# Patient Record
Sex: Male | Born: 1955
Health system: Southern US, Community
[De-identification: ages and names within clinical notes are randomized; demographics above are authoritative.]

## PROBLEM LIST (undated history)

## (undated) DIAGNOSIS — F32A Depression, unspecified: Secondary | ICD-10-CM

## (undated) DIAGNOSIS — F329 Major depressive disorder, single episode, unspecified: Secondary | ICD-10-CM

## (undated) DIAGNOSIS — N401 Enlarged prostate with lower urinary tract symptoms: Secondary | ICD-10-CM

## (undated) DIAGNOSIS — N179 Acute kidney failure, unspecified: Secondary | ICD-10-CM

## (undated) DIAGNOSIS — B1920 Unspecified viral hepatitis C without hepatic coma: Secondary | ICD-10-CM

## (undated) DIAGNOSIS — I639 Cerebral infarction, unspecified: Secondary | ICD-10-CM

## (undated) DIAGNOSIS — K8020 Calculus of gallbladder without cholecystitis without obstruction: Secondary | ICD-10-CM

## (undated) DIAGNOSIS — E119 Type 2 diabetes mellitus without complications: Secondary | ICD-10-CM

## (undated) DIAGNOSIS — Z227 Latent tuberculosis: Secondary | ICD-10-CM

## (undated) DIAGNOSIS — I10 Essential (primary) hypertension: Secondary | ICD-10-CM

## (undated) DIAGNOSIS — J449 Chronic obstructive pulmonary disease, unspecified: Secondary | ICD-10-CM

## (undated) DIAGNOSIS — I739 Peripheral vascular disease, unspecified: Secondary | ICD-10-CM

## (undated) DIAGNOSIS — C61 Malignant neoplasm of prostate: Secondary | ICD-10-CM

## (undated) DIAGNOSIS — B20 Human immunodeficiency virus [HIV] disease: Secondary | ICD-10-CM

## (undated) DIAGNOSIS — Z21 Asymptomatic human immunodeficiency virus [HIV] infection status: Secondary | ICD-10-CM

## (undated) DIAGNOSIS — R937 Abnormal findings on diagnostic imaging of other parts of musculoskeletal system: Secondary | ICD-10-CM

## (undated) DIAGNOSIS — I2699 Other pulmonary embolism without acute cor pulmonale: Secondary | ICD-10-CM

## (undated) DIAGNOSIS — R82998 Other abnormal findings in urine: Secondary | ICD-10-CM

## (undated) HISTORY — DX: Calculus of gallbladder without cholecystitis without obstruction: K80.20

## (undated) HISTORY — DX: Acute kidney failure, unspecified: N17.9

## (undated) HISTORY — DX: Peripheral vascular disease, unspecified: I73.9

## (undated) HISTORY — PX: HERNIA REPAIR: SHX51

## (undated) HISTORY — DX: Type 2 diabetes mellitus without complications: E11.9

## (undated) HISTORY — DX: Other abnormal findings in urine: R82.998

## (undated) HISTORY — DX: Other pulmonary embolism without acute cor pulmonale: I26.99

## (undated) HISTORY — DX: Latent tuberculosis: Z22.7

---
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
## 1986-07-20 DIAGNOSIS — Z227 Latent tuberculosis: Secondary | ICD-10-CM

## 1986-07-20 HISTORY — DX: Latent tuberculosis: Z22.7

## 2003-01-23 ENCOUNTER — Encounter: Payer: Self-pay | Admitting: Internal Medicine

## 2003-01-23 ENCOUNTER — Encounter: Admission: RE | Admit: 2003-01-23 | Discharge: 2003-01-23 | Payer: Self-pay | Admitting: Internal Medicine

## 2003-01-23 ENCOUNTER — Encounter (INDEPENDENT_AMBULATORY_CARE_PROVIDER_SITE_OTHER): Payer: Self-pay | Admitting: *Deleted

## 2003-01-23 ENCOUNTER — Ambulatory Visit (HOSPITAL_COMMUNITY): Admission: RE | Admit: 2003-01-23 | Discharge: 2003-01-23 | Payer: Self-pay | Admitting: Internal Medicine

## 2003-01-23 LAB — CONVERTED CEMR LAB: CD4 T Cell Abs: 590

## 2003-02-13 ENCOUNTER — Encounter: Admission: RE | Admit: 2003-02-13 | Discharge: 2003-02-13 | Payer: Self-pay | Admitting: Internal Medicine

## 2003-05-01 ENCOUNTER — Ambulatory Visit (HOSPITAL_COMMUNITY): Admission: RE | Admit: 2003-05-01 | Discharge: 2003-05-01 | Payer: Self-pay | Admitting: Internal Medicine

## 2003-05-01 ENCOUNTER — Encounter: Admission: RE | Admit: 2003-05-01 | Discharge: 2003-05-01 | Payer: Self-pay | Admitting: Internal Medicine

## 2003-05-01 ENCOUNTER — Encounter: Payer: Self-pay | Admitting: Internal Medicine

## 2003-05-15 ENCOUNTER — Encounter: Admission: RE | Admit: 2003-05-15 | Discharge: 2003-05-15 | Payer: Self-pay | Admitting: Internal Medicine

## 2003-07-31 ENCOUNTER — Encounter: Admission: RE | Admit: 2003-07-31 | Discharge: 2003-07-31 | Payer: Self-pay | Admitting: Internal Medicine

## 2003-08-14 ENCOUNTER — Encounter: Admission: RE | Admit: 2003-08-14 | Discharge: 2003-08-14 | Payer: Self-pay | Admitting: Internal Medicine

## 2003-09-04 ENCOUNTER — Inpatient Hospital Stay (HOSPITAL_COMMUNITY): Admission: EM | Admit: 2003-09-04 | Discharge: 2003-09-08 | Payer: Self-pay | Admitting: *Deleted

## 2003-09-11 ENCOUNTER — Encounter: Admission: RE | Admit: 2003-09-11 | Discharge: 2003-09-11 | Payer: Self-pay | Admitting: Internal Medicine

## 2003-09-25 ENCOUNTER — Encounter: Admission: RE | Admit: 2003-09-25 | Discharge: 2003-09-25 | Payer: Self-pay | Admitting: Internal Medicine

## 2003-10-04 ENCOUNTER — Encounter: Admission: RE | Admit: 2003-10-04 | Discharge: 2003-10-04 | Payer: Self-pay | Admitting: Internal Medicine

## 2003-11-15 ENCOUNTER — Encounter: Admission: RE | Admit: 2003-11-15 | Discharge: 2003-11-15 | Payer: Self-pay | Admitting: Internal Medicine

## 2003-11-15 ENCOUNTER — Ambulatory Visit (HOSPITAL_COMMUNITY): Admission: RE | Admit: 2003-11-15 | Discharge: 2003-11-15 | Payer: Self-pay | Admitting: Internal Medicine

## 2003-12-04 ENCOUNTER — Encounter: Admission: RE | Admit: 2003-12-04 | Discharge: 2003-12-04 | Payer: Self-pay | Admitting: Internal Medicine

## 2004-02-19 ENCOUNTER — Ambulatory Visit (HOSPITAL_COMMUNITY): Admission: RE | Admit: 2004-02-19 | Discharge: 2004-02-19 | Payer: Self-pay | Admitting: Internal Medicine

## 2004-02-19 ENCOUNTER — Encounter: Admission: RE | Admit: 2004-02-19 | Discharge: 2004-02-19 | Payer: Self-pay | Admitting: Internal Medicine

## 2004-03-04 ENCOUNTER — Encounter: Admission: RE | Admit: 2004-03-04 | Discharge: 2004-03-04 | Payer: Self-pay | Admitting: Internal Medicine

## 2004-05-20 ENCOUNTER — Ambulatory Visit (HOSPITAL_COMMUNITY): Admission: RE | Admit: 2004-05-20 | Discharge: 2004-05-20 | Payer: Self-pay | Admitting: Internal Medicine

## 2004-05-20 ENCOUNTER — Ambulatory Visit: Payer: Self-pay | Admitting: Internal Medicine

## 2004-07-23 ENCOUNTER — Ambulatory Visit: Payer: Self-pay | Admitting: Internal Medicine

## 2004-08-05 ENCOUNTER — Ambulatory Visit (HOSPITAL_COMMUNITY): Admission: RE | Admit: 2004-08-05 | Discharge: 2004-08-05 | Payer: Self-pay | Admitting: Internal Medicine

## 2004-08-05 ENCOUNTER — Ambulatory Visit: Payer: Self-pay | Admitting: Internal Medicine

## 2004-10-21 ENCOUNTER — Ambulatory Visit: Payer: Self-pay | Admitting: Internal Medicine

## 2004-10-21 ENCOUNTER — Ambulatory Visit (HOSPITAL_COMMUNITY): Admission: RE | Admit: 2004-10-21 | Discharge: 2004-10-21 | Payer: Self-pay | Admitting: Internal Medicine

## 2004-11-04 ENCOUNTER — Ambulatory Visit: Payer: Self-pay | Admitting: Internal Medicine

## 2005-03-31 ENCOUNTER — Ambulatory Visit (HOSPITAL_COMMUNITY): Admission: RE | Admit: 2005-03-31 | Discharge: 2005-03-31 | Payer: Self-pay | Admitting: Internal Medicine

## 2005-03-31 ENCOUNTER — Encounter (INDEPENDENT_AMBULATORY_CARE_PROVIDER_SITE_OTHER): Payer: Self-pay | Admitting: *Deleted

## 2005-03-31 ENCOUNTER — Ambulatory Visit: Payer: Self-pay | Admitting: Internal Medicine

## 2005-03-31 LAB — CONVERTED CEMR LAB: CD4 Count: 540 microliters

## 2005-04-14 ENCOUNTER — Ambulatory Visit: Payer: Self-pay | Admitting: Internal Medicine

## 2005-05-20 ENCOUNTER — Ambulatory Visit: Payer: Self-pay | Admitting: Internal Medicine

## 2005-10-06 ENCOUNTER — Encounter: Admission: RE | Admit: 2005-10-06 | Discharge: 2005-10-06 | Payer: Self-pay | Admitting: Internal Medicine

## 2005-10-06 ENCOUNTER — Ambulatory Visit: Payer: Self-pay | Admitting: Internal Medicine

## 2005-10-06 ENCOUNTER — Encounter (INDEPENDENT_AMBULATORY_CARE_PROVIDER_SITE_OTHER): Payer: Self-pay | Admitting: *Deleted

## 2005-10-06 LAB — CONVERTED CEMR LAB: HIV 1 RNA Quant: 399 copies/mL

## 2006-04-12 ENCOUNTER — Encounter (INDEPENDENT_AMBULATORY_CARE_PROVIDER_SITE_OTHER): Payer: Self-pay | Admitting: *Deleted

## 2006-04-12 ENCOUNTER — Ambulatory Visit: Payer: Self-pay | Admitting: Internal Medicine

## 2006-04-12 ENCOUNTER — Encounter: Admission: RE | Admit: 2006-04-12 | Discharge: 2006-04-12 | Payer: Self-pay | Admitting: Internal Medicine

## 2006-04-27 ENCOUNTER — Ambulatory Visit: Payer: Self-pay | Admitting: Internal Medicine

## 2006-04-27 DIAGNOSIS — F17210 Nicotine dependence, cigarettes, uncomplicated: Secondary | ICD-10-CM | POA: Insufficient documentation

## 2006-04-27 DIAGNOSIS — M1990 Unspecified osteoarthritis, unspecified site: Secondary | ICD-10-CM | POA: Insufficient documentation

## 2006-04-27 DIAGNOSIS — A150 Tuberculosis of lung: Secondary | ICD-10-CM | POA: Insufficient documentation

## 2006-04-27 DIAGNOSIS — M5137 Other intervertebral disc degeneration, lumbosacral region: Secondary | ICD-10-CM | POA: Insufficient documentation

## 2006-04-27 DIAGNOSIS — F339 Major depressive disorder, recurrent, unspecified: Secondary | ICD-10-CM

## 2006-04-27 DIAGNOSIS — I10 Essential (primary) hypertension: Secondary | ICD-10-CM | POA: Insufficient documentation

## 2006-04-27 DIAGNOSIS — B182 Chronic viral hepatitis C: Secondary | ICD-10-CM

## 2006-04-27 DIAGNOSIS — B20 Human immunodeficiency virus [HIV] disease: Secondary | ICD-10-CM | POA: Insufficient documentation

## 2006-04-27 DIAGNOSIS — F1910 Other psychoactive substance abuse, uncomplicated: Secondary | ICD-10-CM

## 2006-07-22 ENCOUNTER — Encounter: Payer: Self-pay | Admitting: Internal Medicine

## 2006-09-13 ENCOUNTER — Encounter (INDEPENDENT_AMBULATORY_CARE_PROVIDER_SITE_OTHER): Payer: Self-pay | Admitting: *Deleted

## 2006-09-13 LAB — CONVERTED CEMR LAB

## 2006-09-26 ENCOUNTER — Encounter (INDEPENDENT_AMBULATORY_CARE_PROVIDER_SITE_OTHER): Payer: Self-pay | Admitting: *Deleted

## 2006-10-21 ENCOUNTER — Telehealth: Payer: Self-pay | Admitting: Internal Medicine

## 2006-10-26 ENCOUNTER — Ambulatory Visit: Payer: Self-pay | Admitting: Internal Medicine

## 2006-10-26 ENCOUNTER — Encounter: Admission: RE | Admit: 2006-10-26 | Discharge: 2006-10-26 | Payer: Self-pay | Admitting: Internal Medicine

## 2006-11-02 DIAGNOSIS — A158 Other respiratory tuberculosis: Secondary | ICD-10-CM | POA: Insufficient documentation

## 2006-11-09 ENCOUNTER — Ambulatory Visit: Payer: Self-pay | Admitting: Internal Medicine

## 2006-11-09 LAB — CONVERTED CEMR LAB: Blood Glucose, Fingerstick: 100

## 2006-11-23 ENCOUNTER — Telehealth: Payer: Self-pay | Admitting: Internal Medicine

## 2006-12-08 ENCOUNTER — Telehealth: Payer: Self-pay | Admitting: Internal Medicine

## 2006-12-22 ENCOUNTER — Telehealth: Payer: Self-pay | Admitting: Internal Medicine

## 2007-01-20 ENCOUNTER — Telehealth: Payer: Self-pay | Admitting: Internal Medicine

## 2007-02-09 ENCOUNTER — Telehealth: Payer: Self-pay | Admitting: Internal Medicine

## 2007-03-14 ENCOUNTER — Telehealth: Payer: Self-pay | Admitting: Internal Medicine

## 2007-04-15 ENCOUNTER — Telehealth: Payer: Self-pay | Admitting: Internal Medicine

## 2007-05-03 ENCOUNTER — Telehealth: Payer: Self-pay | Admitting: Internal Medicine

## 2007-05-16 ENCOUNTER — Telehealth: Payer: Self-pay | Admitting: Internal Medicine

## 2007-05-31 ENCOUNTER — Encounter: Admission: RE | Admit: 2007-05-31 | Discharge: 2007-05-31 | Payer: Self-pay | Admitting: Internal Medicine

## 2007-05-31 ENCOUNTER — Ambulatory Visit: Payer: Self-pay | Admitting: Internal Medicine

## 2007-05-31 ENCOUNTER — Encounter: Payer: Self-pay | Admitting: Infectious Diseases

## 2007-05-31 LAB — CONVERTED CEMR LAB
ALT: 39 units/L (ref 0–53)
AST: 54 units/L — ABNORMAL HIGH (ref 0–37)
Creatinine, Ser: 0.83 mg/dL (ref 0.40–1.50)
HIV 1 RNA Quant: 815 copies/mL — ABNORMAL HIGH (ref <50)
Hgb A1c MFr Bld: 6.3 %
Total Bilirubin: 0.4 mg/dL (ref 0.3–1.2)

## 2007-06-14 ENCOUNTER — Telehealth (INDEPENDENT_AMBULATORY_CARE_PROVIDER_SITE_OTHER): Payer: Self-pay | Admitting: Pharmacy Technician

## 2007-06-14 ENCOUNTER — Telehealth: Payer: Self-pay | Admitting: Internal Medicine

## 2007-06-14 ENCOUNTER — Ambulatory Visit: Payer: Self-pay | Admitting: Internal Medicine

## 2007-06-14 DIAGNOSIS — G4700 Insomnia, unspecified: Secondary | ICD-10-CM | POA: Insufficient documentation

## 2007-06-22 ENCOUNTER — Telehealth: Payer: Self-pay | Admitting: Internal Medicine

## 2007-07-12 ENCOUNTER — Telehealth: Payer: Self-pay | Admitting: Internal Medicine

## 2007-07-20 ENCOUNTER — Encounter (INDEPENDENT_AMBULATORY_CARE_PROVIDER_SITE_OTHER): Payer: Self-pay | Admitting: *Deleted

## 2007-07-27 ENCOUNTER — Ambulatory Visit: Payer: Self-pay | Admitting: Internal Medicine

## 2007-07-27 ENCOUNTER — Encounter: Admission: RE | Admit: 2007-07-27 | Discharge: 2007-07-27 | Payer: Self-pay | Admitting: Internal Medicine

## 2007-07-27 LAB — CONVERTED CEMR LAB
Albumin: 4.1 g/dL (ref 3.5–5.2)
Alkaline Phosphatase: 119 units/L — ABNORMAL HIGH (ref 39–117)
BUN: 11 mg/dL (ref 6–23)
Cholesterol: 147 mg/dL (ref 0–200)
Glucose, Bld: 105 mg/dL — ABNORMAL HIGH (ref 70–99)
HDL: 46 mg/dL (ref >39)
Hemoglobin: 13.6 g/dL (ref 13.0–17.0)
LDL Cholesterol: 55 mg/dL (ref 0–99)
MCHC: 33.2 g/dL (ref 30.0–36.0)
MCV: 86.3 fL (ref 78.0–100.0)
Potassium: 3.4 meq/L — ABNORMAL LOW (ref 3.5–5.3)
RBC: 4.75 M/uL (ref 4.22–5.81)
Triglycerides: 230 mg/dL — ABNORMAL HIGH (ref <150)

## 2007-08-11 ENCOUNTER — Telehealth: Payer: Self-pay | Admitting: Internal Medicine

## 2007-08-15 ENCOUNTER — Telehealth: Payer: Self-pay | Admitting: Internal Medicine

## 2007-08-16 ENCOUNTER — Ambulatory Visit: Payer: Self-pay | Admitting: Internal Medicine

## 2007-09-12 ENCOUNTER — Telehealth: Payer: Self-pay | Admitting: Internal Medicine

## 2007-10-13 ENCOUNTER — Encounter (INDEPENDENT_AMBULATORY_CARE_PROVIDER_SITE_OTHER): Payer: Self-pay | Admitting: *Deleted

## 2007-10-17 ENCOUNTER — Telehealth (INDEPENDENT_AMBULATORY_CARE_PROVIDER_SITE_OTHER): Payer: Self-pay | Admitting: *Deleted

## 2007-10-24 ENCOUNTER — Ambulatory Visit: Payer: Self-pay | Admitting: Internal Medicine

## 2007-10-24 ENCOUNTER — Encounter: Admission: RE | Admit: 2007-10-24 | Discharge: 2007-10-24 | Payer: Self-pay | Admitting: Internal Medicine

## 2007-10-24 LAB — CONVERTED CEMR LAB
Alkaline Phosphatase: 98 units/L (ref 39–117)
BUN: 12 mg/dL (ref 6–23)
CO2: 21 meq/L (ref 19–32)
Creatinine, Ser: 0.82 mg/dL (ref 0.40–1.50)
Eosinophils Absolute: 0.4 10*3/uL (ref 0.0–0.7)
Eosinophils Relative: 6 % — ABNORMAL HIGH (ref 0–5)
Glucose, Bld: 106 mg/dL — ABNORMAL HIGH (ref 70–99)
HCT: 39.7 % (ref 39.0–52.0)
HIV 1 RNA Quant: 634 copies/mL — ABNORMAL HIGH (ref <50)
HIV-1 RNA Quant, Log: 2.8 — ABNORMAL HIGH (ref <1.70)
Hemoglobin: 13.3 g/dL (ref 13.0–17.0)
Lymphs Abs: 3 10*3/uL (ref 0.7–4.0)
MCV: 85.2 fL (ref 78.0–100.0)
Monocytes Absolute: 0.6 10*3/uL (ref 0.1–1.0)
Monocytes Relative: 10 % (ref 3–12)
Platelets: 210 10*3/uL (ref 150–400)
RBC: 4.66 M/uL (ref 4.22–5.81)
Total Bilirubin: 0.4 mg/dL (ref 0.3–1.2)
WBC: 6 10*3/uL (ref 4.0–10.5)

## 2007-11-02 ENCOUNTER — Encounter (INDEPENDENT_AMBULATORY_CARE_PROVIDER_SITE_OTHER): Payer: Self-pay | Admitting: *Deleted

## 2007-11-14 ENCOUNTER — Telehealth (INDEPENDENT_AMBULATORY_CARE_PROVIDER_SITE_OTHER): Payer: Self-pay | Admitting: *Deleted

## 2007-11-15 ENCOUNTER — Ambulatory Visit: Payer: Self-pay | Admitting: Internal Medicine

## 2007-11-17 ENCOUNTER — Telehealth (INDEPENDENT_AMBULATORY_CARE_PROVIDER_SITE_OTHER): Payer: Self-pay | Admitting: *Deleted

## 2007-12-14 ENCOUNTER — Telehealth (INDEPENDENT_AMBULATORY_CARE_PROVIDER_SITE_OTHER): Payer: Self-pay | Admitting: *Deleted

## 2008-01-09 ENCOUNTER — Telehealth (INDEPENDENT_AMBULATORY_CARE_PROVIDER_SITE_OTHER): Payer: Self-pay | Admitting: *Deleted

## 2008-01-11 ENCOUNTER — Encounter: Admission: RE | Admit: 2008-01-11 | Discharge: 2008-01-11 | Payer: Self-pay | Admitting: Internal Medicine

## 2008-01-11 ENCOUNTER — Ambulatory Visit: Payer: Self-pay | Admitting: Internal Medicine

## 2008-01-11 LAB — CONVERTED CEMR LAB
AST: 35 units/L (ref 0–37)
Albumin: 4 g/dL (ref 3.5–5.2)
Alkaline Phosphatase: 70 units/L (ref 39–117)
BUN: 14 mg/dL (ref 6–23)
Calcium: 9.4 mg/dL (ref 8.4–10.5)
Chloride: 106 meq/L (ref 96–112)
Glucose, Bld: 95 mg/dL (ref 70–99)
Hemoglobin: 11.6 g/dL — ABNORMAL LOW (ref 13.0–17.0)
Potassium: 3.7 meq/L (ref 3.5–5.3)
RBC: 3.93 M/uL — ABNORMAL LOW (ref 4.22–5.81)
RDW: 18.9 % — ABNORMAL HIGH (ref 11.5–15.5)
Sodium: 140 meq/L (ref 135–145)
Total Protein: 7.3 g/dL (ref 6.0–8.3)

## 2008-01-24 ENCOUNTER — Ambulatory Visit: Payer: Self-pay | Admitting: Internal Medicine

## 2008-01-24 DIAGNOSIS — M79609 Pain in unspecified limb: Secondary | ICD-10-CM

## 2008-03-13 ENCOUNTER — Telehealth (INDEPENDENT_AMBULATORY_CARE_PROVIDER_SITE_OTHER): Payer: Self-pay | Admitting: *Deleted

## 2008-04-05 ENCOUNTER — Telehealth (INDEPENDENT_AMBULATORY_CARE_PROVIDER_SITE_OTHER): Payer: Self-pay | Admitting: *Deleted

## 2008-04-09 ENCOUNTER — Telehealth (INDEPENDENT_AMBULATORY_CARE_PROVIDER_SITE_OTHER): Payer: Self-pay | Admitting: *Deleted

## 2008-04-16 ENCOUNTER — Emergency Department (HOSPITAL_COMMUNITY): Admission: EM | Admit: 2008-04-16 | Discharge: 2008-04-16 | Payer: Self-pay | Admitting: Emergency Medicine

## 2008-04-24 ENCOUNTER — Ambulatory Visit: Payer: Self-pay | Admitting: Internal Medicine

## 2008-04-24 LAB — CONVERTED CEMR LAB: HIV-1 RNA Quant, Log: <1.7 (ref <1.70)

## 2008-04-25 ENCOUNTER — Ambulatory Visit: Payer: Self-pay | Admitting: Internal Medicine

## 2008-04-25 ENCOUNTER — Ambulatory Visit (HOSPITAL_COMMUNITY): Admission: RE | Admit: 2008-04-25 | Discharge: 2008-04-25 | Payer: Self-pay | Admitting: Internal Medicine

## 2008-04-25 DIAGNOSIS — F528 Other sexual dysfunction not due to a substance or known physiological condition: Secondary | ICD-10-CM

## 2008-04-26 LAB — CONVERTED CEMR LAB: Testosterone: 827.56 ng/dL (ref 350–890)

## 2008-05-03 ENCOUNTER — Telehealth: Payer: Self-pay | Admitting: Internal Medicine

## 2008-05-10 ENCOUNTER — Telehealth (INDEPENDENT_AMBULATORY_CARE_PROVIDER_SITE_OTHER): Payer: Self-pay | Admitting: *Deleted

## 2008-05-17 ENCOUNTER — Ambulatory Visit: Payer: Self-pay | Admitting: Internal Medicine

## 2008-05-22 ENCOUNTER — Encounter (INDEPENDENT_AMBULATORY_CARE_PROVIDER_SITE_OTHER): Payer: Self-pay | Admitting: Licensed Clinical Social Worker

## 2008-05-24 ENCOUNTER — Ambulatory Visit: Payer: Self-pay | Admitting: Internal Medicine

## 2008-05-24 LAB — CONVERTED CEMR LAB
ALT: 23 units/L (ref 0–53)
AST: 28 units/L (ref 0–37)
Albumin: 3.8 g/dL (ref 3.5–5.2)
Alkaline Phosphatase: 74 units/L (ref 39–117)
Glucose, Bld: 87 mg/dL (ref 70–99)
Potassium: 3.5 meq/L (ref 3.5–5.3)
Sodium: 140 meq/L (ref 135–145)
Total Bilirubin: 0.4 mg/dL (ref 0.3–1.2)
Total Protein: 7.7 g/dL (ref 6.0–8.3)

## 2008-05-25 ENCOUNTER — Ambulatory Visit (HOSPITAL_COMMUNITY): Admission: RE | Admit: 2008-05-25 | Discharge: 2008-05-25 | Payer: Self-pay | Admitting: Internal Medicine

## 2008-05-30 ENCOUNTER — Telehealth (INDEPENDENT_AMBULATORY_CARE_PROVIDER_SITE_OTHER): Payer: Self-pay | Admitting: *Deleted

## 2008-06-11 ENCOUNTER — Telehealth (INDEPENDENT_AMBULATORY_CARE_PROVIDER_SITE_OTHER): Payer: Self-pay | Admitting: *Deleted

## 2008-06-12 ENCOUNTER — Telehealth: Payer: Self-pay | Admitting: Internal Medicine

## 2008-06-28 ENCOUNTER — Telehealth (INDEPENDENT_AMBULATORY_CARE_PROVIDER_SITE_OTHER): Payer: Self-pay | Admitting: *Deleted

## 2008-06-29 ENCOUNTER — Telehealth (INDEPENDENT_AMBULATORY_CARE_PROVIDER_SITE_OTHER): Payer: Self-pay | Admitting: *Deleted

## 2008-07-04 ENCOUNTER — Ambulatory Visit (HOSPITAL_COMMUNITY): Admission: RE | Admit: 2008-07-04 | Discharge: 2008-07-04 | Payer: Self-pay | Admitting: Internal Medicine

## 2008-07-04 ENCOUNTER — Ambulatory Visit: Payer: Self-pay | Admitting: Internal Medicine

## 2008-07-09 ENCOUNTER — Telehealth (INDEPENDENT_AMBULATORY_CARE_PROVIDER_SITE_OTHER): Payer: Self-pay | Admitting: *Deleted

## 2008-07-31 ENCOUNTER — Telehealth (INDEPENDENT_AMBULATORY_CARE_PROVIDER_SITE_OTHER): Payer: Self-pay | Admitting: *Deleted

## 2008-08-09 ENCOUNTER — Ambulatory Visit: Payer: Self-pay | Admitting: Internal Medicine

## 2008-08-13 ENCOUNTER — Telehealth: Payer: Self-pay | Admitting: Internal Medicine

## 2008-08-15 ENCOUNTER — Telehealth (INDEPENDENT_AMBULATORY_CARE_PROVIDER_SITE_OTHER): Payer: Self-pay | Admitting: *Deleted

## 2008-09-06 ENCOUNTER — Telehealth (INDEPENDENT_AMBULATORY_CARE_PROVIDER_SITE_OTHER): Payer: Self-pay | Admitting: *Deleted

## 2008-10-09 ENCOUNTER — Telehealth (INDEPENDENT_AMBULATORY_CARE_PROVIDER_SITE_OTHER): Payer: Self-pay | Admitting: *Deleted

## 2008-10-23 ENCOUNTER — Telehealth (INDEPENDENT_AMBULATORY_CARE_PROVIDER_SITE_OTHER): Payer: Self-pay | Admitting: *Deleted

## 2008-10-29 ENCOUNTER — Ambulatory Visit: Payer: Self-pay | Admitting: Internal Medicine

## 2008-10-29 LAB — CONVERTED CEMR LAB
Alkaline Phosphatase: 78 units/L (ref 39–117)
BUN: 10 mg/dL (ref 6–23)
Basophils Absolute: 0 10*3/uL (ref 0.0–0.1)
CO2: 22 meq/L (ref 19–32)
Cholesterol: 132 mg/dL (ref 0–200)
Creatinine, Ser: 0.86 mg/dL (ref 0.40–1.50)
GFR calc Af Amer: >60 mL/min (ref >60)
GFR calc non Af Amer: >60 mL/min (ref >60)
Glucose, Bld: 124 mg/dL — ABNORMAL HIGH (ref 70–99)
HCT: 34 % — ABNORMAL LOW (ref 39.0–52.0)
HDL: 34 mg/dL — ABNORMAL LOW (ref >39)
HIV-1 RNA Quant, Log: 1.86 — ABNORMAL HIGH (ref <1.68)
Hemoglobin: 11.5 g/dL — ABNORMAL LOW (ref 13.0–17.0)
Lymphocytes Relative: 47 % — ABNORMAL HIGH (ref 12–46)
Lymphs Abs: 3.1 10*3/uL (ref 0.7–4.0)
Monocytes Absolute: 0.5 10*3/uL (ref 0.1–1.0)
Neutro Abs: 2.7 10*3/uL (ref 1.7–7.7)
RDW: 14.6 % (ref 11.5–15.5)
Sodium: 139 meq/L (ref 135–145)
Total Bilirubin: 0.3 mg/dL (ref 0.3–1.2)
Total Protein: 7.3 g/dL (ref 6.0–8.3)
Triglycerides: 110 mg/dL (ref <150)
VLDL: 22 mg/dL (ref 0–40)
WBC: 6.7 10*3/uL (ref 4.0–10.5)

## 2008-10-30 ENCOUNTER — Encounter (INDEPENDENT_AMBULATORY_CARE_PROVIDER_SITE_OTHER): Payer: Self-pay | Admitting: *Deleted

## 2008-11-05 ENCOUNTER — Telehealth (INDEPENDENT_AMBULATORY_CARE_PROVIDER_SITE_OTHER): Payer: Self-pay | Admitting: *Deleted

## 2008-11-20 ENCOUNTER — Encounter (INDEPENDENT_AMBULATORY_CARE_PROVIDER_SITE_OTHER): Payer: Self-pay | Admitting: *Deleted

## 2008-11-26 ENCOUNTER — Telehealth (INDEPENDENT_AMBULATORY_CARE_PROVIDER_SITE_OTHER): Payer: Self-pay | Admitting: *Deleted

## 2008-12-24 ENCOUNTER — Telehealth (INDEPENDENT_AMBULATORY_CARE_PROVIDER_SITE_OTHER): Payer: Self-pay | Admitting: *Deleted

## 2008-12-25 ENCOUNTER — Ambulatory Visit: Payer: Self-pay | Admitting: Internal Medicine

## 2008-12-25 DIAGNOSIS — L989 Disorder of the skin and subcutaneous tissue, unspecified: Secondary | ICD-10-CM | POA: Insufficient documentation

## 2008-12-25 LAB — CONVERTED CEMR LAB
Bilirubin Urine: NEGATIVE
GFR calc Af Amer: >60 mL/min (ref >60)
GFR calc non Af Amer: >60 mL/min (ref >60)
Hgb A1c MFr Bld: 5.8 %
Ketones, urine, test strip: NEGATIVE
Potassium: 3.8 meq/L (ref 3.5–5.3)
Sodium: 140 meq/L (ref 135–145)
Specific Gravity, Urine: 1.02

## 2009-01-24 ENCOUNTER — Telehealth (INDEPENDENT_AMBULATORY_CARE_PROVIDER_SITE_OTHER): Payer: Self-pay | Admitting: *Deleted

## 2009-01-29 ENCOUNTER — Emergency Department (HOSPITAL_COMMUNITY): Admission: EM | Admit: 2009-01-29 | Discharge: 2009-01-29 | Payer: Self-pay | Admitting: Emergency Medicine

## 2009-02-11 ENCOUNTER — Ambulatory Visit: Payer: Self-pay | Admitting: Internal Medicine

## 2009-02-11 LAB — CONVERTED CEMR LAB
Chloride: 108 meq/L (ref 96–112)
Creatinine, Ser: 0.99 mg/dL (ref 0.40–1.50)
HIV-1 RNA Quant, Log: <1.68 (ref <1.68)
Potassium: 3.6 meq/L (ref 3.5–5.3)
Sodium: 141 meq/L (ref 135–145)

## 2009-02-21 ENCOUNTER — Telehealth (INDEPENDENT_AMBULATORY_CARE_PROVIDER_SITE_OTHER): Payer: Self-pay | Admitting: *Deleted

## 2009-02-28 ENCOUNTER — Telehealth (INDEPENDENT_AMBULATORY_CARE_PROVIDER_SITE_OTHER): Payer: Self-pay | Admitting: *Deleted

## 2009-03-28 ENCOUNTER — Telehealth (INDEPENDENT_AMBULATORY_CARE_PROVIDER_SITE_OTHER): Payer: Self-pay | Admitting: *Deleted

## 2009-04-24 ENCOUNTER — Telehealth (INDEPENDENT_AMBULATORY_CARE_PROVIDER_SITE_OTHER): Payer: Self-pay | Admitting: *Deleted

## 2009-04-29 ENCOUNTER — Telehealth (INDEPENDENT_AMBULATORY_CARE_PROVIDER_SITE_OTHER): Payer: Self-pay | Admitting: *Deleted

## 2009-05-07 ENCOUNTER — Telehealth (INDEPENDENT_AMBULATORY_CARE_PROVIDER_SITE_OTHER): Payer: Self-pay | Admitting: *Deleted

## 2009-05-21 ENCOUNTER — Telehealth (INDEPENDENT_AMBULATORY_CARE_PROVIDER_SITE_OTHER): Payer: Self-pay | Admitting: *Deleted

## 2009-06-11 ENCOUNTER — Telehealth (INDEPENDENT_AMBULATORY_CARE_PROVIDER_SITE_OTHER): Payer: Self-pay | Admitting: *Deleted

## 2009-06-21 ENCOUNTER — Telehealth (INDEPENDENT_AMBULATORY_CARE_PROVIDER_SITE_OTHER): Payer: Self-pay | Admitting: *Deleted

## 2009-07-18 ENCOUNTER — Telehealth (INDEPENDENT_AMBULATORY_CARE_PROVIDER_SITE_OTHER): Payer: Self-pay | Admitting: *Deleted

## 2009-07-24 ENCOUNTER — Ambulatory Visit: Payer: Self-pay | Admitting: Internal Medicine

## 2009-07-24 LAB — CONVERTED CEMR LAB
AST: 44 units/L — ABNORMAL HIGH (ref 0–37)
Albumin: 3.9 g/dL (ref 3.5–5.2)
Alkaline Phosphatase: 75 units/L (ref 39–117)
Basophils Absolute: 0 10*3/uL (ref 0.0–0.1)
Glucose, Bld: 79 mg/dL (ref 70–99)
HIV-1 RNA Quant, Log: <1.68 (ref <1.68)
Hemoglobin: 11.7 g/dL — ABNORMAL LOW (ref 13.0–17.0)
Hgb A1c MFr Bld: 5.8 %
LDL Cholesterol: 62 mg/dL (ref 0–99)
Lymphocytes Relative: 44 % (ref 12–46)
Lymphs Abs: 3.5 10*3/uL (ref 0.7–4.0)
Monocytes Absolute: 0.6 10*3/uL (ref 0.1–1.0)
Neutro Abs: 3.6 10*3/uL (ref 1.7–7.7)
Potassium: 3.4 meq/L — ABNORMAL LOW (ref 3.5–5.3)
RBC: 3.62 M/uL — ABNORMAL LOW (ref 4.22–5.81)
RDW: 14.9 % (ref 11.5–15.5)
Sodium: 141 meq/L (ref 135–145)
Total Protein: 7.1 g/dL (ref 6.0–8.3)
Triglycerides: 158 mg/dL — ABNORMAL HIGH (ref <150)
WBC: 8.1 10*3/uL (ref 4.0–10.5)

## 2009-08-13 ENCOUNTER — Ambulatory Visit: Payer: Self-pay | Admitting: Internal Medicine

## 2009-08-13 DIAGNOSIS — E119 Type 2 diabetes mellitus without complications: Secondary | ICD-10-CM | POA: Insufficient documentation

## 2009-08-14 ENCOUNTER — Telehealth (INDEPENDENT_AMBULATORY_CARE_PROVIDER_SITE_OTHER): Payer: Self-pay | Admitting: *Deleted

## 2009-09-03 ENCOUNTER — Ambulatory Visit: Payer: Self-pay | Admitting: Internal Medicine

## 2009-09-17 ENCOUNTER — Telehealth (INDEPENDENT_AMBULATORY_CARE_PROVIDER_SITE_OTHER): Payer: Self-pay | Admitting: *Deleted

## 2009-10-11 ENCOUNTER — Telehealth (INDEPENDENT_AMBULATORY_CARE_PROVIDER_SITE_OTHER): Payer: Self-pay | Admitting: *Deleted

## 2009-10-25 ENCOUNTER — Emergency Department (HOSPITAL_COMMUNITY): Admission: EM | Admit: 2009-10-25 | Discharge: 2009-10-25 | Payer: Self-pay | Admitting: Emergency Medicine

## 2009-10-25 ENCOUNTER — Emergency Department (HOSPITAL_COMMUNITY): Admission: EM | Admit: 2009-10-25 | Discharge: 2009-10-25 | Payer: Self-pay | Admitting: Family Medicine

## 2009-12-31 ENCOUNTER — Ambulatory Visit: Payer: Self-pay | Admitting: Internal Medicine

## 2009-12-31 LAB — CONVERTED CEMR LAB: HIV 1 RNA Quant: <48 copies/mL (ref <48)

## 2010-03-03 ENCOUNTER — Telehealth: Payer: Self-pay | Admitting: Internal Medicine

## 2010-05-08 ENCOUNTER — Ambulatory Visit: Payer: Self-pay | Admitting: Internal Medicine

## 2010-05-29 ENCOUNTER — Ambulatory Visit: Payer: Self-pay | Admitting: Internal Medicine

## 2010-06-11 ENCOUNTER — Encounter (INDEPENDENT_AMBULATORY_CARE_PROVIDER_SITE_OTHER): Payer: Self-pay | Admitting: *Deleted

## 2010-06-16 ENCOUNTER — Encounter (INDEPENDENT_AMBULATORY_CARE_PROVIDER_SITE_OTHER): Payer: Self-pay | Admitting: *Deleted

## 2010-06-21 ENCOUNTER — Emergency Department (HOSPITAL_COMMUNITY)
Admission: EM | Admit: 2010-06-21 | Discharge: 2010-06-21 | Payer: Self-pay | Source: Home / Self Care | Admitting: Emergency Medicine

## 2010-06-23 ENCOUNTER — Telehealth: Payer: Self-pay

## 2010-07-03 ENCOUNTER — Ambulatory Visit: Payer: Self-pay | Admitting: Internal Medicine

## 2010-08-14 ENCOUNTER — Encounter: Payer: Self-pay | Admitting: Internal Medicine

## 2010-08-14 ENCOUNTER — Ambulatory Visit
Admission: RE | Admit: 2010-08-14 | Discharge: 2010-08-14 | Payer: Self-pay | Source: Home / Self Care | Attending: Internal Medicine | Admitting: Internal Medicine

## 2010-08-14 LAB — CONVERTED CEMR LAB
BUN: 13 mg/dL (ref 6–23)
CO2: 19 meq/L (ref 19–32)
Creatinine, Ser: 1.41 mg/dL (ref 0.40–1.50)
Eosinophils Absolute: 0.3 10*3/uL (ref 0.0–0.7)
Eosinophils Relative: 4 % (ref 0–5)
Glucose, Bld: 136 mg/dL — ABNORMAL HIGH (ref 70–99)
HCT: 32.5 % — ABNORMAL LOW (ref 39.0–52.0)
HIV-1 RNA Quant, Log: 2.51 — ABNORMAL HIGH (ref <1.30)
LDL Cholesterol: 44 mg/dL (ref 0–99)
Lymphs Abs: 2.5 10*3/uL (ref 0.7–4.0)
MCV: 83.3 fL (ref 78.0–100.0)
Monocytes Relative: 8 % (ref 3–12)
Neutrophils Relative %: 48 % (ref 43–77)
RBC: 3.9 M/uL — ABNORMAL LOW (ref 4.22–5.81)
Total Bilirubin: 0.3 mg/dL (ref 0.3–1.2)
Total CHOL/HDL Ratio: 3
Triglycerides: 158 mg/dL — ABNORMAL HIGH (ref <150)
VLDL: 32 mg/dL (ref 0–40)
WBC: 6.3 10*3/uL (ref 4.0–10.5)

## 2010-08-17 LAB — CONVERTED CEMR LAB
ALT: 45 units/L (ref 0–53)
Basophils Absolute: 0 10*3/uL (ref 0.0–0.1)
CD4 Count: 720 microliters
CO2: 20 meq/L (ref 19–32)
Calcium: 9.4 mg/dL (ref 8.4–10.5)
Chloride: 104 meq/L (ref 96–112)
Cholesterol: 175 mg/dL (ref 0–200)
Creatinine, Ser: 0.88 mg/dL (ref 0.40–1.50)
HIV 1 RNA Quant: 168 copies/mL — ABNORMAL HIGH (ref <50)
HIV-1 RNA Quant, Log: 2.23 — ABNORMAL HIGH (ref <1.70)
Hemoglobin: 14.9 g/dL (ref 13.0–17.0)
Hgb A1c MFr Bld: 6 %
Lymphocytes Relative: 38 % (ref 12–46)
Monocytes Absolute: 0.6 10*3/uL (ref 0.2–0.7)
Neutro Abs: 4 10*3/uL (ref 1.7–7.7)
RDW: 13.8 % (ref 11.5–14.0)
Sodium: 140 meq/L (ref 135–145)
Total Protein: 7.7 g/dL (ref 6.0–8.3)
WBC: 7.9 10*3/uL (ref 4.0–10.5)

## 2010-08-19 NOTE — Miscellaneous (Signed)
Summary: Orders Update - LABS  Clinical Lists Changes  Orders: Added new Test order of T-Hgb A1C (in-house) [phone]) - Signed Added new Test order of T-CBC w/Diff ([id number]) - Signed Added new Test order of T-CD4SP [facility]) (CD4SP) - Signed Added new Test order of T-Comprehensive Metabolic Panel [phone]) - Signed Added new Test order of T-HIV Viral Load [phone]) - Signed Added new Test order of T-RPR (Syphilis) [phone]) - Signed Added new Test order of T-Lipid Profile ([id number]) - Signed     Process Orders Check Orders Results:     [facility]: ABN not required for this insurance Tests Sent for requisitioning (July 24, 2009 4:30 PM):     07/24/2009: [facility] -- T-CBC w/Diff [[id number]] (signed)     07/24/2009: [facility] -- T-Comprehensive Metabolic Panel [[id number]] (signed)     07/24/2009: [facility] -- T-HIV Viral Load [phone] (signed)     07/24/2009: [facility] -- T-RPR (Syphilis) [phone] (signed)     07/24/2009: [facility] -- T-Lipid Profile [phone] (signed)

## 2010-08-19 NOTE — Assessment & Plan Note (Signed)
Summary: 3WK F/U/VS   History of Present Illness: Jacob Joseph is in for his routine visit.  He says his low back pain is worse. it is worse on the left side that radiates to the right and down both anterior thighs.  The pain does not seem to be helped much by tramadol.  It is better when he is laying flat in bed and gets worse when he is up standing or walking.  He had a similar episode of pain about 8 years ago when living in [place].  He saw a chiropractor at that time and thinks he had some relief.  He still says that he is unable to afford another MRI scan.   Prior Medication List:  COMBIVIR 150-300 MG  TABS (LAMIVUDINE-ZIDOVUDINE) Take 1 tablet by mouth two times a day VIREAD 300 MG TABS (TENOFOVIR DISOPROXIL FUMARATE) Take 1 tablet by mouth once a day KALETRA 200-50 MG TABS (LOPINAVIR-RITONAVIR) Take 2 tablets by mouth twice a day LISINOPRIL-HYDROCHLOROTHIAZIDE 20-25 MG  TABS (LISINOPRIL-HYDROCHLOROTHIAZIDE) Take 1 tablet by mouth once a day TRAMADOL HCL 100 MG XR24H-TAB (TRAMADOL HCL) Take 1 tablet by mouth once a day AMBIEN 5 MG  TABS (ZOLPIDEM TARTRATE) Take 1-2 tablets by mouth at bedtime as needed VIAGRA 25 MG TABS (SILDENAFIL CITRATE) as directed prn LOMOTIL 2.5-0.025 MG TABS (DIPHENOXYLATE-ATROPINE) Take 1 tablet by mouth four times a day for diarrheal stools ALEVE 220 MG TABS (NAPROXEN SODIUM) Take 1 tablet by mouth once a day LAMISIL AT 1 % CREA (TERBINAFINE HCL) Apply to skin lesion two times a day   Current Allergies: No known allergies  Physical Exam  General:  alert and well-nourished.   Mouth:  pharynx pink and moist and fair dentition.  no erythema and no exudates.   Lungs:  normal breath sounds.  no crackles and no wheezes.   Heart:  normal rate, regular rhythm, and no murmur.   Neurologic:  alert & oriented X3.  he gets up out of the chair slowly but without assistance. his deep tendon reflexes are diminished in his left knee but otherwise normal.  Strength testing is  somewhat limited by pain but appears to be normal in both legs.  He has a negative straight leg raising test in both legs.   Impression & Recommendations:  Problem # 1:  LEG PAIN, LEFT (ICD-729.5) I suspect that he has muscular low back and leg pain.  I suggested that he try chiropractic treatment again and he is in agreement. Orders: Est. Patient Level III (30865)  Problem # 2:  HIV INFECTION (ICD-042) I will continue his current regimen. Diagnostics Reviewed:  CD4: 700 (07/25/2009)   WBC: 8.1 (07/24/2009)   Hgb: 11.7 (07/24/2009)   HCT: 35.1 (07/24/2009)   Platelets: 238 (07/24/2009) HIV genotype: REPORT (10/24/2007)   HIV-1 RNA: <48 copies/mL (07/24/2009)   HBSAg: NO (09/13/2006)  Orders: Est. Patient Level III (99213)Future Orders: T-CD4SP ([facility]) (CD4SP) ... 12/02/2009 T-HIV Viral Load [phone]) ... 12/02/2009       Medication Adherence: 09/03/2009   Adherence to medications reviewed with patient. Counseling to provide adequate adherence provided   Prevention For Positives: 09/03/2009   Safe sex practices discussed with patient. Condoms offered.                              Patient Instructions: 1)  Please schedule a follow-up appointment in 3 months.  Appended Document: Office Visit (Infectious Disease)  CC:  follow-up visit Depression.  Depression History:      The patient denies a depressed mood most of the day and a diminished interest in his usual daily activities.         Preventive Screening-Counseling & Management  Alcohol-Tobacco     Alcohol drinks/day: 0     Smoking Status: current     Smoking Cessation Counseling: yes     Smoke Cessation Stage: precontemplative     Packs/Day: 0.5     Cans of tobacco/week: no     Passive Smoke Exposure: yes  Caffeine-Diet-Exercise     Caffeine use/day: 1 cup of coffee per day     Does Patient Exercise: yes     Type of exercise: walking at work     Exercise (avg: min/session): >60      Times/week: 5  Hep-HIV-STD-Contraception     HIV Risk: no risk noted     HIV Risk Counseling: not indicated-no HIV risk noted  Safety-Violence-Falls     Seat Belt Use: yes  Comments: declined condoms      Sexual History:  currently monogamous.        Drug Use:  former.     Current Allergies (reviewed today): No known allergies  Social History: Drug Use:  former  Vital Signs:  Patient profile:   55 year old male Height:      72 inches (182.88 cm) Weight:      200.6 pounds (91.18 kg) BMI:     27.30 Temp:     97.8 degrees F (36.56 degrees C) oral Pulse rate:   100 / minute BP sitting:   127 / 77  (left arm) Cuff size:   large  Vitals Entered By: Jennet Maduro RN (September 03, 2009 11:49 AM) CC: follow-up visit Depression Is Patient Diabetic? No Pain Assessment Patient in pain? yes     Location: shoulder Intensity: 6 Type: sharp Onset of pain  Constant Nutritional Status BMI of 25 - 29 = overweight  Have you ever been in a relationship where you felt threatened, hurt or afraid?No   Does patient need assistance? Functional Status Self care Ambulation Impaired:Risk for fall Comments Pain causing pt. to almost drag his feet.

## 2010-08-19 NOTE — Progress Notes (Signed)
Summary: [facility]/pt assist meds arrived for Mar  Phone Note Refill Request      Prescriptions: LOMOTIL 2.5-0.025 MG TABS (DIPHENOXYLATE-ATROPINE) Take 1 tablet by mouth four times a day for diarrheal stools  #60 x 0   Entered by:   Paulo Fruit  BS,CPht II,MPH   Authorized by:   Cliffton Asters MD   Signed by:   Paulo Fruit  BS,CPht II,MPH on 09/17/2009   Method used:   Samples Given   RxID:   [id number] KALETRA 200-50 MG TABS (LOPINAVIR-RITONAVIR) Take 2 tablets by mouth twice a day  #120 x 0   Entered by:   Paulo Fruit  BS,CPht II,MPH   Authorized by:   Cliffton Asters MD   Signed by:   Paulo Fruit  BS,CPht II,MPH on 09/17/2009   Method used:   Samples Given   RxID:   [id number] VIREAD 300 MG TABS (TENOFOVIR DISOPROXIL FUMARATE) Take 1 tablet by mouth once a day  #30 x 0   Entered by:   Paulo Fruit  BS,CPht II,MPH   Authorized by:   Cliffton Asters MD   Signed by:   Paulo Fruit  BS,CPht II,MPH on 09/17/2009   Method used:   Samples Given   RxID:   [id number] COMBIVIR 150-300 MG  TABS (LAMIVUDINE-ZIDOVUDINE) Take 1 tablet by mouth two times a day  #60 x 0   Entered by:   Paulo Fruit  BS,CPht II,MPH   Authorized by:   Cliffton Asters MD   Signed by:   Paulo Fruit  BS,CPht II,MPH on 09/17/2009   Method used:   Samples Given   RxID:   [id number]  Patient Assist Medication Verification: Medication name: Diphenoxylate/Atropine 2.5mg .0.025mg  RX # [id number] Tech approval:MLD   Patient Assist Medication Verification: Medication:Kaletra 200/50mg  [id number]#[id number] Exp Date:30 Apr 2012 Tech approval:MLD                Patient Assist Medication Verification: Medication:Viread 300mg  DUK#[id number] Exp Date:07 2014 Tech approval:MLD                Patient Assist Medication Verification: Medication: Combivir 150/300mg  CBJ#[id number] Exp Date:Jul 2014 Tech approval:MLD               Call placed to patient with message that assistance medications are ready for  pick-up. Paulo Fruit  BS,CPht II,MPH  September 17, 2009 12:03 PM

## 2010-08-19 NOTE — Progress Notes (Signed)
Summary: request samples  Phone Note Outgoing Call   Caller: Patient Details for Reason: samples Summary of Call: Patient still has not been approved for Ncadap.  He is calling for samples for the month.  Please call pt back at [phone]. Initial call taken by: Paulo Fruit  BS,CPht II,MPH,  March 03, 2010 4:36 PM Call placed by: Paulo Fruit  BS,CPht II,MPH,  March 03, 2010 4:36 PM Call placed to: Patient Summary of Call: Patient may have samples. I will have prepared for tomorrow.  Patient said he will pick up for tomorrow.  He has a doctor's appt at 10 am 03/04/10 Initial call taken by: Paulo Fruit  BS,CPht II,MPH,  March 03, 2010 4:37 PM  Follow-up for Phone Call        Patient never came to his appt to receive his sample of medications. Follow-up by: Paulo Fruit  BS,CPht II,MPH,  March 05, 2010 9:14 AM

## 2010-08-19 NOTE — Progress Notes (Signed)
Summary: [facility]/pt assist meds arrived for Mar  Phone Note Refill Request      Prescriptions: LOMOTIL 2.5-0.025 MG TABS (DIPHENOXYLATE-ATROPINE) Take 1 tablet by mouth four times a day for diarrheal stools  #60 x 0   Entered by:   Paulo Fruit  BS,CPht II,MPH   Authorized by:   Cliffton Asters MD   Signed by:   Paulo Fruit  BS,CPht II,MPH on 10/11/2009   Method used:   Samples Given   RxID:   [id number] KALETRA 200-50 MG TABS (LOPINAVIR-RITONAVIR) Take 2 tablets by mouth twice a day  #120 x 0   Entered by:   Paulo Fruit  BS,CPht II,MPH   Authorized by:   Cliffton Asters MD   Signed by:   Paulo Fruit  BS,CPht II,MPH on 10/11/2009   Method used:   Samples Given   RxID:   [id number] VIREAD 300 MG TABS (TENOFOVIR DISOPROXIL FUMARATE) Take 1 tablet by mouth once a day  #30 x 0   Entered by:   Paulo Fruit  BS,CPht II,MPH   Authorized by:   Cliffton Asters MD   Signed by:   Paulo Fruit  BS,CPht II,MPH on 10/11/2009   Method used:   Samples Given   RxID:   [id number] COMBIVIR 150-300 MG  TABS (LAMIVUDINE-ZIDOVUDINE) Take 1 tablet by mouth two times a day  #60 x 0   Entered by:   Paulo Fruit  BS,CPht II,MPH   Authorized by:   Cliffton Asters MD   Signed by:   Paulo Fruit  BS,CPht II,MPH on 10/11/2009   Method used:   Samples Given   RxID:   [id number]  Patient Assist Medication Verification: Medication name: Diphenoxylate/Atropine 2.5mg /0.25mg  RX # [id number] Tech approval:MLD   Patient Assist Medication Verification: Medication: Kaletra 200/50mg  OVF#[id number] Exp Date:30 Apr 2012 Tech approval:MLD                Patient Assist Medication Verification: Medication:viread 300mg  JOA#[id number] Exp Date:07 2014 Tech approval:MLD                Patient Assist Medication Verification: Medication: Combivir 150mg /300mg  ZSW#[id number] Exp Date:Oct 2014 Tech approval:MLD **Patient already aware medictions are ready for pick up** Paulo Fruit  BS,CPht II,MPH  October 11, 2009 11:36 AM

## 2010-08-19 NOTE — Miscellaneous (Signed)
Summary: [facility] Financial Update   Clinical Lists Changes  Observations: Added new observation of PAYOR: No Insurance (06/16/2010 14:43) Added new observation of AIDSDAP: Yes 2011 (06/16/2010 14:43) Added new observation of PCTFPL: 122.93  (06/16/2010 14:43) Added new observation of INCOMESOURCE: Wages  (06/16/2010 14:43) Added new observation of HOUSEINCOME: 16109  (06/16/2010 14:43) Added new observation of HOUSING: Stable/permanent  (06/16/2010 14:43) Added new observation of #CHILD<18 IN: 2  (06/16/2010 14:43) Added new observation of FAMILYSIZE: 4  (06/16/2010 14:43) Added new observation of FINASSESSDT: 05/30/2010  (06/16/2010 14:43)

## 2010-08-19 NOTE — Assessment & Plan Note (Signed)
Summary: f/u [mkj]   CC:  follow-up visit, Depression, and has been out-of-work since the end of July.  History of Present Illness: Jacob Joseph is in for his routine visit today.  Unfortunately he has been off all medications for several months and has not been completed all of his application for medication assistance.  He met with our financial counselor this morning.  He lost his job recently.  He has been slightly more depressed but denies any thoughts of hurting himself.  He has relapsed and has been smoking marijuana.  Depression History:      The patient is having a depressed mood most of the day and has a diminished interest in his usual daily activities.        The patient denies that he feels like life is not worth living, denies that he wishes that he were dead, and denies that he has thought about ending his life.         Preventive Screening-Counseling & Management  Alcohol-Tobacco     Alcohol drinks/day: <1     Smoking Status: current     Smoking Cessation Counseling: yes     Smoke Cessation Stage: precontemplative     Packs/Day: 0.5     Cans of tobacco/week: no     Passive Smoke Exposure: yes  Caffeine-Diet-Exercise     Caffeine use/day: 1 cup of coffee per day     Does Patient Exercise: no  Hep-HIV-STD-Contraception     HIV Risk: no risk noted     HIV Risk Counseling: not indicated-no HIV risk noted  Safety-Violence-Falls     Seat Belt Use: yes  Comments: given condoms      Sexual History:  currently monogamous.        Drug Use:  current, marijuana, and occassionaly.     Updated Prior Medication List: he has been off all meds Current Allergies: No known allergies  Social History: Drug Use:  current, marijuana, occassionaly  Vital Signs:  Patient profile:   55 year old male Height:      72 inches (182.88 cm) Weight:      178.5 pounds (81.14 kg) BMI:     24.30  Vitals Entered By: Jennet Maduro RN (May 29, 2010 9:45 AM) CC: follow-up visit,  Depression, has been out-of-work since the end of July Is Patient Diabetic? No Pain Assessment Patient in pain? no      Nutritional Status BMI of 19 -24 = normal Nutritional Status Detail appetite "fair"  Have you ever been in a relationship where you felt threatened, hurt or afraid?No   Does patient need assistance? Functional Status Self care Ambulation Normal Comments has not had medicines for 2 months.    Physical Exam  General:  alert and well-nourished.   Mouth:  pharynx pink and moist and fair dentition.  no erythema and no exudates.   Lungs:  normal breath sounds.  no crackles and no wheezes.   Heart:  normal rate, regular rhythm, and no murmur.   Skin:  no rashes.   Axillary Nodes:  no R axillary adenopathy and no L axillary adenopathy.   Psych:  normally interactive, good eye contact, not anxious appearing, and not depressed appearing.     Impression & Recommendations:  Problem # 1:  HIV INFECTION (ICD-042) As expected, his infection is now out of control since he has been off all medications.  I will try to get him restarted on his regimen as soon as possible. Diagnostics Reviewed:  CD4:  370 (05/09/2010)   WBC: 8.1 (07/24/2009)   Hgb: 11.7 (07/24/2009)   HCT: 35.1 (07/24/2009)   Platelets: 238 (07/24/2009) HIV genotype: REPORT (10/24/2007)   HIV-1 RNA: 34600 (05/08/2010)   HBSAg: NO (09/13/2006)  Orders: Est. Patient Level IV (16109) Misc. Referral (Misc. Ref) Misc. Referral (Misc. Ref)  Problem # 2:  DEPRESSION (ICD-311) His depression is slightly worse recently due to the loss of his job and medications.  I will refer [facility] to [facility] for case managementand arrange counseling for mental health and substance abuse. Orders: Est. Patient Level IV (60454) Misc. Referral (Misc. Ref) Misc. Referral (Misc. Ref)  Problem # 3:  SUBSTANCE ABUSE (ICD-305.90) see above. Orders: Est. Patient Level IV (09811) Misc. Referral (Misc. Ref) Misc. Referral  (Misc. Ref)  Other Orders: Influenza Vaccine NON MCR (91478)  Patient Instructions: 1)  Please schedule a follow-up appointment in 1 month.    Immunizations Administered:  Influenza Vaccine # 1:    Vaccine Type: Fluvax Non-MCR    Site: left deltoid    Mfr: novartis    Dose: 0.5 ml    Route: IM    Given by: Jennet Maduro RN    Exp. Date: 10/19/2010    Lot #: [id number]  Flu Vaccine Consent Questions:    Do you have a history of severe allergic reactions to this vaccine? no    Any prior history of allergic reactions to egg and/or gelatin? no    Do you have a sensitivity to the preservative Thimersol? no    Do you have a past history of Guillan-Barre Syndrome? no    Do you currently have an acute febrile illness? no    Have you ever had a severe reaction to latex? no    Vaccine information given and explained to patient? yes

## 2010-08-19 NOTE — Progress Notes (Signed)
Summary: [facility]/pt assist meds arrived for Jan  Phone Note Refill Request      Prescriptions: LOMOTIL 2.5-0.025 MG TABS (DIPHENOXYLATE-ATROPINE) Take 1 tablet by mouth four times a day for diarrheal stools  #60 x 0   Entered by:   Paulo Fruit  BS,CPht II,MPH   Authorized by:   Cliffton Asters MD   Signed by:   Paulo Fruit  BS,CPht II,MPH on 08/14/2009   Method used:   Samples Given   RxID:   [id number] KALETRA 200-50 MG TABS (LOPINAVIR-RITONAVIR) Take 2 tablets by mouth twice a day  #120 x 0   Entered by:   Paulo Fruit  BS,CPht II,MPH   Authorized by:   Cliffton Asters MD   Signed by:   Paulo Fruit  BS,CPht II,MPH on 08/14/2009   Method used:   Samples Given   RxID:   [id number] VIREAD 300 MG TABS (TENOFOVIR DISOPROXIL FUMARATE) Take 1 tablet by mouth once a day  #30 x 0   Entered by:   Paulo Fruit  BS,CPht II,MPH   Authorized by:   Cliffton Asters MD   Signed by:   Paulo Fruit  BS,CPht II,MPH on 08/14/2009   Method used:   Samples Given   RxID:   [id number] COMBIVIR 150-300 MG  TABS (LAMIVUDINE-ZIDOVUDINE) Take 1 tablet by mouth two times a day  #60 x 0   Entered by:   Paulo Fruit  BS,CPht II,MPH   Authorized by:   Cliffton Asters MD   Signed by:   Paulo Fruit  BS,CPht II,MPH on 08/14/2009   Method used:   Samples Given   RxID:   [id number]  Patient Assist Medication Verification: Medication name: Diphenoxylate/Atropine 2.5mg /0.025mg  RX # [id number] Tech approval:MLD   Patient Assist Medication Verification: Medication:Combivir 150mg /300mg  Lot# [id number] Exp Date:Jul 2014 Tech approval:MLD                Patient Assist Medication Verification: Medication:Viread 300mg  [id number]#[id number] Exp Date:06 2014 Tech approval:MLD                Patient Assist Medication Verification: Medication:Kaletra 200/50mg  [id number] Exp Date:25 Mar 2012 Tech approval:MLD Call placed to patient with message that assistance medications are ready for pick-up. Paulo Fruit  BS,CPht II,MPH  August 14, 2009 4:51 PM

## 2010-08-19 NOTE — Miscellaneous (Signed)
  Clinical Lists Changes  Observations: Added new observation of YEARAIDSPOS: 1990  (06/11/2010 8:55) Added new observation of HIV STATUS: CDC-defined AIDS  (06/11/2010 8:55)

## 2010-08-19 NOTE — Assessment & Plan Note (Signed)
Summary: f/u appt /dde   CC:  follow-up visit, left leg pain getting worse, and Depression.  History of Present Illness: Jacob Joseph is in for his routine visit.  He says that he continues to have problems with his left leg pain and that the pain is becoming more debilitating.  He continues to describe pain and burning and most commonly noted in his anterior left thigh occasionally radiating down to his left calf.  The pain is better when he gets up in the morning and usually is barely noticeable but gets worse through the day he has he is standing at work.  On weekends when he is not working the pain gradually subsides.  He says that he has tried over-the-counter Tylenol, ibuprofen, and Aleve each on a regular basis without substantial relief.  He says that he has been doing his low back pain exercises each morning for about 15 minutes.  He says that initially it seemed to help but less so recently.  He is still paying off the pill from his initial MRI of his left leg and feels that he is unable to afford an MRI of his lumbosacral spine at this time.  He has not had any fever, chills, or sweats. By at the end of the visit today he was willing to tell me that he has experimented with some street drug to try to manage his pain.  He says he is not sure of all the names of the medicines.  He did try Xanax but it did not help much with the pain and made him too drowsy to work.  He says that he has taken some of his brother-in-law's Vicodin on rare occasions and found that that helped.  He says he also wants a medication called " football" that helped as well.  He says he is only done this very infrequently and only out over the last 3 to 6 months.  He has had no trouble getting his medications and has not had any changes in his medicines.  He appears to be taking all correctly and denies missing any doses.  Depression History:      The patient denies a depressed mood most of the day and a diminished interest in his  usual daily activities.        Flu Vaccine Consent Questions     Do you have a history of severe allergic reactions to this vaccine? no    Any prior history of allergic reactions to egg and/or gelatin? no    Do you have a sensitivity to the preservative Thimersol? no    Do you have a past history of Guillan-Barre Syndrome? no    Do you currently have an acute febrile illness? no    Have you ever had a severe reaction to latex? no    Vaccine information given and explained to patient? yes    Are you currently pregnant? no    Lot [id number]   Exp Date:10/17/2009   Manufacturer: [employer]    Site Given  Left Deltoid IM Jennet Maduro RN  August 13, 2009 4:28 PM   Preventive Screening-Counseling & Management  Alcohol-Tobacco     Alcohol drinks/day: 0     Smoking Status: current     Smoking Cessation Counseling: yes     Smoke Cessation Stage: precontemplative     Packs/Day: 0.5     Cans of tobacco/week: no     Passive Smoke Exposure: yes  Caffeine-Diet-Exercise     Caffeine use/day:  1 cup of coffee per day     Does Patient Exercise: yes     Type of exercise: walking at work     Exercise (avg: min/session): >60     Times/week: 5  Hep-HIV-STD-Contraception     HIV Risk: no risk noted  Safety-Violence-Falls     Seat Belt Use: yes  Comments: given condoms      Sexual History:  currently monogamous.        Drug Use:  current, intranasal cocaine, marijuana, and for leg pain.     Prior Medication List:  COMBIVIR 150-300 MG  TABS (LAMIVUDINE-ZIDOVUDINE) Take 1 tablet by mouth two times a day VIREAD 300 MG TABS (TENOFOVIR DISOPROXIL FUMARATE) Take 1 tablet by mouth once a day KALETRA 200-50 MG TABS (LOPINAVIR-RITONAVIR) Take 2 tablets by mouth twice a day LISINOPRIL-HYDROCHLOROTHIAZIDE 20-25 MG  TABS (LISINOPRIL-HYDROCHLOROTHIAZIDE) Take 1 tablet by mouth once a day AMBIEN 5 MG  TABS (ZOLPIDEM TARTRATE) Take 1-2 tablets by mouth at bedtime as needed VIAGRA 25 MG TABS  (SILDENAFIL CITRATE) as directed prn LOMOTIL 2.5-0.025 MG TABS (DIPHENOXYLATE-ATROPINE) Take 1 tablet by mouth four times a day for diarrheal stools ALEVE 220 MG TABS (NAPROXEN SODIUM) Take 1 tablet by mouth once a day LAMISIL AT 1 % CREA (TERBINAFINE HCL) Apply to skin lesion two times a day   Current Allergies (reviewed today): No known allergies  Social History: Sexual History:  currently monogamous Drug Use:  current, intranasal cocaine, marijuana, for leg pain  Vital Signs:  Patient profile:   55 year old male Height:      72 inches (182.88 cm) Weight:      203.9 pounds (92.68 kg) BMI:     27.75 Temp:     97.8 degrees F (36.56 degrees C) oral Pulse rate:   85 / minute BP sitting:   113 / 72  (left arm) Cuff size:   large  Vitals Entered By: Jennet Maduro RN (August 13, 2009 3:48 PM) CC: follow-up visit, left leg pain getting worse, Depression Is Patient Diabetic? Yes Pain Assessment Patient in pain? yes     Location: left leg Intensity: 10 Type: burning, aches Onset of pain  Constant Nutritional Status BMI of 25 - 29 = overweight Nutritional Status Detail appetite "good"  Have you ever been in a relationship where you felt threatened, hurt or afraid?No   Does patient need assistance? Functional Status Self care Ambulation Normal Comments no missed doses of rxes   Physical Exam  General:  alert and well-nourished.   Mouth:  pharynx pink and moist and fair dentition.  no erythema and no exudates.   Lungs:  normal breath sounds.  no crackles and no wheezes.   Heart:  normal rate, regular rhythm, and no murmur.   Abdomen:  soft, non-tender, and normal bowel sounds.   Neurologic:  alert & oriented X3, strength normal in all extremities, sensation intact to light touch, and gait normal.   Psych:  normally interactive, good eye contact, not anxious appearing, and not depressed appearing.          Medication Adherence: 08/13/2009   Adherence to medications  reviewed with patient. Counseling to provide adequate adherence provided                                Impression & Recommendations:  Problem # 1:  HIV INFECTION (ICD-042) His HIV infection remains under excellent control.  I will not  make any changes today. Diagnostics Reviewed:  CD4: 700 (07/25/2009)   WBC: 8.1 (07/24/2009)   Hgb: 11.7 (07/24/2009)   HCT: 35.1 (07/24/2009)   Platelets: 238 (07/24/2009) HIV genotype: REPORT (10/24/2007)   HIV-1 RNA: <48 copies/mL (07/24/2009)   HBSAg: NO (09/13/2006)  Orders: Est. Patient Level IV (44010)  Problem # 2:  LEG PAIN, LEFT (ICD-729.5) Hershy  has chronic progressive left leg pain which I suspect may be radicular pain from degenerative disk disease. Unfortunately, he is unable to undergo the MRI due to the financial strain it will place on him.  I had a long discussion with him today about his history of addiction, use of street drugs, and the risk of trying to manage his pain long term with prescription narcotics.  I have agreed to prescribe tramadol under contract with the long range plan to do further diagnostic evaluation as soon as possible. Orders: Est. Patient Level IV (27253)  Problem # 3:  ESSENTIAL HYPERTENSION (ICD-401.9) His blood pressure is under good control. His updated medication list for this problem includes:    Lisinopril-hydrochlorothiazide 20-25 Mg Tabs (Lisinopril-hydrochlorothiazide) .[name] Kitchen... Take 1 tablet by mouth once a day  Orders: Est. Patient Level IV (66440)  Medications Added to Medication List This Visit: 1)  Tramadol Hcl 100 Mg Xr24h-tab (Tramadol hcl) .... Take 1 tablet by mouth once a day  Other Orders: Admin 1st Vaccine (34742) Flu Vaccine 43yrs + (59563)  Patient Instructions: 1)  Please schedule a follow-up appointment in 3 weeks.  Prescriptions: TRAMADOL HCL 100 MG XR24H-TAB (TRAMADOL HCL) Take 1 tablet by mouth once a day  #30 x 1   Entered and Authorized by:   Cliffton Asters MD    Signed by:   Cliffton Asters MD on 08/13/2009   Method used:   Print then Give to Patient   RxID:   [id number]

## 2010-08-19 NOTE — Miscellaneous (Signed)
Summary: Medication Contract  Medication Contract   Imported By: Florinda Marker 08/14/2009 15:27:39  _____________________________________________________________________  External Attachment:    Type:   Image     Comment:   External Document

## 2010-08-19 NOTE — Progress Notes (Signed)
Summary: Needs assistance with meds.   Phone Note Call from Patient   Caller: Spouse Summary of Call:  Voice mail: Jacob Joseph is calling c/o Kerin was seen at Urgent Care this week end and was given scripts. They can not pay for the medications and need assistance.    Tomasita Morrow RN  June 23, 2010 12:35 PM   Follow-up for Phone Call        left message on voicemail:  we are not able to provide money for medications , they should call their case manager at  [facility] to see if they can help.  Tomasita Morrow RN  June 23, 2010 12:36 PM  Follow-up by: Tomasita Morrow RN,  June 23, 2010 12:36 PM

## 2010-08-19 NOTE — Assessment & Plan Note (Signed)
Summary: resfrom 5/31kam   CC:  f/u.  History of Present Illness: Jacob Joseph is in for his routine visit.  He was late turning and his financial information and was cut off of ADAP in April.  He has been off of his HIV medicines since that time but was told that he could pick up samples from [facility] today.  He has been getting his blood pressure medication from [facility] and does not miss.  He still says that he has no desire to quit smoking cigarettes.  His back pain and left leg pain resolved shortly after his last visit.  He did not need to see the chiropractor.  Preventive Screening-Counseling & Management  Alcohol-Tobacco     Alcohol drinks/day: 0     Smoking Status: current     Smoking Cessation Counseling: yes     Smoke Cessation Stage: precontemplative     Packs/Day: 0.5     Cans of tobacco/week: no     Passive Smoke Exposure: yes  Caffeine-Diet-Exercise     Caffeine use/day: 1 cup of coffee per day     Does Patient Exercise: yes     Type of exercise: walking at work     Exercise (avg: min/session): >60     Times/week: 5  Hep-HIV-STD-Contraception     HIV Risk: no risk noted     HIV Risk Counseling: not indicated-no HIV risk noted  Safety-Violence-Falls     Seat Belt Use: yes  Comments: given condoms      Sexual History:  currently monogamous.        Drug Use:  former.     Updated Prior Medication List: has been out of HIV meds since April Current Allergies (reviewed today): No known allergies  Vital Signs:  Patient profile:   55 year old male Height:      72 inches (182.88 cm) Weight:      172.75 pounds (78.52 kg) BMI:     23.51 Temp:     98.3 degrees F (36.83 degrees C) oral Pulse rate:   89 / minute BP sitting:   127 / 84  (left arm) Cuff size:   regular  Vitals Entered By: Jennet Maduro RN (December 31, 2009 10:16 AM) CC: f/u Is Patient Diabetic? No Pain Assessment Patient in pain? no      Nutritional Status BMI of 19 -24 = normal Nutritional Status  Detail appetite "good"  Have you ever been in a relationship where you felt threatened, hurt or afraid?No   Does patient need assistance? Functional Status Self care Ambulation Normal Comments missed quite a few doses of medications.  Its been a "couple" of months since he has taken medications.   Dropped from [facility]AP due to insufficient paperwork from his wife.  Some question about jplacing the pt. on samples but poor follow through.  Pt. needs help with medications.    Physical Exam  General:  alert and well-nourished.   Mouth:  pharynx pink and moist and fair dentition.  no erythema and no exudates.   Lungs:  normal breath sounds.  no crackles and no wheezes.   Heart:  normal rate, regular rhythm, and no murmur.   Skin:  no rashes.   Psych:  normally interactive, good eye contact, not anxious appearing, and not depressed appearing.          Medication Adherence: 12/31/2009   Adherence to medications reviewed with patient. Counseling to provide adequate adherence provided   Prevention For Positives: 12/31/2009   Safe  sex practices discussed with patient. Condoms offered.                             Impression & Recommendations:  Problem # 1:  HIV INFECTION (ICD-042) I will check a CD4 count and viral load today as a new baseline and then have him repeat it in 6 weeks after, hopefully, being restarted on medications. Diagnostics Reviewed:  CD4: 700 (07/25/2009)   WBC: 8.1 (07/24/2009)   Hgb: 11.7 (07/24/2009)   HCT: 35.1 (07/24/2009)   Platelets: 238 (07/24/2009) HIV genotype: REPORT (10/24/2007)   HIV-1 RNA: <48 copies/mL (07/24/2009)   HBSAg: NO (09/13/2006)  Orders: T-CD4SP ([facility]) (CD4SP) T-HIV Viral Load ([id number]) Est. Patient Level IV (99214)Future Orders: T-CD4SP ([facility]) (CD4SP) ... 02/11/2010 T-HIV Viral Load [id number]) ... 02/11/2010  Problem # 2:  LEG PAIN, LEFT (ICD-729.5) Fortunately, he had spontaneous resolution of his musculoskeletal back and  leg pain. Orders: Est. Patient Level IV (29562)  Problem # 3:  ESSENTIAL HYPERTENSION (ICD-401.9) I will continue his current regimen. His updated medication list for this problem includes:    Lisinopril-hydrochlorothiazide 20-25 Mg Tabs (Lisinopril-hydrochlorothiazide) .[name] Kitchen... Take 1 tablet by mouth once a day  Orders: Est. Patient Level IV (13086)  Problem # 4:  TOBACCO USER (ICD-305.1) I have encouraged him to consider quitting cigarettes again. Orders: Est. Patient Level IV (57846)  Patient Instructions: 1)  Please schedule a follow-up appointment in 2 months.     Appended Document: resfrom 5/31kam Sample Given, Lot #: Kaletra 200/50mg  [id number] Expiration Date:10/2011 Patient has been instructed regarding the correct time, dose and frequency of taking this med, including desired effects and most common side effects.  Sample Given, Lot #: Combivir 150/300mg  [id number] Expiration Date:01/2013 Patient has been instructed regarding the correct time, dose and frequency of taking this med, including desired effects and most common side effects.  Sample Given, Lot #:Viread 300mg  [id number]  Expiration Date:08 2013 Patient has been instructed regarding the correct time, dose and frequency of taking this med, including desired effects and most common side effects.

## 2010-08-19 NOTE — Miscellaneous (Signed)
  Clinical Lists Changes  Observations: Added new observation of YEARAIDSPOS: 1983  (06/11/2010 8:55)

## 2010-08-21 NOTE — Assessment & Plan Note (Signed)
Summary: F/U[MKJ]   CC:  follow-up visit and UC visit for "pinched nerve" in lower back.  History of Present Illness: Jacob Joseph is in for his routine visit.  He was able to restart his HIV medications 3 weeks ago.  His diarrhea has recurred since restarting Kaletra and he has been unable to afford Lomotil.  His greatest problem currently is his persistent lower back and left leg pain.  [facility] and OxyContin but cannot afford them.  He is currently out of work and unable to work because of the pain.  He continues to smoke cigarettes but wants to quit.  Preventive Screening-Counseling & Management  Alcohol-Tobacco     Alcohol drinks/day: <1     Smoking Status: current     Smoking Cessation Counseling: yes     Smoke Cessation Stage: precontemplative     Packs/Day: 0.5     Cans of tobacco/week: no     Passive Smoke Exposure: yes  Caffeine-Diet-Exercise     Caffeine use/day: 1 cup of coffee per day     Does Patient Exercise: no     Type of exercise: walking at work     Exercise (avg: min/session): >60     Times/week: 5  Hep-HIV-STD-Contraception     HIV Risk: no risk noted     HIV Risk Counseling: not indicated-no HIV risk noted  Safety-Violence-Falls     Seat Belt Use: yes  Comments: given condoms      Sexual History:  currently monogamous.        Drug Use:  current, crack cocaine, and 3-4 times a week.     Current Allergies (reviewed today): No known allergies  Social History: Drug Use:  current, crack cocaine, 3-4 times a week  Vital Signs:  Patient profile:   55 year old male Height:      72 inches (182.88 cm) Weight:      182 pounds (82.73 kg) BMI:     24.77 Temp:     98.1 degrees F (36.72 degrees C) oral Pulse rate:   89 / minute BP sitting:   133 / 88  (left arm) Cuff size:   large  Vitals Entered By: Jennet Maduro RN (July 03, 2010 10:48 AM) CC: follow-up visit, UC visit  for "pinched nerve" in lower back Is Patient Diabetic? No Pain Assessment Patient in pain? yes     Location: lower back Intensity: 10 Type: sharp Onset of pain  moves down his left leg, gets worse with walking Nutritional Status BMI of 19 -24 = normal Nutritional Status Detail appetite "good'  Have you ever been in a relationship where you felt threatened, hurt or afraid?not asked, wife present   Does patient need assistance? Functional Status Self care Ambulation Normal Comments is taking meds x 3 weeks, has missed one dose   Physical Exam  General:  alert and well-nourished.   Mouth:  pharynx pink and moist and fair dentition.  no erythema and no exudates.   Lungs:  normal breath sounds.  no crackles and no wheezes.   Heart:  normal rate, regular rhythm, and no murmur.   Neurologic:  strength normal in all extremities, sensation intact to pinprick, gait normal, and DTRs symmetrical and normal.          Medication Adherence: 07/03/2010   Adherence to medications reviewed with patient. Counseling to provide adequate adherence provided  Impression & Recommendations:  Problem # 1:  HIV INFECTION (ICD-042) He does not want to change Kaletra to an alternative protease inhibitor.  I will continue his current regimen and recheck his blood work in 6 weeks. Diagnostics Reviewed:  HIV: CDC-defined AIDS (06/11/2010)   CD4: 370 (05/09/2010)   WBC: 8.1 (07/24/2009)   Hgb: 11.7 (07/24/2009)   HCT: 35.1 (07/24/2009)   Platelets: 238 (07/24/2009) HIV genotype: REPORT (10/24/2007)   HIV-1 RNA: 34600 (05/08/2010)   HBSAg: NO (09/13/2006)  Orders: Est. Patient Level IV (99214)Future Orders: T-CD4SP ([facility]) (CD4SP) ... 08/14/2010 T-HIV Viral Load [id number]) ... 08/14/2010 T-Comprehensive Metabolic Panel [id number]) ... 08/14/2010 T-CBC w/Diff ([id number]) ... 08/14/2010 T-RPR (Syphilis) [id number]) ... 08/14/2010 T-Lipid Profile  [id number]) ... 08/14/2010  Problem # 2:  LEG PAIN, LEFT (ICD-729.5) I suspect that he has radicular pain due to a herniated disk.  His lack of medical insurance makes referral difficult but will see if he can be seen in the radiology department. Orders: Est. Patient Level IV (43329) Radiology Referral (Radiology)  Problem # 3:  ESSENTIAL HYPERTENSION (ICD-401.9) He does not limit salt in his diet and is reluctant to start any other medications due to cost.  I will continue his current regimen for now. His updated medication list for this problem includes:    Lisinopril-hydrochlorothiazide 20-25 Mg Tabs (Lisinopril-hydrochlorothiazide) .[name] Kitchen... Take 1 tablet by mouth once a day  Orders: Est. Patient Level IV (99214)  BP today: 133/88 Prior BP: 127/84 (12/31/2009)  Labs Reviewed: K+: 3.4 (07/24/2009) Creat: : 0.99 (07/24/2009)   Chol: 132 (07/24/2009)   HDL: 38 (07/24/2009)   LDL: 62 (07/24/2009)   TG: 158 (07/24/2009)  Problem # 4:  TOBACCO USER (ICD-305.1) I have talked to him about a cigarette cessation plan have given him written information about local resources to help him quit. Orders: Est. Patient Level IV (51884)  Patient Instructions: 1)  Please schedule a follow-up appointment in 2 months.  Prescriptions: LISINOPRIL-HYDROCHLOROTHIAZIDE 20-25 MG  TABS (LISINOPRIL-HYDROCHLOROTHIAZIDE) Take 1 tablet by mouth once a day  #30 Each x 11   Entered and Authorized by:   Cliffton Asters MD   Signed by:   Cliffton Asters MD on 07/03/2010   Method used:   Electronically to        [facility] [phone]* (retail)       [address]       [place], [place]  [address]       Ph: [phone]       Fax: [phone]   RxID:   [id number]

## 2010-09-04 ENCOUNTER — Encounter: Payer: Self-pay | Admitting: Internal Medicine

## 2010-09-04 ENCOUNTER — Ambulatory Visit (INDEPENDENT_AMBULATORY_CARE_PROVIDER_SITE_OTHER): Payer: Self-pay | Admitting: Internal Medicine

## 2010-09-04 DIAGNOSIS — F1910 Other psychoactive substance abuse, uncomplicated: Secondary | ICD-10-CM

## 2010-09-04 DIAGNOSIS — F329 Major depressive disorder, single episode, unspecified: Secondary | ICD-10-CM

## 2010-09-04 DIAGNOSIS — F3289 Other specified depressive episodes: Secondary | ICD-10-CM

## 2010-09-04 DIAGNOSIS — I10 Essential (primary) hypertension: Secondary | ICD-10-CM

## 2010-09-04 DIAGNOSIS — B20 Human immunodeficiency virus [HIV] disease: Secondary | ICD-10-CM

## 2010-09-10 NOTE — Assessment & Plan Note (Signed)
Summary: 42month f/u (mkj)   Vital Signs:  Patient profile:   55 year old male Height:      72 inches (182.88 cm) Weight:      186.8 pounds (84.91 kg) BMI:     25.43 Temp:     98.8 degrees F (37.11 degrees C) oral Pulse rate:   87 / minute BP sitting:   156 / 95  (left arm)  Vitals Entered By: Wendall Mola CMA Duncan Dull) (September 04, 2010 3:43 PM) CC: follow-up visit, lab results, c/o diarrhea after meds Is Patient Diabetic? No Pain Assessment Patient in pain? yes     Location: lower back Intensity: 7 Type: aching Onset of pain  Constant Nutritional Status BMI of 25 - 29 = overweight Nutritional Status Detail appetite "not good"  Have you ever been in a relationship where you felt threatened, hurt or afraid?No   Does patient need assistance? Functional Status Self care Ambulation Normal Comments pt. missed a few doses of meds since last visit   CC:  follow-up visit, lab results, and c/o diarrhea after meds.  History of Present Illness: Jacob Joseph is in for his routine visit.  He states that he has been missing more doses of his medications since his last visit.  He estimates he may have been missing up to 5 doses each week.  When asked what causes him to mid-us he says that there a variety of problems including sleeping late on occasions, forgetting to take them, and not wanting to take them if he does not have much of an appetite.  He admits that his mood is more up and down recently.  He states that he has been using crack cocaine several times a week for the past year after a prolonged period of sobriety.  Preventive Screening-Counseling & Management  Alcohol-Tobacco     Alcohol drinks/day: <1     Smoking Status: current     Smoking Cessation Counseling: yes     Smoke Cessation Stage: precontemplative     Packs/Day: 0.5     Cans of tobacco/week: no     Passive Smoke Exposure: yes  Caffeine-Diet-Exercise     Caffeine use/day: 1 cup of coffee per day     Does  Patient Exercise: no     Type of exercise: walking at work     Exercise (avg: min/session): >60     Times/week: 5  Hep-HIV-STD-Contraception     HIV Risk: no risk noted     HIV Risk Counseling: not indicated-no HIV risk noted  Safety-Violence-Falls     Seat Belt Use: yes      Sexual History:  currently monogamous.        Drug Use:  current, crack cocaine, and 3-4 times a week.    Comments: pt. given condoms  Current Medications (verified): 1)  Combivir 150-300 Mg  Tabs (Lamivudine-Zidovudine) .... Take 1 Tablet By Mouth Two Times A Day 2)  Viread 300 Mg Tabs (Tenofovir Disoproxil Fumarate) .... Take 1 Tablet By Mouth Once A Day 3)  Kaletra 200-50 Mg Tabs (Lopinavir-Ritonavir) .... Take 2 Tablets By Mouth Twice A Day 4)  Lisinopril-Hydrochlorothiazide 20-25 Mg  Tabs (Lisinopril-Hydrochlorothiazide) .... Take 1 Tablet By Mouth Once A Day 5)  Viagra 25 Mg Tabs (Sildenafil Citrate) .... As Directed Prn  Allergies (verified): No Known Drug Allergies  Physical Exam  General:  alert and well-nourished.   Mouth:  pharynx pink and moist and fair dentition.  no erythema and no exudates.  Lungs:  normal breath sounds.  no crackles and no wheezes.   Heart:  normal rate, regular rhythm, and no murmur.   Skin:  no rashes.   Psych:  good eye contact, not anxious appearing, flat affect, and subdued.          Medication Adherence: 09/04/2010   Adherence to medications reviewed with patient. Counseling to provide adequate adherence provided   Prevention For Positives: 09/04/2010   Safe sex practices discussed with patient. Condoms offered.                             Impression & Recommendations:  Problem # 1:  HIV INFECTION (ICD-042) His infection is under better control since he restarted his medicines in late November but I am very concerned about his difficulty with endurance.  I think this is largely driven by his reviewed substance abuse and depression.  He has started  talking to his case manager at [facility] but hopes to reenter counseling.  I have asked him to set his wife cell phone alarm to remind him to take his medications. Diagnostics Reviewed:  HIV: CDC-defined AIDS (06/11/2010)   CD4: 440 (08/15/2010)   WBC: 6.3 (08/14/2010)   Hgb: 11.1 (08/14/2010)   HCT: 32.5 (08/14/2010)   Platelets: 244 (08/14/2010) HIV genotype: REPORT (10/24/2007)   HIV-1 RNA: 322 (08/14/2010)   HBSAg: NO (09/13/2006)  Orders: Est. Patient Level IV (99214)Future Orders: T-CD4SP ([facility]) (CD4SP) ... 10/16/2010 T-HIV Viral Load [phone]) ... 10/16/2010  Problem # 2:  ESSENTIAL HYPERTENSION (ICD-401.9) His blood pressure is poorly controlled as a result of poor adherence with his antihypertensive medication and steady use of cocaine. His updated medication list for this problem includes:    Lisinopril-hydrochlorothiazide 20-25 Mg Tabs (Lisinopril-hydrochlorothiazide) .[name] Kitchen... Take 1 tablet by mouth once a day  Orders: Est. Patient Level IV (99214)  BP today: 156/95 Prior BP: 133/88 (07/03/2010)  Labs Reviewed: K+: 3.8 (08/14/2010) Creat: : 1.41 (08/14/2010)   Chol: 114 (08/14/2010)   HDL: 38 (08/14/2010)   LDL: 44 (08/14/2010)   TG: 158 (08/14/2010)  Problem # 3:  SUBSTANCE ABUSE (ICD-305.90) I've asked him to consider reentering counseling and NA meetings. Orders: Est. Patient Level IV (25366)  Problem # 4:  DEPRESSION (ICD-311) See  above. Orders: Est. Patient Level IV (44034)  Medications Added to Medication List This Visit: 1)  Ensure Liqd (Nutritional supplements) .[name] Kitchen.. 1 can daily  Patient Instructions: 1)  Please schedule a follow-up appointment in 2 months.  Prescriptions: ENSURE  LIQD (NUTRITIONAL SUPPLEMENTS) 1 can daily  #30 x 5   Entered and Authorized by:   Cliffton Asters MD   Signed by:   Cliffton Asters MD on 09/04/2010   Method used:   Print then Give to Patient   RxID:   [id number]    Orders Added: 1)  T-CD4SP ([facility]) [CD4SP] 2)   T-HIV Viral Load [[id number]] 3)  Est. Patient Level IV [63016]

## 2010-10-01 ENCOUNTER — Encounter: Payer: Self-pay | Admitting: Licensed Clinical Social Worker

## 2010-10-01 LAB — T-HELPER CELL (CD4) - (RCID CLINIC ONLY): CD4 % Helper T Cell: 19 % — ABNORMAL LOW (ref 33–55)

## 2010-10-06 LAB — T-HELPER CELL (CD4) - (RCID CLINIC ONLY): CD4 % Helper T Cell: 21 % — ABNORMAL LOW (ref 33–55)

## 2010-10-08 LAB — GLUCOSE, CAPILLARY: Glucose-Capillary: 121 mg/dL — ABNORMAL HIGH (ref 70–99)

## 2010-10-08 LAB — POCT I-STAT, CHEM 8
Chloride: 105 mEq/L (ref 96–112)
HCT: 42 % (ref 39.0–52.0)
Hemoglobin: 14.3 g/dL (ref 13.0–17.0)
Potassium: 3.6 mEq/L (ref 3.5–5.1)
Sodium: 138 mEq/L (ref 135–145)

## 2010-10-15 ENCOUNTER — Other Ambulatory Visit: Payer: Self-pay

## 2010-10-27 ENCOUNTER — Other Ambulatory Visit (INDEPENDENT_AMBULATORY_CARE_PROVIDER_SITE_OTHER): Payer: Self-pay

## 2010-10-27 DIAGNOSIS — B20 Human immunodeficiency virus [HIV] disease: Secondary | ICD-10-CM

## 2010-10-28 LAB — HIV-1 RNA QUANT-NO REFLEX-BLD
HIV 1 RNA Quant: 26 copies/mL — ABNORMAL HIGH (ref <20)
HIV-1 RNA Quant, Log: 1.41 {Log} — ABNORMAL HIGH (ref <1.30)

## 2010-10-28 LAB — T-HELPER CELL (CD4) - (RCID CLINIC ONLY)
CD4 % Helper T Cell: 18 % — ABNORMAL LOW (ref 33–55)
CD4 T Cell Abs: 580 uL (ref 400–2700)

## 2010-10-29 LAB — T-HELPER CELL (CD4) - (RCID CLINIC ONLY): CD4 T Cell Abs: 500 uL (ref 400–2700)

## 2010-11-03 ENCOUNTER — Other Ambulatory Visit: Payer: Self-pay | Admitting: *Deleted

## 2010-11-03 ENCOUNTER — Ambulatory Visit (INDEPENDENT_AMBULATORY_CARE_PROVIDER_SITE_OTHER): Payer: Self-pay | Admitting: Internal Medicine

## 2010-11-03 ENCOUNTER — Encounter: Payer: Self-pay | Admitting: Internal Medicine

## 2010-11-03 VITALS — BP 89/56 | HR 89 | Temp 98.6°F | Ht 72.0 in | Wt 185.5 lb

## 2010-11-03 DIAGNOSIS — F1910 Other psychoactive substance abuse, uncomplicated: Secondary | ICD-10-CM

## 2010-11-03 DIAGNOSIS — I10 Essential (primary) hypertension: Secondary | ICD-10-CM

## 2010-11-03 DIAGNOSIS — B20 Human immunodeficiency virus [HIV] disease: Secondary | ICD-10-CM

## 2010-11-03 MED ORDER — LISINOPRIL-HYDROCHLOROTHIAZIDE 20-25 MG PO TABS: 1.0000 | ORAL_TABLET | Freq: Every day | ORAL | Status: DC

## 2010-11-03 NOTE — Progress Notes (Signed)
  Subjective:    Patient ID: Jacob Joseph, male    DOB: Dec 22, 1955, 55 y.o.   MRN: [id number]  HPI Ehsan is in for his routine visit. He continues to use crack cocaine 1-2 times each week. He has not sought any help for his relapsed addiction. He states he's had some difficulty paying for his blood pressure medication and that even with a co-pay car he has to pay $20 each month. He has not had any problem getting his HIV medications and denies missing a single dose.    Review of Systems     Objective:   Physical Exam  Constitutional: He appears well-developed and well-nourished. No distress.  HENT:  Mouth/Throat: Oropharynx is clear and moist. No oropharyngeal exudate.  Cardiovascular: Normal rate and regular rhythm.   No murmur heard. Pulmonary/Chest: Breath sounds normal. He has no wheezes.  Psychiatric: He has a normal mood and affect.          Assessment & Plan:

## 2010-11-03 NOTE — Assessment & Plan Note (Signed)
I encouraged him and his wife to return to NA meetings and try to regain his sobriety.

## 2010-11-03 NOTE — Assessment & Plan Note (Signed)
His infection has improved dramatically since he restarted his medications 6 months ago. I will continue his current regimen.

## 2010-11-03 NOTE — Assessment & Plan Note (Signed)
He was instructed to get a three-month supply of his blood pressure medication at [facility] for $10.

## 2010-12-05 NOTE — Discharge Summary (Signed)
Jacob Joseph, Jacob Joseph                          ACCOUNT NO.:  [id number]   MEDICAL RECORD NO.:  [id number]                   PATIENT TYPE:  INP   LOCATION:  5507                                 FACILITY:  [facility]   PHYSICIAN:  Alvester Morin, M.D.               DATE OF BIRTH:  1955/11/17   DATE OF ADMISSION:  09/04/2003  DATE OF DISCHARGE:  09/08/2003                                 DISCHARGE SUMMARY   CONTINUITY DOCTOR:  Cliffton Asters, M.D.   DISCHARGE DIAGNOSES:  1. Left lower lobe pneumococcal pneumonia.  2. Pneumococcal bacteremia.  3. Human immunodeficiency virus infection diagnosed in 1984.  4. Hepatitis C.  5. Thrush.  6. Tuberculosis diagnosed in 1988.     A. Six months of treatment for latent tuberculosis in 1988.     B. Twelve months of treatment for active tuberculosis in 1989.  7. History of polysubstance abuse.  8. Tobacco abuse.  9. Hypertension.  10.      Depression.   DISCHARGE MEDICATIONS:  1. Hydrochlorothiazide 25 mg p.o. q.d.  2. Amoxicillin 500 mg p.o. b.i.d.  3. Nystatin 5 ml p.o. q.i.d.  4. Tylenol 650 mg q.6h. p.r.n. pain.  5. Ibuprofen 400 mg q.4-6h. p.r.n. pain.   PROCEDURES PERFORMED:  1. On September 04, 2003:  CT of the chest with contrast medium.  Impression:     Extensive consolidative infiltrative changes within the left lower lobe.     Also, the left lower lobe bronchus appears either filled with debris or     narrowed.  I recommend follow-up studies over time to be certain that     this area clears completely, as an endobronchial lesion cannot be     excluded at this time.  No mediastinal or hilar adenopathy.  No CT scan     evidence for PE.  2. CT scan of the lower extremities:  On September 04, 2003.  Impression:     Negative study for lower extremity DVT.  3. Portable chest x-ray.  Impression:  Mild cardiac enlargement.     Eventration of left hemidiaphragm with some streaky left basilar density.     There is also minimal streaky right  basilar atelectasis.  Recommend     followup of two-view chest when able to better evaluate left lower lobe.  4. EKG on September 04, 2003:  Sinus tachycardia 127 beats per minute.   HISTORY OF PRESENT ILLNESS:  Jacob Joseph is a 55 year old African-American  male with a past medical history remarkable for HIV infection with an  October, 2004 CD4 of 390 and viral load of 37,700 and January, 2005 CD4  count of 203 and viral load of 65,200.  Hypertension.  Hepatitis C.  Latent  and active TB in 1988 and 1989, respectively.  He was treated for TB on both  occasions.  He presents to the ED with chest pain and shortness of breath  for 24 hours prior to admission with hemoptysis, which is getting  progressively worse due to increasing cough frequency.  He states that he  began feeling ill one week prior to admission with sore throat and body  aches.  He then developed a cough, chest pain, and shortness of breath that  was accompanied by fevers and chills one day prior to admission.  His chest  pain is left-sided, sharp, and worse with breathing and movement.  He denies  nausea or vomiting.  He denies sick contacts or recent travel history.   PAST MEDICAL HISTORY:  1. HIV diagnosed in 1984.  2. Hepatitis C.  3. Tuberculosis diagnosed in 1988.  4. Drug addiction, primarily heroin, but also alcohol, cocaine, and     marijuana.  Patient's last use of alcohol was October, 55.  Patient's     last use of heroin was one year ago.  5. Depression.  6. Hypertension.  7. Tobacco abuse.  8. Degenerative disk disease.   SUBSTANCE HISTORY:  Mr. [name] currently smokes one pack per day and has a  30-pack-year history.  He denies alcohol, cocaine, or IV drug use.  He does  report marijuana use in the last couple of days prior to admission.   FAMILY HISTORY:  Mother:  Living at 83.  No medical problems.  Father:  Deceased in his 21s, cancer.  Siblings:  Four, health.  Children:  One 16-  year-old son,  healthy.   REVIEW OF SYSTEMS:  GENERAL:  Fever and chills one day prior to admission.  Weight loss of a few pounds in the last couple of weeks.  HEENT:  Sore  throat x1 week.  CV:  Chest pain, palpitations, dyspnea, paroxysmal  nocturnal edema x1 night.  RESPIRATORY:  Dyspnea.  Dyspnea on exertion.  Cough.  Sputum.  Hemoptysis.  Wheezing.  GI:  Anorexia x2 days.  Dysphagia.  ENDOCRINE:  Cold intolerance.  MSK:  Joint pain.  NEURO:  Weakness.  PSYCH:  Depression.   PHYSICAL EXAMINATION:  VITALS:  Temperature 100.8 Fahrenheit, pulse 141,  respiratory rate 28, blood pressure 139/78, oxygen saturation 93% on room  air.  GENERAL:  Patient is lying in bed.  Appears uncomfortable but in no acute  distress.  HEENT:  Eyes:  Pupils are equal, round and reactive to light.  No scleral  icterus.  ENT:  Throat has small amount of thrush.  Base of tongue positive  for thrush.  NECK:  Supple.  No lymphadenopathy or thyromegaly.  RESPIRATORY:  Tachypneic at 36 breaths per minute.  Coarse breath sounds  throughout.  No wheezing, rales, or rhonchi appreciated.  (Per Dr. Olena Leatherwood,  decreased breath sounds at the left base, positive egophony at the left  base, dullness to percussion at the left base).  CARDIOVASCULAR:  Tachycardic at 140 beats per minute.  Regular rhythm.  Normal S1 and S2.  No murmurs, rubs or gallops.  GI:  Normoactive bowel sounds.  Soft.  No masses appreciated.  Marked  tenderness to palpation of the left upper quadrant.  EXTREMITIES:  Peripheral pulses 2+.  No peripheral edema.  Digital clubbing  of the fingers.  NEUROLOGIC:  Alert.  Cranial nerves II-XII are intact grossly.  PSYCH:  Depressed affect.   LABORATORY/CLINICAL DATA:  White blood cell count 21.1, hemoglobin 14.7,  hematocrit 45.1, platelets 141.  ANC 19.9 (94%).  Lymphocytes 0.9 (1.4%).  Monos 2%.  MCV 81.  Sodium 137, potassium 4.0, chloride 107, bicarb 22, BUN 14, creatinine  1.2, glucose 89, calcium 8.5, anion gap 9.   Total bili 1.0,  alk phos 67, SGOT 47, SGPT 37.  Total protein 6.9, albumin 3.0.  Urine drug  screen positive THC.  Negative all other studies.  Urinalysis:  Color amber.  Appearance clear.  Specific gravity greater than 1.040.  The pH 6.5.  Glucose negative.  Bili small.  Ketones 15.  Blood trace.  Protein 100.  Urobili 1.0.  Nitrites negative.  Leukocyte negative.  Squamous  epithelium/LPS rare.  White blood cell count/HPS 0-2, bacteria/HPS rare.  Urine bili negative.   HOSPITAL COURSE:  The patient was admitted to isolation for left lower lobe  pneumonia.  On admission, the patient was very ill-appearing, tachycardic,  and tachypneic.  Supplemental O2 by nasal cannula on 2 liters was provided.  The patient was empirically treated with Rocephin and Zithromax, and blood  cultures, sputum cultures, and AFB cultures were obtained.  His sputum  culture was positive for a few gram-positive cocci in pairs, rare gram-  positive rods.  Moderate white blood cells were present in both PMNs and  mononuclear cells.  His blood cultures were obtained.  Two of two blood  cultures were demonstrated strep pneumoniae.  His AFB culture was negative.  The sensitivities of his blood culture were sensitive to ceftriaxone,  levofloxacin, and penicillin.  The patient was removed from isolation.  Rocephin and Zithromax were discontinued, and amoxicillin was started.  The  patient gradually improved throughout his hospital stay.  On February 16th,  the patient's labs demonstrated decreased sodium, decreased potassium, and  decreased calcium.  K-Dur was started, and the patient's decreased potassium  resolved.  His sodium and calcium also increased.  The patient's white blood  cell count also trended down.   Problem 1:  Pneumonia:  We admitted the patient to a regular in isolation  for left lower lobe pneumonia.  When pneumococcal pneumonia was diagnosed,  we discontinued isolation and began appropriate  treatment more specific to  his pathogen.  Mr. Barfield appears to have developed community-acquired  pneumonia.  His CD4 count two weeks ago was greater than 200, which  indicates that he was likely susceptible to pathogens that affect  immunocompromised individuals.  We will continue outpatient treatment with  amoxicillin for five days.  The patient has increased air movement in the  left lower lobe.  Pneumonia appears to be resolving.   Problem 2:  Bacteremia:  Mr. Marian' bacteremia appears secondary to his  pneumococcal pneumonia infection.  Amoxicillin will also treat his current  bacteremia.   Problem 3:  Thrush:  The patient had thrush at the base of his tongue and  the back of his oropharynx.  The patient was treated with nystatin.  The  patient was then instructed to continue nystatin until two days after  symptoms have resolved.   Problem 4:  HIV:  Jacob Joseph has been off of his HAART for one year for financial reasons.  He has a declining CD4 count and increased viral load.  Per Dr. Orvan Falconer, we will start his previous regimen of Videx, Ziagen, and  Sustiva when the patient has been approved through [facility].   Problem 5:  Hemoptysis:  Jacob Joseph has had hemoptysis since two days  prior to admission.  It is most likely secondary to pneumonia; however,  there are many other causes of hemoptysis, such as lung cancer, abscess,  tuberculosis, pulmonary embolus, and bronchiectasis.  The hemoptysis  resolved with the resolution  of his pneumonia and his left lower lobe  bronchus cleared of debris and narrowing on a repeat CT, then pneumonia is  the likely source.  We will recommend a repeat CT at a later time to  evaluate his initial left lower lobe findings.  The patient does report  decreased hemoptysis.   Problem 6:  Hypertension:  The patient's blood pressure was elevated during  his hospital course.  We started him on Norvasc 10 mg q.d. initially.  We  discontinued  Norvasc and started him on hydrochlorothiazide 25 mg q.d.  We  will follow up with his blood pressure in outpatient clinic.   Problem 7:  Hepatitis C:  The patient's SGOT is mildly elevated.  SGPT was  within normal limits.  His albumin was decreased.  These may be signs of  mild liver damage but currently does not indicate severe liver dysfunction.  We will continue to monitor his liver functions and encourage followup as an  outpatient.   Problem 8:  Polysubstance abuse:  The patient continues to smoke and use  marijuana.  He reports cessation of the use of other illicit drugs.  We have  offered the numbers to substance abuse programs. We have also encouraged  smoking cessation.   Problem 9:  History of tuberculosis infection:  Due to the patient's history  of tuberculosis infection and declining CD4 count, the patient was suspected  of having tuberculosis.  The patient's AFB culture was negative, but  tuberculosis was a likely etiology of his current illness.   Problem 10:  Depression:  Patient reports up-and-down feelings of sadness  and depression; however, he does not desire medication at this time.  We  will follow up with his diagnosis of depression as an outpatient.   Problem 11:  Pain:  Jacob Joseph reported left shoulder, left chest, and  left upper quadrant abdominal pain.  For pain control, we started Tylenol  650 mg p.o. q.6h. p.r.n. and ibuprofen 400 mg p.o. q.4-6h. as needed.  Patient's pain is likely due to left lower lobe pneumonia.  Abdominal pain  could have been due to diaphragmatic irritation.  There are several possible  causes of left shoulder pain.  We will follow up with his pain in hospital  followup.   DISCHARGE LABS:  Sodium 138, potassium 3.7, chloride 104, bicarb 26, BUN 10,  glucose 109, creatinine 0.9, calcium 8.7.  White blood cell count 3.9,  hemoglobin 14.4, hematocrit 43.2, platelets 186.      Kerby Nora, MD                           Alvester Morin, M.D.    AB/MEDQ  D:  09/07/2003  T:  09/09/2003  Job:  [id number]   cc:   Outpatient clinic

## 2011-01-27 ENCOUNTER — Other Ambulatory Visit: Payer: Self-pay

## 2011-01-29 ENCOUNTER — Other Ambulatory Visit (INDEPENDENT_AMBULATORY_CARE_PROVIDER_SITE_OTHER): Payer: Self-pay

## 2011-01-29 ENCOUNTER — Inpatient Hospital Stay (INDEPENDENT_AMBULATORY_CARE_PROVIDER_SITE_OTHER)
Admission: RE | Admit: 2011-01-29 | Discharge: 2011-01-29 | Disposition: A | Discharge status: Home / Self Care | Payer: Self-pay | Source: Ambulatory Visit | Attending: Family Medicine | Admitting: Family Medicine

## 2011-01-29 DIAGNOSIS — R059 Cough, unspecified: Secondary | ICD-10-CM

## 2011-01-29 DIAGNOSIS — R05 Cough: Secondary | ICD-10-CM

## 2011-01-29 DIAGNOSIS — Z113 Encounter for screening for infections with a predominantly sexual mode of transmission: Secondary | ICD-10-CM

## 2011-01-29 DIAGNOSIS — M545 Low back pain, unspecified: Secondary | ICD-10-CM

## 2011-01-29 DIAGNOSIS — B20 Human immunodeficiency virus [HIV] disease: Secondary | ICD-10-CM

## 2011-01-30 LAB — HIV-1 RNA QUANT-NO REFLEX-BLD
HIV 1 RNA Quant: <20 copies/mL (ref <20)
HIV-1 RNA Quant, Log: <1.3 {Log} (ref <1.30)

## 2011-01-30 LAB — T-HELPER CELL (CD4) - (RCID CLINIC ONLY): CD4 % Helper T Cell: 22 % — ABNORMAL LOW (ref 33–55)

## 2011-02-10 ENCOUNTER — Ambulatory Visit (INDEPENDENT_AMBULATORY_CARE_PROVIDER_SITE_OTHER): Payer: Self-pay | Admitting: Internal Medicine

## 2011-02-10 ENCOUNTER — Encounter: Payer: Self-pay | Admitting: Internal Medicine

## 2011-02-10 ENCOUNTER — Ambulatory Visit: Payer: Self-pay

## 2011-02-10 DIAGNOSIS — F17200 Nicotine dependence, unspecified, uncomplicated: Secondary | ICD-10-CM

## 2011-02-10 DIAGNOSIS — B356 Tinea cruris: Secondary | ICD-10-CM

## 2011-02-10 DIAGNOSIS — B20 Human immunodeficiency virus [HIV] disease: Secondary | ICD-10-CM

## 2011-02-10 DIAGNOSIS — F1910 Other psychoactive substance abuse, uncomplicated: Secondary | ICD-10-CM

## 2011-02-10 MED ORDER — FLUCONAZOLE 100 MG PO TABS: 100.0000 mg | ORAL_TABLET | Freq: Every day | ORAL | Status: DC

## 2011-02-10 NOTE — Assessment & Plan Note (Signed)
He probably has jock itch. I will treat him with a two-week course of oral fluconazole and asked him to obtain some over-the-counter antifungal powder.

## 2011-02-10 NOTE — Assessment & Plan Note (Signed)
His infection remains under excellent control. I will continue his current regimen. 

## 2011-02-10 NOTE — Progress Notes (Signed)
  Subjective:    Patient ID: Jacob Joseph, male    DOB: May 19, 1956, 55 y.o.   MRN: [id number]  HPI Jacob Joseph is in for his routine visit. He has not missed any of his medications since last visit. He did attend one NA meeting recently and says that he has only used cocaine on 2 occasions since his last visit which is much less frequently than before. He did go to the urgent care or sooner about 2 weeks ago because he had developed a productive cough. He was unable to fill the prescription for his Z-Pak. He still has a productive cough but no fever, chills, or shortness of breath. He continues to smoke cigarettes but wants to quit.  He has been bothered by a pruritic rash in his groin for the last 2 months.    Review of Systems     Objective:   Physical Exam  Constitutional: He appears well-developed and well-nourished. No distress.  HENT:  Mouth/Throat: Oropharynx is clear and moist. No oropharyngeal exudate.  Cardiovascular: Normal heart sounds.   No murmur heard. Pulmonary/Chest: Breath sounds normal. He has no wheezes. He has no rales.  Genitourinary:       He has a scaly, slightly moist rash with hyperpigmentation his groin.  Psychiatric: He has a normal mood and affect.          Assessment & Plan:

## 2011-02-10 NOTE — Assessment & Plan Note (Signed)
I encouraged him to continue to attend NA meetings.

## 2011-02-10 NOTE — Assessment & Plan Note (Signed)
I gave him the telephone number for the [facility] quit line.

## 2011-02-18 ENCOUNTER — Ambulatory Visit: Payer: Self-pay

## 2011-03-19 ENCOUNTER — Ambulatory Visit: Payer: Self-pay

## 2011-04-08 LAB — T-HELPER CELL (CD4) - (RCID CLINIC ONLY): CD4 T Cell Abs: 420

## 2011-04-16 LAB — T-HELPER CELL (CD4) - (RCID CLINIC ONLY): CD4 % Helper T Cell: 16 — ABNORMAL LOW

## 2011-04-20 LAB — T-HELPER CELL (CD4) - (RCID CLINIC ONLY): CD4 % Helper T Cell: 19 — ABNORMAL LOW

## 2011-04-20 LAB — POCT CARDIAC MARKERS
CKMB, poc: 1.6
Troponin i, poc: <0.05

## 2011-04-20 LAB — DIFFERENTIAL
Eosinophils Relative: 4
Lymphocytes Relative: 39
Lymphs Abs: 2.5
Monocytes Relative: 8

## 2011-04-20 LAB — POCT I-STAT, CHEM 8
BUN: 12
Creatinine, Ser: 1.4
Potassium: 3.6
Sodium: 139
TCO2: 20

## 2011-04-20 LAB — CBC
HCT: 38.2 — ABNORMAL LOW
Platelets: 213
RBC: 3.72 — ABNORMAL LOW
WBC: 6.3

## 2011-04-28 LAB — T-HELPER CELL (CD4) - (RCID CLINIC ONLY): CD4 T Cell Abs: 380 — ABNORMAL LOW

## 2011-05-05 ENCOUNTER — Other Ambulatory Visit: Payer: Self-pay

## 2011-05-19 ENCOUNTER — Ambulatory Visit: Payer: Self-pay | Admitting: Internal Medicine

## 2011-05-20 ENCOUNTER — Other Ambulatory Visit: Payer: Self-pay

## 2011-06-02 ENCOUNTER — Ambulatory Visit: Payer: Self-pay | Admitting: Internal Medicine

## 2011-06-15 ENCOUNTER — Other Ambulatory Visit: Payer: Self-pay | Admitting: *Deleted

## 2011-06-15 DIAGNOSIS — B20 Human immunodeficiency virus [HIV] disease: Secondary | ICD-10-CM

## 2011-06-15 MED ORDER — TENOFOVIR DISOPROXIL FUMARATE 300 MG PO TABS: 300.0000 mg | ORAL_TABLET | Freq: Every day | ORAL | Status: DC

## 2011-06-15 MED ORDER — LAMIVUDINE-ZIDOVUDINE 150-300 MG PO TABS: 1.0000 | ORAL_TABLET | Freq: Two times a day (BID) | ORAL | Status: DC

## 2011-06-15 MED ORDER — LOPINAVIR-RITONAVIR 200-50 MG PO TABS: 2.0000 | ORAL_TABLET | Freq: Two times a day (BID) | ORAL | Status: DC

## 2011-06-18 ENCOUNTER — Other Ambulatory Visit: Payer: Self-pay | Admitting: Licensed Clinical Social Worker

## 2011-06-18 ENCOUNTER — Other Ambulatory Visit: Payer: Self-pay | Admitting: *Deleted

## 2011-06-18 DIAGNOSIS — B20 Human immunodeficiency virus [HIV] disease: Secondary | ICD-10-CM

## 2011-06-18 MED ORDER — LOPINAVIR-RITONAVIR 200-50 MG PO TABS: 2.0000 | ORAL_TABLET | Freq: Two times a day (BID) | ORAL | Status: DC

## 2011-06-18 MED ORDER — LAMIVUDINE-ZIDOVUDINE 150-300 MG PO TABS: 1.0000 | ORAL_TABLET | Freq: Two times a day (BID) | ORAL | Status: DC

## 2011-06-18 MED ORDER — TENOFOVIR DISOPROXIL FUMARATE 300 MG PO TABS: 300.0000 mg | ORAL_TABLET | Freq: Every day | ORAL | Status: DC

## 2011-06-25 ENCOUNTER — Other Ambulatory Visit (INDEPENDENT_AMBULATORY_CARE_PROVIDER_SITE_OTHER): Payer: Self-pay

## 2011-06-25 ENCOUNTER — Other Ambulatory Visit: Payer: Self-pay | Admitting: Internal Medicine

## 2011-06-25 DIAGNOSIS — B20 Human immunodeficiency virus [HIV] disease: Secondary | ICD-10-CM

## 2011-06-30 ENCOUNTER — Telehealth: Payer: Self-pay | Admitting: *Deleted

## 2011-06-30 NOTE — Telephone Encounter (Signed)
Jacob Joseph, a nurse practitioner called  [phone]) asking if his md here was ok with him switching to seroquel from Abilify.  Dr. Orvan Falconer was ok with this. The NP states his abilify was a very low dose & she was stopping this and tapering up the seroquel with a target dose of 300mg  per day. She was also d/c ing the trazadone.

## 2011-06-30 NOTE — Telephone Encounter (Signed)
The NP also mentioned that md may want an EKG due to seroquel & his HIV meds. Sent to Dr. Orvan Falconer

## 2011-07-01 NOTE — Telephone Encounter (Signed)
Yes, please have Jacob Joseph come in for an EKG.

## 2011-08-03 ENCOUNTER — Emergency Department (HOSPITAL_COMMUNITY)
Admission: EM | Admit: 2011-08-03 | Discharge: 2011-08-03 | Discharge status: Against Medical Advice | Payer: Self-pay | Attending: Emergency Medicine | Admitting: Emergency Medicine

## 2011-08-03 DIAGNOSIS — K4090 Unilateral inguinal hernia, without obstruction or gangrene, not specified as recurrent: Secondary | ICD-10-CM | POA: Insufficient documentation

## 2011-08-03 NOTE — ED Provider Notes (Signed)
56 year old male has a painless mass in the right inguinal area. On exam, there is a moderately large direct inguinal hernia which is easily reducible. I have discussed with the patient is options of wearing a truss versus surgical repair and he will be referred to [facility] surgery for surgical opinion.  Dione Booze, MD 08/03/11 1115

## 2011-08-03 NOTE — ED Notes (Signed)
Pt left after assessment with PA and prior to triage

## 2011-08-03 NOTE — ED Provider Notes (Signed)
History     CSN: [id number]  Arrival date & time 08/03/11  1031   First MD Initiated Contact with Patient 08/03/11 1044      No chief complaint on file.   (Consider location/radiation/quality/duration/timing/severity/associated sxs/prior treatment) HPI  Pt presents to the ED with complaints of a mass to his right upper suprapubic area. The patient denies it causing any pain, the patient denies recent weight loss, fevers, or urinary/bowel symptoms. The patient states it started 1 months ago and it has continued grow Pt is not in any acute distress.    No past medical history on file.  No past surgical history on file.  No family history on file.  History  Substance Use Topics  . Smoking status: Current Everyday Smoker -- 0.5 packs/day for 41 years    Types: Cigarettes  . Smokeless tobacco: Never Used  . Alcohol Use: 1.0 oz/week    2 drink(s) per week      Review of Systems  All other systems reviewed and are negative.    Allergies  Review of patient's allergies indicates no known allergies.  Home Medications   Current Outpatient Rx  Name Route Sig Dispense Refill  . ARIPIPRAZOLE 15 MG PO TABS Oral Take 7.5 mg by mouth daily.    [name] Kitchen LAMIVUDINE-ZIDOVUDINE 150-300 MG PO TABS Oral Take 1 tablet by mouth 2 (two) times daily. 60 tablet prn  . LOPINAVIR-RITONAVIR 200-50 MG PO TABS Oral Take 2 tablets by mouth 2 (two) times daily. 120 tablet prn  . QUETIAPINE FUMARATE 100 MG PO TABS Oral Take 100 mg by mouth at bedtime.    . TENOFOVIR DISOPROXIL FUMARATE 300 MG PO TABS Oral Take 1 tablet (300 mg total) by mouth daily. 30 tablet prn  . LISINOPRIL-HYDROCHLOROTHIAZIDE 20-25 MG PO TABS Oral Take 1 tablet by mouth daily. 90 tablet 3    There were no vitals taken for this visit.  Physical Exam  Constitutional: He is oriented to person, place, and time. He appears well-developed and well-nourished.  HENT:  Head: Normocephalic and atraumatic.  Eyes: EOM are normal. Pupils  are equal, round, and reactive to light.  Neck: Normal range of motion.  Cardiovascular: Normal rate and regular rhythm.   Pulmonary/Chest: Effort normal and breath sounds normal.  Abdominal: Soft. Bowel sounds are normal. He exhibits no distension and no mass. There is no tenderness. There is no rebound and no guarding. A hernia is present.    Musculoskeletal: Normal range of motion.  Neurological: He is alert and oriented to person, place, and time.  Skin: Skin is warm and dry.    ED Course  Procedures (including critical care time)  Labs Reviewed - No data to display No results found.   1. Direct inguinal hernia       MDM  Pt was evaluated by Dr. Preston Fleeting and myself. Dr. Preston Fleeting discussed surgery versus symptomatic treatment. PT voiced understanding.        Dorthula Matas, PA 08/03/11 1123

## 2011-08-04 ENCOUNTER — Ambulatory Visit: Payer: Self-pay | Admitting: Internal Medicine

## 2011-08-04 NOTE — ED Provider Notes (Signed)
Medical screening examination/treatment/procedure(s) were conducted as a shared visit with non-physician practitioner(s) and myself.  I personally evaluated the patient during the encounter   Dione Booze, MD 08/04/11 [phone]

## 2011-08-18 ENCOUNTER — Telehealth: Payer: Self-pay | Admitting: *Deleted

## 2011-08-18 NOTE — Telephone Encounter (Signed)
Patient walked into clinic requesting refill on B/P med, stated he has not taken the medication in a year. Checked B/P 148/88, he has an appointment with Dr. Orvan Falconer on 08/24/11 and will be addressed at that time. Wendall Mola CMA

## 2011-08-24 ENCOUNTER — Ambulatory Visit (INDEPENDENT_AMBULATORY_CARE_PROVIDER_SITE_OTHER): Payer: Self-pay | Admitting: Internal Medicine

## 2011-08-24 ENCOUNTER — Encounter: Payer: Self-pay | Admitting: Internal Medicine

## 2011-08-24 VITALS — BP 158/100 | HR 73 | Temp 98.4°F | Ht 72.0 in | Wt 196.5 lb

## 2011-08-24 DIAGNOSIS — B20 Human immunodeficiency virus [HIV] disease: Secondary | ICD-10-CM

## 2011-08-24 DIAGNOSIS — K4091 Unilateral inguinal hernia, without obstruction or gangrene, recurrent: Secondary | ICD-10-CM | POA: Insufficient documentation

## 2011-08-24 DIAGNOSIS — I10 Essential (primary) hypertension: Secondary | ICD-10-CM

## 2011-08-24 MED ORDER — HYDROCHLOROTHIAZIDE 25 MG PO TABS: 25.0000 mg | ORAL_TABLET | Freq: Every day | ORAL | Status: DC

## 2011-08-24 MED ORDER — LISINOPRIL 20 MG PO TABS: 20.0000 mg | ORAL_TABLET | Freq: Every day | ORAL | Status: DC

## 2011-08-24 NOTE — Progress Notes (Signed)
Patient ID: Jacob Joseph, male   DOB: Dec 16, 1955, 56 y.o.   MRN: [id number]  INFECTIOUS DISEASE PROGRESS NOTE    Subjective: Jacob Joseph is in for his routine visit. He denies missing any of his HIV medications since his last visit. He states it is not used any cocaine in 32 days. He is having some more low back pain recently. He said he was doing his stretching exercises before the pain recurred. He has not had any recent injury to his back. He is been unable to afford his blood pressure medications. Several months ago he began to notice some swelling in his right groin. He was in the emergency department with a friend who was being checked out about another problem and mentioned it to the triage nurse. It appears that he was checked in but never fully examined.  Objective:   General: He is in good spirits and does not appear to be in any distress Skin: No rash Lungs:  clear Cor: Regular S1-S2 no murmurs Abdomen: He has a soft compressible right inguinal hernia. It is nontender. Has some mild tenderness in his lower back in the midline. His gait is normal.  Lab Results Lab Results  Component Value Date   WBC 6.3 08/14/2010   HGB 11.1* 08/14/2010   HCT 32.5* 08/14/2010   MCV 83.3 08/14/2010   PLT 244 08/14/2010    Lab Results  Component Value Date   CREATININE 1.41 08/14/2010   BUN 13 08/14/2010   NA 138 08/14/2010   K 3.8 08/14/2010   CL 110 08/14/2010   CO2 19 08/14/2010    Lab Results  Component Value Date   ALT 30 08/14/2010   AST 34 08/14/2010   ALKPHOS 75 08/14/2010   BILITOT 0.3 08/14/2010      HIV 1 RNA Quant (copies/mL)  Date Value  06/25/2011 <20   01/29/2011 <20   10/27/2010 26*     CD4 T Cell Abs (cmm)  Date Value  06/25/2011 290*  01/29/2011 440   10/27/2010 580     Assessment: His HIV infection remains under excellent control. I have encouraged him to maintain his sobriety off of cocaine. We will be able to get his blood pressure medication from [facility]. We'll start the process  of making a Gen. surgery referral for his inguinal hernia.  Plan: 1. Continue current antiretroviral regimen 2. Maintain sobriety 3. Gen. surgery referral 4. Restart blood pressure medications   Cliffton Asters, MD [facility] [phone] pager   [phone] cell 08/24/2011, 5:16 PM

## 2011-08-26 ENCOUNTER — Telehealth: Payer: Self-pay | Admitting: *Deleted

## 2011-08-26 NOTE — Telephone Encounter (Signed)
RN spoke [facility] Case Manager.  Asked that pt obtain ADAP renewal ASAP so that "orange card" application could be completed by B. Jeraldine Loots.  [facility] CM stated that she would reach out to the pt to obtain ADAP renewal.

## 2011-09-03 ENCOUNTER — Telehealth: Payer: Self-pay | Admitting: *Deleted

## 2011-09-03 ENCOUNTER — Ambulatory Visit: Payer: Self-pay

## 2011-09-03 DIAGNOSIS — K4091 Unilateral inguinal hernia, without obstruction or gangrene, recurrent: Secondary | ICD-10-CM

## 2011-09-03 NOTE — Telephone Encounter (Signed)
Paperwork for [facility] referral being faxed to [facility] at there office.  Pt received [employer] today.  B.Jeraldine Loots will be faxing information to [facility] the first of next week so that the pt's referral can be processed.

## 2011-11-04 ENCOUNTER — Encounter: Payer: Self-pay | Admitting: *Deleted

## 2011-11-04 NOTE — Progress Notes (Signed)
Patient ID: Jacob Joseph, male   DOB: Apr 08, 1956, 56 y.o.   MRN: [id number]  Faxed referral to [facility] at [facility] on 09/03/11 for a general surgeon for inguinal hernia. Response back from stephanie waiting for Britta Mccreedy to fax hi enrollment info to her once he becomes eligible. Pt is now eligible and Judeth Cornfield is going to call [facility] 11/05/2011 to get him scheduled and she will be faxing the appt info to [place]. Tacey Heap RN

## 2011-11-05 ENCOUNTER — Encounter: Payer: Self-pay | Admitting: *Deleted

## 2011-11-05 NOTE — Progress Notes (Signed)
Patient ID: Jacob Joseph, male   DOB: 11-23-1955, 56 y.o.   MRN: [id number]  Judeth Cornfield from [facility] had called back to inform me of Pt appt with Dr. Michaell Cowing at [facility] on Nov 30, 2011 @ 8:15. She did say that the patient will need to call [facility] financial dept to work out payment of $226.[name] KitchenJudeth Cornfield stated [facility] also has some kind of financial program thru them that can help with this. She is sending information to patient about appt and I will also call patient to inform him as well. Tacey Heap RN

## 2011-11-10 ENCOUNTER — Other Ambulatory Visit: Payer: Self-pay

## 2011-11-18 DIAGNOSIS — I639 Cerebral infarction, unspecified: Secondary | ICD-10-CM

## 2011-11-18 HISTORY — DX: Cerebral infarction, unspecified: I63.9

## 2011-11-24 ENCOUNTER — Telehealth: Payer: Self-pay | Admitting: *Deleted

## 2011-11-24 ENCOUNTER — Encounter: Payer: Self-pay | Admitting: *Deleted

## 2011-11-24 ENCOUNTER — Ambulatory Visit: Payer: Self-pay | Admitting: Internal Medicine

## 2011-11-24 NOTE — Telephone Encounter (Signed)
Unable to leave message on phone.  Does not allow messages.  Will send a letter.

## 2011-11-24 NOTE — Telephone Encounter (Signed)
Previous phone number incorrect.  RN was able to reach pt through correct phone number on wife's record.  Made new lab work and MD appts for the patient

## 2011-11-26 DIAGNOSIS — Z79899 Other long term (current) drug therapy: Secondary | ICD-10-CM

## 2011-11-26 DIAGNOSIS — B20 Human immunodeficiency virus [HIV] disease: Secondary | ICD-10-CM

## 2011-11-26 DIAGNOSIS — Z113 Encounter for screening for infections with a predominantly sexual mode of transmission: Secondary | ICD-10-CM

## 2011-11-27 ENCOUNTER — Other Ambulatory Visit: Payer: Self-pay

## 2011-11-30 ENCOUNTER — Encounter: Payer: Self-pay | Admitting: *Deleted

## 2011-11-30 ENCOUNTER — Other Ambulatory Visit (INDEPENDENT_AMBULATORY_CARE_PROVIDER_SITE_OTHER): Payer: Self-pay

## 2011-11-30 ENCOUNTER — Ambulatory Visit (INDEPENDENT_AMBULATORY_CARE_PROVIDER_SITE_OTHER): Payer: Self-pay | Admitting: Surgery

## 2011-11-30 DIAGNOSIS — B20 Human immunodeficiency virus [HIV] disease: Secondary | ICD-10-CM

## 2011-11-30 LAB — CBC
HCT: 39.5 % (ref 39.0–52.0)
Hemoglobin: 12.8 g/dL — ABNORMAL LOW (ref 13.0–17.0)
WBC: 7.3 10*3/uL (ref 4.0–10.5)

## 2011-11-30 LAB — COMPLETE METABOLIC PANEL WITH GFR
AST: 56 U/L — ABNORMAL HIGH (ref 0–37)
Albumin: 4 g/dL (ref 3.5–5.2)
BUN: 12 mg/dL (ref 6–23)
CO2: 23 mEq/L (ref 19–32)
Calcium: 9.5 mg/dL (ref 8.4–10.5)
Chloride: 105 mEq/L (ref 96–112)
GFR, Est Non African American: 63 mL/min
Glucose, Bld: 102 mg/dL — ABNORMAL HIGH (ref 70–99)
Potassium: 4.4 mEq/L (ref 3.5–5.3)

## 2011-11-30 LAB — LIPID PANEL
Cholesterol: 117 mg/dL (ref 0–200)
VLDL: 23 mg/dL (ref 0–40)

## 2011-11-30 NOTE — Progress Notes (Signed)
Patient ID: Jacob Joseph, male   DOB: 12/09/1955, 56 y.o.   MRN: [id number]  After speaking with patient, he told me that he could not afford to pay the $226 owed to [facility] because his wife is also having surgery and has left them with little monies. Pt was under the impression that the [facility] card paid 100%. I informed him that it helped pay and certain percentage and he would still have a balance owed. Pt verbalized understanding and since he cannot pay the money owed to [facility] I have referred him to [facility]: General Surgery [phone]) for his inguinal hernia and he has an appointment for Monday, 12/07/11 @ 10am. It is located in the [facility] on 5th floor of the [facility]. I have spoke to patient and sent letter as a reminder. Tacey Heap RN

## 2011-12-01 LAB — HIV-1 RNA QUANT-NO REFLEX-BLD: HIV-1 RNA Quant, Log: <1.3 {Log} (ref <1.30)

## 2011-12-01 LAB — T-HELPER CELL (CD4) - (RCID CLINIC ONLY)
CD4 % Helper T Cell: 21 % — ABNORMAL LOW (ref 33–55)
CD4 T Cell Abs: 410 uL (ref 400–2700)

## 2011-12-03 ENCOUNTER — Encounter (HOSPITAL_COMMUNITY): Payer: Self-pay | Admitting: Physical Medicine and Rehabilitation

## 2011-12-03 ENCOUNTER — Emergency Department (HOSPITAL_COMMUNITY): Payer: Medicaid Other

## 2011-12-03 ENCOUNTER — Inpatient Hospital Stay (HOSPITAL_COMMUNITY)
Admission: EM | Admit: 2011-12-03 | Discharge: 2011-12-08 | DRG: 065 | Disposition: A | Discharge status: Home / Self Care | Payer: Medicaid Other | Attending: Internal Medicine | Admitting: Internal Medicine

## 2011-12-03 DIAGNOSIS — R82998 Other abnormal findings in urine: Secondary | ICD-10-CM | POA: Diagnosis not present

## 2011-12-03 DIAGNOSIS — I6350 Cerebral infarction due to unspecified occlusion or stenosis of unspecified cerebral artery: Principal | ICD-10-CM | POA: Diagnosis present

## 2011-12-03 DIAGNOSIS — Z8249 Family history of ischemic heart disease and other diseases of the circulatory system: Secondary | ICD-10-CM

## 2011-12-03 DIAGNOSIS — R5383 Other fatigue: Secondary | ICD-10-CM | POA: Diagnosis present

## 2011-12-03 DIAGNOSIS — E785 Hyperlipidemia, unspecified: Secondary | ICD-10-CM | POA: Diagnosis present

## 2011-12-03 DIAGNOSIS — F339 Major depressive disorder, recurrent, unspecified: Secondary | ICD-10-CM | POA: Diagnosis present

## 2011-12-03 DIAGNOSIS — F1420 Cocaine dependence, uncomplicated: Secondary | ICD-10-CM | POA: Diagnosis present

## 2011-12-03 DIAGNOSIS — F17200 Nicotine dependence, unspecified, uncomplicated: Secondary | ICD-10-CM | POA: Diagnosis present

## 2011-12-03 DIAGNOSIS — F3342 Major depressive disorder, recurrent, in full remission: Secondary | ICD-10-CM | POA: Diagnosis present

## 2011-12-03 DIAGNOSIS — F3289 Other specified depressive episodes: Secondary | ICD-10-CM | POA: Diagnosis present

## 2011-12-03 DIAGNOSIS — I639 Cerebral infarction, unspecified: Secondary | ICD-10-CM

## 2011-12-03 DIAGNOSIS — F329 Major depressive disorder, single episode, unspecified: Secondary | ICD-10-CM | POA: Diagnosis present

## 2011-12-03 DIAGNOSIS — R5381 Other malaise: Secondary | ICD-10-CM | POA: Diagnosis present

## 2011-12-03 DIAGNOSIS — I059 Rheumatic mitral valve disease, unspecified: Secondary | ICD-10-CM | POA: Diagnosis present

## 2011-12-03 DIAGNOSIS — B20 Human immunodeficiency virus [HIV] disease: Secondary | ICD-10-CM | POA: Diagnosis present

## 2011-12-03 DIAGNOSIS — F1910 Other psychoactive substance abuse, uncomplicated: Secondary | ICD-10-CM | POA: Diagnosis present

## 2011-12-03 DIAGNOSIS — Z21 Asymptomatic human immunodeficiency virus [HIV] infection status: Secondary | ICD-10-CM

## 2011-12-03 DIAGNOSIS — E119 Type 2 diabetes mellitus without complications: Secondary | ICD-10-CM | POA: Diagnosis present

## 2011-12-03 DIAGNOSIS — Z8673 Personal history of transient ischemic attack (TIA), and cerebral infarction without residual deficits: Secondary | ICD-10-CM | POA: Diagnosis present

## 2011-12-03 DIAGNOSIS — I10 Essential (primary) hypertension: Secondary | ICD-10-CM | POA: Diagnosis present

## 2011-12-03 HISTORY — DX: Essential (primary) hypertension: I10

## 2011-12-03 HISTORY — DX: Asymptomatic human immunodeficiency virus (hiv) infection status: Z21

## 2011-12-03 HISTORY — DX: Major depressive disorder, single episode, unspecified: F32.9

## 2011-12-03 HISTORY — DX: Depression, unspecified: F32.A

## 2011-12-03 HISTORY — DX: Human immunodeficiency virus (HIV) disease: B20

## 2011-12-03 LAB — COMPREHENSIVE METABOLIC PANEL WITH GFR
ALT: 38 U/L (ref 0–53)
Alkaline Phosphatase: 77 U/L (ref 39–117)
BUN: 14 mg/dL (ref 6–23)
CO2: 19 meq/L (ref 19–32)
Calcium: 9.8 mg/dL (ref 8.4–10.5)
GFR calc Af Amer: 74 mL/min — ABNORMAL LOW (ref >90)
GFR calc non Af Amer: 64 mL/min — ABNORMAL LOW (ref >90)
Glucose, Bld: 113 mg/dL — ABNORMAL HIGH (ref 70–99)
Total Protein: 8.3 g/dL (ref 6.0–8.3)

## 2011-12-03 LAB — URINALYSIS, ROUTINE W REFLEX MICROSCOPIC
Glucose, UA: NEGATIVE mg/dL
Ketones, ur: NEGATIVE mg/dL
Leukocytes, UA: NEGATIVE
Nitrite: NEGATIVE
Protein, ur: 100 mg/dL — AB
Specific Gravity, Urine: 1.023 (ref 1.005–1.030)
Urobilinogen, UA: 4 mg/dL — ABNORMAL HIGH (ref 0.0–1.0)
pH: 6 (ref 5.0–8.0)

## 2011-12-03 LAB — APTT: aPTT: 27 seconds (ref 24–37)

## 2011-12-03 LAB — PROTIME-INR
INR: 1.07 (ref 0.00–1.49)
Prothrombin Time: 14.1 seconds (ref 11.6–15.2)

## 2011-12-03 LAB — CBC
HCT: 39.4 % (ref 39.0–52.0)
Hemoglobin: 13.4 g/dL (ref 13.0–17.0)
MCH: 33.3 pg (ref 26.0–34.0)
MCHC: 34 g/dL (ref 30.0–36.0)
MCV: 97.8 fL (ref 78.0–100.0)
Platelets: 203 10*3/uL (ref 150–400)
RBC: 4.03 MIL/uL — ABNORMAL LOW (ref 4.22–5.81)
RDW: 14.8 % (ref 11.5–15.5)
WBC: 6.3 K/uL (ref 4.0–10.5)

## 2011-12-03 LAB — RAPID URINE DRUG SCREEN, HOSP PERFORMED
Amphetamines: NOT DETECTED
Barbiturates: NOT DETECTED
Benzodiazepines: NOT DETECTED
Cocaine: POSITIVE — AB
Opiates: NOT DETECTED
Tetrahydrocannabinol: NOT DETECTED

## 2011-12-03 LAB — URINE MICROSCOPIC-ADD ON

## 2011-12-03 LAB — COMPREHENSIVE METABOLIC PANEL
AST: 64 U/L — ABNORMAL HIGH (ref 0–37)
Albumin: 3.7 g/dL (ref 3.5–5.2)
Chloride: 104 mEq/L (ref 96–112)
Creatinine, Ser: 1.25 mg/dL (ref 0.50–1.35)
Potassium: 3.7 mEq/L (ref 3.5–5.1)
Sodium: 138 mEq/L (ref 135–145)
Total Bilirubin: 0.5 mg/dL (ref 0.3–1.2)

## 2011-12-03 MED ORDER — HYDROCHLOROTHIAZIDE 25 MG PO TABS: 25.0000 mg | ORAL_TABLET | Freq: Every day | ORAL | Status: DC

## 2011-12-03 MED ORDER — LISINOPRIL 20 MG PO TABS: 20.0000 mg | ORAL_TABLET | Freq: Every day | ORAL | Status: DC

## 2011-12-03 NOTE — ED Notes (Signed)
Regular Diet tray ordered  

## 2011-12-03 NOTE — ED Provider Notes (Signed)
History     CSN: [id number]  Arrival date & time 12/03/11  1446   First MD Initiated Contact with Patient 12/03/11 1506      Chief Complaint  Patient presents with  . Cerebrovascular Accident    (Consider location/radiation/quality/duration/timing/severity/associated sxs/prior treatment) HPI Comments: Pt reports no HA now.  Difficulty speaking.  Weakness to right side, weak at arm and leg, difficulty walking.  Thought symptoms would improve on its own so didn't come to the ED.   No neck pain, back pain, fevers, chills, cough, URI symptoms, N/V/D.  No blurred vision.    Patient is a 56 y.o. male presenting with Acute Neurological Problem. The history is provided by the patient.  Cerebrovascular Accident The current episode started 2 days ago. The problem has been gradually worsening. Associated symptoms include headaches. Pertinent negatives include no chest pain and no abdominal pain. The symptoms are aggravated by nothing. The symptoms are relieved by nothing. He has tried nothing for the symptoms.    Past Medical History  Diagnosis Date  . Hypertension   . HIV (human immunodeficiency virus infection)   . Depression     History reviewed. No pertinent past surgical history.  History reviewed. No pertinent family history.  History  Substance Use Topics  . Smoking status: Current Everyday Smoker -- 0.5 packs/day for 41 years    Types: Cigarettes  . Smokeless tobacco: Never Used   Comment: interested in quiting  . Alcohol Use: 0.0 oz/week    0 drink(s) per week     last time 30 days ago per the pt      Review of Systems  Constitutional: Negative for fever and chills.  HENT: Negative for neck pain and neck stiffness.   Eyes: Negative for visual disturbance.  Respiratory: Negative for cough and chest tightness.   Cardiovascular: Negative for chest pain.  Gastrointestinal: Negative for nausea, vomiting, abdominal pain and diarrhea.  Musculoskeletal: Negative for back  pain.  Skin: Negative for rash.  Neurological: Positive for speech difficulty, weakness and headaches. Negative for dizziness and light-headedness.  All other systems reviewed and are negative.    Allergies  Review of patient's allergies indicates no known allergies.  Home Medications   Current Outpatient Rx  Name Route Sig Dispense Refill  . HYDROCHLOROTHIAZIDE 25 MG PO TABS Oral Take 25 mg by mouth daily.    [name] Kitchen LAMIVUDINE-ZIDOVUDINE 150-300 MG PO TABS Oral Take 1 tablet by mouth 2 (two) times daily.    [name] Kitchen LISINOPRIL 20 MG PO TABS Oral Take 20 mg by mouth daily.    [name] Kitchen LOPINAVIR-RITONAVIR 200-50 MG PO TABS Oral Take 2 tablets by mouth 2 (two) times daily.    [name] Kitchen MIRTAZAPINE 30 MG PO TABS Oral Take 45 mg by mouth at bedtime.    [name] Kitchen NICOTINE 21 MG/24HR TD PT24 Transdermal Place 1 patch onto the skin daily.    . QUETIAPINE FUMARATE 300 MG PO TABS Oral Take 300 mg by mouth at bedtime.    . TENOFOVIR DISOPROXIL FUMARATE 300 MG PO TABS Oral Take 300 mg by mouth daily.    [name] Kitchen ZOLPIDEM TARTRATE 5 MG PO TABS Oral Take 5 mg by mouth at bedtime as needed. For insomnia.      BP 117/76  Pulse 85  Temp(Src) 98.1 F (36.7 C) (Oral)  Resp 18  SpO2 99%  Physical Exam  Nursing note and vitals reviewed. Constitutional: He appears well-developed and well-nourished.  HENT:  Head: Normocephalic and atraumatic.  Eyes: Pupils are equal,  round, and reactive to light.  Neck: Normal range of motion. Neck supple. No tracheal deviation present. No thyromegaly present.  Cardiovascular: Normal rate and regular rhythm.  Exam reveals no gallop and no friction rub.   No murmur heard. Pulmonary/Chest: Effort normal. No respiratory distress. He has no wheezes.  Abdominal: Soft.  Neurological: He is alert. He displays no atrophy. He exhibits normal muscle tone. Coordination and gait abnormal.       Slight facial droop on right, arm drift on right, leg weakness on right   Skin: Skin is warm. No rash noted.    Psychiatric: His mood appears not anxious. His affect is not angry. His speech is delayed. He is slowed. He is not agitated. He exhibits normal recent memory. He is attentive.    ED Course  Procedures (including critical care time)  Labs Reviewed  COMPREHENSIVE METABOLIC PANEL - Abnormal; Notable for the following:    Glucose, Bld 113 (*)    AST 64 (*)    GFR calc non Af Amer 64 (*)    GFR calc Af Amer 74 (*)    All other components within normal limits  CBC - Abnormal; Notable for the following:    RBC 4.03 (*)    All other components within normal limits  URINALYSIS, ROUTINE W REFLEX MICROSCOPIC - Abnormal; Notable for the following:    Color, Urine AMBER (*) BIOCHEMICALS MAY BE AFFECTED BY COLOR   Hgb urine dipstick LARGE (*)    Bilirubin Urine SMALL (*)    Protein, ur 100 (*)    Urobilinogen, UA 4.0 (*)    All other components within normal limits  URINE MICROSCOPIC-ADD ON - Abnormal; Notable for the following:    Squamous Epithelial / LPF FEW (*)    Casts HYALINE CASTS (*)    All other components within normal limits  APTT  PROTIME-INR  URINE RAPID DRUG SCREEN (HOSP PERFORMED)   Ct Head Wo Contrast  12/03/2011  *RADIOLOGY REPORT*  Clinical Data: 2 days ago the patient woke up with right upper and lower extremity weakness along with difficulty speaking.  History of HIV.  History of diabetes.  History of hepatitis C.  CT HEAD WITHOUT CONTRAST  Technique:  Contiguous axial images were obtained from the base of the skull through the vertex without contrast.  Comparison: 04/16/2008.  Findings:  A linear hypodensity involving the left frontal cortex and subcortical white matter (image [id number], series 2)  is suspicious for acute infarction.No acute hemorrhage, mass lesion, hydrocephalus, or extra-axial fluid.  Mild atrophy.  Mild chronic microvascular ischemic change.  Calvarium intact.  No acute sinus or mastoid disease.  Compared with 2009, there is slight progression of atrophy.  The  left frontal hypodensity is new.  In this patient with history of HIV, an asymmetric intra-axial hypodensity as is seen in this scan could represent an opportunistic infection or even tumor although felt to be less likely.  If the history is not suggestive of a sudden onset event typical of an infarct, consider MRI brain without and with contrast for further evaluation.  IMPRESSION: Linear hypodensity left frontal cortex and subcortical white matter without significant hemorrhage or mass effect is suspicious for acute infarction. See comments above. Correlate clinically for other potential processes which might appear similar.  Slight progression of atrophy from priors.  Original Report Authenticated By: Elsie Stain, M.D.   I reviewed CT myself as well as results  1. Stroke   2. HIV (human immunodeficiency virus  infection)     RA sat is 98% and normal.    EKG at time 15:26 shows SR at rate 76, LAD, normal intervals, no ST or T wave abn's. Criteria for inf infarct as noted on EKG from 10/25/09 not definitively noted today.      5:13 PM Discussed with OPC who will see pt in the ED and admit.    MDM  Pt with stroke like symptoms, occurred 2 days ago per patient, not code stroke or TPA candidate.  No fever, but h/o HIV makes infection possible, although less likely with no fever, meningismus, and possibly tumor or cancer causing weakness.  Will get head CT, labs, admission.  Dr. Orvan Falconer with ID clinic is PCP.          Gavin Pound. Erian Rosengren, MD 12/03/11 [id number]

## 2011-12-03 NOTE — ED Notes (Signed)
Onset two days ago patient states woke up and had right upper and lower extremity weakness along with trouble getting words out.  Currently symptoms patient denies any pain calm cooperative. Airway intact bilateral equal chest rise and fall. Hand grasps weaker on the right side compared to the left.  Pupils equal and reactive +2 brisk.

## 2011-12-03 NOTE — H&P (Signed)
Hospital Admission Note Date: 12/04/2011  Patient name: Jacob Joseph Medical record number: [id number] Date of birth: January 04, 1956 Age: 56 y.o. Gender: male PCP: Cliffton Asters, MD, MD  Medical Service: [facility] teaching service  Attending physician: Dr. Blanch Media    1st Contact: Dr. Dierdre Searles                Pager:[phone] 2nd Contact: Dr. Allena Katz    Pager:[phone]  After 5 pm or weekends: 1st Contact:      Pager: [phone] 2nd Contact:      Pager: [phone]  Chief Complaint: right side weakness and slurred speech  History of Present Illness: This is a 56 year old man with PMH of HIV ( last CD4 410 and VL < 20 on 11/30/11), DM type II, HTN, and current Tobacco abuse who Presents with right-sided weakness and slurred speech.  Patient states that he noticed gradual onset right-sided weakness and numbness while resting on chair 2 days ago  His wife also noticed that he had some degree of slurred speech. Patient states that it was hard for him to find words.  He has had a transient blurry vision which resolved by the time he arrived in ED. He also reports intermittent dizziness, lightheadedness and vertigo for past two days.  Patient did not seek any medical attention because he thought that he would get better on his own.  Unfortunately, his symptoms persisted, and he came to ED early evaluation today.  His head CT showed ?  Acute left sided frontal infarct.   Medication List  As of 12/04/2011  6:39 AM   ASK your doctor about these medications         hydrochlorothiazide 25 MG tablet   Commonly known as: HYDRODIURIL   Take 25 mg by mouth daily.      lamiVUDine-zidovudine 150-300 MG per tablet   Commonly known as: COMBIVIR   Take 1 tablet by mouth 2 (two) times daily.      lisinopril 20 MG tablet   Commonly known as: PRINIVIL,ZESTRIL   Take 20 mg by mouth daily.      lopinavir-ritonavir 200-50 MG per tablet   Commonly known as: KALETRA   Take 2 tablets by mouth 2  (two) times daily.      mirtazapine 30 MG tablet   Commonly known as: REMERON   Take 45 mg by mouth at bedtime.      nicotine 21 mg/24hr patch   Commonly known as: NICODERM CQ - dosed in mg/24 hours   Place 1 patch onto the skin daily.      QUEtiapine 300 MG tablet   Commonly known as: SEROQUEL   Take 300 mg by mouth at bedtime.      tenofovir 300 MG tablet   Commonly known as: VIREAD   Take 300 mg by mouth daily.      zolpidem 5 MG tablet   Commonly known as: AMBIEN   Take 5 mg by mouth at bedtime as needed. For insomnia.            Allergies: Allergies as of 12/03/2011  . (No Known Allergies)   Past Medical History  Diagnosis Date  . Hypertension   . HIV (human immunodeficiency virus infection)   . Depression    History reviewed. No pertinent past surgical history. Family History  Problem Relation Age of Onset  . Hypertension Mother   . Hypertension Father    History   Social History  . Marital Status: Married  Spouse Name: N/A    Number of Children: N/A  . Years of Education: N/A   Occupational History  . Not on file.   Social History Main Topics  . Smoking status: Current Everyday Smoker -- 0.5 packs/day for 41 years    Types: Cigarettes  . Smokeless tobacco: Never Used   Comment: interested in quiting  . Alcohol Use: 0.0 oz/week    0 drink(s) per week     last time 30 days ago per the pt  . Drug Use: Yes    Special: "Crack" cocaine, Cocaine     last time 30 days ago per the pt  . Sexually Active: Yes -- Male partner(s)     given condoms   Other Topics Concern  . Not on file   Social History Narrative   Pt lives with wife and grandkid in [place].Has applied for disability and is pending.Worked in [place] before about 3 years ago.    Review of Systems: Review of Systems:  Constitutional:  Denies fever, chills, diaphoresis, positive for decreased appetite.   HEENT:  Denies congestion, sore throat, rhinorrhea, sneezing, mouth  sores, trouble swallowing, neck pain   Respiratory:  Denies SOB, DOE, cough, and wheezing.   Cardiovascular:  Denies palpitations and leg swelling.   Gastrointestinal:  Denies nausea, vomiting, abdominal pain, diarrhea, constipation, blood in stool and abdominal distention.   Genitourinary:  Denies dysuria, urgency, frequency, hematuria, flank pain and difficulty urinating.   Musculoskeletal:  Denies myalgias, back pain, joint swelling, arthralgias and gait problem.   Skin:  Denies pallor, rash and wound.   Neurological:   positive for dizziness, ight-headedness, weakness and numbness. Denies seizures, syncope, and headaches.    .    Physical Exam: Blood pressure 127/90, pulse 79, temperature 98 F (36.7 C), temperature source Oral, resp. rate 20, height 6' (1.829 m), weight 178 lb 9.2 oz (81 kg), SpO2 100.00%. General: alert, well-developed, and cooperative to examination.  Head: normocephalic and atraumatic.  Eyes: vision grossly intact, pupils equal, pupils round, pupils reactive to light, no injection and anicteric.  Mouth: pharynx pink and moist, no erythema, and no exudates.  Neck: supple, full ROM, no thyromegaly, no JVD, and no carotid bruits.  Lungs: normal respiratory effort, no accessory muscle use, normal breath sounds, no crackles, and no wheezes. Heart: normal rate, regular rhythm, no murmur, no gallop, and no rub.  Abdomen: soft, non-tender, normal bowel sounds, no distention, no guarding, no rebound tenderness, no hepatomegaly, and no splenomegaly.  Msk: no joint swelling, no joint warmth, and no redness over joints.  Pulses: 2+ DP/PT pulses bilaterally Extremities: No cyanosis, clubbing, edema Skin: turgor normal and no rashes.  Psych: Oriented X3, memory intact for recent and remote, normally interactive, good eye contact, not anxious appearing, and not depressed appearing. Neurology:alert & oriented X3, cranial nerves II-VI, VIII intact. Expressive aphagia. Right side  facial muscle weakness noted including weakness with raising eyebrow and closing right eye. Right facial droop noted. Right side sternocleidomastoid muscle and trapezius muscle weakness noted. His tongue deviate to right side. Right upper extremity muscle strength 2-3/5 with diminished reflexes. Right lower extremity strength 3/5 with hyper active reflexes. Babinskii positive. Right limbs decreased sensation noted. Right side cerebellar exams exam were inconclusive due to right side weakness.  Lab results: Basic Metabolic Panel:  Basename 12/03/11 1504  Treniyah Lynn 138  K 3.7  CL 104  CO2 19  GLUCOSE 113*  BUN 14  CREATININE 1.25  CALCIUM 9.8  MG --  PHOS --   Liver Function Tests:  Basename 12/03/11 1504  AST 64*  ALT 38  ALKPHOS 77  BILITOT 0.5  PROT 8.3  ALBUMIN 3.7   CBC:  Basename 12/03/11 1504  WBC 6.3  NEUTROABS --  HGB 13.4  HCT 39.4  MCV 97.8  PLT 203   Coagulation:  Basename 12/03/11 1517  LABPROT 14.1  INR 1.07   Urine Drug Screen: Drugs of Abuse     Component Value Date/Time   LABOPIA NONE DETECTED 12/03/2011 1638   COCAINSCRNUR POSITIVE* 12/03/2011 1638   LABBENZ NONE DETECTED 12/03/2011 1638   AMPHETMU NONE DETECTED 12/03/2011 1638   THCU NONE DETECTED 12/03/2011 1638   LABBARB NONE DETECTED 12/03/2011 1638     Urinalysis:  Basename 12/03/11 1638  COLORURINE AMBER*  LABSPEC 1.023  PHURINE 6.0  GLUCOSEU NEGATIVE  HGBUR LARGE*  BILIRUBINUR SMALL*  KETONESUR NEGATIVE  PROTEINUR 100*  UROBILINOGEN 4.0*  NITRITE NEGATIVE  LEUKOCYTESUR NEGATIVE    Imaging results:  Ct Head Wo Contrast  12/03/2011  *RADIOLOGY REPORT*  Clinical Data: 2 days ago the patient woke up with right upper and lower extremity weakness along with difficulty speaking.  History of HIV.  History of diabetes.  History of hepatitis C.  CT HEAD WITHOUT CONTRAST  Technique:  Contiguous axial images were obtained from the base of the skull through the vertex without contrast.   Comparison: 04/16/2008.  Findings:  A linear hypodensity involving the left frontal cortex and subcortical white matter (image [id number], series 2)  is suspicious for acute infarction.No acute hemorrhage, mass lesion, hydrocephalus, or extra-axial fluid.  Mild atrophy.  Mild chronic microvascular ischemic change.  Calvarium intact.  No acute sinus or mastoid disease.  Compared with 2009, there is slight progression of atrophy.  The left frontal hypodensity is new.  In this patient with history of HIV, an asymmetric intra-axial hypodensity as is seen in this scan could represent an opportunistic infection or even tumor although felt to be less likely.  If the history is not suggestive of a sudden onset event typical of an infarct, consider MRI brain without and with contrast for further evaluation.  IMPRESSION: Linear hypodensity left frontal cortex and subcortical white matter without significant hemorrhage or mass effect is suspicious for acute infarction. See comments above. Correlate clinically for other potential processes which might appear similar.  Slight progression of atrophy from priors.  Original Report Authenticated By: Elsie Stain, M.D.    Other results: EKG: Sinus rhythm, left anterior fascicular block.  No old EKG to compare to.   Assessment & Plan by Problem:  # Acute left-sided frontal infarct    This is a 56 year old man with multiple risk factors for vascular diseases including diabetes, hypertension, hyperlipidemia and tobacco abuse who presented with 2 day history of gradual onset right-sided weakness and numbness along with slow speech.  His head CT in ED showed left sided frontal infarct.  He is not code stroke or TPA candidate.  -Admit to telemetry - NPO until cleared by speech eval - orthostatic vital signs - Neurochecks every 2 hours - Will initiate CVA order set, including carotid Doppler, 2-D echo, MRI and MRA. - check his hemoglobin A1c and lipid panel - PT and OT eval -  Will start Aspirin for secondary prevention      # Diabetes type 2 , last hemoglobin A1c 5.8 in 2011.  He is not on any medications - Recheck his hemoglobin A1c  # Hypertension He is noted  to have a blood pressure of 90s/60s in ED.  His home dose of lisinopril and hydrochlorothiazide are on hold to allow permissive hypertension.  # HIV medications, last CD4 410 and VL less than 20  He reports medical compliance with his HIV medications  -Will continue his ART treatments  # Substance abuse   He is a chronic cocaine abuser.  His UDS is positive for cocaine on this admission  -Social worker counseling  # Tobacco abuse Ongoing tobacco abuse for 40 years.  Patient is waiting to quit now.  -Nicotine patch -Smoking cessation counseling  #Depression, stable, continue home dose of her Seroquel and Remeron.  # VTE: Lovenox and   Signed: Trevaughn Schear 12/04/2011, 6:39 AM

## 2011-12-03 NOTE — ED Notes (Signed)
Admit Doctors at bedside. 

## 2011-12-03 NOTE — ED Notes (Signed)
Pt presents to department for evaluation of stroke like symptoms. States onset x2 days ago. States difficulty speaking and weakness to R side. Upon arrival pt having trouble articulating words, R sided grip weaker than L. No facial droop noted. Denies pain at the time. Spoke with Dr. Manus Gunning, did not call code stroke due to time frame. Pt is conscious alert and oriented x4.

## 2011-12-03 NOTE — ED Notes (Signed)
Doctor Ian Bushman Li at bedside ordered food tray regular patient passed swallow screen.

## 2011-12-04 ENCOUNTER — Inpatient Hospital Stay (HOSPITAL_COMMUNITY): Payer: Medicaid Other

## 2011-12-04 DIAGNOSIS — I6789 Other cerebrovascular disease: Secondary | ICD-10-CM

## 2011-12-04 DIAGNOSIS — I6350 Cerebral infarction due to unspecified occlusion or stenosis of unspecified cerebral artery: Principal | ICD-10-CM

## 2011-12-04 DIAGNOSIS — G459 Transient cerebral ischemic attack, unspecified: Secondary | ICD-10-CM

## 2011-12-04 LAB — BASIC METABOLIC PANEL
CO2: 18 mEq/L — ABNORMAL LOW (ref 19–32)
Calcium: 9.3 mg/dL (ref 8.4–10.5)
Creatinine, Ser: 1.04 mg/dL (ref 0.50–1.35)
GFR calc Af Amer: >90 mL/min (ref >90)
GFR calc non Af Amer: 80 mL/min — ABNORMAL LOW (ref >90)

## 2011-12-04 LAB — LIPID PANEL
Cholesterol: 122 mg/dL (ref 0–200)
HDL: 41 mg/dL (ref >39)
LDL Cholesterol: 55 mg/dL (ref 0–99)
Total CHOL/HDL Ratio: 3 RATIO
Triglycerides: 129 mg/dL (ref <150)
VLDL: 26 mg/dL (ref 0–40)

## 2011-12-04 LAB — TSH: TSH: 0.866 u[IU]/mL (ref 0.350–4.500)

## 2011-12-04 MED ORDER — LOPINAVIR-RITONAVIR 200-50 MG PO TABS
2.0000 | ORAL_TABLET | Freq: Two times a day (BID) | ORAL | Status: DC
  Administered 2011-12-04 – 2011-12-08 (×9): 2 via ORAL
  Filled 2011-12-04 (×11): qty 2

## 2011-12-04 MED ORDER — NICOTINE 21 MG/24HR TD PT24
21.0000 mg | MEDICATED_PATCH | TRANSDERMAL | Status: DC
  Administered 2011-12-04 – 2011-12-07 (×4): 21 mg via TRANSDERMAL
  Filled 2011-12-04 (×5): qty 1

## 2011-12-04 MED ORDER — LAMIVUDINE-ZIDOVUDINE 150-300 MG PO TABS
1.0000 | ORAL_TABLET | Freq: Two times a day (BID) | ORAL | Status: DC
  Administered 2011-12-04 – 2011-12-08 (×9): 1 via ORAL
  Filled 2011-12-04 (×10): qty 1

## 2011-12-04 MED ORDER — ASPIRIN 300 MG RE SUPP: 300.0000 mg | Freq: Every day | RECTAL | Status: DC

## 2011-12-04 MED ORDER — ONDANSETRON HCL 4 MG/2ML IJ SOLN: 4.0000 mg | Freq: Four times a day (QID) | INTRAMUSCULAR | Status: DC | PRN

## 2011-12-04 MED ORDER — ENOXAPARIN SODIUM 40 MG/0.4ML ~~LOC~~ SOLN
40.0000 mg | SUBCUTANEOUS | Status: DC
  Administered 2011-12-04 – 2011-12-07 (×4): 40 mg via SUBCUTANEOUS
  Filled 2011-12-04 (×5): qty 0.4

## 2011-12-04 MED ORDER — SENNOSIDES-DOCUSATE SODIUM 8.6-50 MG PO TABS: 1.0000 | ORAL_TABLET | Freq: Every evening | ORAL | Status: DC | PRN

## 2011-12-04 MED ORDER — MIRTAZAPINE 45 MG PO TABS
45.0000 mg | ORAL_TABLET | Freq: Every day | ORAL | Status: DC
  Administered 2011-12-04 – 2011-12-07 (×4): 45 mg via ORAL
  Filled 2011-12-04 (×5): qty 1

## 2011-12-04 MED ORDER — ASPIRIN EC 81 MG PO TBEC
81.0000 mg | DELAYED_RELEASE_TABLET | Freq: Every day | ORAL | Status: DC
  Administered 2011-12-04 – 2011-12-08 (×5): 81 mg via ORAL
  Filled 2011-12-04 (×5): qty 1

## 2011-12-04 MED ORDER — ZOLPIDEM TARTRATE 5 MG PO TABS
5.0000 mg | ORAL_TABLET | Freq: Every evening | ORAL | Status: DC | PRN
  Administered 2011-12-04 – 2011-12-07 (×2): 5 mg via ORAL
  Filled 2011-12-04 (×2): qty 1

## 2011-12-04 MED ORDER — ASPIRIN 325 MG PO TABS: 325.0000 mg | ORAL_TABLET | Freq: Every day | ORAL | Status: DC

## 2011-12-04 MED ORDER — SODIUM CHLORIDE 0.9 % IV SOLN
INTRAVENOUS | Status: DC
  Administered 2011-12-04: 05:00:00 via INTRAVENOUS

## 2011-12-04 MED ORDER — QUETIAPINE FUMARATE 300 MG PO TABS
300.0000 mg | ORAL_TABLET | Freq: Every day | ORAL | Status: DC
  Administered 2011-12-04 – 2011-12-07 (×4): 300 mg via ORAL
  Filled 2011-12-04 (×5): qty 1

## 2011-12-04 MED ORDER — TENOFOVIR DISOPROXIL FUMARATE 300 MG PO TABS
300.0000 mg | ORAL_TABLET | Freq: Every day | ORAL | Status: DC
  Administered 2011-12-04 – 2011-12-08 (×5): 300 mg via ORAL
  Filled 2011-12-04 (×5): qty 1

## 2011-12-04 NOTE — Evaluation (Signed)
Speech Language Pathology Evaluation Patient Details Name: Jacob Joseph MRN: [id number] DOB: May 23, 1956 Today's Date: 12/04/2011 Time: 4098-1191 SLP Time Calculation (min): 20 min  Problem List:  Patient Active Problem List  Diagnoses  . TB, PULMONARY NEC, UNSPECIFIED  . TUBERCULOSIS  . HIV INFECTION  . HEPATITIS C  . DIABETES MELLITUS, TYPE II  . ERECTILE DYSFUNCTION  . TOBACCO USER  . SUBSTANCE ABUSE  . INSOMNIA, CHRONIC  . DEPRESSION  . ESSENTIAL HYPERTENSION  . SKIN LESION  . DEGENERATIVE JOINT DISEASE  . DEGENERATIVE DISC DISEASE, LUMBAR SPINE  . LEG PAIN, LEFT  . BACTEREMIA  . LACERATION, SCALP  . Tinea cruris  . Inguinal hernia recurrent unilateral  . CVA (cerebral infarction)   Past Medical History:  Past Medical History  Diagnosis Date  . Hypertension   . HIV (human immunodeficiency virus infection)   . Depression    Past Surgical History: History reviewed. No pertinent past surgical history.  Assessment / Plan / Recommendation Clinical Impression  Pt presents with mild dysarthria of speech; no aphasia; cognition at baseline.  Pt would like to pursue SLP services to address clarity of speech - f/u venue to be determined after OT/PT evals.  Passed RN stroke swallow screen - no f/u for swallowing warranted.    SLP Assessment  Patient needs continued Speech Pathology Services    Follow Up Recommendations   (tba)    Frequency and Duration min 2x/week  1 week   Pertinent Vitals/Pain No pain   SLP Goals  SLP Goals Potential to Achieve Goals: Good Progress/Goals/Alternative treatment plan discussed with pt/caregiver and they: Agree SLP Goal #1: Pt will identify methods for improving speech intelligibility independently. SLP Goal #2: Pt will utilize strategies for speech clarity with min assist.  SLP Evaluation Prior Functioning  Cognitive/Linguistic Baseline: Within functional limits Type of Home: House (one story) Lives With: Spouse;Other  (Comment) (two grandchildren) Available Help at Discharge: Family (spouse does not work) Marine scientist: On disability   Cognition  Overall Cognitive Status: Appears within functional limits for tasks assessed Arousal/Alertness: Awake/alert Orientation Level: Oriented X4 Memory: Appears intact Comments: calm, asking appropriate questions    Comprehension  Auditory Comprehension Overall Auditory Comprehension: Appears within functional limits for tasks assessed Visual Recognition/Discrimination Discrimination: Within Function Limits Reading Comprehension Reading Status: Within funtional limits    Expression Expression Primary Mode of Expression: Verbal Verbal Expression Overall Verbal Expression: Appears within functional limits for tasks assessed   Oral / Motor Oral Motor/Sensory Function Overall Oral Motor/Sensory Function: Impaired (mild decrease cranial nerves VII and XII) Motor Speech Overall Motor Speech: Impaired Resonance: Within functional limits Articulation: Impaired Level of Impairment: Sentence Intelligibility: Intelligibility reduced Word: 75-100% accurate Phrase: 75-100% accurate Sentence: 75-100% accurate Conversation: 75-100% accurate Motor Planning: Witnin functional limits Effective Techniques: Over-articulate   Henry Utsey L. Samson Frederic, [place] CCC/SLP Pager [phone]   Blenda Mounts Laurice 12/04/2011, 11:39 AM

## 2011-12-04 NOTE — Progress Notes (Addendum)
Subjective: Patient still has right sided weakness which is unchanged.  He reports that he slurred speech is improving. No other c/o.  No acute event overnight.  Objective: Vital signs in last 24 hours: Filed Vitals:   12/04/11 0800 12/04/11 1200 12/04/11 1400 12/04/11 1600  BP: 112/77 123/84 118/86 119/83  Pulse: 82 76 90 77  Temp: 98.3 F (36.8 C) 98.1 F (36.7 C) 98.2 F (36.8 C) 98.1 F (36.7 C)  TempSrc: Oral Oral Oral Oral  Resp: 20 18 18 18   Height:      Weight:      SpO2: 98% 100% 98% 98%   Weight change:   Intake/Output Summary (Last 24 hours) at 12/04/11 1741 Last data filed at 12/04/11 1401  Gross per 24 hour  Intake 903.75 ml  Output      0 ml  Net 903.75 ml   General: NAD Neck: supple, full ROM, no thyromegaly, no JVD, and no carotid bruits.  Lungs: CTA B/L. no wheezes or rales. Heart: RRR, No M/G/R Abdomen: soft, BS x 4 Pulses: 2+ DP/PT pulses bilaterally Extremities: No edema Skin: turgor normal and no rashes.  Psych: Oriented X3, memory intact for recent and remote, normally interactive, good eye contact, not anxious appearing, and not depressed appearing.  Neurology:alert & oriented X3, cranial nerves II-VI, VIII intact. Expressive aphagia. Right side facial muscle weakness noted including weakness with raising eyebrow and closing right eye. Right facial droop noted. Right side sternocleidomastoid muscle and trapezius muscle weakness noted. His tongue deviate to right side. Right upper extremity muscle strength 2-3/5 with diminished reflexes. Right lower extremity strength 3/5 with hyper active reflexes. Babinskii positive. Right limbs decreased sensation noted. Right side cerebellar exams exam were inconclusive due to right side weakness.  Lab Results: Basic Metabolic Panel:  Lab 12/04/11 1610 12/03/11 1504  Kaiyah Eber 135 138  K 3.8 3.7  CL 102 104  CO2 18* 19  GLUCOSE 105* 113*  BUN 14 14  CREATININE 1.04 1.25  CALCIUM 9.3 9.8  MG -- --  PHOS -- --    Liver Function Tests:  Lab 12/03/11 1504 11/30/11 0928  AST 64* 56*  ALT 38 38  ALKPHOS 77 73  BILITOT 0.5 0.3  PROT 8.3 7.4  ALBUMIN 3.7 4.0   CBC:  Lab 12/03/11 1504 11/30/11 0928  WBC 6.3 7.3  NEUTROABS -- --  HGB 13.4 12.8*  HCT 39.4 39.5  MCV 97.8 100.5*  PLT 203 230   Hemoglobin A1C:  Lab 12/03/11 1504  HGBA1C 5.6   Fasting Lipid Panel:  Lab 12/04/11 0725  CHOL 122  HDL 41  LDLCALC 55  TRIG 129  CHOLHDL 3.0  LDLDIRECT --    Coagulation:  Lab 12/03/11 1517  LABPROT 14.1  INR 1.07   Urine Drug Screen: Drugs of Abuse     Component Value Date/Time   LABOPIA NONE DETECTED 12/03/2011 1638   COCAINSCRNUR POSITIVE* 12/03/2011 1638   LABBENZ NONE DETECTED 12/03/2011 1638   AMPHETMU NONE DETECTED 12/03/2011 1638   THCU NONE DETECTED 12/03/2011 1638   LABBARB NONE DETECTED 12/03/2011 1638    Urinalysis:  Lab 12/03/11 1638  COLORURINE AMBER*  LABSPEC 1.023  PHURINE 6.0  GLUCOSEU NEGATIVE  HGBUR LARGE*  BILIRUBINUR SMALL*  KETONESUR NEGATIVE  PROTEINUR 100*  UROBILINOGEN 4.0*  NITRITE NEGATIVE  LEUKOCYTESUR NEGATIVE   Micro Results: No results found for this or any previous visit (from the past 240 hour(s)). Studies/Results: Dg Chest 2 View  12/04/2011  *RADIOLOGY REPORT*  Clinical Data: Headache, stroke.  CHEST - 2 VIEW  Comparison: 04/16/2008  Findings: Heart and mediastinal contours are within normal limits. No focal opacities or effusions.  No acute bony abnormality.  IMPRESSION: No active cardiopulmonary disease.  Original Report Authenticated By: Cyndie Chime, M.D.   Ct Head Wo Contrast  12/03/2011  *RADIOLOGY REPORT*  Clinical Data: 2 days ago the patient woke up with right upper and lower extremity weakness along with difficulty speaking.  History of HIV.  History of diabetes.  History of hepatitis C.  CT HEAD WITHOUT CONTRAST  Technique:  Contiguous axial images were obtained from the base of the skull through the vertex without contrast.   Comparison: 04/16/2008.  Findings:  A linear hypodensity involving the left frontal cortex and subcortical white matter (image 20-21, series 2)  is suspicious for acute infarction.No acute hemorrhage, mass lesion, hydrocephalus, or extra-axial fluid.  Mild atrophy.  Mild chronic microvascular ischemic change.  Calvarium intact.  No acute sinus or mastoid disease.  Compared with 2009, there is slight progression of atrophy.  The left frontal hypodensity is new.  In this patient with history of HIV, an asymmetric intra-axial hypodensity as is seen in this scan could represent an opportunistic infection or even tumor although felt to be less likely.  If the history is not suggestive of a sudden onset event typical of an infarct, consider MRI brain without and with contrast for further evaluation.  IMPRESSION: Linear hypodensity left frontal cortex and subcortical white matter without significant hemorrhage or mass effect is suspicious for acute infarction. See comments above. Correlate clinically for other potential processes which might appear similar.  Slight progression of atrophy from priors.  Original Report Authenticated By: Elsie Stain, M.D.   Mr Brain Wo Contrast  12/04/2011  *RADIOLOGY REPORT*  Clinical Data:  Stroke.  Hypertension and HIV  MRI HEAD WITHOUT CONTRAST MRA HEAD WITHOUT CONTRAST  Technique:  Multiplanar, multiecho pulse sequences of the brain and surrounding structures were obtained without intravenous contrast. Angiographic images of the head were obtained using MRA technique without contrast.  Comparison:  CT 12/03/2011  MRI HEAD  Findings:  Acute infarct in the deep white matter in the left parietal lobe.  Small areas of acute infarct in the superior temporal lobe on the left.  Diffusion weighted imaging reveals  patchy areas of slight restricted diffusion in the deep white matter in the right frontal and parietal lobes which may be due to subacute infarcts in a watershed distribution.   Abnormal signal on the left middle cerebral artery suggesting occlusion.  Brainstem and cerebellum are intact.  Ventricle size is normal. Mild atrophy is present.  No mass lesion is evident.  IMPRESSION: Acute infarcts in the left parietal white matter and left superior temporal cortex.  Probable subacute infarcts in the deep white matter on the right in a watershed distribution.  MRA HEAD  Findings: Both vertebral arteries are patent to the basilar without stenosis.  Basilar,  superior cerebellar and posterior cerebral arteries are patent bilaterally.  Hypoplastic left P1 segment with fetal origin of the left posterior cerebral artery.  Left internal carotid artery ends in the posterior communicating artery which is patent.  The left middle cerebral artery and left A1 segment are not visualized.  Mild stenosis in the right cavernous carotid.  Right anterior and middle cerebral arteries are patent.  Left anterior cerebral artery is supplied from the right and is patent.  Negative for cerebral aneurysm.  IMPRESSION:  Occlusion  of the distal left internal carotid artery which ends after supplying the left posterior communicating artery.  Left M1 segment is occluded.  Poor flow or collateral flow in the left middle cerebral artery branches which are not well seen on the study.  Original Report Authenticated By: Camelia Phenes, M.D.   Mr Mra Head/brain Wo Cm  12/04/2011  *RADIOLOGY REPORT*  Clinical Data:  Stroke.  Hypertension and HIV  MRI HEAD WITHOUT CONTRAST MRA HEAD WITHOUT CONTRAST  Technique:  Multiplanar, multiecho pulse sequences of the brain and surrounding structures were obtained without intravenous contrast. Angiographic images of the head were obtained using MRA technique without contrast.  Comparison:  CT 12/03/2011  MRI HEAD  Findings:  Acute infarct in the deep white matter in the left parietal lobe.  Small areas of acute infarct in the superior temporal lobe on the left.  Diffusion weighted imaging  reveals  patchy areas of slight restricted diffusion in the deep white matter in the right frontal and parietal lobes which may be due to subacute infarcts in a watershed distribution.  Abnormal signal on the left middle cerebral artery suggesting occlusion.  Brainstem and cerebellum are intact.  Ventricle size is normal. Mild atrophy is present.  No mass lesion is evident.  IMPRESSION: Acute infarcts in the left parietal white matter and left superior temporal cortex.  Probable subacute infarcts in the deep white matter on the right in a watershed distribution.  MRA HEAD  Findings: Both vertebral arteries are patent to the basilar without stenosis.  Basilar,  superior cerebellar and posterior cerebral arteries are patent bilaterally.  Hypoplastic left P1 segment with fetal origin of the left posterior cerebral artery.  Left internal carotid artery ends in the posterior communicating artery which is patent.  The left middle cerebral artery and left A1 segment are not visualized.  Mild stenosis in the right cavernous carotid.  Right anterior and middle cerebral arteries are patent.  Left anterior cerebral artery is supplied from the right and is patent.  Negative for cerebral aneurysm.  IMPRESSION:  Occlusion of the distal left internal carotid artery which ends after supplying the left posterior communicating artery.  Left M1 segment is occluded.  Poor flow or collateral flow in the left middle cerebral artery branches which are not well seen on the study.  Original Report Authenticated By: Camelia Phenes, M.D.   Medications: I have reviewed the patient's current medications. Scheduled Meds:   . aspirin EC  81 mg Oral Daily  . enoxaparin  40 mg Subcutaneous Q24H  . lamiVUDine-zidovudine  1 tablet Oral BID  . lopinavir-ritonavir  2 tablet Oral BID  . mirtazapine  45 mg Oral QHS  . nicotine  21 mg Transdermal Q24H  . QUEtiapine  300 mg Oral QHS  . tenofovir  300 mg Oral Daily  . DISCONTD: aspirin  300 mg  Rectal Daily  . DISCONTD: aspirin  325 mg Oral Daily  . DISCONTD: hydrochlorothiazide  25 mg Oral Daily  . DISCONTD: lisinopril  20 mg Oral Daily   Continuous Infusions:   . DISCONTD: sodium chloride 75 mL/hr at 12/04/11 0510   PRN Meds:.ondansetron (ZOFRAN) IV, senna-docusate, zolpidem Assessment/Plan:  # Addendum    I have talked to Dr. Edilia Bo about patient's MRI/MRA results. We will also consult neurology radiologist in am for further advices on whether we should obtain Cranial arteriogram eval.   # Acute left-sided cerebral infarct.     - Admit to telemetry  - CL as  tol - Neurochecks every 4 hours  - Negative carotid Doppler and 2-D echo - MRI and MRA>>> multiple infarctions, Left LMA and ICA ?  occlusion.  - [facility] Dr. Edilia Bo called for consult. - PT and OT eval  -  Aspirin for secondary prevention   # Diabetes type 2  hemoglobin A1c 5.6 on admission.    Not on any medications  # Hypertension , SBP 120-130's He is noted to have a blood pressure of 90s/60s in ED. His home dose of lisinopril and hydrochlorothiazide are on hold to allow permissive hypertension.   # HIV medications, last CD4 410 and VL less than 20  He reports medical compliance with his HIV medications  -Will continue his ART treatments   # Substance abuse  He is a chronic cocaine abuser. His UDS is positive for cocaine on this admission  -Social worker counseling   # Tobacco abuse  Ongoing tobacco abuse for 40 years. Patient is waiting to quit now.  -Nicotine patch  -Smoking cessation counseling   #Depression, stable, continue home dose of her Seroquel and Remeron.   # VTE: Lovenox and      LOS: 1 day   Erubiel Manasco 12/04/2011, 5:41 PM

## 2011-12-04 NOTE — Progress Notes (Signed)
Occupational Therapy Evaluation Patient Details Name: Jacob Joseph MRN: [id number] DOB: 1956/06/23 Today's Date: 12/04/2011 Time: 0272-5366 OT Time Calculation (min): 29 min  OT Assessment / Plan / Recommendation Clinical Impression  Pt s/p L parietal lobe. Will benefit from acute OT to address below problem list in prep for d/c home with [facility] and 24/7 assist.    OT Assessment  Patient needs continued OT Services    Follow Up Recommendations  Home health OT;Supervision/Assistance - 24 hour    Barriers to Discharge      Equipment Recommendations  3 in 1 bedside comode    Recommendations for Other Services    Frequency  Min 3X/week    Precautions / Restrictions Precautions Precautions: Fall   Pertinent Vitals/Pain N/A    ADL  Eating/Feeding: Performed;Modified independent (with non dominant left hand) Where Assessed - Eating/Feeding: Edge of bed;Chair Lower Body Bathing: Simulated;Minimal assistance Where Assessed - Lower Body Bathing: Supported sit to stand Lower Body Dressing: Simulated;Minimal assistance Where Assessed - Lower Body Dressing: Unsupported sit to stand Toilet Transfer: Simulated;Minimal assistance Toilet Transfer Method:  (ambulating) Toilet Transfer Equipment: Other (comment) (chair) Transfers/Ambulation Related to ADLs: Pt ambulated to door with min assist for steadying.  Pt with slow shuffled steps. ADL Comments: Pt requiring increased time during ADLs due to decreased functional use of RUE.    OT Diagnosis: Generalized weakness;Paresis  OT Problem List: Decreased strength;Decreased activity tolerance;Impaired balance (sitting and/or standing);Decreased coordination;Decreased knowledge of use of DME or AE;Impaired sensation;Impaired UE functional use OT Treatment Interventions: Self-care/ADL training;Neuromuscular education;DME and/or AE instruction;Therapeutic activities;Patient/family education;Balance training;Cognitive remediation/compensation    OT Goals Acute Rehab OT Goals OT Goal Formulation: With patient Time For Goal Achievement: 12/11/11 Potential to Achieve Goals: Good ADL Goals Pt Will Perform Upper Body Dressing: Sitting, bed;with supervision ADL Goal: Upper Body Dressing - Progress: Goal set today Pt Will Perform Lower Body Dressing: with supervision;Sit to stand from chair;Sit to stand from bed ADL Goal: Lower Body Dressing - Progress: Goal set today Additional ADL Goal #1: Pt will perform 3/3 toileting tasks with supervison. ADL Goal: Additional Goal #1 - Progress: Goal set today Miscellaneous OT Goals Miscellaneous OT Goal #1: Pt will independently perform RUE HEP. OT Goal: Miscellaneous Goal #1 - Progress: Goal set today  Visit Information  Last OT Received On: 12/04/11 Assistance Needed: +1 PT/OT Co-Evaluation/Treatment: Yes    Subjective Data      Prior Functioning  Home Living Lives With: Spouse (82, 80 yo grandchildren) Available Help at Discharge: Family (spouse does not work) Type of Home: House Home Access: Stairs to enter Secretary/administrator of Steps: 3 Entrance Stairs-Rails: Right Home Layout: One level Bathroom Shower/Tub: Forensic scientist: Standard Bathroom Accessibility: Yes How Accessible: Accessible via walker Home Adaptive Equipment: None Prior Function Level of Independence: Independent Able to Take Stairs?: Reciprically Driving: Yes Vocation: On disability Communication Communication: Other (comment) (dysarthric) Dominant Hand: Right    Cognition  Overall Cognitive Status: Impaired Area of Impairment:  (slow processing) Arousal/Alertness: Awake/alert Orientation Level: Appears intact for tasks assessed Behavior During Session: [facility] for tasks performed    Extremity/Trunk Assessment Right Upper Extremity Assessment RUE ROM/Strength/Tone: Deficits RUE ROM/Strength/Tone Deficits: Elbow flexion/extension 3+/5 yet pt unable to bring utensil to mouth  for feeding.  Shoulder flexion and abduction  1/5.   Gross grasp 3/5. RUE Sensation: Deficits RUE Sensation Deficits: Pt reports that his hand feels numb. RUE Coordination: WFL - gross motor;Deficits RUE Coordination Deficits: Decreased fine motor coordination. Unable to manipulate fork  in hand. Left Upper Extremity Assessment LUE ROM/Strength/Tone: Within functional levels LUE Sensation: WFL - Light Touch;WFL - Proprioception LUE Coordination: WFL - gross/fine motor   Mobility Transfers Sit to Stand: 4: Min assist;With upper extremity assist;From bed Stand to Sit: 4: Min guard;To chair/3-in-1 Details for Transfer Assistance: pt slow to come to standing and needed assist to fully extend trunk; knee control good without buckling;    Exercise    Balance Balance Balance Assessed: Yes Dynamic Sitting Balance Dynamic Sitting - Balance Support: No upper extremity supported;Feet supported Dynamic Sitting - Level of Assistance: 5: Stand by assistance (for safety) Static Standing Balance Static Standing - Balance Support: Right upper extremity supported Static Standing - Level of Assistance: 4: Min assist Rhomberg - Eyes Opened: 30  Rhomberg - Eyes Closed: 20  (no unsteadiness)  End of Session OT - End of Session Equipment Utilized During Treatment: Gait belt Activity Tolerance: Patient tolerated treatment well Patient left: in chair;with call bell/phone within reach Nurse Communication: Mobility status  12/04/2011 Cipriano Mile OTR/L Pager [phone] Office [phone]  Cipriano Mile 12/04/2011, 5:54 PM

## 2011-12-04 NOTE — Progress Notes (Signed)
PT/OT Cancellation Note  Treatment cancelled today due to patient receiving procedure (MRI).  Also noted that pt with bedrest orders. Will re-attempt later today when pt returns and pending increased activity orders.  Thanks!  12/04/2011 Cipriano Mile OTR/L Pager [phone] Office [phone]

## 2011-12-04 NOTE — Progress Notes (Signed)
*  PRELIMINARY RESULTS* Vascular Ultrasound Carotid Duplex (Doppler) has been completed.  Preliminary findings: Bilateral no significant ICA stenosis.  Farrel Demark, RDMS 12/04/2011, 9:45 AM

## 2011-12-04 NOTE — Progress Notes (Signed)
  Echocardiogram 2D Echocardiogram has been performed.  Jacob Joseph L 12/04/2011, 10:15 AM

## 2011-12-04 NOTE — Progress Notes (Signed)
  12/04/2011 Ayeden Gladman, PT Pager: [phone]   

## 2011-12-04 NOTE — H&P (Signed)
Internal Medicine Teaching Service Attending Note Date: 12/04/2011  Patient name: Jacob Joseph  Medical record number: [id number]  Date of birth: [birth year]   I have seen and evaluated Jacob Joseph and discussed their care with the Residency Team. Please see Dr Stevenson Clinch H&P for full details. I agree with the formulated Assessment and Plan with the following changes:   Jacob Joseph developed R UE and LE weakness along with difficulty speaking that his wife noticed two days ago. Pt did not present to the ER at that time. When sxs persisted, he presented to the ER. Ct showed hypodensity in the left frontal cortex and subcortical white matter that was c/w a CVA. MRI has since confirmed this, showing acute infarcts in the L parietal white matter and L superior temp cortex, and subacute infarcts in the deep white matter on the right. Carotids were negative, TTE showed grade 1 diastolic dysfxn, and LDL was 55. Pt was not on any anti-plt agent and is now on ASA 81. I was able to observe him walk and his did quite well, even with IV pole, with minimal to no assist. He was able to clear his L toe off the ground and did not appear to have a sig R foot drop. He has noticed an improvement in his R LE but not so much his R UE. Per pt, PT rec home PT but formal note from PT pending. Will need to work with social work as pt has no insurance to cover PT.  Cocaine use - Pt states uses occasionally and will not need help to quit completely.   HTN - on HCTZ lisinopril at home but were held 2/2 hypotension to allow permissive HTN. Follow BP closely.  HIV - well controlled per Dr Orvan Falconer. Pt states no trouble taking anti-virals.   Depression - continue home meds.  Tobacco use - pt ready to quite. Nicotine patch.

## 2011-12-04 NOTE — Progress Notes (Signed)
Utilization review completed. Temiloluwa Recchia, RN, BSN.  12/04/11   

## 2011-12-05 DIAGNOSIS — I6340 Cerebral infarction due to embolism of unspecified cerebral artery: Secondary | ICD-10-CM

## 2011-12-05 MED ORDER — SIMVASTATIN 20 MG PO TABS
20.0000 mg | ORAL_TABLET | Freq: Every day | ORAL | Status: DC
  Filled 2011-12-05: qty 1

## 2011-12-05 NOTE — Progress Notes (Signed)
Subjective: Patient still has right sided weakness which is unchanged.  Speech is improved Is able to eat properly. Has no nausea, vomiting. Was inquiring when he can go home. Wife at bedside.  Objective: Vital signs in last 24 hours: Filed Vitals:   12/04/11 2014 12/05/11 0142 12/05/11 0543 12/05/11 1000  BP: 124/77 121/81 113/77 121/81  Pulse: 75 92 82 86  Temp: 99.1 F (37.3 C) 98.5 F (36.9 C) 98.5 F (36.9 C) 97.8 F (36.6 C)  TempSrc: Oral Oral Oral Oral  Resp: 16 20 18 16   Height:      Weight:      SpO2: 98% 98% 97% 97%   Weight change:   Intake/Output Summary (Last 24 hours) at 12/05/11 1045 Last data filed at 12/04/11 2238  Gross per 24 hour  Intake 1727.5 ml  Output    400 ml  Net 1327.5 ml   General: NAD Neck: supple, full ROM, no thyromegaly, no JVD, and no carotid bruits.  Lungs: CTA B/L. no wheezes or rales. Heart: RRR, No M/G/R Abdomen: soft, BS x 4 Pulses: 2+ DP/PT pulses bilaterally Extremities: No edema Skin: turgor normal and no rashes.  Psych: Oriented X3, memory intact for recent and remote, normally interactive, good eye contact, not anxious appearing, and not depressed appearing.  Neurology:alert & oriented X3, cranial nerves II-VI, VIII intact. Expressive aphagia. Right side facial muscle weakness noted including weakness with raising eyebrow and closing right eye. Right facial droop noted. Right side sternocleidomastoid muscle and trapezius muscle weakness noted. His tongue deviate to right side. Right upper extremity muscle strength 2-3/5 with diminished reflexes. Right lower extremity strength 3/5 with hyper active reflexes. Babinskii positive. Right limbs decreased sensation noted. Right side cerebellar exams exam were inconclusive due to right side weakness.  Lab Results: Basic Metabolic Panel:  Lab 12/04/11 1610 12/03/11 1504  NA 135 138  K 3.8 3.7  CL 102 104  CO2 18* 19  GLUCOSE 105* 113*  BUN 14 14  CREATININE 1.04 1.25  CALCIUM  9.3 9.8  MG -- --  PHOS -- --   Liver Function Tests:  Lab 12/03/11 1504 11/30/11 0928  AST 64* 56*  ALT 38 38  ALKPHOS 77 73  BILITOT 0.5 0.3  PROT 8.3 7.4  ALBUMIN 3.7 4.0   CBC:  Lab 12/03/11 1504 11/30/11 0928  WBC 6.3 7.3  NEUTROABS -- --  HGB 13.4 12.8*  HCT 39.4 39.5  MCV 97.8 100.5*  PLT 203 230   Hemoglobin A1C:  Lab 12/03/11 1504  HGBA1C 5.6   Fasting Lipid Panel:  Lab 12/04/11 0725  CHOL 122  HDL 41  LDLCALC 55  TRIG 129  CHOLHDL 3.0  LDLDIRECT --    Coagulation:  Lab 12/03/11 1517  LABPROT 14.1  INR 1.07   Urine Drug Screen: Drugs of Abuse     Component Value Date/Time   LABOPIA NONE DETECTED 12/03/2011 1638   COCAINSCRNUR POSITIVE* 12/03/2011 1638   LABBENZ NONE DETECTED 12/03/2011 1638   AMPHETMU NONE DETECTED 12/03/2011 1638   THCU NONE DETECTED 12/03/2011 1638   LABBARB NONE DETECTED 12/03/2011 1638    Urinalysis:  Lab 12/03/11 1638  COLORURINE AMBER*  LABSPEC 1.023  PHURINE 6.0  GLUCOSEU NEGATIVE  HGBUR LARGE*  BILIRUBINUR SMALL*  KETONESUR NEGATIVE  PROTEINUR 100*  UROBILINOGEN 4.0*  NITRITE NEGATIVE  LEUKOCYTESUR NEGATIVE   Studies/Results: Dg Chest 2 View  12/04/2011  *RADIOLOGY REPORT*  Clinical Data: Headache, stroke.  CHEST - 2 VIEW  Comparison: 04/16/2008  Findings: Heart and mediastinal contours are within normal limits. No focal opacities or effusions.  No acute bony abnormality.  IMPRESSION: No active cardiopulmonary disease.  Original Report Authenticated By: Cyndie Chime, M.D.   Ct Head Wo Contrast  12/03/2011  *RADIOLOGY REPORT*  Clinical Data: 2 days ago the patient woke up with right upper and lower extremity weakness along with difficulty speaking.  History of HIV.  History of diabetes.  History of hepatitis C.  CT HEAD WITHOUT CONTRAST  Technique:  Contiguous axial images were obtained from the base of the skull through the vertex without contrast.  Comparison: 04/16/2008.  Findings:  A linear hypodensity  involving the left frontal cortex and subcortical white matter (image 20-21, series 2)  is suspicious for acute infarction.No acute hemorrhage, mass lesion, hydrocephalus, or extra-axial fluid.  Mild atrophy.  Mild chronic microvascular ischemic change.  Calvarium intact.  No acute sinus or mastoid disease.  Compared with 2009, there is slight progression of atrophy.  The left frontal hypodensity is new.  In this patient with history of HIV, an asymmetric intra-axial hypodensity as is seen in this scan could represent an opportunistic infection or even tumor although felt to be less likely.  If the history is not suggestive of a sudden onset event typical of an infarct, consider MRI brain without and with contrast for further evaluation.  IMPRESSION: Linear hypodensity left frontal cortex and subcortical white matter without significant hemorrhage or mass effect is suspicious for acute infarction. See comments above. Correlate clinically for other potential processes which might appear similar.  Slight progression of atrophy from priors.  Original Report Authenticated By: Elsie Stain, M.D.   Mr Brain Wo Contrast  12/04/2011  *RADIOLOGY REPORT*  Clinical Data:  Stroke.  Hypertension and HIV  MRI HEAD WITHOUT CONTRAST MRA HEAD WITHOUT CONTRAST  Technique:  Multiplanar, multiecho pulse sequences of the brain and surrounding structures were obtained without intravenous contrast. Angiographic images of the head were obtained using MRA technique without contrast.  Comparison:  CT 12/03/2011  MRI HEAD  Findings:  Acute infarct in the deep white matter in the left parietal lobe.  Small areas of acute infarct in the superior temporal lobe on the left.  Diffusion weighted imaging reveals  patchy areas of slight restricted diffusion in the deep white matter in the right frontal and parietal lobes which may be due to subacute infarcts in a watershed distribution.  Abnormal signal on the left middle cerebral artery  suggesting occlusion.  Brainstem and cerebellum are intact.  Ventricle size is normal. Mild atrophy is present.  No mass lesion is evident.  IMPRESSION: Acute infarcts in the left parietal white matter and left superior temporal cortex.  Probable subacute infarcts in the deep white matter on the right in a watershed distribution.  MRA HEAD  Findings: Both vertebral arteries are patent to the basilar without stenosis.  Basilar,  superior cerebellar and posterior cerebral arteries are patent bilaterally.  Hypoplastic left P1 segment with fetal origin of the left posterior cerebral artery.  Left internal carotid artery ends in the posterior communicating artery which is patent.  The left middle cerebral artery and left A1 segment are not visualized.  Mild stenosis in the right cavernous carotid.  Right anterior and middle cerebral arteries are patent.  Left anterior cerebral artery is supplied from the right and is patent.  Negative for cerebral aneurysm.  IMPRESSION:  Occlusion of the distal left internal carotid artery which ends after supplying the left  posterior communicating artery.  Left M1 segment is occluded.  Poor flow or collateral flow in the left middle cerebral artery branches which are not well seen on the study.  Original Report Authenticated By: Camelia Phenes, M.D.   Mr Mra Head/brain Wo Cm  12/04/2011  *RADIOLOGY REPORT*  Clinical Data:  Stroke.  Hypertension and HIV  MRI HEAD WITHOUT CONTRAST MRA HEAD WITHOUT CONTRAST  Technique:  Multiplanar, multiecho pulse sequences of the brain and surrounding structures were obtained without intravenous contrast. Angiographic images of the head were obtained using MRA technique without contrast.  Comparison:  CT 12/03/2011  MRI HEAD  Findings:  Acute infarct in the deep white matter in the left parietal lobe.  Small areas of acute infarct in the superior temporal lobe on the left.  Diffusion weighted imaging reveals  patchy areas of slight restricted  diffusion in the deep white matter in the right frontal and parietal lobes which may be due to subacute infarcts in a watershed distribution.  Abnormal signal on the left middle cerebral artery suggesting occlusion.  Brainstem and cerebellum are intact.  Ventricle size is normal. Mild atrophy is present.  No mass lesion is evident.  IMPRESSION: Acute infarcts in the left parietal white matter and left superior temporal cortex.  Probable subacute infarcts in the deep white matter on the right in a watershed distribution.  MRA HEAD  Findings: Both vertebral arteries are patent to the basilar without stenosis.  Basilar,  superior cerebellar and posterior cerebral arteries are patent bilaterally.  Hypoplastic left P1 segment with fetal origin of the left posterior cerebral artery.  Left internal carotid artery ends in the posterior communicating artery which is patent.  The left middle cerebral artery and left A1 segment are not visualized.  Mild stenosis in the right cavernous carotid.  Right anterior and middle cerebral arteries are patent.  Left anterior cerebral artery is supplied from the right and is patent.  Negative for cerebral aneurysm.  IMPRESSION:  Occlusion of the distal left internal carotid artery which ends after supplying the left posterior communicating artery.  Left M1 segment is occluded.  Poor flow or collateral flow in the left middle cerebral artery branches which are not well seen on the study.  Original Report Authenticated By: Camelia Phenes, M.D.   Medications: I have reviewed the patient's current medications. Scheduled Meds:    . aspirin EC  81 mg Oral Daily  . enoxaparin  40 mg Subcutaneous Q24H  . lamiVUDine-zidovudine  1 tablet Oral BID  . lopinavir-ritonavir  2 tablet Oral BID  . mirtazapine  45 mg Oral QHS  . nicotine  21 mg Transdermal Q24H  . QUEtiapine  300 mg Oral QHS  . tenofovir  300 mg Oral Daily   Continuous Infusions:    . DISCONTD: sodium chloride 75 mL/hr at  12/04/11 0510   PRN Meds:.ondansetron (ZOFRAN) IV, senna-docusate, zolpidem Assessment/Plan:    Talked to Dr. Edilia Bo about patient's MRI/MRA results, who recommended consult neurology and possibly interventional radiology radiologist further advices on whether we should obtain Cranial arteriogram eval.   # Acute left-sided cerebral infarct.  - CL as tol - Neurochecks every 4 hours  - Negative carotid Doppler and 2-D echo - MRI and MRA>>> multiple infarctions, Left LMA and ICA ?  occlusion.  - [facility] Dr. Edilia Bo called for consult. -Neuro consult placed- to see patient today. We'll await further recommendations before calling IR. - PT and OT eval  -  Aspirin for secondary  prevention  - Will add statin per Neuro recs.  # Diabetes type 2  hemoglobin A1c 5.6 on admission.    Not on any medications  # Hypertension , SBP 110-130's He is noted to have a blood pressure of 90s/60s in ED. His home dose of lisinopril and hydrochlorothiazide are on hold to allow permissive hypertension.   # HIV medications, last CD4 410 and VL less than 20  He reports medical compliance with his HIV medications  -Will continue his ART treatments   # Substance abuse  He is a chronic cocaine abuser. His UDS is positive for cocaine on this admission  -Social worker counseling   # Tobacco abuse  Ongoing tobacco abuse for 40 years. Patient is waiting to quit now.  -Nicotine patch  -Smoking cessation counseling   #Depression, stable, continue home dose of her Seroquel and Remeron.   # VTE: Lovenox and      LOS: 2 days   Renny Gunnarson 12/05/2011, 10:45 AM

## 2011-12-05 NOTE — Consult Note (Signed)
Referring Physician: Dr. Rogelia Boga    Chief Complaint: CVA  HPI: Theodore Virgin is an 56 y.o. male admitted to [facility] on 12/03/11 after two days of right arm and leg weakness, as well as difficulty speaking. Patient recalls feeling long periods of heart racing and dizziness x 1 week leading up to presenting symptoms. He felt his equilibrium was off and had a couple falls.  Since admission, he has had some improvement in strength and ability to ambulate with right leg, but not much improvement right upper extremity.  He has not had any awareness of palpitations since admit.    Head CT in ER 12/04/11  revealed hypodensity in the left frontal cortex and subcortical white matter  c/w CVA.  MRI brain revealed acute infarcts in the left parietal white matter and left superior temporal cortex.  Probable subacute infarcts in the deep white matter on the right in a watershed distribution. MRA Brain showed occlusion of the distal left internal carotid artery which ends after supplying the left posterior communicating artery. Left M1 segment is occluded.  Poor flow or collateral flow in the left middle cerebral artery branches which are not well seen on the study.   Pt was not on any anti-plt agent prior to admission and was started on ASA 81.  Admits to cocaine use and is willing to quit.   Neurology consult has been requested.  LSN:~ 11/30/11 tPA Given: no; presented outside tPA window. MRankin: 4 on admission    Past Medical History  Diagnosis Date  . Hypertension   . HIV (human immunodeficiency virus infection)   . Depression     History reviewed. No pertinent past surgical history.  Family History  Problem Relation Age of Onset  . Hypertension Mother   . Hypertension Father    Social History: Smokes cigarettes; 20.5 pack-year smoking history. No smokeless tobacco. Drinks alcohol. Uses illicit drugs ("Crack" cocaine and Cocaine).  Allergies: No Known Allergies  Medications: Scheduled:   . aspirin  EC  81 mg Oral Daily  . enoxaparin  40 mg Subcutaneous Q24H  . lamiVUDine-zidovudine  1 tablet Oral BID  . lopinavir-ritonavir  2 tablet Oral BID  . mirtazapine  45 mg Oral QHS  . nicotine  21 mg Transdermal Q24H  . QUEtiapine  300 mg Oral QHS  . simvastatin  20 mg Oral q1800  . tenofovir  300 mg Oral Daily    ROS: no recent fever, chills or URI sx. Some difficulty speaking and swallowing prior to admission, no sensation of choking or early satiety.  Denies N/V/C.  Had a few days of diarrhea 1 week ago.  No headache, visual disturbance or syncope.  No chest pain or shortness of breath.  Physical Examination: Blood pressure 147/88, pulse 81, temperature 98.3 F (36.8 C), temperature source Oral, resp. rate 17, height 6' (1.829 m), weight 81 kg (178 lb 9.2 oz), SpO2 99.00%.  BP at bedside 125/84 right UE, 80 pulse during exam.  Neurologic Examination: Mental Status: Somewhat drowsy.  Awakens to verbal stimulation. Attention is fair. Speech is fluent, but slowed with mild slurring.  Oriented to name, date and place. Repitition and naming intact. Follows all commands.  Cranial Nerves: II- Visual fields grossly intact. III/IV/VI-limited gaze to right without nystagmus.  Pupils reactive bilaterally. V/VII-mild right facial droop, sensation intact. VIII-grossly intact X-phonation intact.  XII-slight deviation to right. Motor: 4/5 R shoulder and R triceps, 4-/5 R biceps/RLE. 5/5 LUE/LLE Sensory: Light touch intact without extinction in legs.  Decreased temp sensation right arm and leg without extinction.     Deep Tendon Reflexes: 2+ and symmetric throughout except for crossed adductor reflex when eliciting right patellar, nl left patellar, 1+ ankles bilaterally.   Cerebellar: Normal finger-to-nose on left; slow without ataxia on the right.    Basic Metabolic Panel:  Lab 12/04/11 1610 12/03/11 1504  NA 135 138  K 3.8 3.7  CL 102 104  CO2 18* 19  GLUCOSE 105* 113*  BUN 14 14    CREATININE 1.04 1.25  CALCIUM 9.3 9.8  MG -- --  PHOS -- --   Liver Function Tests:  Lab 12/03/11 1504 11/30/11 0928  AST 64* 56*  ALT 38 38  ALKPHOS 77 73  BILITOT 0.5 0.3  PROT 8.3 7.4  ALBUMIN 3.7 4.0   CBC:  Lab 12/03/11 1504 11/30/11 0928  WBC 6.3 7.3  NEUTROABS -- --  HGB 13.4 12.8*  HCT 39.4 39.5  MCV 97.8 100.5*  PLT 203 230   Hemoglobin A1C:  Lab 12/03/11 1504  HGBA1C 5.6   Fasting Lipid Panel:  Lab 12/04/11 0725  CHOL 122  HDL 41  LDLCALC 55  TRIG 129  CHOLHDL 3.0  LDLDIRECT --   Thyroid Function Tests:  Lab 12/04/11 1627  TSH 0.866  T4TOTAL --  FREET4 --  T3FREE --  THYROIDAB --   Coagulation:  Lab 12/03/11 1517  LABPROT 14.1  INR 1.07   Urine Drug Screen: Drugs of Abuse     Component Value Date/Time   LABOPIA NONE DETECTED 12/03/2011 1638   COCAINSCRNUR POSITIVE* 12/03/2011 1638   LABBENZ NONE DETECTED 12/03/2011 1638   AMPHETMU NONE DETECTED 12/03/2011 1638   THCU NONE DETECTED 12/03/2011 1638   LABBARB NONE DETECTED 12/03/2011 1638    Urinalysis:  Lab 12/03/11 1638  COLORURINE AMBER*  LABSPEC 1.023  PHURINE 6.0  GLUCOSEU NEGATIVE  HGBUR LARGE*  BILIRUBINUR SMALL*  KETONESUR NEGATIVE  PROTEINUR 100*  UROBILINOGEN 4.0*  NITRITE NEGATIVE  LEUKOCYTESUR NEGATIVE    12/04/2011 CHEST - 2 VIEW Findings: Heart and mediastinal contours are within normal limits. No focal opacities or effusions.  No acute bony abnormality.  IMPRESSION: No active cardiopulmonary disease. Cyndie Chime, M.D.  12/03/2011  CT HEAD WITHOUT CONTRAST   Findings:  A linear hypodensity involving the left frontal cortex and subcortical white matter (image 20-21, series 2)  is suspicious for acute infarction.No acute hemorrhage, mass lesion, hydrocephalus, or extra-axial fluid.  Mild atrophy.  Mild chronic microvascular ischemic change.  Calvarium intact.  No acute sinus or mastoid disease.  Compared with 2009, there is slight progression of atrophy.  The left  frontal hypodensity is new.  In this patient with history of HIV, an asymmetric intra-axial hypodensity as is seen in this scan could represent an opportunistic infection or even tumor although felt to be less likely.  If the history is not suggestive of a sudden onset event typical of an infarct, consider MRI brain without and with contrast for further evaluation.  IMPRESSION: Linear hypodensity left frontal cortex and subcortical white matter without significant hemorrhage or mass effect is suspicious for acute infarction. See comments above. Correlate clinically for other potential processes which might appear similar.  Slight progression of atrophy from priors.  Elsie Stain, M.D.   12/04/2011  MRI HEAD WITHOUT CONTRAST  Findings:  Acute infarct in the deep white matter in the left parietal lobe.  Small areas of acute infarct in the superior temporal lobe on the left.  Diffusion weighted imaging reveals  patchy areas of slight restricted diffusion in the deep white matter in the right frontal and parietal lobes which may be due to subacute infarcts in a watershed distribution.  Abnormal signal on the left middle cerebral artery suggesting occlusion.  Brainstem and cerebellum are intact.  Ventricle size is normal. Mild atrophy is present.  No mass lesion is evident.  IMPRESSION: Acute infarcts in the left parietal white matter and left superior temporal cortex.  Probable subacute infarcts in the deep white matter on the right in a watershed distribution.  Camelia Phenes, M.D.   12/04/2011 MRA HEAD WITHOUT CONTRAST Findings: Both vertebral arteries are patent to the basilar without stenosis.  Basilar,  superior cerebellar and posterior cerebral arteries are patent bilaterally.  Hypoplastic left P1 segment with fetal origin of the left posterior cerebral artery.  Left internal carotid artery ends in the posterior communicating artery which is patent.  The left middle cerebral artery and left A1 segment are not  visualized.  Mild stenosis in the right cavernous carotid.  Right anterior and middle cerebral arteries are patent.  Left anterior cerebral artery is supplied from the right and is patent.  Negative for cerebral aneurysm.  IMPRESSION:  Occlusion of the distal left internal carotid artery which ends after supplying the left posterior communicating artery.  Left M1 segment is occluded.  Poor flow or collateral flow in the left middle cerebral artery branches which are not well seen on the study.   Camelia Phenes, M.D.   12/04/11 2D ECHO Left ventricle: The cavity size was normal. Wall thickness was normal. Systolic function was normal. The estimated ejection fraction was in the range of 60% to 65%. Doppler parameters are consistent with abnormal left ventricular relaxation (grade 1 diastolic dysfunction). - Mitral valve: Mild regurgitation. - Pulmonary arteries: PA peak pressure: 37mm Hg (S).  12/04/11 Carotid Dopplers No significant extracranial carotid artery stenosis demonstrated. Vertebrals are patent with antegrade flow.  Passed stroke swallow screen.  Assessment: 55 y.o. male with onset weakness and dysarthria 2 days prior to admission 12/04/11 for persistent sx.  Head CT and MR studies have confirmed acute infarcts in the left parietal white matter and left superior temporal cortex and probable subacute infarcts in the deep white matter on the right in a watershed distribution. MRA brain showed occlusion of the distal left internal carotid artery which ends after supplying the left posterior communicating artery. Left M1 segment is occluded.  Poor flow or collateral flow in the left middle cerebral artery branches which are not well seen on the study.  Suspect embolic and ischemic phenomenon involved. Possible causes for the left ICA occlusion include carotid dissection (less likely due to no complaint of neck pain and no Horner's syndrome), cardioembolism and vasospasm secondary to cocaine use  (positive drug screen this admission).  Patient reports tachy-palpitations 1 week prior to admission, but not since admit.  Telemetry reveals SR.  Admit ECG revealed SR.  LDL 55 A1C 5.6 HIV ( last CD4 410 and VL < 20 on 11/30/11)   Stroke Risk Factors - DM, HTN, tobacco use, HIV  Plan: 1. Agree with aspirin 81 mg for stroke prophylaxis. 2. ST/OT/PT  3. Polysubstance abuse counseling 4. Smoking cessation consult; would not recommend nicotine patch for comfort, as risk of vasospasm/ stroke extension greater. 5. Risk factor modification - Start Lipitor. This should be started regardless of the LDL <100, based upon the literature (Rothwell's study performed in [place] on a  large cohort).  6. Telemetry monitoring 7. Recommend neurosurgery consult to evaluate carotid and M1 occlusion/ revascularization options. 8. Agree with holding antihypertensives and allowing for permissive HTN; Goal SBP 130-150. 9. When hemodynamically stable, will require TEE to further investigate possible cardioembolic sources. 10. Hypercoagulability lab panel.   Patient seen and examined by Dr. Otelia Limes who recommends above. Marya Fossa PA-C [facility] [phone] 12/05/2011, 3:05 PM  Electronically signed: Dr. Caryl Pina

## 2011-12-05 NOTE — Progress Notes (Signed)
12/04/11 1339  PT Visit Information  Last PT Received On 12/04/11  Assistance Needed +1  PT/OT Co-Evaluation/Treatment Yes  PT Time Calculation  PT Start Time 1337  PT Stop Time 1403  PT Time Calculation (min) 26 min  Subjective Data  Subjective Pt reports he keeps trying to use his Rt hand  Patient Stated Goal Pt wants to get Rt hand working  Precautions  Precautions Fall  Home Living  Lives With Spouse (14, 55 yo grandchildren)  Available Help at Discharge Family (spouse does not work)  Type of Camera operator Stairs to enter  Entrance Stairs-Number of Steps 3  Entrance Stairs-Rails Right  Home Layout One level  Bathroom Shower/Tub Tub/shower unit;Curtain  Horticulturist, commercial Yes  How Accessible Accessible via walker  Home Adaptive Equipment None  Prior Function  Level of Independence Independent  Able to Take Stairs? Reciprically  Driving Yes  Vocation On disability  Communication  Communication Other (comment) (dysarthric)  Cognition  Overall Cognitive Status Impaired  Area of Impairment (slow processing)  Arousal/Alertness Awake/alert  Behavior During Session [facility] for tasks performed  Right Lower Extremity Assessment  RLE ROM/Strength/Tone Deficits  RLE ROM/Strength/Tone Deficits full AROM; knee extension 3+/5; dorsiflexion 4/5  RLE Sensation Deficits  RLE Sensation Deficits reports foot is numb; could not feel his foot on the floor  RLE Coordination WFL - gross motor  Left Lower Extremity Assessment  LLE ROM/Strength/Tone WFL for tasks assessed  LLE Coordination WFL - gross motor  Transfers  Transfers Sit to Stand;Stand to Sit  Sit to Stand 4: Min assist;With upper extremity assist;From bed  Stand to Sit 4: Min guard;To chair/3-in-1  Details for Transfer Assistance pt slow to come to standing and needed assist to fully extend trunk; knee control good without buckling;   Ambulation/Gait  Ambulation/Gait Assistance 4:  Min assist  Ambulation Distance (Feet) 30 Feet  Assistive device 1 person hand held assist  Ambulation/Gait Assistance Details Pt with very short stride length--partly due to small, narrow spaces in his room, and due to inability to feel his foot on the floor  Gait Pattern Step-to pattern;Decreased stride length;Right foot flat;Left foot flat  Modified Rankin (Stroke Patients Only)  Pre-Morbid Rankin Score 0  Modified Rankin 4  Balance  Balance Assessed Yes  Dynamic Sitting Balance  Dynamic Sitting - Balance Support No upper extremity supported;Feet supported  Dynamic Sitting - Level of Assistance 5: Stand by assistance (for safety)  Static Standing Balance  Static Standing - Balance Support Right upper extremity supported  Static Standing - Level of Assistance 4: Min assist  Rhomberg - Eyes Opened 30   Rhomberg - Eyes Closed 20  (no unsteadiness)  PT - End of Session  Equipment Utilized During Treatment Gait belt  Activity Tolerance Patient tolerated treatment well  Patient left in chair  Nurse Communication Mobility status  PT Assessment  Clinical Impression Statement Pt adm with Rt sided weakness and speech changes; head CT + Lt frontal infarct with resultant problems listed below.  Pt will benefit from continued PT to address mobility deficits.  PT Recommendation/Assessment Patient needs continued PT services  PT Problem List Decreased strength;Decreased balance;Decreased mobility;Decreased knowledge of use of DME;Impaired sensation  Barriers to Discharge None  PT Therapy Diagnosis  Difficulty walking;Hemiplegia dominant side  PT Plan  PT Frequency Min 4X/week  PT Treatment/Interventions DME instruction  PT Recommendation  Follow Up Recommendations Home health PT;Supervision - Intermittent  Equipment Recommended None recommended by  PT  Individuals Consulted  Consulted and Agree with Results and Recommendations Patient  Acute Rehab PT Goals  PT Goal Formulation With patient   Time For Goal Achievement 12/11/11  Potential to Achieve Goals Good  Pt will go Supine/Side to Sit Independently  PT Goal: Supine/Side to Sit - Progress Goal set today  Pt will go Sit to Stand with supervision;without upper extremity assist  PT Goal: Sit to Stand - Progress Goal set today  Pt will Ambulate >150 feet;Independently  PT Goal: Ambulate - Progress Goal set today  Pt will Go Up / Down Stairs 3-5 stairs;with rail(s)  PT Goal: Up/Down Stairs - Progress Goal set today  Additional Goals  Additional Goal #1 Pt will complete Berg Balance Assessment vs DGI to assess fall risk.  PT Goal: Additional Goal #1 - Progress Goal set today  Written Expression  Dominant Hand Right    Note created by Wandra Arthurs, PT. Evaluation completed by Veda Canning, PT

## 2011-12-06 DIAGNOSIS — I6340 Cerebral infarction due to embolism of unspecified cerebral artery: Secondary | ICD-10-CM

## 2011-12-06 LAB — URINALYSIS, ROUTINE W REFLEX MICROSCOPIC
Glucose, UA: NEGATIVE mg/dL
Ketones, ur: 15 mg/dL — AB
Protein, ur: 30 mg/dL — AB
pH: 6.5 (ref 5.0–8.0)

## 2011-12-06 LAB — URINE MICROSCOPIC-ADD ON

## 2011-12-06 LAB — BASIC METABOLIC PANEL
BUN: 14 mg/dL (ref 6–23)
Calcium: 9.1 mg/dL (ref 8.4–10.5)
Creatinine, Ser: 1.07 mg/dL (ref 0.50–1.35)
GFR calc Af Amer: 89 mL/min — ABNORMAL LOW (ref >90)
GFR calc non Af Amer: 77 mL/min — ABNORMAL LOW (ref >90)

## 2011-12-06 MED ORDER — ATORVASTATIN CALCIUM 10 MG PO TABS
10.0000 mg | ORAL_TABLET | Freq: Every day | ORAL | Status: DC
  Administered 2011-12-06 – 2011-12-07 (×2): 10 mg via ORAL
  Filled 2011-12-06 (×3): qty 1

## 2011-12-06 NOTE — Consult Note (Signed)
Reason for Consult: For transesophageal echocardiogram to rule out cardiac source of emboli Referring Physician: Dr. Jinny Sanders is an 56 y.o. male.  HPI: Patient is 56 year old male with past medical history significant for multiple medical problems i.e. hypertension, HIV, non-insulin-dependent diabetes mellitus controlled by diet, tobacco abuse, depression, cocaine abuse, was admitted on 12/04/2011 because of right upper and lower extremity weakness associated with slurred speech and was noted to have left parietal white matter infarct and left superior temporal infarct and subacute right infarct. Cardiologic consultation is called for TEE to rule out cardiac source of emboli. Patient denies any chest pain nausea vomiting diaphoresis but patient does complaints of palpitation off and on for last one week denies any lightheadedness or syncopal episode. Patient denies any history of PND orthopnea leg swelling. Denies any cardiac workup in the past. Patient states his right arm and leg weakness has improved  Past Medical History  Diagnosis Date  . Hypertension   . HIV (human immunodeficiency virus infection)   . Depression     History reviewed. No pertinent past surgical history.  Family History  Problem Relation Age of Onset  . Hypertension Mother   . Hypertension Father     Social History:  reports that he has been smoking Cigarettes.  He has a 20.5 pack-year smoking history. He has never used smokeless tobacco. He reports that he drinks alcohol. He reports that he uses illicit drugs ("Crack" cocaine and Cocaine).  Allergies: No Known Allergies  Medications: I have reviewed the patient's current medications.  Results for orders placed during the hospital encounter of 12/03/11 (from the past 48 hour(s))  TSH     Status: Normal   Collection Time   12/04/11  4:27 PM      Component Value Range Comment   TSH 0.866  0.350 - 4.500 (uIU/mL)   BASIC METABOLIC PANEL     Status:  Abnormal   Collection Time   12/06/11  8:50 AM      Component Value Range Comment   Sodium 138  135 - 145 (mEq/L)    Potassium 3.5  3.5 - 5.1 (mEq/L)    Chloride 103  96 - 112 (mEq/L)    CO2 22  19 - 32 (mEq/L)    Glucose, Bld 136 (*) 70 - 99 (mg/dL)    BUN 14  6 - 23 (mg/dL)    Creatinine, Ser 4.78  0.50 - 1.35 (mg/dL)    Calcium 9.1  8.4 - 10.5 (mg/dL)    GFR calc non Af Amer 77 (*) >90 (mL/min)    GFR calc Af Amer 89 (*) >90 (mL/min)     No results found.  Review of Systems  Constitutional: Negative for fever and chills.  Eyes: Negative for blurred vision and double vision.  Cardiovascular: Positive for palpitations. Negative for chest pain, orthopnea, claudication and leg swelling.  Gastrointestinal: Negative for heartburn, nausea and vomiting.  Neurological: Positive for speech change. Negative for dizziness, tingling and headaches.   Blood pressure 120/80, pulse 88, temperature 97.9 F (36.6 C), temperature source Oral, resp. rate 19, height 6' (1.829 m), weight 81 kg (178 lb 9.2 oz), SpO2 100.00%. Physical Exam  Constitutional: He is oriented to person, place, and time. He appears well-developed and well-nourished.  HENT:  Head: Normocephalic and atraumatic.  Nose: Nose normal.  Mouth/Throat: No oropharyngeal exudate.  Eyes: Conjunctivae are normal. Pupils are equal, round, and reactive to light. Left eye exhibits no discharge. No scleral icterus.  Neck: Normal range of motion. Neck supple. No JVD present. No tracheal deviation present. No thyromegaly present.  Cardiovascular: Normal rate and regular rhythm.   Murmur (Soft systolic murmur noted no S3 gallop) heard. Respiratory: Effort normal and breath sounds normal. No respiratory distress. He has no wheezes. He has no rales.  GI: Soft. Bowel sounds are normal. He exhibits no distension. There is no tenderness. There is no rebound and no guarding.  Musculoskeletal: He exhibits no edema.  Lymphadenopathy:    He has no  cervical adenopathy.  Neurological: He is alert and oriented to person, place, and time.    Assessment/Plan: Status post left acute infarct/subacute right infarct rule out cardiac source of emboli Hypertension Non-insulin-dependent diabetes mellitus controlled by diet Tobacco abuse Cocaine abuse HIV Depression Plan Continue present management Will schedule for TEE for tomorrow to rule out cardiac source of emboli  Devinne Epstein N 12/06/2011, 1:23 PM

## 2011-12-06 NOTE — Progress Notes (Signed)
HPI  56 y.o. male admitted to [facility] on 12/03/11 after two days of right arm and leg weakness, as well as difficulty speaking. Patient recalls feeling long periods of heart racing and dizziness x 1 week leading up to presenting symptoms. He felt his equilibrium was off and had a couple falls. Since admission, he has had some improvement in strength and ability to ambulate with right leg, but not much improvement right upper extremity.   Subjective: No new events. Feels about the same. Tells me that right hand numbness and right side weakness, slurred speech have been present for over a week. Denies left body symptoms.  Objective: Vital signs in last 24 hours: Blood pressure 120/80, pulse 88, temperature 97.9 F (36.6 C), temperature source Oral, resp. rate 19, height 6' (1.829 m), weight 81 kg (178 lb 9.2 oz), SpO2 100.00%. Temp:  [97.6 F (36.4 C)-98.6 F (37 C)] 97.9 F (36.6 C) (05/19 0949) Pulse Rate:  [81-97] 88  (05/19 0949) Resp:  [15-19] 19  (05/19 0949) BP: (116-147)/(76-88) 120/80 mmHg (05/19 0949) SpO2:  [97 %-100 %] 100 % (05/19 0949)  Intake/Output from previous day:  I/O last 3 completed shifts: In: 360 [P.O.:360] Out: 200 [Urine:200]  GENERAL EXAM: Patient is in no distress  CARDIOVASCULAR: Regular rate and rhythm, no murmurs, no carotid bruits  NEUROLOGIC: MENTAL STATUS: awake, alert, language fluent, comprehension intact, naming intact CRANIAL NERVE: pupils equal and reactive to light, visual fields full to confrontation, extraocular muscles intact, no nystagmus, facial sensation and strength symmetric, uvula midline, shoulder shrug symmetric, tongue midline. MILD DYSARTHRIA. MOTOR: normal bulk and tone, full strength in the BUE, BLE; EXCEPT DECREASED IN RIGHT UPPER (4/5) AND LOWER EXTREMITY (5-/5). SENSORY: DECREASED TO ALL MODALITIES IN RUE AND RLE. COORDINATION: DECR FFM IN RUE. GAIT/STATION: NOT ASSESSED; BEDREST.  Lab Results:  Basename 12/03/11 1504  WBC  6.3  HGB 13.4  HCT 39.4  PLT 203   BMET  Basename 12/06/11 0850 12/04/11 0725  NA 138 135  K 3.5 3.8  CL 103 102  CO2 22 18*  GLUCOSE 136* 105*  BUN 14 14  CREATININE 1.07 1.04  CALCIUM 9.1 9.3    Studies/Results:  12/04/11  MRI HEAD WITHOUT CONTRAST  IMPRESSION:  Acute infarcts in the left parietal white matter and left superior temporal cortex. Probable subacute infarcts in the deep white matter on the right in a watershed distribution. Original Report Authenticated By: Camelia Phenes, M.D.   12/04/11 MRA HEAD WITHOUT CONTRAST  IMPRESSION:  Occlusion of the distal left internal carotid artery which ends after supplying the left posterior communicating artery.  Left M1 segment is occluded.  Poor flow or collateral flow in the left middle cerebral artery branches which are not well seen on the study.  Original Report Authenticated By: Camelia Phenes, M.D.   I reviewed above images and agree. Acute left parietal/temporal, and subacute right watershed infarcts. Left terminal ICA and M1 occlusion. Suspect cardioembolic source. -VRP  12/04/11 TTE - ejection fraction was in the range of 60% to 65%.  12/04/11 carotid u/s - No significant extracranial carotid artery stenosis demonstrated. Vertebrals are patent with antegrade flow.   Medications:     . aspirin EC  81 mg Oral Daily  . atorvastatin  10 mg Oral q1800  . enoxaparin  40 mg Subcutaneous Q24H  . lamiVUDine-zidovudine  1 tablet Oral BID  . lopinavir-ritonavir  2 tablet Oral BID  . mirtazapine  45 mg Oral QHS  . nicotine  21 mg Transdermal Q24H  . QUEtiapine  300 mg Oral QHS  . tenofovir  300 mg Oral Daily  . DISCONTD: simvastatin  20 mg Oral q1800    Assessment/Plan: Patient Active Hospital Problem List: CVA (cerebral infarction) (12/03/2011) 56 year old male with DM, HTN, tobacco use,cocaine use and HIV, with suspected cardioembolic strokes, left terminal ICA and M1 occlusion. 1. Aspirin 81 mg for stroke prophylaxis.    2. ST/OT/PT  3. Polysubstance abuse counseling  4. Smoking cessation consult; would not recommend nicotine patch for comfort, as risk of vasospasm/ stroke extension greater.  5. Telemetry monitoring  6. Agree with holding antihypertensives and allowing for permissive HTN; Goal SBP 130-150.  7. When hemodynamically stable, will require TEE to further investigate possible cardioembolic sources.  8. Terminal left ICA and M1 occlusions (intracranial) not amenable for revascularization at this point; I would favor medical management and workup for cardioembolic source.  HIV INFECTION (04/27/2006) SUBSTANCE ABUSE (04/27/2006) ESSENTIAL HYPERTENSION (04/27/2006)   LOS: 3 days   Kierston Plasencia 12/06/2011, 10:30 AM

## 2011-12-06 NOTE — Progress Notes (Signed)
Physical Therapy Treatment Patient Details Name: Jacob Joseph MRN: [id number] DOB: 06-08-1956 Today's Date: 12/06/2011 Time: 5284-1324 PT Time Calculation (min): 19 min  PT Assessment / Plan / Recommendation Comments on Treatment Session  Pt continues to require min assist for mobility.  Pt may be candidate for CIR.    Follow Up Recommendations  Home health PT;Supervision - Intermittent    Barriers to Discharge        Equipment Recommendations  3 in 1 bedside comode    Recommendations for Other Services    Frequency Min 4X/week   Plan Discharge plan remains appropriate;Frequency remains appropriate    Precautions / Restrictions Precautions Precautions: Fall   Pertinent Vitals/Pain 0/10    Mobility  Bed Mobility Bed Mobility: Supine to Sit Supine to Sit: 4: Min assist Details for Bed Mobility Assistance: verbal cues for technique Transfers Sit to Stand: 4: Min assist;From bed;With upper extremity assist Stand to Sit: 4: Min assist;To bed;To chair/3-in-1;With upper extremity assist Details for Transfer Assistance: verbal cues needed for safety Ambulation/Gait Ambulation/Gait Assistance: 4: Min assist Ambulation Distance (Feet): 30 Feet Assistive device: 1 person hand held assist Ambulation/Gait Assistance Details: verbal cues needed for increased stride length Gait Pattern: Decreased stride length;Step-to pattern Modified Rankin (Stroke Patients Only) Pre-Morbid Rankin Score: No symptoms Modified Rankin: Moderately severe disability    Exercises     PT Diagnosis:    PT Problem List:   PT Treatment Interventions:     PT Goals Acute Rehab PT Goals PT Goal: Supine/Side to Sit - Progress: Progressing toward goal PT Goal: Sit to Stand - Progress: Progressing toward goal PT Goal: Ambulate - Progress: Progressing toward goal PT Goal: Up/Down Stairs - Progress: Progressing toward goal Additional Goals PT Goal: Additional Goal #1 - Progress: Met  Visit  Information  Last PT Received On: 12/06/11 Assistance Needed: +1    Subjective Data  Subjective: feeling better Patient Stated Goal: home   Cognition  Overall Cognitive Status: Impaired Area of Impairment: Safety/judgement Arousal/Alertness: Awake/alert Orientation Level: Appears intact for tasks assessed Behavior During Session: [facility] for tasks performed Safety/Judgement: Decreased safety judgement for tasks assessed    Balance  Dynamic Sitting Balance Dynamic Sitting - Balance Support: No upper extremity supported;Feet supported Dynamic Sitting - Level of Assistance: 5: Stand by assistance Static Standing Balance Static Standing - Balance Support: No upper extremity supported Static Standing - Level of Assistance: 4: Min assist Standardized Balance Assessment Standardized Balance Assessment: Berg Balance Test Berg Balance Test Sit to Stand: Able to stand using hands after several tries Standing Unsupported: Able to stand 2 minutes with supervision Sitting with Back Unsupported but Feet Supported on Floor or Stool: Able to sit 2 minutes under supervision Stand to Sit: Controls descent by using hands Transfers: Needs one person to assist Standing Unsupported with Eyes Closed: Able to stand 3 seconds Standing Ubsupported with Feet Together: Needs help to attain position and unable to hold for 15 seconds From Standing, Reach Forward with Outstretched Arm: Reaches forward but needs supervision From Standing Position, Pick up Object from Floor: Unable to pick up and needs supervision From Standing Position, Turn to Look Behind Over each Shoulder: Needs supervision when turning Turn 360 Degrees: Needs close supervision or verbal cueing Standing Unsupported, Alternately Place Feet on Step/Stool: Able to complete >2 steps/needs minimal assist Standing Unsupported, One Foot in Front: Needs help to step but can hold 15 seconds Standing on One Leg: Tries to lift leg/unable to hold 3  seconds but remains standing  independently Total Score: 21   End of Session PT - End of Session Equipment Utilized During Treatment: Gait belt Activity Tolerance: Patient tolerated treatment well Patient left: in chair;with call bell/phone within reach    Ilda Foil 12/06/2011, 12:36 PM  Aida Raider, PT  Office # [phone] Pager [phone]

## 2011-12-06 NOTE — Progress Notes (Signed)
Subjective: Patient feels a little better. Speech is improved Is able to eat properly. Has no nausea, vomiting, abdominal pain or chest pain.  Objective: Vital signs in last 24 hours: Filed Vitals:   12/05/11 1759 12/05/11 2210 12/06/11 0207 12/06/11 0949  BP: 134/86 128/87 116/76 120/80  Pulse: 82 83 97 88  Temp: 98.1 F (36.7 C) 98.6 F (37 C) 97.6 F (36.4 C) 97.9 F (36.6 C)  TempSrc: Oral Oral Oral Oral  Resp: 15 16 19 19   Height:      Weight:      SpO2: 100% 97% 98% 100%   Weight change:   Intake/Output Summary (Last 24 hours) at 12/06/11 1059 Last data filed at 12/06/11 1017  Gross per 24 hour  Intake      0 ml  Output     75 ml  Net    -75 ml   General: NAD Neck: supple, full ROM, no thyromegaly, no JVD, and no carotid bruits.  Lungs: CTA B/L. no wheezes or rales. Heart: RRR, No M/G/R Abdomen: soft, BS x 4 Pulses: 2+ DP/PT pulses bilaterally Extremities: No edema Skin: turgor normal and no rashes.  Psych: Oriented X3, memory intact for recent and remote, normally interactive, good eye contact, not anxious appearing, and not depressed appearing.  Neurology:alert & oriented X3.  Lab Results: Basic Metabolic Panel:  Lab 12/06/11 1914 12/04/11 0725  NA 138 135  K 3.5 3.8  CL 103 102  CO2 22 18*  GLUCOSE 136* 105*  BUN 14 14  CREATININE 1.07 1.04  CALCIUM 9.1 9.3  MG -- --  PHOS -- --   Liver Function Tests:  Lab 12/03/11 1504 11/30/11 0928  AST 64* 56*  ALT 38 38  ALKPHOS 77 73  BILITOT 0.5 0.3  PROT 8.3 7.4  ALBUMIN 3.7 4.0   CBC:  Lab 12/03/11 1504 11/30/11 0928  WBC 6.3 7.3  NEUTROABS -- --  HGB 13.4 12.8*  HCT 39.4 39.5  MCV 97.8 100.5*  PLT 203 230   Hemoglobin A1C:  Lab 12/03/11 1504  HGBA1C 5.6   Fasting Lipid Panel:  Lab 12/04/11 0725  CHOL 122  HDL 41  LDLCALC 55  TRIG 129  CHOLHDL 3.0  LDLDIRECT --    Coagulation:  Lab 12/03/11 1517  LABPROT 14.1  INR 1.07   Urine Drug Screen: Drugs of Abuse       Component Value Date/Time   LABOPIA NONE DETECTED 12/03/2011 1638   COCAINSCRNUR POSITIVE* 12/03/2011 1638   LABBENZ NONE DETECTED 12/03/2011 1638   AMPHETMU NONE DETECTED 12/03/2011 1638   THCU NONE DETECTED 12/03/2011 1638   LABBARB NONE DETECTED 12/03/2011 1638    Urinalysis:  Lab 12/03/11 1638  COLORURINE AMBER*  LABSPEC 1.023  PHURINE 6.0  GLUCOSEU NEGATIVE  HGBUR LARGE*  BILIRUBINUR SMALL*  KETONESUR NEGATIVE  PROTEINUR 100*  UROBILINOGEN 4.0*  NITRITE NEGATIVE  LEUKOCYTESUR NEGATIVE   Studies/Results: No results found. Medications: I have reviewed the patient's current medications. Scheduled Meds:    . aspirin EC  81 mg Oral Daily  . atorvastatin  10 mg Oral q1800  . enoxaparin  40 mg Subcutaneous Q24H  . lamiVUDine-zidovudine  1 tablet Oral BID  . lopinavir-ritonavir  2 tablet Oral BID  . mirtazapine  45 mg Oral QHS  . nicotine  21 mg Transdermal Q24H  . QUEtiapine  300 mg Oral QHS  . tenofovir  300 mg Oral Daily  . DISCONTD: simvastatin  20 mg Oral q1800   Continuous  Infusions:   PRN Meds:.ondansetron (ZOFRAN) IV, senna-docusate, zolpidem Assessment/Plan:   # Acute left-sided cerebral infarct.  - CL as tol - Neurochecks every 4 hours  - Negative carotid Doppler and 2-D echo - MRI and MRA>>> multiple infarctions, Left LMA and ICA ?  occlusion.  - PT and OT eval  -  Aspirin for secondary prevention  - Will add statin per Neuro recs- lipitor - Dr. Otelia Limes with neurology recommended neurosurgical consult yesterday. Talked with Dr. Jeral Fruit- who recommended IR and Dr. Marlis Edelson consult before getting neurosurgical evaluation. - Meanwhile Dr. Marjory Lies saw the patient today and recommended TEE- for Cardio embolic source because the patient has bilateral strokes- which would be explained by left carotid occlusion alone and the surgery at this point of time won't change the clinical course significantly. - Talked with Dr. [name] - cardiology, who will get TEE  tomorrow. Patient will be n.p.o. from midnight. - Dr. Pearlean Brownie will see him tomorrow.   # Diabetes type 2  hemoglobin A1c 5.6 on admission.    Not on any medications  # Hypertension , SBP 110-130's He is noted to have a blood pressure of 90s/60s in ED. His home dose of lisinopril and hydrochlorothiazide are on hold to allow permissive hypertension.   # HIV medications, last CD4 410 and VL less than 20  He reports medical compliance with his HIV medications  -Will continue his ART treatments   # Substance abuse  He is a chronic cocaine abuser. His UDS is positive for cocaine on this admission  -Social worker counseling   # Tobacco abuse  Ongoing tobacco abuse for 40 years. Patient is waiting to quit now.  -Nicotine patch  -Smoking cessation counseling   #Depression, stable, continue home dose of her Seroquel and Remeron.   # VTE: Lovenox and      LOS: 3 days   Jacob Joseph 12/06/2011, 10:59 AM

## 2011-12-07 ENCOUNTER — Encounter (HOSPITAL_COMMUNITY): Payer: Self-pay

## 2011-12-07 ENCOUNTER — Encounter (HOSPITAL_COMMUNITY)
Admission: EM | Disposition: A | Discharge status: Home / Self Care | Payer: Self-pay | Source: Home / Self Care | Attending: Internal Medicine

## 2011-12-07 DIAGNOSIS — I6340 Cerebral infarction due to embolism of unspecified cerebral artery: Secondary | ICD-10-CM

## 2011-12-07 HISTORY — PX: TEE WITHOUT CARDIOVERSION: SHX5443

## 2011-12-07 SURGERY — ECHOCARDIOGRAM, TRANSESOPHAGEAL
Anesthesia: Moderate Sedation

## 2011-12-07 MED ORDER — MIDAZOLAM HCL 10 MG/2ML IJ SOLN
INTRAMUSCULAR | Status: AC
  Filled 2011-12-07: qty 4

## 2011-12-07 MED ORDER — BUTAMBEN-TETRACAINE-BENZOCAINE 2-2-14 % EX AERO
INHALATION_SPRAY | CUTANEOUS | Status: DC | PRN
  Administered 2011-12-07: 2 via TOPICAL

## 2011-12-07 MED ORDER — SODIUM CHLORIDE 0.9 % IV SOLN: INTRAVENOUS | Status: DC

## 2011-12-07 MED ORDER — DIPHENHYDRAMINE HCL 50 MG/ML IJ SOLN
INTRAMUSCULAR | Status: AC
  Filled 2011-12-07: qty 1

## 2011-12-07 MED ORDER — MIDAZOLAM HCL 10 MG/2ML IJ SOLN
INTRAMUSCULAR | Status: DC | PRN
  Administered 2011-12-07: 2 mg via INTRAVENOUS
  Administered 2011-12-07 (×2): 1 mg via INTRAVENOUS

## 2011-12-07 MED ORDER — FENTANYL CITRATE 0.05 MG/ML IJ SOLN
INTRAMUSCULAR | Status: AC
  Filled 2011-12-07: qty 4

## 2011-12-07 MED ORDER — FENTANYL CITRATE 0.05 MG/ML IJ SOLN
INTRAMUSCULAR | Status: DC | PRN
  Administered 2011-12-07 (×3): 25 ug via INTRAVENOUS

## 2011-12-07 NOTE — Progress Notes (Signed)
Subjective:  No new complaints patient underwent transesophageal echocardiogram today by Dr. Algie Coffer tolerated procedure well. No obvious cardiac source of emboli noted  Objective:  Vital Signs in the last 24 hours: Temp:  [97.4 F (36.3 C)-99.8 F (37.7 C)] 97.4 F (36.3 C) (05/20 1755) Pulse Rate:  [72-89] 72  (05/20 1755) Resp:  [17-26] 20  (05/20 1755) BP: (100-129)/(60-86) 126/85 mmHg (05/20 1755) SpO2:  [97 %-100 %] 100 % (05/20 1755)  Intake/Output from previous day: 05/19 0701 - 05/20 0700 In: -  Out: 225 [Urine:225] Intake/Output from this shift:    Physical Exam: Exam unchanged  Lab Results: No results found for this basename: WBC:2,HGB:2,PLT:2 in the last 72 hours  Basename 12/06/11 0850  NA 138  K 3.5  CL 103  CO2 22  GLUCOSE 136*  BUN 14  CREATININE 1.07   No results found for this basename: TROPONINI:2,CK,MB:2 in the last 72 hours Hepatic Function Panel No results found for this basename: PROT,ALBUMIN,AST,ALT,ALKPHOS,BILITOT,BILIDIR,IBILI in the last 72 hours No results found for this basename: CHOL in the last 72 hours No results found for this basename: PROTIME in the last 72 hours  Imaging: Imaging results have been reviewed and No results found.  Cardiac Studies:  Assessment/Plan:  Status post left acute infarct/subacute right infarct rule out cardiac source of emboli  Hypertension  Non-insulin-dependent diabetes mellitus controlled by diet  Tobacco abuse  Cocaine abuse  HIV  Depression Plan Continue present management I will sign off call if needed  LOS: 4 days    Easter Schinke N 12/07/2011, 7:33 PM

## 2011-12-07 NOTE — Progress Notes (Signed)
Occupational Therapy Treatment Patient Details Name: Chike Farrington MRN: [id number] DOB: Jul 12, 1956 Today's Date: 12/07/2011 Time: 4098-1191 OT Time Calculation (min): 34 min  OT Assessment / Plan / Recommendation Comments on Treatment Session Pt. is progressing.  Was fatigued after TEE and PT, but willing to participate.  Will need tub transfer bench and HHOT at discharge    Follow Up Recommendations  Home health OT;Supervision/Assistance - 24 hour    Barriers to Discharge       Equipment Recommendations  3 in 1 bedside comode;Rolling walker with 5" wheels;Tub/shower bench    Recommendations for Other Services    Frequency Min 3X/week   Plan Discharge plan remains appropriate    Precautions / Restrictions Precautions Precautions: Fall Restrictions Weight Bearing Restrictions: No       ADL  Tub/Shower Transfer: Performed;Minimal assistance (min cues for hand placement with sit to stand) Tub/Shower Transfer Method: Stand pivot (ambulating) Tub/Shower Transfer Equipment: Counsellor Used:  (Tub transfer bench) Transfers/Ambulation Related to ADLs: Pt. ambulates with min A and mod cues to stay inside of walker ADL Comments: Pt. is slow to process information.  Is very fatigued after PT.  Pt. demonstrates hand to mouth, and shoulder elevation to ~85* utilzing scapular elevation and shoulder internal rotation to achieve movment.  Attempted facilitation/exercise to improve Rt. UE movement; however, pt. to fatigued to fully particiapte.  Pt. demonstrates mass grasp/release.  Pt. utilized Rt. UE as a gross assist.  Pt. provided with foam built up handles for feeding.  He and spouse instructed in use of tub transfer bench for home use    OT Diagnosis:    OT Problem List:   OT Treatment Interventions:     OT Goals Acute Rehab OT Goals OT Goal Formulation: With patient Time For Goal Achievement: 12/11/11 Potential to Achieve Goals: Good ADL Goals Pt Will Perform  Eating: with modified independence (using Rt. UE) ADL Goal: Eating - Progress: Goal set today Pt Will Perform Tub/Shower Transfer: Tub transfer;with supervision;Transfer tub bench ADL Goal: Tub/Shower Transfer - Progress: Progressing toward goals  Visit Information  Last OT Received On: 12/07/11 Assistance Needed: +1    Subjective Data      Prior Functioning       Cognition  Overall Cognitive Status: Appears within functional limits for tasks assessed/performed Area of Impairment: Attention Arousal/Alertness: Awake/alert Orientation Level: Appears intact for tasks assessed Behavior During Session: Lethargic    Mobility Bed Mobility Bed Mobility: Sit to Supine Supine to Sit: 5: Supervision Sit to Supine: 5: Supervision;HOB flat Details for Bed Mobility Assistance: Cues for positioning Transfers Transfers: Sit to Stand;Stand to Sit Sit to Stand: 4: Min assist;With upper extremity assist;From bed;From chair/3-in-1 Stand to Sit: 4: Min assist;With upper extremity assist;To bed;To chair/3-in-1 Details for Transfer Assistance: Pt. requires cues to reach back for surface and not to pull on walker.  Pt. with difficulty translating weight forward with sit to stand when he is fatigued   Exercises    Balance    End of Session OT - End of Session Equipment Utilized During Treatment:  (RW) Activity Tolerance: Patient limited by fatigue Patient left: in bed;with call bell/phone within reach;with family/visitor present   Brean Carberry, Ursula Alert M 12/07/2011, 3:37 PM

## 2011-12-07 NOTE — Progress Notes (Signed)
Physical Therapy Treatment Patient Details Name: Jacob Joseph MRN: [id number] DOB: 1955/11/27 Today's Date: 12/07/2011 Time: 7829-5621 PT Time Calculation (min): 24 min  PT Assessment / Plan / Recommendation Comments on Treatment Session  Patient progressing well with ambulation and safety awareness. Patient had just gotten back from TEE however still very motivated to participate in therapy. Patients wife present throughout    Follow Up Recommendations  Home health PT;Supervision/Assistance - 24 hour    Barriers to Discharge        Equipment Recommendations  3 in 1 bedside comode;Rolling walker with 5" wheels    Recommendations for Other Services    Frequency Min 4X/week   Plan Discharge plan remains appropriate    Precautions / Restrictions Precautions Precautions: Fall   Pertinent Vitals/Pain Denied pain    Mobility  Bed Mobility Supine to Sit: 5: Supervision Details for Bed Mobility Assistance: Cues for positioning Transfers Sit to Stand: 4: Min guard;From bed;With upper extremity assist Stand to Sit: 4: Min guard;To chair/3-in-1;With upper extremity assist Details for Transfer Assistance: Cues for safe hand placement and not to pull up using RW Ambulation/Gait Ambulation/Gait Assistance: 4: Min guard Ambulation Distance (Feet): 150 Feet Assistive device: Rolling walker Ambulation/Gait Assistance Details: Cues for management of RW and for upright posture within RW Gait Pattern: Step-through pattern;Decreased stride length;Trunk flexed    Exercises     PT Diagnosis:    PT Problem List:   PT Treatment Interventions:     PT Goals Acute Rehab PT Goals PT Goal: Supine/Side to Sit - Progress: Progressing toward goal PT Goal: Sit to Stand - Progress: Progressing toward goal PT Goal: Ambulate - Progress: Progressing toward goal  Visit Information  Last PT Received On: 12/07/11 Assistance Needed: +1    Subjective Data      Cognition  Overall Cognitive  Status: Appears within functional limits for tasks assessed/performed Area of Impairment: Attention Orientation Level: Appears intact for tasks assessed Behavior During Session: [facility] for tasks performed    Balance     End of Session PT - End of Session Equipment Utilized During Treatment: Gait belt Activity Tolerance: Patient tolerated treatment well Patient left: in chair;Other (comment) (with OT in [employer])    Zeffie Bickert, Adline Potter 12/07/2011, 2:59 PM 12/07/2011 Fredrich Birks PTA [phone] pager [phone] office

## 2011-12-07 NOTE — Progress Notes (Signed)
Agree with PTA treatment note.  Alecxis Baltzell, PT DPT [phone]  

## 2011-12-07 NOTE — Progress Notes (Addendum)
Subjective: Patient feels better. States that his speech and right sided weakness have improved a little. No acute events overnight.  Objective: Vital signs in last 24 hours: Filed Vitals:   12/06/11 1406 12/06/11 2200 12/07/11 0200 12/07/11 0600  BP: 147/84 121/80 100/64 114/75  Pulse: 79 89 82 76  Temp: 98.3 F (36.8 C) 99.8 F (37.7 C) 98.1 F (36.7 C) 97.4 F (36.3 C)  TempSrc: Oral     Resp: 19 18 18 18   Height:      Weight:      SpO2: 95% 97% 97% 97%   Weight change:   Intake/Output Summary (Last 24 hours) at 12/07/11 0824 Last data filed at 12/06/11 2259  Gross per 24 hour  Intake      0 ml  Output    225 ml  Net   -225 ml   General: NAD Neck: supple, full ROM, no thyromegaly, no JVD, and no carotid bruits.  Lungs: CTA B/L. no wheezes or rales. Heart: RRR, No M/G/R Abdomen: soft, BS x 4 Pulses: 2+ DP/PT pulses bilaterally Extremities: No edema Skin: turgor normal and no rashes.  Psych: Oriented X3, memory intact for recent and remote, normally interactive, good eye contact, not anxious appearing, and not depressed appearing.  Neurology:alert & oriented X3, cranial nerves II-VI, VIII intact. Mild Expressive aphagia. Right side facial muscle weakness noted including weakness with raising eyebrow and closing right eye. Right facial droop noted. Right side sternocleidomastoid muscle and trapezius muscle weakness noted. His tongue deviate to right side. Right upper extremity muscle strength 2-3/5 with diminished reflexes. Right lower extremity strength 3/5 with hyper active reflexes. Babinskii positive. Right limbs decreased sensation noted. Right side cerebellar exams exam were inconclusive due to right side weak  Lab Results: Basic Metabolic Panel:  Lab 12/06/11 1610 12/04/11 0725  Zakhai Meisinger 138 135  K 3.5 3.8  CL 103 102  CO2 22 18*  GLUCOSE 136* 105*  BUN 14 14  CREATININE 1.07 1.04  CALCIUM 9.1 9.3  MG -- --  PHOS -- --   Liver Function Tests:  Lab 12/03/11  1504 11/30/11 0928  AST 64* 56*  ALT 38 38  ALKPHOS 77 73  BILITOT 0.5 0.3  PROT 8.3 7.4  ALBUMIN 3.7 4.0   CBC:  Lab 12/03/11 1504 11/30/11 0928  WBC 6.3 7.3  NEUTROABS -- --  HGB 13.4 12.8*  HCT 39.4 39.5  MCV 97.8 100.5*  PLT 203 230   Hemoglobin A1C:  Lab 12/03/11 1504  HGBA1C 5.6   Fasting Lipid Panel:  Lab 12/04/11 0725  CHOL 122  HDL 41  LDLCALC 55  TRIG 129  CHOLHDL 3.0  LDLDIRECT --   Thyroid Function Tests:  Lab 12/04/11 1627  TSH 0.866  T4TOTAL --  FREET4 --  T3FREE --  THYROIDAB --   Coagulation:  Lab 12/03/11 1517  LABPROT 14.1  INR 1.07   Urine Drug Screen: Drugs of Abuse     Component Value Date/Time   LABOPIA NONE DETECTED 12/03/2011 1638   COCAINSCRNUR POSITIVE* 12/03/2011 1638   LABBENZ NONE DETECTED 12/03/2011 1638   AMPHETMU NONE DETECTED 12/03/2011 1638   THCU NONE DETECTED 12/03/2011 1638   LABBARB NONE DETECTED 12/03/2011 1638    Urinalysis:  Lab 12/06/11 1547 12/03/11 1638  COLORURINE AMBER* AMBER*  LABSPEC 1.027 1.023  PHURINE 6.5 6.0  GLUCOSEU NEGATIVE NEGATIVE  HGBUR MODERATE* LARGE*  BILIRUBINUR SMALL* SMALL*  KETONESUR 15* NEGATIVE  PROTEINUR 30* 100*  UROBILINOGEN 2.0* 4.0*  NITRITE  NEGATIVE NEGATIVE  LEUKOCYTESUR SMALL* NEGATIVE  Medications: I have reviewed the patient's current medications. Scheduled Meds:   . aspirin EC  81 mg Oral Daily  . atorvastatin  10 mg Oral q1800  . enoxaparin  40 mg Subcutaneous Q24H  . lamiVUDine-zidovudine  1 tablet Oral BID  . lopinavir-ritonavir  2 tablet Oral BID  . mirtazapine  45 mg Oral QHS  . QUEtiapine  300 mg Oral QHS  . tenofovir  300 mg Oral Daily  . DISCONTD: nicotine  21 mg Transdermal Q24H   Continuous Infusions:  PRN Meds:.ondansetron (ZOFRAN) IV, senna-docusate, zolpidem Assessment/Plan:  # Acute left-sided cerebral infarct, subacute right deep white matter infarct.    - Neurochecks every 4 hours  - Negative carotid Doppler -  2-D echo >>>Grade 1  diastolic dysfunction - MRI and MRA>>> B/L infarctions, Left LMA and ICA ? occlusion.  - PT and OT eval >>>case manager called and will set up [facility] PT/OT and bedside commode.  - Aspirin for secondary prevention  - Will add statin per Neuro recs- lipitor  -TEE today  - Dr. Pearlean Brownie will see him today  # Diabetes type 2 hemoglobin A1c 5.6 on admission.  Not on any medications   # Hypertension , SBP 110-130's   His home dose of lisinopril and hydrochlorothiazide are on hold to allow permissive hypertension.   # HIV medications, last CD4 410 and VL less than 20  He reports medical compliance with his HIV medications  -Will continue his ART treatments   # Substance abuse  He is a chronic cocaine abuser. His UDS is positive for cocaine on this admission  -Social worker counseling   # Tobacco abuse  Ongoing tobacco abuse for 40 years. Patient is waiting to quit now.  -Nicotine patch D/C'd in the setting of multiple strokes -Smoking cessation counseling   #Depression, stable, continue home dose of her Seroquel and Remeron.   # VTE: Lovenox    LOS: 4 days   Roger Fasnacht 12/07/2011, 8:24 AM

## 2011-12-07 NOTE — Progress Notes (Signed)
Stroke Team Progress Note  HISTORY 56 y.o. male admitted to [facility] on 12/03/11 after two days of right arm and leg weakness, as well as difficulty speaking. Patient recalls feeling long periods of heart racing and dizziness x 1 week leading up to presenting symptoms. He felt his equilibrium was off and had a couple falls. Since admission, he has had some improvement in strength and ability to ambulate with right leg, but not much improvement right upper extremity. Patient was not a TPA candidate secondary to delay in arrival. He was admitted for further evaluation and treatment.  SUBJECTIVE No one is at the bedside.  Overall he feels his condition is stable. No new complaints.He denies any p/h/o stroke or TIA.  OBJECTIVE Most recent Vital Signs: Filed Vitals:   12/06/11 1406 12/06/11 2200 12/07/11 0200 12/07/11 0600  BP: 147/84 121/80 100/64 114/75  Pulse: 79 89 82 76  Temp: 98.3 F (36.8 C) 99.8 F (37.7 C) 98.1 F (36.7 C) 97.4 F (36.3 C)  TempSrc: Oral     Resp: 19 18 18 18   Height:      Weight:      SpO2: 95% 97% 97% 97%   Intake/Output from previous day: 05/19 0701 - 05/20 0700 In: -  Out: 225 [Urine:225]  IV Fluid Intake:     MEDICATIONS    . aspirin EC  81 mg Oral Daily  . atorvastatin  10 mg Oral q1800  . enoxaparin  40 mg Subcutaneous Q24H  . lamiVUDine-zidovudine  1 tablet Oral BID  . lopinavir-ritonavir  2 tablet Oral BID  . mirtazapine  45 mg Oral QHS  . QUEtiapine  300 mg Oral QHS  . tenofovir  300 mg Oral Daily  . DISCONTD: nicotine  21 mg Transdermal Q24H   PRN:  ondansetron (ZOFRAN) IV, senna-docusate, zolpidem  Diet:  NPO for TEE Activity:  As tolerated DVT Prophylaxis:  Lovenox 40 mg sq daily   CLINICALLY SIGNIFICANT STUDIES Basic Metabolic Panel:  Lab 12/06/11 9604 12/04/11 0725  NA 138 135  K 3.5 3.8  CL 103 102  CO2 22 18*  GLUCOSE 136* 105*  BUN 14 14  CREATININE 1.07 1.04  CALCIUM 9.1 9.3  MG -- --  PHOS -- --   Liver Function Tests:    Lab 12/03/11 1504 11/30/11 0928  AST 64* 56*  ALT 38 38  ALKPHOS 77 73  BILITOT 0.5 0.3  PROT 8.3 7.4  ALBUMIN 3.7 4.0   CBC:  Lab 12/03/11 1504 11/30/11 0928  WBC 6.3 7.3  NEUTROABS -- --  HGB 13.4 12.8*  HCT 39.4 39.5  MCV 97.8 100.5*  PLT 203 230   Coagulation:  Lab 12/03/11 1517  LABPROT 14.1  INR 1.07   Urinalysis:  Lab 12/06/11 1547 12/03/11 1638  COLORURINE AMBER* AMBER*  LABSPEC 1.027 1.023  PHURINE 6.5 6.0  GLUCOSEU NEGATIVE NEGATIVE  HGBUR MODERATE* LARGE*  BILIRUBINUR SMALL* SMALL*  KETONESUR 15* NEGATIVE  PROTEINUR 30* 100*  UROBILINOGEN 2.0* 4.0*  NITRITE NEGATIVE NEGATIVE  LEUKOCYTESUR SMALL* NEGATIVE   Lipid Panel    Component Value Date/Time   CHOL 122 12/04/2011 0725   TRIG 129 12/04/2011 0725   HDL 41 12/04/2011 0725   CHOLHDL 3.0 12/04/2011 0725   VLDL 26 12/04/2011 0725   LDLCALC 55 12/04/2011 0725   HgbA1C  Lab Results  Component Value Date   HGBA1C 5.6 12/03/2011    Urine Drug Screen:     Component Value Date/Time   LABOPIA NONE DETECTED  12/03/2011 1638   COCAINSCRNUR POSITIVE* 12/03/2011 1638   LABBENZ NONE DETECTED 12/03/2011 1638   AMPHETMU NONE DETECTED 12/03/2011 1638   THCU NONE DETECTED 12/03/2011 1638   LABBARB NONE DETECTED 12/03/2011 1638    Alcohol Level: No results found for this basename: ETH:2 in the last 168 hours  Thyroid Function Tests:  Lab 12/04/11 1627  TSH 0.866  T4TOTAL --  FREET4 --  T3FREE --  THYROIDAB --   CT of the brain  12/02/2029  Linear hypodensity left frontal cortex and subcortical white matter without significant hemorrhage or mass effect is suspicious for acute infarction. See comments above. Correlate clinically for other potential processes which might appear similar.  Slight progression of atrophy from priors.  MRI of the brain   Acute infarcts in the left parietal white matter and left superior temporal cortex.  Probable subacute infarcts in the deep white matter on the right in a watershed  distribution.  MRA of the brain  Occlusion of the distal left internal carotid artery which ends after supplying the left posterior communicating artery.  Left M1 segment is occluded.  Poor flow or collateral flow in the left middle cerebral artery branches which are not well seen on the study.  2D Echocardiogram  EF 55-60% with no source of embolus.   Carotid Doppler  No internal carotid artery stenosis bilaterally. Vertebrals with antegrade flow bilaterally.   CXR  12/04/2011 No active cardiopulmonary disease.  TEE pending  EKG  normal sinus rhythm.   Therapy Recommendations PT -home health, OT -home health  GENERAL EXAM:  Patient is in no distress  CARDIOVASCULAR:  Regular rate and rhythm, no murmurs, no carotid bruits  NEUROLOGIC:  MENTAL STATUS: awake, alert, language fluent, comprehension intact, naming intact  CRANIAL NERVE: pupils equal and reactive to light, visual fields full to confrontation, extraocular muscles intact, no nystagmus, facial sensation and strength symmetric, uvula midline, shoulder shrug symmetric, tongue midline. MILD DYSARTHRIA.  MOTOR: normal bulk and tone, full strength in the BUE, BLE; EXCEPT DECREASED IN RIGHT UPPER (4/5) AND LOWER EXTREMITY (5-/5). Orbits left over right upper extremity. SENSORY: DECREASED TO ALL MODALITIES IN RUE AND RLE.  COORDINATION: DECR FFM IN RUE.  GAIT/STATION: NOT ASSESSED; BEDREST.  ASSESSMENT [name] is a 56 y.o. male with acute infarcts in the left parietal white matter and left superior temporal cortex as well as subacute infarcts in the right deep white matter in a watershed distribution. Infarcts secondary to terminal L ICA and M1 occlusion due to unknown embolic source. Likely due to arrythmia associated with cocaine abuse. On no antiplatelets prior to admission. Now on aspirin 81 mg orally every day for secondary stroke prevention. Patient with resultant right hemiparesis,  dysarthria.  -hypertension -Diabetes -tobacco abuse -cocaine use -HIV  Agree no need for surgical or IR involvement. Terminal left ICA and M1 occlusions (intracranial) not amenable for revascularization;  medical management recommended.  Hospital day # 4  TREATMENT/PLAN -Continue aspirin 81 mg orally every day for secondary stroke prevention.Add Plavix 75 mg daily for 3 months. -TEE to look for embolic source. If positive for PFO (patent foramen ovale), needs bilateral lower extremity dopplers to rule out DVT as source of stroke. -ok for discharge after TEE from neuro standpoint -home health PT & OT -patient counseled to stop cocaine and take medications  SHARON BIBY, AVNP, ANP-BC, GNP-BC [facility] Pager: [phone] 12/07/2011 7:54 AM  Scribe for Dr. Delia Heady, Stroke Center Medical Director. He has personally  reviewed chart, pertinent data, examined the patient and developed the plan of care. Pager:  [phone]

## 2011-12-07 NOTE — CV Procedure (Signed)
INDICATIONS:   The patient is 56 years old black male with CVA, here for rule out cardiac source of emboli.  PROCEDURE:  Informed consent was discussed including risks, benefits and alternatives for the procedure.  Risks include, but are not limited to, cough, sore throat, vomiting, nausea, somnolence, esophageal and stomach trauma or perforation, bleeding, low blood pressure, aspiration, pneumonia, infection, trauma to the teeth and death.    Patient was given sedation.  The oropharynx was anesthetized with topical lidocaine.  The transesophageal probe was inserted in the esophagus and stomach and multiple views were obtained.  Agitated saline was used after the transesophageal probe was removed from the body.  The patient was kept under observation until the patient left the procedure room.  The patient left the procedure room in stable condition.   COMPLICATIONS:  There were no immediate complications.  FINDINGS:  1. LEFT VENTRICLE: The left ventricle is normal in structure and function.  Wall motion is normal.  No thrombus or masses seen in the left ventricle. Mild LVH.  2. RIGHT VENTRICLE:  The right ventricle is normal in structure and function without any thrombus or masses.    3. LEFT ATRIUM:  The left atrium is normal without any thrombus or masses.  4. LEFT ATRIAL APPENDAGE:  The left atrial appendage is free of any thrombus or masses.  5. RIGHT ATRIUM:  The right atrium is free of any thrombus or masses.  Large Eustachian valve was seen.  6. ATRIAL SEPTUM:  The atrial septum is normal without any ASD or PFO. Negative bubble study.  7. MITRAL VALVE:  The mitral valve is normal in structure and function without masses, stenosis or vegetations. Multiple jets of mild MR was seen.  8. TRICUSPID VALVE:  The tricuspid valve is normal in structure and function without regurgitation, masses, stenosis or vegetations.  9. AORTIC VALVE:  The aortic valve is normal in structure and function  without regurgitation, masses, stenosis or vegetations.   10. PULMONIC VALVE:  The pulmonic valve is normal in structure and function without regurgitation, masses, stenosis or vegetations.  11. AORTIC ARCH, ASCENDING AND DESCENDING AORTA:  The aorta had no atherosclerosis in the ascending or descending aorta.  The aortic arch was normal.  IMPRESSION:    Mild LVH and mild MR.  No cardiac source of emboli. Negative bubble study x 2.  RECOMMENDATIONS:      Medical management.  [name] Kitchen

## 2011-12-07 NOTE — Consult Note (Signed)
Subjective:  Feeling better. Here for TEE.  Objective:  Vital Signs in the last 24 hours: Temp:  [97.4 F (36.3 C)-99.8 F (37.7 C)] 97.6 F (36.4 C) (05/20 1127) Pulse Rate:  [76-89] 76  (05/20 0600) Cardiac Rhythm:  [-] Normal sinus rhythm (05/20 0800) Resp:  [18-21] 21  (05/20 1127) BP: (100-147)/(64-86) 124/86 mmHg (05/20 1127) SpO2:  [95 %-99 %] 98 % (05/20 1153)  Physical Exam: BP Readings from Last 1 Encounters:  12/07/11 124/86    Wt Readings from Last 1 Encounters:  12/04/11 81 kg (178 lb 9.2 oz)    Weight change:   HEENT: [place]/AT, Eyes-Brown, PERL, EOMI, Conjunctiva-Pink, Sclera-Non-icteric Neck: No JVD, No bruit, Trachea midline. Lungs:  Clear, Bilateral. Cardiac:  Regular rhythm, normal S1 and S2, no S3.  Abdomen:  Soft, non-tender. Extremities:  No edema present. No cyanosis. No clubbing. CNS: AxOx3, Cranial nerves grossly intact, moves all 4 extremities. Right handed. Skin: Warm and dry.   Intake/Output from previous day: 05/19 0701 - 05/20 0700 In: -  Out: 225 [Urine:225]    Lab Results: BMET    Component Value Date/Time   NA 138 12/06/2011 0850   K 3.5 12/06/2011 0850   CL 103 12/06/2011 0850   CO2 22 12/06/2011 0850   GLUCOSE 136* 12/06/2011 0850   BUN 14 12/06/2011 0850   CREATININE 1.07 12/06/2011 0850   CREATININE 1.28 11/30/2011 0928   CALCIUM 9.1 12/06/2011 0850   GFRNONAA 77* 12/06/2011 0850   GFRAA 89* 12/06/2011 0850   CBC    Component Value Date/Time   WBC 6.3 12/03/2011 1504   RBC 4.03* 12/03/2011 1504   HGB 13.4 12/03/2011 1504   HCT 39.4 12/03/2011 1504   PLT 203 12/03/2011 1504   MCV 97.8 12/03/2011 1504   MCH 33.3 12/03/2011 1504   MCHC 34.0 12/03/2011 1504   RDW 14.8 12/03/2011 1504   LYMPHSABS 2.5 08/14/2010 2043   MONOABS 0.5 08/14/2010 2043   EOSABS 0.3 08/14/2010 2043   BASOSABS 0.0 08/14/2010 2043   CARDIAC ENZYMES Lab Results  Component Value Date   CKTOTAL 286* 07/04/2008    Assessment/Plan:  Patient Active Hospital Problem  List: CVA (cerebral infarction) (12/03/2011) HIV INFECTION (04/27/2006) SUBSTANCE ABUSE (04/27/2006) ESSENTIAL HYPERTENSION (04/27/2006)  TEE to r/o cardiac source of emboli   LOS: 4 days    Orpah Cobb  MD  12/07/2011, 12:17 PM

## 2011-12-07 NOTE — Progress Notes (Signed)
  Echocardiogram Echocardiogram Transesophageal has been performed.  Jacob Joseph, Real Cons 12/07/2011, 12:52 PM

## 2011-12-08 ENCOUNTER — Encounter (HOSPITAL_COMMUNITY): Payer: Self-pay | Admitting: Cardiovascular Disease

## 2011-12-08 ENCOUNTER — Ambulatory Visit: Payer: Self-pay | Admitting: Internal Medicine

## 2011-12-08 DIAGNOSIS — I6340 Cerebral infarction due to embolism of unspecified cerebral artery: Secondary | ICD-10-CM

## 2011-12-08 MED ORDER — ATORVASTATIN CALCIUM 10 MG PO TABS: 10.0000 mg | ORAL_TABLET | Freq: Every day | ORAL | Status: DC

## 2011-12-08 MED ORDER — ATORVASTATIN CALCIUM 10 MG PO TABS
10.0000 mg | ORAL_TABLET | Freq: Once | ORAL | Status: AC
  Administered 2011-12-08: 10 mg via ORAL
  Filled 2011-12-08: qty 1

## 2011-12-08 MED ORDER — ASPIRIN 81 MG PO TBEC: 81.0000 mg | DELAYED_RELEASE_TABLET | Freq: Every day | ORAL | Status: DC

## 2011-12-08 MED ORDER — CLOPIDOGREL BISULFATE 75 MG PO TABS: 75.0000 mg | ORAL_TABLET | Freq: Every day | ORAL | Status: DC

## 2011-12-08 MED ORDER — CLOPIDOGREL BISULFATE 75 MG PO TABS
75.0000 mg | ORAL_TABLET | Freq: Every day | ORAL | Status: DC
  Administered 2011-12-08: 75 mg via ORAL
  Filled 2011-12-08: qty 1

## 2011-12-08 NOTE — Progress Notes (Signed)
Occupational Therapy Treatment Patient Details Name: Jacob Joseph MRN: [id number] DOB: [birth year] Today's Date: 12/08/2011 Time: 4098-1191 OT Time Calculation (min): 17 min  OT Assessment / Plan / Recommendation Comments on Treatment Session Pt and wife eager to discharge.  Wife able to safely assist pt with ADLs, and pt. demonstrates independence with HEP    Follow Up Recommendations  Home health OT;Supervision/Assistance - 24 hour    Barriers to Discharge       Equipment Recommendations  3 in 1 bedside comode;Rolling walker with 5" wheels;Tub/shower bench    Recommendations for Other Services    Frequency Min 3X/week   Plan Discharge plan remains appropriate    Precautions / Restrictions Precautions Precautions: Fall Restrictions Weight Bearing Restrictions: No       ADL  Upper Body Dressing: Performed;Moderate assistance Where Assessed - Upper Body Dressing: Unsupported sitting Equipment Used: Rolling walker Transfers/Ambulation Related to ADLs: Min A.  Pt. demonstrates difficulty with forward excursion with sit to stand ADL Comments: [name] wife is able to assist pt. safely with ADLs.  Pt. was instructed in HEP for shoulder flexion focusing on good shoulder alignment.  Pt. able to return understanding.  Demonstrates ~115* shoulder flexion with good alignment    OT Diagnosis:    OT Problem List:   OT Treatment Interventions:     OT Goals Acute Rehab OT Goals Time For Goal Achievement: 12/11/11 ADL Goals ADL Goal: Upper Body Dressing - Progress: Progressing toward goals Miscellaneous OT Goals OT Goal: Miscellaneous Goal #1 - Progress: Progressing toward goals  Visit Information  Last OT Received On: 12/08/11 Assistance Needed: +1    Subjective Data      Prior Functioning       Cognition  Overall Cognitive Status: Appears within functional limits for tasks assessed/performed Area of Impairment: Attention Arousal/Alertness: Awake/alert Orientation Level:  Appears intact for tasks assessed Behavior During Session: Lethargic    Mobility Bed Mobility Bed Mobility: Sit to Supine Supine to Sit: 4: Min assist;HOB flat Transfers Transfers: Sit to Stand;Stand to Sit Sit to Stand: 4: Min assist;With upper extremity assist;From bed;From chair/3-in-1 Stand to Sit: 4: Min assist;With upper extremity assist;To bed;To chair/3-in-1 Details for Transfer Assistance: Pt. requires cues to reach back for surface and not to pull on walker.  Pt. with difficulty translating weight forward with sit to stand when he is fatigued   Exercises    Balance Balance Balance Assessed: Yes Dynamic Standing Balance Dynamic Standing - Level of Assistance: 4: Min assist  End of Session OT - End of Session Activity Tolerance: Patient limited by fatigue Patient left: in chair;with call bell/phone within reach;with family/visitor present   Jacob Joseph, Ursula Alert M 12/08/2011, 12:37 PM

## 2011-12-08 NOTE — Progress Notes (Signed)
Physical Therapy Treatment Patient Details Name: Jacob Joseph MRN: [id number] DOB: May 29, 1956 Today's Date: 12/08/2011 Time: 5284-1324 PT Time Calculation (min): 18 min  PT Assessment / Plan / Recommendation Comments on Treatment Session  Patient progressing well with ambulation with RW and with stairs.     Follow Up Recommendations  Home health PT;Supervision/Assistance - 24 hour    Barriers to Discharge        Equipment Recommendations  Rolling walker with 5" wheels;3 in 1 bedside comode;Tub/shower bench    Recommendations for Other Services    Frequency Min 4X/week   Plan Discharge plan remains appropriate    Precautions / Restrictions Precautions Precautions: Fall Restrictions Weight Bearing Restrictions: No   Pertinent Vitals/Pain Denied pain    Mobility  Bed Mobility Bed Mobility: Not assessed Supine to Sit: 4: Min assist;HOB flat Transfers Sit to Stand: 4: Min guard;From chair/3-in-1;With armrests;With upper extremity assist Stand to Sit: 4: Min guard;With upper extremity assist;With armrests;To chair/3-in-1 Details for Transfer Assistance: Cues for safe hand placement and to control positioning and not to "fall" back into recliner.  Ambulation/Gait Ambulation/Gait Assistance: 4: Min guard Ambulation Distance (Feet): 180 Feet Assistive device: Rolling walker Ambulation/Gait Assistance Details: Cues for management of RW and to stand closer and maintain upright body posture Gait Pattern: Step-through pattern;Decreased stride length;Trunk flexed Stairs: Yes Stairs Assistance: 4: Min guard Stair Management Technique: Two rails;Forwards;Alternating pattern Number of Stairs: 5     Exercises     PT Diagnosis:    PT Problem List:   PT Treatment Interventions:     PT Goals Acute Rehab PT Goals PT Goal: Sit to Stand - Progress: Progressing toward goal PT Goal: Ambulate - Progress: Progressing toward goal PT Goal: Up/Down Stairs - Progress: Progressing toward  goal  Visit Information  Last PT Received On: 12/08/11 Assistance Needed: +1    Subjective Data      Cognition  Overall Cognitive Status: Appears within functional limits for tasks assessed/performed Area of Impairment: Attention Arousal/Alertness: Awake/alert Orientation Level: Appears intact for tasks assessed Behavior During Session: [facility] for tasks performed    Balance  Balance Balance Assessed: Yes Dynamic Standing Balance Dynamic Standing - Level of Assistance: 4: Min assist  End of Session PT - End of Session Equipment Utilized During Treatment: Gait belt Activity Tolerance: Patient tolerated treatment well Patient left: in chair;with call bell/phone within reach;with family/visitor present Nurse Communication: Mobility status    Fredrich Birks 12/08/2011, 2:42 PM 12/08/2011 Fredrich Birks PTA [phone] pager [phone] office

## 2011-12-08 NOTE — Discharge Summary (Signed)
Internal Medicine Teaching [facility] Discharge Note  Name: Farren Nelles MRN: [id number] DOB: 15-Jul-1956 56 y.o.  Date of Admission: 12/03/2011  2:48 PM Date of Discharge: 12/08/2011 Attending Physician: Burns Spain, MD  Discharge Diagnosis: 1. Acute CVA (cerebral infarction) 2. DM type II 3. Essential Hypertension 4. HIV INFECTION 5. Polysubstance abuse 6. Depression 7. Calcium oxalate crystals in urine  Discharge Medications: Medication List  As of 12/08/2011  4:03 PM   STOP taking these medications         hydrochlorothiazide 25 MG tablet      lisinopril 20 MG tablet      nicotine 21 mg/24hr patch         TAKE these medications         aspirin 81 MG EC tablet   Take 1 tablet (81 mg total) by mouth daily.      atorvastatin 10 MG tablet   Commonly known as: LIPITOR   Take 1 tablet (10 mg total) by mouth daily at 6 PM.      clopidogrel 75 MG tablet   Commonly known as: PLAVIX   Take 1 tablet (75 mg total) by mouth daily.      lamiVUDine-zidovudine 150-300 MG per tablet   Commonly known as: COMBIVIR   Take 1 tablet by mouth 2 (two) times daily.      lopinavir-ritonavir 200-50 MG per tablet   Commonly known as: KALETRA   Take 2 tablets by mouth 2 (two) times daily.      mirtazapine 30 MG tablet   Commonly known as: REMERON   Take 45 mg by mouth at bedtime.      QUEtiapine 300 MG tablet   Commonly known as: SEROQUEL   Take 300 mg by mouth at bedtime.      tenofovir 300 MG tablet   Commonly known as: VIREAD   Take 300 mg by mouth daily.      zolpidem 5 MG tablet   Commonly known as: AMBIEN   Take 5 mg by mouth at bedtime as needed. For insomnia.            Disposition and follow-up:   Mr.Jayr Wiechman was discharged from [facility] in Stable condition.    Follow-up Appointments: Follow-up Information    Follow up with Lars Mage, MD on 12/23/2011. (at 915 am)    Contact information:   [address] [place] [address] [phone]    1. Acute CVA: please ensure continuous recovery of right sided weakness and dysarthria. He is discharged with ASA 81 mg po daily, Lipitor 10 mg po daily and Plavix 75 mg po daily( 3 months). Please ensure medical compliance. 2. DM, good control without medical treatment 3. HTN, his BP were well controlled during this hospitalization without medications. All of his antihypertensive medications were D/C'd upon discharge. Please follow up.  4. Substance abuse, he was instructed to stop Cocaine and Tobacco abuse. HH SW placed to assist him 5.Calcium Oxalate crystals in urine associate with transient hematuria, Asymptomatic, please follow up as an outpatient.  6. Please ensure that he has contacted Dr. Marlis Edelson office for an outpatient follow up visit.     Follow up with Cliffton Asters, MD on 12/21/2011. (at 2pm )    Contact information:   [address] [place] [address] [phone]       Follow up with [name], MD. Schedule an appointment as soon as possible for a visit in 2  months.   Contact information:   [address] [facility] [place] [address] [phone]         Discharge Orders    Future Appointments: Provider: Department: Dept Phone: Center:   12/21/2011 2:00 PM Cliffton Asters, MD [facility] [phone] [facility]   12/23/2011 9:15 AM Lars Mage, MD [facility] [phone] [facility]      Consultations:  Neurology  Procedures Performed:  Dg Chest 2 View  12/04/2011  *RADIOLOGY REPORT*  Clinical Data: Headache, stroke.  CHEST - 2 VIEW  Comparison: 04/16/2008  Findings: Heart and mediastinal contours are within normal limits. No focal opacities or effusions.  No acute bony abnormality.  IMPRESSION: No active cardiopulmonary disease.  Original Report Authenticated By: Cyndie Chime, M.D.   Ct Head Wo Contrast  12/03/2011  *RADIOLOGY REPORT*   Clinical Data: 2 days ago the patient woke up with right upper and lower extremity weakness along with difficulty speaking.  History of HIV.  History of diabetes.  History of hepatitis C.  CT HEAD WITHOUT CONTRAST  Technique:  Contiguous axial images were obtained from the base of the skull through the vertex without contrast.  Comparison: 04/16/2008.  Findings:  A linear hypodensity involving the left frontal cortex and subcortical white matter (image 20-21, series 2)  is suspicious for acute infarction.No acute hemorrhage, mass lesion, hydrocephalus, or extra-axial fluid.  Mild atrophy.  Mild chronic microvascular ischemic change.  Calvarium intact.  No acute sinus or mastoid disease.  Compared with 2009, there is slight progression of atrophy.  The left frontal hypodensity is new.  In this patient with history of HIV, an asymmetric intra-axial hypodensity as is seen in this scan could represent an opportunistic infection or even tumor although felt to be less likely.  If the history is not suggestive of a sudden onset event typical of an infarct, consider MRI brain without and with contrast for further evaluation.  IMPRESSION: Linear hypodensity left frontal cortex and subcortical white matter without significant hemorrhage or mass effect is suspicious for acute infarction. See comments above. Correlate clinically for other potential processes which might appear similar.  Slight progression of atrophy from priors.  Original Report Authenticated By: Elsie Stain, M.D.   Mr Brain Wo Contrast  12/04/2011  *RADIOLOGY REPORT*  Clinical Data:  Stroke.  Hypertension and HIV  MRI HEAD WITHOUT CONTRAST MRA HEAD WITHOUT CONTRAST  Technique:  Multiplanar, multiecho pulse sequences of the brain and surrounding structures were obtained without intravenous contrast. Angiographic images of the head were obtained using MRA technique without contrast.  Comparison:  CT 12/03/2011  MRI HEAD  Findings:  Acute infarct in the  deep white matter in the left parietal lobe.  Small areas of acute infarct in the superior temporal lobe on the left.  Diffusion weighted imaging reveals  patchy areas of slight restricted diffusion in the deep white matter in the right frontal and parietal lobes which may be due to subacute infarcts in a watershed distribution.  Abnormal signal on the left middle cerebral artery suggesting occlusion.  Brainstem and cerebellum are intact.  Ventricle size is normal. Mild atrophy is present.  No mass lesion is evident.  IMPRESSION: Acute infarcts in the left parietal white matter and left superior temporal cortex.  Probable subacute infarcts in the deep white matter on the right in a watershed distribution.  MRA HEAD  Findings: Both vertebral arteries are patent to the basilar without stenosis.  Basilar,  superior cerebellar and posterior cerebral arteries are patent bilaterally.  Hypoplastic left P1 segment with fetal origin of the left posterior cerebral artery.  Left internal carotid artery ends in the posterior communicating artery which is patent.  The left middle cerebral artery and left A1 segment are not visualized.  Mild stenosis in the right cavernous carotid.  Right anterior and middle cerebral arteries are patent.  Left anterior cerebral artery is supplied from the right and is patent.  Negative for cerebral aneurysm.  IMPRESSION:  Occlusion of the distal left internal carotid artery which ends after supplying the left posterior communicating artery.  Left M1 segment is occluded.  Poor flow or collateral flow in the left middle cerebral artery branches which are not well seen on the study.  Original Report Authenticated By: Camelia Phenes, M.D.   Mr Mra Head/brain Wo Cm  12/04/2011  *RADIOLOGY REPORT*  Clinical Data:  Stroke.  Hypertension and HIV  MRI HEAD WITHOUT CONTRAST MRA HEAD WITHOUT CONTRAST  Technique:  Multiplanar, multiecho pulse sequences of the brain and surrounding structures were obtained  without intravenous contrast. Angiographic images of the head were obtained using MRA technique without contrast.  Comparison:  CT 12/03/2011  MRI HEAD  Findings:  Acute infarct in the deep white matter in the left parietal lobe.  Small areas of acute infarct in the superior temporal lobe on the left.  Diffusion weighted imaging reveals  patchy areas of slight restricted diffusion in the deep white matter in the right frontal and parietal lobes which may be due to subacute infarcts in a watershed distribution.  Abnormal signal on the left middle cerebral artery suggesting occlusion.  Brainstem and cerebellum are intact.  Ventricle size is normal. Mild atrophy is present.  No mass lesion is evident.  IMPRESSION: Acute infarcts in the left parietal white matter and left superior temporal cortex.  Probable subacute infarcts in the deep white matter on the right in a watershed distribution.  MRA HEAD  Findings: Both vertebral arteries are patent to the basilar without stenosis.  Basilar,  superior cerebellar and posterior cerebral arteries are patent bilaterally.  Hypoplastic left P1 segment with fetal origin of the left posterior cerebral artery.  Left internal carotid artery ends in the posterior communicating artery which is patent.  The left middle cerebral artery and left A1 segment are not visualized.  Mild stenosis in the right cavernous carotid.  Right anterior and middle cerebral arteries are patent.  Left anterior cerebral artery is supplied from the right and is patent.  Negative for cerebral aneurysm.  IMPRESSION:  Occlusion of the distal left internal carotid artery which ends after supplying the left posterior communicating artery.  Left M1 segment is occluded.  Poor flow or collateral flow in the left middle cerebral artery branches which are not well seen on the study.  Original Report Authenticated By: Camelia Phenes, M.D.    2D Echo:  - Left ventricle: The cavity size was normal. Wall  thickness was normal. Systolic function was normal. The estimated ejection fraction was in the range of 60% to 65%. Doppler parameters are consistent with abnormal left ventricular relaxation (grade 1 diastolic dysfunction). - Mitral valve: Mild regurgitation. - Pulmonary arteries: PA peak pressure: 37mm Hg (S).  TEE: - Left ventricle: There was mild concentric hypertrophy. Systolic function was normal. Wall motion was normal; there were no regional wall motion abnormalities. - Mitral valve: Mild regurgitation, with multiple jets. - Left atrium: No evidence of thrombus in the  atrial cavity or appendage. - Right atrium: No evidence of thrombus in the atrial cavity or appendage. - Atrial septum: No defect or patent foramen ovale was identified by sonicated saline injection  Carotid Doppler No significant extracranial carotid artery stenosis demonstrated. Vertebrals are patent with antegrade flow.  Admission HPI:  This is a 56 year old man with PMH of HIV ( last CD4 410 and VL < 20 on 11/30/11), DM type II, HTN, and current Tobacco abuse who  Presents with right-sided weakness and slurred speech.  Patient states that he noticed gradual onset right-sided weakness and numbness while resting on chair 2 days ago His wife also noticed that he had some degree of slurred speech. Patient states that it was hard for him to find words. He has had a transient blurry vision which resolved by the time he arrived in ED. He also reports intermittent dizziness, lightheadedness and vertigo for past two days. Patient did not seek any medical attention because he thought that he would get better on his own. Unfortunately, his symptoms persisted, and he came to ED early evaluation today. His head CT showed ? Acute left sided frontal infarct.   Hospital Course by problem list: 1. Acute CVA (cerebral infarction)    Patient presented with a right-sided weakness and slurred speech on admission.  He was found to  have acute infarcts in the left parietal white matter and left superior temporal cortex as well as subacute infarcts in the right deep white matter in a watershed distribution. The occlusion of distal left ICA and left M1 segment were also noted on MRI/MRA. Given his chronic cocaine abuse and self-reported palpitations one week prior to the stroke, the etiology of his bilateral brain infarcts is likely due to embolic source associated with possible arrythmia secondary to his cocaine abuse.      We ordered a full stroke work up.  His 2-D echo showed LVEF 60-65% with grade 1 diastolic dysfunction, and the TEE indicated no evidence of thrombosis or PFO.  He also had a negative carotid Doppler.  His risk stratifications including HGB A1C 5.6 and LDL 55 were done. We consulted the hospital neurology team who indicated that his distal left ICA and M1 occlusions (intracranial) were not amenable for revascularization and medical management recommended. Since he was not on antiplatelet treatment prior to his stroke, Aspirin 81 mg po daily for life time and Plavix 75 mg po daily for 3 months were added to his regimen. Lipitor 10 mg po daily was added to his regimen based on ASCOT-LLA trial that looked at high risk patients with HTN but relatively normal cholesterol levels who did not have CAD.  Patient underwent extensive PT/OT therapy during this hospitalization, and he had clinically improved upon discharge. His slurred speech is much better. His muscle strength especially right lower extremity has improved. He is stable and ready to be discharged home with [facility] PT/OT/ST/SW.  Patient will follow up with [facility] clinic and ID clinic. Appointments set up.   2. DM type II    He reported a history of type 2 diabetes with treatment of her lifestyle change.  His hemoglobin A1c was 5.6 on admission.  Per chart review, the highest hemoglobin A1c was 6.3 in 2008.  Patient should followup with his PCP as outpatient.  3. Essential  Hypertension     Patient reports that he takes lisinopril and hydrochlorothiazide for his blood pressure control.  Above antihypertensive medications were on hold to allow permissive hypertension during this  admission.  His blood pressures were well controlled at 120s/80s without any medical treatment.  All of his antihypertensive medications are discontinued upon discharge.  Patient is instructed to continue followup with his PCP as an outpatient.  4. HIV INFECTION, last CD4 410 and VL less than 20 on 11/30/11.    He reports medical compliance with his ART treatment.  Will continue home regimen.        5. Polysubstance abuse, cocaine and tobacco     Patient has a history of cocaine abuse and tobacco abuse.  His urine showed positive for cocaine on admission. He reports he only uses cocaine occasionally and will not need help to quit completely.  Education done on importance of stopping cocaine abuse.    He will also stop tobacco abuse.  Will not give him nicotine patch due to the risk of vasospasm/stroke extension greater suggested by neurology team.  Patient agrees with the above plan. 6. Depression      Stable, will continue home regimen.  7. Calcium oxalate crystals in urine      Patient has been asymptomatic except for an episode of mild transient hematuria during this admission. And his UA showed Ca Oxalate crystals. Will have him to follow up with Outpatient clinic. He may need urology referral as outpatient should he starts to have symptoms.   Discharge Vitals:  BP 117/81  Pulse 94  Temp(Src) 98.6 F (37 C) (Oral)  Resp 18  Ht 6' (1.829 m)  Wt 178 lb 9.2 oz (81 kg)  BMI 24.22 kg/m2  SpO2 100%  Discharge Labs: No results found for this or any previous visit (from the past 24 hour(s)).  Signed: Shaquera Ansley 12/08/2011, 4:03 PM   Time Spent on Discharge: 45 minutes

## 2011-12-08 NOTE — Progress Notes (Signed)
Stroke Team Progress Note  HISTORY 56 y.o. male admitted to [facility] on 12/03/11 after two days of right arm and leg weakness, as well as difficulty speaking. Patient recalls feeling long periods of heart racing and dizziness x 1 week leading up to presenting symptoms. He felt his equilibrium was off and had a couple falls. Since admission, he has had some improvement in strength and ability to ambulate with right leg, but not much improvement right upper extremity. Patient was not a TPA candidate secondary to delay in arrival. He was admitted for further evaluation and treatment.  SUBJECTIVE Wife at the bedside. Pt up in chair at bedside. No new symptoms reported. Patient and wife agree he needs to stop using cocaine.  OBJECTIVE Most recent Vital Signs: Filed Vitals:   12/07/11 1755 12/07/11 2200 12/08/11 0200 12/08/11 0600  BP: 126/85 118/69 116/68 110/63  Pulse: 72 79 75 99  Temp: 97.4 F (36.3 C) 97.7 F (36.5 C) 98.6 F (37 C) 99.5 F (37.5 C)  TempSrc: Oral     Resp: 20 20 20 20   Height:      Weight:      SpO2: 100% 99% 96% 97%   Intake/Output from previous day: 05/20 0701 - 05/21 0700 In: -  Out: 650 [Urine:650]  IV Fluid Intake:     . sodium chloride     MEDICATIONS    . aspirin EC  81 mg Oral Daily  . atorvastatin  10 mg Oral q1800  . enoxaparin  40 mg Subcutaneous Q24H  . lamiVUDine-zidovudine  1 tablet Oral BID  . lopinavir-ritonavir  2 tablet Oral BID  . mirtazapine  45 mg Oral QHS  . QUEtiapine  300 mg Oral QHS  . tenofovir  300 mg Oral Daily   PRN:  ondansetron (ZOFRAN) IV, senna-docusate, zolpidem, DISCONTD: butamben-tetracaine-benzocaine, DISCONTD: fentaNYL, DISCONTD: midazolam  Diet:  General thin liquids Activity:  As tolerated DVT Prophylaxis:  Lovenox 40 mg sq daily   CLINICALLY SIGNIFICANT STUDIES Basic Metabolic Panel:  Lab 12/06/11 4098 12/04/11 0725  NA 138 135  K 3.5 3.8  CL 103 102  CO2 22 18*  GLUCOSE 136* 105*  BUN 14 14  CREATININE 1.07  1.04  CALCIUM 9.1 9.3  MG -- --  PHOS -- --   Liver Function Tests:  Lab 12/03/11 1504  AST 64*  ALT 38  ALKPHOS 77  BILITOT 0.5  PROT 8.3  ALBUMIN 3.7   CBC:  Lab 12/03/11 1504  WBC 6.3  NEUTROABS --  HGB 13.4  HCT 39.4  MCV 97.8  PLT 203   Coagulation:  Lab 12/03/11 1517  LABPROT 14.1  INR 1.07   Urinalysis:  Lab 12/06/11 1547 12/03/11 1638  COLORURINE AMBER* AMBER*  LABSPEC 1.027 1.023  PHURINE 6.5 6.0  GLUCOSEU NEGATIVE NEGATIVE  HGBUR MODERATE* LARGE*  BILIRUBINUR SMALL* SMALL*  KETONESUR 15* NEGATIVE  PROTEINUR 30* 100*  UROBILINOGEN 2.0* 4.0*  NITRITE NEGATIVE NEGATIVE  LEUKOCYTESUR SMALL* NEGATIVE   Lipid Panel    Component Value Date/Time   CHOL 122 12/04/2011 0725   TRIG 129 12/04/2011 0725   HDL 41 12/04/2011 0725   CHOLHDL 3.0 12/04/2011 0725   VLDL 26 12/04/2011 0725   LDLCALC 55 12/04/2011 0725   HgbA1C  Lab Results  Component Value Date   HGBA1C 5.6 12/03/2011   Urine Drug Screen:     Component Value Date/Time   LABOPIA NONE DETECTED 12/03/2011 1638   COCAINSCRNUR POSITIVE* 12/03/2011 1638   LABBENZ NONE DETECTED  12/03/2011 1638   AMPHETMU NONE DETECTED 12/03/2011 1638   THCU NONE DETECTED 12/03/2011 1638   LABBARB NONE DETECTED 12/03/2011 1638    Alcohol Level: No results found for this basename: ETH:2 in the last 168 hours  Thyroid Function Tests:  Lab 12/04/11 1627  TSH 0.866  T4TOTAL --  FREET4 --  T3FREE --  THYROIDAB --   CT of the brain  12/02/2029  Linear hypodensity left frontal cortex and subcortical white matter without significant hemorrhage or mass effect is suspicious for acute infarction. See comments above. Correlate clinically for other potential processes which might appear similar.  Slight progression of atrophy from priors.  MRI of the brain   Acute infarcts in the left parietal white matter and left superior temporal cortex.  Probable subacute infarcts in the deep white matter on the right in a watershed  distribution.  MRA of the brain  Occlusion of the distal left internal carotid artery which ends after supplying the left posterior communicating artery.  Left M1 segment is occluded.  Poor flow or collateral flow in the left middle cerebral artery branches which are not well seen on the study.  2D Echocardiogram  EF 55-60% with no source of embolus.   Carotid Doppler  No internal carotid artery stenosis bilaterally. Vertebrals with antegrade flow bilaterally.   CXR  12/04/2011 No active cardiopulmonary disease.  TEE no SOE, negative bubble study  EKG  normal sinus rhythm.   Therapy Recommendations PT -home health, OT -home health  GENERAL EXAM:  Patient is in no distress  CARDIOVASCULAR:  Regular rate and rhythm, no murmurs, no carotid bruits  NEUROLOGIC:  MENTAL STATUS: awake, alert, language fluent, comprehension intact, naming intact  CRANIAL NERVE: pupils equal and reactive to light, visual fields full to confrontation, extraocular muscles intact, no nystagmus, facial sensation and strength symmetric, uvula midline, shoulder shrug symmetric, tongue midline. MILD DYSARTHRIA.  MOTOR: normal bulk and tone, full strength in the BUE, BLE; EXCEPT DECREASED IN RIGHT UPPER (4/5) AND LOWER EXTREMITY (5-/5). Orbits left over right upper extremity. SENSORY: DECREASED TO ALL MODALITIES IN RUE AND RLE.  COORDINATION: DECR FFM IN RUE.  GAIT/STATION: NOT ASSESSED; BEDREST.  ASSESSMENT [name] is a 56 y.o. male with acute infarcts in the left parietal white matter and left superior temporal cortex as well as subacute infarcts in the right deep white matter in a watershed distribution. Infarcts secondary to terminal L ICA and M1 occlusion, emboli leading to occlusion likely due to arrythmia associated with cocaine abuse. Stroke workup otherwise negative. On no antiplatelets prior to admission. Now on aspirin 81 mg orally every day for secondary stroke prevention. Patient with resultant  right hemiparesis, dysarthria. home health therapy recommended.  -hypertension, stable -Diabetes, A1c 5.6, stable -tobacco abuse -cocaine use, positive on admission -HIV  Agree no need for surgical or IR involvement. Terminal left ICA and M1 occlusions (intracranial) not amenable for revascularization;  medical management recommended.  Hospital day # 5  TREATMENT/PLAN -Continue aspirin 81 mg orally every day for secondary stroke prevention. Add Plavix 75 mg daily for 3 months only, then continue aspirin alone. -patient again counseled to stop cocaine and take medications as prescribed -home health PT & OT -ok for discharge from neuro standpoint -Stroke Service will sign off. Follow up with Dr. Pearlean Brownie in 2 months.  Joaquin Music, ANP-BC, GNP-BC [facility] Pager: [phone] 12/08/2011 8:58 AM  Scribe for Dr. Delia Heady, Stroke Center Medical Director. He has personally reviewed  chart, pertinent data, examined the patient and developed the plan of care. Pager:  [phone]

## 2011-12-08 NOTE — Discharge Instructions (Signed)
STROKE/TIA DISCHARGE INSTRUCTIONS SMOKING Cigarette smoking nearly doubles your risk of having a stroke & is the single most alterable risk factor  If you smoke or have smoked in the last 12 months, you are advised to quit smoking for your health.  Most of the excess cardiovascular risk related to smoking disappears within a year of stopping.  Ask you doctor about anti-smoking medications  [place] Quit Line: 1-800-QUIT NOW  Free Smoking Cessation Classes [phone]  CHOLESTEROL Know your levels; limit fat & cholesterol in your diet  Lipid Panel     Component Value Date/Time   CHOL 122 12/04/2011 0725   TRIG 129 12/04/2011 0725   HDL 41 12/04/2011 0725   CHOLHDL 3.0 12/04/2011 0725   VLDL 26 12/04/2011 0725   LDLCALC 55 12/04/2011 0725      Many patients benefit from treatment even if their cholesterol is at goal.  Goal: Total Cholesterol (CHOL) less than 160  Goal:  Triglycerides (TRIG) less than 150  Goal:  HDL greater than 40  Goal:  LDL (LDLCALC) less than 100   BLOOD PRESSURE American Stroke Association blood pressure target is less that 120/80 mm/Hg  Your discharge blood pressure is:  BP: 117/81 mmHg  Monitor your blood pressure  Limit your salt and alcohol intake  Many individuals will require more than one medication for high blood pressure  DIABETES (A1c is a blood sugar average for last 3 months) Goal HGBA1c is under 7% (HBGA1c is blood sugar average for last 3 months)  Diabetes:     Lab Results  Component Value Date   HGBA1C 5.6 12/03/2011     Your HGBA1c can be lowered with medications, healthy diet, and exercise.  Check your blood sugar as directed by your physician  Call your physician if you experience unexplained or low blood sugars.  PHYSICAL ACTIVITY/REHABILITATION Goal is 30 minutes at least 4 days per week      Activity decreases your risk of heart attack and stroke and makes your heart stronger.  It helps control your weight and blood pressure; helps  you relax and can improve your mood.  Participate in a regular exercise program.  Talk with your doctor about the best form of exercise for you (dancing, walking, swimming, cycling).  DIET/WEIGHT Goal is to maintain a healthy weight  Your discharge diet is: General  liquids Your height is:  Height: 6' (182.9 cm) Your current weight is: Weight: 81 kg (178 lb 9.2 oz) Your Body Mass Index (BMI) is:  BMI (Calculated): 24.3   Following the type of diet specifically designed for you will help prevent another stroke.  Your goal weight range is:    Your goal Body Mass Index (BMI) is 19-24.  Healthy food habits can help reduce 3 risk factors for stroke:  High cholesterol, hypertension, and excess weight.  RESOURCES Stroke/Support Group:  Call [phone]  they meet the 3rd Sunday of the month on the Rehab Unit at [facility], [place] ( no meetings June, July & Aug).  STROKE EDUCATION PROVIDED/REVIEWED AND GIVEN TO PATIENT Stroke warning signs and symptoms How to activate emergency medical system (call 911). Medications prescribed at discharge. Need for follow-up after discharge. Personal risk factors for stroke. Pneumonia vaccine given:    Flu vaccine given:    My questions have been answered, the writing is legible, and I understand these instructions.  I will adhere to these goals & educational materials that have been provided to me after my discharge from the  hospital.   1. Will start you on Aspirin 81 mg po daily for life time. 2. Will start Plavix 75 mg po daily for 3 months and then stop the medication. 3. Follow up with the [facility] outpatient clinic in 2 weeks 4. Will set up home health PT/OT/ST/SW.

## 2011-12-08 NOTE — Progress Notes (Addendum)
S: patient feels better. States that his right side weakness is improving especially his right leg strength. His speech is improving as well. O: General: NAD      Lungs: CTA B/L      Heart: RRR,       Abd: soft. BS x 4      Ext: no edema A/P: - he is stable and ready to go home with home health PT/OT/ST/SW today. - Antiplatelet therapy:Aspirin 81 mg po daily for life time, Plavix 75 mg  Po daily for 3 months  - Lipitor 10 mg po daily added to his regimen based on ASCOT-LLA trial that looked at high risk patients with HTN but relatively normal cholesterol levels who did not have CAD.  - I will discontinue his antihypertensive medications since his BP were normal without any treatment during this admission. - Patient will follow up with [facility] and ID clinic. Appointments set up - Of not, he was noted to have very mild transient hematuria during this admission. And his UA showed Ca Oxalate crystals.     Patient is asymptomatic. Will have him to follow up with Outpatient clinic. He may need urology referral as outpatient should he starts to have symptoms.

## 2011-12-08 NOTE — Progress Notes (Signed)
CSW met with pt and pt's wife re: financial concerns. [name] wife reports, "no concerns I can think of right now." RNCM in process of setting up HH/DME, including HHSW to address any possible financial or other social concerns from pt's home. CSW signing off as no other CSW needs identified.  Dellie Burns, MSW, [place] [phone] (coverage)

## 2011-12-08 NOTE — Progress Notes (Signed)
Speech Language Pathology Treatment Patient Details Name: Barnett Elzey MRN: [id number] DOB: 1955-12-14 Today's Date: 12/08/2011 Time: 4098-1191 SLP Time Calculation (min): 12 min  Assessment / Plan / Recommendation Clinical Impression  Treatment focused on functional communication strategies for dysarthria.  Patient required minimal verbal cues and increased response time for expression of complex thoughts due to decreased speech intelligibility.  Patient sleepy and required repetition cues for comprehension of complex information.  Recommend Outpatient f/u to ensure communicative-cognitive independence.    SLP Plan  Discharge SLP treatment due to (comment) (D/C home today per MD)    Pertinent Vitals/Pain n/a  SLP Goals  SLP Goals SLP Goal #1 - Progress: Not met SLP Goal #2 - Progress: Met  Treatment Treatment focused on: Dysarthria   Myra Rude, M.S.,CCC-SLP Pager [phone] 12/08/2011, 10:16 AM

## 2011-12-09 NOTE — Care Management Note (Signed)
    Page 1 of 2   12/09/2011     9:57:15 AM   CARE MANAGEMENT NOTE 12/09/2011  Patient:  Jacob Joseph   Account Number:  [id number]  Date Initiated:  12/09/2011  Documentation initiated by:  Onnie Boer  Subjective/Objective Assessment:   PT WAS ADMITTED WITH CVA     Action/Plan:   PROGRESSION OF CARE AND DISCHARGE PLANNING   Anticipated DC Date:  12/08/2011   Anticipated DC Plan:  HOME W HOME HEALTH SERVICES  In-house referral  Clinical Social Worker      DC Planning Services  CM consult      Choice offered to / List presented to:  C-3 Spouse   DME arranged  3-N-1  TUB BENCH  WALKER - ROLLING      DME agency  [facility]     HH arranged  HH-2 PT  HH-3 OT  HH-5 SPEECH THERAPY  HH-6 SOCIAL WORKER      HH agency  [facility]   Status of service:  Completed, signed off Medicare Important Message given?   (If response is "NO", the following Medicare IM given date fields will be blank) Date Medicare IM given:   Date Additional Medicare IM given:    Discharge Disposition:  HOME W HOME HEALTH SERVICES  Per UR Regulation:  Reviewed for med. necessity/level of care/duration of stay  If discussed at Long Length of Stay Meetings, dates discussed:    Comments:  12/09/11 Onnie Boer, RN, BSN [phone] PT WAS DC'D TO [place] WITH HH/ DME'S AS ABOVE.

## 2011-12-10 ENCOUNTER — Encounter: Payer: Self-pay | Admitting: *Deleted

## 2011-12-10 NOTE — Progress Notes (Signed)
Patient ID: Jacob Joseph, male   DOB: [birth year], 56 y.o.   MRN: [id number]  [facility] Outpatient General Surgery Department [phone]) to let them know that patient could not make his appointment on 12/07/11 d/t him being hospitalized. They added this to his chart and told me he could call, himself, and reschedule his appointment with them when he was able. Tacey Heap RN

## 2011-12-13 DIAGNOSIS — R82998 Other abnormal findings in urine: Secondary | ICD-10-CM | POA: Diagnosis not present

## 2011-12-17 ENCOUNTER — Telehealth: Payer: Self-pay | Admitting: *Deleted

## 2011-12-17 NOTE — Telephone Encounter (Signed)
Patient called and advised that he wants to reschedule his procedure now that he is out of the hospital. Advised him that per prior note he can schedule it himself and gave him the number [phone] to do so.

## 2011-12-21 ENCOUNTER — Encounter: Payer: Self-pay | Admitting: Internal Medicine

## 2011-12-21 ENCOUNTER — Ambulatory Visit (INDEPENDENT_AMBULATORY_CARE_PROVIDER_SITE_OTHER): Payer: Self-pay | Admitting: Internal Medicine

## 2011-12-21 VITALS — BP 146/89 | HR 86 | Temp 98.4°F | Ht 72.0 in | Wt 199.5 lb

## 2011-12-21 DIAGNOSIS — B20 Human immunodeficiency virus [HIV] disease: Secondary | ICD-10-CM

## 2011-12-21 NOTE — Progress Notes (Signed)
Patient ID: Jacob Joseph, male   DOB: 1955-11-24, 56 y.o.   MRN: [id number]     [facility] for Infectious Disease  Patient Active Problem List  Diagnoses  . TUBERCULOSIS  . HIV INFECTION  . HEPATITIS C  . DIABETES MELLITUS, TYPE II  . ERECTILE DYSFUNCTION  . TOBACCO USER  . Polysubstance abuse  . INSOMNIA, CHRONIC  . DEPRESSION  . ESSENTIAL HYPERTENSION  . DEGENERATIVE JOINT DISEASE  . DEGENERATIVE DISC DISEASE, LUMBAR SPINE  . LEG PAIN, LEFT  . Inguinal hernia recurrent unilateral  . CVA (cerebral infarction)  . Calcium oxalate crystals in urine    Patient's Medications  New Prescriptions   No medications on file  Previous Medications   ASPIRIN EC 81 MG EC TABLET    Take 1 tablet (81 mg total) by mouth daily.   ATORVASTATIN (LIPITOR) 10 MG TABLET    Take 1 tablet (10 mg total) by mouth daily at 6 PM.   CLOPIDOGREL (PLAVIX) 75 MG TABLET    Take 1 tablet (75 mg total) by mouth daily.   LAMIVUDINE-ZIDOVUDINE (COMBIVIR) 150-300 MG PER TABLET    Take 1 tablet by mouth 2 (two) times daily.   LOPINAVIR-RITONAVIR (KALETRA) 200-50 MG PER TABLET    Take 2 tablets by mouth 2 (two) times daily.   MIRTAZAPINE (REMERON) 30 MG TABLET    Take 45 mg by mouth at bedtime.   QUETIAPINE (SEROQUEL) 300 MG TABLET    Take 300 mg by mouth at bedtime.   TENOFOVIR (VIREAD) 300 MG TABLET    Take 300 mg by mouth daily.   ZOLPIDEM (AMBIEN) 5 MG TABLET    Take 5 mg by mouth at bedtime as needed. For insomnia.  Modified Medications   No medications on file  Discontinued Medications   No medications on file    Subjective: Jacob Joseph is in for his hospital followup visit. He was hospitalized with bihemispheric strokes felt possibly due to a cardiac source of emboli. He improved in the hospital and was discharged on aspirin and Plavix which he has been taking. He states that he has had improvement in his speech and right-sided weakness since discharge and continues to work with home PT and OT. He was not  discharged on any antihypertensive medications because his blood pressure had been normal in the hospital without treatment. He denies missing any doses of his antiretroviral medication. He has not smoked any cigarettes or used any cocaine since he was first hospitalized.  Objective: Temp: 98.4 F (36.9 C) (06/03 1359) Temp src: Oral (06/03 1359) BP: 146/89 mmHg (06/03 1359) Pulse Rate: 86  (06/03 1359)  General: He ambulates with the aid of a walker. He has slightly mumbled speech but is certainly understandable Skin: He has dry skin and thickened toenails on both feet Lungs: Clear Cor: Regular S1 and S2 without any murmurs Abdomen: Nontender Neuro: He has some right-sided facial droop and moderate right sided weakness, arm greater than leg. He is able to stand without assistance but is slightly unsteady.  Lab Results HIV 1 RNA Quant (copies/mL)  Date Value  11/30/2011 <20   06/25/2011 <20   01/29/2011 <20      CD4 T Cell Abs (cmm)  Date Value  11/30/2011 410   06/25/2011 290*  01/29/2011 440      Assessment: His HIV infection remains under excellent control. I will continue his current regimen. I've encouraged him to stay off of cigarettes and cocaine and suggested that he establish primary  medical care. He has a followup visit with Dr. Lars Mage tomorrow.  Plan: 1. Continue current antiretroviral medications 2. Followup after lab work in 6 months   Cliffton Asters, MD [facility] for Infectious Disease [facility] [phone] pager   [phone] cell 12/21/2011, 2:32 PM

## 2011-12-23 ENCOUNTER — Encounter: Payer: Self-pay | Admitting: Internal Medicine

## 2011-12-23 ENCOUNTER — Ambulatory Visit (INDEPENDENT_AMBULATORY_CARE_PROVIDER_SITE_OTHER): Payer: Self-pay | Admitting: Internal Medicine

## 2011-12-23 VITALS — BP 129/86 | HR 88 | Temp 98.7°F | Wt 195.7 lb

## 2011-12-23 DIAGNOSIS — I10 Essential (primary) hypertension: Secondary | ICD-10-CM

## 2011-12-23 DIAGNOSIS — M51379 Other intervertebral disc degeneration, lumbosacral region without mention of lumbar back pain or lower extremity pain: Secondary | ICD-10-CM

## 2011-12-23 DIAGNOSIS — E119 Type 2 diabetes mellitus without complications: Secondary | ICD-10-CM

## 2011-12-23 DIAGNOSIS — M5137 Other intervertebral disc degeneration, lumbosacral region: Secondary | ICD-10-CM

## 2011-12-23 DIAGNOSIS — I6350 Cerebral infarction due to unspecified occlusion or stenosis of unspecified cerebral artery: Secondary | ICD-10-CM

## 2011-12-23 DIAGNOSIS — R82998 Other abnormal findings in urine: Secondary | ICD-10-CM

## 2011-12-23 DIAGNOSIS — F1910 Other psychoactive substance abuse, uncomplicated: Secondary | ICD-10-CM

## 2011-12-23 DIAGNOSIS — I639 Cerebral infarction, unspecified: Secondary | ICD-10-CM

## 2011-12-23 DIAGNOSIS — L600 Ingrowing nail: Secondary | ICD-10-CM

## 2011-12-23 HISTORY — DX: Other abnormal findings in urine: R82.998

## 2011-12-23 NOTE — Patient Instructions (Signed)
Ingrown Toenail An ingrown toenail occurs when the sharp edge of your toenail grows into the skin. Causes of ingrown toenails include toenails clipped too far back or poorly fitting shoes. Activities involving sudden stops (basketball, tennis) causing "toe jamming" may lead to an ingrown nail. HOME CARE INSTRUCTIONS   Soak the whole foot in warm soapy water for 20 minutes, 3 times per day.   You may lift the edge of the nail away from the sore skin by wedging a small piece of cotton under the corner of the nail. Be careful not to dig (traumatize) and cause more injury to the area.   Wear shoes that fit well. While the ingrown nail is causing problems, sandals may be beneficial.   Trim your toenails regularly and carefully. Cut your toenails straight across, not in a curve. This will prevent injury to the skin at the corners of the toenail.   Keep your feet clean and dry.   Crutches may be helpful early in treatment if walking is painful.   Antibiotics, if prescribed, should be taken as directed.   Return for a wound check in 2 days or as directed.   Only take over-the-counter or prescription medicines for pain, discomfort, or fever as directed by your caregiver.  SEEK IMMEDIATE MEDICAL CARE IF:   You have a fever.   You have increasing pain, redness, swelling, or heat at the wound site.   Your toe is not better in 7 days.  If conservative treatment is not successful, surgical removal of a portion or all of the nail may be necessary. MAKE SURE YOU:   Understand these instructions.   Will watch your condition.   Will get help right away if you are not doing well or get worse.  Document Released: 07/03/2000 Document Revised: 06/25/2011 Document Reviewed: 06/27/2008 Exit[employer] Patient Information 2012 [employer]. 

## 2011-12-23 NOTE — Assessment & Plan Note (Signed)
Patient needs proper foot care a podiatrist but given that he does not have insurance we are limited In our options. Patient was advised to wash his feet with warm water and soap and use clippers to take care of his toenails. In the meanwhile we'll try to contact the ID department if that would have other resources for him.

## 2011-12-23 NOTE — Assessment & Plan Note (Signed)
Repeat urinalysis today. Patient denies any hematuria at this time. Patient was advised to drink plenty of fluids. He was also told to immediately report if he experiences recurrent hematuria.

## 2011-12-23 NOTE — Assessment & Plan Note (Signed)
Well controlled without medication.  

## 2011-12-23 NOTE — Assessment & Plan Note (Signed)
I will obtain another urine drug screen today.

## 2011-12-23 NOTE — Progress Notes (Signed)
  Subjective:    Patient ID: Jacob Joseph, male    DOB: 05/09/1956, 56 y.o.   MRN: [id number]  HPI Jacob Joseph is a 56 year old man with past medical history most significant for HIV infection, diabetes, polysubstance abuse, hypertension and recent stroke. Patient was discharged from the hospital after an episode of acute stroke and was started on aspirin and Plavix. Patient was also started on Lipitor. This is a followup visit.  #1 patient is compliant with his medications at this time.  #2 his blood pressure is well-controlled without any antihypertensives.  #3 substance abuse: Patient denies using any cocaine  #4 hematuria: Patient was noted to have hematuria during hospitalization but denies any hematuria since discharge.  #5 stroke: Patient is continuing with physical therapy as home health nurses are coming to his house. Patient plans to continue physical therapy at outpatient from Friday. He mentions that his wife will help him getting to the physical therapy. He is compliant with his medications.   #6 diabetes: Controlled with diet  #7 back pain: Patient endorses back pain which is present in the lower back, 5/10 in intensity, no radiation, no fever or chills, no tingling numbness or radiation noted, no changes in bowel or bladder habits.  #8 ingrown toenail: Patient has poorly kept nails and requests assistance.   Denies any chest pain,shortness of breath, abdominal pain, change in bowel or bladder habits. His weakness and gait has improved although he is still using a walker   Review of Systems  All other systems reviewed and are negative.       Objective:   Physical Exam  Constitutional: He is oriented to person, place, and time. He appears well-developed and well-nourished.  HENT:  Head: Normocephalic and atraumatic.  Eyes: Conjunctivae and EOM are normal. Pupils are equal, round, and reactive to light. No scleral icterus.  Neck: Normal range of motion. Neck  supple. No JVD present. No thyromegaly present.  Cardiovascular: Normal rate, regular rhythm, normal heart sounds and intact distal pulses.  Exam reveals no gallop and no friction rub.   No murmur heard. Pulmonary/Chest: Effort normal and breath sounds normal. No respiratory distress. He has no wheezes. He has no rales.  Abdominal: Soft. Bowel sounds are normal. He exhibits no distension and no mass. There is no tenderness. There is no rebound and no guarding.  Musculoskeletal: Normal range of motion. He exhibits no edema and no tenderness.  Lymphadenopathy:    He has no cervical adenopathy.  Neurological: He is alert and oriented to person, place, and time.       Patient has evidence of right-sided weakness residual from his recent stroke  Psychiatric: He has a normal mood and affect. His behavior is normal.          Assessment & Plan:

## 2011-12-23 NOTE — Assessment & Plan Note (Signed)
Patient was advised to avoid NSAIDs and only take Tylenol for his back pain. Patient is also advised to use heating pad 3 times a day.

## 2011-12-23 NOTE — Assessment & Plan Note (Signed)
Patient is recovering well at this time. Plan to continue aspirin and Plavix as per stroke team. He had an appointment with Dr. Pearlean Brownie. Continue treatment for hyperlipidemia. Advice to avoid smoking and cocaine. Continue to monitor blood pressure and diabetes.

## 2011-12-24 LAB — URINALYSIS, ROUTINE W REFLEX MICROSCOPIC
Bilirubin Urine: NEGATIVE
Leukocytes, UA: NEGATIVE
Nitrite: NEGATIVE
Protein, ur: NEGATIVE mg/dL
Specific Gravity, Urine: 1.022 (ref 1.005–1.030)
Urobilinogen, UA: 0.2 mg/dL (ref 0.0–1.0)

## 2011-12-24 LAB — PRESCRIPTION ABUSE MONITORING 15P, URINE
Barbiturate Screen, Urine: NEGATIVE ng/mL
Benzodiazepine Screen, Urine: NEGATIVE ng/mL
Buprenorphine, Urine: NEGATIVE ng/mL
Carisoprodol, Urine: NEGATIVE ng/mL
Opiate Screen, Urine: POSITIVE ng/mL — ABNORMAL HIGH
Propoxyphene: NEGATIVE ng/mL
Tramadol Scrn, Ur: NEGATIVE ng/mL

## 2011-12-24 LAB — MICROALBUMIN / CREATININE URINE RATIO: Creatinine, Urine: 186 mg/dL

## 2011-12-24 LAB — URINALYSIS, MICROSCOPIC ONLY: Crystals: NONE SEEN

## 2011-12-25 ENCOUNTER — Telehealth: Payer: Self-pay | Admitting: *Deleted

## 2011-12-25 DIAGNOSIS — I639 Cerebral infarction, unspecified: Secondary | ICD-10-CM

## 2011-12-25 NOTE — Telephone Encounter (Signed)
Call from [facility], Physical Therapist with [facility]   # [phone] She would like an order for pt to participate in Out patient physical therapy @ [facility].  Fax # [phone] They are d/c home health PT and want him to transition.

## 2011-12-30 LAB — OPIATES/OPIOIDS (LC/MS-MS)
Codeine Urine: NEGATIVE NG/ML
Hydrocodone: 70 NG/ML — ABNORMAL HIGH
Hydromorphone: NEGATIVE NG/ML
Morphine Urine: NEGATIVE NG/ML
Oxycodone, ur: NEGATIVE NG/ML
Oxymorphone: NEGATIVE NG/ML

## 2012-01-07 ENCOUNTER — Ambulatory Visit: Payer: Self-pay | Admitting: Physical Therapy

## 2012-01-11 ENCOUNTER — Ambulatory Visit: Payer: Self-pay | Admitting: Physical Therapy

## 2012-01-12 ENCOUNTER — Ambulatory Visit: Payer: Self-pay | Admitting: Physical Therapy

## 2012-01-16 ENCOUNTER — Emergency Department (HOSPITAL_COMMUNITY): Payer: Medicaid Other | Admitting: Anesthesiology

## 2012-01-16 ENCOUNTER — Encounter (HOSPITAL_COMMUNITY): Payer: Self-pay | Admitting: Anesthesiology

## 2012-01-16 ENCOUNTER — Encounter (HOSPITAL_COMMUNITY)
Admission: EM | Disposition: A | Discharge status: Home / Self Care | Payer: Self-pay | Source: Ambulatory Visit | Attending: General Surgery

## 2012-01-16 ENCOUNTER — Inpatient Hospital Stay (HOSPITAL_COMMUNITY)
Admission: EM | Admit: 2012-01-16 | Discharge: 2012-01-18 | DRG: 352 | Disposition: A | Discharge status: Home / Self Care | Payer: Medicaid Other | Source: Ambulatory Visit | Attending: General Surgery | Admitting: General Surgery

## 2012-01-16 ENCOUNTER — Encounter (HOSPITAL_COMMUNITY): Payer: Self-pay | Admitting: *Deleted

## 2012-01-16 DIAGNOSIS — Z8619 Personal history of other infectious and parasitic diseases: Secondary | ICD-10-CM

## 2012-01-16 DIAGNOSIS — I69998 Other sequelae following unspecified cerebrovascular disease: Secondary | ICD-10-CM

## 2012-01-16 DIAGNOSIS — I10 Essential (primary) hypertension: Secondary | ICD-10-CM | POA: Diagnosis present

## 2012-01-16 DIAGNOSIS — F1410 Cocaine abuse, uncomplicated: Secondary | ICD-10-CM | POA: Diagnosis present

## 2012-01-16 DIAGNOSIS — Z7982 Long term (current) use of aspirin: Secondary | ICD-10-CM

## 2012-01-16 DIAGNOSIS — E119 Type 2 diabetes mellitus without complications: Secondary | ICD-10-CM | POA: Diagnosis present

## 2012-01-16 DIAGNOSIS — G4700 Insomnia, unspecified: Secondary | ICD-10-CM | POA: Diagnosis present

## 2012-01-16 DIAGNOSIS — K4030 Unilateral inguinal hernia, with obstruction, without gangrene, not specified as recurrent: Secondary | ICD-10-CM

## 2012-01-16 DIAGNOSIS — Z8611 Personal history of tuberculosis: Secondary | ICD-10-CM

## 2012-01-16 DIAGNOSIS — Z87891 Personal history of nicotine dependence: Secondary | ICD-10-CM

## 2012-01-16 DIAGNOSIS — R5381 Other malaise: Secondary | ICD-10-CM | POA: Diagnosis present

## 2012-01-16 DIAGNOSIS — F329 Major depressive disorder, single episode, unspecified: Secondary | ICD-10-CM | POA: Diagnosis present

## 2012-01-16 DIAGNOSIS — I69928 Other speech and language deficits following unspecified cerebrovascular disease: Secondary | ICD-10-CM

## 2012-01-16 DIAGNOSIS — F3289 Other specified depressive episodes: Secondary | ICD-10-CM | POA: Diagnosis present

## 2012-01-16 DIAGNOSIS — Z8673 Personal history of transient ischemic attack (TIA), and cerebral infarction without residual deficits: Secondary | ICD-10-CM | POA: Diagnosis present

## 2012-01-16 DIAGNOSIS — Z7902 Long term (current) use of antithrombotics/antiplatelets: Secondary | ICD-10-CM

## 2012-01-16 DIAGNOSIS — B20 Human immunodeficiency virus [HIV] disease: Secondary | ICD-10-CM | POA: Diagnosis present

## 2012-01-16 DIAGNOSIS — Z21 Asymptomatic human immunodeficiency virus [HIV] infection status: Secondary | ICD-10-CM | POA: Diagnosis present

## 2012-01-16 HISTORY — PX: INGUINAL HERNIA REPAIR: SHX194

## 2012-01-16 LAB — CBC WITH DIFFERENTIAL/PLATELET
Basophils Absolute: 0 10*3/uL (ref 0.0–0.1)
Eosinophils Absolute: 0.3 10*3/uL (ref 0.0–0.7)
Eosinophils Relative: 4 % (ref 0–5)
HCT: 29.6 % — ABNORMAL LOW (ref 39.0–52.0)
Lymphocytes Relative: 30 % (ref 12–46)
MCH: 33.2 pg (ref 26.0–34.0)
MCHC: 34.1 g/dL (ref 30.0–36.0)
MCV: 97.4 fL (ref 78.0–100.0)
Monocytes Absolute: 0.8 10*3/uL (ref 0.1–1.0)
Platelets: 222 10*3/uL (ref 150–400)
RDW: 14.2 % (ref 11.5–15.5)

## 2012-01-16 LAB — GLUCOSE, CAPILLARY
Glucose-Capillary: 113 mg/dL — ABNORMAL HIGH (ref 70–99)
Glucose-Capillary: 137 mg/dL — ABNORMAL HIGH (ref 70–99)

## 2012-01-16 LAB — URINALYSIS, ROUTINE W REFLEX MICROSCOPIC
Glucose, UA: NEGATIVE mg/dL
Ketones, ur: NEGATIVE mg/dL
Protein, ur: 30 mg/dL — AB
Urobilinogen, UA: 1 mg/dL (ref 0.0–1.0)

## 2012-01-16 LAB — POCT I-STAT, CHEM 8
BUN: 10 mg/dL (ref 6–23)
Calcium, Ion: 1.22 mmol/L (ref 1.12–1.32)
Hemoglobin: 15.6 g/dL (ref 13.0–17.0)
Sodium: 143 mEq/L (ref 135–145)
TCO2: 17 mmol/L (ref 0–100)

## 2012-01-16 LAB — URINE MICROSCOPIC-ADD ON

## 2012-01-16 SURGERY — REPAIR, HERNIA, INGUINAL, INCARCERATED
Anesthesia: General | Site: Groin | Laterality: Right | Wound class: Clean

## 2012-01-16 MED ORDER — ONDANSETRON HCL 4 MG/2ML IJ SOLN
INTRAMUSCULAR | Status: DC | PRN
  Administered 2012-01-16: 4 mg via INTRAVENOUS

## 2012-01-16 MED ORDER — FENTANYL CITRATE 0.05 MG/ML IJ SOLN
INTRAMUSCULAR | Status: DC | PRN
  Administered 2012-01-16 (×2): 100 ug via INTRAVENOUS
  Administered 2012-01-16 (×3): 50 ug via INTRAVENOUS

## 2012-01-16 MED ORDER — KCL IN DEXTROSE-NACL 20-5-0.45 MEQ/L-%-% IV SOLN
INTRAVENOUS | Status: DC
  Administered 2012-01-16: 1000 mL via INTRAVENOUS
  Filled 2012-01-16 (×4): qty 1000

## 2012-01-16 MED ORDER — ACETAMINOPHEN 10 MG/ML IV SOLN
INTRAVENOUS | Status: DC | PRN
  Administered 2012-01-16: 1000 mg via INTRAVENOUS

## 2012-01-16 MED ORDER — KCL IN DEXTROSE-NACL 20-5-0.45 MEQ/L-%-% IV SOLN
INTRAVENOUS | Status: AC
  Administered 2012-01-16: 1000 mL via INTRAVENOUS
  Filled 2012-01-16: qty 1000

## 2012-01-16 MED ORDER — INSULIN ASPART 100 UNIT/ML ~~LOC~~ SOLN
0.0000 [IU] | Freq: Three times a day (TID) | SUBCUTANEOUS | Status: DC
  Administered 2012-01-17 (×2): 1 [IU] via SUBCUTANEOUS

## 2012-01-16 MED ORDER — PANTOPRAZOLE SODIUM 40 MG IV SOLR
40.0000 mg | Freq: Every day | INTRAVENOUS | Status: DC
  Administered 2012-01-16: 40 mg via INTRAVENOUS
  Filled 2012-01-16 (×2): qty 40

## 2012-01-16 MED ORDER — ONDANSETRON HCL 4 MG/2ML IJ SOLN: 4.0000 mg | Freq: Four times a day (QID) | INTRAMUSCULAR | Status: DC | PRN

## 2012-01-16 MED ORDER — GLYCOPYRROLATE 0.2 MG/ML IJ SOLN
INTRAMUSCULAR | Status: DC | PRN
  Administered 2012-01-16: .6 mg via INTRAVENOUS

## 2012-01-16 MED ORDER — ATORVASTATIN CALCIUM 10 MG PO TABS
10.0000 mg | ORAL_TABLET | Freq: Every day | ORAL | Status: DC
  Administered 2012-01-17: 10 mg via ORAL
  Filled 2012-01-16 (×2): qty 1

## 2012-01-16 MED ORDER — NEOSTIGMINE METHYLSULFATE 1 MG/ML IJ SOLN: INTRAMUSCULAR | Status: DC | PRN

## 2012-01-16 MED ORDER — HYDROMORPHONE HCL PF 1 MG/ML IJ SOLN
1.0000 mg | Freq: Once | INTRAMUSCULAR | Status: AC
  Administered 2012-01-16: 1 mg via INTRAVENOUS
  Filled 2012-01-16: qty 1

## 2012-01-16 MED ORDER — MIRTAZAPINE 45 MG PO TABS
45.0000 mg | ORAL_TABLET | Freq: Every day | ORAL | Status: DC
  Administered 2012-01-16 – 2012-01-17 (×2): 45 mg via ORAL
  Filled 2012-01-16 (×3): qty 1

## 2012-01-16 MED ORDER — DEXTROSE 5 % IV SOLN
INTRAVENOUS | Status: DC | PRN
  Administered 2012-01-16: 18:00:00 via INTRAVENOUS

## 2012-01-16 MED ORDER — MIDAZOLAM HCL 5 MG/5ML IJ SOLN
INTRAMUSCULAR | Status: DC | PRN
  Administered 2012-01-16: 2 mg via INTRAVENOUS

## 2012-01-16 MED ORDER — ACETAMINOPHEN 650 MG RE SUPP: 650.0000 mg | Freq: Four times a day (QID) | RECTAL | Status: DC | PRN

## 2012-01-16 MED ORDER — DROPERIDOL 2.5 MG/ML IJ SOLN
INTRAMUSCULAR | Status: DC | PRN
  Administered 2012-01-16: 0.625 mg via INTRAVENOUS

## 2012-01-16 MED ORDER — LIDOCAINE HCL (CARDIAC) 20 MG/ML IV SOLN
INTRAVENOUS | Status: DC | PRN
  Administered 2012-01-16: 80 mg via INTRAVENOUS
  Administered 2012-01-16: 70 mg via INTRAVENOUS

## 2012-01-16 MED ORDER — SUCCINYLCHOLINE CHLORIDE 20 MG/ML IJ SOLN
INTRAMUSCULAR | Status: DC | PRN
  Administered 2012-01-16: 120 mg via INTRAVENOUS

## 2012-01-16 MED ORDER — HYDROMORPHONE HCL PF 1 MG/ML IJ SOLN
1.0000 mg | INTRAMUSCULAR | Status: DC | PRN
  Administered 2012-01-17: 1 mg via INTRAVENOUS
  Filled 2012-01-16 (×2): qty 1

## 2012-01-16 MED ORDER — LACTATED RINGERS IV SOLN
INTRAVENOUS | Status: DC | PRN
  Administered 2012-01-16 (×2): via INTRAVENOUS

## 2012-01-16 MED ORDER — GLYCOPYRROLATE 0.2 MG/ML IJ SOLN: INTRAMUSCULAR | Status: DC | PRN

## 2012-01-16 MED ORDER — ACETAMINOPHEN 325 MG PO TABS
650.0000 mg | ORAL_TABLET | Freq: Four times a day (QID) | ORAL | Status: DC | PRN
  Administered 2012-01-17: 650 mg via ORAL
  Filled 2012-01-16: qty 2

## 2012-01-16 MED ORDER — OXYCODONE HCL 5 MG PO TABS: 10.0000 mg | ORAL_TABLET | ORAL | Status: DC | PRN

## 2012-01-16 MED ORDER — ZOLPIDEM TARTRATE 5 MG PO TABS
5.0000 mg | ORAL_TABLET | Freq: Every evening | ORAL | Status: DC | PRN
  Administered 2012-01-18: 5 mg via ORAL
  Filled 2012-01-16: qty 1

## 2012-01-16 MED ORDER — SODIUM CHLORIDE 0.9 % IV BOLUS (SEPSIS)
1000.0000 mL | Freq: Once | INTRAVENOUS | Status: AC
  Administered 2012-01-16: 1000 mL via INTRAVENOUS

## 2012-01-16 MED ORDER — 0.9 % SODIUM CHLORIDE (POUR BTL) OPTIME
TOPICAL | Status: DC | PRN
  Administered 2012-01-16: 1000 mL

## 2012-01-16 MED ORDER — METOCLOPRAMIDE HCL 5 MG/ML IJ SOLN
INTRAMUSCULAR | Status: DC | PRN
  Administered 2012-01-16: 10 mg via INTRAVENOUS

## 2012-01-16 MED ORDER — PROPOFOL 10 MG/ML IV EMUL
INTRAVENOUS | Status: DC | PRN
  Administered 2012-01-16: 180 mg via INTRAVENOUS

## 2012-01-16 MED ORDER — LAMIVUDINE-ZIDOVUDINE 150-300 MG PO TABS
1.0000 | ORAL_TABLET | Freq: Two times a day (BID) | ORAL | Status: DC
  Administered 2012-01-16 – 2012-01-18 (×4): 1 via ORAL
  Filled 2012-01-16 (×5): qty 1

## 2012-01-16 MED ORDER — HYDROMORPHONE HCL PF 1 MG/ML IJ SOLN: 0.2500 mg | INTRAMUSCULAR | Status: DC | PRN

## 2012-01-16 MED ORDER — LOPINAVIR-RITONAVIR 200-50 MG PO TABS
2.0000 | ORAL_TABLET | Freq: Two times a day (BID) | ORAL | Status: DC
  Administered 2012-01-17 – 2012-01-18 (×4): 2 via ORAL
  Filled 2012-01-16 (×6): qty 2

## 2012-01-16 MED ORDER — VECURONIUM BROMIDE 10 MG IV SOLR
INTRAVENOUS | Status: DC | PRN
  Administered 2012-01-16 (×2): 1 mg via INTRAVENOUS
  Administered 2012-01-16: 5 mg via INTRAVENOUS

## 2012-01-16 MED ORDER — ONDANSETRON HCL 4 MG/2ML IJ SOLN
4.0000 mg | Freq: Once | INTRAMUSCULAR | Status: AC
  Administered 2012-01-16: 4 mg via INTRAVENOUS
  Filled 2012-01-16: qty 2

## 2012-01-16 MED ORDER — ONDANSETRON HCL 4 MG/2ML IJ SOLN
INTRAMUSCULAR | Status: AC
  Administered 2012-01-16: 13:00:00
  Filled 2012-01-16: qty 2

## 2012-01-16 MED ORDER — NEOSTIGMINE METHYLSULFATE 1 MG/ML IJ SOLN
INTRAMUSCULAR | Status: DC | PRN
  Administered 2012-01-16: 5 mg via INTRAVENOUS

## 2012-01-16 MED ORDER — TENOFOVIR DISOPROXIL FUMARATE 300 MG PO TABS
300.0000 mg | ORAL_TABLET | Freq: Every day | ORAL | Status: DC
  Administered 2012-01-16 – 2012-01-18 (×3): 300 mg via ORAL
  Filled 2012-01-16 (×3): qty 1

## 2012-01-16 MED ORDER — QUETIAPINE FUMARATE 300 MG PO TABS
300.0000 mg | ORAL_TABLET | Freq: Every day | ORAL | Status: DC
  Administered 2012-01-16 – 2012-01-17 (×2): 300 mg via ORAL
  Filled 2012-01-16 (×3): qty 1

## 2012-01-16 MED ORDER — BUPIVACAINE-EPINEPHRINE PF 0.5-1:200000 % IJ SOLN
INTRAMUSCULAR | Status: DC | PRN
  Administered 2012-01-16: 20 mL

## 2012-01-16 MED ORDER — CEFAZOLIN SODIUM-DEXTROSE 2-3 GM-% IV SOLR
2.0000 g | Freq: Once | INTRAVENOUS | Status: AC
  Administered 2012-01-16: 2 g via INTRAVENOUS

## 2012-01-16 SURGICAL SUPPLY — 45 items
ADH SKN CLS APL DERMABOND .7 (GAUZE/BANDAGES/DRESSINGS) ×1
ADH SKN CLS LQ APL DERMABOND (GAUZE/BANDAGES/DRESSINGS) ×1
BLADE SURG ROTATE 9660 (MISCELLANEOUS) ×1 IMPLANT
CANISTER SUCTION 2500CC (MISCELLANEOUS) ×1 IMPLANT
CHLORAPREP W/TINT 26ML (MISCELLANEOUS) ×1 IMPLANT
CLOTH BEACON ORANGE TIMEOUT ST (SAFETY) ×1 IMPLANT
COVER SURGICAL LIGHT HANDLE (MISCELLANEOUS) ×1 IMPLANT
DERMABOND ADHESIVE PROPEN (GAUZE/BANDAGES/DRESSINGS) ×1
DERMABOND ADVANCED (GAUZE/BANDAGES/DRESSINGS) ×1
DERMABOND ADVANCED .7 DNX12 (GAUZE/BANDAGES/DRESSINGS) IMPLANT
DERMABOND ADVANCED .7 DNX6 (GAUZE/BANDAGES/DRESSINGS) IMPLANT
DRAIN PENROSE 1/2X12 LTX STRL (WOUND CARE) ×1 IMPLANT
DRAPE PED LAPAROTOMY (DRAPES) ×2 IMPLANT
DRAPE UTILITY 15X26 W/TAPE STR (DRAPE) ×2 IMPLANT
ELECT REM PT RETURN 9FT ADLT (ELECTROSURGICAL) ×2
ELECTRODE REM PT RTRN 9FT ADLT (ELECTROSURGICAL) IMPLANT
GLOVE BIO SURGEON STRL SZ7 (GLOVE) ×2 IMPLANT
GLOVE BIO SURGEON STRL SZ8 (GLOVE) ×1 IMPLANT
GLOVE BIOGEL PI IND STRL 7.0 (GLOVE) IMPLANT
GLOVE BIOGEL PI IND STRL 7.5 (GLOVE) ×1 IMPLANT
GLOVE BIOGEL PI IND STRL 8 (GLOVE) IMPLANT
GLOVE BIOGEL PI INDICATOR 7.0 (GLOVE) ×2
GLOVE BIOGEL PI INDICATOR 7.5 (GLOVE) ×1
GLOVE BIOGEL PI INDICATOR 8 (GLOVE) ×1
GOWN PREVENTION PLUS XLARGE (GOWN DISPOSABLE) ×2 IMPLANT
GOWN STRL NON-REIN LRG LVL3 (GOWN DISPOSABLE) ×1 IMPLANT
KIT BASIN OR (CUSTOM PROCEDURE TRAY) ×1 IMPLANT
KIT ROOM TURNOVER OR (KITS) ×1 IMPLANT
MESH PROLENE 3X6 (Mesh General) ×1 IMPLANT
NDL HYPO 25GX1X1/2 BEV (NEEDLE) IMPLANT
NEEDLE HYPO 25GX1X1/2 BEV (NEEDLE) ×2 IMPLANT
NS IRRIG 1000ML POUR BTL (IV SOLUTION) ×1 IMPLANT
PACK GENERAL/GYN (CUSTOM PROCEDURE TRAY) ×1 IMPLANT
PAD ARMBOARD 7.5X6 YLW CONV (MISCELLANEOUS) ×1 IMPLANT
SUT MNCRL AB 4-0 PS2 18 (SUTURE) ×4 IMPLANT
SUT PROLENE 0 CT 1 30 (SUTURE) ×8 IMPLANT
SUT VIC AB 2-0 BRD 54 (SUTURE) ×2 IMPLANT
SUT VIC AB 2-0 CT1 27 (SUTURE) ×6
SUT VIC AB 2-0 CT1 TAPERPNT 27 (SUTURE) IMPLANT
SUT VIC AB 3-0 SH 27 (SUTURE) ×2
SUT VIC AB 3-0 SH 27XBRD (SUTURE) IMPLANT
SYR CONTROL 10ML LL (SYRINGE) ×1 IMPLANT
TOWEL OR 17X24 6PK STRL BLUE (TOWEL DISPOSABLE) ×1 IMPLANT
TOWEL OR 17X26 10 PK STRL BLUE (TOWEL DISPOSABLE) ×2 IMPLANT
YANKAUER SUCT BULB TIP NO VENT (SUCTIONS) ×2 IMPLANT

## 2012-01-16 NOTE — Transfer of Care (Signed)
Immediate Anesthesia Transfer of Care Note  Patient: Jacob Joseph  Procedure(s) Performed: Procedure(s) (LRB): HERNIA REPAIR INGUINAL INCARCERATED (Right)  Patient Location: PACU  Anesthesia Type: General  Level of Consciousness: oriented, sedated, patient cooperative and responds to stimulation  Airway & Oxygen Therapy: Patient Spontanous Breathing and Patient connected to face mask oxygen  Post-op Assessment: Report given to PACU RN, Post -op Vital signs reviewed and stable, Patient moving all extremities and Patient moving all extremities X 4  Post vital signs: Reviewed and stable  Complications: No apparent anesthesia complications

## 2012-01-16 NOTE — Anesthesia Procedure Notes (Signed)
Procedure Name: Intubation Date/Time: 01/16/2012 6:01 PM Performed by: Wray Kearns A Pre-anesthesia Checklist: Patient identified, Timeout performed, Emergency Drugs available, Suction available and Patient being monitored Patient Re-evaluated:Patient Re-evaluated prior to inductionOxygen Delivery Method: Circle system utilized Preoxygenation: Pre-oxygenation with 100% oxygen Intubation Type: IV induction, Rapid sequence and Cricoid Pressure applied Laryngoscope Size: Mac and 4 Grade View: Grade I Tube type: Oral Tube size: 8.0 mm Number of attempts: 1 Airway Equipment and Method: Stylet Placement Confirmation: ETT inserted through vocal cords under direct vision Secured at: 24 cm Tube secured with: Tape Dental Injury: Teeth and Oropharynx as per pre-operative assessment

## 2012-01-16 NOTE — H&P (Addendum)
Jacob Joseph is an 56 y.o. male.   Chief Complaint: Right inguinal hernia, nausea and vomiting HPI: Patient has a known history of a right inguinal hernia. He has had it for 6 months.He was recently discharged from the hospital after bihemispheric cerebrovascular accidents leading to some right-sided weakness. He is been taking aspirin and Plavix. He reports he had an appointment to see someone about the hernia at [facility] in 2 days. It usually has been going in and out spontaneously. 2 days ago, it became stuck out. This morning he developed nausea and vomiting and came to the emergency department. He complains of localized pain in the area but denies generalized abdominal pain. He has no other complaints.  Past Medical History  Diagnosis Date  . Hypertension   . HIV (human immunodeficiency virus infection)   . Depression     Past Surgical History  Procedure Date  . Tee without cardioversion 12/07/2011    Procedure: TRANSESOPHAGEAL ECHOCARDIOGRAM (TEE);  Surgeon: [name], MD;  Location: [facility] ENDOSCOPY;  Service: Cardiovascular;  Laterality: N/A;    Family History  Problem Relation Age of Onset  . Hypertension Mother   . Hypertension Father   . Cancer Father    Social History:  reports that he quit smoking about 6 weeks ago. His smoking use included Cigarettes. He has a 20.5 pack-year smoking history. He has never used smokeless tobacco. He reports that he does not drink alcohol or use illicit drugs.  Allergies: No Known Allergies   (Not in a hospital admission)  Results for orders placed during the hospital encounter of 01/16/12 (from the past 48 hour(s))  CBC WITH DIFFERENTIAL     Status: Abnormal   Collection Time   01/16/12  1:19 PM      Component Value Range Comment   WBC 8.4  4.0 - 10.5 K/uL    RBC 3.04 (*) 4.22 - 5.81 MIL/uL    Hemoglobin 10.1 (*) 13.0 - 17.0 g/dL    HCT 45.4 (*) 09.8 - 52.0 %    MCV 97.4  78.0 - 100.0 fL    MCH 33.2  26.0 - 34.0 pg    MCHC 34.1   30.0 - 36.0 g/dL    RDW 11.9  14.7 - 82.9 %    Platelets 222  150 - 400 K/uL    Neutrophils Relative 57  43 - 77 %    Neutro Abs 4.8  1.7 - 7.7 K/uL    Lymphocytes Relative 30  12 - 46 %    Lymphs Abs 2.5  0.7 - 4.0 K/uL    Monocytes Relative 10  3 - 12 %    Monocytes Absolute 0.8  0.1 - 1.0 K/uL    Eosinophils Relative 4  0 - 5 %    Eosinophils Absolute 0.3  0.0 - 0.7 K/uL    Basophils Relative 0  0 - 1 %    Basophils Absolute 0.0  0.0 - 0.1 K/uL   POCT I-STAT, CHEM 8     Status: Abnormal   Collection Time   01/16/12  1:32 PM      Component Value Range Comment   Sodium 143  135 - 145 mEq/L    Potassium 3.1 (*) 3.5 - 5.1 mEq/L    Chloride 110  96 - 112 mEq/L    BUN 10  6 - 23 mg/dL    Creatinine, Ser 5.62  0.50 - 1.35 mg/dL    Glucose, Bld 130 (*) 70 - 99  mg/dL    Calcium, Ion 4.09  8.11 - 1.32 mmol/L    TCO2 17  0 - 100 mmol/L    Hemoglobin 15.6  13.0 - 17.0 g/dL    HCT 91.4  78.2 - 95.6 %   URINALYSIS, ROUTINE W REFLEX MICROSCOPIC     Status: Abnormal   Collection Time   01/16/12  2:04 PM      Component Value Range Comment   Color, Urine YELLOW  YELLOW    APPearance CLEAR  CLEAR    Specific Gravity, Urine 1.025  1.005 - 1.030    pH 5.5  5.0 - 8.0    Glucose, UA NEGATIVE  NEGATIVE mg/dL    Hgb urine dipstick MODERATE (*) NEGATIVE    Bilirubin Urine NEGATIVE  NEGATIVE    Ketones, ur NEGATIVE  NEGATIVE mg/dL    Protein, ur 30 (*) NEGATIVE mg/dL    Urobilinogen, UA 1.0  0.0 - 1.0 mg/dL    Nitrite NEGATIVE  NEGATIVE    Leukocytes, UA NEGATIVE  NEGATIVE   URINE MICROSCOPIC-ADD ON     Status: Normal   Collection Time   01/16/12  2:04 PM      Component Value Range Comment   RBC / HPF 0-2  <3 RBC/hpf    Urine-Other MUCOUS PRESENT      No results found.  Review of Systems  Constitutional: Negative.   HENT: Negative.   Eyes: Negative.   Respiratory: Negative for cough, hemoptysis, sputum production and shortness of breath.        Previous tuberculosis history    Cardiovascular: Negative for chest pain, palpitations and leg swelling.  Gastrointestinal: Positive for nausea and vomiting. Negative for abdominal pain, diarrhea and blood in stool.       Right inguinal hernia, see history of present illness  Genitourinary: Negative.   Musculoskeletal: Negative.   Skin: Negative.   Neurological: Negative.   Endo/Heme/Allergies: Negative.     Blood pressure 165/95, resp. rate 23, SpO2 97.00%. Physical Exam  Constitutional: He is oriented to person, place, and time. He appears well-developed and well-nourished. No distress.  HENT:  Head: Normocephalic.  Mouth/Throat: Oropharynx is clear and moist. No oropharyngeal exudate.  Eyes: EOM are normal. Pupils are equal, round, and reactive to light. No scleral icterus.  Neck: Normal range of motion. No tracheal deviation present.  Cardiovascular: Normal rate, regular rhythm, normal heart sounds and intact distal pulses.   Respiratory: Effort normal and breath sounds normal. No stridor. No respiratory distress. He has no wheezes. He has no rales.  GI: Soft. Bowel sounds are normal. He exhibits no distension. There is no tenderness. There is no rebound and no guarding.       Large right inguinal hernia enters scrotum. It is incarcerated. There is tenderness around the external inguinal canal. I was unable to reduce the hernia. No left inguinal hernia  Genitourinary: Penis normal.       Testes descended bilaterally  Musculoskeletal: Normal range of motion. He exhibits no edema.  Neurological: He is alert and oriented to person, place, and time.       Mild weakness right upper and lower extremity,  speech clear and fluent  Skin: Skin is warm and dry.     Assessment/Plan Patient is HIV positive and recently had bihemispheric cerebrovascular accident. He now has an incarcerated right inguinal hernia. Plan IV antibiotics and we'll taken to the operating room for emergent repair of incarcerated right inguinal hernia  with mesh. Procedure, risks, and  benefits were discussed in detail with the patient including But not limited to the possibility of bowel resection if needed and the possibility of vascular compromise to his testicle. I also discussed increased risk of bleeding due to the patient's blood thinners: aspirin and Plavix.I answered his questions. He is agreeable.  Tyliah Schlereth E 01/16/2012, 3:45 PM

## 2012-01-16 NOTE — ED Provider Notes (Signed)
Medical screening examination/treatment/procedure(s) were conducted as a shared visit with non-physician practitioner(s) and myself.  I personally evaluated the patient during the encounter  Doug Sou, MD 01/16/12 [phone]

## 2012-01-16 NOTE — ED Notes (Signed)
Increased lower abd. Pain, scheduled this Tuesday for consultation on hernia - groin; moderate n/v - green/clear. Negative for stroke scale.

## 2012-01-16 NOTE — ED Provider Notes (Signed)
Patient with swelling and pain and lower abdominal pain right side pain radiates to right scrotum . Had similar symptoms for several months however pain has become unbearable over the past 2 days. Has known inguinal hernia On exam patient is alert awake Glasgow Coma Score 15 . On abdominal exam there is a mass at the right suprapubic area which is tender and not reducible Surgical consult called for incarcerated right inguinal hernia  Doug Sou, MD 01/16/12 1601

## 2012-01-16 NOTE — Preoperative (Signed)
Beta Blockers   Reason not to administer Beta Blockers:Not Applicable 

## 2012-01-16 NOTE — ED Provider Notes (Signed)
History     CSN: [id number]  Arrival date & time 01/16/12  1252   First MD Initiated Contact with Patient 01/16/12 1326      Chief Complaint  Patient presents with  . Nausea  . Emesis    (Consider location/radiation/quality/duration/timing/severity/associated sxs/prior treatment) HPI  56 year old male with past medical history most significant for HIV infection, diabetes, and hx of R inguinal hernia presents complaining of R groin pain and swelling relating to his hernia. Pt is a poor historian.  Per pt, he was diagnosed with inguinal hernia 6 months ago during an ER visit.  His PCP is aware of his hernia and has referred him to "surgery" for consult in 3 days. However, for the past few days pt notice that his hernia is getting increasingly worse with increase swelling and pain, nonreducible.  Endorse nausea and vomiting today.  Has subjective fever.  Denies urinary sxs.  Sts last CD4 count is 400 a few weeks ago, does take his medication.    Past Medical History  Diagnosis Date  . Hypertension   . HIV (human immunodeficiency virus infection)   . Depression     Past Surgical History  Procedure Date  . Tee without cardioversion 12/07/2011    Procedure: TRANSESOPHAGEAL ECHOCARDIOGRAM (TEE);  Surgeon: [name], MD;  Location: [facility] ENDOSCOPY;  Service: Cardiovascular;  Laterality: N/A;    Family History  Problem Relation Age of Onset  . Hypertension Mother   . Hypertension Father   . Cancer Father     History  Substance Use Topics  . Smoking status: Former Smoker -- 0.5 packs/day for 41 years    Types: Cigarettes    Quit date: 12/04/2011  . Smokeless tobacco: Never Used   Comment: quit smioking when he went in the hosp for CVA  . Alcohol Use: No     last time 30 days ago per the pt      Review of Systems  All other systems reviewed and are negative.    Allergies  Review of patient's allergies indicates no known allergies.  Home Medications   Current  Outpatient Rx  Name Route Sig Dispense Refill  . ASPIRIN 81 MG PO TBEC Oral Take 1 tablet (81 mg total) by mouth daily. 30 tablet 11  . ATORVASTATIN CALCIUM 10 MG PO TABS Oral Take 1 tablet (10 mg total) by mouth daily at 6 PM. 30 tablet 11  . CLOPIDOGREL BISULFATE 75 MG PO TABS Oral Take 1 tablet (75 mg total) by mouth daily. 30 tablet 2    Please note: patient will only take Plavix 75 mg p ...  . LAMIVUDINE-ZIDOVUDINE 150-300 MG PO TABS Oral Take 1 tablet by mouth 2 (two) times daily.    [name] Kitchen LOPINAVIR-RITONAVIR 200-50 MG PO TABS Oral Take 2 tablets by mouth 2 (two) times daily.    [name] Kitchen MIRTAZAPINE 30 MG PO TABS Oral Take 45 mg by mouth at bedtime.    [name] Kitchen QUETIAPINE FUMARATE 300 MG PO TABS Oral Take 300 mg by mouth at bedtime.    . TENOFOVIR DISOPROXIL FUMARATE 300 MG PO TABS Oral Take 300 mg by mouth daily.    [name] Kitchen ZOLPIDEM TARTRATE 5 MG PO TABS Oral Take 5 mg by mouth at bedtime as needed. For insomnia.      There were no vitals taken for this visit.  Physical Exam  Nursing note and vitals reviewed. Constitutional: He appears well-developed. No distress.  HENT:       Oral mucosa dry  Eyes: Conjunctivae are normal.  Neck: Normal range of motion. Neck supple.  Cardiovascular: Normal rate and regular rhythm.   Pulmonary/Chest: Effort normal and breath sounds normal.       Decreased breath sound throughout  Abdominal: Soft. There is tenderness in the right lower quadrant. A hernia is present. Hernia confirmed positive in the right inguinal area.         Prominent R inguinal hernia, tender on palpation, nonreducible.  R scrotum moderately swelling and tender on exam.      ED Course  Procedures (including critical care time)   Labs Reviewed  CBC WITH DIFFERENTIAL   No results found.  Results for orders placed during the hospital encounter of 01/16/12  CBC WITH DIFFERENTIAL      Component Value Range   WBC 8.4  4.0 - 10.5 K/uL   RBC 3.04 (*) 4.22 - 5.81 MIL/uL   Hemoglobin 10.1 (*)  13.0 - 17.0 g/dL   HCT 16.1 (*) 09.6 - 04.5 %   MCV 97.4  78.0 - 100.0 fL   MCH 33.2  26.0 - 34.0 pg   MCHC 34.1  30.0 - 36.0 g/dL   RDW 40.9  81.1 - 91.4 %   Platelets 222  150 - 400 K/uL   Neutrophils Relative 57  43 - 77 %   Neutro Abs 4.8  1.7 - 7.7 K/uL   Lymphocytes Relative 30  12 - 46 %   Lymphs Abs 2.5  0.7 - 4.0 K/uL   Monocytes Relative 10  3 - 12 %   Monocytes Absolute 0.8  0.1 - 1.0 K/uL   Eosinophils Relative 4  0 - 5 %   Eosinophils Absolute 0.3  0.0 - 0.7 K/uL   Basophils Relative 0  0 - 1 %   Basophils Absolute 0.0  0.0 - 0.1 K/uL  POCT I-STAT, CHEM 8      Component Value Range   Sodium 143  135 - 145 mEq/L   Potassium 3.1 (*) 3.5 - 5.1 mEq/L   Chloride 110  96 - 112 mEq/L   BUN 10  6 - 23 mg/dL   Creatinine, Ser 7.82  0.50 - 1.35 mg/dL   Glucose, Bld 956 (*) 70 - 99 mg/dL   Calcium, Ion 2.13  0.86 - 1.32 mmol/L   TCO2 17  0 - 100 mmol/L   Hemoglobin 15.6  13.0 - 17.0 g/dL   HCT 57.8  46.9 - 62.9 %    11/30/2011    Range  62mo ago (11/30/11)  30mo ago (06/25/11)  162mo ago (01/29/11)  43yr ago (10/27/10)  62yr ago (08/14/10)  38yr ago (05/08/10)  2yr ago (12/31/09)     CD4 T Cell Abs  400 - 2700 cmm  410   290 (L)   440   580   440   370 (L)   460      CD4 % Helper T Cell  33 - 55 %  21 (L)   21 (L)   22 (L)   18 (L)   18 (L)   19 (L)   21 (L)     Resulting Agency               1. Right inguinal hernia, incarcerated  MDM  R inguinal hernia concerning for incarceration/strangulation.  Pt is HIV positive with CD4 count of 400 several weeks ago.  Pt sts swelling has been ongoing x 2 days, and not reducible. Has been having nausea  and vomiting today.    2:10 PM I have consulted with Dr. Janee Morn from Trauma surgery, who agrees to see patient in ED for further care.    2:43 PM Patient reports feeling much more comfortable after receiving pain medication and antinausea medication. We'll continue to wait for general surgery to evaluate the patient  3:39  PM General surgery surgeon has seen and evaluate the patient. He is unable to reduce the inguinal hernia. Patient will be transferred to the OR for further management.  Fayrene Helper, PA-C 01/16/12 1511  Fayrene Helper, PA-C 01/16/12 1540

## 2012-01-16 NOTE — Consult Note (Signed)
Internal Medicine Teaching Service Consult Note Date: 01/16/2012  Patient name: Jacob Joseph Medical record number: [id number] Date of birth: 05-03-1956 Age: 56 y.o. Gender: male PCP: No primary provider on file. Consulting Physician: Dr. Violeta Gelinas Reason for Consult: Management of medical co-morbidities.  Medical Service: Internal Medicine Teaching Service, [facility]  Attending physician:  Dr. Daiva Eves    1st Contact: Dr. Bosie Clos   Pager: [phone] 2nd Contact: Dr. Allena Katz   Pager: [phone] After 5 pm or weekends: 1st Contact:      Pager: [phone] 2nd Contact:      Pager: [phone]  History of Present Illness: Jacob Joseph is a 56 year old man with a PMH significant for HIV with a last CD 4 count in 5/13 of 410 with an undetectable VL, Diabetes mellitus, hypertension, recent CVA with residual slurred speech and right sided weakness, Hepatitis C, chronic insomnia, and right inguinal hernia who presented to the [facility] ED with two days of pain in his hernia and a 1 day history of nausea, vomiting, and increased pain.  He was found to have an incarcerated hernia and surgery was consulted.  He was taken to surgery for repair of the hernia.  IMTS was consulted to help manage his medical problems during this admission.    Jacob Joseph was seen after surgery on his arrival to the med/surg floor.  He states that he is feeling okay right now just tired from the anesthesia.  He denies current pain, shortness of breath, nausea, or vomiting.   Meds: Current Outpatient Prescriptions on File Prior to Encounter  Medication Sig Dispense Refill  . aspirin EC 81 MG EC tablet Take 1 tablet (81 mg total) by mouth daily.  30 tablet  11  . atorvastatin (LIPITOR) 10 MG tablet Take 1 tablet (10 mg total) by mouth daily at 6 PM.  30 tablet  11  . clopidogrel (PLAVIX) 75 MG tablet Take 1 tablet (75 mg total) by mouth daily.  30 tablet  2  . lamiVUDine-zidovudine (COMBIVIR) 150-300 MG per tablet Take 1 tablet by  mouth 2 (two) times daily.      [name] Kitchen lopinavir-ritonavir (KALETRA) 200-50 MG per tablet Take 2 tablets by mouth 2 (two) times daily.      . mirtazapine (REMERON) 30 MG tablet Take 45 mg by mouth at bedtime.      [name] Kitchen tenofovir (VIREAD) 300 MG tablet Take 300 mg by mouth daily.      [name] Kitchen zolpidem (AMBIEN) 5 MG tablet Take 5 mg by mouth at bedtime as needed. For insomnia.       Allergies: Allergies as of 01/16/2012  . (No Known Allergies)   Past Medical History  Diagnosis Date  . Hypertension   . HIV (human immunodeficiency virus infection)   . Depression    Past Surgical History  Procedure Date  . Tee without cardioversion 12/07/2011    Procedure: TRANSESOPHAGEAL ECHOCARDIOGRAM (TEE);  Surgeon: [name], MD;  Location: [facility] ENDOSCOPY;  Service: Cardiovascular;  Laterality: N/A;   Family History  Problem Relation Age of Onset  . Hypertension Mother   . Hypertension Father   . Cancer Father    History   Social History  . Marital Status: Married    Spouse Name: N/A    Number of Children: N/A  . Years of Education: N/A   Occupational History  . Not on file.   Social History Main Topics  . Smoking status: Former Smoker -- 0.5 packs/day for 41 years  Types: Cigarettes    Quit date: 12/04/2011  . Smokeless tobacco: Never Used   Comment: quit smioking when he went in the hosp for CVA  . Alcohol Use: No     last time 30 days ago per the pt  . Drug Use: No     last time one week prior to being hospitalized Dec 04, 2011  . Sexually Active: Yes -- Male partner(s)     decined condoms   Other Topics Concern  . Not on file   Social History Narrative   Pt lives with wife and grandkid in [place].Has applied for disability and is pending.Worked in [place] before about 3 years ago.   Review of Systems: Constitutional: Denies fever, chills, diaphoresis, appetite change and fatigue.  HEENT: Denies photophobia, eye pain, redness, hearing loss, ear pain, congestion, sore throat,  rhinorrhea, sneezing, mouth sores, trouble swallowing, neck pain, neck stiffness and tinnitus.   Respiratory: Denies SOB, DOE, cough, chest tightness,  and wheezing.   Cardiovascular: Denies chest pain, palpitations and leg swelling.  Gastrointestinal: Denies nausea, vomiting, abdominal pain, diarrhea, constipation, blood in stool and abdominal distention.  Genitourinary: Denies dysuria, urgency, frequency, hematuria, flank pain and difficulty urinating.  Musculoskeletal: Denies myalgias, back pain, joint swelling, arthralgias and gait problem.  Skin: Denies pallor, rash and wound.  Neurological: Denies dizziness, seizures, syncope, weakness, light-headedness, numbness and headaches.  Hematological: Denies adenopathy. Easy bruising, personal or family bleeding history  Psychiatric/Behavioral: Denies suicidal ideation, mood changes, confusion, nervousness, sleep disturbance and agitation  Physical Exam: Blood pressure 129/81, pulse 79, temperature 98.7 F (37.1 C), resp. rate 20, SpO2 96.00%. Constitutional: Vital signs reviewed.  Patient is a well-developed and well-nourished man in no acute distress and cooperative with exam. Alert and oriented x3. He is tired appearing. Head: Normocephalic and atraumatic Ear: TM normal bilaterally Mouth: no erythema or exudates, MMM Eyes: PERRL, EOMI, conjunctivae normal, No scleral icterus.  Neck: Supple, Trachea midline normal ROM, No JVD, mass, thyromegaly, or carotid bruit present.  Cardiovascular: RRR, S1 normal, S2 normal, no MRG, pulses symmetric and intact bilaterally Pulmonary/Chest: CTAB, no wheezes, rales, or rhonchi Abdominal: right inguinal incision noted oozing mildly from the midline portion of the incision.  No surrounding erythema.  Soft. Non-tender, non-distended, bowel sounds are normal, no masses, organomegaly, or guarding present.  GU: no CVA tenderness Musculoskeletal: No joint deformities, erythema, or stiffness, ROM full and no  nontender Hematology: no cervical, inginal, or axillary adenopathy.  Neurological: A&O x3, no facial droop noted.  somnolent following the procedure.  Skin: Warm, dry and intact. No rash, cyanosis, or clubbing.  Psychiatric: Normal mood and affect. speech and behavior is normal.   Lab results: Basic Metabolic Panel:  Basename 01/16/12 1332  NA 143  K 3.1*  CL 110  CO2 --  GLUCOSE 146*  BUN 10  CREATININE 1.20  CALCIUM --  MG --  PHOS --   CBC:  Basename 01/16/12 1332 01/16/12 1319  WBC -- 8.4  NEUTROABS -- 4.8  HGB 15.6 10.1*  HCT 46.0 29.6*  MCV -- 97.4  PLT -- 222   Urinalysis:  Basename 01/16/12 1404  COLORURINE YELLOW  LABSPEC 1.025  PHURINE 5.5  GLUCOSEU NEGATIVE  HGBUR MODERATE*  BILIRUBINUR NEGATIVE  KETONESUR NEGATIVE  PROTEINUR 30*  UROBILINOGEN 1.0  NITRITE NEGATIVE  LEUKOCYTESUR NEGATIVE   Assessment & Plan by Problem: 1. Incarcerated inguinal hernia:  Repaired by surgery today.  They are primary team and will be managing his post op course including  pain medications.   He is cleared for a clear liquid diet and up as he sees fit.    2.  HIV infection: Currently under excellent control on his medications which include Combivir, Kaletra, and tenofovir.  He is not due for a recheck for about 6 months and has no indications for prophylaxis for OI.  We will continue his current medications as directed.    3.  Diabetes mellitus, type II:  His last A1C was 5.6 and he is on no medication for this.  We will monitor him with CBG QACHS and treat with SSI during his stay.    4.  Recent CVA:  He was recently discharged following diagnosis of a likely embolic CVA with occluded intracranial blood vessels.  He was started on ASA 81 mg daily for lifetime and Plavix 75 mg daily for 3 months.  We will hold his medication for one day post op and restart his medication on 01/18/12 or on his discharge whichever is sooner.  5.  Essential hypertension: He has a history of  essential hypertension but during his last hospital stay he was taken off his medications for permissive hypertension and he maintained excellent BP control.  We will monitor and consider restarting medications at discharge if his blood pressure trends up.    6.  Chronic insomnia:  He has struggled with chronic insomnia for many years and is maintained on a regimen of Remeron 45 mg at bedtime, Seroquel 300 mg Qhs and ambien 5 mg PRN.  We will continue these medications during his stay.    7. History of polysubstance abuse: He has a history of tobacco and cocaine abuse and was positive at his last admission for cocaine.  During his last follow up he states that he has not smoked any cigarettes or used any cocaine since that admission.  He is encouraged to maintain cessation.    Signed: Rokia Bosket 01/16/2012, 9:18 PM

## 2012-01-16 NOTE — Op Note (Signed)
01/16/2012  7:56 PM  PATIENT:  Jacob Joseph  56 y.o. male  PRE-OPERATIVE DIAGNOSIS:  incarcerated right inguinal hernia  POST-OPERATIVE DIAGNOSIS:  incarcerated right inguinal hernia  PROCEDURE:  Procedure(s): HERNIA REPAIR INGUINAL INCARCERATED WITH MESH  SURGEON:  Surgeon(s): Liz Malady, MD  PHYSICIAN ASSISTANT:   ASSISTANTS: none   ANESTHESIA:   local and general  EBL:  Total I/O In: 700 [I.V.:700] Out: -   BLOOD ADMINISTERED:none  DRAINS: none   SPECIMEN:  No Specimen  DISPOSITION OF SPECIMEN:  PATHOLOGY  COUNTS:  YES  DICTATION: .Dragon DictationPatient presented to the emergency department with incarcerated right inguinal hernia. Patient has a complicated past medical history including HIV and recent bihemispheric cerebrovascular accidents. He was just discharged 5 weeks ago after hospitalization for the strokes. He has had a known right normal hernia for approximately 6 months. It became incarcerated a couple days ago by history. He developed nausea and vomiting this morning and presented to the emergency room. He is brought for emergent repair of incarcerated right inguinal hernia. Informed consent was obtained. He was identified in the preop holding area. He received intravenous antibiotics. He was brought to operating room and general endotracheal anesthesia was administered by the anesthesia staff. His abdomen and groins were prepped and draped in sterile fashion. Time out procedure was done. After induction of general anesthesia the hernia spontaneously reduced. 1/2% Marcaine with epinephrine was injected along the planned line of incision in the right groin and out towards the anterior superior iliac spine for postoperative pain relief. Right groin incision was made. Subcutaneous tissues were dissected down through Scarpa's fascia using Bovie cautery. External oblique fascia was divided out laterally in the division was continued medially through the external  ring. Superior leaflet of the external oblique fascia was dissected free off the transversalis. Inferior leaflet was dissected down revealing the shelving edge of inguinal ligament. Cord structures were encircled with a Penrose drain. Cord was dissected and this revealed a very large indirect hernia sac. This was gradually dissected free from the cord structures. Some pampiniform plexus veins were ligated Secondary to bleeding complicated by the patient's Plavix. Hernia content was only omentum which was reduced. Hernia sac was high ligated with 2-0 Vicryl and excised and discarded. Inguinal floor was also quite weak but no direct hernia sac was present. hernia repair was then completed With keyhole polypropylene mesh cut to custom size and shape. This was tacked down to Tissues over the pubic tubercle medially with 0 Prolene suture. It was sewn along the shelving edge of inguinal ligament inferiorly with 0 Prolene suture in a running fashion. The mesh was then tacked down to the tissues over the pubic tubercle again medially with 0 Prolene. Interrupted 0 Prolene was used to tack the mesh superiorly to transversalis. The 2 mesh leaflets were rejoined behind the cord and tacked together and to the underlying musculature with interrupted 0 point sutures. Aperture in the mesh admitted the tip of the fifth digit next the cord structures. Cord structures remained viable and nonedematous. Additional local anesthetic was injected. The area was copiously irrigated. Hemostasis was obtained. External oblique fascia was closed with running 2-0 Vicryl suture. Scarpa's fascia was closed with interrupted 3-0 Vicryl suture. Subcutaneous was irrigated and skin closed with running 4-0 Monocryl followed by Dermabond. Sponge needle and as we counts were all correct. Right testicle was relocated in the scrotum at the completion of procedure. There were no apparent complications. He was brought to recovery in stable  condition. I spoke  with the internal medicine teaching service to consult and manage his medical problems postoperatively.  PATIENT DISPOSITION:  PACU - hemodynamically stable.   Delay start of Pharmacological VTE agent (>24hrs) due to surgical blood loss or risk of bleeding:  yes  Violeta Gelinas, MD, MPH, FACS Pager: [phone]  6/29/20137:56 PM

## 2012-01-16 NOTE — Anesthesia Postprocedure Evaluation (Signed)
Anesthesia Post Note  Patient: Jacob Joseph  Procedure(s) Performed: Procedure(s) (LRB): HERNIA REPAIR INGUINAL INCARCERATED (Right)  Anesthesia type: General  Patient location: PACU  Post pain: Pain level controlled and Adequate analgesia  Post assessment: Post-op Vital signs reviewed, Patient's Cardiovascular Status Stable, Respiratory Function Stable, Patent Airway and Pain level controlled  Last Vitals:  Filed Vitals:   01/16/12 2030  BP: 136/79  Pulse: 91  Temp:   Resp: 21    Post vital signs: Reviewed and stable  Level of consciousness: awake, alert  and oriented  Complications: No apparent anesthesia complications

## 2012-01-16 NOTE — Anesthesia Preprocedure Evaluation (Signed)
Anesthesia Evaluation  Patient identified by MRN, date of birth, ID band Patient awake    Reviewed: Allergy & Precautions, H&P , NPO status , Patient's Chart, lab work & pertinent test results  Airway Mallampati: II  Neck ROM: full    Dental   Pulmonary former smoker H/o TB         Cardiovascular hypertension,     Neuro/Psych Depression CVA    GI/Hepatic (+) Hepatitis -, C  Endo/Other  Type 2  Renal/GU      Musculoskeletal   Abdominal   Peds  Hematology  (+) HIV,   Anesthesia Other Findings   Reproductive/Obstetrics                           Anesthesia Physical Anesthesia Plan  ASA: III and Emergent  Anesthesia Plan: General   Post-op Pain Management:    Induction: Intravenous  Airway Management Planned: Oral ETT  Additional Equipment:   Intra-op Plan:   Post-operative Plan: Extubation in OR  Informed Consent: I have reviewed the patients History and Physical, chart, labs and discussed the procedure including the risks, benefits and alternatives for the proposed anesthesia with the patient or authorized representative who has indicated his/her understanding and acceptance.     Plan Discussed with: CRNA and Surgeon  Anesthesia Plan Comments:         Anesthesia Quick Evaluation

## 2012-01-17 LAB — GLUCOSE, CAPILLARY
Glucose-Capillary: 126 mg/dL — ABNORMAL HIGH (ref 70–99)
Glucose-Capillary: 103 mg/dL — ABNORMAL HIGH (ref 70–99)

## 2012-01-17 LAB — CBC
HCT: 33.2 % — ABNORMAL LOW (ref 39.0–52.0)
Hemoglobin: 11.2 g/dL — ABNORMAL LOW (ref 13.0–17.0)
MCHC: 33.7 g/dL (ref 30.0–36.0)
RBC: 3.4 MIL/uL — ABNORMAL LOW (ref 4.22–5.81)

## 2012-01-17 LAB — BASIC METABOLIC PANEL
BUN: 9 mg/dL (ref 6–23)
CO2: 20 mEq/L (ref 19–32)
Chloride: 108 mEq/L (ref 96–112)
GFR calc non Af Amer: 68 mL/min — ABNORMAL LOW (ref >90)
Glucose, Bld: 120 mg/dL — ABNORMAL HIGH (ref 70–99)
Potassium: 3.3 mEq/L — ABNORMAL LOW (ref 3.5–5.1)
Sodium: 139 mEq/L (ref 135–145)

## 2012-01-17 MED ORDER — ASPIRIN EC 81 MG PO TBEC
81.0000 mg | DELAYED_RELEASE_TABLET | Freq: Every day | ORAL | Status: DC
  Administered 2012-01-17 – 2012-01-18 (×2): 81 mg via ORAL
  Filled 2012-01-17 (×2): qty 1

## 2012-01-17 MED ORDER — OXYCODONE HCL 5 MG PO TABS
10.0000 mg | ORAL_TABLET | ORAL | Status: DC | PRN
  Administered 2012-01-17 (×3): 15 mg via ORAL
  Administered 2012-01-18: 10 mg via ORAL
  Filled 2012-01-17: qty 2
  Filled 2012-01-17 (×3): qty 3

## 2012-01-17 MED ORDER — NAPROXEN 500 MG PO TABS
500.0000 mg | ORAL_TABLET | Freq: Two times a day (BID) | ORAL | Status: DC
  Administered 2012-01-17 – 2012-01-18 (×2): 500 mg via ORAL
  Filled 2012-01-17 (×4): qty 1

## 2012-01-17 MED ORDER — SODIUM CHLORIDE 0.9 % IJ SOLN: 3.0000 mL | INTRAMUSCULAR | Status: DC | PRN

## 2012-01-17 MED ORDER — BISACODYL 10 MG RE SUPP: 10.0000 mg | Freq: Two times a day (BID) | RECTAL | Status: DC | PRN

## 2012-01-17 MED ORDER — BIOTENE DRY MOUTH MT LIQD
15.0000 mL | Freq: Two times a day (BID) | OROMUCOSAL | Status: DC
  Administered 2012-01-17 (×2): 15 mL via OROMUCOSAL

## 2012-01-17 MED ORDER — MAGNESIUM OXIDE 400 (241.3 MG) MG PO TABS
800.0000 mg | ORAL_TABLET | Freq: Two times a day (BID) | ORAL | Status: DC
  Administered 2012-01-17 – 2012-01-18 (×3): 800 mg via ORAL
  Filled 2012-01-17 (×4): qty 2

## 2012-01-17 MED ORDER — MAGNESIUM HYDROXIDE 400 MG/5ML PO SUSP: 30.0000 mL | Freq: Two times a day (BID) | ORAL | Status: DC | PRN

## 2012-01-17 MED ORDER — CLOPIDOGREL BISULFATE 75 MG PO TABS
75.0000 mg | ORAL_TABLET | Freq: Every day | ORAL | Status: DC
  Administered 2012-01-18: 75 mg via ORAL
  Filled 2012-01-17: qty 1

## 2012-01-17 MED ORDER — CHLORHEXIDINE GLUCONATE 0.12 % MT SOLN
15.0000 mL | Freq: Two times a day (BID) | OROMUCOSAL | Status: DC
  Administered 2012-01-17 (×2): 15 mL via OROMUCOSAL
  Filled 2012-01-17 (×2): qty 15

## 2012-01-17 MED ORDER — DIPHENHYDRAMINE HCL 50 MG/ML IJ SOLN: 12.5000 mg | Freq: Four times a day (QID) | INTRAMUSCULAR | Status: DC | PRN

## 2012-01-17 MED ORDER — SODIUM CHLORIDE 0.9 % IJ SOLN: 3.0000 mL | Freq: Two times a day (BID) | INTRAMUSCULAR | Status: DC

## 2012-01-17 MED ORDER — HYDROMORPHONE HCL PF 1 MG/ML IJ SOLN
1.0000 mg | Freq: Once | INTRAMUSCULAR | Status: AC
  Administered 2012-01-17: 1 mg via INTRAVENOUS

## 2012-01-17 MED ORDER — POTASSIUM CHLORIDE CRYS ER 20 MEQ PO TBCR
40.0000 meq | EXTENDED_RELEASE_TABLET | Freq: Every day | ORAL | Status: DC
  Administered 2012-01-17 – 2012-01-18 (×2): 40 meq via ORAL
  Filled 2012-01-17 (×2): qty 2

## 2012-01-17 MED ORDER — KCL IN DEXTROSE-NACL 20-5-0.45 MEQ/L-%-% IV SOLN
INTRAVENOUS | Status: DC
  Administered 2012-01-17: 20:00:00 via INTRAVENOUS
  Filled 2012-01-17 (×2): qty 1000

## 2012-01-17 MED ORDER — PANTOPRAZOLE SODIUM 40 MG PO TBEC
40.0000 mg | DELAYED_RELEASE_TABLET | Freq: Every day | ORAL | Status: DC
  Administered 2012-01-17: 40 mg via ORAL
  Filled 2012-01-17: qty 1

## 2012-01-17 MED ORDER — PSYLLIUM 95 % PO PACK
1.0000 | PACK | Freq: Two times a day (BID) | ORAL | Status: DC
  Administered 2012-01-17 – 2012-01-18 (×3): 1 via ORAL
  Filled 2012-01-17 (×4): qty 1

## 2012-01-17 NOTE — Consult Note (Signed)
Internal Medicine Teaching Service Attending Note Date: 01/17/2012  Patient name: Jacob Joseph  Medical record number: [id number]  Date of birth: 01-27-1956   I have seen and evaluated Rico Junker and discussed their care with the Residency Team. Please see Dr Donnelly Stager consult note for full details. Mr Woody was admitted for an incarcerated hernia and is now s/p repair. He states he is in pain around the incision. His last hydromorphone dose was 1mg  and was about 4 hours prior to when I saw him. He states it takes the edge off but wears off. He has not taken any PO although had a tray of clears at bedside. IM was consulted to manage medical issues.  PMHx 1. HIV - CD4 410 5/13 on HAART 2. HTN - meds D/C'd last admission and BP OK since 3. CVA - May 2013, MRI :acute L parietal and superior temporal cortex and subacute infarcts in R deep white matter. MRA : Terminal left ICA and M1 occlusions (intracranial) not amenable for revascularization. Was cocaine +. Felt possibly embolic 2/2 transient arrythmia 2/2 cocaine. On Plavix 3 months and ASA life long. 4. DM - dx by increased A1C. Never on meds. 5. Hep B 6. Chronic insomnia  Meds, social hx, Fam hx, allergies reviewed  PE : Temp 100 (also max), HR 106, RR 18, 147/80, 100 % on [place] Gen : obese, increased RR, appears tired and in pain but answers questions appropriately H tachy, no murmur L increased RR, clear ABD : decreased BS but normoactive, tender B lower quadrants  Labs : WBC 8.5, hgB 11.2, Plts 183, K 3.3   Assessment and Plan: I agree with the formulated Assessment and Plan with the following changes:   1. Incarcerated inguinal hernia - per surgery. Now post-op. Pt had IS at bedside but didn't know how to use it. Appears to be in pain but also a bit sedated. Surgery is managing pain meds. Increased RR and HR likely 2/2 pain but reasonable to recheck pt later in day to ensure not another etiology.  2. HIV - well controlled. Cont  HAART 3. DM II - CBG's and SSI. Currently not taking PO but hopefull intake will increase soon 4. CVA - Resume ASA and Plavix 7/1 per surgery's request 5. HTN - increased 2/2 pain but do not need to treat currently. Follow.  6. Insomnia - cont home meds of ambien, remeron, and seroquel 7. Recent h/o substance use

## 2012-01-17 NOTE — Progress Notes (Signed)
Jacob Joseph [id number] Aug 16, 1955  CARE TEAM:  PCP: No primary provider on file.  Outpatient Care Team: Patient Care Team: Cliffton Asters, MD as PCP - Infectious Diseases (Infectious Diseases)  Inpatient Treatment Team: Treatment Team: Attending Provider: Liz Malady, MD; Rounding Team: Md Ccs, MD; Consulting Physician: Imts - Herrying (Rounding), MD; Registered Nurse: Corena Herter, RN; Technician: Chauncey Reading, NT; Registered Nurse: Laureen Ochs, RN  Subjective:  Sore Tol clears Getting up to chair Sleepy   Objective:  Vital signs:  Filed Vitals:   01/16/12 2057 01/16/12 2100 01/17/12 0208 01/17/12 0653  BP: 129/81 129/81 129/82 147/80  Pulse: 79 79 119 106  Temp: 98.7 F (37.1 C) 98.7 F (37.1 C) 99.5 F (37.5 C) 100 F (37.8 C)  TempSrc:  Oral    Resp: 20 20 19 18   Height: 6' (1.829 m) 6' (1.829 m)    Weight: 202 lb 13.2 oz (92 kg) 202 lb 13.2 oz (92 kg)    SpO2: 96% 96% 100% 100%    Last BM Date: 01/16/12  Intake/Output   Yesterday:  06/29 0701 - 06/30 0700 In: 1800 [I.V.:1800] Out: 700 [Urine:600; Blood:100] This shift:     Bowel function:  Flatus: ?Y  BM: n  Physical Exam:  General: Pt awake, oriented x4 in no acute distress Eyes: PERRL, normal EOM.  Sclera clear.  No icterus Neuro: CN II-XII intact w/o focal sensory/motor deficits. Lymph: No head/neck/groin lymphadenopathy Psych:  No delerium/psychosis/paranoia HENT: Normocephalic, Mucus membranes moist.  No thrush Neck: Supple, No tracheal deviation Chest: No chest wall pain w good excursion CV:  Pulses intact.  Regular rhythm Abdomen: Soft.  Nondistended.  Mildly tender at RLQ dressing only.  No incarcerated hernias. Ext:  SCDs BLE.  No mjr edema.  No cyanosis Skin: No petechiae / purpurae  Results:   Labs: Results for orders placed during the hospital encounter of 01/16/12 (from the past 48 hour(s))  CBC WITH DIFFERENTIAL     Status: Abnormal   Collection Time   01/16/12  1:19 PM      Component Value Range Comment   WBC 8.4  4.0 - 10.5 K/uL    RBC 3.04 (*) 4.22 - 5.81 MIL/uL    Hemoglobin 10.1 (*) 13.0 - 17.0 g/dL    HCT 13.2 (*) 44.0 - 52.0 %    MCV 97.4  78.0 - 100.0 fL    MCH 33.2  26.0 - 34.0 pg    MCHC 34.1  30.0 - 36.0 g/dL    RDW 10.2  72.5 - 36.6 %    Platelets 222  150 - 400 K/uL    Neutrophils Relative 57  43 - 77 %    Neutro Abs 4.8  1.7 - 7.7 K/uL    Lymphocytes Relative 30  12 - 46 %    Lymphs Abs 2.5  0.7 - 4.0 K/uL    Monocytes Relative 10  3 - 12 %    Monocytes Absolute 0.8  0.1 - 1.0 K/uL    Eosinophils Relative 4  0 - 5 %    Eosinophils Absolute 0.3  0.0 - 0.7 K/uL    Basophils Relative 0  0 - 1 %    Basophils Absolute 0.0  0.0 - 0.1 K/uL   POCT I-STAT, CHEM 8     Status: Abnormal   Collection Time   01/16/12  1:32 PM      Component Value Range Comment   Sodium 143  135 -  145 mEq/L    Potassium 3.1 (*) 3.5 - 5.1 mEq/L    Chloride 110  96 - 112 mEq/L    BUN 10  6 - 23 mg/dL    Creatinine, Ser 1.61  0.50 - 1.35 mg/dL    Glucose, Bld 096 (*) 70 - 99 mg/dL    Calcium, Ion 0.45  4.09 - 1.32 mmol/L    TCO2 17  0 - 100 mmol/L    Hemoglobin 15.6  13.0 - 17.0 g/dL    HCT 81.1  91.4 - 78.2 %   URINALYSIS, ROUTINE W REFLEX MICROSCOPIC     Status: Abnormal   Collection Time   01/16/12  2:04 PM      Component Value Range Comment   Color, Urine YELLOW  YELLOW    APPearance CLEAR  CLEAR    Specific Gravity, Urine 1.025  1.005 - 1.030    pH 5.5  5.0 - 8.0    Glucose, UA NEGATIVE  NEGATIVE mg/dL    Hgb urine dipstick MODERATE (*) NEGATIVE    Bilirubin Urine NEGATIVE  NEGATIVE    Ketones, ur NEGATIVE  NEGATIVE mg/dL    Protein, ur 30 (*) NEGATIVE mg/dL    Urobilinogen, UA 1.0  0.0 - 1.0 mg/dL    Nitrite NEGATIVE  NEGATIVE    Leukocytes, UA NEGATIVE  NEGATIVE   URINE MICROSCOPIC-ADD ON     Status: Normal   Collection Time   01/16/12  2:04 PM      Component Value Range Comment   RBC / HPF 0-2  <3 RBC/hpf      Urine-Other MUCOUS PRESENT     GLUCOSE, CAPILLARY     Status: Abnormal   Collection Time   01/16/12  8:16 PM      Component Value Range Comment   Glucose-Capillary 113 (*) 70 - 99 mg/dL   GLUCOSE, CAPILLARY     Status: Abnormal   Collection Time   01/16/12 10:12 PM      Component Value Range Comment   Glucose-Capillary 137 (*) 70 - 99 mg/dL    Comment 1 Documented in Chart      Comment 2 Notify RN     CBC     Status: Abnormal   Collection Time   01/17/12  6:20 AM      Component Value Range Comment   WBC 8.5  4.0 - 10.5 K/uL    RBC 3.40 (*) 4.22 - 5.81 MIL/uL    Hemoglobin 11.2 (*) 13.0 - 17.0 g/dL DELTA CHECK NOTED   HCT 33.2 (*) 39.0 - 52.0 %    MCV 97.6  78.0 - 100.0 fL    MCH 32.9  26.0 - 34.0 pg    MCHC 33.7  30.0 - 36.0 g/dL    RDW 95.6  21.3 - 08.6 %    Platelets 183  150 - 400 K/uL   BASIC METABOLIC PANEL     Status: Abnormal   Collection Time   01/17/12  6:20 AM      Component Value Range Comment   Sodium 139  135 - 145 mEq/L    Potassium 3.3 (*) 3.5 - 5.1 mEq/L    Chloride 108  96 - 112 mEq/L    CO2 20  19 - 32 mEq/L    Glucose, Bld 120 (*) 70 - 99 mg/dL    BUN 9  6 - 23 mg/dL    Creatinine, Ser 5.78  0.50 - 1.35 mg/dL    Calcium 8.5  8.4 - 10.5 mg/dL    GFR calc non Af Amer 68 (*) >90 mL/min    GFR calc Af Amer 78 (*) >90 mL/min   MAGNESIUM     Status: Abnormal   Collection Time   01/17/12  6:20 AM      Component Value Range Comment   Magnesium 1.4 (*) 1.5 - 2.5 mg/dL     Imaging / Studies: No results found.  Medications / Allergies: per chart  Antibiotics: Anti-infectives     Start     Dose/Rate Route Frequency Ordered Stop   01/16/12 2230   tenofovir (VIREAD) tablet 300 mg        300 mg Oral Daily 01/16/12 2102     01/16/12 2200  lamiVUDine-zidovudine (COMBIVIR) 150-300 MG per tablet 1 tablet       1 tablet Oral 2 times daily 01/16/12 2102     01/16/12 2200   lopinavir-ritonavir (KALETRA) 200-50 MG per tablet 2 tablet        2 tablet Oral 2  times daily 01/16/12 2102     01/16/12 1630   ceFAZolin (ANCEF) IVPB 2 g/50 mL premix        2 g 100 mL/hr over 30 Minutes Intravenous  Once 01/16/12 1552 01/16/12 1750          Problem List:  Principal Problem:  *Incarcerated inguinal hernia, unilateral Active Problems:  HIV INFECTION  DIABETES MELLITUS, TYPE II  INSOMNIA, CHRONIC  ESSENTIAL HYPERTENSION  CVA (cerebral infarction)   Assessment  Jacob Joseph  56 y.o. male  1 Day Post-Op  Procedure(s): HERNIA REPAIR INGUINAL INCARCERATED  S/p emergent repair incarcerated RIH  Plan:  -adv diet -bowel regimen -HIV mediciines -glc OK - follow -replace K/Mg -VTE prophylaxis- SCDs, etc -mobilize as tolerated to help recovery  Ardeth Sportsman, M.D., F.A.C.S. Gastrointestinal and Minimally Invasive Surgery [facility] [address] [place], [place] [address] [phone] Main / Paging [phone] Voice Mail   01/17/2012

## 2012-01-18 ENCOUNTER — Emergency Department (HOSPITAL_COMMUNITY)
Admission: EM | Admit: 2012-01-18 | Discharge: 2012-01-18 | Disposition: A | Discharge status: Home / Self Care | Payer: Medicaid Other | Attending: Emergency Medicine | Admitting: Emergency Medicine

## 2012-01-18 ENCOUNTER — Encounter (HOSPITAL_COMMUNITY): Payer: Self-pay | Admitting: General Surgery

## 2012-01-18 DIAGNOSIS — G8918 Other acute postprocedural pain: Secondary | ICD-10-CM | POA: Insufficient documentation

## 2012-01-18 DIAGNOSIS — F3289 Other specified depressive episodes: Secondary | ICD-10-CM | POA: Insufficient documentation

## 2012-01-18 DIAGNOSIS — Z21 Asymptomatic human immunodeficiency virus [HIV] infection status: Secondary | ICD-10-CM | POA: Insufficient documentation

## 2012-01-18 DIAGNOSIS — Z79899 Other long term (current) drug therapy: Secondary | ICD-10-CM | POA: Insufficient documentation

## 2012-01-18 DIAGNOSIS — I10 Essential (primary) hypertension: Secondary | ICD-10-CM | POA: Insufficient documentation

## 2012-01-18 DIAGNOSIS — F329 Major depressive disorder, single episode, unspecified: Secondary | ICD-10-CM | POA: Insufficient documentation

## 2012-01-18 DIAGNOSIS — Z7982 Long term (current) use of aspirin: Secondary | ICD-10-CM | POA: Insufficient documentation

## 2012-01-18 LAB — GLUCOSE, CAPILLARY

## 2012-01-18 LAB — BASIC METABOLIC PANEL
Calcium: 8.5 mg/dL (ref 8.4–10.5)
Creatinine, Ser: 1.03 mg/dL (ref 0.50–1.35)
GFR calc non Af Amer: 81 mL/min — ABNORMAL LOW (ref >90)
Glucose, Bld: 119 mg/dL — ABNORMAL HIGH (ref 70–99)
Sodium: 138 mEq/L (ref 135–145)

## 2012-01-18 MED ORDER — HYDROMORPHONE HCL PF 1 MG/ML IJ SOLN
1.0000 mg | INTRAMUSCULAR | Status: DC | PRN
  Administered 2012-01-18: 1 mg via INTRAVENOUS
  Filled 2012-01-18: qty 1

## 2012-01-18 MED ORDER — SODIUM CHLORIDE 0.9 % IV BOLUS (SEPSIS)
1000.0000 mL | Freq: Once | INTRAVENOUS | Status: DC
Start: 2012-01-18 — End: 2012-01-19

## 2012-01-18 MED ORDER — OXYCODONE-ACETAMINOPHEN 5-325 MG PO TABS: 1.0000 | ORAL_TABLET | ORAL | Status: AC | PRN

## 2012-01-18 MED ORDER — ACETAMINOPHEN 325 MG PO TABS: 650.0000 mg | ORAL_TABLET | Freq: Four times a day (QID) | ORAL | Status: DC | PRN

## 2012-01-18 NOTE — Progress Notes (Signed)
DC HOME WITH FAMILY. VERBALLY UNDERSTOOD DC INSTRUCTIONS. NO QUESTIONS ASKED.

## 2012-01-18 NOTE — ED Notes (Signed)
Patient is sitting up eating, states that he feels better at this time. The patient denies any pain at this time.

## 2012-01-18 NOTE — ED Notes (Signed)
3 attempts made at IV access. Unable to obtained. MD Lynelle Doctor aware and the meds given IM. Labs drawn

## 2012-01-18 NOTE — Progress Notes (Signed)
Should be able to go home today.  Follow up with Dr. Janee Morn in 2 weeks or so.  Marta Lamas. Gae Bon, MD, FACS [phone] [facility]

## 2012-01-18 NOTE — Discharge Instructions (Signed)
Inguinal Hernia, Adult  Care After Refer to this sheet in the next few weeks. These discharge instructions provide you with general information on caring for yourself after you leave the hospital. Your caregiver may also give you specific instructions. Your treatment has been planned according to the most current medical practices available, but unavoidable complications sometimes occur. If you have any problems or questions after discharge, please call your caregiver. HOME CARE INSTRUCTIONS  Put ice on the operative site.   Put ice in a plastic bag.   Place a towel between your skin and the bag.   Leave the ice on for 15 to 20 minutes at a time, 3 to 4 times a day while awake.   Change bandages (dressings) as directed.   Keep the wound dry and clean. The wound may be washed gently with soap and water. Gently blot or dab the wound dry. It is okay to take showers 24 to 48 hours after surgery. Do not take baths, use swimming pools, or use hot tubs for 10 days, or as directed by your caregiver.   Only take over-the-counter or prescription medicines for pain, discomfort, or fever as directed by your caregiver.   Continue your normal diet as directed.   Do not lift anything more than 10 pounds or play contact sports for 3 weeks, or as directed.  SEEK MEDICAL CARE IF:  There is redness, swelling, or increasing pain in the wound.   There is fluid (pus) coming from the wound.   There is drainage from a wound lasting longer than 1 day.   You have an oral temperature above 102 F (38.9 C).   You notice a bad smell coming from the wound or dressing.   The wound breaks open after the stitches (sutures) have been removed.   You notice increasing pain in the shoulders (shoulder strap areas).   You develop dizzy episodes or fainting while standing.   You feel sick to your stomach (nauseous) or throw up (vomit).  SEEK IMMEDIATE MEDICAL CARE IF:  You develop a rash.   You have difficulty  breathing.   You develop a reaction or have side effects to medicines you were given.  MAKE SURE YOU:   Understand these instructions.   Will watch your condition.   Will get help right away if you are not doing well or get worse.  Document Released: 08/06/2006 Document Revised: 06/25/2011 Document Reviewed: 06/05/2009 Exit[employer] Patient Information 2012 [employer].   

## 2012-01-18 NOTE — Progress Notes (Signed)
2 Days Post-Op  Subjective:  Still rates his pain as 7/10 this am, outwardly does not appear to be in any acute distress, HR in 70's when checked, appears sleepy.  Objective: Vital signs in last 24 hours: Temp:  [98 F (36.7 C)-100.4 F (38 C)] 99 F (37.2 C) (07/01 0510) Pulse Rate:  [91-111] 91  (07/01 0510) Resp:  [18-20] 18  (07/01 0510) BP: (124-159)/(77-92) 125/77 mmHg (07/01 0510) SpO2:  [97 %-100 %] 98 % (07/01 0510) Last BM Date: 01/16/12  Intake/Output from previous day: 06/30 0701 - 07/01 0700 In: 2926 [P.O.:1500; I.V.:1426] Out: 1425 [Urine:1425] Intake/Output this shift:    General appearance: cooperative and no distress, somulent, but is easily arousable for exam and questioning. Surgical incision: Dressing is C/D/I, incision appears to be well approximated and healing well. No drainage from wound. Site is tender to palpation, no swelling.   Lab Results:   Basename 01/17/12 0620 01/16/12 1332 01/16/12 1319  WBC 8.5 -- 8.4  HGB 11.2* 15.6 --  HCT 33.2* 46.0 --  PLT 183 -- 222   BMET  Basename 01/17/12 0620 01/16/12 1332  NA 139 143  K 3.3* 3.1*  CL 108 110  CO2 20 --  GLUCOSE 120* 146*  BUN 9 10  CREATININE 1.19 1.20  CALCIUM 8.5 --   PT/INR No results found for this basename: LABPROT:2,INR:2 in the last 72 hours ABG No results found for this basename: PHART:2,PCO2:2,PO2:2,HCO3:2 in the last 72 hours  Studies/Results: No results found.  Anti-infectives: Anti-infectives     Start     Dose/Rate Route Frequency Ordered Stop   01/16/12 2230   tenofovir (VIREAD) tablet 300 mg        300 mg Oral Daily 01/16/12 2102     01/16/12 2200   lamiVUDine-zidovudine (COMBIVIR) 150-300 MG per tablet 1 tablet        1 tablet Oral 2 times daily 01/16/12 2102     01/16/12 2200   lopinavir-ritonavir (KALETRA) 200-50 MG per tablet 2 tablet        2 tablet Oral 2 times daily 01/16/12 2102     01/16/12 1630   ceFAZolin (ANCEF) IVPB 2 g/50 mL premix        2  g 100 mL/hr over 30 Minutes Intravenous  Once 01/16/12 1552 01/16/12 1750          Assessment/Plan: s/p Procedure(s) (LRB): HERNIA REPAIR INGUINAL INCARCERATED (Right) Probable discharge today, with follow-up our office in 2-3 weeks.  LOS: 2 days    Tylesha Gibeault 01/18/2012

## 2012-01-18 NOTE — Care Management Note (Signed)
  Page 1 of 1   01/18/2012     10:06:07 AM   CARE MANAGEMENT NOTE 01/18/2012  Patient:  [facility]   Account Number:  [id number]  Date Initiated:  01/18/2012  Documentation initiated by:  Ronny Flurry  Subjective/Objective Assessment:   s/p HERNIA REPAIR INGUINAL INCARCERATED WITH MESH     Action/Plan:   042   Anticipated DC Date:  01/18/2012   Anticipated DC Plan:  HOME/SELF CARE         Choice offered to / List presented to:             Status of service:  In process, will continue to follow Medicare Important Message given?   (If response is "NO", the following Medicare IM given date fields will be blank) Date Medicare IM given:   Date Additional Medicare IM given:    Discharge Disposition:    Per UR Regulation:  Reviewed for med. necessity/level of care/duration of stay  If discussed at Long Length of Stay Meetings, dates discussed:    Comments:

## 2012-01-18 NOTE — ED Notes (Signed)
[id number]<AV> Expected date:<BR> Expected time: 5:27 PM<BR> Means of arrival:Ambulance<BR> Comments:<BR> M50- Groin pain s/p hernia surgery yesterday

## 2012-01-18 NOTE — Progress Notes (Cosign Needed)
Subjective: No acute events, doing very well. Objective: Vital signs in last 24 hours: Filed Vitals:   01/17/12 1855 01/17/12 2223 01/18/12 0229 01/18/12 0510  BP:  140/78 124/79 125/77  Pulse:  100 94 91  Temp: 99.8 F (37.7 C) 99.8 F (37.7 C) 99 F (37.2 C) 99 F (37.2 C)  TempSrc:  Oral Oral Oral  Resp:  18 18 18   Height:      Weight:      SpO2:  97% 100% 98%   Weight change:   Intake/Output Summary (Last 24 hours) at 01/18/12 1556 Last data filed at 01/18/12 0900  Gross per 24 hour  Intake   1470 ml  Output   1475 ml  Net     -5 ml   Vitals reviewed. General: Sitting in a chair, in NAD HEENT: PERRL, EOMI Cardiac: RRR Pulm: Clear to auscultation bilaterallly Abd: Soft, nontender, nondistended Ext: Warm and well perfused Neuro: Alert and oriented X3, slight slur to speech , appropriate Lab Results: Basic Metabolic Panel:  Lab 01/17/12 1610 01/16/12 1332  NA 139 143  K 3.3* 3.1*  CL 108 110  CO2 20 --  GLUCOSE 120* 146*  BUN 9 10  CREATININE 1.19 1.20  CALCIUM 8.5 --  MG 1.4* --  PHOS -- --   Liver Function Tests: No results found for this basename: AST:2,ALT:2,ALKPHOS:2,BILITOT:2,PROT:2,ALBUMIN:2 in the last 168 hours No results found for this basename: LIPASE:2,AMYLASE:2 in the last 168 hours No results found for this basename: AMMONIA:2 in the last 168 hours CBC:  Lab 01/17/12 0620 01/16/12 1332 01/16/12 1319  WBC 8.5 -- 8.4  NEUTROABS -- -- 4.8  HGB 11.2* 15.6 --  HCT 33.2* 46.0 --  MCV 97.6 -- 97.4  PLT 183 -- 222   Cardiac Enzymes: No results found for this basename: CKTOTAL:3,CKMB:3,CKMBINDEX:3,TROPONINI:3 in the last 168 hours BNP: No results found for this basename: PROBNP:3 in the last 168 hours D-Dimer: No results found for this basename: DDIMER:2 in the last 168 hours CBG:  Lab 01/18/12 0729 01/17/12 2229 01/17/12 1632 01/17/12 1135 01/17/12 0810 01/16/12 2212  GLUCAP 102* 103* 123* 108* 126* 137*   Hemoglobin A1C: No results  found for this basename: HGBA1C in the last 168 hours Fasting Lipid Panel: No results found for this basename: CHOL,HDL,LDLCALC,TRIG,CHOLHDL,LDLDIRECT in the last 960 hours Thyroid Function Tests: No results found for this basename: TSH,T4TOTAL,FREET4,T3FREE,THYROIDAB in the last 168 hours Coagulation: No results found for this basename: LABPROT:4,INR:4 in the last 168 hours Anemia Panel: No results found for this basename: VITAMINB12,FOLATE,FERRITIN,TIBC,IRON,RETICCTPCT in the last 168 hours Urine Drug Screen: Drugs of Abuse     Component Value Date/Time   LABOPIA POS* 12/23/2011 0955   LABOPIA NONE DETECTED 12/03/2011 1638   COCAINSCRNUR POS* 12/23/2011 0955   COCAINSCRNUR POSITIVE* 12/03/2011 1638   LABBENZ NEG 12/23/2011 0955   LABBENZ NONE DETECTED 12/03/2011 1638   AMPHETMU NONE DETECTED 12/03/2011 1638   THCU NONE DETECTED 12/03/2011 1638   LABBARB NEG 12/23/2011 0955   LABBARB NONE DETECTED 12/03/2011 1638    Alcohol Level: No results found for this basename: ETH:2 in the last 168 hours Urinalysis:  Lab 01/16/12 1404  COLORURINE YELLOW  LABSPEC 1.025  PHURINE 5.5  GLUCOSEU NEGATIVE  HGBUR MODERATE*  BILIRUBINUR NEGATIVE  KETONESUR NEGATIVE  PROTEINUR 30*  UROBILINOGEN 1.0  NITRITE NEGATIVE  LEUKOCYTESUR NEGATIVE    Micro Results: No results found for this or any previous visit (from the past 240 hour(s)). Studies/Results: No results found. Medications: I  have reviewed the patient's current medications. Scheduled Meds:   . DISCONTD: antiseptic oral rinse  15 mL Mouth Rinse q12n4p  . DISCONTD: aspirin EC  81 mg Oral Daily  . DISCONTD: atorvastatin  10 mg Oral q1800  . DISCONTD: chlorhexidine  15 mL Mouth Rinse BID  . DISCONTD: clopidogrel  75 mg Oral Daily  . DISCONTD: insulin aspart  0-9 Units Subcutaneous TID WC  . DISCONTD: lamiVUDine-zidovudine  1 tablet Oral BID  . DISCONTD: lopinavir-ritonavir  2 tablet Oral BID  . DISCONTD: magnesium oxide  800 mg Oral BID  .  DISCONTD: mirtazapine  45 mg Oral QHS  . DISCONTD: naproxen  500 mg Oral BID WC  . DISCONTD: pantoprazole  40 mg Oral Q1200  . DISCONTD: potassium chloride  40 mEq Oral Daily  . DISCONTD: psyllium  1 packet Oral BID  . DISCONTD: QUEtiapine  300 mg Oral QHS  . DISCONTD: sodium chloride  3 mL Intravenous Q12H  . DISCONTD: tenofovir  300 mg Oral Daily   Continuous Infusions:   . DISCONTD: dextrose 5 % and 0.45 % NaCl with KCl 20 mEq/L 50 mL/hr at 01/17/12 2023   PRN Meds:.DISCONTD: acetaminophen, DISCONTD: acetaminophen, DISCONTD: bisacodyl, DISCONTD: diphenhydrAMINE, DISCONTD:  HYDROmorphone (DILAUDID) injection, DISCONTD: magnesium hydroxide, DISCONTD: ondansetron, DISCONTD: oxyCODONE, DISCONTD: sodium chloride, DISCONTD: zolpidem Assessment/Plan: 1. Incarcerated inguinal hernia: Repaired by surgery 6/30. They are the primary team and are managing his post op course including pain medications.  2. HIV : Continuing home medications which include Combivir, Kaletra, and tenofovir. He is not due for a recheck for about 6 months and has no indications for prophylaxis for OI.  -Continue current medications as directed.  3. Diabetes mellitus, type II: His last A1C was 5.6, and he is on no medications. We will treat with SSI during his stay.  4. 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 He was started on ASA 81 mg daily for lifetime and Plavix 75 mg daily for 3 months. These were restarted post-op on 01/18/12. - Continue meds at d/c 5. Essential hypertension: He has a history of essential hypertension but during his last hospital stay he was taken off his medications. BP has remained well controlled. 6. Chronic insomnia: He has struggled with chronic insomnia for many years and is maintained on a regimen of Remeron 45 mg at bedtime, Seroquel 300 mg Qhs and ambien 5 mg PRN. He is to continue his home regimen on discharge.    7. History of polysubstance abuse: He has a history of tobacco and cocaine abuse that was positive at his last admission for cocaine. During his last follow up he states that he has not smoked any cigarettes or used any cocaine since that admission. He is encouraged to maintain cessation.      LOS: 2 days   Genelle Gather 01/18/2012, 3:56 PM

## 2012-01-18 NOTE — ED Notes (Signed)
Patient was d/c'd to home today at 1500 and family concerned that the patient was not given an Rx for pain medications.

## 2012-01-18 NOTE — ED Provider Notes (Signed)
History     CSN: [id number]  Arrival date & time 01/18/12  1734   First MD Initiated Contact with Patient 01/18/12 1858      Chief Complaint  Patient presents with  . Testicle Pain    HPI Pt had hernia surgery yesterday.  He was released today at 1500.  Pt was walking to the bathroom today when he started to have more pain. Pt does not have anything to take for pain at home.  No fevers.  No vomiting.  No dysuria.  Pt has been able to eat today.  Pt came to the ED for pain control. Past Medical History  Diagnosis Date  . Hypertension   . HIV (human immunodeficiency virus infection)   . Depression     Past Surgical History  Procedure Date  . Tee without cardioversion 12/07/2011    Procedure: TRANSESOPHAGEAL ECHOCARDIOGRAM (TEE);  Surgeon: [name], MD;  Location: [facility] ENDOSCOPY;  Service: Cardiovascular;  Laterality: N/A;  . Inguinal hernia repair 01/16/2012    Procedure: HERNIA REPAIR INGUINAL INCARCERATED;  Surgeon: Liz Malady, MD;  Location: [facility] OR;  Service: General;  Laterality: Right;    Family History  Problem Relation Age of Onset  . Hypertension Mother   . Hypertension Father   . Cancer Father     History  Substance Use Topics  . Smoking status: Former Smoker -- 0.5 packs/day for 41 years    Types: Cigarettes    Quit date: 12/04/2011  . Smokeless tobacco: Never Used   Comment: quit smioking when he went in the hosp for CVA  . Alcohol Use: No     last time 30 days ago per the pt      Review of Systems  All other systems reviewed and are negative.    Allergies  Review of patient's allergies indicates no known allergies.  Home Medications   Current Outpatient Rx  Name Route Sig Dispense Refill  . ASPIRIN 81 MG PO TBEC Oral Take 1 tablet (81 mg total) by mouth daily. 30 tablet 11  . ATORVASTATIN CALCIUM 10 MG PO TABS Oral Take 1 tablet (10 mg total) by mouth daily at 6 PM. 30 tablet 11  . CLOPIDOGREL BISULFATE 75 MG PO TABS Oral Take 1 tablet  (75 mg total) by mouth daily. 30 tablet 2    Please note: patient will only take Plavix 75 mg p ...  . LAMIVUDINE-ZIDOVUDINE 150-300 MG PO TABS Oral Take 1 tablet by mouth 2 (two) times daily.    [name] Kitchen LISINOPRIL 10 MG PO TABS Oral Take 10 mg by mouth daily.    [name] Kitchen LOPINAVIR-RITONAVIR 200-50 MG PO TABS Oral Take 2 tablets by mouth 2 (two) times daily.    [name] Kitchen MIRTAZAPINE 30 MG PO TABS Oral Take 45 mg by mouth at bedtime.    . OXYCODONE-ACETAMINOPHEN 5-325 MG PO TABS Oral Take 1 tablet by mouth every 4 (four) hours as needed. For pain    . QUETIAPINE FUMARATE 100 MG PO TABS Oral Take 300 mg by mouth at bedtime.    . TENOFOVIR DISOPROXIL FUMARATE 300 MG PO TABS Oral Take 300 mg by mouth daily.      BP 149/78  Pulse 117  Temp 99 F (37.2 C) (Oral)  Resp 22  SpO2 94%  Physical Exam  Nursing note and vitals reviewed. Constitutional: He appears well-developed and well-nourished. No distress.  HENT:  Head: Normocephalic and atraumatic.  Right Ear: External ear normal.  Left Ear: External ear  normal.  Eyes: Conjunctivae are normal. Right eye exhibits no discharge. Left eye exhibits no discharge. No scleral icterus.  Neck: Neck supple. No tracheal deviation present.  Cardiovascular: Normal rate, regular rhythm and intact distal pulses.   Pulmonary/Chest: Effort normal and breath sounds normal. No stridor. No respiratory distress. He has no wheezes. He has no rales.  Abdominal: Soft. Bowel sounds are normal. He exhibits no distension. There is no tenderness. There is no rebound and no guarding.  Genitourinary:       Right inguinal hernia repair scar, no e/e, expected incisional ttp, no inguinal swelling, no testicular ttp  Musculoskeletal: He exhibits no edema and no tenderness.  Neurological: He is alert. He has normal strength. No sensory deficit. Cranial nerve deficit:  no gross defecits noted. He exhibits normal muscle tone. He displays no seizure activity. Coordination normal.  Skin: Skin is warm  and dry. No rash noted.  Psychiatric: He has a normal mood and affect.    ED Course  Procedures (including critical care time)  Labs Reviewed  BASIC METABOLIC PANEL - Abnormal; Notable for the following:    Glucose, Bld 119 (*)     GFR calc non Af Amer 81 (*)     All other components within normal limits  CBC   No results found.    MDM  The patient improved after receiving medications for pain. There's no evidence to suggest infection. He does not have any dehiscence of the surgical scar. Patient states he does not have any medications for pain at home. He was provided pain medications here in emergent apartment I will give him a prescription.        Celene Kras, MD 01/18/12 2149

## 2012-01-18 NOTE — Discharge Summary (Signed)
Physician Discharge Summary  Patient ID: Jacob Joseph MRN: [id number] DOB/AGE: 1956/01/26 56 y.o.  Admit date: 01/16/2012 Discharge date: 01/18/2012  Admission Diagnoses: incarcerated right inguinal hernia   Discharge Diagnoses: Principal Problem:  *Incarcerated inguinal hernia, unilateral Active Problems:  HIV INFECTION  DIABETES MELLITUS, TYPE II  INSOMNIA, CHRONIC  ESSENTIAL HYPERTENSION  CVA (cerebral infarction)   Discharged Condition: stable  Hospital Course: Patient presented to the emergency department with incarcerated right inguinal hernia, he was treated with antibiotics, IVF and taken to the operating room for a repair of his incarcerated hernia. Postoperaitvley he has progressed well, remained afebrile and hemodynamically stable, and is stable for discharge to home with follow-up in our office in two weeks time with Dr. Janee Morn.   Consults: None  Significant Diagnostic Studies: labs and radiology  Treatments: IV hydration, antibiotics, analgesia and surgery  Discharge Exam: Blood pressure 125/77, pulse 91, temperature 99 F (37.2 C), temperature source Oral, resp. rate 18, height 6' (1.829 m), weight 202 lb 13.2 oz (92 kg), SpO2 98.00%. General appearance: alert, cooperative and no distress Surgical incision appears to be healing well; edges are well approximated, no swelling, no evidence of hernia with cough. No drainage.  Disposition: Home/self care with follow up with Dr. Janee Morn in 2 weeks.  Discharge Orders    Future Appointments: Provider: Department: Dept Phone: Center:   01/22/2012 10:30 AM Nestor Lewandowsky, PT [facility] [phone] [facility]   06/07/2012 2:00 PM [facility] [phone] [facility]   06/21/2012 10:00 AM Cliffton Asters, MD [facility] [phone] [facility]     Medication List  As of 01/18/2012 11:14 AM   ASK your doctor about these medications         aspirin 81 MG EC tablet   Take 1 tablet (81 mg total) by mouth  daily.      atorvastatin 10 MG tablet   Commonly known as: LIPITOR   Take 1 tablet (10 mg total) by mouth daily at 6 PM.      clopidogrel 75 MG tablet   Commonly known as: PLAVIX   Take 1 tablet (75 mg total) by mouth daily.      lamiVUDine-zidovudine 150-300 MG per tablet   Commonly known as: COMBIVIR   Take 1 tablet by mouth 2 (two) times daily.      lopinavir-ritonavir 200-50 MG per tablet   Commonly known as: KALETRA   Take 2 tablets by mouth 2 (two) times daily.      mirtazapine 30 MG tablet   Commonly known as: REMERON   Take 45 mg by mouth at bedtime.      oxyCODONE-acetaminophen 5-325 MG per tablet   Commonly known as: PERCOCET   Take 1 tablet by mouth every 4 (four) hours as needed. For pain      QUEtiapine 100 MG tablet   Commonly known as: SEROQUEL   Take 300 mg by mouth at bedtime.      tenofovir 300 MG tablet   Commonly known as: VIREAD   Take 300 mg by mouth daily.      zolpidem 5 MG tablet   Commonly known as: AMBIEN   Take 5 mg by mouth at bedtime as needed. For insomnia.             SignedBlenda Mounts 01/18/2012, 11:14 AM

## 2012-01-22 ENCOUNTER — Encounter (INDEPENDENT_AMBULATORY_CARE_PROVIDER_SITE_OTHER): Payer: PRIVATE HEALTH INSURANCE | Admitting: Surgery

## 2012-01-22 ENCOUNTER — Telehealth (INDEPENDENT_AMBULATORY_CARE_PROVIDER_SITE_OTHER): Payer: Self-pay

## 2012-01-22 ENCOUNTER — Ambulatory Visit: Payer: Self-pay | Attending: Internal Medicine | Admitting: Physical Therapy

## 2012-01-22 DIAGNOSIS — I69998 Other sequelae following unspecified cerebrovascular disease: Secondary | ICD-10-CM | POA: Insufficient documentation

## 2012-01-22 DIAGNOSIS — IMO0001 Reserved for inherently not codable concepts without codable children: Secondary | ICD-10-CM | POA: Insufficient documentation

## 2012-01-22 DIAGNOSIS — M6281 Muscle weakness (generalized): Secondary | ICD-10-CM | POA: Insufficient documentation

## 2012-01-22 NOTE — Telephone Encounter (Signed)
The patient called to schedule his follow up from hernia repair.  I gave him an appt for July 17th.  He states he wasn't given any dc instructions for his wound or any supplies.  I told him they don't usually give out supplies he needs to get them at the drug store.  When I asked if he has an open wound he states he does.  I asked what he has been doing and he states he cleaned it a couple times and kept some gauze over it.  I read his chart and saw no indication that it was open.  I paged Clance Boll PA and she also did not see any documentation of having an open wound.  She states if he does, his incision came open and he needs to be checked.  I called him back and reassured the pt it should not be open and if it was he would see beefy flesh.  He states no it is good and closed.  So he did not need an appointment for today.

## 2012-01-25 NOTE — Addendum Note (Signed)
Addended by: Neomia Dear on: 01/25/2012 04:11 PM   Modules accepted: Orders

## 2012-01-27 NOTE — Telephone Encounter (Signed)
Pt able to see neuro-rehab on 01/22/2012 while order still actionable. Future neuro-rehab appts made at that time for further follow up. Dorie Rank, RN 01/27/2012, 7:44P

## 2012-02-03 ENCOUNTER — Encounter (INDEPENDENT_AMBULATORY_CARE_PROVIDER_SITE_OTHER): Payer: PRIVATE HEALTH INSURANCE | Admitting: General Surgery

## 2012-02-05 ENCOUNTER — Ambulatory Visit: Payer: Medicaid Other | Admitting: Physical Therapy

## 2012-02-08 ENCOUNTER — Ambulatory Visit: Payer: Medicaid Other | Admitting: Physical Therapy

## 2012-02-10 ENCOUNTER — Ambulatory Visit: Payer: Medicaid Other | Admitting: Physical Therapy

## 2012-02-15 ENCOUNTER — Ambulatory Visit: Payer: Self-pay | Admitting: Physical Therapy

## 2012-02-17 ENCOUNTER — Ambulatory Visit: Payer: Medicaid Other | Admitting: Physical Therapy

## 2012-02-19 ENCOUNTER — Other Ambulatory Visit: Payer: Self-pay | Admitting: *Deleted

## 2012-02-19 DIAGNOSIS — I10 Essential (primary) hypertension: Secondary | ICD-10-CM

## 2012-02-19 MED ORDER — LISINOPRIL 10 MG PO TABS: 10.0000 mg | ORAL_TABLET | Freq: Every day | ORAL | Status: DC

## 2012-02-22 ENCOUNTER — Ambulatory Visit: Payer: Self-pay | Attending: Internal Medicine | Admitting: Physical Therapy

## 2012-02-22 DIAGNOSIS — I69998 Other sequelae following unspecified cerebrovascular disease: Secondary | ICD-10-CM | POA: Insufficient documentation

## 2012-02-22 DIAGNOSIS — M6281 Muscle weakness (generalized): Secondary | ICD-10-CM | POA: Insufficient documentation

## 2012-02-22 DIAGNOSIS — IMO0001 Reserved for inherently not codable concepts without codable children: Secondary | ICD-10-CM | POA: Insufficient documentation

## 2012-02-24 ENCOUNTER — Ambulatory Visit: Payer: Medicaid Other | Admitting: Physical Therapy

## 2012-02-24 ENCOUNTER — Ambulatory Visit: Payer: Self-pay | Admitting: Physical Therapy

## 2012-02-24 ENCOUNTER — Encounter: Payer: Medicaid Other | Admitting: Occupational Therapy

## 2012-02-25 ENCOUNTER — Ambulatory Visit: Payer: Medicaid Other | Admitting: Physical Therapy

## 2012-02-26 ENCOUNTER — Ambulatory Visit: Payer: Medicaid Other | Admitting: Physical Therapy

## 2012-02-29 ENCOUNTER — Ambulatory Visit: Payer: Self-pay | Admitting: Physical Therapy

## 2012-02-29 ENCOUNTER — Ambulatory Visit: Payer: Medicaid Other | Admitting: Physical Therapy

## 2012-03-02 ENCOUNTER — Ambulatory Visit: Payer: Self-pay | Admitting: Physical Therapy

## 2012-03-02 ENCOUNTER — Ambulatory Visit: Payer: Self-pay

## 2012-03-02 ENCOUNTER — Ambulatory Visit: Payer: Self-pay | Admitting: *Deleted

## 2012-03-04 ENCOUNTER — Ambulatory Visit: Payer: Medicaid Other | Admitting: Physical Therapy

## 2012-03-07 ENCOUNTER — Ambulatory Visit: Payer: Medicaid Other | Admitting: Physical Therapy

## 2012-03-08 ENCOUNTER — Emergency Department (HOSPITAL_COMMUNITY): Payer: Self-pay

## 2012-03-08 ENCOUNTER — Inpatient Hospital Stay (HOSPITAL_COMMUNITY)
Admission: EM | Admit: 2012-03-08 | Discharge: 2012-03-11 | DRG: 057 | Disposition: A | Discharge status: Home / Self Care | Payer: Self-pay | Attending: Internal Medicine | Admitting: Internal Medicine

## 2012-03-08 ENCOUNTER — Encounter (HOSPITAL_COMMUNITY): Payer: Self-pay | Admitting: Emergency Medicine

## 2012-03-08 DIAGNOSIS — B20 Human immunodeficiency virus [HIV] disease: Secondary | ICD-10-CM | POA: Diagnosis present

## 2012-03-08 DIAGNOSIS — R7401 Elevation of levels of liver transaminase levels: Secondary | ICD-10-CM | POA: Diagnosis present

## 2012-03-08 DIAGNOSIS — D72819 Decreased white blood cell count, unspecified: Secondary | ICD-10-CM | POA: Diagnosis present

## 2012-03-08 DIAGNOSIS — N183 Chronic kidney disease, stage 3 unspecified: Secondary | ICD-10-CM | POA: Diagnosis present

## 2012-03-08 DIAGNOSIS — Z8673 Personal history of transient ischemic attack (TIA), and cerebral infarction without residual deficits: Secondary | ICD-10-CM | POA: Diagnosis present

## 2012-03-08 DIAGNOSIS — D649 Anemia, unspecified: Secondary | ICD-10-CM | POA: Diagnosis present

## 2012-03-08 DIAGNOSIS — I69922 Dysarthria following unspecified cerebrovascular disease: Secondary | ICD-10-CM

## 2012-03-08 DIAGNOSIS — I129 Hypertensive chronic kidney disease with stage 1 through stage 4 chronic kidney disease, or unspecified chronic kidney disease: Secondary | ICD-10-CM | POA: Diagnosis present

## 2012-03-08 DIAGNOSIS — G4700 Insomnia, unspecified: Secondary | ICD-10-CM | POA: Diagnosis present

## 2012-03-08 DIAGNOSIS — I10 Essential (primary) hypertension: Secondary | ICD-10-CM | POA: Diagnosis present

## 2012-03-08 DIAGNOSIS — D709 Neutropenia, unspecified: Secondary | ICD-10-CM | POA: Diagnosis present

## 2012-03-08 DIAGNOSIS — F17200 Nicotine dependence, unspecified, uncomplicated: Secondary | ICD-10-CM | POA: Diagnosis present

## 2012-03-08 DIAGNOSIS — Z21 Asymptomatic human immunodeficiency virus [HIV] infection status: Secondary | ICD-10-CM | POA: Diagnosis present

## 2012-03-08 DIAGNOSIS — N182 Chronic kidney disease, stage 2 (mild): Secondary | ICD-10-CM | POA: Diagnosis present

## 2012-03-08 DIAGNOSIS — M549 Dorsalgia, unspecified: Secondary | ICD-10-CM | POA: Diagnosis present

## 2012-03-08 DIAGNOSIS — R000 Tachycardia, unspecified: Secondary | ICD-10-CM | POA: Diagnosis present

## 2012-03-08 DIAGNOSIS — R531 Weakness: Secondary | ICD-10-CM

## 2012-03-08 DIAGNOSIS — F3289 Other specified depressive episodes: Secondary | ICD-10-CM | POA: Diagnosis present

## 2012-03-08 DIAGNOSIS — R7402 Elevation of levels of lactic acid dehydrogenase (LDH): Secondary | ICD-10-CM | POA: Diagnosis present

## 2012-03-08 DIAGNOSIS — I951 Orthostatic hypotension: Secondary | ICD-10-CM | POA: Diagnosis present

## 2012-03-08 DIAGNOSIS — M545 Low back pain, unspecified: Secondary | ICD-10-CM | POA: Diagnosis present

## 2012-03-08 DIAGNOSIS — F329 Major depressive disorder, single episode, unspecified: Secondary | ICD-10-CM | POA: Diagnosis present

## 2012-03-08 DIAGNOSIS — E119 Type 2 diabetes mellitus without complications: Secondary | ICD-10-CM | POA: Diagnosis present

## 2012-03-08 DIAGNOSIS — F17210 Nicotine dependence, cigarettes, uncomplicated: Secondary | ICD-10-CM | POA: Diagnosis present

## 2012-03-08 DIAGNOSIS — G8929 Other chronic pain: Secondary | ICD-10-CM | POA: Diagnosis present

## 2012-03-08 DIAGNOSIS — I69992 Facial weakness following unspecified cerebrovascular disease: Secondary | ICD-10-CM

## 2012-03-08 DIAGNOSIS — R29898 Other symptoms and signs involving the musculoskeletal system: Secondary | ICD-10-CM | POA: Diagnosis present

## 2012-03-08 DIAGNOSIS — F1910 Other psychoactive substance abuse, uncomplicated: Secondary | ICD-10-CM | POA: Diagnosis present

## 2012-03-08 DIAGNOSIS — I69998 Other sequelae following unspecified cerebrovascular disease: Principal | ICD-10-CM

## 2012-03-08 HISTORY — DX: Cerebral infarction, unspecified: I63.9

## 2012-03-08 LAB — HEPATIC FUNCTION PANEL
ALT: 127 U/L — ABNORMAL HIGH (ref 0–53)
AST: 146 U/L — ABNORMAL HIGH (ref 0–37)
Albumin: 3.2 g/dL — ABNORMAL LOW (ref 3.5–5.2)
Alkaline Phosphatase: 100 U/L (ref 39–117)
Bilirubin, Direct: 0.2 mg/dL (ref 0.0–0.3)
Indirect Bilirubin: 0.3 mg/dL (ref 0.3–0.9)
Total Bilirubin: 0.5 mg/dL (ref 0.3–1.2)
Total Protein: 8 g/dL (ref 6.0–8.3)

## 2012-03-08 LAB — GLUCOSE, CAPILLARY: Glucose-Capillary: 87 mg/dL (ref 70–99)

## 2012-03-08 LAB — CBC WITH DIFFERENTIAL/PLATELET
Basophils Absolute: 0 10*3/uL (ref 0.0–0.1)
Basophils Relative: 0 % (ref 0–1)
Eosinophils Absolute: 0.3 10*3/uL (ref 0.0–0.7)
Eosinophils Relative: 7 % — ABNORMAL HIGH (ref 0–5)
Lymphs Abs: 2 10*3/uL (ref 0.7–4.0)
MCH: 32.6 pg (ref 26.0–34.0)
MCV: 98.2 fL (ref 78.0–100.0)
Neutrophils Relative %: 26 % — ABNORMAL LOW (ref 43–77)
Platelets: 197 10*3/uL (ref 150–400)
RBC: 3.87 MIL/uL — ABNORMAL LOW (ref 4.22–5.81)
RDW: 15.1 % (ref 11.5–15.5)

## 2012-03-08 LAB — URINALYSIS, ROUTINE W REFLEX MICROSCOPIC
Glucose, UA: NEGATIVE mg/dL
Ketones, ur: NEGATIVE mg/dL
Nitrite: NEGATIVE
Protein, ur: 30 mg/dL — AB
Urobilinogen, UA: 2 mg/dL — ABNORMAL HIGH (ref 0.0–1.0)

## 2012-03-08 LAB — BASIC METABOLIC PANEL
BUN: 11 mg/dL (ref 6–23)
CO2: 25 mEq/L (ref 19–32)
Chloride: 106 mEq/L (ref 96–112)
GFR calc non Af Amer: 67 mL/min — ABNORMAL LOW (ref >90)
Glucose, Bld: 98 mg/dL (ref 70–99)
Potassium: 3.9 mEq/L (ref 3.5–5.1)
Sodium: 139 mEq/L (ref 135–145)

## 2012-03-08 LAB — URINE MICROSCOPIC-ADD ON

## 2012-03-08 LAB — CARDIAC PANEL(CRET KIN+CKTOT+MB+TROPI)
CK, MB: 2.7 ng/mL (ref 0.3–4.0)
Relative Index: 0.9 (ref 0.0–2.5)
Total CK: 300 U/L — ABNORMAL HIGH (ref 7–232)
Troponin I: <0.3 ng/mL (ref <0.30)

## 2012-03-08 MED ORDER — OXYCODONE HCL 5 MG PO TABS
5.0000 mg | ORAL_TABLET | ORAL | Status: DC | PRN
  Administered 2012-03-08 – 2012-03-11 (×8): 5 mg via ORAL
  Filled 2012-03-08 (×8): qty 1

## 2012-03-08 MED ORDER — SODIUM CHLORIDE 0.9 % IJ SOLN
3.0000 mL | Freq: Two times a day (BID) | INTRAMUSCULAR | Status: DC
  Administered 2012-03-08 – 2012-03-11 (×6): 3 mL via INTRAVENOUS

## 2012-03-08 MED ORDER — ATORVASTATIN CALCIUM 10 MG PO TABS
10.0000 mg | ORAL_TABLET | Freq: Every day | ORAL | Status: DC
  Administered 2012-03-10: 10 mg via ORAL
  Filled 2012-03-08 (×5): qty 1

## 2012-03-08 MED ORDER — ASPIRIN EC 81 MG PO TBEC
81.0000 mg | DELAYED_RELEASE_TABLET | Freq: Every day | ORAL | Status: DC
  Administered 2012-03-09 – 2012-03-11 (×3): 81 mg via ORAL
  Filled 2012-03-08 (×5): qty 1

## 2012-03-08 MED ORDER — LAMIVUDINE-ZIDOVUDINE 150-300 MG PO TABS
1.0000 | ORAL_TABLET | Freq: Two times a day (BID) | ORAL | Status: DC
  Administered 2012-03-08 – 2012-03-11 (×6): 1 via ORAL
  Filled 2012-03-08 (×9): qty 1

## 2012-03-08 MED ORDER — CLOPIDOGREL BISULFATE 75 MG PO TABS
75.0000 mg | ORAL_TABLET | Freq: Every day | ORAL | Status: DC
  Administered 2012-03-08 – 2012-03-11 (×4): 75 mg via ORAL
  Filled 2012-03-08 (×5): qty 1

## 2012-03-08 MED ORDER — THIAMINE HCL 100 MG/ML IJ SOLN
Freq: Once | INTRAVENOUS | Status: DC
  Filled 2012-03-08: qty 1000

## 2012-03-08 MED ORDER — ONDANSETRON HCL 4 MG/2ML IJ SOLN: 4.0000 mg | Freq: Four times a day (QID) | INTRAMUSCULAR | Status: DC | PRN

## 2012-03-08 MED ORDER — ONDANSETRON HCL 4 MG PO TABS: 4.0000 mg | ORAL_TABLET | Freq: Four times a day (QID) | ORAL | Status: DC | PRN

## 2012-03-08 MED ORDER — INSULIN ASPART 100 UNIT/ML ~~LOC~~ SOLN
0.0000 [IU] | Freq: Three times a day (TID) | SUBCUTANEOUS | Status: DC
  Administered 2012-03-10 – 2012-03-11 (×3): 1 [IU] via SUBCUTANEOUS

## 2012-03-08 MED ORDER — MIRTAZAPINE 45 MG PO TABS
45.0000 mg | ORAL_TABLET | Freq: Every day | ORAL | Status: DC
  Administered 2012-03-08 – 2012-03-09 (×2): 45 mg via ORAL
  Filled 2012-03-08 (×3): qty 1

## 2012-03-08 MED ORDER — LOPINAVIR-RITONAVIR 200-50 MG PO TABS
2.0000 | ORAL_TABLET | Freq: Two times a day (BID) | ORAL | Status: DC
  Administered 2012-03-09 – 2012-03-11 (×6): 2 via ORAL
  Filled 2012-03-08 (×10): qty 2

## 2012-03-08 MED ORDER — ACETAMINOPHEN 650 MG RE SUPP: 650.0000 mg | Freq: Four times a day (QID) | RECTAL | Status: DC | PRN

## 2012-03-08 MED ORDER — ACETAMINOPHEN 325 MG PO TABS: 650.0000 mg | ORAL_TABLET | Freq: Four times a day (QID) | ORAL | Status: DC | PRN

## 2012-03-08 MED ORDER — ENOXAPARIN SODIUM 40 MG/0.4ML ~~LOC~~ SOLN
40.0000 mg | SUBCUTANEOUS | Status: DC
  Administered 2012-03-08 – 2012-03-10 (×3): 40 mg via SUBCUTANEOUS
  Filled 2012-03-08 (×4): qty 0.4

## 2012-03-08 MED ORDER — QUETIAPINE FUMARATE 300 MG PO TABS
300.0000 mg | ORAL_TABLET | Freq: Every day | ORAL | Status: DC
  Administered 2012-03-08 – 2012-03-09 (×2): 300 mg via ORAL
  Filled 2012-03-08 (×3): qty 1

## 2012-03-08 MED ORDER — TENOFOVIR DISOPROXIL FUMARATE 300 MG PO TABS
300.0000 mg | ORAL_TABLET | Freq: Every day | ORAL | Status: DC
  Administered 2012-03-08 – 2012-03-11 (×4): 300 mg via ORAL
  Filled 2012-03-08 (×5): qty 1

## 2012-03-08 NOTE — ED Notes (Signed)
Pt in radiology 

## 2012-03-08 NOTE — ED Notes (Addendum)
Pt from home c/o slurred speech and falls x 2 days; pt with hx of CVA; per family pt also drooling on self; pt c/o lower back pain from fall

## 2012-03-08 NOTE — ED Provider Notes (Signed)
History     CSN: [id number]  Arrival date & time 03/08/12  1218   First MD Initiated Contact with Patient 03/08/12 1235      Chief Complaint  Patient presents with  . Fall  . Aphasia    (Consider location/radiation/quality/duration/timing/severity/associated sxs/prior treatment) Patient is a 56 y.o. male presenting with neurologic complaint.  Neurologic Problem The primary symptoms include headaches and focal weakness. Primary symptoms do not include seizures, speech change, fever, nausea or vomiting. The symptoms began 2 days ago. The symptoms are worsening. The neurological symptoms are focal. Context: at all times.  Episode onset: 1 month ago. Pain scale: moderate.  Region/motion of weakness: RUE and RLE.    Past Medical History  Diagnosis Date  . Hypertension   . HIV (human immunodeficiency virus infection)   . Depression   . Stroke     Past Surgical History  Procedure Date  . Tee without cardioversion 12/07/2011    Procedure: TRANSESOPHAGEAL ECHOCARDIOGRAM (TEE);  Surgeon: [name], MD;  Location: [facility] ENDOSCOPY;  Service: Cardiovascular;  Laterality: N/A;  . Inguinal hernia repair 01/16/2012    Procedure: HERNIA REPAIR INGUINAL INCARCERATED;  Surgeon: Liz Malady, MD;  Location: [facility] OR;  Service: General;  Laterality: Right;    Family History  Problem Relation Age of Onset  . Hypertension Mother   . Hypertension Father   . Cancer Father     History  Substance Use Topics  . Smoking status: Current Everyday Smoker -- 0.5 packs/day for 41 years    Types: Cigarettes    Last Attempt to Quit: 12/04/2011  . Smokeless tobacco: Never Used   Comment: quit smioking when he went in the hosp for CVA  . Alcohol Use: 0.0 oz/week    0 drink(s) per week     last time 30 days ago per the pt      Review of Systems  Constitutional: Negative for fever and chills.  HENT: Negative for neck pain.   Respiratory: Positive for cough and shortness of breath (at his  chronic dyspnea baseline).   Cardiovascular: Negative for chest pain and leg swelling.  Gastrointestinal: Negative for nausea, vomiting, abdominal pain and constipation.  Genitourinary: Negative for dysuria and decreased urine volume.  Musculoskeletal: Positive for back pain.  Neurological: Positive for focal weakness and headaches. Negative for speech change, seizures, syncope, speech difficulty (chronic from his CVA, not worse) and numbness.  All other systems reviewed and are negative.    Allergies  Review of patient's allergies indicates no known allergies.  Home Medications   Current Outpatient Rx  Name Route Sig Dispense Refill  . ASPIRIN 81 MG PO TBEC Oral Take 1 tablet (81 mg total) by mouth daily. 30 tablet 11  . ATORVASTATIN CALCIUM 10 MG PO TABS Oral Take 1 tablet (10 mg total) by mouth daily at 6 PM. 30 tablet 11  . CLOPIDOGREL BISULFATE 75 MG PO TABS Oral Take 1 tablet (75 mg total) by mouth daily. 30 tablet 2    Please note: patient will only take Plavix 75 mg p ...  . LAMIVUDINE-ZIDOVUDINE 150-300 MG PO TABS Oral Take 1 tablet by mouth 2 (two) times daily.    [name] Kitchen LISINOPRIL 10 MG PO TABS Oral Take 1 tablet (10 mg total) by mouth daily. 30 tablet 6  . LOPINAVIR-RITONAVIR 200-50 MG PO TABS Oral Take 2 tablets by mouth 2 (two) times daily.    [name] Kitchen MIRTAZAPINE 30 MG PO TABS Oral Take 45 mg by mouth at  bedtime.    . OXYCODONE-ACETAMINOPHEN 5-325 MG PO TABS Oral Take 1 tablet by mouth every 4 (four) hours as needed. For pain    . QUETIAPINE FUMARATE 100 MG PO TABS Oral Take 300 mg by mouth at bedtime.    . TENOFOVIR DISOPROXIL FUMARATE 300 MG PO TABS Oral Take 300 mg by mouth daily.      BP 153/94  Pulse 76  Temp 98.4 F (36.9 C) (Oral)  Resp 15  SpO2 95%  Physical Exam  Nursing note and vitals reviewed. Constitutional: He is oriented to person, place, and time. He appears well-developed and well-nourished.  HENT:  Head: Normocephalic and atraumatic.  Right Ear: External  ear normal.  Left Ear: External ear normal.  Nose: Nose normal.  Eyes: EOM are normal. Pupils are equal, round, and reactive to light. Right eye exhibits no nystagmus. Left eye exhibits no nystagmus.  Neck: Neck supple.  Cardiovascular: Normal rate, regular rhythm, normal heart sounds and intact distal pulses.   Pulmonary/Chest: Effort normal.  Abdominal: Soft. He exhibits no distension. There is no tenderness.  Musculoskeletal: He exhibits no edema and no tenderness.  Neurological: He is alert and oriented to person, place, and time. He has normal reflexes. No cranial nerve deficit. GCS eye subscore is 4. GCS verbal subscore is 5. GCS motor subscore is 6. He displays no Babinski's sign on the right side. He displays no Babinski's sign on the left side.  Reflex Scores:      Patellar reflexes are 2+ on the right side and 2+ on the left side.      Achilles reflexes are 2+ on the right side and 2+ on the left side.      Shuffling gait noted (normal per family). 4/5 strength in RLE, mildly decreased strength in RUE  Skin: Skin is warm and dry.    ED Course  Procedures (including critical care time)   Labs Reviewed  GLUCOSE, CAPILLARY  CBC WITH DIFFERENTIAL  URINALYSIS, ROUTINE W REFLEX MICROSCOPIC   Dg Chest 2 View  03/08/2012  *RADIOLOGY REPORT*  Clinical Data: Fall, dizziness, cough, smoker, HIV  CHEST - 2 VIEW  Comparison: 12/04/2011  Findings: Streaky bibasilar densities compatible with atelectasis. Negative for focal pneumonia, edema, collapse, consolidation, effusion or pneumothorax.  Trachea midline.  IMPRESSION: Bibasilar atelectasis.   Original Report Authenticated By: Judie Petit. Ruel Favors, M.D.    Ct Head Wo Contrast  03/08/2012  *RADIOLOGY REPORT*  Clinical Data: Fall, eight fascia  CT HEAD WITHOUT CONTRAST  Technique:  Contiguous axial images were obtained from the base of the skull through the vertex without contrast.  Comparison: Brain MRI 12/04/2011; brain CT 12/03/2011  Findings:  No acute intracranial hemorrhage, acute infarction, mass lesion, mass effect, hydrocephalus or midline shift.  Focal hypoattenuating lesions in the left posterior frontal and parietal white matter, and within the subcortical white matter of the right frontal watershed distribution correspond with areas of acute and subacute infarction seen on prior MRI dated 12/04/2011.  No definite new subacute infarct identified by noncontrast CT. Atherosclerotic vascular calcifications of the bilateral cavernous carotid arteries again noted.  Mild generalized cerebellar and cerebral volume loss again noted and appears advanced for age. Mastoid air cells and paranasal sinuses are well-aerated.  No acute bony calvarial abnormality.  The globes are intact.  IMPRESSION:  1.  No acute intracranial abnormality by noncontrast CT scan. 2.  Expected evolution of previously identified infarcts in the periventricular white matter of the left posterior frontal and parietal  lobes, and subcortical white matter of the right frontal watershed region. 3.  Age advanced cerebral and cerebellar volume loss 4.  Intracranial atherosclerosis   Original Report Authenticated ByVilma Prader      Date: 03/08/2012  Rate: 67  Rhythm: normal sinus rhythm  QRS Axis: left  Intervals: normal  ST/T Wave abnormalities: normal  Conduction Disutrbances:none  Narrative Interpretation:   Old EKG Reviewed: unchanged   1. Right sided weakness       MDM  56 yo male with recent mechanical fall yesterday after having 3 days of worsening RUE and RLE weakness. Similar to old stroke weakness but previously had fully recovered. Has had headache x 1 month, mild. Also having acute on chronic low back pain. Neuro exam shows mild weakness on RLE and RUE, doubt back as pathology. Labs obtained to r/o infection as cause of worsening old stroke symptoms. Head CT shows no acute changes. Admit to medicine due to stroke symptoms.        Pricilla Loveless, MD 03/08/12  1705

## 2012-03-08 NOTE — ED Notes (Signed)
PT. BLOODSUGAR IS87MG 

## 2012-03-08 NOTE — H&P (Signed)
Internal Medicine Teaching Service Resident Admission Note Date: 03/08/2012  Patient name: Jacob Joseph Medical record number: [id number] Date of birth: 1956-06-08 Age: 56 y.o. Gender: male PCP: Lars Mage, MD  Medical Service: Internal Medicine Teaching Service  I have reviewed the note by Kathrin Ruddy MS IV and was present during the interview and physical exam.  Please see below for findings, assessment, and plan.  Chief Complaint: right upper and lower extremity weakness  History of Present Illness: Jacob Joseph is a 56 year old man with a history of recent stroke in 11/2011 with residual right sided weakness, HIV, HTN, Diabetes mellitus type II, CKD stage II, and normocytic anemia who presents the [facility] ED for evaluation of worsening of his right sided upper and lower extremity weakness.  He states that for the last 2-3 days he has noticed that he has had more weakness on the right side, especially with the right leg and right hand.  He also notes that he has some mild numbness in the right lateral foot.  He states that his wife has noticed that he is dragging his right leg more and that he has been slurring his words worse then the week previous.  He states that he feels like sometimes he cant make the words but denies any problems with word finding.    He continues to smoke cigarettes with at least a 35 pack year history.  He currently smokes about 1/2 ppd.  He also uses cocaine about 1-2 times per week with his last use being about a week prior to admission.  He states that this is much less then before his stroke in May.  He does have a central forehead headache for the last 4-5 days as well as a flare of his chronic back pain.  He denies diarrhea, constipation, changes in his urination, changes in diet, chest pain, syncope, palpitations, SOB, or abdominal pain.    He was diagnosed with HIV in 1984 and states that he has been taking his medications.  His last CD4 in the system was 407 with an  undetectable viral load.  He states that all of his family members know of his diagnosis.    Meds: Medications Prior to Admission  Medication Sig Dispense Refill  . aspirin EC 81 MG EC tablet Take 1 tablet (81 mg total) by mouth daily.  30 tablet  11  . atorvastatin (LIPITOR) 10 MG tablet Take 1 tablet (10 mg total) by mouth daily at 6 PM.  30 tablet  11  . clopidogrel (PLAVIX) 75 MG tablet Take 1 tablet (75 mg total) by mouth daily.  30 tablet  2  . lamiVUDine-zidovudine (COMBIVIR) 150-300 MG per tablet Take 1 tablet by mouth 2 (two) times daily.      [name] Kitchen lopinavir-ritonavir (KALETRA) 200-50 MG per tablet Take 2 tablets by mouth 2 (two) times daily.      . mirtazapine (REMERON) 30 MG tablet Take 45 mg by mouth at bedtime.      [name] Kitchen QUEtiapine (SEROQUEL) 100 MG tablet Take 300 mg by mouth at bedtime.      [name] Kitchen tenofovir (VIREAD) 300 MG tablet Take 300 mg by mouth daily.      [name] Kitchen zolpidem (AMBIEN) 10 MG tablet Take 10 mg by mouth at bedtime as needed. For sleep       Allergies: Allergies as of 03/08/2012  . (No Known Allergies)   Past Medical History: Medical Student note reviewed  Family History: Medical Student note reviewed  Social History: Psychologist, occupational note reviewed  Surgical History: Medical Student note reviewed  Review of System: Medical Student note reviewed  Physical Exam: Blood pressure 169/90, pulse 62, temperature 98.8 F (37.1 C), temperature source Oral, resp. rate 18, height 6' (1.829 m), weight 187 lb 13.3 oz (85.2 kg), SpO2 100.00%. Constitutional: Vital signs reviewed.  Patient is a well-developed and well-nourished man in no acute distress and cooperative with exam. Alert and oriented x3.  Head: Normocephalic and atraumatic Ear: TM normal bilaterally Mouth: no erythema or exudates, MMM Eyes: PERRL, EOMI, conjunctivae normal, No scleral icterus.  Neck: Supple, Trachea midline normal ROM, No JVD, mass, thyromegaly, or carotid bruit present.  Cardiovascular: RRR, S1  normal, S2 normal, no MRG, pulses symmetric and intact bilaterally Pulmonary/Chest: CTAB, no wheezes, rales, or rhonchi Abdominal: Soft. Non-tender, non-distended, bowel sounds are normal, no masses, organomegaly, or guarding present.  GU: no CVA tenderness Musculoskeletal: No joint deformities, erythema, or stiffness, ROM full and no nontender Hematology: no cervical, inginal, or axillary adenopathy.  Neurological: A&O x3, There is mild loss of sensation around the 5th toe on the right foot to the mid foot.  Sensation on the left foot as well as on the right heel and ankle are intact and symmetric.  He has moderate muscle atrophy noted on the dorsum of the right hand as well as in the right leg around the knee.  He also has muscle atrophy on the hypothenar and thenar eminance on the right. right grip strength was 4/5 compared to the left.  Strength in the right elbow and shoulder are 5/5.  Planter flexion of the right foot is 4+/5 when compared to the left.  DTRs are 2+ in the bilateral biceps, knees, and ankle.  No clonus noted and babinski is downgoing.   Skin: Warm, dry and intact. No rash, cyanosis, or clubbing.  Psychiatric: Normal mood and affect. speech and behavior is normal. Judgment and thought content normal. Cognition and memory are normal.   Labs: Reviewed as noted in the Electronic Record  Imaging: Reviewed as noted in the Electronic Record  Assessment & Plan by Problem: 1. Recurrent Stroke Symptoms: Jacob Joseph is having recurrent symptoms of his previous stroke with no new neurologic signs. Head CT was negative for any new lesions and his physical exam was c/w previous symptomatology. He says that since his previous stroke he has been attending PT/OT and speech therapy and has been showing improvement up until this abrupt decrease over the last few days.   This is unlikely to be a new CVA, but may be an extension of his previous infarct. Risk factors include untreated hypertension,  continued smoking, and recent cocaine use. If imaging shows a new stroke, we will consider echo and carotid dopplers. It is unlikely that either of these has changed since his recent stroke workup in May.  - Admit to Neuro floor on 6N, monitor on telemetry  - AM MRI/MRA  - Q4 Neuro checks  - Risk stratify with A1C and lipid panel  - AM CBC and BMP  - trend cardiac enzymes  - Continue ASA 81, atorvastatin 10 mg, clopidogrel 75 mg tablet. If no new infarct is found  on MRI, can d/c the clopidogrel since he has finished a 3 month regimen.  - PT/OT consult  - Heart Healthy diet.   - Encourage smoking and cocaine use.   2. HIV: Jacob Joseph has a history of compliance with his HIV regimen. Today he is neutropenic (WBC  3.9), concerning for decompensation.  He has no indication for any OI prophylaxis.   - Obtain CD4 and viral load  - Continue current HIV regimen: combivir (lamivudine-zidovudine) 150-300, kaletra   (lopinavir-ritonavir) 200-50, tenofovir 300  - consider ID consult pending CD4, viral load levels  3. CKD Stage II with mild proteinuria: Patient has an increased protein at 30 today. This is a chronic process for him and he appears to be around his baseline.    - Consider obtaining urinary protein and creatinine to assess for nephrotic vs. Nephritic   syndrome  - Provide 1 bolus NS  - AM BMP  - consider renal c/s if Cr worsens  4. Depression: Patient's mood has been stable since his last discharge.  - Continue home meds: seroquel 100 mg tab PO QDay, remeron 30 mg tab PO QDay.  - Hold ambien 10 mg tab PO QHS  5. Chronic Lower back pain: Based on physical exam, patients lower back pain more c/w muscle strain rather than a skeletal process. His tenderness was lateral to the spinous process.  - Tylenol 650 mg PO/suppository Q6H PRN Pain  - Oxycodone 5 mg tab Q4H PRN moderate pain  - Consider muscle relaxant like Flexeril  6. Hx of Substance abuse: Jacob Joseph has a history of  smoking, alcohol, cocaine, and heroin use. There is concern that recent cocaine use along with smoking may have contributed to his current symptoms. Cannot rule out alcoholism. Since he hasn't used cocaine in 1.5 weeks, his cocaine screen should be negative.  - UDS pending  - 1 Bolus NS thiamine 100 mg, folic acid 1 mg, multivitamins  - Smoking cessation per protocol  7. DVT Ppx: Lovenox 40 mg subQ QDay.  8. Code Status: Full code  Signed: Demarrio Menges 03/08/2012, 11:51 PM     Medical Student Hospital Admission Note Date: 03/08/2012  Patient name: Harriet Bollen Medical record number: [id number] Date of birth: [birth year] Age: 56 y.o. Gender: male PCP: Lars Mage, MD  Medical Service: Medical Teaching: Toniann Ket  Attending physician: Joines     Chief Complaint: RUE and RLE weakness with right lateral foot loss of sensation.  History of Present Illness: Jacob Joseph is a 56 yo man with a history of stroke on 5/13 presenting with recurrent focal neurologic symptoms for the past 2-3 days. He says he was doing his usual routine and suddenly started feeling right upper and right lower extremity weakness. His right hand strength has been particularly bad during this time. He also has had decreased in sensation on his lateral right foot as well. No change in gait, family says he has a shuffling gait normally. His wife notes recent slurring of his speech; however, he says he is not having any issues finding appropriate words.   He has an approximate 35 PY smoking history, he still smokes 1/2 PPD. The last time he used cocaine was 1.5 weeks ago, says used cocaine frequently until he had his first stroke. He's had a constant headache in the "center" of his head for the past 4-5 days. He has a history of chronic back pain. He has had a cough productive of clear sputum for the last month. No fevers associated, he does endorse night chills during this time. No diarrhea, constipation, changes in  urination, changes in diet. No chest pain, syncope, palpitations, SOB, abdominal pain.   The patient does have a history of HIVx30 ears who recently had a CD4 count of 407 and an undetectable viral load.  He says he has had not problem taking all of his medications. He is comfortable discussing his status among family members. Meds: Current Outpatient Rx  Name Route Sig Dispense Refill  . ASPIRIN 81 MG PO TBEC Oral Take 1 tablet (81 mg total) by mouth daily. 30 tablet 11  . ATORVASTATIN CALCIUM 10 MG PO TABS Oral Take 1 tablet (10 mg total) by mouth daily at 6 PM. 30 tablet 11  . CLOPIDOGREL BISULFATE 75 MG PO TABS Oral Take 1 tablet (75 mg total) by mouth daily. 30 tablet 2    Please note: patient will only take Plavix 75 mg p ...  . LAMIVUDINE-ZIDOVUDINE 150-300 MG PO TABS Oral Take 1 tablet by mouth 2 (two) times daily.    [name] Kitchen LOPINAVIR-RITONAVIR 200-50 MG PO TABS Oral Take 2 tablets by mouth 2 (two) times daily.    [name] Kitchen MIRTAZAPINE 30 MG PO TABS Oral Take 45 mg by mouth at bedtime.    [name] Kitchen QUETIAPINE FUMARATE 100 MG PO TABS Oral Take 300 mg by mouth at bedtime.    . TENOFOVIR DISOPROXIL FUMARATE 300 MG PO TABS Oral Take 300 mg by mouth daily.    [name] Kitchen ZOLPIDEM TARTRATE 10 MG PO TABS Oral Take 10 mg by mouth at bedtime as needed. For sleep     Allergies: Allergies as of 03/08/2012  . (No Known Allergies)   Past Medical History  Diagnosis Date  . Hypertension   . HIV (human immunodeficiency virus infection)   . Depression   . Stroke    Past Surgical History  Procedure Date  . Tee without cardioversion 12/07/2011    Procedure: TRANSESOPHAGEAL ECHOCARDIOGRAM (TEE);  Surgeon: [name], MD;  Location: [facility] ENDOSCOPY;  Service: Cardiovascular;  Laterality: N/A;  . Inguinal hernia repair 01/16/2012    Procedure: HERNIA REPAIR INGUINAL INCARCERATED;  Surgeon: Liz Malady, MD;  Location: [facility] OR;  Service: General;  Laterality: Right;   Family History  Problem Relation Age of Onset  .  Hypertension Mother   . Hypertension Father   . Cancer Father    History   Social History  . Marital Status: Married    Spouse Name: N/A    Number of Children: N/A  . Years of Education: N/A   Occupational History  . Not on file.   Social History Main Topics  . Smoking status: Current Everyday Smoker -- 0.5 packs/day for 41 years    Types: Cigarettes    Last Attempt to Quit: 12/04/2011  . Smokeless tobacco: Never Used   Comment: quit smioking when he went in the hosp for CVA  . Alcohol Use: 0.0 oz/week    0 drink(s) per week     last time 30 days ago per the pt  . Drug Use: Yes    Special: "Crack" cocaine, Cocaine, Marijuana     last time one week prior to being hospitalized Dec 04, 2011  . Sexually Active: Yes -- Male partner(s)     decined condoms   Other Topics Concern  . Not on file   Social History Narrative   Pt lives with wife and grandkid in [place].Has applied for disability and is pending.Worked in [place] before about 3 years ago.   Review of Systems: Pertinent items are noted in HPI.  Physical Exam: Blood pressure 156/94, pulse 66, temperature 98.5 F (36.9 C), temperature source Oral, resp. rate 16, SpO2 98.00%.  General: resting in bed, NAD HEENT: PERRL, EOMI, no scleral icterus. Cardiac: RRR, no rubs,  murmurs or gallops Pulm: clear to auscultation bilaterally, no wheezes, rales, or rhonchi Abd: soft, nontender, nondistended, BS present Ext: warm and well perfused, no pedal edema MSK: tenderness to palpation on the left side of the lower back lateral to the ~L4 level. Neuro: alert and oriented X3, slig right facial droop with slurred speech. Canial nerves II-XII otherwise intact. Moderate/severe atrophy of the thenar, hypothenar, and interosseus muscles. 4/5 strength in right grip, strength 5/5 throughout otherwise. Decreased sensation on lateral right foot.  Lab results: Basic Metabolic Panel:  Basename 03/08/12 1542  NA 139  K 3.9  CL  106  CO2 25  GLUCOSE 98  BUN 11  CREATININE 1.20  CALCIUM 9.3  MG 2.0  PHOS --   CBC:  Basename 03/08/12 1455  WBC 3.9*  NEUTROABS 1.0*  HGB 12.6*  HCT 38.0*  MCV 98.2  PLT 197   CBG:  Basename 03/08/12 1415  GLUCAP 87   Drugs of Abuse     Component Value Date/Time   LABOPIA POS* 12/23/2011 0955   LABOPIA NONE DETECTED 12/03/2011 1638   COCAINSCRNUR POS* 12/23/2011 0955   COCAINSCRNUR POSITIVE* 12/03/2011 1638   LABBENZ NEG 12/23/2011 0955   LABBENZ NONE DETECTED 12/03/2011 1638   AMPHETMU NONE DETECTED 12/03/2011 1638   THCU NONE DETECTED 12/03/2011 1638   LABBARB NEG 12/23/2011 0955   LABBARB NONE DETECTED 12/03/2011 1638    Urinalysis:  Basename 03/08/12 1415  COLORURINE AMBER*  LABSPEC 1.022  PHURINE 7.0  GLUCOSEU NEGATIVE  HGBUR TRACE*  BILIRUBINUR NEGATIVE  KETONESUR NEGATIVE  PROTEINUR 30*  UROBILINOGEN 2.0*  NITRITE NEGATIVE  LEUKOCYTESUR TRACE*   Imaging results:  Dg Chest 2 View  03/08/2012  *RADIOLOGY REPORT*  Clinical Data: Fall, dizziness, cough, smoker, HIV  CHEST - 2 VIEW  Comparison: 12/04/2011  Findings: Streaky bibasilar densities compatible with atelectasis. Negative for focal pneumonia, edema, collapse, consolidation, effusion or pneumothorax.  Trachea midline.  IMPRESSION: Bibasilar atelectasis.   Original Report Authenticated By: Judie Petit. Ruel Favors, M.D.    Ct Head Wo Contrast  03/08/2012  *RADIOLOGY REPORT*  Clinical Data: Fall, eight fascia  CT HEAD WITHOUT CONTRAST  Technique:  Contiguous axial images were obtained from the base of the skull through the vertex without contrast.  Comparison: Brain MRI 12/04/2011; brain CT 12/03/2011  Findings: No acute intracranial hemorrhage, acute infarction, mass lesion, mass effect, hydrocephalus or midline shift.  Focal hypoattenuating lesions in the left posterior frontal and parietal white matter, and within the subcortical white matter of the right frontal watershed distribution correspond with areas of acute  and subacute infarction seen on prior MRI dated 12/04/2011.  No definite new subacute infarct identified by noncontrast CT. Atherosclerotic vascular calcifications of the bilateral cavernous carotid arteries again noted.  Mild generalized cerebellar and cerebral volume loss again noted and appears advanced for age. Mastoid air cells and paranasal sinuses are well-aerated.  No acute bony calvarial abnormality.  The globes are intact.  IMPRESSION:  1.  No acute intracranial abnormality by noncontrast CT scan. 2.  Expected evolution of previously identified infarcts in the periventricular white matter of the left posterior frontal and parietal lobes, and subcortical white matter of the right frontal watershed region. 3.  Age advanced cerebral and cerebellar volume loss 4.  Intracranial atherosclerosis   Original Report Authenticated By: Vilma Prader    Other results: EKG: normal EKG, normal sinus rhythm, unchanged from previous tracings.  Assessment & Plan by Problem: Principle Problem: Recurrent Stroke Symptoms: Jacob Joseph is having recurrent  symptoms of his previous stroke with no new neurologic signs. Head CT was negative for any new lesions and his physical exam was c/w previous symptomatology. He says that since his previous stroke he has been attending PT/OT and speech therapy and has been showing improvement. However, the past few days have brought an unexplained decompensation. This is unlikely to be a new CVA, but may be an extension of his previous infarct. Risk factors include untreated hypertension, continued smoking, and recent cocaine use. If imaging shows a new stroke, we will consider echo and carotid dopplers. It is unlikely that either of these has changed since his recent stroke workup in May.  - Admit to Neuro floor on 6N, monitor on telemetry - AM MRI/MRA - Q4 Neuro checks - Risk stratify with A1C and lipid panel - AM CBC and BMP - trend cardiac enzymes - Continue ASA 81, atorvastatin 10  mg, clopidogrel 75 mg tablet. If no new infarct is found on MRI, can d/c the clopidogrel since he has finished a 3 month regimen. - PT/OT consult   Active Problems: HIV: Jacob Joseph has a history of compliance with his HIV regimen. Today he is neutropenic (WBC 3.9), concerning for decompensation. - Obtain CD4 and viral load - Continue current HIV regimen: combivir (lamivudine-zidovudine) 150-300, kaletra (lopinavir-ritonavir) 200-50, tenofovir 300 - consider ID consult pending CD4, viral load levels  CKD Stage II with mild proteinuria: Patient has an increased protein at 30 today. This is a chronic process for him. - obtain urinary protein and creatinine to assess for nephrotic syndrome - Provide 1 bolus NS - AM BMP - consider renal c/s if Cr worsens  Depression: Patient's mood has been stable since his last discharge. - Continue home meds: seroquel 100 mg tab PO QDay, remeron 30 mg tab PO QDay. - Hold ambien 10 mg tab PO QHS  Chronic Lower back pain: Based on physical exam, patients lower back pain more c/w muscle strain rather than a skeletal process. His tenderness was lateral to the spinous process. - Tylenol 650 mg PO/suppository Q6H PRN Pain - Oxycodone 5 mg tab Q4H PRN moderate pain  Hx of Substance abuse: Jacob Joseph has a history of smoking, alcohol, cocaine, and heroin use. There is concern that recent cocaine use along with smoking may have contributed to his current symptoms. Cannot rule out alcoholism. Since he hasn't used cocaine in 1.5 weeks, his cocaine screen should be negative. - UDS pending - 1 Bolus NS thiamine 100 mg, folic acid 1 mg, multivitamins - Smoking cessation per protocol  DVT Ppx: Lovenox 40 mg subQ QDay.  Code Status: Full code  Dispo: Inpatient status, will reassess after further imaging. - SW consult  This is a Psychologist, occupational Note.  The care of the patient was discussed with Dr. Tonny Branch and the assessment and plan was formulated with their  assistance.  Please see their note for official documentation of the patient encounter.   Signed: Luretha Rued 03/08/2012, 6:16 PM

## 2012-03-08 NOTE — ED Notes (Signed)
Showed dr Ranae Palms critical istat result

## 2012-03-09 ENCOUNTER — Inpatient Hospital Stay (HOSPITAL_COMMUNITY): Payer: Self-pay

## 2012-03-09 DIAGNOSIS — R0989 Other specified symptoms and signs involving the circulatory and respiratory systems: Secondary | ICD-10-CM

## 2012-03-09 DIAGNOSIS — G8929 Other chronic pain: Secondary | ICD-10-CM | POA: Diagnosis present

## 2012-03-09 DIAGNOSIS — I6789 Other cerebrovascular disease: Secondary | ICD-10-CM

## 2012-03-09 LAB — T-HELPER CELLS (CD4) COUNT (NOT AT ARMC)
CD4 % Helper T Cell: 24 % — ABNORMAL LOW (ref 33–55)
CD4 T Cell Abs: 540 uL (ref 400–2700)

## 2012-03-09 LAB — BASIC METABOLIC PANEL
BUN: 10 mg/dL (ref 6–23)
CO2: 22 mEq/L (ref 19–32)
Calcium: 8.7 mg/dL (ref 8.4–10.5)
Chloride: 106 mEq/L (ref 96–112)
Creatinine, Ser: 1.06 mg/dL (ref 0.50–1.35)
GFR calc Af Amer: >90 mL/min (ref >90)
GFR calc non Af Amer: 78 mL/min — ABNORMAL LOW (ref >90)
Glucose, Bld: 123 mg/dL — ABNORMAL HIGH (ref 70–99)
Potassium: 3.4 mEq/L — ABNORMAL LOW (ref 3.5–5.1)
Sodium: 139 mEq/L (ref 135–145)

## 2012-03-09 LAB — CARDIAC PANEL(CRET KIN+CKTOT+MB+TROPI)
CK, MB: 2.3 ng/mL (ref 0.3–4.0)
CK, MB: 2.3 ng/mL (ref 0.3–4.0)
Relative Index: 0.9 (ref 0.0–2.5)
Relative Index: 1 (ref 0.0–2.5)
Total CK: 268 U/L — ABNORMAL HIGH (ref 7–232)
Total CK: 229 U/L (ref 7–232)
Troponin I: <0.3 ng/mL (ref <0.30)
Troponin I: <0.3 ng/mL (ref <0.30)

## 2012-03-09 LAB — HEMOGLOBIN A1C
Hgb A1c MFr Bld: 5.9 % — ABNORMAL HIGH (ref <5.7)
Mean Plasma Glucose: 123 mg/dL — ABNORMAL HIGH (ref <117)

## 2012-03-09 LAB — GLUCOSE, CAPILLARY
Glucose-Capillary: 128 mg/dL — ABNORMAL HIGH (ref 70–99)
Glucose-Capillary: 97 mg/dL (ref 70–99)
Glucose-Capillary: 79 mg/dL (ref 70–99)

## 2012-03-09 LAB — LIPID PANEL
Cholesterol: 83 mg/dL (ref 0–200)
HDL: 43 mg/dL (ref >39)
LDL Cholesterol: 19 mg/dL (ref 0–99)
Total CHOL/HDL Ratio: 1.9 RATIO
Triglycerides: 107 mg/dL (ref <150)
VLDL: 21 mg/dL (ref 0–40)

## 2012-03-09 MED ORDER — ENSURE COMPLETE PO LIQD
237.0000 mL | Freq: Two times a day (BID) | ORAL | Status: DC
  Administered 2012-03-10 – 2012-03-11 (×3): 237 mL via ORAL

## 2012-03-09 MED ORDER — LISINOPRIL 10 MG PO TABS
10.0000 mg | ORAL_TABLET | Freq: Every day | ORAL | Status: DC
  Administered 2012-03-09 – 2012-03-11 (×3): 10 mg via ORAL
  Filled 2012-03-09 (×4): qty 1

## 2012-03-09 MED ORDER — NICOTINE 14 MG/24HR TD PT24
14.0000 mg | MEDICATED_PATCH | Freq: Every day | TRANSDERMAL | Status: DC
  Administered 2012-03-10 – 2012-03-11 (×3): 14 mg via TRANSDERMAL
  Filled 2012-03-09 (×3): qty 1

## 2012-03-09 MED ORDER — POTASSIUM CHLORIDE CRYS ER 20 MEQ PO TBCR
40.0000 meq | EXTENDED_RELEASE_TABLET | Freq: Once | ORAL | Status: AC
  Administered 2012-03-09: 40 meq via ORAL
  Filled 2012-03-09: qty 2

## 2012-03-09 NOTE — Progress Notes (Addendum)
Subjective: Pt c/o back pain (chronic) 8/10 aching, sharp. Years ago reported injury job related. Wife reports PMH spinal infection 2001/2 and reports was in the hospital. Weakness noted right hand>right leg.  Pt notes weakness is the same since yesterday.  Pt walks at baseline where he drags his right leg uses walker.  Still going to PT/OT/Speech M,W. He reports had trouble formulating thoughts so that why he wanted to go to speech.  Pt denies ab pain, denies dysuria, pt feels like he is emptying bladder. Pt and wife report he is done with drugs.  They have 9 children/10 (1 died) ages 23-36.   Objective: Vital signs in last 24 hours: Filed Vitals:   03/09/12 0558 03/09/12 0938 03/09/12 1345 03/09/12 1802  BP: 125/86 173/91 147/99 157/99  Pulse: 71 74 73 72  Temp: 97.9 F (36.6 C) 98.3 F (36.8 C) 98.6 F (37 C) 98 F (36.7 C)  TempSrc: Oral Oral Oral Oral  Resp: 20 18 20 18   Height:      Weight:      SpO2: 99% 99% 100% 100%   Weight change:   Intake/Output Summary (Last 24 hours) at 03/09/12 1948 Last data filed at 03/09/12 1700  Gross per 24 hour  Intake   1720 ml  Output   2300 ml  Net   -580 ml   Vitals reviewed. General: resting in bed, NAD HEENT:  no scleral icterus Cardiac: RRR, no rubs, murmurs or gallops Pulm: clear to auscultation bilaterally, no wheezes, rales, or rhonchi Abd: soft, min ttp left lower quad, nondistended, BS present (normal) Ext: warm and well perfused, no pedal edema Neuro: alert and oriented X3, CN 2-12 grossly intact, V1-V3 sensation b/l intact, sensation upper ext b/l and lower ext b/l intact, 5/5 strength left upper/lower ext, 4+/5 strength right upper and lower ext, no facial droop, no aphasia noted  Lab Results: Basic Metabolic Panel:  Lab 03/09/12 1610 03/08/12 1542  NA 139 139  K 3.4* 3.9  CL 106 106  CO2 22 25  GLUCOSE 123* 98  BUN 10 11  CREATININE 1.06 1.20  CALCIUM 8.7 9.3  MG -- 2.0  PHOS -- --   Liver Function Tests:  Lab  03/08/12 2106  AST 146*  ALT 127*  ALKPHOS 100  BILITOT 0.5  PROT 8.0  ALBUMIN 3.2*   CBC:  Lab 03/08/12 1455  WBC 3.9*  NEUTROABS 1.0*  HGB 12.6*  HCT 38.0*  MCV 98.2  PLT 197   Cardiac Enzymes:  Lab 03/09/12 0915 03/09/12 0222 03/08/12 2046  CKTOTAL 229 268* 300*  CKMB 2.3 2.3 2.7  CKMBINDEX -- -- --  TROPONINI <0.30 <0.30 <0.30   CBG:  Lab 03/09/12 1631 03/09/12 1118 03/09/12 0658 03/08/12 2141 03/08/12 1415  GLUCAP 79 112* 97 128* 87   Hemoglobin A1C:  Lab 03/08/12 2106  HGBA1C 5.9*   Fasting Lipid Panel:  Lab 03/09/12 0222  CHOL 83  HDL 43  LDLCALC 19  TRIG 107  CHOLHDL 1.9  LDLDIRECT --   Pending drugs of abuse screen  Urinalysis:  Lab 03/08/12 1415  COLORURINE AMBER*  LABSPEC 1.022  PHURINE 7.0  GLUCOSEU NEGATIVE  HGBUR TRACE*  BILIRUBINUR NEGATIVE  KETONESUR NEGATIVE  PROTEINUR 30*  UROBILINOGEN 2.0*  NITRITE NEGATIVE  LEUKOCYTESUR TRACE*   Misc. Labs: Pending drugs of abuse screen, pending HCV and HBV labs   Micro Results: none  Studies/Results: Dg Chest 2 View  03/08/2012  *RADIOLOGY REPORT*  Clinical Data: Fall, dizziness, cough,  smoker, HIV  CHEST - 2 VIEW  Comparison: 12/04/2011  Findings: Streaky bibasilar densities compatible with atelectasis. Negative for focal pneumonia, edema, collapse, consolidation, effusion or pneumothorax.  Trachea midline.  IMPRESSION: Bibasilar atelectasis.   Original Report Authenticated By: Judie Petit. Ruel Favors, M.D.    Ct Head Wo Contrast  03/08/2012  *RADIOLOGY REPORT*  Clinical Data: Fall, eight fascia  CT HEAD WITHOUT CONTRAST  Technique:  Contiguous axial images were obtained from the base of the skull through the vertex without contrast.  Comparison: Brain MRI 12/04/2011; brain CT 12/03/2011  Findings: No acute intracranial hemorrhage, acute infarction, mass lesion, mass effect, hydrocephalus or midline shift.  Focal hypoattenuating lesions in the left posterior frontal and parietal white matter, and  within the subcortical white matter of the right frontal watershed distribution correspond with areas of acute and subacute infarction seen on prior MRI dated 12/04/2011.  No definite new subacute infarct identified by noncontrast CT. Atherosclerotic vascular calcifications of the bilateral cavernous carotid arteries again noted.  Mild generalized cerebellar and cerebral volume loss again noted and appears advanced for age. Mastoid air cells and paranasal sinuses are well-aerated.  No acute bony calvarial abnormality.  The globes are intact.  IMPRESSION:  1.  No acute intracranial abnormality by noncontrast CT scan. 2.  Expected evolution of previously identified infarcts in the periventricular white matter of the left posterior frontal and parietal lobes, and subcortical white matter of the right frontal watershed region. 3.  Age advanced cerebral and cerebellar volume loss 4.  Intracranial atherosclerosis   Original Report Authenticated By: Vilma Prader    [name] Head Wo Contrast  03/09/2012  *RADIOLOGY REPORT*  Clinical Data:  Worsening right-sided weakness.  MRI HEAD WITHOUT CONTRAST MRA HEAD WITHOUT CONTRAST  Technique:  Multiplanar, multiecho pulse sequences of the brain and surrounding structures were obtained without intravenous contrast. Angiographic images of the head were obtained using MRA technique without contrast.  Comparison:  Head CT 03/08/2012.  MRI 12/04/2011.  MRI HEAD  Findings:  Diffusion imaging does not show any acute or subacute infarction.  The brainstem and cerebellum are normal.  The cerebral hemispheres show old small vessel infarctions affecting the hemispheric white matter, most pronounced in the deep white matter on the left.  There is an old cortical and subcortical infarction in the right frontoparietal vertex.  No large vessel territory infarction.  No mass lesion, hemorrhage, hydrocephalus or extra- axial collection.  No pituitary mass.  No inflammatory sinus disease.  No skull or  skull base lesion.  IMPRESSION: No acute infarction.  Scattered old small vessel infarctions affecting the cerebral hemispheres that could relate to chronic small vessel disease or small embolic infarctions.  MRA HEAD  Findings: Both internal carotid arteries are widely patent at the skull base.  On the right, the anterior and middle cerebral vessels are patent.  No correctable proximal stenosis.  Both anterior cerebral arteries receive their supply from the right side.  On the left, there is chronic occlusion of the supraclinoid internal carotid artery.  Left posterior cerebral artery takes a fetal origin from the anterior circulation.  Both vertebral arteries are patent to the basilar.  No basilar stenosis.  Posterior circulation branch vessels are patent, with left PCA taking a fetal origin as described above.  IMPRESSION: No change since the previous study.  Complete occlusion of the left supraclinoid internal carotid artery after the fetal origin of the left PCA.   Original Report Authenticated By: Thomasenia Sales, M.D.  Mr Brain Wo Contrast  03/09/2012  *RADIOLOGY REPORT*  Clinical Data:  Worsening right-sided weakness.  MRI HEAD WITHOUT CONTRAST MRA HEAD WITHOUT CONTRAST  Technique:  Multiplanar, multiecho pulse sequences of the brain and surrounding structures were obtained without intravenous contrast. Angiographic images of the head were obtained using MRA technique without contrast.  Comparison:  Head CT 03/08/2012.  MRI 12/04/2011.  MRI HEAD  Findings:  Diffusion imaging does not show any acute or subacute infarction.  The brainstem and cerebellum are normal.  The cerebral hemispheres show old small vessel infarctions affecting the hemispheric white matter, most pronounced in the deep white matter on the left.  There is an old cortical and subcortical infarction in the right frontoparietal vertex.  No large vessel territory infarction.  No mass lesion, hemorrhage, hydrocephalus or extra- axial  collection.  No pituitary mass.  No inflammatory sinus disease.  No skull or skull base lesion.  IMPRESSION: No acute infarction.  Scattered old small vessel infarctions affecting the cerebral hemispheres that could relate to chronic small vessel disease or small embolic infarctions.  MRA HEAD  Findings: Both internal carotid arteries are widely patent at the skull base.  On the right, the anterior and middle cerebral vessels are patent.  No correctable proximal stenosis.  Both anterior cerebral arteries receive their supply from the right side.  On the left, there is chronic occlusion of the supraclinoid internal carotid artery.  Left posterior cerebral artery takes a fetal origin from the anterior circulation.  Both vertebral arteries are patent to the basilar.  No basilar stenosis.  Posterior circulation branch vessels are patent, with left PCA taking a fetal origin as described above.  IMPRESSION: No change since the previous study.  Complete occlusion of the left supraclinoid internal carotid artery after the fetal origin of the left PCA.   Original Report Authenticated By: Thomasenia Sales, M.D.    Medications:  Scheduled Meds:    . aspirin EC  81 mg Oral Daily  . atorvastatin  10 mg Oral q1800  . clopidogrel  75 mg Oral Daily  . enoxaparin (LOVENOX) injection  40 mg Subcutaneous Q24H  . feeding supplement  237 mL Oral BID BM  . insulin aspart  0-9 Units Subcutaneous TID WC  . lamiVUDine-zidovudine  1 tablet Oral BID  . lisinopril  10 mg Oral Daily  . lopinavir-ritonavir  2 tablet Oral BID  . mirtazapine  45 mg Oral QHS  . nicotine  14 mg Transdermal Daily  . potassium chloride  40 mEq Oral Once  . QUEtiapine  300 mg Oral QHS  . general admission iv infusion   Intravenous Once  . sodium chloride  3 mL Intravenous Q12H  . tenofovir  300 mg Oral Daily   Continuous Infusions:  PRN Meds:.acetaminophen, acetaminophen, ondansetron (ZOFRAN) IV, ondansetron, oxyCODONE Assessment/Plan: 56 y.o  man PMH HTN, HIV, depression, CVA (11/2010) with residual right sided hemiparesis, polysubstance/tobacco abuse, CKD, chronic back pain, HCV presents with subjectively increased right sided weakness, dysarthria on 03/08/12.    1. Right sided weakness  -pt has residual right sided weakness s/p remote CVA; no new objective findings on physical exam  -concern for acute CVA  S/p remote CVA -CT head neg except for previously identified infarcts and cerebral/cerebellar volume loss -MRI/A-neg for acute infarction; noted old infarctions and chronic small vessel dz; left ICA occlusion and abnormal left arterial circulation  -pending carotid doppler -Neuro consulted see noted 8/21 note with recs no change in antiplt tx continue  ASA and Plavix  -PT/OT and continue outpt PT/OT/Speech   2. ESSENTIAL HYPERTENSION  -will monitor -elevated this admission  -Neuro rec add ACEI-added Lisinopril 10 mg qd   3. HIV INFECTION  -cont Combivir, Kaletra, Viread -CD4 ct 540 on 03/08/12, viral load <20 in 11/2011  4. Elevated LFTs  -trending cmp -etiology could be 2/2 statin holding Lipitor 10 mg; consider Pravastatin     Component Value Date/Time   CHOL 83 03/09/2012 0222   TRIG 107 03/09/2012 0222   HDL 43 03/09/2012 0222   CHOLHDL 1.9 03/09/2012 0222   VLDL 21 03/09/2012 0222   LDLCALC 19 03/09/2012 0222    -etiology could be 2/2 HCV -pending HCV, HBV studies   5. History of hepatitis  -viral load 04/2008 was 19 million HCV -repeated hepatitis C studies and ordered hep B studies  6. TOBACCO USER/Polysubstance abuse  -smoking cessation ordered -social work ordered to help with polysubstance -pending tox screen  7. History DIABETES MELLITUS, TYPE II per hx -HA1C 5.9% -cbg monitoring -SSI  8. INSOMNIA, CHRONIC  -continued Remeron 45 mg qhs  9. Normocytic anemia/leukopenia -will trend cbc  10. History of CKD -last urine Alb/Cr ratio was 9.3 (<30) in 12/2011 -started ACEI for renal protection in h/o  DM  11. Chronic back pain -lumbar spine xray 06/2008 showed endplate degenerative changes worst along superior and inferior endplates of L4; mild spondylosis  -prn Oxycodone IR 5 mg q 4  -pt wants Rx for outpt tx -He reports he will follow with Dr. Eben Burow -Advised pt with pain contract outpt UDS are random and if + it is violation  12. F/E/N -BB with thiamine, folic acid, mvt 100 cc/hr -Hypokalemia today 3.4 replaced with 40 meQ K, will monitor electrolytes -diet ordered   13. DVT px  -Lovenox   14. Dispo -pending w/u     LOS: 1 day   Annett Gula [phone] 03/09/2012, 7:48 PM

## 2012-03-09 NOTE — Progress Notes (Signed)
See my note as well 03/09/12 regarding pt's care. Reviewed

## 2012-03-09 NOTE — Progress Notes (Signed)
Medical Student Daily Progress Note  Subjective: [name] is feeling better overall, except for lower back pain that has been aggravating him. His neuo symptoms are about the same as he did when he first came in. Weakness is still present, slurred speech, etc. No acute events overnight. He is eating well and says that he had a good session with PT/OT this morning. No BM this hospitalization yet.  Objective: Vital signs in last 24 hours: Filed Vitals:   03/08/12 2032 03/09/12 0157 03/09/12 0558 03/09/12 0938  BP: 169/90 143/92 125/86 173/91  Pulse: 62 73 71 74  Temp: 98.8 F (37.1 C) 97.9 F (36.6 C) 97.9 F (36.6 C) 98.3 F (36.8 C)  TempSrc: Oral Oral Oral Oral  Resp: 18 20 20 18   Height: 6' (1.829 m)     Weight: 85.2 kg (187 lb 13.3 oz)     SpO2: 100% 100% 99% 99%   Weight change:   Intake/Output Summary (Last 24 hours) at 03/09/12 1105 Last data filed at 03/09/12 0900  Gross per 24 hour  Intake   1060 ml  Output    750 ml  Net    310 ml   Physical Exam: Vitals reviewed. General: resting in bed, NAD HEENT: PERRL, EOMI, no scleral icterus Cardiac: RRR, no rubs, murmurs or gallops Pulm: clear to auscultation bilaterally, no wheezes, rales, or rhonchi Abd: soft, nontender, nondistended, BS present Ext: warm and well perfused, no pedal edema Neurological: A&O x3, loss of sensation around the 5th toe on the right foot to the mid foot. Sensation on the left foot as well as on the right heel and ankle are intact and symmetric. He has moderate muscle atrophy noted on the dorsum, hypothenar and thenar eminance on the right hand as well as in the right leg around the knee. right grip strength was 4/5 compared to the left. Strength in the right elbow and shoulder are 5/5. Planter flexion of the right foot is 4+/5 when compared to the left. DTRs are 2+ in the bilateral biceps, knees, and ankle. Babinski is downgoing.   Skin: Warm, dry and intact. No rash, cyanosis, or clubbing.     Psychiatric: Normal mood and affect. speech and behavior is normal. Judgment and thought content normal. Cognition and memory are normal.   Lab Results: Basic Metabolic Panel:  Lab 03/09/12 1610 03/08/12 1542  NA 139 139  K 3.4* 3.9  CL 106 106  CO2 22 25  GLUCOSE 123* 98  BUN 10 11  CREATININE 1.06 1.20  CALCIUM 8.7 9.3  MG -- 2.0  PHOS -- --   Liver Function Tests:  Lab 03/08/12 2106  AST 146*  ALT 127*  ALKPHOS 100  BILITOT 0.5  PROT 8.0  ALBUMIN 3.2*   No results found for this basename: LIPASE:2,AMYLASE:2 in the last 168 hours No results found for this basename: AMMONIA:2 in the last 168 hours CBC:  Lab 03/08/12 1455  WBC 3.9*  NEUTROABS 1.0*  HGB 12.6*  HCT 38.0*  MCV 98.2  PLT 197   Cardiac Enzymes:  Lab 03/09/12 0915 03/09/12 0222 03/08/12 2046  CKTOTAL 229 268* 300*  CKMB 2.3 2.3 2.7  CKMBINDEX -- -- --  TROPONINI <0.30 <0.30 <0.30   BNP: No results found for this basename: PROBNP:3 in the last 168 hours D-Dimer: No results found for this basename: DDIMER:2 in the last 168 hours CBG:  Lab 03/09/12 0658 03/08/12 2141 03/08/12 1415  GLUCAP 97 128* 87   Hemoglobin A1C:  Lab 03/08/12 2106  HGBA1C 5.9*   Fasting Lipid Panel:  Lab 03/09/12 0222  CHOL 83  HDL 43  LDLCALC 19  TRIG 107  CHOLHDL 1.9  LDLDIRECT --   Urine Drug Screen: Drugs of Abuse     Component Value Date/Time   LABOPIA POS* 12/23/2011 0955   LABOPIA NONE DETECTED 12/03/2011 1638   COCAINSCRNUR POS* 12/23/2011 0955   COCAINSCRNUR POSITIVE* 12/03/2011 1638   LABBENZ NEG 12/23/2011 0955   LABBENZ NONE DETECTED 12/03/2011 1638   AMPHETMU NONE DETECTED 12/03/2011 1638   THCU NONE DETECTED 12/03/2011 1638   LABBARB NEG 12/23/2011 0955   LABBARB NONE DETECTED 12/03/2011 1638   Urinalysis:  Lab 03/08/12 1415  COLORURINE AMBER*  LABSPEC 1.022  PHURINE 7.0  GLUCOSEU NEGATIVE  HGBUR TRACE*  BILIRUBINUR NEGATIVE  KETONESUR NEGATIVE  PROTEINUR 30*  UROBILINOGEN 2.0*  NITRITE  NEGATIVE  LEUKOCYTESUR TRACE*    Studies/Results: Dg Chest 2 View  03/08/2012  *RADIOLOGY REPORT*  Clinical Data: Fall, dizziness, cough, smoker, HIV  CHEST - 2 VIEW  Comparison: 12/04/2011  Findings: Streaky bibasilar densities compatible with atelectasis. Negative for focal pneumonia, edema, collapse, consolidation, effusion or pneumothorax.  Trachea midline.  IMPRESSION: Bibasilar atelectasis.   Original Report Authenticated By: Judie Petit. Ruel Favors, M.D.    Ct Head Wo Contrast  03/08/2012  *RADIOLOGY REPORT*  Clinical Data: Fall, eight fascia  CT HEAD WITHOUT CONTRAST  Technique:  Contiguous axial images were obtained from the base of the skull through the vertex without contrast.  Comparison: Brain MRI 12/04/2011; brain CT 12/03/2011  Findings: No acute intracranial hemorrhage, acute infarction, mass lesion, mass effect, hydrocephalus or midline shift.  Focal hypoattenuating lesions in the left posterior frontal and parietal white matter, and within the subcortical white matter of the right frontal watershed distribution correspond with areas of acute and subacute infarction seen on prior MRI dated 12/04/2011.  No definite new subacute infarct identified by noncontrast CT. Atherosclerotic vascular calcifications of the bilateral cavernous carotid arteries again noted.  Mild generalized cerebellar and cerebral volume loss again noted and appears advanced for age. Mastoid air cells and paranasal sinuses are well-aerated.  No acute bony calvarial abnormality.  The globes are intact.  IMPRESSION:  1.  No acute intracranial abnormality by noncontrast CT scan. 2.  Expected evolution of previously identified infarcts in the periventricular white matter of the left posterior frontal and parietal lobes, and subcortical white matter of the right frontal watershed region. 3.  Age advanced cerebral and cerebellar volume loss 4.  Intracranial atherosclerosis   Original Report Authenticated By: Vilma Prader    Mr Jacob Joseph Head Wo  Contrast  03/09/2012  *RADIOLOGY REPORT*  Clinical Data:  Worsening right-sided weakness.  MRI HEAD WITHOUT CONTRAST MRA HEAD WITHOUT CONTRAST  Technique:  Multiplanar, multiecho pulse sequences of the brain and surrounding structures were obtained without intravenous contrast. Angiographic images of the head were obtained using MRA technique without contrast.  Comparison:  Head CT 03/08/2012.  MRI 12/04/2011.  MRI HEAD  Findings:  Diffusion imaging does not show any acute or subacute infarction.  The brainstem and cerebellum are normal.  The cerebral hemispheres show old small vessel infarctions affecting the hemispheric white matter, most pronounced in the deep white matter on the left.  There is an old cortical and subcortical infarction in the right frontoparietal vertex.  No large vessel territory infarction.  No mass lesion, hemorrhage, hydrocephalus or extra- axial collection.  No pituitary mass.  No inflammatory sinus disease.  No skull  or skull base lesion.  IMPRESSION: No acute infarction.  Scattered old small vessel infarctions affecting the cerebral hemispheres that could relate to chronic small vessel disease or small embolic infarctions.  MRA HEAD  Findings: Both internal carotid arteries are widely patent at the skull base.  On the right, the anterior and middle cerebral vessels are patent.  No correctable proximal stenosis.  Both anterior cerebral arteries receive their supply from the right side.  On the left, there is chronic occlusion of the supraclinoid internal carotid artery.  Left posterior cerebral artery takes a fetal origin from the anterior circulation.  Both vertebral arteries are patent to the basilar.  No basilar stenosis.  Posterior circulation branch vessels are patent, with left PCA taking a fetal origin as described above.  IMPRESSION: No change since the previous study.  Complete occlusion of the left supraclinoid internal carotid artery after the fetal origin of the left PCA.    Original Report Authenticated By: Thomasenia Sales, M.D.    Mr Brain Wo Contrast  03/09/2012  *RADIOLOGY REPORT*  Clinical Data:  Worsening right-sided weakness.  MRI HEAD WITHOUT CONTRAST MRA HEAD WITHOUT CONTRAST  Technique:  Multiplanar, multiecho pulse sequences of the brain and surrounding structures were obtained without intravenous contrast. Angiographic images of the head were obtained using MRA technique without contrast.  Comparison:  Head CT 03/08/2012.  MRI 12/04/2011.  MRI HEAD  Findings:  Diffusion imaging does not show any acute or subacute infarction.  The brainstem and cerebellum are normal.  The cerebral hemispheres show old small vessel infarctions affecting the hemispheric white matter, most pronounced in the deep white matter on the left.  There is an old cortical and subcortical infarction in the right frontoparietal vertex.  No large vessel territory infarction.  No mass lesion, hemorrhage, hydrocephalus or extra- axial collection.  No pituitary mass.  No inflammatory sinus disease.  No skull or skull base lesion.  IMPRESSION: No acute infarction.  Scattered old small vessel infarctions affecting the cerebral hemispheres that could relate to chronic small vessel disease or small embolic infarctions.  MRA HEAD  Findings: Both internal carotid arteries are widely patent at the skull base.  On the right, the anterior and middle cerebral vessels are patent.  No correctable proximal stenosis.  Both anterior cerebral arteries receive their supply from the right side.  On the left, there is chronic occlusion of the supraclinoid internal carotid artery.  Left posterior cerebral artery takes a fetal origin from the anterior circulation.  Both vertebral arteries are patent to the basilar.  No basilar stenosis.  Posterior circulation branch vessels are patent, with left PCA taking a fetal origin as described above.  IMPRESSION: No change since the previous study.  Complete occlusion of the left  supraclinoid internal carotid artery after the fetal origin of the left PCA.   Original Report Authenticated By: Thomasenia Sales, M.D.    Medications: I have reviewed the patient's current medications. Scheduled Meds:   . aspirin EC  81 mg Oral Daily  . atorvastatin  10 mg Oral q1800  . clopidogrel  75 mg Oral Daily  . enoxaparin (LOVENOX) injection  40 mg Subcutaneous Q24H  . insulin aspart  0-9 Units Subcutaneous TID WC  . lamiVUDine-zidovudine  1 tablet Oral BID  . lopinavir-ritonavir  2 tablet Oral BID  . mirtazapine  45 mg Oral QHS  . potassium chloride  40 mEq Oral Once  . QUEtiapine  300 mg Oral QHS  . general admission  iv infusion   Intravenous Once  . sodium chloride  3 mL Intravenous Q12H  . tenofovir  300 mg Oral Daily   Continuous Infusions:  PRN Meds:.acetaminophen, acetaminophen, ondansetron (ZOFRAN) IV, ondansetron, oxyCODONE Assessment/Plan: Principal Problem:  *Right sided weakness: Unlikely to be an extension of his previous stroke since the MRI/MRA were benign and the onset of symptoms was 4-5 days ago. He most likely has recurrent symptoms 2/2 vasospasm from any of the following: HTN, smoking, cocaine use. He claimed his last use of cocaine was 1.5 weeks ago, but this would have shown a negative UDS (which was not the case here). Cardiac enzymes were negative. A1C was normal. His exam today is essentially unchanged from yesterday.   The original plan for Mr. Dugo was that he would be on ASA permanently along with plavix x 3 months. He is presenting three months later with a relapse of his symptoms.  - Concern for cocaine abuse and HTN make carotid dissection a concern: will obtain dopplers today - c/s Neurology regarding optimizing his medical regimen - Continue Q4 neuro checks - PT/OT: 24 hr assistance with outpatient OT 2x/wk minimum - d/c telemetry, no cardiac symptoms  Active Problems:  HIV INFECTION: Pts leukopenia was concerning for worsening of his  HIV. - CD4 540 (WNL), 24% of helper T-Cells (range 33-55) -  viral load pending, ID c/s based on results - continue home ART medications  Transaminitis: The patient does have a history of HCV and has recently been started on a statin after his stroke in 5/13. HIs LDL level at 19 is extremely low, indicating that he may need a less potent statin (prava or simva) on d/c. This will allow the anti-inflammatory properties of the statin to continue without affecting his LDL so dramatically.  - Obtain HBV panel and HCV viral load to assess severity of the disease. In 2008 he had a negative HBV surface antibody; if this is still the case then he will need vaccination.   Polysubstance abuse: Mr. Daoust has a history of chornic cocaine abuse, alcohol use, and tobacco use. He has a distant history of IV heroin use as well.  - UDS positive for cocaine and opioids - Nursing, SW to counsel patient regarding cessation methods   ESSENTIAL HYPERTENSION: The patient was on lisinopril 20 mg PO QDay prior to discharge for his previous stroke. There was concern of hypertension on admission with a bp of 169/90, but his bps have steadily normalized during this hospitalization (125/86 on 8/21). - Continue monitoring bp trend to determine if medication is warranted.   CKD (chronic kidney disease), stage II: Patient is currently at his baseline BUN/Cr and GFR. These values along with no clinical symptoms indicate that this is a chronic intra-renal process - AM BMP   DIABETES MELLITUS, TYPE II: A1C 5.9%, continue sliding scale per protocol.  Depression:  - Continue mirtazepine and seroquel, currently holding ambien  Back pain: continue current pain regimen - Consider adding flexeril as a substitute for opioids  FEN/GI: Pt can have regular diet - Continue current bowel regimen  Dispo: Inpatient - f/u appt will be in the [facility] residents clinic at [facility]   LOS: 1 day   This is a Psychologist, occupational Note.  The care of  the patient was discussed with Dr. Shirlee Latch and the assessment and plan formulated with their assistance.  Please see their attached note for official documentation of the daily encounter.  Luretha Rued 03/09/2012, 11:05 AM

## 2012-03-09 NOTE — Progress Notes (Addendum)
*  PRELIMINARY RESULTS* Vascular Ultrasound Carotid Duplex (Doppler) has been completed.  No significant extracranial carotid artery stenosis demonstrated. Vertebrals are patent with antegrade flow. There is no evidence of an intimal flap noted. There appears to be an area of moderate, smooth, homogenous echoes in the right bulb extending into the proximal internal carotid artery.This is suggestive of possible soft plaque vs mural thrombus.  03/09/2012 2:40 PM Gertie Fey, RDMS, RDCS

## 2012-03-09 NOTE — H&P (Signed)
Internal Medicine Attending Admission Note Date: 03/09/2012  Patient name: Jacob Joseph Medical record number: [id number] Date of birth: [birth year] Age: 56 y.o. Gender: male  I saw and evaluated the patient. I reviewed the resident's note and I agree with the resident's findings and plan as documented in the resident's note, with the following additional comments.  Chief Complaint(s): Slurred speech; increased right upper and right lower extremity weakness  History - key components related to admission: Patient is a 56 year old man with a history of HIV, type 2 diabetes, hypertension, prior stroke in may of 2012 with residual right-sided weakness, and other problems as outlined in the medical history, admitted with complaint of slurred speech and increased weakness of his right upper and right lower extremities which began about 3 days prior to admission.   Physical Exam - key components related to admission:  Filed Vitals:   03/09/12 0157 03/09/12 0558 03/09/12 0938 03/09/12 1345  BP: 143/92 125/86 173/91 147/99  Pulse: 73 71 74 73  Temp: 97.9 F (36.6 C) 97.9 F (36.6 C) 98.3 F (36.8 C) 98.6 F (37 C)  TempSrc: Oral Oral Oral Oral  Resp: 20 20 18 20   Height:      Weight:      SpO2: 100% 99% 99% 100%   General: Alert, no distress Lungs: Clear Heart: Regular; no extra sounds or murmurs Abdomen: Bowel sounds present, soft, nontender; no hepatosplenomegaly Extremity: No edema Neurologic: Mild right facial droop; mild dysarthria; 5-/5 right upper extremity strength; 4+/5 right lower extremity strength.  Lab results:   Basic Metabolic Panel:  Basename 03/09/12 0222 03/08/12 1542  NA 139 139  K 3.4* 3.9  CL 106 106  CO2 22 25  GLUCOSE 123* 98  BUN 10 11  CREATININE 1.06 1.20  CALCIUM 8.7 9.3  MG -- 2.0  PHOS -- --   Liver Function Tests:  [facility] 03/08/12 2106  AST 146*  ALT 127*  ALKPHOS 100  BILITOT 0.5  PROT 8.0  ALBUMIN 3.2*    CBC:  Basename  03/08/12 1455  WBC 3.9*  NEUTROABS 1.0*  HGB 12.6*  HCT 38.0*  MCV 98.2  PLT 197    Cardiac Enzymes:  Basename 03/09/12 0915 03/09/12 0222 03/08/12 2046  CKTOTAL 229 268* 300*  CKMB 2.3 2.3 2.7  CKMBINDEX -- -- --  TROPONINI <0.30 <0.30 <0.30    CBG:  Basename 03/09/12 1118 03/09/12 0658 03/08/12 2141 03/08/12 1415  GLUCAP 112* 97 128* 87    Hemoglobin A1C:  Basename 03/08/12 2106  HGBA1C 5.9*    Fasting Lipid Panel:  Basename 03/09/12 0222  CHOL 83  HDL 43  LDLCALC 19  TRIG 107  CHOLHDL 1.9  LDLDIRECT --    Urinalysis    Component Value Date/Time   COLORURINE AMBER* 03/08/2012 1415   APPEARANCEUR HAZY* 03/08/2012 1415   LABSPEC 1.022 03/08/2012 1415   PHURINE 7.0 03/08/2012 1415   GLUCOSEU NEGATIVE 03/08/2012 1415   HGBUR TRACE* 03/08/2012 1415   HGBUR negative 12/25/2008 1206   BILIRUBINUR NEGATIVE 03/08/2012 1415   KETONESUR NEGATIVE 03/08/2012 1415   PROTEINUR 30* 03/08/2012 1415   UROBILINOGEN 2.0* 03/08/2012 1415   NITRITE NEGATIVE 03/08/2012 1415   LEUKOCYTESUR TRACE* 03/08/2012 1415    Urine microscopic: WBCs 0-2, RBCs 0-2, few squamous epithelial, rare bacteria    Imaging results:  Dg Chest 2 View  03/08/2012  *RADIOLOGY REPORT*  Clinical Data: Fall, dizziness, cough, smoker, HIV  CHEST - 2 VIEW  Comparison: 12/04/2011  Findings: Streaky bibasilar  densities compatible with atelectasis. Negative for focal pneumonia, edema, collapse, consolidation, effusion or pneumothorax.  Trachea midline.  IMPRESSION: Bibasilar atelectasis.   Original Report Authenticated By: Judie Petit. Ruel Favors, M.D.    Ct Head Wo Contrast  03/08/2012  *RADIOLOGY REPORT*  Clinical Data: Fall, eight fascia  CT HEAD WITHOUT CONTRAST  Technique:  Contiguous axial images were obtained from the base of the skull through the vertex without contrast.  Comparison: Brain MRI 12/04/2011; brain CT 12/03/2011  Findings: No acute intracranial hemorrhage, acute infarction, mass lesion, mass effect,  hydrocephalus or midline shift.  Focal hypoattenuating lesions in the left posterior frontal and parietal white matter, and within the subcortical white matter of the right frontal watershed distribution correspond with areas of acute and subacute infarction seen on prior MRI dated 12/04/2011.  No definite new subacute infarct identified by noncontrast CT. Atherosclerotic vascular calcifications of the bilateral cavernous carotid arteries again noted.  Mild generalized cerebellar and cerebral volume loss again noted and appears advanced for age. Mastoid air cells and paranasal sinuses are well-aerated.  No acute bony calvarial abnormality.  The globes are intact.  IMPRESSION:  1.  No acute intracranial abnormality by noncontrast CT scan. 2.  Expected evolution of previously identified infarcts in the periventricular white matter of the left posterior frontal and parietal lobes, and subcortical white matter of the right frontal watershed region. 3.  Age advanced cerebral and cerebellar volume loss 4.  Intracranial atherosclerosis   Original Report Authenticated By: Vilma Prader    [name] Head Wo Contrast  03/09/2012  *RADIOLOGY REPORT*  Clinical Data:  Worsening right-sided weakness.  MRI HEAD WITHOUT CONTRAST MRA HEAD WITHOUT CONTRAST  Technique:  Multiplanar, multiecho pulse sequences of the brain and surrounding structures were obtained without intravenous contrast. Angiographic images of the head were obtained using MRA technique without contrast.  Comparison:  Head CT 03/08/2012.  MRI 12/04/2011.  MRI HEAD  Findings:  Diffusion imaging does not show any acute or subacute infarction.  The brainstem and cerebellum are normal.  The cerebral hemispheres show old small vessel infarctions affecting the hemispheric white matter, most pronounced in the deep white matter on the left.  There is an old cortical and subcortical infarction in the right frontoparietal vertex.  No large vessel territory infarction.  No mass  lesion, hemorrhage, hydrocephalus or extra- axial collection.  No pituitary mass.  No inflammatory sinus disease.  No skull or skull base lesion.  IMPRESSION: No acute infarction.  Scattered old small vessel infarctions affecting the cerebral hemispheres that could relate to chronic small vessel disease or small embolic infarctions.  MRA HEAD  Findings: Both internal carotid arteries are widely patent at the skull base.  On the right, the anterior and middle cerebral vessels are patent.  No correctable proximal stenosis.  Both anterior cerebral arteries receive their supply from the right side.  On the left, there is chronic occlusion of the supraclinoid internal carotid artery.  Left posterior cerebral artery takes a fetal origin from the anterior circulation.  Both vertebral arteries are patent to the basilar.  No basilar stenosis.  Posterior circulation branch vessels are patent, with left PCA taking a fetal origin as described above.  IMPRESSION: No change since the previous study.  Complete occlusion of the left supraclinoid internal carotid artery after the fetal origin of the left PCA.   Original Report Authenticated By: Thomasenia Sales, M.D.    Mr Brain Wo Contrast  03/09/2012  *RADIOLOGY REPORT*  Clinical Data:  Worsening  right-sided weakness.  MRI HEAD WITHOUT CONTRAST MRA HEAD WITHOUT CONTRAST  Technique:  Multiplanar, multiecho pulse sequences of the brain and surrounding structures were obtained without intravenous contrast. Angiographic images of the head were obtained using MRA technique without contrast.  Comparison:  Head CT 03/08/2012.  MRI 12/04/2011.  MRI HEAD  Findings:  Diffusion imaging does not show any acute or subacute infarction.  The brainstem and cerebellum are normal.  The cerebral hemispheres show old small vessel infarctions affecting the hemispheric white matter, most pronounced in the deep white matter on the left.  There is an old cortical and subcortical infarction in the right  frontoparietal vertex.  No large vessel territory infarction.  No mass lesion, hemorrhage, hydrocephalus or extra- axial collection.  No pituitary mass.  No inflammatory sinus disease.  No skull or skull base lesion.  IMPRESSION: No acute infarction.  Scattered old small vessel infarctions affecting the cerebral hemispheres that could relate to chronic small vessel disease or small embolic infarctions.  MRA HEAD  Findings: Both internal carotid arteries are widely patent at the skull base.  On the right, the anterior and middle cerebral vessels are patent.  No correctable proximal stenosis.  Both anterior cerebral arteries receive their supply from the right side.  On the left, there is chronic occlusion of the supraclinoid internal carotid artery.  Left posterior cerebral artery takes a fetal origin from the anterior circulation.  Both vertebral arteries are patent to the basilar.  No basilar stenosis.  Posterior circulation branch vessels are patent, with left PCA taking a fetal origin as described above.  IMPRESSION: No change since the previous study.  Complete occlusion of the left supraclinoid internal carotid artery after the fetal origin of the left PCA.   Original Report Authenticated By: Thomasenia Sales, M.D.     Assessment & Plan by Problem:  1.  Worsened dysarthria and subjectively increased right sided weakness.  Patient has prior CVA in May of 2012, with symptoms that he feels are definitely worse than his baseline.  MRI shows no acute stroke, although the symptoms suggest possible extension of his prior stroke.  Seizure seems unlikely, given the persistence of his recent symptoms.  He was discharged in May on both aspirin and Plavix, with a plan of continuing Plavix for 3 months; he reports that he has been compliant with his antiplatelet treatment.  Plan is continue current antiplatelet treatment for now, although a case could be made to stop the aspirin and continue Plavix; neurology consult;  monitor and follow neurologic status.  2.  Elevated transaminase levels.  This is a concern given patient's statin therapy; atorvastatin was started in May although his LDL was only 53 at that time.  Given his baseline normal LDL, and now with his elevated liver enzymes, I would hold the atorvastatin and see if the transaminase elevation resolves.  Would also discuss with his ID physician whether any of his HIV medications might be contributing to this.  His hepatitis C may also be a contributing factor.  3.  Other problems as per resident physician.

## 2012-03-09 NOTE — Consult Note (Signed)
Reason for Consult: right sided weakness Referring Physician: Dr. Meredith Pel  CC: right sided weakness  HPI: Jacob Joseph is an 56 y.o. male who has had intermittent RUE/RLE weakness (more than from baseline from stroke in 11/2011). In addition, wife states that he has looked "slumped over" for 2 days with intermittent drooling which would only last a few seconds. He has been able to eat without problem. Yesterday when he tried to go into the bathroom at home, he fell due to right sided weakness, hitting his head. Denies LOC. At that time, she decided that he needed to come to be evaluated.  In May 2013 MRI revealed: Acute infarcts in the left parietal white matter and left superior temporal cortex with probable subacute infarcts in the deep white matter on the right in a watershed distribution. 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 Left M1 segment occluded. At that time he was on no antiplatelet agents at home and for stroke prevention was placed on baby asa 81mg  daily + Plavix. The plavix was recommended for 3 mos only. He is still taking his plavix.  Stroke workup at that time revealed the following:  2D Echocardiogram EF 55-60% with no source of embolus.  Carotid Doppler No internal carotid artery stenosis bilaterally. Vertebrals with antegrade flow bilaterally.  CXR 12/04/2011 No active cardiopulmonary disease.  TEE no SOE, negative bubble study    Past Medical History  Diagnosis Date  . Hypertension   . HIV (human immunodeficiency virus infection)   . Depression   . Stroke     Past Surgical History  Procedure Date  . Tee without cardioversion 12/07/2011    Procedure: TRANSESOPHAGEAL ECHOCARDIOGRAM (TEE);  Surgeon: [name], MD;  Location: [facility] ENDOSCOPY;  Service: Cardiovascular;  Laterality: N/A;  . Inguinal hernia repair 01/16/2012    Procedure: HERNIA REPAIR INGUINAL INCARCERATED;   Surgeon: Liz Malady, MD;  Location: [facility] OR;  Service: General;  Laterality: Right;    Family History  Problem Relation Age of Onset  . Hypertension Mother   . Hypertension Father   . Cancer Father     Social History:  reports that he has been smoking Cigarettes.  He has a 20.5 pack-year smoking history. He has never used smokeless tobacco. He reports that he drinks alcohol. He reports that he uses illicit drugs ("Crack" cocaine, Cocaine, and Marijuana).  No Known Allergies  Medications:  Prior to Admission:  Prescriptions prior to admission  Medication Sig Dispense Refill  . aspirin EC 81 MG EC tablet Take 1 tablet (81 mg total) by mouth daily.  30 tablet  11  . atorvastatin (LIPITOR) 10 MG tablet Take 1 tablet (10 mg total) by mouth daily at 6 PM.  30 tablet  11  . clopidogrel (PLAVIX) 75 MG tablet Take 1 tablet (75 mg total) by mouth daily.  30 tablet  2  . lamiVUDine-zidovudine (COMBIVIR) 150-300 MG per tablet Take 1 tablet by mouth 2 (two) times daily.      [name] Kitchen lopinavir-ritonavir (KALETRA) 200-50 MG per tablet Take 2 tablets by mouth 2 (two) times daily.      . mirtazapine (REMERON) 30 MG tablet Take 45 mg by mouth at bedtime.      [name] Kitchen QUEtiapine (SEROQUEL) 100 MG tablet Take 300 mg by mouth at bedtime.      [name] Kitchen tenofovir (VIREAD) 300 MG tablet Take 300 mg by mouth daily.      [name] Kitchen  zolpidem (AMBIEN) 10 MG tablet Take 10 mg by mouth at bedtime as needed. For sleep       Scheduled:   . aspirin EC  81 mg Oral Daily  . atorvastatin  10 mg Oral q1800  . clopidogrel  75 mg Oral Daily  . enoxaparin (LOVENOX) injection  40 mg Subcutaneous Q24H  . feeding supplement  237 mL Oral BID BM  . insulin aspart  0-9 Units Subcutaneous TID WC  . lamiVUDine-zidovudine  1 tablet Oral BID  . lopinavir-ritonavir  2 tablet Oral BID  . mirtazapine  45 mg Oral QHS  . potassium chloride  40 mEq Oral Once  . QUEtiapine  300 mg Oral QHS  . general admission iv infusion   Intravenous Once  . sodium  chloride  3 mL Intravenous Q12H  . tenofovir  300 mg Oral Daily    ROS: History obtained from chart review, the patient and family.  General ROS: negative for - chills, fatigue, fever, night sweats, weight gain or weight loss Psychological ROS: negative for - behavioral disorder, hallucinations, memory difficulties, mood swings or suicidal ideation Ophthalmic ROS: negative for - blurry vision, double vision, eye pain or loss of vision ENT ROS: negative for - epistaxis, nasal discharge, oral lesions, sore throat, tinnitus or vertigo Allergy and Immunology ROS: negative for - hives or itchy/watery eyes Hematological and Lymphatic ROS: negative for - bleeding problems, bruising or swollen lymph nodes Endocrine ROS: negative for - galactorrhea, hair pattern changes, polydipsia/polyuria or temperature intolerance Respiratory ROS: negative for - cough, hemoptysis, shortness of breath or wheezing Cardiovascular ROS: negative for - chest pain, dyspnea on exertion, edema or irregular heartbeat Gastrointestinal ROS: negative for - abdominal pain, diarrhea, hematemesis, nausea/vomiting or stool incontinence Genito-Urinary ROS: negative for - dysuria, hematuria, incontinence or urinary frequency/urgency Musculoskeletal ROS: negative for - joint swelling or muscular weakness Neurological ROS: as noted in HPI Dermatological ROS: negative for rash and skin lesion changes   Physical Examination: Blood pressure 147/99, pulse 73, temperature 98.6 F (37 C), temperature source Oral, resp. rate 20, height 6' (1.829 m), weight 85.2 kg (187 lb 13.3 oz), SpO2 100.00%.  Neurologic Examination Mental Status: Alert, oriented, thought content appropriate.  Speech fluent without evidence of aphasia.  Able to follow 3 step commands without difficulty. Cranial Nerves: II: visual fields grossly normal, pupils equal, round, reactive to light and accommodation III,IV, VI: ptosis not present, extra-ocular motions  intact bilaterally V,VII: smile symmetric, facial light touch sensation normal bilaterally VIII: hearing normal bilaterally IX,X: gag reflex present XI: trapezius strength/neck flexion strength normal bilaterally XII: tongue strength normal  Motor: Right : Upper extremity   3/5    Left:     Upper extremity   5/5  Lower extremity   4/5     Lower extremity   5/5 Tone and bulk:normal tone throughout; no atrophy noted Sensory: Pinprick and light touch intact throughout, bilaterally Deep Tendon Reflexes: 2+ and symmetric throughout Plantars: Right: downgoing   Left: downgoing Cerebellar: normal finger-to-nose, normal rapid alternating movements and normal heel-to-shin test normal gait and station    Results for orders placed during the hospital encounter of 03/08/12 (from the past 48 hour(s))  URINALYSIS, ROUTINE W REFLEX MICROSCOPIC     Status: Abnormal   Collection Time   03/08/12  2:15 PM      Component Value Range Comment   Color, Urine AMBER (*) YELLOW BIOCHEMICALS MAY BE AFFECTED BY COLOR   APPearance HAZY (*) CLEAR  Specific Gravity, Urine 1.022  1.005 - 1.030    pH 7.0  5.0 - 8.0    Glucose, UA NEGATIVE  NEGATIVE mg/dL    Hgb urine dipstick TRACE (*) NEGATIVE    Bilirubin Urine NEGATIVE  NEGATIVE    Ketones, ur NEGATIVE  NEGATIVE mg/dL    Protein, ur 30 (*) NEGATIVE mg/dL    Urobilinogen, UA 2.0 (*) 0.0 - 1.0 mg/dL    Nitrite NEGATIVE  NEGATIVE    Leukocytes, UA TRACE (*) NEGATIVE   GLUCOSE, CAPILLARY     Status: Normal   Collection Time   03/08/12  2:15 PM      Component Value Range Comment   Glucose-Capillary 87  70 - 99 mg/dL   URINE MICROSCOPIC-ADD ON     Status: Abnormal   Collection Time   03/08/12  2:15 PM      Component Value Range Comment   Squamous Epithelial / LPF FEW (*) RARE    WBC, UA 0-2  <3 WBC/hpf    RBC / HPF 0-2  <3 RBC/hpf    Bacteria, UA RARE  RARE    Urine-Other MUCOUS PRESENT     CBC WITH DIFFERENTIAL     Status: Abnormal   Collection Time     03/08/12  2:55 PM      Component Value Range Comment   WBC 3.9 (*) 4.0 - 10.5 K/uL    RBC 3.87 (*) 4.22 - 5.81 MIL/uL    Hemoglobin 12.6 (*) 13.0 - 17.0 g/dL    HCT 16.1 (*) 09.6 - 52.0 %    MCV 98.2  78.0 - 100.0 fL    MCH 32.6  26.0 - 34.0 pg    MCHC 33.2  30.0 - 36.0 g/dL    RDW 04.5  40.9 - 81.1 %    Platelets 197  150 - 400 K/uL    Neutrophils Relative 26 (*) 43 - 77 %    Neutro Abs 1.0 (*) 1.7 - 7.7 K/uL    Lymphocytes Relative 52 (*) 12 - 46 %    Lymphs Abs 2.0  0.7 - 4.0 K/uL    Monocytes Relative 15 (*) 3 - 12 %    Monocytes Absolute 0.6  0.1 - 1.0 K/uL    Eosinophils Relative 7 (*) 0 - 5 %    Eosinophils Absolute 0.3  0.0 - 0.7 K/uL    Basophils Relative 0  0 - 1 %    Basophils Absolute 0.0  0.0 - 0.1 K/uL   BASIC METABOLIC PANEL     Status: Abnormal   Collection Time   03/08/12  3:42 PM      Component Value Range Comment   Sodium 139  135 - 145 mEq/L    Potassium 3.9  3.5 - 5.1 mEq/L    Chloride 106  96 - 112 mEq/L    CO2 25  19 - 32 mEq/L    Glucose, Bld 98  70 - 99 mg/dL    BUN 11  6 - 23 mg/dL    Creatinine, Ser 9.14  0.50 - 1.35 mg/dL    Calcium 9.3  8.4 - 78.2 mg/dL    GFR calc non Af Amer 67 (*) >90 mL/min    GFR calc Af Amer 78 (*) >90 mL/min   MAGNESIUM     Status: Normal   Collection Time   03/08/12  3:42 PM      Component Value Range Comment   Magnesium 2.0  1.5 - 2.5  mg/dL   CARDIAC PANEL(CRET KIN+CKTOT+MB+TROPI)     Status: Abnormal   Collection Time   03/08/12  8:46 PM      Component Value Range Comment   Total CK 300 (*) 7 - 232 U/L    CK, MB 2.7  0.3 - 4.0 ng/mL    Troponin I <0.30  <0.30 ng/mL    Relative Index 0.9  0.0 - 2.5   HEPATIC FUNCTION PANEL     Status: Abnormal   Collection Time   03/08/12  9:06 PM      Component Value Range Comment   Total Protein 8.0  6.0 - 8.3 g/dL    Albumin 3.2 (*) 3.5 - 5.2 g/dL    AST 696 (*) 0 - 37 U/L    ALT 127 (*) 0 - 53 U/L    Alkaline Phosphatase 100  39 - 117 U/L    Total Bilirubin 0.5  0.3 -  1.2 mg/dL    Bilirubin, Direct 0.2  0.0 - 0.3 mg/dL    Indirect Bilirubin 0.3  0.3 - 0.9 mg/dL   HEMOGLOBIN E9B     Status: Abnormal   Collection Time   03/08/12  9:06 PM      Component Value Range Comment   Hemoglobin A1C 5.9 (*) <5.7 %    Mean Plasma Glucose 123 (*) <117 mg/dL   T-HELPER CELLS (CD4) COUNT     Status: Abnormal   Collection Time   03/08/12  9:06 PM      Component Value Range Comment   CD4 T Cell Abs 540  400 - 2700 cmm    CD4 % Helper T Cell 24 (*) 33 - 55 %   GLUCOSE, CAPILLARY     Status: Abnormal   Collection Time   03/08/12  9:41 PM      Component Value Range Comment   Glucose-Capillary 128 (*) 70 - 99 mg/dL    Comment 1 Documented in Chart      Comment 2 Notify RN     CARDIAC PANEL(CRET KIN+CKTOT+MB+TROPI)     Status: Abnormal   Collection Time   03/09/12  2:22 AM      Component Value Range Comment   Total CK 268 (*) 7 - 232 U/L    CK, MB 2.3  0.3 - 4.0 ng/mL    Troponin I <0.30  <0.30 ng/mL    Relative Index 0.9  0.0 - 2.5   BASIC METABOLIC PANEL     Status: Abnormal   Collection Time   03/09/12  2:22 AM      Component Value Range Comment   Sodium 139  135 - 145 mEq/L    Potassium 3.4 (*) 3.5 - 5.1 mEq/L    Chloride 106  96 - 112 mEq/L    CO2 22  19 - 32 mEq/L    Glucose, Bld 123 (*) 70 - 99 mg/dL    BUN 10  6 - 23 mg/dL    Creatinine, Ser 2.84  0.50 - 1.35 mg/dL    Calcium 8.7  8.4 - 13.2 mg/dL    GFR calc non Af Amer 78 (*) >90 mL/min    GFR calc Af Amer >90  >90 mL/min   LIPID PANEL     Status: Normal   Collection Time   03/09/12  2:22 AM      Component Value Range Comment   Cholesterol 83  0 - 200 mg/dL    Triglycerides 440  <102 mg/dL  HDL 43  >39 mg/dL    Total CHOL/HDL Ratio 1.9      VLDL 21  0 - 40 mg/dL    LDL Cholesterol 19  0 - 99 mg/dL   GLUCOSE, CAPILLARY     Status: Normal   Collection Time   03/09/12  6:58 AM      Component Value Range Comment   Glucose-Capillary 97  70 - 99 mg/dL    Comment 1 Documented in Chart       Comment 2 Notify RN     CARDIAC PANEL(CRET KIN+CKTOT+MB+TROPI)     Status: Normal   Collection Time   03/09/12  9:15 AM      Component Value Range Comment   Total CK 229  7 - 232 U/L    CK, MB 2.3  0.3 - 4.0 ng/mL    Troponin I <0.30  <0.30 ng/mL    Relative Index 1.0  0.0 - 2.5   GLUCOSE, CAPILLARY     Status: Abnormal   Collection Time   03/09/12 11:18 AM      Component Value Range Comment   Glucose-Capillary 112 (*) 70 - 99 mg/dL     No results found for this or any previous visit (from the past 240 hour(s)).  Dg Chest 2 View 03/08/2012  Bibasilar atelectasis.   Original Report Authenticated By: Judie Petit. Ruel Favors, M.D.    Ct Head Wo Contrast 03/08/2012    1.  No acute intracranial abnormality by noncontrast CT scan. 2.  Expected evolution of previously identified infarcts in the periventricular white matter of the left posterior frontal and parietal lobes, and subcortical white matter of the right frontal watershed region. 3.  Age advanced cerebral and cerebellar volume loss 4.  Intracranial atherosclerosis   Original Report Authenticated By: Vilma Prader    Mr Mra Head/Brain Wo Contrast 03/09/2012   No acute infarction.  Scattered old small vessel infarctions affecting the cerebral hemispheres that could relate to chronic small vessel disease or small embolic infarctions.  No change since the previous study.  Complete occlusion of the left supraclinoid internal carotid artery after the fetal origin of the left PCA.   Original Report Authenticated By: Thomasenia Sales, M.D.     Assessment/Plan:  56 yo male with recent May admission for stroke. MRI Head shows scattered old small vessel infarctions affecting the cerebral hemispheres, and MRA with known occlusion of the left supraclinoid internal carotid artery. Patient states that he has improved strength in his right arm and leg; however it has not returned to his baseline which is weakness.   -Polysubstance abuse history, UDS pending.   -HIV -Hypertension, aggressive management. Patient was on medications in past for bp, would restart ace for hypertension and also reno-protection in diabetes -CKD -Diabetes Mellitus, Hgb A1c=5.9 -Hyperlipidemia, LDL 19 -Elevated transaminases  -Recommendations: Aggressive BP management, encourage cessation of recreational drugs (vasospastic in nature), and compliance. Primary team to address LFT elevation as statins may be a causative factor. No change in antiplatelet therapies at this time.   Job Founds, MBA, MHA [facility] Pager [phone]  03/09/2012, 4:25 PM

## 2012-03-09 NOTE — Progress Notes (Signed)
Physical Therapy Evaluation Patient Details Name: Jacob Joseph MRN: [id number] DOB: Sep 09, 1955 Today's Date: 03/09/2012 Time: 5956-3875 PT Time Calculation (min): 20 min  PT Assessment / Plan / Recommendation Clinical Impression  56 yo admitted with Right sided-weakness, recent CVA this year in May; imaging showing no acute stroke; Presents with some decr functional mobility; Will benefit from pt in acute setting to maximize independence and safety with mobility, amb to enable dc hoem    PT Assessment  Patient needs continued PT services    Follow Up Recommendations  Outpatient PT;Supervision/Assistance - 24 hour    Barriers to Discharge        Equipment Recommendations  None recommended by PT    Recommendations for Other Services     Frequency Min 3X/week    Precautions / Restrictions Precautions Precautions: Fall   Pertinent Vitals/Pain Back pain with amb; 6/10; reposititioned in recliner      Mobility  Bed Mobility Bed Mobility: Supine to Sit;Sitting - Scoot to Edge of Bed Supine to Sit: 5: Supervision;With rails;HOB elevated Sitting - Scoot to Edge of Bed: 5: Supervision;With rail Details for Bed Mobility Assistance: Required increased time. Transfers Transfers: Sit to Stand;Stand to Sit Sit to Stand: 4: Min guard;From bed;With upper extremity assist Stand to Sit: 4: Min guard;To chair/3-in-1;With armrests;With upper extremity assist Details for Transfer Assistance: Increased time to obtain full upright posture.  Min guard for safety, Ambulation/Gait Ambulation/Gait Assistance: 4: Min guard Ambulation Distance (Feet): 110 Feet Assistive device: Rolling walker Ambulation/Gait Assistance Details: Cues for upright posture; Pt reported back pain with amb, and tended to keep hips slightly flexed; minguard for safety Gait Pattern: Step-through pattern Modified Rankin (Stroke Patients Only) Pre-Morbid Rankin Score: Moderately severe disability Modified Rankin:  Moderately severe disability    Exercises     PT Diagnosis: Difficulty walking  PT Problem List: Decreased strength;Decreased balance;Decreased mobility;Decreased knowledge of use of DME;Pain PT Treatment Interventions: DME instruction;Gait training;Stair training;Functional mobility training;Therapeutic activities;Therapeutic exercise;Balance training;Patient/family education   PT Goals Acute Rehab PT Goals PT Goal Formulation: With patient Time For Goal Achievement: 03/09/12 Potential to Achieve Goals: Good Pt will go Supine/Side to Sit: with modified independence PT Goal: Supine/Side to Sit - Progress: Goal set today Pt will go Sit to Supine/Side: with modified independence PT Goal: Sit to Supine/Side - Progress: Goal set today Pt will go Sit to Stand: with modified independence PT Goal: Sit to Stand - Progress: Goal set today Pt will go Stand to Sit: with modified independence PT Goal: Stand to Sit - Progress: Goal set today Pt will Ambulate: >150 feet;with modified independence;with least restrictive assistive device PT Goal: Ambulate - Progress: Goal set today Pt will Go Up / Down Stairs: 1-2 stairs;with modified independence;with least restrictive assistive device PT Goal: Up/Down Stairs - Progress: Goal set today  Visit Information  Last PT Received On: 03/09/12 Assistance Needed: +1 PT/OT Co-Evaluation/Treatment: Yes    Subjective Data  Subjective: Agreeable to OOB Patient Stated Goal: Home, and would like to re-start Outpt therapies   Prior Functioning  Home Living Lives With: Spouse Available Help at Discharge: Family;Available 24 hours/day Type of Home: House Home Access: Stairs to enter [employer] of Steps: 1 Entrance Stairs-Rails: None Home Layout: One level Bathroom Shower/Tub: Forensic scientist: Standard Bathroom Accessibility: Yes How Accessible: Accessible via walker Home Adaptive Equipment: Bedside commode/3-in-1;Tub  transfer bench;Walker - rolling;Straight cane Prior Function Level of Independence: Needs assistance Needs Assistance: Bathing;Dressing Bath: Maximal Dressing: Maximal Comments: Lots of assist from  wife since stroke in May; has been going to [employer] PT Communication Communication: No difficulties Dominant Hand: Right    Cognition  Overall Cognitive Status: Appears within functional limits for tasks assessed/performed Arousal/Alertness: Awake/alert Orientation Level: Appears intact for tasks assessed Behavior During Session: [facility] for tasks performed    Extremity/Trunk Assessment Right Upper Extremity Assessment RUE ROM/Strength/Tone: Deficits RUE ROM/Strength/Tone Deficits: 3+/5 throughout RUE Sensation: Deficits RUE Sensation Deficits: Pt reports RUE feels "dull." RUE Coordination: Deficits RUE Coordination Deficits: Decreased accuracy and speed with FMC. Left Upper Extremity Assessment LUE ROM/Strength/Tone: Within functional levels Right Lower Extremity Assessment RLE ROM/Strength/Tone: Deficits RLE ROM/Strength/Tone Deficits: hip flexion 3/5, Quad 4/5, ankle dorsiflexion 3/5 Left Lower Extremity Assessment LLE ROM/Strength/Tone: Within functional levels Trunk Assessment Trunk Assessment: Normal   Balance    End of Session PT - End of Session Equipment Utilized During Treatment: Gait belt Activity Tolerance: Patient tolerated treatment well Patient left: in chair;with call bell/phone within reach Nurse Communication: Mobility status  GP     Van Clines [facility] [place], [place] [phone]  03/09/2012, 1:22 PM

## 2012-03-09 NOTE — Progress Notes (Signed)
Occupational Therapy Evaluation Patient Details Name: Jacob Joseph MRN: [id number] DOB: [birth year] Today's Date: 03/09/2012 Time: 4098-1191 OT Time Calculation (min): 19 min  OT Assessment / Plan / Recommendation Clinical Impression  Pt admitted with right side weakness.  Pt with recent admission in 5/13 for CVA.  MRI findings show no acute or subacute infarct.  Will benefit from acute OT services to address below problem list in prep for safe d/c home with spouse.    OT Assessment  Patient needs continued OT Services    Follow Up Recommendations  Supervision/Assistance - 24 hour;Outpatient OT    Barriers to Discharge      Equipment Recommendations  None recommended by OT    Recommendations for Other Services    Frequency  Min 2X/week    Precautions / Restrictions     Pertinent Vitals/Pain Pt reports he has been experiencing back pain for past several days.  Unable to tolerate much activity today due to back pain.    ADL  Upper Body Dressing: Performed;Set up Where Assessed - Upper Body Dressing: Unsupported sitting Toilet Transfer: Simulated;Min guard Toilet Transfer Method: Sit to Barista:  (chair) Equipment Used: Gait belt;Rolling walker Transfers/Ambulation Related to ADLs: Min guard with RW.  Increased time. VC for upright posture. ADL Comments: Pt reports that he has some difficulty during feeding due to right hand weakness but also reports he does not drop utensils or cups.     OT Diagnosis: Generalized weakness;Paresis  OT Problem List: Decreased strength;Decreased activity tolerance;Impaired sensation;Impaired UE functional use OT Treatment Interventions: Self-care/ADL training;Therapeutic activities;Patient/family education;Therapeutic exercise   OT Goals Acute Rehab OT Goals OT Goal Formulation: With patient Time For Goal Achievement: 03/16/12 Potential to Achieve Goals: Good ADL Goals Pt Will Perform Grooming: with modified  independence;Standing at sink (2 tasks) ADL Goal: Grooming - Progress: Goal set today Pt Will Transfer to Toilet: with modified independence;with DME;Ambulation;Comfort height toilet ADL Goal: Toilet Transfer - Progress: Goal set today Arm Goals Additional Arm Goal #1: Pt will incorporate RUE during 75% of ADL activity. Arm Goal: Additional Goal #1 - Progress: Goal set today Additional Arm Goal #2: Pt will independently perform RUE fine motor HEP. Arm Goal: Additional Goal #2 - Progress: Goal set today  Visit Information  Last OT Received On: 03/09/12 Assistance Needed: +1    Subjective Data      Prior Functioning  Vision/Perception  Home Living Lives With: Spouse Available Help at Discharge: Family;Available 24 hours/day Type of Home: House Home Access: Stairs to enter [employer] of Steps: 1 Entrance Stairs-Rails: None Home Layout: One level Bathroom Shower/Tub: Forensic scientist: Standard Bathroom Accessibility: Yes How Accessible: Accessible via walker Home Adaptive Equipment: Bedside commode/3-in-1;Tub transfer bench;Walker - rolling;Straight cane Prior Function Level of Independence: Needs assistance Needs Assistance: Bathing;Dressing Bath: Maximal Dressing: Maximal Comments: Pt reports that his wife helps him with most of his bathing and dressing since his stroke in May, '13.  Question if wife's assistance is due to pt need vs accepting available assistance. Pt has been attending OP therapy over past several weeks. Dominant Hand: Right      Cognition  Overall Cognitive Status: Appears within functional limits for tasks assessed/performed Arousal/Alertness: Awake/alert Orientation Level: Appears intact for tasks assessed Behavior During Session: [facility] for tasks performed    Extremity/Trunk Assessment Right Upper Extremity Assessment RUE ROM/Strength/Tone: Deficits RUE ROM/Strength/Tone Deficits: 3+/5 throughout RUE Sensation:  Deficits RUE Sensation Deficits: Pt reports RUE feels "dull." RUE Coordination: Deficits RUE Coordination  Deficits: Decreased accuracy and speed with FMC. Left Upper Extremity Assessment LUE ROM/Strength/Tone: Within functional levels (4/5 throughout) LUE Sensation: WFL - Light Touch;WFL - Proprioception LUE Coordination: WFL - gross/fine motor   Mobility Bed Mobility Bed Mobility: Supine to Sit;Sitting - Scoot to Edge of Bed Supine to Sit: 5: Supervision;With rails;HOB elevated Sitting - Scoot to Edge of Bed: 5: Supervision;With rail Details for Bed Mobility Assistance: Required increased time. Transfers Transfers: Sit to Stand;Stand to Sit Sit to Stand: 4: Min guard;From bed;With upper extremity assist Stand to Sit: 4: Min guard;To chair/3-in-1;With armrests;With upper extremity assist Details for Transfer Assistance: Increased time to obtain full upright posture.  Min guard for safety,   Exercise    Balance    End of Session OT - End of Session Equipment Utilized During Treatment: Gait belt Activity Tolerance: Patient limited by pain Patient left: in chair;with call bell/phone within reach Nurse Communication: Mobility status  GO    03/09/2012 Cipriano Mile OTR/L Pager [phone] Office [phone]  Cipriano Mile 03/09/2012, 9:55 AM

## 2012-03-09 NOTE — Progress Notes (Addendum)
INITIAL ADULT NUTRITION ASSESSMENT Date: 03/09/2012   Time: 12:22 PM Reason for Assessment: MST score of 2  INTERVENTION: Ensure Complete po BID, each supplement provides 350 kcal and 13 grams of protein. Notify CSW for help with additional resources.   ASSESSMENT: Male 56 y.o.  Dx: Right sided weakness  Hx:  Past Medical History  Diagnosis Date  . Hypertension   . HIV (human immunodeficiency virus infection)   . Depression   . Stroke    Past Surgical History  Procedure Date  . Tee without cardioversion 12/07/2011    Procedure: TRANSESOPHAGEAL ECHOCARDIOGRAM (TEE);  Surgeon: [name], MD;  Location: [facility] ENDOSCOPY;  Service: Cardiovascular;  Laterality: N/A;  . Inguinal hernia repair 01/16/2012    Procedure: HERNIA REPAIR INGUINAL INCARCERATED;  Surgeon: Liz Malady, MD;  Location: [facility] OR;  Service: General;  Laterality: Right;   Related Meds:     . aspirin EC  81 mg Oral Daily  . atorvastatin  10 mg Oral q1800  . clopidogrel  75 mg Oral Daily  . enoxaparin (LOVENOX) injection  40 mg Subcutaneous Q24H  . insulin aspart  0-9 Units Subcutaneous TID WC  . lamiVUDine-zidovudine  1 tablet Oral BID  . lopinavir-ritonavir  2 tablet Oral BID  . mirtazapine  45 mg Oral QHS  . potassium chloride  40 mEq Oral Once  . QUEtiapine  300 mg Oral QHS  . general admission iv infusion   Intravenous Once  . sodium chloride  3 mL Intravenous Q12H  . tenofovir  300 mg Oral Daily   Ht: 6' (182.9 cm)  Wt: 187 lb 13.3 oz (85.2 kg)  Ideal Wt: 79.5 kg % Ideal Wt: 107%  Usual Wt:  Wt Readings from Last 10 Encounters:  03/08/12 187 lb 13.3 oz (85.2 kg)  01/16/12 202 lb 13.2 oz (92 kg)  01/16/12 202 lb 13.2 oz (92 kg)  12/23/11 195 lb 11.2 oz (88.769 kg)  12/21/11 199 lb 8 oz (90.493 kg)  12/04/11 178 lb 9.2 oz (81 kg)  12/04/11 178 lb 9.2 oz (81 kg)  08/24/11 196 lb 8 oz (89.132 kg)  02/10/11 178 lb 8 oz (80.967 kg)  11/03/10 185 lb 8 oz (84.142 kg)   % Usual Wt: 93%  Body  mass index is 25.47 kg/(m^2). Overweight  Food/Nutrition Related Hx: Pt reports a 7% wt loss x 2 month due to limited access to food. He states that he does not qualify for food stamps but does use 2 food banks which he can visit 1 time each month. Per pt he and his wife care for 2 granddaughters.   Labs:  CMP     Component Value Date/Time   NA 139 03/09/2012 0222   K 3.4* 03/09/2012 0222   CL 106 03/09/2012 0222   CO2 22 03/09/2012 0222   GLUCOSE 123* 03/09/2012 0222   BUN 10 03/09/2012 0222   CREATININE 1.06 03/09/2012 0222   CREATININE 1.28 11/30/2011 0928   CALCIUM 8.7 03/09/2012 0222   PROT 8.0 03/08/2012 2106   ALBUMIN 3.2* 03/08/2012 2106   AST 146* 03/08/2012 2106   ALT 127* 03/08/2012 2106   ALKPHOS 100 03/08/2012 2106   BILITOT 0.5 03/08/2012 2106   GFRNONAA 78* 03/09/2012 0222   GFRAA >90 03/09/2012 0222   Sodium  Date/Time Value Range Status  03/09/2012  2:22 AM 139  135 - 145 mEq/L Final  03/08/2012  3:42 PM 139  135 - 145 mEq/L Final  01/18/2012  8:00 PM  138  135 - 145 mEq/L Final    Potassium  Date/Time Value Range Status  03/09/2012  2:22 AM 3.4* 3.5 - 5.1 mEq/L Final  03/08/2012  3:42 PM 3.9  3.5 - 5.1 mEq/L Final  01/18/2012  8:00 PM 3.8  3.5 - 5.1 mEq/L Final    No results found for this basename: phos    Magnesium  Date/Time Value Range Status  03/08/2012  3:42 PM 2.0  1.5 - 2.5 mg/dL Final  1/47/8295  6:21 AM 1.4* 1.5 - 2.5 mg/dL Final    Intake/Output Summary (Last 24 hours) at 03/09/12 1224 Last data filed at 03/09/12 1100  Gross per 24 hour  Intake   1300 ml  Output   1050 ml  Net    250 ml   Diet Order: Heart Healthy  Supplements/Tube Feeding: none  IVF:    Estimated Nutritional Needs:   Kcal:  2100-2300 Protein:  100-115 grams Fluid:  >2.1 L/day  NUTRITION DIAGNOSIS: -Limited Access to Food and/or Water Status:  Ongoing  RELATED TO: lack of financial resources  AS EVIDENCE BY: unintentional weight loss of 7% in 2  months  MONITORING/EVALUATION(Goals): Goal: Prevent further weight loss Monitor: weight, PO intake, labs  EDUCATION NEEDS: -No education needs identified at this time  DOCUMENTATION CODES Per approved criteria  -Not Applicable    Kendell Bane RD, LDN, CNSC [phone] Pager [phone] After Hours Pager  03/09/2012, 12:22 PM

## 2012-03-10 ENCOUNTER — Ambulatory Visit: Payer: Medicaid Other | Admitting: Physical Therapy

## 2012-03-10 DIAGNOSIS — N182 Chronic kidney disease, stage 2 (mild): Secondary | ICD-10-CM

## 2012-03-10 DIAGNOSIS — R5383 Other fatigue: Secondary | ICD-10-CM

## 2012-03-10 DIAGNOSIS — M6281 Muscle weakness (generalized): Secondary | ICD-10-CM

## 2012-03-10 DIAGNOSIS — B20 Human immunodeficiency virus [HIV] disease: Secondary | ICD-10-CM

## 2012-03-10 DIAGNOSIS — I129 Hypertensive chronic kidney disease with stage 1 through stage 4 chronic kidney disease, or unspecified chronic kidney disease: Secondary | ICD-10-CM

## 2012-03-10 LAB — GLUCOSE, CAPILLARY: Glucose-Capillary: 135 mg/dL — ABNORMAL HIGH (ref 70–99)

## 2012-03-10 LAB — URINE MICROSCOPIC-ADD ON

## 2012-03-10 LAB — URINALYSIS, ROUTINE W REFLEX MICROSCOPIC
Bilirubin Urine: NEGATIVE
Ketones, ur: NEGATIVE mg/dL
Nitrite: NEGATIVE
Protein, ur: 30 mg/dL — AB
Urobilinogen, UA: 4 mg/dL — ABNORMAL HIGH (ref 0.0–1.0)
pH: 7 (ref 5.0–8.0)

## 2012-03-10 LAB — COMPREHENSIVE METABOLIC PANEL
ALT: 137 U/L — ABNORMAL HIGH (ref 0–53)
AST: 188 U/L — ABNORMAL HIGH (ref 0–37)
CO2: 22 mEq/L (ref 19–32)
Calcium: 8.8 mg/dL (ref 8.4–10.5)
Chloride: 105 mEq/L (ref 96–112)
Creatinine, Ser: 1.12 mg/dL (ref 0.50–1.35)
GFR calc Af Amer: 84 mL/min — ABNORMAL LOW (ref >90)
GFR calc non Af Amer: 73 mL/min — ABNORMAL LOW (ref >90)
Glucose, Bld: 126 mg/dL — ABNORMAL HIGH (ref 70–99)
Sodium: 137 mEq/L (ref 135–145)
Total Bilirubin: 0.5 mg/dL (ref 0.3–1.2)

## 2012-03-10 LAB — CBC WITH DIFFERENTIAL/PLATELET
Basophils Absolute: 0 10*3/uL (ref 0.0–0.1)
Eosinophils Absolute: 0.3 10*3/uL (ref 0.0–0.7)
Lymphocytes Relative: 50 % — ABNORMAL HIGH (ref 12–46)
MCH: 32.7 pg (ref 26.0–34.0)
MCHC: 34.4 g/dL (ref 30.0–36.0)
Monocytes Absolute: 0.4 10*3/uL (ref 0.1–1.0)
Neutrophils Relative %: 24 % — ABNORMAL LOW (ref 43–77)
Platelets: 198 10*3/uL (ref 150–400)
RDW: 14.4 % (ref 11.5–15.5)

## 2012-03-10 LAB — DRUGS OF ABUSE SCREEN W/O ALC, ROUTINE URINE
Amphetamine Screen, Ur: NEGATIVE
Barbiturate Quant, Ur: NEGATIVE
Benzodiazepines.: NEGATIVE
Cocaine Metabolites: NEGATIVE
Creatinine,U: 132.3 mg/dL
Marijuana Metabolite: NEGATIVE
Methadone: NEGATIVE
Opiate Screen, Urine: NEGATIVE
Phencyclidine (PCP): NEGATIVE
Propoxyphene: NEGATIVE

## 2012-03-10 LAB — HIV-1 RNA QUANT-NO REFLEX-BLD
HIV 1 RNA Quant: <20 copies/mL (ref <20)
HIV-1 RNA Quant, Log: <1.3 {Log} (ref <1.30)

## 2012-03-10 LAB — CARDIAC PANEL(CRET KIN+CKTOT+MB+TROPI): Troponin I: <0.3 ng/mL (ref <0.30)

## 2012-03-10 LAB — HEPATITIS B E ANTIBODY: Hep B E Ab: NEGATIVE

## 2012-03-10 LAB — HCV RNA QUANT: HCV Quantitative Log: 7.02 {Log} — ABNORMAL HIGH (ref <1.63)

## 2012-03-10 MED ORDER — LISINOPRIL 10 MG PO TABS: 10.0000 mg | ORAL_TABLET | Freq: Every day | ORAL | Status: DC

## 2012-03-10 MED ORDER — CYCLOBENZAPRINE HCL 5 MG PO TABS: 5.0000 mg | ORAL_TABLET | Freq: Three times a day (TID) | ORAL | Status: DC | PRN

## 2012-03-10 MED ORDER — MIRTAZAPINE 30 MG PO TABS
30.0000 mg | ORAL_TABLET | Freq: Every day | ORAL | Status: DC
  Administered 2012-03-10: 30 mg via ORAL
  Filled 2012-03-10 (×3): qty 1

## 2012-03-10 MED ORDER — ASPIRIN 81 MG PO TBEC: 325.0000 mg | DELAYED_RELEASE_TABLET | Freq: Every day | ORAL | Status: AC

## 2012-03-10 MED ORDER — QUETIAPINE FUMARATE 200 MG PO TABS
200.0000 mg | ORAL_TABLET | Freq: Every day | ORAL | Status: DC
  Administered 2012-03-10: 200 mg via ORAL
  Filled 2012-03-10 (×2): qty 1

## 2012-03-10 MED ORDER — METOPROLOL TARTRATE 25 MG PO TABS
25.0000 mg | ORAL_TABLET | Freq: Two times a day (BID) | ORAL | Status: DC
  Administered 2012-03-10 – 2012-03-11 (×2): 25 mg via ORAL
  Filled 2012-03-10 (×4): qty 1

## 2012-03-10 MED ORDER — NICOTINE 14 MG/24HR TD PT24: 1.0000 | MEDICATED_PATCH | Freq: Every day | TRANSDERMAL | Status: AC

## 2012-03-10 NOTE — Care Management Note (Signed)
    Page 1 of 1   03/10/2012     12:46:12 PM   CARE MANAGEMENT NOTE 03/10/2012  Patient:  [facility]   Account Number:  [id number]  Date Initiated:  03/10/2012  Documentation initiated by:  Onnie Boer  Subjective/Objective Assessment:   PT WAS ADMITTED WITH WEAKNESS     Action/Plan:   PROGRESSION OF CARE AND DISCHARGE PLANNING   Anticipated DC Date:  03/11/2012   Anticipated DC Plan:  HOME/SELF CARE      DC Planning Services  CM consult      Choice offered to / List presented to:             Status of service:  In process, will continue to follow Medicare Important Message given?   (If response is "NO", the following Medicare IM given date fields will be blank) Date Medicare IM given:   Date Additional Medicare IM given:    Discharge Disposition:    Per UR Regulation:  Reviewed for med. necessity/level of care/duration of stay  If discussed at Long Length of Stay Meetings, dates discussed:    Comments:  03/10/12 Onnie Boer, RN, BSN 1245 PT CURRENTLY WITH NEURO REHAB FOR THERAPIES.  WILL F/U.  03/10/12 Onnie Boer, RN, BSN 1101 PT WAS ADMITTED WITH WEAKNESS.  PTA PT WAS AT HOME WITH HIS FAMILY. PT/OT RECOMMENDATIONS ARE FOR OP PT.  WILL F/U.

## 2012-03-10 NOTE — Progress Notes (Signed)
I saw patient and discussed his care on a.m. rounds with house staff.  He is doing better, reports improved speech; right sided strength is stable to improved.  Agree with plan to discharge home on aspirin as per Dr. Pearlean Brownie with outpatient followup.

## 2012-03-10 NOTE — Progress Notes (Signed)
Pt had 6 min run of vtach. Md made aware. Pt was up and moving around. Pt under no s/s distress. Will monitor. Strips placed in chart.

## 2012-03-10 NOTE — Progress Notes (Signed)
Resident Co-sign Daily Note: I have seen the patient and reviewed the daily progress note by Kathrin Ruddy MS IV and discussed the care of the patient with them.  See below for documentation of my findings, assessment, and plans.  Subjective: States he is doing well.  Weakness is improved and his slurring is much improved.  Eating well and had a BM yesterday.  No acute events overnight.    Objective: Vital signs in last 24 hours: Filed Vitals:   03/10/12 1418 03/10/12 2200 03/10/12 2203 03/10/12 2205  BP: 134/83 146/86 140/98 125/89  Pulse: 95 96 110 112  Temp: 98.3 F (36.8 C) 99.4 F (37.4 C)    TempSrc:  Oral    Resp: 20 18    Height:      Weight:      SpO2: 100% 98%     Physical Exam: Vitals reviewed. General: resting in bed, NAD HEENT: PERRL, EOMI, no scleral icterus Cardiac: RRR, no rubs, murmurs or gallops Pulm: clear to auscultation bilaterally, no wheezes, rales, or rhonchi Abd: soft, nontender, nondistended, BS present Ext: warm and well perfused, no pedal edema Neuro: alert and oriented X3, cranial nerves II-XII grossly intact, sensation to light touch equal in bilateral upper and lower extremities except with the loss of sensation to light touch on right lateral foot.  Strength in the right and left upper and lower extremities are 5/5 bilaterally.  There is moderate atrophy in the thenar, hypothenar, and interosseus of the right hand as well as the quadriceps near the right knee.    Lab Results: Reviewed and documented in Electronic Record Micro Results: Reviewed and documented in Electronic Record Studies/Results: Reviewed and documented in Electronic Record Medications: I have reviewed the patient's current medications. Scheduled Meds:   . aspirin EC  81 mg Oral Daily  . atorvastatin  10 mg Oral q1800  . clopidogrel  75 mg Oral Daily  . enoxaparin (LOVENOX) injection  40 mg Subcutaneous Q24H  . feeding supplement  237 mL Oral BID BM  . insulin aspart  0-9  Units Subcutaneous TID WC  . lamiVUDine-zidovudine  1 tablet Oral BID  . lisinopril  10 mg Oral Daily  . lopinavir-ritonavir  2 tablet Oral BID  . metoprolol tartrate  25 mg Oral BID  . mirtazapine  30 mg Oral QHS  . nicotine  14 mg Transdermal Daily  . QUEtiapine  200 mg Oral QHS  . general admission iv infusion   Intravenous Once  . sodium chloride  3 mL Intravenous Q12H  . tenofovir  300 mg Oral Daily  . DISCONTD: mirtazapine  45 mg Oral QHS  . DISCONTD: QUEtiapine  300 mg Oral QHS   Continuous Infusions:  PRN Meds:.acetaminophen, acetaminophen, ondansetron (ZOFRAN) IV, ondansetron, oxyCODONE  Assessment/Plan: 1.  Right sided weakness: On admission he presented with a story of increasing weakness on his right side initially concerning for a recurrence of a stroke or extension of the original stroke.  He has improved from his presentation with resolution of slurred speech and increasing strength in the right sided extremities.  PT and OT have seen him and recommended home PT and OT.  CT and MRI were negative for acute infarction.  Neurology has seen and suggested continuing ASA 325 and stopping plavix as well as cessation of cocaine and tobacco.    - Likely D/C today.  2. HIV infection:   CD4 count is 540 and WBC has been mildly trending downward.  We will continue his  home HIV medications during this admission.   3.  Transaminitis:  On admission his elevated ALT/AST of 127/146.  During his last admission they were 38/54.  At that stay he was started on Lipitor 10 mg even though his LDL was in the 50s.  He also is on Viread for his HIV and he also has a history of HCV.    - plan to d/c statin   - recheck at his follow up.  4.  HTN: he was restarted on lisinopril 10 mg daily and will need continued follow up as an outpatient with goal of BP <140/80.  5.  Polysubstance abuse:  UDS was negative but admits to cocaine use 1.5 weeks ago.  He also smokes cigarettes.  He was encouraged to  continue cessation from both to limit his risk for repeat stroke.   7. Insomnia/Depression:  He has a long history and has been on medications at their current doses.    8.  CKD stage II:  Currently at his baseline.   9.  Chronic Back pain: Will likely D/C on Flexeril.  10.  Dispo:  Plan to D/C home today with follow up with Neuro, ID, and OPC.    LOS: 2 days   Skyler Dusing 03/10/2012, 5:43 PM   Addendum:  Called by nursing because of a 6 minute run of a tachy arrythmia.  Reviewed the tele recording and it was a narrow complex tachycardia, either SVT vs. A flutter vs. Sinus tachycardia.  See my note from this evening.  D/C cancelled for this evening and we will follow up Dr. Roseanne Kaufman recommendations.   Teighan Aubert Internal Medicine Resident Pager: [phone] 03/10/2012 11:29 PM

## 2012-03-10 NOTE — Consult Note (Signed)
Subjective:  Asymptomatic. Here for stroke type symptoms. Monitor showed frequent episodes of sinus tachycardia ranging from low 100 to 140 +. Noisy baseline on upper lead makes rhythm look like atrial flutter but lower lead ha clean tracing supporting sinus tachycardia.  Objective:  Vital Signs in the last 24 hours: Temp:  [98.1 F (36.7 C)-98.9 F (37.2 C)] 98.3 F (36.8 C) (08/22 1418) Pulse Rate:  [73-95] 95  (08/22 1418) Cardiac Rhythm:  [-] Normal sinus rhythm (08/22 0840) Resp:  [20] 20  (08/22 1418) BP: (126-144)/(83-92) 134/83 mmHg (08/22 1418) SpO2:  [98 %-100 %] 100 % (08/22 1418)  Physical Exam: BP Readings from Last 1 Encounters:  03/10/12 134/83    Wt Readings from Last 1 Encounters:  03/08/12 187 lb 13.3 oz (85.2 kg)    Weight change:   HEENT: [place]/AT, Eyes-Brown, PERL, EOMI, Conjunctiva-Pink, Sclera-Non-icteric Neck: No JVD, No bruit, Trachea midline. Lungs:  Clear, Bilateral. Cardiac:  Regular rhythm, normal S1 and S2, no S3.  Abdomen:  Soft, non-tender. Extremities:  No edema present. No cyanosis. No clubbing. CNS: AxOx3, Cranial nerves grossly intact, moves all 4 extremities. Right handed. Right sided weakness. Skin: Warm and dry.   Intake/Output from previous day: 08/21 0701 - 08/22 0700 In: 1020 [P.O.:1020] Out: 2250 [Urine:2250]    Lab Results: BMET    Component Value Date/Time   NA 137 03/10/2012 0713   K 3.7 03/10/2012 0713   CL 105 03/10/2012 0713   CO2 22 03/10/2012 0713   GLUCOSE 126* 03/10/2012 0713   BUN 10 03/10/2012 0713   CREATININE 1.12 03/10/2012 0713   CREATININE 1.28 11/30/2011 0928   CALCIUM 8.8 03/10/2012 0713   GFRNONAA 73* 03/10/2012 0713   GFRAA 84* 03/10/2012 0713   CBC    Component Value Date/Time   WBC 2.6* 03/10/2012 0713   RBC 3.70* 03/10/2012 0713   HGB 12.1* 03/10/2012 0713   HCT 35.2* 03/10/2012 0713   PLT 198 03/10/2012 0713   MCV 95.1 03/10/2012 0713   MCH 32.7 03/10/2012 0713   MCHC 34.4 03/10/2012 0713   RDW 14.4  03/10/2012 0713   LYMPHSABS 1.3 03/10/2012 0713   MONOABS 0.4 03/10/2012 0713   EOSABS 0.3 03/10/2012 0713   BASOSABS 0.0 03/10/2012 0713   CARDIAC ENZYMES Lab Results  Component Value Date   CKTOTAL 229 03/09/2012   CKMB 2.3 03/09/2012   TROPONINI <0.30 03/09/2012    Assessment/Plan:  Patient Active Hospital Problem List: Sinus tachycardia Most likely due to Remeron and Quetiapine use. Decrease dose of above medications if possible. Add B-blocker as tolerated. Other diagnosis Right sided weakness (03/08/2012) HIV INFECTION (04/27/2006) DIABETES MELLITUS, TYPE II (08/13/2009) TOBACCO USER (04/27/2006) Polysubstance abuse (04/27/2006) INSOMNIA, CHRONIC (06/14/2007) ESSENTIAL HYPERTENSION (04/27/2006) CVA (cerebral infarction) (12/03/2011)  Normocytic anemia (03/08/2012) Leukopenia (03/08/2012) due to HIV medications CKD (chronic kidney disease), stage II (03/08/2012) Elevated LFTs (03/09/2012) Hypokalemia (03/09/2012)- Resolved Chronic back pain (03/09/2012)    LOS: 2 days    Orpah Cobb  MD  03/10/2012, 7:14 PM

## 2012-03-10 NOTE — Progress Notes (Signed)
Stroke Team Progress Note  HISTORY  Jacob Joseph is an 56 y.o. male who has had intermittent RUE/RLE weakness (more than from baseline from stroke in 11/2011). In addition, wife states that he has looked "slumped over" for 2 days with intermittent drooling which would only last a few seconds. He has been able to eat without problem. Yesterday when he tried to go into the bathroom at home, he fell due to right sided weakness, hitting his head. Denies LOC. At that time, she decided that he needed to come to be evaluated.   In May 2013 MRI revealed: Acute infarcts in the left parietal white matter and left superior temporal cortex with probable subacute infarcts in the deep white matter on the right in a watershed distribution. 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 Left M1 segment occluded. At that time he was on no antiplatelet agents at home and for stroke prevention was placed on baby asa 81mg  daily + Plavix. The plavix was recommended for 3 mos only. He is still taking his plavix.     SUBJECTIVE He is lying in bed . Overall he feels his condition is rapidly improving. No new complaints  OBJECTIVE Most recent Vital Signs: Filed Vitals:   03/09/12 1802 03/09/12 2142 03/10/12 0140 03/10/12 0517  BP: 157/99 139/92 144/86 126/83  Pulse: 72 76 73 87  Temp: 98 F (36.7 C) 98.9 F (37.2 C) 98.2 F (36.8 C) 98.1 F (36.7 C)  TempSrc: Oral Oral Oral Oral  Resp: 18 20 20 20   Height:      Weight:      SpO2: 100% 98% 99% 98%   CBG (last 3)   Basename 03/10/12 0700 03/09/12 2145 03/09/12 1631  GLUCAP 135* 110* 79   Intake/Output from previous day: 08/21 0701 - 08/22 0700 In: 1020 [P.O.:1020] Out: 2250 [Urine:2250]  IV Fluid Intake:     MEDICATIONS    . aspirin EC  81 mg Oral Daily  . atorvastatin  10 mg Oral q1800  . clopidogrel  75 mg Oral Daily  . enoxaparin (LOVENOX) injection  40  mg Subcutaneous Q24H  . feeding supplement  237 mL Oral BID BM  . insulin aspart  0-9 Units Subcutaneous TID WC  . lamiVUDine-zidovudine  1 tablet Oral BID  . lisinopril  10 mg Oral Daily  . lopinavir-ritonavir  2 tablet Oral BID  . mirtazapine  45 mg Oral QHS  . nicotine  14 mg Transdermal Daily  . potassium chloride  40 mEq Oral Once  . QUEtiapine  300 mg Oral QHS  . general admission iv infusion   Intravenous Once  . sodium chloride  3 mL Intravenous Q12H  . tenofovir  300 mg Oral Daily   PRN:  acetaminophen, acetaminophen, ondansetron (ZOFRAN) IV, ondansetron, oxyCODONE  Diet:  Cardiac Activity:  Up with assistance DVT Prophylaxis:  lovenox  CLINICALLY SIGNIFICANT STUDIES Basic Metabolic Panel:  Lab 03/10/12 1610 03/09/12 0222 03/08/12 1542  NA 137 139 --  K 3.7 3.4* --  CL 105 106 --  CO2 22 22 --  GLUCOSE 126* 123* --  BUN 10 10 --  CREATININE 1.12 1.06 --  CALCIUM 8.8 8.7 --  MG -- -- 2.0  PHOS -- -- --   Liver Function Tests:  Lab 03/10/12 0713 03/08/12 2106  AST 188* 146*  ALT 137* 127*  ALKPHOS 94 100  BILITOT 0.5 0.5  PROT 7.3  8.0  ALBUMIN 2.9* 3.2*   CBC:  Lab 03/10/12 0713 03/08/12 1455  WBC 2.6* 3.9*  NEUTROABS 0.6* 1.0*  HGB 12.1* 12.6*  HCT 35.2* 38.0*  MCV 95.1 98.2  PLT 198 197   Cardiac Enzymes:  Lab 03/09/12 0915 03/09/12 0222 03/08/12 2046  CKTOTAL 229 268* 300*  CKMB 2.3 2.3 2.7  CKMBINDEX -- -- --  TROPONINI <0.30 <0.30 <0.30   Urinalysis:  Lab 03/10/12 0300 03/08/12 1415  COLORURINE AMBER* AMBER*  LABSPEC 1.018 1.022  PHURINE 7.0 7.0  GLUCOSEU NEGATIVE NEGATIVE  HGBUR NEGATIVE TRACE*  BILIRUBINUR NEGATIVE NEGATIVE  KETONESUR NEGATIVE NEGATIVE  PROTEINUR 30* 30*  UROBILINOGEN 4.0* 2.0*  NITRITE NEGATIVE NEGATIVE  LEUKOCYTESUR NEGATIVE TRACE*   Lipid Panel    Component Value Date/Time   CHOL 83 03/09/2012 0222   TRIG 107 03/09/2012 0222   HDL 43 03/09/2012 0222   CHOLHDL 1.9 03/09/2012 0222   VLDL 21 03/09/2012 0222    LDLCALC 19 03/09/2012 0222   HgbA1C  Lab Results  Component Value Date   HGBA1C 5.9* 03/08/2012    Urine Drug Screen:     Component Value Date/Time   LABOPIA NEGATIVE 03/09/2012 0741   LABOPIA NONE DETECTED 12/03/2011 1638   COCAINSCRNUR NEGATIVE 03/09/2012 0741   COCAINSCRNUR POSITIVE* 12/03/2011 1638   LABBENZ NEGATIVE 03/09/2012 0741   LABBENZ NEG 12/23/2011 0955   LABBENZ NONE DETECTED 12/03/2011 1638   AMPHETMU NEGATIVE 03/09/2012 0741   AMPHETMU NONE DETECTED 12/03/2011 1638   THCU NONE DETECTED 12/03/2011 1638   LABBARB NEG 12/23/2011 0955   LABBARB NONE DETECTED 12/03/2011 1638    Dg Chest 2 View  03/08/2012 Bibasilar atelectasis.   Ct Head Wo Contrast  03/08/2012 1. No acute intracranial abnormality by noncontrast CT scan. 2. Expected evolution of previously identified infarcts in the periventricular white matter of the left posterior frontal and parietal lobes, and subcortical white matter of the right frontal watershed region. 3. Age advanced cerebral and cerebellar volume loss 4. Intracranial atherosclerosis   [name] Head/Brain Wo Contrast  03/09/2012 No acute infarction. Scattered old small vessel infarctions affecting the cerebral hemispheres that could relate to chronic small vessel disease or small embolic infarctions. No change since the previous study. Complete occlusion of the left supraclinoid internal carotid artery after the fetal origin of the left PCA.    Carotid Doppler No significant extracranial carotid artery stenosis demonstrated. Vertebrals are patent with antegrade flow  EKG  NSR  Therapy Recommendations PT - ; OT rec 24 hour supervision; ST -  Physical Exam  Obese middle aged Caucasian male not in distress.Awake alert. Afebrile. Head is nontraumatic. Neck is supple without bruit. Hearing is normal. Cardiac exam no murmur or gallop. Lungs are clear to auscultation. Distal pulses are well felt.  Neurological Exam : Awake alert oriented x 3 normal speech and  languageFundi not visualized. Visual fields and acuity are normal.. Mild right lower face asymmetry. Tongue midline. No drift. Mild diminished fine finger movements on right. Orbits left over right upper extremity. Mild right  grip weak.. Normal sensation . Impaired coordination on right.  ASSESSMENT Jacob Joseph is a 56 y.o. male presenting with right sided weakness. Imaging confirms a previous left posterior frontal and parietal lobes, infarct. Infarct.MRI shows no acute infarct On aspirin 81 mg orally every day and clopidogrel 75 mg orally every day prior to admission. Now on aspirin 81 mg orally every day and clopidogrel 75 mg orally every day for secondary stroke prevention. Patient  with resultant right sided weakness which has improved since admission, although he has had remaining right sided weakness since stroke in May 2013.  -Polysubstance abuse history -HIV -Hypertension, recommend aggressive treatment, SBP>160 noted. -Diabetes Mellitus, HgbA1c 5.9 -Hyperlipidemia, LDL 19 -Elevated transaminases, per primary team  Hospital day # 2  TREATMENT/PLAN -Change to aspirin 325 mg orally every day for secondary stroke prevention. May discontinue Plavix as he has completed the 3 month course due to previous stroke and found at that time complete occlusion of the left supraclinoid internal carotid artery after the fetal origin of the left PCA.  -Aggressive risk factor management -Activity as recommended by therapy services. -Stroke Service will sign off. Follow up with Dr. Pearlean Brownie in 2 months.  Guy Franco, [facility],  MBA, MHA [facility] Pager: [phone] 03/10/2012 10:19 AM  Scribe for Dr. Delia Heady, Stroke Center Medical Director. He has personally reviewed chart, pertinent data, examined the patient and developed the plan of care. Pager:  [phone]

## 2012-03-10 NOTE — Progress Notes (Signed)
Physical Therapy Treatment Patient Details Name: Jacob Joseph MRN: [id number] DOB: July 09, 1956 Today's Date: 03/10/2012 Time: 4098-1191 PT Time Calculation (min): 21 min  PT Assessment / Plan / Recommendation Comments on Treatment Session  Progressing well. Continue with POC    Follow Up Recommendations  Outpatient PT;Supervision/Assistance - 24 hour    Barriers to Discharge        Equipment Recommendations  None recommended by PT    Recommendations for Other Services    Frequency Min 3X/week   Plan Discharge plan remains appropriate;Frequency remains appropriate    Precautions / Restrictions Precautions Precautions: Fall   Pertinent Vitals/Pain     Mobility  Bed Mobility Supine to Sit: 6: Modified independent (Device/Increase time) Transfers Sit to Stand: 5: Supervision Stand to Sit: 5: Supervision Details for Transfer Assistance: Cues for safe hand placement Ambulation/Gait Ambulation/Gait Assistance: 5: Supervision Ambulation Distance (Feet): 400 Feet Assistive device: Rolling walker Ambulation/Gait Assistance Details: Cues for upright posture. Patient with good safety awareness with RW Gait Pattern: Step-through pattern;Trunk flexed Stairs: Yes Stairs Assistance: 5: Supervision Stair Management Technique: Step to pattern;Forwards;With walker Number of Stairs: 1  (prqacticed twice)    Exercises     PT Diagnosis:    PT Problem List:   PT Treatment Interventions:     PT Goals Acute Rehab PT Goals PT Goal: Supine/Side to Sit - Progress: Met PT Goal: Sit to Stand - Progress: Progressing toward goal PT Goal: Stand to Sit - Progress: Progressing toward goal PT Goal: Ambulate - Progress: Progressing toward goal  Visit Information  Last PT Received On: 03/10/12 Assistance Needed: +1    Subjective Data      Cognition  Overall Cognitive Status: Appears within functional limits for tasks assessed/performed Arousal/Alertness: Awake/alert Orientation Level:  Appears intact for tasks assessed Behavior During Session: [facility] for tasks performed    Balance     End of Session PT - End of Session Equipment Utilized During Treatment: Gait belt Activity Tolerance: Patient tolerated treatment well Patient left: in chair;with call bell/phone within reach Nurse Communication: Mobility status   GP     Fredrich Birks 03/10/2012, 3:03 PM 03/10/2012 Fredrich Birks PTA [phone] pager [phone] office

## 2012-03-10 NOTE — Progress Notes (Signed)
Subjective:  Called by nursing because of a 6 minute run of tachycardia while the patient was up in the bathroom brushing his teeth and washing his face.  She was notified by the Tele tech and states that she checked on the patient and he was asymptomatic.  He currently denies chest pain, shortness of breath, nausea, vomiting, or diaphoresis.  He states that when she came to check on him he was feeling mildly lightheaded and was also having a feeling of a rapid heart beat.    Objective:  Filed Vitals:   03/10/12 0140 03/10/12 0517 03/10/12 1025 03/10/12 1418  BP: 144/86 126/83 131/86 134/83  Pulse: 73 87 84 95  Temp: 98.2 F (36.8 C) 98.1 F (36.7 C) 98.3 F (36.8 C) 98.3 F (36.8 C)  TempSrc: Oral Oral    Resp: 20 20  20   Height:      Weight:      SpO2: 99% 98% 98% 100%   Vitals reviewed. General: resting in bed, NAD HEENT: PERRL, EOMI, no scleral icterus Cardiac: mildly tachycardic, no rubs, murmurs or gallops Pulm: clear to auscultation bilaterally, no wheezes, rales, or rhonchi Abd: soft, nontender, nondistended, BS present Ext: warm and well perfused, no pedal edema Neuro: alert and oriented X3, cranial nerves II-XII grossly intact, strength and sensation to light touch equal in bilateral upper and lower extremities  Assessment:   1.  Narrow complex tachycardia:  We reviewed the telemetry strips and noted they appear to be a narrow complex tachycardia and not V. Tach as listed in Nurse Barnes's note from this evening.  There is a lot of interference with the baseline so it is hard to tell if this is SVT vs A. Flutter vs Sinus tachycardia.  The patient is currently asymptomatic and was set for discharge today.   Plan:   1.  He was previously seen by Dr. Algie Coffer and Dr. [name] during his stay in May when he underwent a TEE.  We will recheck his EKG, cycle cardiac enzymes, check orthostatic blood pressure, and consider giving him fluids if they are positive.  We will have Dr.  Algie Coffer come by and have him review the strips and see if he has any other recommendations for now.  We will have to hold his discharge for today.   Eveny Anastas Internal Medicine Resident Pager: [phone] 03/10/2012 6:11 PM

## 2012-03-10 NOTE — ED Provider Notes (Signed)
I saw and evaluated the patient, reviewed the resident's note and I agree with the findings and plan.   Loren Racer, MD 03/10/12 0630

## 2012-03-10 NOTE — Progress Notes (Addendum)
Clinical Social Work Department BRIEF PSYCHOSOCIAL ASSESSMENT 03/10/2012  Patient:  [facility]     Account Number:  [id number]     Admit date:  03/08/2012  Clinical Social Worker:  Conley Simmonds  Date/Time:  03/10/2012 03:00 PM  Referred by:  Physician  Date Referred:  03/09/2012 Referred for  Other - See comment   Other Referral:   Financial concerns   Interview type:  Patient Other interview type:    PSYCHOSOCIAL DATA Living Status:  FAMILY Admitted from facility:   Level of care:   Primary support name:  [name] [phone] Primary support relationship to patient:  FAMILY Degree of support available:   Strained due to resources    CURRENT CONCERNS Current Concerns  Financial Resources   Other Concerns:    SOCIAL WORK ASSESSMENT / PLAN CSW met with pt at bedside to discuss SA and Financial resources.  Pt denies current SA and is open about hx of SA-Pt relays that his main concern is financial resources for food and medication.  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  CSW and patient discussed financial counseling with regards to medication management as well as food and housing-  CSW provided pt with resources with regards to hot meals/medications/Food Pantries-  CSW provided pt with contact information for wife in the [place] that wife has any further questions or concerns   Assessment/plan status:  Psychosocial Support/Ongoing Assessment of Needs Other assessment/ plan:   Information/referral to community resources:   Photographer Meals/Financial Resources    PATIENT'S/FAMILY'S RESPONSE TO PLAN OF CARE: Pt very appreciative of CSW services-Pt unaware of emergency financial assistance in the county  and was very interested in having wife speak with SW as wife tends to manage finances-  Pt appeared very willing to review budget and utilize resources in order to free up petty cash for medication and other assistance-  CSW will f/u with pt and family PRN-  Trula Ore Almir Botts-MSW,LCSW-[phone]

## 2012-03-10 NOTE — Progress Notes (Signed)
Medical Student Daily Progress Note  Subjective: Jacob Joseph reports that he is doing well today. He is not slurring his speech as much as on admission, and his strength has improved. His appetite is normal, and he had a BM yesterday. He says his mood has been good during this hospitalization. No complaints. No acute events overnight. Objective: Vital signs in last 24 hours: Filed Vitals:   03/10/12 0140 03/10/12 0517 03/10/12 1025 03/10/12 1418  BP: 144/86 126/83 131/86 134/83  Pulse: 73 87 84 95  Temp: 98.2 F (36.8 C) 98.1 F (36.7 C) 98.3 F (36.8 C) 98.3 F (36.8 C)  TempSrc: Oral Oral    Resp: 20 20  20   Height:      Weight:      SpO2: 99% 98% 98% 100%   Weight change:   Intake/Output Summary (Last 24 hours) at 03/10/12 1511 Last data filed at 03/10/12 1300  Gross per 24 hour  Intake   1180 ml  Output   1650 ml  Net   -470 ml   Physical Exam: Vitals reviewed. General: resting in bed, NAD HEENT: PERRL, EOMI, no scleral icterus. Mild right facial droop Cardiac: RRR, no rubs, murmurs or gallops Pulm: clear to auscultation bilaterally, no wheezes, rales, or rhonchi Abd: soft, nontender, nondistended, BS present Ext: warm and well perfused, no pedal edema Neuro: alert and oriented X3, cranial nerves II-XII grossly intact, strength equal in bilateral upper and lower extremities. See thenar, hypothenar, and interosseus atrophy on right hand and atrophy of muscles around the right knee. Loss of sensation to light touch on right lateral foot, sensation otherwise intact.  Lab Results: Basic Metabolic Panel:  Lab 03/10/12 1610 03/09/12 0222 03/08/12 1542  NA 137 139 --  K 3.7 3.4* --  CL 105 106 --  CO2 22 22 --  GLUCOSE 126* 123* --  BUN 10 10 --  CREATININE 1.12 1.06 --  CALCIUM 8.8 8.7 --  MG -- -- 2.0  PHOS -- -- --   Liver Function Tests:  Lab 03/10/12 0713 03/08/12 2106  AST 188* 146*  ALT 137* 127*  ALKPHOS 94 100  BILITOT 0.5 0.5  PROT 7.3 8.0    ALBUMIN 2.9* 3.2*   CBC:  Lab 03/10/12 0713 03/08/12 1455  WBC 2.6* 3.9*  NEUTROABS 0.6* 1.0*  HGB 12.1* 12.6*  HCT 35.2* 38.0*  MCV 95.1 98.2  PLT 198 197   CBG:  Lab 03/10/12 1119 03/10/12 0700 03/09/12 2145 03/09/12 1631 03/09/12 1118 03/09/12 0658  GLUCAP 127* 135* 110* 79 112* 97   Urine Drug Screen: Drugs of Abuse     Component Value Date/Time   LABOPIA NEGATIVE 03/09/2012 0741   LABOPIA NONE DETECTED 12/03/2011 1638   COCAINSCRNUR NEGATIVE 03/09/2012 0741   COCAINSCRNUR POSITIVE* 12/03/2011 1638   LABBENZ NEGATIVE 03/09/2012 0741   LABBENZ NEG 12/23/2011 0955   LABBENZ NONE DETECTED 12/03/2011 1638   AMPHETMU NEGATIVE 03/09/2012 0741   AMPHETMU NONE DETECTED 12/03/2011 1638   THCU NONE DETECTED 12/03/2011 1638   LABBARB NEG 12/23/2011 0955   LABBARB NONE DETECTED 12/03/2011 1638    Alcohol Level: No results found for this basename: ETH:2 in the last 168 hours Urinalysis:  Lab 03/10/12 0300 03/08/12 1415  COLORURINE AMBER* AMBER*  LABSPEC 1.018 1.022  PHURINE 7.0 7.0  GLUCOSEU NEGATIVE NEGATIVE  HGBUR NEGATIVE TRACE*  BILIRUBINUR NEGATIVE NEGATIVE  KETONESUR NEGATIVE NEGATIVE  PROTEINUR 30* 30*  UROBILINOGEN 4.0* 2.0*  NITRITE NEGATIVE NEGATIVE  LEUKOCYTESUR NEGATIVE TRACE*  Medications: I have reviewed the patient's current medications. Scheduled Meds:   . aspirin EC  81 mg Oral Daily  . atorvastatin  10 mg Oral q1800  . clopidogrel  75 mg Oral Daily  . enoxaparin (LOVENOX) injection  40 mg Subcutaneous Q24H  . feeding supplement  237 mL Oral BID BM  . insulin aspart  0-9 Units Subcutaneous TID WC  . lamiVUDine-zidovudine  1 tablet Oral BID  . lisinopril  10 mg Oral Daily  . lopinavir-ritonavir  2 tablet Oral BID  . mirtazapine  45 mg Oral QHS  . nicotine  14 mg Transdermal Daily  . QUEtiapine  300 mg Oral QHS  . general admission iv infusion   Intravenous Once  . sodium chloride  3 mL Intravenous Q12H  . tenofovir  300 mg Oral Daily   Continuous  Infusions:  PRN Meds:.acetaminophen, acetaminophen, ondansetron (ZOFRAN) IV, ondansetron, oxyCODONE Assessment/Plan: Principal Problem:  *Right sided weakness: His weakness and facial droop seemed to have improved since admission. His negative workup indicates there is little that can be done for him in the hospital. The etiology of this process may be 2/2 vasospasm with hypertension. He has not returned to his baseline strength prior to the episode; it is unclear if this will be his new baseline or if rehab can bring him back to his old functional status. - Home PT/OT rehab: 24 hr assistance with outpatient OT 2x/wk minimum - can d/c to home today  Active Problems:  HIV INFECTION: WBC trending down (3.9-2.6). CD4 count was a reassuring 540. - continue home HIV meds  Transaminitis: Admission labs for Jacob Joseph showed an elevated ALT/AST of 127/146 respectively. On 12/03/11, his ALT/AST levels were 38/64 respectively. The levels taken on May were before he was started on atorvastatin 10 mg QDay.  - His extremely low LDL levels, elevated transaminases, presence of active HCV, and taking Viread (a medication known for hepatotoxicity) led the team to determine the risk associated with taking a statin exceeded the benefit of the anti-inflammatory properties of statins. -  Jacob Joseph' LFTs should be repeated on an outpatient basis to reassess liver function.  -He had a negative HBV antigen and antibody during this hospitalization in addition to a hospitalization in 2008. He should receive an HBV vaccination on an outpatient basis.   ESSENTIAL HYPERTENSION: Jacob Joseph' blood pressures have averaged 140s systolic and 90s-100s diastolic. The team restarted his original lisinopril 20 mg PO QDay for bp control and renal protection (pt is diabetic).   Polysubstance abuse: UDS was negative, pt admits the last time using cocaine to be ~1.5 wks ago. - Continue encouraging substance cessation, the team has  been educating the patient on the risks associated with cocaine abuse.  INSOMNIA/Depression: Pt has had no issues with insomnia or depression during his stay. - Continue current meds, d/c on home meds.   CKD (chronic kidney disease), stage II: Patient is currently at his baseline BUN/Cr and GFR. These values along with no clinical symptoms indicate that this is a chronic intra-renal process.   TOBACCO USER: 1/2 PPD smoker, expressed interest in quitting. Sending patient home with nicoteine patch   Chronic back pain: Well-controlled on current regimen. - Consider d/cing home with oxycodon 5 mg or flexeril  Dispo: Patient can go home today - f/u appts set with Dr. Pearlean Brownie (neuro), Dr. Orvan Falconer (ID), and Dr. Eben Burow (medicine).   LOS: 2 days   This is a Psychologist, occupational Note.  The care of the  patient was discussed with Dr. Tonny Branch and the assessment and plan formulated with their assistance.  Please see their attached note for official documentation of the daily encounter.  Luretha Rued 03/10/2012, 3:11 PM

## 2012-03-11 DIAGNOSIS — I69992 Facial weakness following unspecified cerebrovascular disease: Secondary | ICD-10-CM

## 2012-03-11 DIAGNOSIS — R29898 Other symptoms and signs involving the musculoskeletal system: Secondary | ICD-10-CM

## 2012-03-11 DIAGNOSIS — I69998 Other sequelae following unspecified cerebrovascular disease: Principal | ICD-10-CM

## 2012-03-11 DIAGNOSIS — I951 Orthostatic hypotension: Secondary | ICD-10-CM

## 2012-03-11 DIAGNOSIS — F329 Major depressive disorder, single episode, unspecified: Secondary | ICD-10-CM

## 2012-03-11 LAB — CBC WITH DIFFERENTIAL/PLATELET
Basophils Absolute: 0 10*3/uL (ref 0.0–0.1)
HCT: 36.6 % — ABNORMAL LOW (ref 39.0–52.0)
Lymphocytes Relative: 48 % — ABNORMAL HIGH (ref 12–46)
Neutro Abs: 1.3 10*3/uL — ABNORMAL LOW (ref 1.7–7.7)
Platelets: 199 10*3/uL (ref 150–400)
RDW: 14.7 % (ref 11.5–15.5)
WBC: 4.3 10*3/uL (ref 4.0–10.5)

## 2012-03-11 LAB — COMPREHENSIVE METABOLIC PANEL
Alkaline Phosphatase: 100 U/L (ref 39–117)
BUN: 10 mg/dL (ref 6–23)
Creatinine, Ser: 1.15 mg/dL (ref 0.50–1.35)
GFR calc Af Amer: 82 mL/min — ABNORMAL LOW (ref >90)
Glucose, Bld: 133 mg/dL — ABNORMAL HIGH (ref 70–99)
Potassium: 3.4 mEq/L — ABNORMAL LOW (ref 3.5–5.1)
Total Bilirubin: 0.5 mg/dL (ref 0.3–1.2)
Total Protein: 7.4 g/dL (ref 6.0–8.3)

## 2012-03-11 LAB — CARDIAC PANEL(CRET KIN+CKTOT+MB+TROPI)
CK, MB: 2 ng/mL (ref 0.3–4.0)
CK, MB: 2 ng/mL (ref 0.3–4.0)
Total CK: 136 U/L (ref 7–232)
Total CK: 143 U/L (ref 7–232)
Troponin I: <0.3 ng/mL (ref <0.30)

## 2012-03-11 LAB — GLUCOSE, CAPILLARY
Glucose-Capillary: 144 mg/dL — ABNORMAL HIGH (ref 70–99)
Glucose-Capillary: 108 mg/dL — ABNORMAL HIGH (ref 70–99)

## 2012-03-11 MED ORDER — LISINOPRIL 10 MG PO TABS: 10.0000 mg | ORAL_TABLET | Freq: Every day | ORAL | Status: DC

## 2012-03-11 MED ORDER — METOPROLOL TARTRATE 25 MG PO TABS: 25.0000 mg | ORAL_TABLET | Freq: Two times a day (BID) | ORAL | Status: DC

## 2012-03-11 MED ORDER — QUETIAPINE FUMARATE 100 MG PO TABS: 100.0000 mg | ORAL_TABLET | Freq: Every day | ORAL | Status: DC

## 2012-03-11 MED ORDER — MIRTAZAPINE 15 MG PO TABS: 15.0000 mg | ORAL_TABLET | Freq: Every day | ORAL | Status: DC

## 2012-03-11 MED ORDER — SODIUM CHLORIDE 0.9 % IV BOLUS (SEPSIS)
1000.0000 mL | Freq: Once | INTRAVENOUS | Status: AC
  Administered 2012-03-11: 1000 mL via INTRAVENOUS

## 2012-03-11 MED ORDER — CYCLOBENZAPRINE HCL 5 MG PO TABS: 5.0000 mg | ORAL_TABLET | Freq: Three times a day (TID) | ORAL | Status: AC | PRN

## 2012-03-11 NOTE — Discharge Summary (Signed)
Patient Name:  Jacob Joseph MRN: [id number]  PCP: Lars Mage, MD DOB:  [birth year]     Date of Admission:  03/08/2012  Date of Discharge:  03/11/2012      Attending Physician: Farley Ly, MD      DISCHARGE DIAGNOSES: 1. Recurrent Stroke Symptoms 2. Sinus Tachycardia with orthostatic hypotension 3. Depression/Insomnia 4. Transaminitis 5. Essential HTN 6. Hx of substance abuse 7. HIV 8. Chronic Lower Back Pain 9. CKD Stage II 10. Diabetes Mellitus  DISPOSITION AND FOLLOW-UP: [name] is to follow-up with the listed providers as detailed below, at which time, the following should be addressed:   1. Follow-up visits: 1. Assess weakness 2. Speech 3. Sleeping status? His insomnia/depression meds have been decreased. 4. Chest pain/palpitations 5. Light-headedness, falls 6. Back pain  2. Labs / imaging needed: 1. Transaminases 2. CD4 level  Follow-up Information    Follow up with [name], MD on 04/11/2012. (at 1:00 PM)    Contact information:   [address] [facility] [place] [address] [phone]       Follow up with Lars Mage, MD on 03/14/2012. (at 3:30 PM)    Contact information:   [address] [place] [address] [phone]       Follow up with [name], MD on 03/28/2012. (at 2:00 PM)    Contact information:   [address] [place] [address] [phone]         Discharge Orders    Future Appointments: Provider: Department: Dept Phone: Center:   03/14/2012 3:45 PM Lars Mage, MD [facility] [phone] [facility]   03/15/2012 9:45 AM Marlis Edelson, OT [facility] [phone] [facility]   03/15/2012 10:30 AM Nestor Lewandowsky, PT [facility] [phone] [facility]   03/16/2012 1:00 PM Marlis Edelson, OT [facility] [phone] [facility]   03/17/2012 10:30 AM Nestor Lewandowsky, PT [facility] [phone] [facility]   03/23/2012  9:10 AM Liz Malady, MD [facility] [phone] None   03/23/2012 10:30 AM Marlis Edelson, OT [facility] [phone] [facility]   03/23/2012 11:15 AM Barron Alvine, CCC-SLP [facility] [phone] [facility]   03/23/2012 1:00 PM Hortencia Conradi, PTA [facility] [phone] [facility]   03/24/2012 9:00 AM Cliffton Asters, MD [facility] [phone] [facility]   03/25/2012 1:00 PM Colonel Bald, OT [facility] [phone] [facility]   03/25/2012 1:45 PM [facility] Therap[facility] 1 [facility] [phone] [facility]   03/29/2012 9:45 AM Trixie Deis [facility] [phone] [facility]   03/29/2012 10:30 AM Barron Alvine, CCC-SLP [facility] [phone] [facility]   03/29/2012 11:15 AM Nestor Lewandowsky, PT [facility] [phone] [facility]   03/31/2012 1:00 PM Trixie Deis [facility] [phone] [facility]   03/31/2012 1:45 PM Hortencia Conradi, PTA [facility] [phone] [facility]   03/31/2012 2:30 PM Barron Alvine, CCC-SLP [facility] [phone] [facility]   04/04/2012 9:45 AM Barron Alvine, CCC-SLP [facility] [phone] [facility]   04/04/2012 10:30 AM Marlis Edelson, OT [facility] [phone] [facility]   04/04/2012 11:15 AM Hortencia Conradi, PTA [facility] [phone] [facility]   04/06/2012 9:45 AM Ellen Henri, CCC-SLP [facility] [phone] [facility]   04/06/2012 10:30 AM Nestor Lewandowsky, PT [facility] [phone] [facility]   04/06/2012 11:15 AM Marlis Edelson, OT [facility] [phone] [facility]   04/11/2012 9:45 AM [facility] Therapist 3 [facility] [phone] [facility]   04/11/2012 10:30 AM Marylene Land  Lovett Sox, OT [facility] [phone] [facility]   04/11/2012 11:15 AM Nestor Lewandowsky, PT [facility] [phone] [facility]   04/13/2012 9:45 AM Barron Alvine, CCC-SLP [facility] [phone] [facility]   04/13/2012 10:30 AM Marlis Edelson, OT [facility] [phone] [facility]   04/13/2012 11:15 AM Hortencia Conradi, PTA [facility] [phone] [facility]   06/07/2012 2:00 PM [facility] [phone] [facility]   06/21/2012 10:00 AM Cliffton Asters, MD [facility] [phone] [facility]     Future Orders Please Complete By Expires   Diet - low sodium heart healthy      Increase activity slowly      Discharge instructions      Comments:   Please take medications as prescribed  Follow up for your opc appointment on Monday, 03/14/12 at 330pm   Call MD for:  persistant dizziness or light-headedness      Call MD for:  extreme fatigue      Call MD for:  difficulty breathing, headache or visual disturbances        DISCHARGE MEDICATIONS: Medication List  As of 03/12/2012  7:24 AM   STOP taking these medications         atorvastatin 10 MG tablet      clopidogrel 75 MG tablet         TAKE these medications         aspirin 81 MG EC tablet   Take 4 tablets (325 mg total) by mouth daily.      cyclobenzaprine 5 MG tablet   Commonly known as: FLEXERIL   Take 1 tablet (5 mg total) by mouth 3 (three) times daily as needed for muscle spasms.      lamiVUDine-zidovudine 150-300 MG per tablet   Commonly known as: COMBIVIR   Take 1 tablet by mouth 2 (two) times daily.      lisinopril 10 MG tablet   Commonly known as: PRINIVIL,ZESTRIL   Take 1 tablet (10 mg total) by mouth daily.      lopinavir-ritonavir 200-50 MG per tablet   Commonly known as: KALETRA   Take 2 tablets by mouth 2 (two) times daily.      metoprolol tartrate 25 MG tablet   Commonly known as: LOPRESSOR   Take 1 tablet (25 mg total) by mouth 2 (two) times daily.      mirtazapine 15 MG tablet   Commonly known as: REMERON   Take 1 tablet (15 mg total) by mouth at bedtime.      nicotine 14 mg/24hr patch   Commonly known as: NICODERM CQ - dosed in mg/24 hours   Place 1 patch onto the skin daily.      QUEtiapine 100 MG tablet   Commonly known as: SEROQUEL   Take 1 tablet (100 mg total) by mouth at bedtime.      tenofovir 300  MG tablet   Commonly known as: VIREAD   Take 300 mg by mouth daily.      zolpidem 10 MG tablet   Commonly known as: AMBIEN   Take 10 mg by mouth at bedtime as needed. For sleep           CONSULTS: Neurology  PROCEDURES PERFORMED:  Dg Chest 2 View  03/08/2012  *RADIOLOGY REPORT*  Clinical Data: Fall, dizziness, cough, smoker, HIV  CHEST - 2 VIEW  Comparison: 12/04/2011  Findings: Streaky bibasilar densities compatible with atelectasis. Negative for focal pneumonia, edema, collapse, consolidation, effusion or pneumothorax.  Trachea midline.  IMPRESSION: Bibasilar atelectasis.   Original Report Authenticated By: Judie Petit. Ruel Favors, M.D.    Ct Head Wo Contrast  03/08/2012  *RADIOLOGY REPORT*  Clinical Data: Fall, eight fascia  CT HEAD WITHOUT CONTRAST  Technique:  Contiguous axial images were obtained from the base of the skull through the vertex without contrast.  Comparison: Brain MRI 12/04/2011; brain CT 12/03/2011  Findings: No acute intracranial hemorrhage, acute infarction, mass lesion, mass effect, hydrocephalus or midline shift.  Focal hypoattenuating lesions in the left posterior frontal and parietal white matter, and within the subcortical white matter of the right frontal watershed distribution correspond with areas of acute and subacute infarction seen on prior MRI dated 12/04/2011.  No definite new subacute infarct identified by noncontrast CT. Atherosclerotic vascular calcifications of the bilateral cavernous carotid arteries again noted.  Mild generalized cerebellar and cerebral volume loss again noted and appears advanced for age. Mastoid air cells and paranasal sinuses are well-aerated.  No acute bony calvarial abnormality.  The globes are intact.  IMPRESSION:  1.  No acute intracranial abnormality by noncontrast CT scan. 2.  Expected evolution of previously identified infarcts in the periventricular white matter of the left posterior frontal and parietal lobes, and subcortical white  matter of the right frontal watershed region. 3.  Age advanced cerebral and cerebellar volume loss 4.  Intracranial atherosclerosis   Original Report Authenticated By: Vilma Prader    [name] Head Wo Contrast  03/09/2012  *RADIOLOGY REPORT*  Clinical Data:  Worsening right-sided weakness.  MRI HEAD WITHOUT CONTRAST MRA HEAD WITHOUT CONTRAST  Technique:  Multiplanar, multiecho pulse sequences of the brain and surrounding structures were obtained without intravenous contrast. Angiographic images of the head were obtained using MRA technique without contrast.  Comparison:  Head CT 03/08/2012.  MRI 12/04/2011.  MRI HEAD  Findings:  Diffusion imaging does not show any acute or subacute infarction.  The brainstem and cerebellum are normal.  The cerebral hemispheres show old small vessel infarctions affecting the hemispheric white matter, most pronounced in the deep white matter on the left.  There is an old cortical and subcortical infarction in the right frontoparietal vertex.  No large vessel territory infarction.  No mass lesion, hemorrhage, hydrocephalus or extra- axial collection.  No pituitary mass.  No inflammatory sinus disease.  No skull or skull base lesion.  IMPRESSION: No acute infarction.  Scattered old small vessel infarctions affecting the cerebral hemispheres that could relate to chronic small vessel disease or small embolic infarctions.  MRA HEAD  Findings: Both internal carotid arteries are widely patent at the skull base.  On the right, the anterior and middle cerebral vessels are patent.  No correctable proximal stenosis.  Both anterior cerebral arteries receive their supply from the right side.  On the left, there is chronic occlusion of the supraclinoid internal carotid artery.  Left posterior cerebral artery takes a fetal origin from the anterior circulation.  Both vertebral arteries are patent to the basilar.  No basilar stenosis.  Posterior circulation branch vessels are patent, with left PCA taking a  fetal origin as described above.  IMPRESSION: No change since the previous study.  Complete occlusion of the left supraclinoid internal carotid artery after the fetal origin of the left PCA.   Original Report Authenticated By: Thomasenia Sales, M.D.    Mr Brain Wo Contrast  03/09/2012  *RADIOLOGY REPORT*  Clinical Data:  Worsening right-sided weakness.  MRI HEAD WITHOUT CONTRAST MRA HEAD WITHOUT CONTRAST  Technique:  Multiplanar,  multiecho pulse sequences of the brain and surrounding structures were obtained without intravenous contrast. Angiographic images of the head were obtained using MRA technique without contrast.  Comparison:  Head CT 03/08/2012.  MRI 12/04/2011.  MRI HEAD  Findings:  Diffusion imaging does not show any acute or subacute infarction.  The brainstem and cerebellum are normal.  The cerebral hemispheres show old small vessel infarctions affecting the hemispheric white matter, most pronounced in the deep white matter on the left.  There is an old cortical and subcortical infarction in the right frontoparietal vertex.  No large vessel territory infarction.  No mass lesion, hemorrhage, hydrocephalus or extra- axial collection.  No pituitary mass.  No inflammatory sinus disease.  No skull or skull base lesion.  IMPRESSION: No acute infarction.  Scattered old small vessel infarctions affecting the cerebral hemispheres that could relate to chronic small vessel disease or small embolic infarctions.  MRA HEAD  Findings: Both internal carotid arteries are widely patent at the skull base.  On the right, the anterior and middle cerebral vessels are patent.  No correctable proximal stenosis.  Both anterior cerebral arteries receive their supply from the right side.  On the left, there is chronic occlusion of the supraclinoid internal carotid artery.  Left posterior cerebral artery takes a fetal origin from the anterior circulation.  Both vertebral arteries are patent to the basilar.  No basilar stenosis.   Posterior circulation branch vessels are patent, with left PCA taking a fetal origin as described above.  IMPRESSION: No change since the previous study.  Complete occlusion of the left supraclinoid internal carotid artery after the fetal origin of the left PCA.   Original Report Authenticated By: Thomasenia Sales, M.D.      ADMISSION DATA: H&P: Mr. Krinke is a 56 year old man with a history of recent stroke in 11/2011 with residual right sided weakness, HIV, HTN, Diabetes mellitus type II, CKD stage II, and normocytic anemia who presents the [facility] ED for evaluation of worsening of his right sided upper and lower extremity weakness. He states that for the last 2-3 days he has noticed that he has had more weakness on the right side, especially with the right leg and right hand. He also notes that he has some mild numbness in the right lateral foot. He states that his wife has noticed that he is dragging his right leg more and that he has been slurring his words worse then the week previous. He states that he feels like sometimes he cant make the words but denies any problems with word finding.   He continues to smoke cigarettes with at least a 35 pack year history. He currently smokes about 1/2 ppd. He also uses cocaine about 1-2 times per week with his last use being about a week prior to admission. He states that this is much less then before his stroke in May. He does have a central forehead headache for the last 4-5 days as well as a flare of his chronic back pain. He denies diarrhea, constipation, changes in his urination, changes in diet, chest pain, syncope, palpitations, SOB, or abdominal pain.  He was diagnosed with HIV in 1984 and states that he has been taking his medications. His last CD4 in the system was 407 with an undetectable viral load. He states that all of his family members know of his diagnosis.   Physical Exam: Blood pressure 169/90, pulse 62, temperature 98.8 F (37.1 C), temperature  source Oral, resp. rate  18, height 6' (1.829 m), weight 187 lb 13.3 oz (85.2 kg), SpO2 100.00%.  Constitutional: Vital signs reviewed. Patient is a well-developed and well-nourished man in no acute distress and cooperative with exam. Alert and oriented x3.  Head: Normocephalic and atraumatic  Ear: TM normal bilaterally  Mouth: no erythema or exudates, MMM  Eyes: PERRL, EOMI, conjunctivae normal, No scleral icterus.  Neck: Supple, Trachea midline normal ROM, No JVD, mass, thyromegaly, or carotid bruit present.  Cardiovascular: RRR, S1 normal, S2 normal, no MRG, pulses symmetric and intact bilaterally  Pulmonary/Chest: CTAB, no wheezes, rales, or rhonchi  Abdominal: Soft. Non-tender, non-distended, bowel sounds are normal, no masses, organomegaly, or guarding present.  GU: no CVA tenderness Musculoskeletal: No joint deformities, erythema, or stiffness, ROM full and no nontender Hematology: no cervical, inginal, or axillary adenopathy.  Neurological: A&O x3, There is mild loss of sensation around the 5th toe on the right foot to the mid foot. Sensation on the left foot as well as on the right heel and ankle are intact and symmetric. He has moderate muscle atrophy noted on the dorsum of the right hand as well as in the right leg around the knee. He also has muscle atrophy on the hypothenar and thenar eminance on the right. right grip strength was 4/5 compared to the left. Strength in the right elbow and shoulder are 5/5. Planter flexion of the right foot is 4+/5 when compared to the left. DTRs are 2+ in the bilateral biceps, knees, and ankle. No clonus noted and babinski is downgoing.  Skin: Warm, dry and intact. No rash, cyanosis, or clubbing.  Psychiatric: Normal mood and affect. speech and behavior is normal. Judgment and thought content normal. Cognition and memory are normal.   Labs: Basic Metabolic Panel:   Basename  03/08/12 1542   NA  139   K  3.9   CL  106   CO2  25   GLUCOSE  98   BUN   11   CREATININE  1.20   CALCIUM  9.3   MG  2.0   PHOS  --    CBC:   Basename  03/08/12 1455   WBC  3.9*   NEUTROABS  1.0*   HGB  12.6*   HCT  38.0*   MCV  98.2   PLT  197    CBG:   Basename  03/08/12 1415   GLUCAP  87    Drugs of Abuse    Component  Value  Date/Time    LABOPIA  POS*  12/23/2011 0955    LABOPIA  NONE DETECTED  12/03/2011 1638    COCAINSCRNUR  POS*  12/23/2011 0955    COCAINSCRNUR  POSITIVE*  12/03/2011 1638    LABBENZ  NEG  12/23/2011 0955    LABBENZ  NONE DETECTED  12/03/2011 1638    AMPHETMU  NONE DETECTED  12/03/2011 1638    THCU  NONE DETECTED  12/03/2011 1638    LABBARB  NEG  12/23/2011 0955    LABBARB  NONE DETECTED  12/03/2011 1638    Urinalysis:   Basename  03/08/12 1415   COLORURINE  AMBER*   LABSPEC  1.022   PHURINE  7.0   GLUCOSEU  NEGATIVE   HGBUR  TRACE*   BILIRUBINUR  NEGATIVE   KETONESUR  NEGATIVE   PROTEINUR  30*   UROBILINOGEN  2.0*   NITRITE  NEGATIVE   LEUKOCYTESUR  TRACE   HOSPITAL COURSE: 1. Recurrent Stroke Symptoms: Mr. Salsgiver presented  with right-sided weakness and right facial droop, symptoms of his previous stroke. No new neurologic signs were appreciated compared to his previous neurological exam on 12/04/11. Head CT was negative for any new lesions. He says that since his previous stroke he has been attending PT/OT/speech therapy and has been showing improvement. He had an unexplained decompensation approximately 3 days before presentation. He was admitted on the neuro floor with Q4 neuro checks on telemetry. His cardiac enzymes were trended and were negative. A1C and lipid panel were not concerning for stroke risk (see "Transaminitis" re: lipid panel). MRI/MRA was negative for new ischemia and carotid dopplers were negative as well. This is unlikely to be a new CVA. Risk factors for vasospasm and worsening of symptoms include untreated hypertension, continued smoking, and recent cocaine use. UDS was negative. Patient received an  echocardiogram in May 2013 which was negative. His presentation was unlikely for an embolic process, so echo was not performed. Neurology was consulted, who suggested changing his antiplatelet regimen from ASA 81 and Plavix to ASA 325. Their reasoning was that he has completed his 3 month Plavix regimen at this point and had no evidence of infarct.  2. Sinus tachycardia with orthostatic hypotension: Mr. Parkerson had a run of sinus tachycardia x 6 minutes before his attempted discharge on 03/10/12. He described a "jitteriness" with palpitations during the episode. He said this happened to him when his symptom started ~2 days before admission and also in May when he had his stroke. Telemetry had been sinus rhythm to that point. He was kept in the hospital for another night. Telemetry overnight showed normal sinus rhythm. EKG was unchanged. CE's were negative. Cardiology was consulted, who said that his sinus tach was likely caused by quetiapine and mirtazepine; they recommended a B-blocker. We decreased his seroquel from 300 to 200 and his remeron from 45 to 30. Zolipidem PRN for insomnia. Metoprolol 25 mg was started. He tolerated these changes well overnight. Dr. Algie Coffer cleared Mr. Keltner to go home from a cardiac standpoint on 8/23. He has a follow-up appointment scheduled in 2 weeks.On orthostatics, he did show a drop in his SBP from 146 lying to 125 standing. He also endorsed dizziness when getting up. He was given a 1L bolus of IV NS before discharge.  3. Depression: Mr. Whetsel has a history of insomnia likely 2/2 depression. When he was admitted the team continued his seroquel 300 mg PO QDay, remeron 45 mg tab PO QDay. Due to concern for excessive sedation due to these medications, his home Remus Loffler was held. The patient reported no symptoms of depression during his hospitalization, stating his mood has been good. Per cardiac recommendations (see "sinus tachycardia"), we decreased his seroquel form 300 to  200 and his remeron from 45 to 30 and added Zolipidem PRN for insomnia on 8/22. On his day of discharge, he was sent home on Seroquel 100 mg and Remeron 15 mg with zolpidem PRN.  4. Transaminitis: Admission labs for Mr. Hasegawa showed an elevated ALT/AST of 127/146 respectively. On 12/03/11, his ALT/AST levels were 38/64 respectively. The levels taken on May were before he was started on atorvastatin 10 mg QDay. His extremely low LDL levels (from 55 to 19), elevated transaminases, presence of active HCV, and taking Viread (a medication known for hepatotoxicity) led the team to determine the risk associated with taking a statin exceeded the benefit of the anti-inflammatory properties of statins.  LFT's were repeated on 8/23 and showed ALT/AST of 155/132. Mr. Vaile'  LFTs should be repeated on an outpatient basis to reassess liver function. In addition, he had a negative HBV antigen and antibody during this hospitalization in addition to a hospitalization in 2008. He should receive an HBV vaccination as soon as possible.  5. HTN: Mr. Tietze was taken off lisinopril 20 mg PO QDay after his stroke on 12/04/11 to allow optimal brain perfusion. The d/c summary of this hospitalization mentioned there should be close follow-up for this. During this hospitalization, Mr. Armstrong' blood pressures have averaged 140s systolic and 90s-100s diastolic. The team restarted his lisinopril at 10 mg PO QDay for bp control and renal protection (pt is diabetic). He was also started on metoprolol 25 mg.  6. Hx of Substance abuse: Mr. Saleeby has a history of smoking, alcohol, cocaine, and heroin use. There was concern on admission that recent cocaine use along with smoking may have contributed to his current symptoms. He reported on admission that he had not used cocaine for 1.5 weeks and that he has decreased his use significantly. There was concern for alcoholism, so he was given one bolus NS thiamine 100 mg, folic acid 1 mg,  multivitamins on admission. UDS was negative. He expressed interest in smoking cessation and was given a prescription for a nicotine patch on discharge.  7. HIV: Mr. Fregia has a history of compliance with his HIV regimen. He was continued on his current ART: combivir (lamivudine-zidovudine) 150-300, kaletra (lopinavir-ritonavir) 200-50, tenofovir 300. On admission he was leukopenic (WBC 3.9); CD4 count was a reassuring 540, previous CD4 level was 407. WBC on discharge was 4.3.  Continue monitoring his HIV and leukopenia outpatient.  8. Chronic Lower back pain: Mr. Schaller has a significant history of chronic back pain. Based on physical exam on admission, patients pain more c/w muscle strain rather than a skeletal process; his tenderness was lateral to the spinous process. He was given tylenol 650 mg PO/suppository Q6H PRN pain and oxycodone 5 mg tab Q4H PRN moderate pain, which adequately controlled his pain. He was discharged home with a prescription for flexeril.  9. CKD Stage II with mild proteinuria: Patient has an increased protein at 30 on U/A on admission and on 8/22. This is a chronic process for him. He received a liter of fluids on admission and his BUN/Cr remained at his baseline during this hospitalization.  10. Diabetes Mellitus: Patient was put on SSI during his stay at the hospital. Glucose levels were stable during his stay. His A1C was 5.9%.  DISCHARGE DATA: Vital Signs: BP 108/67  Pulse 84  Temp 99 F (37.2 C) (Oral)  Resp 18  Ht 6' (1.829 m)  Wt 85.2 kg (187 lb 13.3 oz)  BMI 25.47 kg/m2  SpO2 100%  Labs: Results for orders placed during the hospital encounter of 03/08/12 (from the past 24 hour(s))  GLUCOSE, CAPILLARY     Status: Abnormal   Collection Time   03/10/12  4:39 PM      Component Value Range   Glucose-Capillary 116 (*) 70 - 99 mg/dL  CARDIAC PANEL(CRET KIN+CKTOT+MB+TROPI)     Status: Normal   Collection Time   03/10/12  6:43 PM      Component Value  Range   Total CK 149  7 - 232 U/L   CK, MB 1.9  0.3 - 4.0 ng/mL   Troponin I <0.30  <0.30 ng/mL   Relative Index 1.3  0.0 - 2.5  GLUCOSE, CAPILLARY     Status: Abnormal   Collection Time  03/10/12  9:30 PM      Component Value Range   Glucose-Capillary 128 (*) 70 - 99 mg/dL  CARDIAC PANEL(CRET KIN+CKTOT+MB+TROPI)     Status: Normal   Collection Time   03/11/12 12:00 AM      Component Value Range   Total CK 143  7 - 232 U/L   CK, MB 2.0  0.3 - 4.0 ng/mL   Troponin I <0.30  <0.30 ng/mL   Relative Index 1.4  0.0 - 2.5  GLUCOSE, CAPILLARY     Status: Abnormal   Collection Time   03/11/12  6:51 AM      Component Value Range   Glucose-Capillary 144 (*) 70 - 99 mg/dL  COMPREHENSIVE METABOLIC PANEL     Status: Abnormal   Collection Time   03/11/12  7:05 AM      Component Value Range   Sodium 135  135 - 145 mEq/L   Potassium 3.4 (*) 3.5 - 5.1 mEq/L   Chloride 102  96 - 112 mEq/L   CO2 21  19 - 32 mEq/L   Glucose, Bld 133 (*) 70 - 99 mg/dL   BUN 10  6 - 23 mg/dL   Creatinine, Ser 1.61  0.50 - 1.35 mg/dL   Calcium 8.9  8.4 - 09.6 mg/dL   Total Protein 7.4  6.0 - 8.3 g/dL   Albumin 2.9 (*) 3.5 - 5.2 g/dL   AST 045 (*) 0 - 37 U/L   ALT 132 (*) 0 - 53 U/L   Alkaline Phosphatase 100  39 - 117 U/L   Total Bilirubin 0.5  0.3 - 1.2 mg/dL   GFR calc non Af Amer 70 (*) >90 mL/min   GFR calc Af Amer 82 (*) >90 mL/min  CBC WITH DIFFERENTIAL     Status: Abnormal   Collection Time   03/11/12  7:05 AM      Component Value Range   WBC 4.3  4.0 - 10.5 K/uL   RBC 3.80 (*) 4.22 - 5.81 MIL/uL   Hemoglobin 12.2 (*) 13.0 - 17.0 g/dL   HCT 40.9 (*) 81.1 - 91.4 %   MCV 96.3  78.0 - 100.0 fL   MCH 32.1  26.0 - 34.0 pg   MCHC 33.3  30.0 - 36.0 g/dL   RDW 78.2  95.6 - 21.3 %   Platelets 199  150 - 400 K/uL   Neutrophils Relative 31 (*) 43 - 77 %   Neutro Abs 1.3 (*) 1.7 - 7.7 K/uL   Lymphocytes Relative 48 (*) 12 - 46 %   Lymphs Abs 2.0  0.7 - 4.0 K/uL   Monocytes Relative 14 (*) 3 - 12 %    Monocytes Absolute 0.6  0.1 - 1.0 K/uL   Eosinophils Relative 7 (*) 0 - 5 %   Eosinophils Absolute 0.3  0.0 - 0.7 K/uL   Basophils Relative 0  0 - 1 %   Basophils Absolute 0.0  0.0 - 0.1 K/uL  CARDIAC PANEL(CRET KIN+CKTOT+MB+TROPI)     Status: Normal   Collection Time   03/11/12  7:05 AM      Component Value Range   Total CK 136  7 - 232 U/L   CK, MB 2.0  0.3 - 4.0 ng/mL   Troponin I <0.30  <0.30 ng/mL   Relative Index 1.5  0.0 - 2.5  GLUCOSE, CAPILLARY     Status: Abnormal   Collection Time   03/11/12 11:49 AM      Component  Value Range   Glucose-Capillary 140 (*) 70 - 99 mg/dL   Time spent on discharge: 1 hr  Signed by: Xzavian Semmel Internal Medicine Resident Pager: [phone] 03/12/2012 7:24 AM

## 2012-03-11 NOTE — Progress Notes (Signed)
Medical Student Daily Progress Note  Subjective: Jacob Joseph is feeling "good" today. Appetite is good, weakness and speech are "about the same", no palpitations or chest pain, he feels light-headed whenever he stands up, sleeping well. He had a run of sinus tachycardia before for six minutes before being discharged yesterday, which has kept him in the hospital.  Objective: Vital signs in last 24 hours: Filed Vitals:   03/10/12 2200 03/10/12 2203 03/10/12 2205 03/11/12 0600  BP: 146/86 140/98 125/89 108/67  Pulse: 96 110 112 84  Temp: 99.4 F (37.4 C)   99 F (37.2 C)  TempSrc: Oral     Resp: 18   18  Height:      Weight:      SpO2: 98%   100%   Weight change:   Intake/Output Summary (Last 24 hours) at 03/11/12 1111 Last data filed at 03/11/12 0900  Gross per 24 hour  Intake   1000 ml  Output    600 ml  Net    400 ml   Physical Exam: Vitals reviewed.  General: resting in bed, NAD  HEENT: PERRL, EOMI, no scleral icterus. Mild right facial droop  Cardiac: RRR, no rubs, murmurs or gallops  Pulm: clear to auscultation bilaterally, no wheezes, rales, or rhonchi  Abd: soft, nontender, nondistended, BS present  Ext: warm and well perfused, no pedal edema  Neuro: alert and oriented X3, cranial nerves II-XII grossly intact, strength equal in bilateral upper extremities. 4/5 in R. Hip, knee, ankle. 5/5 on left lower extremity. See thenar, hypothenar, and interosseus atrophy on right hand and atrophy of muscles around the right knee. Loss of sensation to light touch on right lateral foot, sensation otherwise intact. Psych: Flat affect, denies depression, anhedonia.  Lab Results: Basic Metabolic Panel:  Lab 03/11/12 9604 03/10/12 0713 03/08/12 1542  NA 135 137 --  K 3.4* 3.7 --  CL 102 105 --  CO2 21 22 --  GLUCOSE 133* 126* --  BUN 10 10 --  CREATININE 1.15 1.12 --  CALCIUM 8.9 8.8 --  MG -- -- 2.0  PHOS -- -- --   Liver Function Tests:  Lab 03/11/12 0705 03/10/12 0713    AST 155* 188*  ALT 132* 137*  ALKPHOS 100 94  BILITOT 0.5 0.5  PROT 7.4 7.3  ALBUMIN 2.9* 2.9*   CBC:  Lab 03/11/12 0705 03/10/12 0713  WBC 4.3 2.6*  NEUTROABS 1.3* 0.6*  HGB 12.2* 12.1*  HCT 36.6* 35.2*  MCV 96.3 95.1  PLT 199 198   Cardiac Enzymes:  Lab 03/11/12 0705 03/11/12 03/10/12 1843  CKTOTAL 136 143 149  CKMB 2.0 2.0 1.9  CKMBINDEX -- -- --  TROPONINI <0.30 <0.30 <0.30   CBG:  Lab 03/11/12 0651 03/10/12 2130 03/10/12 1639 03/10/12 1119 03/10/12 0700 03/09/12 2145  GLUCAP 144* 128* 116* 127* 135* 110*   Hemoglobin A1C:  Lab 03/08/12 2106  HGBA1C 5.9*   Fasting Lipid Panel:  Lab 03/09/12 0222  CHOL 83  HDL 43  LDLCALC 19  TRIG 107  CHOLHDL 1.9  LDLDIRECT --   Urine Drug Screen: Drugs of Abuse     Component Value Date/Time   LABOPIA NEGATIVE 03/09/2012 0741   LABOPIA NONE DETECTED 12/03/2011 1638   COCAINSCRNUR NEGATIVE 03/09/2012 0741   COCAINSCRNUR POSITIVE* 12/03/2011 1638   LABBENZ NEGATIVE 03/09/2012 0741   LABBENZ NEG 12/23/2011 0955   LABBENZ NONE DETECTED 12/03/2011 1638   AMPHETMU NEGATIVE 03/09/2012 0741   AMPHETMU NONE DETECTED 12/03/2011 1638  THCU NONE DETECTED 12/03/2011 1638   LABBARB NEG 12/23/2011 0955   LABBARB NONE DETECTED 12/03/2011 1638    Alcohol Level: No results found for this basename: ETH:2 in the last 168 hours Urinalysis:  Lab 03/10/12 0300 03/08/12 1415  COLORURINE AMBER* AMBER*  LABSPEC 1.018 1.022  PHURINE 7.0 7.0  GLUCOSEU NEGATIVE NEGATIVE  HGBUR NEGATIVE TRACE*  BILIRUBINUR NEGATIVE NEGATIVE  KETONESUR NEGATIVE NEGATIVE  PROTEINUR 30* 30*  UROBILINOGEN 4.0* 2.0*  NITRITE NEGATIVE NEGATIVE  LEUKOCYTESUR NEGATIVE TRACE*   Medications: I have reviewed the patient's current medications. Scheduled Meds:   . aspirin EC  81 mg Oral Daily  . atorvastatin  10 mg Oral q1800  . clopidogrel  75 mg Oral Daily  . enoxaparin (LOVENOX) injection  40 mg Subcutaneous Q24H  . feeding supplement  237 mL Oral BID BM  .  insulin aspart  0-9 Units Subcutaneous TID WC  . lamiVUDine-zidovudine  1 tablet Oral BID  . lisinopril  10 mg Oral Daily  . lopinavir-ritonavir  2 tablet Oral BID  . metoprolol tartrate  25 mg Oral BID  . mirtazapine  30 mg Oral QHS  . nicotine  14 mg Transdermal Daily  . QUEtiapine  200 mg Oral QHS  . general admission iv infusion   Intravenous Once  . sodium chloride  1,000 mL Intravenous Once  . sodium chloride  3 mL Intravenous Q12H  . tenofovir  300 mg Oral Daily  . DISCONTD: mirtazapine  45 mg Oral QHS  . DISCONTD: QUEtiapine  300 mg Oral QHS   Continuous Infusions:  PRN Meds:.acetaminophen, acetaminophen, ondansetron (ZOFRAN) IV, ondansetron, oxyCODONE Assessment/Plan: *Right sided weakness: His weakness and facial droop seemed to have improved since admission. His negative workup indicates there is little that can be done for him in the hospital. The etiology of this process may be 2/2 vasospasm with hypertension. He has not returned to his baseline strength prior to the episode; it is unclear if this will be his new baseline or if rehab can bring him back to his old functional status.  - Home PT/OT rehab: 24 hr assistance with outpatient OT 2x/wk minimum. Case management will f/u regarding orders to resume his outpatient PT/OT.  Active Problems:  Sinus Tachycardia: Jacob Joseph had a run of sinus tachycardia x 6 minutes before his attempted discharge on 03/10/12. He described a "jitteriness" with palpitations during the episode. He said this happened to him when his symptom started ~2 days before admission and also in May when he had his stroke. EKG was unchanged, CE's were negative. Telemetry overnight showed NSR. - Cards c/s: sinus tach likely caused by quetiapine and mirtazepine, recommend B-blocker. We decreased his seroquel from 300 to 200 and his remeron from 45 to 30. Zolipidem PRN for insomnia. Metoprolol 25 mg was started. - Per Dr. Algie Coffer - patient is clear to go home from  a cards standpoint. F/U in 2 weeks.  ESSENTIAL HYPERTENSION: Mr. Catanzaro' blood pressures have averaged 140s systolic and 90s-100s diastolic. The team restarted lisinopril 10 mg PO QDay for bp control and renal protection (pt is diabetic) on 8/22. His orthostatics were measured after his episode of sinus tach on 8/22 and showed a drop in SBP from 146 lying to 125 standing. He also endorses positional dizziness. - Give 1L Bolus IVF, repeat orthostatics after bolus. - monitor bp response to metoprolol. This could potentially worsen his orthostatic hypotension  INSOMNIA/Depression: Pt has had no issues with insomnia or depression during his stay. We  have decreased his seroquel form 300 to 200 and his remeron from 45 to 30. Zolipidem PRN for insomnia. - f/u with outpatient PCP for med management  Chronic back pain: Well-controlled on current regimen.  - Consider changing PRN from oxycodone to flexeril. Pt will be discharged with flexeril.  Transaminitis: Admission labs for Jacob Joseph showed an elevated ALT/AST of 127/146 respectively. On 12/03/11, his ALT/AST levels were 38/64 respectively. The levels taken on May were before he was started on atorvastatin 10 mg QDay. Levels on 8/23 remain elevated.  -statin dc'd - Mr. Cherne' LFTs should be repeated on an outpatient basis to reassess liver function.  -He had a negative HBV antigen and antibody during this hospitalization in addition to a hospitalization in 2008. He should receive an HBV vaccination on an outpatient basis.  HIV INFECTION: WBC trending down (3.9-2.6). CD4 count was a reassuring 540.  - continue home HIV meds   Polysubstance abuse: UDS was negative, pt admits the last time using cocaine to be ~1.5 wks ago.  - Continue encouraging substance cessation, the team has been educating the patient on the risks associated with cocaine abuse.   CKD (chronic kidney disease), stage II: Patient is currently at his baseline BUN/Cr and GFR.  These values along with no clinical symptoms indicate that this is a chronic intra-renal process.   TOBACCO USER: 1/2 PPD smoker, expressed interest in quitting. Sending patient home with nicotine patch   Dispo: Jacob Joseph will need to stay at least for the day to monitor his response to the new medications. He is stable from a cards standpoint. - f/u appts set with Dr. Pearlean Brownie (neuro), Dr. Orvan Falconer (ID), and Dr. Eben Burow (medicine), Dr. Lenard Lance (cards)    LOS: 3 days   This is a Medical Student Note.  The care of the patient was discussed with Dr. Tonny Branch and the assessment and plan formulated with their assistance.  Please see their attached note for official documentation of the daily encounter.  Luretha Rued 03/11/2012, 11:11 AM

## 2012-03-11 NOTE — Progress Notes (Signed)
Occupational Therapy Treatment Patient Details Name: Jacob Joseph MRN: [id number] DOB: Dec 16, 1955 Today's Date: 03/11/2012 Time: 0865-7846 OT Time Calculation (min): 15 min  OT Assessment / Plan / Recommendation Comments on Treatment Session Focus of treament session on educating pt on RUE HEP.  Pt provided with fine motor and theraputty exercises.  Continues to demonstrate decreased RUE strength and decreased FMC.  Pt reports feeling very fatigued this morning and declining OOB activity.    Follow Up Recommendations  Supervision/Assistance - 24 hour;Outpatient OT    Barriers to Discharge       Equipment Recommendations  None recommended by OT    Recommendations for Other Services    Frequency Min 2X/week   Plan Discharge plan remains appropriate    Precautions / Restrictions     Pertinent Vitals/Pain See vitals    ADL  ADL Comments: Focus of session on HEP.      OT Diagnosis:    OT Problem List:   OT Treatment Interventions:     OT Goals Arm Goals Additional Arm Goal #1: Pt will incorporate RUE during 75% of ADL activity. Arm Goal: Additional Goal #1 - Progress: Progressing toward goals Additional Arm Goal #2: Pt will independently perform RUE fine motor HEP. Arm Goal: Additional Goal #2 - Progress: Progressing toward goals  Visit Information  Last OT Received On: 03/11/12    Subjective Data      Prior Functioning       Cognition  Overall Cognitive Status: Appears within functional limits for tasks assessed/performed Arousal/Alertness: Awake/alert Orientation Level: Appears intact for tasks assessed Behavior During Session: [facility] for tasks performed    Mobility Bed Mobility Bed Mobility: Sit to Supine Supine to Sit: 6: Modified independent (Device/Increase time) Sitting - Scoot to Edge of Bed: 6: Modified independent (Device/Increase time) Sit to Supine: 6: Modified independent (Device/Increase time)   Exercises Hand Exercises Digit Composite Flexion:  AROM;Right;10 reps;Seated Composite Extension: AROM;Right;10 reps;Seated Digit Composite Abduction: AROM;Right;10 reps;Seated Digit Composite Adduction: AROM;Right;10 reps;Seated Digit Lifts: AROM;Right;10 reps;Seated Opposition: AROM;Right;10 reps;Seated Other Exercises Other Exercises: Povided pt with supersoft (tan) theraputty and handout of exercises.  Educated pt on technique and pt demonstrated exercises with min cueing for technique.  Balance    End of Session OT - End of Session Activity Tolerance: Patient limited by fatigue Patient left: in bed;with call bell/phone within reach  GO   03/11/2012 Cipriano Mile OTR/L Pager [phone] Office [phone]   Cipriano Mile 03/11/2012, 8:53 AM

## 2012-03-11 NOTE — Progress Notes (Signed)
Physical Therapy Treatment Patient Details Name: Jacob Joseph MRN: [id number] DOB: 1955-12-25 Today's Date: 03/11/2012 Time: 5621-3086 PT Time Calculation (min): 17 min  PT Assessment / Plan / Recommendation Comments on Treatment Session  Patient progressing with goals.     Follow Up Recommendations  Outpatient PT;Supervision/Assistance - 24 hour    Barriers to Discharge        Equipment Recommendations  None recommended by PT    Recommendations for Other Services    Frequency Min 3X/week   Plan Discharge plan remains appropriate;Frequency remains appropriate    Precautions / Restrictions Precautions Precautions: Fall   Pertinent Vitals/Pain     Mobility  Bed Mobility Supine to Sit: 6: Modified independent (Device/Increase time) Transfers Sit to Stand: 5: Supervision;From bed Stand to Sit: 5: Supervision;To toilet Details for Transfer Assistance: Cues for safe hand placement Ambulation/Gait Ambulation/Gait Assistance: 5: Supervision Ambulation Distance (Feet): 400 Feet Assistive device: Rolling walker Ambulation/Gait Assistance Details: Cues for upright posture. I switched out RW and his posture became better Gait Pattern: Step-through pattern;Decreased stride length Stairs: No Modified Rankin (Stroke Patients Only) Pre-Morbid Rankin Score: Moderately severe disability Modified Rankin: Moderately severe disability    Exercises     PT Diagnosis:    PT Problem List:   PT Treatment Interventions:     PT Goals Acute Rehab PT Goals PT Goal: Sit to Stand - Progress: Progressing toward goal PT Goal: Stand to Sit - Progress: Progressing toward goal PT Goal: Ambulate - Progress: Progressing toward goal  Visit Information  Last PT Received On: 03/11/12 Assistance Needed: +1    Subjective Data      Cognition  Overall Cognitive Status: Appears within functional limits for tasks assessed/performed Arousal/Alertness: Awake/alert Orientation Level: Appears intact  for tasks assessed Behavior During Session: [facility] for tasks performed    Balance     End of Session PT - End of Session Equipment Utilized During Treatment: Gait belt Activity Tolerance: Patient tolerated treatment well Patient left: in chair Nurse Communication: Mobility status   GP     Fredrich Birks 03/11/2012, 12:00 PM 03/11/2012 Fredrich Birks PTA [phone] pager [phone] office

## 2012-03-11 NOTE — Progress Notes (Signed)
Clinical Social Work-CSW left message for financial counseling per pt and wife request.  Wife very grateful for treatment team concern and will f/u with CSW as outpatient should any needs or concerns arise with regards to medication and financial resources.  Trula Ore Manuela Halbur-MSW,[phone]

## 2012-03-11 NOTE — Progress Notes (Signed)
Resident Co-sign Daily Note: I have seen the patient and reviewed the daily progress note by Kathrin Ruddy MS IV and discussed the care of the patient with them.  See below for documentation of my findings, assessment, and plans.  Subjective: Doing well today.  He has not had any more runs of narrow complex tachycardia.  No chest pain overnight and he states that he slept well it just took him a while to fall asleep.  He has some mild lightheadedness on standing yesterday that is improved today.   Objective: Vital signs in last 24 hours: Filed Vitals:   03/10/12 2200 03/10/12 2203 03/10/12 2205 03/11/12 0600  BP: 146/86 140/98 125/89 108/67  Pulse: 96 110 112 84  Temp: 99.4 F (37.4 C)   99 F (37.2 C)  TempSrc: Oral     Resp: 18   18  Height:      Weight:      SpO2: 98%   100%   Physical Exam: Vitals reviewed. General: resting in bed, NAD  HEENT: PERRL, EOMI, no scleral icterus  Cardiac: RRR, no rubs, murmurs or gallops  Pulm: clear to auscultation bilaterally, no wheezes, rales, or rhonchi  Abd: soft, nontender, nondistended, BS present  Ext: warm and well perfused, no pedal edema  Neuro: alert and oriented X3, cranial nerves II-XII grossly intact, sensation to light touch equal in bilateral upper and lower extremities except with the loss of sensation to light touch on right lateral foot. Strength in the right and left upper and lower extremities is 5/5 bilaterally. Moderate atrophy in the thenar, hypothenar, and interosseus of the right hand as well as the quadriceps near the right knee.   Lab Results: Reviewed and documented in Electronic Record Micro Results: Reviewed and documented in Electronic Record Studies/Results: Reviewed and documented in Electronic Record Medications: I have reviewed the patient's current medications. Scheduled Meds:   . aspirin EC  81 mg Oral Daily  . atorvastatin  10 mg Oral q1800  . clopidogrel  75 mg Oral Daily  . enoxaparin (LOVENOX)  injection  40 mg Subcutaneous Q24H  . feeding supplement  237 mL Oral BID BM  . insulin aspart  0-9 Units Subcutaneous TID WC  . lamiVUDine-zidovudine  1 tablet Oral BID  . lisinopril  10 mg Oral Daily  . lopinavir-ritonavir  2 tablet Oral BID  . metoprolol tartrate  25 mg Oral BID  . mirtazapine  30 mg Oral QHS  . nicotine  14 mg Transdermal Daily  . QUEtiapine  200 mg Oral QHS  . general admission iv infusion   Intravenous Once  . sodium chloride  1,000 mL Intravenous Once  . sodium chloride  3 mL Intravenous Q12H  . tenofovir  300 mg Oral Daily  . DISCONTD: mirtazapine  45 mg Oral QHS  . DISCONTD: QUEtiapine  300 mg Oral QHS   Continuous Infusions:  PRN Meds:.acetaminophen, acetaminophen, ondansetron (ZOFRAN) IV, ondansetron, oxyCODONE  Assessment/Plan: 1. Right sided weakness: On admission he presented with a story of increasing weakness on his right side initially concerning for a recurrence of a stroke or extension of the original stroke. He has improved from his presentation with resolution of slurred speech and increasing strength in the right sided extremities. PT and OT have seen him and recommended home PT and OT. CT and MRI were negative for acute infarction. Neurology has seen and suggested continuing ASA 325 and stopping plavix as well as cessation of cocaine and tobacco.  - Likely D/C  today.   2. Narrow complex Tachycardia: Just prior to discharge yesterday the patient had a recorded 6 minute run of a narrow complex tachycardia while he was up brushing his teeth.  He was asymptomatic at that time.  Dr. Algie Coffer came to see him and decided this was sinus tachycardia and that he thought it was likely secondary to his Seroquel and Remeron.  He suggested lowering the dose of those medications and starting low dose metoprolol.  Repeat EKG showed sinus rhythm and his cardiac enzymes were negative.  He we mildly orthostatic last night so he will get a 1L bolus of NS before he is  discharged today.   3. HIV infection: CD4 count is 540 and WBC has been mildly trending downward. We will continue his home HIV medications during this admission.    4. Transaminitis: On admission his elevated ALT/AST of 127/146. During his last admission they were 38/54. At that stay he was started on Lipitor 10 mg even though his LDL was in the 50s. He also is on Viread for his HIV and he also has a history of HCV.   - plan to d/c statin   - recheck at his follow up.   - Discuss with ID about changing him from Viread given the elevation in LFTs.   5. HTN: he was restarted on lisinopril 10 mg daily and after his episode of Sinus tachycardia last evening he was started on Metoprolol 25 mg BID.  He will need continued follow up as an outpatient with goal of BP <140/80.    6. Polysubstance abuse: UDS was negative but admits to cocaine use 1.5 weeks ago. He also smokes cigarettes. He was encouraged to continue cessation from both to limit his risk for repeat stroke.   7. Insomnia/Depression: He has a long history and has been on medications at their current doses.   8. CKD stage II: Currently at his baseline.   9. Chronic Back pain: Will likely D/C on Flexeril.   10. Dispo: Plan to D/C home today with follow up with Neuro, ID, and OPC.    LOS: 3 days   Maquita Sandoval 03/11/2012, 1:53 PM

## 2012-03-14 ENCOUNTER — Encounter: Payer: Medicaid Other | Admitting: Occupational Therapy

## 2012-03-14 ENCOUNTER — Encounter: Payer: Medicaid Other | Admitting: Internal Medicine

## 2012-03-14 ENCOUNTER — Ambulatory Visit (INDEPENDENT_AMBULATORY_CARE_PROVIDER_SITE_OTHER): Payer: Self-pay | Admitting: Internal Medicine

## 2012-03-14 ENCOUNTER — Encounter: Payer: Self-pay | Admitting: Internal Medicine

## 2012-03-14 VITALS — BP 117/77 | HR 69 | Temp 96.8°F | Ht 72.0 in | Wt 191.8 lb

## 2012-03-14 DIAGNOSIS — I6350 Cerebral infarction due to unspecified occlusion or stenosis of unspecified cerebral artery: Secondary | ICD-10-CM

## 2012-03-14 DIAGNOSIS — M549 Dorsalgia, unspecified: Secondary | ICD-10-CM

## 2012-03-14 DIAGNOSIS — B20 Human immunodeficiency virus [HIV] disease: Secondary | ICD-10-CM

## 2012-03-14 DIAGNOSIS — G8929 Other chronic pain: Secondary | ICD-10-CM

## 2012-03-14 DIAGNOSIS — I639 Cerebral infarction, unspecified: Secondary | ICD-10-CM

## 2012-03-14 DIAGNOSIS — B1710 Acute hepatitis C without hepatic coma: Secondary | ICD-10-CM

## 2012-03-14 DIAGNOSIS — R7401 Elevation of levels of liver transaminase levels: Secondary | ICD-10-CM

## 2012-03-14 DIAGNOSIS — B1920 Unspecified viral hepatitis C without hepatic coma: Secondary | ICD-10-CM

## 2012-03-14 DIAGNOSIS — F329 Major depressive disorder, single episode, unspecified: Secondary | ICD-10-CM

## 2012-03-14 DIAGNOSIS — F3289 Other specified depressive episodes: Secondary | ICD-10-CM

## 2012-03-14 DIAGNOSIS — I10 Essential (primary) hypertension: Secondary | ICD-10-CM

## 2012-03-14 MED ORDER — TRAMADOL HCL 50 MG PO TABS: 50.0000 mg | ORAL_TABLET | Freq: Four times a day (QID) | ORAL | Status: DC | PRN

## 2012-03-14 MED ORDER — HEPATITIS B VAC RECOMBINANT 5 MCG/0.5ML IJ SUSP: 0.5000 mL | Freq: Once | INTRAMUSCULAR | Status: DC

## 2012-03-14 NOTE — Progress Notes (Signed)
Subjective:   Patient ID: Jacob Joseph male   DOB: Sep 01, 1955 56 y.o.   MRN: [id number]  HPI: Jacob Joseph is a 56 y.o.man with past medical history most significant for HIV infection, Hepatitis C, diabetes, polysubstance abuse, hypertension , CKD II and recent stroke with residual right side weakness in May 2013. Patient was discharged from the hospital after recurrent stroke symptoms with negative workups. His Plavix was stopped since he is done with 3 months tx per neurology.  He is started on ASA 325 mg po daily. His Lipitor was also stopped due to elevated liver function.  This is a followup visit.  patient reports medical compliance with his medications at this time.   Recurrent stroke symptoms Feels ok. still goes his PT/OT. States that they are very helpful. Denies any new weakness, numbness or tingling.    Transaminitis: likely associated with Statin. See D/C summary on 03/11/12.  Diabetes: Controlled with diet   Back pain: Patient endorses back pain which is present in the lower back, 5/10 in intensity, no radiation, no fever or chills, no tingling numbness or radiation noted, no changes in bowel or bladder habits.   HTN his blood pressure is well-controlled with antihypertensives  Smoking  He continues to smoke cigarettes with at least a 35 pack year history. He currently smokes about 1/2 ppd. Stopped 10 days ago Uses nicotine patch now  Cocaine abuse He also uses cocaine about 1-2 times per week with his last use being about a week prior to admission. He states that this is much less then before his stroke in May.  HIV followed by Dr. Orvan Falconer from [facility].   He was diagnosed with HIV in 1984 and states that he has been taking his medications. His last CD4 in the system was 407 with an undetectable viral load. He states that all of his family members know of his diagnosis.      Past Medical History  Diagnosis Date  . Hypertension   . HIV (human immunodeficiency virus  infection)   . Depression   . Stroke    Current Outpatient Prescriptions  Medication Sig Dispense Refill  . aspirin 81 MG EC tablet Take 4 tablets (325 mg total) by mouth daily.  30 tablet  11  . cyclobenzaprine (FLEXERIL) 5 MG tablet Take 1 tablet (5 mg total) by mouth 3 (three) times daily as needed for muscle spasms.  30 tablet  1  . lamiVUDine-zidovudine (COMBIVIR) 150-300 MG per tablet Take 1 tablet by mouth 2 (two) times daily.      [name] Kitchen lisinopril (PRINIVIL,ZESTRIL) 10 MG tablet Take 1 tablet (10 mg total) by mouth daily.  30 tablet  1  . lopinavir-ritonavir (KALETRA) 200-50 MG per tablet Take 2 tablets by mouth 2 (two) times daily.      . metoprolol tartrate (LOPRESSOR) 25 MG tablet Take 1 tablet (25 mg total) by mouth 2 (two) times daily.  60 tablet  1  . mirtazapine (REMERON) 15 MG tablet Take 1 tablet (15 mg total) by mouth at bedtime.  30 tablet  1  . nicotine (NICODERM CQ - DOSED IN MG/24 HOURS) 14 mg/24hr patch Place 1 patch onto the skin daily.  28 patch  0  . QUEtiapine (SEROQUEL) 100 MG tablet Take 1 tablet (100 mg total) by mouth at bedtime.  30 tablet  1  . tenofovir (VIREAD) 300 MG tablet Take 300 mg by mouth daily.      [name] Kitchen zolpidem (AMBIEN) 10 MG tablet Take 10  mg by mouth at bedtime as needed. For sleep       Family History  Problem Relation Age of Onset  . Hypertension Mother   . Hypertension Father   . Cancer Father    History   Social History  . Marital Status: Married    Spouse Name: N/A    Number of Children: N/A  . Years of Education: N/A   Social History Main Topics  . Smoking status: Current Everyday Smoker -- 0.1 packs/day for 41 years    Types: Cigarettes    Last Attempt to Quit: 12/04/2011  . Smokeless tobacco: Never Used   Comment: quit smioking when he went in the hosp for CVA but restarted at 1/3rd pack per day  . Alcohol Use: 0.0 oz/week    0 drink(s) per week     last time 30 days ago per the pt  . Drug Use: Yes    Special: "Crack" cocaine,  Cocaine, Marijuana     every 2-3 days prior to stroke in May 2013, down to 1-2 times weekly after.   . Sexually Active: Yes -- Male partner(s)     decined condoms   Other Topics Concern  . Not on file   Social History Narrative   Pt lives with wife and grandkid in [place].Has applied for disability and is pending.Worked in [employer] before about 3 years ago.   Review of Systems: See HPI Objective:  Physical Exam: Filed Vitals:   03/14/12 1553  BP: 117/77  Pulse: 69  Temp: 96.8 F (36 C)  TempSrc: Oral  Height: 6' (1.829 m)  Weight: 191 lb 12.8 oz (87 kg)  SpO2: 100%   General: alert, well-developed, and cooperative to examination.  Head: normocephalic and atraumatic.  Eyes: vision grossly intact, pupils equal, pupils round, pupils reactive to light, no injection and anicteric.  Mouth: pharynx pink and moist, no erythema, and no exudates.  Neck: supple, full ROM, no thyromegaly, no JVD, and no carotid bruits.  Lungs: normal respiratory effort, no accessory muscle use, normal breath sounds, no crackles, and no wheezes. Heart: normal rate, regular rhythm, no murmur, no gallop, and no rub.  Abdomen: soft, non-tender, normal bowel sounds, no distention, no guarding, no rebound tenderness, no hepatomegaly, and no splenomegaly.  Msk: no joint swelling, no joint warmth, and no redness over joints. Mild paraspinal tenderness. SRLT negative Pulses: 2+ DP/PT pulses bilaterally Extremities: No cyanosis, clubbing, edema Neurologic: alert & oriented X3, cranial nerves II-XII intact, strength normal in all extremities, sensation intact to light touch, and gait normal.  Skin: turgor normal and no rashes.  Psych: Oriented X3, memory intact for recent and remote, normally interactive, good eye contact, not anxious appearing, and not depressed appearing.   Assessment & Plan:

## 2012-03-14 NOTE — Patient Instructions (Addendum)
1. Referral to hepatitis C clinic 2. Follow up in one month, will need hepatitis B vaccine in one month and 6 months

## 2012-03-15 ENCOUNTER — Encounter: Payer: Medicaid Other | Admitting: Occupational Therapy

## 2012-03-15 ENCOUNTER — Ambulatory Visit: Payer: Self-pay | Admitting: Occupational Therapy

## 2012-03-15 ENCOUNTER — Ambulatory Visit: Payer: Self-pay | Admitting: Physical Therapy

## 2012-03-15 LAB — COMPLETE METABOLIC PANEL WITH GFR
ALT: 102 U/L — ABNORMAL HIGH (ref 0–53)
AST: 121 U/L — ABNORMAL HIGH (ref 0–37)
Albumin: 3.7 g/dL (ref 3.5–5.2)
Alkaline Phosphatase: 95 U/L (ref 39–117)
GFR, Est Non African American: 77 mL/min
Glucose, Bld: 113 mg/dL — ABNORMAL HIGH (ref 70–99)
Potassium: 4.3 mEq/L (ref 3.5–5.3)
Sodium: 136 mEq/L (ref 135–145)
Total Protein: 7.6 g/dL (ref 6.0–8.3)

## 2012-03-15 NOTE — Assessment & Plan Note (Signed)
Well controlled.  - check CMP - continue current regimen

## 2012-03-15 NOTE — Assessment & Plan Note (Signed)
Feels ok. Residual right sided weakness. Still goes to PT/OT. Was on aspirin and Plavix.  His Plavix was stopped by neurologist during his admission of August, 2013 since he had 3 months of treatment.  His aspirin was changed to 325 mg by mouth daily upon discharge.  - Continue aspirin for antiplatelet therapy -Continue PT/OT.

## 2012-03-15 NOTE — Assessment & Plan Note (Signed)
Chronic lower back pain with no s/s cord compression. He was treated with Oxycodone and flexeril PRN with satisfactory relief during his last admission.   He is discharged home with Flexeril. He goes to PT/OT which is helpful to his back pain. He also would like to have stronger pain medication as well.   - given his history Polysubstance abuse and possible controlled substance abuse ( see FYI), will avoid controlled substance - will give him short course of Tramadol PRN Continue his flexeril, PT/OT

## 2012-03-15 NOTE — Assessment & Plan Note (Signed)
Well controlled. Good sleep. Denies depressed mood or SI/HI - continue current regimen

## 2012-03-15 NOTE — Assessment & Plan Note (Signed)
history of hepatitis C with normal ALT and mildly elevated AST at 40s in the past. His liver enzymes are elevated since May 2013 when he was put on statin Lipitor after he was diagnosed with stroke.  He has extremely low LDL from 19-55.  The risk associated with taking a statin exceeded the benefit of anti-inflammatory properties of statins.  Therefore his Lipitor was discontinued. -Will repeat LFTs -Patient will need close monitoring of LFTs

## 2012-03-15 NOTE — Assessment & Plan Note (Signed)
He was diagnosed with HIV in 1984 and states that he has been taking his medications. His last CD4 in the system was 407 with an undetectable viral load. He states that all of his family members know of his diagnosis.   - continue to follow up with Dr. Orvan Falconer .

## 2012-03-15 NOTE — Assessment & Plan Note (Signed)
History of hepatitis C and HIV.  His [facility] is [facility].  - Will ask Dr. Orvan Falconer to manage his hepatitis C as well - Patient had the hepatitis B vaccination in 2004.  Unfortunately he did not develop immunity to Hepatitis B as evidenced by negative hepatitis B surface antibody.  Will give him hepatitis B vaccination again - he will need 2nd vaccine in one month and third in 6 months

## 2012-03-16 ENCOUNTER — Ambulatory Visit: Payer: Self-pay | Admitting: *Deleted

## 2012-03-17 ENCOUNTER — Ambulatory Visit: Payer: Self-pay | Admitting: Physical Therapy

## 2012-03-23 ENCOUNTER — Encounter: Payer: Medicaid Other | Admitting: Occupational Therapy

## 2012-03-23 ENCOUNTER — Other Ambulatory Visit: Payer: Self-pay | Admitting: *Deleted

## 2012-03-23 ENCOUNTER — Encounter (INDEPENDENT_AMBULATORY_CARE_PROVIDER_SITE_OTHER): Payer: Self-pay | Admitting: General Surgery

## 2012-03-23 ENCOUNTER — Ambulatory Visit: Payer: Medicaid Other | Attending: Internal Medicine | Admitting: Physical Therapy

## 2012-03-23 ENCOUNTER — Ambulatory Visit: Payer: Medicaid Other

## 2012-03-23 ENCOUNTER — Ambulatory Visit (INDEPENDENT_AMBULATORY_CARE_PROVIDER_SITE_OTHER): Payer: Self-pay | Admitting: General Surgery

## 2012-03-23 VITALS — BP 128/92 | HR 67 | Temp 98.4°F | Ht 72.0 in | Wt 189.8 lb

## 2012-03-23 DIAGNOSIS — M6281 Muscle weakness (generalized): Secondary | ICD-10-CM | POA: Insufficient documentation

## 2012-03-23 DIAGNOSIS — I69998 Other sequelae following unspecified cerebrovascular disease: Secondary | ICD-10-CM | POA: Insufficient documentation

## 2012-03-23 DIAGNOSIS — Z9889 Other specified postprocedural states: Secondary | ICD-10-CM

## 2012-03-23 DIAGNOSIS — IMO0001 Reserved for inherently not codable concepts without codable children: Secondary | ICD-10-CM | POA: Insufficient documentation

## 2012-03-23 DIAGNOSIS — Z8719 Personal history of other diseases of the digestive system: Secondary | ICD-10-CM

## 2012-03-23 MED ORDER — METOPROLOL TARTRATE 25 MG PO TABS: 25.0000 mg | ORAL_TABLET | Freq: Two times a day (BID) | ORAL | Status: DC

## 2012-03-23 NOTE — Progress Notes (Signed)
Subjective:     Patient ID: Jacob Joseph, male   DOB: [birth year], 56 y.o.   MRN: [id number]  HPI Patient underwent right inguinal hernia repair with mesh. He is doing very well postoperatively. He is no longer taking pain medication.  Review of Systems     Objective:   Physical Exam Incision is clean dry and intact. No evidence of infection. Hernia repair is intact. No significant testicular edema or tenderness.    Assessment:     Doing very well status post right inguinal hernia repair with mesh    Plan:     Avoid heavy lifting for a total of 6 weeks after surgery. Return when necessary.

## 2012-03-23 NOTE — Telephone Encounter (Signed)
Rx faxed

## 2012-03-23 NOTE — Telephone Encounter (Signed)
Will fax to [facility] pharmacy [phone] Med is free with this pharmacy

## 2012-03-24 ENCOUNTER — Encounter: Payer: Self-pay | Admitting: Internal Medicine

## 2012-03-24 ENCOUNTER — Ambulatory Visit (INDEPENDENT_AMBULATORY_CARE_PROVIDER_SITE_OTHER): Payer: Self-pay | Admitting: Internal Medicine

## 2012-03-24 ENCOUNTER — Other Ambulatory Visit: Payer: Self-pay | Admitting: *Deleted

## 2012-03-24 ENCOUNTER — Ambulatory Visit: Payer: Self-pay

## 2012-03-24 VITALS — BP 151/93 | HR 61 | Temp 97.9°F | Ht 72.0 in | Wt 190.5 lb

## 2012-03-24 DIAGNOSIS — F329 Major depressive disorder, single episode, unspecified: Secondary | ICD-10-CM

## 2012-03-24 DIAGNOSIS — B20 Human immunodeficiency virus [HIV] disease: Secondary | ICD-10-CM

## 2012-03-24 DIAGNOSIS — I10 Essential (primary) hypertension: Secondary | ICD-10-CM

## 2012-03-24 MED ORDER — LISINOPRIL 10 MG PO TABS: 10.0000 mg | ORAL_TABLET | Freq: Every day | ORAL | Status: DC

## 2012-03-24 MED ORDER — MIRTAZAPINE 15 MG PO TABS: 15.0000 mg | ORAL_TABLET | Freq: Every day | ORAL | Status: DC

## 2012-03-24 NOTE — Progress Notes (Signed)
Patient ID: Jacob Joseph, male   DOB: 1955-09-21, 56 y.o.   MRN: [id number]     [facility] for Infectious Disease  Patient Active Problem List  Diagnosis  . TUBERCULOSIS  . HIV INFECTION  . HEPATITIS C  . DIABETES MELLITUS, TYPE II  . ERECTILE DYSFUNCTION  . TOBACCO USER  . Polysubstance abuse  . INSOMNIA, CHRONIC  . DEPRESSION  . ESSENTIAL HYPERTENSION  . DEGENERATIVE JOINT DISEASE  . DEGENERATIVE DISC DISEASE, LUMBAR SPINE  . LEG PAIN, LEFT  . Inguinal hernia recurrent unilateral  . CVA (cerebral infarction)  . Calcium oxalate crystals in urine  . Ingrown toenail without infection  . Incarcerated inguinal hernia, unilateral  . Right sided weakness  . Normocytic anemia  . Leukopenia  . CKD (chronic kidney disease), stage II  . Elevated LFTs  . Hypokalemia  . Chronic back pain  . Sinus tachycardia  . Transaminitis  . S/P right inguinal hernia repair    Patient's Medications  New Prescriptions   No medications on file  Previous Medications   ASPIRIN 81 MG EC TABLET    Take 4 tablets (325 mg total) by mouth daily.   LAMIVUDINE-ZIDOVUDINE (COMBIVIR) 150-300 MG PER TABLET    Take 1 tablet by mouth 2 (two) times daily.   LISINOPRIL (PRINIVIL,ZESTRIL) 10 MG TABLET    Take 1 tablet (10 mg total) by mouth daily.   LOPINAVIR-RITONAVIR (KALETRA) 200-50 MG PER TABLET    Take 2 tablets by mouth 2 (two) times daily.   MIRTAZAPINE (REMERON) 15 MG TABLET    Take 1 tablet (15 mg total) by mouth at bedtime.   NICOTINE (NICODERM CQ - DOSED IN MG/24 HOURS) 14 MG/24HR PATCH    Place 1 patch onto the skin daily.   QUETIAPINE (SEROQUEL) 100 MG TABLET    Take 1 tablet (100 mg total) by mouth at bedtime.   TENOFOVIR (VIREAD) 300 MG TABLET    Take 300 mg by mouth daily.   TRAMADOL (ULTRAM) 50 MG TABLET    Take 1 tablet (50 mg total) by mouth every 6 (six) hours as needed for pain.   ZOLPIDEM (AMBIEN) 10 MG TABLET    Take 10 mg by mouth at bedtime as needed. For sleep  Modified  Medications   No medications on file  Discontinued Medications   METOPROLOL TARTRATE (LOPRESSOR) 25 MG TABLET    Take 1 tablet (25 mg total) by mouth 2 (two) times daily.    Subjective: Jacob Joseph is in for his routine visit. Following his last visit with me he developed an incarcerated right inguinal hernia and was admitted to the hospital for mesh repair. He has recovered nicely from that. He was readmitted following surgery with recurrent stroke syndrome and increased right-sided weakness. His urine drug screen was again cocaine positive. He states that he has not used any cocaine since his latest stroke and he has not had a cigarette in 3 weeks. He states that he has not missed any doses of his Combivir, Viread, or Kaletra since his last visit. He also states that he has not missed any doses of his lisinopril or metoprolol. He has been out of some of his other medications because he cannot afford them.  Objective: Temp: 97.9 F (36.6 C) (09/05 0846) Temp src: Oral (09/05 0846) BP: 151/93 mmHg (09/05 0846) Pulse Rate: 61  (09/05 0846)  General: Somewhat of a flat affect but is in no distress Skin: No rash Lungs: Clear Cor: Regular S1 and S2  no murmurs Abdomen: Nontender Residual weakness in his right arm and leg with some muscle wasting in his right hand  Lab Results HIV 1 RNA Quant (copies/mL)  Date Value  03/08/2012 <20   11/30/2011 <20   06/25/2011 <20      CD4 T Cell Abs (cmm)  Date Value  03/14/2012 490   03/08/2012 540   11/30/2011 410      Assessment: His HIV infection remains under excellent control. I will continue his current regimen. I encouraged him to stay abstinent from cocaine and off of cigarettes. We will help him get whatever medicines he came in through the a drug assistance program and pharmaceutical assistance programs.  Plan: 1. Continue current antiretroviral regimen 2. Return after lab work in 6 months   Cliffton Asters, MD [facility] for Infectious  Disease [facility] [phone] pager   [phone] cell 03/24/2012, 9:03 AM

## 2012-03-25 ENCOUNTER — Ambulatory Visit: Payer: Medicaid Other | Admitting: Occupational Therapy

## 2012-03-25 ENCOUNTER — Ambulatory Visit: Payer: Medicaid Other | Admitting: *Deleted

## 2012-03-29 ENCOUNTER — Ambulatory Visit: Payer: Medicaid Other | Admitting: Physical Therapy

## 2012-03-29 ENCOUNTER — Other Ambulatory Visit: Payer: Self-pay | Admitting: Internal Medicine

## 2012-03-29 ENCOUNTER — Ambulatory Visit: Payer: Medicaid Other | Admitting: Occupational Therapy

## 2012-03-29 ENCOUNTER — Ambulatory Visit: Payer: Medicaid Other

## 2012-03-29 DIAGNOSIS — B1920 Unspecified viral hepatitis C without hepatic coma: Secondary | ICD-10-CM

## 2012-03-31 ENCOUNTER — Ambulatory Visit: Payer: Medicaid Other

## 2012-03-31 ENCOUNTER — Ambulatory Visit: Payer: Medicaid Other | Admitting: Physical Therapy

## 2012-03-31 ENCOUNTER — Ambulatory Visit: Payer: Medicaid Other | Admitting: Occupational Therapy

## 2012-04-04 ENCOUNTER — Ambulatory Visit: Payer: Medicaid Other | Admitting: Occupational Therapy

## 2012-04-04 ENCOUNTER — Ambulatory Visit: Payer: Medicaid Other

## 2012-04-04 ENCOUNTER — Ambulatory Visit: Payer: Medicaid Other | Admitting: Physical Therapy

## 2012-04-06 ENCOUNTER — Ambulatory Visit: Payer: Medicaid Other | Admitting: Speech Pathology

## 2012-04-06 ENCOUNTER — Other Ambulatory Visit (INDEPENDENT_AMBULATORY_CARE_PROVIDER_SITE_OTHER): Payer: Self-pay

## 2012-04-06 ENCOUNTER — Ambulatory Visit: Payer: Medicaid Other | Admitting: Occupational Therapy

## 2012-04-06 ENCOUNTER — Ambulatory Visit: Payer: Medicaid Other | Admitting: Physical Therapy

## 2012-04-06 DIAGNOSIS — B1920 Unspecified viral hepatitis C without hepatic coma: Secondary | ICD-10-CM

## 2012-04-07 LAB — HEPATITIS C GENOTYPE

## 2012-04-11 ENCOUNTER — Ambulatory Visit: Payer: Medicaid Other | Admitting: Occupational Therapy

## 2012-04-11 ENCOUNTER — Ambulatory Visit: Payer: Medicaid Other | Admitting: Physical Therapy

## 2012-04-11 ENCOUNTER — Ambulatory Visit: Payer: Medicaid Other | Admitting: *Deleted

## 2012-04-13 ENCOUNTER — Ambulatory Visit: Payer: Medicaid Other | Admitting: Physical Therapy

## 2012-04-13 ENCOUNTER — Ambulatory Visit: Payer: Medicaid Other | Admitting: Occupational Therapy

## 2012-04-13 ENCOUNTER — Ambulatory Visit: Payer: Medicaid Other

## 2012-04-20 ENCOUNTER — Ambulatory Visit: Payer: Self-pay | Admitting: Physical Therapy

## 2012-04-20 ENCOUNTER — Encounter: Payer: Self-pay | Admitting: Occupational Therapy

## 2012-04-22 ENCOUNTER — Ambulatory Visit: Payer: Medicaid Other | Attending: Internal Medicine

## 2012-04-22 ENCOUNTER — Ambulatory Visit: Payer: Medicaid Other | Admitting: Occupational Therapy

## 2012-04-22 DIAGNOSIS — M6281 Muscle weakness (generalized): Secondary | ICD-10-CM | POA: Insufficient documentation

## 2012-04-22 DIAGNOSIS — I69998 Other sequelae following unspecified cerebrovascular disease: Secondary | ICD-10-CM | POA: Insufficient documentation

## 2012-04-22 DIAGNOSIS — IMO0001 Reserved for inherently not codable concepts without codable children: Secondary | ICD-10-CM | POA: Insufficient documentation

## 2012-04-25 ENCOUNTER — Ambulatory Visit: Payer: Medicaid Other | Admitting: Occupational Therapy

## 2012-04-25 ENCOUNTER — Ambulatory Visit: Payer: Medicaid Other | Admitting: Speech Pathology

## 2012-04-28 ENCOUNTER — Encounter: Payer: Self-pay | Admitting: Occupational Therapy

## 2012-05-02 ENCOUNTER — Encounter: Payer: Self-pay | Admitting: Occupational Therapy

## 2012-05-02 ENCOUNTER — Ambulatory Visit: Payer: Medicaid Other

## 2012-05-02 ENCOUNTER — Ambulatory Visit: Payer: Medicaid Other | Admitting: Physical Therapy

## 2012-05-03 ENCOUNTER — Ambulatory Visit (INDEPENDENT_AMBULATORY_CARE_PROVIDER_SITE_OTHER): Payer: Medicaid Other | Admitting: *Deleted

## 2012-05-03 DIAGNOSIS — Z23 Encounter for immunization: Secondary | ICD-10-CM

## 2012-05-04 ENCOUNTER — Encounter: Payer: Self-pay | Admitting: Occupational Therapy

## 2012-05-04 ENCOUNTER — Ambulatory Visit: Payer: Medicaid Other | Admitting: Speech Pathology

## 2012-05-10 ENCOUNTER — Ambulatory Visit: Payer: Medicaid Other

## 2012-05-12 ENCOUNTER — Ambulatory Visit: Payer: Medicaid Other | Admitting: Speech Pathology

## 2012-05-16 ENCOUNTER — Ambulatory Visit: Payer: Medicaid Other | Admitting: Speech Pathology

## 2012-05-18 ENCOUNTER — Ambulatory Visit: Payer: Medicaid Other

## 2012-05-24 ENCOUNTER — Telehealth: Payer: Self-pay | Admitting: *Deleted

## 2012-05-24 DIAGNOSIS — F329 Major depressive disorder, single episode, unspecified: Secondary | ICD-10-CM

## 2012-05-24 MED ORDER — MIRTAZAPINE 15 MG PO TABS: 15.0000 mg | ORAL_TABLET | Freq: Every day | ORAL | Status: DC

## 2012-05-26 ENCOUNTER — Other Ambulatory Visit: Payer: Self-pay | Admitting: *Deleted

## 2012-05-26 ENCOUNTER — Other Ambulatory Visit: Payer: Self-pay | Admitting: Internal Medicine

## 2012-05-26 MED ORDER — METOPROLOL TARTRATE 25 MG PO TABS: 25.0000 mg | ORAL_TABLET | Freq: Two times a day (BID) | ORAL | Status: DC

## 2012-05-26 NOTE — Telephone Encounter (Signed)
refill is for metoprolol 25 mg.  Last refilled 9/4 with 1 refill.  Now not on med list.

## 2012-05-26 NOTE — Telephone Encounter (Signed)
Prescription sent to [facility] listed on his chart.

## 2012-05-30 ENCOUNTER — Encounter: Payer: Self-pay | Admitting: Internal Medicine

## 2012-05-30 ENCOUNTER — Ambulatory Visit (INDEPENDENT_AMBULATORY_CARE_PROVIDER_SITE_OTHER): Payer: Medicaid Other | Admitting: Internal Medicine

## 2012-05-30 ENCOUNTER — Telehealth: Payer: Self-pay | Admitting: Internal Medicine

## 2012-05-30 VITALS — BP 111/73 | HR 69 | Temp 97.5°F | Ht 72.0 in | Wt 187.2 lb

## 2012-05-30 DIAGNOSIS — Z299 Encounter for prophylactic measures, unspecified: Secondary | ICD-10-CM | POA: Insufficient documentation

## 2012-05-30 DIAGNOSIS — E875 Hyperkalemia: Secondary | ICD-10-CM

## 2012-05-30 DIAGNOSIS — Z23 Encounter for immunization: Secondary | ICD-10-CM

## 2012-05-30 DIAGNOSIS — I10 Essential (primary) hypertension: Secondary | ICD-10-CM

## 2012-05-30 DIAGNOSIS — R531 Weakness: Secondary | ICD-10-CM

## 2012-05-30 DIAGNOSIS — G8929 Other chronic pain: Secondary | ICD-10-CM

## 2012-05-30 DIAGNOSIS — I639 Cerebral infarction, unspecified: Secondary | ICD-10-CM

## 2012-05-30 DIAGNOSIS — M549 Dorsalgia, unspecified: Secondary | ICD-10-CM

## 2012-05-30 DIAGNOSIS — M6281 Muscle weakness (generalized): Secondary | ICD-10-CM

## 2012-05-30 DIAGNOSIS — B1920 Unspecified viral hepatitis C without hepatic coma: Secondary | ICD-10-CM

## 2012-05-30 DIAGNOSIS — I6350 Cerebral infarction due to unspecified occlusion or stenosis of unspecified cerebral artery: Secondary | ICD-10-CM

## 2012-05-30 DIAGNOSIS — M5137 Other intervertebral disc degeneration, lumbosacral region: Secondary | ICD-10-CM

## 2012-05-30 DIAGNOSIS — B1710 Acute hepatitis C without hepatic coma: Secondary | ICD-10-CM

## 2012-05-30 LAB — COMPLETE METABOLIC PANEL WITH GFR
ALT: 102 U/L — ABNORMAL HIGH (ref 0–53)
AST: 135 U/L — ABNORMAL HIGH (ref 0–37)
Albumin: 3.3 g/dL — ABNORMAL LOW (ref 3.5–5.2)
Alkaline Phosphatase: 80 U/L (ref 39–117)
BUN: 10 mg/dL (ref 6–23)
CO2: 18 mEq/L — ABNORMAL LOW (ref 19–32)
Calcium: 9.5 mg/dL (ref 8.4–10.5)
Chloride: 100 mEq/L (ref 96–112)
Creat: 1.1 mg/dL (ref 0.50–1.35)
GFR, Est African American: 87 mL/min
GFR, Est Non African American: 75 mL/min
Glucose, Bld: 103 mg/dL — ABNORMAL HIGH (ref 70–99)
Potassium: 5.9 mEq/L — ABNORMAL HIGH (ref 3.5–5.3)
Sodium: 136 mEq/L (ref 135–145)
Total Bilirubin: 0.4 mg/dL (ref 0.3–1.2)
Total Protein: 8.4 g/dL — ABNORMAL HIGH (ref 6.0–8.3)

## 2012-05-30 MED ORDER — MELOXICAM 15 MG PO TABS: 7.5000 mg | ORAL_TABLET | Freq: Every day | ORAL | Status: DC

## 2012-05-30 NOTE — Progress Notes (Signed)
  Subjective:    Patient ID: Jacob Joseph, male    DOB: 11-11-1955, 56 y.o.   MRN: [id number]  HPI [name] is a 56 y.o.man with past medical history most significant for HIV infection, Hepatitis C, diabetes, polysubstance abuse, hypertension , CKD II and recent stroke with residual right side weakness in May 2013.  Patient is here today for regular followup.  He currently complains of back pain which is 10 out of 10 at its worse, present in the lower back, radiates to the left leg, no exacerbating or relieving factors, the tramadol which was prescribed did not help much. Patient does not have any numbness or weakness in the left leg. No changes in urination or no difficulty in defecation. No fever or chills noted. Pain has been present for many years.  Blood pressure is very well-controlled at this time.  Hepatitis C: He is scheduled to see a liver specialist in [facility] but at the same time request a referral to liver clinic in [place].  Preventive measures: Needs DTaP and colonoscopy referral.      Review of Systems  Constitutional: Negative for fever, activity change and appetite change.  HENT: Negative for sore throat.   Respiratory: Negative for cough and shortness of breath.   Cardiovascular: Negative for chest pain and leg swelling.  Gastrointestinal: Negative for nausea, abdominal pain, diarrhea, constipation and abdominal distention.  Genitourinary: Negative for frequency, hematuria and difficulty urinating.  Musculoskeletal: Positive for back pain. Negative for gait problem.  Neurological: Negative for dizziness and headaches.  Psychiatric/Behavioral: Negative for suicidal ideas and behavioral problems.       Objective:   Physical Exam  Constitutional: He is oriented to person, place, and time. He appears well-developed and well-nourished.  HENT:  Head: Normocephalic and atraumatic.  Eyes: Conjunctivae normal and EOM are normal. Pupils are equal,  round, and reactive to light. No scleral icterus.  Neck: Normal range of motion. Neck supple. No JVD present. No thyromegaly present.  Cardiovascular: Normal rate, regular rhythm, normal heart sounds and intact distal pulses.  Exam reveals no gallop and no friction rub.   No murmur heard. Pulmonary/Chest: Effort normal and breath sounds normal. No respiratory distress. He has no wheezes. He has no rales.  Abdominal: Soft. Bowel sounds are normal. He exhibits no distension and no mass. There is no tenderness. There is no rebound and no guarding.  Musculoskeletal: Normal range of motion. He exhibits tenderness. He exhibits no edema.       Straight leg raising test was negative bilaterally. Some localized tenderness noted over the paraspinal muscles in the lower back.  Lymphadenopathy:    He has no cervical adenopathy.  Neurological: He is alert and oriented to person, place, and time. He displays normal reflexes. No cranial nerve deficit. He exhibits abnormal muscle tone (Right hand). Coordination normal.  Psychiatric: He has a normal mood and affect. His behavior is normal.          Assessment & Plan:

## 2012-05-30 NOTE — Assessment & Plan Note (Addendum)
Patient has had pain for many years. He was on narcotics which were discontinued due to frequent cocaine use in the past. Patient has not used cocaine recently. His pain is persistent, 10/10, radiating to left leg without any weakness or numbness of foot. SLRT was negative. Xray of the back reviewed and show mild degenerative changes.  Will prescribe an NSAID today Mobic 15 mg bid. Local heat therapy 2-3 times a day. Consult PM and R for further evaluation for invasive vs medical management. No indication for further imaging studies at this time.

## 2012-05-30 NOTE — Assessment & Plan Note (Signed)
Obtain CMP. Patient would like to be referred to the new liver clinic in town by [facility]. Will call back in 1 month for referral.

## 2012-05-30 NOTE — Patient Instructions (Signed)
Back Pain, Adult Low back pain is very common. About 1 in 5 people have back pain.The cause of low back pain is rarely dangerous. The pain often gets better over time.About half of people with a sudden onset of back pain feel better in just 2 weeks. About 8 in 10 people feel better by 6 weeks.  CAUSES Some common causes of back pain include:  Strain of the muscles or ligaments supporting the spine.  Wear and tear (degeneration) of the spinal discs.  Arthritis.  Direct injury to the back. DIAGNOSIS Most of the time, the direct cause of low back pain is not known.However, back pain can be treated effectively even when the exact cause of the pain is unknown.Answering your caregiver's questions about your overall health and symptoms is one of the most accurate ways to make sure the cause of your pain is not dangerous. If your caregiver needs more information, he or she may order lab work or imaging tests (X-rays or MRIs).However, even if imaging tests show changes in your back, this usually does not require surgery. HOME CARE INSTRUCTIONS For many people, back pain returns.Since low back pain is rarely dangerous, it is often a condition that people can learn to manageon their own.   Remain active. It is stressful on the back to sit or stand in one place. Do not sit, drive, or stand in one place for more than 30 minutes at a time. Take short walks on level surfaces as soon as pain allows.Try to increase the length of time you walk each day.  Do not stay in bed.Resting more than 1 or 2 days can delay your recovery.  Do not avoid exercise or work.Your body is made to move.It is not dangerous to be active, even though your back may hurt.Your back will likely heal faster if you return to being active before your pain is gone.  Pay attention to your body when you bend and lift. Many people have less discomfortwhen lifting if they bend their knees, keep the load close to their bodies,and  avoid twisting. Often, the most comfortable positions are those that put less stress on your recovering back.  Find a comfortable position to sleep. Use a firm mattress and lie on your side with your knees slightly bent. If you lie on your back, put a pillow under your knees.  Only take over-the-counter or prescription medicines as directed by your caregiver. Over-the-counter medicines to reduce pain and inflammation are often the most helpful.Your caregiver may prescribe muscle relaxant drugs.These medicines help dull your pain so you can more quickly return to your normal activities and healthy exercise.  Put ice on the injured area.  Put ice in a plastic bag.  Place a towel between your skin and the bag.  Leave the ice on for 15 to 20 minutes, 3 to 4 times a day for the first 2 to 3 days. After that, ice and heat may be alternated to reduce pain and spasms.  Ask your caregiver about trying back exercises and gentle massage. This may be of some benefit.  Avoid feeling anxious or stressed.Stress increases muscle tension and can worsen back pain.It is important to recognize when you are anxious or stressed and learn ways to manage it.Exercise is a great option. SEEK MEDICAL CARE IF:  You have pain that is not relieved with rest or medicine.  You have pain that does not improve in 1 week.  You have new symptoms.  You are generally   not feeling well. SEEK IMMEDIATE MEDICAL CARE IF:   You have pain that radiates from your back into your legs.  You develop new bowel or bladder control problems.  You have unusual weakness or numbness in your arms or legs.  You develop nausea or vomiting.  You develop abdominal pain.  You feel faint. Document Released: 07/06/2005 Document Revised: 01/05/2012 Document Reviewed: 11/24/2010 [employer] Patient Information 2013 [employer].  

## 2012-05-30 NOTE — Assessment & Plan Note (Signed)
DTaP today. Referral to colonoscopy for screening for colon cancer.

## 2012-05-30 NOTE — Telephone Encounter (Signed)
Patient called about his k results and told to stop lisinopril. I will also have him come back tomorrow morning for a lab draw at 9 am. He agrees with the above plan.

## 2012-05-30 NOTE — Assessment & Plan Note (Signed)
Patient has had pain for many years. He was on narcotics which were discontinued due to frequent cocaine use in the past. Patient has not used cocaine recently. His pain is persistent, 10/10, radiating to left leg without any weakness or numbness of foot. SLRT was negative. Xray of the back reviewed and show mild degenerative changes.  Will prescribe an NSAID today Mobic 15 mg bid. Local heat therapy 2-3 times a day. Consult PM and R for further evaluation for invasive vs medical management. No indication for further imaging studies at this time. 

## 2012-05-30 NOTE — Assessment & Plan Note (Signed)
Well controlled on current medications. Obtain CMP

## 2012-05-30 NOTE — Assessment & Plan Note (Signed)
BP is well controlled. On aspirin for secondary prevention. Lipid panel due per Dr. Orvan Falconer and placed as future order. Encouraged to avoid cocaine.

## 2012-05-30 NOTE — Assessment & Plan Note (Signed)
Much improved

## 2012-05-31 LAB — PRESCRIPTION ABUSE MONITORING 15P, URINE
Amphetamine/Meth: NEGATIVE ng/mL
Barbiturate Screen, Urine: NEGATIVE ng/mL
Benzodiazepine Screen, Urine: NEGATIVE ng/mL
Buprenorphine, Urine: NEGATIVE ng/mL
Fentanyl, Ur: NEGATIVE ng/mL
Meperidine, Ur: NEGATIVE ng/mL
Methadone Screen, Urine: NEGATIVE ng/mL
Propoxyphene: NEGATIVE ng/mL
Tramadol Scrn, Ur: NEGATIVE ng/mL

## 2012-06-01 LAB — COCAINE METABOLITE (GC/LC/MS), URINE: Benzoylecgonine GC/MS Conf: 2182 ng/mL

## 2012-06-02 ENCOUNTER — Other Ambulatory Visit: Payer: Self-pay | Admitting: *Deleted

## 2012-06-02 DIAGNOSIS — I10 Essential (primary) hypertension: Secondary | ICD-10-CM

## 2012-06-02 MED ORDER — LISINOPRIL 10 MG PO TABS: 10.0000 mg | ORAL_TABLET | Freq: Every day | ORAL | Status: DC

## 2012-06-07 ENCOUNTER — Other Ambulatory Visit (INDEPENDENT_AMBULATORY_CARE_PROVIDER_SITE_OTHER): Payer: Medicaid Other

## 2012-06-07 DIAGNOSIS — B20 Human immunodeficiency virus [HIV] disease: Secondary | ICD-10-CM

## 2012-06-08 LAB — COMPREHENSIVE METABOLIC PANEL
Albumin: 3.5 g/dL (ref 3.5–5.2)
BUN: 19 mg/dL (ref 6–23)
Calcium: 9.1 mg/dL (ref 8.4–10.5)
Chloride: 105 mEq/L (ref 96–112)
Glucose, Bld: 98 mg/dL (ref 70–99)
Potassium: 3.9 mEq/L (ref 3.5–5.3)
Sodium: 136 mEq/L (ref 135–145)
Total Protein: 7.6 g/dL (ref 6.0–8.3)

## 2012-06-08 LAB — RPR

## 2012-06-08 LAB — CBC
Hemoglobin: 12.2 g/dL — ABNORMAL LOW (ref 13.0–17.0)
MCH: 32.5 pg (ref 26.0–34.0)
MCHC: 34.3 g/dL (ref 30.0–36.0)
Platelets: 195 10*3/uL (ref 150–400)
RDW: 15.1 % (ref 11.5–15.5)

## 2012-06-08 LAB — LIPID PANEL
HDL: 33 mg/dL — ABNORMAL LOW (ref >39)
Triglycerides: 231 mg/dL — ABNORMAL HIGH (ref <150)

## 2012-06-08 LAB — T-HELPER CELL (CD4) - (RCID CLINIC ONLY): CD4 T Cell Abs: 450 uL (ref 400–2700)

## 2012-06-11 LAB — HIV-1 RNA QUANT-NO REFLEX-BLD
HIV 1 RNA Quant: <20 copies/mL (ref <20)
HIV-1 RNA Quant, Log: <1.3 {Log} (ref <1.30)

## 2012-06-21 ENCOUNTER — Ambulatory Visit (INDEPENDENT_AMBULATORY_CARE_PROVIDER_SITE_OTHER): Payer: Medicaid Other | Admitting: Internal Medicine

## 2012-06-21 ENCOUNTER — Telehealth: Payer: Self-pay | Admitting: *Deleted

## 2012-06-21 ENCOUNTER — Encounter: Payer: Self-pay | Admitting: Internal Medicine

## 2012-06-21 VITALS — BP 120/81 | HR 67 | Temp 98.0°F | Ht 72.0 in | Wt 190.5 lb

## 2012-06-21 DIAGNOSIS — B20 Human immunodeficiency virus [HIV] disease: Secondary | ICD-10-CM

## 2012-06-21 LAB — BASIC METABOLIC PANEL
BUN: 12 mg/dL (ref 6–23)
Chloride: 108 mEq/L (ref 96–112)
Potassium: 4.4 mEq/L (ref 3.5–5.3)
Sodium: 139 mEq/L (ref 135–145)

## 2012-06-21 NOTE — Telephone Encounter (Signed)
PCS paperwork completed by Dr. Orvan Falconer   Message left requesting pt to call if he wants to have the paperwork mailed or to be picked up at the front desk.

## 2012-06-21 NOTE — Progress Notes (Signed)
Patient ID: Jacob Joseph, male   DOB: 1955-10-01, 56 y.o.   MRN: [id number]     [facility] for Infectious Disease  Patient Active Problem List  Diagnosis  . TUBERCULOSIS  . HIV INFECTION  . HEPATITIS C  . DIABETES MELLITUS, TYPE II  . ERECTILE DYSFUNCTION  . TOBACCO USER  . Polysubstance abuse  . INSOMNIA, CHRONIC  . DEPRESSION  . ESSENTIAL HYPERTENSION  . DEGENERATIVE JOINT DISEASE  . DEGENERATIVE DISC DISEASE, LUMBAR SPINE  . LEG PAIN, LEFT  . CVA (cerebral infarction)  . Calcium oxalate crystals in urine  . Right sided weakness  . Normocytic anemia  . Leukopenia  . CKD (chronic kidney disease), stage II  . Chronic back pain  . Preventive measure    Patient's Medications  New Prescriptions   No medications on file  Previous Medications   ASPIRIN 81 MG EC TABLET    Take 4 tablets (325 mg total) by mouth daily.   LAMIVUDINE-ZIDOVUDINE (COMBIVIR) 150-300 MG PER TABLET    Take 1 tablet by mouth 2 (two) times daily.   LISINOPRIL (PRINIVIL) 10 MG TABLET    Take 1 tablet (10 mg total) by mouth daily.   LOPINAVIR-RITONAVIR (KALETRA) 200-50 MG PER TABLET    Take 2 tablets by mouth 2 (two) times daily.   MELOXICAM (MOBIC) 15 MG TABLET    Take 0.5 tablets (7.5 mg total) by mouth daily.   METOPROLOL TARTRATE (LOPRESSOR) 25 MG TABLET    Take 1 tablet (25 mg total) by mouth 2 (two) times daily.   MIRTAZAPINE (REMERON) 15 MG TABLET    Take 1 tablet (15 mg total) by mouth at bedtime.   QUETIAPINE (SEROQUEL) 100 MG TABLET    Take 1 tablet (100 mg total) by mouth at bedtime.   TENOFOVIR (VIREAD) 300 MG TABLET    Take 300 mg by mouth daily.   TRAMADOL (ULTRAM) 50 MG TABLET    Take 1 tablet (50 mg total) by mouth every 6 (six) hours as needed for pain.   ZOLPIDEM (AMBIEN) 10 MG TABLET    Take 10 mg by mouth at bedtime as needed. For sleep  Modified Medications   No medications on file  Discontinued Medications   No medications on file    Subjective: Jacob Joseph is in for his routine  visit. He denies missing any doses of his HIV medications since his last visit. He states that his residual right arm weakness is unchanged following his recent strokes. He continues to smoke one cigarette daily and does not feel like he can quit completely. He is currently not working and feels like he needs a part-time aide at home to assist him now that his wife has returned to work following her recent left knee surgery.  His primary care physician, Dr. Eben Burow, instructed him to stop lisinopril because his creatinine was elevated. However, he restart his lisinopril about one week ago because he stated that his blood pressures were running high at home. He has already received his influenza vaccine.  Objective: Temp: 98 F (36.7 C) (12/03 0939) Temp src: Oral (12/03 0939) BP: 120/81 mmHg (12/03 0939) Pulse Rate: 67  (12/03 0939)  General: He appears thinner than usual and his affect is somewhat flatter than normal Skin: No rash Lungs: Clear Cor: Regular S1-S2 no murmurs Unchanged right arm weakness with muscle wasting  Lab Results HIV 1 RNA Quant (copies/mL)  Date Value  06/07/2012 <20   03/08/2012 <20   11/30/2011 <20  CD4 T Cell Abs (cmm)  Date Value  06/07/2012 450   03/14/2012 490   03/08/2012 540      Assessment: His HIV infection remains under excellent control. I will continue his current regimen.  I have encouraged him to quit smoking cigarettes completely.  His creatinine has been elevated recently to 1.45. I will repeat a creatinine today to determine if he needs to stop the lisinopril again and followup with his primary care physician.  Plan: 1. Continue current antiretroviral regimen 2. Recheck creatinine 3. Followup here in 6 months   Cliffton Asters, MD [facility] for Infectious Disease [facility] [phone] pager   [phone] cell 06/21/2012, 10:05 AM

## 2012-06-22 ENCOUNTER — Telehealth: Payer: Self-pay | Admitting: *Deleted

## 2012-06-22 NOTE — Telephone Encounter (Signed)
Blood results NL, continue medications.  Call [facility] to make appt w/ PCP, Dr. Eben Burow in 6-8 weeks to f/u.   Message left with this information.  Phone numbers given.

## 2012-06-23 ENCOUNTER — Other Ambulatory Visit: Payer: Self-pay | Admitting: Licensed Clinical Social Worker

## 2012-06-23 DIAGNOSIS — B20 Human immunodeficiency virus [HIV] disease: Secondary | ICD-10-CM

## 2012-06-23 MED ORDER — TENOFOVIR DISOPROXIL FUMARATE 300 MG PO TABS: 300.0000 mg | ORAL_TABLET | Freq: Every day | ORAL | Status: DC

## 2012-06-23 MED ORDER — LOPINAVIR-RITONAVIR 200-50 MG PO TABS: 2.0000 | ORAL_TABLET | Freq: Two times a day (BID) | ORAL | Status: DC

## 2012-06-23 MED ORDER — LAMIVUDINE-ZIDOVUDINE 150-300 MG PO TABS: 1.0000 | ORAL_TABLET | Freq: Two times a day (BID) | ORAL | Status: DC

## 2012-06-28 ENCOUNTER — Other Ambulatory Visit: Payer: Self-pay | Admitting: Internal Medicine

## 2012-06-28 ENCOUNTER — Other Ambulatory Visit (INDEPENDENT_AMBULATORY_CARE_PROVIDER_SITE_OTHER): Payer: Medicaid Other

## 2012-06-28 DIAGNOSIS — E875 Hyperkalemia: Secondary | ICD-10-CM

## 2012-06-28 LAB — BASIC METABOLIC PANEL WITH GFR
BUN: 11 mg/dL (ref 6–23)
CO2: 21 mEq/L (ref 19–32)
Calcium: 8.8 mg/dL (ref 8.4–10.5)
GFR, Est African American: 88 mL/min
Glucose, Bld: 116 mg/dL — ABNORMAL HIGH (ref 70–99)
Sodium: 139 mEq/L (ref 135–145)

## 2012-07-22 ENCOUNTER — Other Ambulatory Visit: Payer: Self-pay | Admitting: Licensed Clinical Social Worker

## 2012-07-22 DIAGNOSIS — F329 Major depressive disorder, single episode, unspecified: Secondary | ICD-10-CM

## 2012-07-22 MED ORDER — MIRTAZAPINE 15 MG PO TABS: 15.0000 mg | ORAL_TABLET | Freq: Every day | ORAL | Status: DC

## 2012-07-25 ENCOUNTER — Other Ambulatory Visit: Payer: Self-pay | Admitting: *Deleted

## 2012-07-25 DIAGNOSIS — F329 Major depressive disorder, single episode, unspecified: Secondary | ICD-10-CM

## 2012-07-25 MED ORDER — MIRTAZAPINE 15 MG PO TABS: 15.0000 mg | ORAL_TABLET | Freq: Every day | ORAL | Status: DC

## 2012-08-10 ENCOUNTER — Telehealth: Payer: Self-pay | Admitting: *Deleted

## 2012-08-10 NOTE — Telephone Encounter (Signed)
Message left requesting pt or wife call back to let [facility] know how [facility] is working out.

## 2012-08-18 ENCOUNTER — Encounter: Payer: Self-pay | Admitting: *Deleted

## 2012-08-18 NOTE — Telephone Encounter (Signed)
RN spoke with the pt.  He shared that the [facility] service was helping with bathing, dressing and eating.  RN offered that [employer] has a Monday-Friday program that will deliver lunch.  RN provided the pt with the telephone # [phone]) for [employer] and to speak with Truman Hayward, Director.  The pt wrote down this information and stated that he would call.

## 2012-08-18 NOTE — Telephone Encounter (Signed)
error 

## 2012-09-07 ENCOUNTER — Other Ambulatory Visit: Payer: Medicaid Other

## 2012-09-07 LAB — COMPREHENSIVE METABOLIC PANEL
Albumin: 3.4 g/dL — ABNORMAL LOW (ref 3.5–5.2)
BUN: 10 mg/dL (ref 6–23)
Calcium: 9.1 mg/dL (ref 8.4–10.5)
Chloride: 108 mEq/L (ref 96–112)
Creat: 1.01 mg/dL (ref 0.50–1.35)
Glucose, Bld: 99 mg/dL (ref 70–99)
Potassium: 4 mEq/L (ref 3.5–5.3)

## 2012-09-07 LAB — LIPID PANEL
HDL: 35 mg/dL — ABNORMAL LOW (ref >39)
Total CHOL/HDL Ratio: 2.9 Ratio
Triglycerides: 117 mg/dL (ref <150)

## 2012-09-07 LAB — CBC
HCT: 35 % — ABNORMAL LOW (ref 39.0–52.0)
Hemoglobin: 11.6 g/dL — ABNORMAL LOW (ref 13.0–17.0)
MCHC: 33.1 g/dL (ref 30.0–36.0)
MCV: 98.6 fL (ref 78.0–100.0)
WBC: 4.9 10*3/uL (ref 4.0–10.5)

## 2012-09-08 LAB — HIV-1 RNA QUANT-NO REFLEX-BLD
HIV 1 RNA Quant: <20 copies/mL (ref <20)
HIV-1 RNA Quant, Log: <1.3 {Log} (ref <1.30)

## 2012-09-21 ENCOUNTER — Ambulatory Visit: Payer: Self-pay | Admitting: Internal Medicine

## 2012-10-20 ENCOUNTER — Other Ambulatory Visit: Payer: Self-pay | Admitting: Internal Medicine

## 2012-10-20 DIAGNOSIS — F329 Major depressive disorder, single episode, unspecified: Secondary | ICD-10-CM

## 2012-12-13 ENCOUNTER — Ambulatory Visit: Payer: Medicaid Other | Admitting: *Deleted

## 2012-12-13 DIAGNOSIS — F1410 Cocaine abuse, uncomplicated: Secondary | ICD-10-CM

## 2012-12-13 NOTE — Progress Notes (Signed)
Patient ID: Jacob Joseph, male   DOB: 05-30-1956, 57 y.o.   MRN: [id number] Chaka presented to today's session lucid, oriented x 4, mostly flat affect. He shared that he has a cocaine problem and wants to work on staying drug free. Patient reported that he has been open with Dr. Orvan Falconer about this problem and thought that seeking SA counseling could be helpful to him in addressing his addiction problem. He sees Teaching laboratory technician for Ambien, Seroquel, and Remeron after unsuccessful attempts at Paxil and Effexor. Patient provided Clinical research associate an overview of his substance problem and we examined his past effort to get sober through [employer] (lasted about 9 months). Treyvon shared losing a child to homicide several years ago and then using cocaine around 2010 with intermittent binges since that time. Spouse uses as well although she is currently seeking tx support through [facility]. Patient had a stroke in April 2013 but states he has recovered from that moderately well.   Counselor outlined some options for Karel in regard to seeking structured treatment, resuming NA, and highlighting some areas of focus that would be useful to Milfay in learning to cope with thoughts of using. We agreed to another 1:1 appt next Tuesday June 3 at 2:00. He chose not to pursue the IOP recommendation while agreeing to keep that on the back burner as an option. He agreed to resume NA meetings participation as he had a favorable experience with NA. Will attend the [place] meeting at 8:00 pm. We will focus on the topic of recovery-building and coping with drug use triggers in next few sessions. Counselor validated Aurelius's follow-through in making today's appt and processed with pt several noted strengths which can aid his recovery effort despite obvious challenges. Patient was receptive to counselor interventions. Interventions deemed effective.

## 2012-12-20 ENCOUNTER — Ambulatory Visit: Payer: Medicaid Other | Admitting: *Deleted

## 2012-12-20 ENCOUNTER — Other Ambulatory Visit: Payer: Medicaid Other

## 2012-12-20 ENCOUNTER — Other Ambulatory Visit: Payer: Self-pay | Admitting: Internal Medicine

## 2012-12-20 DIAGNOSIS — B20 Human immunodeficiency virus [HIV] disease: Secondary | ICD-10-CM

## 2012-12-20 LAB — COMPREHENSIVE METABOLIC PANEL
Albumin: 3.5 g/dL (ref 3.5–5.2)
CO2: 21 mEq/L (ref 19–32)
Calcium: 9 mg/dL (ref 8.4–10.5)
Chloride: 106 mEq/L (ref 96–112)
Glucose, Bld: 110 mg/dL — ABNORMAL HIGH (ref 70–99)
Sodium: 138 mEq/L (ref 135–145)
Total Bilirubin: 0.7 mg/dL (ref 0.3–1.2)
Total Protein: 7.3 g/dL (ref 6.0–8.3)

## 2012-12-20 LAB — CBC WITH DIFFERENTIAL/PLATELET
Basophils Absolute: 0 10*3/uL (ref 0.0–0.1)
Eosinophils Relative: 7 % — ABNORMAL HIGH (ref 0–5)
HCT: 34 % — ABNORMAL LOW (ref 39.0–52.0)
Lymphocytes Relative: 51 % — ABNORMAL HIGH (ref 12–46)
MCHC: 33.2 g/dL (ref 30.0–36.0)
MCV: 97.7 fL (ref 78.0–100.0)
Monocytes Absolute: 0.2 10*3/uL (ref 0.1–1.0)
Monocytes Relative: 7 % (ref 3–12)
RDW: 16.1 % — ABNORMAL HIGH (ref 11.5–15.5)

## 2012-12-20 NOTE — Progress Notes (Signed)
Patient ID: Reuven Braver, male   DOB: 1956-02-14, 56 y.o.   MRN: [id number] Angelgabriel initially was going to have to cancel but then made it early for his appt today. He was visibly more energetic today than in last week's session and expressed that he feels more upbeat and optimistic. Affect was somewhat brighter and he shared that he and his wife are more on the same page in regard to both of them wanting to be drug free. Kyle stated she is now on probation and he feels that will prompt her to be more cautious and conscientious about her own sobriety and that this will better enable him to remain sober as well. Counselor and client evaluated the need to both distance self from active users while making simultaneous effort to gain more recovery support. Nazario agreed but stated he had not follow-through on attendance to NA like he had intended, but will try to make a meeting in the coming week. Counselor reinforced pt momentum and used motivational interviewing to help patient examine continued need for personal commitment to change process. Joquan was receptive and well-engaged. Intervention deemed successful. Made appt for next week.

## 2012-12-21 LAB — T-HELPER CELL (CD4) - (RCID CLINIC ONLY)
CD4 % Helper T Cell: 24 % — ABNORMAL LOW (ref 33–55)
CD4 T Cell Abs: 420 uL (ref 400–2700)

## 2012-12-22 NOTE — Progress Notes (Signed)
Patient ID: Jacob Joseph, male   DOB: 1955/09/30, 57 y.o.   MRN: [id number] [facility] CM: [name]

## 2012-12-28 ENCOUNTER — Ambulatory Visit: Payer: Medicaid Other | Admitting: *Deleted

## 2012-12-28 ENCOUNTER — Telehealth: Payer: Self-pay | Admitting: *Deleted

## 2012-12-28 DIAGNOSIS — F1410 Cocaine abuse, uncomplicated: Secondary | ICD-10-CM

## 2012-12-28 NOTE — Telephone Encounter (Signed)
Left message requesting pt discuss appetite concerns with MD at next OV 12/29/12.

## 2012-12-28 NOTE — Progress Notes (Signed)
Patient ID: Jacob Joseph, male   DOB: 1956/04/10, 57 y.o.   MRN: [id number] Jacob Joseph presented mildly upbeat, good eye contact, well focused. We addressed his return to NA meetings and reconnection with NA sponsor. Jacob Joseph has attended meetings daily at 7:30 am and reports feeling positive about his renewed sobriety effort & reconnection with supportive others in recovery. Patient is presently drug free and has experienced increased outings with his wife which have combated boredom and offered more structure to his day. Counselor and patient processed how his conscientious efforts to re-engage in NA and the pursuit of increased activity lessen the risk of drug relapse. Counselor inquired as to patient ability to cope with other users. Jacob Joseph stated he has purposefully avoided contact with family users and expressed to a relative that he and his wife are aiming to stay drug free. This was well received. Counselor credited patient assertiveness in this area and reviewed other methods for furthering his present sobriety through guided step work with sponsor and daily exercise. Jacob Joseph is examining the possibility of joining the [employer] downtown.   Intervention was successful. Jacob Joseph will return on July 3 for next appt with Clinical research associate. Pt health reported moderately good and with no present health concerns.   Glendell Schlottman, [facility]

## 2013-01-03 ENCOUNTER — Encounter: Payer: Self-pay | Admitting: Internal Medicine

## 2013-01-03 ENCOUNTER — Ambulatory Visit (INDEPENDENT_AMBULATORY_CARE_PROVIDER_SITE_OTHER): Payer: Medicaid Other | Admitting: Internal Medicine

## 2013-01-03 VITALS — BP 135/83 | HR 78 | Temp 98.4°F | Ht 72.0 in | Wt 169.5 lb

## 2013-01-03 DIAGNOSIS — B20 Human immunodeficiency virus [HIV] disease: Secondary | ICD-10-CM

## 2013-01-03 DIAGNOSIS — R634 Abnormal weight loss: Secondary | ICD-10-CM | POA: Insufficient documentation

## 2013-01-03 MED ORDER — MEGESTROL ACETATE 40 MG/ML PO SUSP: 800.0000 mg | Freq: Every day | ORAL | Status: DC

## 2013-01-03 MED ORDER — DARUNAVIR ETHANOLATE 800 MG PO TABS: 800.0000 mg | ORAL_TABLET | Freq: Every day | ORAL | Status: DC

## 2013-01-03 MED ORDER — ELVITEG-COBIC-EMTRICIT-TENOFDF 150-150-200-300 MG PO TABS: 1.0000 | ORAL_TABLET | Freq: Every day | ORAL | Status: DC

## 2013-01-03 NOTE — Progress Notes (Signed)
Patient ID: Jacob Joseph, male   DOB: 1956/07/13, 57 y.o.   MRN: [id number]          [facility] for Infectious Disease  Patient Active Problem List   Diagnosis Date Noted  . CVA (cerebral infarction) 12/03/2011    Priority: High  . HIV INFECTION 04/27/2006    Priority: High  . Polysubstance abuse 04/27/2006    Priority: High  . Preventive measure 05/30/2012  . Chronic back pain 03/09/2012  . Right sided weakness 03/08/2012  . Normocytic anemia 03/08/2012  . Leukopenia 03/08/2012  . CKD (chronic kidney disease), stage II 03/08/2012  . Calcium oxalate crystals in urine 12/13/2011  . DIABETES MELLITUS, TYPE II 08/13/2009  . ERECTILE DYSFUNCTION 04/25/2008  . LEG PAIN, LEFT 01/24/2008  . INSOMNIA, CHRONIC 06/14/2007  . TUBERCULOSIS 04/27/2006  . HEPATITIS C 04/27/2006  . TOBACCO USER 04/27/2006  . DEPRESSION 04/27/2006  . ESSENTIAL HYPERTENSION 04/27/2006  . DEGENERATIVE JOINT DISEASE 04/27/2006  . DEGENERATIVE DISC DISEASE, LUMBAR SPINE 04/27/2006    Patient's Medications  New Prescriptions   No medications on file  Previous Medications   ASPIRIN 81 MG EC TABLET    Take 4 tablets (325 mg total) by mouth daily.   LAMIVUDINE-ZIDOVUDINE (COMBIVIR) 150-300 MG PER TABLET    Take 1 tablet by mouth 2 (two) times daily.   LISINOPRIL (PRINIVIL) 10 MG TABLET    Take 1 tablet (10 mg total) by mouth daily.   LOPINAVIR-RITONAVIR (KALETRA) 200-50 MG PER TABLET    Take 2 tablets by mouth 2 (two) times daily.   MELOXICAM (MOBIC) 15 MG TABLET    Take 0.5 tablets (7.5 mg total) by mouth daily.   METOPROLOL TARTRATE (LOPRESSOR) 25 MG TABLET    Take 1 tablet (25 mg total) by mouth 2 (two) times daily.   MIRTAZAPINE (REMERON) 15 MG TABLET    TAKE ONE TABLET BY MOUTH DAILY AT BEDTIME   QUETIAPINE (SEROQUEL) 100 MG TABLET    Take 1 tablet (100 mg total) by mouth at bedtime.   TENOFOVIR (VIREAD) 300 MG TABLET    Take 1 tablet (300 mg total) by mouth daily.   TRAMADOL (ULTRAM) 50 MG TABLET     Take 1 tablet (50 mg total) by mouth every 6 (six) hours as needed for pain.   ZOLPIDEM (AMBIEN) 10 MG TABLET    Take 10 mg by mouth at bedtime as needed. For sleep  Modified Medications   No medications on file  Discontinued Medications   No medications on file    Subjective: Walid is in for his routine followup visit. He denies missing any of his HIV medications recently. He is proud of his regained sobriety. He has been meeting with our substance abuse counselor and finds that quite helpful. He is attending daily NA meetings and has reconnected with his sponsor.  He has gone back to smoking cigarettes and said he had no current plan to quit although has been thinking about this. His wife Harriet Butte still smokes cigarettes. They did find it helpful last year when they called the [place] quit line.  He has been losing weight. She says that he just picks at his meals. He says that his appetite is not as good as it had been.  Review of Systems: Constitutional: positive for anorexia and weight loss, negative for chills, fevers and sweats Eyes: negative Ears, nose, mouth, throat, and face: negative Respiratory: negative Cardiovascular: negative Gastrointestinal: negative for abdominal pain, change in bowel habits, constipation, diarrhea,  nausea, reflux symptoms and vomiting Genitourinary:negative Neurological: positive for weakness and as he has lost weight he has noted some difficulties with his balance and has taken to walking with a cane, negative for dizziness, gait problems, memory problems and speech problems Behavioral/Psych: negative for anxiety and depression  Past Medical History  Diagnosis Date  . Hypertension   . HIV (human immunodeficiency virus infection)   . Depression   . Stroke     History  Substance Use Topics  . Smoking status: Former Smoker -- 0.10 packs/day for 41 years    Types: Cigarettes    Quit date: 04/18/2012  . Smokeless tobacco: Never Used      Comment: quit smioking when he went in the hosp for CVA but restarted at 1/3rd pack per day  . Alcohol Use: 0.0 oz/week    0 drink(s) per week    Family History  Problem Relation Age of Onset  . Hypertension Mother   . Hypertension Father   . Cancer Father     Allergies  Allergen Reactions  . Statins Other (See Comments)    Elevated liver enzymes.    Objective:    General: He is lost 20 pounds in the past 6 months. He is in no distress Oral: No oropharyngeal lesions Skin: No rash Lungs: Clear Cor: Regular S1 and S2 no murmurs Abdomen: Soft and nontender without palpable masses Joints and extremities: No acute abnormalities Neuro: Some residual right-sided weakness but this seems to have improved over the last 6 months. He has a normal Romberg. His gait is slow but steady with the aid of his cane Mood and affect normal  Lab Results HIV 1 RNA Quant (copies/mL)  Date Value  12/20/2012 <20   09/07/2012 <20   06/07/2012 <20      CD4 T Cell Abs (cmm)  Date Value  12/20/2012 420   09/07/2012 470   06/07/2012 450      Lab Results  Component Value Date   WBC 3.4* 12/20/2012   HGB 11.3* 12/20/2012   HCT 34.0* 12/20/2012   MCV 97.7 12/20/2012   PLT 181 12/20/2012   BMET    Component Value Date/Time   NA 138 12/20/2012 1211   K 3.4* 12/20/2012 1211   CL 106 12/20/2012 1211   CO2 21 12/20/2012 1211   GLUCOSE 110* 12/20/2012 1211   BUN 11 12/20/2012 1211   CREATININE 1.02 12/20/2012 1211   CREATININE 1.15 03/11/2012 0705   CALCIUM 9.0 12/20/2012 1211   GFRNONAA 70* 03/11/2012 0705   GFRAA 82* 03/11/2012 0705   Lab Results  Component Value Date   ALT 53 12/20/2012   AST 100* 12/20/2012   ALKPHOS 86 12/20/2012   BILITOT 0.7 12/20/2012   Lab Results  Component Value Date   CHOL 103 09/07/2012   HDL 35* 09/07/2012   LDLCALC 45 09/07/2012   TRIG 117 09/07/2012   CHOLHDL 2.9 09/07/2012   Assessment: His HIV infection is under very good control but we discussed options for simplifying and improving  his complicated and rather old regimen. His lipid panel is under fairly good control now, probably because of his recent weight loss, but I would like to get him off the Kaletra because of its impact on lipids and his recent stroke. I will change him to a simplify regimen of Stribild and Prezista.  Have encouraged him to call the [place] quit line again and try to reinvigorate his plan to quit smoking cigarettes.  I am not  sure why he is losing weight unintentionally. I do not think that he is still using cocaine or other illicit drugs. He also does not appear to be depressed. I will give him Megace and see him back soon to monitor his weight.  Plan: 1. Change antiretroviral regimen to Stribild and Prezista 2. Cigarette cessation counseling provided 3. Start Megace 4. Followup within 3 months to recheck weight   Cliffton Asters, MD [facility] for Infectious Disease [facility] [phone] pager   [phone] cell 01/03/2013, 12:04 PM

## 2013-01-03 NOTE — Progress Notes (Signed)
HPI: Jacob Joseph is a 57 y.o. male with HIV here for follow up with his wife. He has been well controlled but has a history of dyslipidemia and is interested in trying an updated ART regimen.  Allergies: Allergies  Allergen Reactions  . Statins Other (See Comments)    Elevated liver enzymes.    Vitals: Temp: 98.4 F (36.9 C) (06/17 1434) Temp src: Oral (06/17 1434) BP: 135/83 mmHg (06/17 1434) Pulse Rate: 78 (06/17 1434)  Past Medical History: Past Medical History  Diagnosis Date  . Hypertension   . HIV (human immunodeficiency virus infection)   . Depression   . Stroke     Social History: History   Social History  . Marital Status: Married    Spouse Name: N/A    Number of Children: N/A  . Years of Education: N/A   Social History Main Topics  . Smoking status: Former Smoker -- 0.30 packs/day for 41 years    Types: Cigarettes    Quit date: 04/18/2012  . Smokeless tobacco: Never Used     Comment: quit smioking when he went in the hosp for CVA but restarted at 1/3rd pack per day  . Alcohol Use: No  . Drug Use: No     Comment: every 2-3 days prior to stroke in May 2013, stopped using July 2013  . Sexually Active: Yes -- Male partner(s)     Comment: declined condoms   Other Topics Concern  . None   Social History Narrative   Pt lives with wife and grandkid in [place].   Has applied for disability and is pending.   Worked in [place] before about 3 years ago.          Current Regimen: Kaletra, Combivir, tenofovir  Labs: HIV 1 RNA Quant (copies/mL)  Date Value  12/20/2012 <20   09/07/2012 <20   06/07/2012 <20      CD4 T Cell Abs (cmm)  Date Value  12/20/2012 420   09/07/2012 470   06/07/2012 450      Hep B S Ab (no units)  Date Value  03/09/2012 NEGATIVE      Hepatitis B Surface Ag (no units)  Date Value  03/09/2012 NEGATIVE      HCV Ab (no units)  Date Value  09/13/2006 YES     CrCl: Estimated Creatinine Clearance: 89 ml/min (by C-G  formula based on Cr of 1.02).  Lipids:    Component Value Date/Time   CHOL 103 09/07/2012 1416   TRIG 117 09/07/2012 1416   HDL 35* 09/07/2012 1416   CHOLHDL 2.9 09/07/2012 1416   VLDL 23 09/07/2012 1416   LDLCALC 45 09/07/2012 1416    Assessment: Spoke with patient about changing ART regimens and he was amenable to trying something new. Dr. Orvan Falconer and I reviewed his genotype and came up with the combination of Stribild with darunavir.He is integrase naive which coupled with his genotype should make the DRV, TDF, and EVG fully active. Patient has had good adherence in the past and he seemed pleased with the change to a once daily regimen. He understood the importance of adherence and should do well on his new regimen.  Recommendations: Change ART to Stribild + Darunavir once daily  Drue Stager, PharmD [facility] for Infectious Disease 01/03/2013, 3:22 PM

## 2013-01-11 ENCOUNTER — Ambulatory Visit (HOSPITAL_COMMUNITY)
Admission: RE | Admit: 2013-01-11 | Discharge: 2013-01-11 | Disposition: A | Discharge status: Home / Self Care | Payer: Medicaid Other | Source: Ambulatory Visit | Attending: Internal Medicine | Admitting: Internal Medicine

## 2013-01-11 ENCOUNTER — Encounter: Payer: Self-pay | Admitting: Internal Medicine

## 2013-01-11 ENCOUNTER — Ambulatory Visit (INDEPENDENT_AMBULATORY_CARE_PROVIDER_SITE_OTHER): Payer: Medicaid Other | Admitting: Internal Medicine

## 2013-01-11 VITALS — BP 141/86 | HR 70 | Temp 97.0°F | Ht 72.0 in | Wt 168.7 lb

## 2013-01-11 DIAGNOSIS — M51379 Other intervertebral disc degeneration, lumbosacral region without mention of lumbar back pain or lower extremity pain: Secondary | ICD-10-CM | POA: Insufficient documentation

## 2013-01-11 DIAGNOSIS — E119 Type 2 diabetes mellitus without complications: Secondary | ICD-10-CM

## 2013-01-11 DIAGNOSIS — M5137 Other intervertebral disc degeneration, lumbosacral region: Secondary | ICD-10-CM

## 2013-01-11 MED ORDER — MELOXICAM 15 MG PO TABS: 15.0000 mg | ORAL_TABLET | Freq: Every day | ORAL | Status: DC

## 2013-01-11 NOTE — Progress Notes (Signed)
Subjective:   Patient ID: Jacob Joseph male   DOB: 1955-12-21 57 y.o.   MRN: [id number] Chief compliant: back pain  HPI: [name] is a 57 y.o. man with PMH of chronic back pain, hepatitis C, HIV ( last CD4 420,VL < 20 on 12/20/12), DM,  HTN, CVA, depression and remote hx of polysubstance abuse, who presents to the clinic for back pain.   Patient reports that he started to have lower back pain 2 weeks ago. He describes that his back pain is located at lower back area, sharp/aching pain, 5-7/10, radiating to his left leg down to his knee. Walking and standing make the pain worse, and resting makes it better. Denies weakness, numbness or tingling. Denies bowel or urinary incontinence. Denies other associated symptoms. He did not take any medications for his pain.  He is here for evaluation.   Of note, he has had similar chronic back pain for years. It always radiates to his left leg. He was given NSAIDs and heat therapy in the past.   Past Medical History  Diagnosis Date  . Hypertension   . HIV (human immunodeficiency virus infection)   . Depression   . Stroke    Current Outpatient Prescriptions  Medication Sig Dispense Refill  . aspirin 81 MG EC tablet Take 4 tablets (325 mg total) by mouth daily.  30 tablet  11  . Darunavir Ethanolate (PREZISTA) 800 MG tablet Take 1 tablet (800 mg total) by mouth daily with breakfast.  30 tablet  11  . elvitegravir-cobicistat-emtricitabine-tenofovir (STRIBILD) 150-150-200-300 MG TABS Take 1 tablet by mouth daily with breakfast.  30 tablet  11  . lisinopril (PRINIVIL) 10 MG tablet Take 1 tablet (10 mg total) by mouth daily.  30 tablet  5  . megestrol (MEGACE ORAL) 40 MG/ML suspension Take 20 mLs (800 mg total) by mouth daily.  240 mL  5  . metoprolol tartrate (LOPRESSOR) 25 MG tablet Take 1 tablet (25 mg total) by mouth 2 (two) times daily.  60 tablet  11  . mirtazapine (REMERON) 15 MG tablet TAKE ONE TABLET BY MOUTH DAILY AT BEDTIME  30 tablet  5   . QUEtiapine (SEROQUEL) 100 MG tablet Take 1 tablet (100 mg total) by mouth at bedtime.  30 tablet  1  . traMADol (ULTRAM) 50 MG tablet Take 1 tablet (50 mg total) by mouth every 6 (six) hours as needed for pain.  30 tablet  0  . zolpidem (AMBIEN) 10 MG tablet Take 10 mg by mouth at bedtime as needed. For sleep       No current facility-administered medications for this visit.   Family History  Problem Relation Age of Onset  . Hypertension Mother   . Hypertension Father   . Cancer Father    History   Social History  . Marital Status: Married    Spouse Name: N/A    Number of Children: N/A  . Years of Education: N/A   Social History Main Topics  . Smoking status: Current Every Day Smoker -- 0.50 packs/day for 41 years    Types: Cigarettes    Last Attempt to Quit: 04/18/2012  . Smokeless tobacco: Never Used     Comment: quit smioking when he went in the hosp for CVA but restarted at 1/3rd pack per day  . Alcohol Use: No  . Drug Use: No     Comment: every 2-3 days prior to stroke in May 2013, stopped using July 2013  . Sexually Active: Yes --  Male partner(s)     Comment: declined condoms   Other Topics Concern  . None   Social History Narrative   Pt lives with wife and grandkid in [place].   Has applied for disability and is pending.   Worked in [place] before about 3 years ago.         Review of Systems: Review of Systems:  Constitutional:  Denies fever, chills, diaphoresis, appetite change and fatigue.   HEENT:  Denies congestion, sore throat, rhinorrhea, sneezing, mouth sores, trouble swallowing, neck pain   Respiratory:  Denies SOB, DOE, cough, and wheezing.   Cardiovascular:  Denies palpitations and leg swelling.   Gastrointestinal:  Denies nausea, vomiting, abdominal pain, diarrhea, constipation, blood in stool and abdominal distention.   Genitourinary:  Denies dysuria, urgency, frequency, hematuria, flank pain and difficulty urinating.   Musculoskeletal:   Denies myalgias,joint swelling, arthralgias and gait problem. Positive for lower back pain radiating to left  Leg.   Skin:  Denies pallor, rash and wound.   Neurological:  Denies dizziness, seizures, syncope, weakness, light-headedness, numbness and headaches.    .    Objective:  Physical Exam: Filed Vitals:   01/11/13 0822  BP: 141/86  Pulse: 70  Temp: 97 F (36.1 C)  TempSrc: Oral  Height: 6' (1.829 m)  Weight: 168 lb 11.2 oz (76.522 kg)  SpO2: 99%   General: alert, well-developed, and cooperative to examination.  Head: normocephalic and atraumatic.  Eyes: vision grossly intact, pupils equal, pupils round, pupils reactive to light, no injection and anicteric.  Mouth: pharynx pink and moist, no erythema, and no exudates.  Neck: supple, full ROM, no thyromegaly, no JVD, and no carotid bruits.  Lungs: normal respiratory effort, no accessory muscle use, normal breath sounds, no crackles, and no wheezes. Heart: normal rate, regular rhythm, no murmur, no gallop, and no rub.  Abdomen: soft, non-tender, normal bowel sounds, no distention, no guarding, no rebound tenderness, no hepatomegaly, and no splenomegaly.  Msk: no joint swelling, no joint warmth, and no redness over lumbar and knee joints. Paraspinal tenderness noted on lower lumbar and sacral area. SLRT positive.  Pulses: 2+ DP/PT pulses bilaterally Extremities: No cyanosis, clubbing, edema Neurologic: alert & oriented X3, cranial nerves II-XII intact, strength normal in all extremities, sensation intact to light touch, and gait normal.  Skin: turgor normal and no rashes.  Psych: Oriented X3, memory intact for recent and remote, normally interactive, good eye contact, not anxious appearing, and not depressed appearing.      Assessment & Plan:

## 2013-01-11 NOTE — Patient Instructions (Addendum)
1. Will give your pain medication Mobic 15 mg po daily x 10 days 2. Use heat pack to your back 2-3 times daily 3. Follow up with the clinic in 2 weeks if your pain is not better. 3. Please call the clinic if you develop weakness, numbness tingling bowel or urinary incontinence.

## 2013-01-11 NOTE — Assessment & Plan Note (Addendum)
Patient has had chronic intermettent lower back pain for years. He used to be on narcotics per Dr. Sumner Boast documentation in 2011, and his narcotics was D/C'd due to cocaine abuse.  He presents with 2-week lower back pain which is similar to his pain in the past. Denies precipitating factors. Denies trauma or injury. Physical exam reveals paraspinal and sacral area tenderness to palpation. SLRT is positive. No neurological deficit noted on examination.   Lumbar X ray in 2009 showed " Mild spondylosis without acute finding".   - The clinic manifestation is consistent with lumbar and sacral radiculopathy.  - will need to repeat lumbar/sacral Xray to rule out any bony abnormalities - will prescribe NSAIDs  Mobic 15 QD x 10 days - heat therapy 2-3 times daily.  - if X ray indicated or his symptoms persist after above therapy, will send sports medicine referral. - patient is instructed to call the clinic if his pain persists or he develops numbness, tingling, weakness, bowel or urinary incontinence.

## 2013-01-18 NOTE — Progress Notes (Signed)
Case discussed with Dr. Li at the time of the visit.  We reviewed the resident's history and exam and pertinent patient test results.  I agree with the assessment, diagnosis, and plan of care documented in the resident's note. 

## 2013-01-26 ENCOUNTER — Ambulatory Visit: Payer: Medicaid Other | Admitting: *Deleted

## 2013-01-26 DIAGNOSIS — F1410 Cocaine abuse, uncomplicated: Secondary | ICD-10-CM

## 2013-01-26 NOTE — Progress Notes (Signed)
Patient ID: Jacob Joseph, male   DOB: 1955/11/20, 57 y.o.   MRN: [id number] Jacob Joseph presented oriented x 4, alert, attentive. Counselor and patient processed his overall addiction recovery efforts of the last 3 weeks and Jacob Joseph self-assessed his own recovery progress. He shared that he has not felt this good in a long time. Patient was more upbeat, smiled during the session, was talkative and on topic, and appeared to have gained weight since last meeting. Jacob Joseph highlighted increased 12 step meetings attendance and visits to [employer] as instrumental in his growing motivation for long term sobriety.   Counselor and patient reviewed relapse prevention checklist and we then examined the continued benefits he is deriving from a strong commitment to avoiding high risk "people, places, and things". Patient and his recovering spouse have disconnected for now from a son who is still using drugs. Jacob Joseph identifies this decision as necessary to their mutual safety since associations with son in the past led to dramatic relapses. Counselor and patient looked at the positive correlation between his sobriety and favorable viral load numbers. Jacob Joseph expressed that he has no doubt that his weight gain and improving health are the result of his present sobriety work and strict adherence to [facility] medication regimen. Counselor supported patient's ownership of responsibility for active recovery and his valuing of consistency with daily medications. Patient is demonstrating notable progress with his recovery goals. We will reconvene in early August via 1:1 appt. Jacob Joseph can call or schedule before then he is so chooses. Today's interventions were effective.   Jacob Joseph, [facility]

## 2013-02-01 ENCOUNTER — Encounter: Payer: Self-pay | Admitting: Internal Medicine

## 2013-02-01 ENCOUNTER — Ambulatory Visit (INDEPENDENT_AMBULATORY_CARE_PROVIDER_SITE_OTHER): Payer: Medicaid Other | Admitting: Internal Medicine

## 2013-02-01 VITALS — BP 135/83 | HR 82 | Temp 97.8°F | Ht 72.0 in | Wt 171.3 lb

## 2013-02-01 DIAGNOSIS — Z299 Encounter for prophylactic measures, unspecified: Secondary | ICD-10-CM

## 2013-02-01 DIAGNOSIS — F1910 Other psychoactive substance abuse, uncomplicated: Secondary | ICD-10-CM

## 2013-02-01 DIAGNOSIS — N182 Chronic kidney disease, stage 2 (mild): Secondary | ICD-10-CM

## 2013-02-01 DIAGNOSIS — M51379 Other intervertebral disc degeneration, lumbosacral region without mention of lumbar back pain or lower extremity pain: Secondary | ICD-10-CM

## 2013-02-01 DIAGNOSIS — I10 Essential (primary) hypertension: Secondary | ICD-10-CM

## 2013-02-01 DIAGNOSIS — Z0000 Encounter for general adult medical examination without abnormal findings: Secondary | ICD-10-CM

## 2013-02-01 DIAGNOSIS — I129 Hypertensive chronic kidney disease with stage 1 through stage 4 chronic kidney disease, or unspecified chronic kidney disease: Secondary | ICD-10-CM

## 2013-02-01 DIAGNOSIS — M5137 Other intervertebral disc degeneration, lumbosacral region: Secondary | ICD-10-CM

## 2013-02-01 DIAGNOSIS — F17200 Nicotine dependence, unspecified, uncomplicated: Secondary | ICD-10-CM

## 2013-02-01 DIAGNOSIS — D649 Anemia, unspecified: Secondary | ICD-10-CM

## 2013-02-01 LAB — IRON AND TIBC
Iron: 127 ug/dL (ref 42–165)
TIBC: 321 ug/dL (ref 215–435)
UIBC: 194 ug/dL (ref 125–400)

## 2013-02-01 MED ORDER — BLOOD PRESSURE KIT: 1.0000 | PACK | Freq: Two times a day (BID) | Status: DC

## 2013-02-01 MED ORDER — TRAMADOL-ACETAMINOPHEN 37.5-325 MG PO TABS: 1.0000 | ORAL_TABLET | Freq: Four times a day (QID) | ORAL | Status: DC | PRN

## 2013-02-01 NOTE — Assessment & Plan Note (Signed)
Counseled him to avoid over-the-counter medications like, Goody powders, Aleve, Naproxen and Motrin since these can further damage his kidneys.

## 2013-02-01 NOTE — Progress Notes (Signed)
Patient ID: Jacob Joseph, male   DOB: [birth year], 57 y.o.   MRN: [id number]  HPI: [name] is a 57 y.o. with past medical history of type is Hep C., HIV, hypertension, polysubstance abuse, depression, and chronic low back pain, presents to the clinic to followup on his chronic back pain. He also wants to know the results from his LS Xray.  Patient was seen in the clinic on 01/11/2013 and was prescribed Mobic 15 mg once daily. Patient reports that the medication did not control his pain at all. He continues to complain of low back pain of 6/10 and radiating into his left leg. No symptoms of urine or fecal incontinence. He has mild weakness on the right side, secondary to previous stroke, and uses a cane to get around. He is able to do his ADLs and denies falls. In terms of his polysubstance abuse, he reports that he used cocaine and Marijuana 40 days ago, heroin 20 years ago, alcohol, 40 days ago and he continues to smoke even though he wishes to quit.   He reports to be attending a narcotic anomymous group and is trying to quit drugs.  No other complaints.     Past Medical History  Diagnosis Date  . Hypertension   . HIV (human immunodeficiency virus infection)   . Depression   . Stroke 11/2011    Carotids Doppler negative. Right sided weakness resolved, initially on Plavix for 3 months and then continued with ASA.    . TB lung, latent 1988    Treated  . Calcium oxalate crystals in urine 12/23/2011    Asymptomatic, no hematuria. Advised to take plenty of water.   Current Outpatient Prescriptions  Medication Sig Dispense Refill  . aspirin 81 MG EC tablet Take 4 tablets (325 mg total) by mouth daily.  30 tablet  11  . Darunavir Ethanolate (PREZISTA) 800 MG tablet Take 1 tablet (800 mg total) by mouth daily with breakfast.  30 tablet  11  . elvitegravir-cobicistat-emtricitabine-tenofovir (STRIBILD) 150-150-200-300 MG TABS Take 1 tablet by mouth daily with breakfast.  30 tablet  11  .  lisinopril (PRINIVIL) 10 MG tablet Take 1 tablet (10 mg total) by mouth daily.  30 tablet  5  . megestrol (MEGACE ORAL) 40 MG/ML suspension Take 20 mLs (800 mg total) by mouth daily.  240 mL  5  . metoprolol tartrate (LOPRESSOR) 25 MG tablet Take 1 tablet (25 mg total) by mouth 2 (two) times daily.  60 tablet  11  . mirtazapine (REMERON) 15 MG tablet TAKE ONE TABLET BY MOUTH DAILY AT BEDTIME  30 tablet  5  . QUEtiapine (SEROQUEL) 100 MG tablet Take 1 tablet (100 mg total) by mouth at bedtime.  30 tablet  1  . zolpidem (AMBIEN) 10 MG tablet Take 10 mg by mouth at bedtime as needed. For sleep      . traMADol-acetaminophen (ULTRACET) 37.5-325 MG per tablet Take 1 tablet by mouth every 6 (six) hours as needed for pain.  120 tablet  0   No current facility-administered medications for this visit.   Family History  Problem Relation Age of Onset  . Hypertension Mother   . Hypertension Father   . Cancer Father    History   Social History  . Marital Status: Married    Spouse Name: N/A    Number of Children: N/A  . Years of Education: N/A   Social History Main Topics  . Smoking status: Current Every Day Smoker -- 0.50  packs/day for 41 years    Types: Cigarettes    Last Attempt to Quit: 04/18/2012  . Smokeless tobacco: Never Used     Comment: quit smioking when he went in the hosp for CVA but restarted at 1/3rd pack per day  . Alcohol Use: No  . Drug Use: No     Comment: every 2-3 days prior to stroke in May 2013, stopped using July 2013  . Sexually Active: Yes -- Male partner(s)     Comment: declined condoms   Other Topics Concern  . None   Social History Narrative   Pt lives with wife and grandkid in [place].   Has applied for disability and is pending.   Worked in [place] before about 3 years ago.          Review of Systems: Constitutional: Denies fever, chills, diaphoresis, appetite change and fatigue.  Respiratory: Denies SOB, DOE, cough, chest tightness, and  wheezing.  Cardiovascular: No chest pain, palpitations and leg swelling.  Gastrointestinal: No abdominal pain, nausea, vomiting, bloody stools Genitourinary: No dysuria, frequency, hematuria, or flank pain.  Musculoskeletal: No myalgias, back pain, joint swelling, arthralgias    Objective:  Physical Exam: Filed Vitals:   02/01/13 1458  BP: 135/83  Pulse: 82  Temp: 97.8 F (36.6 C)  TempSrc: Oral  Height: 6' (1.829 m)  Weight: 171 lb 4.8 oz (77.701 kg)  SpO2: 98%   General: Well nourished. No acute distress. Use a cane Lungs: CTA bilaterally. Heart: RRR; no extra sounds or murmurs  Abdomen: Non-distended, normal BS, soft, nontender; no hepatosplenomegaly  Back: No tenderness or visible lesion seen.  Extremities: No pedal edema. No joint swelling or tenderness. Neurologic: Alert and oriented x3. No obvious neurologic deficits.  Assessment & Plan:  I have discussed my assessment and plan  with Dr. Criselda Peaches  as detailed under problem based charting.

## 2013-02-01 NOTE — Assessment & Plan Note (Addendum)
BP Readings from Last 3 Encounters:  02/01/13 135/83  01/11/13 141/86  01/03/13 135/83    Lab Results  Component Value Date   NA 138 12/20/2012   K 3.4* 12/20/2012   CREATININE 1.02 12/20/2012    Assessment: Blood pressure control: controlled Progress toward BP goal:  at goal Comments:   Plan: Medications:  continue current medications Educational resources provided:   Self management tools provided: home blood pressure logbook Other plans: I will send BP cuff to his pharmacy

## 2013-02-01 NOTE — Assessment & Plan Note (Signed)
  Assessment: Progress toward smoking cessation:  smoking the same amount Barriers to progress toward smoking cessation:    Comments: Wishes to quit  Plan: Instruction/counseling given:  I counseled patient on the dangers of tobacco use, advised patient to stop smoking, and reviewed strategies to maximize success. Educational resources provided:  [employer]) brochure Self management tools provided:    Medications to assist with smoking cessation:  None Patient agreed to the following self-care plans for smoking cessation: cut down the number of cigarettes smoked  Other plans: will continue to address this problem.

## 2013-02-01 NOTE — Assessment & Plan Note (Signed)
I will check iron panel. Folate and Vitamin B12

## 2013-02-01 NOTE — Patient Instructions (Addendum)
We will try tramadol and Tylenol for you pain  If the pain persists we will refer you to sports medicine  Please stop smoking  Continue with trying to quit drugs Bring you medications on next visit We will do some blood checks today.  Treatment Goals:  Goals (1 Years of Data) as of 02/01/13         As of Today 01/11/13 01/03/13 06/21/12 05/30/12     Blood Pressure    . Blood Pressure < 140/90  135/83 141/86 135/83 120/81 111/73      Progress Toward Treatment Goals:  Treatment Goal 02/01/2013  Blood pressure at goal  Stop smoking smoking the same amount    Self Care Goals & Plans:  Self Care Goal 02/01/2013  Manage my medications take my medicines as prescribed; bring my medications to every visit; refill my medications on time  Monitor my health -  Eat healthy foods -  Stop smoking cut down the number of cigarettes smoked  Prevent falls -       Care Management & Community Referrals:

## 2013-02-01 NOTE — Assessment & Plan Note (Signed)
Patient has multiple red flags with active/recent polysubstance abuse. I am uncomfortable prescribing any narcotics now or indeed in the future. I will prescribe Ultracet (can increase dose to 2 pills) and if this does not control his pain, I will refer him to [facility]. Pain clinic referral is also an option. I will not order UDS since this will not change management. I will see him in one month.

## 2013-02-01 NOTE — Assessment & Plan Note (Signed)
Referral to GI for colonoscopy. 

## 2013-02-03 NOTE — Progress Notes (Signed)
Case discussed with Dr.Kazibwe at the time of the visit.  We reviewed the resident's history and exam and pertinent patient test results.  I agree with the assessment, diagnosis, and plan of care documented in the resident's note.    

## 2013-02-10 ENCOUNTER — Encounter: Payer: Self-pay | Admitting: Gastroenterology

## 2013-02-23 ENCOUNTER — Ambulatory Visit: Payer: Medicaid Other | Admitting: *Deleted

## 2013-02-23 DIAGNOSIS — F1410 Cocaine abuse, uncomplicated: Secondary | ICD-10-CM

## 2013-02-23 NOTE — Progress Notes (Signed)
Patient ID: Jacob Joseph, male   DOB: [birth year], 57 y.o.   MRN: [id number] Jacob Joseph presented alert, jovial, well-focused in session. He reports 60 days continuous drug abstinence and daily attendance to the 7:30 a.m. NA meeting with his wife. Counselor and patient processed his growing knowledge of basic recovery principles and how effectively Jacob Joseph is able to use 12 step tenets to help him cope with his cocaine addiction. Jacob Joseph shared that his investment in acquiring support and in developing a plan for daily sobriety has been very useful,. Counselor educated patient on the cumulative benefits of continued learning through treatment participation and 12 step meetings attendance. Patient motivation for preserving his present gains appears substantial. We agree to meet again toward end of month for further processing of his recovery skill-building and ability to minimize relapse risk through application of relapse prevention principles & practices. Counselor provided information and dialogue regarding personal development of these tools. Session intervention was effective.   Jacob Joseph, [facility] ([facility])

## 2013-03-01 ENCOUNTER — Encounter: Payer: Self-pay | Admitting: Internal Medicine

## 2013-03-01 ENCOUNTER — Ambulatory Visit (INDEPENDENT_AMBULATORY_CARE_PROVIDER_SITE_OTHER): Payer: Medicaid Other | Admitting: Internal Medicine

## 2013-03-01 ENCOUNTER — Ambulatory Visit (INDEPENDENT_AMBULATORY_CARE_PROVIDER_SITE_OTHER): Payer: Medicaid Other | Admitting: Dietician

## 2013-03-01 VITALS — BP 113/77 | HR 93 | Temp 97.6°F | Ht 72.0 in | Wt 168.9 lb

## 2013-03-01 DIAGNOSIS — R634 Abnormal weight loss: Secondary | ICD-10-CM

## 2013-03-01 DIAGNOSIS — I10 Essential (primary) hypertension: Secondary | ICD-10-CM

## 2013-03-01 DIAGNOSIS — M1990 Unspecified osteoarthritis, unspecified site: Secondary | ICD-10-CM

## 2013-03-01 DIAGNOSIS — M5137 Other intervertebral disc degeneration, lumbosacral region: Secondary | ICD-10-CM

## 2013-03-01 DIAGNOSIS — R627 Adult failure to thrive: Secondary | ICD-10-CM

## 2013-03-01 DIAGNOSIS — M51379 Other intervertebral disc degeneration, lumbosacral region without mention of lumbar back pain or lower extremity pain: Secondary | ICD-10-CM

## 2013-03-01 MED ORDER — ENSURE HIGH PROTEIN PO LIQD: 1.0000 | Freq: Two times a day (BID) | ORAL | Status: DC

## 2013-03-01 NOTE — Patient Instructions (Signed)
General Instructions: I will refer you to physical therapy  Please take ensure as prescribed  I will also send you to [facility] our nutrition specialist Please come back in 2 months      Treatment Goals:  Goals (1 Years of Data) as of 03/01/13         As of Today 02/01/13 01/11/13 01/03/13 06/21/12     Blood Pressure    . Blood Pressure < 140/90  113/77 135/83 141/86 135/83 120/81      Progress Toward Treatment Goals:  Treatment Goal 03/01/2013  Blood pressure at goal  Stop smoking smoking the same amount    Self Care Goals & Plans:  Self Care Goal 03/01/2013  Manage my medications take my medicines as prescribed  Monitor my health keep track of my blood glucose; bring my glucose meter and log to each visit  Eat healthy foods -  Stop smoking -  Prevent falls -       Care Management & Community Referrals:  Referral 03/01/2013  Referrals made to [facility]

## 2013-03-02 ENCOUNTER — Encounter: Payer: Self-pay | Admitting: Dietician

## 2013-03-02 NOTE — Progress Notes (Signed)
Medical Nutrition Therapy:  Appt start time: 1530 end time:  1630.  Assessment:  Primary concerns today: Weight management. /loss Patient with documented 20+ pound weight loss in past 8 months. 10% Usual body weight - moderate- severe unintentional loss. He appears void of subcutaneous fat mass. BMI is low normal limits at present. He reports good appetite, main problem is lack of resources/food availability.  Usual eating pattern includes currently 2 meals and 0-2 snacks per day. Would prefer to eat breakfast, but limited in time due to NA meetings and limited food sources.  Usual physical activity includes ADLs. [name] Kitchen 5 in his household. They do not qualify for food stamps. He gets food from pantry once a month.. Bowels normal for him. Lactose intolerant. Frequent foods include koolaid, doughnuts, soda, water, hot dogs, .  Avoided foods include milk.      Progress Towards Goal(s):  In progress.   Nutritional Diagnosis:  NI-1.4 Inadequate energy intake As related to lack of food resources per patient and possibly education on how to improve caloric intake.  As evidenced by \\his  documented weight loss and loss of subcutaneous fat mass.    Intervention:  Nutrition education about impatc of smoking on weight/metabolism. Nutrition education on ways to increase calorie and protein intake & nutrient density Goal setting: avoid further weight loss. Ideally gain 5-10#   Monitoring/Evaluation:  Dietary intake, exercise, and body weight in 4 week(s).

## 2013-03-02 NOTE — Patient Instructions (Addendum)
Dear [name],  Please schedule a follow up with me in 4 weeks to monitor your intake and weight status. Your family members are encouraged to attend.   First Goal is to maintain your weight and stop weight loss. Second goal is to gain 5 # to weight of 173.8#  Please try to: 1-  Eat something every morning 2- Eat something about every 3 hours- keep snacks with you and easy to eat/grab and get to such as raisins, fruit, nuts, ensure, crackers, pudding, yogurt 3- Add oil to starches and vegetables like rice, potatoes, grits,  4- Try to use milk, tub margarine, oil yogurt, sour cream, ensure  or juice instead of water when cooking to add caloories and protien.  Jacob Joseph, please do not hesitate to contact me. [phone]

## 2013-03-02 NOTE — Assessment & Plan Note (Addendum)
Patient's weight loss is concerning. Clinical history and physical exam does not suggest malignancy. However, his not up-to-date with her colonoscopy. MRI of the pelvis 5 years ago was negative. Chest x-ray films from last year were negative for a chest mass. However, they revealed bilateral atelectasis. GI causes, like malabsorption were considered, but unlikely in a patient without diarrhea. His HIV is well controlled with good viral and immunological response. However, his hepatitis C is untreated, and this can indeed cause, unintentional weight loss. Family history is negative for malignancies. Albumin 12/20/2012 was 3.5. LFTs with AST to 100 otherwise unremarkable.  Plan - Urinalysis - ESR and CRP - Phos and Mg  - Zinc, Copper level - Vitamin B12  - Vitamin D  - Stool for O/P - Consider CMV antibodies  - awaiting colonoscopy on 8/26. I will send an epic message to Dr Russella Dar of GI to ask him whether it would be appropriate to get EGD as well - Referral to [facility] for nutritional education  - will start Ensure in the meantime  - will refer him for HCV clinic evaluation

## 2013-03-02 NOTE — Progress Notes (Signed)
Patient ID: Jacob Joseph, male   DOB: October 24, 1955, 57 y.o.   MRN: [id number]   Subjective:   HPI: [name] is a 57 y.o. gentleman with past medical history below presents for followup visit. Patient has no specific complaints today.   He reports that his attending the NA. Both he and his wife, a recovering addicts from cocaine and prescription drugs. He is concerned about his ongoing weight loss. Chart review reveals that he has lost close to 30% of his body weight over the last 1 year. Currently, weighing 168 pounds, down from 191 pounds last summer. He is treatment for depression with Remeron and Seroquel. He reports good response on this medication.  Further history reveals good appetite. No fevers or chills. No cough, raises the suspicion of pulmonary TB. He denies bloody stools, or melena. No intervening fevers, swollen lymph nodes. Patient is a continuous smoker.  Family history is negative for malignancies. But he reports that he has a significant history of cardiovascular disease. He has an appointment for colonoscopy on the 03/14/2013.   Patient has a history of hepatitis C, for which he has not pursued treatment. He reports that Dr. Orvan Joseph of infectious diseases is following his hepatitis.      Past Medical History  Diagnosis Date  . Hypertension   . HIV (human immunodeficiency virus infection)   . Depression   . Stroke 11/2011    Carotids Doppler negative. Right sided weakness resolved, initially on Plavix for 3 months and then continued with ASA.    . TB lung, latent 1988    Treated  . Calcium oxalate crystals in urine 12/23/2011    Asymptomatic, no hematuria. Advised to take plenty of water.   Current Outpatient Prescriptions  Medication Sig Dispense Refill  . aspirin 81 MG EC tablet Take 4 tablets (325 mg total) by mouth daily.  30 tablet  11  . Blood Pressure KIT 1 kit by Does not apply route 2 (two) times daily.  1 each  0  . Darunavir Ethanolate (PREZISTA)  800 MG tablet Take 1 tablet (800 mg total) by mouth daily with breakfast.  30 tablet  11  . elvitegravir-cobicistat-emtricitabine-tenofovir (STRIBILD) 150-150-200-300 MG TABS Take 1 tablet by mouth daily with breakfast.  30 tablet  11  . lisinopril (PRINIVIL) 10 MG tablet Take 1 tablet (10 mg total) by mouth daily.  30 tablet  5  . megestrol (MEGACE ORAL) 40 MG/ML suspension Take 20 mLs (800 mg total) by mouth daily.  240 mL  5  . metoprolol tartrate (LOPRESSOR) 25 MG tablet Take 1 tablet (25 mg total) by mouth 2 (two) times daily.  60 tablet  11  . mirtazapine (REMERON) 15 MG tablet TAKE ONE TABLET BY MOUTH DAILY AT BEDTIME  30 tablet  5  . Nutritional Supplements (ENSURE HIGH PROTEIN) LIQD Take 1 Can by mouth 2 (two) times daily.  60 Can  1  . QUEtiapine (SEROQUEL) 100 MG tablet Take 1 tablet (100 mg total) by mouth at bedtime.  30 tablet  1  . traMADol-acetaminophen (ULTRACET) 37.5-325 MG per tablet Take 1 tablet by mouth every 6 (six) hours as needed for pain.  120 tablet  0  . zolpidem (AMBIEN) 10 MG tablet Take 10 mg by mouth at bedtime as needed. For sleep       No current facility-administered medications for this visit.   Family History  Problem Relation Age of Onset  . Hypertension Mother   . Hypertension Father   .  Cancer Father    History   Social History  . Marital Status: Married    Spouse Name: N/A    Number of Children: N/A  . Years of Education: N/A   Social History Main Topics  . Smoking status: Current Every Day Smoker -- 0.50 packs/day for 41 years    Types: Cigarettes    Last Attempt to Quit: 04/18/2012  . Smokeless tobacco: Never Used     Comment: reports that he calle dteh quitline yesterday  . Alcohol Use: No  . Drug Use: No     Comment: every 2-3 days prior to stroke in May 2013, stopped using July 2013  . Sexual Activity: Yes    Partners: Female     Comment: declined condoms   Other Topics Concern  . None   Social History Narrative   Pt lives with  wife and grandkid in [place].   Has applied for disability and is pending.   Worked in [place] before about 3 years ago.         Review of Systems: Constitutional: Denies fever, chills, diaphoresis, appetite change and fatigue.  Respiratory: Denies SOB, DOE, cough, chest tightness, and wheezing.  Cardiovascular: No chest pain, palpitations and leg swelling.  Gastrointestinal: No abdominal pain, nausea, vomiting, bloody stools Genitourinary: No dysuria, frequency, hematuria, or flank pain.  Musculoskeletal: Reports ongoing, chronic back pain.  Objective:  Physical Exam: Filed Vitals:   03/01/13 1456  BP: 113/77  Pulse: 93  Temp: 97.6 F (36.4 C)  TempSrc: Oral  Height: 6' (1.829 m)  Weight: 168 lb 14.4 oz (76.613 kg)  SpO2: 100%   General: Well nourished. No acute distress.  Lungs: CTA bilaterally. Heart: RRR; no extra sounds or murmurs  Abdomen: Non-distended, normal BS, soft, nontender; no hepatosplenomegaly  Extremities: No pedal edema. No joint swelling or tenderness. Neurologic: Alert and oriented x3. No obvious neurologic deficits.  Assessment & Plan:  I have discussed my assessment and plan  with Dr. Meredith Joseph  as detailed under problem based charting.

## 2013-03-06 ENCOUNTER — Other Ambulatory Visit (INDEPENDENT_AMBULATORY_CARE_PROVIDER_SITE_OTHER): Payer: Medicaid Other

## 2013-03-06 DIAGNOSIS — R634 Abnormal weight loss: Secondary | ICD-10-CM

## 2013-03-06 LAB — COMPLETE METABOLIC PANEL WITH GFR
Albumin: 4.1 g/dL (ref 3.5–5.2)
BUN: 13 mg/dL (ref 6–23)
CO2: 18 mEq/L — ABNORMAL LOW (ref 19–32)
Calcium: 9.9 mg/dL (ref 8.4–10.5)
Chloride: 104 mEq/L (ref 96–112)
Creat: 0.87 mg/dL (ref 0.50–1.35)
GFR, Est African American: >89 mL/min
GFR, Est Non African American: >89 mL/min
Glucose, Bld: 91 mg/dL (ref 70–99)
Potassium: 4.3 mEq/L (ref 3.5–5.3)

## 2013-03-06 LAB — C-REACTIVE PROTEIN: CRP: <0.5 mg/dL (ref <0.60)

## 2013-03-06 LAB — PSA: PSA: 0.6 ng/mL (ref <=4.00)

## 2013-03-06 LAB — PHOSPHORUS: Phosphorus: 3.4 mg/dL (ref 2.3–4.6)

## 2013-03-06 LAB — MAGNESIUM: Magnesium: 1.9 mg/dL (ref 1.5–2.5)

## 2013-03-07 LAB — OVA AND PARASITE SCREEN: OP: NONE SEEN

## 2013-03-07 LAB — URINALYSIS, MICROSCOPIC ONLY
Crystals: NONE SEEN
Squamous Epithelial / LPF: NONE SEEN

## 2013-03-07 LAB — URINALYSIS, ROUTINE W REFLEX MICROSCOPIC
Bilirubin Urine: NEGATIVE
Ketones, ur: NEGATIVE mg/dL
Specific Gravity, Urine: 1.02 (ref 1.005–1.030)
Urobilinogen, UA: 1 mg/dL (ref 0.0–1.0)
pH: 7 (ref 5.0–8.0)

## 2013-03-07 LAB — VITAMIN D 25 HYDROXY (VIT D DEFICIENCY, FRACTURES): Vit D, 25-Hydroxy: 27 ng/mL — ABNORMAL LOW (ref 30–89)

## 2013-03-14 ENCOUNTER — Encounter: Payer: Self-pay | Admitting: Gastroenterology

## 2013-03-14 ENCOUNTER — Ambulatory Visit (INDEPENDENT_AMBULATORY_CARE_PROVIDER_SITE_OTHER): Payer: Medicaid Other | Admitting: Gastroenterology

## 2013-03-14 VITALS — BP 130/84 | HR 96 | Ht 72.0 in | Wt 168.0 lb

## 2013-03-14 DIAGNOSIS — R634 Abnormal weight loss: Secondary | ICD-10-CM

## 2013-03-14 DIAGNOSIS — R197 Diarrhea, unspecified: Secondary | ICD-10-CM

## 2013-03-14 DIAGNOSIS — B182 Chronic viral hepatitis C: Secondary | ICD-10-CM

## 2013-03-14 MED ORDER — PEG-KCL-NACL-NASULF-NA ASC-C 100 G PO SOLR: 1.0000 | Freq: Once | ORAL | Status: DC

## 2013-03-14 NOTE — Patient Instructions (Addendum)
You have been given a separate informational sheet regarding your tobacco use, the importance of quitting and local resources to help you quit.  Your physician has requested that you go to the basement for lab work before leaving today.  Please follow instructions on Hemoccult cards and mail them back to [place] when finished.    You have been scheduled for a CT scan of the abdomen and pelvis at [facility] ([address]---this is in the same building as Architectural technologist).   You are scheduled on 03/17/13 at 9:00am. You should arrive 15 minutes prior to your appointment time for registration. Please follow the written instructions below on the day of your exam:  WARNING: IF YOU ARE ALLERGIC TO IODINE/X-RAY DYE, PLEASE NOTIFY RADIOLOGY IMMEDIATELY AT [phone]! YOU WILL BE GIVEN A 13 HOUR PREMEDICATION PREP.  1) Do not eat or drink anything after 5:00am (4 hours prior to your test) 2) You have been given 2 bottles of oral contrast to drink. The solution may taste better if refrigerated, but do NOT add ice or any other liquid to this solution. Shake well before drinking.    Drink 1 bottle of contrast @ 7:00am (2 hours prior to your exam)  Drink 1 bottle of contrast @ 8:00am (1 hour prior to your exam)  You may take any medications as prescribed with a small amount of water except for the following: Metformin, Glucophage, Glucovance, Avandamet, Riomet, Fortamet, Actoplus Met, Janumet, Glumetza or Metaglip. The above medications must be held the day of the exam AND 48 hours after the exam.  The purpose of you drinking the oral contrast is to aid in the visualization of your intestinal tract. The contrast solution may cause some diarrhea. Before your exam is started, you will be given a small amount of fluid to drink. Depending on your individual set of symptoms, you may also receive an intravenous injection of x-ray contrast/dye. Plan on being at [facility] for 30 minutes or  long, depending on the type of exam you are having performed.  If you have any questions regarding your exam or if you need to reschedule, you may call the CT department at [phone] between the hours of 8:00 am and 5:00 pm, Monday-Friday.  ________________________________________________________________________  Bonita Quin have been scheduled for an endoscopy and colonoscopy with propofol. Please follow the written instructions given to you at your visit today. Please pick up your prep at the pharmacy within the next 1-3 days. If you use inhalers (even only as needed), please bring them with you on the day of your procedure. Your physician has requested that you go to www.startemmi.com and enter the access code given to you at your visit today. This web site gives a general overview about your procedure. However, you should still follow specific instructions given to you by our office regarding your preparation for the procedure.  Thank you for choosing me and [facility].  Venita Lick. Pleas Koch., MD., Clementeen Graham

## 2013-03-14 NOTE — Progress Notes (Signed)
History of Present Illness: This is a 57 year old male accompanied by his wife. He has HIV, Hep C, hypertension, polysubstance abuse, depression, and chronic low back pain both followed by ID and [facility] clinic. He noted weight loss while abusing cocaine and states he is now clean for 90 day. He has diarrhea intermittently for a few years and takes Imodium frequently. His weight is now stable but cannot gain weight. Nutrition consult feels his calorie intake is too low. He has a mild anemia with a ferritin of 1376 and the rest of the anemia panel was unremarkable. Stool for O&P was negative. He occasionally notes some mild lower abdominal pain just before bowel movement and then the pain resolves. Denies constipation, change in stool caliber, melena, hematochezia, nausea, vomiting, dysphagia, reflux symptoms, chest pain.  Review of Systems: Pertinent positive and negative review of systems were noted in the above HPI section. All other review of systems were otherwise negative.  Current Medications, Allergies, Past Medical History, Past Surgical History, Family History and Social History were reviewed in [employer] record.  Physical Exam: General: Well developed , well nourished, chronically ill appearing, no acute distress Head: Normocephalic and atraumatic Eyes:  sclerae anicteric, EOMI Ears: Normal auditory acuity Mouth: No deformity or lesions Neck: Supple, no masses or thyromegaly Lungs: Clear throughout to auscultation Heart: Regular rate and rhythm; no murmurs, rubs or bruits Abdomen: Soft, non tender and non distended. No masses, hepatosplenomegaly or hernias noted. Normal Bowel sounds Rectal: deferred to colonoscopy Musculoskeletal: Symmetrical with no gross deformities  Skin: No lesions on visible extremities Pulses:  Normal pulses noted Extremities: No clubbing, cyanosis, edema or deformities noted Neurological: Alert oriented x 4, grossly nonfocal Cervical  Nodes:  No significant cervical adenopathy Inguinal Nodes: No significant inguinal adenopathy Psychological:  Alert and cooperative. Depressed affect.  Assessment and Recommendations:  1. Weight loss occurred about 1 year ago. BMI=22.78. Now his weight is stable but unable to gain. Possible inadequate calorie intake. Possibly related to known underlying medical problems. R/O ulcer, UGI neoplasm, other occult neoplasm. Schedule EGD and abd/pelvic CT. The risks, benefits, and alternatives to endoscopy with possible biopsy and possible dilation were discussed with the patient and they consent to proceed.   2. Diarrhea. Stool pathogen panel, hemoccults and colonoscopy. The risks, benefits, and alternatives to colonoscopy with possible biopsy and possible polypectomy were discussed with the patient and they consent to proceed.   3. Anemia, chronic disease pattern. Follow up with PCP.  4. HIV and Hep C followed by ID.

## 2013-03-15 ENCOUNTER — Other Ambulatory Visit: Payer: Medicaid Other

## 2013-03-15 DIAGNOSIS — R634 Abnormal weight loss: Secondary | ICD-10-CM

## 2013-03-15 DIAGNOSIS — R197 Diarrhea, unspecified: Secondary | ICD-10-CM

## 2013-03-16 ENCOUNTER — Ambulatory Visit: Payer: Medicaid Other | Admitting: *Deleted

## 2013-03-16 LAB — GASTROINTESTINAL PATHOGEN PANEL PCR
Cryptosporidium, PCR: NEGATIVE
E coli 0157, PCR: NEGATIVE
Giardia lamblia, PCR: NEGATIVE
Rotavirus A, PCR: NEGATIVE
Shigella, PCR: NEGATIVE

## 2013-03-17 ENCOUNTER — Ambulatory Visit (INDEPENDENT_AMBULATORY_CARE_PROVIDER_SITE_OTHER)
Admission: RE | Admit: 2013-03-17 | Discharge: 2013-03-17 | Disposition: A | Discharge status: Home / Self Care | Payer: Medicaid Other | Source: Ambulatory Visit | Attending: Gastroenterology | Admitting: Gastroenterology

## 2013-03-17 DIAGNOSIS — R197 Diarrhea, unspecified: Secondary | ICD-10-CM

## 2013-03-17 DIAGNOSIS — R634 Abnormal weight loss: Secondary | ICD-10-CM

## 2013-03-17 MED ORDER — IOHEXOL 300 MG/ML  SOLN
100.0000 mL | Freq: Once | INTRAMUSCULAR | Status: AC | PRN
  Administered 2013-03-17: 100 mL via INTRAVENOUS

## 2013-03-21 NOTE — Addendum Note (Signed)
Addended by: Neomia Dear on: 03/21/2013 08:16 PM   Modules accepted: Orders

## 2013-03-22 ENCOUNTER — Encounter: Payer: Self-pay | Admitting: Internal Medicine

## 2013-03-22 ENCOUNTER — Ambulatory Visit (INDEPENDENT_AMBULATORY_CARE_PROVIDER_SITE_OTHER): Payer: Medicaid Other | Admitting: Internal Medicine

## 2013-03-22 ENCOUNTER — Ambulatory Visit (INDEPENDENT_AMBULATORY_CARE_PROVIDER_SITE_OTHER): Payer: Medicaid Other | Admitting: Dietician

## 2013-03-22 VITALS — Wt 167.0 lb

## 2013-03-22 VITALS — BP 110/81 | HR 100 | Temp 96.6°F | Ht 72.0 in | Wt 167.6 lb

## 2013-03-22 DIAGNOSIS — B20 Human immunodeficiency virus [HIV] disease: Secondary | ICD-10-CM

## 2013-03-22 DIAGNOSIS — Z299 Encounter for prophylactic measures, unspecified: Secondary | ICD-10-CM

## 2013-03-22 DIAGNOSIS — R197 Diarrhea, unspecified: Secondary | ICD-10-CM | POA: Insufficient documentation

## 2013-03-22 DIAGNOSIS — I10 Essential (primary) hypertension: Secondary | ICD-10-CM

## 2013-03-22 DIAGNOSIS — R634 Abnormal weight loss: Secondary | ICD-10-CM

## 2013-03-22 DIAGNOSIS — E559 Vitamin D deficiency, unspecified: Secondary | ICD-10-CM

## 2013-03-22 DIAGNOSIS — N281 Cyst of kidney, acquired: Secondary | ICD-10-CM

## 2013-03-22 DIAGNOSIS — Z23 Encounter for immunization: Secondary | ICD-10-CM

## 2013-03-22 DIAGNOSIS — R627 Adult failure to thrive: Secondary | ICD-10-CM

## 2013-03-22 DIAGNOSIS — Q618 Other cystic kidney diseases: Secondary | ICD-10-CM

## 2013-03-22 MED ORDER — VITAMIN E 180 MG (400 UNIT) PO CAPS: 400.0000 [IU] | ORAL_CAPSULE | Freq: Every day | ORAL | Status: DC

## 2013-03-22 MED ORDER — ENSURE HIGH PROTEIN PO LIQD: 1.0000 | Freq: Two times a day (BID) | ORAL | Status: DC

## 2013-03-22 NOTE — Assessment & Plan Note (Signed)
Patient is otherwise asymptomatic. Ordered MRI of abdomen and pelvis with contrast to further characterize this left renal cyst.

## 2013-03-22 NOTE — Progress Notes (Signed)
Medical Nutrition Therapy:  Appt start time: 1540 end time:  1640.  Assessment:  Primary concerns today: Weight management./loss Patient with 1.9# loss since last visit. He reports diarrhea ongoing for past 2-3 weeks and his usual imodium is not working. No new medicine, ran out of ensure- drank 2/day until case gone. [facility] case manager assisting with food/resources. Had been eating well, but is now cutting back on food and fluid.He continues to report a good appetite. Estimated 24 hours recall ~ 1700 calories,- approximately 500+/-200 calories needed for weight gain  Progress Towards Goal(s):  No progress.   Nutritional Diagnosis:  NI-1.4 Inadequate energy intake As related to lack of food resources and now diarrhea.  As evidenced by documented weight loss and loss of subcutaneous fat mass.    Intervention:  Nutrition education about fiber effect on GI transi time, recommendation for 2-4liters fluid/day with calories to avoid dehydration.  Nutrition education on food to limit- insoluble fiber, sugar alcohol, food additives Goal setting: avoid further weight loss, weight gain once diarrhea resolved.  Psyllium trial provided from food pantry today.   Monitoring/Evaluation:  Dietary intake, exercise, and body weight in 4 week(s).

## 2013-03-22 NOTE — Patient Instructions (Addendum)
Low-Fiber Diet Insoluble Fiber is found in fruits, vegetables, and grains. A low-insoluble fiber diet restricts fibrous foods that are not digested in the small intestine.   Sometimes soluble fiber such as psyllium can be used to bulk the stool. Metamucil and Benefiber are brand names of psyllium fiber. You may want to gradully increase this type of fiber in your diet. can begin using psyllium once a day , then increase to 2 then 3 times a day as tolerated.    PURPOSE  To prevent blockage of a partially obstructed or narrowed gastrointestinal tract.  To reduce fecal weight and volume.  To slow the movement of feces. WHEN IS THIS DIET USED?  It may be used during the acute phase of Crohn disease, ulcerative colitis, regional enteritis, or diverticulitis.  It may be used if your intestinal or esophageal tubes are narrowing (stenosis).  It may be used as a transitional diet following surgery, injury (trauma), or illness. CHOOSING FOODS Check labels, especially on foods from the starch list. Often times, dietary fiber content is listed on the nutrition facts panel.  Breads and Starches  Allowed: White, [place], and pita breads, plain rolls, buns, or sweet rolls, doughnuts, waffles, pancakes, bagels. Plain muffins, biscuits, matzoth. Soda, saltine, graham crackers. Pretzels, rusks, melba toast, zwieback. Cooked cereals: cornmeal, farina, or cream cereals. Dry cereals: refined corn, wheat, rice, and oat cereals (check label). Potatoes prepared any way without skins, refined macaroni, spaghetti, noodles, refined rice.  Avoid: Whole-wheat bread, rolls, and crackers. Multigrains, rye, bran seeds, nuts, or coconut. Cereals containing whole grains, multigrains, bran, coconut, nuts, raisins. Cooked or dry oatmeal. Coarse wheat cereals, granola. Cereals advertised as "high fiber." Potato skins. Whole-grain pasta, wild or brown rice. Popcorn. Vegetables  Allowed: Strained tomato and vegetable juices.  Fresh lettuce, cucumber, spinach. Well-cooked or canned: asparagus, bean sprouts, broccoli, cut green beans, cauliflower, pumpkin, beets, mushrooms, olives, yellow squash, tomato, tomato sauce, zucchini, turnips.Keep servings limited to  cup.  Avoid: Fresh, cooked, or canned: artichokes, baked beans, beet greens, Brussels sprouts, corn, kale, legumes, peas, sweet potatoes. Avoid large servings of any vegetables. Fruit  Allowed: All fruit juices except prune juice. Cooked or canned fruits without skin and seeds: apricots, applesauce, cantaloupe, cherries, grapefruit, grapes, kiwi, mandarin oranges, peaches, pears, fruit cocktail, pineapple, plums, watermelon. Fresh without skin: banana, grapes, cantaloupe, avocado, cherries, pineapple, kiwi, nectarines, peaches, blueberries. Keep servings limited to  cup or 1 piece.  Avoid: Fresh: apples with or without skin, apricots, mangoes, pears, raspberries, strawberries. Prune juice and juices with pulp, stewed or dried prunes. Dried fruits, raisins, dates. Avoid large servings of all fresh fruits. Meat and Protein Substitutes  Allowed: Ground or well-cooked tender beef, ham, veal, lamb, pork, poultry. Eggs, plain cheese. Fish, oysters, shrimp, lobster, other seafood. Liver, organ meats. Smooth nut butters.  Avoid: Tough, fibrous meats with gristle. Chunky nut butter.Cheese with seeds, nuts, or other foods not allowed. Nuts, seeds, legumes, dried peas, beans, lentils. Dairy  Allowed: All milk products except those not allowed.  Avoid: Yogurt or cheese that contains nuts, seeds, or added fruit. Soups and Combination Foods  Allowed: Bouillon, broth, or cream soups made from allowed foods. Any strained soup. Casseroles or mixed dishes made with allowed foods.  Avoid: Soups made from vegetables that are not allowed or that contain other foods not allowed. Desserts and Sweets  Allowed:Plain cakes and cookies, pie made with allowed fruit, pudding,  custard, cream pie. Gelatin, fruit, ice, sherbet, frozen ice pops. Ice cream, ice milk  without nuts. Plain hard candy, honey, jelly, molasses, syrup, sugar, chocolate syrup, gumdrops, marshmallows.  Avoid: Desserts, cookies, or candies that contain nuts, peanut butter, dried fruits. Jams, preserves with seeds, marmalade. Fats and Oils  Allowed:Margarine, butter, cream, mayonnaise, salad oils, plain salad dressings made from allowed foods.  Avoid: Seeds, nuts, olives. Beverages  Allowed: All, except those listed to avoid.  Avoid: Fruit juices with high pulp, prune juice. Condiments  Allowed:Ketchup, mustard, horseradish, vinegar, cream sauce, cheese sauce, cocoa powder. Spices in moderation: allspice, basil, bay leaves, celery powder or leaves, cinnamon, cumin powder, curry powder, ginger, mace, marjoram, onion or garlic powder, oregano, paprika, parsley flakes, ground pepper, rosemary, sage, savory, tarragon, thyme, turmeric.  Avoid: Coconut, pickles. SAMPLE MENU Breakfast   cup orange or any type of juice.  1-3 boiled eggs.  2 slice white toast.  Margarine.   cup cornflakes.withj juice on it.   1- 2  cups sweet tea   Lunch   cup chicken noodle soup.  2 to 3 oz sliced roast beef.  2 slices white bread.  Mayonnaise.   cup tomato juice.  1 small banana.  Beverage.- sweet tea Dinner  3 oz baked chicken.   cup scalloped potatoes.   cup cooked beets.  White dinner roll.  Margarine.   cup canned peaches.  Beverage.- sweet tea    Please make a follow up in 4 weeks

## 2013-03-22 NOTE — Assessment & Plan Note (Signed)
Gave flu shot today Awaiting colonoscopy +/- EGD

## 2013-03-22 NOTE — Patient Instructions (Signed)
General Instructions: Please take plenty of fluids  Please take hold off on the lisinopril since you have diarrhea. We can restart it once the diarrhea resolves We will order some urine exam and blood check today  We will order an MRI to evaluate for your cyst in the kidney  Please take vitamin D pills  Please follow back in 2 weeks to have your blood pressure checked  Treatment Goals:  Goals (1 Years of Data) as of 03/22/13         As of Today 03/14/13 03/01/13 02/01/13 01/11/13     Blood Pressure    . Blood Pressure < 140/90  116/77 130/84 113/77 135/83 141/86      Progress Toward Treatment Goals:  Treatment Goal 03/22/2013  Blood pressure at goal  Stop smoking -    Self Care Goals & Plans:  Self Care Goal 03/22/2013  Manage my medications refill my medications on time; bring my medications to every visit; take my medicines as prescribed  Monitor my health -  Eat healthy foods -  Stop smoking -  Prevent falls -       Care Management & Community Referrals:  Referral 03/01/2013  Referrals made to community resources nutrition     Diarrhea Diarrhea is frequent loose and watery bowel movements. It can cause you to feel weak and dehydrated. Dehydration can cause you to become tired and thirsty, have a dry mouth, and have decreased urination that often is dark yellow. Diarrhea is a sign of another problem, most often an infection that will not last long. In most cases, diarrhea typically lasts 2 3 days. However, it can last longer if it is a sign of something more serious. It is important to treat your diarrhea as directed by your caregive to lessen or prevent future episodes of diarrhea. CAUSES  Some common causes include:  Gastrointestinal infections caused by viruses, bacteria, or parasites.  Food poisoning or food allergies.  Certain medicines, such as antibiotics, chemotherapy, and laxatives.  Artificial sweeteners and fructose.  Digestive disorders. HOME CARE  INSTRUCTIONS  Ensure adequate fluid intake (hydration): have 1 cup (8 oz) of fluid for each diarrhea episode. Avoid fluids that contain simple sugars or sports drinks, fruit juices, whole milk products, and sodas. Your urine should be clear or pale yellow if you are drinking enough fluids. Hydrate with an oral rehydration solution that you can purchase at pharmacies, retail stores, and online. You can prepare an oral rehydration solution at home by mixing the following ingredients together:    tsp table salt.   tsp baking soda.   tsp salt substitute containing potassium chloride.  1  tablespoons sugar.  1 L (34 oz) of water.  Certain foods and beverages may increase the speed at which food moves through the gastrointestinal (GI) tract. These foods and beverages should be avoided and include:  Caffeinated and alcoholic beverages.  High-fiber foods, such as raw fruits and vegetables, nuts, seeds, and whole grain breads and cereals.  Foods and beverages sweetened with sugar alcohols, such as xylitol, sorbitol, and mannitol.  Some foods may be well tolerated and may help thicken stool including:  Starchy foods, such as rice, toast, pasta, low-sugar cereal, oatmeal, grits, baked potatoes, crackers, and bagels.  Bananas.  Applesauce.  Add probiotic-rich foods to help increase healthy bacteria in the GI tract, such as yogurt and fermented milk products.  Wash your hands well after each diarrhea episode.  Only take over-the-counter or prescription medicines as directed  by your caregiver.  Take a warm bath to relieve any burning or pain from frequent diarrhea episodes. SEEK IMMEDIATE MEDICAL CARE IF:   You are unable to keep fluids down.  You have persistent vomiting.  You have blood in your stool, or your stools are black and tarry.  You do not urinate in 6 8 hours, or there is only a small amount of very dark urine.  You have abdominal pain that increases or localizes.  You  have weakness, dizziness, confusion, or lightheadedness.  You have a severe headache.  Your diarrhea gets worse or does not get better.  You have a fever or persistent symptoms for more than 2 3 days.  You have a fever and your symptoms suddenly get worse. MAKE SURE YOU:   Understand these instructions.  Will watch your condition.  Will get help right away if you are not doing well or get worse. Document Released: 06/26/2002 Document Revised: 06/22/2012 Document Reviewed: 03/13/2012 [facility] Patient Information 2014 [place], [place].

## 2013-03-22 NOTE — Assessment & Plan Note (Signed)
Cause still not very obvious but likely multifactorial, including hepatitis C, occult malignancy and HIV. Tests for nutritional deficiency were only noted for a mild vitamin D deficiency. Abdominal CT scan revealed left kidney complex mass. Patient is otherwise asymptomatic without hematuria. Also noted a paraprotein gain of 4.3, with increased ferritin level.   Plan  - Will order abd/pelvic MRI w contrast to further characterize the renal cyst - UPEP and SPEP to evaluate for elevated paraprotein gap - Continue with nutritional supplements  - Has a scheduled colonoscopy, plus or minus EGD by Dr. Russella Dar in October - Scheduled appointment at the hepatitis C clinic

## 2013-03-22 NOTE — Assessment & Plan Note (Signed)
Good virological and immunologic response of his HIV. Noted recent change of his antiretroviral regimen to Stribild and Prezista. Both these medications have been reports to cause diarrhea in excess of 10% of patients. There is likely temporal relationship between this change and onset of his diarrhea.   Plan  - will defer HAART management to ID - I advised the patient to discuss with Dr Orvan Falconer on his next visit about the possibility of drug-induced diarrhea.

## 2013-03-22 NOTE — Assessment & Plan Note (Signed)
Noted some orthostasis with a BP 118/81, pulse of 81 on lying, and 110/81 with a pulse of 100 on standing . Advised the patient will hold his lisinopril in the setting of diarrhea and orthostasis. Encouraged the patient to increase his fluid intake. Also, advised him that if he experiences increased dizziness to call the clinic as we can go ahead and hold his metoprolol too.

## 2013-03-22 NOTE — Progress Notes (Signed)
Patient ID: Jacob Joseph, male   DOB: 10-10-1955, 57 y.o.   MRN: [id number]   Subjective:   HPI: [name] is a 57 y.o. gentleman with past medical history of hypertension, HIV, depression, among other problems, presents to the clinic for followup visit after I saw pt on 8/26.  [name] only complaint is ongoing diarrhea. He did not mention to me this problem during our last visit. He reports that his diarrhea is about 10-12 nonbloody, loose motions without vomiting, or nausea, fevers, increased fatigue or chills. His appetite has remained unchanged. He tries to eat a lot to gain wt as he was recently advised by our dietician. He complains of mild generalized abdominal pain. He reports that he has cut back on his fluid intake in fear of exacerbating his diarrhea. He reports an episode of increased dizziness, especially when he gets up from a sitting position. He denies recent history of falls or syncope episodes. He has continued to take his metoprolol and lisinopril as prescribed. Patient was evaluated by  Dr. Russella Dar of gastroenterology, who ordered a GI panel for diarrhea which came out negative.   In 12/2012, Dr Orvan Falconer changed his antiretroviral regimen to Stribild and Prezista and started him on Megace. No other recent changes in his medications. He reports that his diarrhea started before he initiated Ensure supplements.   Kindly see the A&P for the status of the pt's chronic medical problems.     Past Medical History  Diagnosis Date  . Hypertension   . HIV (human immunodeficiency virus infection)   . Depression   . Stroke 11/2011    Carotids Doppler negative. Right sided weakness resolved, initially on Plavix for 3 months and then continued with ASA.    . TB lung, latent 1988    Treated  . Calcium oxalate crystals in urine 12/23/2011    Asymptomatic, no hematuria. Advised to take plenty of water.   Current Outpatient Prescriptions  Medication Sig Dispense Refill  .  Blood Pressure KIT 1 kit by Does not apply route 2 (two) times daily.  1 each  0  . Darunavir Ethanolate (PREZISTA) 800 MG tablet Take 1 tablet (800 mg total) by mouth daily with breakfast.  30 tablet  11  . elvitegravir-cobicistat-emtricitabine-tenofovir (STRIBILD) 150-150-200-300 MG TABS Take 1 tablet by mouth daily with breakfast.  30 tablet  11  . lisinopril (PRINIVIL) 10 MG tablet Take 1 tablet (10 mg total) by mouth daily.  30 tablet  5  . loperamide (IMODIUM A-D) 2 MG tablet Take 2 mg by mouth 4 (four) times daily as needed for diarrhea or loose stools.      . megestrol (MEGACE ORAL) 40 MG/ML suspension Take 20 mLs (800 mg total) by mouth daily.  240 mL  5  . metoprolol tartrate (LOPRESSOR) 25 MG tablet Take 1 tablet (25 mg total) by mouth 2 (two) times daily.  60 tablet  11  . mirtazapine (REMERON) 15 MG tablet TAKE ONE TABLET BY MOUTH DAILY AT BEDTIME  30 tablet  5  . Nutritional Supplements (ENSURE HIGH PROTEIN) LIQD Take 1 Can by mouth 2 (two) times daily.  60 Can  3  . peg 3350 powder (MOVIPREP) 100 G SOLR Take 1 kit (200 g total) by mouth once.  1 kit  0  . psyllium (HYDROCIL/METAMUCIL) 95 % PACK Take 1 packet by mouth daily.      . QUEtiapine (SEROQUEL) 100 MG tablet Take 150 mg by mouth at bedtime.      [name] Kitchen  traMADol-acetaminophen (ULTRACET) 37.5-325 MG per tablet Take 1 tablet by mouth every 6 (six) hours as needed for pain.  120 tablet  0  . vitamin E (VITAMIN E) 400 UNIT capsule Take 1 capsule (400 Units total) by mouth daily.  60 capsule  2  . zolpidem (AMBIEN) 10 MG tablet Take 10 mg by mouth at bedtime as needed. For sleep       No current facility-administered medications for this visit.   Family History  Problem Relation Age of Onset  . Hypertension Mother   . Hypertension Father   . Cancer Father    History   Social History  . Marital Status: Married    Spouse Name: N/A    Number of Children: N/A  . Years of Education: N/A   Social History Main Topics  . Smoking  status: Current Every Day Smoker -- 0.50 packs/day for 41 years    Types: Cigarettes    Last Attempt to Quit: 04/18/2012  . Smokeless tobacco: Never Used     Comment: reports that he calle dteh quitline yesterday  . Alcohol Use: No  . Drug Use: No     Comment: every 2-3 days prior to stroke in May 2013, stopped using July 2013  . Sexual Activity: Yes    Partners: Female     Comment: declined condoms   Other Topics Concern  . None   Social History Narrative   Pt lives with wife and grandkid in [place].   Has applied for disability and is pending.   Worked in [place] before about 3 years ago.         Review of Systems: Respiratory: Denies SOB, DOE, cough, chest tightness, and wheezing.  Cardiovascular: No chest pain, palpitations and leg swelling.  Genitourinary: No dysuria, frequency, hematuria, or flank pain.   Objective:  Physical Exam: Filed Vitals:   03/22/13 1524 03/22/13 1608 03/22/13 1609 03/22/13 1610  BP: 116/77 118/81 121/86 110/81  Pulse: 98 81 90 100  Temp: 96.6 F (35.9 C)     TempSrc: Oral     Height: 6' (1.829 m)     Weight: 167 lb 9.6 oz (76.023 kg)     SpO2: 100%      General: appears comfortable. No acute distress.  Lungs: CTA bilaterally. Heart: RRR; no extra sounds or murmurs  Abdomen: Non-distended, normal BS, soft, nontender; no hepatosplenomegaly  Extremities: No pedal edema. No joint swelling or tenderness. Neurologic: Alert and oriented x3. No obvious neurologic deficits.  Assessment & Plan:  I have discussed my assessment and plan  with Dr. Criselda Peaches  as detailed under problem based charting.

## 2013-03-22 NOTE — Assessment & Plan Note (Signed)
Etiology of his diarrhea still unclear. GI panel negative. Possibilities include Stribild and Prezista, which both have been reported to cause diarrhea in more than 10%. Patient's current symptoms of dizziness could be related to dehydration and orthostasis. Encouraged him to continue with his medications and to discuss this with his ID physician regarding drug-induced diarrhea on his next visit. I also encouraged him to improve on his fluid intake. Awaiting colonoscopy by Dr Russella Dar in 04/2013.

## 2013-03-23 ENCOUNTER — Telehealth: Payer: Self-pay | Admitting: *Deleted

## 2013-03-23 NOTE — Telephone Encounter (Signed)
[facility] called for a refill authorization on Lisinopril 10mg  QD. I believe his PCP Dr. Zada Girt is managing his non-HIV medications, now. Is this correct?

## 2013-03-23 NOTE — Telephone Encounter (Signed)
I am holding his Lisinopril for now in the setting of on going diarrhea. No refills. I will request Mamie call his pharmacy to let them know that no refills at this time. We can reorder it at a later date. Thanks.

## 2013-03-23 NOTE — Telephone Encounter (Signed)
[facility] pharmacy called and informed.

## 2013-03-24 LAB — PROTEIN ELECTROPH W RFLX QUANT IMMUNOGLOBULINS
M-Spike, %: NOT DETECTED g/dL
Total Protein ELP: 7.9 g/dL (ref 6.0–8.3)

## 2013-03-27 LAB — PROTEIN ELECTROPHORESIS, URINE REFLEX
Albumin: 13.3 %
Alpha-1-Globulin, U: 33.8 %
Alpha-2-Globulin, U: 9.8 %
Beta Globulin, U: 17.1 %
Gamma Globulin, U: 26 %
Total Protein, Urine: 99 mg/dL

## 2013-03-29 NOTE — Progress Notes (Signed)
Case discussed with Dr.Kazibwe at the time of the visit.  We reviewed the resident's history and exam and pertinent patient test results.  I agree with the assessment, diagnosis, and plan of care documented in the resident's note.    

## 2013-04-04 ENCOUNTER — Other Ambulatory Visit: Payer: Medicaid Other

## 2013-04-04 DIAGNOSIS — B20 Human immunodeficiency virus [HIV] disease: Secondary | ICD-10-CM

## 2013-04-04 LAB — COMPLETE METABOLIC PANEL WITH GFR
Albumin: 3.7 g/dL (ref 3.5–5.2)
Alkaline Phosphatase: 66 U/L (ref 39–117)
CO2: 22 mEq/L (ref 19–32)
GFR, Est Non African American: >89 mL/min
Glucose, Bld: 112 mg/dL — ABNORMAL HIGH (ref 70–99)
Potassium: 4.2 mEq/L (ref 3.5–5.3)
Sodium: 132 mEq/L — ABNORMAL LOW (ref 135–145)
Total Protein: 7.7 g/dL (ref 6.0–8.3)

## 2013-04-04 LAB — CBC
Hemoglobin: 13.3 g/dL (ref 13.0–17.0)
MCHC: 34.7 g/dL (ref 30.0–36.0)
RBC: 4.28 MIL/uL (ref 4.22–5.81)
WBC: 10.9 10*3/uL — ABNORMAL HIGH (ref 4.0–10.5)

## 2013-04-04 LAB — LIPID PANEL: Total CHOL/HDL Ratio: 2.6 Ratio

## 2013-04-05 ENCOUNTER — Encounter: Payer: Self-pay | Admitting: Internal Medicine

## 2013-04-05 ENCOUNTER — Ambulatory Visit (INDEPENDENT_AMBULATORY_CARE_PROVIDER_SITE_OTHER): Payer: Medicaid Other | Admitting: Internal Medicine

## 2013-04-05 VITALS — BP 122/72 | HR 99 | Temp 97.4°F | Ht 72.0 in | Wt 176.1 lb

## 2013-04-05 DIAGNOSIS — N281 Cyst of kidney, acquired: Secondary | ICD-10-CM

## 2013-04-05 DIAGNOSIS — R197 Diarrhea, unspecified: Secondary | ICD-10-CM

## 2013-04-05 DIAGNOSIS — R634 Abnormal weight loss: Secondary | ICD-10-CM

## 2013-04-05 DIAGNOSIS — B1710 Acute hepatitis C without hepatic coma: Secondary | ICD-10-CM

## 2013-04-05 DIAGNOSIS — Q618 Other cystic kidney diseases: Secondary | ICD-10-CM

## 2013-04-05 LAB — T-HELPER CELL (CD4) - (RCID CLINIC ONLY)
CD4 % Helper T Cell: 17 % — ABNORMAL LOW (ref 33–55)
CD4 T Cell Abs: 630 /uL (ref 400–2700)

## 2013-04-05 MED ORDER — ENSURE HIGH PROTEIN PO LIQD: 1.0000 | Freq: Two times a day (BID) | ORAL | Status: DC

## 2013-04-05 NOTE — Assessment & Plan Note (Signed)
Pelvic MRI with contrast not yet performed. Appointment has been scheduled on 04/13/2013. I will followup closely.

## 2013-04-05 NOTE — Patient Instructions (Signed)
General Instructions: Be sure to get your MRI done on Thursday 04/13/2013 I will call you with the results  Please you may take your blood pressure medications since your diarrhea has improved  Please follow up in 2 months.     Treatment Goals:  Goals (1 Years of Data) as of 04/05/13         As of Today 03/22/13 03/22/13 03/22/13 03/22/13     Blood Pressure    . Blood Pressure < 140/90  122/72 110/81 121/86 118/81 116/77      Progress Toward Treatment Goals:  Treatment Goal 04/05/2013  Blood pressure at goal  Stop smoking -    Self Care Goals & Plans:  Self Care Goal 04/05/2013  Manage my medications take my medicines as prescribed; bring my medications to every visit; refill my medications on time  Monitor my health -  Eat healthy foods -  Stop smoking -  Prevent falls -       Care Management & Community Referrals:  Referral 03/01/2013  Referrals made to [facility]

## 2013-04-05 NOTE — Assessment & Plan Note (Addendum)
Discussed with the patient about the results from his SPEP and UPEP which wee basically normal. Patient is in the process of getting in to the hepatitis C clinic for further evaluation and possible treatment for his hepatitis C infection.  Plan. -Awaiting colonoscopy. -Awaiting pelvic MRI. -He will followup after those results.

## 2013-04-05 NOTE — Progress Notes (Signed)
Patient ID: Jacob Joseph, male   DOB: Jan 03, 1956, 57 y.o.   MRN: [id number]   Subjective:   HPI: [name] is a 57 y.o. gentleman with past medical history of hypertension, HIV, depression, among other problems presents to the clinic for followup visit for his diarrhea, and an intentional weight loss.  Patient reports that since our last visit, his diarrhea has significantly improved from frequent episodes per day to just one to 2 episodes per day over the last 1-1/2 weeks. He denies any other intercurrent symptoms, like, bloody stools, abdominal pain, nausea or vomiting. No fevers or chills. He denies dizziness. His appetite has been normal and he reports that he has gained some 7 pounds since last visit. He was unable to have his pelvic and abdominal MRI with contrast for evaluation of the complex left renal cyst performed.  Kindly see the A&P for the status of the pt's chronic medical problems.     Past Medical History  Diagnosis Date  . Hypertension   . HIV (human immunodeficiency virus infection)   . Depression   . Stroke 11/2011    Carotids Doppler negative. Right sided weakness resolved, initially on Plavix for 3 months and then continued with ASA.    . TB lung, latent 1988    Treated  . Calcium oxalate crystals in urine 12/23/2011    Asymptomatic, no hematuria. Advised to take plenty of water.   Current Outpatient Prescriptions  Medication Sig Dispense Refill  . Blood Pressure KIT 1 kit by Does not apply route 2 (two) times daily.  1 each  0  . Darunavir Ethanolate (PREZISTA) 800 MG tablet Take 1 tablet (800 mg total) by mouth daily with breakfast.  30 tablet  11  . elvitegravir-cobicistat-emtricitabine-tenofovir (STRIBILD) 150-150-200-300 MG TABS Take 1 tablet by mouth daily with breakfast.  30 tablet  11  . lisinopril (PRINIVIL) 10 MG tablet Take 1 tablet (10 mg total) by mouth daily.  30 tablet  5  . loperamide (IMODIUM A-D) 2 MG tablet Take 2 mg by mouth 4 (four)  times daily as needed for diarrhea or loose stools.      . megestrol (MEGACE ORAL) 40 MG/ML suspension Take 20 mLs (800 mg total) by mouth daily.  240 mL  5  . metoprolol tartrate (LOPRESSOR) 25 MG tablet Take 1 tablet (25 mg total) by mouth 2 (two) times daily.  60 tablet  11  . mirtazapine (REMERON) 15 MG tablet TAKE ONE TABLET BY MOUTH DAILY AT BEDTIME  30 tablet  5  . Nutritional Supplements (ENSURE HIGH PROTEIN) LIQD Take 1 Can by mouth 2 (two) times daily.  60 Can  0  . peg 3350 powder (MOVIPREP) 100 G SOLR Take 1 kit (200 g total) by mouth once.  1 kit  0  . psyllium (HYDROCIL/METAMUCIL) 95 % PACK Take 1 packet by mouth daily.      . QUEtiapine (SEROQUEL) 100 MG tablet Take 150 mg by mouth at bedtime.      . traMADol-acetaminophen (ULTRACET) 37.5-325 MG per tablet Take 1 tablet by mouth every 6 (six) hours as needed for pain.  120 tablet  0  . vitamin E (VITAMIN E) 400 UNIT capsule Take 1 capsule (400 Units total) by mouth daily.  60 capsule  2  . zolpidem (AMBIEN) 10 MG tablet Take 10 mg by mouth at bedtime as needed. For sleep       No current facility-administered medications for this visit.   Family History  Problem  Relation Age of Onset  . Hypertension Mother   . Hypertension Father   . Cancer Father    History   Social History  . Marital Status: Married    Spouse Name: N/A    Number of Children: N/A  . Years of Education: N/A   Social History Main Topics  . Smoking status: Current Every Day Smoker -- 0.50 packs/day for 41 years    Types: Cigarettes    Last Attempt to Quit: 04/18/2012  . Smokeless tobacco: Never Used     Comment: reports that he calle dteh quitline yesterday  . Alcohol Use: No  . Drug Use: No     Comment: every 2-3 days prior to stroke in May 2013, stopped using July 2013  . Sexual Activity: Yes    Partners: Female     Comment: declined condoms   Other Topics Concern  . None   Social History Narrative   Pt lives with wife and grandkid in  [place].   Has applied for disability and is pending.   Worked in [place] before about 3 years ago.         Review of Systems: Respiratory: Denies SOB, DOE, cough, chest tightness, and wheezing.  Cardiovascular: No chest pain, palpitations and leg swelling.  Genitourinary: No dysuria, frequency, hematuria, or flank pain.   Objective:  Physical Exam: Filed Vitals:   04/05/13 1535  BP: 122/72  Pulse: 99  Temp: 97.4 F (36.3 C)  TempSrc: Oral  Height: 6' (1.829 m)  Weight: 176 lb 1.6 oz (79.878 kg)  SpO2: 100%   General: appears comfortable. No acute distress.  Lungs: CTA bilaterally. Heart: RRR; no extra sounds or murmurs  Abdomen: Non-distended, normal BS, soft, nontender; no hepatosplenomegaly  Extremities: No pedal edema. No joint swelling or tenderness. Neurologic: Alert and oriented x3. No obvious neurologic deficits.  Assessment & Plan:  I have discussed my assessment and plan  with Dr. Kem Kays  as detailed under problem based charting.

## 2013-04-05 NOTE — Assessment & Plan Note (Signed)
Diarrhea significantly improved.

## 2013-04-06 LAB — HIV-1 RNA QUANT-NO REFLEX-BLD
HIV 1 RNA Quant: 24 copies/mL — ABNORMAL HIGH (ref <20)
HIV-1 RNA Quant, Log: 1.38 {Log} — ABNORMAL HIGH (ref <1.30)

## 2013-04-06 NOTE — Progress Notes (Signed)
INTERNAL MEDICINE TEACHING ATTENDING ADDENDUM - Jonah Blue, DO, FACP: I reviewed with the resident Dr. Evangeline Gula  medical history, physical examination, diagnosis and results of tests and treatment and I agree with the patient's care as documented.

## 2013-04-12 ENCOUNTER — Other Ambulatory Visit: Payer: Self-pay | Admitting: *Deleted

## 2013-04-12 DIAGNOSIS — I10 Essential (primary) hypertension: Secondary | ICD-10-CM

## 2013-04-12 MED ORDER — LISINOPRIL 10 MG PO TABS: 10.0000 mg | ORAL_TABLET | Freq: Every day | ORAL | Status: DC

## 2013-04-12 NOTE — Addendum Note (Signed)
Addended by: Filomena Jungling C on: 04/12/2013 12:24 PM   Modules accepted: Orders

## 2013-04-12 NOTE — Addendum Note (Signed)
Addended by: Dow Adolph on: 04/12/2013 12:04 PM   Modules accepted: Orders

## 2013-04-12 NOTE — Addendum Note (Signed)
Addended by: Filomena Jungling C on: 04/12/2013 12:54 PM   Modules accepted: Orders

## 2013-04-13 ENCOUNTER — Ambulatory Visit (HOSPITAL_COMMUNITY): Admission: RE | Admit: 2013-04-13 | Payer: Medicaid Other | Source: Ambulatory Visit

## 2013-04-13 ENCOUNTER — Ambulatory Visit (HOSPITAL_COMMUNITY): Payer: Medicaid Other

## 2013-04-14 ENCOUNTER — Emergency Department (HOSPITAL_COMMUNITY): Payer: Medicaid Other

## 2013-04-14 ENCOUNTER — Encounter (HOSPITAL_COMMUNITY): Payer: Self-pay | Admitting: Emergency Medicine

## 2013-04-14 ENCOUNTER — Emergency Department (HOSPITAL_COMMUNITY)
Admission: EM | Admit: 2013-04-14 | Discharge: 2013-04-14 | Disposition: A | Discharge status: Home / Self Care | Payer: Medicaid Other | Attending: Emergency Medicine | Admitting: Emergency Medicine

## 2013-04-14 DIAGNOSIS — R059 Cough, unspecified: Secondary | ICD-10-CM | POA: Insufficient documentation

## 2013-04-14 DIAGNOSIS — R062 Wheezing: Secondary | ICD-10-CM | POA: Insufficient documentation

## 2013-04-14 DIAGNOSIS — I10 Essential (primary) hypertension: Secondary | ICD-10-CM | POA: Insufficient documentation

## 2013-04-14 DIAGNOSIS — F17200 Nicotine dependence, unspecified, uncomplicated: Secondary | ICD-10-CM | POA: Insufficient documentation

## 2013-04-14 DIAGNOSIS — Z79899 Other long term (current) drug therapy: Secondary | ICD-10-CM | POA: Insufficient documentation

## 2013-04-14 DIAGNOSIS — R05 Cough: Secondary | ICD-10-CM | POA: Insufficient documentation

## 2013-04-14 DIAGNOSIS — F3289 Other specified depressive episodes: Secondary | ICD-10-CM | POA: Insufficient documentation

## 2013-04-14 DIAGNOSIS — Z21 Asymptomatic human immunodeficiency virus [HIV] infection status: Secondary | ICD-10-CM | POA: Insufficient documentation

## 2013-04-14 DIAGNOSIS — M7989 Other specified soft tissue disorders: Secondary | ICD-10-CM

## 2013-04-14 DIAGNOSIS — I82401 Acute embolism and thrombosis of unspecified deep veins of right lower extremity: Secondary | ICD-10-CM

## 2013-04-14 DIAGNOSIS — Z8673 Personal history of transient ischemic attack (TIA), and cerebral infarction without residual deficits: Secondary | ICD-10-CM | POA: Insufficient documentation

## 2013-04-14 DIAGNOSIS — F329 Major depressive disorder, single episode, unspecified: Secondary | ICD-10-CM | POA: Insufficient documentation

## 2013-04-14 DIAGNOSIS — Z8611 Personal history of tuberculosis: Secondary | ICD-10-CM | POA: Insufficient documentation

## 2013-04-14 DIAGNOSIS — I82409 Acute embolism and thrombosis of unspecified deep veins of unspecified lower extremity: Secondary | ICD-10-CM | POA: Insufficient documentation

## 2013-04-14 DIAGNOSIS — M79609 Pain in unspecified limb: Secondary | ICD-10-CM

## 2013-04-14 LAB — POCT I-STAT, CHEM 8
BUN: 16 mg/dL (ref 6–23)
Calcium, Ion: 1.27 mmol/L — ABNORMAL HIGH (ref 1.12–1.23)
Chloride: 108 mEq/L (ref 96–112)
Glucose, Bld: 89 mg/dL (ref 70–99)
Potassium: 4.7 mEq/L (ref 3.5–5.1)
TCO2: 20 mmol/L (ref 0–100)

## 2013-04-14 LAB — APTT: aPTT: 28 seconds (ref 24–37)

## 2013-04-14 LAB — CBC WITH DIFFERENTIAL/PLATELET
Basophils Absolute: 0 10*3/uL (ref 0.0–0.1)
Basophils Relative: 0 % (ref 0–1)
Eosinophils Absolute: 0.2 10*3/uL (ref 0.0–0.7)
MCH: 31.9 pg (ref 26.0–34.0)
MCHC: 36.1 g/dL — ABNORMAL HIGH (ref 30.0–36.0)
Monocytes Absolute: 1 10*3/uL (ref 0.1–1.0)
Neutro Abs: 4.9 10*3/uL (ref 1.7–7.7)
Neutrophils Relative %: 59 % (ref 43–77)
RDW: 14.2 % (ref 11.5–15.5)

## 2013-04-14 LAB — PROTIME-INR: INR: 1.02 (ref 0.00–1.49)

## 2013-04-14 MED ORDER — WARFARIN SODIUM 5 MG PO TABS: 10.0000 mg | ORAL_TABLET | Freq: Every day | ORAL | Status: DC

## 2013-04-14 MED ORDER — ENOXAPARIN (LOVENOX) PATIENT EDUCATION KIT
PACK | Freq: Once | Status: AC
  Administered 2013-04-14: 11:00:00
  Filled 2013-04-14: qty 1

## 2013-04-14 MED ORDER — WARFARIN - PHARMACIST DOSING INPATIENT: Freq: Every day | Status: DC

## 2013-04-14 MED ORDER — WARFARIN SODIUM 10 MG PO TABS
10.0000 mg | ORAL_TABLET | Freq: Once | ORAL | Status: AC
  Administered 2013-04-14: 10 mg via ORAL
  Filled 2013-04-14: qty 1

## 2013-04-14 MED ORDER — ENOXAPARIN SODIUM 100 MG/ML ~~LOC~~ SOLN
1.0000 mg/kg | Freq: Once | SUBCUTANEOUS | Status: AC
  Administered 2013-04-14: 80 mg via SUBCUTANEOUS
  Filled 2013-04-14: qty 1

## 2013-04-14 MED ORDER — OXYCODONE-ACETAMINOPHEN 5-325 MG PO TABS: 1.0000 | ORAL_TABLET | ORAL | Status: DC | PRN

## 2013-04-14 MED ORDER — OXYCODONE-ACETAMINOPHEN 5-325 MG PO TABS
1.0000 | ORAL_TABLET | Freq: Once | ORAL | Status: AC
  Administered 2013-04-14: 1 via ORAL
  Filled 2013-04-14: qty 1

## 2013-04-14 MED ORDER — ENOXAPARIN SODIUM 150 MG/ML ~~LOC~~ SOLN: 1.0000 mg/kg | Freq: Two times a day (BID) | SUBCUTANEOUS | Status: DC

## 2013-04-14 NOTE — ED Provider Notes (Signed)
CSN: [id number]     Arrival date & time 04/14/13  4098 History   First MD Initiated Contact with Patient 04/14/13 [phone]     Chief Complaint  Patient presents with  . Leg Pain   (Consider location/radiation/quality/duration/timing/severity/associated sxs/prior Treatment) HPI Jacob Joseph is a 57 y.o. male who presents to emergency department complaining of right lower leg pain. Patient states he was mowing the lawn 3 days ago and states he may have injured it then. States since then reports swelling, erythema, and tenderness in the calf. Patient denies any numbness or weakness in the leg. She denies prior injury to the leg although he does state that he recalls fracturing his legs but does not remember which one. Patient did not take medications for this pain. Patient denies any recent travel or surgeries. Patient denies any prior blood clots. He denies any fever, chills, malaise. He states he also has had a cough for 2 days. He has not tried any medications for this. States he is a smoker. History of the same.  Past Medical History  Diagnosis Date  . Hypertension   . HIV (human immunodeficiency virus infection)   . Depression   . Stroke 11/2011    Carotids Doppler negative. Right sided weakness resolved, initially on Plavix for 3 months and then continued with ASA.    . TB lung, latent 1988    Treated  . Calcium oxalate crystals in urine 12/23/2011    Asymptomatic, no hematuria. Advised to take plenty of water.   Past Surgical History  Procedure Laterality Date  . Tee without cardioversion  12/07/2011    Procedure: TRANSESOPHAGEAL ECHOCARDIOGRAM (TEE);  Surgeon: [name], MD;  Location: [facility];  Service: Cardiovascular;  Laterality: N/A;  . Inguinal hernia repair  01/16/2012    Procedure: HERNIA REPAIR INGUINAL INCARCERATED;  Surgeon: Liz Malady, MD;  Location: [facility] OR;  Service: General;  Laterality: Right;  . Hernia repair     Family History  Problem Relation Age of Onset   . Hypertension Mother   . Hypertension Father   . Cancer Father    History  Substance Use Topics  . Smoking status: Current Every Day Smoker -- 0.50 packs/day for 41 years    Types: Cigarettes    Last Attempt to Quit: 04/18/2012  . Smokeless tobacco: Never Used     Comment: reports that he calle dteh quitline yesterday  . Alcohol Use: No    Review of Systems  Constitutional: Negative for fever and chills.  HENT: Negative for neck pain and neck stiffness.   Respiratory: Positive for cough and wheezing. Negative for chest tightness and shortness of breath.   Cardiovascular: Positive for leg swelling. Negative for chest pain and palpitations.  Gastrointestinal: Negative for nausea, vomiting, abdominal pain, diarrhea and abdominal distention.  Genitourinary: Negative for dysuria, urgency, frequency and hematuria.  Musculoskeletal: Positive for arthralgias. Negative for back pain.  Skin: Positive for color change. Negative for rash.  Allergic/Immunologic: Negative for immunocompromised state.  Neurological: Negative for dizziness, weakness, light-headedness, numbness and headaches.    Allergies  Statins  Home Medications   Current Outpatient Rx  Name  Route  Sig  Dispense  Refill  . Darunavir Ethanolate (PREZISTA) 800 MG tablet   Oral   Take 1 tablet (800 mg total) by mouth daily with breakfast.   30 tablet   11   . elvitegravir-cobicistat-emtricitabine-tenofovir (STRIBILD) 150-150-200-300 MG TABS   Oral   Take 1 tablet by mouth daily with breakfast.  30 tablet   11   . lisinopril (PRINIVIL) 10 MG tablet   Oral   Take 1 tablet (10 mg total) by mouth daily.   31 tablet   12   . megestrol (MEGACE ORAL) 40 MG/ML suspension   Oral   Take 20 mLs (800 mg total) by mouth daily.   240 mL   5   . metoprolol tartrate (LOPRESSOR) 25 MG tablet   Oral   Take 1 tablet (25 mg total) by mouth 2 (two) times daily.   60 tablet   11   . mirtazapine (REMERON) 15 MG tablet    Oral   Take 15 mg by mouth at bedtime.         [name] Kitchen QUEtiapine (SEROQUEL) 100 MG tablet   Oral   Take 150 mg by mouth at bedtime.         [name] Kitchen zolpidem (AMBIEN) 10 MG tablet   Oral   Take 10 mg by mouth at bedtime as needed. For sleep         . Blood Pressure KIT   Does not apply   1 kit by Does not apply route 2 (two) times daily.   1 each   0     Check BP twice daily.   . Nutritional Supplements (ENSURE HIGH PROTEIN) LIQD   Oral   Take 1 Can by mouth 2 (two) times daily.   60 Can   0   . vitamin E (VITAMIN E) 400 UNIT capsule   Oral   Take 1 capsule (400 Units total) by mouth daily.   60 capsule   2    BP 102/82  Pulse 92  Temp(Src) 98 F (36.7 C) (Oral)  Resp 14  SpO2 99% Physical Exam  Nursing note and vitals reviewed. Constitutional: He appears well-developed and well-nourished. No distress.  HENT:  Head: Normocephalic and atraumatic.  Eyes: Conjunctivae are normal.  Neck: Neck supple.  Cardiovascular: Normal rate, regular rhythm and normal heart sounds.   Pulmonary/Chest: Effort normal. No respiratory distress. He has wheezes. He has no rales.  Abdominal: Soft. Bowel sounds are normal. He exhibits no distension. There is no tenderness. There is no rebound.  Musculoskeletal: He exhibits no edema.  Minimal swelling noted in the right calf and right ankle. Dorsal pedal pulses are normal and equal bilaterally. No tenderness over right foot or ankle. Full range of motion of the ankle joint. Tenderness over the right calf muscle. Positive Homans sign. Normal right knee and thigh. There is an area of erythema over the right calf. Right leg feels warm to the touch.    Neurological: He is alert.  Skin: Skin is warm and dry.    ED Course  Procedures (including critical care time)  ECG interpretation   Date: 04/14/2013  Rate: 90  Rhythm: normal sinus rhythm  QRS Axis: normal  Intervals: normal  ST/T Wave abnormalities: normal  Conduction Disutrbances:  none  Narrative Interpretation:   Old EKG Reviewed: No significant changes noted    Labs Review Labs Reviewed  CBC WITH DIFFERENTIAL - Abnormal; Notable for the following:    MCHC 36.1 (*)    Platelets 147 (*)    All other components within normal limits  POCT I-STAT, CHEM 8 - Abnormal; Notable for the following:    Calcium, Ion 1.27 (*)    All other components within normal limits  PROTIME-INR  APTT   Imaging Review Dg Chest 2 View  04/14/2013   CLINICAL DATA:  Pain  EXAM: CHEST  2 VIEW  COMPARISON:  March 08, 2012  FINDINGS: There is no edema or consolidation. The heart size and pulmonary vascularity are normal. No adenopathy. No bone lesions. No pneumothorax.  IMPRESSION: No edema or consolidation.   Electronically Signed   By: Bretta Bang   On: 04/14/2013 09:01   Dg Tibia/fibula Right  04/14/2013   CLINICAL DATA:  Pain and lower extremity edema  EXAM: RIGHT TIBIA AND FIBULA - 2 VIEW  COMPARISON:  None.  FINDINGS: Frontal and lateral views were obtained. There is no fracture or dislocation. There is no abnormal periosteal reaction. There are scattered foci of arterial vascular calcification.  IMPRESSION: No fracture or dislocation.  No abnormal periosteal reaction.   Electronically Signed   By: Bretta Bang   On: 04/14/2013 09:02   Author: Smiley Houseman, RVT Service: Vascular Lab Author Type: Cardiovascular Sonographer   Filed: 04/14/2013 9:48 AM Note Time: 04/14/2013 9:47 AM         Bilateral lower extremity venous duplex completed. Right: DVT noted in the femoral, popliteal, posterior tibial, and peroneal veins . No evidence of superficial thrombosis. No Baker's cyst. Left: No evidence of DVT, superficial thrombosis, or Baker's cyst.     MDM   1. DVT (deep venous thrombosis), right     PT with right lower leg pain, swelling, unsure of injury. X-rays negative. Pt is having cough, but denies SOB, fever. He is not tachypnec or tachycardic. Venous dopler obtained.  Will monitor.   Venous doppler positive. See above. Lovenox and coumadin started. Will consult internal medicine to make sure they are OK with these medications and for close follow up.    11:24 AM PT seen by internal medicine team. Will start on coumadin and lovenox. They set him up for follow up in 3 days for recheck and INR check.   Pain managed with percocet and crutches provided.   Filed Vitals:   04/14/13 1045 04/14/13 1115 04/14/13 1120 04/14/13 1215  BP: 116/72 111/71 111/71 129/86  Pulse: 88 92 86 106  Temp:      TempSrc:      Resp: 19 19 14 19   Height:      Weight:      SpO2: 98% 100% 100% 100%       Lottie Mussel, PA-C 04/14/13 1542  Deniz Hannan A Chantz Montefusco, PA-C 04/14/13 1553

## 2013-04-14 NOTE — ED Notes (Signed)
Pt c/o right leg pain that is warm and swollen that started Tuesday evening. Pt also c/o lower back pain from fall that occurred Wednesday. Stated to EMS that he was mowing during the day Tuesday and noticed the pain Tuesday evening. BP-118/70 HR-86 Resp-16

## 2013-04-14 NOTE — Progress Notes (Signed)
ANTICOAGULATION CONSULT NOTE - Initial Consult  Pharmacy Consult for Warfarin Indication: New RLE DVT  Allergies  Allergen Reactions  . Statins Other (See Comments)    Elevated liver enzymes.    Patient Measurements: Height: 6' (182.9 cm) Weight: 176 lb 2.4 oz (79.9 kg) IBW/kg (Calculated) : 77.6  Vital Signs: Temp: 98 F (36.7 C) (09/26 0803) Temp src: Oral (09/26 0803) BP: 111/71 mmHg (09/26 1120) Pulse Rate: 86 (09/26 1120)  Labs:  Recent Labs  04/14/13 1015 04/14/13 1030  HGB 15.0 15.3  HCT 41.6 45.0  PLT 147*  --   APTT 28  --   LABPROT 13.2  --   INR 1.02  --   CREATININE  --  1.00    Estimated Creatinine Clearance: 91.6 ml/min (by C-G formula based on Cr of 1).   Medical History: Past Medical History  Diagnosis Date  . Hypertension   . HIV (human immunodeficiency virus infection)   . Depression   . Stroke 11/2011    Carotids Doppler negative. Right sided weakness resolved, initially on Plavix for 3 months and then continued with ASA.    . TB lung, latent 1988    Treated  . Calcium oxalate crystals in urine 12/23/2011    Asymptomatic, no hematuria. Advised to take plenty of water.    Assessment: 57 y.o. M who presented to the [facility] on 04/14/13 with c/o RLE pain. The patient has prior hx CVA in May '13 -- treated with plavix initially then transitioned to ASA for secondary stroke prophylaxis. Dopplers in the ED showed a RLE DVT, no LLE DVT noted. Pharmacy was consulted to start warfarin for VTE treatment.  The patient has one dose of Lovenox 80 mg ordered thus far. Per CHEST guidelines, the patient will need a minimum of 5 days overlap for VTE treatment -- however best practice is to continue until INR >/= 2 for 24 hours. Warfarin points ~9, baseline Hgb/Hct wnl, plts 147. Baseline INR 1.02  The patient is noted to be on a couple of interacting medications PTA. Both darunavir and elvitegravir (a part of stribild) will decrease warfarin concentrations  whereas cobicistat (a part of stribild) will increase warfarin concentrations. He will need to be closely monitored upon discharge.   Goal of Therapy:  INR 2-3   Plan:  1. Warfarin 10 mg x 1 dose -- likely can continue this daily until the next INR check (will need to be checked by Mon, 9/29) 2. The patient will need to be bridged with lovenox for a minimum of 5 days 3. Will plan to educate the patient prior to discharge today.  Georgina Pillion, PharmD, BCPS Clinical Pharmacist Pager: [phone] 04/14/2013 12:11 PM

## 2013-04-14 NOTE — Consult Note (Signed)
Date: 04/14/2013               Patient Name:  Jacob Joseph MRN: [id number]  DOB: 04-Jun-1956 Age / Sex: 57 y.o., male   PCP: Dow Adolph, MD         Requesting Physician: Dr. Loren Racer, MD    Consulting Reason:  outpt follow-up coordination     Chief Complaint: leg pain  History of Present Illness: Jacob Joseph is a 57 yo male with history significant for HIV (04/04/13 CD4 630), degenerative joint disease of bilateral hips, CVA May 2013 managed with Plavix for several months then daily aspirin, chronic kidney disease secondary to hypertension (baseline creatinine .8-1) and complex renal cyst noted on CT Aug 2014.  He presented to [facility] ED with complaints of right calf pain x 3 days noticed after mowing the lawn. Subsequent  Work-up of Lower Extremity Venogram demonstrated femoral, popliteal, posterior tibia, and peroneal veins DVT.  He was initiated on Lovenox and Coumadin. IMTS has been consulted for assistance with outpatient follow-up and further management.   Meds: No current facility-administered medications for this encounter.   Current Outpatient Prescriptions  Medication Sig Dispense Refill  . Darunavir Ethanolate (PREZISTA) 800 MG tablet Take 1 tablet (800 mg total) by mouth daily with breakfast.  30 tablet  11  . elvitegravir-cobicistat-emtricitabine-tenofovir (STRIBILD) 150-150-200-300 MG TABS Take 1 tablet by mouth daily with breakfast.  30 tablet  11  . lisinopril (PRINIVIL) 10 MG tablet Take 1 tablet (10 mg total) by mouth daily.  31 tablet  12  . megestrol (MEGACE ORAL) 40 MG/ML suspension Take 20 mLs (800 mg total) by mouth daily.  240 mL  5  . metoprolol tartrate (LOPRESSOR) 25 MG tablet Take 1 tablet (25 mg total) by mouth 2 (two) times daily.  60 tablet  11  . mirtazapine (REMERON) 15 MG tablet Take 15 mg by mouth at bedtime.      [name] Kitchen QUEtiapine (SEROQUEL) 100 MG tablet Take 150 mg by mouth at bedtime.      [name] Kitchen zolpidem (AMBIEN) 10 MG tablet Take 10 mg by mouth at  bedtime as needed. For sleep      . Blood Pressure KIT 1 kit by Does not apply route 2 (two) times daily.  1 each  0  . Nutritional Supplements (ENSURE HIGH PROTEIN) LIQD Take 1 Can by mouth 2 (two) times daily.  60 Can  0  . vitamin E (VITAMIN E) 400 UNIT capsule Take 1 capsule (400 Units total) by mouth daily.  60 capsule  2    Allergies: Allergies as of 04/14/2013 - Review Complete 04/14/2013  Allergen Reaction Noted  . Statins Other (See Comments) 03/14/2012   Past Medical History  Diagnosis Date  . Hypertension   . HIV (human immunodeficiency virus infection)   . Depression   . Stroke 11/2011    Carotids Doppler negative. Right sided weakness resolved, initially on Plavix for 3 months and then continued with ASA.    . TB lung, latent 1988    Treated  . Calcium oxalate crystals in urine 12/23/2011    Asymptomatic, no hematuria. Advised to take plenty of water.   Past Surgical History  Procedure Laterality Date  . Tee without cardioversion  12/07/2011    Procedure: TRANSESOPHAGEAL ECHOCARDIOGRAM (TEE);  Surgeon: [name], MD;  Location: [facility] ENDOSCOPY;  Service: Cardiovascular;  Laterality: N/A;  . Inguinal hernia repair  01/16/2012    Procedure: HERNIA REPAIR INGUINAL INCARCERATED;  Surgeon: Gabrielle Dare  Janee Morn, MD;  Location: [facility] OR;  Service: General;  Laterality: Right;  . Hernia repair     Family History  Problem Relation Age of Onset  . Hypertension Mother   . Hypertension Father   . Cancer Father    History   Social History  . Marital Status: Married    Spouse Name: N/A    Number of Children: N/A  . Years of Education: N/A   Occupational History  . Not on file.   Social History Main Topics  . Smoking status: Current Every Day Smoker -- 0.50 packs/day for 41 years    Types: Cigarettes    Last Attempt to Quit: 04/18/2012  . Smokeless tobacco: Never Used     Comment: reports that he calle dteh quitline yesterday  . Alcohol Use: No  . Drug Use: No     Comment:  every 2-3 days prior to stroke in May 2013, stopped using July 2013  . Sexual Activity: Yes    Partners: Female     Comment: declined condoms   Other Topics Concern  . Not on file   Social History Narrative   Pt lives with wife and grandkid in [place].   Has applied for disability and is pending.   Worked in [employer] before about 3 years ago.           Review of Systems: Constitutional:  Denies fever, chills, diaphoresis, appetite change and fatigue.  HEENT: Denies acute visual changes, eye pain.  Respiratory: Denies SOB, DOE, cough, chest tightness, and wheezing.  Cardiovascular: Endorses right leg swelling, Denies chest pain, palpitations  Gastrointestinal: Denies nausea, vomiting, abdominal pain, diarrhea, constipation, blood in stool  Genitourinary: Denies dysuria, hematuria  Musculoskeletal: Endorses right calf tenderness  Skin: Denies rash  Neurological: Denies dizziness, syncope, weakness, light-headedness, numbness and headaches.   Hematological: Denies easy bruising, personal or family bleeding history.     Physical Exam: Blood pressure 116/72, pulse 88, temperature 98 F (36.7 C), temperature source Oral, resp. rate 19, height 6' (1.829 m), weight 176 lb 2.4 oz (79.9 kg), SpO2 98.00%. General: Well-developed, well-nourished, AA male  in no acute distress;wife at bedside HEENT: Normocephalic, atraumatic, PERRLA, EOMI, murkly sclera, Moist mucous membranes neck supple Lungs: Normal respiratory effort. Clear to auscultation bilaterally from apices to bases without crackles or wheezes appreciated. Heart: normal rate, regular rhythm, normal S1 and S2, no gallop, murmur, or rubs appreciated. Abdomen: BS normoactive. Soft, Nondistended, non-tender. No masses or organomegaly appreciated. Extremities: distal pulses intact, TTP right calf, no tender thigh or femoral region, no palpable cords appreciated, no erythema, minimal right calf swelling appreciated, full passive ROM  of BLE , active RLE ROM limited secondary to discomfort in calf area Neurologic: grossly non-focal, alert and oriented x3, appropriate and cooperative throughout examination.   Lab results: Basic Metabolic Panel:  Recent Labs  16/10/96 1030  NA 137  K 4.7  CL 108  GLUCOSE 89  BUN 16  CREATININE 1.00   CBC:  Recent Labs  04/14/13 1015 04/14/13 1030  WBC 8.3  --   NEUTROABS 4.9  --   HGB 15.0 15.3  HCT 41.6 45.0  MCV 88.5  --   PLT 147*  --    Urine Drug Screen: Drugs of Abuse     Component Value Date/Time   LABOPIA NEG 05/30/2012 1056   LABOPIA NONE DETECTED 12/03/2011 1638   COCAINSCRNUR PPS 05/30/2012 1056   COCAINSCRNUR POSITIVE* 12/03/2011 1638   LABBENZ NEG 05/30/2012 1056  LABBENZ NEGATIVE 03/09/2012 0741   LABBENZ NONE DETECTED 12/03/2011 1638   AMPHETMU NEGATIVE 03/09/2012 0741   AMPHETMU NONE DETECTED 12/03/2011 1638   THCU NONE DETECTED 12/03/2011 1638   LABBARB NEG 05/30/2012 1056   LABBARB NONE DETECTED 12/03/2011 1638    Alcohol Level: No results found for this basename: ETH,  in the last 72 hours Urinalysis: No results found for this basename: COLORURINE, APPERANCEUR, LABSPEC, PHURINE, GLUCOSEU, HGBUR, BILIRUBINUR, KETONESUR, PROTEINUR, UROBILINOGEN, NITRITE, LEUKOCYTESUR,  in the last 72 hours   Imaging results:  Dg Chest 2 View  04/14/2013   CLINICAL DATA:  Pain  EXAM: CHEST  2 VIEW  COMPARISON:  March 08, 2012  FINDINGS: There is no edema or consolidation. The heart size and pulmonary vascularity are normal. No adenopathy. No bone lesions. No pneumothorax.  IMPRESSION: No edema or consolidation.   Electronically Signed   By: Bretta Bang   On: 04/14/2013 09:01   Dg Tibia/fibula Right  04/14/2013   CLINICAL DATA:  Pain and lower extremity edema  EXAM: RIGHT TIBIA AND FIBULA - 2 VIEW  COMPARISON:  None.  FINDINGS: Frontal and lateral views were obtained. There is no fracture or dislocation. There is no abnormal periosteal reaction. There are  scattered foci of arterial vascular calcification.  IMPRESSION: No fracture or dislocation.  No abnormal periosteal reaction.   Electronically Signed   By: Bretta Bang   On: 04/14/2013 09:02    Assessment, Plan, & Recommendations by Problem:   1. Lower Extremity Deep Vein Thrombosis: No known provoking factors at this point.  Pt denies recent immobilization, travel, trauma or surgery.  He is not obese and has no acute infections, known cancer or prior VTE. He is HIV positive but without notable propensity for thrombocytosis (platelets 147 today).  Given the above he will may require indefinite anticoagulation. He has received 1st dose of Lovenox with overlapping Coumadin.  Follow-up has been arranged for Monday 9/29 in the Internal Medicine Outpt Clinic with pharmacist and MD for post-ED follow-up.  At that point pt will be Day #4 of overlapping therapy and should have INR checked with Coumadin dosage adjusted as needed.  He will keep his appoint with [facility] Tuesday 9/30 (Dr. Orvan Falconer)   Signed: Manuela Schwartz, MD 04/14/2013, 11:00 AM

## 2013-04-14 NOTE — Progress Notes (Signed)
Bilateral lower extremity venous duplex completed.  Right:  DVT noted in the femoral, popliteal, posterior tibial, and peroneal veins.  No evidence of superficial thrombosis.  No Baker's cyst.  Left:  No evidence of DVT, superficial thrombosis, or Baker's cyst.   

## 2013-04-16 NOTE — ED Provider Notes (Signed)
Medical screening examination/treatment/procedure(s) were performed by non-physician practitioner and as supervising physician I was immediately available for consultation/collaboration.   Zeyad Delaguila, MD 04/16/13 1504 

## 2013-04-17 ENCOUNTER — Ambulatory Visit (INDEPENDENT_AMBULATORY_CARE_PROVIDER_SITE_OTHER): Payer: Medicaid Other | Admitting: Pharmacist

## 2013-04-17 DIAGNOSIS — Z7901 Long term (current) use of anticoagulants: Secondary | ICD-10-CM

## 2013-04-17 DIAGNOSIS — Z8673 Personal history of transient ischemic attack (TIA), and cerebral infarction without residual deficits: Secondary | ICD-10-CM

## 2013-04-17 DIAGNOSIS — I82409 Acute embolism and thrombosis of unspecified deep veins of unspecified lower extremity: Secondary | ICD-10-CM

## 2013-04-17 LAB — CBC WITH DIFFERENTIAL/PLATELET
Basophils Absolute: 0 10*3/uL (ref 0.0–0.1)
Basophils Relative: 0 % (ref 0–1)
Eosinophils Absolute: 0.3 10*3/uL (ref 0.0–0.7)
Eosinophils Relative: 3 % (ref 0–5)
HCT: 37.9 % — ABNORMAL LOW (ref 39.0–52.0)
Lymphocytes Relative: 27 % (ref 12–46)
MCHC: 35.4 g/dL (ref 30.0–36.0)
MCV: 88.1 fL (ref 78.0–100.0)
Monocytes Absolute: 0.8 10*3/uL (ref 0.1–1.0)
Platelets: 212 10*3/uL (ref 150–400)
RDW: 14.4 % (ref 11.5–15.5)
WBC: 8.6 10*3/uL (ref 4.0–10.5)

## 2013-04-17 LAB — HEPARIN ANTI-XA: Heparin LMW: 0.66 IU/mL

## 2013-04-17 NOTE — Patient Instructions (Signed)
Patient instructed to take medications as defined in the Anti-coagulation Track section of this encounter.  Patient instructed to take today's dose.  Patient verbalized understanding of these instructions.    

## 2013-04-17 NOTE — Consult Note (Signed)
I did not see Mr. Trivedi personally, but reviewed the case and Consult Note above with Dr. Bosie Clos.

## 2013-04-17 NOTE — Progress Notes (Signed)
Anti-Coagulation Progress Note  Jacob Joseph is a 57 y.o. male who is currently on an anti-coagulation regimen.    RECENT RESULTS: Recent results are below, the most recent result is correlated with a dose of 42.5mg  over 5 days.  Lab Results  Component Value Date   INR 4.19 04/17/2013   INR 4.19* 04/17/2013   INR 1.02 04/14/2013    ANTI-COAG DOSE: Anticoagulation Dose Instructions as of 04/17/2013     [name] Tue Wed Thu Fri Sat   New Dose 7.5 mg 0 mg 0 mg 10 mg 7.5 mg 7.5 mg 10 mg       ANTICOAG SUMMARY: Anticoagulation Episode Summary   Current INR goal 2.0-3.0  Next INR check 04/24/2013  INR from last check 4.19! (04/17/2013)  Weekly max dose   Target end date Indefinite  [facility]  Preferred lab   Send INR reminders to    Indications  History of CVA [V12.54] Long term (current) use of anticoagulants [V58.61]        Comments         ANTICOAG TODAY: Anticoagulation Summary as of 04/17/2013   INR goal 2.0-3.0  Selected INR 4.19! (04/17/2013)  Next INR check 04/24/2013  Target end date Indefinite   Indications  History of CVA [V12.54] Long term (current) use of anticoagulants [V58.61]      Anticoagulation Episode Summary   [facility]   Preferred lab    Send INR reminders to    Comments       PATIENT INSTRUCTIONS: Patient Instructions  Patient instructed to take medications as defined in the Anti-coagulation Track section of this encounter.  Patient instructed to take today's dose.  Patient verbalized understanding of these instructions.       FOLLOW-UP Return in about 7 days (around 04/24/2013) for Follow up INR at 1100h.  Hulen Luster, III Pharm.D., CACP

## 2013-04-18 ENCOUNTER — Other Ambulatory Visit (INDEPENDENT_AMBULATORY_CARE_PROVIDER_SITE_OTHER): Payer: Medicaid Other | Admitting: *Deleted

## 2013-04-18 ENCOUNTER — Ambulatory Visit (INDEPENDENT_AMBULATORY_CARE_PROVIDER_SITE_OTHER): Payer: Medicaid Other | Admitting: Internal Medicine

## 2013-04-18 ENCOUNTER — Encounter: Payer: Self-pay | Admitting: Internal Medicine

## 2013-04-18 ENCOUNTER — Ambulatory Visit: Payer: Medicaid Other | Admitting: *Deleted

## 2013-04-18 VITALS — BP 110/75 | HR 88 | Temp 97.3°F | Ht 72.0 in | Wt 173.2 lb

## 2013-04-18 VITALS — BP 105/75 | HR 78 | Temp 97.9°F | Ht 72.0 in | Wt 172.0 lb

## 2013-04-18 DIAGNOSIS — R634 Abnormal weight loss: Secondary | ICD-10-CM

## 2013-04-18 DIAGNOSIS — I82409 Acute embolism and thrombosis of unspecified deep veins of unspecified lower extremity: Secondary | ICD-10-CM

## 2013-04-18 DIAGNOSIS — I82401 Acute embolism and thrombosis of unspecified deep veins of right lower extremity: Secondary | ICD-10-CM

## 2013-04-18 DIAGNOSIS — Z23 Encounter for immunization: Secondary | ICD-10-CM

## 2013-04-18 DIAGNOSIS — B20 Human immunodeficiency virus [HIV] disease: Secondary | ICD-10-CM

## 2013-04-18 DIAGNOSIS — F17200 Nicotine dependence, unspecified, uncomplicated: Secondary | ICD-10-CM

## 2013-04-18 DIAGNOSIS — F1410 Cocaine abuse, uncomplicated: Secondary | ICD-10-CM

## 2013-04-18 MED ORDER — ENSURE HIGH PROTEIN PO LIQD: 1.0000 | Freq: Two times a day (BID) | ORAL | Status: DC

## 2013-04-18 MED ORDER — ENSURE COMPLETE PO LIQD: 237.0000 mL | Freq: Two times a day (BID) | ORAL | Status: DC

## 2013-04-18 NOTE — Assessment & Plan Note (Signed)
Patient mentioned wanting to start Chantix for smoking cessation. Discuss with him at next appointment.

## 2013-04-18 NOTE — Progress Notes (Signed)
Patient ID: Jacob Joseph, male   DOB: Dec 12, 1955, 57 y.o.   MRN: [id number] Morrill reports that he is generally doing well, still substance free, but recovering from a blood clot in his leg that was treated last week. We continued discussion of his stepwork with his sponsor and Humphrey's continuing use of social/community supports to bolster his recovery. Hamzah denies any close calls with cocaine and assessed himself to have maintained a comfortable focus on his recovery program. We processed his mood and its role in ongoing sobriety. [name] shared that he and his wife occasionally have communications issues but they are able to resolve them acceptably well. Counselor and patient used this scenario to address the function of step 4 and working to identify and improve "character defects" to aid in long term sobriety. Patient generally responds well to counselor interventions and presently displays clear commitment to avoiding drug use. He has missed recent support group meetings due to his leg injury, but reports talking with his NA sponsor on the phone everyday and very much valuing that relationship. [name] continues to show good overall progress.  Xzavien Harada, CSAC [facility]

## 2013-04-18 NOTE — Progress Notes (Signed)
Patient ID: Jacob Joseph, male   DOB: [birth year], 57 y.o.   MRN: [id number]   Subjective:   Patient ID: Jacob Joseph male   DOB: [birth year] 57 y.o.   MRN: [id number]  HPI: [name] is a 58 y.o. M w/ PMHx of HTN, HIV (follows w/ Dr. Orvan Joseph; last CD4 630, VL 24, on 04/04/13), CVA (2013), and recent RLE DVT, presents to the clinic for ED follow up after DVT diagnosis.   Patient was seen in the ED on 04/14/13 for leg pain and swelling. LE Doppler revealed RLE DVT involving the right femoral, popliteal, posterior tibial, and peroneal veins. At that time, the patient was started on Lovenox and Coumadin and told to follow up with [facility] clinic and Dr. Alexandria Joseph. The patient saw Dr. Alexandria Joseph yesterday for Coumadin therapy and was shown to have an INR of 4.19 and his anti-coagulation was adjusted appropriately.   Today, the patient presents to the [facility] with continued pain and slightly worsening swelling since his ED visit. He says the pain has somewhat limited his ability to walk and he now uses a walker to aid with ambulation. On exam, the leg is swollen and tender to the touch, Pratt's and Homans' signs +ve. Patient and wife have expressed concern that the leg has not improved significantly since he was seen in the ED. Pulses are symmetric and present bilaterally. No obvious signs of compartment syndrome.  Otherwise, the patient has no further issues. He was also seen at [facility] prior to his [facility] visit and was told his HIV remains under very good control and to return there in 6 months.  Past Medical History  Diagnosis Date  . Hypertension   . HIV (human immunodeficiency virus infection)   . Depression   . Stroke 11/2011    Carotids Doppler negative. Right sided weakness resolved, initially on Plavix for 3 months and then continued with ASA.    . TB lung, latent 1988    Treated  . Calcium oxalate crystals in urine 12/23/2011    Asymptomatic, no hematuria. Advised to take plenty of water.   Current  Outpatient Prescriptions  Medication Sig Dispense Refill  . Blood Pressure KIT 1 kit by Does not apply route 2 (two) times daily.  1 each  0  . Darunavir Ethanolate (PREZISTA) 800 MG tablet Take 1 tablet (800 mg total) by mouth daily with breakfast.  30 tablet  11  . elvitegravir-cobicistat-emtricitabine-tenofovir (STRIBILD) 150-150-200-300 MG TABS Take 1 tablet by mouth daily with breakfast.  30 tablet  11  . enoxaparin (LOVENOX) 150 MG/ML injection Inject 0.53 mLs (80 mg total) into the skin every 12 (twelve) hours.  14 Syringe  0  . lisinopril (PRINIVIL) 10 MG tablet Take 1 tablet (10 mg total) by mouth daily.  31 tablet  12  . megestrol (MEGACE ORAL) 40 MG/ML suspension Take 20 mLs (800 mg total) by mouth daily.  240 mL  5  . metoprolol tartrate (LOPRESSOR) 25 MG tablet Take 1 tablet (25 mg total) by mouth 2 (two) times daily.  60 tablet  11  . mirtazapine (REMERON) 15 MG tablet Take 15 mg by mouth at bedtime.      . Nutritional Supplements (ENSURE HIGH PROTEIN) LIQD Take 1 Can by mouth 2 (two) times daily.  60 Can  11  . oxyCODONE-acetaminophen (PERCOCET) 5-325 MG per tablet Take 1 tablet by mouth every 4 (four) hours as needed for pain.  20 tablet  0  . QUEtiapine (SEROQUEL) 100 MG tablet  Take 150 mg by mouth at bedtime.      . vitamin E (VITAMIN E) 400 UNIT capsule Take 1 capsule (400 Units total) by mouth daily.  60 capsule  2  . warfarin (COUMADIN) 5 MG tablet Take 2 tablets (10 mg total) by mouth daily.  30 tablet  0  . zolpidem (AMBIEN) 10 MG tablet Take 10 mg by mouth at bedtime as needed. For sleep       No current facility-administered medications for this visit.   Family History  Problem Relation Age of Onset  . Hypertension Mother   . Hypertension Father   . Cancer Father    History   Social History  . Marital Status: Married    Spouse Name: N/A    Number of Children: N/A  . Years of Education: N/A   Social History Main Topics  . Smoking status: Current Every Day  Smoker -- 0.50 packs/day for 41 years    Types: Cigarettes    Last Attempt to Quit: 04/18/2012  . Smokeless tobacco: Never Used     Comment: wants something to stop all together  . Alcohol Use: No  . Drug Use: No     Comment: every 2-3 days prior to stroke in May 2013, stopped using July 2013  . Sexual Activity: Yes    Partners: Female     Comment: declined condoms   Other Topics Concern  . None   Social History Narrative   Pt lives with wife and grandkid in [place].   Has applied for disability and is pending.   Worked in [employer] before about 3 years ago.         Review of Systems: General: Denies fever, chills, diaphoresis, appetite change and fatigue.  Respiratory: Denies SOB, DOE, cough, chest tightness, and wheezing.   Cardiovascular: Denies chest pain, palpitations and leg swelling.  Gastrointestinal: Denies nausea, vomiting, abdominal pain, diarrhea, constipation, blood in stool and abdominal distention.  Genitourinary: Denies dysuria, urgency, frequency, hematuria, flank pain and difficulty urinating.  Endocrine: Denies hot or cold intolerance, sweats, polyuria, polydipsia. Musculoskeletal: Positive for RLE pain and swelling. Denies back pain, joint swelling, arthralgias and gait problem.  Skin: Denies pallor, rash and wounds.  Neurological: Denies dizziness, seizures, syncope, weakness, lightheadedness, numbness and headaches.  Psychiatric/Behavioral: Denies mood changes, confusion, nervousness, sleep disturbance and agitation.  Objective:  Physical Exam: Filed Vitals:   04/18/13 1413  BP: 110/75  Pulse: 88  Temp: 97.3 F (36.3 C)  TempSrc: Oral  Height: 6' (1.829 m)  Weight: 173 lb 3.2 oz (78.563 kg)  SpO2: 97%   General: Vital signs reviewed.  Patient is a well-developed and well-nourished, in no acute distress and cooperative with exam. Alert and oriented x3.  Head: Normocephalic and atraumatic. Eyes: PERRL, EOMI, conjunctivae normal, No scleral  icterus.  Neck: Supple, trachea midline, normal ROM, No JVD, masses, thyromegaly, or carotid bruit present.  Cardiovascular: RRR, S1 normal, S2 normal, no murmurs, gallops, or rubs. Pulmonary/Chest: Normal respiratory effort, CTAB, no wheezes, rales, or rhonchi. Abdominal: Soft, non-tender, non-distended, bowel sounds are normal, no masses, organomegaly, or guarding present.  Musculoskeletal: No joint deformities, erythema, or stiffness, ROM full and no nontender. Extremities: Positive for +2 pitting edema in the RLE. Pratt's/Homan's +ve. Increased temperature to touch, w/ tenderness to palpation of calf extending to popliteal region. Pulses symmetric and intact bilaterally. No cyanosis or clubbing. Neurological: A&O x3, Strength is normal and symmetric bilaterally, cranial nerve II-XII are grossly intact, no focal motor deficit,  sensory intact to light touch bilaterally.  Skin: Warm, dry and intact. No rashes or erythema. Psychiatric: Normal mood and affect. speech and behavior is normal. Cognition and memory are normal.   Assessment & Plan:   Please see problem-based assessment and plan.

## 2013-04-18 NOTE — Patient Instructions (Signed)
1. Please schedule a follow up appointment for next week.  2. Please take all medications as prescribed.   3. If you have worsening of your symptoms or new symptoms arise, please call the clinic ([phone]), or go to the ER immediately if symptoms are severe.  You have done a great job in taking all your medications. I appreciate it very much. Please continue doing that.

## 2013-04-18 NOTE — Assessment & Plan Note (Signed)
Patient presented to ED on 04/14/13 with RLE pain and swelling, found to be a DVT. LE Doppler revealed DVT involving the right femoral, popliteal, posterior tibial, and peroneal veins. Started on Lovenox and Coumadin at that time. -Follows with Dr Alexandria Lodge in Anti-coag clinic. Yesterday, INR 4.91. Adjustments made to Coumadin by Dr. Alexandria Lodge. To follow up on Tuesday 04/25/13. -Patient still complaining of pain and wife claims the swelling has worsened somewhat since his ED visit. Most likely this is d/t large DVT, rather than complications such as bleeding, infection, etc. Patient is afebrile, pulses present in LE and no signs of infection or compartment syndrome. Discussed case with Dr. Dalphine Handing, Dr. Aundria Rud, and Dr. Josem Kaufmann, all feel that this is simply d/t DVT. Will have patient follow up next week to determine if leg symptoms are improving. Instructed patient to keep leg elevated at night in order to allow for proper venous drainage.

## 2013-04-18 NOTE — Progress Notes (Signed)
Patient ID: Jacob Joseph, male   DOB: Mar 17, 1956, 57 y.o.   MRN: [id number]          [facility]  Patient Active Problem List   Diagnosis Date Noted  . HIV INFECTION 04/27/2006    Priority: High  . Polysubstance abuse 04/27/2006    Priority: High  . Long term (current) use of anticoagulants 04/17/2013  . Complex Lt Renal Cyst 03/22/2013  . Diarrhea 03/22/2013  . Unintentional weight loss 01/03/2013  . Preventive measure 05/30/2012  . History of CVA 03/08/2012  . Normocytic anemia 03/08/2012  . Leukopenia 03/08/2012  . CKD Stage 2 03/08/2012  . ERECTILE DYSFUNCTION 04/25/2008  . INSOMNIA, CHRONIC 06/14/2007  . HEPATITIS C 04/27/2006  . TOBACCO USER 04/27/2006  . DEPRESSION 04/27/2006  . HYPERTENSION 04/27/2006  . DEGENERATIVE JOINT DISEASE 04/27/2006  . Chronic Low Back Pain 04/27/2006    Patient's Medications  New Prescriptions   No medications on file  Previous Medications   BLOOD PRESSURE KIT    1 kit by Does not apply route 2 (two) times daily.   DARUNAVIR ETHANOLATE (PREZISTA) 800 MG TABLET    Take 1 tablet (800 mg total) by mouth daily with breakfast.   ELVITEGRAVIR-COBICISTAT-EMTRICITABINE-TENOFOVIR (STRIBILD) 150-150-200-300 MG TABS    Take 1 tablet by mouth daily with breakfast.   ENOXAPARIN (LOVENOX) 150 MG/ML INJECTION    Inject 0.53 mLs (80 mg total) into the skin every 12 (twelve) hours.   LISINOPRIL (PRINIVIL) 10 MG TABLET    Take 1 tablet (10 mg total) by mouth daily.   MEGESTROL (MEGACE ORAL) 40 MG/ML SUSPENSION    Take 20 mLs (800 mg total) by mouth daily.   METOPROLOL TARTRATE (LOPRESSOR) 25 MG TABLET    Take 1 tablet (25 mg total) by mouth 2 (two) times daily.   MIRTAZAPINE (REMERON) 15 MG TABLET    Take 15 mg by mouth at bedtime.   NUTRITIONAL SUPPLEMENTS (ENSURE HIGH PROTEIN) LIQD    Take 1 Can by mouth 2 (two) times daily.   OXYCODONE-ACETAMINOPHEN (PERCOCET) 5-325 MG PER TABLET    Take 1 tablet by mouth every 4 (four)  hours as needed for pain.   QUETIAPINE (SEROQUEL) 100 MG TABLET    Take 150 mg by mouth at bedtime.   VITAMIN E (VITAMIN E) 400 UNIT CAPSULE    Take 1 capsule (400 Units total) by mouth daily.   WARFARIN (COUMADIN) 5 MG TABLET    Take 2 tablets (10 mg total) by mouth daily.   ZOLPIDEM (AMBIEN) 10 MG TABLET    Take 10 mg by mouth at bedtime as needed. For sleep  Modified Medications   No medications on file  Discontinued Medications   No medications on file    Subjective: Everest is in for his routine visit. He has not missed any doses of his HIV medicines since his last visit. Review of Systems: Pertinent items are noted in HPI.  Past Medical History  Diagnosis Date  . Hypertension   . HIV (human immunodeficiency virus infection)   . Depression   . Stroke 11/2011    Carotids Doppler negative. Right sided weakness resolved, initially on Plavix for 3 months and then continued with ASA.    . TB lung, latent 1988    Treated  . Calcium oxalate crystals in urine 12/23/2011    Asymptomatic, no hematuria. Advised to take plenty of water.    History  Substance Use Topics  . Smoking status: Current Every Day  Smoker -- 0.50 packs/day for 41 years    Types: Cigarettes    Last Attempt to Quit: 04/18/2012  . Smokeless tobacco: Never Used     Comment: wants something to stop all together  . Alcohol Use: No    Family History  Problem Relation Age of Onset  . Hypertension Mother   . Hypertension Father   . Cancer Father     Allergies  Allergen Reactions  . Statins Other (See Comments)    Elevated liver enzymes.    Objective: Temp: 97.9 F (36.6 C) (09/30 1025) Temp src: Oral (09/30 1025) BP: 105/75 mmHg (09/30 1025) Pulse Rate: 78 (09/30 1025)  General: He is in no distress Oral: No oropharyngeal lesions Skin: No rash Lungs: Clear Cor: Regular S1 and S2 no murmurs Mood and affect: Flat  Lab Results HIV 1 RNA Quant (copies/mL)  Date Value  04/04/2013 24*  12/20/2012 <20     09/07/2012 <20      CD4 T Cell Abs (/uL)  Date Value  04/04/2013 630   12/20/2012 420   09/07/2012 470      Assessment: His HIV infection remains under very good control.  Plan: 1. Continue current antiretroviral regimen 2. Followup after lab work in 6 months   Cliffton Asters, MD [facility] [phone] pager   [phone] cell 04/18/2013, 11:06 AM

## 2013-04-21 ENCOUNTER — Ambulatory Visit (HOSPITAL_COMMUNITY)
Admission: RE | Admit: 2013-04-21 | Discharge: 2013-04-21 | Disposition: A | Discharge status: Home / Self Care | Payer: Medicaid Other | Source: Ambulatory Visit | Attending: Internal Medicine | Admitting: Internal Medicine

## 2013-04-21 DIAGNOSIS — N281 Cyst of kidney, acquired: Secondary | ICD-10-CM

## 2013-04-21 DIAGNOSIS — Q619 Cystic kidney disease, unspecified: Secondary | ICD-10-CM | POA: Insufficient documentation

## 2013-04-21 MED ORDER — GADOBENATE DIMEGLUMINE 529 MG/ML IV SOLN
15.0000 mL | Freq: Once | INTRAVENOUS | Status: AC | PRN
  Administered 2013-04-21: 15 mL via INTRAVENOUS

## 2013-04-21 NOTE — Progress Notes (Signed)
I saw and evaluated the patient.  I personally confirmed the key portions of the history and exam documented by Dr. Jones and I reviewed pertinent patient test results.  The assessment, diagnosis, and plan were formulated together and I agree with the documentation in the resident's note.   

## 2013-04-24 ENCOUNTER — Encounter: Payer: Self-pay | Admitting: Internal Medicine

## 2013-04-24 ENCOUNTER — Ambulatory Visit (INDEPENDENT_AMBULATORY_CARE_PROVIDER_SITE_OTHER): Payer: Medicaid Other | Admitting: Pharmacist

## 2013-04-24 ENCOUNTER — Ambulatory Visit (INDEPENDENT_AMBULATORY_CARE_PROVIDER_SITE_OTHER): Payer: Medicaid Other | Admitting: Internal Medicine

## 2013-04-24 VITALS — BP 109/76 | HR 92 | Temp 97.0°F | Ht 72.0 in | Wt 173.7 lb

## 2013-04-24 DIAGNOSIS — Q618 Other cystic kidney diseases: Secondary | ICD-10-CM

## 2013-04-24 DIAGNOSIS — Z7901 Long term (current) use of anticoagulants: Secondary | ICD-10-CM

## 2013-04-24 DIAGNOSIS — R634 Abnormal weight loss: Secondary | ICD-10-CM

## 2013-04-24 DIAGNOSIS — Z8673 Personal history of transient ischemic attack (TIA), and cerebral infarction without residual deficits: Secondary | ICD-10-CM

## 2013-04-24 DIAGNOSIS — I82409 Acute embolism and thrombosis of unspecified deep veins of unspecified lower extremity: Secondary | ICD-10-CM

## 2013-04-24 DIAGNOSIS — N281 Cyst of kidney, acquired: Secondary | ICD-10-CM

## 2013-04-24 DIAGNOSIS — F17200 Nicotine dependence, unspecified, uncomplicated: Secondary | ICD-10-CM

## 2013-04-24 DIAGNOSIS — I82401 Acute embolism and thrombosis of unspecified deep veins of right lower extremity: Secondary | ICD-10-CM

## 2013-04-24 MED ORDER — OXYCODONE-ACETAMINOPHEN 5-325 MG PO TABS: 1.0000 | ORAL_TABLET | ORAL | Status: DC | PRN

## 2013-04-24 NOTE — Assessment & Plan Note (Addendum)
Rt Leg swelling is improving but still moderately tender to palpation. No signs of infection or inflammation.   Plan  - percocet for another 2 weeks - cont with leg elevation   - will follow up with Dr Alexandria Lodge today and continue with coumadin - in case of fevers, shortness of breath, chest pain, or increased pain in the right lower extremity instructed him to call clinic or present to the ED.

## 2013-04-24 NOTE — Patient Instructions (Addendum)
Please take your medication as before  Please keep appointment with Dr Alexandria Lodge. I will call you with regarding smoking cessation Please follow up in one month.

## 2013-04-24 NOTE — Patient Instructions (Signed)
Patient instructed to take medications as defined in the Anti-coagulation Track section of this encounter.  Patient instructed to OMIT today's dose.  Patient verbalized understanding of these instructions.    

## 2013-04-24 NOTE — Progress Notes (Signed)
Anti-Coagulation Progress Note  Jacob Joseph is a 57 y.o. male who is currently on an anti-coagulation regimen.    RECENT RESULTS: Recent results are below, the most recent result is correlated with a dose of 42.5 mg. per week: Reducing to 37.5mg /wk with one tablet to be omitted/HELD today.  Lab Results  Component Value Date   INR 3.50 04/24/2013   INR 4.19 04/17/2013   INR 4.19* 04/17/2013    ANTI-COAG DOSE: Anticoagulation Dose Instructions as of 04/24/2013     [name] Tue Wed Thu Fri Sat   New Dose 7.5 mg 0 mg 5 mg 7.5 mg 5 mg 7.5 mg 5 mg       ANTICOAG SUMMARY: Anticoagulation Episode Summary   Current INR goal 2.0-3.0  Next INR check 05/01/2013  INR from last check 3.50! (04/24/2013)  Weekly max dose   Target end date Indefinite  [facility]  Preferred lab   Send INR reminders to    Indications  History of CVA [V12.54] Long term (current) use of anticoagulants [V58.61]        Comments         ANTICOAG TODAY: Anticoagulation Summary as of 04/24/2013   INR goal 2.0-3.0  Selected INR 3.50! (04/24/2013)  Next INR check 05/01/2013  Target end date Indefinite   Indications  History of CVA [V12.54] Long term (current) use of anticoagulants [V58.61]      Anticoagulation Episode Summary   [facility]   Preferred lab    Send INR reminders to    Comments       PATIENT INSTRUCTIONS: Patient Instructions  Patient instructed to take medications as defined in the Anti-coagulation Track section of this encounter.  Patient instructed to OMIT today's dose.  Patient verbalized understanding of these instructions.       FOLLOW-UP Return in 7 days (on 05/01/2013) for Follow up INR at 0845h.  Hulen Luster, III Pharm.D., CACP

## 2013-04-24 NOTE — Assessment & Plan Note (Signed)
Noted MRI scan results as above in overview.   Plan  Follow imaging with either MRI or CT abdomen scan with contrast.  Patient without urinary symptoms at this time.

## 2013-04-24 NOTE — Assessment & Plan Note (Addendum)
Encouraged him to continue with NRT with patches and cut down on the number of cigarettes gradually. I would not recommend Chantix in the setting of treatment for depression.

## 2013-04-24 NOTE — Progress Notes (Signed)
Patient ID: Jacob Joseph, male   DOB: 1955/08/07, 57 y.o.   MRN: [id number]   Subjective:   HPI: [name] is a 57 y.o. with past medical history of hypertension, HIV, infection, depression, history of CVA, and right lower extremity DVT on 04/14/2013 57 y.o. presents to the clinic for followup visit of his right lower extremity pain.  Last clinic visit 04/18/2013 for rt LE pain 2/2 dvt. He was treated with Percocet every 4 hours prn but he reports that he ran out of his pills 3 days ago and his pain has recurred. He has significant pain on ambulation. The leg swelling is actually improving. His continuing with limb elevation as instructed during the last visit. He is seeing Dr Alexandria Lodge today for check INR.  No fevers, chills. No bleeding disorder, while on Coumadin.  Kindly see the A&P for the status of the pt's chronic medical problems.    Past Medical History  Diagnosis Date  . Hypertension   . HIV (human immunodeficiency virus infection)   . Depression   . Stroke 11/2011    Carotids Doppler negative. Right sided weakness resolved, initially on Plavix for 3 months and then continued with ASA.    . TB lung, latent 1988    Treated  . Calcium oxalate crystals in urine 12/23/2011    Asymptomatic, no hematuria. Advised to take plenty of water.   Current Outpatient Prescriptions  Medication Sig Dispense Refill  . Blood Pressure KIT 1 kit by Does not apply route 2 (two) times daily.  1 each  0  . Darunavir Ethanolate (PREZISTA) 800 MG tablet Take 1 tablet (800 mg total) by mouth daily with breakfast.  30 tablet  11  . elvitegravir-cobicistat-emtricitabine-tenofovir (STRIBILD) 150-150-200-300 MG TABS Take 1 tablet by mouth daily with breakfast.  30 tablet  11  . enoxaparin (LOVENOX) 150 MG/ML injection Inject 0.53 mLs (80 mg total) into the skin every 12 (twelve) hours.  14 Syringe  0  . lisinopril (PRINIVIL) 10 MG tablet Take 1 tablet (10 mg total) by mouth daily.  31 tablet  12  . megestrol  (MEGACE ORAL) 40 MG/ML suspension Take 20 mLs (800 mg total) by mouth daily.  240 mL  5  . metoprolol tartrate (LOPRESSOR) 25 MG tablet Take 1 tablet (25 mg total) by mouth 2 (two) times daily.  60 tablet  11  . mirtazapine (REMERON) 15 MG tablet Take 15 mg by mouth at bedtime.      . Nutritional Supplements (ENSURE HIGH PROTEIN) LIQD Take 1 Can by mouth 2 (two) times daily.  60 Can  11  . oxyCODONE-acetaminophen (PERCOCET) 5-325 MG per tablet Take 1 tablet by mouth every 4 (four) hours as needed for pain.  90 tablet  0  . QUEtiapine (SEROQUEL) 100 MG tablet Take 150 mg by mouth at bedtime.      . vitamin E (VITAMIN E) 400 UNIT capsule Take 1 capsule (400 Units total) by mouth daily.  60 capsule  2  . warfarin (COUMADIN) 5 MG tablet Take 2 tablets (10 mg total) by mouth daily.  30 tablet  0  . zolpidem (AMBIEN) 10 MG tablet Take 10 mg by mouth at bedtime as needed. For sleep       No current facility-administered medications for this visit.   Family History  Problem Relation Age of Onset  . Hypertension Mother   . Hypertension Father   . Cancer Father    History   Social History  . Marital Status: Married  Spouse Name: N/A    Number of Children: N/A  . Years of Education: N/A   Social History Main Topics  . Smoking status: Current Every Day Smoker -- 0.50 packs/day for 41 years    Types: Cigarettes    Last Attempt to Quit: 04/18/2012  . Smokeless tobacco: Never Used     Comment: wants something to stop all together  . Alcohol Use: No  . Drug Use: No     Comment: every 2-3 days prior to stroke in May 2013, stopped using July 2013  . Sexual Activity: Yes    Partners: Female     Comment: declined condoms   Other Topics Concern  . None   Social History Narrative   Pt lives with wife and grandkid in [place].   Has applied for disability and is pending.   Worked in [place] before about 3 years ago.         Review of Systems: Constitutional: Denies fever, chills,  diaphoresis, appetite change and fatigue.  Respiratory: Denies SOB, DOE, cough, chest tightness, and wheezing. Denies chest pain. Cardiovascular: No chest pain, palpitations and leg swelling.  Gastrointestinal: No abdominal pain, nausea, vomiting, bloody stools Genitourinary: No dysuria, frequency, hematuria, or flank pain.  Psych: No depression symptoms. No SI or SA.   Objective:  Physical Exam: Filed Vitals:   04/24/13 0820  BP: 109/76  Pulse: 92  Temp: 97 F (36.1 C)  TempSrc: Oral  Height: 6' (1.829 m)  Weight: 173 lb 11.2 oz (78.79 kg)  SpO2: 99%   General: Well nourished. No acute distress.  HEENT: Normal oral mucosa. MMM.  Lungs: CTA bilaterally. Heart: RRR; no extra sounds or murmurs  Abdomen: Non-distended, normal BS, soft, nontender; no hepatosplenomegaly  Extremities: weakness and atrophy involving the right upper extremity. Right lower extremity moderately swollen with moderate tenderness to palpation. Pulses are present. No signs of infection. No increased inflammation. Neurologic: Normal EOM,  Alert and oriented x3. No obvious neurologic/cranial nerve deficits.  Assessment & Plan:  I have discussed my assessment and plan  with  my attending in the clinic, Dr.Bhardwaj as detailed under problem based charting.

## 2013-04-28 NOTE — Progress Notes (Signed)
Case discussed with Dr.Kazibwe at the time of the visit.  We reviewed the resident's history and exam and pertinent patient test results.  I agree with the assessment, diagnosis, and plan of care documented in the resident's note.    

## 2013-05-01 ENCOUNTER — Encounter: Payer: Self-pay | Admitting: Internal Medicine

## 2013-05-01 ENCOUNTER — Ambulatory Visit (INDEPENDENT_AMBULATORY_CARE_PROVIDER_SITE_OTHER): Payer: Medicaid Other | Admitting: Pharmacist

## 2013-05-01 DIAGNOSIS — Z8673 Personal history of transient ischemic attack (TIA), and cerebral infarction without residual deficits: Secondary | ICD-10-CM

## 2013-05-01 DIAGNOSIS — I82409 Acute embolism and thrombosis of unspecified deep veins of unspecified lower extremity: Secondary | ICD-10-CM

## 2013-05-01 DIAGNOSIS — Z7901 Long term (current) use of anticoagulants: Secondary | ICD-10-CM

## 2013-05-01 MED ORDER — WARFARIN SODIUM 5 MG PO TABS: 5.0000 mg | ORAL_TABLET | Freq: Every day | ORAL | Status: DC

## 2013-05-01 NOTE — Patient Instructions (Signed)
Patient instructed to take medications as defined in the Anti-coagulation Track section of this encounter.  Patient instructed to take today's dose.  Patient verbalized understanding of these instructions.    

## 2013-05-01 NOTE — Progress Notes (Signed)
Anti-Coagulation Progress Note  Jacob Joseph is a 57 y.o. male who is currently on an anti-coagulation regimen.    RECENT RESULTS: Recent results are below, the most recent result is correlated with a dose of 37.5 mg. per week: Lab Results  Component Value Date   INR 3.3 05/01/2013   INR 3.50 04/24/2013   INR 4.19 04/17/2013    ANTI-COAG DOSE: Anticoagulation Dose Instructions as of 05/01/2013     [name] Tue Wed Thu Fri Sat   New Dose 5 mg 5 mg 5 mg 5 mg 5 mg 5 mg 5 mg       ANTICOAG SUMMARY: Anticoagulation Episode Summary   Current INR goal 2.0-3.0  Next INR check 05/08/2013  INR from last check 3.3! (05/01/2013)  Weekly max dose   Target end date Indefinite  [facility]  Preferred lab   Send INR reminders to    Indications  History of CVA [V12.54] Long term (current) use of anticoagulants [V58.61]        Comments         ANTICOAG TODAY: Anticoagulation Summary as of 05/01/2013   INR goal 2.0-3.0  Selected INR 3.3! (05/01/2013)  Next INR check 05/08/2013  Target end date Indefinite   Indications  History of CVA [V12.54] Long term (current) use of anticoagulants [V58.61]      Anticoagulation Episode Summary   [facility]   Preferred lab    Send INR reminders to    Comments       PATIENT INSTRUCTIONS: Patient Instructions  Patient instructed to take medications as defined in the Anti-coagulation Track section of this encounter.  Patient instructed to take today's dose.  Patient verbalized understanding of these instructions.       FOLLOW-UP Return in 7 days (on 05/08/2013) for Follow up INR at 0900h.  Hulen Luster, III Pharm.D., CACP

## 2013-05-01 NOTE — Progress Notes (Signed)
Indication: Unprovoked DVT.  Duration: Until 10/12/2013 and then patient preference after discussion of risks and benefits of long term anticoagulation.  INR: Above target.  Agree with Dr. Saralyn Pilar assessment and plan.

## 2013-05-08 ENCOUNTER — Other Ambulatory Visit (INDEPENDENT_AMBULATORY_CARE_PROVIDER_SITE_OTHER): Payer: Medicaid Other

## 2013-05-08 ENCOUNTER — Other Ambulatory Visit: Payer: Self-pay | Admitting: *Deleted

## 2013-05-08 ENCOUNTER — Ambulatory Visit (INDEPENDENT_AMBULATORY_CARE_PROVIDER_SITE_OTHER): Payer: Medicaid Other | Admitting: Pharmacist

## 2013-05-08 ENCOUNTER — Other Ambulatory Visit: Payer: Self-pay | Admitting: Internal Medicine

## 2013-05-08 DIAGNOSIS — I82409 Acute embolism and thrombosis of unspecified deep veins of unspecified lower extremity: Secondary | ICD-10-CM

## 2013-05-08 DIAGNOSIS — M79604 Pain in right leg: Secondary | ICD-10-CM

## 2013-05-08 DIAGNOSIS — M79609 Pain in unspecified limb: Secondary | ICD-10-CM

## 2013-05-08 DIAGNOSIS — Z7901 Long term (current) use of anticoagulants: Secondary | ICD-10-CM

## 2013-05-08 DIAGNOSIS — Z8673 Personal history of transient ischemic attack (TIA), and cerebral infarction without residual deficits: Secondary | ICD-10-CM

## 2013-05-08 DIAGNOSIS — I82401 Acute embolism and thrombosis of unspecified deep veins of right lower extremity: Secondary | ICD-10-CM

## 2013-05-08 LAB — COMPLETE METABOLIC PANEL WITH GFR
Albumin: 3.6 g/dL (ref 3.5–5.2)
BUN: 20 mg/dL (ref 6–23)
CO2: 15 mEq/L — ABNORMAL LOW (ref 19–32)
Calcium: 9.3 mg/dL (ref 8.4–10.5)
Chloride: 108 mEq/L (ref 96–112)
GFR, Est Non African American: 83 mL/min
Glucose, Bld: 124 mg/dL — ABNORMAL HIGH (ref 70–99)
Potassium: 4.2 mEq/L (ref 3.5–5.3)
Sodium: 135 mEq/L (ref 135–145)
Total Protein: 8 g/dL (ref 6.0–8.3)

## 2013-05-08 MED ORDER — WARFARIN SODIUM 5 MG PO TABS: ORAL_TABLET | ORAL | Status: DC

## 2013-05-08 MED ORDER — GABAPENTIN 300 MG PO CAPS: 300.0000 mg | ORAL_CAPSULE | Freq: Three times a day (TID) | ORAL | Status: DC

## 2013-05-08 NOTE — Telephone Encounter (Signed)
Pt is here at the clinic - states Ibuprofen and Tylenol are helping w/the pain.

## 2013-05-08 NOTE — Telephone Encounter (Signed)
Thanks Glenda.  I thought he said Tylenol and Ibuprofen DO NOT help with the pain.   I will hear from him next week.

## 2013-05-08 NOTE — Telephone Encounter (Signed)
Neurontn rx 300mg  called to [facility] per Dr Huntley Dec instructions.

## 2013-05-08 NOTE — Patient Instructions (Signed)
Patient instructed to take medications as defined in the Anti-coagulation Track section of this encounter.  Patient instructed to take today's dose.  Patient verbalized understanding of these instructions.    

## 2013-05-08 NOTE — Progress Notes (Signed)
Anti-Coagulation Progress Note  Jacob Joseph is a 57 y.o. male who is currently on an anti-coagulation regimen.    RECENT RESULTS: Recent results are below, the most recent result is correlated with a dose of 35 mg. per week: Lab Results  Component Value Date   INR 3.0 05/08/2013   INR 3.3 05/01/2013   INR 3.50 04/24/2013    ANTI-COAG DOSE: Anticoagulation Dose Instructions as of 05/08/2013     [name] Tue Wed Thu Fri Sat   New Dose 5 mg 2.5 mg 2.5 mg 2.5 mg 5 mg 5 mg 5 mg       ANTICOAG SUMMARY: Anticoagulation Episode Summary   Current INR goal 2.0-3.0  Next INR check 05/15/2013  INR from last check 3.0 (05/08/2013)  Weekly max dose   Target end date Indefinite  [facility]  Preferred lab   Send INR reminders to    Indications  History of CVA [V12.54] Long term (current) use of anticoagulants [V58.61]        Comments         ANTICOAG TODAY: Anticoagulation Summary as of 05/08/2013   INR goal 2.0-3.0  Selected INR 3.0 (05/08/2013)  Next INR check 05/15/2013  Target end date Indefinite   Indications  History of CVA [V12.54] Long term (current) use of anticoagulants [V58.61]      Anticoagulation Episode Summary   [facility]   Preferred lab    Send INR reminders to    Comments       PATIENT INSTRUCTIONS: Patient Instructions  Patient instructed to take medications as defined in the Anti-coagulation Track section of this encounter.  Patient instructed to take today's dose.  Patient verbalized understanding of these instructions.       FOLLOW-UP Return in 7 days (on 05/15/2013) for Follow up INR at 1045h.  Hulen Luster, III Pharm.D., CACP

## 2013-05-09 ENCOUNTER — Other Ambulatory Visit: Payer: Self-pay | Admitting: Internal Medicine

## 2013-05-15 ENCOUNTER — Ambulatory Visit (INDEPENDENT_AMBULATORY_CARE_PROVIDER_SITE_OTHER): Payer: Medicaid Other | Admitting: Pharmacist

## 2013-05-15 DIAGNOSIS — I82409 Acute embolism and thrombosis of unspecified deep veins of unspecified lower extremity: Secondary | ICD-10-CM

## 2013-05-15 DIAGNOSIS — Z7901 Long term (current) use of anticoagulants: Secondary | ICD-10-CM

## 2013-05-15 DIAGNOSIS — Z8673 Personal history of transient ischemic attack (TIA), and cerebral infarction without residual deficits: Secondary | ICD-10-CM

## 2013-05-15 NOTE — Patient Instructions (Signed)
Patient instructed to take medications as defined in the Anti-coagulation Track section of this encounter.  Patient instructed to take today's dose.  Patient verbalized understanding of these instructions.    

## 2013-05-15 NOTE — Progress Notes (Signed)
Indication: Unprovoked lower extremity DVT. Duration: Until 10/12/2013, then reassess. INR: Below target. Agree with Dr. Groce's assessment and plan. 

## 2013-05-15 NOTE — Progress Notes (Signed)
Anti-Coagulation Progress Note  Jacob Joseph is a 57 y.o. male who is currently on an anti-coagulation regimen.    RECENT RESULTS: Recent results are below, the most recent result is correlated with a dose of 27.5 mg. per week: Lab Results  Component Value Date   INR 1.10 05/15/2013   INR 3.0 05/08/2013   INR 3.3 05/01/2013    ANTI-COAG DOSE: Anticoagulation Dose Instructions as of 05/15/2013     [name] Tue Wed Thu Fri Sat   New Dose 5 mg 5 mg 5 mg 2.5 mg 5 mg 5 mg 5 mg       ANTICOAG SUMMARY: Anticoagulation Episode Summary   Current INR goal 2.0-3.0  Next INR check 05/29/2013  INR from last check 1.10! (05/15/2013)  Weekly max dose   Target end date Indefinite  [facility]  Preferred lab   Send INR reminders to    Indications  History of CVA [V12.54] Long term (current) use of anticoagulants [V58.61]        Comments         ANTICOAG TODAY: Anticoagulation Summary as of 05/15/2013   INR goal 2.0-3.0  Selected INR 1.10! (05/15/2013)  Next INR check 05/29/2013  Target end date Indefinite   Indications  History of CVA [V12.54] Long term (current) use of anticoagulants [V58.61]      Anticoagulation Episode Summary   [facility]   Preferred lab    Send INR reminders to    Comments       PATIENT INSTRUCTIONS: Patient Instructions  Patient instructed to take medications as defined in the Anti-coagulation Track section of this encounter.  Patient instructed to take today's dose.  Patient verbalized understanding of these instructions.       FOLLOW-UP Return in 2 weeks (on 05/29/2013) for Follow up INR at 2PM.  Hulen Luster, III Pharm.D., CACP

## 2013-05-16 ENCOUNTER — Ambulatory Visit (AMBULATORY_SURGERY_CENTER): Payer: Medicaid Other | Admitting: Gastroenterology

## 2013-05-16 ENCOUNTER — Other Ambulatory Visit: Payer: Self-pay

## 2013-05-16 ENCOUNTER — Encounter: Payer: Self-pay | Admitting: Gastroenterology

## 2013-05-16 ENCOUNTER — Encounter: Payer: Self-pay | Admitting: Internal Medicine

## 2013-05-16 VITALS — BP 141/77 | HR 74 | Temp 98.3°F | Resp 16 | Ht 72.0 in | Wt 173.0 lb

## 2013-05-16 DIAGNOSIS — B370 Candidal stomatitis: Secondary | ICD-10-CM

## 2013-05-16 DIAGNOSIS — R197 Diarrhea, unspecified: Secondary | ICD-10-CM

## 2013-05-16 DIAGNOSIS — D126 Benign neoplasm of colon, unspecified: Secondary | ICD-10-CM

## 2013-05-16 DIAGNOSIS — R634 Abnormal weight loss: Secondary | ICD-10-CM

## 2013-05-16 DIAGNOSIS — D649 Anemia, unspecified: Secondary | ICD-10-CM

## 2013-05-16 MED ORDER — DIFLUCAN 100 MG PO TABS: 100.0000 mg | ORAL_TABLET | Freq: Every day | ORAL | Status: DC

## 2013-05-16 MED ORDER — FLUCONAZOLE 100 MG PO TABS: 100.0000 mg | ORAL_TABLET | Freq: Every day | ORAL | Status: DC

## 2013-05-16 MED ORDER — SODIUM CHLORIDE 0.9 % IV SOLN: 500.0000 mL | INTRAVENOUS | Status: DC

## 2013-05-16 NOTE — Progress Notes (Signed)
Called to room to assist during endoscopic procedure.  Patient ID and intended procedure confirmed with present staff. Received instructions for my participation in the procedure from the performing physician.  

## 2013-05-16 NOTE — Patient Instructions (Addendum)
YOU HAD AN ENDOSCOPIC PROCEDURE TODAY AT THE [facility]: Refer to the procedure report that was given to you for any specific questions about what was found during the examination.  If the procedure report does not answer your questions, please call your gastroenterologist to clarify.  If you requested that your care partner not be given the details of your procedure findings, then the procedure report has been included in a sealed envelope for you to review at your convenience later.  YOU SHOULD EXPECT: Some feelings of bloating in the abdomen. Passage of more gas than usual.  Walking can help get rid of the air that was put into your GI tract during the procedure and reduce the bloating. If you had a lower endoscopy (such as a colonoscopy or flexible sigmoidoscopy) you may notice spotting of blood in your stool or on the toilet paper. If you underwent a bowel prep for your procedure, then you may not have a normal bowel movement for a few days.  DIET: Your first meal following the procedure should be a light meal and then it is ok to progress to your normal diet.  A half-sandwich or bowl of soup is an example of a good first meal.  Heavy or fried foods are harder to digest and may make you feel nauseous or bloated.  Likewise meals heavy in dairy and vegetables can cause extra gas to form and this can also increase the bloating.  Drink plenty of fluids but you should avoid alcoholic beverages for 24 hours.  ACTIVITY: Your care partner should take you home directly after the procedure.  You should plan to take it easy, moving slowly for the rest of the day.  You can resume normal activity the day after the procedure however you should NOT DRIVE or use heavy machinery for 24 hours (because of the sedation medicines used during the test).    SYMPTOMS TO REPORT IMMEDIATELY: A gastroenterologist can be reached at any hour.  During normal business hours, 8:30 AM to 5:00 PM Monday through Friday,  call [phone].  After hours and on weekends, please call the GI answering service at [phone] who will take a message and have the physician on call contact you.   Following lower endoscopy (colonoscopy or flexible sigmoidoscopy):  Excessive amounts of blood in the stool  Significant tenderness or worsening of abdominal pains  Swelling of the abdomen that is new, acute  Fever of 100F or higher  Following upper endoscopy (EGD)  Vomiting of blood or coffee ground material  New chest pain or pain under the shoulder blades  Painful or persistently difficult swallowing  New shortness of breath  Fever of 100F or higher  Black, tarry-looking stools  FOLLOW UP: If any biopsies were taken you will be contacted by phone or by letter within the next 1-3 weeks.  Call your gastroenterologist if you have not heard about the biopsies in 3 weeks.  Our staff will call the home number listed on your records the next business day following your procedure to check on you and address any questions or concerns that you may have at that time regarding the information given to you following your procedure. This is a courtesy call and so if there is no answer at the home number and we have not heard from you through the emergency physician on call, we will assume that you have returned to your regular daily activities without incident.  SIGNATURES/CONFIDENTIALITY: You and/or your care   partner have signed paperwork which will be entered into your electronic medical record.  These signatures attest to the fact that that the information above on your After Visit Summary has been reviewed and is understood.  Full responsibility of the confidentiality of this discharge information lies with you and/or your care-partner.  Recommendations See procedure report 

## 2013-05-16 NOTE — Op Note (Signed)
 [facility] [address] [place], [address]   ENDOSCOPY PROCEDURE REPORT  PATIENT: Jacob, Joseph  MR#: [id number] BIRTHDATE: 04-May-1956 , 56  yrs. old GENDER: Male ENDOSCOPIST: Meryl Dare, MD, [facility] REFERRED BY:  Dow Adolph, MD PROCEDURE DATE:  05/16/2013 PROCEDURE:  EGD, diagnostic ASA CLASS:     Class III INDICATIONS:  Unexplained diarrhea.   Anemia.   Weight loss. MEDICATIONS: There was residual sedation effect present from prior procedure, MAC sedation, administered by CRNA, and propofol (Diprivan) 50mg  IV TOPICAL ANESTHETIC: none DESCRIPTION OF PROCEDURE: After the risks benefits and alternatives of the procedure were thoroughly explained, informed consent was obtained.  The LB [id number] endoscope was introduced through the mouth and advanced to the second portion of the duodenum  without limitations.  The instrument was slowly withdrawn as the mucosa was fully examined.  ESOPHAGUS: Several white exudates consistent with candidiasis were found in the middle third and upper third of the esophagus. Erythema in distal esophagus. The esophagus was otherwise normal. No biopsies obtained with Coumadin anticoagulation. STOMACH: The mucosa and folds of the stomach appeared normal. DUODENUM: The duodenal mucosa showed no abnormalities in the bulb and second portion of the duodenum.  Retroflexed views revealed no abnormalities.  The scope was then withdrawn from the patient and the procedure completed.  COMPLICATIONS: There were no complications.  ENDOSCOPIC IMPRESSION: 1.   White esophageal exudates consistent with candidiasis 2.   Nonspecific erythema in distal esophagus. 3.   The EGD was otherwise normal.  RECOMMENDATIONS: 1.  Diflucan 100 mg po daily for 7 days 2.  Follow up with PCP and ID   eSigned:  Meryl Dare, MD, [facility] 05/16/2013 3:26 PM

## 2013-05-16 NOTE — Op Note (Signed)
[facility] [address] [place], [address]   COLONOSCOPY PROCEDURE REPORT  PATIENT: Jacob, Joseph  MR#: [id number] BIRTHDATE: 03-23-1956 , 56  yrs. old GENDER: Male ENDOSCOPIST: Meryl Dare, MD, [facility] REFERRED BY: Dow Adolph, MD PROCEDURE DATE:  05/16/2013 PROCEDURE:   Colonoscopy with snare polypectomy First Screening Colonoscopy - Avg.  risk and is 50 yrs.  old or older - No.  Prior Negative Screening - Now for repeat screening. N/A  History of Adenoma - Now for follow-up colonoscopy & has been > or = to 3 yrs.  N/A  Polyps Removed Today? Yes. ASA CLASS:   Class III INDICATIONS: unexplained diarrhea, Anemia, non-specific, and Weight loss. MEDICATIONS: MAC sedation, administered by CRNA and propofol (Diprivan) 250mg  IV DESCRIPTION OF PROCEDURE:   After the risks benefits and alternatives of the procedure were thoroughly explained, informed consent was obtained.  A digital rectal exam revealed no abnormalities of the rectum.   The LB [id number]  endoscope was introduced through the anus and advanced to the cecum, which was identified by both the appendix and ileocecal valve. No adverse events experienced.   The quality of the prep was good, using MoviPrep  The instrument was then slowly withdrawn as the colon was fully examined.  COLON FINDINGS: A sessile polyp measuring 5 mm in size was found in the sigmoid colon.  A polypectomy was performed with a cold snare. The resection was complete and the polyp tissue was completely retrieved.   The colon was otherwise normal.  There was no diverticulosis, inflammation, polyps or cancers unless previously stated. Random biopsies for diarrhea not obtained as diarrhea was resolving and pt on Coumadin. Retroflexed views revealed small internal hemorrhoids. The time to cecum=2 minutes 23 seconds. Withdrawal time=8 minutes 28 seconds.  The scope was withdrawn and the procedure completed. COMPLICATIONS:  There were no complications.  ENDOSCOPIC IMPRESSION: 1.   Sessile polyp measuring 5 mm in the sigmoid colon; polypectomy performed with a cold snare 2.   Small internal hemorrhoids  RECOMMENDATIONS: 1.  Await pathology results 2.  Repeat colonoscopy in 5 years if polyp adenomatous; otherwise 10 years  eSigned:  Meryl Dare, MD, [facility] 05/16/2013 3:16 PM

## 2013-05-16 NOTE — Progress Notes (Signed)
A/ox3 pleased with MAC, report to Wendy RN 

## 2013-05-17 ENCOUNTER — Telehealth: Payer: Self-pay

## 2013-05-17 NOTE — Progress Notes (Signed)
Patient ID: Jacob Joseph, male   DOB: 02-27-1956, 57 y.o.   MRN: [id number] THP CM [name] discharged client on this date from [facility]. Cm will enroll client into Tap program. Client will establish case management services with [facility] and take advantage of their services.

## 2013-05-17 NOTE — Telephone Encounter (Signed)
  Follow up Call-  Call back number 05/16/2013  Post procedure Call Back phone  # [phone]  Permission to leave phone message Yes     Patient questions:  Do you have a fever, pain , or abdominal swelling? no Pain Score  0 *  Have you tolerated food without any problems? yes  Have you been able to return to your normal activities? yes  Do you have any questions about your discharge instructions: Diet   no Medications  no Follow up visit  no  Do you have questions or concerns about your Care? no  Actions: * If pain score is 4 or above: No action needed, pain <4.  No problems per the pt. Maw

## 2013-05-23 ENCOUNTER — Encounter: Payer: Self-pay | Admitting: Gastroenterology

## 2013-05-26 ENCOUNTER — Ambulatory Visit (INDEPENDENT_AMBULATORY_CARE_PROVIDER_SITE_OTHER): Payer: Medicaid Other | Admitting: Internal Medicine

## 2013-05-26 ENCOUNTER — Encounter: Payer: Self-pay | Admitting: Internal Medicine

## 2013-05-26 VITALS — BP 128/81 | HR 96 | Temp 98.6°F | Wt 185.1 lb

## 2013-05-26 DIAGNOSIS — I82409 Acute embolism and thrombosis of unspecified deep veins of unspecified lower extremity: Secondary | ICD-10-CM

## 2013-05-26 DIAGNOSIS — I82401 Acute embolism and thrombosis of unspecified deep veins of right lower extremity: Secondary | ICD-10-CM

## 2013-05-26 DIAGNOSIS — Z7901 Long term (current) use of anticoagulants: Secondary | ICD-10-CM

## 2013-05-26 LAB — POCT INR: INR: 5.6

## 2013-05-26 NOTE — Patient Instructions (Signed)
We will check you INR today. Keep monitoring your leg for swelling.  If the swelling doesn't go down when you put your legs up for a while, please call the clinic for advise. Take you Coumadin as instructed. Depending on your INR today we may change the dosage.

## 2013-05-26 NOTE — Progress Notes (Signed)
  Subjective:    Patient ID: Jacob Joseph, male    DOB: [birth year], 56 y.o.   MRN: [id number]  HPI  Hx significant for HIV and recent DVT right LE Sept 26, 2014 managed with Warfarin.  He presents to acute clinic today with complaints of right LE swelling.  On exam he states that the swelling has actually gone back down after elevating his legs. Denies pain.  Last INR 1.1 on 05/15/2013. Warfarin regimen is 5 mg qd.  Review of Systems  Constitutional: Negative for fever and fatigue.  HENT: Negative for nosebleeds.   Respiratory: Negative for shortness of breath.   Cardiovascular: Positive for leg swelling. Negative for chest pain and palpitations.  Gastrointestinal: Negative for blood in stool.  Genitourinary: Negative for hematuria.  Musculoskeletal: Negative for myalgias.  Skin: Negative for rash and wound.  Neurological: Negative for dizziness.  Hematological: Does not bruise/bleed easily.       Objective:   Physical Exam  Constitutional: He is oriented to person, place, and time. He appears well-developed and well-nourished. No distress.  HENT:  Head: Normocephalic and atraumatic.  Eyes: Conjunctivae and EOM are normal. Pupils are equal, round, and reactive to light.  Neck: Normal range of motion.  Cardiovascular: Normal rate, regular rhythm, normal heart sounds and intact distal pulses.   Pulmonary/Chest: Effort normal and breath sounds normal.  Abdominal: Soft. Bowel sounds are normal.  Musculoskeletal: He exhibits tenderness. He exhibits no edema.       Right lower leg: He exhibits tenderness.  Mildly tender R calf to deep palpation  Neurological: He is alert and oriented to person, place, and time.  Skin: Skin is warm and dry. No rash noted. No erythema.  Psychiatric: He has a normal mood and affect.          Assessment & Plan:  See separate detailed problem list charting:  #1 DVT, RLE: likely some degree of clot burden present given recent DVT 04/14/2013 -check  INR today---> 5.6 thus will hold Warfarin and f/u Monday with Dr. Alexandria Lodge in Warfarin Clinic, no bleeding evident including no blood in stools, hematuria, epistasis or bleeding gums -will route labs -discussed plans with pt who is in agreement

## 2013-05-26 NOTE — Assessment & Plan Note (Signed)
Supratherapeutic INR 5.6 today.  Pt to hold Coumdain over the weekend and f/u Monday with Dr. Alexandria Lodge in Warfarin Clinic

## 2013-05-26 NOTE — Assessment & Plan Note (Signed)
Repeat clot less concerning given clinical exam today.  Likely still has clot burden as well given recent DVT ~ 6 weeks ago. -will defer LE Doppler today and check INR today---> INR 5.6--> hold Warfarin, f/u Monday (2 days) in Warfarin clinic

## 2013-05-29 ENCOUNTER — Ambulatory Visit (INDEPENDENT_AMBULATORY_CARE_PROVIDER_SITE_OTHER): Payer: Medicaid Other | Admitting: Pharmacist

## 2013-05-29 ENCOUNTER — Other Ambulatory Visit: Payer: Self-pay | Admitting: Internal Medicine

## 2013-05-29 DIAGNOSIS — Z8673 Personal history of transient ischemic attack (TIA), and cerebral infarction without residual deficits: Secondary | ICD-10-CM

## 2013-05-29 DIAGNOSIS — I82409 Acute embolism and thrombosis of unspecified deep veins of unspecified lower extremity: Secondary | ICD-10-CM

## 2013-05-29 DIAGNOSIS — Z7901 Long term (current) use of anticoagulants: Secondary | ICD-10-CM

## 2013-05-29 DIAGNOSIS — C220 Liver cell carcinoma: Secondary | ICD-10-CM

## 2013-05-29 LAB — POCT INR: INR: 2.1

## 2013-05-29 NOTE — Patient Instructions (Signed)
Patient instructed to take medications as defined in the Anti-coagulation Track section of this encounter.  Patient instructed to take today's dose.  Patient verbalized understanding of these instructions.    

## 2013-05-29 NOTE — Progress Notes (Signed)
Anti-Coagulation Progress Note  Jacob Joseph is a 57 y.o. male who is currently on an anti-coagulation regimen.    RECENT RESULTS: Recent results are below, the most recent result is correlated with a dose of 27.5 mg. per week: Lab Results  Component Value Date   INR 2.10 05/29/2013   INR 5.6 05/26/2013   INR 1.10 05/15/2013    ANTI-COAG DOSE: Anticoagulation Dose Instructions as of 05/29/2013     [name] Tue Wed Thu Fri Sat   New Dose 5 mg 2.5 mg 2.5 mg 5 mg 5 mg 5 mg 5 mg       ANTICOAG SUMMARY: Anticoagulation Episode Summary   Current INR goal 2.0-3.0  Next INR check 06/05/2013  INR from last check 2.10 (05/29/2013)  Weekly max dose   Target end date Indefinite  INR check location [facility]  Preferred lab   Send INR reminders to    Indications  History of CVA [V12.54] Warfarin anticoagulation [V58.61]        Comments         ANTICOAG TODAY: Anticoagulation Summary as of 05/29/2013   INR goal 2.0-3.0  Selected INR 2.10 (05/29/2013)  Next INR check 06/05/2013  Target end date Indefinite   Indications  History of CVA [V12.54] Warfarin anticoagulation [V58.61]      Anticoagulation Episode Summary   INR check location [facility]   Preferred lab    Send INR reminders to    Comments       PATIENT INSTRUCTIONS: Patient Instructions  Patient instructed to take medications as defined in the Anti-coagulation Track section of this encounter.  Patient instructed to take today's dose.  Patient verbalized understanding of these instructions.       FOLLOW-UP Return in 7 days (on 06/05/2013) for Follow up INR on 06/05/13 at 2:45PM.  Hulen Luster, III Pharm.D., CACP

## 2013-05-30 NOTE — Progress Notes (Signed)
Case discussed with Dr. Schooler at the time of the visit.  We reviewed the resident's history and exam and pertinent patient test results.  I agree with the assessment, diagnosis, and plan of care documented in the resident's note.     

## 2013-05-30 NOTE — Progress Notes (Signed)
Indication: Unprovoked lower extremity DVT. Duration: Until 10/12/2013, then reassess. INR: Below target. Agree with Dr. Saralyn Pilar assessment and plan.

## 2013-05-31 ENCOUNTER — Ambulatory Visit (INDEPENDENT_AMBULATORY_CARE_PROVIDER_SITE_OTHER): Payer: Medicaid Other | Admitting: Internal Medicine

## 2013-05-31 ENCOUNTER — Encounter: Payer: Self-pay | Admitting: Internal Medicine

## 2013-05-31 VITALS — BP 115/72 | HR 97 | Temp 97.8°F | Ht 72.0 in | Wt 186.8 lb

## 2013-05-31 DIAGNOSIS — J449 Chronic obstructive pulmonary disease, unspecified: Secondary | ICD-10-CM

## 2013-05-31 DIAGNOSIS — F1910 Other psychoactive substance abuse, uncomplicated: Secondary | ICD-10-CM

## 2013-05-31 DIAGNOSIS — F17200 Nicotine dependence, unspecified, uncomplicated: Secondary | ICD-10-CM

## 2013-05-31 DIAGNOSIS — I82409 Acute embolism and thrombosis of unspecified deep veins of unspecified lower extremity: Secondary | ICD-10-CM

## 2013-05-31 DIAGNOSIS — M545 Low back pain: Secondary | ICD-10-CM

## 2013-05-31 DIAGNOSIS — M1990 Unspecified osteoarthritis, unspecified site: Secondary | ICD-10-CM

## 2013-05-31 DIAGNOSIS — I10 Essential (primary) hypertension: Secondary | ICD-10-CM

## 2013-05-31 DIAGNOSIS — M5137 Other intervertebral disc degeneration, lumbosacral region: Secondary | ICD-10-CM

## 2013-05-31 DIAGNOSIS — F17210 Nicotine dependence, cigarettes, uncomplicated: Secondary | ICD-10-CM

## 2013-05-31 DIAGNOSIS — I82402 Acute embolism and thrombosis of unspecified deep veins of left lower extremity: Secondary | ICD-10-CM

## 2013-05-31 MED ORDER — OMEPRAZOLE 40 MG PO CPDR: 40.0000 mg | DELAYED_RELEASE_CAPSULE | Freq: Every day | ORAL | Status: DC

## 2013-05-31 MED ORDER — MELOXICAM 7.5 MG PO TABS: 7.5000 mg | ORAL_TABLET | Freq: Every day | ORAL | Status: DC | PRN

## 2013-05-31 NOTE — Assessment & Plan Note (Signed)
BP Readings from Last 3 Encounters:  05/31/13 115/72  05/26/13 128/81  05/16/13 141/77    Lab Results  Component Value Date   NA 135 05/08/2013   K 4.2 05/08/2013   CREATININE 1.01 05/08/2013    Assessment: Blood pressure control:   well controlled Progress toward BP goal:    At goal  Comments: Compliant with meds  Plan: Medications:  continue current medications Educational resources provided:   Self management tools provided:   Other plans: Followup in one month

## 2013-05-31 NOTE — Assessment & Plan Note (Addendum)
Patient has failed trial of nicotine patches. His wife smokes as well.  Patient is not a perfect candidate for Chantix given his extensive psychiatric history. I have encouraged him to focus on treatment of his chronic back pain since this might be an obstacle in smoking cessation. Once his pain is well-controlled will consider trial of Chantix.
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
## 2013-05-31 NOTE — Assessment & Plan Note (Signed)
Previous right lower extremity pain has significantly improved. Continued to encourage the patient to elevate his lower extremity.

## 2013-05-31 NOTE — Assessment & Plan Note (Signed)
Discussed at length with patient and his family regarding pain management. Patient does not wish to be on opiates. Patient does not wish to have back injections. Patient has tried multiple over-the-counter NSAID including Aleve, Motrin, and BC powders without much relief. No previous history of surgery.  Plan. -Referral to pain clinic. -Prior for Mobic at 7.5 mg to 15 mg daily when necessary - Add omeprazole 40 mg daily for GI prophylaxis. No history of GI bleeding, but patient is on Coumadin - No opiates due to history of drug abuse - Cannot get outpatient PT due to cost - UDS today - Followup in one month.

## 2013-05-31 NOTE — Patient Instructions (Addendum)
Please take Mobic for your pain Please take Prilosec 40 mg 30 min before breakfast   I will refer you to pain clinic  Please take the rest of your medications as before  I will also write a referral to have your lungs checked - the nurse will help call for that appt Please follow up in 1 month

## 2013-05-31 NOTE — Assessment & Plan Note (Signed)
Patient describes increased wheezing, cough, without chest pain, fevers, chills, or any other symptoms. The symptoms of cough, and wheezing seemed to be more chronic than acute. Patient has at least 20 pack year history of smoking. Possibility of COPD, is high. No previous history of PFTs.  Plan -Referral for PFTs. -Consider inhaler if COPD is confirmed - Counseled the patient about smoking cessation.

## 2013-06-01 LAB — PRESCRIPTION ABUSE MONITORING 15P, URINE
Amphetamine/Meth: NEGATIVE ng/mL
Benzodiazepine Screen, Urine: NEGATIVE ng/mL
Buprenorphine, Urine: NEGATIVE ng/mL
Carisoprodol, Urine: NEGATIVE ng/mL
Fentanyl, Ur: NEGATIVE ng/mL
Methadone Screen, Urine: NEGATIVE ng/mL
Opiate Screen, Urine: NEGATIVE ng/mL
Oxycodone Screen, Ur: NEGATIVE ng/mL
Tramadol Scrn, Ur: NEGATIVE ng/mL

## 2013-06-01 NOTE — Progress Notes (Signed)
Case discussed with Dr. Kazibwe at the time of the visit.  We reviewed the resident's history and exam and pertinent patient test results.  I agree with the assessment, diagnosis and plan of care documented in the resident's note. 

## 2013-06-02 LAB — ZOLPIDEM (LC/MS-MS), URINE: Zolpidem metabolite (GC/LC/MS) Ur, confirm: 741 ng/mL

## 2013-06-05 ENCOUNTER — Ambulatory Visit: Payer: Medicaid Other

## 2013-06-07 ENCOUNTER — Ambulatory Visit
Admission: RE | Admit: 2013-06-07 | Discharge: 2013-06-07 | Disposition: A | Discharge status: Home / Self Care | Payer: Medicaid Other | Source: Ambulatory Visit | Attending: Internal Medicine | Admitting: Internal Medicine

## 2013-06-07 DIAGNOSIS — C220 Liver cell carcinoma: Secondary | ICD-10-CM

## 2013-06-08 ENCOUNTER — Encounter (HOSPITAL_COMMUNITY): Payer: Medicaid Other

## 2013-06-09 ENCOUNTER — Ambulatory Visit (HOSPITAL_COMMUNITY)
Admission: RE | Admit: 2013-06-09 | Discharge: 2013-06-09 | Disposition: A | Discharge status: Home / Self Care | Payer: Medicaid Other | Source: Ambulatory Visit | Attending: Internal Medicine | Admitting: Internal Medicine

## 2013-06-09 DIAGNOSIS — J449 Chronic obstructive pulmonary disease, unspecified: Secondary | ICD-10-CM

## 2013-06-09 DIAGNOSIS — J4489 Other specified chronic obstructive pulmonary disease: Secondary | ICD-10-CM | POA: Insufficient documentation

## 2013-06-09 LAB — PULMONARY FUNCTION TEST
DL/VA % pred: 69 %
DLCO cor: 18.4 ml/min/mmHg
DLCO unc % pred: 52 %
DLCO unc: 18.4 ml/min/mmHg
FEF 25-75 Pre: 1.52 L/sec
FEV1-%Change-Post: -31 %
FEV1-%Pred-Pre: 52 %
FEV1-Post: 1.44 L
FEV1FVC-%Change-Post: -20 %
FEV6-%Change-Post: -14 %
FEV6-%Pred-Post: 64 %
FEV6-Post: 3.26 L
FEV6-Pre: 3.82 L
FEV6FVC-%Change-Post: -2 %
FEV6FVC-%Pred-Post: 99 %
FEV6FVC-%Pred-Pre: 102 %
FVC-%Change-Post: -14 %
FVC-%Pred-Post: 65 %
FVC-%Pred-Pre: 75 %
FVC-Post: 3.41 L
Post FEV6/FVC ratio: 95 %
RV % pred: 78 %
TLC % pred: 83 %
TLC: 6.17 L

## 2013-06-09 MED ORDER — ALBUTEROL SULFATE (5 MG/ML) 0.5% IN NEBU
2.5000 mg | INHALATION_SOLUTION | Freq: Once | RESPIRATORY_TRACT | Status: AC
  Administered 2013-06-09: 2.5 mg via RESPIRATORY_TRACT

## 2013-06-10 ENCOUNTER — Other Ambulatory Visit: Payer: Self-pay | Admitting: Internal Medicine

## 2013-06-12 ENCOUNTER — Other Ambulatory Visit: Payer: Self-pay | Admitting: *Deleted

## 2013-06-12 ENCOUNTER — Ambulatory Visit (INDEPENDENT_AMBULATORY_CARE_PROVIDER_SITE_OTHER): Payer: Medicaid Other | Admitting: Pharmacist

## 2013-06-12 DIAGNOSIS — I82409 Acute embolism and thrombosis of unspecified deep veins of unspecified lower extremity: Secondary | ICD-10-CM

## 2013-06-12 DIAGNOSIS — Z8673 Personal history of transient ischemic attack (TIA), and cerebral infarction without residual deficits: Secondary | ICD-10-CM

## 2013-06-12 DIAGNOSIS — Z7901 Long term (current) use of anticoagulants: Secondary | ICD-10-CM

## 2013-06-12 LAB — POCT INR: INR: 3.9

## 2013-06-12 MED ORDER — METOPROLOL TARTRATE 25 MG PO TABS: 25.0000 mg | ORAL_TABLET | Freq: Two times a day (BID) | ORAL | Status: DC

## 2013-06-12 NOTE — Progress Notes (Signed)
Patient instructed to take medications as defined in the Anti-coagulation Track section of this encounter.  Patient instructed to take today's dose.  Patient verbalized understanding of these instructions.    

## 2013-06-12 NOTE — Telephone Encounter (Signed)
I received a refill request for Metoprolol 25 mg 1 twice daily from pharmacy.  This is not listed on med list.  Pharmacy last filled 05/09/13. Pt called and he is still taking Metoprolol.  He also needs refill on Coumadin.

## 2013-06-12 NOTE — Patient Instructions (Signed)
Patient instructed to take medications as defined in the Anti-coagulation Track section of this encounter.  Patient instructed to OMIT today's dose; OMIT tomorrow's dose of warfarin as well.  Patient verbalized understanding of these instructions.

## 2013-06-12 NOTE — Telephone Encounter (Signed)
I refilled the metoprolol; there should be additional warfarin refills available from the October prescription - please confirm with pharmacy.

## 2013-06-13 NOTE — Telephone Encounter (Signed)
Pharmacy called and they do have refills on coumadin.

## 2013-06-19 ENCOUNTER — Ambulatory Visit (INDEPENDENT_AMBULATORY_CARE_PROVIDER_SITE_OTHER): Payer: Medicaid Other | Admitting: Pharmacist

## 2013-06-19 DIAGNOSIS — Z8673 Personal history of transient ischemic attack (TIA), and cerebral infarction without residual deficits: Secondary | ICD-10-CM

## 2013-06-19 DIAGNOSIS — I82409 Acute embolism and thrombosis of unspecified deep veins of unspecified lower extremity: Secondary | ICD-10-CM

## 2013-06-19 DIAGNOSIS — Z7901 Long term (current) use of anticoagulants: Secondary | ICD-10-CM

## 2013-06-19 LAB — POCT INR: INR: 2.7

## 2013-06-19 NOTE — Patient Instructions (Signed)
Patient instructed to take medications as defined in the Anti-coagulation Track section of this encounter.  Patient instructed to take today's dose.  Patient verbalized understanding of these instructions.    

## 2013-06-19 NOTE — Progress Notes (Signed)
Anti-Coagulation Progress Note  Jacob Joseph is a 57 y.o. male who is currently on an anti-coagulation regimen.    RECENT RESULTS: Recent results are below, the most recent result is correlated with a dose of 22.5 mg. per week: Lab Results  Component Value Date   INR 2.70 06/19/2013   INR 3.90 06/12/2013   INR 2.10 05/29/2013    ANTI-COAG DOSE: Anticoagulation Dose Instructions as of 06/19/2013     [name] Tue Wed Thu Fri Sat   New Dose 5 mg 2.5 mg 2.5 mg 2.5 mg 5 mg 2.5 mg 2.5 mg       ANTICOAG SUMMARY: Anticoagulation Episode Summary   Current INR goal 2.0-3.0  Next INR check 07/10/2013  INR from last check 2.70 (06/19/2013)  Weekly max dose   Target end date Indefinite  INR check location [facility]  Preferred lab   Send INR reminders to    Indications  History of CVA [V12.54] Warfarin anticoagulation [V58.61]        Comments         ANTICOAG TODAY: Anticoagulation Summary as of 06/19/2013   INR goal 2.0-3.0  Selected INR 2.70 (06/19/2013)  Next INR check 07/10/2013  Target end date Indefinite   Indications  History of CVA [V12.54] Warfarin anticoagulation [V58.61]      Anticoagulation Episode Summary   INR check location [facility]   Preferred lab    Send INR reminders to    Comments       PATIENT INSTRUCTIONS: Patient Instructions  Patient instructed to take medications as defined in the Anti-coagulation Track section of this encounter.  Patient instructed to take today's dose.  Patient verbalized understanding of these instructions.       FOLLOW-UP Return in 3 weeks (on 07/10/2013) for Follow up INR at 2PM.  Hulen Luster, III Pharm.D., CACP

## 2013-06-28 ENCOUNTER — Other Ambulatory Visit: Payer: Self-pay | Admitting: Internal Medicine

## 2013-06-28 ENCOUNTER — Encounter: Payer: Self-pay | Admitting: Internal Medicine

## 2013-07-05 ENCOUNTER — Ambulatory Visit (INDEPENDENT_AMBULATORY_CARE_PROVIDER_SITE_OTHER): Payer: Medicaid Other | Admitting: Internal Medicine

## 2013-07-05 ENCOUNTER — Encounter: Payer: Self-pay | Admitting: Internal Medicine

## 2013-07-05 VITALS — BP 143/94 | HR 108 | Temp 97.8°F | Ht 72.0 in | Wt 204.4 lb

## 2013-07-05 DIAGNOSIS — F17200 Nicotine dependence, unspecified, uncomplicated: Secondary | ICD-10-CM

## 2013-07-05 DIAGNOSIS — J449 Chronic obstructive pulmonary disease, unspecified: Secondary | ICD-10-CM

## 2013-07-05 DIAGNOSIS — F17210 Nicotine dependence, cigarettes, uncomplicated: Secondary | ICD-10-CM

## 2013-07-05 DIAGNOSIS — F329 Major depressive disorder, single episode, unspecified: Secondary | ICD-10-CM

## 2013-07-05 MED ORDER — ALBUTEROL SULFATE HFA 108 (90 BASE) MCG/ACT IN AERS: 2.0000 | INHALATION_SPRAY | Freq: Four times a day (QID) | RESPIRATORY_TRACT | Status: DC | PRN

## 2013-07-05 NOTE — Assessment & Plan Note (Signed)
He has tried nicotine patches without success before. He does not wish to restart them.  Plan - Patient inquires about Chintax. I will consider this medication or even Wellbutrin which has the benefit of treating his Depression at the same time. Patient currently on Remeron and seroquel. - I will discuss NRT further on his next visit in two weeks.

## 2013-07-05 NOTE — Assessment & Plan Note (Addendum)
Patient's clinical findings and PFTs are consistent with moderate COPD. Clinical picture slightly worse than what the PFTs suggest. I discussed the results with Dr Josem Kaufmann.  Plan -Supplied him with a sample over to inhaler and demonstrated good inhaler technique - I will consider Spiriva on his next visit and quickly add inhaled steroids - Will followup in 2 weeks.   Medication Samples have been provided to the patient.  Drug name: Ventolin inhaler  Qty: 1 inhaler  LOT: [id number]  Exp.Date: 03/2014  The patient has been instructed regarding the correct time, dose, and frequency of taking this medication, including desired effects and most common side effects.   Jacob Joseph 6:15 PM 07/05/2013

## 2013-07-05 NOTE — Patient Instructions (Signed)
Please start taking albuterol as needed for wheezing and shortness of breath I will call you regarding my recommendations for smoking cessation Chronic Obstructive Pulmonary Disease Chronic obstructive pulmonary disease (COPD) is a lung disease. The lungs become damaged. This makes it hard to get air in and out of your lungs. The damage to your lungs cannot be changed. There are things you can do to improve the lungs and make you feel better. HOME CARE  Take all medicines as told by your doctor.  Use medicines that you breathe in (inhale) as told by your doctor.  Avoid medicines or cough syrups that dry up your airway (antihistamines) and do not allow you to get rid of thick spit.  If you smoke, stop.  Avoid being around smoke, chemicals, and fumes that bother your breathing.  Avoid people that have a catchy (contagious) sickness.  Avoid going outside when it is very hot, cold, or humid.  Use humidifiers in your home and near your bedside if it helps your breathing.  Drink enough fluids to keep your pee (urine) clear or pale yellow.  Eat healthy foods. Eat smaller meals more often and rest before meals.  Ask your doctor if it is okay to take vitamins or pills with minerals (supplements).  Exercise and stay active.  Rest with activity.  Get into a comfortable position when you have trouble breathing.  Learn and use tips on how to relax.  Learn and use tips on how to control your breathing as told by your doctor. Try:  Breathing in through your nose for 1 second. Then, breath out (exhale) through your puckered (like a whistle) lips for 2 seconds.  Putting one hand on your belly (abdomen). Breathe in slowly through your nose. Your hand on your belly should move out. Breathe out slowly through your puckered lips. Your hand on your belly should move in as you breathe out.  Learn and use controlled coughing to clear thick spit from your lungs. 1. Lean your head slightly  forward. 2. Breathe in deeply. 3. Try to hold your breath for 3 seconds. 4. Keep your mouth slightly open while coughing 2 times. 5. Spit any thick spit out into a tissue. 6. Rest and do the steps again 1 or 2 times as needed.  Get all shots (vaccines) a told by your doctor.  Learn how to manage stress.  Schedule and go to all follow-up doctor visits.  Go to therapy that can help you improve your lungs (pulmonary rehabilitation) as told by your doctor.  Use oxygen at home as told by your doctor. GET HELP RIGHT AWAY IF:   You can feel your heart beating really fast.  You have shortness of breath while resting.  You have shortness of breath that stops you from being able to talk.  You have shortness of breath that stops you from doing normal activities.  You have chest pain lasting longer than 5 minutes.  You start to shake uncontrollably (seizure).  Your family or friends notice that you are flustered or confused.  You cough up more thick spit than usual.  There is a change in the color or thickness of the spit.  Breathing is more difficult than usual.  Your breathing is faster than usual.  Your skin color is more blueish than usual.  You are running out of the medicine you take for breathing.  You are anxious, uneasy, fearful, or restless.  You have a fever. MAKE SURE YOU:   Understand  these instructions.  Will watch your condition.  Will get help right away if you are not doing well or get worse. Document Released: 12/23/2007 Document Revised: 06/22/2012 Document Reviewed: 03/02/2013 [facility] Patient Information 2014 [place], [place].

## 2013-07-05 NOTE — Progress Notes (Signed)
Patient ID: Jacob Joseph, male   DOB: 1955/09/27, 57 y.o.   MRN: [id number]  Subjective:   HPI: [name] is a 57 y.o. with past medical history of hypertension, HIV, infection, depression, history of CVA, and right lower extremity DVT on 04/14/2013 presents to the clinic for routine followup visit and to discuss his PFTs which were performed 06/09/13. FEV1 FVC ratio of 53%. FEV1 of 52.  Last clinic visit 05/31/2013 with me when I ordered a PFTs. No new complaints today but he reports that he continues to be shorter breath with wheezing. He is currently not using any inhalers and has never used an inhaler as before.  Kindly see the A&P for the status of the pt's chronic medical problems.  Past Medical History  Diagnosis Date  . Hypertension   . HIV (human immunodeficiency virus infection)   . Depression   . Stroke 11/2011    Carotids Doppler negative. Right sided weakness resolved, initially on Plavix for 3 months and then continued with ASA.    . TB lung, latent 1988    Treated  . Calcium oxalate crystals in urine 12/23/2011    Asymptomatic, no hematuria. Advised to take plenty of water.   Current Outpatient Prescriptions  Medication Sig Dispense Refill  . albuterol (PROVENTIL HFA;VENTOLIN HFA) 108 (90 BASE) MCG/ACT inhaler Inhale 2 puffs into the lungs every 6 (six) hours as needed for wheezing or shortness of breath.  1 Inhaler  2  . Blood Pressure KIT 1 kit by Does not apply route 2 (two) times daily.  1 each  0  . Darunavir Ethanolate (PREZISTA) 800 MG tablet Take 1 tablet (800 mg total) by mouth daily with breakfast.  30 tablet  11  . elvitegravir-cobicistat-emtricitabine-tenofovir (STRIBILD) 150-150-200-300 MG TABS Take 1 tablet by mouth daily with breakfast.  30 tablet  11  . lisinopril (PRINIVIL) 10 MG tablet Take 1 tablet (10 mg total) by mouth daily.  31 tablet  12  . megestrol (MEGACE ORAL) 40 MG/ML suspension Take 20 mLs (800 mg total) by mouth daily.  240 mL  5  .  meloxicam (MOBIC) 7.5 MG tablet Take 1-2 tablets (7.5-15 mg total) by mouth daily as needed for pain.  60 tablet  1  . metoprolol tartrate (LOPRESSOR) 25 MG tablet Take 1 tablet (25 mg total) by mouth 2 (two) times daily.  60 tablet  2  . mirtazapine (REMERON) 15 MG tablet TAKE 1 TABLET BY MOUTH EVERY NIGHT AT BEDTIME  30 tablet  0  . Nutritional Supplements (ENSURE HIGH PROTEIN) LIQD Take 1 Can by mouth 2 (two) times daily.  60 Can  11  . omeprazole (PRILOSEC) 40 MG capsule Take 1 capsule (40 mg total) by mouth daily.  30 capsule  1  . QUEtiapine (SEROQUEL) 100 MG tablet Take 150 mg by mouth at bedtime.      . vitamin E (VITAMIN E) 400 UNIT capsule Take 1 capsule (400 Units total) by mouth daily.  60 capsule  2  . warfarin (COUMADIN) 5 MG tablet Take as directed by anticoagulation clinic provider.  30 tablet  2  . zolpidem (AMBIEN) 10 MG tablet Take 10 mg by mouth at bedtime as needed. For sleep      . fluconazole (DIFLUCAN) 100 MG tablet Take 1 tablet (100 mg total) by mouth daily.  14 tablet  0  . gabapentin (NEURONTIN) 300 MG capsule Take 1 capsule (300 mg total) by mouth 3 (three) times daily. Day 1: 300 mg  once Day 2: 300 mg twice daily, Day 3 and onward: 300 mg 3 times daily  90 capsule  1   No current facility-administered medications for this visit.   Family History  Problem Relation Age of Onset  . Hypertension Mother   . Hypertension Father   . Cancer Father    History   Social History  . Marital Status: Married    Spouse Name: N/A    Number of Children: N/A  . Years of Education: N/A   Social History Main Topics  . Smoking status: Current Every Day Smoker -- 0.50 packs/day for 41 years    Types: Cigarettes    Last Attempt to Quit: 04/18/2012  . Smokeless tobacco: Never Used     Comment: wants something to stop all together  . Alcohol Use: No  . Drug Use: No     Comment: every 2-3 days prior to stroke in May 2013, stopped using July 2013  . Sexual Activity: Yes     Partners: Female     Comment: declined condoms   Other Topics Concern  . None   Social History Narrative   Pt lives with wife and grandkid in [place].   Has applied for disability and is pending.   Worked in [place] before about 3 years ago.         Review of Systems: Constitutional: Denies fever, chills, diaphoresis, appetite change and fatigue.  Respiratory: reports SOB, DOE, cough with sputum production and wheezing for a long time. No chest tightness or chest pain. Cardiovascular: No chest pain, palpitations and leg swelling.  Gastrointestinal: No abdominal pain, nausea, vomiting, bloody stools Genitourinary: No dysuria, frequency, hematuria, or flank pain.  Psych: No depression symptoms. No SI or SA.   Objective:  Physical Exam: Filed Vitals:   07/05/13 1513  BP: 143/94  Pulse: 108  Temp: 97.8 F (36.6 C)  TempSrc: Oral  Height: 6' (1.829 m)  Weight: 204 lb 6.4 oz (92.715 kg)  SpO2: 98%   General: Well nourished. No acute distress.  HEENT: Normal oral mucosa. MMM.  Lungs: Audible wheezing bilaterally with some mild distress. Air entry is good bilaterally. Heart: RRR; no extra sounds or murmurs  Abdomen: Non-distended, normal BS, soft, nontender; no hepatosplenomegaly  Extremities: Without increased edema. Pulses are present and strong. Back: No visible lesions or other abnormalities. Palpation of the level of L4-L5 reveals tenderness. Paraspinal muscles are not tense. Left straight leg raising sign is positive. No focal neurological deficits. He has good strength in both extremities. Neurologic: Normal EOM,  Alert and oriented x3. No obvious neurologic/cranial nerve deficits.  Assessment & Plan:  I have discussed my assessment and plan  with  my attending in the clinic, Dr. Dalphine Handing as detailed under problem based charting.

## 2013-07-06 NOTE — Progress Notes (Signed)
Case discussed with Dr.Kazibwe at the time of the visit.  We reviewed the resident's history and exam and pertinent patient test results.  I agree with the assessment, diagnosis, and plan of care documented in the resident's note.    

## 2013-07-10 ENCOUNTER — Other Ambulatory Visit: Payer: Self-pay | Admitting: Internal Medicine

## 2013-07-10 ENCOUNTER — Ambulatory Visit: Payer: Medicaid Other

## 2013-07-11 ENCOUNTER — Other Ambulatory Visit: Payer: Self-pay | Admitting: *Deleted

## 2013-07-11 DIAGNOSIS — R634 Abnormal weight loss: Secondary | ICD-10-CM

## 2013-07-11 DIAGNOSIS — G4700 Insomnia, unspecified: Secondary | ICD-10-CM

## 2013-07-11 MED ORDER — MIRTAZAPINE 15 MG PO TABS: 15.0000 mg | ORAL_TABLET | Freq: Every day | ORAL | Status: DC

## 2013-07-11 MED ORDER — MEGESTROL ACETATE 40 MG/ML PO SUSP: 800.0000 mg | Freq: Every day | ORAL | Status: DC

## 2013-07-17 ENCOUNTER — Ambulatory Visit (INDEPENDENT_AMBULATORY_CARE_PROVIDER_SITE_OTHER): Payer: Medicaid Other | Admitting: Pharmacist

## 2013-07-17 DIAGNOSIS — Z8673 Personal history of transient ischemic attack (TIA), and cerebral infarction without residual deficits: Secondary | ICD-10-CM

## 2013-07-17 DIAGNOSIS — I82409 Acute embolism and thrombosis of unspecified deep veins of unspecified lower extremity: Secondary | ICD-10-CM

## 2013-07-17 DIAGNOSIS — Z7901 Long term (current) use of anticoagulants: Secondary | ICD-10-CM

## 2013-07-17 LAB — POCT INR: INR: 2.7

## 2013-07-17 NOTE — Progress Notes (Signed)
Anti-Coagulation Progress Note  Zadok Holaway is a 57 y.o. male who is currently on an anti-coagulation regimen.    RECENT RESULTS: Recent results are below, the most recent result is correlated with a dose of 22.5 mg. per week: Lab Results  Component Value Date   INR 2.70 07/17/2013   INR 2.70 06/19/2013   INR 3.90 06/12/2013    ANTI-COAG DOSE: Anticoagulation Dose Instructions as of 07/17/2013     [name] Tue Wed Thu Fri Sat   New Dose 5 mg 2.5 mg 2.5 mg 2.5 mg 5 mg 2.5 mg 2.5 mg       ANTICOAG SUMMARY: Anticoagulation Episode Summary   Current INR goal 2.0-3.0  Next INR check 08/14/2013  INR from last check 2.70 (07/17/2013)  Weekly max dose   Target end date Indefinite  [facility]  Preferred lab   Send INR reminders to    Indications  History of CVA [V12.54] Warfarin anticoagulation [V58.61]        Comments         ANTICOAG TODAY: Anticoagulation Summary as of 07/17/2013   INR goal 2.0-3.0  Selected INR 2.70 (07/17/2013)  Next INR check 08/14/2013  Target end date Indefinite   Indications  History of CVA [V12.54] Warfarin anticoagulation [V58.61]      Anticoagulation Episode Summary   [facility]   Preferred lab    Send INR reminders to    Comments       PATIENT INSTRUCTIONS: Patient Instructions  Patient instructed to take medications as defined in the Anti-coagulation Track section of this encounter.  Patient instructed to take today's dose.  Patient verbalized understanding of these instructions.       FOLLOW-UP Return in 4 weeks (on 08/14/2013) for Follow up INR at 1400h.  Hulen Luster, III Pharm.D., CACP

## 2013-07-17 NOTE — Patient Instructions (Signed)
Patient instructed to take medications as defined in the Anti-coagulation Track section of this encounter.  Patient instructed to take today's dose.  Patient verbalized understanding of these instructions.    

## 2013-07-20 DIAGNOSIS — E119 Type 2 diabetes mellitus without complications: Secondary | ICD-10-CM

## 2013-07-20 HISTORY — DX: Type 2 diabetes mellitus without complications: E11.9

## 2013-08-08 ENCOUNTER — Encounter: Payer: Self-pay | Admitting: *Deleted

## 2013-08-09 ENCOUNTER — Encounter: Payer: Medicaid Other | Admitting: Internal Medicine

## 2013-08-14 ENCOUNTER — Ambulatory Visit (INDEPENDENT_AMBULATORY_CARE_PROVIDER_SITE_OTHER): Payer: Medicaid Other | Admitting: Pharmacist

## 2013-08-14 DIAGNOSIS — Z7901 Long term (current) use of anticoagulants: Secondary | ICD-10-CM

## 2013-08-14 DIAGNOSIS — Z8673 Personal history of transient ischemic attack (TIA), and cerebral infarction without residual deficits: Secondary | ICD-10-CM

## 2013-08-14 DIAGNOSIS — I82409 Acute embolism and thrombosis of unspecified deep veins of unspecified lower extremity: Secondary | ICD-10-CM

## 2013-08-14 LAB — POCT INR: INR: 3.9

## 2013-08-15 NOTE — Patient Instructions (Signed)
Patient instructed to take medications as defined in the Anti-coagulation Track section of this encounter.  Patient instructed to take today's dose.  Patient verbalized understanding of these instructions.    

## 2013-08-15 NOTE — Progress Notes (Signed)
Anti-Coagulation Progress Note  Jacob Joseph is a 58 y.o. male who is currently on an anti-coagulation regimen.    RECENT RESULTS: Recent results are below, the most recent result is correlated with a dose of 22.5 mg. per week: Lab Results  Component Value Date   INR 3.90 08/14/2013   INR 2.70 07/17/2013   INR 2.70 06/19/2013    ANTI-COAG DOSE: Anticoagulation Dose Instructions as of 08/14/2013     [name] Tue Wed Thu Fri Sat   New Dose 2.5 mg 2.5 mg 2.5 mg 5 mg 2.5 mg 2.5 mg 2.5 mg       ANTICOAG SUMMARY: Anticoagulation Episode Summary   Current INR goal 2.0-3.0  Next INR check 09/04/2013  INR from last check 3.90! (08/14/2013)  Weekly max dose   Target end date Indefinite  [facility]  Preferred lab   Send INR reminders to    Indications  History of CVA [V12.54] Warfarin anticoagulation [V58.61]        Comments         ANTICOAG TODAY: Anticoagulation Summary as of 08/14/2013   INR goal 2.0-3.0  Selected INR 3.90! (08/14/2013)  Next INR check 09/04/2013  Target end date Indefinite   Indications  History of CVA [V12.54] Warfarin anticoagulation [V58.61]      Anticoagulation Episode Summary   [facility]   Preferred lab    Send INR reminders to    Comments       PATIENT INSTRUCTIONS: Patient Instructions  Patient instructed to take medications as defined in the Anti-coagulation Track section of this encounter.  Patient instructed to take today's dose.  Patient verbalized understanding of these instructions.       FOLLOW-UP Return in 3 weeks (on 09/04/2013) for Follow up INR at 10:30AM.  [name], III Pharm.D., CACP

## 2013-08-16 NOTE — Progress Notes (Signed)
  Indication: h/o CVA.  Duration: indefinite.  INR: 2-3 target.  Agree with Dr. Gladstone Pih assessment and plan.

## 2013-08-30 ENCOUNTER — Encounter: Payer: Self-pay | Admitting: Internal Medicine

## 2013-08-30 ENCOUNTER — Encounter: Payer: Medicaid Other | Admitting: Internal Medicine

## 2013-08-30 ENCOUNTER — Ambulatory Visit (INDEPENDENT_AMBULATORY_CARE_PROVIDER_SITE_OTHER): Payer: Medicaid Other | Admitting: Internal Medicine

## 2013-08-30 VITALS — BP 140/84 | HR 103 | Temp 97.5°F | Ht 72.0 in | Wt 212.3 lb

## 2013-08-30 DIAGNOSIS — J449 Chronic obstructive pulmonary disease, unspecified: Secondary | ICD-10-CM

## 2013-08-30 DIAGNOSIS — M5137 Other intervertebral disc degeneration, lumbosacral region: Secondary | ICD-10-CM

## 2013-08-30 DIAGNOSIS — Z7901 Long term (current) use of anticoagulants: Secondary | ICD-10-CM

## 2013-08-30 DIAGNOSIS — G4700 Insomnia, unspecified: Secondary | ICD-10-CM

## 2013-08-30 DIAGNOSIS — F17200 Nicotine dependence, unspecified, uncomplicated: Secondary | ICD-10-CM

## 2013-08-30 DIAGNOSIS — M1990 Unspecified osteoarthritis, unspecified site: Secondary | ICD-10-CM

## 2013-08-30 DIAGNOSIS — B1710 Acute hepatitis C without hepatic coma: Secondary | ICD-10-CM

## 2013-08-30 DIAGNOSIS — Z21 Asymptomatic human immunodeficiency virus [HIV] infection status: Secondary | ICD-10-CM

## 2013-08-30 DIAGNOSIS — B20 Human immunodeficiency virus [HIV] disease: Secondary | ICD-10-CM

## 2013-08-30 MED ORDER — TIOTROPIUM BROMIDE MONOHYDRATE 18 MCG IN CAPS: 18.0000 ug | ORAL_CAPSULE | Freq: Every day | RESPIRATORY_TRACT | Status: DC

## 2013-08-30 MED ORDER — FLUTICASONE-SALMETEROL 100-50 MCG/DOSE IN AEPB: 1.0000 | INHALATION_SPRAY | Freq: Two times a day (BID) | RESPIRATORY_TRACT | Status: DC

## 2013-08-30 MED ORDER — WARFARIN SODIUM 5 MG PO TABS: ORAL_TABLET | ORAL | Status: DC

## 2013-08-30 MED ORDER — MIRTAZAPINE 15 MG PO TABS: 15.0000 mg | ORAL_TABLET | Freq: Every day | ORAL | Status: DC

## 2013-08-30 NOTE — Progress Notes (Signed)
Patient ID: Jacob Joseph, male   DOB: [birth year], 58 y.o.   MRN: [id number]  Subjective:   HPI: Jacob Joseph is a 58 y.o. with past medical history of hypertension, HIV, infection, depression, history of CVA, and right lower extremity DVT on 04/14/2013 presents to the clinic for a followup visit for his COPD and after starting albuterol. PFTs performed 06/09/13: FEV1 FVC ratio of 53%. FEV1 of 52.  Last clinic visit 07/05/2013, he was started on albuterol. He reports that he has not noted any improvement in his shortness of breath. He has been compliant with his albuterol and on his last visit, his inhaler technique was good. He still continues to smoke. He smokes three quarters of a pack per day.  No new complaints today.  Kindly see the A&P for the status of the pt's chronic medical problems.  Past Medical History  Diagnosis Date  . Hypertension   . HIV (human immunodeficiency virus infection)   . Depression   . Stroke 11/2011    Carotids Doppler negative. Right sided weakness resolved, initially on Plavix for 3 months and then continued with ASA.    . TB lung, latent 1988    Treated  . Calcium oxalate crystals in urine 12/23/2011    Asymptomatic, no hematuria. Advised to take plenty of water.   Current Outpatient Prescriptions  Medication Sig Dispense Refill  . albuterol (PROVENTIL HFA;VENTOLIN HFA) 108 (90 BASE) MCG/ACT inhaler Inhale 2 puffs into the lungs every 6 (six) hours as needed for wheezing or shortness of breath.  1 Inhaler  2  . Blood Pressure KIT 1 kit by Does not apply route 2 (two) times daily.  1 each  0  . Darunavir Ethanolate (PREZISTA) 800 MG tablet Take 1 tablet (800 mg total) by mouth daily with breakfast.  30 tablet  11  . elvitegravir-cobicistat-emtricitabine-tenofovir (STRIBILD) 150-150-200-300 MG TABS Take 1 tablet by mouth daily with breakfast.  30 tablet  11  . fluconazole (DIFLUCAN) 100 MG tablet Take 1 tablet (100 mg total) by mouth daily.  14 tablet  0   . Fluticasone-Salmeterol (ADVAIR DISKUS) 100-50 MCG/DOSE AEPB Inhale 1 puff into the lungs 2 (two) times daily.  2 each  2  . gabapentin (NEURONTIN) 300 MG capsule Take 1 capsule (300 mg total) by mouth 3 (three) times daily. Day 1: 300 mg once Day 2: 300 mg twice daily, Day 3 and onward: 300 mg 3 times daily  90 capsule  1  . lisinopril (PRINIVIL) 10 MG tablet Take 1 tablet (10 mg total) by mouth daily.  31 tablet  12  . megestrol (MEGACE ORAL) 40 MG/ML suspension Take 20 mLs (800 mg total) by mouth daily.  240 mL  5  . meloxicam (MOBIC) 7.5 MG tablet Take 1-2 tablets (7.5-15 mg total) by mouth daily as needed for pain.  60 tablet  1  . metoprolol tartrate (LOPRESSOR) 25 MG tablet Take 1 tablet (25 mg total) by mouth 2 (two) times daily.  60 tablet  2  . mirtazapine (REMERON) 15 MG tablet Take 1 tablet (15 mg total) by mouth at bedtime.  30 tablet  6  . Nutritional Supplements (ENSURE HIGH PROTEIN) LIQD Take 1 Can by mouth 2 (two) times daily.  60 Can  11  . omeprazole (PRILOSEC) 40 MG capsule Take 1 capsule (40 mg total) by mouth daily.  30 capsule  1  . QUEtiapine (SEROQUEL) 100 MG tablet Take 150 mg by mouth at bedtime.      [name] Kitchen  tiotropium (SPIRIVA HANDIHALER) 18 MCG inhalation capsule Place 1 capsule (18 mcg total) into inhaler and inhale daily.  30 capsule  2  . vitamin E (VITAMIN E) 400 UNIT capsule Take 1 capsule (400 Units total) by mouth daily.  60 capsule  2  . warfarin (COUMADIN) 5 MG tablet Take as directed by anticoagulation clinic provider.  50 tablet  0  . zolpidem (AMBIEN) 10 MG tablet Take 10 mg by mouth at bedtime as needed. For sleep       No current facility-administered medications for this visit.   Family History  Problem Relation Age of Onset  . Hypertension Mother   . Hypertension Father   . Cancer Father    History   Social History  . Marital Status: Married    Spouse Name: N/A    Number of Children: N/A  . Years of Education: N/A   Social History Main Topics   . Smoking status: Current Every Day Smoker -- 0.50 packs/day for 41 years    Types: Cigarettes    Last Attempt to Quit: 04/18/2012  . Smokeless tobacco: Never Used     Comment: wants something to stop all together  . Alcohol Use: No  . Drug Use: No     Comment: every 2-3 days prior to stroke in May 2013, stopped using July 2013  . Sexual Activity: Yes    Partners: Female     Comment: declined condoms   Other Topics Concern  . None   Social History Narrative   Pt lives with wife and grandkid in [place].   Has applied for disability and is pending.   Worked in [place] before about 3 years ago.         Review of Systems: Constitutional: Denies fever, chills, diaphoresis, appetite change and fatigue.  Respiratory: reports SOB, DOE, cough with sputum production and wheezing for a long time. No chest tightness or chest pain. Cardiovascular: No chest pain, palpitations and leg swelling.  Gastrointestinal: No abdominal pain, nausea, vomiting, bloody stools Genitourinary: No dysuria, frequency, hematuria, or flank pain.  Psych: No depression symptoms. No SI or SA.   Objective:  Physical Exam: Filed Vitals:   08/30/13 1517  BP: 140/84  Pulse: 103  Temp: 97.5 F (36.4 C)  TempSrc: Oral  Height: 6' (1.829 m)  Weight: 212 lb 4.8 oz (96.299 kg)  SpO2: 98%   General: Well nourished. No acute distress.  HEENT: Normal oral mucosa. MMM.  Lungs: Heavy breathing, but without wheezes, bilaterally. Very mild respiratory distress. Air entry is good bilaterally. Heart: RRR; no extra sounds or murmurs  Abdomen: Non-distended, normal BS, soft, nontender; no hepatosplenomegaly  Extremities: Without increased edema. Pulses are present and strong. Back: No visible lesions or other abnormalities. Palpation of the level of L4-L5 reveals tenderness. Paraspinal muscles are not tense. Left straight leg raising sign is positive. No focal neurological deficits. He has good strength in both  extremities. Neurologic: Normal EOM,  Alert and oriented x3. No obvious neurologic/cranial nerve deficits.  Assessment & Plan:  I have discussed my assessment and plan  with  my attending in the clinic, Dr. Ellwood Dense as detailed under problem based charting.

## 2013-08-30 NOTE — Assessment & Plan Note (Signed)
Minimal response to albuterol.  Plan - Will add Spiriva and Advair. - Provided samples and called in prescriptions to pharmacy - cost is likely to an issues even though he has medcaid.  - I will send a message to [facility] (CSW)to see if he can be assisted with some medications discount.  Medication Samples have been provided to the patient.  Drug name:  Advair 100/50 Qty: 1 LOT: [id number]  Exp.Date: 10/2013  The patient has been instructed regarding the correct time, dose, and frequency of taking this medication, including desired effects and most common side effects.   Medication Samples have been provided to the patient.  Drug name: Burna Sis: 1  LOT: [id number]  Exp.Date: 04/2014  The patient has been instructed regarding the correct time, dose, and frequency of taking this medication, including desired effects and most common side effects.   Jacob Joseph 5:37 PM 08/30/2013

## 2013-08-30 NOTE — Patient Instructions (Addendum)
Please start taking Spiriva one puff every morning  Please take Advair 1 puff in morning and another puff in evening  I have sent in another prescription of both spiriva and advair at your pharmacy  Please come back in 1 month and we see how how you are doing

## 2013-08-31 ENCOUNTER — Telehealth: Payer: Self-pay | Admitting: *Deleted

## 2013-08-31 NOTE — Telephone Encounter (Signed)
Fax from [facility] when Advair is taken w/the pt's other meds it can increase the risk of Severe Adrenal Suppession and Iatrogenic Cushing's Syndrome my occur. Please advise.  Thanks

## 2013-08-31 NOTE — Telephone Encounter (Signed)
Noted. We will address when patient comes back to see me again in 4 weeks.

## 2013-09-02 NOTE — Progress Notes (Signed)
Case discussed with Dr.Kazibwe at the time of the visit.  We reviewed the resident's history and exam and pertinent patient test results.  I agree with the assessment, diagnosis, and plan of care documented in the resident's note.    

## 2013-09-04 ENCOUNTER — Ambulatory Visit: Payer: Medicaid Other

## 2013-09-04 ENCOUNTER — Other Ambulatory Visit: Payer: Self-pay | Admitting: Internal Medicine

## 2013-09-06 NOTE — Addendum Note (Signed)
Addended by: Hulan Fray on: 09/06/2013 05:07 PM   Modules accepted: Orders

## 2013-09-11 ENCOUNTER — Ambulatory Visit (INDEPENDENT_AMBULATORY_CARE_PROVIDER_SITE_OTHER): Payer: Medicaid Other | Admitting: Pharmacist

## 2013-09-11 DIAGNOSIS — Z8673 Personal history of transient ischemic attack (TIA), and cerebral infarction without residual deficits: Secondary | ICD-10-CM

## 2013-09-11 DIAGNOSIS — Z7901 Long term (current) use of anticoagulants: Secondary | ICD-10-CM

## 2013-09-11 DIAGNOSIS — I82409 Acute embolism and thrombosis of unspecified deep veins of unspecified lower extremity: Secondary | ICD-10-CM

## 2013-09-11 LAB — POCT INR: INR: 2.8

## 2013-09-11 NOTE — Progress Notes (Signed)
Anti-Coagulation Progress Note  Jacob Joseph is a 58 y.o. male who is currently on an anti-coagulation regimen.    RECENT RESULTS: Recent results are below, the most recent result is correlated with a dose of 20 mg. per week: Lab Results  Component Value Date   INR 2.80 09/11/2013   INR 3.90 08/14/2013   INR 2.70 07/17/2013    ANTI-COAG DOSE: Anticoagulation Dose Instructions as of 09/11/2013     [name] Tue Wed Thu Fri Sat   New Dose 2.5 mg 2.5 mg 2.5 mg 5 mg 2.5 mg 2.5 mg 2.5 mg       ANTICOAG SUMMARY: Anticoagulation Episode Summary   Current INR goal 2.0-3.0  Next INR check 10/02/2013  INR from last check 2.80 (09/11/2013)  Weekly max dose   Target end date Indefinite  [facility]  Preferred lab   Send INR reminders to    Indications  History of CVA [V12.54] Warfarin anticoagulation [V58.61]        Comments         ANTICOAG TODAY: Anticoagulation Summary as of 09/11/2013   INR goal 2.0-3.0  Selected INR 2.80 (09/11/2013)  Next INR check 10/02/2013  Target end date Indefinite   Indications  History of CVA [V12.54] Warfarin anticoagulation [V58.61]      Anticoagulation Episode Summary   [facility]   Preferred lab    Send INR reminders to    Comments       PATIENT INSTRUCTIONS: Patient Instructions  Patient instructed to take medications as defined in the Anti-coagulation Track section of this encounter.  Patient instructed to take today's dose.  Patient verbalized understanding of these instructions.       FOLLOW-UP Return in 3 weeks (on 10/02/2013) for Follow up INR at [place], III Pharm.D., CACP

## 2013-09-11 NOTE — Patient Instructions (Signed)
Patient instructed to take medications as defined in the Anti-coagulation Track section of this encounter.  Patient instructed to take today's dose.  Patient verbalized understanding of these instructions.    

## 2013-09-13 ENCOUNTER — Encounter: Payer: Medicaid Other | Admitting: Internal Medicine

## 2013-09-25 ENCOUNTER — Ambulatory Visit (INDEPENDENT_AMBULATORY_CARE_PROVIDER_SITE_OTHER): Payer: Medicaid Other | Admitting: Pharmacist

## 2013-09-25 DIAGNOSIS — I82409 Acute embolism and thrombosis of unspecified deep veins of unspecified lower extremity: Secondary | ICD-10-CM

## 2013-09-25 DIAGNOSIS — Z7901 Long term (current) use of anticoagulants: Secondary | ICD-10-CM

## 2013-09-25 DIAGNOSIS — Z8673 Personal history of transient ischemic attack (TIA), and cerebral infarction without residual deficits: Secondary | ICD-10-CM

## 2013-09-26 LAB — POCT INR: INR: 3.4

## 2013-09-26 NOTE — Patient Instructions (Signed)
Patient instructed to take medications as defined in the Anti-coagulation Track section of this encounter.  Patient instructed to take today's dose.  Patient verbalized understanding of these instructions.    

## 2013-09-26 NOTE — Progress Notes (Signed)
Anti-Coagulation Progress Note  Jacob Joseph is a 58 y.o. male who is currently on an anti-coagulation regimen.    RECENT RESULTS: Recent results are below, the most recent result is correlated with a dose of 20 mg. per week: Lab Results  Component Value Date   INR 3.40 09/26/2013   INR 2.80 09/11/2013   INR 3.90 08/14/2013    ANTI-COAG DOSE: Anticoagulation Dose Instructions as of 09/25/2013     [name] Tue Wed Thu Fri Sat   New Dose 2.5 mg 2.5 mg 2.5 mg 2.5 mg 2.5 mg 2.5 mg 2.5 mg       ANTICOAG SUMMARY: Anticoagulation Episode Summary   Current INR goal 2.0-3.0  Next INR check 10/09/2013  INR from last check 2.80 (09/11/2013)  Most recent INR 3.40! (09/26/2013)  Weekly max dose   Target end date Indefinite  [facility]  Preferred lab   Send INR reminders to    Indications  History of CVA [V12.54] Warfarin anticoagulation [V58.61]        Comments         ANTICOAG TODAY: Anticoagulation Summary as of 09/25/2013   INR goal 2.0-3.0  Selected INR 2.80 (09/11/2013)  Next INR check 10/09/2013  Target end date Indefinite   Indications  History of CVA [V12.54] Warfarin anticoagulation [V58.61]      Anticoagulation Episode Summary   [facility] check [facility]   Preferred lab    Send INR reminders to    Comments       PATIENT INSTRUCTIONS: Patient Instructions  Patient instructed to take medications as defined in the Anti-coagulation Track section of this encounter.  Patient instructed to take today's dose.  Patient verbalized understanding of these instructions.       FOLLOW-UP Return in 2 weeks (on 10/09/2013) for Follow up INR at 2:30PM.  [name], III Pharm.D., CACP

## 2013-09-27 ENCOUNTER — Encounter: Payer: Medicaid Other | Admitting: Internal Medicine

## 2013-09-28 ENCOUNTER — Inpatient Hospital Stay (HOSPITAL_COMMUNITY)
Admission: EM | Admit: 2013-09-28 | Discharge: 2013-10-02 | DRG: 974 | Disposition: A | Discharge status: Home / Self Care | Payer: Medicaid Other | Attending: Internal Medicine | Admitting: Internal Medicine

## 2013-09-28 ENCOUNTER — Emergency Department (HOSPITAL_COMMUNITY): Payer: Medicaid Other

## 2013-09-28 ENCOUNTER — Other Ambulatory Visit: Payer: Self-pay

## 2013-09-28 ENCOUNTER — Encounter (HOSPITAL_COMMUNITY): Payer: Self-pay | Admitting: Emergency Medicine

## 2013-09-28 DIAGNOSIS — N182 Chronic kidney disease, stage 2 (mild): Secondary | ICD-10-CM | POA: Diagnosis present

## 2013-09-28 DIAGNOSIS — F329 Major depressive disorder, single episode, unspecified: Secondary | ICD-10-CM | POA: Diagnosis present

## 2013-09-28 DIAGNOSIS — N183 Chronic kidney disease, stage 3 unspecified: Secondary | ICD-10-CM | POA: Diagnosis present

## 2013-09-28 DIAGNOSIS — A419 Sepsis, unspecified organism: Secondary | ICD-10-CM

## 2013-09-28 DIAGNOSIS — I10 Essential (primary) hypertension: Secondary | ICD-10-CM

## 2013-09-28 DIAGNOSIS — F17200 Nicotine dependence, unspecified, uncomplicated: Secondary | ICD-10-CM | POA: Diagnosis present

## 2013-09-28 DIAGNOSIS — Z7901 Long term (current) use of anticoagulants: Secondary | ICD-10-CM

## 2013-09-28 DIAGNOSIS — B20 Human immunodeficiency virus [HIV] disease: Secondary | ICD-10-CM | POA: Diagnosis present

## 2013-09-28 DIAGNOSIS — B1920 Unspecified viral hepatitis C without hepatic coma: Secondary | ICD-10-CM | POA: Diagnosis present

## 2013-09-28 DIAGNOSIS — R000 Tachycardia, unspecified: Secondary | ICD-10-CM

## 2013-09-28 DIAGNOSIS — J449 Chronic obstructive pulmonary disease, unspecified: Secondary | ICD-10-CM | POA: Diagnosis present

## 2013-09-28 DIAGNOSIS — Z21 Asymptomatic human immunodeficiency virus [HIV] infection status: Secondary | ICD-10-CM

## 2013-09-28 DIAGNOSIS — Z8673 Personal history of transient ischemic attack (TIA), and cerebral infarction without residual deficits: Secondary | ICD-10-CM

## 2013-09-28 DIAGNOSIS — I498 Other specified cardiac arrhythmias: Secondary | ICD-10-CM | POA: Diagnosis present

## 2013-09-28 DIAGNOSIS — I129 Hypertensive chronic kidney disease with stage 1 through stage 4 chronic kidney disease, or unspecified chronic kidney disease: Secondary | ICD-10-CM | POA: Diagnosis present

## 2013-09-28 DIAGNOSIS — E874 Mixed disorder of acid-base balance: Secondary | ICD-10-CM | POA: Diagnosis present

## 2013-09-28 DIAGNOSIS — J4489 Other specified chronic obstructive pulmonary disease: Secondary | ICD-10-CM | POA: Diagnosis present

## 2013-09-28 DIAGNOSIS — J189 Pneumonia, unspecified organism: Secondary | ICD-10-CM

## 2013-09-28 DIAGNOSIS — Z8249 Family history of ischemic heart disease and other diseases of the circulatory system: Secondary | ICD-10-CM

## 2013-09-28 DIAGNOSIS — Z86718 Personal history of other venous thrombosis and embolism: Secondary | ICD-10-CM

## 2013-09-28 DIAGNOSIS — Z79899 Other long term (current) drug therapy: Secondary | ICD-10-CM

## 2013-09-28 DIAGNOSIS — Z8611 Personal history of tuberculosis: Secondary | ICD-10-CM

## 2013-09-28 DIAGNOSIS — E873 Alkalosis: Secondary | ICD-10-CM

## 2013-09-28 DIAGNOSIS — F3289 Other specified depressive episodes: Secondary | ICD-10-CM | POA: Diagnosis present

## 2013-09-28 HISTORY — DX: Unspecified viral hepatitis C without hepatic coma: B19.20

## 2013-09-28 LAB — URINALYSIS, ROUTINE W REFLEX MICROSCOPIC
BILIRUBIN URINE: NEGATIVE
GLUCOSE, UA: NEGATIVE mg/dL
HGB URINE DIPSTICK: NEGATIVE
Ketones, ur: NEGATIVE mg/dL
Leukocytes, UA: NEGATIVE
Nitrite: NEGATIVE
PH: 6 (ref 5.0–8.0)
Protein, ur: 30 mg/dL — AB
SPECIFIC GRAVITY, URINE: 1.014 (ref 1.005–1.030)
UROBILINOGEN UA: 1 mg/dL (ref 0.0–1.0)

## 2013-09-28 LAB — URINE MICROSCOPIC-ADD ON

## 2013-09-28 LAB — COMPREHENSIVE METABOLIC PANEL WITH GFR
ALT: 64 U/L — ABNORMAL HIGH (ref 0–53)
AST: 57 U/L — ABNORMAL HIGH (ref 0–37)
Albumin: 2.6 g/dL — ABNORMAL LOW (ref 3.5–5.2)
Alkaline Phosphatase: 106 U/L (ref 39–117)
BUN: 15 mg/dL (ref 6–23)
CO2: 14 meq/L — ABNORMAL LOW (ref 19–32)
Calcium: 8.6 mg/dL (ref 8.4–10.5)
Chloride: 110 meq/L (ref 96–112)
Creatinine, Ser: 1.01 mg/dL (ref 0.50–1.35)
GFR calc Af Amer: >90 mL/min
GFR calc non Af Amer: 81 mL/min — ABNORMAL LOW
Glucose, Bld: 142 mg/dL — ABNORMAL HIGH (ref 70–99)
Potassium: 4.3 meq/L (ref 3.7–5.3)
Sodium: 139 meq/L (ref 137–147)
Total Bilirubin: 0.6 mg/dL (ref 0.3–1.2)
Total Protein: 6.8 g/dL (ref 6.0–8.3)

## 2013-09-28 LAB — CBC WITH DIFFERENTIAL/PLATELET
BASOS ABS: 0 10*3/uL (ref 0.0–0.1)
BASOS PCT: 0 % (ref 0–1)
EOS PCT: 0 % (ref 0–5)
Eosinophils Absolute: 0 10*3/uL (ref 0.0–0.7)
HEMATOCRIT: 40.9 % (ref 39.0–52.0)
HEMOGLOBIN: 13.9 g/dL (ref 13.0–17.0)
LYMPHS PCT: 16 % (ref 12–46)
Lymphs Abs: 1.8 10*3/uL (ref 0.7–4.0)
MCH: 31.1 pg (ref 26.0–34.0)
MCHC: 34 g/dL (ref 30.0–36.0)
MCV: 91.5 fL (ref 78.0–100.0)
MONOS PCT: 4 % (ref 3–12)
Monocytes Absolute: 0.5 10*3/uL (ref 0.1–1.0)
NEUTROS ABS: 9 10*3/uL — AB (ref 1.7–7.7)
Neutrophils Relative %: 79 % — ABNORMAL HIGH (ref 43–77)
Platelets: 123 10*3/uL — ABNORMAL LOW (ref 150–400)
RBC: 4.47 MIL/uL (ref 4.22–5.81)
RDW: 14.5 % (ref 11.5–15.5)
WBC: 11.4 10*3/uL — ABNORMAL HIGH (ref 4.0–10.5)

## 2013-09-28 LAB — RAPID URINE DRUG SCREEN, HOSP PERFORMED
Amphetamines: NOT DETECTED
BARBITURATES: NOT DETECTED
Benzodiazepines: NOT DETECTED
Cocaine: NOT DETECTED
Opiates: NOT DETECTED
TETRAHYDROCANNABINOL: NOT DETECTED

## 2013-09-28 LAB — I-STAT ARTERIAL BLOOD GAS, ED
Acid-base deficit: 12 mmol/L — ABNORMAL HIGH (ref 0.0–2.0)
Bicarbonate: 11.2 mEq/L — ABNORMAL LOW (ref 20.0–24.0)
O2 SAT: 91 %
PCO2 ART: 20.9 mmHg — AB (ref 35.0–45.0)
Patient temperature: 98.6
TCO2: 12 mmol/L (ref 0–100)
pH, Arterial: 7.338 — ABNORMAL LOW (ref 7.350–7.450)
pO2, Arterial: 61 mmHg — ABNORMAL LOW (ref 80.0–100.0)

## 2013-09-28 LAB — TSH: TSH: 0.998 u[IU]/mL (ref 0.350–4.500)

## 2013-09-28 LAB — PROTIME-INR
INR: 2.87 — ABNORMAL HIGH (ref 0.00–1.49)
Prothrombin Time: 29.1 seconds — ABNORMAL HIGH (ref 11.6–15.2)

## 2013-09-28 LAB — TROPONIN I: Troponin I: <0.3 ng/mL (ref <0.30)

## 2013-09-28 LAB — LACTIC ACID, PLASMA: LACTIC ACID, VENOUS: 1.8 mmol/L (ref 0.5–2.2)

## 2013-09-28 LAB — SALICYLATE LEVEL: Salicylate Lvl: <2 mg/dL — ABNORMAL LOW (ref 2.8–20.0)

## 2013-09-28 LAB — STREP PNEUMONIAE URINARY ANTIGEN: STREP PNEUMO URINARY ANTIGEN: POSITIVE — AB

## 2013-09-28 LAB — I-STAT CG4 LACTIC ACID, ED: Lactic Acid, Venous: 3.04 mmol/L — ABNORMAL HIGH (ref 0.5–2.2)

## 2013-09-28 LAB — MRSA PCR SCREENING: MRSA BY PCR: NEGATIVE

## 2013-09-28 MED ORDER — IPRATROPIUM-ALBUTEROL 0.5-2.5 (3) MG/3ML IN SOLN
3.0000 mL | RESPIRATORY_TRACT | Status: DC
  Administered 2013-09-28 – 2013-09-29 (×9): 3 mL via RESPIRATORY_TRACT
  Filled 2013-09-28 (×9): qty 3

## 2013-09-28 MED ORDER — AZITHROMYCIN 500 MG IV SOLR
500.0000 mg | INTRAVENOUS | Status: DC
  Administered 2013-09-28 – 2013-10-01 (×4): 500 mg via INTRAVENOUS
  Filled 2013-09-28 (×5): qty 500

## 2013-09-28 MED ORDER — DEXTROSE 5 % IV SOLN
2.0000 g | Freq: Once | INTRAVENOUS | Status: AC
  Administered 2013-09-28: 2 g via INTRAVENOUS

## 2013-09-28 MED ORDER — HEPARIN SODIUM (PORCINE) 5000 UNIT/ML IJ SOLN: 5000.0000 [IU] | Freq: Three times a day (TID) | INTRAMUSCULAR | Status: DC

## 2013-09-28 MED ORDER — WARFARIN SODIUM 2.5 MG PO TABS
2.5000 mg | ORAL_TABLET | Freq: Every day | ORAL | Status: DC
  Administered 2013-09-28 – 2013-10-02 (×5): 2.5 mg via ORAL
  Filled 2013-09-28 (×6): qty 1

## 2013-09-28 MED ORDER — ELVITEG-COBIC-EMTRICIT-TENOFDF 150-150-200-300 MG PO TABS
1.0000 | ORAL_TABLET | Freq: Every day | ORAL | Status: DC
  Administered 2013-09-29 – 2013-10-02 (×4): 1 via ORAL
  Filled 2013-09-28 (×6): qty 1

## 2013-09-28 MED ORDER — ONDANSETRON HCL 4 MG/2ML IJ SOLN: 4.0000 mg | Freq: Four times a day (QID) | INTRAMUSCULAR | Status: DC | PRN

## 2013-09-28 MED ORDER — ONDANSETRON HCL 4 MG PO TABS: 4.0000 mg | ORAL_TABLET | Freq: Four times a day (QID) | ORAL | Status: DC | PRN

## 2013-09-28 MED ORDER — QUETIAPINE FUMARATE 50 MG PO TABS
150.0000 mg | ORAL_TABLET | Freq: Every day | ORAL | Status: DC
  Administered 2013-09-28 – 2013-10-01 (×4): 150 mg via ORAL
  Filled 2013-09-28 (×6): qty 1

## 2013-09-28 MED ORDER — MOMETASONE FURO-FORMOTEROL FUM 100-5 MCG/ACT IN AERO
2.0000 | INHALATION_SPRAY | Freq: Two times a day (BID) | RESPIRATORY_TRACT | Status: DC
  Administered 2013-09-28 – 2013-10-01 (×7): 2 via RESPIRATORY_TRACT
  Filled 2013-09-28 (×2): qty 8.8

## 2013-09-28 MED ORDER — SODIUM CHLORIDE 0.9 % IV BOLUS (SEPSIS)
1000.0000 mL | Freq: Once | INTRAVENOUS | Status: AC
  Administered 2013-09-28: 1000 mL via INTRAVENOUS

## 2013-09-28 MED ORDER — CEFEPIME HCL 1 G IJ SOLR
1.0000 g | Freq: Three times a day (TID) | INTRAMUSCULAR | Status: DC
  Administered 2013-09-28: 1 g via INTRAVENOUS
  Filled 2013-09-28 (×4): qty 1

## 2013-09-28 MED ORDER — SODIUM CHLORIDE 0.9 % IV SOLN
1000.0000 mL | INTRAVENOUS | Status: DC
  Administered 2013-09-28 (×2): 1000 mL via INTRAVENOUS

## 2013-09-28 MED ORDER — VANCOMYCIN HCL IN DEXTROSE 1-5 GM/200ML-% IV SOLN
1000.0000 mg | Freq: Three times a day (TID) | INTRAVENOUS | Status: DC
  Administered 2013-09-28 – 2013-09-30 (×7): 1000 mg via INTRAVENOUS
  Filled 2013-09-28 (×9): qty 200

## 2013-09-28 MED ORDER — WARFARIN - PHARMACIST DOSING INPATIENT: Freq: Every day | Status: DC

## 2013-09-28 MED ORDER — SODIUM CHLORIDE 0.9 % IV SOLN
1000.0000 mL | Freq: Once | INTRAVENOUS | Status: AC
  Administered 2013-09-28: 1000 mL via INTRAVENOUS

## 2013-09-28 MED ORDER — ACETAMINOPHEN 325 MG PO TABS
650.0000 mg | ORAL_TABLET | Freq: Four times a day (QID) | ORAL | Status: DC | PRN
  Administered 2013-09-29 – 2013-10-01 (×3): 650 mg via ORAL
  Filled 2013-09-28 (×3): qty 2

## 2013-09-28 MED ORDER — DARUNAVIR ETHANOLATE 800 MG PO TABS
800.0000 mg | ORAL_TABLET | Freq: Every day | ORAL | Status: DC
  Administered 2013-09-29 – 2013-10-02 (×4): 800 mg via ORAL
  Filled 2013-09-28 (×6): qty 1

## 2013-09-28 MED ORDER — WARFARIN SODIUM 2.5 MG PO TABS
2.5000 mg | ORAL_TABLET | Freq: Every day | ORAL | Status: DC
Start: 2013-09-28 — End: 2013-09-28

## 2013-09-28 MED ORDER — VANCOMYCIN HCL IN DEXTROSE 1-5 GM/200ML-% IV SOLN
1000.0000 mg | Freq: Once | INTRAVENOUS | Status: AC
  Administered 2013-09-28: 1000 mg via INTRAVENOUS
  Filled 2013-09-28: qty 200

## 2013-09-28 MED ORDER — VANCOMYCIN HCL IN DEXTROSE 1-5 GM/200ML-% IV SOLN: 1000.0000 mg | Freq: Three times a day (TID) | INTRAVENOUS | Status: DC

## 2013-09-28 MED ORDER — ACETAMINOPHEN 650 MG RE SUPP: 650.0000 mg | Freq: Four times a day (QID) | RECTAL | Status: DC | PRN

## 2013-09-28 MED ORDER — ACETAMINOPHEN 325 MG PO TABS
650.0000 mg | ORAL_TABLET | Freq: Once | ORAL | Status: AC
  Administered 2013-09-28: 650 mg via ORAL
  Filled 2013-09-28: qty 2

## 2013-09-28 MED ORDER — DEXTROSE 5 % IV SOLN
1.0000 g | INTRAVENOUS | Status: DC
  Administered 2013-09-28 – 2013-10-01 (×4): 1 g via INTRAVENOUS
  Filled 2013-09-28 (×6): qty 10

## 2013-09-28 MED ORDER — ASPIRIN 325 MG PO TABS
325.0000 mg | ORAL_TABLET | Freq: Once | ORAL | Status: AC
  Administered 2013-09-28: 325 mg via ORAL
  Filled 2013-09-28: qty 1

## 2013-09-28 NOTE — Progress Notes (Signed)
ANTICOAGULATION CONSULT NOTE - Initial Consult  Pharmacy Consult for Coumadin Indication: h/o VTE  Allergies  Allergen Reactions  . Statins Other (See Comments)    Elevated liver enzymes.    Patient Measurements: Weight: 210 lb (95.255 kg) Heparin Dosing Weight:    Vital Signs: Temp: 99.6 F (37.6 C) (03/12 0600) Temp src: Oral (03/12 0600) BP: 108/83 mmHg (03/12 0915) Pulse Rate: 124 (03/12 0915)  Labs:  Recent Labs  09/26/13 1647 09/28/13 0505 09/28/13 0906  HGB  --  13.9  --   HCT  --  40.9  --   PLT  --  123*  --   LABPROT  --  29.1*  --   INR 3.40 2.87*  --   CREATININE  --  1.01  --   TROPONINI  --   --  <0.30    The CrCl is unknown because both a height and weight (above a minimum accepted value) are required for this calculation.   Medical History: Past Medical History  Diagnosis Date  . Hypertension   . HIV (human immunodeficiency virus infection)   . Depression   . Stroke 11/2011    Carotids Doppler negative. Right sided weakness resolved, initially on Plavix for 3 months and then continued with ASA.    . TB lung, latent 1988    Treated  . Calcium oxalate crystals in urine 12/23/2011    Asymptomatic, no hematuria. Advised to take plenty of water.  . Hepatitis C     Medications:  Coumadin 2.5mg  daily  Assessment: dyspnea 58 y/o with 042, COPD, and CKD presents with dyspnea, cough, productive sputum, and chills. HR 160's with EMS. Temp 102.2.Started on Vanco/Cefepime for PNA, sepsis. Patient is also to resume home Coumadin for h/o DVT (scheduled to d/c 10/12/13). Admit INR 2.87  Labs: LA 3.04, drug screen negative, Scr 1.01 with CrCl >100, AST/ALT slightly elevated 57/64, WBC 11.4  Goal of Therapy:  INR 2-3 Monitor platelets by anticoagulation protocol: Yes   Plan:  Continue home Coumadin 2.5mg  daily Daily INR Vanco 1g IV q8hr Cefepime 1g IV TID  Ramla Hase S. Alford Highland, PharmD, BCPS Clinical Staff Pharmacist Pager [phone]  Eilene Ghazi Stillinger 09/28/2013,11:33 AM

## 2013-09-28 NOTE — ED Provider Notes (Signed)
CSN: [id number]     Arrival date & time 09/28/13  0407 History   First MD Initiated Contact with Patient 09/28/13 0410     Chief Complaint  Patient presents with  . Shortness of Breath     (Consider location/radiation/quality/duration/timing/severity/associated sxs/prior Treatment) HPI  This a 58 yo male with history of hypertension, HIV, stroke, and blood clot who presents with shortness of breath. Patient reports worsening shortness or breath over the last 3-4 days. He states that he is not getting better with albuterol. He believes that shortness of breath is associated with COPD. Patient reports dry cough. He denies any fevers but does report chills. Per EMS, patient was noted to be tachycardic in the 160s. He was given adenosine. Resultant rhythm noted to be sinus tachycardia. Patient does report history PCP pneumonia. Unsure of his most recent CD4.   Level V caveat for acuity of condition  Past Medical History  Diagnosis Date  . Hypertension   . HIV (human immunodeficiency virus infection)   . Depression   . Stroke 11/2011    Carotids Doppler negative. Right sided weakness resolved, initially on Plavix for 3 months and then continued with ASA.    . TB lung, latent 1988    Treated  . Calcium oxalate crystals in urine 12/23/2011    Asymptomatic, no hematuria. Advised to take plenty of water.  . Hepatitis C    Past Surgical History  Procedure Laterality Date  . Tee without cardioversion  12/07/2011    Procedure: TRANSESOPHAGEAL ECHOCARDIOGRAM (TEE);  Surgeon: Birdie Riddle, MD;  Location: [facility];  Service: Cardiovascular;  Laterality: N/A;  . Inguinal hernia repair  01/16/2012    Procedure: HERNIA REPAIR INGUINAL INCARCERATED;  Surgeon: Zenovia Jarred, MD;  Location: [facility];  Service: General;  Laterality: Right;  . Hernia repair     Family History  Problem Relation Age of Onset  . Hypertension Mother   . Hypertension Father   . Cancer Father    History  Substance  Use Topics  . Smoking status: Current Every Day Smoker -- 0.50 packs/day for 41 years    Types: Cigarettes    Last Attempt to Quit: 04/18/2012  . Smokeless tobacco: Never Used     Comment: wants something to stop all together  . Alcohol Use: No    Review of Systems  Constitutional: Positive for chills. Negative for fever.  Respiratory: Positive for cough and shortness of breath. Negative for chest tightness.   Cardiovascular: Negative.  Negative for chest pain and leg swelling.  Gastrointestinal: Negative.  Negative for nausea, vomiting, abdominal pain and diarrhea.  Genitourinary: Negative.  Negative for dysuria.  Musculoskeletal: Negative for back pain.  Skin: Negative for rash.  Neurological: Negative for headaches.  Psychiatric/Behavioral: Negative for confusion.  All other systems reviewed and are negative.      Allergies  Statins  Home Medications   Current Outpatient Rx  Name  Route  Sig  Dispense  Refill  . albuterol (PROVENTIL HFA;VENTOLIN HFA) 108 (90 BASE) MCG/ACT inhaler   Inhalation   Inhale 2 puffs into the lungs every 6 (six) hours as needed for wheezing or shortness of breath.   1 Inhaler   2   . Darunavir Ethanolate (PREZISTA) 800 MG tablet   Oral   Take 1 tablet (800 mg total) by mouth daily with breakfast.   30 tablet   11   . elvitegravir-cobicistat-emtricitabine-tenofovir (STRIBILD) 150-150-200-300 MG TABS   Oral   Take 1 tablet  by mouth daily with breakfast.   30 tablet   11   . Fluticasone-Salmeterol (ADVAIR DISKUS) 100-50 MCG/DOSE AEPB   Inhalation   Inhale 1 puff into the lungs 2 (two) times daily.   2 each   2   . lisinopril (PRINIVIL) 10 MG tablet   Oral   Take 1 tablet (10 mg total) by mouth daily.   31 tablet   12   . megestrol (MEGACE ORAL) 40 MG/ML suspension   Oral   Take 20 mLs (800 mg total) by mouth daily.   240 mL   5   . metoprolol tartrate (LOPRESSOR) 25 MG tablet   Oral   Take 25 mg by mouth 2 (two) times  daily.         . mirtazapine (REMERON) 15 MG tablet   Oral   Take 15 mg by mouth at bedtime.         . Nutritional Supplements (ENSURE HIGH PROTEIN) LIQD   Oral   Take 1 Can by mouth 2 (two) times daily.   60 Can   11   . QUEtiapine (SEROQUEL) 100 MG tablet   Oral   Take 150 mg by mouth at bedtime.         [name] Kitchen tiotropium (SPIRIVA HANDIHALER) 18 MCG inhalation capsule   Inhalation   Place 1 capsule (18 mcg total) into inhaler and inhale daily.   30 capsule   2   . warfarin (COUMADIN) 5 MG tablet   Oral   Take 2.5 mg by mouth daily.         [name] Kitchen zolpidem (AMBIEN) 10 MG tablet   Oral   Take 10 mg by mouth at bedtime as needed. For sleep          BP 129/87  Pulse 131  Temp(Src) 99.6 F (37.6 C) (Oral)  Resp 18  Wt 210 lb (95.255 kg)  SpO2 96% Physical Exam  Nursing note and vitals reviewed. Constitutional: He is oriented to person, place, and time. No distress.  Ill-appearing, tachypnic  HENT:  Head: Normocephalic and atraumatic.  Mouth/Throat: Oropharynx is clear and moist.  Mucous membranes dry  Eyes: Pupils are equal, round, and reactive to light.  Neck: Neck supple.  Cardiovascular: Regular rhythm and normal heart sounds.   No murmur heard. Tachycardia  Pulmonary/Chest: He is in respiratory distress. He has no wheezes. He has rales.  Increased work of breathing, fair air movement with scattered wheezes, Rales and coarse breath sounds left lower lobe  Abdominal: Soft. Bowel sounds are normal. There is no tenderness. There is no rebound.  Musculoskeletal: He exhibits no edema.  Lymphadenopathy:    He has no cervical adenopathy.  Neurological: He is alert and oriented to person, place, and time.  Skin: Skin is warm and dry. No rash noted.  Psychiatric: He has a normal mood and affect.    ED Course  Procedures (including critical care time)  CRITICAL CARE Performed by: Thayer Jew, F   Total critical care time: 35 min  Critical care time  was exclusive of separately billable procedures and treating other patients.  Critical care was necessary to treat or prevent imminent or life-threatening deterioration.  Critical care was time spent personally by me on the following activities: development of treatment plan with patient and/or surrogate as well as nursing, discussions with consultants, evaluation of patient's response to treatment, examination of patient, obtaining history from patient or surrogate, ordering and performing treatments and interventions, ordering and review of  laboratory studies, ordering and review of radiographic studies, pulse oximetry and re-evaluation of patient's condition.  Labs Review Labs Reviewed  CBC WITH DIFFERENTIAL - Abnormal; Notable for the following:    WBC 11.4 (*)    Platelets 123 (*)    Neutrophils Relative % 79 (*)    Neutro Abs 9.0 (*)    All other components within normal limits  PROTIME-INR - Abnormal; Notable for the following:    Prothrombin Time 29.1 (*)    INR 2.87 (*)    All other components within normal limits  URINALYSIS, ROUTINE W REFLEX MICROSCOPIC - Abnormal; Notable for the following:    Color, Urine AMBER (*)    Protein, ur 30 (*)    All other components within normal limits  COMPREHENSIVE METABOLIC PANEL - Abnormal; Notable for the following:    CO2 14 (*)    Glucose, Bld 142 (*)    Albumin 2.6 (*)    AST 57 (*)    ALT 64 (*)    GFR calc non Af Amer 81 (*)    All other components within normal limits  URINE MICROSCOPIC-ADD ON - Abnormal; Notable for the following:    Casts HYALINE CASTS (*)    All other components within normal limits  I-STAT ARTERIAL BLOOD GAS, ED - Abnormal; Notable for the following:    pH, Arterial 7.338 (*)    pCO2 arterial 20.9 (*)    pO2, Arterial 61.0 (*)    Bicarbonate 11.2 (*)    Acid-base deficit 12.0 (*)    All other components within normal limits  I-STAT CG4 LACTIC ACID, ED - Abnormal; Notable for the following:    Lactic  Acid, Venous 3.04 (*)    All other components within normal limits  CULTURE, BLOOD (ROUTINE X 2)  CULTURE, BLOOD (ROUTINE X 2)  URINE CULTURE   Imaging Review Dg Chest Port 1 View  09/28/2013   CLINICAL DATA Chest pain, shortness of breath  EXAM PORTABLE CHEST - 1 VIEW  COMPARISON 04/14/2013  FINDINGS Consolidation within the lingula and to a lesser extent left lower lobe. Mild right infrahilar opacity as well. Heart size upper normal. Mediastinal contours within normal range. No pleural effusion or pneumothorax. No acute osseous finding.  IMPRESSION Lingular consolidation may reflect pneumonia.  Mild lung base opacities; atelectasis versus infiltrate.  SIGNATURE  Electronically Signed   By: Carlos Levering M.D.   On: 09/28/2013 05:21     EKG Interpretation None     EKG independently reviewed by myself:  Sinus tachycardia with a rate of 149, no evidence of acute ST elevation or ischemia   Medications  0.9 %  sodium chloride infusion (0 mLs Intravenous Stopped 09/28/13 0528)    Followed by  0.9 %  sodium chloride infusion (1,000 mLs Intravenous New Bag/Given 09/28/13 0437)  sodium chloride 0.9 % bolus 1,000 mL (0 mLs Intravenous Stopped 09/28/13 0528)  ceFEPIme (MAXIPIME) 2 g in dextrose 5 % 50 mL IVPB (0 g Intravenous Stopped 09/28/13 0514)  vancomycin (VANCOCIN) IVPB 1000 mg/200 mL premix (0 mg Intravenous Stopped 09/28/13 0532)  acetaminophen (TYLENOL) tablet 650 mg (650 mg Oral Given 09/28/13 0511)  sodium chloride 0.9 % bolus 1,000 mL (1,000 mLs Intravenous New Bag/Given 09/28/13 0556)    MDM   Final diagnoses:  Sepsis  Community acquired pneumonia    Patient presents with shortness of breath. Noted to be tachycardic and febrile. Level II sepsis alert initiated. No risk factors for health care pneumonia; however, patient  has HIV and is immunosuppressed. Given clinical picture we'll give cefepime and vancomycin. Last CD4 count greater than 500.  Blood cultures obtained. Workup  notable for a lactate of 3.04 and a white count of 11.4. ABG shows evidence of metabolic acidosis with compensatory alkalosis.  Patient was given 2 L of normal saline with heart rate now in the 120s. Chest x-ray notable for left lower lobe pneumonia.  Third liter of fluid ordered. Patient will be admitted to the internal medicine service.  Merryl Hacker, MD 09/28/13 [phone]

## 2013-09-28 NOTE — ED Notes (Signed)
Pt. arrived with EMS from home reports worsening SOB with elevated heart rate 160's this morning , denies chest pain , pt. received Adenosine 6 mg  anf 12 mg IV by EMS ,alert and oriented at arrival .

## 2013-09-28 NOTE — ED Notes (Signed)
Pt prepped for transport on [facility] w/ o2 w/ nurse pt aaox4

## 2013-09-28 NOTE — ED Notes (Signed)
Pharm notified for clarification of  Antibiotic orders

## 2013-09-28 NOTE — ED Notes (Signed)
Admit dr into see pt pt sob with movement and talking cannot speak full sentences wife in room

## 2013-09-28 NOTE — H&P (Addendum)
Internal Medicine Attending Admission Note Date: 09/28/2013  Patient name: Jacob Joseph Medical record number: [id number] Date of birth: [birth year] Age: 58 y.o. Gender: male  I saw and evaluated the patient. I reviewed the resident's note and I agree with the resident's findings and plan as documented in the resident's note, with the following additional comments.  Chief Complaint(s): Shortness of breath  History - key components related to admission: Patient is a 58 year old man with history of HIV (last CD4 630 on 04/04/2013), COPD, chronic kidney disease, hypertension, DVT, and other problems as outlined in medical history, admitted with complaint of shortness of breath and cough productive of yellow sputum.  Physical Exam - key components related to admission:  Filed Vitals:   09/28/13 1130 09/28/13 1320 09/28/13 1415 09/28/13 1553  BP: 115/78     Pulse: 109     Temp:  97.9 F (36.6 C)    TempSrc:  Oral    Resp: 24     Height:  6' (1.829 m)    Weight:  212 lb 15.4 oz (96.6 kg)    SpO2: 97% 98% 99% 99%   General: Alert, oriented Lungs: Crackles in left lower lung field, few right basilar crackles Heart: Regular, no S3, no S4, no murmurs Abdomen: Bowel sounds present, soft, nontender Extremities: No  Lab results:   Basic Metabolic Panel:  Recent Labs  09/28/13 0505  NA 139  K 4.3  CL 110  CO2 14*  GLUCOSE 142*  BUN 15  CREATININE 1.01  CALCIUM 8.6    Liver Function Tests:  Recent Labs  09/28/13 0505  AST 57*  ALT 64*  ALKPHOS 106  BILITOT 0.6  PROT 6.8  ALBUMIN 2.6*     CBC:  Recent Labs  09/28/13 0505  WBC 11.4*  HGB 13.9  HCT 40.9  MCV 91.5  PLT 123*    Recent Labs  09/28/13 0505  NEUTROABS 9.0*  LYMPHSABS 1.8  MONOABS 0.5  EOSABS 0.0  BASOSABS 0.0    Cardiac Enzymes:  Recent Labs  09/28/13 0906  TROPONINI <0.30    Thyroid Function Tests:  Recent Labs  09/28/13 0906  TSH 0.998     Coagulation:  Recent Labs  09/26/13 1647 09/28/13 0505  INR 3.40 2.87*    Urine Drug Screen: Drugs of Abuse     Component Value Date/Time   LABOPIA NONE DETECTED 09/28/2013 0850   COCAINSCRNUR NONE DETECTED 09/28/2013 0850   LABBENZ NONE DETECTED 09/28/2013 0850   AMPHETMU NONE DETECTED 09/28/2013 0850   THCU NONE DETECTED 09/28/2013 0850   LABBARB NONE DETECTED 09/28/2013 0850      Urinalysis    Component Value Date/Time   COLORURINE AMBER* 09/28/2013 0539   APPEARANCEUR CLEAR 09/28/2013 0539   LABSPEC 1.014 09/28/2013 0539   PHURINE 6.0 09/28/2013 0539   GLUCOSEU NEGATIVE 09/28/2013 0539   HGBUR NEGATIVE 09/28/2013 0539   HGBUR negative 12/25/2008 1206   BILIRUBINUR NEGATIVE 09/28/2013 0539   KETONESUR NEGATIVE 09/28/2013 0539   PROTEINUR 30* 09/28/2013 0539   UROBILINOGEN 1.0 09/28/2013 0539   NITRITE NEGATIVE 09/28/2013 0539   LEUKOCYTESUR NEGATIVE 09/28/2013 0539    Urine microscopic:  Recent Labs  09/28/13 0539  EPIU RARE  WBCU 0-2  RBCU 0-2  BACTERIA RARE  LABCAST HYALINE CASTS*   ABG    Component Value Date/Time   PHART 7.338* 09/28/2013 0458   PCO2ART 20.9* 09/28/2013 0458   PO2ART 61.0* 09/28/2013 0458   HCO3 11.2* 09/28/2013 0458   TCO2 12 09/28/2013  0458   ACIDBASEDEF 12.0* 09/28/2013 0458   O2SAT 91.0 09/28/2013 0458     Strep Pneumo Urinary Antigen  Date Value Ref Range Status  09/28/2013 POSITIVE* NEGATIVE Final      Imaging results:  Dg Chest Port 1 View  09/28/2013   CLINICAL DATA Chest pain, shortness of breath  EXAM PORTABLE CHEST - 1 VIEW  COMPARISON 04/14/2013  FINDINGS Consolidation within the lingula and to a lesser extent left lower lobe. Mild right infrahilar opacity as well. Heart size upper normal. Mediastinal contours within normal range. No pleural effusion or pneumothorax. No acute osseous finding.  IMPRESSION Lingular consolidation may reflect pneumonia.  Mild lung base opacities; atelectasis versus infiltrate.  SIGNATURE  Electronically Signed   By: Carlos Levering M.D.    On: 09/28/2013 05:21    Other results: EKG: Sinus tachycardia   Assessment & Plan by Problem:  1. Community-acquired pneumonia with sepsis.  Patient presented with dyspnea, fever, tachycardia, tachypnea, mild lactic acidosis, and chest x-ray consistent with pneumonia.  His tachycardia has improved with volume resuscitation, and his blood pressure is stable.  His oxygen saturations are good on 2 L nasal cannula O2.  The plan is empiric IV antibiotics pending results of blood and sputum cultures (would add azithromycin for atypical coverage); supplement oxygen and follow saturations, close monitoring and step down unit.  2.  COPD.  Patient has no wheezing on lung exam.  The plan is treat pneumonia as above; inhaled bronchodilators; supplement oxygen and follow saturations.  3.  HIV.  Patient is followed in the ID clinic, and his HIV appears to be under good control.  Plan is to continue home regimen.  4.  DVT.  INR is therapeutic; plan is continue warfarin with dosing per pharmacy.  5.  Other problems and plans as per the resident physician's note.

## 2013-09-28 NOTE — ED Notes (Signed)
Admit dr into see pt again

## 2013-09-28 NOTE — ED Notes (Signed)
Pt repositioned in bed and assisted in using urinal

## 2013-09-28 NOTE — Progress Notes (Signed)
ANTIBIOTIC CONSULT NOTE - INITIAL  Pharmacy Consult for Vancomycin and Cefepme Indication: r/o sepsis Allergies  Allergen Reactions  . Statins Other (See Comments)    Elevated liver enzymes.    Patient Measurements: Weight: 210 lb (95.255 kg)  Vital Signs: Temp: 99.6 F (37.6 C) (03/12 0600) Temp src: Oral (03/12 0600) BP: 114/74 mmHg (03/12 0646) Pulse Rate: 132 (03/12 0646) Intake/Output from previous day: 03/11 0701 - 03/12 0700 In: 3000 [I.V.:3000] Out: -  Intake/Output from this shift:    Labs:  Recent Labs  09/28/13 0505  WBC 11.4*  HGB 13.9  PLT 123*  CREATININE 1.01   The CrCl is unknown because both a height and weight (above a minimum accepted value) are required for this calculation. No results found for this basename: VANCOTROUGH, VANCOPEAK, VANCORANDOM, GENTTROUGH, GENTPEAK, GENTRANDOM, TOBRATROUGH, TOBRAPEAK, TOBRARND, AMIKACINPEAK, AMIKACINTROU, AMIKACIN,  in the last 72 hours   Microbiology: No results found for this or any previous visit (from the past 720 hour(s)).  Medical History: Past Medical History  Diagnosis Date  . Hypertension   . HIV (human immunodeficiency virus infection)   . Depression   . Stroke 11/2011    Carotids Doppler negative. Right sided weakness resolved, initially on Plavix for 3 months and then continued with ASA.    . TB lung, latent 1988    Treated  . Calcium oxalate crystals in urine 12/23/2011    Asymptomatic, no hematuria. Advised to take plenty of water.  . Hepatitis C     Medications:  ALbuterol  Prezista  Stribild  Advair  Zestril  Megace  Lopressor  Remeron  Seroquel  Spiriva  Ambien Coumadin 2.5 mg daily  Assessment: 58 yo male with SOB/fever, possible sepsis, for empiric antibiotics.  Vancomycin 1 g IV given in ED at 0445  Goal of Therapy:  Vancomycin trough level 15-20 mcg/ml  Plan:  Vancomycin 1 g IV q8h Cefepime 1 g IV q8h  Caryl Pina 09/28/2013,7:06 AM

## 2013-09-28 NOTE — Progress Notes (Signed)
Pt arrived on stretcher from ED to room 2C04. Oriented to room and safety policy. CHG bath done and MRSA swab. Placed in bed attached all monitors, notified E-link and CMT of pt's arrival. ED says wife went home. Pt alert and able to answer all admission screening and questions.

## 2013-09-28 NOTE — ED Notes (Addendum)
Lactic Acid reported to Dr.Horton

## 2013-09-28 NOTE — H&P (Signed)
Date: 09/28/2013               Patient Name:  Jacob Joseph MRN: [id number]  DOB: 1956-04-26 Age / Sex: 58 y.o., male   PCP: Jessee Avers, MD         Medical Service: Internal Medicine Teaching Service         Attending Physician: Dr. Axel Filler, MD    First Contact: Dr. Stann Mainland Pager: [phone]  Second Contact: Dr. Algis Liming Pager: [phone]       After Hours (After 5p/  First Contact Pager: [phone]  weekends / holidays): Second Contact Pager: [phone]   Chief Complaint: dyspnea  History of Present Illness: The patient is a 58 yo man, history of HIV (CD4 = 630), COPD, CKD, presenting with dypsnea.  The patient notes a 3-4 day history of dyspnea, worse with exertion though present at rest, associated with cough productive of yellow sputum, and chills.  He has been using his albuterol inhaler more frequently, though with no relief of symptoms.  He notes that his wife had a "cold" about 1 week ago, which has since resolved.  He notes no myalgias, rhinorrhea, sore throat, or chest pain.  On EMS arrival, his HR was found to be in the 160's, and he was given adenosine x2 (6mg , 12mg ), without much improvement.  In the ED, he was given 3 L NS boluses, and started on vanc/cefepime for a likely pneumonia seen on CXR.  Meds: Current Facility-Administered Medications  Medication Dose Route Frequency Provider Last Rate Last Dose  . 0.9 %  sodium chloride infusion  1,000 mL Intravenous Continuous Merryl Hacker, MD 125 mL/hr at 09/28/13 0437 1,000 mL at 09/28/13 0437  . ceFEPIme (MAXIPIME) 1 g in dextrose 5 % 50 mL IVPB  1 g Intravenous 3 times per day Axel Filler, MD      . vancomycin (VANCOCIN) IVPB 1000 mg/200 mL premix  1,000 mg Intravenous Q8H Axel Filler, MD       Current Outpatient Prescriptions  Medication Sig Dispense Refill  . albuterol (PROVENTIL HFA;VENTOLIN HFA) 108 (90 BASE) MCG/ACT inhaler Inhale 2 puffs into the lungs every 6 (six) hours as needed for wheezing or  shortness of breath.  1 Inhaler  2  . Darunavir Ethanolate (PREZISTA) 800 MG tablet Take 1 tablet (800 mg total) by mouth daily with breakfast.  30 tablet  11  . elvitegravir-cobicistat-emtricitabine-tenofovir (STRIBILD) 150-150-200-300 MG TABS Take 1 tablet by mouth daily with breakfast.  30 tablet  11  . Fluticasone-Salmeterol (ADVAIR DISKUS) 100-50 MCG/DOSE AEPB Inhale 1 puff into the lungs 2 (two) times daily.  2 each  2  . lisinopril (PRINIVIL) 10 MG tablet Take 1 tablet (10 mg total) by mouth daily.  31 tablet  12  . megestrol (MEGACE ORAL) 40 MG/ML suspension Take 20 mLs (800 mg total) by mouth daily.  240 mL  5  . metoprolol tartrate (LOPRESSOR) 25 MG tablet Take 25 mg by mouth 2 (two) times daily.      . mirtazapine (REMERON) 15 MG tablet Take 15 mg by mouth at bedtime.      . Nutritional Supplements (ENSURE HIGH PROTEIN) LIQD Take 1 Can by mouth 2 (two) times daily.  60 Can  11  . QUEtiapine (SEROQUEL) 100 MG tablet Take 150 mg by mouth at bedtime.      [name] Kitchen tiotropium (SPIRIVA HANDIHALER) 18 MCG inhalation capsule Place 1 capsule (18 mcg total) into inhaler and inhale daily.  Rachel  capsule  2  . warfarin (COUMADIN) 5 MG tablet Take 2.5 mg by mouth daily.      [name] Kitchen zolpidem (AMBIEN) 10 MG tablet Take 10 mg by mouth at bedtime as needed. For sleep        Allergies: Allergies as of 09/28/2013 - Review Complete 09/28/2013  Allergen Reaction Noted  . Statins Other (See Comments) 03/14/2012   Past Medical History  Diagnosis Date  . Hypertension   . HIV (human immunodeficiency virus infection)   . Depression   . Stroke 11/2011    Carotids Doppler negative. Right sided weakness resolved, initially on Plavix for 3 months and then continued with ASA.    . TB lung, latent 1988    Treated  . Calcium oxalate crystals in urine 12/23/2011    Asymptomatic, no hematuria. Advised to take plenty of water.  . Hepatitis C    Past Surgical History  Procedure Laterality Date  . Tee without cardioversion   12/07/2011    Procedure: TRANSESOPHAGEAL ECHOCARDIOGRAM (TEE);  Surgeon: Birdie Riddle, MD;  Location: [facility];  Service: Cardiovascular;  Laterality: N/A;  . Inguinal hernia repair  01/16/2012    Procedure: HERNIA REPAIR INGUINAL INCARCERATED;  Surgeon: Zenovia Jarred, MD;  Location: [facility];  Service: General;  Laterality: Right;  . Hernia repair     Family History  Problem Relation Age of Onset  . Hypertension Mother   . Hypertension Father   . Cancer Father    History   Social History  . Marital Status: Married    Spouse Name: N/A    Number of Children: N/A  . Years of Education: N/A   Occupational History  . Not on file.   Social History Main Topics  . Smoking status: Current Every Day Smoker -- 0.50 packs/day for 41 years    Types: Cigarettes    Last Attempt to Quit: 04/18/2012  . Smokeless tobacco: Never Used     Comment: wants something to stop all together  . Alcohol Use: No  . Drug Use: No     Comment: every 2-3 days prior to stroke in May 2013, stopped using July 2013  . Sexual Activity: Yes    Partners: Female     Comment: declined condoms   Other Topics Concern  . Not on file   Social History Narrative   Pt lives with wife and grandkid in [place].   Has applied for disability and is pending.   Worked in [place] before about 3 years ago.          Review of Systems: General: no changes in weight, changes in appetite Skin: no rash HEENT: no blurry vision, hearing changes, sore throat Pulm: see HPI CV: no chest pain, palpitations Abd: no abdominal pain, nausea/vomiting, diarrhea/constipation GU: no dysuria, hematuria, polyuria Ext: no arthralgias, myalgias Neuro: no weakness, numbness, or tingling  Physical Exam: Tmax: 102.2 (rectal) Blood pressure 84/53, pulse 130, temperature 99.6 F (37.6 C), temperature source Oral, resp. rate 27, weight 210 lb (95.255 kg), SpO2 91.00%. General: sitting up in bed, appears uncomfortable, speaking few  words at a time HEENT: pupils equal round and reactive to light, vision grossly intact, oropharynx clear and non-erythematous  Neck: supple Lungs: ronchi heard bilaterally, L > R, with no wheeze or crackles Heart: tachycardic, regular rhythm, no m/g/r Abdomen: round, non-tender, non-distended, normal bowel sounds Extremities: no cyanosis, clubbing, or edema Neurologic: alert & oriented X3, cranial nerves II-XII intact, strength grossly intact, sensation intact to light  touch  Lab results: Basic Metabolic Panel:  Recent Labs  09/28/13 0505  NA 139  K 4.3  CL 110  CO2 14*  GLUCOSE 142*  BUN 15  CREATININE 1.01  CALCIUM 8.6   Liver Function Tests:  Recent Labs  09/28/13 0505  AST 57*  ALT 64*  ALKPHOS 106  BILITOT 0.6  PROT 6.8  ALBUMIN 2.6*   CBC:  Recent Labs  09/28/13 0505  WBC 11.4*  NEUTROABS 9.0*  HGB 13.9  HCT 40.9  MCV 91.5  PLT 123*   Coagulation:  Recent Labs  09/26/13 1647 09/28/13 0505  LABPROT  --  29.1*  INR 3.40 2.87*   Urinalysis:  Recent Labs  09/28/13 0539  COLORURINE AMBER*  LABSPEC 1.014  PHURINE 6.0  GLUCOSEU NEGATIVE  HGBUR NEGATIVE  BILIRUBINUR NEGATIVE  KETONESUR NEGATIVE  PROTEINUR 30*  UROBILINOGEN 1.0  NITRITE NEGATIVE  LEUKOCYTESUR NEGATIVE  WBC: 0-2 RBC: 0-2  ABG: 7.34/20.9/61/11  Imaging results:  Dg Chest Port 1 View  09/28/2013   CLINICAL DATA Chest pain, shortness of breath  EXAM PORTABLE CHEST - 1 VIEW  COMPARISON 04/14/2013  FINDINGS Consolidation within the lingula and to a lesser extent left lower lobe. Mild right infrahilar opacity as well. Heart size upper normal. Mediastinal contours within normal range. No pleural effusion or pneumothorax. No acute osseous finding.  IMPRESSION Lingular consolidation may reflect pneumonia.  Mild lung base opacities; atelectasis versus infiltrate.  SIGNATURE  Electronically Signed   By: Carlos Levering M.D.   On: 09/28/2013 05:21    Other results: EKG: sinus  tachycardia, poor R wave progression  Assessment & Plan by Problem: # Sepsis 2/2 CAP - The patient presents with fever (102.2), tachycardia, and tachypnea, with lingula/LLL pneumonia seen on CXR, consistent with CAP.  Lactic acid is elevated to 3.0, and BP is in the 100/50's, but patient is maintaining O2 sats on [place], and mental status is intact.  COPD exacerbation is less likely; although the patient does meet the cardinal criteria, these can also be explained by CAP, and the patient has no wheezing on exam, with pCO2 at 20.9.  Unlikely flu, given lack of myalgias, and evidence of infection on CXR.  Unlikely PE given that patient is therapeutic on warfarin. -admit to stepdown for now -pt currently is not in septic shock (BP stable in SBP 100's, lactic acid only mildly elevated, no other end-organ hypoperfusion), but we will monitor the patient closely to ensure he is improving over the next few hours, and consider ICU admission if improvement is not seen -continue vanc/cefepime for now -check blood cultures, urine strep/legionella -trend lactic acid -duonebs q4 -no indication for steroids at this time  # AG acidosis with respiratory alkalosis - The patient presents with an AG = 15, and bicarb of 14, consistent with an elevated anion gap acidosis, likely due to lactic acidosis (lactic acid = 3,).  Additionally, the patient's ABG shows pH 7.34, and pCO2 = 20.9.  Per Winter's formula, the expected pCO2 should be 27-31, indicating a co-existing respiratory alkalosis.  The differential for this alkalosis includes salicylate toxicity, hyperventilation (due to anxiety, hypoxia). -check salicylate level -continue O2 to keep sats > 92% -trend lactic acid  # Sinus Tachycardia - the patient presents with HR as high as 160, reported by EMS, now down to 130's after NS boluses.  During his last 4 office visits, HR has been in the 90-100's.  The differential includes sepsis vs hyperthyroidism vs substance abuse  (cocaine  positive in 2013) vs hypovolemia vs anxiety (given acute illness).  Unlikely ACS causing new-onset tachyarrythmia, given lack of chest pain. -check TSH, UDS -treat sepsis per above -IVF per above -check troponin x1 -recheck EKG, now that HR has improved  # HTN - history of HTN, on home lisinopril, metoprolol -hold home meds for now, in the setting of sepsis  # HIV - last CD4 = 630 (04/04/13).  Given such a high CD4, opportunistic infections as a cause of his pneumonia are unlikely. -continue prezista, stribild  # COPD - chronic -continue dulera, nebs  # history of DVT - unprovoked DVT 04/14/13, scheduled to stop warfarin 10/12/13.  INR = 2.8 -continue warfarin, per pharmacy consult given concurrent antibiotic usage  # Prophy - heparin  Dispo: Disposition is deferred at this time, awaiting improvement of current medical problems. Anticipated discharge in approximately 2-3 day(s).   The patient does have a current PCP Jessee Avers, MD) and does need an [facility] hospital follow-up appointment after discharge.  Signed: Hester Mates, MD 09/28/2013, 7:54 AM

## 2013-09-29 DIAGNOSIS — R002 Palpitations: Secondary | ICD-10-CM

## 2013-09-29 LAB — BASIC METABOLIC PANEL
BUN: 11 mg/dL (ref 6–23)
CALCIUM: 8 mg/dL — AB (ref 8.4–10.5)
CO2: 15 mEq/L — ABNORMAL LOW (ref 19–32)
CREATININE: 0.92 mg/dL (ref 0.50–1.35)
Chloride: 108 mEq/L (ref 96–112)
GFR calc Af Amer: >90 mL/min (ref >90)
GFR calc non Af Amer: >90 mL/min (ref >90)
Glucose, Bld: 151 mg/dL — ABNORMAL HIGH (ref 70–99)
Potassium: 4 mEq/L (ref 3.7–5.3)
Sodium: 139 mEq/L (ref 137–147)

## 2013-09-29 LAB — LEGIONELLA ANTIGEN, URINE: Legionella Antigen, Urine: NEGATIVE

## 2013-09-29 LAB — URINE CULTURE
COLONY COUNT: NO GROWTH
Culture: NO GROWTH

## 2013-09-29 LAB — EXPECTORATED SPUTUM ASSESSMENT W REFEX TO RESP CULTURE

## 2013-09-29 LAB — EXPECTORATED SPUTUM ASSESSMENT W GRAM STAIN, RFLX TO RESP C

## 2013-09-29 LAB — PROTIME-INR
INR: 2.69 — ABNORMAL HIGH (ref 0.00–1.49)
PROTHROMBIN TIME: 27.7 s — AB (ref 11.6–15.2)

## 2013-09-29 MED ORDER — IPRATROPIUM-ALBUTEROL 0.5-2.5 (3) MG/3ML IN SOLN
3.0000 mL | Freq: Four times a day (QID) | RESPIRATORY_TRACT | Status: DC
  Administered 2013-09-30 – 2013-10-01 (×8): 3 mL via RESPIRATORY_TRACT
  Filled 2013-09-29 (×8): qty 3

## 2013-09-29 MED ORDER — SODIUM CHLORIDE 0.9 % IV SOLN
1000.0000 mL | INTRAVENOUS | Status: DC
  Administered 2013-09-30 – 2013-10-01 (×3): 1000 mL via INTRAVENOUS

## 2013-09-29 MED ORDER — ALBUTEROL SULFATE (2.5 MG/3ML) 0.083% IN NEBU
2.5000 mg | INHALATION_SOLUTION | RESPIRATORY_TRACT | Status: DC | PRN
  Administered 2013-10-02: 2.5 mg via RESPIRATORY_TRACT
  Filled 2013-09-29: qty 3

## 2013-09-29 NOTE — Progress Notes (Signed)
Subjective: [name] is doing better this morning, no complaints.  Was up in chair for 2 hours this morning.   Still tachycardic to 100s, will obtain orthostatics.  Objective: Vital signs in last 24 hours: Filed Vitals:   09/29/13 0740 09/29/13 0805 09/29/13 1207 09/29/13 1208  BP: 111/68   137/78  Pulse: 98   104  Temp: 98.4 F (36.9 C)   99 F (37.2 C)  TempSrc: Oral   Oral  Resp: 18   19  Height:      Weight:      SpO2: 99% 100% 100% 98%   Weight change: 2 lb 15.4 oz (1.345 kg)  Intake/Output Summary (Last 24 hours) at 09/29/13 1524 Last data filed at 09/29/13 1500  Gross per 24 hour  Intake   4360 ml  Output   2925 ml  Net   1435 ml   PEX General: alert, cooperative, and in no apparent distress HEENT: NCAT, vision grossly intact, oropharynx clear and non-erythematous  Neck: supple, no lymphadenopathy Lungs: diffuse rhonchi L>R, normal work of respiration, no wheezes, rales Heart: regular rate and rhythm, no murmurs, gallops, or rubs Abdomen: soft, non-tender, non-distended, normal bowel sounds Extremities: 2+ DP/PT pulses bilaterally, no cyanosis, clubbing, or edema Neurologic: alert & oriented X3, cranial nerves II-XII intact, strength grossly intact, sensation intact to light touch  Lab Results: Basic Metabolic Panel:  Recent Labs Lab 09/28/13 0505 09/29/13 0300  NA 139 139  K 4.3 4.0  CL 110 108  CO2 14* 15*  GLUCOSE 142* 151*  BUN 15 11  CREATININE 1.01 0.92  CALCIUM 8.6 8.0*   Liver Function Tests:  Recent Labs Lab 09/28/13 0505  AST 57*  ALT 64*  ALKPHOS 106  BILITOT 0.6  PROT 6.8  ALBUMIN 2.6*   CBC:  Recent Labs Lab 09/28/13 0505  WBC 11.4*  NEUTROABS 9.0*  HGB 13.9  HCT 40.9  MCV 91.5  PLT 123*   Cardiac Enzymes:  Recent Labs Lab 09/28/13 0906  TROPONINI <0.30   Thyroid Function Tests:  Recent Labs Lab 09/28/13 0906  TSH 0.998   Coagulation:  Recent Labs Lab 09/26/13 1647 09/28/13 0505  09/29/13 0300  LABPROT  --  29.1* 27.7*  INR 3.40 2.87* 2.69*   Urine Drug Screen: Drugs of Abuse     Component Value Date/Time   LABOPIA NONE DETECTED 09/28/2013 0850   LABOPIA NEG 05/31/2013 1625   COCAINSCRNUR NONE DETECTED 09/28/2013 0850   COCAINSCRNUR NEG 05/31/2013 1625   LABBENZ NONE DETECTED 09/28/2013 0850   LABBENZ NEG 05/31/2013 1625   LABBENZ NEGATIVE 03/09/2012 0741   AMPHETMU NONE DETECTED 09/28/2013 0850   AMPHETMU NEGATIVE 03/09/2012 0741   THCU NONE DETECTED 09/28/2013 0850   LABBARB NONE DETECTED 09/28/2013 0850   LABBARB NEG 05/31/2013 1625    Urinalysis:  Recent Labs Lab 09/28/13 0539  COLORURINE AMBER*  LABSPEC 1.014  PHURINE 6.0  GLUCOSEU NEGATIVE  HGBUR NEGATIVE  BILIRUBINUR NEGATIVE  KETONESUR NEGATIVE  PROTEINUR 30*  UROBILINOGEN 1.0  NITRITE NEGATIVE  LEUKOCYTESUR NEGATIVE    Micro Results: Recent Results (from the past 240 hour(s))  CULTURE, BLOOD (ROUTINE X 2)     Status: None   Collection Time    09/28/13  4:48 AM      Result Value Ref Range Status   Specimen Description BLOOD RIGHT ANTECUBITAL   Final   Special Requests BOTTLES DRAWN AEROBIC ONLY 2CC   Final   Culture  Setup Time  Final   Value: 09/28/2013 08:10     Performed at [facility]   Culture     Final   Value:        BLOOD CULTURE RECEIVED NO GROWTH TO DATE CULTURE WILL BE HELD FOR 5 DAYS BEFORE ISSUING A FINAL NEGATIVE REPORT     Performed at [facility]   Report Status PENDING   Incomplete  CULTURE, BLOOD (ROUTINE X 2)     Status: None   Collection Time    09/28/13  5:05 AM      Result Value Ref Range Status   Specimen Description BLOOD FOREARM RIGHT   Final   Special Requests BOTTLES DRAWN AEROBIC ONLY 5CC   Final   Culture  Setup Time     Final   Value: 09/28/2013 08:10     Performed at [facility]   Culture     Final   Value:        BLOOD CULTURE RECEIVED NO GROWTH TO DATE CULTURE WILL BE HELD FOR 5 DAYS BEFORE ISSUING A FINAL NEGATIVE  REPORT     Performed at [facility]   Report Status PENDING   Incomplete  URINE CULTURE     Status: None   Collection Time    09/28/13  5:40 AM      Result Value Ref Range Status   Specimen Description URINE, CLEAN CATCH   Final   Special Requests NONE   Final   Culture  Setup Time     Final   Value: 09/28/2013 08:53     Performed at [facility]     Final   Value: NO GROWTH     Performed at [facility]   Culture     Final   Value: NO GROWTH     Performed at [facility]   Report Status 09/29/2013 FINAL   Final  MRSA PCR SCREENING     Status: None   Collection Time    09/28/13  1:39 PM      Result Value Ref Range Status   MRSA by PCR NEGATIVE  NEGATIVE Final   Comment:            The GeneXpert MRSA Assay (FDA     approved for NASAL specimens     only), is one component of a     comprehensive MRSA colonization     surveillance program. It is not     intended to diagnose MRSA     infection nor to guide or     monitor treatment for     MRSA infections.   Studies/Results: Dg Chest Port 1 View  09/28/2013   CLINICAL DATA Chest pain, shortness of breath  EXAM PORTABLE CHEST - 1 VIEW  COMPARISON 04/14/2013  FINDINGS Consolidation within the lingula and to a lesser extent left lower lobe. Mild right infrahilar opacity as well. Heart size upper normal. Mediastinal contours within normal range. No pleural effusion or pneumothorax. No acute osseous finding.  IMPRESSION Lingular consolidation may reflect pneumonia.  Mild lung base opacities; atelectasis versus infiltrate.  SIGNATURE  Electronically Signed   By: Carlos Levering M.D.   On: 09/28/2013 05:21   Medications: I have reviewed the patient's current medications. Scheduled Meds: . azithromycin  500 mg Intravenous Q24H  . cefTRIAXone (ROCEPHIN)  IV  1 g Intravenous Q24H  . Darunavir Ethanolate  800 mg Oral Q breakfast  . elvitegravir-cobicistat-emtricitabine-tenofovir  1  tablet  Oral Q breakfast  . ipratropium-albuterol  3 mL Nebulization Q4H  . mometasone-formoterol  2 puff Inhalation BID  . QUEtiapine  150 mg Oral QHS  . vancomycin  1,000 mg Intravenous Q8H  . warfarin  2.5 mg Oral q1800  . Warfarin - Pharmacist Dosing Inpatient   Does not apply q1800   Continuous Infusions: . sodium chloride     PRN Meds:.acetaminophen, acetaminophen, ondansetron (ZOFRAN) IV, ondansetron Assessment/Plan: #Sepsis 2/2 CAP- Patient presented with fever (102.2), tachycardia, and tachypnea (3/4 SIRS criteria), with lingula/LLL pneumonia seen on CXR, consistent with CAP.  Lactic acid elevated to 3.0 --> 1.8.  Patient maintaining O2 sats on [place], and mental status is intact.  COPD exacerbation is less likely; although the patient does meet the cardinal criteria, these can also be explained by CAP, and no wheezing on exam with pCO2 at 20.9 thus no indication for steroids at this time (and may cause harm given infection).  Unlikely flu, given lack of myalgias, and evidence of infection on CXR.  Unlikely PE given that patient is therapeutic on warfarin.  Patient has now been afebrile since 4am on 4/12.  Urine strep +.  Blood cultures NGTD.   -continue vancomycin, ceftriaxone, azithromycin  -sputum Gram stain and culture -NS at 75 cc/hr -duonebs q4h   #AG acidosis with respiratory alkalosis- Patient presented with AG = 15 and bicarb = 14, consistent with an elevated anion gap acidosis likely due to lactic acidosis (lactic acid = 3).  Additionally, patient's ABG showed pH 7.34, and pCO2 = 20.9.  Per Winter's formula, the expected pCO2 should be 27-31, indicating a co-existing respiratory alkalosis likely due to sepsis.  Salicylate negative.  AG 16 today.   -BMP in AM  -continue O2 to keep sats > 92%   #Sinus Tachycardia - Patient presented with HR as high as 160 reported by EMS, now down to 100s.  During his last 4 office visits, HR has been in the 90-100s so may have baseline tachycardia.  Unlikely ACS causing new-onset tachyarrythmia, given lack of chest pain and labs.  Troponin x 1 negative.  UDS negative.  TSH within normal limits.  -treat sepsis per above  -IVF per above   #HTN- history of HTN, on metoprolol and lisinopril at home -continue holding home meds for now in the setting of sepsis   #HIV- last CD4 = 630 (04/04/13). Given such a high CD4, opportunistic infections as a cause of his pneumonia are unlikely.  -continue prezista, stribild   #COPD- chronic, see above -continue dulera, nebs   #History of DVT- unprovoked DVT 04/14/13, scheduled to stop warfarin 10/12/13. INR = 2.8 --> 2.69 thus therapeutic.  -continue warfarin per pharmacy   Dispo: Disposition is deferred at this time, awaiting improvement of current medical problems.  Anticipated discharge in approximately 1-2 day(s).   The patient does have a current PCP Jessee Avers, MD) and does need an [facility] hospital follow-up appointment after discharge.   .Services Needed at time of discharge: Y = Yes, Blank = No PT:   OT:   RN:   Equipment:   Other:     LOS: 1 day   Ivin Poot, MD 09/29/2013, 3:24 PM

## 2013-09-29 NOTE — Progress Notes (Signed)
Internal Medicine Attending  Date: 09/29/2013  Patient name: Jacob Joseph Medical record number: [id number] Date of birth: Sep 13, 1955 Age: 58 y.o. Gender: male  I saw and evaluated the patient. I reviewed the resident's note by Dr. Stann Mainland and I agree with the resident's findings and plans as documented in her note.

## 2013-09-29 NOTE — Progress Notes (Signed)
ANTICOAGULATION CONSULT NOTE - Follow-up  Pharmacy Consult for Coumadin Indication: h/o VTE  Allergies  Allergen Reactions  . Statins Other (See Comments)    Elevated liver enzymes.    Patient Measurements: Height: 6' (182.9 cm) Weight: 218 lb 7.6 oz (99.1 kg) IBW/kg (Calculated) : 77.6    Vital Signs: Temp: 98.4 F (36.9 C) (03/13 0740) Temp src: Oral (03/13 0740) BP: 111/68 mmHg (03/13 0740) Pulse Rate: 98 (03/13 0740)  Labs:  Recent Labs  09/26/13 1647 09/28/13 0505 09/28/13 0906 09/29/13 0300  HGB  --  13.9  --   --   HCT  --  40.9  --   --   PLT  --  123*  --   --   LABPROT  --  29.1*  --  27.7*  INR 3.40 2.87*  --  2.69*  CREATININE  --  1.01  --  0.92  TROPONINI  --   --  <0.30  --     Estimated Creatinine Clearance: 109.3 ml/min (by C-G formula based on Cr of 0.92).  Medications:  Coumadin 2.5mg  daily  Assessment: dyspnea 3 yom presented to the ED with SOB. He is on chronic coumadin for hx DVT with initial plan to stop coumadin on 10/12/13. INR today remains therapeutic at 2.69 on the patients home dose of 2.5mg  daily. No new CBC today, no bleeding noted.   Goal of Therapy:  INR 2-3   Plan:  1. Continue coumadin 2.5mg  PO daily 2. F/u AM INR  Salome Arnt, PharmD, BCPS Pager # [phone] 09/29/2013 8:19 AM

## 2013-09-30 LAB — CBC
HEMATOCRIT: 33.2 % — AB (ref 39.0–52.0)
Hemoglobin: 11.2 g/dL — ABNORMAL LOW (ref 13.0–17.0)
MCH: 30.5 pg (ref 26.0–34.0)
MCHC: 33.7 g/dL (ref 30.0–36.0)
MCV: 90.5 fL (ref 78.0–100.0)
Platelets: 121 10*3/uL — ABNORMAL LOW (ref 150–400)
RBC: 3.67 MIL/uL — ABNORMAL LOW (ref 4.22–5.81)
RDW: 14.8 % (ref 11.5–15.5)
WBC: 17.2 10*3/uL — ABNORMAL HIGH (ref 4.0–10.5)

## 2013-09-30 LAB — BASIC METABOLIC PANEL
BUN: 8 mg/dL (ref 6–23)
CO2: 15 mEq/L — ABNORMAL LOW (ref 19–32)
CREATININE: 0.77 mg/dL (ref 0.50–1.35)
Calcium: 8.2 mg/dL — ABNORMAL LOW (ref 8.4–10.5)
Chloride: 108 mEq/L (ref 96–112)
GFR calc non Af Amer: >90 mL/min (ref >90)
Glucose, Bld: 208 mg/dL — ABNORMAL HIGH (ref 70–99)
POTASSIUM: 3.8 meq/L (ref 3.7–5.3)
Sodium: 137 mEq/L (ref 137–147)

## 2013-09-30 LAB — VANCOMYCIN, TROUGH: VANCOMYCIN TR: 14.5 ug/mL (ref 10.0–20.0)

## 2013-09-30 LAB — PROTIME-INR
INR: 2.8 — AB (ref 0.00–1.49)
Prothrombin Time: 28.5 seconds — ABNORMAL HIGH (ref 11.6–15.2)

## 2013-09-30 MED ORDER — VANCOMYCIN HCL 10 G IV SOLR
1250.0000 mg | Freq: Three times a day (TID) | INTRAVENOUS | Status: DC
  Administered 2013-09-30 – 2013-10-02 (×5): 1250 mg via INTRAVENOUS
  Filled 2013-09-30 (×6): qty 1250

## 2013-09-30 NOTE — Progress Notes (Signed)
Subjective: Pt had no acute events overnight. Pt feels his breathing has improved but is having some trouble urinating this AM. He states that the condom catheter kept falling off overnight and then he was attempting the urinal with little success. Otherwise he has no dysuria, pyuria, urgency, or pain. He was able to tolerate a full meal.   Objective: Vital signs in last 24 hours: Filed Vitals:   09/29/13 1952 09/29/13 2021 09/30/13 0025 09/30/13 0400  BP: 118/78 122/87 116/92 127/73  Pulse: 104 100 107 97  Temp: 98.5 F (36.9 C)  98 F (36.7 C) 98.1 F (36.7 C)  TempSrc: Oral  Oral Oral  Resp: 16 20 21 18   Height:      Weight:      SpO2: 96% 99% 97% 93%   Weight change:   Intake/Output Summary (Last 24 hours) at 09/30/13 0720 Last data filed at 09/30/13 0600  Gross per 24 hour  Intake   3230 ml  Output   1900 ml  Net   1330 ml    General: alert, cooperative, and in no apparent distress HEENT: NCAT, vision grossly intact, oropharynx clear and non-erythematous  Neck: supple, no lymphadenopathy Lungs: diffuse rhonchi L>R, normal work of respiration, no wheezes, rales Heart: regular rate and rhythm, no murmurs, gallops, or rubs Abdomen: soft, non-tender, non-distended, normal bowel sounds Extremities: 2+ DP/PT pulses bilaterally, no cyanosis, clubbing, or edema Neurologic: alert & oriented X3, cranial nerves II-XII intact, strength grossly intact, sensation intact to light touch  Lab Results: Basic Metabolic Panel:  Recent Labs Lab 09/29/13 0300 09/30/13 0330  NA 139 137  K 4.0 3.8  CL 108 108  CO2 15* 15*  GLUCOSE 151* 208*  BUN 11 8  CREATININE 0.92 0.77  CALCIUM 8.0* 8.2*   CBC:  Recent Labs Lab 09/28/13 0505 09/30/13 0330  WBC 11.4* 17.2*  NEUTROABS 9.0*  --   HGB 13.9 11.2*  HCT 40.9 33.2*  MCV 91.5 90.5  PLT 123* 121*   Coagulation:  Recent Labs Lab 09/26/13 1647 09/28/13 0505 09/29/13 0300 09/30/13 0330  LABPROT  --  29.1* 27.7* 28.5*   INR 3.40 2.87* 2.69* 2.80*    Micro Results: Recent Results (from the past 240 hour(s))  CULTURE, BLOOD (ROUTINE X 2)     Status: None   Collection Time    09/28/13  4:48 AM      Result Value Ref Range Status   Specimen Description BLOOD RIGHT ANTECUBITAL   Final   Special Requests BOTTLES DRAWN AEROBIC ONLY 2CC   Final   Culture  Setup Time     Final   Value: 09/28/2013 08:10     Performed at [facility]   Culture     Final   Value:        BLOOD CULTURE RECEIVED NO GROWTH TO DATE CULTURE WILL BE HELD FOR 5 DAYS BEFORE ISSUING A FINAL NEGATIVE REPORT     Performed at [facility]   Report Status PENDING   Incomplete  CULTURE, BLOOD (ROUTINE X 2)     Status: None   Collection Time    09/28/13  5:05 AM      Result Value Ref Range Status   Specimen Description BLOOD FOREARM RIGHT   Final   Special Requests BOTTLES DRAWN AEROBIC ONLY 5CC   Final   Culture  Setup Time     Final   Value: 09/28/2013 08:10     Performed at [facility]  Culture     Final   Value:        BLOOD CULTURE RECEIVED NO GROWTH TO DATE CULTURE WILL BE HELD FOR 5 DAYS BEFORE ISSUING A FINAL NEGATIVE REPORT     Performed at [facility]   Report Status PENDING   Incomplete  URINE CULTURE     Status: None   Collection Time    09/28/13  5:40 AM      Result Value Ref Range Status   Specimen Description URINE, CLEAN CATCH   Final   Special Requests NONE   Final   Culture  Setup Time     Final   Value: 09/28/2013 08:53     Performed at [facility]     Final   Value: NO GROWTH     Performed at [facility]   Culture     Final   Value: NO GROWTH     Performed at [facility]   Report Status 09/29/2013 FINAL   Final  MRSA PCR SCREENING     Status: None   Collection Time    09/28/13  1:39 PM      Result Value Ref Range Status   MRSA by PCR NEGATIVE  NEGATIVE Final   Comment:            The GeneXpert MRSA Assay (FDA     approved for  NASAL specimens     only), is one component of a     comprehensive MRSA colonization     surveillance program. It is not     intended to diagnose MRSA     infection nor to guide or     monitor treatment for     MRSA infections.  CULTURE, EXPECTORATED SPUTUM-ASSESSMENT     Status: None   Collection Time    09/29/13  3:00 PM      Result Value Ref Range Status   Specimen Description SPUTUM   Final   Special Requests Immunocompromised   Final   Sputum evaluation     Final   Value: MICROSCOPIC FINDINGS SUGGEST THAT THIS SPECIMEN IS NOT REPRESENTATIVE OF LOWER RESPIRATORY SECRETIONS. PLEASE RECOLLECT.     CALLED TO M.HANCOCK,RN AT 2136 BY L.PITT 09/29/13   Report Status 09/29/2013 FINAL   Final    Medications: I have reviewed the patient's current medications. Scheduled Meds: . azithromycin  500 mg Intravenous Q24H  . cefTRIAXone (ROCEPHIN)  IV  1 g Intravenous Q24H  . Darunavir Ethanolate  800 mg Oral Q breakfast  . elvitegravir-cobicistat-emtricitabine-tenofovir  1 tablet Oral Q breakfast  . ipratropium-albuterol  3 mL Nebulization QID  . mometasone-formoterol  2 puff Inhalation BID  . QUEtiapine  150 mg Oral QHS  . vancomycin  1,000 mg Intravenous Q8H  . warfarin  2.5 mg Oral q1800  . Warfarin - Pharmacist Dosing Inpatient   Does not apply q1800   Continuous Infusions: . sodium chloride 1,000 mL (09/30/13 0407)   PRN Meds:.acetaminophen, acetaminophen, albuterol, ondansetron (ZOFRAN) IV, ondansetron Assessment/Plan: #Sepsis 2/2 CAP- Patient presented with fever (102.2), tachycardia, and tachypnea (3/4 SIRS criteria), with lingula/LLL pneumonia seen on CXR, consistent with CAP.  Lactic acid elevated to 3.0 --> 1.8.  Patient maintaining O2 sats on [place], and mental status is intact.  COPD exacerbation is less likely; although the patient does meet the cardinal criteria, these can also be explained by CAP, and no wheezing on exam with pCO2 at 20.9 thus no indication  for steroids at this  time (and may cause harm given infection).  Unlikely flu, given lack of myalgias, and evidence of infection on CXR.  Unlikely PE given that patient is therapeutic on warfarin.  Patient has now been afebrile since 4am on 4/12.  Urine strep pnuemo +.  Blood cultures NGTD.  Pt with some increased leukocytosis this AM.  -continue vancomycin, ceftriaxone, azithromycin  -sputum Gram stain and culture still pending  -NS at 75 cc/hr -duonebs q4h   #AG acidosis with respiratory alkalosis improving- Patient presented with AG = 15 and bicarb = 14, consistent with an elevated anion gap acidosis likely due to lactic acidosis (lactic acid = 3).  Additionally, patient's ABG showed pH 7.34, and pCO2 = 20.9.  Per Winter's formula, the expected pCO2 should be 27-31, indicating a co-existing respiratory alkalosis likely due to sepsis.  Salicylate negative.  AG 14 today.   -BMP in AM  -continue O2 to keep sats > 92%   #Sinus Tachycardia - Patient presented with HR as high as 160 reported by EMS, now down to 100s.  During his last 4 office visits, HR has been in the 90-100s so may have baseline tachycardia. Unlikely ACS causing new-onset tachyarrythmia, given lack of chest pain and labs.  Troponin x 1 negative.  UDS negative.  TSH within normal limits.  -treat sepsis per above  -IVF per above   #HTN- history of HTN, on metoprolol and lisinopril at home -continue holding home meds for now in the setting of sepsis   #HIV- last CD4 = 630 (04/04/13). Given such a high CD4, opportunistic infections as a cause of his pneumonia are unlikely.  -continue prezista, stribild   #COPD- chronic, see above -continue dulera, nebs   #History of DVT- unprovoked DVT 04/14/13, scheduled to stop warfarin 10/12/13. INR = 2.8 --> 2.69>>2.8 thus therapeutic.  -continue warfarin per pharmacy  Dispo: Disposition is deferred at this time, awaiting improvement of current medical problems.  Anticipated discharge in approximately 1-2 day(s).    The patient does have a current PCP Jessee Avers, MD) and does need an [facility] hospital follow-up appointment after discharge.   .Services Needed at time of discharge: Y = Yes, Blank = No PT:   OT:   RN:   Equipment:   Other:     LOS: 2 days   Clinton Gallant, MD 09/30/2013, 7:20 AM

## 2013-09-30 NOTE — Progress Notes (Signed)
ANTICOAGULATION CONSULT NOTE - Follow-up  Pharmacy Consult for Coumadin Indication: h/o VTE  Allergies  Allergen Reactions  . Statins Other (See Comments)    Elevated liver enzymes.    Patient Measurements: Height: 6' (182.9 cm) Weight: 218 lb 7.6 oz (99.1 kg) IBW/kg (Calculated) : 77.6    Vital Signs: Temp: 98.4 F (36.9 C) (03/14 1100) Temp src: Oral (03/14 1100) BP: 131/72 mmHg (03/14 1100) Pulse Rate: 101 (03/14 1100)  Labs:  Recent Labs  09/28/13 0505 09/28/13 0906 09/29/13 0300 09/30/13 0330  HGB 13.9  --   --  11.2*  HCT 40.9  --   --  33.2*  PLT 123*  --   --  121*  LABPROT 29.1*  --  27.7* 28.5*  INR 2.87*  --  2.69* 2.80*  CREATININE 1.01  --  0.92 0.77  TROPONINI  --  <0.30  --   --     Estimated Creatinine Clearance: 125.7 ml/min (by C-G formula based on Cr of 0.77).  Medications:  Coumadin 2.5mg  daily  Assessment: dyspnea 70 yom presented to the ED with SOB. He is on chronic coumadin for hx DVT with initial plan to stop coumadin on 10/12/13. INR today remains therapeutic at 2.80 on the patients home dose of 2.5mg  daily. Hg 11.2, platelets 121. No bleeding noted.  Goal of Therapy:  INR 2-3   Plan:  - Continue warfarin PO 2.5mg  daily - Monitor daily CBC, INR, and s/s bleeding  Roderic Palau A. Pincus Badder, PharmD Clinical Pharmacist - Resident Pager: [phone] Pharmacy: [phone] 09/30/2013 2:18 PM

## 2013-09-30 NOTE — Progress Notes (Signed)
ANTIBIOTIC CONSULT NOTE - FOLLOW UP  Pharmacy Consult for vancomycin Indication: pneumonia and sepsis  Labs:  Recent Labs  09/28/13 0505 09/29/13 0300 09/30/13 0330  WBC 11.4*  --  17.2*  HGB 13.9  --  11.2*  PLT 123*  --  121*  CREATININE 1.01 0.92 0.77   Estimated Creatinine Clearance: 125.7 ml/min (by C-G formula based on Cr of 0.77).  Recent Labs  09/30/13 2120  [place] 14.5     Microbiology: Recent Results (from the past 720 hour(s))  CULTURE, BLOOD (ROUTINE X 2)     Status: None   Collection Time    09/28/13  4:48 AM      Result Value Ref Range Status   Specimen Description BLOOD RIGHT ANTECUBITAL   Final   Special Requests BOTTLES DRAWN AEROBIC ONLY 2CC   Final   Culture  Setup Time     Final   Value: 09/28/2013 08:10     Performed at [facility]   Culture     Final   Value:        BLOOD CULTURE RECEIVED NO GROWTH TO DATE CULTURE WILL BE HELD FOR 5 DAYS BEFORE ISSUING A FINAL NEGATIVE REPORT     Performed at [facility]   Report Status PENDING   Incomplete  CULTURE, BLOOD (ROUTINE X 2)     Status: None   Collection Time    09/28/13  5:05 AM      Result Value Ref Range Status   Specimen Description BLOOD FOREARM RIGHT   Final   Special Requests BOTTLES DRAWN AEROBIC ONLY 5CC   Final   Culture  Setup Time     Final   Value: 09/28/2013 08:10     Performed at [facility]   Culture     Final   Value:        BLOOD CULTURE RECEIVED NO GROWTH TO DATE CULTURE WILL BE HELD FOR 5 DAYS BEFORE ISSUING A FINAL NEGATIVE REPORT     Performed at [facility]   Report Status PENDING   Incomplete  URINE CULTURE     Status: None   Collection Time    09/28/13  5:40 AM      Result Value Ref Range Status   Specimen Description URINE, CLEAN CATCH   Final   Special Requests NONE   Final   Culture  Setup Time     Final   Value: 09/28/2013 08:53     Performed at [facility]     Final   Value: NO GROWTH   Performed at [facility]   Culture     Final   Value: NO GROWTH     Performed at [facility]   Report Status 09/29/2013 FINAL   Final  MRSA PCR SCREENING     Status: None   Collection Time    09/28/13  1:39 PM      Result Value Ref Range Status   MRSA by PCR NEGATIVE  NEGATIVE Final   Comment:            The GeneXpert MRSA Assay (FDA     approved for NASAL specimens     only), is one component of a     comprehensive MRSA colonization     surveillance program. It is not     intended to diagnose MRSA     infection nor to guide or     monitor treatment  for     MRSA infections.  CULTURE, EXPECTORATED SPUTUM-ASSESSMENT     Status: None   Collection Time    09/29/13  3:00 PM      Result Value Ref Range Status   Specimen Description SPUTUM   Final   Special Requests Immunocompromised   Final   Sputum evaluation     Final   Value: MICROSCOPIC FINDINGS SUGGEST THAT THIS SPECIMEN IS NOT REPRESENTATIVE OF LOWER RESPIRATORY SECRETIONS. PLEASE RECOLLECT.     CALLED TO M.HANCOCK,RN AT 2136 BY L.PITT 09/29/13   Report Status 09/29/2013 FINAL   Final     Assessment: 58yo male slightly subtherapeutic on vancomycin with initial dosing for PNA and sepsis.  Goal of Therapy:  Vancomycin trough level 15-20 mcg/ml  Plan:  Will change vancomycin to 1250mg  IV Q8H and continue to monitor.  Wynona Neat, PharmD, BCPS  09/30/2013,11:05 PM

## 2013-10-01 LAB — CBC
HEMATOCRIT: 34.4 % — AB (ref 39.0–52.0)
Hemoglobin: 11.6 g/dL — ABNORMAL LOW (ref 13.0–17.0)
MCH: 30.5 pg (ref 26.0–34.0)
MCHC: 33.7 g/dL (ref 30.0–36.0)
MCV: 90.5 fL (ref 78.0–100.0)
Platelets: 133 10*3/uL — ABNORMAL LOW (ref 150–400)
RBC: 3.8 MIL/uL — ABNORMAL LOW (ref 4.22–5.81)
RDW: 15.1 % (ref 11.5–15.5)
WBC: 13.4 10*3/uL — ABNORMAL HIGH (ref 4.0–10.5)

## 2013-10-01 LAB — BASIC METABOLIC PANEL
BUN: 10 mg/dL (ref 6–23)
CO2: 15 mEq/L — ABNORMAL LOW (ref 19–32)
CREATININE: 0.71 mg/dL (ref 0.50–1.35)
Calcium: 8.7 mg/dL (ref 8.4–10.5)
Chloride: 108 mEq/L (ref 96–112)
GFR calc Af Amer: >90 mL/min (ref >90)
Glucose, Bld: 93 mg/dL (ref 70–99)
Potassium: 4.5 mEq/L (ref 3.7–5.3)
Sodium: 139 mEq/L (ref 137–147)

## 2013-10-01 LAB — PROTIME-INR
INR: 2.71 — ABNORMAL HIGH (ref 0.00–1.49)
Prothrombin Time: 27.8 seconds — ABNORMAL HIGH (ref 11.6–15.2)

## 2013-10-01 NOTE — Discharge Instructions (Addendum)
Please take your antibiotics as prescribed, we sent in a prescription for LEVOFLOXACIN to your pharmacy.   Do not take your blood pressure medicines until your follow-up appointment with Dr. Alice Rieger on Wednesday.   Keep taking your Coumadin for now at dose of 2.5 mg daily, see more information below.   Pneumonia, Adult Pneumonia is an infection of the lungs. It may be caused by a germ (virus or bacteria). Some types of pneumonia can spread easily from person to person. This can happen when you cough or sneeze. HOME CARE  Only take medicine as told by your doctor.  Take your medicine (antibiotics) as told. Finish it even if you start to feel better.  Do not smoke.  You may use a vaporizer or humidifier in your room. This can help loosen thick spit (mucus).  Sleep so you are almost sitting up (semi-upright). This helps reduce coughing.  Rest. A shot (vaccine) can help prevent pneumonia. Shots are often advised for:  People over 55 years old.  Patients on chemotherapy.  People with long-term (chronic) lung problems.  People with immune system problems. GET HELP RIGHT AWAY IF:   You are getting worse.  You cannot control your cough, and you are losing sleep.  You cough up blood.  Your pain gets worse, even with medicine.  You have a fever.  Any of your problems are getting worse, not better.  You have shortness of breath or chest pain. MAKE SURE YOU:   Understand these instructions.  Will watch your condition.  Will get help right away if you are not doing well or get worse. Document Released: 12/23/2007 Document Revised: 09/28/2011 Document Reviewed: 09/26/2010 [facility] Patient Information 2014 [place].   Information on my medicine - Coumadin   (Warfarin)  This medication education was reviewed with me or my healthcare representative as part of my discharge preparation.  The pharmacist that spoke with me during my hospital stay was:  Burna Cash  [facility]  Why was Coumadin prescribed for you? Coumadin was prescribed for you because you have a blood clot or a medical condition that can cause an increased risk of forming blood clots. Blood clots can cause serious health problems by blocking the flow of blood to the heart, lung, or brain. Coumadin can prevent harmful blood clots from forming. As a reminder your indication for Coumadin is:   Blood Clotting Disorder  What test will check on my response to Coumadin? While on Coumadin (warfarin) you will need to have an INR test regularly to ensure that your dose is keeping you in the desired range. The INR (international normalized ratio) number is calculated from the result of the laboratory test called prothrombin time (PT).  If an INR APPOINTMENT HAS NOT ALREADY BEEN MADE FOR YOU please schedule an appointment to have this lab work done by your health care provider within 7 days. Your INR goal is usually a number between:  2 to 3 or your provider may give you a more narrow range like 2-2.5.  Ask your health care provider during an office visit what your goal INR is.  What  do you need to  know  About  COUMADIN? Take Coumadin (warfarin) exactly as prescribed by your healthcare provider about the same time each day.  DO NOT stop taking without talking to the doctor who prescribed the medication.  Stopping without other blood clot prevention medication to take the place of Coumadin may increase your risk of developing a new  clot or stroke.  Get refills before you run out.  What do you do if you miss a dose? If you miss a dose, take it as soon as you remember on the same day then continue your regularly scheduled regimen the next day.  Do not take two doses of Coumadin at the same time.  Important Safety Information A possible side effect of Coumadin (Warfarin) is an increased risk of bleeding. You should call your healthcare provider right away if you experience any of the following:   Bleeding from  an injury or your nose that does not stop.   Unusual colored urine (red or dark brown) or unusual colored stools (red or black).   Unusual bruising for unknown reasons.   A serious fall or if you hit your head (even if there is no bleeding).  Some foods or medicines interact with Coumadin (warfarin) and might alter your response to warfarin. To help avoid this:   Eat a balanced diet, maintaining a consistent amount of Vitamin K.   Notify your provider about major diet changes you plan to make.   Avoid alcohol or limit your intake to 1 drink for women and 2 drinks for men per day. (1 drink is 5 oz. wine, 12 oz. beer, or 1.5 oz. liquor.)  Make sure that ANY health care provider who prescribes medication for you knows that you are taking Coumadin (warfarin).  Also make sure the healthcare provider who is monitoring your Coumadin knows when you have started a new medication including herbals and non-prescription products.  Coumadin (Warfarin)  Major Drug Interactions  Increased Warfarin Effect Decreased Warfarin Effect  Alcohol (large quantities) Antibiotics (esp. Septra/Bactrim, Flagyl, Cipro) Amiodarone (Cordarone) Aspirin (ASA) Cimetidine (Tagamet) Megestrol (Megace) NSAIDs (ibuprofen, naproxen, etc.) Piroxicam (Feldene) Propafenone (Rythmol SR) Propranolol (Inderal) Isoniazid (INH) Posaconazole (Noxafil) Barbiturates (Phenobarbital) Carbamazepine (Tegretol) Chlordiazepoxide (Librium) Cholestyramine (Questran) Griseofulvin Oral Contraceptives Rifampin Sucralfate (Carafate) Vitamin K   Coumadin (Warfarin) Major Herbal Interactions  Increased Warfarin Effect Decreased Warfarin Effect  Garlic Ginseng Ginkgo biloba Coenzyme Q10 Green tea St. Johns wort    Coumadin (Warfarin) FOOD Interactions  Eat a consistent number of servings per week of foods HIGH in Vitamin K (1 serving =  cup)  Collards (cooked, or boiled & drained) Kale (cooked, or boiled & drained) Mustard  greens (cooked, or boiled & drained) Parsley *serving size only =  cup Spinach (cooked, or boiled & drained) Swiss chard (cooked, or boiled & drained) Turnip greens (cooked, or boiled & drained)  Eat a consistent number of servings per week of foods MEDIUM-HIGH in Vitamin K (1 serving = 1 cup)  Asparagus (cooked, or boiled & drained) Broccoli (cooked, boiled & drained, or raw & chopped) Brussel sprouts (cooked, or boiled & drained) *serving size only =  cup Lettuce, raw (green leaf, endive, romaine) Spinach, raw Turnip greens, raw & chopped   These websites have more information on Coumadin (warfarin):  FailFactory.se; VeganReport.com.au;

## 2013-10-01 NOTE — Progress Notes (Signed)
ANTICOAGULATION CONSULT NOTE - Follow-up  Pharmacy Consult for Coumadin Indication: h/o VTE  Allergies  Allergen Reactions  . Statins Other (See Comments)    Elevated liver enzymes.    Patient Measurements: Height: 6' (182.9 cm) Weight: 218 lb 7.6 oz (99.1 kg) IBW/kg (Calculated) : 77.6    Vital Signs: Temp: 98 F (36.7 C) (03/15 0753) Temp src: Axillary (03/15 0753) BP: 146/92 mmHg (03/15 0753) Pulse Rate: 88 (03/15 0753)  Labs:  Recent Labs  09/28/13 0906 09/29/13 0300 09/30/13 0330 10/01/13 0325  HGB  --   --  11.2*  --   HCT  --   --  33.2*  --   PLT  --   --  121*  --   LABPROT  --  27.7* 28.5* 27.8*  INR  --  2.69* 2.80* 2.71*  CREATININE  --  0.92 0.77  --   TROPONINI <0.30  --   --   --     Estimated Creatinine Clearance: 125.7 ml/min (by C-G formula based on Cr of 0.77).  Medications:  Coumadin 2.5mg  daily  Assessment: dyspnea 69 yom presented to the ED with SOB. He is on chronic coumadin for hx DVT with initial plan to stop coumadin on 10/12/13. INR today remains therapeutic at 2.71 on the patients home dose of 2.5mg  daily. No new CBC today. No bleeding noted.  Goal of Therapy:  INR 2-3   Plan:  - Continue warfarin PO 2.5mg  daily - Monitor daily CBC, INR, and s/s bleeding  Roderic Palau A. Pincus Badder, PharmD Clinical Pharmacist - Resident Pager: [phone] Pharmacy: [phone] 10/01/2013 8:40 AM

## 2013-10-01 NOTE — Progress Notes (Signed)
Subjective: Pt had no acute events overnight. Pt up to chair this AM. Ate full breakfast. condom cath placed and providing pt comfort as he is getting dyspneic with activity and had trouble manipulating urinal correctly. Pt feels his breathing has gotten better.   Objective: Vital signs in last 24 hours: Filed Vitals:   10/01/13 0400 10/01/13 0458 10/01/13 0744 10/01/13 0753  BP: 129/83 129/85  146/92  Pulse: 92 88  88  Temp:  98 F (36.7 C)  98 F (36.7 C)  TempSrc:  Axillary  Axillary  Resp: 21 19  20   Height:      Weight:      SpO2: 98% 98% 95% 91%   Weight change:   Intake/Output Summary (Last 24 hours) at 10/01/13 0859 Last data filed at 10/01/13 0800  Gross per 24 hour  Intake   1345 ml  Output   4375 ml  Net  -3030 ml    General: alert, cooperative, and in no apparent distress HEENT: NCAT, vision grossly intact, oropharynx clear and non-erythematous  Neck: supple, no lymphadenopathy Lungs: diffuse rhonchi L>R, normal work of respiration, no wheezes, rales Heart: regular rate and rhythm, no murmurs, gallops, or rubs Abdomen: soft, non-tender, non-distended, normal bowel sounds Extremities: 2+ DP/PT pulses bilaterally, no cyanosis, clubbing, or edema Neurologic: alert & oriented X3, cranial nerves II-XII intact, strength grossly intact, sensation intact to light touch  Lab Results: Basic Metabolic Panel:  Recent Labs Lab 09/29/13 0300 09/30/13 0330  NA 139 137  K 4.0 3.8  CL 108 108  CO2 15* 15*  GLUCOSE 151* 208*  BUN 11 8  CREATININE 0.92 0.77  CALCIUM 8.0* 8.2*   CBC:  Recent Labs Lab 09/28/13 0505 09/30/13 0330  WBC 11.4* 17.2*  NEUTROABS 9.0*  --   HGB 13.9 11.2*  HCT 40.9 33.2*  MCV 91.5 90.5  PLT 123* 121*   Coagulation:  Recent Labs Lab 09/28/13 0505 09/29/13 0300 09/30/13 0330 10/01/13 0325  LABPROT 29.1* 27.7* 28.5* 27.8*  INR 2.87* 2.69* 2.80* 2.71*    Micro Results: Recent Results (from the past 240 hour(s))  CULTURE,  BLOOD (ROUTINE X 2)     Status: None   Collection Time    09/28/13  4:48 AM      Result Value Ref Range Status   Specimen Description BLOOD RIGHT ANTECUBITAL   Final   Special Requests BOTTLES DRAWN AEROBIC ONLY 2CC   Final   Culture  Setup Time     Final   Value: 09/28/2013 08:10     Performed at [facility]   Culture     Final   Value:        BLOOD CULTURE RECEIVED NO GROWTH TO DATE CULTURE WILL BE HELD FOR 5 DAYS BEFORE ISSUING A FINAL NEGATIVE REPORT     Performed at [facility]   Report Status PENDING   Incomplete  CULTURE, BLOOD (ROUTINE X 2)     Status: None   Collection Time    09/28/13  5:05 AM      Result Value Ref Range Status   Specimen Description BLOOD FOREARM RIGHT   Final   Special Requests BOTTLES DRAWN AEROBIC ONLY 5CC   Final   Culture  Setup Time     Final   Value: 09/28/2013 08:10     Performed at [facility]   Culture     Final   Value:        BLOOD CULTURE RECEIVED NO  GROWTH TO DATE CULTURE WILL BE HELD FOR 5 DAYS BEFORE ISSUING A FINAL NEGATIVE REPORT     Performed at [facility]   Report Status PENDING   Incomplete  URINE CULTURE     Status: None   Collection Time    09/28/13  5:40 AM      Result Value Ref Range Status   Specimen Description URINE, CLEAN CATCH   Final   Special Requests NONE   Final   Culture  Setup Time     Final   Value: 09/28/2013 08:53     Performed at [place]     Final   Value: NO GROWTH     Performed at [facility]   Culture     Final   Value: NO GROWTH     Performed at [facility]   Report Status 09/29/2013 FINAL   Final  MRSA PCR SCREENING     Status: None   Collection Time    09/28/13  1:39 PM      Result Value Ref Range Status   MRSA by PCR NEGATIVE  NEGATIVE Final   Comment:            The GeneXpert MRSA Assay (FDA     approved for NASAL specimens     only), is one component of a     comprehensive MRSA colonization     surveillance  program. It is not     intended to diagnose MRSA     infection nor to guide or     monitor treatment for     MRSA infections.  CULTURE, EXPECTORATED SPUTUM-ASSESSMENT     Status: None   Collection Time    09/29/13  3:00 PM      Result Value Ref Range Status   Specimen Description SPUTUM   Final   Special Requests Immunocompromised   Final   Sputum evaluation     Final   Value: MICROSCOPIC FINDINGS SUGGEST THAT THIS SPECIMEN IS NOT REPRESENTATIVE OF LOWER RESPIRATORY SECRETIONS. PLEASE RECOLLECT.     CALLED TO M.HANCOCK,RN AT 2136 BY L.PITT 09/29/13   Report Status 09/29/2013 FINAL   Final    Medications: I have reviewed the patient's current medications. Scheduled Meds: . azithromycin  500 mg Intravenous Q24H  . cefTRIAXone (ROCEPHIN)  IV  1 g Intravenous Q24H  . Darunavir Ethanolate  800 mg Oral Q breakfast  . elvitegravir-cobicistat-emtricitabine-tenofovir  1 tablet Oral Q breakfast  . ipratropium-albuterol  3 mL Nebulization QID  . mometasone-formoterol  2 puff Inhalation BID  . QUEtiapine  150 mg Oral QHS  . vancomycin  1,250 mg Intravenous 3 times per day  . warfarin  2.5 mg Oral q1800  . Warfarin - Pharmacist Dosing Inpatient   Does not apply q1800   Continuous Infusions: . sodium chloride 1,000 mL (09/30/13 2214)   PRN Meds:.acetaminophen, acetaminophen, albuterol, ondansetron (ZOFRAN) IV, ondansetron Assessment/Plan: #Sepsis 2/2 CAP- Patient presented with fever (102.2), tachycardia, and tachypnea (3/4 SIRS criteria), with lingula/LLL pneumonia seen on CXR, consistent with CAP.  Lactic acid elevated to 3.0 --> 1.8.  Patient maintaining O2 sats on [place], and mental status is intact.  COPD exacerbation is less likely; although the patient does meet the cardinal criteria, these can also be explained by CAP, and no wheezing on exam with pCO2 at 20.9 thus no indication for steroids at this time (and may cause harm given infection).  Unlikely flu, given lack of  myalgias, and  evidence of infection on CXR.  Unlikely PE given that patient is therapeutic on warfarin.  Patient has now been afebrile since 4am on 4/12.  Urine strep pnuemo +.  Blood cultures NGTD.  Pt with improving leukocytosis 17>>13 this AM.  -continue vancomycin, ceftriaxone, azithromycin  -sputum Gram stain and culture still pending  -NS at 75 cc/hr -duonebs q4h  -move to tele -cont to encourage ambulation  #AG acidosis with respiratory alkalosis improving- Patient presented with AG = 15 and bicarb = 14, consistent with an elevated anion gap acidosis likely due to lactic acidosis (lactic acid = 3).  Additionally, patient's ABG showed pH 7.34, and pCO2 = 20.9.  Per Winter's formula, the expected pCO2 should be 27-31, indicating a co-existing respiratory alkalosis likely due to sepsis.  Salicylate negative.  AG 16 today with HCO3 15.   -BMP in AM  -continue O2 to keep sats > 92%   #Sinus Tachycardia-resolved - Patient presented with HR as high as 160 reported by EMS, now down to 100s.  During his last 4 office visits, HR has been in the 90-100s so may have baseline tachycardia. Unlikely ACS causing new-onset tachyarrythmia, given lack of chest pain and labs.  Troponin x 1 negative.  UDS negative.  TSH within normal limits.   #HTN- history of HTN, on metoprolol and lisinopril at home. Pt is improving clinically and maybe able to add back home meds if continues with some HTN.  -continue holding home meds for now in the setting of sepsis   #HIV- last CD4 = 630 (04/04/13). Given such a high CD4, opportunistic infections as a cause of his pneumonia are unlikely.  -continue prezista, stribild   #COPD- chronic, see above -continue dulera, nebs   #History of DVT- unprovoked DVT 04/14/13, scheduled to stop warfarin 10/12/13. INR = 2.8 --> 2.69>>2.8 thus therapeutic.  -continue warfarin per pharmacy  Dispo: Disposition is deferred at this time, awaiting improvement of current medical problems.  Anticipated  discharge in approximately 1-2 day(s).   The patient does have a current PCP Jessee Avers, MD) and does need an [facility] hospital follow-up appointment after discharge.   .Services Needed at time of discharge: Y = Yes, Blank = No PT:   OT:   RN:   Equipment:   Other:     LOS: 3 days   Clinton Gallant, MD 10/01/2013, 8:59 AM

## 2013-10-02 ENCOUNTER — Ambulatory Visit: Payer: Medicaid Other

## 2013-10-02 ENCOUNTER — Other Ambulatory Visit: Payer: Medicaid Other

## 2013-10-02 LAB — CBC
HCT: 33.7 % — ABNORMAL LOW (ref 39.0–52.0)
Hemoglobin: 11.5 g/dL — ABNORMAL LOW (ref 13.0–17.0)
MCH: 30.4 pg (ref 26.0–34.0)
MCHC: 34.1 g/dL (ref 30.0–36.0)
MCV: 89.2 fL (ref 78.0–100.0)
PLATELETS: 152 10*3/uL (ref 150–400)
RBC: 3.78 MIL/uL — ABNORMAL LOW (ref 4.22–5.81)
RDW: 14.7 % (ref 11.5–15.5)
WBC: 10.6 10*3/uL — ABNORMAL HIGH (ref 4.0–10.5)

## 2013-10-02 LAB — PROTIME-INR
INR: 2.45 — ABNORMAL HIGH (ref 0.00–1.49)
Prothrombin Time: 25.8 seconds — ABNORMAL HIGH (ref 11.6–15.2)

## 2013-10-02 LAB — BASIC METABOLIC PANEL
BUN: 12 mg/dL (ref 6–23)
CALCIUM: 8.8 mg/dL (ref 8.4–10.5)
CO2: 14 mEq/L — ABNORMAL LOW (ref 19–32)
Chloride: 108 mEq/L (ref 96–112)
Creatinine, Ser: 0.73 mg/dL (ref 0.50–1.35)
GFR calc Af Amer: >90 mL/min (ref >90)
GLUCOSE: 204 mg/dL — AB (ref 70–99)
Potassium: 4.6 mEq/L (ref 3.7–5.3)
Sodium: 138 mEq/L (ref 137–147)

## 2013-10-02 MED ORDER — METOPROLOL TARTRATE 25 MG PO TABS
25.0000 mg | ORAL_TABLET | Freq: Two times a day (BID) | ORAL | Status: DC
  Administered 2013-10-02: 25 mg via ORAL
  Filled 2013-10-02 (×2): qty 1

## 2013-10-02 MED ORDER — LEVOFLOXACIN 750 MG PO TABS
750.0000 mg | ORAL_TABLET | Freq: Every day | ORAL | Status: DC
  Administered 2013-10-02: 750 mg via ORAL
  Filled 2013-10-02: qty 1

## 2013-10-02 MED ORDER — LEVOFLOXACIN 750 MG PO TABS: 750.0000 mg | ORAL_TABLET | Freq: Every day | ORAL | Status: DC

## 2013-10-02 MED ORDER — LISINOPRIL 10 MG PO TABS
10.0000 mg | ORAL_TABLET | Freq: Every day | ORAL | Status: DC
  Administered 2013-10-02: 10 mg via ORAL
  Filled 2013-10-02: qty 1

## 2013-10-02 MED ORDER — IPRATROPIUM-ALBUTEROL 0.5-2.5 (3) MG/3ML IN SOLN: 3.0000 mL | Freq: Four times a day (QID) | RESPIRATORY_TRACT | Status: DC | PRN

## 2013-10-02 NOTE — Discharge Summary (Signed)
Name: Jacob Joseph MRN: [id number] DOB: June 05, 1956 58 y.o. PCP: Jessee Avers, MD  Date of Admission: 09/28/2013  4:07 AM Date of Discharge: 10/02/2013 Attending Physician: Dr. Marinda Elk  Discharge Diagnosis: Principal Problem:   Sepsis Active Problems:   HIV INFECTION   HYPERTENSION   CKD Stage 2   Warfarin anticoagulation   COPD, moderate   CAP (community acquired pneumonia)  Discharge Medications:   Medication List    STOP taking these medications       lisinopril 10 MG tablet  Commonly known as:  PRINIVIL     metoprolol tartrate 25 MG tablet  Commonly known as:  LOPRESSOR      TAKE these medications       albuterol 108 (90 BASE) MCG/ACT inhaler  Commonly known as:  PROVENTIL HFA;VENTOLIN HFA  Inhale 2 puffs into the lungs every 6 (six) hours as needed for wheezing or shortness of breath.     Darunavir Ethanolate 800 MG tablet  Commonly known as:  PREZISTA  Take 1 tablet (800 mg total) by mouth daily with breakfast.     elvitegravir-cobicistat-emtricitabine-tenofovir 150-150-200-300 MG Tabs tablet  Commonly known as:  STRIBILD  Take 1 tablet by mouth daily with breakfast.     ENSURE HIGH PROTEIN Liqd  Take 1 Can by mouth 2 (two) times daily.     Fluticasone-Salmeterol 100-50 MCG/DOSE Aepb  Commonly known as:  ADVAIR DISKUS  Inhale 1 puff into the lungs 2 (two) times daily.     levofloxacin 750 MG tablet  Commonly known as:  LEVAQUIN  Take 1 tablet (750 mg total) by mouth daily.     megestrol 40 MG/ML suspension  Commonly known as:  MEGACE ORAL  Take 20 mLs (800 mg total) by mouth daily.     mirtazapine 15 MG tablet  Commonly known as:  REMERON  Take 15 mg by mouth at bedtime.     QUEtiapine 100 MG tablet  Commonly known as:  SEROQUEL  Take 150 mg by mouth at bedtime.     tiotropium 18 MCG inhalation capsule  Commonly known as:  SPIRIVA HANDIHALER  Place 1 capsule (18 mcg total) into inhaler and inhale daily.     warfarin 5 MG tablet    Commonly known as:  COUMADIN  Take 2.5 mg by mouth daily.     zolpidem 10 MG tablet  Commonly known as:  AMBIEN  Take 10 mg by mouth at bedtime as needed. For sleep        Disposition and follow-up:   Jacob Joseph was discharged from [facility] in Stable condition.  At the hospital follow up visit please address:  1.  Compliance with antibiotics, continued resolution of shortness of breath, cough?  2.  Re-evaluate BP and determine if patient should restart one or both of home anti-hypertensives (metoprolol, lisinopril)  3.  Instructions on discontinuing Coumadin 10/12/13  4.  Labs / imaging needed at time of follow-up: BMP, INR  5.  Pending labs/ test needing follow-up: none  Follow-up Appointments: Follow-up Information   Follow up with Jessee Avers, MD On 10/04/2013. (1:15pm)    Specialty:  Internal Medicine   Contact information:   [place] [address] [phone]       Discharge Instructions:  Future Appointments Provider Department Dept Phone   10/04/2013 1:15 PM Jessee Avers, MD [place] [phone]   10/09/2013 2:30 PM [facility] [place] [phone]  10/16/2013 2:45 PM Michel Bickers, MD [facility] [phone]      Consultations:  none  Procedures Performed:  Dg Chest Port 1 View  09/28/2013   CLINICAL DATA Chest pain, shortness of breath  EXAM PORTABLE CHEST - 1 VIEW  COMPARISON 04/14/2013  FINDINGS Consolidation within the lingula and to a lesser extent left lower lobe. Mild right infrahilar opacity as well. Heart size upper normal. Mediastinal contours within normal range. No pleural effusion or pneumothorax. No acute osseous finding.  IMPRESSION Lingular consolidation may reflect pneumonia.  Mild lung base opacities; atelectasis versus infiltrate.  SIGNATURE  Electronically Signed   By: Carlos Levering  M.D.   On: 09/28/2013 05:21    Admission HPI:  The patient is a 58 yo man, history of HIV (CD4 = 630), COPD, CKD, presenting with dypsnea. The patient notes a 3-4 day history of dyspnea, worse with exertion though present at rest, associated with cough productive of yellow sputum, and chills. He has been using his albuterol inhaler more frequently, though with no relief of symptoms. He notes that his wife had a "cold" about 1 week ago, which has since resolved. He notes no myalgias, rhinorrhea, sore throat, or chest pain. On EMS arrival, his HR was found to be in the 160's, and he was given adenosine x2 (6mg , 12mg ), without much improvement. In the ED, he was given 3 L NS boluses, and started on vanc/cefepime for a likely pneumonia seen on CXR.   Hospital Course by problem list: 1. Sepsis 2/2 CAP, resolved- Patient presented with fever (102.2), tachycardia, and tachypnea (3/4 SIRS criteria), with lingula/LLL pneumonia seen on CXR, consistent with CAP.  Urine strep pnuemo positive.  Lactic acid on admission elevated to 3.0 but trended down to 1.8.  Antibiotic therapy with vancomycin, ceftriaxone, and azithromycin were initiated on 3/12, continued through morning of discharge when patient was converted to levofloxacin PO 750 mg x 5 additional days (10 day total antibiotic course); patient was also treated with scheduled duonebs through day of discharge.  COPD exacerbation was also considered but less likely; although patient did meet the cardinal criteria, these could also be explained by CAP, and no wheezing on exam with pCO2 at 20.9 thus no indication for steroids.  Patient maintained O2 sats on [place] initially, on RA day of discharge.  Patient afebrile since 4am on 4/12.  Blood cultures NGTD.  Sputum culture inadequate.  Patient will need follow-up CXR in 6 weeks.   2. AG acidosis with respiratory alkalosis- Patient presented with elevated AG of 15 and bicarb of 14, consistent with an elevated anion gap  acidosis likely due to lactic acidosis (lactic acid = 3).  Additionally, patient's ABG showed pH 7.34, and pCO2 = 20.9.  Per Winter's formula, the expected pCO2 should be 27-31, indicating a co-existing respiratory alkalosis likely due to sepsis.  Salicylate level negative.  AG 16 on day of discharge with HCO3 14, will likely resolve with completion of pneumonia treatment per above.  Repeat BMP at follow-up appointment on 3/18.   3. Sinus Tachycardia, improved- Patient presented with HR as high as 160 reported by EMS, now down to 90s.  During his last several office visits, HR has been in the 90-100s so may have baseline tachycardia.  Unlikely ACS causing new-onset tachyarrythmia, given lack of chest pain and labs. Troponin x 1 negative. UDS negative. TSH within normal limits.   4. HTN- Patient has history of HTN, on metoprolol and lisinopril  at home.  Held antihypertensives in setting of sepsis while inpatient and at discharge.  He received one dose of each on morning of discharge but given low-normotensive BPs after, continued holding at discharge.  PCP to re-evaluate BP at follow-up appointment on 3/18 and restart home meds if necessary.   5. HIV, controlled- Last CD4 = 630 (04/04/13), follows with Dr. Megan Salon. Given high CD4, opportunistic infections as cause of his pneumonia unlikely.  Continued Prezista, Stribild while inpatient and at discharge.    6. COPD- stable, no acute exacerbation at this time per above.  Provided Dulera, duonebs while inpatient, restarted Advair, Spiriva at discharge.   7. History of DVT- unprovoked DVT on 04/14/13, scheduled to stop warfarin on 10/12/13. INR = 2.8 --> 2.69 --> 2.8 --> 2.7 --> 2.45 this admission thus therapeutic.  Per pharmacy, patient should continue warfarin 2.5 mg daily through PCP follow-up.  PCP to address discontinuation of Coumadin in <2 weeks.   Discharge Vitals:   BP 99/64  Pulse 98  Temp(Src) 98.3 F (36.8 C) (Oral)  Resp 19  Ht 6' (1.829 m)   Wt 214 lb 1.1 oz (97.1 kg)  BMI 29.03 kg/m2  SpO2 98%  Discharge Labs:  Results for orders placed during the hospital encounter of 09/28/13 (from the past 24 hour(s))  PROTIME-INR     Status: Abnormal   Collection Time    10/02/13  5:51 AM      Result Value Ref Range   Prothrombin Time 25.8 (*) 11.6 - 15.2 seconds   INR 2.45 (*) 0.00 - 0.97  BASIC METABOLIC PANEL     Status: Abnormal   Collection Time    10/02/13  5:51 AM      Result Value Ref Range   Sodium 138  137 - 147 mEq/L   Potassium 4.6  3.7 - 5.3 mEq/L   Chloride 108  96 - 112 mEq/L   CO2 14 (*) 19 - 32 mEq/L   Glucose, Bld 204 (*) 70 - 99 mg/dL   BUN 12  6 - 23 mg/dL   Creatinine, Ser 0.73  0.50 - 1.35 mg/dL   Calcium 8.8  8.4 - 10.5 mg/dL   GFR calc non Af Amer >90  >90 mL/min   GFR calc Af Amer >90  >90 mL/min  CBC     Status: Abnormal   Collection Time    10/02/13  5:51 AM      Result Value Ref Range   WBC 10.6 (*) 4.0 - 10.5 K/uL   RBC 3.78 (*) 4.22 - 5.81 MIL/uL   Hemoglobin 11.5 (*) 13.0 - 17.0 g/dL   HCT 33.7 (*) 39.0 - 52.0 %   MCV 89.2  78.0 - 100.0 fL   MCH 30.4  26.0 - 34.0 pg   MCHC 34.1  30.0 - 36.0 g/dL   RDW 14.7  11.5 - 15.5 %   Platelets 152  150 - 400 K/uL    Signed: Ivin Poot, MD 10/02/2013, 1:43 PM   Time Spent on Discharge: 35 minutes Services Ordered on Discharge: none Equipment Ordered on Discharge: none

## 2013-10-02 NOTE — Progress Notes (Signed)
ANTICOAGULATION CONSULT NOTE - Follow-up  Pharmacy Consult for Coumadin Indication: h/o VTE  Allergies  Allergen Reactions  . Statins Other (See Comments)    Elevated liver enzymes.    Patient Measurements: Height: 6' (182.9 cm) Weight: 214 lb 1.1 oz (97.1 kg) IBW/kg (Calculated) : 77.6    Vital Signs: Temp: 98.3 F (36.8 C) (03/16 0900) Temp src: Oral (03/16 0900) BP: 99/64 mmHg (03/16 0900) Pulse Rate: 98 (03/16 0900)  Labs:  Recent Labs  09/30/13 0330 10/01/13 0325 10/01/13 0955 10/02/13 0551  HGB 11.2*  --  11.6* 11.5*  HCT 33.2*  --  34.4* 33.7*  PLT 121*  --  133* 152  LABPROT 28.5* 27.8*  --  25.8*  INR 2.80* 2.71*  --  2.45*  CREATININE 0.77  --  0.71 0.73    Estimated Creatinine Clearance: 124.5 ml/min (by C-G formula based on Cr of 0.73).  Medications:  Coumadin 2.5mg  daily  Assessment: dyspnea 69 yom presented to the ED with SOB. He is on chronic coumadin for hx DVT with initial plan to stop coumadin on 10/12/13. INR today remains therapeutic on 2.45 on the patients home dose of 2.5mg  daily. Patients Hgb stable, and plt wnl. No bleeding noted.  Goal of Therapy:  INR 2-3   Plan:  - Continue warfarin PO 2.5mg  daily - Monitor daily CBC, INR, and s/s bleeding  Aroush Chasse M. Posey Pronto, PharmD Clinical Pharmacist- Resident Pager: [phone] Pharmacy: [phone] 10/02/2013 11:04 AM

## 2013-10-02 NOTE — Discharge Summary (Signed)
INTERNAL MEDICINE ATTENDING DISCHARGE COSIGN   I am seeing this patient for the first time today. From chart review, he is a 58 year old male african [place] gentleman admitted with symptoms fitting SIRS criteria, with evidence of infection on CXR thus labeled sepsis secondary to CAP. He has made steady recovery on appropriate antibiotics, currently stable vitals and clear lung exam. He is being discharged today, and he looks forward to it.   I evaluated the patient on the day of discharge and discussed the discharge plan with my resident team (Dr Algis Liming and Dr Stann Mainland). I agree with the discharge documentation and disposition.   Madilyn Fireman 10/02/2013, 4:09 PM

## 2013-10-02 NOTE — Progress Notes (Signed)
Subjective: [name] is doing well this morning.  Denies cough, shortness of breath.  Sleeping and eating well.  Objective: Vital signs in last 24 hours: Filed Vitals:   10/01/13 1541 10/01/13 2032 10/01/13 2155 10/02/13 0523  BP:   138/89 149/92  Pulse:   100 97  Temp:   98.2 F (36.8 C) 98.1 F (36.7 C)  TempSrc:   Oral Oral  Resp:   18 18  Height:      Weight:   214 lb 1.1 oz (97.1 kg)   SpO2: 94% 94% 96% 98%   Weight change:   Intake/Output Summary (Last 24 hours) at 10/02/13 [id number] Last data filed at 10/02/13 0524  Gross per 24 hour  Intake 4328.75 ml  Output   3125 ml  Net 1203.75 ml    General: alert, cooperative, and in no apparent distress HEENT: NCAT, vision grossly intact, oropharynx clear and non-erythematous  Neck: supple, no lymphadenopathy Lungs: CTAB, normal work of respiration, no wheezes, rales, rhonchi Heart: regular rate and rhythm, no murmurs, gallops, or rubs Abdomen: soft, non-tender, non-distended, normal bowel sounds Extremities: 2+ DP/PT pulses bilaterally, no cyanosis, clubbing, or edema Neurologic: alert & oriented X3, cranial nerves II-XII intact, strength grossly intact, sensation intact to light touch   Lab Results: Basic Metabolic Panel:  Recent Labs Lab 10/01/13 0955 10/02/13 0551  NA 139 138  K 4.5 4.6  CL 108 108  CO2 15* 14*  GLUCOSE 93 204*  BUN 10 12  CREATININE 0.71 0.73  CALCIUM 8.7 8.8   CBC:  Recent Labs Lab 09/28/13 0505  10/01/13 0955 10/02/13 0551  WBC 11.4*  < > 13.4* 10.6*  NEUTROABS 9.0*  --   --   --   HGB 13.9  < > 11.6* 11.5*  HCT 40.9  < > 34.4* 33.7*  MCV 91.5  < > 90.5 89.2  PLT 123*  < > 133* 152  < > = values in this interval not displayed. Coagulation:  Recent Labs Lab 09/29/13 0300 09/30/13 0330 10/01/13 0325 10/02/13 0551  LABPROT 27.7* 28.5* 27.8* 25.8*  INR 2.69* 2.80* 2.71* 2.45*    Micro Results: Recent Results (from the past 240 hour(s))  CULTURE, BLOOD (ROUTINE X 2)      Status: None   Collection Time    09/28/13  4:48 AM      Result Value Ref Range Status   Specimen Description BLOOD RIGHT ANTECUBITAL   Final   Special Requests BOTTLES DRAWN AEROBIC ONLY 2CC   Final   Culture  Setup Time     Final   Value: 09/28/2013 08:10     Performed at [facility]   Culture     Final   Value:        BLOOD CULTURE RECEIVED NO GROWTH TO DATE CULTURE WILL BE HELD FOR 5 DAYS BEFORE ISSUING A FINAL NEGATIVE REPORT     Performed at [facility]   Report Status PENDING   Incomplete  CULTURE, BLOOD (ROUTINE X 2)     Status: None   Collection Time    09/28/13  5:05 AM      Result Value Ref Range Status   Specimen Description BLOOD FOREARM RIGHT   Final   Special Requests BOTTLES DRAWN AEROBIC ONLY 5CC   Final   Culture  Setup Time     Final   Value: 09/28/2013 08:10     Performed at [place]     Final  Value:        BLOOD CULTURE RECEIVED NO GROWTH TO DATE CULTURE WILL BE HELD FOR 5 DAYS BEFORE ISSUING A FINAL NEGATIVE REPORT     Performed at [facility]   Report Status PENDING   Incomplete  URINE CULTURE     Status: None   Collection Time    09/28/13  5:40 AM      Result Value Ref Range Status   Specimen Description URINE, CLEAN CATCH   Final   Special Requests NONE   Final   Culture  Setup Time     Final   Value: 09/28/2013 08:53     Performed at [facility]     Final   Value: NO GROWTH     Performed at [facility]   Culture     Final   Value: NO GROWTH     Performed at [facility]   Report Status 09/29/2013 FINAL   Final  MRSA PCR SCREENING     Status: None   Collection Time    09/28/13  1:39 PM      Result Value Ref Range Status   MRSA by PCR NEGATIVE  NEGATIVE Final   Comment:            The GeneXpert MRSA Assay (FDA     approved for NASAL specimens     only), is one component of a     comprehensive MRSA colonization     surveillance program. It is not      intended to diagnose MRSA     infection nor to guide or     monitor treatment for     MRSA infections.  CULTURE, EXPECTORATED SPUTUM-ASSESSMENT     Status: None   Collection Time    09/29/13  3:00 PM      Result Value Ref Range Status   Specimen Description SPUTUM   Final   Special Requests Immunocompromised   Final   Sputum evaluation     Final   Value: MICROSCOPIC FINDINGS SUGGEST THAT THIS SPECIMEN IS NOT REPRESENTATIVE OF LOWER RESPIRATORY SECRETIONS. PLEASE RECOLLECT.     CALLED TO M.HANCOCK,RN AT 2136 BY L.PITT 09/29/13   Report Status 09/29/2013 FINAL   Final    Medications: I have reviewed the patient's current medications. Scheduled Meds: . azithromycin  500 mg Intravenous Q24H  . cefTRIAXone (ROCEPHIN)  IV  1 g Intravenous Q24H  . Darunavir Ethanolate  800 mg Oral Q breakfast  . elvitegravir-cobicistat-emtricitabine-tenofovir  1 tablet Oral Q breakfast  . ipratropium-albuterol  3 mL Nebulization QID  . mometasone-formoterol  2 puff Inhalation BID  . QUEtiapine  150 mg Oral QHS  . vancomycin  1,250 mg Intravenous 3 times per day  . warfarin  2.5 mg Oral q1800  . Warfarin - Pharmacist Dosing Inpatient   Does not apply q1800   Continuous Infusions: . sodium chloride 1,000 mL (10/01/13 2312)   PRN Meds:.acetaminophen, acetaminophen, albuterol, ondansetron (ZOFRAN) IV, ondansetron Assessment/Plan: #Sepsis 2/2 CAP, resolved- Patient presented with fever (102.2), tachycardia, and tachypnea (3/4 SIRS criteria), with lingula/LLL pneumonia seen on CXR, consistent with CAP.  Lactic acid elevated to 3.0 --> 1.8.  Patient maintaining O2 sats on [place] now on RA, and mental status intact.  COPD exacerbation is less likely; although the patient does meet the cardinal criteria, these can also be explained by CAP, and no wheezing on exam with pCO2 at 20.9 thus no indication for steroids  at this time (and may cause harm given infection).  Unlikely PE given that patient therapeutic on warfarin.   Patient has now been afebrile since 4am on 4/12.  Urine strep pnuemo +.  Pt with improving leukocytosis 17 --> 13 --> 10.4 this AM.  Blood cultures NGTD.  Sputum culture inadequate. -discontinue vancomycin, ceftriaxone, azithromycin --> transition to levofloxacin PO 750 mg daily in anticipation of discharge late today or tomorrow -duonebs q6h prn   -RN to ambulate with pulse ox -repeat CXR in 6 weeks  #AG acidosis with respiratory alkalosis- Patient presented with AG = 15 and bicarb = 14, consistent with an elevated anion gap acidosis likely due to lactic acidosis (lactic acid = 3).  Additionally, patient's ABG showed pH 7.34, and pCO2 = 20.9.  Per Winter's formula, the expected pCO2 should be 27-31, indicating a co-existing respiratory alkalosis likely due to sepsis.  Salicylate negative.  AG 16 today with HCO3 14.  -BMP in AM or at follow-up -continue O2 to keep sats > 92%   #Sinus Tachycardia, improved- Patient presented with HR as high as 160 reported by EMS, now down to 90s.  During his last 4 office visits, HR has been in the 90-100s so may have baseline tachycardia.  Unlikely ACS causing new-onset tachyarrythmia, given lack of chest pain and labs.  Troponin x 1 negative.  UDS negative.  TSH within normal limits.   #HTN- history of HTN, on metoprolol and lisinopril at home.  Given clinical improvement and now hypertensive BPs, will add back antihypertensives today.    -restart metoprolol 25 mg BID and lisinopril 10 mg daily today   #HIV- last CD4 = 630 (04/04/13). Given such a high CD4, opportunistic infections as a cause of his pneumonia are unlikely.  -continue prezista, stribild   #COPD- chronic, see above -continue dulera, nebs   #History of DVT- unprovoked DVT 04/14/13, scheduled to stop warfarin 10/12/13. INR = 2.8 --> 2.69 --> 2.8 --> 2.7 --> 2.45 thus therapeutic.  -continue warfarin per pharmacy  Dispo: Disposition is deferred at this time, awaiting improvement of current medical  problems.  Anticipated discharge late today or tomorrow.   The patient does have a current PCP Jessee Avers, MD) and does need an [facility] hospital follow-up appointment after discharge.   .Services Needed at time of discharge: Y = Yes, Blank = No PT:   OT:   RN:   Equipment:   Other:     LOS: 4 days   Ivin Poot, MD 10/02/2013, 7:22 AM

## 2013-10-02 NOTE — Progress Notes (Signed)
Discharge instruction and d/c papers given to patient.Patient verbalized understanding.Prescription for levaquin already sent by MD to his pharmacy,pt made aware.Awaiting for ride home. Shelena Castelluccio Joselita,RN

## 2013-10-02 NOTE — Progress Notes (Addendum)
While at rest lying in bed O2 saturation 97% via room air with HR 88. While ambulating in halls approximately 72ft. O2 saturation 97% via room air with HR 108. No complaints. Tolerated well.

## 2013-10-04 ENCOUNTER — Encounter: Payer: Medicaid Other | Admitting: Internal Medicine

## 2013-10-04 LAB — CULTURE, BLOOD (ROUTINE X 2)
Culture: NO GROWTH
Culture: NO GROWTH

## 2013-10-09 ENCOUNTER — Ambulatory Visit (INDEPENDENT_AMBULATORY_CARE_PROVIDER_SITE_OTHER): Payer: Medicaid Other | Admitting: Pharmacist

## 2013-10-09 ENCOUNTER — Encounter: Payer: Self-pay | Admitting: Internal Medicine

## 2013-10-09 ENCOUNTER — Ambulatory Visit (INDEPENDENT_AMBULATORY_CARE_PROVIDER_SITE_OTHER): Payer: Medicaid Other | Admitting: Internal Medicine

## 2013-10-09 VITALS — BP 126/88 | HR 120 | Temp 97.0°F | Ht 72.0 in | Wt 211.0 lb

## 2013-10-09 DIAGNOSIS — Z7901 Long term (current) use of anticoagulants: Secondary | ICD-10-CM

## 2013-10-09 DIAGNOSIS — I82409 Acute embolism and thrombosis of unspecified deep veins of unspecified lower extremity: Secondary | ICD-10-CM

## 2013-10-09 DIAGNOSIS — I10 Essential (primary) hypertension: Secondary | ICD-10-CM

## 2013-10-09 DIAGNOSIS — Z86718 Personal history of other venous thrombosis and embolism: Secondary | ICD-10-CM

## 2013-10-09 DIAGNOSIS — Z8673 Personal history of transient ischemic attack (TIA), and cerebral infarction without residual deficits: Secondary | ICD-10-CM

## 2013-10-09 DIAGNOSIS — F17200 Nicotine dependence, unspecified, uncomplicated: Secondary | ICD-10-CM

## 2013-10-09 DIAGNOSIS — J189 Pneumonia, unspecified organism: Secondary | ICD-10-CM

## 2013-10-09 LAB — POCT INR: INR: 2.6

## 2013-10-09 MED ORDER — LISINOPRIL 10 MG PO TABS: 10.0000 mg | ORAL_TABLET | Freq: Every day | ORAL | Status: DC

## 2013-10-09 MED ORDER — PREDNISONE 50 MG PO TABS
50.0000 mg | ORAL_TABLET | Freq: Every day | ORAL | Status: DC
Start: 2013-10-09 — End: 2013-10-09

## 2013-10-09 MED ORDER — PREDNISONE 50 MG PO TABS: 50.0000 mg | ORAL_TABLET | Freq: Every day | ORAL | Status: DC

## 2013-10-09 NOTE — Assessment & Plan Note (Signed)
CXR for clearance of CAP at next visit in 4 weeks.

## 2013-10-09 NOTE — Assessment & Plan Note (Addendum)
Restarted lisinopril given no creatinine elevation in hospital. Recheck BMP in 4 weeks at follow up.

## 2013-10-09 NOTE — Progress Notes (Signed)
Case discussed with Dr. Doug Sou soon after the resident saw the patient.  We reviewed the resident's history and exam and pertinent patient test results.  I agree with the assessment, diagnosis and plan of care documented in the resident's note.

## 2013-10-09 NOTE — Progress Notes (Signed)
Anti-Coagulation Progress Note  Jacob Joseph is a 58 y.o. male who is currently on an anti-coagulation regimen.    RECENT RESULTS: Recent results are below, the most recent result is correlated with a dose of 17.5 mg. per week: There is documented in several places (problem list; hospital care notes) of a planned discontinuation of warfarin, the patient having soon reached his 6 months duration of therapy from his index VTE (showing a discontinuation date of 26-MAR-15). Dr. Doug Sou was seeing patient today in a post-hospital follow-up visit and asked if this should be continued or discontinued as documented. As far as I can determine--this was adjudicated as an unprovoked VTE (hence the decision to possibly discontinue at 4mo---versus 3 months which would be the recommendation from CHEST for a known caused/provoked DVT). We note that the patient continues to take megesterol acetate/Megace. One could look upon this as a provocation to his initial index event. Additionally, with a most recent hospitalization with an acute infectious disease event--these are indeed criteria observed from the MEDENOX Trial that places patients at risk/continued risk. Given a documented history of CVA (uncertain as to whether neurology wanted him to remain on warfarin versus an antiplatelet agent, e.g. ASA, etc) this may be a continued indication for warfarin as well. Based upon our discussion (Dr. Doug Sou and myself), we elect to continue the warfarin indefinitely but with the important caveat of at least annually re-visiting this issue and as always weighing the balance of continued benefit versus risks--and any events that may help in the determination, etc.---and with the "advice and consent of the patient" as stipulated in CHEST.  Lab Results  Component Value Date   INR 2.60 10/09/2013   INR 2.45* 10/02/2013   INR 2.71* 10/01/2013    ANTI-COAG DOSE: Anticoagulation Dose Instructions as of 10/09/2013     [name] Tue Wed  Thu Fri Sat   New Dose 2.5 mg 2.5 mg 2.5 mg 2.5 mg 2.5 mg 2.5 mg 2.5 mg       ANTICOAG SUMMARY: Anticoagulation Episode Summary   Current INR goal 2.0-3.0  Next INR check 10/23/2013  INR from last check 2.60 (10/09/2013)  Weekly max dose   Target end date Indefinite  [facility]  Preferred lab   Send INR reminders to    Indications  History of CVA [V12.54] Warfarin anticoagulation [V58.61]        Comments         ANTICOAG TODAY: Anticoagulation Summary as of 10/09/2013   INR goal 2.0-3.0  Selected INR 2.60 (10/09/2013)  Next INR check 10/23/2013  Target end date Indefinite   Indications  History of CVA [V12.54] Warfarin anticoagulation [V58.61]      Anticoagulation Episode Summary   [facility]   Preferred lab    Send INR reminders to    Comments       PATIENT INSTRUCTIONS: Patient Instructions  Patient instructed to take medications as defined in the Anti-coagulation Track section of this encounter.  Patient instructed to take today's dose.  Patient verbalized understanding of these instructions.       FOLLOW-UP Return in 2 weeks (on 10/23/2013) for Follow up INR at 1100h.  [name], III Pharm.D., CACP

## 2013-10-09 NOTE — Progress Notes (Signed)
Indication: Venous thromboembolism.  Duration: Awaiting patient input into decision to continue.  INR: At target.  Agree with Dr. Gladstone Pih assessment and plan.

## 2013-10-09 NOTE — Assessment & Plan Note (Signed)
He has currently stopped and I congratulated that. Encouraged him to stay smoke-free.

## 2013-10-09 NOTE — Patient Instructions (Signed)
We will have you take prednisone 1 pill per day for 5 days to help with your breathing.  We will see you back in 4-6 weeks to check on your lungs.  If your breathing gets worse before then please call us at [phone] to come in sooner.  Chronic Obstructive Pulmonary Disease Chronic obstructive pulmonary disease (COPD) is a common lung problem. In COPD, the flow of air from the lungs is limited. The way your lungs work will probably never return to normal, but there are things you can do to improve you lungs and make yourself feel better. HOME CARE  Take all medicines as told by your doctor.  Only take over-the-counter or prescription medicines as told by your doctor.  Avoid medicines or cough syrups that dry up your airway (such as antihistamines) and do not allow you to get rid of thick spit. You do not need to avoid them if told differently by your doctor.  If you smoke, stop. Smoking makes the problem worse.  Avoid being around things that make your breathing worse (like smoke, chemicals, and fumes).  Use oxygen therapy and therapy to help improve your lungs (pulmonary rehabilitation) if told by your doctor. If you need home oxygen therapy, ask your doctor if you should buy a tool to measure your oxygen level (oximeter).  Avoid people who have a sickness you can catch (contagious).  Avoid going outside when it is very hot, cold, or humid.  Eat healthy foods. Eat smaller meals more often. Rest before meals.  Stay active, but remember to also rest.  Make sure to get all the shots (vaccines) your doctor recommends. Ask your doctor if you need a pneumonia shot.  Learn and use tips on how to relax.  Learn and use tips on how to control your breathing as told by your doctor. Try:  Breathing in (inhaling) through your nose for 1 second. Then, pucker your lips and breath out (exhale) through your lips for 2 seconds.  Putting one hand on your belly (abdomen). Breathe in slowly  through your nose for 1 second. Your hand on your belly should move out. Pucker your lips and breathe out slowly through your lips. Your hand on your belly should move in as you breathe out.  Learn and use controlled coughing to clear thick spit from your lungs. 1. Lean your head a little forward. 2. Breathe in deeply. 3. Try to hold your breath for 3 seconds. 4. Keep your mouth slightly open while coughing 2 times. 5. Spit any thick spit out into a tissue. 6. Rest and do the steps again 1 or 2 times as needed. GET HELP IF:  You cough up more thick spit than usual.  There is a change in the color or thickness of the spit.  It is harder to breathe than usual.  Your breathing is faster than usual. GET HELP RIGHT AWAY IF:   You have shortness of breath while resting.  You have shortness of breath that stops you from:  Being able to talk.  Doing normal activities.  You chest hurts for longer than 5 minutes.  Your skin color is more blue than usual.  Your pulse oximeter shows that you have low oxygen for longer than 5 minutes. MAKE SURE YOU:   Understand these instructions.  Will watch your condition.  Will get help right away if you are not doing well or get worse. Document Released: 12/23/2007 Document Revised: 04/26/2013 Document Reviewed: 03/02/2013 ExitCare Patient Information  2014 [place], [place].

## 2013-10-09 NOTE — Assessment & Plan Note (Signed)
No documentation of discussion with patient stop date. Reported as unprovoked although patient is a smoker and takes megesterol. Given ongoing usage of this medication did NOT recommend stop date 10/12/13 and discussed same with Dr. Elie Confer (pharmacist in charge of coumadin clinic). Will forward to PCP for further review but Dr. Elie Confer feels 1 year therapy would be minimum and I would have concern with ongoing megesterol usage for repeat event.

## 2013-10-09 NOTE — Patient Instructions (Signed)
Patient instructed to take medications as defined in the Anti-coagulation Track section of this encounter.  Patient instructed to take today's dose.  Patient verbalized understanding of these instructions.    

## 2013-10-09 NOTE — Progress Notes (Signed)
Subjective:     Patient ID: Jacob Joseph, male   DOB: 02-28-1956, 58 y.o.   MRN: [id number]  HPI The patient is a 58 YO man coming in today for hospital follow up. He was recently hospitalized for CAP with PMH of COPD, HIV (well controlled), DM type 2, CKD (reported stage 2 in chart), recent DVT last September, history of CVA (cuase unknown), HTN, tobacco abuse. He was discharged on levaquin and was able to fill that. He has one dose left and is feeling better. He is not back to normal and still feels a little weak after leaving the hospital. He has quit smoking in the last week and feels as though he is still coughing which he thought would stop.    Review of Systems  Constitutional: Positive for activity change. Negative for fever, chills, diaphoresis, appetite change, fatigue and unexpected weight change.       Doing less than before hospitalization  Respiratory: Positive for cough and shortness of breath. Negative for chest tightness and wheezing.        Some SOB with activity  Cardiovascular: Negative for chest pain, palpitations and leg swelling.  Gastrointestinal: Negative for nausea, vomiting, abdominal pain, diarrhea and constipation.       Occasional loose stools unchanged from before hospitalization  Endocrine: Negative for polydipsia and polyuria.  Neurological: Negative for dizziness, tremors, seizures, syncope, facial asymmetry, speech difficulty, weakness, light-headedness, numbness and headaches.       Objective:   Physical Exam  Constitutional: He is oriented to person, place, and time. He appears well-developed and well-nourished. No distress.  Thin  HENT:  Head: Normocephalic and atraumatic.  Eyes: EOM are normal.  Neck: Normal range of motion. Neck supple. No JVD present. No tracheal deviation present. No thyromegaly present.  Cardiovascular: Regular rhythm.   Slightly fast, HR 90s during exam  Pulmonary/Chest: Effort normal. No respiratory distress. He has wheezes.  He has no rales. He exhibits no tenderness.  Some crackles in left lung with expiratory wheezing.  Abdominal: Soft. Bowel sounds are normal. He exhibits no distension. There is no tenderness. There is no rebound and no guarding.  Musculoskeletal: Normal range of motion. He exhibits no edema.  Lymphadenopathy:    He has no cervical adenopathy.  Neurological: He is oriented to person, place, and time.  Skin: Skin is warm and dry. He is not diaphoretic.       Assessment/Plan:   1. Hospital Follow up - Patient is doing better and is due for repeat CXR in about 4 weeks and will note in problem oriented charting. Will give prednisone for 5 days due to wheezing on exam with COPD. BP controlled. Will not advise coumadin cessation as there is some discrepancy in charting. PCP has never addressed stop date and patient is unclear. Dr. Gladstone Pih last note indicates indefinite therapy. Will forward to PCP to sort through although would recommend with patient's smoking and megesterol therapy ongoing would not stop coumadin until minimum of 1 year therapy.   2. Disposition - Coumadin clinic visit today. Will restart lisinopril 10 mg daily for his diabetes and CKD today. Follow up in 4-6 weeks with PCP to recheck BMP/CXR and address coumadin/DVT.

## 2013-10-16 ENCOUNTER — Ambulatory Visit (INDEPENDENT_AMBULATORY_CARE_PROVIDER_SITE_OTHER): Payer: Medicaid Other | Admitting: Internal Medicine

## 2013-10-16 ENCOUNTER — Encounter: Payer: Self-pay | Admitting: Internal Medicine

## 2013-10-16 VITALS — BP 128/89 | HR 125 | Temp 97.8°F | Wt 208.0 lb

## 2013-10-16 DIAGNOSIS — Z113 Encounter for screening for infections with a predominantly sexual mode of transmission: Secondary | ICD-10-CM

## 2013-10-16 DIAGNOSIS — B20 Human immunodeficiency virus [HIV] disease: Secondary | ICD-10-CM

## 2013-10-16 DIAGNOSIS — Z79899 Other long term (current) drug therapy: Secondary | ICD-10-CM

## 2013-10-16 NOTE — Progress Notes (Signed)
Patient ID: Jacob Joseph, male   DOB: [birth year], 58 y.o.   MRN: [id number] @LOGODEPT         Patient Active Problem List   Diagnosis Date Noted  . HIV INFECTION 04/27/2006    Priority: High  . Polysubstance abuse 04/27/2006    Priority: High  . CAP (community acquired pneumonia) 09/28/2013  . COPD, moderate 05/31/2013  . Smoking greater than 20 pack years 05/31/2013  . Bilateral lower extremity pain 05/08/2013  . Loss of weight 04/24/2013  . DVT, lower extremity 04/18/2013  . Warfarin anticoagulation 04/17/2013  . Complex Lt Renal Cyst 03/22/2013  . Preventive measure 05/30/2012  . History of CVA 03/08/2012  . Normocytic anemia 03/08/2012  . Leukopenia 03/08/2012  . CKD Stage 2 03/08/2012  . ERECTILE DYSFUNCTION 04/25/2008  . INSOMNIA, CHRONIC 06/14/2007  . HEPATITIS C 04/27/2006  . TOBACCO USER 04/27/2006  . DEPRESSION 04/27/2006  . HYPERTENSION 04/27/2006  . DEGENERATIVE JOINT DISEASE 04/27/2006  . Chronic Low Back Pain 04/27/2006    Patient's Medications  New Prescriptions   No medications on file  Previous Medications   ALBUTEROL (PROVENTIL HFA;VENTOLIN HFA) 108 (90 BASE) MCG/ACT INHALER    Inhale 2 puffs into the lungs every 6 (six) hours as needed for wheezing or shortness of breath.   DARUNAVIR ETHANOLATE (PREZISTA) 800 MG TABLET    Take 1 tablet (800 mg total) by mouth daily with breakfast.   ELVITEGRAVIR-COBICISTAT-EMTRICITABINE-TENOFOVIR (STRIBILD) 150-150-200-300 MG TABS    Take 1 tablet by mouth daily with breakfast.   FLUTICASONE-SALMETEROL (ADVAIR DISKUS) 100-50 MCG/DOSE AEPB    Inhale 1 puff into the lungs 2 (two) times daily.   LEVOFLOXACIN (LEVAQUIN) 750 MG TABLET    Take 1 tablet (750 mg total) by mouth daily.   LISINOPRIL (PRINIVIL,ZESTRIL) 10 MG TABLET    Take 1 tablet (10 mg total) by mouth daily.   MEGESTROL (MEGACE ORAL) 40 MG/ML SUSPENSION    Take 20 mLs (800 mg total) by mouth daily.   MIRTAZAPINE (REMERON) 15 MG TABLET    Take 15 mg by mouth at  bedtime.   NUTRITIONAL SUPPLEMENTS (ENSURE HIGH PROTEIN) LIQD    Take 1 Can by mouth 2 (two) times daily.   PREDNISONE (DELTASONE) 50 MG TABLET    Take 1 tablet (50 mg total) by mouth daily with breakfast.   QUETIAPINE (SEROQUEL) 100 MG TABLET    Take 150 mg by mouth at bedtime.   TIOTROPIUM (SPIRIVA HANDIHALER) 18 MCG INHALATION CAPSULE    Place 1 capsule (18 mcg total) into inhaler and inhale daily.   WARFARIN (COUMADIN) 5 MG TABLET    Take 2.5 mg by mouth daily.   ZOLPIDEM (AMBIEN) 10 MG TABLET    Take 10 mg by mouth at bedtime as needed. For sleep  Modified Medications   No medications on file  Discontinued Medications   No medications on file    Subjective: Burrell is in with his wife, Arelia Sneddon, for his routine visit. He was recently hospitalized with community acquired pneumonia from March 12-16. He is feeling better but still feels globally weaker than before he developed pneumonia. He states that he has persistent right-sided weakness and generalized weakness following his stroke over one year ago. He's been having some recent falls at home. [name] states that he "stumbles" all the time and will not tell his doctor. He cannot get his walker into his bathroom and has to lean on his dresser and the wall for support. He had a fall this morning when  trying to get to the bathroom. He denies dizziness or loss of consciousness. He did not have any injury. He does not think he is missed any doses of his Stribild or Prezista. He is using a pill box.  Review of Systems: Pertinent items are noted in HPI.  Past Medical History  Diagnosis Date  . Hypertension   . HIV (human immunodeficiency virus infection)   . Depression   . Stroke 11/2011    Carotids Doppler negative. Right sided weakness resolved, initially on Plavix for 3 months and then continued with ASA.    . TB lung, latent 1988    Treated  . Calcium oxalate crystals in urine 12/23/2011    Asymptomatic, no hematuria. Advised to take plenty  of water.  . Hepatitis C     History  Substance Use Topics  . Smoking status: Former Smoker -- 0.50 packs/day for 41 years    Types: Cigarettes    Quit date: 04/18/2013  . Smokeless tobacco: Never Used  . Alcohol Use: No    Family History  Problem Relation Age of Onset  . Hypertension Mother   . Hypertension Father   . Cancer Father     Allergies  Allergen Reactions  . Statins Other (See Comments)    Elevated liver enzymes.    Objective: Temp: 97.8 F (36.6 C) (03/30 1432) Temp src: Oral (03/30 1432) BP: 128/89 mmHg (03/30 1432) Pulse Rate: 125 (03/30 1432) Body mass index is 28.2 kg/(m^2).  General:  he was alert and in no distress Oral:  no oropharyngeal lesions Skin:  chronic dry skin on his right hand Lungs:  clear Cor:  regular S1 and S2. Slightly tachycardic after walking to the bathroom and back Joints and extremities:  no acute abnormalities Neuro:  he can stand without assistance. He has persistent right hand and arm atrophy. He has a slow shuffling gait using his walker mood and affect: Normal and appropriate  Lab Results Lab Results  Component Value Date   WBC 10.6* 10/02/2013   HGB 11.5* 10/02/2013   HCT 33.7* 10/02/2013   MCV 89.2 10/02/2013   PLT 152 10/02/2013    Lab Results  Component Value Date   CREATININE 0.73 10/02/2013   BUN 12 10/02/2013   NA 138 10/02/2013   K 4.6 10/02/2013   CL 108 10/02/2013   CO2 14* 10/02/2013    Lab Results  Component Value Date   ALT 64* 09/28/2013   AST 57* 09/28/2013   ALKPHOS 106 09/28/2013   BILITOT 0.6 09/28/2013    Lab Results  Component Value Date   CHOL 110 04/04/2013   HDL 43 04/04/2013   LDLCALC 44 04/04/2013   TRIG 113 04/04/2013   CHOLHDL 2.6 04/04/2013    Lab Results HIV 1 RNA Quant (copies/mL)  Date Value  04/04/2013 24*  12/20/2012 <20   09/07/2012 <20      CD4 T Cell Abs (/uL)  Date Value  04/04/2013 630   12/20/2012 420   09/07/2012 470      Assessment: His adherence with his  antiretroviral medication is good. I will repeat his lab work today and continue his current regimen.  He is multifactorial weakness and some recent falls. I will see if we can arrange a physical therapy evaluation.  Plan: 1. continue current antiretroviral regimen 2. check lab work today 3. physical therapy evaluation 4. Followup in 6 weeks   Michel Bickers, MD [facility] for [place] [phone] pager  [phone] cell 10/16/2013, 3:03 PM

## 2013-10-17 ENCOUNTER — Encounter: Payer: Self-pay | Admitting: Internal Medicine

## 2013-10-17 ENCOUNTER — Telehealth: Payer: Self-pay | Admitting: Internal Medicine

## 2013-10-17 ENCOUNTER — Ambulatory Visit (INDEPENDENT_AMBULATORY_CARE_PROVIDER_SITE_OTHER): Payer: Medicaid Other | Admitting: Internal Medicine

## 2013-10-17 VITALS — BP 130/85 | HR 124 | Temp 95.8°F | Ht 72.0 in | Wt 215.5 lb

## 2013-10-17 DIAGNOSIS — T380X5A Adverse effect of glucocorticoids and synthetic analogues, initial encounter: Secondary | ICD-10-CM

## 2013-10-17 DIAGNOSIS — R739 Hyperglycemia, unspecified: Secondary | ICD-10-CM | POA: Insufficient documentation

## 2013-10-17 DIAGNOSIS — E099 Drug or chemical induced diabetes mellitus without complications: Secondary | ICD-10-CM

## 2013-10-17 DIAGNOSIS — J4489 Other specified chronic obstructive pulmonary disease: Secondary | ICD-10-CM

## 2013-10-17 DIAGNOSIS — T380X1A Poisoning by glucocorticoids and synthetic analogues, accidental (unintentional), initial encounter: Secondary | ICD-10-CM

## 2013-10-17 DIAGNOSIS — E139 Other specified diabetes mellitus without complications: Secondary | ICD-10-CM

## 2013-10-17 DIAGNOSIS — J449 Chronic obstructive pulmonary disease, unspecified: Secondary | ICD-10-CM

## 2013-10-17 DIAGNOSIS — R7309 Other abnormal glucose: Secondary | ICD-10-CM

## 2013-10-17 LAB — COMPREHENSIVE METABOLIC PANEL
ALT: 99 U/L — ABNORMAL HIGH (ref 0–53)
AST: 112 U/L — ABNORMAL HIGH (ref 0–37)
Albumin: 3.4 g/dL — ABNORMAL LOW (ref 3.5–5.2)
Alkaline Phosphatase: 128 U/L — ABNORMAL HIGH (ref 39–117)
BILIRUBIN TOTAL: 0.4 mg/dL (ref 0.2–1.2)
BUN: 20 mg/dL (ref 6–23)
CO2: 15 mEq/L — ABNORMAL LOW (ref 19–32)
CREATININE: 1.27 mg/dL (ref 0.50–1.35)
Calcium: 9.7 mg/dL (ref 8.4–10.5)
Chloride: 106 mEq/L (ref 96–112)
GLUCOSE: 641 mg/dL — AB (ref 70–99)
Potassium: 4.5 mEq/L (ref 3.5–5.3)
Sodium: 132 mEq/L — ABNORMAL LOW (ref 135–145)
Total Protein: 7.6 g/dL (ref 6.0–8.3)

## 2013-10-17 LAB — BASIC METABOLIC PANEL
BUN: 22 mg/dL (ref 6–23)
CALCIUM: 8.9 mg/dL (ref 8.4–10.5)
CHLORIDE: 101 meq/L (ref 96–112)
CO2: 13 meq/L — AB (ref 19–32)
CREATININE: 0.94 mg/dL (ref 0.50–1.35)
GLUCOSE: 523 mg/dL — AB (ref 70–99)
Potassium: 4.5 mEq/L (ref 3.5–5.3)
Sodium: 132 mEq/L — ABNORMAL LOW (ref 135–145)

## 2013-10-17 LAB — CBC
HCT: 42.4 % (ref 39.0–52.0)
HEMOGLOBIN: 13.6 g/dL (ref 13.0–17.0)
MCH: 30.6 pg (ref 26.0–34.0)
MCHC: 32.1 g/dL (ref 30.0–36.0)
MCV: 95.3 fL (ref 78.0–100.0)
Platelets: 226 10*3/uL (ref 150–400)
RBC: 4.45 MIL/uL (ref 4.22–5.81)
RDW: 14.2 % (ref 11.5–15.5)
WBC: 12.8 10*3/uL — ABNORMAL HIGH (ref 4.0–10.5)

## 2013-10-17 LAB — HIV-1 RNA QUANT-NO REFLEX-BLD
HIV 1 RNA Quant: <20 copies/mL (ref <20)
HIV-1 RNA Quant, Log: <1.3 {Log} (ref <1.30)

## 2013-10-17 LAB — LIPID PANEL
CHOL/HDL RATIO: 3.2 ratio
CHOLESTEROL: 120 mg/dL (ref 0–200)
HDL: 38 mg/dL — AB (ref >39)
LDL Cholesterol: 58 mg/dL (ref 0–99)
Triglycerides: 120 mg/dL (ref <150)
VLDL: 24 mg/dL (ref 0–40)

## 2013-10-17 LAB — T-HELPER CELL (CD4) - (RCID CLINIC ONLY)
CD4 % Helper T Cell: 12 % — ABNORMAL LOW (ref 33–55)
CD4 T Cell Abs: 230 /uL — ABNORMAL LOW (ref 400–2700)

## 2013-10-17 LAB — RPR

## 2013-10-17 MED ORDER — SODIUM CHLORIDE 0.9 % IV BOLUS (SEPSIS)
500.0000 mL | Freq: Once | INTRAVENOUS | Status: AC
  Administered 2013-10-17: 500 mL via INTRAVENOUS

## 2013-10-17 NOTE — Patient Instructions (Addendum)
Please return to clinic in 2 days to have your blood sugar rechecked since you just finished the prednisone today. Please bring your medicines with you each time you come.   Medicines may be  Eye drops  Herbal   Vitamins  Pills  Seeing these help [place] take care of you.  Hyperglycemia Hyperglycemia occurs when the glucose (sugar) in your blood is too high. Hyperglycemia can happen for many reasons, but it most often happens to people who do not know they have diabetes or are not managing their diabetes properly.  CAUSES  Whether you have diabetes or not, there are other causes of hyperglycemia. Hyperglycemia can occur when you have diabetes, but it can also occur in other situations that you might not be as aware of, such as: Diabetes  If you have diabetes and are having problems controlling your blood glucose, hyperglycemia could occur because of some of the following reasons:  Not following your meal plan.  Not taking your diabetes medications or not taking it properly.  Exercising less or doing less activity than you normally do.  Being sick. Pre-diabetes  This cannot be ignored. Before people develop Type 2 diabetes, they almost always have "pre-diabetes." This is when your blood glucose levels are higher than normal, but not yet high enough to be diagnosed as diabetes. Research has shown that some long-term damage to the body, especially the heart and circulatory system, may already be occurring during pre-diabetes. If you take action to manage your blood glucose when you have pre-diabetes, you may delay or prevent Type 2 diabetes from developing. Stress  If you have diabetes, you may be "diet" controlled or on oral medications or insulin to control your diabetes. However, you may find that your blood glucose is higher than usual in the hospital whether you have diabetes or not. This is often referred to as "stress hyperglycemia." Stress can elevate your blood glucose. This  happens because of hormones put out by the body during times of stress. If stress has been the cause of your high blood glucose, it can be followed regularly by your caregiver. That way he/she can make sure your hyperglycemia does not continue to get worse or progress to diabetes. Steroids  Steroids are medications that act on the infection fighting system (immune system) to block inflammation or infection. One side effect can be a rise in blood glucose. Most people can produce enough extra insulin to allow for this rise, but for those who cannot, steroids make blood glucose levels go even higher. It is not unusual for steroid treatments to "uncover" diabetes that is developing. It is not always possible to determine if the hyperglycemia will go away after the steroids are stopped. A special blood test called an A1c is sometimes done to determine if your blood glucose was elevated before the steroids were started. SYMPTOMS  Thirsty.  Frequent urination.  Dry mouth.  Blurred vision.  Tired or fatigue.  Weakness.  Sleepy.  Tingling in feet or leg. DIAGNOSIS  Diagnosis is made by monitoring blood glucose in one or all of the following ways:  A1c test. This is a chemical found in your blood.  Fingerstick blood glucose monitoring.  Laboratory results. TREATMENT  First, knowing the cause of the hyperglycemia is important before the hyperglycemia can be treated. Treatment may include, but is not be limited to:  Education.  Change or adjustment in medications.  Change or adjustment in meal plan.  Treatment for an illness, infection, etc.  More frequent blood glucose monitoring.  Change in exercise plan.  Decreasing or stopping steroids.  Lifestyle changes. HOME CARE INSTRUCTIONS   Test your blood glucose as directed.  Exercise regularly. Your caregiver will give you instructions about exercise. Pre-diabetes or diabetes which comes on with stress is helped by  exercising.  Eat wholesome, balanced meals. Eat often and at regular, fixed times. Your caregiver or nutritionist will give you a meal plan to guide your sugar intake.  Being at an ideal weight is important. If needed, losing as little as 10 to 15 pounds may help improve blood glucose levels. SEEK MEDICAL CARE IF:   You have questions about medicine, activity, or diet.  You continue to have symptoms (problems such as increased thirst, urination, or weight gain). SEEK IMMEDIATE MEDICAL CARE IF:   You are vomiting or have diarrhea.  Your breath smells fruity.  You are breathing faster or slower.  You are very sleepy or incoherent.  You have numbness, tingling, or pain in your feet or hands.  You have chest pain.  Your symptoms get worse even though you have been following your caregiver's orders.  If you have any other questions or concerns. Document Released: 12/30/2000 Document Revised: 09/28/2011 Document Reviewed: 11/02/2011 [facility] Patient Information 2014 [place], [place].

## 2013-10-17 NOTE — Telephone Encounter (Signed)
  INTERNAL MEDICINE RESIDENCY PROGRAM After-Hours Telephone Call    Reason for call:   I received a call from lab [facility] about an elevated CBG of 641. Patient had a routine follow up at the ID office today. His CMP was checked and showed CBG of 641. AG is 11 and he has a chronic low CO2 of 14-15.      Assessment/ Plan:   Hyperglycemia vs hyperosmolar hyperglycemic state        Patient has a T2DM well controlled with lifestyle changes. He was recently given prednisone 50 mg po daily x 5 days for wheezing during his office visit on 10/09/13, which could be associated with his elevated CBG.        I have called his cellphone and home phone and left message for him to recheck his CBG and page me back. I will attempt to call him in the am as well.     A note is sent to the clinic for the front desk to call him and schedule an appt this am.     Charlann Lange, MD   10/17/2013, 4:07 AM

## 2013-10-17 NOTE — Progress Notes (Signed)
   Subjective:    Patient ID: Jacob Joseph, male    DOB: Aug 02, 1955, 58 y.o.   MRN: [id number]  HPI  58 yo with hx significant for well controlled HIV, hypertension, COPD, CKD, chronic Coumadin tx secondary to DVTs and recent Community Acquired Pneumonia.  He had hospital f/u ~ 1 week ago and was noted to have wheezes on lung exam and subsequently prescribed prednisone 50 mg x 5 days.  He was evaluated in [place] clinic 10/16/2013 with lab work notable for glucose of 641. He presents today for further assessment of hyperglycemia.  Review of Systems  Constitutional: Negative.   HENT: Negative.   Eyes: Negative.   Respiratory: Negative.   Cardiovascular: Negative.   Gastrointestinal: Negative.   Endocrine: Positive for polyuria. Negative for polydipsia.  Genitourinary: Positive for frequency. Negative for dysuria.  Musculoskeletal: Negative.   Skin: Negative.   Allergic/Immunologic: Positive for immunocompromised state.       HIV  Neurological: Negative.   Hematological: Does not bruise/bleed easily.  Psychiatric/Behavioral: Negative.        Objective:   Physical Exam  Constitutional: He is oriented to person, place, and time. He appears well-developed and well-nourished.  In wheelchair, wife present  HENT:  Head: Normocephalic and atraumatic.  Eyes: Conjunctivae and EOM are normal. Pupils are equal, round, and reactive to light.  Neck: Normal range of motion. Neck supple.  Cardiovascular: Normal rate, regular rhythm, normal heart sounds and intact distal pulses.   Pulmonary/Chest: Effort normal and breath sounds normal. He has no wheezes.  Abdominal: Soft. Bowel sounds are normal.  Musculoskeletal: Normal range of motion.  Neurological: He is alert and oriented to person, place, and time.  Skin: Skin is warm and dry.  Psychiatric: He has a normal mood and affect.          Assessment & Plan:  See separate problem-list charting for full A/P.  1. Steroid induced  Hyperglycemia: likely prediabetic state prior to course of steroids, pt not totally clear if and when he completed prednisone but thinks that he may have taken the last one this morning -check cbg today-->503 -NS bolus 500cc -f/u 2 days

## 2013-10-17 NOTE — Telephone Encounter (Signed)
Received message from Dr Nicoletta Dress. Pt called several times this morning on both numbers.  Message left for him to call clinic with regard to labs from yesterday. Will wait for return call. Dr Eppie Gibson aware.

## 2013-10-17 NOTE — Telephone Encounter (Signed)
I tried calling pt's contact # ( family member) He will go to pt's house and ask him to call me in the clinic.

## 2013-10-17 NOTE — Assessment & Plan Note (Addendum)
Glucose ~640 after 5 days of prednisone therapy (50 mg qd x 5 days) for COPD. Anion Gap 11. Pt asymptomatic other than polyuria. Cbg 503 today. Pt with likely prediabetic state given review of prior glucose levels ranging 151-208 prior to the course of prednisone.  -will defer insulin administration today as pt and wife are unclear when the prednisone was completed.  Pt states that "now that he thinks about it" he thinks that he took the last one this morning.  Wife thinks that he finished "days ago". -NS bolus today, pt to return to clinic in 2 days for reassessment and medication bottles.

## 2013-10-18 LAB — GLUCOSE, CAPILLARY: Glucose-Capillary: 503 mg/dL — ABNORMAL HIGH (ref 70–99)

## 2013-10-19 ENCOUNTER — Encounter: Payer: Self-pay | Admitting: Internal Medicine

## 2013-10-19 ENCOUNTER — Ambulatory Visit (INDEPENDENT_AMBULATORY_CARE_PROVIDER_SITE_OTHER): Payer: Medicaid Other | Admitting: Internal Medicine

## 2013-10-19 ENCOUNTER — Ambulatory Visit (HOSPITAL_COMMUNITY)
Admission: RE | Admit: 2013-10-19 | Discharge: 2013-10-19 | Disposition: A | Discharge status: Home / Self Care | Payer: Medicaid Other | Source: Ambulatory Visit | Attending: Internal Medicine | Admitting: Internal Medicine

## 2013-10-19 VITALS — BP 117/83 | HR 116 | Temp 97.5°F | Ht 72.0 in | Wt 212.1 lb

## 2013-10-19 DIAGNOSIS — I498 Other specified cardiac arrhythmias: Secondary | ICD-10-CM

## 2013-10-19 DIAGNOSIS — Z8673 Personal history of transient ischemic attack (TIA), and cerebral infarction without residual deficits: Secondary | ICD-10-CM

## 2013-10-19 DIAGNOSIS — Z7984 Long term (current) use of oral hypoglycemic drugs: Secondary | ICD-10-CM

## 2013-10-19 DIAGNOSIS — R000 Tachycardia, unspecified: Secondary | ICD-10-CM | POA: Insufficient documentation

## 2013-10-19 DIAGNOSIS — R7309 Other abnormal glucose: Secondary | ICD-10-CM

## 2013-10-19 DIAGNOSIS — E881 Lipodystrophy, not elsewhere classified: Secondary | ICD-10-CM

## 2013-10-19 DIAGNOSIS — E119 Type 2 diabetes mellitus without complications: Secondary | ICD-10-CM | POA: Insufficient documentation

## 2013-10-19 DIAGNOSIS — M545 Low back pain, unspecified: Secondary | ICD-10-CM

## 2013-10-19 DIAGNOSIS — M5137 Other intervertebral disc degeneration, lumbosacral region: Secondary | ICD-10-CM

## 2013-10-19 DIAGNOSIS — J189 Pneumonia, unspecified organism: Secondary | ICD-10-CM

## 2013-10-19 DIAGNOSIS — R739 Hyperglycemia, unspecified: Secondary | ICD-10-CM

## 2013-10-19 LAB — POCT GLYCOSYLATED HEMOGLOBIN (HGB A1C): Hemoglobin A1C: 7.5

## 2013-10-19 LAB — GLUCOSE, CAPILLARY: Glucose-Capillary: 219 mg/dL — ABNORMAL HIGH (ref 70–99)

## 2013-10-19 MED ORDER — METFORMIN HCL 500 MG PO TABS: 500.0000 mg | ORAL_TABLET | Freq: Two times a day (BID) | ORAL | Status: DC

## 2013-10-19 MED ORDER — METOPROLOL TARTRATE 25 MG PO TABS: 12.5000 mg | ORAL_TABLET | Freq: Two times a day (BID) | ORAL | Status: DC

## 2013-10-19 MED ORDER — LISINOPRIL 5 MG PO TABS: 10.0000 mg | ORAL_TABLET | Freq: Every day | ORAL | Status: DC

## 2013-10-19 MED ORDER — LISINOPRIL 5 MG PO TABS: 5.0000 mg | ORAL_TABLET | Freq: Every day | ORAL | Status: DC

## 2013-10-19 NOTE — Patient Instructions (Addendum)
General Instructions: Follow up with the heart doctor  Follow up with Internal Medicine in May (Dr. Raliegh Ip) Get a Chest Xray in mid to late April come to the first floor of radiology  Stop Megace  Make sure Physical therapy is arranged Take Lisinopril 5 mg daily and Lopressor 12.5 mg twice a day    Treatment Goals:  Goals (1 Years of Data) as of 10/19/13         As of Today 10/17/13 10/16/13 10/09/13 10/02/13     Blood Pressure    . Blood Pressure < 140/90  115/80 130/85 128/89 126/88 99/64      Progress Toward Treatment Goals:  Treatment Goal 10/19/2013  Blood pressure at goal  Stop smoking stopped smoking    Self Care Goals & Plans:  Self Care Goal 10/19/2013  Manage my medications take my medicines as prescribed; bring my medications to every visit; refill my medications on time; follow the sick day instructions if I am sick  Monitor my health keep track of my blood pressure  Eat healthy foods eat more vegetables; eat fruit for snacks and desserts; eat baked foods instead of fried foods; eat foods that are low in salt; drink diet soda or water instead of juice or soda  Be physically active find an activity I enjoy  Stop smoking -  Prevent falls -  Meeting treatment goals maintain the current self-care plan    No flowsheet data found.   Care Management & Community Referrals:  Referral 10/19/2013  Referrals made for care management support none needed  Referrals made to community resources none       Hypertension As your heart beats, it forces blood through your arteries. This force is your blood pressure. If the pressure is too high, it is called hypertension (HTN) or high blood pressure. HTN is dangerous because you may have it and not know it. High blood pressure may mean that your heart has to work harder to pump blood. Your arteries may be narrow or stiff. The extra work puts you at risk for heart disease, stroke, and other problems.  Blood pressure consists of two numbers, a  higher number over a lower, 110/72, for example. It is stated as "110 over 72." The ideal is below 120 for the top number (systolic) and under 80 for the bottom (diastolic). Write down your blood pressure today. You should pay close attention to your blood pressure if you have certain conditions such as:  Heart failure.  Prior heart attack.  Diabetes  Chronic kidney disease.  Prior stroke.  Multiple risk factors for heart disease. To see if you have HTN, your blood pressure should be measured while you are seated with your arm held at the level of the heart. It should be measured at least twice. A one-time elevated blood pressure reading (especially in the Emergency Department) does not mean that you need treatment. There may be conditions in which the blood pressure is different between your right and left arms. It is important to see your caregiver soon for a recheck. Most people have essential hypertension which means that there is not a specific cause. This type of high blood pressure may be lowered by changing lifestyle factors such as:  Stress.  Smoking.  Lack of exercise.  Excessive weight.  Drug/tobacco/alcohol use.  Eating less salt. Most people do not have symptoms from high blood pressure until it has caused damage to the body. Effective treatment can often prevent, delay or reduce  that damage. TREATMENT  When a cause has been identified, treatment for high blood pressure is directed at the cause. There are a large number of medications to treat HTN. These fall into several categories, and your caregiver will help you select the medicines that are best for you. Medications may have side effects. You should review side effects with your caregiver. If your blood pressure stays high after you have made lifestyle changes or started on medicines,   Your medication(s) may need to be changed.  Other problems may need to be addressed.  Be certain you understand your  prescriptions, and know how and when to take your medicine.  Be sure to follow up with your caregiver within the time frame advised (usually within two weeks) to have your blood pressure rechecked and to review your medications.  If you are taking more than one medicine to lower your blood pressure, make sure you know how and at what times they should be taken. Taking two medicines at the same time can result in blood pressure that is too low. SEEK IMMEDIATE MEDICAL CARE IF:  You develop a severe headache, blurred or changing vision, or confusion.  You have unusual weakness or numbness, or a faint feeling.  You have severe chest or abdominal pain, vomiting, or breathing problems. MAKE SURE YOU:   Understand these instructions.  Will watch your condition.  Will get help right away if you are not doing well or get worse. Document Released: 07/06/2005 Document Revised: 09/28/2011 Document Reviewed: 02/24/2008 [facility] Patient Information 2014 [place].  Nonspecific Tachycardia Tachycardia is a faster than normal heartbeat (more than 100 beats per minute). In adults, the heart normally beats between 60 and 100 times a minute. A fast heartbeat may be a normal response to exercise or stress. It does not necessarily mean that something is wrong. However, sometimes when your heart beats too fast it may not be able to pump enough blood to the rest of your body. This can result in chest pain, shortness of breath, dizziness, and even fainting. Nonspecific tachycardia means that the specific cause or pattern of your tachycardia is unknown. CAUSES  Tachycardia may be harmless or it may be due to a more serious underlying cause. Possible causes of tachycardia include:  Exercise or exertion.  Fever.  Pain or injury.  Infection.  Loss of body fluids (dehydration).  Overactive thyroid.  Lack of red blood cells (anemia).  Anxiety and stress.  Alcohol.  Caffeine.  Tobacco  products.  Diet pills.  Illegal drugs.  Heart disease. SYMPTOMS  Rapid or irregular heartbeat (palpitations).  Suddenly feeling your heart beating (cardiac awareness).  Dizziness.  Tiredness (fatigue).  Shortness of breath.  Chest pain.  Nausea.  Fainting. DIAGNOSIS  Your caregiver will perform a physical exam and take your medical history. In some cases, a heart specialist (cardiologist) may be consulted. Your caregiver may also order:  Blood tests.  Electrocardiography. This test records the electrical activity of your heart.  A heart monitoring test. TREATMENT  Treatment will depend on the likely cause of your tachycardia. The goal is to treat the underlying cause of your tachycardia. Treatment methods may include:  Replacement of fluids or blood through an intravenous (IV) tube for moderate to severe dehydration or anemia.  New medicines or changes in your current medicines.  Diet and lifestyle changes.  Treatment for certain infections.  Stress relief or relaxation methods. HOME CARE INSTRUCTIONS   Rest.  Drink enough fluids to keep your  urine clear or pale yellow.  Do not smoke.  Avoid:  Caffeine.  Tobacco.  Alcohol.  Chocolate.  Stimulants such as over-the-counter diet pills or pills that help you stay awake.  Situations that cause anxiety or stress.  Illegal drugs such as marijuana, phencyclidine (PCP), and cocaine.  Only take medicine as directed by your caregiver.  Keep all follow-up appointments as directed by your caregiver. SEEK IMMEDIATE MEDICAL CARE IF:   You have pain in your chest, upper arms, jaw, or neck.  You become weak, dizzy, or feel faint.  You have palpitations that will not go away.  You vomit, have diarrhea, or pass blood in your stool.  Your skin is cool, pale, and wet.  You have a fever that will not go away with rest, fluids, and medicine. MAKE SURE YOU:   Understand these instructions.  Will watch  your condition.  Will get help right away if you are not doing well or get worse. Document Released: 08/13/2004 Document Revised: 09/28/2011 Document Reviewed: 06/16/2011 [facility] Patient Information 2014 [place], [place].  Type 2 Diabetes Mellitus, Adult Type 2 diabetes mellitus is a long-term (chronic) disease. In type 2 diabetes:  The pancreas does not make enough of a hormone called insulin.  The cells in the body do not respond as well to the insulin that is made.  Both of the above can happen. Normally, insulin moves sugars from food into tissue cells. This gives you energy. If you have type 2 diabetes, sugars cannot be moved into tissue cells. This causes high blood sugar (hyperglycemia).  HOME CARE  Have your hemoglobin A1c level checked twice a year. The level shows if your diabetes is under control or out of control.  Perform daily blood sugar testing as told by your doctor.  Check your ketone levels by testing your pee (urine) when you are sick and as told.  Take your diabetes or insulin medicine as told by your doctor.  Never run out of insulin.  Adjust how much insulin you give yourself based on how many carbs (carbohydrates) you eat. Carbs are in many foods, such as fruits, vegetables, whole grains, and dairy products.  Have a healthy snack between every healthy meal. Have 3 meals and 3 snacks a day.  Lose weight if you are overweight.  Carry a medical alert card or wear your medical alert jewelry.  Carry a 15 gram carb snack with you at all times. Examples include:  Glucose pills, 3 or 4.  Glucose gel, 15 gram tube.  Raisins, 2 tablespoons (24 grams).  Jelly beans, 6.  Animal crackers, 8.  Sugar pop, 4 ounces (120 milliliters).  Gummy treats, 9.  Notice low blood sugar (hypoglycemia) symptoms, such as:  Shaking (tremors).  Decreased ability to think clearly.  Sweating.  Increased heart rate.  Headache.  Dry mouth.  Hunger.  Crabbiness  (irritability).  Being worried or tense (anxiety).  Restless sleep.  A change in speech or coordination.  Confusion.  Treat low blood sugar right away. If you are alert and can swallow, follow the 15:15 rule:  Take 15 20 grams of a rapid-acting glucose or carb. This includes glucose gel, glucose pills, or 4 ounces (120 milliliters) of fruit juice, regular pop, or low-fat milk.  Check your blood sugar level after taking the glucose.  Take 15 20 grams of more glucose if the repeat blood sugar level is still 70 mg/dL (milligrams/deciliter) or below.  Eat a meal or snack within 1 hour of  the blood sugar levels going back to normal.  Notice early symptoms of high blood sugar, such as:  Being really thirsty or drinking a lot (polydipsia).  Peeing (urinating) a lot (polyuria).  Do at least 150 minutes of physical activity a week or as told.  Split the 150 minutes of activity up during the week. Do not do 150 minutes of activity in one day.  Perform exercises, such as weight lifting, at least 2 times a week or as told.  Adjust your insulin or food intake as needed if you start a new exercise or sport.  Follow your sick day plan when you are not able to eat or drink as usual.  Avoid tobacco use.  Women who are not pregnant should drink no more than 1 drink a day. Men should drink no more than 2 drinks a day.  Only drink alcohol with food.  Ask your doctor if alcohol is safe for you.  Tell your doctor if you drink alcohol several times during the week.  See your doctor regularly.  Schedule an eye exam soon after you are diagnosed with diabetes. Schedule exams once every year.  Check your skin and feet every day. Check for cuts, bruises, redness, nail problems, bleeding, blisters, or sores. A doctor should do a foot exam once a year.  Brush your teeth and gums twice a day. Floss once a day. Visit your dentist regularly.  Share your diabetes plan with your workplace or  school.  Stay up-to-date with shots that fight against diseases (immunizations).  Learn how to manage stress.  Get diabetes education and support as needed.  Ask your doctor for special help if:  You need help to maintain or improve how you to do things on your own.  You need help to maintain or improve the quality of your life.  You have foot or hand problems.  You have trouble cleaning yourself, dressing, eating, or doing physical activity. GET HELP RIGHT AWAY IF:  You have trouble breathing.  You have moderate to large ketone levels.  You are unable to eat food or drink fluids for more than 6 hours.  You feel sick to your stomach (nauseous) or throw up (vomit) for more than 6 hours.  Your blood sugar level is over 240 mg/dL.  There is a change in mental status.  You get another serious illness.  You have watery poop (diarrhea) for more than 6 hours.  You have been sick or have had a fever for 2 or more days and are not getting better.  You have pain when you are physically active. MAKE SURE YOU:  Understand these instructions.  Will watch your condition.  Will get help right away if you are not doing well or get worse. Document Released: 04/14/2008 Document Revised: 04/26/2013 Document Reviewed: 11/04/2012 [name] Patient Information 2014 [place], [place].  Diabetes and Exercise Exercising regularly is important. It is not just about losing weight. It has many health benefits, such as:  Improving your overall fitness, flexibility, and endurance.  Increasing your bone density.  Helping with weight control.  Decreasing your body fat.  Increasing your muscle strength.  Reducing stress and tension.  Improving your overall health. People with diabetes who exercise gain additional benefits because exercise:  Reduces appetite.  Improves the body's use of blood sugar (glucose).  Helps lower or control blood glucose.  Decreases blood  pressure.  Helps control blood lipids (such as cholesterol and triglycerides).  Improves the body's use of  the hormone insulin by:  Increasing the body's insulin sensitivity.  Reducing the body's insulin needs.  Decreases the risk for heart disease because exercising:  Lowers cholesterol and triglycerides levels.  Increases the levels of good cholesterol (such as high-density lipoproteins [HDL]) in the body.  Lowers blood glucose levels. YOUR ACTIVITY PLAN  Choose an activity that you enjoy and set realistic goals. Your health care provider or diabetes educator can help you make an activity plan that works for you. You can break activities into 2 or 3 sessions throughout the day. Doing so is as good as one long session. Exercise ideas include:  Taking the dog for a walk.  Taking the stairs instead of the elevator.  Dancing to your favorite song.  Doing your favorite exercise with a friend. RECOMMENDATIONS FOR EXERCISING WITH TYPE 1 OR TYPE 2 DIABETES   Check your blood glucose before exercising. If blood glucose levels are greater than 240 mg/dL, check for urine ketones. Do not exercise if ketones are present.  Avoid injecting insulin into areas of the body that are going to be exercised. For example, avoid injecting insulin into:  The arms when playing tennis.  The legs when jogging.  Keep a record of:  Food intake before and after you exercise.  Expected peak times of insulin action.  Blood glucose levels before and after you exercise.  The type and amount of exercise you have done.  Review your records with your health care provider. Your health care provider will help you to develop guidelines for adjusting food intake and insulin amounts before and after exercising.  If you take insulin or oral hypoglycemic agents, watch for signs and symptoms of hypoglycemia. They include:  Dizziness.  Shaking.  Sweating.  Chills.  Confusion.  Drink plenty of water  while you exercise to prevent dehydration or heat stroke. Body water is lost during exercise and must be replaced.  Talk to your health care provider before starting an exercise program to make sure it is safe for you. Remember, almost any type of activity is better than none. Document Released: 09/26/2003 Document Revised: 03/08/2013 Document Reviewed: 12/13/2012 [facility] Patient Information 2014 [place].  Metformin tablets What is this medicine? METFORMIN (met FOR min) is used to treat type 2 diabetes. It helps to control blood sugar. Treatment is combined with diet and exercise. This medicine can be used alone or with other medicines for diabetes. This medicine may be used for other purposes; ask your health care provider or pharmacist if you have questions. COMMON BRAND NAME(S): Glucophage What should I tell my health care provider before I take this medicine? They need to know if you have any of these conditions: -anemia -frequently drink alcohol-containing beverages -become easily dehydrated -heart attack -heart failure that is treated with medications -kidney disease -liver disease -polycystic ovary syndrome -serious infection or injury -vomiting -an unusual or allergic reaction to metformin, other medicines, foods, dyes, or preservatives -pregnant or trying to get pregnant -breast-feeding How should I use this medicine? Take this medicine by mouth. Take it with meals. Swallow the tablets with a drink of water. Follow the directions on the prescription label. Take your medicine at regular intervals. Do not take your medicine more often than directed. Talk to your pediatrician regarding the use of this medicine in children. While this drug may be prescribed for children as young as 22 years of age for selected conditions, precautions do apply. Overdosage: If you think you have taken too much of  this medicine contact a poison control center or emergency room at once. NOTE:  This medicine is only for you. Do not share this medicine with others. What if I miss a dose? If you miss a dose, take it as soon as you can. If it is almost time for your next dose, take only that dose. Do not take double or extra doses. What may interact with this medicine? Do not take this medicine with any of the following medications: -dofetilide -gatifloxacin -certain contrast medicines given before X-rays, CT scans, MRI, or other procedures This medicine may also interact with the following medications: -digoxin -diuretics -male hormones, like estrogens or progestins and birth control pills -isoniazid -medicines for blood pressure, heart disease, irregular heart beat -morphine -nicotinic acid -phenothiazines like chlorpromazine, mesoridazine, prochlorperazine, thioridazine -phenytoin -procainamide -quinidine -quinine -ranitidine -steroid medicines like prednisone or cortisone -stimulant medicines for attention disorders, weight loss, or to stay awake -thyroid medicines -trimethoprim -vancomycin This list may not describe all possible interactions. Give your health care provider a list of all the medicines, herbs, non-prescription drugs, or dietary supplements you use. Also tell them if you smoke, drink alcohol, or use illegal drugs. Some items may interact with your medicine. What should I watch for while using this medicine? Visit your doctor or health care professional for regular checks on your progress. A test called the HbA1C (A1C) will be monitored. This is a simple blood test. It measures your blood sugar control over the last 2 to 3 months. You will receive this test every 3 to 6 months. Learn how to check your blood sugar. Learn the symptoms of low and high blood sugar and how to manage them. Always carry a quick-source of sugar with you in case you have symptoms of low blood sugar. Examples include hard sugar candy or glucose tablets. Make sure others know that you  can choke if you eat or drink when you develop serious symptoms of low blood sugar, such as seizures or unconsciousness. They must get medical help at once. Tell your doctor or health care professional if you have high blood sugar. You might need to change the dose of your medicine. If you are sick or exercising more than usual, you might need to change the dose of your medicine. Do not skip meals. Ask your doctor or health care professional if you should avoid alcohol. Many nonprescription cough and cold products contain sugar or alcohol. These can affect blood sugar. This medicine may cause ovulation in premenopausal women who do not have regular monthly periods. This may increase your chances of becoming pregnant. You should not take this medicine if you become pregnant or think you may be pregnant. Talk with your doctor or health care professional about your birth control options while taking this medicine. Contact your doctor or health care professional right away if think you are pregnant. If you are going to need surgery, a MRI, CT scan, or other procedure, tell your doctor that you are taking this medicine. You may need to stop taking this medicine before the procedure. Wear a medical ID bracelet or chain, and carry a card that describes your disease and details of your medicine and dosage times. What side effects may I notice from receiving this medicine? Side effects that you should report to your doctor or health care professional as soon as possible: -allergic reactions like skin rash, itching or hives, swelling of the face, lips, or tongue -breathing problems -feeling faint or lightheaded, falls -muscle aches  or pains -signs and symptoms of low blood sugar such as feeling anxious, confusion, dizziness, increased hunger, unusually weak or tired, sweating, shakiness, cold, irritable, headache, blurred vision, fast heartbeat, loss of consciousness -slow or irregular heartbeat -unusual stomach  pain or discomfort -unusually tired or weak Side effects that usually do not require medical attention (report to your doctor or health care professional if they continue or are bothersome): -diarrhea -headache -heartburn -metallic taste in mouth -nausea -stomach gas, upset This list may not describe all possible side effects. Call your doctor for medical advice about side effects. You may report side effects to FDA at 1-800-FDA-1088. Where should I keep my medicine? Keep out of the reach of children. Store at room temperature between 15 and 30 degrees C (59 and 86 degrees F). Protect from moisture and light. Throw away any unused medicine after the expiration date. NOTE: This sheet is a summary. It may not cover all possible information. If you have questions about this medicine, talk to your doctor, pharmacist, or health care provider.  2014, [employer]. (2012-10-18 16:03:44)

## 2013-10-19 NOTE — Assessment & Plan Note (Signed)
  Assessment: Progress toward smoking cessation:  stopped smoking Barriers to progress toward smoking cessation:  none Comments: quit 09/30/13   Plan: Instruction/counseling given:  I commended patient for quitting and reviewed strategies for preventing relapses. Educational resources provided: none Self management tools provided:   (none) Medications to assist with smoking cessation:  None Patient agreed to the following self-care plans for smoking cessation: n/a Other plans: will keep assessing

## 2013-10-19 NOTE — Progress Notes (Signed)
   Subjective:    Patient ID: Jacob Joseph, male    DOB: [birth year], 57 y.o.   MRN: [id number]  HPI Comments: 58 y.o Past Medical History Hypertension (BP 115/80), controlled HIV, Depression, Stroke, TB lung (treated), Hepatitis C, COPD (recently completed 5 days Pred. 50 mg), recent steroid induced hyperglycemia, history of CAP recently, history of renal failure, history of DVT on Coumadin (INR 10/09/13 2.60), history of hyperglycemia.       1) He recently was tx'ed for CAP and then COPD with Prednisone x 5 days and finished steroids on Monday. He denies sob.              2) He has been having hyperglycemia thought 2/2 steroids.  His cbg today was 219 and HA1C 7.5.  He states he had coffee with splenda today.      3) Chronic back pain due to herniated disk; pain is 9-10/10 constant, aching.  He tried Tramadol and did not work.  He is trying to get in touch with the pain clinic to schedule an appt but has  Not as of yet.   4) He reports dizziness last night when trying to stand 5) HR is 112 then 110 then 100 today.  He had a cup of coffee with Splenda this am.  He states he does have anxiety. tsh normal 09/28/13.   6) He states he has abnormal appearance of skin around his neck on both sides noticed w/in the 1-2 months.     7)He reports weakness since his stroke previously and states Dr. Megan Salon is trying to get him physical therapy.  He normally just lies around the house in bed     SH: lives with wife of 15 years.  Quit smoking 09/30/13                           Review of Systems  Constitutional: Negative for fever, chills and appetite change.  Respiratory: Negative for shortness of breath.   Gastrointestinal: Negative for constipation.  Genitourinary: Negative for dysuria.  Neurological: Positive for dizziness.       Objective:   Physical Exam  Nursing note and vitals reviewed. Constitutional: He is oriented to person, place, and time. He appears well-developed and well-nourished.  He is cooperative.  HENT:  Head: Normocephalic and atraumatic.  Mouth/Throat: Oropharynx is clear and moist and mucous membranes are normal. Abnormal dentition. No oropharyngeal exudate.  Eyes: Conjunctivae are normal. Pupils are equal, round, and reactive to light. Right eye exhibits no discharge. Left eye exhibits no discharge. No scleral icterus.  Neck:    No ttp of neck tissue   Cardiovascular: Regular rhythm, S1 normal, S2 normal and normal heart sounds.  Tachycardia present.   No murmur heard. No lower ext edema   Pulmonary/Chest: Effort normal and breath sounds normal.  Abdominal: Soft. Bowel sounds are normal. He exhibits no distension. There is no tenderness.  Obese ab, firm  Musculoskeletal: He exhibits no edema.  Neurological: He is alert and oriented to person, place, and time.  In wheelchair today  Generalized weakness   Skin: Skin is warm, dry and intact. No rash noted.  Psychiatric: He has a normal mood and affect. His speech is normal and behavior is normal. Judgment and thought content normal. Cognition and memory are normal.          Assessment & Plan:

## 2013-10-19 NOTE — Assessment & Plan Note (Addendum)
HR 112>110>100.  Noted on EKG today rate 100 ST, LAD, nl intervals, no ST/T changes, no LVH Will refer to cardiology for holter monitor  Etiology could by physical deconditioning, COPD, anxiety, tsh normal, he also drank coffee this am (1 cup), echo 2013 normal  Checked orthostatic vital signs today negative  Will start Lopressor 12.5 mg bid

## 2013-10-19 NOTE — Assessment & Plan Note (Signed)
Pending PT to be arranged for generalized weakness and deconditioning

## 2013-10-19 NOTE — Assessment & Plan Note (Signed)
Concerned for this around his neck which could be 2/2 HIV

## 2013-10-19 NOTE — Progress Notes (Signed)
Case discussed with Dr. Michail Sermon at the time of the visit.  We reviewed the resident's history and exam and pertinent patient test results.  I agree with the assessment, diagnosis and plan of care documented in the resident's note.  Jacob Joseph may have been an undiagnosed diabetic and this diagnosis may have been unmasked by the high dose prednisone.  Now that the prednisone course has been completed I suspect his blood sugars will decrease.  That being said, he still may require pharmacologic intervention and we would prefer oral therapy if his repeat blood sugars and polyuria/polydipisia have improved off of the prednisone.  Upon return, will consider the initiation of metformin if this is how this clinically plays out.

## 2013-10-19 NOTE — Assessment & Plan Note (Signed)
Pt trying to make appt with pain clinic

## 2013-10-19 NOTE — Assessment & Plan Note (Signed)
Respiratory status improved  Will repeat CXR in 2 weeks

## 2013-10-19 NOTE — Assessment & Plan Note (Signed)
New diagnosis today HA1C 7.5 Started Metformin 500 mg bid  Given info Will f/u in 1 month then 3 months for [facility] check

## 2013-10-19 NOTE — Assessment & Plan Note (Signed)
BP Readings from Last 3 Encounters:  10/19/13 117/83  10/17/13 130/85  10/16/13 128/89    Lab Results  Component Value Date   NA 132* 10/17/2013   K 4.5 10/17/2013   CREATININE 0.94 10/17/2013    Assessment: Blood pressure control: controlled Progress toward BP goal:  at goal Comments: none  Plan: Medications:  continue current medications though decreased Lisinopril to 5 mg daily and added Lopressor 12.5 mg bid for ST Educational resources provided: brochure;handout Self management tools provided: other (see comments) Other plans: f/u in 1-2 months

## 2013-10-23 ENCOUNTER — Other Ambulatory Visit: Payer: Self-pay | Admitting: Internal Medicine

## 2013-10-23 ENCOUNTER — Ambulatory Visit: Payer: Medicaid Other

## 2013-10-23 ENCOUNTER — Ambulatory Visit (INDEPENDENT_AMBULATORY_CARE_PROVIDER_SITE_OTHER): Payer: Medicaid Other | Admitting: Pharmacist

## 2013-10-23 ENCOUNTER — Telehealth: Payer: Self-pay | Admitting: *Deleted

## 2013-10-23 DIAGNOSIS — E119 Type 2 diabetes mellitus without complications: Secondary | ICD-10-CM

## 2013-10-23 DIAGNOSIS — Z7901 Long term (current) use of anticoagulants: Secondary | ICD-10-CM

## 2013-10-23 DIAGNOSIS — Z8673 Personal history of transient ischemic attack (TIA), and cerebral infarction without residual deficits: Secondary | ICD-10-CM

## 2013-10-23 DIAGNOSIS — I82409 Acute embolism and thrombosis of unspecified deep veins of unspecified lower extremity: Secondary | ICD-10-CM

## 2013-10-23 LAB — POCT INR: INR: 3

## 2013-10-23 MED ORDER — GLIPIZIDE 5 MG PO TABS: 5.0000 mg | ORAL_TABLET | Freq: Every day | ORAL | Status: DC

## 2013-10-23 NOTE — Telephone Encounter (Signed)
Pt presented to triage to c/o metformin causing severe diarrhea, he took it twice and both times he states it was horrible. He stopped it Saturday and today states he feels better, no more diarrhea. Wants to know if you prescribe something else, ph #[phone]. Please advise

## 2013-10-23 NOTE — Progress Notes (Signed)
Anti-Coagulation Progress Note  Jacob Joseph is a 58 y.o. male who is currently on an anti-coagulation regimen.    RECENT RESULTS: Recent results are below, the most recent result is correlated with a dose of 17.5 mg. per week: Lab Results  Component Value Date   INR 3.00 10/23/2013   INR 2.60 10/09/2013   INR 2.45* 10/02/2013    ANTI-COAG DOSE: Anticoagulation Dose Instructions as of 10/23/2013     [name] Tue Wed Thu Fri Sat   New Dose 2.5 mg 2.5 mg 2.5 mg 2.5 mg 2.5 mg 2.5 mg 2.5 mg       ANTICOAG SUMMARY: Anticoagulation Episode Summary   Current INR goal 2.0-3.0  Next INR check 11/06/2013  INR from last check 3.00 (10/23/2013)  Weekly max dose   Target end date Indefinite  [facility]  Preferred lab   Send INR reminders to    Indications  History of CVA [V12.54] Warfarin anticoagulation [V58.61]        Comments         ANTICOAG TODAY: Anticoagulation Summary as of 10/23/2013   INR goal 2.0-3.0  Selected INR 3.00 (10/23/2013)  Next INR check 11/06/2013  Target end date Indefinite   Indications  History of CVA [V12.54] Warfarin anticoagulation [V58.61]      Anticoagulation Episode Summary   [facility]   Preferred lab    Send INR reminders to    Comments       PATIENT INSTRUCTIONS: Patient Instructions  Patient instructed to take medications as defined in the Anti-coagulation Track section of this encounter.  Patient instructed to take today's dose.  Patient verbalized understanding of these instructions.       FOLLOW-UP Return in 2 weeks (on 11/06/2013) for Follow up INR at 2:45PM.  [name], III Pharm.D., CACP

## 2013-10-23 NOTE — Telephone Encounter (Signed)
I have called in Glipizide 5 mg daily. Please call patient to pick medication. Ask patient to keep appt with Dr Hayes Ludwig on 4/23.

## 2013-10-23 NOTE — Patient Instructions (Signed)
Patient instructed to take medications as defined in the Anti-coagulation Track section of this encounter.  Patient instructed to take today's dose.  Patient verbalized understanding of these instructions.    

## 2013-10-23 NOTE — Assessment & Plan Note (Signed)
Patient called clinic complaining of severe diarrhea after taking Metformin. Diarrhea resolved when he stopped Metformin on his own for 2 days. Requested something for his diabetes. A1C 4 on 10/19/2013 7.5%.   Plan  - d/c metformin due to intolerance - start Glipizide 5 mg once daily  - patient has a follow up appt with Dr Hayes Ludwig on 4/23. Diabetes treatment with further be discussed

## 2013-10-23 NOTE — Progress Notes (Signed)
INTERNAL MEDICINE TEACHING ATTENDING ADDENDUM - Aldine Contes M.D  Duration- indefinite, Indication- DVT, CVA, INR- therapeutic. Agree with Dr. Gladstone Pih recommendations as outlined in his note. Pt to f/u in 2 weeks for repeat INR

## 2013-10-24 NOTE — Addendum Note (Signed)
Addended by: Cresenciano Genre on: 10/24/2013 04:00 PM   Modules accepted: Orders

## 2013-10-24 NOTE — Telephone Encounter (Signed)
Called pt and he is agreeable

## 2013-10-25 NOTE — Progress Notes (Signed)
I saw and evaluated the patient.  I personally confirmed the key portions of the history and exam documented by Dr. McLean and I reviewed pertinent patient test results.  The assessment, diagnosis, and plan were formulated together and I agree with the documentation in the resident's note.    

## 2013-10-27 NOTE — Addendum Note (Signed)
Addended by: MCLEAN, Rickard Kennerly N on: 10/27/2013 01:04 PM   Modules accepted: Level of Service  

## 2013-10-27 NOTE — Addendum Note (Signed)
Addended by: Cresenciano Genre on: 10/27/2013 01:01 PM   Modules accepted: Level of Service

## 2013-10-31 ENCOUNTER — Other Ambulatory Visit (HOSPITAL_COMMUNITY): Payer: Self-pay | Admitting: Internal Medicine

## 2013-11-06 ENCOUNTER — Ambulatory Visit (INDEPENDENT_AMBULATORY_CARE_PROVIDER_SITE_OTHER): Payer: Medicaid Other | Admitting: Pharmacist

## 2013-11-06 DIAGNOSIS — Z7901 Long term (current) use of anticoagulants: Secondary | ICD-10-CM

## 2013-11-06 DIAGNOSIS — Z8673 Personal history of transient ischemic attack (TIA), and cerebral infarction without residual deficits: Secondary | ICD-10-CM

## 2013-11-06 DIAGNOSIS — I82409 Acute embolism and thrombosis of unspecified deep veins of unspecified lower extremity: Secondary | ICD-10-CM

## 2013-11-06 LAB — POCT INR: INR: 3.8

## 2013-11-06 NOTE — Patient Instructions (Signed)
Patient instructed to take medications as defined in the Anti-coagulation Track section of this encounter.  Patient instructed to OMIT today's dose and all subsequent Monday doses.  Patient verbalized understanding of these instructions.  Patient sees his PCP here in [facility] on Friday 24-APR-15. Advised to discuss his observation of dsypnea with the PCP. Advised if it were to worsen, have hemoptysis, chest pain, nausea/vomiting--to call EMS and go to the ED.

## 2013-11-06 NOTE — Progress Notes (Signed)
Anti-Coagulation Progress Note  Jacob Joseph is a 58 y.o. male who is currently on an anti-coagulation regimen.    RECENT RESULTS: Recent results are below, the most recent result is correlated with a dose of 17.5 mg. per week:  Patient was advised during his scheduled office visit on this Friday 24-APR-15 to discuss duration of warfarin therapy after this index event (now treated for 7 months).  Lab Results  Component Value Date   INR 3.80 11/06/2013   INR 3.00 10/23/2013   INR 2.60 10/09/2013    ANTI-COAG DOSE: Anticoagulation Dose Instructions as of 11/06/2013     [name] Tue Wed Thu Fri Sat   New Dose 2.5 mg 0 mg 2.5 mg 2.5 mg 2.5 mg 2.5 mg 2.5 mg       ANTICOAG SUMMARY: Anticoagulation Episode Summary   Current INR goal 2.0-3.0  Next INR check 11/10/2013  INR from last check 3.80! (11/06/2013)  Weekly max dose   Target end date Indefinite  [facility]  Preferred lab   Send INR reminders to    Indications  History of CVA [V12.54] Warfarin anticoagulation [V58.61]        Comments         ANTICOAG TODAY: Anticoagulation Summary as of 11/06/2013   INR goal 2.0-3.0  Selected INR 3.80! (11/06/2013)  Next INR check 11/10/2013  Target end date Indefinite   Indications  History of CVA [V12.54] Warfarin anticoagulation [V58.61]      Anticoagulation Episode Summary   [facility]   Preferred lab    Send INR reminders to    Comments       PATIENT INSTRUCTIONS: Patient Instructions  Patient instructed to take medications as defined in the Anti-coagulation Track section of this encounter.  Patient instructed to OMIT today's dose and all subsequent Monday doses.  Patient verbalized understanding of these instructions.  Patient sees his PCP here in [facility] on Friday 24-APR-15. Advised to discuss his observation of dsypnea with the PCP. Advised if it were to worsen, have hemoptysis, chest pain, nausea/vomiting--to call EMS and go to  the ED.     FOLLOW-UP Return in 4 days (on 11/10/2013) for Follow up INR while seeing his PCP .  [name], III Pharm.D., CACP

## 2013-11-07 ENCOUNTER — Other Ambulatory Visit: Payer: Self-pay | Admitting: Internal Medicine

## 2013-11-07 NOTE — Progress Notes (Signed)
Indication: Venous thromboembolism.  Duration: 6 months, completed March 2015.  INR: Above target.  Agree with Dr. Gladstone Pih assessment and plan.  Will ask that the risks and benefits of continued anticoagulation be discussed with the patient at his appointment on 11/09/2013 so that he may make an informed decision concerning anticoagulation therapy.

## 2013-11-09 ENCOUNTER — Encounter: Payer: Self-pay | Admitting: Internal Medicine

## 2013-11-09 ENCOUNTER — Ambulatory Visit: Payer: Medicaid Other | Admitting: Internal Medicine

## 2013-11-18 ENCOUNTER — Emergency Department (HOSPITAL_COMMUNITY): Payer: Medicaid Other

## 2013-11-18 ENCOUNTER — Emergency Department (INDEPENDENT_AMBULATORY_CARE_PROVIDER_SITE_OTHER)
Admission: EM | Admit: 2013-11-18 | Discharge: 2013-11-18 | Disposition: A | Discharge status: Nursing Home (Includes ICF) | Payer: Medicaid Other | Source: Home / Self Care

## 2013-11-18 ENCOUNTER — Encounter (HOSPITAL_COMMUNITY): Payer: Self-pay | Admitting: Emergency Medicine

## 2013-11-18 ENCOUNTER — Emergency Department (HOSPITAL_COMMUNITY)
Admission: EM | Admit: 2013-11-18 | Discharge: 2013-11-19 | Disposition: A | Discharge status: Home / Self Care | Payer: Medicaid Other | Attending: Emergency Medicine | Admitting: Emergency Medicine

## 2013-11-18 DIAGNOSIS — M79606 Pain in leg, unspecified: Secondary | ICD-10-CM

## 2013-11-18 DIAGNOSIS — R079 Chest pain, unspecified: Secondary | ICD-10-CM

## 2013-11-18 DIAGNOSIS — Z8611 Personal history of tuberculosis: Secondary | ICD-10-CM | POA: Insufficient documentation

## 2013-11-18 DIAGNOSIS — M7989 Other specified soft tissue disorders: Secondary | ICD-10-CM

## 2013-11-18 DIAGNOSIS — J4489 Other specified chronic obstructive pulmonary disease: Secondary | ICD-10-CM | POA: Insufficient documentation

## 2013-11-18 DIAGNOSIS — Z79899 Other long term (current) drug therapy: Secondary | ICD-10-CM | POA: Insufficient documentation

## 2013-11-18 DIAGNOSIS — J449 Chronic obstructive pulmonary disease, unspecified: Secondary | ICD-10-CM | POA: Insufficient documentation

## 2013-11-18 DIAGNOSIS — Z7901 Long term (current) use of anticoagulants: Secondary | ICD-10-CM | POA: Insufficient documentation

## 2013-11-18 DIAGNOSIS — M79609 Pain in unspecified limb: Secondary | ICD-10-CM

## 2013-11-18 DIAGNOSIS — IMO0002 Reserved for concepts with insufficient information to code with codable children: Secondary | ICD-10-CM | POA: Insufficient documentation

## 2013-11-18 DIAGNOSIS — F3289 Other specified depressive episodes: Secondary | ICD-10-CM | POA: Insufficient documentation

## 2013-11-18 DIAGNOSIS — Z87891 Personal history of nicotine dependence: Secondary | ICD-10-CM | POA: Insufficient documentation

## 2013-11-18 DIAGNOSIS — Z8673 Personal history of transient ischemic attack (TIA), and cerebral infarction without residual deficits: Secondary | ICD-10-CM | POA: Insufficient documentation

## 2013-11-18 DIAGNOSIS — I10 Essential (primary) hypertension: Secondary | ICD-10-CM | POA: Insufficient documentation

## 2013-11-18 DIAGNOSIS — F329 Major depressive disorder, single episode, unspecified: Secondary | ICD-10-CM | POA: Insufficient documentation

## 2013-11-18 DIAGNOSIS — B359 Dermatophytosis, unspecified: Secondary | ICD-10-CM | POA: Insufficient documentation

## 2013-11-18 DIAGNOSIS — Z86718 Personal history of other venous thrombosis and embolism: Secondary | ICD-10-CM | POA: Insufficient documentation

## 2013-11-18 DIAGNOSIS — Z21 Asymptomatic human immunodeficiency virus [HIV] infection status: Secondary | ICD-10-CM | POA: Insufficient documentation

## 2013-11-18 LAB — CBC WITH DIFFERENTIAL/PLATELET
Basophils Absolute: 0 10*3/uL (ref 0.0–0.1)
Basophils Relative: 0 % (ref 0–1)
Eosinophils Absolute: 0.4 10*3/uL (ref 0.0–0.7)
Eosinophils Relative: 5 % (ref 0–5)
HEMATOCRIT: 42.5 % (ref 39.0–52.0)
Hemoglobin: 14.1 g/dL (ref 13.0–17.0)
LYMPHS ABS: 3.1 10*3/uL (ref 0.7–4.0)
LYMPHS PCT: 44 % (ref 12–46)
MCH: 29.7 pg (ref 26.0–34.0)
MCHC: 33.2 g/dL (ref 30.0–36.0)
MCV: 89.7 fL (ref 78.0–100.0)
Monocytes Absolute: 0.7 10*3/uL (ref 0.1–1.0)
Monocytes Relative: 10 % (ref 3–12)
NEUTROS ABS: 2.9 10*3/uL (ref 1.7–7.7)
Neutrophils Relative %: 41 % — ABNORMAL LOW (ref 43–77)
Platelets: 202 10*3/uL (ref 150–400)
RBC: 4.74 MIL/uL (ref 4.22–5.81)
RDW: 13.3 % (ref 11.5–15.5)
WBC: 7 10*3/uL (ref 4.0–10.5)

## 2013-11-18 LAB — I-STAT CHEM 8, ED
BUN: 4 mg/dL — AB (ref 6–23)
CHLORIDE: 111 meq/L (ref 96–112)
CREATININE: 1 mg/dL (ref 0.50–1.35)
Calcium, Ion: 1.2 mmol/L (ref 1.12–1.23)
Glucose, Bld: 87 mg/dL (ref 70–99)
HCT: 46 % (ref 39.0–52.0)
Hemoglobin: 15.6 g/dL (ref 13.0–17.0)
Potassium: 4 mEq/L (ref 3.7–5.3)
SODIUM: 143 meq/L (ref 137–147)
TCO2: 18 mmol/L (ref 0–100)

## 2013-11-18 LAB — PROTIME-INR
INR: 2.45 — AB (ref 0.00–1.49)
PROTHROMBIN TIME: 25.8 s — AB (ref 11.6–15.2)

## 2013-11-18 LAB — I-STAT TROPONIN, ED: TROPONIN I, POC: 0 ng/mL (ref 0.00–0.08)

## 2013-11-18 MED ORDER — IOHEXOL 350 MG/ML SOLN
100.0000 mL | Freq: Once | INTRAVENOUS | Status: AC | PRN
  Administered 2013-11-18: 100 mL via INTRAVENOUS

## 2013-11-18 MED ORDER — SODIUM CHLORIDE 0.9 % IV SOLN: INTRAVENOUS | Status: DC

## 2013-11-18 MED ORDER — ALBUTEROL SULFATE (2.5 MG/3ML) 0.083% IN NEBU
5.0000 mg | INHALATION_SOLUTION | Freq: Once | RESPIRATORY_TRACT | Status: AC
  Administered 2013-11-18: 5 mg via RESPIRATORY_TRACT
  Filled 2013-11-18: qty 6

## 2013-11-18 MED ORDER — MICONAZOLE NITRATE 2 % EX CREA: 1.0000 "application " | TOPICAL_CREAM | Freq: Two times a day (BID) | CUTANEOUS | Status: DC

## 2013-11-18 MED ORDER — OXYCODONE-ACETAMINOPHEN 5-325 MG PO TABS
2.0000 | ORAL_TABLET | Freq: Once | ORAL | Status: AC
  Administered 2013-11-18: 2 via ORAL
  Filled 2013-11-18: qty 2

## 2013-11-18 MED ORDER — IPRATROPIUM BROMIDE 0.02 % IN SOLN
0.5000 mg | Freq: Once | RESPIRATORY_TRACT | Status: AC
  Administered 2013-11-18: 0.5 mg via RESPIRATORY_TRACT
  Filled 2013-11-18: qty 2.5

## 2013-11-18 NOTE — ED Notes (Addendum)
Pt to ED via [facility] with c/o right leg swelling and pain with SOB on exertion. Blacked nails noted right hand with pealing skin. Per [facility], BP-132/72, O2-97% on room air, Temp.-98.3.

## 2013-11-18 NOTE — ED Provider Notes (Signed)
Chief Complaint   No chief complaint on file.   History of Present Illness   Jacob Joseph is a 58 year old male with a history of DVT, HIV, hypertension, COPD, and diabetes. He presents today with a one-week history of pain and swelling in his right leg. He had a DVT in that leg this past August for which he was hospitalized, and has been on warfarin since that. His current dose is 3 mg, 1-1/2 tablets daily every day except for Wednesday. He had a prothrombin time done 12 days ago and it was a little bit elevated at 3.8. He was told to leave off his Wednesday dose. He has pain in the ankle and foot. He denies any pain above the knee. He had some chest pain last night on the left side. This was pleuritic and lasted for hours. It's now gone away he does not have any pain. He denies any shortness of breath, but does look for a dyspneic with minimal exertion. He denies any dizziness or syncope. No fever, chills, abdominal pain, nausea, or vomiting.  Review of Systems   Other than as noted above, the patient denies any of the following symptoms: Systemic:  No fever, chills, weight gain or loss. Respiratory:  No coughing, wheezing, or shortness of breath. Cardiac:  No chest pain, tightness, pressure or syncope. GI:  No abdominal pain, swelling, distension, nausea, or vomiting. GU:  No dysuria, frequency, or hematuria. Ext:  No joint pain or muscle pain.  [place]   Past medical history, family history, social history, meds, and allergies were reviewed.  Current meds include albuterol, Prezista, Stribild, Advair, Glucotrol, lisinopril, Megace, Lopressor, Seroquel, Spiriva, warfarin, mirtazapine, and Ambien.  Physical Examination     Vital signs:  BP 133/72  Pulse 90  Temp(Src) 98.5 F (36.9 C) (Oral)  Resp 16  SpO2 96% Gen:  Alert, oriented, in no distress. Neck:  No tenderness, adenopathy, or JVD. Lungs:  Breath sounds clear and equal bilaterally.  No rales, rhonchi or wheezes. Heart:   Regular rhythm, no gallops or murmers. Abdomen:  Soft, nontender, no organomegaly or mass. Ext:  He has nonpitting edema of his ankle and foot. No palpable cord or distended blood vessels, pedal pulses not felt in either foot, no calf tenderness, Homans sign negative. Neuro:  Alert and oriented times 3.  No muscle weakness.  Sensation intact to light touch. Skin:  Warm and dry.  No rash or skin lesions.  [facility]   IV normal saline was started at 50 mL per hour.  Assessment   The primary encounter diagnosis was Right leg swelling. Diagnoses of Chest pain and History of DVT (deep vein thrombosis) were also pertinent to this visit.  I am suspicious of recurrent DVT and possible pulmonary embolism. He is a high-risk patient, therefore I think in-hospital evaluation would be best.  Plan   The patient was transferred to the ED via [facility] in stable condition.  Medical Decision Making:  58 year old male with history of DVT, HIV, HT, and COPD presents with a 1 week history of right leg pain and swelling, and he had episodes last night of pleuritic left chest pain.  Denies shortness of breath, but he is very dyspneic with minimal exertion.  I am concerned about the possibility of recurrent DVT and PE.  He is a high risk patient and will do best being evaluated in an in hospital setting.        Harden Mo,  MD 11/18/13 1555

## 2013-11-18 NOTE — ED Provider Notes (Addendum)
CSN: [id number]     Arrival date & time 11/18/13  1624 History   First MD Initiated Contact with Patient 11/18/13 1629     Chief Complaint  Patient presents with  . DVT  . Leg Swelling     (Consider location/radiation/quality/duration/timing/severity/associated sxs/prior Treatment) HPI Comments: Pt coming from urgent care with 1 week of right lower ext swelling and pain with prior hx of DVT in the same area.  Pt has been on coumadin since aug and last INR was 1 week ago and 3.8.  He states this is exactly how his leg hurt when he was dx with DVT and those sx had completely resolved until last week.  Also noted 2 hours of throbbing left chest pain worse with deep breathing and better when he laid on his abd from 1-3am this morning.  No further chest pain today and no prior cardiac hx other than HTN.  Pt has hx of COPD and HIV and takes inhalers regularly.  Pt denies productive cough, fever or change in breathing.    The history is provided by the patient and medical records.    Past Medical History  Diagnosis Date  . Hypertension   . HIV (human immunodeficiency virus infection)   . Depression   . Stroke 11/2011    Carotids Doppler negative. Right sided weakness resolved, initially on Plavix for 3 months and then continued with ASA.    . TB lung, latent 1988    Treated  . Calcium oxalate crystals in urine 12/23/2011    Asymptomatic, no hematuria. Advised to take plenty of water.  . Hepatitis C    Past Surgical History  Procedure Laterality Date  . Tee without cardioversion  12/07/2011    Procedure: TRANSESOPHAGEAL ECHOCARDIOGRAM (TEE);  Surgeon: Birdie Riddle, MD;  Location: [facility];  Service: Cardiovascular;  Laterality: N/A;  . Inguinal hernia repair  01/16/2012    Procedure: HERNIA REPAIR INGUINAL INCARCERATED;  Surgeon: Zenovia Jarred, MD;  Location: [facility];  Service: General;  Laterality: Right;  . Hernia repair     Family History  Problem Relation Age of Onset  .  Hypertension Mother   . Hypertension Father   . Cancer Father    History  Substance Use Topics  . Smoking status: Former Smoker -- 0.50 packs/day for 41 years    Types: Cigarettes    Quit date: 04/18/2013  . Smokeless tobacco: Never Used  . Alcohol Use: No    Review of Systems  Skin: Positive for rash.       Rash on the right hand and foot that has been ongoing for months but recently worse.  All other systems reviewed and are negative.     Allergies  Metformin and related and Statins  Home Medications   Prior to Admission medications   Medication Sig Start Date End Date Taking? Authorizing Provider  albuterol (PROVENTIL HFA;VENTOLIN HFA) 108 (90 BASE) MCG/ACT inhaler Inhale 2 puffs into the lungs every 6 (six) hours as needed for wheezing or shortness of breath. 07/05/13   Jessee Avers, MD  Darunavir Ethanolate (PREZISTA) 800 MG tablet Take 1 tablet (800 mg total) by mouth daily with breakfast. 01/03/13   Michel Bickers, MD  elvitegravir-cobicistat-emtricitabine-tenofovir (STRIBILD) 150-150-200-300 MG TABS Take 1 tablet by mouth daily with breakfast. 01/03/13   Michel Bickers, MD  Fluticasone-Salmeterol (ADVAIR DISKUS) 100-50 MCG/DOSE AEPB Inhale 1 puff into the lungs 2 (two) times daily. 08/30/13 08/30/14  Jessee Avers, MD  glipiZIDE (GLUCOTROL) 5 MG  tablet Take 1 tablet (5 mg total) by mouth daily. 10/23/13 10/23/14  Jessee Avers, MD  lisinopril (PRINIVIL,ZESTRIL) 5 MG tablet Take 1 tablet (5 mg total) by mouth daily. 10/19/13 10/19/14  Cresenciano Genre, MD  megestrol (MEGACE ORAL) 40 MG/ML suspension Take 20 mLs (800 mg total) by mouth daily. 07/11/13   Michel Bickers, MD  metoprolol tartrate (LOPRESSOR) 25 MG tablet TAKE 1/2 TABLET BY MOUTH TWICE DAILY 11/07/13   Jessee Avers, MD  mirtazapine (REMERON) 15 MG tablet Take 15 mg by mouth at bedtime.    Historical Provider, MD  Nutritional Supplements (ENSURE HIGH PROTEIN) LIQD Take 1 Can by mouth 2 (two) times daily. 04/18/13   Michel Bickers, MD  QUEtiapine (SEROQUEL) 100 MG tablet Take 150 mg by mouth at bedtime. 03/11/12   Trish Fountain, MD  tiotropium (SPIRIVA HANDIHALER) 18 MCG inhalation capsule Place 1 capsule (18 mcg total) into inhaler and inhale daily. 08/30/13 08/30/14  Jessee Avers, MD  warfarin (COUMADIN) 5 MG tablet Take 2.5 mg by mouth daily.    Historical Provider, MD  zolpidem (AMBIEN) 10 MG tablet Take 10 mg by mouth at bedtime as needed. For sleep    Historical Provider, MD   There were no vitals taken for this visit. Physical Exam  Nursing note and vitals reviewed. Constitutional: He is oriented to person, place, and time. He appears well-developed and well-nourished. No distress.  HENT:  Head: Normocephalic and atraumatic.  Mouth/Throat: Oropharynx is clear and moist.  Eyes: Conjunctivae and EOM are normal. Pupils are equal, round, and reactive to light.  Neck: Normal range of motion. Neck supple.  Cardiovascular: Normal rate, regular rhythm and intact distal pulses.   No murmur heard. Pulmonary/Chest: Effort normal. Not tachypneic. No respiratory distress. He has no decreased breath sounds. He has wheezes. He has rhonchi. He has no rales.  Abdominal: Soft. He exhibits no distension. There is no tenderness. There is no rebound and no guarding.  Musculoskeletal: Normal range of motion. He exhibits tenderness. He exhibits no edema.  Mild 1+ non-pitting edema in the right ankle and foot with tenderness to palpation.  2+ DP pulses and normal flexion/ext.  No calf tenderness or swelling on the right.  No left leg swelling or pain.  Neurological: He is alert and oriented to person, place, and time.  Skin: Skin is warm and dry. Rash noted. No erythema.  Peeling, scaly rash most pronounced on bilateral feet and right hand.  Worse in the web spaces  Psychiatric: He has a normal mood and affect. His behavior is normal.    ED Course  Procedures (including critical care time) Labs Review Labs Reviewed   CBC WITH DIFFERENTIAL - Abnormal; Notable for the following:    Neutrophils Relative % 41 (*)    All other components within normal limits  PROTIME-INR - Abnormal; Notable for the following:    Prothrombin Time 25.8 (*)    INR 2.45 (*)    All other components within normal limits  I-STAT CHEM 8, ED - Abnormal; Notable for the following:    BUN 4 (*)    All other components within normal limits  I-STAT TROPOININ, ED    Imaging Review Dg Chest 2 View  11/18/2013   CLINICAL DATA:  Chest pain and right leg swelling.  EXAM: CHEST  2 VIEW  COMPARISON:  09/28/2013 and prior chest radiographs dating back to 09/04/2003  FINDINGS: The cardiomediastinal silhouette is unremarkable.  Subsegmental atelectasis versus scarring in the lower lungs bilaterally  noted.  There is no evidence of focal airspace disease, pulmonary edema, suspicious pulmonary nodule/mass, pleural effusion, or pneumothorax. No acute bony abnormalities are identified.  IMPRESSION: Mild bibasilar atelectasis/ scarring without other significant abnormality.   Electronically Signed   By: Hassan Rowan M.D.   On: 11/18/2013 17:15   Ct Angio Chest Pe W/cm &/or Wo Cm  11/18/2013   CLINICAL DATA:  Pleuritic chest pain, history of DVT  EXAM: CT ANGIOGRAPHY CHEST WITH CONTRAST  TECHNIQUE: Multidetector CT imaging of the chest was performed using the standard protocol during bolus administration of intravenous contrast. Multiplanar CT image reconstructions and MIPs were obtained to evaluate the vascular anatomy.  CONTRAST:  179mL OMNIPAQUE IOHEXOL 350 MG/ML SOLN  COMPARISON:  Chest radiographs dated 11/18/2013. CT chest 09/04/2003.  FINDINGS: No evidence of pulmonary embolism.  Mild platelike scarring in the lingula and right middle lobe. Mild dependent atelectasis in the bilateral lower lobes.  No suspicious pulmonary nodules. No pleural effusion or pneumothorax.  Visualized thyroid is unremarkable.  The heart is normal in size. Trace pericardial fluid.  Coronary atherosclerosis in the LAD.  Small mediastinal lymph nodes which do not meet pathologic CT size criteria. No suspicious hilar or axillary lymphadenopathy.  Visualized upper abdomen is notable for mild hepatic steatosis headache tiny layering gallstone.  Visualized osseous structures are within normal limits.  Review of the MIP images confirms the above findings.  IMPRESSION: No evidence of pulmonary embolism.  No evidence of acute cardiopulmonary disease.   Electronically Signed   By: Julian Hy M.D.   On: 11/18/2013 22:54     EKG Interpretation   Date/Time:  Saturday Nov 18 2013 16:43:30 EDT Ventricular Rate:  82 PR Interval:  143 QRS Duration: 93 QT Interval:  362 QTC Calculation: 423 R Axis:   -75 Text Interpretation:  Sinus rhythm Left anterior fascicular block No  significant change since last tracing Confirmed by Maryan Rued  MD, Loree Fee  [phone]) on 11/18/2013 4:56:57 PM      MDM   Final diagnoses:  Lower extremity pain  COPD (chronic obstructive pulmonary disease)  Tinea    Patient transferred from urgent care for evaluation of DVT and possible PE. He has a significant history for DVT, HIV, hypertension, COPD and diabetes. He states for the last 1 week he's had worsening right leg pain and swelling where he had his prior DVT. He has been taking Coumadin since August for this and his last INR was 3.8. Last night he had a persistent throbbing left-sided chest pain that lasted approximately 2 hours and was worse with deep breath. On exam today he has bilateral rhonchi and wheezing with a history of COPD. He states he does use inhalers at home with minimal improvement. He denies a productive cough, fever or infectious symptoms.  Also mild swelling and tenderness to the right ankle and foot today on exam without significant calf tenderness. He states this is exactly where it hurts him when he was diagnosed with his DVT. However he says that the pain had completely resolved  until one week ago. He is neurovascularly intact with a good palpable pulse.  Given symptoms and prior history CBC, i-STAT, troponin, INR, chest x-ray, EKG, right lower extremity duplex pending for further evaluation. Also patient given albuterol and Atrovent for wheezing and rhonchi.  6:43 PM Labs without significant findings and pt states last viral load was undetectable and Tcell counts were 450.  DVT study neg for DVT at this time and INR is still  therapuetic.  Feel most likely pt's chest pain is related to his COPD and chest wall pain.  Troponin is neg and due to sx occuring over 12 hours ago would expect elevation is cardiac event however story atypical and unlikely.  As cannot r/o PE and pt on b-blocker which could mask tachycardia will get CTA to r/o.  11:08 PM Ct neg for acute findings.  Pt d/ced home.  Blanchie Dessert, MD 11/18/13 5110  Blanchie Dessert, MD 11/18/13 2312

## 2013-11-18 NOTE — ED Notes (Signed)
Ambulated patient in hall to the restroom. Patient is a little unsteady while walking.

## 2013-11-18 NOTE — ED Notes (Signed)
Pt states Understanding of discharge instructions 

## 2013-11-18 NOTE — Progress Notes (Signed)
VASCULAR LAB PRELIMINARY  PRELIMINARY  PRELIMINARY  PRELIMINARY  Right lower extremity venous Doppler completed.    Preliminary report:  There is no obvious evidence of acute DVT or SVT noted in the right lower extremity.  Iantha Fallen, RVT 11/18/2013, 6:27 PM

## 2013-11-18 NOTE — Discharge Instructions (Signed)
We have determined that your problem requires further evaluation in the emergency department.  We will take care of your transport there.  Once at the emergency department, you will be evaluated by a provider and they will order whatever treatment or tests they deem necessary.  We cannot guarantee that they will do any specific test or do any specific treatment.  ° °

## 2013-11-19 NOTE — ED Notes (Signed)
Pt states Understanding of discharge instructions

## 2013-11-20 ENCOUNTER — Ambulatory Visit: Payer: Medicaid Other | Admitting: Internal Medicine

## 2013-11-20 ENCOUNTER — Encounter: Payer: Self-pay | Admitting: Internal Medicine

## 2013-11-20 ENCOUNTER — Ambulatory Visit (INDEPENDENT_AMBULATORY_CARE_PROVIDER_SITE_OTHER): Payer: Medicaid Other | Admitting: Internal Medicine

## 2013-11-20 VITALS — BP 100/60 | HR 86 | Temp 97.5°F | Ht 72.0 in | Wt 208.0 lb

## 2013-11-20 DIAGNOSIS — I82409 Acute embolism and thrombosis of unspecified deep veins of unspecified lower extremity: Secondary | ICD-10-CM

## 2013-11-20 NOTE — Patient Instructions (Signed)
Take all the medications as recommended. Try to elevate your right leg above the heart level for 30 minutes, 3-4 times daily. If you develop any shortness of breath, chest pain, palpitations, call 911 or seek immediate medical help.

## 2013-11-20 NOTE — Assessment & Plan Note (Addendum)
First time idiopathic unprovoked DVT involving Right Lower extremity proximal as well as distal veins. Initial plan was to do anti-coagulation therapy for 6 months, which would have been 10/12/2013. But as per uptodate recommendations, "indefinite therapy is preferred in patients with a first unprovoked episode of proximal DVT who have a greater concern about recurrent VTE and a relatively lower concern about the burdens of long-term anticoagulant therapy." Discussed with the attending Dr. Marinda Elk regarding further management of anti-coagulation. Discussed with the patient and his wife regarding benefits and risks of continuing the therapy vs discontinuing the therapy. As per uptodate, there is a 10% chance of recurrent DVT in one year and increases by 5 % every additional year. Also the PREVENT study (2 year study after 6 months of anti-coagulation) looking at the role of coumadin vs Placebo in prevention of recurrent idiopathic DVT, coumadin reduced recurrence rates by two thirds without any major bleeding and the ELATE study comparing the INR's 1.5 -1.9 vs 2-3, showed that coumadin with target INR 2-3 had fewer recurrences and major bleedings than coumadin with target INR 1.5 to 1.9.  Plans: After discussing with the patient and his wife, the plan is to continue Coumadin for the next 2 months and have another discussion around 01/12/2014 (9 months from starting coumadin). Recommended patient to bring life style changes such as regular exercises and walking etc. Recommended patient to elevate his legs 3-4 times daily as needed if he notices any swelling or pain in his legs.

## 2013-11-20 NOTE — Progress Notes (Signed)
Subjective:   Patient ID: Jacob Joseph male   DOB: 1956/06/21 58 y.o.   MRN: [id number]  HPI: [name] is a 58 y.o. gentleman with PMH significant for Right lower extremity DVT (september 2014) on coumadin therapy ever since, HIV, Hepatitis C comes to the office for ED follow up.  Patient was seen in the urgent care on 11/18/13 for right leg pain/ swelling, left sided pleuritic chest pain of 1 week duration. Patient was transferred to ED for work up of PE. CT angio was negative for PE along with negative doppler studies. Patients Troponin and all the other blood work were within normal limits. Patient was discharged home and was recommended to follow up with the clinic.   Patient denies any symptoms today. Patient denies any more chest pain, leg pain during this office visit.     Past Medical History  Diagnosis Date  . Hypertension   . HIV (human immunodeficiency virus infection)   . Depression   . Stroke 11/2011    Carotids Doppler negative. Right sided weakness resolved, initially on Plavix for 3 months and then continued with ASA.    . TB lung, latent 1988    Treated  . Calcium oxalate crystals in urine 12/23/2011    Asymptomatic, no hematuria. Advised to take plenty of water.  . Hepatitis C    Current Outpatient Prescriptions  Medication Sig Dispense Refill  . albuterol (PROVENTIL HFA;VENTOLIN HFA) 108 (90 BASE) MCG/ACT inhaler Inhale 2 puffs into the lungs every 6 (six) hours as needed for wheezing or shortness of breath.  1 Inhaler  2  . Darunavir Ethanolate (PREZISTA) 800 MG tablet Take 1 tablet (800 mg total) by mouth daily with breakfast.  30 tablet  11  . elvitegravir-cobicistat-emtricitabine-tenofovir (STRIBILD) 150-150-200-300 MG TABS Take 1 tablet by mouth daily with breakfast.  30 tablet  11  . glipiZIDE (GLUCOTROL) 5 MG tablet Take 1 tablet (5 mg total) by mouth daily.  30 tablet  3  . lisinopril (PRINIVIL,ZESTRIL) 5 MG tablet Take 1 tablet (5 mg total) by mouth  daily.  30 tablet  1  . metoprolol tartrate (LOPRESSOR) 25 MG tablet Take 12.5 mg by mouth 2 (two) times daily.      . miconazole (MICATIN) 2 % cream Apply 1 application topically 2 (two) times daily. Apply to affected areas  28.35 g  0  . QUEtiapine Fumarate (SEROQUEL XR) 150 MG 24 hr tablet Take 150 mg by mouth at bedtime.      [name] Kitchen tiotropium (SPIRIVA HANDIHALER) 18 MCG inhalation capsule Place 1 capsule (18 mcg total) into inhaler and inhale daily.  30 capsule  2  . warfarin (COUMADIN) 5 MG tablet Take 2.5 mg by mouth daily.      [name] Kitchen zolpidem (AMBIEN) 10 MG tablet Take 10 mg by mouth at bedtime as needed. For sleep       No current facility-administered medications for this visit.   Family History  Problem Relation Age of Onset  . Hypertension Mother   . Hypertension Father   . Cancer Father    History   Social History  . Marital Status: Married    Spouse Name: N/A    Number of Children: N/A  . Years of Education: N/A   Social History Main Topics  . Smoking status: Former Smoker -- 0.50 packs/day for 41 years    Types: Cigarettes    Quit date: 04/18/2013  . Smokeless tobacco: Never Used  . Alcohol Use: No  .  Drug Use: No     Comment: every 2-3 days prior to stroke in May 2013, stopped using July 2013  . Sexual Activity: Yes    Partners: Female     Comment: declined condoms   Other Topics Concern  . None   Social History Narrative   Pt lives with wife and grandkid in [place].   Has applied for disability and is pending.   Worked in [employer] before about 3 years ago.         Review of Systems: Pertinent items are noted in HPI. Objective:  Physical Exam: Filed Vitals:   11/20/13 1423  BP: 100/60  Pulse: 86  Temp: 97.5 F (36.4 C)  TempSrc: Oral  Height: 6' (1.829 m)  Weight: 208 lb (94.348 kg)  SpO2: 96%   Vitals reviewed.  Oriented to person, place, and time. He appears well-developed and well-nourished. No distress.  Head: Normocephalic and atraumatic.    Cardiovascular: Normal rate, regular rhythm and intact distal pulses.  No murmur heard.  Pulmonary/Chest: Effort normal. Not tachypneic. No respiratory distress. He has no decreased breath sounds. No adventitious sounds heard bilaterally. Musculoskeletal: Normal range of motion. No edema. No calf tenderness noted on both legs. Neurological: He is alert and oriented to person, place, and time.  Skin: Skin is warm and dry. No erythema.  Psychiatric: He has a normal mood and affect. His behavior is normal.    Assessment & Plan:

## 2013-11-21 NOTE — Progress Notes (Signed)
Case discussed with Dr. Boggala at the time of the visit.  We reviewed the resident's history and exam and pertinent patient test results.  I agree with the assessment, diagnosis, and plan of care documented in the resident's note. 

## 2013-11-27 ENCOUNTER — Other Ambulatory Visit: Payer: Self-pay | Admitting: Internal Medicine

## 2013-11-28 ENCOUNTER — Encounter: Payer: Self-pay | Admitting: Internal Medicine

## 2013-11-28 ENCOUNTER — Ambulatory Visit (INDEPENDENT_AMBULATORY_CARE_PROVIDER_SITE_OTHER): Payer: Medicaid Other | Admitting: Internal Medicine

## 2013-11-28 VITALS — BP 113/78 | HR 97 | Temp 97.6°F | Wt 203.5 lb

## 2013-11-28 DIAGNOSIS — B20 Human immunodeficiency virus [HIV] disease: Secondary | ICD-10-CM

## 2013-11-28 NOTE — Progress Notes (Signed)
Patient ID: Jacob Joseph, male   DOB: [birth year], 58 y.o.   MRN: [id number]          Patient Active Problem List   Diagnosis Date Noted  . HIV INFECTION 04/27/2006    Priority: High  . Polysubstance abuse 04/27/2006    Priority: High  . Diabetes mellitus, type 2 10/19/2013  . Sinus tachycardia 10/19/2013  . Lipodystrophy 10/19/2013  . CAP (community acquired pneumonia) 09/28/2013  . COPD, moderate 05/31/2013  . Bilateral lower extremity pain 05/08/2013  . Loss of weight 04/24/2013  . DVT, lower extremity 04/18/2013  . Warfarin anticoagulation 04/17/2013  . Complex Lt Renal Cyst 03/22/2013  . Preventive measure 05/30/2012  . History of CVA 03/08/2012  . Normocytic anemia 03/08/2012  . Leukopenia 03/08/2012  . CKD Stage 2 03/08/2012  . ERECTILE DYSFUNCTION 04/25/2008  . INSOMNIA, CHRONIC 06/14/2007  . HEPATITIS C 04/27/2006  . TOBACCO USER 04/27/2006  . DEPRESSION 04/27/2006  . HYPERTENSION 04/27/2006  . DEGENERATIVE JOINT DISEASE 04/27/2006  . Chronic Low Back Pain 04/27/2006    Patient's Medications  New Prescriptions   No medications on file  Previous Medications   ALBUTEROL (PROVENTIL HFA;VENTOLIN HFA) 108 (90 BASE) MCG/ACT INHALER    Inhale 2 puffs into the lungs every 6 (six) hours as needed for wheezing or shortness of breath.   DARUNAVIR ETHANOLATE (PREZISTA) 800 MG TABLET    Take 1 tablet (800 mg total) by mouth daily with breakfast.   ELVITEGRAVIR-COBICISTAT-EMTRICITABINE-TENOFOVIR (STRIBILD) 150-150-200-300 MG TABS    Take 1 tablet by mouth daily with breakfast.   GLIPIZIDE (GLUCOTROL) 5 MG TABLET    Take 1 tablet (5 mg total) by mouth daily.   LISINOPRIL (PRINIVIL,ZESTRIL) 5 MG TABLET    Take 1 tablet (5 mg total) by mouth daily.   METOPROLOL TARTRATE (LOPRESSOR) 25 MG TABLET    Take 12.5 mg by mouth 2 (two) times daily.   MICONAZOLE (MICATIN) 2 % CREAM    Apply 1 application topically 2 (two) times daily. Apply to affected areas   QUETIAPINE FUMARATE  (SEROQUEL XR) 150 MG 24 HR TABLET    Take 150 mg by mouth at bedtime.   SPIRIVA HANDIHALER 18 MCG INHALATION CAPSULE    INHALE THE CONTENTS OF ONE CAPSULE USING HANDIHALER EVERY DAY   WARFARIN (COUMADIN) 5 MG TABLET    Take 2.5 mg by mouth daily.   ZOLPIDEM (AMBIEN) 10 MG TABLET    Take 10 mg by mouth at bedtime as needed. For sleep  Modified Medications   No medications on file  Discontinued Medications   No medications on file    Subjective: [name] is in for his routine visit. He has not missed any doses of his Stribild or Prezista recently. He says that he is not taking his Glucotrol. He has been out for the past 2 weeks. He says he spoke with his pharmacy was told that he did not have refills although it appears he was given 3 refills last month. He recently had some problem with acid indigestion. That resolved after taking Pepto-Bismol last week. He is also had some early satiety and intermittent nausea after eating without vomiting. He has some mild intermittent diarrhea that he takes Imodium for. Review of Systems: Pertinent items are noted in HPI.  Past Medical History  Diagnosis Date  . Hypertension   . HIV (human immunodeficiency virus infection)   . Depression   . Stroke 11/2011    Carotids Doppler negative. Right sided weakness resolved, initially on  Plavix for 3 months and then continued with ASA.    . TB lung, latent 1988    Treated  . Calcium oxalate crystals in urine 12/23/2011    Asymptomatic, no hematuria. Advised to take plenty of water.  . Hepatitis C     History  Substance Use Topics  . Smoking status: Former Smoker -- 0.50 packs/day for 41 years    Types: Cigarettes    Quit date: 04/18/2013  . Smokeless tobacco: Never Used  . Alcohol Use: No    Family History  Problem Relation Age of Onset  . Hypertension Mother   . Hypertension Father   . Cancer Father     Allergies  Allergen Reactions  . Statins Other (See Comments)    Elevated liver enzymes.  .  Metformin And Related Diarrhea    Objective: Temp: 97.6 F (36.4 C) (05/12 1039) Temp src: Oral (05/12 1039) BP: 113/78 mmHg (05/12 1039) Pulse Rate: 97 (05/12 1039) Body mass index is 27.59 kg/(m^2).  General: His weight is up recently Oral: No oropharyngeal lesions Skin: Dry, peeling rash on right hand Lungs: Clear Cor: Regular S1 and S2 with no murmurs Abdomen: Obese, soft and nontender  Lab Results Lab Results  Component Value Date   WBC 7.0 11/18/2013   HGB 15.6 11/18/2013   HCT 46.0 11/18/2013   MCV 89.7 11/18/2013   PLT 202 11/18/2013    Lab Results  Component Value Date   CREATININE 1.00 11/18/2013   BUN 4* 11/18/2013   NA 143 11/18/2013   K 4.0 11/18/2013   CL 111 11/18/2013   CO2 13* 10/17/2013    Lab Results  Component Value Date   ALT 99* 10/16/2013   AST 112* 10/16/2013   ALKPHOS 128* 10/16/2013   BILITOT 0.4 10/16/2013    Lab Results  Component Value Date   CHOL 120 10/16/2013   HDL 38* 10/16/2013   LDLCALC 58 10/16/2013   TRIG 120 10/16/2013   CHOLHDL 3.2 10/16/2013    Lab Results HIV 1 RNA Quant (copies/mL)  Date Value  10/16/2013 <20   04/04/2013 24*  12/20/2012 <20      CD4 T Cell Abs (/uL)  Date Value  10/16/2013 230*  04/04/2013 630   12/20/2012 420      Assessment: His adherence with his current antiretroviral regimen is very good and his infection remains under good control.  Plan: 1. Continue Stribild and Prezista 2. We checked with the pharmacy and we'll make sure he gets refills of his Glucotrol 3. Followup after lab work in 3 months   Michel Bickers, MD [facility] [place] [phone] pager   [phone] cell 11/28/2013, 11:01 AM

## 2013-11-29 ENCOUNTER — Ambulatory Visit (INDEPENDENT_AMBULATORY_CARE_PROVIDER_SITE_OTHER): Payer: Medicaid Other | Admitting: Cardiovascular Disease

## 2013-11-29 ENCOUNTER — Encounter: Payer: Self-pay | Admitting: Cardiovascular Disease

## 2013-11-29 VITALS — BP 122/87 | HR 93 | Ht 72.0 in | Wt 205.1 lb

## 2013-11-29 DIAGNOSIS — R000 Tachycardia, unspecified: Secondary | ICD-10-CM

## 2013-11-29 DIAGNOSIS — I498 Other specified cardiac arrhythmias: Secondary | ICD-10-CM

## 2013-11-29 MED ORDER — METOPROLOL TARTRATE 25 MG PO TABS: 25.0000 mg | ORAL_TABLET | Freq: Two times a day (BID) | ORAL | Status: DC

## 2013-11-29 NOTE — Progress Notes (Signed)
Jacob Joseph Date of Birth  March 15, 1956       [facility] Office [address], [address], [place], [place]  [address]   [place], [place]  [address] [place]   Fax  [phone]     Fax [phone]  Problem List: 1. Sinus tachycardia 2. HTN 3. HIV 4. Stroke 5, TB ( treated) 6. Hepatitis C 7. COPD 8. DVT  - on coumadin  Primary Cardiologist:  Doylene Canard  History of Present Illness:  Jacob Joseph is a 58 yo with multiple medical problems as noted above. He was recently in to see Dr. Aura Fey. She noted that he had sinus tachycardia. He was referred to our office for further evaluation.  He has seen Dr. Doylene Canard in the past and saw him as recently as Sept. 2014.  He denies any worsening shortness of breath. He denies any chest pain.  He's had a stroke and is somewhat limited. He's not had any additional trouble in doing his normal activities  Current Outpatient Prescriptions on File Prior to Visit  Medication Sig Dispense Refill  . albuterol (PROVENTIL HFA;VENTOLIN HFA) 108 (90 BASE) MCG/ACT inhaler Inhale 2 puffs into the lungs every 6 (six) hours as needed for wheezing or shortness of breath.  1 Inhaler  2  . Darunavir Ethanolate (PREZISTA) 800 MG tablet Take 1 tablet (800 mg total) by mouth daily with breakfast.  30 tablet  11  . elvitegravir-cobicistat-emtricitabine-tenofovir (STRIBILD) 150-150-200-300 MG TABS Take 1 tablet by mouth daily with breakfast.  30 tablet  11  . glipiZIDE (GLUCOTROL) 5 MG tablet Take 1 tablet (5 mg total) by mouth daily.  30 tablet  3  . lisinopril (PRINIVIL,ZESTRIL) 5 MG tablet Take 1 tablet (5 mg total) by mouth daily.  30 tablet  1  . metoprolol tartrate (LOPRESSOR) 25 MG tablet Take 12.5 mg by mouth 2 (two) times daily.      . miconazole (MICATIN) 2 % cream Apply 1 application topically 2 (two) times daily. Apply to affected areas  28.35 g  0  . QUEtiapine Fumarate (SEROQUEL  XR) 150 MG 24 hr tablet Take 150 mg by mouth at bedtime.      [name] Kitchen SPIRIVA HANDIHALER 18 MCG inhalation capsule INHALE THE CONTENTS OF ONE CAPSULE USING HANDIHALER EVERY DAY  30 capsule  0  . warfarin (COUMADIN) 5 MG tablet Take 2.5 mg by mouth daily.      [name] Kitchen zolpidem (AMBIEN) 10 MG tablet Take 10 mg by mouth at bedtime as needed. For sleep       No current facility-administered medications on file prior to visit.    Allergies  Allergen Reactions  . Statins Other (See Comments)    Elevated liver enzymes.  . Metformin And Related Diarrhea    Past Medical History  Diagnosis Date  . Hypertension   . HIV (human immunodeficiency virus infection)   . Depression   . Stroke 11/2011    Carotids Doppler negative. Right sided weakness resolved, initially on Plavix for 3 months and then continued with ASA.    . TB lung, latent 1988    Treated  . Calcium oxalate crystals in urine 12/23/2011    Asymptomatic, no hematuria. Advised to take plenty of water.  . Hepatitis C     Past Surgical History  Procedure Laterality Date  . Tee without cardioversion  12/07/2011    Procedure: TRANSESOPHAGEAL ECHOCARDIOGRAM (TEE);  Surgeon:  Birdie Riddle, MD;  Location: [facility];  Service: Cardiovascular;  Laterality: N/A;  . Inguinal hernia repair  01/16/2012    Procedure: HERNIA REPAIR INGUINAL INCARCERATED;  Surgeon: Zenovia Jarred, MD;  Location: [facility];  Service: General;  Laterality: Right;  . Hernia repair      History  Smoking status  . Former Smoker -- 0.50 packs/day for 41 years  . Types: Cigarettes  . Quit date: 04/18/2013  Smokeless tobacco  . Never Used    History  Alcohol Use No    Family History  Problem Relation Age of Onset  . Hypertension Mother   . Hypertension Father   . Cancer Father     Reviw of Systems:  Reviewed in the HPI.  All other systems are negative.  Physical Exam: Blood pressure 122/87, pulse 93, height 6' (1.829 m), weight 205 lb 1.9 oz (93.042 kg). Wt  Readings from Last 3 Encounters:  11/29/13 205 lb 1.9 oz (93.042 kg)  11/28/13 203 lb 8 oz (92.307 kg)  11/20/13 208 lb (94.348 kg)     General: Well developed, well nourished, in no acute distress.  Head: Normocephalic, atraumatic, sclera non-icteric, mucus membranes are moist,   Neck: Supple. Carotids are 2 + without bruits. No JVD   Lungs: Clear   Heart: rr, normla s1s2  Abdomen: Soft, non-tender, non-distended with normal bowel sounds.  Msk:  Strength and tone are normal   Extremities: No clubbing or cyanosis. No edema.  Distal pedal pulses are 2+ and equal    Neuro: CN II - XII intact.  Alert and oriented X 3.   Psych:  Normal   ECG: Nov 18, 2013:  NSR at 67.  Assessment / Plan:

## 2013-11-29 NOTE — Assessment & Plan Note (Addendum)
Jacob Joseph with asymptomatic sinus tachycardia. His heart rate is 93 today. His rhythm is clearly sinus. Sinus tachycardia is typically is secondary to noncardiac issues and I suspect it his tachycardia is due to his multiple medical issues. He's been started on low-dose beta blocker and his heart rate is better.  We'll increase his metoprolol to 25 mg twice a day.  He does not need any further cardiac testing. He'll followup with his general medical Dr.  The patient has been seeing Dr. Doylene Canard and saw him in Sept. 2014.  He should be referred back to Dr. Doylene Canard if he needs any further cardiology assistance.

## 2013-11-29 NOTE — Patient Instructions (Signed)
Your physician has recommended you make the following change in your medication:   1. Increase Metoprolol to 25 mg 1 tablet twice a day.  Your physician recommends that you schedule a follow-up appointment as needed.

## 2013-11-30 ENCOUNTER — Encounter: Payer: Self-pay | Admitting: Internal Medicine

## 2013-11-30 DIAGNOSIS — R000 Tachycardia, unspecified: Secondary | ICD-10-CM | POA: Insufficient documentation

## 2013-12-04 ENCOUNTER — Ambulatory Visit (INDEPENDENT_AMBULATORY_CARE_PROVIDER_SITE_OTHER): Payer: Medicaid Other | Admitting: Pharmacist

## 2013-12-04 DIAGNOSIS — Z7901 Long term (current) use of anticoagulants: Secondary | ICD-10-CM

## 2013-12-04 DIAGNOSIS — Z8673 Personal history of transient ischemic attack (TIA), and cerebral infarction without residual deficits: Secondary | ICD-10-CM

## 2013-12-04 DIAGNOSIS — I82409 Acute embolism and thrombosis of unspecified deep veins of unspecified lower extremity: Secondary | ICD-10-CM

## 2013-12-04 LAB — POCT INR: INR: 2.1

## 2013-12-06 NOTE — Progress Notes (Signed)
Anti-Coagulation Progress Note  Jacob Joseph is a 58 y.o. male who is currently on an anti-coagulation regimen.    RECENT RESULTS: Recent results are below, the most recent result is correlated with a dose of 15 mg. per week: Lab Results  Component Value Date   INR 2.10 12/04/2013   INR 2.45* 11/18/2013   INR 3.80 11/06/2013    ANTI-COAG DOSE: Anticoagulation Dose Instructions as of 12/04/2013     [name] Tue Wed Thu Fri Sat   New Dose 2.5 mg 2.5 mg 2.5 mg 2.5 mg 2.5 mg 2.5 mg 2.5 mg       ANTICOAG SUMMARY: Anticoagulation Episode Summary   Current INR goal 2.0-3.0  Next INR check 01/01/2014  INR from last check 2.10 (12/04/2013)  Weekly max dose   Target end date Indefinite  [facility]  Preferred lab   Send INR reminders to    Indications  History of CVA [V12.54] Warfarin anticoagulation [V58.61]        Comments         ANTICOAG TODAY: Anticoagulation Summary as of 12/04/2013   INR goal 2.0-3.0  Selected INR 2.10 (12/04/2013)  Next INR check 01/01/2014  Target end date Indefinite   Indications  History of CVA [V12.54] Warfarin anticoagulation [V58.61]      Anticoagulation Episode Summary   [facility]   Preferred lab    Send INR reminders to    Comments       PATIENT INSTRUCTIONS: Patient Instructions  Patient instructed to take medications as defined in the Anti-coagulation Track section of this encounter.  Patient instructed to take today's dose.  Patient verbalized understanding of these instructions.       FOLLOW-UP Return in 4 weeks (on 01/01/2014) for Follow up INR at 2:30PM.  [name], III Pharm.D., CACP

## 2013-12-06 NOTE — Patient Instructions (Signed)
Patient instructed to take medications as defined in the Anti-coagulation Track section of this encounter.  Patient instructed to take today's dose.  Patient verbalized understanding of these instructions.    

## 2013-12-27 ENCOUNTER — Other Ambulatory Visit: Payer: Self-pay | Admitting: Internal Medicine

## 2013-12-27 DIAGNOSIS — B20 Human immunodeficiency virus [HIV] disease: Secondary | ICD-10-CM

## 2014-02-01 ENCOUNTER — Other Ambulatory Visit: Payer: Self-pay | Admitting: Internal Medicine

## 2014-02-28 ENCOUNTER — Other Ambulatory Visit: Payer: Medicaid Other

## 2014-03-03 ENCOUNTER — Other Ambulatory Visit: Payer: Self-pay | Admitting: Internal Medicine

## 2014-03-14 ENCOUNTER — Encounter: Payer: Self-pay | Admitting: Internal Medicine

## 2014-03-14 ENCOUNTER — Encounter: Payer: Medicaid Other | Admitting: Internal Medicine

## 2014-03-14 ENCOUNTER — Telehealth: Payer: Self-pay | Admitting: *Deleted

## 2014-03-14 ENCOUNTER — Ambulatory Visit: Payer: Medicaid Other | Admitting: Internal Medicine

## 2014-03-14 NOTE — Telephone Encounter (Signed)
Made new appt. 

## 2014-03-19 ENCOUNTER — Other Ambulatory Visit (INDEPENDENT_AMBULATORY_CARE_PROVIDER_SITE_OTHER): Payer: Medicaid Other

## 2014-03-19 DIAGNOSIS — B20 Human immunodeficiency virus [HIV] disease: Secondary | ICD-10-CM

## 2014-03-19 NOTE — Addendum Note (Signed)
Addended by: Dolan Amen D on: 03/19/2014 10:47 AM   Modules accepted: Orders

## 2014-03-20 LAB — HIV-1 RNA QUANT-NO REFLEX-BLD
HIV 1 RNA Quant: 36788 copies/mL — ABNORMAL HIGH (ref <20)
HIV-1 RNA Quant, Log: 4.57 {Log} — ABNORMAL HIGH (ref <1.30)

## 2014-03-20 LAB — T-HELPER CELL (CD4) - (RCID CLINIC ONLY)
CD4 % Helper T Cell: 19 % — ABNORMAL LOW (ref 33–55)
CD4 T Cell Abs: 570 /uL (ref 400–2700)

## 2014-03-27 ENCOUNTER — Ambulatory Visit: Payer: Medicaid Other | Admitting: Internal Medicine

## 2014-04-05 ENCOUNTER — Other Ambulatory Visit: Payer: Self-pay | Admitting: Internal Medicine

## 2014-05-07 ENCOUNTER — Encounter: Payer: Self-pay | Admitting: Internal Medicine

## 2014-05-09 ENCOUNTER — Ambulatory Visit: Payer: Medicaid Other | Admitting: Internal Medicine

## 2014-06-25 ENCOUNTER — Other Ambulatory Visit: Payer: Medicare Other

## 2014-06-25 DIAGNOSIS — B20 Human immunodeficiency virus [HIV] disease: Secondary | ICD-10-CM

## 2014-06-25 LAB — COMPLETE METABOLIC PANEL WITH GFR
ALT: 44 U/L (ref 0–53)
AST: 64 U/L — AB (ref 0–37)
Albumin: 3.4 g/dL — ABNORMAL LOW (ref 3.5–5.2)
Alkaline Phosphatase: 106 U/L (ref 39–117)
BUN: 8 mg/dL (ref 6–23)
CALCIUM: 9 mg/dL (ref 8.4–10.5)
CHLORIDE: 105 meq/L (ref 96–112)
CO2: 21 meq/L (ref 19–32)
Creat: 0.85 mg/dL (ref 0.50–1.35)
GFR, Est African American: >89 mL/min
GFR, Est Non African American: >89 mL/min
Glucose, Bld: 110 mg/dL — ABNORMAL HIGH (ref 70–99)
POTASSIUM: 3.8 meq/L (ref 3.5–5.3)
Sodium: 136 mEq/L (ref 135–145)
Total Bilirubin: 0.5 mg/dL (ref 0.2–1.2)
Total Protein: 8.2 g/dL (ref 6.0–8.3)

## 2014-06-25 LAB — CBC WITH DIFFERENTIAL/PLATELET
BASOS ABS: 0 10*3/uL (ref 0.0–0.1)
Basophils Relative: 0 % (ref 0–1)
Eosinophils Absolute: 0.3 10*3/uL (ref 0.0–0.7)
Eosinophils Relative: 6 % — ABNORMAL HIGH (ref 0–5)
HEMATOCRIT: 44.2 % (ref 39.0–52.0)
HEMOGLOBIN: 15 g/dL (ref 13.0–17.0)
Lymphocytes Relative: 46 % (ref 12–46)
Lymphs Abs: 2.5 10*3/uL (ref 0.7–4.0)
MCH: 26.6 pg (ref 26.0–34.0)
MCHC: 33.9 g/dL (ref 30.0–36.0)
MCV: 78.4 fL (ref 78.0–100.0)
MONOS PCT: 13 % — AB (ref 3–12)
MPV: 11.6 fL (ref 9.4–12.4)
Monocytes Absolute: 0.7 10*3/uL (ref 0.1–1.0)
NEUTROS ABS: 1.9 10*3/uL (ref 1.7–7.7)
Neutrophils Relative %: 35 % — ABNORMAL LOW (ref 43–77)
PLATELETS: 160 10*3/uL (ref 150–400)
RBC: 5.64 MIL/uL (ref 4.22–5.81)
RDW: 14.7 % (ref 11.5–15.5)
WBC: 5.4 10*3/uL (ref 4.0–10.5)

## 2014-06-26 LAB — T-HELPER CELL (CD4) - (RCID CLINIC ONLY)
CD4 % Helper T Cell: 17 % — ABNORMAL LOW (ref 33–55)
CD4 T CELL ABS: 350 /uL — AB (ref 400–2700)

## 2014-06-27 LAB — HIV-1 RNA QUANT-NO REFLEX-BLD
HIV 1 RNA Quant: 21678 copies/mL — ABNORMAL HIGH (ref <20)
HIV-1 RNA Quant, Log: 4.34 {Log} — ABNORMAL HIGH (ref <1.30)

## 2014-07-11 ENCOUNTER — Ambulatory Visit (INDEPENDENT_AMBULATORY_CARE_PROVIDER_SITE_OTHER): Payer: Medicare Other | Admitting: Internal Medicine

## 2014-07-11 ENCOUNTER — Encounter: Payer: Self-pay | Admitting: Internal Medicine

## 2014-07-11 VITALS — BP 161/104 | HR 73 | Temp 98.5°F | Wt 187.2 lb

## 2014-07-11 DIAGNOSIS — B1710 Acute hepatitis C without hepatic coma: Secondary | ICD-10-CM

## 2014-07-11 DIAGNOSIS — Z23 Encounter for immunization: Secondary | ICD-10-CM | POA: Diagnosis present

## 2014-07-11 DIAGNOSIS — B20 Human immunodeficiency virus [HIV] disease: Secondary | ICD-10-CM

## 2014-07-11 NOTE — Progress Notes (Signed)
Patient ID: Jacob Joseph, male   DOB: 1955/10/14, 58 y.o.   MRN: [id number]          Patient Active Problem List   Diagnosis Date Noted  . Human immunodeficiency virus (HIV) disease 04/27/2006    Priority: High  . Acute hepatitis C virus infection 04/27/2006    Priority: High  . Polysubstance abuse 04/27/2006    Priority: High  . Tachycardia 11/30/2013  . Diabetes mellitus, type 2 10/19/2013  . Sinus tachycardia 10/19/2013  . Lipodystrophy 10/19/2013  . CAP (community acquired pneumonia) 09/28/2013  . COPD, moderate 05/31/2013  . Bilateral lower extremity pain 05/08/2013  . Loss of weight 04/24/2013  . DVT, lower extremity 04/18/2013  . Warfarin anticoagulation 04/17/2013  . Complex Lt Renal Cyst 03/22/2013  . Preventive measure 05/30/2012  . History of CVA 03/08/2012  . Normocytic anemia 03/08/2012  . Leukopenia 03/08/2012  . CKD Stage 2 03/08/2012  . ERECTILE DYSFUNCTION 04/25/2008  . INSOMNIA, CHRONIC 06/14/2007  . TOBACCO USER 04/27/2006  . DEPRESSION 04/27/2006  . HYPERTENSION 04/27/2006  . DEGENERATIVE JOINT DISEASE 04/27/2006  . Chronic Low Back Pain 04/27/2006    Patient's Medications  New Prescriptions   No medications on file  Previous Medications   ALBUTEROL (PROVENTIL HFA;VENTOLIN HFA) 108 (90 BASE) MCG/ACT INHALER    Inhale 2 puffs into the lungs every 6 (six) hours as needed for wheezing or shortness of breath.   GLIPIZIDE (GLUCOTROL) 5 MG TABLET    TAKE 1 TABLET BY MOUTH DAILY   LISINOPRIL (PRINIVIL,ZESTRIL) 5 MG TABLET    Take 1 tablet (5 mg total) by mouth daily.   LISINOPRIL (PRINIVIL,ZESTRIL) 5 MG TABLET    TAKE 2 TABLETS BY MOUTH EVERY DAY   METOPROLOL TARTRATE (LOPRESSOR) 25 MG TABLET    Take 1 tablet (25 mg total) by mouth 2 (two) times daily.   MICONAZOLE (MICATIN) 2 % CREAM    Apply 1 application topically 2 (two) times daily. Apply to affected areas   PREZISTA 800 MG TABLET    TAKE 1 TABLET BY MOUTH EVERY DAY WITH BREAKFAST   QUETIAPINE  FUMARATE (SEROQUEL XR) 150 MG 24 HR TABLET    Take 150 mg by mouth at bedtime.   SPIRIVA HANDIHALER 18 MCG INHALATION CAPSULE    INHALE THE CONTENTS OF 1 CASPSULE USING HANDIHALER EVERY DAY   STRIBILD 150-150-200-300 MG TABS TABLET    TAKE 1 TABLET BY MOUTH EVERY DAY WITH BREAKFAST   WARFARIN (COUMADIN) 5 MG TABLET    Take 2.5 mg by mouth daily.   ZOLPIDEM (AMBIEN) 10 MG TABLET    Take 10 mg by mouth at bedtime as needed. For sleep  Modified Medications   No medications on file  Discontinued Medications   No medications on file    Subjective: Jacob Joseph is in for his first visit in a while. He stopped taking his Stribild and Prezista last June. He also stopped taking his Coumadin and blood pressure medications at that time he has continued to take his Seroquel and Ambien. He relapsed with alcohol and cocaine use which he identifies as the reason he stopped taking his other medications. He and his wife, Arelia Sneddon, who is with him today are both hoping to get sober again and back into care.   Review of Systems: Constitutional: positive for fatigue, negative for anorexia, chills, fevers, sweats and weight loss Eyes: negative Ears, nose, mouth, throat, and face: negative Respiratory: negative Cardiovascular: negative Gastrointestinal: negative Genitourinary:negative  Past Medical History  Diagnosis  Date  . Hypertension   . HIV (human immunodeficiency virus infection)   . Depression   . Stroke 11/2011    Carotids Doppler negative. Right sided weakness resolved, initially on Plavix for 3 months and then continued with ASA.    . TB lung, latent 1988    Treated  . Calcium oxalate crystals in urine 12/23/2011    Asymptomatic, no hematuria. Advised to take plenty of water.  . Hepatitis C     History  Substance Use Topics  . Smoking status: Former Smoker -- 0.50 packs/day for 41 years    Types: Cigarettes    Quit date: 04/18/2013  . Smokeless tobacco: Never Used  . Alcohol Use: No    Family  History  Problem Relation Age of Onset  . Hypertension Mother   . Hypertension Father   . Cancer Father     Allergies  Allergen Reactions  . Statins Other (See Comments)    Elevated liver enzymes.  . Metformin And Related Diarrhea    Objective: Temp: 98.5 F (36.9 C) (12/23 1548) Temp Source: Oral (12/23 1548) BP: 161/104 mmHg (12/23 1548) Pulse Rate: 73 (12/23 1548) Body mass index is 25.39 kg/(m^2).  General: Quiet and in no distress Oral: No oropharyngeal lesions Skin: No rash Lungs: Clear Cor: Regular S1 and S2 no murmurs Abdomen: Soft and nontender  Lab Results Lab Results  Component Value Date   WBC 5.4 06/25/2014   HGB 15.0 06/25/2014   HCT 44.2 06/25/2014   MCV 78.4 06/25/2014   PLT 160 06/25/2014    Lab Results  Component Value Date   CREATININE 0.85 06/25/2014   BUN 8 06/25/2014   NA 136 06/25/2014   K 3.8 06/25/2014   CL 105 06/25/2014   CO2 21 06/25/2014    Lab Results  Component Value Date   ALT 44 06/25/2014   AST 64* 06/25/2014   ALKPHOS 106 06/25/2014   BILITOT 0.5 06/25/2014    Lab Results  Component Value Date   CHOL 120 10/16/2013   HDL 38* 10/16/2013   LDLCALC 58 10/16/2013   TRIG 120 10/16/2013   CHOLHDL 3.2 10/16/2013    Lab Results HIV 1 RNA QUANT (copies/mL)  Date Value  06/25/2014 21678*  03/19/2014 92119*  10/16/2013 <20   CD4 T CELL ABS (/uL)  Date Value  06/25/2014 350*  03/19/2014 570  10/16/2013 230*     Assessment: Not surprisingly his viral load has reactivated off of therapy and his CD4 has declined slightly. I will check an HIV genotype resistance assay and see him back next month to consider what regimen to put him on.  I will recheck his hepatitis C viral load and consider hepatitis C therapy.  I will refer him from substance abuse counseling.  He will restart his blood pressure medication immediately.  Plan: 1. Check HIV genotype resistance assay 2. Check hepatitis C viral load 3. Referral  for mental health counseling 4. Restart antihypertensive regimen 5. He has a primary care visit scheduled for 07/17/2014 6. Follow-up here in 3-4 weeks   Michel Bickers, MD [facility] [phone] pager   [phone] cell 07/11/2014, 4:39 PM

## 2014-07-17 ENCOUNTER — Ambulatory Visit (INDEPENDENT_AMBULATORY_CARE_PROVIDER_SITE_OTHER): Payer: Medicare Other | Admitting: Internal Medicine

## 2014-07-17 ENCOUNTER — Encounter: Payer: Self-pay | Admitting: Internal Medicine

## 2014-07-17 VITALS — BP 150/92 | HR 66 | Temp 98.0°F | Ht 72.0 in | Wt 186.0 lb

## 2014-07-17 DIAGNOSIS — Z21 Asymptomatic human immunodeficiency virus [HIV] infection status: Secondary | ICD-10-CM

## 2014-07-17 DIAGNOSIS — I82402 Acute embolism and thrombosis of unspecified deep veins of left lower extremity: Secondary | ICD-10-CM

## 2014-07-17 DIAGNOSIS — F1910 Other psychoactive substance abuse, uncomplicated: Secondary | ICD-10-CM

## 2014-07-17 DIAGNOSIS — I10 Essential (primary) hypertension: Secondary | ICD-10-CM

## 2014-07-17 DIAGNOSIS — Z86718 Personal history of other venous thrombosis and embolism: Secondary | ICD-10-CM

## 2014-07-17 DIAGNOSIS — F1410 Cocaine abuse, uncomplicated: Secondary | ICD-10-CM

## 2014-07-17 DIAGNOSIS — F1010 Alcohol abuse, uncomplicated: Secondary | ICD-10-CM

## 2014-07-17 DIAGNOSIS — E119 Type 2 diabetes mellitus without complications: Secondary | ICD-10-CM

## 2014-07-17 LAB — GLUCOSE, CAPILLARY: GLUCOSE-CAPILLARY: 129 mg/dL — AB (ref 70–99)

## 2014-07-17 LAB — POCT GLYCOSYLATED HEMOGLOBIN (HGB A1C): HEMOGLOBIN A1C: 5.8

## 2014-07-17 LAB — POCT INR: INR: 2.3

## 2014-07-17 MED ORDER — LISINOPRIL 10 MG PO TABS: 10.0000 mg | ORAL_TABLET | Freq: Every day | ORAL | Status: DC

## 2014-07-17 NOTE — Assessment & Plan Note (Signed)
BP Readings from Last 3 Encounters:  07/17/14 150/92  07/11/14 161/104  11/29/13 122/87    Lab Results  Component Value Date   NA 136 06/25/2014   K 3.8 06/25/2014   CREATININE 0.85 06/25/2014    Assessment: Blood pressure control: moderately elevated Progress toward BP goal:  deteriorated Comments: Patient denies active cocaine use for several weeks, however, he is taking metoprolol.  Plan: Medications:  Increase lisinopril from 5 mg daily to 10 mg daily. Continue with metoprolol 25 mg twice a day. Educational resources provided:   Self management tools provided:   Other plans: He will follow-up with me in 2-3 weeks. I will consider discontinuation of metoprolol and increase dose of his lisinopril given his history of cocaine abuse.

## 2014-07-17 NOTE — Assessment & Plan Note (Signed)
Lab Results  Component Value Date   HGBA1C 5.8 07/17/2014   HGBA1C 7.5 10/19/2013   HGBA1C 5.1 01/11/2013     Assessment: Diabetes control: good control (HgbA1C at goal) Progress toward A1C goal:  at goal Comments: Patient has lost about 20 pounds in the last 6 months. He has been off glipizide for the same duration. Wt Readings from Last 5 Encounters:  07/17/14 186 lb (84.369 kg)  07/11/14 187 lb 4 oz (84.936 kg)  11/29/13 205 lb 1.9 oz (93.042 kg)  11/28/13 203 lb 8 oz (92.307 kg)  11/20/13 208 lb (94.348 kg)     Plan: Medications:  Will discontinue glipizide for now. Home glucose monitoring: Frequency: once a day Timing: before breakfast Instruction/counseling given: reminded to get eye exam Educational resources provided:   Self management tools provided:   Other plans: He apparently has an ophthalmologist who is planning to perform a cataract surgery. Will request for records. -Diabetic foot exam has been performed. -We'll obtain urine microalbumin to creatinine ratio today. -We'll continue to monitor his A1c closely and resume antidiabetic medications if needed.

## 2014-07-17 NOTE — Patient Instructions (Addendum)
General Instructions: Please increase Lisinopril from 5 mg to 10 mg daily  Please stop taking Glipizide  We will need to set up eye exam   Please bring your medicines with you each time you come to clinic.  Medicines may include prescription medications, over-the-counter medications, herbal remedies, eye drops, vitamins, or other pills.   Progress Toward Treatment Goals:  Treatment Goal 07/17/2014  Hemoglobin A1C at goal  Blood pressure deteriorated  Stop smoking smoking the same amount  Prevent falls unchanged    Self Care Goals & Plans:  Self Care Goal 07/17/2014  Manage my medications take my medicines as prescribed; bring my medications to every visit; refill my medications on time  Monitor my health -  Eat healthy foods -  Be physically active -  Stop smoking -  Prevent falls -  Meeting treatment goals -    Home Blood Glucose Monitoring 07/17/2014  Check my blood sugar once a day  When to check my blood sugar before breakfast     Care Management & Community Referrals:  Referral 10/19/2013  Referrals made for care management support none needed  Referrals made to community resources none

## 2014-07-17 NOTE — Progress Notes (Signed)
Patient ID: Jacob Joseph, male   DOB: Apr 20, 1956, 58 y.o.   MRN: [id number]   Subjective:   HPI: [name] is a 58 y.o. gentleman with past medical history of HIV/AIDS, hypertension, history of DVT, CVA, chronic back pain, type 2 diabetes, COPD, among other health problems, presents for follow-up visit.  Patient has been away from healthcare for the last 6 months due to a relapse of alcohol and cocaine abuse. He returned himself to HIV clinic 2 weeks ago after being off his HIV medications for about 6 months. Viral resistance is being tested before HAART can be restarted.  He otherwise states feeling well and has no specific complaints today.  Please see the A&P for the status of the pt's chronic medical problems.  ROS: Constitutional: Denies fever, chills, diaphoresis, appetite change and fatigue.  Respiratory: Denies SOB, DOE, cough, chest tightness, and wheezing. Denies chest pain. CVS: No chest pain, palpitations and leg swelling.  GI: No abdominal pain, nausea, vomiting, bloody stools GU: No dysuria, frequency, hematuria, or flank pain.  MSK: No myalgias, back pain, joint swelling, arthralgias  Psych: No depression symptoms. No SI or SA.    Objective:  Physical Exam: Filed Vitals:   07/17/14 1115  BP: 150/92  Pulse: 66  Temp: 98 F (36.7 C)  TempSrc: Oral  Height: 6' (1.829 m)  Weight: 186 lb (84.369 kg)  SpO2: 100%   General: Well nourished. No acute distress.  HEENT: Normal oral mucosa. MMM.  Lungs: CTA bilaterally , but has some mild wheezing bilaterally Heart: RRR; no extra sounds or murmurs  Abdomen: Non-distended, normal bowel sounds, soft, nontender; no hepatosplenomegaly  Extremities: No pedal edema. No joint swelling or tenderness. No evidence of DVT. Neurologic: Normal EOM,  Alert and oriented x3. No obvious neurologic/cranial nerve deficits.  Assessment & Plan:  Discussed case with my attending in the clinic, Dr. Ellwood Joseph See problem based  charting.

## 2014-07-17 NOTE — Assessment & Plan Note (Signed)
Patient relapsed into alcohol and cocaine abuse in the summer of 2015. Currently denies active use and is interested in resuming medical care.  Plan -Patient has been referred to drug abuse counseling by ID -Emphasized to the patient quit alcohol and cocaine.

## 2014-07-17 NOTE — Progress Notes (Signed)
Internal Medicine Clinic Attending  Case discussed with Dr. Kazibwe at the time of the visit.  We reviewed the resident's history and exam and pertinent patient test results.  I agree with the assessment, diagnosis, and plan of care documented in the resident's note. 

## 2014-07-17 NOTE — Assessment & Plan Note (Signed)
Patient states that he most recently resumed his Coumadin therapy after following up with the ID last week. His INR is therapeutic at 2.3 today.  Plan -I will discuss with him about benefits and risks of continuing with Coumadin as it was planned to be discontinued around and 03/2014. -He will most likely need a d-dimer Check 4 weeks after after being off Coumadin. -In the meantime, will continue to follow-up with Dr. Elie Confer with INR checks.

## 2014-07-18 LAB — MICROALBUMIN / CREATININE URINE RATIO
Creatinine, Urine: 260.5 mg/dL
MICROALB/CREAT RATIO: 43.8 mg/g — AB (ref 0.0–30.0)
Microalb, Ur: 11.4 mg/dL — ABNORMAL HIGH (ref <2.0)

## 2014-07-18 LAB — HEPATITIS C RNA QUANTITATIVE
HCV Quantitative Log: 6.75 {Log} — ABNORMAL HIGH (ref <1.18)
HCV Quantitative: 5599936 IU/mL — ABNORMAL HIGH (ref <15)

## 2014-07-23 ENCOUNTER — Ambulatory Visit (INDEPENDENT_AMBULATORY_CARE_PROVIDER_SITE_OTHER): Payer: Medicare Other | Admitting: Pharmacist

## 2014-07-23 DIAGNOSIS — Z8673 Personal history of transient ischemic attack (TIA), and cerebral infarction without residual deficits: Secondary | ICD-10-CM | POA: Diagnosis not present

## 2014-07-23 DIAGNOSIS — Z7901 Long term (current) use of anticoagulants: Secondary | ICD-10-CM

## 2014-07-23 LAB — POCT INR: INR: 3.7

## 2014-07-23 NOTE — Progress Notes (Signed)
Anti-Coagulation Progress Note  Jacob Joseph is a 59 y.o. male who is currently on an anti-coagulation regimen.    RECENT RESULTS: Recent results are below, the most recent result is correlated with a dose of 22.5 mg. per week: Lab Results  Component Value Date   INR 3.70 07/23/2014   INR 2.3 07/17/2014   INR 2.10 12/04/2013    ANTI-COAG DOSE: Anticoagulation Dose Instructions as of 07/23/2014      Jacob Joseph Tue Wed Thu Fri Sat   New Dose 2.5 mg 2.5 mg 5 mg 2.5 mg 2.5 mg 5 mg 2.5 mg       ANTICOAG SUMMARY: Anticoagulation Episode Summary    Current INR goal 2.0-3.0  Next INR check 08/13/2014  INR from last check 3.70! (07/23/2014)  Weekly max dose   Target end date Indefinite  [facility]  Preferred lab   Send INR reminders to    Indications  History of CVA [Z86.73] Warfarin anticoagulation [Z79.01]        Comments         ANTICOAG TODAY: Anticoagulation Summary as of 07/23/2014    INR goal 2.0-3.0  Selected INR 3.70! (07/23/2014)  Next INR check 08/13/2014  Target end date Indefinite   Indications  History of CVA [Z86.73] Warfarin anticoagulation [Z79.01]      Anticoagulation Episode Summary    [facility]   Preferred lab    Send INR reminders to    Comments       PATIENT INSTRUCTIONS: Patient Instructions  Patient instructed to take medications as defined in the Anti-coagulation Track section of this encounter.  Patient instructed to take today's dose.  Patient verbalized understanding of these instructions.       FOLLOW-UP Return in 3 weeks (on 08/13/2014) for Follow up INR at 1100h.  Jacob Joseph, III Pharm.D., CACP

## 2014-07-23 NOTE — Patient Instructions (Signed)
Patient instructed to take medications as defined in the Anti-coagulation Track section of this encounter.  Patient instructed to take today's dose.  Patient verbalized understanding of these instructions.    

## 2014-07-25 ENCOUNTER — Other Ambulatory Visit: Payer: Self-pay | Admitting: Pharmacist Clinician (PhC)/ Clinical Pharmacy Specialist

## 2014-07-25 MED ORDER — LEDIPASVIR-SOFOSBUVIR 90-400 MG PO TABS: 1.0000 | ORAL_TABLET | Freq: Every day | ORAL | Status: DC

## 2014-07-25 MED ORDER — ELVITEG-COBIC-EMTRICIT-TENOFAF 150-150-200-10 MG PO TABS: 1.0000 | ORAL_TABLET | Freq: Every day | ORAL | Status: DC

## 2014-07-25 NOTE — Progress Notes (Signed)
Patient ID: Jacob Joseph, male   DOB: 09-22-1955, 59 y.o.   MRN: [id number] HPI: Jacob Joseph is a 59 y.o. male who is here with his wife for f/u with their HIV  Allergies: Allergies  Allergen Reactions  . Statins Other (See Comments)    Elevated liver enzymes.  . Metformin And Related Diarrhea    Vitals:    Past Medical History: Past Medical History  Diagnosis Date  . Hypertension   . HIV (human immunodeficiency virus infection)   . Depression   . Stroke 11/2011    Carotids Doppler negative. Right sided weakness resolved, initially on Plavix for 3 months and then continued with ASA.    . TB lung, latent 1988    Treated  . Calcium oxalate crystals in urine 12/23/2011    Asymptomatic, no hematuria. Advised to take plenty of water.  . Hepatitis C     Social History: History   Social History  . Marital Status: Married    Spouse Name: N/A    Number of Children: N/A  . Years of Education: N/A   Social History Main Topics  . Smoking status: Current Every Day Smoker -- 0.50 packs/day for 41 years    Types: Cigarettes    Last Attempt to Quit: 04/18/2013  . Smokeless tobacco: Never Used     Comment: 1/2 PPD  . Alcohol Use: No  . Drug Use: 1.00 per week    Special: "Crack" cocaine, Cocaine, Marijuana     Comment: still using "crack" and marijuana about once a week, trying to stop again  . Sexual Activity:    Partners: Female     Comment: declined condoms   Other Topics Concern  . None   Social History Narrative   Pt lives with wife and grandkid in [place].   Has applied for disability and is pending.   Worked in [place] before about 3 years ago.          Previous Regimen: Stribild + DRV  Current Regimen: Off  HIV Genotype Composite Data Genotype Dates: 10/24/2907  Mutations in Felicity impact drug susceptibility RT Mutations L74V, M184V, K219N, K103N, V108I  PI Mutations None  Integrase Mutations    Interpretation of Genotype Data per [employer] HIV  Database Nucleoside RTIs  lamivudine (3TC) High-level resistance abacavir (ABC) High-level resistance zidovudine (AZT) Susceptible stavudine (D4T) Susceptible didanosine (DDI) High-level resistance emtricitabine (FTC) High-level resistance tenofovir (TDF) Susceptible   Non-Nucleoside RTIs  efavirenz (EFV) High-level resistance etravirine (ETR) Susceptible nevirapine (NVP) High-level resistance rilpivirine (RPV) Susceptible   Protease Inhibitors  atazanavir/r (ATV/r) Susceptible darunavir/r (DRV/r) Susceptible fosamprenavir/r (FPV/r) Susceptible indinavir/r (IDV/r) Susceptible lopinavir/r (LPV/r) Susceptible nelfinavir (NFV) Susceptible saquinavir/r (SQV/r) Susceptible tipranavir/r (TPV/r) Susceptible    Integrase Inhibitors      Labs: HIV 1 RNA QUANT (copies/mL)  Date Value  06/25/2014 21678*  03/19/2014 60109*  10/16/2013 <20   CD4 T CELL ABS (/uL)  Date Value  06/25/2014 350*  03/19/2014 570  10/16/2013 230*   HEP B S AB (no units)  Date Value  03/09/2012 NEGATIVE   HEPATITIS B SURFACE AG (no units)  Date Value  03/09/2012 NEGATIVE   HCV AB (no units)  Date Value  09/13/2006 YES    CrCl: Estimated Creatinine Clearance: 105.2 mL/min (by C-G formula based on Cr of 0.85).  Lipids:    Component Value Date/Time   CHOL 120 10/16/2013 1514   TRIG 120 10/16/2013 1514   HDL 38* 10/16/2013 1514   CHOLHDL 3.2 10/16/2013 1514  VLDL 24 10/16/2013 1514   LDLCALC 58 10/16/2013 1514    Assessment: 59 yo who has been out of care for a while and has stopped his ART, therefore, his VL is up. He has had resistance issues in the past and that was the reason for Stribild+DRV. Both are trying to get back into care now. Both are also infected with Hep C. He does have medicare part D but she doesn't. We are planning to restart them on ART first the will address the Hep C issue. I addressed compliance with both of them.   Recommendations: Stribild + DRV  Wilfred Lacy, PharmD Clinical Infectious [facility] 07/25/2014, 11:47 AM

## 2014-07-26 DIAGNOSIS — F329 Major depressive disorder, single episode, unspecified: Secondary | ICD-10-CM | POA: Diagnosis not present

## 2014-07-27 LAB — HIV-1 GENOTYPR PLUS

## 2014-07-31 ENCOUNTER — Ambulatory Visit: Payer: Medicare Other

## 2014-07-31 ENCOUNTER — Ambulatory Visit: Payer: Medicare Other | Admitting: Pharmacist Clinician (PhC)/ Clinical Pharmacy Specialist

## 2014-07-31 DIAGNOSIS — B1710 Acute hepatitis C without hepatic coma: Secondary | ICD-10-CM

## 2014-07-31 NOTE — Progress Notes (Signed)
Patient ID: Jacob Joseph, male   DOB: November 07, 1955, 59 y.o.   MRN: [id number] HPI: Jacob Joseph is a 59 y.o. male who is here with his wife to discuss hepatitis C treatment.   Lab Results  Component Value Date   HCVGENOTYPE 1a 04/06/2012    Allergies: Allergies  Allergen Reactions  . Statins Other (See Comments)    Elevated liver enzymes.  . Metformin And Related Diarrhea    Vitals:    Past Medical History: Past Medical History  Diagnosis Date  . Hypertension   . HIV (human immunodeficiency virus infection)   . Depression   . Stroke 11/2011    Carotids Doppler negative. Right sided weakness resolved, initially on Plavix for 3 months and then continued with ASA.    . TB lung, latent 1988    Treated  . Calcium oxalate crystals in urine 12/23/2011    Asymptomatic, no hematuria. Advised to take plenty of water.  . Hepatitis C     Social History: History   Social History  . Marital Status: Married    Spouse Name: N/A    Number of Children: N/A  . Years of Education: N/A   Social History Main Topics  . Smoking status: Current Every Day Smoker -- 0.50 packs/day for 41 years    Types: Cigarettes    Last Attempt to Quit: 04/18/2013  . Smokeless tobacco: Never Used     Comment: 1/2 PPD  . Alcohol Use: No  . Drug Use: 1.00 per week    Special: "Crack" cocaine, Cocaine, Marijuana     Comment: still using "crack" and marijuana about once a week, trying to stop again  . Sexual Activity:    Partners: Female     Comment: declined condoms   Other Topics Concern  . Not on file   Social History Narrative   Pt lives with wife and grandkid in [place].   Has applied for disability and is pending.   Worked in [place] before about 3 years ago.          Labs: HIV 1 RNA QUANT (copies/mL)  Date Value  06/25/2014 21678*  03/19/2014 29476*  10/16/2013 <20   CD4 T CELL ABS (/uL)  Date Value  06/25/2014 350*  03/19/2014 570  10/16/2013 230*   HEP B S AB (no units)   Date Value  03/09/2012 NEGATIVE   HEPATITIS B SURFACE AG (no units)  Date Value  03/09/2012 NEGATIVE   HCV AB (no units)  Date Value  09/13/2006 YES    Lab Results  Component Value Date   HCVGENOTYPE 1a 04/06/2012    Hepatitis C RNA quantitative Latest Ref Rng 07/11/2014 03/09/2012 04/24/2008  HCV Quantitative <15 IU/mL 5599936(H) 54650354(S) 19500000(H)  HCV Quantitative Log <1.18 log 10 6.75(H) 7.02(H) -    AST (U/L)  Date Value  06/25/2014 64*  10/16/2013 112*  09/28/2013 57*   ALT (U/L)  Date Value  06/25/2014 44  10/16/2013 99*  09/28/2013 64*   INR (no units)  Date Value  07/23/2014 3.70  07/17/2014 2.3  12/04/2013 2.10  11/18/2013 2.45*  10/02/2013 2.45*  10/01/2013 2.71*    CrCl: CrCl cannot be calculated (Unknown ideal weight.).  Fibrosis Score:  3.44 as assessed by FIB4   Child-Pugh Score: Prob A  Previous Treatment Regimen: Naive  Assessment: Jacob Joseph is here with his wife for a discussion of the hepatitis C treatment. Jacob Joseph has been approved for Harvoni even though he does not have elastography. He has genotype 1a  and his wife has 1b. Both are co-infected. Hx IVDA. Both have been out of care for about a year and have not been on ART. They have seem to be more committed now. We are not going to start Hep C treatment on them until we get Jacob Joseph approved through support path. I explained the therapy to them and had Jacob Joseph signed the paper work. Outpt Rx will hold Jacob Joseph's Harvoni until we hear back from [facility].   Recommendations:  Submit paper work for Jacob Joseph F/u with Jacob Joseph and Jacob Joseph, Jacob Joseph, [place].D., Jacob Joseph, Jacob Joseph Clinical Infectious [facility] 07/31/2014, 1:38 PM

## 2014-08-07 ENCOUNTER — Ambulatory Visit: Payer: Medicare Other

## 2014-08-07 DIAGNOSIS — F1410 Cocaine abuse, uncomplicated: Secondary | ICD-10-CM

## 2014-08-09 ENCOUNTER — Encounter: Payer: Self-pay | Admitting: Internal Medicine

## 2014-08-09 ENCOUNTER — Ambulatory Visit (INDEPENDENT_AMBULATORY_CARE_PROVIDER_SITE_OTHER): Payer: Medicare Other | Admitting: Internal Medicine

## 2014-08-09 DIAGNOSIS — B20 Human immunodeficiency virus [HIV] disease: Secondary | ICD-10-CM | POA: Diagnosis not present

## 2014-08-09 DIAGNOSIS — Z23 Encounter for immunization: Secondary | ICD-10-CM | POA: Diagnosis not present

## 2014-08-09 DIAGNOSIS — B1710 Acute hepatitis C without hepatic coma: Secondary | ICD-10-CM | POA: Diagnosis not present

## 2014-08-09 MED ORDER — ELVITEG-COBIC-EMTRICIT-TENOFAF 150-150-200-10 MG PO TABS: 1.0000 | ORAL_TABLET | Freq: Every day | ORAL | Status: DC

## 2014-08-09 MED ORDER — DARUNAVIR ETHANOLATE 800 MG PO TABS: 800.0000 mg | ORAL_TABLET | Freq: Every day | ORAL | Status: DC

## 2014-08-09 NOTE — Progress Notes (Signed)
Patient ID: Jacob Joseph, male   DOB: Feb 05, 1956, 59 y.o.   MRN: [id number]          Patient Active Problem List   Diagnosis Date Noted  . Human immunodeficiency virus (HIV) disease 04/27/2006    Priority: High  . Chronic hepatitis C without hepatic coma 04/27/2006    Priority: High  . Polysubstance abuse 04/27/2006    Priority: High  . Tachycardia 11/30/2013  . Diabetes mellitus, type 2 10/19/2013  . Sinus tachycardia 10/19/2013  . Lipodystrophy 10/19/2013  . COPD, moderate 05/31/2013  . Bilateral lower extremity pain 05/08/2013  . Loss of weight 04/24/2013  . DVT, lower extremity 04/18/2013  . Warfarin anticoagulation 04/17/2013  . Complex Lt Renal Cyst 03/22/2013  . Preventive measure 05/30/2012  . History of CVA 03/08/2012  . Normocytic anemia 03/08/2012  . Leukopenia 03/08/2012  . CKD Stage 2 03/08/2012  . ERECTILE DYSFUNCTION 04/25/2008  . INSOMNIA, CHRONIC 06/14/2007  . TOBACCO USER 04/27/2006  . DEPRESSION 04/27/2006  . Essential hypertension 04/27/2006  . DEGENERATIVE JOINT DISEASE 04/27/2006  . Chronic Low Back Pain 04/27/2006    Patient's Medications  New Prescriptions   DARUNAVIR ETHANOLATE (PREZISTA) 800 MG TABLET    Take 1 tablet (800 mg total) by mouth daily with breakfast.   ELVITEGRAVIR-COBICISTAT-EMTRICITABINE-TENOFOVIR (GENVOYA) 150-150-200-10 MG TABS TABLET    Take 1 tablet by mouth daily with breakfast.  Previous Medications   ALBUTEROL (PROVENTIL HFA;VENTOLIN HFA) 108 (90 BASE) MCG/ACT INHALER    Inhale 2 puffs into the lungs every 6 (six) hours as needed for wheezing or shortness of breath.   LEDIPASVIR-SOFOSBUVIR (HARVONI) 90-400 MG TABS    Take 1 tablet by mouth daily with breakfast.   LISINOPRIL (PRINIVIL,ZESTRIL) 10 MG TABLET    Take 1 tablet (10 mg total) by mouth daily.   METOPROLOL TARTRATE (LOPRESSOR) 25 MG TABLET    Take 1 tablet (25 mg total) by mouth 2 (two) times daily.   MICONAZOLE (MICATIN) 2 % CREAM    Apply 1 application  topically 2 (two) times daily. Apply to affected areas   QUETIAPINE FUMARATE (SEROQUEL XR) 150 MG 24 HR TABLET    Take 150 mg by mouth at bedtime.   SPIRIVA HANDIHALER 18 MCG INHALATION CAPSULE    INHALE THE CONTENTS OF 1 CASPSULE USING HANDIHALER EVERY DAY   WARFARIN (COUMADIN) 5 MG TABLET    Take 2.5 mg by mouth daily.   ZOLPIDEM (AMBIEN) 10 MG TABLET    Take 10 mg by mouth at bedtime as needed. For sleep  Modified Medications   No medications on file  Discontinued Medications   ELVITEGRAVIR-COBICISTAT-EMTRICITABINE-TENOFOVIR (GENVOYA) 150-150-200-10 MG TABS TABLET    Take 1 tablet by mouth daily with breakfast.   PREZISTA 800 MG TABLET    TAKE 1 TABLET BY MOUTH EVERY DAY WITH BREAKFAST    Subjective: Jacob Joseph is in for his routine follow-up visit. He continues to smoke cigarettes and has no current plan to quit. He has been able to maintain his sobriety since his last visit. Review of Systems: Constitutional: negative Eyes: negative Ears, nose, mouth, throat, and face: negative Respiratory: negative Cardiovascular: negative Gastrointestinal: negative Genitourinary:negative  Past Medical History  Diagnosis Date  . Hypertension   . HIV (human immunodeficiency virus infection)   . Depression   . Stroke 11/2011    Carotids Doppler negative. Right sided weakness resolved, initially on Plavix for 3 months and then continued with ASA.    . TB lung, latent 1988  Treated  . Calcium oxalate crystals in urine 12/23/2011    Asymptomatic, no hematuria. Advised to take plenty of water.  . Hepatitis C     History  Substance Use Topics  . Smoking status: Current Every Day Smoker -- 0.50 packs/day for 41 years    Types: Cigarettes  . Smokeless tobacco: Never Used     Comment: 1/2 PPD  . Alcohol Use: No    Family History  Problem Relation Age of Onset  . Hypertension Mother   . Hypertension Father   . Cancer Father     Allergies  Allergen Reactions  . Statins Other (See Comments)      Elevated liver enzymes.  . Metformin And Related Diarrhea    Objective: Temp: 98.3 F (36.8 C) (01/21 0911) Temp Source: Oral (01/21 0911) BP: 137/87 mmHg (01/21 0911) Pulse Rate: 73 (01/21 0911) Body mass index is 25.36 kg/(m^2).  General: He is in good spirits Oral: No oropharyngeal lesions Skin: No rash Lungs: Clear Cor: Regular S1 and S2 with no murmur Abdomen: Soft and nontender   Lab Results Lab Results  Component Value Date   WBC 5.4 06/25/2014   HGB 15.0 06/25/2014   HCT 44.2 06/25/2014   MCV 78.4 06/25/2014   PLT 160 06/25/2014    Lab Results  Component Value Date   CREATININE 0.85 06/25/2014   BUN 8 06/25/2014   NA 136 06/25/2014   K 3.8 06/25/2014   CL 105 06/25/2014   CO2 21 06/25/2014    Lab Results  Component Value Date   ALT 44 06/25/2014   AST 64* 06/25/2014   ALKPHOS 106 06/25/2014   BILITOT 0.5 06/25/2014    Lab Results  Component Value Date   CHOL 120 10/16/2013   HDL 38* 10/16/2013   LDLCALC 58 10/16/2013   TRIG 120 10/16/2013   CHOLHDL 3.2 10/16/2013    Lab Results HIV 1 RNA QUANT (copies/mL)  Date Value  06/25/2014 21678*  03/19/2014 67014*  10/16/2013 <20   CD4 T CELL ABS (/uL)  Date Value  06/25/2014 350*  03/19/2014 570  10/16/2013 230*     Assessment: I will start him back on an antiretroviral regimen of Genvoya and Prezista.  He has been approved for Harvoni treatment of his chronic hepatitis C. He will return to see our pharmacist, Jacob Joseph, once his Harvoni arrives to review adherence counseling.  I encouraged him to maintain his sobriety and consider quitting cigarettes  Plan: 1. Start Genvoya and Prezista 2. Start Harvoni soon 3. Repeat lab work one month after starting Jacob Joseph, Jacob Joseph for Jacob Joseph [phone] pager   [phone] cell 08/09/2014, 9:57 AM

## 2014-08-09 NOTE — Progress Notes (Signed)
Patient ID: Koji Niehoff, male   DOB: [birth year], 59 y.o.   MRN: [id number] HPI: Ranvir Renovato is a 59 y.o. male is here with his wife for a f/u of his hep C and HIV.   Lab Results  Component Value Date   HCVGENOTYPE 1a 04/06/2012    Allergies: Allergies  Allergen Reactions  . Statins Other (See Comments)    Elevated liver enzymes.  . Metformin And Related Diarrhea    Vitals: Temp: 98.3 F (36.8 C) (01/21 0911) Temp Source: Oral (01/21 0911) BP: 137/87 mmHg (01/21 0911) Pulse Rate: 73 (01/21 0911)  Past Medical History: Past Medical History  Diagnosis Date  . Hypertension   . HIV (human immunodeficiency virus infection)   . Depression   . Stroke 11/2011    Carotids Doppler negative. Right sided weakness resolved, initially on Plavix for 3 months and then continued with ASA.    . TB lung, latent 1988    Treated  . Calcium oxalate crystals in urine 12/23/2011    Asymptomatic, no hematuria. Advised to take plenty of water.  . Hepatitis C     Social History: History   Social History  . Marital Status: Married    Spouse Name: N/A    Number of Children: N/A  . Years of Education: N/A   Social History Main Topics  . Smoking status: Current Every Day Smoker -- 0.50 packs/day for 41 years    Types: Cigarettes  . Smokeless tobacco: Never Used     Comment: 1/2 PPD  . Alcohol Use: No  . Drug Use: 1.00 per week    Special: "Crack" cocaine, Cocaine, Marijuana     Comment: still using "crack" and marijuana about once a week, trying to stop again  . Sexual Activity:    Partners: Female     Comment: declined condoms   Other Topics Concern  . None   Social History Narrative   Pt lives with wife and grandkid in [place].   Has applied for disability and is pending.   Worked in [place] before about 3 years ago.          Labs: HIV 1 RNA QUANT (copies/mL)  Date Value  06/25/2014 21678*  03/19/2014 78469*  10/16/2013 <20   CD4 T CELL ABS (/uL)  Date Value   06/25/2014 350*  03/19/2014 570  10/16/2013 230*   HEP B S AB (no units)  Date Value  03/09/2012 NEGATIVE   HEPATITIS B SURFACE AG (no units)  Date Value  03/09/2012 NEGATIVE   HCV AB (no units)  Date Value  09/13/2006 YES    Lab Results  Component Value Date   HCVGENOTYPE 1a 04/06/2012    Hepatitis C RNA quantitative Latest Ref Rng 07/11/2014 03/09/2012 04/24/2008  HCV Quantitative <15 IU/mL 5599936(H) 62952841(L) 19500000(H)  HCV Quantitative Log <1.18 log 10 6.75(H) 7.02(H) -    AST (U/L)  Date Value  06/25/2014 64*  10/16/2013 112*  09/28/2013 57*   ALT (U/L)  Date Value  06/25/2014 44  10/16/2013 99*  09/28/2013 64*   INR (no units)  Date Value  07/23/2014 3.70  07/17/2014 2.3  12/04/2013 2.10  11/18/2013 2.45*  10/02/2013 2.45*  10/01/2013 2.71*    CrCl: CrCl cannot be calculated (Patient has no serum creatinine result on file.).  Fibrosis Score:  FIB4 = 3.44  Child-Pugh Score: ~A  Previous Treatment Regimen: Naive  Assessment: 59 yo who has had a lot of issue in the past with compliance with his ART.  We have changed it to Genvoya + DRV due to his resistance history. He has not received them yet because of communication issues between him and the pharmacy. I sorted it out and he should get it soon. His wife is now also approved for Harvoni through support [facility]. They have shipped the meds and it will get here either tomorrow or Monday. I'm going to bring them back next Tues to get them to start at the same time. [name] is waiting on [facility]. One other issue is that he is also on Seroquel which is prescribed by [facility]. Genvoya can boost up the level due to cobicistat. I told him to call [facility] after the appt today and tell them to decrease the dose to 50mg  qday. He agreed to do so after he gets home.   Recommendations: Start Genvoya 1 PO qday Start DRV 800mg  PO qday Start Harvoni next week F/u on decreasing Seroquel to 50mg  PO qday  [place], [place], [place].D., BCPS, AAHIVP Clinical Infectious [facility] for Infect[facility] 08/09/2014, 11:13 AM

## 2014-08-13 ENCOUNTER — Ambulatory Visit: Payer: Medicare Other

## 2014-08-13 ENCOUNTER — Ambulatory Visit (INDEPENDENT_AMBULATORY_CARE_PROVIDER_SITE_OTHER): Payer: Medicare Other | Admitting: Pharmacist

## 2014-08-13 DIAGNOSIS — Z8673 Personal history of transient ischemic attack (TIA), and cerebral infarction without residual deficits: Secondary | ICD-10-CM

## 2014-08-13 DIAGNOSIS — Z7901 Long term (current) use of anticoagulants: Secondary | ICD-10-CM | POA: Diagnosis not present

## 2014-08-13 LAB — POCT INR: INR: 2.7

## 2014-08-13 NOTE — Progress Notes (Signed)
Anti-Coagulation Progress Note  Jacob Joseph is a 59 y.o. male who is currently on an anti-coagulation regimen.    RECENT RESULTS: Recent results are below, the most recent result is correlated with a dose of 22.5 mg. per week: Lab Results  Component Value Date   INR 2.70 08/13/2014   INR 3.70 07/23/2014   INR 2.3 07/17/2014    ANTI-COAG DOSE: Anticoagulation Dose Instructions as of 08/13/2014      Sun Mon Tue Wed Thu Fri Sat   New Dose 2.5 mg 2.5 mg 5 mg 2.5 mg 2.5 mg 5 mg 2.5 mg       ANTICOAG SUMMARY:   ANTICOAG TODAY: Anticoagulation Summary as of 08/13/2014    INR goal 2.0-3.0  Selected INR 2.70 (08/13/2014)  Next INR check   Target end date Indefinite   Indications  History of CVA (Resolved) [Z86.73] Warfarin anticoagulation (Resolved) [Z79.01]      Anticoagulation Episode Summary    [facility]R check location [facility]   Preferred lab    Discontinue date 08/13/2014   Discontinue reason Therapy Completed   Send INR reminders to    Comments       PATIENT INSTRUCTIONS: Patient Instructions  Patient was instructed to discontinue warfarin. Course of therapy completed. Patient was instructed to return to clinic in 4 weeks 09-10-14 for D-Dimer testing.  Patient was instructed to follow-up in the ED/clinic if he had symptoms of recurrent clot (i.e. Swelling, pain in extremities, coughing up blood, chest pain).    FOLLOW-UP Return in 4 weeks (on 09/10/2014) for Follow-up D-Dimer.   [name], PharmD Clinical Pharmacist - Resident Pager: [phone] 1/25/20162:40 PM

## 2014-08-13 NOTE — Patient Instructions (Signed)
Patient was instructed to discontinue warfarin. Course of therapy completed. Patient was instructed to return to clinic in 4 weeks 09-10-14 for D-Dimer testing.  Patient was instructed to follow-up in the ED/clinic if he had symptoms of recurrent clot (i.e. Swelling, pain in extremities, coughing up blood, chest pain).

## 2014-08-14 ENCOUNTER — Ambulatory Visit: Payer: Medicare Other | Admitting: Pharmacist Clinician (PhC)/ Clinical Pharmacy Specialist

## 2014-08-14 DIAGNOSIS — B20 Human immunodeficiency virus [HIV] disease: Secondary | ICD-10-CM

## 2014-08-14 DIAGNOSIS — B1710 Acute hepatitis C without hepatic coma: Secondary | ICD-10-CM

## 2014-08-14 NOTE — Progress Notes (Signed)
Patient ID: Jacob Joseph, male   DOB: January 27, 1956, 59 y.o.   MRN: [id number] HPI: [name] is a 59 y.o. male who is here to pick up his hep C meds and ART  Lab Results  Component Value Date   HCVGENOTYPE 1a 04/06/2012    Allergies: Allergies  Allergen Reactions  . Statins Other (See Comments)    Elevated liver enzymes.  . Metformin And Related Diarrhea    Vitals:    Past Medical History: Past Medical History  Diagnosis Date  . Hypertension   . HIV (human immunodeficiency virus infection)   . Depression   . Stroke 11/2011    Carotids Doppler negative. Right sided weakness resolved, initially on Plavix for 3 months and then continued with ASA.    . TB lung, latent 1988    Treated  . Calcium oxalate crystals in urine 12/23/2011    Asymptomatic, no hematuria. Advised to take plenty of water.  . Hepatitis C     Social History: History   Social History  . Marital Status: Married    Spouse Name: N/A    Number of Children: N/A  . Years of Education: N/A   Social History Main Topics  . Smoking status: Current Every Day Smoker -- 0.50 packs/day for 41 years    Types: Cigarettes  . Smokeless tobacco: Never Used     Comment: 1/2 PPD  . Alcohol Use: No  . Drug Use: 1.00 per week    Special: "Crack" cocaine, Cocaine, Marijuana     Comment: still using "crack" and marijuana about once a week, trying to stop again  . Sexual Activity:    Partners: Female     Comment: declined condoms   Other Topics Concern  . Not on file   Social History Narrative   Pt lives with wife and grandkid in [place].   Has applied for disability and is pending.   Worked in [place] before about 3 years ago.          Labs: HIV 1 RNA QUANT (copies/mL)  Date Value  06/25/2014 21678*  03/19/2014 25956*  10/16/2013 <20   CD4 T CELL ABS (/uL)  Date Value  06/25/2014 350*  03/19/2014 570  10/16/2013 230*   HEP B S AB (no units)  Date Value  03/09/2012 NEGATIVE   HEPATITIS B  SURFACE AG (no units)  Date Value  03/09/2012 NEGATIVE   HCV AB (no units)  Date Value  09/13/2006 YES    Lab Results  Component Value Date   HCVGENOTYPE 1a 04/06/2012    Hepatitis C RNA quantitative Latest Ref Rng 07/11/2014 03/09/2012 04/24/2008  HCV Quantitative <15 IU/mL 5599936(H) 38756433(I) 19500000(H)  HCV Quantitative Log <1.18 log 10 6.75(H) 7.02(H) -    AST (U/L)  Date Value  06/25/2014 64*  10/16/2013 112*  09/28/2013 57*   ALT (U/L)  Date Value  06/25/2014 44  10/16/2013 99*  09/28/2013 64*   INR (no units)  Date Value  08/13/2014 2.70  07/23/2014 3.70  07/17/2014 2.3  11/18/2013 2.45*  10/02/2013 2.45*  10/01/2013 2.71*    CrCl: CrCl cannot be calculated (Patient has no serum creatinine result on file.).  Fibrosis Score:  Child-Pugh Score:  Previous Treatment Regimen: Naive  Assessment: Jacob Joseph is here to pick up his hep C meds and ART. The outpt pharmacy was able to fill both. We wanted to start both him and his wife on Harvoni at the same time to get a better chance of cure and  to lessen the chance of further cross infection of hep C. During our conversation, he stated that do we have any treatment for his herpes. Apparently, [facility] and was treated for gonorrhea and was treated there. They told him that he likely also has herpes due to the sore he has on the genital region. No treatment was offer. Luckily we had some Valtrex here that he can use. Case was d/w Dr. Megan Salon and he is ok with the plan. He is getting 30 tabs of valtrex so this would give him a full duration of his outbreak. Handed him Valtrex, Genvoya, Prezista, and Harvoni.   Recommendations:  Genvoya 1 PO qday Prezista 1 PO qday Harvoni 1 PO qday See back in 3-4 wks for labs and pick up meds  Onnie [place], [place].D., BCPS, AAHIVP Clinical Infectious [facility] for Infectious Disease 08/14/2014, 10:43 AM

## 2014-08-14 NOTE — Progress Notes (Signed)
Indication: Deep venous thrombosis. Duration: Therapy being stopped with d-Dimer testing in 4 weeks with reassessment thereafter. INR: At goal. Agree with Dr. Kristine Royal assessment and plan.

## 2014-08-17 DIAGNOSIS — H2512 Age-related nuclear cataract, left eye: Secondary | ICD-10-CM | POA: Diagnosis not present

## 2014-08-21 ENCOUNTER — Ambulatory Visit: Payer: Medicare Other

## 2014-08-22 ENCOUNTER — Other Ambulatory Visit: Payer: Self-pay | Admitting: Internal Medicine

## 2014-08-22 DIAGNOSIS — I82409 Acute embolism and thrombosis of unspecified deep veins of unspecified lower extremity: Secondary | ICD-10-CM

## 2014-08-23 DIAGNOSIS — F329 Major depressive disorder, single episode, unspecified: Secondary | ICD-10-CM | POA: Diagnosis not present

## 2014-08-28 ENCOUNTER — Telehealth: Payer: Self-pay | Admitting: Internal Medicine

## 2014-08-28 NOTE — Telephone Encounter (Signed)
Call to patient to confirm appointment for 2/10 at 2:45 lmtcb

## 2014-08-29 ENCOUNTER — Encounter: Payer: Self-pay | Admitting: Internal Medicine

## 2014-08-29 ENCOUNTER — Ambulatory Visit (INDEPENDENT_AMBULATORY_CARE_PROVIDER_SITE_OTHER): Payer: Medicare Other | Admitting: Internal Medicine

## 2014-08-29 VITALS — BP 153/93 | HR 81 | Temp 97.6°F | Ht 72.0 in | Wt 194.5 lb

## 2014-08-29 DIAGNOSIS — Z720 Tobacco use: Secondary | ICD-10-CM

## 2014-08-29 DIAGNOSIS — Z299 Encounter for prophylactic measures, unspecified: Secondary | ICD-10-CM

## 2014-08-29 DIAGNOSIS — Z418 Encounter for other procedures for purposes other than remedying health state: Secondary | ICD-10-CM

## 2014-08-29 DIAGNOSIS — A6000 Herpesviral infection of urogenital system, unspecified: Secondary | ICD-10-CM

## 2014-08-29 DIAGNOSIS — I10 Essential (primary) hypertension: Secondary | ICD-10-CM

## 2014-08-29 DIAGNOSIS — E119 Type 2 diabetes mellitus without complications: Secondary | ICD-10-CM

## 2014-08-29 DIAGNOSIS — F17200 Nicotine dependence, unspecified, uncomplicated: Secondary | ICD-10-CM

## 2014-08-29 DIAGNOSIS — J449 Chronic obstructive pulmonary disease, unspecified: Secondary | ICD-10-CM

## 2014-08-29 DIAGNOSIS — F1910 Other psychoactive substance abuse, uncomplicated: Secondary | ICD-10-CM

## 2014-08-29 DIAGNOSIS — H269 Unspecified cataract: Secondary | ICD-10-CM | POA: Insufficient documentation

## 2014-08-29 DIAGNOSIS — B20 Human immunodeficiency virus [HIV] disease: Secondary | ICD-10-CM

## 2014-08-29 DIAGNOSIS — I82409 Acute embolism and thrombosis of unspecified deep veins of unspecified lower extremity: Secondary | ICD-10-CM

## 2014-08-29 LAB — BASIC METABOLIC PANEL WITH GFR
BUN: 7 mg/dL (ref 6–23)
CO2: 23 mEq/L (ref 19–32)
Calcium: 9 mg/dL (ref 8.4–10.5)
Chloride: 105 mEq/L (ref 96–112)
Creat: 0.95 mg/dL (ref 0.50–1.35)
GFR, Est African American: >89 mL/min
GFR, Est Non African American: 88 mL/min
Glucose, Bld: 102 mg/dL — ABNORMAL HIGH (ref 70–99)
Potassium: 4.4 mEq/L (ref 3.5–5.3)
SODIUM: 138 meq/L (ref 135–145)

## 2014-08-29 MED ORDER — ACYCLOVIR 400 MG PO TABS: 400.0000 mg | ORAL_TABLET | Freq: Two times a day (BID) | ORAL | Status: DC

## 2014-08-29 MED ORDER — LISINOPRIL 20 MG PO TABS: 20.0000 mg | ORAL_TABLET | Freq: Every day | ORAL | Status: DC

## 2014-08-29 NOTE — Assessment & Plan Note (Signed)
Patient has been struggling with substance abuse, especially cocaine and alcohol. He states that most recently he has not used these drugs as he has quit.

## 2014-08-29 NOTE — Assessment & Plan Note (Signed)
No evidence of clinical DVT. Off Coumadin. Awaiting a d-dimer on 09/10/2014, to decide on need for further oral anticoagulants.

## 2014-08-29 NOTE — Patient Instructions (Addendum)
General Instructions: Please increased Lisinopril to 20 mg daily  Please take Acyclovir 400 mg daily Please call social worker for personal aid Please come back in 3 months  Please bring your medicines with you each time you come to clinic.  Medicines may include prescription medications, over-the-counter medications, herbal remedies, eye drops, vitamins, or other pills.   Progress Toward Treatment Goals:  Treatment Goal 08/29/2014  Hemoglobin A1C at goal  Blood pressure unchanged  Stop smoking smoking the same amount  Prevent falls improved    Self Care Goals & Plans:  Self Care Goal 07/17/2014  Manage my medications take my medicines as prescribed; bring my medications to every visit; refill my medications on time  Monitor my health -  Eat healthy foods -  Be physically active -  Stop smoking -  Prevent falls -  Meeting treatment goals -    Home Blood Glucose Monitoring 07/17/2014  Check my blood sugar once a day  When to check my blood sugar before breakfast     Care Management & Community Referrals:  Referral 10/19/2013  Referrals made for care management support none needed  Referrals made to community resources none

## 2014-08-29 NOTE — Assessment & Plan Note (Addendum)
Assessment: The patient presents for preoperative evaluation prior to cataract surgery . Specifically he is planned for cataract surgery on 09/17/2014 by [facility], which can be considered a low risk surgery. He has multiple comorbidities that put him at slightly increased cardiac risk, which include, but are not limited to the following: Diabetes Mellitus, hypertension, and moderate COPD based on pulmonary function tests performed in 2014 and active smoking status. EKG from 11/18/2013 revealed left anterior fascicular block but due to lack of current symptoms, this has not been repeated. According to Revised Goldman Cardiac Risk Index, it can be assumed that the patient has 1 independent predictors of major cardiac complications. Therefore, his potential rate of cardiac death, nonfatal MI, or nonfatal cardiac arrest, would be approximated at 1-2%  According to [facility]) Preoperative pulmonary risk index performed due to patient's history of moderate COPD, he scored 3 points which estimates a low risk of 1.6% for pulmonary complication rate.  Plan:      At this time, I would advise discussion of the above mentioned risks between the ophthalmologist and Jacob Joseph with review risks and benefits and ultimate decision to proceed with the planned surgery as is deemed appropriate by the referring provider.  Resting EKG was not performed today because patient does not have known atherosclerotic cardiovascular disease and is currently asymptomatic. The type of surgery planned is considered low risk by the 2007 ACC/AHA guidelines on perioperative cardiovascular evaluation.  Chest xray was not ordered today because the patient already has known history of moderate COPD, which is well managed with albuterol, Advair, and Spiriva inhalers  CMET was ordered today for evaluation of renal and hepatic function.  Results of pending labs and imaging studies (if performed), will be forwarded to  the referring provider.   I will forward my office note to [facility].

## 2014-08-29 NOTE — Assessment & Plan Note (Signed)
Diabetes in remission. Off medications. He is seeing his ophthalmologist is also planning to do cataract surgery this month. We'll continue with close follow-up.

## 2014-08-29 NOTE — Assessment & Plan Note (Addendum)
Symptoms are stable. Continues to smoke. Plan -Continue with Spiriva. -Continue with Advair. -Continue with albuterol -Encouraged to stop smoking.

## 2014-08-29 NOTE — Progress Notes (Signed)
Case discussed with Dr.Kazibwe at the time of the visit.  We reviewed the resident's history and exam and pertinent patient test results.  I agree with the assessment, diagnosis, and plan of care documented in the resident's note.    

## 2014-08-29 NOTE — Assessment & Plan Note (Signed)
Assessment: Most likely diagnosis: Genital herpes in view of appearance of the rash on clinic exam. No other signs or symptoms to suggest the STIs like syphilis. Plan: 1. Labs/imaging: none 2. Therapy: Acyclovir 400 mg twice a day 3. Follow up: Will follow up with infectious disease to determine duration of therapy

## 2014-08-29 NOTE — Assessment & Plan Note (Signed)
Reports compliance with his antiretroviral therapy. Will continue with clinic follow-up at infectious diseases.

## 2014-08-29 NOTE — Progress Notes (Signed)
Patient ID: Jacob Joseph, male   DOB: [birth year], 59 y.o.   MRN: [id number]   Subjective:   HPI: JacobJacob Joseph is a 59 y.o. gentleman with past medical history of HIV/AIDS, hypertension, history of DVT, CVA, chronic back pain, type 2 diabetes, COPD, substance abuse among other health problems, presents for follow-up visit.  Reason(s) for this visit: 1. Genital rash: Jacob Joseph states that he developed a genital rash about one month ago. He presented to the health department and he was prescribed some medication for treatment of genital herpes per patient's report. He does not remember name of the pills that he has been taking over the last 1 month for treatment of this genital herpes. His rash is significantly improved. He is scheduled to see infectious disease within the next few weeks. He denies fevers, chills or any other constitutional symptoms. He denies urinary symptoms. He was told that he needs to stay on treatment for a longer duration. Apparently he has a history of genital herpes but is not on chronic herpes antiretroviral suppression. He requests for prescription of this. He denies dysuria, penile discharge, scrotal pain, or recent sexual encounters.  He is scheduled to have cataract surgery by his ophthalmologist and requests for medical clearance through paperwork that was faxed to our office.  2. Preoperative clearance for cataract surgery - Request by [facility] for preoperative clearance prior to cataract surgery, planned for treatment of cataract. Surgery is scheduled for 08/17/2014 per patient report.   ID - Patient reports recent illness with a genital herpes. He denies fevers, chills, however, he does have chronic intermittent cough and congestion attributable to moderate COPD (GOLD stage 2). No sick contacts.  Functional capacity - denies ability to take care of self. He states that he frequently needs assistance with dressing himself, bathing, and preparing  meals. However, he can use the toilet by himself. Admits to ability to walk up a flight of steps or a hill. Denies ability to do heavy work around the house such as scrubbing floors or lifting or moving heavy furniture. Denies ability to participate in strenuous sports such as swimming, singles tennis, football, basketball, and skiing mostly due to moderate COPD. Cardiovascular status - Patient reports history of stroke, but no heart disease. He has a history of stage II renal insufficiency and diabetes, which are both well controlled.  No history of MI or heart failure. At baseline he has shortness of breath due to moderate COPD but does deny chest pain, palpitations, or tachycardia. He also has a history of cocaine and alcohol abuse. Anesthesia - Patient denies past history of adverse reaction to anesthesia, denies known family history of reaction to anesthesia.  Seizure history - Patient denies prior history of seizure disorder.  Pulmonary function - Patient has history of ongoing tobacco use with 1 pack per day. The patient currently admits to acute pulmonary complaints including, but not limited to shortness of breath, difficulty breathing, cough.  Hematologic status - patient denies prior easy bruising, easy bleeding. No known bleeding disorders. denies have history of anemia, with baseline hemoglobin of 15.0 on 06/25/2014.   Kindly see the A&P for the status of the pt's chronic medical problems.  ROS: Constitutional: Denies fever, chills, diaphoresis, appetite change and fatigue.  Respiratory: Baseline shortness of breath, wheezing and intermittent cough due to COPD. This has not changed recently. Denies chest pain. CVS: No chest pain, palpitations and leg swelling.  GI: No abdominal pain, nausea, vomiting, bloody stools MSK: No  myalgias, back pain, joint swelling, arthralgias  Psych: No depression symptoms. No SI or SA.    Objective:  Physical Exam: Filed Vitals:   08/29/14 1555  BP:  153/93  Pulse: 81  Temp: 97.6 F (36.4 C)  TempSrc: Oral  Height: 6' (1.829 m)  Weight: 194 lb 8 oz (88.225 kg)  SpO2: 100%   General: Well nourished. No acute distress.  HEENT: Normal oral mucosa. MMM.  Lungs: CTA bilaterally. Heart: RRR; no extra sounds or murmurs  Abdomen: Non-distended, normal bowel sounds, soft, nontender; no hepatosplenomegaly. Genital exam: Mild papular rash over the glans penis. Nontender. No overt signs of infection or inflammation. No scrotal tenderness or swelling. No urethral discharge. Extremities: No pedal edema. No joint swelling or tenderness. Neurologic: Normal EOM,  Alert and oriented x3. No obvious neurologic/cranial nerve deficits.  Assessment & Plan:  Discussed case with my attending in the clinic, Dr. Ellwood Dense. See problem based charting.

## 2014-08-29 NOTE — Assessment & Plan Note (Signed)
Not ready to quit. Encouraged to quit smoking.

## 2014-09-04 ENCOUNTER — Other Ambulatory Visit: Payer: Self-pay | Admitting: *Deleted

## 2014-09-04 DIAGNOSIS — J449 Chronic obstructive pulmonary disease, unspecified: Secondary | ICD-10-CM

## 2014-09-04 MED ORDER — FLUTICASONE-SALMETEROL 100-50 MCG/DOSE IN AEPB: 1.0000 | INHALATION_SPRAY | Freq: Two times a day (BID) | RESPIRATORY_TRACT | Status: DC

## 2014-09-05 ENCOUNTER — Telehealth: Payer: Self-pay | Admitting: *Deleted

## 2014-09-05 NOTE — Telephone Encounter (Signed)
Pt called and left a message on triage line concerning his lisinopril, he threatened twice to come to the clinic and "be all up in there

## 2014-09-10 ENCOUNTER — Other Ambulatory Visit: Payer: Self-pay | Admitting: Pharmacist Clinician (PhC)/ Clinical Pharmacy Specialist

## 2014-09-10 ENCOUNTER — Other Ambulatory Visit: Payer: Self-pay | Admitting: Infectious Diseases

## 2014-09-10 ENCOUNTER — Encounter: Payer: Self-pay | Admitting: Infectious Diseases

## 2014-09-10 ENCOUNTER — Ambulatory Visit: Payer: Medicare Other | Admitting: Pharmacist Clinician (PhC)/ Clinical Pharmacy Specialist

## 2014-09-10 ENCOUNTER — Ambulatory Visit (INDEPENDENT_AMBULATORY_CARE_PROVIDER_SITE_OTHER): Payer: Medicare Other | Admitting: Infectious Diseases

## 2014-09-10 ENCOUNTER — Other Ambulatory Visit (HOSPITAL_COMMUNITY)
Admission: RE | Admit: 2014-09-10 | Discharge: 2014-09-10 | Disposition: A | Discharge status: Home / Self Care | Payer: Medicare Other | Source: Ambulatory Visit | Attending: Infectious Diseases | Admitting: Infectious Diseases

## 2014-09-10 ENCOUNTER — Other Ambulatory Visit: Payer: Medicare Other

## 2014-09-10 VITALS — BP 134/78 | HR 93 | Temp 98.8°F | Wt 178.0 lb

## 2014-09-10 DIAGNOSIS — Z113 Encounter for screening for infections with a predominantly sexual mode of transmission: Secondary | ICD-10-CM | POA: Insufficient documentation

## 2014-09-10 DIAGNOSIS — I82409 Acute embolism and thrombosis of unspecified deep veins of unspecified lower extremity: Secondary | ICD-10-CM | POA: Diagnosis not present

## 2014-09-10 DIAGNOSIS — B20 Human immunodeficiency virus [HIV] disease: Secondary | ICD-10-CM

## 2014-09-10 DIAGNOSIS — Z79899 Other long term (current) drug therapy: Secondary | ICD-10-CM | POA: Diagnosis not present

## 2014-09-10 DIAGNOSIS — Z23 Encounter for immunization: Secondary | ICD-10-CM | POA: Diagnosis not present

## 2014-09-10 DIAGNOSIS — B182 Chronic viral hepatitis C: Secondary | ICD-10-CM

## 2014-09-10 DIAGNOSIS — B1710 Acute hepatitis C without hepatic coma: Secondary | ICD-10-CM | POA: Diagnosis not present

## 2014-09-10 DIAGNOSIS — A6000 Herpesviral infection of urogenital system, unspecified: Secondary | ICD-10-CM

## 2014-09-10 MED ORDER — DARUNAVIR ETHANOLATE 800 MG PO TABS: 800.0000 mg | ORAL_TABLET | Freq: Every day | ORAL | Status: DC

## 2014-09-10 MED ORDER — VALACYCLOVIR HCL 500 MG PO TABS: 500.0000 mg | ORAL_TABLET | Freq: Every day | ORAL | Status: DC

## 2014-09-10 MED ORDER — ELVITEG-COBIC-EMTRICIT-TENOFAF 150-150-200-10 MG PO TABS: 1.0000 | ORAL_TABLET | Freq: Every day | ORAL | Status: DC

## 2014-09-10 NOTE — Progress Notes (Signed)
   Subjective:    Patient ID: Jacob Joseph, male    DOB: January 27, 1956, 59 y.o.   MRN: [id number]  HPI 59 yo M with hx of HIV+ on genvoya/DRV. He also has a hx of Hep C, started Harvoni rx 30 days ago.  Comes to clinic today after contracting HSV last month. He was treated with VTX for this. This has resolved. Today is in interested in suppressive tx for HSV.  States ART is going well, denies sfx.   HIV 1 RNA QUANT (copies/mL)  Date Value  06/25/2014 21678*  03/19/2014 35009*  10/16/2013 <20   CD4 T CELL ABS (/uL)  Date Value  06/25/2014 350*  03/19/2014 570  10/16/2013 230*   Review of Systems  Constitutional: Negative for unexpected weight change.  Gastrointestinal: Negative for diarrhea and constipation.  Genitourinary: Negative for hematuria and difficulty urinating.       Objective:   Physical Exam  Constitutional: He appears well-developed and well-nourished.  HENT:  Mouth/Throat: No oropharyngeal exudate.  Eyes: EOM are normal. Pupils are equal, round, and reactive to light.  Neck: Neck supple.  Cardiovascular: Normal rate, regular rhythm and normal heart sounds.   Lymphadenopathy:    He has no cervical adenopathy.      Assessment & Plan:

## 2014-09-10 NOTE — Assessment & Plan Note (Signed)
Will start him on suppressive valtrex.  Discussed drug interactions with pharm.

## 2014-09-10 NOTE — Addendum Note (Signed)
Addended byMarlis Edelson on: 09/10/2014 02:42 PM   Modules accepted: Orders

## 2014-09-10 NOTE — Assessment & Plan Note (Addendum)
Will recheck his CD4 and HIV RNA. Start hep B series # 2.  Using condoms.  Wife is HIV+ rtc with Dr Megan Salon as regularly scheduled.

## 2014-09-10 NOTE — Progress Notes (Signed)
Patient ID: Jacob Joseph, male   DOB: Oct 04, 1955, 59 y.o.   MRN: [id number] HPI: Jacob Joseph is a 59 y.o. male is here for his f/u of his HIV/Hep C  Allergies: Allergies  Allergen Reactions  . Statins Other (See Comments)    Elevated liver enzymes.  . Metformin And Related Diarrhea    Vitals: Temp: 98.8 F (37.1 C) (02/22 1346) Temp Source: Oral (02/22 1346) BP: 134/78 mmHg (02/22 1346) Pulse Rate: 93 (02/22 1346)  Past Medical History: Past Medical History  Diagnosis Date  . Hypertension   . HIV (human immunodeficiency virus infection)   . Depression   . Stroke 11/2011    Carotids Doppler negative. Right sided weakness resolved, initially on Plavix for 3 months and then continued with ASA.    . TB lung, latent 1988    Treated  . Calcium oxalate crystals in urine 12/23/2011    Asymptomatic, no hematuria. Advised to take plenty of water.  . Hepatitis C     Social History: History   Social History  . Marital Status: Married    Spouse Name: N/A  . Number of Children: N/A  . Years of Education: N/A   Social History Main Topics  . Smoking status: Current Every Day Smoker -- 0.50 packs/day for 41 years    Types: Cigarettes  . Smokeless tobacco: Never Used     Comment: 1/2 PPD  . Alcohol Use: No  . Drug Use: 1.00 per week    Special: "Crack" cocaine, Cocaine, Marijuana     Comment: still using "crack" and marijuana about once a week, trying to stop again  . Sexual Activity:    Partners: Female     Comment: declined condoms   Other Topics Concern  . Not on file   Social History Narrative   Pt lives with wife and grandkid in [place].   Has applied for disability and is pending.   Worked in [place] before about 3 years ago.          Previous Regimen:   Current Regimen: DRV + Genvoya  Labs: HIV 1 RNA QUANT (copies/mL)  Date Value  06/25/2014 21678*  03/19/2014 95284*  10/16/2013 <20   CD4 T CELL ABS (/uL)  Date Value  06/25/2014 350*   03/19/2014 570  10/16/2013 230*   HEP B S AB (no units)  Date Value  03/09/2012 NEGATIVE   HEPATITIS B SURFACE AG (no units)  Date Value  03/09/2012 NEGATIVE   HCV AB (no units)  Date Value  09/13/2006 YES    CrCl: Estimated Creatinine Clearance: 94.2 mL/min (by C-G formula based on Cr of 0.95).  Lipids:    Component Value Date/Time   CHOL 120 10/16/2013 1514   TRIG 120 10/16/2013 1514   HDL 38* 10/16/2013 1514   CHOLHDL 3.2 10/16/2013 1514   VLDL 24 10/16/2013 1514   LDLCALC 58 10/16/2013 1514    Assessment: He is on his second month of his Harvoni and ART now. Giving him the meds at the visit today along with his blood pressure medication. He said that he has gotten his 2nd mo supply of DRV + Genvoya from [facility]. I told him that we'd prefer that he gets it at [name] in the future so that we can keep track of it. He is going to come back to see me on 3/17 to pick up his last mo supply. Overall, both of them seem to be very motivated to finish their Hep  C meds. Labs are being done today for both Hep C and HIV.   Recommendations: Cont Harvoni 1 PO qday Cont DRV + Genvoya See back in 1 month  Wilfred Lacy, PharmD [facility] 09/10/2014, 2:56 PM

## 2014-09-10 NOTE — Addendum Note (Signed)
Addended by: Myrtis Hopping A on: 09/10/2014 04:09 PM   Modules accepted: Orders

## 2014-09-10 NOTE — Assessment & Plan Note (Signed)
Pt now off anticoagulants per pt. He will get his D-Dimer today, f/u with Dr Elie Confer.

## 2014-09-10 NOTE — Assessment & Plan Note (Signed)
Will check his Hep C VL today.

## 2014-09-11 ENCOUNTER — Other Ambulatory Visit: Payer: Self-pay | Admitting: Internal Medicine

## 2014-09-11 ENCOUNTER — Other Ambulatory Visit: Payer: Self-pay | Admitting: Pharmacist Clinician (PhC)/ Clinical Pharmacy Specialist

## 2014-09-11 DIAGNOSIS — A6000 Herpesviral infection of urogenital system, unspecified: Secondary | ICD-10-CM

## 2014-09-11 DIAGNOSIS — J449 Chronic obstructive pulmonary disease, unspecified: Secondary | ICD-10-CM

## 2014-09-11 LAB — CBC
HCT: 43.1 % (ref 39.0–52.0)
HEMOGLOBIN: 14.4 g/dL (ref 13.0–17.0)
MCH: 27.7 pg (ref 26.0–34.0)
MCHC: 33.4 g/dL (ref 30.0–36.0)
MCV: 83 fL (ref 78.0–100.0)
MPV: 11.5 fL (ref 8.6–12.4)
Platelets: 204 10*3/uL (ref 150–400)
RBC: 5.19 MIL/uL (ref 4.22–5.81)
RDW: 15.4 % (ref 11.5–15.5)
WBC: 6.2 10*3/uL (ref 4.0–10.5)

## 2014-09-11 LAB — LIPID PANEL
Cholesterol: 168 mg/dL (ref 0–200)
HDL: 40 mg/dL (ref >=40)
LDL Cholesterol: 89 mg/dL (ref 0–99)
TRIGLYCERIDES: 194 mg/dL — AB (ref <150)
Total CHOL/HDL Ratio: 4.2 Ratio
VLDL: 39 mg/dL (ref 0–40)

## 2014-09-11 LAB — HIV-1 RNA QUANT-NO REFLEX-BLD
HIV 1 RNA QUANT: 51 {copies}/mL — AB (ref <20)
HIV-1 RNA QUANT, LOG: 1.71 {Log} — AB (ref <1.30)

## 2014-09-11 LAB — RPR

## 2014-09-11 LAB — COMPREHENSIVE METABOLIC PANEL
ALBUMIN: 3.4 g/dL — AB (ref 3.5–5.2)
ALT: 12 U/L (ref 0–53)
AST: 23 U/L (ref 0–37)
Alkaline Phosphatase: 85 U/L (ref 39–117)
BUN: 9 mg/dL (ref 6–23)
CALCIUM: 9.2 mg/dL (ref 8.4–10.5)
CO2: 20 meq/L (ref 19–32)
CREATININE: 0.94 mg/dL (ref 0.50–1.35)
Chloride: 103 mEq/L (ref 96–112)
GLUCOSE: 117 mg/dL — AB (ref 70–99)
POTASSIUM: 4.4 meq/L (ref 3.5–5.3)
SODIUM: 135 meq/L (ref 135–145)
TOTAL PROTEIN: 8.4 g/dL — AB (ref 6.0–8.3)
Total Bilirubin: 0.3 mg/dL (ref 0.2–1.2)

## 2014-09-11 LAB — URINE CYTOLOGY ANCILLARY ONLY
CHLAMYDIA, DNA PROBE: NEGATIVE
NEISSERIA GONORRHEA: NEGATIVE

## 2014-09-11 LAB — T-HELPER CELL (CD4) - (RCID CLINIC ONLY)
CD4 % Helper T Cell: 18 % — ABNORMAL LOW (ref 33–55)
CD4 T Cell Abs: 450 /uL (ref 400–2700)

## 2014-09-11 LAB — D-DIMER, QUANTITATIVE (NOT AT ARMC): D DIMER QUANT: 0.39 ug{FEU}/mL (ref 0.00–0.48)

## 2014-09-11 MED ORDER — MOMETASONE FURO-FORMOTEROL FUM 100-5 MCG/ACT IN AERO: 2.0000 | INHALATION_SPRAY | Freq: Two times a day (BID) | RESPIRATORY_TRACT | Status: DC

## 2014-09-11 MED ORDER — VALACYCLOVIR HCL 500 MG PO TABS: 500.0000 mg | ORAL_TABLET | Freq: Two times a day (BID) | ORAL | Status: DC

## 2014-09-11 MED ORDER — VALACYCLOVIR HCL 500 MG PO TABS: 500.0000 mg | ORAL_TABLET | Freq: Every day | ORAL | Status: DC

## 2014-09-12 LAB — HEPATITIS C RNA QUANTITATIVE: HCV Quantitative Log: <1.18 {Log} (ref <1.18)

## 2014-09-17 DIAGNOSIS — H2512 Age-related nuclear cataract, left eye: Secondary | ICD-10-CM | POA: Diagnosis not present

## 2014-09-20 ENCOUNTER — Telehealth: Payer: Self-pay | Admitting: Licensed Clinical Social Worker

## 2014-09-20 ENCOUNTER — Encounter: Payer: Self-pay | Admitting: Pharmacist

## 2014-09-20 NOTE — Progress Notes (Signed)
Patient ID: Jacob Joseph, male   DOB: 1956-06-30, 59 y.o.   MRN: [id number]  Spoke to patient and his wife about their Hep C medication Harvoni several days ago.  According to his wife, Jacob Joseph, she lost her bottle of Harvoni.  Called and received her last bottle of Harvoni from [employer] [facility].  Her bottle came in today and her husband, Jacob Joseph, came to pick it up.  Instructed Jacob Joseph to quit sharing his medication with Jacob Joseph.  Also called Jacob Joseph and told her not to take Jacob Joseph's medication anymore (they were sharing) and to finish this last bottle for herself.  Instructed Jacob Joseph to finish his current bottle and come next week to pick up his final bottle to complete treatment.  Jacob Joseph L. Jacob Joseph, [place] for Infectious Diseases Pager: [phone] 09/20/2014 3:48 PM

## 2014-09-20 NOTE — Telephone Encounter (Signed)
Mr. Oberholzer placed call to CSW requesting PCS Request for assessment.  Mr. Harnish states he had PCS through [facility] but his services were discontinued several months ago.  Pt had appointment with his PCP which he states this was discussed, will initiate form and forward to PCP.

## 2014-09-24 ENCOUNTER — Telehealth: Payer: Self-pay | Admitting: Internal Medicine

## 2014-09-24 NOTE — Telephone Encounter (Signed)
Call to patient to confirm appointment for 09/25/14 at 10:45 lmtcb

## 2014-09-25 ENCOUNTER — Encounter: Payer: Self-pay | Admitting: Internal Medicine

## 2014-09-25 ENCOUNTER — Ambulatory Visit (INDEPENDENT_AMBULATORY_CARE_PROVIDER_SITE_OTHER): Payer: Medicare Other | Admitting: Internal Medicine

## 2014-09-25 VITALS — BP 130/78 | HR 73 | Temp 98.0°F | Ht 72.0 in | Wt 198.6 lb

## 2014-09-25 DIAGNOSIS — H269 Unspecified cataract: Secondary | ICD-10-CM | POA: Diagnosis not present

## 2014-09-25 DIAGNOSIS — I10 Essential (primary) hypertension: Secondary | ICD-10-CM | POA: Diagnosis not present

## 2014-09-25 DIAGNOSIS — B351 Tinea unguium: Secondary | ICD-10-CM | POA: Diagnosis not present

## 2014-09-25 NOTE — Patient Instructions (Signed)
General Instructions: -Your blood pressure is well controlled today. Continue taking your blood pressure medication.  -Follow up with the Infectious Disease doctors next week and ask about treatment for the toe nail fungus.  -Follow up with your eye doctor tomorrow.  -Follow up with [place] in 3 months.    Please bring your medicines with you each time you come to clinic.  Medicines may include prescription medications, over-the-counter medications, herbal remedies, eye drops, vitamins, or other pills.   Progress Toward Treatment Goals:  Treatment Goal 08/29/2014  Hemoglobin A1C at goal  Blood pressure unchanged  Stop smoking smoking the same amount  Prevent falls improved    Self Care Goals & Plans:  Self Care Goal 09/25/2014  Manage my medications take my medicines as prescribed; bring my medications to every visit; refill my medications on time; follow the sick day instructions if I am sick  Monitor my health keep track of my blood pressure; check my feet daily  Eat healthy foods eat more vegetables; eat fruit for snacks and desserts; eat baked foods instead of fried foods; eat smaller portions  Be physically active find an activity I enjoy  Stop smoking -  Prevent falls -  Meeting treatment goals -    Home Blood Glucose Monitoring 07/17/2014  Check my blood sugar once a day  When to check my blood sugar before breakfast     Care Management & Community Referrals:  Referral 10/19/2013  Referrals made for care management support none needed  Referrals made to community resources none

## 2014-09-26 ENCOUNTER — Encounter: Payer: Self-pay | Admitting: Pharmacist Clinician (PhC)/ Clinical Pharmacy Specialist

## 2014-09-26 DIAGNOSIS — B351 Tinea unguium: Secondary | ICD-10-CM | POA: Insufficient documentation

## 2014-09-26 NOTE — Assessment & Plan Note (Signed)
BP Readings from Last 3 Encounters:  09/25/14 130/78  09/10/14 134/78  08/29/14 153/93    Lab Results  Component Value Date   NA 135 09/10/2014   K 4.4 09/10/2014   CREATININE 0.94 09/10/2014    Assessment: Blood pressure control:  controlled Progress toward BP goal:   at goal Comments: on lisinopril 20mg  daily, metoprolol 25mg  BID Plan: Medications:  continue current medications Educational resources provided: brochure, handout, video Self management tools provided:   Other plans: Follow up in 3 months.

## 2014-09-26 NOTE — Progress Notes (Signed)
   Subjective:    Patient ID: Jacob Joseph, male    DOB: [birth year], 59 y.o.   MRN: [id number]  HPI Jacob Joseph is a 59 yr old man with PMH of HIV, DM2, HTN, presenting for evaluation of bilateral toe nail fungus. He has had toe nail fungus for years and is interested in starting treatment. Denies ingrown toe nail or toe nail pain.  He is s/p bilateral cataract surgery and reports healing well from the surgery with improvement of his eye site.    Review of Systems  Constitutional: Negative for fever, chills, diaphoresis, activity change, appetite change and fatigue.  Eyes: Negative for photophobia, pain, discharge, redness, itching and visual disturbance.  Respiratory: Negative for cough and shortness of breath.   Gastrointestinal: Negative for abdominal pain.  Genitourinary: Negative for dysuria.  Skin: Negative for rash.  Neurological: Negative for dizziness and light-headedness.  Psychiatric/Behavioral: Negative for agitation.       Objective:   Physical Exam  Constitutional: He is oriented to person, place, and time. He appears well-developed and well-nourished. No distress.  Eyes: Conjunctivae are normal. Right eye exhibits no discharge. Left eye exhibits no discharge. No scleral icterus.  Cardiovascular: Normal rate.   Pulmonary/Chest: Effort normal. No respiratory distress.  Musculoskeletal: He exhibits no edema.  Neurological: He is alert and oriented to person, place, and time. Coordination normal.  Skin: Skin is warm and dry. He is not diaphoretic.  Bilateral toe nail fungus with deformity with no toe erythema/edema.   Psychiatric: He has a normal mood and affect.  Nursing note and vitals reviewed.         Assessment & Plan:

## 2014-09-26 NOTE — Assessment & Plan Note (Signed)
Has had nail fungus for years with deformity of toe nails, no ingrown toe nail.  Pt to follow up with ID for treatment options. He has appointment with [facility]-Pharmacist next week and will discuss medication treatment options that are safe with his HIV treatment.

## 2014-09-26 NOTE — Assessment & Plan Note (Signed)
Healing well form his bilateral cataract surgery. He could not remember the name of his Ophthalmologist or the date of his surgery.  He has follow up appointment with his eye doctor this week.

## 2014-09-26 NOTE — Progress Notes (Signed)
Patient ID: Jacob Joseph, male   DOB: 12-Mar-1956, 59 y.o.   MRN: [id number]  Pt stopped by to pick up his meds. I specifically told him to take his supply and not to share with [name] since she has her supply now. We are going to Check their labs at the end of this month to confirm undetectable VL. This means that both will get around 10-11 wks of treatment.   Onnie Boer, PharmD Pager: [phone] 09/26/2014 2:37 PM

## 2014-09-28 NOTE — Progress Notes (Signed)
Internal Medicine Clinic Attending Date of visit: 09/25/2014  Case discussed with Dr. Hayes Ludwig soon after the resident saw the patient on the day of the visit.  We reviewed the resident's history and exam and pertinent patient test results.  I agree with the assessment, diagnosis, and plan of care documented in the resident's note.

## 2014-10-02 NOTE — Telephone Encounter (Signed)
Signed PCS request faxed to [employer].

## 2014-10-04 ENCOUNTER — Ambulatory Visit: Payer: Medicare Other

## 2014-10-04 ENCOUNTER — Other Ambulatory Visit (INDEPENDENT_AMBULATORY_CARE_PROVIDER_SITE_OTHER): Payer: Medicare Other

## 2014-10-04 DIAGNOSIS — B20 Human immunodeficiency virus [HIV] disease: Secondary | ICD-10-CM

## 2014-10-04 DIAGNOSIS — Z23 Encounter for immunization: Secondary | ICD-10-CM | POA: Diagnosis present

## 2014-10-04 DIAGNOSIS — B1710 Acute hepatitis C without hepatic coma: Secondary | ICD-10-CM | POA: Diagnosis not present

## 2014-10-06 LAB — HIV-1 RNA QUANT-NO REFLEX-BLD: HIV 1 RNA Quant: <20 copies/mL (ref <20)

## 2014-10-23 ENCOUNTER — Other Ambulatory Visit: Payer: Self-pay | Admitting: Internal Medicine

## 2014-10-25 DIAGNOSIS — F329 Major depressive disorder, single episode, unspecified: Secondary | ICD-10-CM | POA: Diagnosis not present

## 2014-10-29 ENCOUNTER — Telehealth: Payer: Self-pay | Admitting: Pharmacist

## 2014-10-29 NOTE — Telephone Encounter (Signed)
Marley called the [facility] Pharmacist phone today to request an appointment with Dr. Megan Salon.  He is finishing Harvoni this week.  I was unable to identify openings on Dr. Hale Bogus schedule in the next few weeks and I have sent a message to Dr. Hale Bogus RN to contact Cecilie Lowers with an appointment.  He will need a Hepatitis C viral load ~1 week prior to his appointment.

## 2014-10-31 ENCOUNTER — Ambulatory Visit (INDEPENDENT_AMBULATORY_CARE_PROVIDER_SITE_OTHER): Payer: Medicare Other | Admitting: Internal Medicine

## 2014-10-31 ENCOUNTER — Encounter: Payer: Self-pay | Admitting: Internal Medicine

## 2014-10-31 VITALS — BP 141/88 | HR 75 | Temp 98.3°F | Ht 72.0 in | Wt 209.8 lb

## 2014-10-31 DIAGNOSIS — E119 Type 2 diabetes mellitus without complications: Secondary | ICD-10-CM | POA: Diagnosis not present

## 2014-10-31 DIAGNOSIS — B351 Tinea unguium: Secondary | ICD-10-CM | POA: Diagnosis not present

## 2014-10-31 DIAGNOSIS — F331 Major depressive disorder, recurrent, moderate: Secondary | ICD-10-CM

## 2014-10-31 DIAGNOSIS — I10 Essential (primary) hypertension: Secondary | ICD-10-CM

## 2014-10-31 DIAGNOSIS — F339 Major depressive disorder, recurrent, unspecified: Secondary | ICD-10-CM | POA: Diagnosis not present

## 2014-10-31 DIAGNOSIS — F17210 Nicotine dependence, cigarettes, uncomplicated: Secondary | ICD-10-CM | POA: Diagnosis not present

## 2014-10-31 DIAGNOSIS — J449 Chronic obstructive pulmonary disease, unspecified: Secondary | ICD-10-CM | POA: Diagnosis not present

## 2014-10-31 DIAGNOSIS — B182 Chronic viral hepatitis C: Secondary | ICD-10-CM | POA: Diagnosis not present

## 2014-10-31 DIAGNOSIS — Z21 Asymptomatic human immunodeficiency virus [HIV] infection status: Secondary | ICD-10-CM

## 2014-10-31 DIAGNOSIS — B20 Human immunodeficiency virus [HIV] disease: Secondary | ICD-10-CM

## 2014-10-31 LAB — COMPLETE METABOLIC PANEL WITH GFR
ALT: 14 U/L (ref 0–53)
AST: 19 U/L (ref 0–37)
Albumin: 3.8 g/dL (ref 3.5–5.2)
Alkaline Phosphatase: 77 U/L (ref 39–117)
BUN: 10 mg/dL (ref 6–23)
CALCIUM: 9.5 mg/dL (ref 8.4–10.5)
CHLORIDE: 105 meq/L (ref 96–112)
CO2: 20 mEq/L (ref 19–32)
Creat: 1.06 mg/dL (ref 0.50–1.35)
GFR, EST NON AFRICAN AMERICAN: 78 mL/min
Glucose, Bld: 105 mg/dL — ABNORMAL HIGH (ref 70–99)
POTASSIUM: 4.2 meq/L (ref 3.5–5.3)
Sodium: 136 mEq/L (ref 135–145)
Total Bilirubin: 0.3 mg/dL (ref 0.2–1.2)
Total Protein: 8.3 g/dL (ref 6.0–8.3)

## 2014-10-31 LAB — GLUCOSE, CAPILLARY: Glucose-Capillary: 130 mg/dL — ABNORMAL HIGH (ref 70–99)

## 2014-10-31 LAB — POCT GLYCOSYLATED HEMOGLOBIN (HGB A1C): HEMOGLOBIN A1C: 6.6

## 2014-10-31 MED ORDER — TERBINAFINE HCL 250 MG PO TABS: 250.0000 mg | ORAL_TABLET | Freq: Every day | ORAL | Status: DC

## 2014-10-31 MED ORDER — GLIPIZIDE 5 MG PO TABS: 5.0000 mg | ORAL_TABLET | Freq: Every day | ORAL | Status: DC

## 2014-10-31 NOTE — Assessment & Plan Note (Signed)
BP Readings from Last 3 Encounters:  10/31/14 141/88  09/25/14 130/78  09/10/14 134/78    Lab Results  Component Value Date   NA 135 09/10/2014   K 4.4 09/10/2014   CREATININE 0.94 09/10/2014    Assessment: Blood pressure control: mildly elevated Progress toward BP goal:  unchanged Comments: Compliant with treatment  Plan: Medications:  Lisinopril 20 mg daily and Lopressor 25 mg twice a day. Continue Educational resources provided:   Self management tools provided:   Other plans: Routine follow-up

## 2014-10-31 NOTE — Patient Instructions (Signed)
General Instructions: Please start taking Glipizide 5 mg daily  Please start taking terbinafine for 6 weeks  I will send you to a foot doctor Please see our pharmacist to sort out your medications  Please come back to see me in 3 months   Please bring your medicines with you each time you come to clinic.  Medicines may include prescription medications, over-the-counter medications, herbal remedies, eye drops, vitamins, or other pills.   Progress Toward Treatment Goals:  Treatment Goal 10/31/2014  Hemoglobin A1C deteriorated  Blood pressure unchanged  Stop smoking smoking the same amount  Prevent falls -    Self Care Goals & Plans:  Self Care Goal 10/31/2014  Manage my medications take my medicines as prescribed; bring my medications to every visit; refill my medications on time  Monitor my health keep track of my weight  Eat healthy foods drink diet soda or water instead of juice or soda; eat more vegetables  Be physically active find an activity I enjoy  Stop smoking -  Prevent falls -  Meeting treatment goals -    Home Blood Glucose Monitoring 10/31/2014  Check my blood sugar once a day  When to check my blood sugar before breakfast     Care Management & Community Referrals:  Referral 10/31/2014  Referrals made for care management support none needed  Referrals made to community resources -

## 2014-10-31 NOTE — Assessment & Plan Note (Signed)
Symptoms are stable. Continue with current medications. Stop smoking.

## 2014-10-31 NOTE — Assessment & Plan Note (Addendum)
Completed treatment today!  Plan  Will follow up with ID - has appt on 11/27/2014.

## 2014-10-31 NOTE — Assessment & Plan Note (Signed)
Previously, Jacob Joseph has been on and off medications for DM type 2. It appears his A1c fluctuates with his weight. On the other hand, we want to avoid excessive weight loss in the setting of HIV infection. A1c 6.6% today, which is in diabetic range.  Plan Start glipizide 5 mg once daily  Discussed symptoms of hypoglycemia and how to treat it at home. Patient does not have glucose meter currently, and will work on this to get him one

## 2014-10-31 NOTE — Assessment & Plan Note (Signed)
He is compliant with his current regimen including Genvoya and Prezista.  Plan  Follow up with ID

## 2014-10-31 NOTE — Progress Notes (Signed)
Patient ID: Jacob Joseph, male   DOB: 1956/03/20, 59 y.o.   MRN: [id number]   Subjective:   HPI: [name] is a 59 y.o. African-American gentleman with past medical history of depression, HIV infection, COPD, DM 2, polysubstance abuse, chronic hep C, hypertension, and cigarette smoking among other health problems, presents for routine follow-up visit.  Kindly see the A&P for the status of the pt's chronic medical problems.  ROS: Constitutional: Denies fever, chills, diaphoresis, appetite change and fatigue.  Respiratory: Denies SOB, DOE, cough, chest tightness, and wheezing. Denies chest pain. CVS: No chest pain, palpitations and leg swelling.  GI: No abdominal pain, nausea, vomiting, bloody stools GU: No dysuria, frequency, hematuria, or flank pain.  MSK: No myalgias, back pain, joint swelling, arthralgias. He is bothered by fungal infection involving both feet and hand nails.  Psych: Symptoms are well-controlled with his current regimen. No SI or SA.    Objective:  Physical Exam: Filed Vitals:   10/31/14 1512  BP: 141/88  Pulse: 75  Temp: 98.3 F (36.8 C)  TempSrc: Oral  Height: 6' (1.829 m)  Weight: 209 lb 12.8 oz (95.165 kg)  SpO2: 100%   General: Well nourished. No acute distress. Has gained some weight since I last saw him. HEENT: Normal oral mucosa. MMM.  Lungs: CTA bilaterally. No wheezing Heart: RRR; no extra sounds or murmurs  Abdomen: Non-distended, normal bowel sounds, soft, nontender; no hepatosplenomegaly  Extremities: No pedal edema. No joint swelling or tenderness. Evidence of nail fungal infection involving both feet and hands. Neurologic: Normal EOM,  Alert and oriented x3. No obvious neurologic/cranial nerve deficits.  Assessment & Plan:  Discussed case with my attending in the clinic, Dr. Ellwood Dense See problem based charting.

## 2014-10-31 NOTE — Assessment & Plan Note (Signed)
Symptoms are well controlled with his current treatment. His medications will need to be reviewed carefully by our clinic Pharmacist due to mention of Citalopram, and Seroquel. I am not sure when he started Seroquel. It also appears he gets his medications from different pharmarcies and this will need to be sorted out to avoid interactions and side effects. He did not bring his pill bottles today and he is unsure about his meds.  Plan Referral to Pharmacist for med reconciliation  Continue with Citalopram  Will follow up during his next visit to further clarify his medications.

## 2014-10-31 NOTE — Progress Notes (Signed)
Case discussed with Dr. Kazibwe soon after the resident saw the patient.  We reviewed the resident's history and exam and pertinent patient test results.  I agree with the assessment, diagnosis, and plan of care documented in the resident's note. 

## 2014-10-31 NOTE — Assessment & Plan Note (Signed)
Has evidence of fungal infection involving his toe and hand nails.  Plan Start Terbinafine 250 mg daily for 6 weeks I reviewed his medications and found no potential interaction with this medication Check baseline liver panel Referral to podiatry

## 2014-11-06 DIAGNOSIS — F329 Major depressive disorder, single episode, unspecified: Secondary | ICD-10-CM | POA: Diagnosis not present

## 2014-11-12 ENCOUNTER — Other Ambulatory Visit: Payer: Medicare Other

## 2014-11-12 DIAGNOSIS — B1920 Unspecified viral hepatitis C without hepatic coma: Secondary | ICD-10-CM | POA: Diagnosis not present

## 2014-11-12 DIAGNOSIS — B20 Human immunodeficiency virus [HIV] disease: Secondary | ICD-10-CM | POA: Diagnosis not present

## 2014-11-12 LAB — COMPREHENSIVE METABOLIC PANEL
ALK PHOS: 81 U/L (ref 39–117)
ALT: 13 U/L (ref 0–53)
AST: 17 U/L (ref 0–37)
Albumin: 3.9 g/dL (ref 3.5–5.2)
BILIRUBIN TOTAL: 0.3 mg/dL (ref 0.2–1.2)
BUN: 8 mg/dL (ref 6–23)
CO2: 22 meq/L (ref 19–32)
CREATININE: 0.87 mg/dL (ref 0.50–1.35)
Calcium: 9.1 mg/dL (ref 8.4–10.5)
Chloride: 103 mEq/L (ref 96–112)
GLUCOSE: 160 mg/dL — AB (ref 70–99)
Potassium: 4 mEq/L (ref 3.5–5.3)
Sodium: 133 mEq/L — ABNORMAL LOW (ref 135–145)
TOTAL PROTEIN: 8 g/dL (ref 6.0–8.3)

## 2014-11-12 LAB — CBC WITH DIFFERENTIAL/PLATELET
BASOS ABS: 0 10*3/uL (ref 0.0–0.1)
Basophils Relative: 0 % (ref 0–1)
EOS ABS: 0.3 10*3/uL (ref 0.0–0.7)
Eosinophils Relative: 4 % (ref 0–5)
HCT: 40.8 % (ref 39.0–52.0)
HEMOGLOBIN: 13.8 g/dL (ref 13.0–17.0)
Lymphocytes Relative: 30 % (ref 12–46)
Lymphs Abs: 2.3 10*3/uL (ref 0.7–4.0)
MCH: 28.3 pg (ref 26.0–34.0)
MCHC: 33.8 g/dL (ref 30.0–36.0)
MCV: 83.8 fL (ref 78.0–100.0)
MPV: 11.6 fL (ref 8.6–12.4)
Monocytes Absolute: 0.6 10*3/uL (ref 0.1–1.0)
Monocytes Relative: 8 % (ref 3–12)
Neutro Abs: 4.4 10*3/uL (ref 1.7–7.7)
Neutrophils Relative %: 58 % (ref 43–77)
Platelets: 199 10*3/uL (ref 150–400)
RBC: 4.87 MIL/uL (ref 4.22–5.81)
RDW: 15.1 % (ref 11.5–15.5)
WBC: 7.5 10*3/uL (ref 4.0–10.5)

## 2014-11-13 LAB — T-HELPER CELL (CD4) - (RCID CLINIC ONLY)
CD4 T CELL HELPER: 18 % — AB (ref 33–55)
CD4 T Cell Abs: 400 /uL (ref 400–2700)

## 2014-11-13 LAB — HEPATITIS C RNA QUANTITATIVE: HCV QUANT: NOT DETECTED [IU]/mL (ref <15)

## 2014-11-14 LAB — HIV-1 RNA QUANT-NO REFLEX-BLD: HIV 1 RNA Quant: <20 copies/mL (ref <20)

## 2014-11-15 ENCOUNTER — Emergency Department (INDEPENDENT_AMBULATORY_CARE_PROVIDER_SITE_OTHER)
Admission: EM | Admit: 2014-11-15 | Discharge: 2014-11-15 | Disposition: A | Discharge status: Home / Self Care | Payer: Medicare Other | Source: Home / Self Care | Attending: Emergency Medicine | Admitting: Emergency Medicine

## 2014-11-15 ENCOUNTER — Encounter (HOSPITAL_COMMUNITY): Payer: Self-pay | Admitting: Emergency Medicine

## 2014-11-15 DIAGNOSIS — M2669 Other specified disorders of temporomandibular joint: Secondary | ICD-10-CM

## 2014-11-15 DIAGNOSIS — R220 Localized swelling, mass and lump, head: Secondary | ICD-10-CM

## 2014-11-15 MED ORDER — CEPHALEXIN 500 MG PO CAPS: 500.0000 mg | ORAL_CAPSULE | Freq: Four times a day (QID) | ORAL | Status: DC

## 2014-11-15 NOTE — ED Notes (Signed)
Right jaw swelling, pain since last night.  Initially had facial numbness, now the area is swollen and painful.  Reports only four teeth in front of mouth, teeth beyond this have been pulled.  Denies ear pain.

## 2014-11-15 NOTE — ED Provider Notes (Signed)
CSN: [id number]     Arrival date & time 11/15/14  1156 History   First MD Initiated Contact with Patient 11/15/14 1356     Chief Complaint  Patient presents with  . Facial Swelling   (Consider location/radiation/quality/duration/timing/severity/associated sxs/prior Treatment) HPI  He is a 59 year old man here for evaluation of right jaw swelling. He states this happened overnight. He denies any pain, but does report a numb feeling in the swollen area. He does not have any teeth in that area. He denies any ear pain. No fevers or chills. No difficulty swallowing or throat pain. No nasal congestion or rhinorrhea. He denies any injury or trauma.  Past Medical History  Diagnosis Date  . Hypertension   . HIV (human immunodeficiency virus infection)   . Depression   . Stroke 11/2011    Carotids Doppler negative. Right sided weakness resolved, initially on Plavix for 3 months and then continued with ASA.    . TB lung, latent 1988    Treated  . Calcium oxalate crystals in urine 12/23/2011    Asymptomatic, no hematuria. Advised to take plenty of water.  . Hepatitis C    Past Surgical History  Procedure Laterality Date  . Tee without cardioversion  12/07/2011    Procedure: TRANSESOPHAGEAL ECHOCARDIOGRAM (TEE);  Surgeon: Birdie Riddle, MD;  Location: [facility];  Service: Cardiovascular;  Laterality: N/A;  . Inguinal hernia repair  01/16/2012    Procedure: HERNIA REPAIR INGUINAL INCARCERATED;  Surgeon: Zenovia Jarred, MD;  Location: [facility];  Service: General;  Laterality: Right;  . Hernia repair     Family History  Problem Relation Age of Onset  . Hypertension Mother   . Hypertension Father   . Cancer Father    History  Substance Use Topics  . Smoking status: Current Every Day Smoker -- 0.50 packs/day for 41 years    Types: Cigarettes  . Smokeless tobacco: Never Used     Comment: 1/2 PPD  . Alcohol Use: No    Review of Systems As in history of present illness Allergies  Statins  and Metformin and related  Home Medications   Prior to Admission medications   Medication Sig Start Date End Date Taking? Authorizing Provider  albuterol (PROVENTIL HFA;VENTOLIN HFA) 108 (90 BASE) MCG/ACT inhaler Inhale 2 puffs into the lungs every 6 (six) hours as needed for wheezing or shortness of breath. 07/05/13   Jessee Avers, MD  cephALEXin (KEFLEX) 500 MG capsule Take 1 capsule (500 mg total) by mouth 4 (four) times daily. 11/15/14   Melony Overly, MD  Darunavir Ethanolate (PREZISTA) 800 MG tablet Take 1 tablet (800 mg total) by mouth daily with breakfast. 09/10/14   Michel Bickers, MD  elvitegravir-cobicistat-emtricitabine-tenofovir (GENVOYA) 150-150-200-10 MG TABS tablet Take 1 tablet by mouth daily with breakfast. 09/10/14   Michel Bickers, MD  glipiZIDE (GLUCOTROL) 5 MG tablet Take 1 tablet (5 mg total) by mouth daily before breakfast. 10/31/14   Jessee Avers, MD  Ledipasvir-Sofosbuvir (HARVONI) 90-400 MG TABS Take 1 tablet by mouth daily with breakfast. 07/25/14   Michel Bickers, MD  lisinopril (PRINIVIL,ZESTRIL) 20 MG tablet Take 1 tablet (20 mg total) by mouth daily. 08/29/14   Jessee Avers, MD  metoprolol tartrate (LOPRESSOR) 25 MG tablet Take 1 tablet (25 mg total) by mouth 2 (two) times daily. 11/29/13   Thayer Headings, MD  miconazole (MICATIN) 2 % cream Apply 1 application topically 2 (two) times daily. Apply to affected areas Patient not taking: Reported on 09/10/2014  11/18/13   Blanchie Dessert, MD  mometasone-formoterol (DULERA) 100-5 MCG/ACT AERO Inhale 2 puffs into the lungs 2 (two) times daily. 09/11/14   Jessee Avers, MD  PREZISTA 800 MG tablet TAKE 1 TABLET BY MOUTH EVERY DAY WITH BREAKFAST 10/23/14   Michel Bickers, MD  QUEtiapine Fumarate (SEROQUEL XR) 150 MG 24 hr tablet Take 150 mg by mouth at bedtime.    Historical Provider, MD  SPIRIVA HANDIHALER 18 MCG inhalation capsule INHALE THE CONTENTS OF 1 CASPSULE USING HANDIHALER EVERY DAY 02/01/14   Jessee Avers, MD    terbinafine (LAMISIL) 250 MG tablet Take 1 tablet (250 mg total) by mouth daily. 10/31/14   Jessee Avers, MD  valACYclovir (VALTREX) 500 MG tablet Take 1 tablet (500 mg total) by mouth 2 (two) times daily. 09/11/14   Campbell Riches, MD  zolpidem (AMBIEN) 10 MG tablet Take 10 mg by mouth at bedtime as needed. For sleep    Historical Provider, MD   BP 121/83 mmHg  Pulse 70  Temp(Src) 98 F (36.7 C) (Oral)  Resp 16  SpO2 96% Physical Exam  Constitutional: He is oriented to person, place, and time. He appears well-developed and well-nourished. No distress.  HENT:  Head:    Right Ear: External ear normal.  Left Ear: External ear normal.  Nose: Nose normal.  Mouth/Throat: Oropharynx is clear and moist. No oropharyngeal exudate.  He does have some mild swelling of the right sublingual duct opening  Eyes: Conjunctivae are normal.  Neck: Neck supple.  Cardiovascular: Normal rate.   Pulmonary/Chest: Effort normal.  Lymphadenopathy:    He has no cervical adenopathy.  Neurological: He is alert and oriented to person, place, and time.    ED Course  Procedures (including critical care time) Labs Review Labs Reviewed - No data to display  Imaging Review No results found.   MDM   1. Jaw swelling    We'll treat as a blocked salivary gland. Keflex for 10 days. If no improvement by Monday, he will come back. Return precautions reviewed.    Melony Overly, MD 11/15/14 [phone]

## 2014-11-15 NOTE — Discharge Instructions (Signed)
I think one of your salivary glands may be blocked. Alternate warm and cold compresses to the affected area. Take Keflex 4 times a day for the next 10 days. If your symptoms are not improving by Monday, please come back. If you develop fevers or difficulty swallowing, please go to the emergency room.

## 2014-11-19 ENCOUNTER — Encounter (HOSPITAL_COMMUNITY): Payer: Self-pay | Admitting: Emergency Medicine

## 2014-11-19 ENCOUNTER — Emergency Department (HOSPITAL_COMMUNITY): Payer: Medicare Other

## 2014-11-19 ENCOUNTER — Inpatient Hospital Stay (HOSPITAL_COMMUNITY)
Admission: EM | Admit: 2014-11-19 | Discharge: 2014-11-22 | DRG: 064 | Disposition: A | Discharge status: Home / Self Care | Payer: Medicare Other | Attending: Internal Medicine | Admitting: Internal Medicine

## 2014-11-19 DIAGNOSIS — B351 Tinea unguium: Secondary | ICD-10-CM | POA: Diagnosis present

## 2014-11-19 DIAGNOSIS — I6340 Cerebral infarction due to embolism of unspecified cerebral artery: Secondary | ICD-10-CM | POA: Diagnosis not present

## 2014-11-19 DIAGNOSIS — B20 Human immunodeficiency virus [HIV] disease: Secondary | ICD-10-CM | POA: Diagnosis present

## 2014-11-19 DIAGNOSIS — R55 Syncope and collapse: Secondary | ICD-10-CM | POA: Diagnosis present

## 2014-11-19 DIAGNOSIS — B1920 Unspecified viral hepatitis C without hepatic coma: Secondary | ICD-10-CM | POA: Diagnosis present

## 2014-11-19 DIAGNOSIS — A6000 Herpesviral infection of urogenital system, unspecified: Secondary | ICD-10-CM | POA: Diagnosis present

## 2014-11-19 DIAGNOSIS — Z8673 Personal history of transient ischemic attack (TIA), and cerebral infarction without residual deficits: Secondary | ICD-10-CM

## 2014-11-19 DIAGNOSIS — S0990XA Unspecified injury of head, initial encounter: Secondary | ICD-10-CM | POA: Diagnosis not present

## 2014-11-19 DIAGNOSIS — G450 Vertebro-basilar artery syndrome: Secondary | ICD-10-CM

## 2014-11-19 DIAGNOSIS — F1910 Other psychoactive substance abuse, uncomplicated: Secondary | ICD-10-CM | POA: Diagnosis present

## 2014-11-19 DIAGNOSIS — E119 Type 2 diabetes mellitus without complications: Secondary | ICD-10-CM | POA: Diagnosis not present

## 2014-11-19 DIAGNOSIS — J449 Chronic obstructive pulmonary disease, unspecified: Secondary | ICD-10-CM | POA: Diagnosis present

## 2014-11-19 DIAGNOSIS — I10 Essential (primary) hypertension: Secondary | ICD-10-CM | POA: Diagnosis not present

## 2014-11-19 DIAGNOSIS — E785 Hyperlipidemia, unspecified: Secondary | ICD-10-CM | POA: Diagnosis present

## 2014-11-19 DIAGNOSIS — F419 Anxiety disorder, unspecified: Secondary | ICD-10-CM | POA: Diagnosis present

## 2014-11-19 DIAGNOSIS — Z7984 Long term (current) use of oral hypoglycemic drugs: Secondary | ICD-10-CM

## 2014-11-19 DIAGNOSIS — F329 Major depressive disorder, single episode, unspecified: Secondary | ICD-10-CM | POA: Diagnosis not present

## 2014-11-19 DIAGNOSIS — R4781 Slurred speech: Secondary | ICD-10-CM | POA: Diagnosis not present

## 2014-11-19 DIAGNOSIS — G4700 Insomnia, unspecified: Secondary | ICD-10-CM | POA: Diagnosis present

## 2014-11-19 DIAGNOSIS — I6789 Other cerebrovascular disease: Secondary | ICD-10-CM | POA: Diagnosis not present

## 2014-11-19 DIAGNOSIS — F17210 Nicotine dependence, cigarettes, uncomplicated: Secondary | ICD-10-CM | POA: Diagnosis present

## 2014-11-19 DIAGNOSIS — K1120 Sialoadenitis, unspecified: Secondary | ICD-10-CM | POA: Diagnosis present

## 2014-11-19 DIAGNOSIS — R4182 Altered mental status, unspecified: Secondary | ICD-10-CM | POA: Diagnosis not present

## 2014-11-19 LAB — RAPID URINE DRUG SCREEN, HOSP PERFORMED
Amphetamines: NOT DETECTED
BARBITURATES: NOT DETECTED
Benzodiazepines: NOT DETECTED
COCAINE: NOT DETECTED
Opiates: NOT DETECTED
Tetrahydrocannabinol: NOT DETECTED

## 2014-11-19 LAB — COMPREHENSIVE METABOLIC PANEL
ALBUMIN: 3.4 g/dL — AB (ref 3.5–5.0)
ALT: 15 U/L — ABNORMAL LOW (ref 17–63)
AST: 23 U/L (ref 15–41)
Alkaline Phosphatase: 87 U/L (ref 38–126)
Anion gap: 9 (ref 5–15)
BUN: 16 mg/dL (ref 6–20)
CALCIUM: 9 mg/dL (ref 8.9–10.3)
CO2: 20 mmol/L — AB (ref 22–32)
Chloride: 108 mmol/L (ref 101–111)
Creatinine, Ser: 1.18 mg/dL (ref 0.61–1.24)
GFR calc Af Amer: >60 mL/min (ref >60)
GFR calc non Af Amer: >60 mL/min (ref >60)
Glucose, Bld: 93 mg/dL (ref 70–99)
Potassium: 4.4 mmol/L (ref 3.5–5.1)
Sodium: 137 mmol/L (ref 135–145)
Total Bilirubin: 0.7 mg/dL (ref 0.3–1.2)
Total Protein: 8.3 g/dL — ABNORMAL HIGH (ref 6.5–8.1)

## 2014-11-19 LAB — URINALYSIS, ROUTINE W REFLEX MICROSCOPIC
BILIRUBIN URINE: NEGATIVE
Glucose, UA: >1000 mg/dL — AB
Hgb urine dipstick: NEGATIVE
Ketones, ur: 15 mg/dL — AB
LEUKOCYTES UA: NEGATIVE
Nitrite: NEGATIVE
Protein, ur: NEGATIVE mg/dL
Specific Gravity, Urine: 1.023 (ref 1.005–1.030)
Urobilinogen, UA: 1 mg/dL (ref 0.0–1.0)
pH: 5 (ref 5.0–8.0)

## 2014-11-19 LAB — TROPONIN I
Troponin I: <0.03 ng/mL (ref <0.031)
Troponin I: <0.03 ng/mL (ref <0.031)

## 2014-11-19 LAB — CBC
HCT: 41.8 % (ref 39.0–52.0)
HEMOGLOBIN: 13.8 g/dL (ref 13.0–17.0)
MCH: 28.6 pg (ref 26.0–34.0)
MCHC: 33 g/dL (ref 30.0–36.0)
MCV: 86.7 fL (ref 78.0–100.0)
Platelets: 193 10*3/uL (ref 150–400)
RBC: 4.82 MIL/uL (ref 4.22–5.81)
RDW: 14.7 % (ref 11.5–15.5)
WBC: 7.6 10*3/uL (ref 4.0–10.5)

## 2014-11-19 LAB — URINE MICROSCOPIC-ADD ON

## 2014-11-19 LAB — CBG MONITORING, ED: Glucose-Capillary: 90 mg/dL (ref 70–99)

## 2014-11-19 MED ORDER — TIOTROPIUM BROMIDE MONOHYDRATE 18 MCG IN CAPS
18.0000 ug | ORAL_CAPSULE | Freq: Every day | RESPIRATORY_TRACT | Status: DC
  Administered 2014-11-20 – 2014-11-22 (×3): 18 ug via RESPIRATORY_TRACT
  Filled 2014-11-19: qty 5

## 2014-11-19 MED ORDER — VALACYCLOVIR HCL 500 MG PO TABS
500.0000 mg | ORAL_TABLET | Freq: Every day | ORAL | Status: DC
  Administered 2014-11-20 – 2014-11-22 (×3): 500 mg via ORAL
  Filled 2014-11-19 (×3): qty 1

## 2014-11-19 MED ORDER — CEPHALEXIN 500 MG PO CAPS
500.0000 mg | ORAL_CAPSULE | Freq: Four times a day (QID) | ORAL | Status: DC
  Administered 2014-11-19 – 2014-11-22 (×10): 500 mg via ORAL
  Filled 2014-11-19 (×10): qty 1

## 2014-11-19 MED ORDER — QUETIAPINE FUMARATE ER 200 MG PO TB24
200.0000 mg | ORAL_TABLET | Freq: Every day | ORAL | Status: DC
  Administered 2014-11-19 – 2014-11-21 (×3): 200 mg via ORAL
  Filled 2014-11-19 (×5): qty 1

## 2014-11-19 MED ORDER — CITALOPRAM HYDROBROMIDE 20 MG PO TABS
20.0000 mg | ORAL_TABLET | Freq: Every day | ORAL | Status: DC
  Administered 2014-11-20 – 2014-11-22 (×3): 20 mg via ORAL
  Filled 2014-11-19 (×3): qty 1

## 2014-11-19 MED ORDER — MOMETASONE FURO-FORMOTEROL FUM 100-5 MCG/ACT IN AERO
2.0000 | INHALATION_SPRAY | Freq: Two times a day (BID) | RESPIRATORY_TRACT | Status: DC
  Administered 2014-11-20 – 2014-11-22 (×5): 2 via RESPIRATORY_TRACT
  Filled 2014-11-19: qty 8.8

## 2014-11-19 MED ORDER — ASPIRIN EC 325 MG PO TBEC
325.0000 mg | DELAYED_RELEASE_TABLET | Freq: Every day | ORAL | Status: DC
  Administered 2014-11-19 – 2014-11-20 (×2): 325 mg via ORAL
  Filled 2014-11-19 (×2): qty 1

## 2014-11-19 MED ORDER — ALBUTEROL SULFATE (2.5 MG/3ML) 0.083% IN NEBU: 5.0000 mg | INHALATION_SOLUTION | Freq: Four times a day (QID) | RESPIRATORY_TRACT | Status: DC | PRN

## 2014-11-19 MED ORDER — ENOXAPARIN SODIUM 40 MG/0.4ML ~~LOC~~ SOLN
40.0000 mg | SUBCUTANEOUS | Status: DC
  Administered 2014-11-19 – 2014-11-21 (×3): 40 mg via SUBCUTANEOUS
  Filled 2014-11-19 (×3): qty 0.4

## 2014-11-19 MED ORDER — ELVITEG-COBIC-EMTRICIT-TENOFAF 150-150-200-10 MG PO TABS
1.0000 | ORAL_TABLET | Freq: Every day | ORAL | Status: DC
  Administered 2014-11-20 – 2014-11-22 (×3): 1 via ORAL
  Filled 2014-11-19 (×4): qty 1

## 2014-11-19 MED ORDER — TERBINAFINE HCL 250 MG PO TABS
250.0000 mg | ORAL_TABLET | Freq: Every day | ORAL | Status: DC
  Administered 2014-11-21 – 2014-11-22 (×2): 250 mg via ORAL
  Filled 2014-11-19 (×4): qty 1

## 2014-11-19 MED ORDER — LISINOPRIL 20 MG PO TABS: 20.0000 mg | ORAL_TABLET | Freq: Every day | ORAL | Status: DC

## 2014-11-19 MED ORDER — DARUNAVIR ETHANOLATE 800 MG PO TABS
800.0000 mg | ORAL_TABLET | Freq: Every day | ORAL | Status: DC
  Administered 2014-11-20 – 2014-11-22 (×3): 800 mg via ORAL
  Filled 2014-11-19 (×4): qty 1

## 2014-11-19 MED ORDER — SODIUM CHLORIDE 0.9 % IV SOLN
INTRAVENOUS | Status: AC
  Administered 2014-11-19: 22:00:00 via INTRAVENOUS

## 2014-11-19 MED ORDER — METOPROLOL TARTRATE 25 MG PO TABS: 25.0000 mg | ORAL_TABLET | Freq: Two times a day (BID) | ORAL | Status: DC

## 2014-11-19 MED ORDER — ACETAMINOPHEN 500 MG PO TABS: 500.0000 mg | ORAL_TABLET | Freq: Four times a day (QID) | ORAL | Status: DC | PRN

## 2014-11-19 MED ORDER — HYPROMELLOSE (GONIOSCOPIC) 2.5 % OP SOLN
1.0000 [drp] | OPHTHALMIC | Status: DC | PRN
  Filled 2014-11-19: qty 15

## 2014-11-19 NOTE — Consult Note (Signed)
Referring Physician: Dr. Lester [place]    Chief Complaint: transient vertigo and dysarthria, concern for TIA  HPI:                                                                                                                                         Jacob Joseph is an 59 y.o. male with a past medical history that is pertinent for HTN, DM type II, bilateral cortical infarcts without residual deficits, HIV disease, hepatitis C, polysubstance abuse, and smoking, brought in for further evaluation of the above stated symptoms. Patient stated that this afternoon he was walking in the supermarket in company of his wife when suddenly " thing started rotating, moving around, and I went down". He said that he did not have loss of awareness but was unable to think clearly say what he was thinking or form words. Of note, review of his hospital record showed that " his wife states that there was a period of 10-15 seconds when the patient was on the ground and staring off into space and was not responsive". No abnormal movements noted, bladder or bowel incontinence. Denies HA, double vision, difficulty seeing, or feeling sweaty or clammy before collapsing to the floor. He said that the whole episode lasted for approximately 45 minutes.  Patient not found to have orthostatic hypotension upon initial evaluation in the emergency department. CT brain was performed, personally reviewed, and showed no acute abnormality.   Routine serologies including UDS are unrevealing.  Date last known well: 11/19/14 Time last known well: 3 pm tPA Given: no, symptoms resolved   Past Medical History  Diagnosis Date  . Hypertension   . HIV (human immunodeficiency virus infection)   . Depression   . Stroke 11/2011    Carotids Doppler negative. Right sided weakness resolved, initially on Plavix for 3 months and then continued with ASA.    . TB lung, latent 1988    Treated  . Calcium oxalate crystals in urine 12/23/2011     Asymptomatic, no hematuria. Advised to take plenty of water.  . Hepatitis C     Past Surgical History  Procedure Laterality Date  . Tee without cardioversion  12/07/2011    Procedure: TRANSESOPHAGEAL ECHOCARDIOGRAM (TEE);  Surgeon: Birdie Riddle, MD;  Location: [facility];  Service: Cardiovascular;  Laterality: N/A;  . Inguinal hernia repair  01/16/2012    Procedure: HERNIA REPAIR INGUINAL INCARCERATED;  Surgeon: Zenovia Jarred, MD;  Location: [facility];  Service: General;  Laterality: Right;  . Hernia repair      Family History  Problem Relation Age of Onset  . Hypertension Mother   . Hypertension Father   . Cancer Father    Social History:  reports that he has been smoking Cigarettes.  He has a 20.5 pack-year smoking history. He has never used smokeless tobacco. He reports that he uses illicit drugs ("Crack" cocaine, Cocaine, and  Marijuana) about once per week. He reports that he does not drink alcohol. Family history: no brain tumors, brain aneurysms, or epilepsy Allergies:  Allergies  Allergen Reactions  . Statins Other (See Comments)    Elevated liver enzymes.  . Metformin And Related Diarrhea    Medications:                                                                                                                           Scheduled: . aspirin EC  325 mg Oral Daily  . cephALEXin  500 mg Oral QID  . [START ON 11/20/2014] citalopram  20 mg Oral Daily  . [START ON 11/20/2014] Darunavir Ethanolate  800 mg Oral Q breakfast  . [START ON 11/20/2014] elvitegravir-cobicistat-emtricitabine-tenofovir  1 tablet Oral Q breakfast  . enoxaparin (LOVENOX) injection  40 mg Subcutaneous Q24H  . [START ON 11/20/2014] lisinopril  20 mg Oral Daily  . [START ON 11/20/2014] metoprolol tartrate  25 mg Oral BID  . mometasone-formoterol  2 puff Inhalation BID  . QUEtiapine  200 mg Oral QHS  . terbinafine  250 mg Oral Daily  . [START ON 11/20/2014] tiotropium  18 mcg Inhalation Daily  . [START ON  11/20/2014] valACYclovir  500 mg Oral Daily    ROS:                                                                                                                                       History obtained from the patient and chart review  General ROS: negative for - chills, fatigue, fever, night sweats, weight gain or weight loss Psychological ROS: negative for - behavioral disorder, hallucinations, memory difficulties, mood swings or suicidal ideation Ophthalmic ROS: negative for - blurry vision, double vision, eye pain or loss of vision ENT ROS: negative for - epistaxis, nasal discharge, oral lesions, sore throat, tinnitus or vertigo Allergy and Immunology ROS: negative for - hives or itchy/watery eyes Hematological and Lymphatic ROS: negative for - bleeding problems, bruising or swollen lymph nodes Endocrine ROS: negative for - galactorrhea, hair pattern changes, polydipsia/polyuria or temperature intolerance Respiratory ROS: negative for - cough, hemoptysis, shortness of breath or wheezing Cardiovascular ROS: negative for - chest pain, dyspnea on exertion, edema or irregular heartbeat Gastrointestinal ROS: negative for - abdominal pain, diarrhea, hematemesis, nausea/vomiting or stool incontinence Genito-Urinary ROS: negative for - dysuria, hematuria, incontinence or urinary frequency/urgency  Musculoskeletal ROS: negative for - joint swelling or muscular weakness Neurological ROS: as noted in HPI Dermatological ROS: negative for rash and skin lesion changes   Physical exam: pleasant male in no apparent distress. Blood pressure 145/85, pulse 75, temperature 98 F (36.7 C), temperature source Oral, resp. rate 14, SpO2 98 %. Head: normocephalic. Neck: supple, no bruits, no JVD. Cardiac: no murmurs. Lungs: clear. Abdomen: soft, no tender, no mass. Extremities: no edema. Skin: no rash Neurologic Examination:                                                                                                       General: Mental Status: Alert, oriented, thought content appropriate.  Speech fluent without evidence of aphasia.  Able to follow 3 step commands without difficulty. Cranial Nerves: II: Discs flat bilaterally; Visual fields grossly normal, pupils equal, round, reactive to light and accommodation III,IV, VI: ptosis not present, extra-ocular motions intact bilaterally V,VII: smile symmetric, facial light touch sensation normal bilaterally VIII: hearing normal bilaterally IX,X: uvula rises symmetrically XI: bilateral shoulder shrug XII: midline tongue extension without atrophy or fasciculations  Motor: Right : Upper extremity   5/5    Left:     Upper extremity   5/5  Lower extremity   5/5     Lower extremity   5/5 Tone and bulk:normal tone throughout; no atrophy noted Sensory: Pinprick and light touch intact throughout, bilaterally Deep Tendon Reflexes:  Right: Upper Extremity   Left: Upper extremity   biceps (C-5 to C-6) 2/4   biceps (C-5 to C-6) 2/4 tricep (C7) 2/4    triceps (C7) 2/4 Brachioradialis (C6) 2/4  Brachioradialis (C6) 2/4  Lower Extremity Lower Extremity  quadriceps (L-2 to L-4) 2/4   quadriceps (L-2 to L-4) 2/4 Achilles (S1) 2/4   Achilles (S1) 2/4  Plantars: Right: downgoing   Left: downgoing Cerebellar: normal finger-to-nose,  normal heel-to-shin test Gait:  Patient on falls precautions, thus no tested      Results for orders placed or performed during the hospital encounter of 11/19/14 (from the past 48 hour(s))  CBC     Status: None   Collection Time: 11/19/14  4:07 PM  Result Value Ref Range   WBC 7.6 4.0 - 10.5 K/uL   RBC 4.82 4.22 - 5.81 MIL/uL   Hemoglobin 13.8 13.0 - 17.0 g/dL   HCT 41.8 39.0 - 52.0 %   MCV 86.7 78.0 - 100.0 fL   MCH 28.6 26.0 - 34.0 pg   MCHC 33.0 30.0 - 36.0 g/dL   RDW 14.7 11.5 - 15.5 %   Platelets 193 150 - 400 K/uL  Comprehensive metabolic panel     Status: Abnormal   Collection Time: 11/19/14  4:07 PM   Result Value Ref Range   Sodium 137 135 - 145 mmol/L   Potassium 4.4 3.5 - 5.1 mmol/L   Chloride 108 101 - 111 mmol/L   CO2 20 (L) 22 - 32 mmol/L   Glucose, Bld 93 70 - 99 mg/dL   BUN 16 6 - 20 mg/dL   Creatinine,  Ser 1.18 0.61 - 1.24 mg/dL   Calcium 9.0 8.9 - 10.3 mg/dL   Total Protein 8.3 (H) 6.5 - 8.1 g/dL   Albumin 3.4 (L) 3.5 - 5.0 g/dL   AST 23 15 - 41 U/L   ALT 15 (L) 17 - 63 U/L   Alkaline Phosphatase 87 38 - 126 U/L   Total Bilirubin 0.7 0.3 - 1.2 mg/dL   GFR calc non Af Amer >60 >60 mL/min   GFR calc Af Amer >60 >60 mL/min    Comment: (NOTE) The eGFR has been calculated using the CKD EPI equation. This calculation has not been validated in all clinical situations. eGFR's persistently <90 mL/min signify possible Chronic Kidney Disease.    Anion gap 9 5 - 15  Troponin I     Status: None   Collection Time: 11/19/14  4:07 PM  Result Value Ref Range   Troponin I <0.03 <0.031 ng/mL    Comment:        NO INDICATION OF MYOCARDIAL INJURY.   CBG monitoring, ED     Status: None   Collection Time: 11/19/14  4:32 PM  Result Value Ref Range   Glucose-Capillary 90 70 - 99 mg/dL  Urinalysis, Routine w reflex microscopic     Status: Abnormal   Collection Time: 11/19/14  4:50 PM  Result Value Ref Range   Color, Urine YELLOW YELLOW   APPearance CLEAR CLEAR   Specific Gravity, Urine 1.023 1.005 - 1.030   pH 5.0 5.0 - 8.0   Glucose, UA >1000 (A) NEGATIVE mg/dL   Hgb urine dipstick NEGATIVE NEGATIVE   Bilirubin Urine NEGATIVE NEGATIVE   Ketones, ur 15 (A) NEGATIVE mg/dL   Protein, ur NEGATIVE NEGATIVE mg/dL   Urobilinogen, UA 1.0 0.0 - 1.0 mg/dL   Nitrite NEGATIVE NEGATIVE   Leukocytes, UA NEGATIVE NEGATIVE  Urine microscopic-add on     Status: Abnormal   Collection Time: 11/19/14  4:50 PM  Result Value Ref Range   Squamous Epithelial / LPF FEW (A) RARE   WBC, UA 0-2 <3 WBC/hpf   RBC / HPF 3-6 <3 RBC/hpf  Urine rapid drug screen (hosp performed)     Status: None    Collection Time: 11/19/14  4:50 PM  Result Value Ref Range   Opiates NONE DETECTED NONE DETECTED   Cocaine NONE DETECTED NONE DETECTED   Benzodiazepines NONE DETECTED NONE DETECTED   Amphetamines NONE DETECTED NONE DETECTED   Tetrahydrocannabinol NONE DETECTED NONE DETECTED   Barbiturates NONE DETECTED NONE DETECTED    Comment:        DRUG SCREEN FOR MEDICAL PURPOSES ONLY.  IF CONFIRMATION IS NEEDED FOR ANY PURPOSE, NOTIFY LAB WITHIN 5 DAYS.        LOWEST DETECTABLE LIMITS FOR URINE DRUG SCREEN Drug Class       Cutoff (ng/mL) Amphetamine      1000 Barbiturate      200 Benzodiazepine   349 Tricyclics       179 Opiates          300 Cocaine          300 THC              50    Dg Chest 2 View  11/19/2014   CLINICAL DATA:  Syncope, headache  EXAM: CHEST  2 VIEW  COMPARISON:  Five/ 2/15  FINDINGS: Cardiomediastinal silhouette is stable. No acute infiltrate or pleural effusion. No pulmonary edema. Stable bilateral basilar atelectasis or scarring.  IMPRESSION: No active  cardiopulmonary disease.   Electronically Signed   By: Lahoma Crocker M.D.   On: 11/19/2014 17:08   Ct Head Wo Contrast  11/19/2014   CLINICAL DATA:  Altered mental status.  Fall.  EXAM: CT HEAD WITHOUT CONTRAST  TECHNIQUE: Contiguous axial images were obtained from the base of the skull through the vertex without intravenous contrast.  COMPARISON:  MRI 03/09/2012.  FINDINGS: No mass lesion, mass effect, midline shift, hydrocephalus, hemorrhage. No acute territorial cortical ischemia/infarct. Atrophy and chronic ischemic white matter disease is present. Carotid atherosclerosis is present. Mild paranasal sinus disease.  IMPRESSION: Atrophy and chronic ischemic white matter disease without acute intracranial abnormality.   Electronically Signed   By: Dereck Ligas M.D.   On: 11/19/2014 18:22    Assessment: 59 y.o. male with several risks factors for stroke, admitted after sustaining a transient episode of vertigo an difficulty  expressing himself. Patient denied experiencing associated loss of awareness but as per wife initial description to the ED there was a transient period of 10-15 seconds when the patient was on the ground and staring off into space and was not responsive. Non focal neuro-exam, CT brain unremarkable for acute abnormality. UDS negative. Differential includes brainstem TIA, less likely seizure. Agree with completing TIA work up as detailed below. EEG. Aspirin pending neuro testing. Stroke team will follow up tomorrow.  Stroke Risk Factors -  HTN, DM type II, prior stroke, polysubstance abuse, and smoking.  Plan: 1. HgbA1c, fasting lipid panel 2. MRI, MRA  of the brain without contrast 3. Echocardiogram 4. Carotid dopplers 5. Prophylactic therapy-aspirin 6. Risk factor modification 7. Telemetry monitoring 8. Frequent neuro checks 9. PT/OT SLP (no needed at this moment)  Dorian Pod, MD [facility] [phone]  11/19/2014, 10:04 PM

## 2014-11-19 NOTE — ED Provider Notes (Signed)
CSN: [id number]     Arrival date & time 11/19/14  1528 History   First MD Initiated Contact with Patient 11/19/14 1549     Chief Complaint  Patient presents with  . Altered Mental Status     (Consider location/radiation/quality/duration/timing/severity/associated sxs/prior Treatment) HPI Comments: Patient states that he was feeling tired today because he woke up early but otherwise felt his normal self. He had not eaten today and they were at the grocery store when he was walking out of the car he started to feel dizzy which he described as a rotational sensation and then he lost consciousness. Her significant other and EMS patient was initially confused and having difficulty following exams. However here his significant other states that he is at his baseline. She denied any focal weakness or facial droop. No new medications. No chest pain, palpitations, shortness of breath, no cough, diarrhea, vomiting. No alcohol or drug use in 90 days  Patient is a 59 y.o. male presenting with syncope. The history is provided by the patient.  Loss of Consciousness Episode history:  Single Most recent episode:  Today Timing:  Constant Progression:  Resolved Chronicity:  New Context comment:  Patient was walking out of [employer] when his significant tenderness around and he was laying on the ground unconscious. Witnessed: yes   Relieved by:  Lying down Worsened by:  Nothing tried Ineffective treatments:  None tried Associated symptoms: confusion, dizziness and headaches   Associated symptoms: no chest pain, no difficulty breathing, no fever, no focal weakness, no nausea, no palpitations, no recent surgery, no rectal bleeding, no shortness of breath, no vomiting and no weakness   Risk factors: no coronary artery disease   Risk factors comment:  Hypertension, HIV, stroke   Past Medical History  Diagnosis Date  . Hypertension   . HIV (human immunodeficiency virus infection)   . Depression   . Stroke  11/2011    Carotids Doppler negative. Right sided weakness resolved, initially on Plavix for 3 months and then continued with ASA.    . TB lung, latent 1988    Treated  . Calcium oxalate crystals in urine 12/23/2011    Asymptomatic, no hematuria. Advised to take plenty of water.  . Hepatitis C    Past Surgical History  Procedure Laterality Date  . Tee without cardioversion  12/07/2011    Procedure: TRANSESOPHAGEAL ECHOCARDIOGRAM (TEE);  Surgeon: Birdie Riddle, MD;  Location: [facility];  Service: Cardiovascular;  Laterality: N/A;  . Inguinal hernia repair  01/16/2012    Procedure: HERNIA REPAIR INGUINAL INCARCERATED;  Surgeon: Zenovia Jarred, MD;  Location: [facility];  Service: General;  Laterality: Right;  . Hernia repair     Family History  Problem Relation Age of Onset  . Hypertension Mother   . Hypertension Father   . Cancer Father    History  Substance Use Topics  . Smoking status: Current Every Day Smoker -- 0.50 packs/day for 41 years    Types: Cigarettes  . Smokeless tobacco: Never Used     Comment: 1/2 PPD  . Alcohol Use: No    Review of Systems  Constitutional: Negative for fever.  Respiratory: Negative for shortness of breath.   Cardiovascular: Positive for syncope. Negative for chest pain and palpitations.  Gastrointestinal: Negative for nausea and vomiting.  Neurological: Positive for dizziness and headaches. Negative for focal weakness and weakness.  Psychiatric/Behavioral: Positive for confusion.  All other systems reviewed and are negative.     Allergies  Statins and Metformin and related  Home Medications   Prior to Admission medications   Medication Sig Start Date End Date Taking? Authorizing Provider  albuterol (PROVENTIL HFA;VENTOLIN HFA) 108 (90 BASE) MCG/ACT inhaler Inhale 2 puffs into the lungs every 6 (six) hours as needed for wheezing or shortness of breath. 07/05/13   Jessee Avers, MD  cephALEXin (KEFLEX) 500 MG capsule Take 1 capsule (500  mg total) by mouth 4 (four) times daily. 11/15/14   Melony Overly, MD  Darunavir Ethanolate (PREZISTA) 800 MG tablet Take 1 tablet (800 mg total) by mouth daily with breakfast. 09/10/14   Michel Bickers, MD  elvitegravir-cobicistat-emtricitabine-tenofovir (GENVOYA) 150-150-200-10 MG TABS tablet Take 1 tablet by mouth daily with breakfast. 09/10/14   Michel Bickers, MD  glipiZIDE (GLUCOTROL) 5 MG tablet Take 1 tablet (5 mg total) by mouth daily before breakfast. 10/31/14   Jessee Avers, MD  Ledipasvir-Sofosbuvir (HARVONI) 90-400 MG TABS Take 1 tablet by mouth daily with breakfast. 07/25/14   Michel Bickers, MD  lisinopril (PRINIVIL,ZESTRIL) 20 MG tablet Take 1 tablet (20 mg total) by mouth daily. 08/29/14   Jessee Avers, MD  metoprolol tartrate (LOPRESSOR) 25 MG tablet Take 1 tablet (25 mg total) by mouth 2 (two) times daily. 11/29/13   Thayer Headings, MD  miconazole (MICATIN) 2 % cream Apply 1 application topically 2 (two) times daily. Apply to affected areas Patient not taking: Reported on 09/10/2014 11/18/13   Blanchie Dessert, MD  mometasone-formoterol (DULERA) 100-5 MCG/ACT AERO Inhale 2 puffs into the lungs 2 (two) times daily. 09/11/14   Jessee Avers, MD  PREZISTA 800 MG tablet TAKE 1 TABLET BY MOUTH EVERY DAY WITH BREAKFAST 10/23/14   Michel Bickers, MD  QUEtiapine Fumarate (SEROQUEL XR) 150 MG 24 hr tablet Take 150 mg by mouth at bedtime.    Historical Provider, MD  SPIRIVA HANDIHALER 18 MCG inhalation capsule INHALE THE CONTENTS OF 1 CASPSULE USING HANDIHALER EVERY DAY 02/01/14   Jessee Avers, MD  terbinafine (LAMISIL) 250 MG tablet Take 1 tablet (250 mg total) by mouth daily. 10/31/14   Jessee Avers, MD  valACYclovir (VALTREX) 500 MG tablet Take 1 tablet (500 mg total) by mouth 2 (two) times daily. 09/11/14   Campbell Riches, MD  zolpidem (AMBIEN) 10 MG tablet Take 10 mg by mouth at bedtime as needed. For sleep    Historical Provider, MD   BP 114/96 mmHg  Pulse 67  Temp(Src) 97.6 F (36.4  C) (Oral)  Resp 13  SpO2 97% Physical Exam  Constitutional: He is oriented to person, place, and time. He appears well-developed and well-nourished. No distress.  HENT:  Head: Normocephalic and atraumatic.  Mouth/Throat: Oropharynx is clear and moist.  Eyes: Conjunctivae and EOM are normal. Pupils are equal, round, and reactive to light.  Neck: Normal range of motion. Neck supple.  Cardiovascular: Normal rate, regular rhythm and intact distal pulses.   No murmur heard. Pulmonary/Chest: Effort normal and breath sounds normal. No respiratory distress. He has no wheezes. He has no rales.  Abdominal: Soft. He exhibits no distension. There is no tenderness. There is no rebound and no guarding.  Musculoskeletal: Normal range of motion. He exhibits no edema or tenderness.  Neurological: He is alert and oriented to person, place, and time. He has normal strength. No cranial nerve deficit or sensory deficit. Coordination normal.  Heel-to-shin normal. Finger to nose testing normal. No gaze deviation. No visual field cuts.  Skin: Skin is warm and dry. No rash noted. No erythema.  Psychiatric: He has a normal mood and affect. His behavior is normal.  Nursing note and vitals reviewed.   ED Course  Procedures (including critical care time) Labs Review Labs Reviewed  COMPREHENSIVE METABOLIC PANEL - Abnormal; Notable for the following:    CO2 20 (*)    Total Protein 8.3 (*)    Albumin 3.4 (*)    ALT 15 (*)    All other components within normal limits  URINALYSIS, ROUTINE W REFLEX MICROSCOPIC - Abnormal; Notable for the following:    Glucose, UA >1000 (*)    Ketones, ur 15 (*)    All other components within normal limits  URINE MICROSCOPIC-ADD ON - Abnormal; Notable for the following:    Squamous Epithelial / LPF FEW (*)    All other components within normal limits  CBC  TROPONIN I  CBG MONITORING, ED    Imaging Review Dg Chest 2 View  11/19/2014   CLINICAL DATA:  Syncope, headache   EXAM: CHEST  2 VIEW  COMPARISON:  Five/ 2/15  FINDINGS: Cardiomediastinal silhouette is stable. No acute infiltrate or pleural effusion. No pulmonary edema. Stable bilateral basilar atelectasis or scarring.  IMPRESSION: No active cardiopulmonary disease.   Electronically Signed   By: Lahoma Crocker M.D.   On: 11/19/2014 17:08   Ct Head Wo Contrast  11/19/2014   CLINICAL DATA:  Altered mental status.  Fall.  EXAM: CT HEAD WITHOUT CONTRAST  TECHNIQUE: Contiguous axial images were obtained from the base of the skull through the vertex without intravenous contrast.  COMPARISON:  MRI 03/09/2012.  FINDINGS: No mass lesion, mass effect, midline shift, hydrocephalus, hemorrhage. No acute territorial cortical ischemia/infarct. Atrophy and chronic ischemic white matter disease is present. Carotid atherosclerosis is present. Mild paranasal sinus disease.  IMPRESSION: Atrophy and chronic ischemic white matter disease without acute intracranial abnormality.   Electronically Signed   By: Dereck Ligas M.D.   On: 11/19/2014 18:22     EKG Interpretation   Date/Time:  Monday Nov 19 2014 15:36:39 EDT Ventricular Rate:  74 PR Interval:  157 QRS Duration: 101 QT Interval:  413 QTC Calculation: 458 R Axis:   -142 Text Interpretation:  Sinus or ectopic atrial rhythm RVH with secondary  repolarization abnrm new Abnormal T, consider ischemia, lateral leads  Confirmed by Maryan Rued  MD, Loree Fee ([id number]) on 11/19/2014 4:36:32 PM      MDM   Final diagnoses:  Syncope    Patient with a history of hypertension, HIV, stroke, hepatitis who presents today with what sounds like a syncopal event. Patient states that today he felt tired because he woke up earlier than normal but otherwise had no other complaints until he was walking out of the grocery store felt dizzy and then his significant other saw him on the ground. Initially he was slightly confused and EMS noted deficits however here patient is awake, alert, oriented. He can  answer all questions appropriately on exam. He has normal speech. He has no focal neurologic deficits on exam but does complain of a right-sided headache and feeling slightly dizzy. The dizziness seems to be positional worse when he moves his head and when he tries to sit up. Denies any recent medication changes and is still taking his HIV antirejection meds and hypertensive meds.  He denied any chest pain, shortness of breath, palpitations, abdominal pain, vomiting. He has not eaten today.  EKG with new T wave inversion laterally compared to an EKG within the last year. CBC, CMP without acute findings.  Troponin negative. Patient denies any drug or alcohol use for the last 90 days.  Given patient's risk factors, new EKG changes feel that he warrants admission for syncope workup.    Blanchie Dessert, MD 11/19/14 [id number]

## 2014-11-19 NOTE — ED Notes (Addendum)
EDP at bedside pt speech seems to be improving- pt answering questions more easily and more clear

## 2014-11-19 NOTE — ED Notes (Signed)
Pt wife sts that his speech does not sound right,.

## 2014-11-19 NOTE — H&P (Signed)
Date: 11/19/2014               Patient Name:  Jacob Joseph MRN: [id number]  DOB: [birth year] Age / Sex: 59 y.o., male   PCP: Jessee Avers, MD         Medical Service: Internal Medicine Teaching Service         Attending Physician: Dr. Sid Falcon, MD    First Contact: Reynaldo Minium Pager: -  Second Contact: Dr. Natasha Bence Pager: [phone]       After Hours (After 5p/  First Contact Pager: [phone]  weekends / holidays): Second Contact Pager: [phone]   Chief Complaint: Syncope   History of Present Illness:   Patient is a 59 year old male with a history of HIV, hepatitis C, COPD, hypertension, type 2 diabetes, depression who presented to the ED for an episode of loss of consciousness. Patient states that he was pushing his shopping cart in the parking lot of a grocery store when he started feeling dizzy. He stated that he felt the parking lot was spinning around him and that his vision was getting dark. He states that he remembers hitting the ground but does not remember specifically if he had hit any particular part of his body on the ground. His wife states that there was a period of 10-15 seconds when the patient was on the ground and staring off into space and was not responsive. She denies any jerking movements in the patient's extremities. Patient denies any associated tongue biting or bowel/bladder incontinence. He states that he did not have a period of confusion after this. His wife and he states that he may have had a period of time up until he presented to the emergency department when his speech may have been a bit slurred. Upon interview, patient states that he is return to his normal baseline. Patient reports a CVA from 2013 with no residual deficits. Patient states that he has generally been eating and drinking well.  Otherwise, patient denies any fevers, chills, chest pain, dyspnea, palpitations, abdominal pain, nausea, vomiting, constipation, diarrhea, dysuria, or  hematuria.   Meds: Medications Prior to Admission  Medication Sig Dispense Refill  . ADVAIR DISKUS 100-50 MCG/DOSE AEPB Inhale 1 puff into the lungs 2 (two) times daily.  6  . albuterol (PROVENTIL HFA;VENTOLIN HFA) 108 (90 BASE) MCG/ACT inhaler Inhale 2 puffs into the lungs every 6 (six) hours as needed for wheezing or shortness of breath. 1 Inhaler 2  . cephALEXin (KEFLEX) 500 MG capsule Take 1 capsule (500 mg total) by mouth 4 (four) times daily. 40 capsule 0  . citalopram (CELEXA) 20 MG tablet Take 20 mg by mouth daily.    . Darunavir Ethanolate (PREZISTA) 800 MG tablet Take 1 tablet (800 mg total) by mouth daily with breakfast. 30 tablet 11  . elvitegravir-cobicistat-emtricitabine-tenofovir (GENVOYA) 150-150-200-10 MG TABS tablet Take 1 tablet by mouth daily with breakfast. 30 tablet 11  . hydroxypropyl methylcellulose / hypromellose (ISOPTO TEARS / GONIOVISC) 2.5 % ophthalmic solution Place 1 drop into both eyes as needed for dry eyes.    . hydrOXYzine (VISTARIL) 50 MG capsule Take 50 mg by mouth daily.    [name] Kitchen lisinopril (PRINIVIL,ZESTRIL) 20 MG tablet Take 1 tablet (20 mg total) by mouth daily. 30 tablet 3  . metoprolol tartrate (LOPRESSOR) 25 MG tablet Take 1 tablet (25 mg total) by mouth 2 (two) times daily. 60 tablet 11  . mometasone-formoterol (DULERA) 100-5 MCG/ACT AERO Inhale 2 puffs into the lungs 2 (  two) times daily. 2 Inhaler 6  . SEROQUEL XR 300 MG 24 hr tablet Take 300 mg by mouth at bedtime.    [name] Kitchen SPIRIVA HANDIHALER 18 MCG inhalation capsule INHALE THE CONTENTS OF 1 CASPSULE USING HANDIHALER EVERY DAY 30 capsule 12  . terbinafine (LAMISIL) 250 MG tablet Take 1 tablet (250 mg total) by mouth daily. 42 tablet 0  . valACYclovir (VALTREX) 500 MG tablet Take 1 tablet (500 mg total) by mouth 2 (two) times daily. (Patient taking differently: Take 500 mg by mouth daily. ) 180 tablet 3  . zolpidem (AMBIEN) 10 MG tablet Take 10 mg by mouth at bedtime as needed. For sleep    . glipiZIDE  (GLUCOTROL) 5 MG tablet Take 1 tablet (5 mg total) by mouth daily before breakfast. (Patient not taking: Reported on 11/19/2014) 60 tablet 2  . Ledipasvir-Sofosbuvir (HARVONI) 90-400 MG TABS Take 1 tablet by mouth daily with breakfast. (Patient not taking: Reported on 11/19/2014) 28 tablet 2  . miconazole (MICATIN) 2 % cream Apply 1 application topically 2 (two) times daily. Apply to affected areas (Patient not taking: Reported on 09/10/2014) 28.35 g 0  . PREZISTA 800 MG tablet TAKE 1 TABLET BY MOUTH EVERY DAY WITH BREAKFAST (Patient not taking: Reported on 11/19/2014) 30 tablet 0   Current Facility-Administered Medications  Medication Dose Route Frequency Provider Last Rate Last Dose  . 0.9 %  sodium chloride infusion   Intravenous Continuous Jones Bales, MD      . albuterol (PROVENTIL) (2.5 MG/3ML) 0.083% nebulizer solution 5 mg  5 mg Nebulization Q6H PRN Jones Bales, MD      . aspirin EC tablet 325 mg  325 mg Oral Daily Jones Bales, MD      . cephALEXin (KEFLEX) capsule 500 mg  500 mg Oral QID Jones Bales, MD      . Derrill Memo ON 11/20/2014] citalopram (CELEXA) tablet 20 mg  20 mg Oral Daily Jones Bales, MD      . Derrill Memo ON 11/20/2014] Darunavir Ethanolate (PREZISTA) tablet 800 mg  800 mg Oral Q breakfast Jones Bales, MD      . Derrill Memo ON 11/20/2014] elvitegravir-cobicistat-emtricitabine-tenofovir (GENVOYA) 150-150-200-10 MG tablet 1 tablet  1 tablet Oral Q breakfast Jones Bales, MD      . enoxaparin (LOVENOX) injection 40 mg  40 mg Subcutaneous Q24H Jones Bales, MD      . hydroxypropyl methylcellulose / hypromellose (ISOPTO TEARS / GONIOVISC) 2.5 % ophthalmic solution 1 drop  1 drop Both Eyes PRN Jones Bales, MD      . Derrill Memo ON 11/20/2014] lisinopril (PRINIVIL,ZESTRIL) tablet 20 mg  20 mg Oral Daily Jones Bales, MD      . Derrill Memo ON 11/20/2014] metoprolol tartrate (LOPRESSOR) tablet 25 mg  25 mg Oral BID Jones Bales, MD      . mometasone-formoterol (DULERA)  100-5 MCG/ACT inhaler 2 puff  2 puff Inhalation BID Jones Bales, MD      . QUEtiapine (SEROQUEL XR) 24 hr tablet 200 mg  200 mg Oral QHS Jones Bales, MD      . terbinafine (LAMISIL) tablet 250 mg  250 mg Oral Daily Jones Bales, MD      . Derrill Memo ON 11/20/2014] tiotropium (SPIRIVA) inhalation capsule 18 mcg  18 mcg Inhalation Daily Jones Bales, MD      . Derrill Memo ON 11/20/2014] valACYclovir (VALTREX) tablet 500 mg  500 mg Oral Daily Jones Bales, MD  Past Medical History  Diagnosis Date  . Hypertension   . HIV (human immunodeficiency virus infection)   . Depression   . Stroke 11/2011    Carotids Doppler negative. Right sided weakness resolved, initially on Plavix for 3 months and then continued with ASA.    . TB lung, latent 1988    Treated  . Calcium oxalate crystals in urine 12/23/2011    Asymptomatic, no hematuria. Advised to take plenty of water.  . Hepatitis C     Past Surgical History  Procedure Laterality Date  . Tee without cardioversion  12/07/2011    Procedure: TRANSESOPHAGEAL ECHOCARDIOGRAM (TEE);  Surgeon: Birdie Riddle, MD;  Location: [place];  Service: Cardiovascular;  Laterality: N/A;  . Inguinal hernia repair  01/16/2012    Procedure: HERNIA REPAIR INGUINAL INCARCERATED;  Surgeon: Zenovia Jarred, MD;  Location: [place];  Service: General;  Laterality: Right;  . Hernia repair       Allergies: Allergies as of 11/19/2014 - Review Complete 11/19/2014  Allergen Reaction Noted  . Statins Other (See Comments) 03/14/2012  . Metformin and related Diarrhea 10/23/2013    Family History  Problem Relation Age of Onset  . Hypertension Mother   . Hypertension Father   . Cancer Father     History   Social History  . Marital Status: Married    Spouse Name: N/A  . Number of Children: N/A  . Years of Education: N/A   Occupational History  . Not on file.   Social History Main Topics  . Smoking status: Current Every Day Smoker -- 0.50  packs/day for 41 years    Types: Cigarettes  . Smokeless tobacco: Never Used     Comment: 1/2 PPD  . Alcohol Use: No  . Drug Use: 1.00 per week    Special: "Crack" cocaine, Cocaine, Marijuana     Comment: still using "crack" and marijuana about once a week, trying to stop again  . Sexual Activity:    Partners: Female     Comment: declined condoms   Other Topics Concern  . Not on file   Social History Narrative   Pt lives with wife and grandkid in [place].   Has applied for disability and is pending.   Worked in [place] before about 3 years ago.           Review of Systems: All pertinent ROS as stated in HPI.   Physical Exam: Blood pressure 145/85, pulse 75, temperature 98 F (36.7 C), temperature source Oral, resp. rate 14, SpO2 98 %. General: resting in bed HEENT: PERRL, EOMI, no scleral icterus Cardiac: RRR, no rubs, murmurs or gallops Pulm: clear to auscultation bilaterally, moving normal volumes of air Abd: soft, nontender, nondistended, BS present Ext: warm and well perfused, no pedal edema Neuro: alert and oriented X3  - cranial nerves II through XII intact  - motor: Strength 5+ throughout all 4 extremities  - Sensation: Intact to light touch throughout all 4 extremities  - Reflexes: 1+ patellar reflex on left, otherwise 2+ deep tendon reflexes throughout all 4 extremities  - cerebellar: Finger to nose and heel to shin intact, negative Romberg sign, no pronator drift  - Gait: Normal gait Skin: no rashes or lesions noted Psych: appropriate affect   Lab results: Basic Metabolic Panel:  Recent Labs  11/19/14 1607  NA 137  K 4.4  CL 108  CO2 20*  GLUCOSE 93  BUN 16  CREATININE 1.18  CALCIUM 9.0   Liver  Function Tests:  Recent Labs  11/19/14 1607  AST 23  ALT 15*  ALKPHOS 87  BILITOT 0.7  PROT 8.3*  ALBUMIN 3.4*   No results for input(s): LIPASE, AMYLASE in the last 72 hours. No results for input(s): AMMONIA in the last 72  hours. CBC:  Recent Labs  11/19/14 1607  WBC 7.6  HGB 13.8  HCT 41.8  MCV 86.7  PLT 193   Cardiac Enzymes:  Recent Labs  11/19/14 1607  TROPONINI <0.03   BNP: No results for input(s): PROBNP in the last 72 hours. D-Dimer: No results for input(s): DDIMER in the last 72 hours. CBG:  Recent Labs  11/19/14 1632  GLUCAP 90   Hemoglobin A1C: No results for input(s): HGBA1C in the last 72 hours. Fasting Lipid Panel: No results for input(s): CHOL, HDL, LDLCALC, TRIG, CHOLHDL, LDLDIRECT in the last 72 hours. Thyroid Function Tests: No results for input(s): TSH, T4TOTAL, FREET4, T3FREE, THYROIDAB in the last 72 hours. Anemia Panel: No results for input(s): VITAMINB12, FOLATE, FERRITIN, TIBC, IRON, RETICCTPCT in the last 72 hours. Coagulation: No results for input(s): LABPROT, INR in the last 72 hours. Urine Drug Screen: Drugs of Abuse     Component Value Date/Time   LABOPIA NONE DETECTED 11/19/2014 1650   LABOPIA NEG 05/31/2013 1625   COCAINSCRNUR NONE DETECTED 11/19/2014 1650   COCAINSCRNUR NEG 05/31/2013 1625   LABBENZ NONE DETECTED 11/19/2014 1650   LABBENZ NEG 05/31/2013 1625   LABBENZ NEGATIVE 03/09/2012 0741   AMPHETMU NONE DETECTED 11/19/2014 1650   AMPHETMU NEG 05/31/2013 1625   AMPHETMU NEGATIVE 03/09/2012 0741   THCU NONE DETECTED 11/19/2014 1650   THCU NEG 05/31/2013 1625   LABBARB NONE DETECTED 11/19/2014 1650   LABBARB NEG 05/31/2013 1625    Alcohol Level: No results for input(s): ETH in the last 72 hours. Urinalysis:  Recent Labs  11/19/14 1650  COLORURINE YELLOW  LABSPEC 1.023  PHURINE 5.0  GLUCOSEU >1000*  HGBUR NEGATIVE  BILIRUBINUR NEGATIVE  KETONESUR 15*  PROTEINUR NEGATIVE  UROBILINOGEN 1.0  NITRITE NEGATIVE  LEUKOCYTESUR NEGATIVE   Imaging results:  Dg Chest 2 View  11/19/2014   CLINICAL DATA:  Syncope, headache  EXAM: CHEST  2 VIEW  COMPARISON:  Five/ 2/15  FINDINGS: Cardiomediastinal silhouette is stable. No acute infiltrate  or pleural effusion. No pulmonary edema. Stable bilateral basilar atelectasis or scarring.  IMPRESSION: No active cardiopulmonary disease.   Electronically Signed   By: Lahoma Crocker M.D.   On: 11/19/2014 17:08   Ct Head Wo Contrast  11/19/2014   CLINICAL DATA:  Altered mental status.  Fall.  EXAM: CT HEAD WITHOUT CONTRAST  TECHNIQUE: Contiguous axial images were obtained from the base of the skull through the vertex without intravenous contrast.  COMPARISON:  MRI 03/09/2012.  FINDINGS: No mass lesion, mass effect, midline shift, hydrocephalus, hemorrhage. No acute territorial cortical ischemia/infarct. Atrophy and chronic ischemic white matter disease is present. Carotid atherosclerosis is present. Mild paranasal sinus disease.  IMPRESSION: Atrophy and chronic ischemic white matter disease without acute intracranial abnormality.   Electronically Signed   By: Dereck Ligas M.D.   On: 11/19/2014 18:22    Other results: EKG Interpretation  Date/Time:  Monday Nov 19 2014 15:36:39 EDT Ventricular Rate:  74 PR Interval:  157 QRS Duration: 101 QT Interval:  413 QTC Calculation: 458 R Axis:   -142 Text Interpretation:  Sinus or ectopic atrial rhythm RVH with secondary repolarization abnrm new Abnormal T, consider ischemia, lateral leads Confirmed by [facility]  MD, Loree Fee ([id number]) on 11/19/2014 4:36:32 PM   Assessment & Plan by Problem: Principal Problem:   Syncope Active Problems:   Human immunodeficiency virus (HIV) disease   Polysubstance abuse   Essential hypertension   COPD, moderate   Diabetes mellitus, type 2  Patient is a 59 year old male with a history of HIV, hepatitis C, COPD, hypertension, type 2 diabetes, depression who is admitted for syncope.  Syncope: Patient presenting with an episode of loss consciousness that was associated with vertigo and slurred speech, which have now resolved. TIA is a possibility given the association with vertigo and reported dysarthria. Patient not found  to have orthostatic hypotension upon initial evaluation in the emergency department. However, patient is on a high dose of Seroquel which has a 2-7% incidence of orthostatic hypotension. Patient does not have a history of structural heart disease from a last echocardiogram from 2013 which showed ejection fraction of 60-65% and grade 1 diastolic dysfunction. Drug intoxication is less likely given a negative urine drug screen. Hypoglycemia is less likely given normal glycemia upon initial evaluation and the fact that patient has not been taking his glipizide. Myocardial ischemia is less likely given absence of chest pain and troponin negative 1 although there are noted lateral T-wave inversions on EKG. CT head was negative for any acute intracranial abnormalities status post fall.  Pulmonary hypertension is less likely given lack of exertional symptoms besides this one isolated episode, although repeat evaluation may be warranted given a peak pulmonary pressure of 37 mmHg in 2013. -Consult neurology -MRI/MRA brain -Echocardiogram -Carotid Dopplers -EKG in the morning -Continue to trend troponins 2 -75 mL per hour normal saline -Aspirin 325 mg, continue aspirin 81 mg afterwards (unclear why this was discontinued in the past) -Admit to telemetry  -Vestibular physical therapy  HIV: At home, patient is on Genvoya and [place]. Patient's last viral load undetectable. Last CD4 of 400 on April 2016. -Continue home regimen  COPD: At home, patient has a albuterol inhaler, Dulera, and Spiriva.  -Albuterol nebulizer as needed  -Continue home Dulera and Spiriva   Hypertension: Patient has been normotensive since his initial evaluation. At home, patient is on metoprolol 25 mg twice a day, lisinopril 20 mg daily -Hold home regimen for permissive hypertension.  Sialadenitis:  patient initially presented to the emergency department on 11/15/2014 with symptoms concerning for celiac adenitis. -Continue 10 day  course of Keflex (started on 11/15/2014).   Type 2 diabetes: Patient has been relatively normoglycemic upon initial evaluation. At home, patient is on glipizide 5 mg daily (started in April 2016). Patient's last A1c was 6.6 in April 2016.  -Consider adding sliding scale insulin if hyperglycemic.  Depression: At home, patient is on Seroquel 150 mg daily at bedtime. There is a note from PCP as soon some unclarity about patient's regimen. Uncertain when patient started Seroquel. Patient also concurrently on citalopram. -Continue home Seroquel at reduced dose of 200 mg daily at bedtime.   Polysubstance abuse:  Urine drug screen negative. Patient has a history of cocaine and alcohol use.  -Encourage continued cessation   Hepatitis C: Patient completed treatment with [facility] on 10/31/2014.  History of CVA: Stroke in 2013. No residual deficits. Patient had completed a 3 month course of Plavix and was placed on aspirin afterwards. It is unclear how his aspirin has been discontinued since. -Consider adding aspirin 81 mg daily  Diet: Heart healthy DVT Prophylaxis:  Lovenox 40 mg daily  Code: Full   Dispo: Disposition is deferred at  this time, awaiting improvement of current medical problems. Anticipated discharge in approximately 2 day(s).   The patient does have a current PCP Jessee Avers, MD) and does need an [facility] hospital follow-up appointment after discharge.  The patient does not have transportation limitations that hinder transportation to clinic appointments.  Signed: Luan Moore, M.D., Ph.D. Internal Medicine Teaching Service, PGY-1 11/19/2014, 9:41 PM

## 2014-11-19 NOTE — ED Notes (Signed)
Pt transported to radiology.

## 2014-11-19 NOTE — ED Notes (Signed)
Per ems- called out for fall at [facility]. Upon ems arrival pt lethargic but oriented. Pt sx worsening throughout transport. Possible periodic R sided gaze. Pt c.o headache to R side of head. Positive loc. Pt in c-[facility] 93. Pt with slurred speech. Pt sts that he got dizzy prior to falling.

## 2014-11-19 NOTE — ED Notes (Signed)
Made pt aware of bed assignment 

## 2014-11-20 ENCOUNTER — Observation Stay (HOSPITAL_COMMUNITY): Payer: Medicare Other

## 2014-11-20 DIAGNOSIS — J449 Chronic obstructive pulmonary disease, unspecified: Secondary | ICD-10-CM

## 2014-11-20 DIAGNOSIS — I10 Essential (primary) hypertension: Secondary | ICD-10-CM | POA: Diagnosis present

## 2014-11-20 DIAGNOSIS — B1920 Unspecified viral hepatitis C without hepatic coma: Secondary | ICD-10-CM | POA: Diagnosis present

## 2014-11-20 DIAGNOSIS — Z21 Asymptomatic human immunodeficiency virus [HIV] infection status: Secondary | ICD-10-CM

## 2014-11-20 DIAGNOSIS — B1710 Acute hepatitis C without hepatic coma: Secondary | ICD-10-CM | POA: Diagnosis not present

## 2014-11-20 DIAGNOSIS — A6000 Herpesviral infection of urogenital system, unspecified: Secondary | ICD-10-CM | POA: Diagnosis not present

## 2014-11-20 DIAGNOSIS — I6340 Cerebral infarction due to embolism of unspecified cerebral artery: Secondary | ICD-10-CM | POA: Diagnosis not present

## 2014-11-20 DIAGNOSIS — R55 Syncope and collapse: Secondary | ICD-10-CM

## 2014-11-20 DIAGNOSIS — B20 Human immunodeficiency virus [HIV] disease: Secondary | ICD-10-CM | POA: Diagnosis not present

## 2014-11-20 DIAGNOSIS — G4700 Insomnia, unspecified: Secondary | ICD-10-CM | POA: Diagnosis present

## 2014-11-20 DIAGNOSIS — Z23 Encounter for immunization: Secondary | ICD-10-CM | POA: Diagnosis not present

## 2014-11-20 DIAGNOSIS — E119 Type 2 diabetes mellitus without complications: Secondary | ICD-10-CM | POA: Diagnosis present

## 2014-11-20 DIAGNOSIS — Z8673 Personal history of transient ischemic attack (TIA), and cerebral infarction without residual deficits: Secondary | ICD-10-CM | POA: Diagnosis not present

## 2014-11-20 DIAGNOSIS — K1120 Sialoadenitis, unspecified: Secondary | ICD-10-CM

## 2014-11-20 DIAGNOSIS — B351 Tinea unguium: Secondary | ICD-10-CM | POA: Diagnosis not present

## 2014-11-20 DIAGNOSIS — F17210 Nicotine dependence, cigarettes, uncomplicated: Secondary | ICD-10-CM | POA: Diagnosis present

## 2014-11-20 DIAGNOSIS — F1410 Cocaine abuse, uncomplicated: Secondary | ICD-10-CM | POA: Diagnosis not present

## 2014-11-20 DIAGNOSIS — R4701 Aphasia: Secondary | ICD-10-CM | POA: Diagnosis not present

## 2014-11-20 DIAGNOSIS — F329 Major depressive disorder, single episode, unspecified: Secondary | ICD-10-CM | POA: Diagnosis not present

## 2014-11-20 DIAGNOSIS — F1210 Cannabis abuse, uncomplicated: Secondary | ICD-10-CM

## 2014-11-20 DIAGNOSIS — I639 Cerebral infarction, unspecified: Secondary | ICD-10-CM | POA: Diagnosis not present

## 2014-11-20 DIAGNOSIS — E785 Hyperlipidemia, unspecified: Secondary | ICD-10-CM | POA: Diagnosis present

## 2014-11-20 DIAGNOSIS — F419 Anxiety disorder, unspecified: Secondary | ICD-10-CM | POA: Diagnosis present

## 2014-11-20 LAB — LIPID PANEL
CHOL/HDL RATIO: 5.8 ratio
CHOLESTEROL: 169 mg/dL (ref 0–200)
HDL: 29 mg/dL — ABNORMAL LOW (ref >40)
LDL Cholesterol: 112 mg/dL — ABNORMAL HIGH (ref 0–99)
Triglycerides: 142 mg/dL (ref <150)
VLDL: 28 mg/dL (ref 0–40)

## 2014-11-20 LAB — BASIC METABOLIC PANEL
Anion gap: 11 (ref 5–15)
BUN: 17 mg/dL (ref 6–20)
CO2: 20 mmol/L — ABNORMAL LOW (ref 22–32)
CREATININE: 1.09 mg/dL (ref 0.61–1.24)
Calcium: 8.8 mg/dL — ABNORMAL LOW (ref 8.9–10.3)
Chloride: 106 mmol/L (ref 101–111)
GFR calc non Af Amer: >60 mL/min (ref >60)
Glucose, Bld: 105 mg/dL — ABNORMAL HIGH (ref 70–99)
POTASSIUM: 3.9 mmol/L (ref 3.5–5.1)
Sodium: 137 mmol/L (ref 135–145)

## 2014-11-20 LAB — GLUCOSE, CAPILLARY: Glucose-Capillary: 115 mg/dL — ABNORMAL HIGH (ref 70–99)

## 2014-11-20 LAB — TROPONIN I
Troponin I: <0.03 ng/mL (ref <0.031)
Troponin I: <0.03 ng/mL (ref <0.031)

## 2014-11-20 MED ORDER — ASPIRIN EC 81 MG PO TBEC
81.0000 mg | DELAYED_RELEASE_TABLET | Freq: Every day | ORAL | Status: DC
  Administered 2014-11-21 – 2014-11-22 (×2): 81 mg via ORAL
  Filled 2014-11-20 (×3): qty 1

## 2014-11-20 MED ORDER — STROKE: EARLY STAGES OF RECOVERY BOOK
Freq: Once | Status: AC
  Administered 2014-11-20: 22:00:00
  Filled 2014-11-20 (×2): qty 1

## 2014-11-20 NOTE — Procedures (Signed)
ELECTROENCEPHALOGRAM REPORT   Patient: Jacob Joseph      Room #: 4Y-65 Age: 59 y.o.        Sex: male Referring Physician: Dr Raelene Bott Report Date:  11/20/2014        Interpreting Physician: Hulen Luster  History: Andrews Tener is an 59 y.o. male admitted with transient episode of vertigo and dysarthria. Found to have CVA on MRI.   Medications:  Scheduled: .  stroke: mapping our early stages of recovery book   Does not apply Once  . aspirin EC  325 mg Oral Daily  . cephALEXin  500 mg Oral QID  . citalopram  20 mg Oral Daily  . Darunavir Ethanolate  800 mg Oral Q breakfast  . elvitegravir-cobicistat-emtricitabine-tenofovir  1 tablet Oral Q breakfast  . enoxaparin (LOVENOX) injection  40 mg Subcutaneous Q24H  . mometasone-formoterol  2 puff Inhalation BID  . QUEtiapine  200 mg Oral QHS  . terbinafine  250 mg Oral Daily  . tiotropium  18 mcg Inhalation Daily  . valACYclovir  500 mg Oral Daily    Conditions of Recording:  This is a 16 channel EEG carried out with the patient in the drowsy state.  Description:  The waking background activity consists of a low voltage, symmetrical, fairly well organized, 8-9 Hz alpha activity, with frequent low voltage theta activity seen from the parieto-occipital and posterior temporal regions.  Low voltage fast activity, poorly organized, is seen anteriorly and is at times superimposed on more posterior regions.  A mixture of theta and alpha rhythms are seen from the central and temporal regions. No focal slowing or epileptiform activity is noted.   The patient drowses with slowing to irregular, low voltage theta and beta activity.  Normal sleep architecture is not observed.   Hyperventilation and intermittent photic stimulation was not performed.   IMPRESSION: Normal drowsy electroencephalogram. There are no focal lateralizing or epileptiform features.   Jim Like, DO [facility] [phone]  If 7pm- 7am, please page  neurology on call as listed in AMION. 11/20/2014, 11:39 AM

## 2014-11-20 NOTE — Evaluation (Signed)
Physical Therapy Evaluation Patient Details Name: Jacob Joseph MRN: [id number] DOB: 1956/05/07 Today's Date: 11/20/2014   History of Present Illness  Adm s/p syncopal spell (walking in parking lot, began to feel weak, things began to spin and vision darkened and fell). Orthostatic BPs negative, CT head negative; non-focal exam  PMHx- CVA, HIV, Hep C, COPD, DM, HTN, polysubstance abuse    Clinical Impression  Patient evaluated by Physical Therapy with no further acute PT needs identified. Orhtostatic BPs repeated and negative. Denies any vertigo since episode. All education has been completed and the patient has no further questions. PT is signing off. Thank you for this referral.     Follow Up Recommendations No PT follow up    Equipment Recommendations  None recommended by PT    Recommendations for Other Services       Precautions / Restrictions Precautions Precautions: Fall Precaution Comments: reports last fell 2 years prior (lost his balance) Restrictions Weight Bearing Restrictions: No      Mobility  Bed Mobility                  Transfers Overall transfer level: Independent Equipment used: None                Ambulation/Gait Ambulation/Gait assistance: Field seismologist (Feet): 150 Feet Assistive device: None Gait Pattern/deviations: WFL(Within Functional Limits)   Gait velocity interpretation: Below normal speed for age/gender General Gait Details: no deviations; slow cadence (normal for him per pt)  Stairs            Wheelchair Mobility    Modified Rankin (Stroke Patients Only)       Balance Overall balance assessment: History of Falls                               Standardized Balance Assessment Standardized Balance Assessment : Dynamic Gait Index   Dynamic Gait Index Level Surface: Normal Change in Gait Speed: Moderate Impairment Gait with Horizontal Head Turns: Normal Gait with  Vertical Head Turns: Normal Gait and Pivot Turn: Normal       Pertinent Vitals/Pain See vitals flow sheet.   Pain Assessment: No/denies pain    Home Living Family/patient expects to be discharged to:: Private residence Living Arrangements: Spouse/significant other Available Help at Discharge: Family;Available 24 hours/day Type of Home: Apartment Home Access: Stairs to enter Entrance Stairs-Rails: Right;Left;Can reach both Entrance Stairs-Number of Steps: 2 Home Layout: One level Home Equipment: Bedside commode;Walker - 2 wheels;Shower seat;Cane - single point      Prior Function Level of Independence: Independent with assistive device(s)         Comments: uses BSC over toilet and shower chair PTA; walking independently     Hand Dominance        Extremity/Trunk Assessment   Upper Extremity Assessment: Overall WFL for tasks assessed;Defer to OT evaluation           Lower Extremity Assessment: Overall WFL for tasks assessed      Cervical / Trunk Assessment: Normal  Communication   Communication: No difficulties (states back to his baseline)  Cognition Arousal/Alertness: Awake/alert Behavior During Therapy: Flat affect Overall Cognitive Status: Within Functional Limits for tasks assessed                      General Comments      Exercises        Assessment/Plan    PT  Assessment Patent does not need any further PT services  PT Diagnosis Difficulty walking   PT Problem List    PT Treatment Interventions     PT Goals (Current goals can be found in the Care Plan section) Acute Rehab PT Goals PT Goal Formulation: All assessment and education complete, DC therapy    Frequency     Barriers to discharge        Co-evaluation               End of Session Equipment Utilized During Treatment: Gait belt Activity Tolerance: Patient tolerated treatment well Patient left: in chair;with call bell/phone within reach Nurse Communication:  Mobility status    Functional Assessment Tool Used: clinical judgement Functional Limitation: Mobility: Walking and moving around Mobility: Walking and Moving Around Current Status ([id number]): At least 1 percent but less than 20 percent impaired, limited or restricted Mobility: Walking and Moving Around Goal Status [phone]): At least 1 percent but less than 20 percent impaired, limited or restricted Mobility: Walking and Moving Around Discharge Status [phone]): At least 1 percent but less than 20 percent impaired, limited or restricted    Time: 0857-0918 PT Time Calculation (min) (ACUTE ONLY): 21 min   Charges:   PT Evaluation $Initial PT Evaluation Tier I: 1 Procedure     PT G Codes:   PT G-Codes **NOT FOR INPATIENT CLASS** Functional Assessment Tool Used: clinical judgement Functional Limitation: Mobility: Walking and moving around Mobility: Walking and Moving Around Current Status ([id number]): At least 1 percent but less than 20 percent impaired, limited or restricted Mobility: Walking and Moving Around Goal Status [phone]): At least 1 percent but less than 20 percent impaired, limited or restricted Mobility: Walking and Moving Around Discharge Status [phone]): At least 1 percent but less than 20 percent impaired, limited or restricted    Nekesha Font 11/20/2014, 9:32 AM  Pager [phone]

## 2014-11-20 NOTE — Progress Notes (Signed)
Routine EEG completed, results pending. 

## 2014-11-20 NOTE — Progress Notes (Signed)
UR completed 

## 2014-11-20 NOTE — Progress Notes (Addendum)
Subjective: Patient says he is feeling well today, no confusion, weakness, dizziness, lightheadedness, palpitations, or SOB.   Objective: Vital signs in last 24 hours: Filed Vitals:   11/20/14 0749 11/20/14 0900 11/20/14 0904 11/20/14 0912  BP:  122/78 128/74 125/83  Pulse:  80 92 96  Temp: 98.1 F (36.7 C)     TempSrc: Oral     Resp:      Height:      Weight:      SpO2:       Weight change:   Intake/Output Summary (Last 24 hours) at 11/20/14 1310 Last data filed at 11/20/14 0500  Gross per 24 hour  Intake      0 ml  Output    450 ml  Net   -450 ml   Physical Exam: General: AA male, alert, cooperative, NAD. HEENT: PERRL, EOMI. Moist mucus membranes Neck: Full range of motion without pain, supple, no lymphadenopathy or carotid bruits Lungs: Clear to ascultation bilaterally, normal work of respiration, no wheezes, rales, rhonchi Heart: RRR, no murmurs, gallops, or rubs Abdomen: Soft, non-tender, non-distended, BS + Extremities: No cyanosis, clubbing, or edema Neurologic: Alert & oriented x3, cranial nerves II-XII intact, strength grossly intact, sensation intact to light touch   Lab Results: Basic Metabolic Panel:  Recent Labs Lab 11/19/14 1607 11/20/14 0311  NA 137 137  K 4.4 3.9  CL 108 106  CO2 20* 20*  GLUCOSE 93 105*  BUN 16 17  CREATININE 1.18 1.09  CALCIUM 9.0 8.8*   Liver Function Tests:  Recent Labs Lab 11/19/14 1607  AST 23  ALT 15*  ALKPHOS 87  BILITOT 0.7  PROT 8.3*  ALBUMIN 3.4*   CBC:  Recent Labs Lab 11/19/14 1607  WBC 7.6  HGB 13.8  HCT 41.8  MCV 86.7  PLT 193   Cardiac Enzymes:  Recent Labs Lab 11/19/14 2130 11/20/14 0311 11/20/14 0927  TROPONINI <0.03 <0.03 <0.03   CBG:  Recent Labs Lab 11/19/14 1632  GLUCAP 90   Hemoglobin A1C: No results for input(s): HGBA1C in the last 168 hours. Fasting Lipid Panel: No results for input(s): CHOL, HDL, LDLCALC, TRIG, CHOLHDL, LDLDIRECT in the last 168 hours.  Urine  Drug Screen: Drugs of Abuse     Component Value Date/Time   LABOPIA NONE DETECTED 11/19/2014 1650   LABOPIA NEG 05/31/2013 1625   COCAINSCRNUR NONE DETECTED 11/19/2014 1650   COCAINSCRNUR NEG 05/31/2013 1625   LABBENZ NONE DETECTED 11/19/2014 1650   LABBENZ NEG 05/31/2013 1625   LABBENZ NEGATIVE 03/09/2012 0741   AMPHETMU NONE DETECTED 11/19/2014 1650   AMPHETMU NEG 05/31/2013 1625   AMPHETMU NEGATIVE 03/09/2012 0741   THCU NONE DETECTED 11/19/2014 1650   THCU NEG 05/31/2013 1625   LABBARB NONE DETECTED 11/19/2014 1650   LABBARB NEG 05/31/2013 1625    Urinalysis:  Recent Labs Lab 11/19/14 1650  COLORURINE YELLOW  LABSPEC 1.023  PHURINE 5.0  GLUCOSEU >1000*  HGBUR NEGATIVE  BILIRUBINUR NEGATIVE  KETONESUR 15*  PROTEINUR NEGATIVE  UROBILINOGEN 1.0  NITRITE NEGATIVE  LEUKOCYTESUR NEGATIVE   Studies/Results: Dg Chest 2 View  11/19/2014   CLINICAL DATA:  Syncope, headache  EXAM: CHEST  2 VIEW  COMPARISON:  Five/ 2/15  FINDINGS: Cardiomediastinal silhouette is stable. No acute infiltrate or pleural effusion. No pulmonary edema. Stable bilateral basilar atelectasis or scarring.  IMPRESSION: No active cardiopulmonary disease.   Electronically Signed   By: Lahoma Crocker M.D.   On: 11/19/2014 17:08   Ct Head Wo  Contrast  11/19/2014   CLINICAL DATA:  Altered mental status.  Fall.  EXAM: CT HEAD WITHOUT CONTRAST  TECHNIQUE: Contiguous axial images were obtained from the base of the skull through the vertex without intravenous contrast.  COMPARISON:  MRI 03/09/2012.  FINDINGS: No mass lesion, mass effect, midline shift, hydrocephalus, hemorrhage. No acute territorial cortical ischemia/infarct. Atrophy and chronic ischemic white matter disease is present. Carotid atherosclerosis is present. Mild paranasal sinus disease.  IMPRESSION: Atrophy and chronic ischemic white matter disease without acute intracranial abnormality.   Electronically Signed   By: Dereck Ligas M.D.   On: 11/19/2014 18:22     Mr Jodene Nam Head Wo Contrast  11/20/2014   CLINICAL DATA:  Initial evaluation for acute syncope, aphasia.  EXAM: MRI HEAD WITHOUT CONTRAST  MRA HEAD WITHOUT CONTRAST  TECHNIQUE: Multiplanar, multiecho pulse sequences of the brain and surrounding structures were obtained without intravenous contrast. Angiographic images of the head were obtained using MRA technique without contrast.  COMPARISON:  Prior CT from 11/19/2014 as well as previous MRI from 03/09/2012.  FINDINGS: MRI HEAD FINDINGS  Diffuse prominence of the CSF containing spaces is compatible with generalized cerebral atrophy. Patchy and confluent T2/FLAIR hyperintensity within the periventricular and deep white matter both cerebral hemispheres most consistent with chronic small vessel ischemic disease, mild to moderate nature.  Remote lacunar infarct present within the high and posterior left frontal lobe (series 8, image 23). Additional remote infarcts present within the periventricular white matter of the left corona radiata.  There are scattered foci of patchy restricted diffusion involving the cortical gray matter of the right parietal lobe (series 3, image 40, 36). Additional small cortical infarct present more anteriorly within the right frontal lobe (series 3, image 38, 34) there is patchy ischemic infarct involving the right caudate head (series 3, image 26). Additional infarct involving the right occipital lobe (series 3, image 19). Additional tiny possible infarct within the right middle cerebral peduncle (series 3, image 18). These are all likely within the right MCA territory distribution. No associated hemorrhage or significant mass effect.  No mass lesion or midline shift. No hydrocephalus. No extra-axial fluid collection.  Craniocervical junction within normal limits. Pituitary gland normal. No acute abnormality about the orbits.  Mild opacity seen layering within the sphenoid sinuses bilaterally. Paranasal sinuses are otherwise clear. No  mastoid effusion. Inner ear structures normal.  Bone marrow signal intensity within normal limits. Mild degenerative changes noted within the visualized upper cervical spine. Scalp soft tissues within normal limits.  MRA HEAD FINDINGS  ANTERIOR CIRCULATION:  Visualized distal cervical segments of the internal carotid arteries are widely patent with antegrade flow. The left ICA is somewhat diminutive as compared to the right. Petrous segments and cavernous segments are patent bilaterally. Again, there is occlusion of the supraclinoid left ICA, similar to prior. Supraclinoid right ICA is widely patent. There is a widely patent right A1 segment supplying both anterior cerebral arteries which are well opacified. Right M1 segment widely patent without stenosis or occlusion. MCA bifurcation on the right is normal. Distal MCA branches on the right are well opacified.  Left MCA artery and its branches are not visualize, stable from prior.  POSTERIOR CIRCULATION:  Both vertebral arteries are widely patent to the vertebrobasilar junction. Posterior inferior cerebral arteries not well evaluated on this exam. Basilar artery widely patent. Right anterior inferior cerebellar artery is dominant. Superior cerebellar arteries patent bilaterally. Right posterior cerebral artery widely patent. There is fetal origin of the left PCA with  widely patent left posterior communicating artery. This is supplied via the left ICA just prior to its occlusion at its supraclinoid segment. Left PCA is widely patent.  No aneurysm.  IMPRESSION: MRI HEAD IMPRESSION:  1. Patchy acute ischemic infarcts involving the cortical gray matter of the right frontal parietal lobes as well as the right occipital lobe, with additional infarct involving the right caudate head. These infarcts are in a right MCA territory distribution. No associated hemorrhage or significant mass effect. 2. Remote bilateral frontal lobe infarcts as detailed above. 3. Mild atrophy with  chronic small vessel ischemic disease.  MRA HEAD IMPRESSION:  1. Chronic occlusion of the supraclinoid left ICA with absent flow within the left middle cerebral artery and its branches distally. This is stable relative to previous MRA from 2013. 2. No new proximal branch occlusion or hemodynamically significant stenosis identified. 3. Anterior cerebral arteries supplied via a widely patent right A1 segment. 4. Fetal origin of the left PCA, supplied via the left internal carotid artery just prior to its occlusion at its supraclinoid segment.   Electronically Signed   By: Jeannine Boga M.D.   On: 11/20/2014 02:20   Mr Brain Wo Contrast  11/20/2014   CLINICAL DATA:  Initial evaluation for acute syncope, aphasia.  EXAM: MRI HEAD WITHOUT CONTRAST  MRA HEAD WITHOUT CONTRAST  TECHNIQUE: Multiplanar, multiecho pulse sequences of the brain and surrounding structures were obtained without intravenous contrast. Angiographic images of the head were obtained using MRA technique without contrast.  COMPARISON:  Prior CT from 11/19/2014 as well as previous MRI from 03/09/2012.  FINDINGS: MRI HEAD FINDINGS  Diffuse prominence of the CSF containing spaces is compatible with generalized cerebral atrophy. Patchy and confluent T2/FLAIR hyperintensity within the periventricular and deep white matter both cerebral hemispheres most consistent with chronic small vessel ischemic disease, mild to moderate nature.  Remote lacunar infarct present within the high and posterior left frontal lobe (series 8, image 23). Additional remote infarcts present within the periventricular white matter of the left corona radiata.  There are scattered foci of patchy restricted diffusion involving the cortical gray matter of the right parietal lobe (series 3, image 40, 36). Additional small cortical infarct present more anteriorly within the right frontal lobe (series 3, image 38, 34) there is patchy ischemic infarct involving the right caudate head  (series 3, image 26). Additional infarct involving the right occipital lobe (series 3, image 19). Additional tiny possible infarct within the right middle cerebral peduncle (series 3, image 18). These are all likely within the right MCA territory distribution. No associated hemorrhage or significant mass effect.  No mass lesion or midline shift. No hydrocephalus. No extra-axial fluid collection.  Craniocervical junction within normal limits. Pituitary gland normal. No acute abnormality about the orbits.  Mild opacity seen layering within the sphenoid sinuses bilaterally. Paranasal sinuses are otherwise clear. No mastoid effusion. Inner ear structures normal.  Bone marrow signal intensity within normal limits. Mild degenerative changes noted within the visualized upper cervical spine. Scalp soft tissues within normal limits.  MRA HEAD FINDINGS  ANTERIOR CIRCULATION:  Visualized distal cervical segments of the internal carotid arteries are widely patent with antegrade flow. The left ICA is somewhat diminutive as compared to the right. Petrous segments and cavernous segments are patent bilaterally. Again, there is occlusion of the supraclinoid left ICA, similar to prior. Supraclinoid right ICA is widely patent. There is a widely patent right A1 segment supplying both anterior cerebral arteries which are well opacified. Right M1  segment widely patent without stenosis or occlusion. MCA bifurcation on the right is normal. Distal MCA branches on the right are well opacified.  Left MCA artery and its branches are not visualize, stable from prior.  POSTERIOR CIRCULATION:  Both vertebral arteries are widely patent to the vertebrobasilar junction. Posterior inferior cerebral arteries not well evaluated on this exam. Basilar artery widely patent. Right anterior inferior cerebellar artery is dominant. Superior cerebellar arteries patent bilaterally. Right posterior cerebral artery widely patent. There is fetal origin of the left  PCA with widely patent left posterior communicating artery. This is supplied via the left ICA just prior to its occlusion at its supraclinoid segment. Left PCA is widely patent.  No aneurysm.  IMPRESSION: MRI HEAD IMPRESSION:  1. Patchy acute ischemic infarcts involving the cortical gray matter of the right frontal parietal lobes as well as the right occipital lobe, with additional infarct involving the right caudate head. These infarcts are in a right MCA territory distribution. No associated hemorrhage or significant mass effect. 2. Remote bilateral frontal lobe infarcts as detailed above. 3. Mild atrophy with chronic small vessel ischemic disease.  MRA HEAD IMPRESSION:  1. Chronic occlusion of the supraclinoid left ICA with absent flow within the left middle cerebral artery and its branches distally. This is stable relative to previous MRA from 2013. 2. No new proximal branch occlusion or hemodynamically significant stenosis identified. 3. Anterior cerebral arteries supplied via a widely patent right A1 segment. 4. Fetal origin of the left PCA, supplied via the left internal carotid artery just prior to its occlusion at its supraclinoid segment.   Electronically Signed   By: Jeannine Boga M.D.   On: 11/20/2014 02:20   Medications: I have reviewed the patient's current medications. Scheduled Meds: .  stroke: mapping our early stages of recovery book   Does not apply Once  . aspirin EC  325 mg Oral Daily  . cephALEXin  500 mg Oral QID  . citalopram  20 mg Oral Daily  . Darunavir Ethanolate  800 mg Oral Q breakfast  . elvitegravir-cobicistat-emtricitabine-tenofovir  1 tablet Oral Q breakfast  . enoxaparin (LOVENOX) injection  40 mg Subcutaneous Q24H  . mometasone-formoterol  2 puff Inhalation BID  . QUEtiapine  200 mg Oral QHS  . terbinafine  250 mg Oral Daily  . tiotropium  18 mcg Inhalation Daily  . valACYclovir  500 mg Oral Daily   Continuous Infusions:  PRN Meds:.acetaminophen,  albuterol, hydroxypropyl methylcellulose / hypromellose    Assessment/Plan: 59 y/o M w/ PMHx of HTN, HIV (last CD4 400, VL <20, 11/12/14), Hep C, h/o CVA in 2013, and depression, presented to the ED after an episode of syncope and dizziness, admitted for TIA workup.   Right-Sided Ischemic Stroke: Episode of syncope witnessed by significant other while at [employer], associated w/ slurred speech, dizziness, and confusion, all of which have resolved. Orthostatic on admission. Suspect syncope is related to orthostasis vs vasovagal event, however, MRI on admission showed patchy acute ischemic infarcts involving the cortical gray matter of the right frontal parietal lobes as well as the right occipital lobe, with additional infarct involving the right caudate head. All within right MCA distribution. No associated hemorrhage or mass effect. MRA was also significant for chronic occlusion of the supraclinoid left ICA with absent flow within the left MCA and its branches distally. This findings is stable compared to MRA in 2013. CA doppler w/ 1-39% stenosis bilaterally and antegrade flow in the vertebral arteries. EEG negative. Seizure  less likely at this time. Troponins negative.  -ECHO pending -Continue ASA 81 -Telemetry -PT/OT  HIV: At home, patient is on [name] and [place]. VL <20, CD4 400 in 10/2014.  -Continue home regimen  COPD: Stable.  -Albuterol neb prn -Continue Dulera and Spiriva   HTN: Stable. On metoprolol 25 mg twice a day, lisinopril 20 mg daily at home.  -Hold home meds for permissive HTN  Sialadenitis: Presented to the emergency department on 11/15/2014 with symptoms concerning for Sialadenitis.  -Continue 10 day course of Keflex (started on 11/15/2014).   DM type II: Normoglycemic upon initial evaluation. At home, patient is on glipizide 5 mg daily (started in April 2016). Patient's HbA1c was 6.6 in 10/2014. -Consider adding sliding scale insulin if hyperglycemic.  Depression: At  home, patient is on Seroquel 150 mg daily at bedtime. There is a note from PCP as soon some unclarity about patient's regimen. Uncertain when patient started Seroquel. Patient also concurrently on citalopram. -Continue home Seroquel at reduced dose of 200 mg daily at bedtime.  -Celexa 20 mg qd  Hepatitis C: Patient completed treatment with Harvoni on 10/31/2014.  Tinea Infection: Patient w/ fungal infection of finger and toe nails, started Terbinafine in clinic on 10/31/14 for this.  -Continue Terbinafine  Genital Herpes: Stable.  -Continue Valtrex  Dispo: Disposition is deferred at this time, awaiting improvement of current medical problems.  Anticipated discharge in approximately 1-2 day(s).   The patient does have a current PCP Jessee Avers, MD) and does need an [facility] hospital follow-up appointment after discharge.  The patient does not have transportation limitations that hinder transportation to clinic appointments.  .Services Needed at time of discharge: Y = Yes, Blank = No PT:   OT:   RN:   Equipment:   Other:     LOS: 1 day   Corky Sox, MD 11/20/2014, 1:10 PM

## 2014-11-20 NOTE — Progress Notes (Signed)
OT Cancellation Note  Patient Details Name: Jaxzen Vanhorn MRN: [id number] DOB: 06-21-1956   Cancelled Treatment:    Reason Eval/Treat Not Completed: Other (comment). Pt sleeping soundly. Woke up briefly and would like OT to return tomorrow.  Benito Mccreedy OTR/L [phone] 11/20/2014, 3:46 PM

## 2014-11-20 NOTE — Progress Notes (Signed)
*  PRELIMINARY RESULTS* Vascular Ultrasound Carotid Duplex (Doppler) has been completed.  Study was technically difficult due to patient anatomy and patient snoring. Findings suggest 1-39% internal carotid artery stenosis bilaterally. Vertebral arteries are patent with antegrade flow.  11/20/2014 11:45 AM Maudry Mayhew, RVT, RDCS, RDMS

## 2014-11-20 NOTE — Progress Notes (Signed)
STROKE TEAM PROGRESS NOTE   HISTORY Jacob Joseph is an 58 y.o. male with a past medical history that is pertinent for HTN, DM type II, bilateral cortical infarcts without residual deficits, HIV disease, hepatitis C, polysubstance abuse, and smoking, brought in for further evaluation of transient vertigo and dysarthria, concern for TIA. Patient stated that this afternoon he was walking in the supermarket in company of his wife when suddenly " thing started rotating, moving around, and I went down". He said that he did not have loss of awareness but was unable to think clearly say what he was thinking or form words. (LKW 11/19/2014 at 1500). Of note, review of his hospital record showed that " his wife states that there was a period of 10-15 seconds when the patient was on the ground and staring off into space and was not responsive". No abnormal movements noted, bladder or bowel incontinence. Denies HA, double vision, difficulty seeing, or feeling sweaty or clammy before collapsing to the floor. He said that the whole episode lasted for approximately 45 minutes. Patient not found to have orthostatic hypotension upon initial evaluation in the emergency department. CT brain was performed and showed no acute abnormality. Routine serologies including UDS are unrevealing. Patient was not administered TPA secondary to symptoms resolved. He was admitted for further evaluation and treatment.   SUBJECTIVE (INTERVAL HISTORY)   OBJECTIVE Temp:  [97.6 F (36.4 C)-98.8 F (37.1 C)] 98.1 F (36.7 C) (05/03 0749) Pulse Rate:  [67-82] 80 (05/03 6433) Cardiac Rhythm:  [-] Normal sinus rhythm (05/03 0800) Resp:  [13-25] 18 (05/03 0652) BP: (102-145)/(48-96) 102/48 mmHg (05/03 0652) SpO2:  [93 %-100 %] 98 % (05/03 0453) Weight:  [93.668 kg (206 lb 8 oz)] 93.668 kg (206 lb 8 oz) (05/02 2023)   Recent Labs Lab 11/19/14 1632  GLUCAP 90    Recent Labs Lab 11/19/14 1607 11/20/14 0311  NA 137 137  K 4.4  3.9  CL 108 106  CO2 20* 20*  GLUCOSE 93 105*  BUN 16 17  CREATININE 1.18 1.09  CALCIUM 9.0 8.8*    Recent Labs Lab 11/19/14 1607  AST 23  ALT 15*  ALKPHOS 87  BILITOT 0.7  PROT 8.3*  ALBUMIN 3.4*    Recent Labs Lab 11/19/14 1607  WBC 7.6  HGB 13.8  HCT 41.8  MCV 86.7  PLT 193    Recent Labs Lab 11/19/14 1607 11/19/14 2130 11/20/14 0311  TROPONINI <0.03 <0.03 <0.03   No results for input(s): LABPROT, INR in the last 72 hours.  Recent Labs  11/19/14 1650  COLORURINE YELLOW  LABSPEC 1.023  PHURINE 5.0  GLUCOSEU >1000*  HGBUR NEGATIVE  BILIRUBINUR NEGATIVE  KETONESUR 15*  PROTEINUR NEGATIVE  UROBILINOGEN 1.0  NITRITE NEGATIVE  LEUKOCYTESUR NEGATIVE       Component Value Date/Time   CHOL 168 09/10/2014 1441   TRIG 194* 09/10/2014 1441   HDL 40 09/10/2014 1441   CHOLHDL 4.2 09/10/2014 1441   VLDL 39 09/10/2014 1441   LDLCALC 89 09/10/2014 1441   Lab Results  Component Value Date   HGBA1C 6.6 10/31/2014      Component Value Date/Time   LABOPIA NONE DETECTED 11/19/2014 1650   LABOPIA NEG 05/31/2013 1625   COCAINSCRNUR NONE DETECTED 11/19/2014 1650   COCAINSCRNUR NEG 05/31/2013 1625   LABBENZ NONE DETECTED 11/19/2014 1650   LABBENZ NEG 05/31/2013 1625   LABBENZ NEGATIVE 03/09/2012 0741   AMPHETMU NONE DETECTED 11/19/2014 1650   AMPHETMU NEG 05/31/2013 1625  AMPHETMU NEGATIVE 03/09/2012 0741   THCU NONE DETECTED 11/19/2014 1650   THCU NEG 05/31/2013 1625   LABBARB NONE DETECTED 11/19/2014 1650   LABBARB NEG 05/31/2013 1625    No results for input(s): ETH in the last 168 hours.  Dg Chest 2 View  11/19/2014   CLINICAL DATA:  Syncope, headache  EXAM: CHEST  2 VIEW  COMPARISON:  Five/ 2/15  FINDINGS: Cardiomediastinal silhouette is stable. No acute infiltrate or pleural effusion. No pulmonary edema. Stable bilateral basilar atelectasis or scarring.  IMPRESSION: No active cardiopulmonary disease.   Electronically Signed   By: Lahoma Crocker M.D.    On: 11/19/2014 17:08   Ct Head Wo Contrast  11/19/2014   CLINICAL DATA:  Altered mental status.  Fall.  EXAM: CT HEAD WITHOUT CONTRAST  TECHNIQUE: Contiguous axial images were obtained from the base of the skull through the vertex without intravenous contrast.  COMPARISON:  MRI 03/09/2012.  FINDINGS: No mass lesion, mass effect, midline shift, hydrocephalus, hemorrhage. No acute territorial cortical ischemia/infarct. Atrophy and chronic ischemic white matter disease is present. Carotid atherosclerosis is present. Mild paranasal sinus disease.  IMPRESSION: Atrophy and chronic ischemic white matter disease without acute intracranial abnormality.   Electronically Signed   By: Dereck Ligas M.D.   On: 11/19/2014 18:22   Mr Jodene Nam Head Wo Contrast  11/20/2014   CLINICAL DATA:  Initial evaluation for acute syncope, aphasia.  EXAM: MRI HEAD WITHOUT CONTRAST  MRA HEAD WITHOUT CONTRAST  TECHNIQUE: Multiplanar, multiecho pulse sequences of the brain and surrounding structures were obtained without intravenous contrast. Angiographic images of the head were obtained using MRA technique without contrast.  COMPARISON:  Prior CT from 11/19/2014 as well as previous MRI from 03/09/2012.  FINDINGS: MRI HEAD FINDINGS  Diffuse prominence of the CSF containing spaces is compatible with generalized cerebral atrophy. Patchy and confluent T2/FLAIR hyperintensity within the periventricular and deep white matter both cerebral hemispheres most consistent with chronic small vessel ischemic disease, mild to moderate nature.  Remote lacunar infarct present within the high and posterior left frontal lobe (series 8, image 23). Additional remote infarcts present within the periventricular white matter of the left corona radiata.  There are scattered foci of patchy restricted diffusion involving the cortical gray matter of the right parietal lobe (series 3, image 40, 36). Additional small cortical infarct present more anteriorly within the right  frontal lobe (series 3, image 38, 34) there is patchy ischemic infarct involving the right caudate head (series 3, image 26). Additional infarct involving the right occipital lobe (series 3, image 19). Additional tiny possible infarct within the right middle cerebral peduncle (series 3, image 18). These are all likely within the right MCA territory distribution. No associated hemorrhage or significant mass effect.  No mass lesion or midline shift. No hydrocephalus. No extra-axial fluid collection.  Craniocervical junction within normal limits. Pituitary gland normal. No acute abnormality about the orbits.  Mild opacity seen layering within the sphenoid sinuses bilaterally. Paranasal sinuses are otherwise clear. No mastoid effusion. Inner ear structures normal.  Bone marrow signal intensity within normal limits. Mild degenerative changes noted within the visualized upper cervical spine. Scalp soft tissues within normal limits.  MRA HEAD FINDINGS  ANTERIOR CIRCULATION:  Visualized distal cervical segments of the internal carotid arteries are widely patent with antegrade flow. The left ICA is somewhat diminutive as compared to the right. Petrous segments and cavernous segments are patent bilaterally. Again, there is occlusion of the supraclinoid left ICA, similar to prior. Supraclinoid  right ICA is widely patent. There is a widely patent right A1 segment supplying both anterior cerebral arteries which are well opacified. Right M1 segment widely patent without stenosis or occlusion. MCA bifurcation on the right is normal. Distal MCA branches on the right are well opacified.  Left MCA artery and its branches are not visualize, stable from prior.  POSTERIOR CIRCULATION:  Both vertebral arteries are widely patent to the vertebrobasilar junction. Posterior inferior cerebral arteries not well evaluated on this exam. Basilar artery widely patent. Right anterior inferior cerebellar artery is dominant. Superior cerebellar  arteries patent bilaterally. Right posterior cerebral artery widely patent. There is fetal origin of the left PCA with widely patent left posterior communicating artery. This is supplied via the left ICA just prior to its occlusion at its supraclinoid segment. Left PCA is widely patent.  No aneurysm.  IMPRESSION: MRI HEAD IMPRESSION:  1. Patchy acute ischemic infarcts involving the cortical gray matter of the right frontal parietal lobes as well as the right occipital lobe, with additional infarct involving the right caudate head. These infarcts are in a right MCA territory distribution. No associated hemorrhage or significant mass effect. 2. Remote bilateral frontal lobe infarcts as detailed above. 3. Mild atrophy with chronic small vessel ischemic disease.  MRA HEAD IMPRESSION:  1. Chronic occlusion of the supraclinoid left ICA with absent flow within the left middle cerebral artery and its branches distally. This is stable relative to previous MRA from 2013. 2. No new proximal branch occlusion or hemodynamically significant stenosis identified. 3. Anterior cerebral arteries supplied via a widely patent right A1 segment. 4. Fetal origin of the left PCA, supplied via the left internal carotid artery just prior to its occlusion at its supraclinoid segment.   Electronically Signed   By: Jeannine Boga M.D.   On: 11/20/2014 02:20   Mr Brain Wo Contrast  11/20/2014   CLINICAL DATA:  Initial evaluation for acute syncope, aphasia.  EXAM: MRI HEAD WITHOUT CONTRAST  MRA HEAD WITHOUT CONTRAST  TECHNIQUE: Multiplanar, multiecho pulse sequences of the brain and surrounding structures were obtained without intravenous contrast. Angiographic images of the head were obtained using MRA technique without contrast.  COMPARISON:  Prior CT from 11/19/2014 as well as previous MRI from 03/09/2012.  FINDINGS: MRI HEAD FINDINGS  Diffuse prominence of the CSF containing spaces is compatible with generalized cerebral atrophy.  Patchy and confluent T2/FLAIR hyperintensity within the periventricular and deep white matter both cerebral hemispheres most consistent with chronic small vessel ischemic disease, mild to moderate nature.  Remote lacunar infarct present within the high and posterior left frontal lobe (series 8, image 23). Additional remote infarcts present within the periventricular white matter of the left corona radiata.  There are scattered foci of patchy restricted diffusion involving the cortical gray matter of the right parietal lobe (series 3, image 40, 36). Additional small cortical infarct present more anteriorly within the right frontal lobe (series 3, image 38, 34) there is patchy ischemic infarct involving the right caudate head (series 3, image 26). Additional infarct involving the right occipital lobe (series 3, image 19). Additional tiny possible infarct within the right middle cerebral peduncle (series 3, image 18). These are all likely within the right MCA territory distribution. No associated hemorrhage or significant mass effect.  No mass lesion or midline shift. No hydrocephalus. No extra-axial fluid collection.  Craniocervical junction within normal limits. Pituitary gland normal. No acute abnormality about the orbits.  Mild opacity seen layering within the sphenoid sinuses bilaterally.  Paranasal sinuses are otherwise clear. No mastoid effusion. Inner ear structures normal.  Bone marrow signal intensity within normal limits. Mild degenerative changes noted within the visualized upper cervical spine. Scalp soft tissues within normal limits.  MRA HEAD FINDINGS  ANTERIOR CIRCULATION:  Visualized distal cervical segments of the internal carotid arteries are widely patent with antegrade flow. The left ICA is somewhat diminutive as compared to the right. Petrous segments and cavernous segments are patent bilaterally. Again, there is occlusion of the supraclinoid left ICA, similar to prior. Supraclinoid right ICA is  widely patent. There is a widely patent right A1 segment supplying both anterior cerebral arteries which are well opacified. Right M1 segment widely patent without stenosis or occlusion. MCA bifurcation on the right is normal. Distal MCA branches on the right are well opacified.  Left MCA artery and its branches are not visualize, stable from prior.  POSTERIOR CIRCULATION:  Both vertebral arteries are widely patent to the vertebrobasilar junction. Posterior inferior cerebral arteries not well evaluated on this exam. Basilar artery widely patent. Right anterior inferior cerebellar artery is dominant. Superior cerebellar arteries patent bilaterally. Right posterior cerebral artery widely patent. There is fetal origin of the left PCA with widely patent left posterior communicating artery. This is supplied via the left ICA just prior to its occlusion at its supraclinoid segment. Left PCA is widely patent.  No aneurysm.  IMPRESSION: MRI HEAD IMPRESSION:  1. Patchy acute ischemic infarcts involving the cortical gray matter of the right frontal parietal lobes as well as the right occipital lobe, with additional infarct involving the right caudate head. These infarcts are in a right MCA territory distribution. No associated hemorrhage or significant mass effect. 2. Remote bilateral frontal lobe infarcts as detailed above. 3. Mild atrophy with chronic small vessel ischemic disease.  MRA HEAD IMPRESSION:  1. Chronic occlusion of the supraclinoid left ICA with absent flow within the left middle cerebral artery and its branches distally. This is stable relative to previous MRA from 2013. 2. No new proximal branch occlusion or hemodynamically significant stenosis identified. 3. Anterior cerebral arteries supplied via a widely patent right A1 segment. 4. Fetal origin of the left PCA, supplied via the left internal carotid artery just prior to its occlusion at its supraclinoid segment.   Electronically Signed   By: Jeannine Boga M.D.   On: 11/20/2014 02:20   Carotid Doppler  There is 1-39% bilateral ICA stenosis. Vertebral artery flow is antegrade.     PHYSICAL EXAM  . Afebrile. Head is nontraumatic. Neck is supple without bruit.    Cardiac exam no murmur or gallop. Lungs are clear to auscultation. Distal pulses are well felt. Neurological Exam ;  Awake  Alert oriented x 3. Normal speech and language.eye movements full without nystagmus.fundi were not visualized. Vision acuity and fields appear normal. Hearing is normal. Palatal movements are normal. Face symmetric. Tongue midline. Normal strength, tone, reflexes and coordination. Normal sensation. Gait deferred.ASSESSMENT/PLAN Jacob Joseph is a 59 y.o. male with history of HTN, DM type II, bilateral cortical infarcts without residual deficits, HIV disease, hepatitis C, polysubstance abuse, and smoking presenting with transient vertigo and dysarthria. He did not receive IV t-PA due to resolved symptoms.   Stroke:  R MCA patchy infarcts secondary to embolic etiology and source yet undetermined.  Resultant  Neuro deficits resolved  MRI  Patchy cortical right frontal parietal lobes and right occipital lobe and right caudate head infarcts (R MCA). Old bilateral frontal lobe infarcts. Mild atrophy  with chronic small vessel ischemic disease.  MRA  Chronic occlusion of supraclinoid left ICA with no flow L MCA. Fetal origin left PCA  Carotid Doppler  No significant stenosis   2D Echo  pending   TEE 2013 - no SOE, no PFO  HgbA1c 6.6  Lovenox 40 mg sq daily for VTE prophylaxis  Diet heart healthy/carb modified Room service appropriate?: Yes; Fluid consistency:: Thin  no antithrombotic prior to admission, now on aspirin 81 mg orally every day  Patient counseled to be compliant with his antithrombotic medications  Ongoing aggressive stroke risk factor management  Therapy recommendations:  No PT, OT pending  Disposition:  Anticipate return  home  Hypertension  Home meds:   Lisinopril, metoprolol  Stable  Hyperlipidemia  Home meds:  No statin due to intolerance hx  Recommend checking lipids  LDL goal < 70  Other Stroke Risk Factors  Cigarette smoker, 20.5 year pack hx, advised to stop smoking  Cocaine and Marijuana use  Hx stroke/TIA - seen by Stroke Team at that time - 11/2011 Left parietal white matter and left superior temporal cortex as well as subacute infarcts in the right deep white matter in a watershed distribution. Infarcts secondary to terminal L ICA and M1 occlusion due to unknown embolic source. Likely due to arrythmia associated with cocaine abuse. Had R hp which resolved  HIV  Other Active Problems  COPD  Sialadenitis  Depression  Onychomycosis   Genital herpes  Polysubstance abuse  Other Pertinent History  Latent TB  Hepatitis C  Hospital day # 1 I have personally examined this patient, reviewed notes, independently viewed imaging studies, participated in medical decision making and plan of care. I have made any additions or clarifications directly to the above note. Agree with note above. He presented with transient episode of dizziness, brief loss of consciousness and speech difficulty possibly a syncopal event versus vertebral basilar TIA. He remains at risk for recurrent strokes/TIAs and neurological worsening and needs ongoing stroke evaluation and aggressive risk factor control.iven prior history  And cocaine abuse and strong suspicion for cardiac source of embolism versus arrhythmia  Antony Contras, MD Medical Director [facility] Pager: [phone] 11/20/2014 11:18 PM      To contact Stroke Continuity provider, please refer to [email or web address] After hours, contact General Neurology

## 2014-11-20 NOTE — Progress Notes (Signed)
Internal Medicine Progress Note  Subjective: Today no complaints, feeling well. Denies chest pain, SOB, headache, dizziness, weakness.  Objective: Vital signs in last 24 hours: Filed Vitals:   11/20/14 0749 11/20/14 0900 11/20/14 0904 11/20/14 0912  BP:  122/78 128/74 125/83  Pulse:  80 92 96  Temp: 98.1 F (36.7 C)     TempSrc: Oral     Resp:      Height:      Weight:      SpO2:       Weight change:   Intake/Output Summary (Last 24 hours) at 11/20/14 1350 Last data filed at 11/20/14 0500  Gross per 24 hour  Intake      0 ml  Output    450 ml  Net   -450 ml   BP 125/83 mmHg  Pulse 96  Temp(Src) 98.1 F (36.7 C) (Oral)  Resp 18  Ht 6' 0.01" (1.829 m)  Wt 206 lb 8 oz (93.668 kg)  BMI 28.00 kg/m2  SpO2 98%  General Appearance:    Alert, cooperative, no distress, appears stated age  Head:    Normocephalic, without obvious abnormality, atraumatic  Eyes:    PERRL, conjunctiva/corneas clear, EOM's intact  Throat:   mmm  Lungs:     Clear to auscultation bilaterally, respirations unlabored  Heart:    Regular rate and rhythm, S1 and S2 normal, no murmur, rub   or gallop  Abdomen:     Soft, non-tender, normoactive bowel sounds  Extremities:   Extremities normal, atraumatic, no cyanosis or edema  Neurologic:   Conversant, appropriate affect. EOMI, PERRLA. Symmetric eye closure, cheek puffing, smile, tongue movement. Tongue midline. 5/5 strength in BUE, BLE. 1+ bilateral patellar reflexes.   Lab Results: Basic Metabolic Panel:  Recent Labs Lab 11/19/14 1607 11/20/14 0311  NA 137 137  K 4.4 3.9  CL 108 106  CO2 20* 20*  GLUCOSE 93 105*  BUN 16 17  CREATININE 1.18 1.09  CALCIUM 9.0 8.8*   Liver Function Tests:  Recent Labs Lab 11/19/14 1607  AST 23  ALT 15*  ALKPHOS 87  BILITOT 0.7  PROT 8.3*  ALBUMIN 3.4*   No results for input(s): LIPASE, AMYLASE in the last 168 hours. No results for input(s): AMMONIA in the last 168 hours. CBC:  Recent Labs Lab  11/19/14 1607  WBC 7.6  HGB 13.8  HCT 41.8  MCV 86.7  PLT 193   Cardiac Enzymes:  Recent Labs Lab 11/19/14 2130 11/20/14 0311 11/20/14 0927  TROPONINI <0.03 <0.03 <0.03   BNP: No results for input(s): PROBNP in the last 168 hours. D-Dimer: No results for input(s): DDIMER in the last 168 hours. CBG:  Recent Labs Lab 11/19/14 1632  GLUCAP 90   Hemoglobin A1C: No results for input(s): HGBA1C in the last 168 hours. Fasting Lipid Panel: No results for input(s): CHOL, HDL, LDLCALC, TRIG, CHOLHDL, LDLDIRECT in the last 168 hours. Thyroid Function Tests: No results for input(s): TSH, T4TOTAL, FREET4, T3FREE, THYROIDAB in the last 168 hours. Coagulation: No results for input(s): LABPROT, INR in the last 168 hours. Anemia Panel: No results for input(s): VITAMINB12, FOLATE, FERRITIN, TIBC, IRON, RETICCTPCT in the last 168 hours. Urine Drug Screen: Drugs of Abuse     Component Value Date/Time   LABOPIA NONE DETECTED 11/19/2014 1650   LABOPIA NEG 05/31/2013 1625   COCAINSCRNUR NONE DETECTED 11/19/2014 1650   COCAINSCRNUR NEG 05/31/2013 1625   LABBENZ NONE DETECTED 11/19/2014 1650   LABBENZ NEG 05/31/2013 1625  LABBENZ NEGATIVE 03/09/2012 0741   AMPHETMU NONE DETECTED 11/19/2014 1650   AMPHETMU NEG 05/31/2013 1625   AMPHETMU NEGATIVE 03/09/2012 0741   THCU NONE DETECTED 11/19/2014 1650   THCU NEG 05/31/2013 1625   LABBARB NONE DETECTED 11/19/2014 1650   LABBARB NEG 05/31/2013 1625    Alcohol Level: No results for input(s): ETH in the last 168 hours. Urinalysis:  Recent Labs Lab 11/19/14 1650  COLORURINE YELLOW  LABSPEC 1.023  PHURINE 5.0  GLUCOSEU >1000*  HGBUR NEGATIVE  BILIRUBINUR NEGATIVE  KETONESUR 15*  PROTEINUR NEGATIVE  UROBILINOGEN 1.0  NITRITE NEGATIVE  LEUKOCYTESUR NEGATIVE   Micro Results: No results found for this or any previous visit (from the past 240 hour(s)). Studies/Results: Dg Chest 2 View  11/19/2014   CLINICAL DATA:  Syncope,  headache  EXAM: CHEST  2 VIEW  COMPARISON:  Five/ 2/15  FINDINGS: Cardiomediastinal silhouette is stable. No acute infiltrate or pleural effusion. No pulmonary edema. Stable bilateral basilar atelectasis or scarring.  IMPRESSION: No active cardiopulmonary disease.   Electronically Signed   By: Lahoma Crocker M.D.   On: 11/19/2014 17:08   Ct Head Wo Contrast  11/19/2014   CLINICAL DATA:  Altered mental status.  Fall.  EXAM: CT HEAD WITHOUT CONTRAST  TECHNIQUE: Contiguous axial images were obtained from the base of the skull through the vertex without intravenous contrast.  COMPARISON:  MRI 03/09/2012.  FINDINGS: No mass lesion, mass effect, midline shift, hydrocephalus, hemorrhage. No acute territorial cortical ischemia/infarct. Atrophy and chronic ischemic white matter disease is present. Carotid atherosclerosis is present. Mild paranasal sinus disease.  IMPRESSION: Atrophy and chronic ischemic white matter disease without acute intracranial abnormality.   Electronically Signed   By: Dereck Ligas M.D.   On: 11/19/2014 18:22   Mr Jodene Nam Head Wo Contrast  11/20/2014   CLINICAL DATA:  Initial evaluation for acute syncope, aphasia.  EXAM: MRI HEAD WITHOUT CONTRAST  MRA HEAD WITHOUT CONTRAST  TECHNIQUE: Multiplanar, multiecho pulse sequences of the brain and surrounding structures were obtained without intravenous contrast. Angiographic images of the head were obtained using MRA technique without contrast.  COMPARISON:  Prior CT from 11/19/2014 as well as previous MRI from 03/09/2012.  FINDINGS: MRI HEAD FINDINGS  Diffuse prominence of the CSF containing spaces is compatible with generalized cerebral atrophy. Patchy and confluent T2/FLAIR hyperintensity within the periventricular and deep white matter both cerebral hemispheres most consistent with chronic small vessel ischemic disease, mild to moderate nature.  Remote lacunar infarct present within the high and posterior left frontal lobe (series 8, image 23). Additional  remote infarcts present within the periventricular white matter of the left corona radiata.  There are scattered foci of patchy restricted diffusion involving the cortical gray matter of the right parietal lobe (series 3, image 40, 36). Additional small cortical infarct present more anteriorly within the right frontal lobe (series 3, image 38, 34) there is patchy ischemic infarct involving the right caudate head (series 3, image 26). Additional infarct involving the right occipital lobe (series 3, image 19). Additional tiny possible infarct within the right middle cerebral peduncle (series 3, image 18). These are all likely within the right MCA territory distribution. No associated hemorrhage or significant mass effect.  No mass lesion or midline shift. No hydrocephalus. No extra-axial fluid collection.  Craniocervical junction within normal limits. Pituitary gland normal. No acute abnormality about the orbits.  Mild opacity seen layering within the sphenoid sinuses bilaterally. Paranasal sinuses are otherwise clear. No mastoid effusion. Inner ear structures normal.  Bone marrow signal intensity within normal limits. Mild degenerative changes noted within the visualized upper cervical spine. Scalp soft tissues within normal limits.  MRA HEAD FINDINGS  ANTERIOR CIRCULATION:  Visualized distal cervical segments of the internal carotid arteries are widely patent with antegrade flow. The left ICA is somewhat diminutive as compared to the right. Petrous segments and cavernous segments are patent bilaterally. Again, there is occlusion of the supraclinoid left ICA, similar to prior. Supraclinoid right ICA is widely patent. There is a widely patent right A1 segment supplying both anterior cerebral arteries which are well opacified. Right M1 segment widely patent without stenosis or occlusion. MCA bifurcation on the right is normal. Distal MCA branches on the right are well opacified.  Left MCA artery and its branches are  not visualize, stable from prior.  POSTERIOR CIRCULATION:  Both vertebral arteries are widely patent to the vertebrobasilar junction. Posterior inferior cerebral arteries not well evaluated on this exam. Basilar artery widely patent. Right anterior inferior cerebellar artery is dominant. Superior cerebellar arteries patent bilaterally. Right posterior cerebral artery widely patent. There is fetal origin of the left PCA with widely patent left posterior communicating artery. This is supplied via the left ICA just prior to its occlusion at its supraclinoid segment. Left PCA is widely patent.  No aneurysm.  IMPRESSION: MRI HEAD IMPRESSION:  1. Patchy acute ischemic infarcts involving the cortical gray matter of the right frontal parietal lobes as well as the right occipital lobe, with additional infarct involving the right caudate head. These infarcts are in a right MCA territory distribution. No associated hemorrhage or significant mass effect. 2. Remote bilateral frontal lobe infarcts as detailed above. 3. Mild atrophy with chronic small vessel ischemic disease.  MRA HEAD IMPRESSION:  1. Chronic occlusion of the supraclinoid left ICA with absent flow within the left middle cerebral artery and its branches distally. This is stable relative to previous MRA from 2013. 2. No new proximal branch occlusion or hemodynamically significant stenosis identified. 3. Anterior cerebral arteries supplied via a widely patent right A1 segment. 4. Fetal origin of the left PCA, supplied via the left internal carotid artery just prior to its occlusion at its supraclinoid segment.   Electronically Signed   By: Jeannine Boga M.D.   On: 11/20/2014 02:20   Mr Brain Wo Contrast  11/20/2014   CLINICAL DATA:  Initial evaluation for acute syncope, aphasia.  EXAM: MRI HEAD WITHOUT CONTRAST  MRA HEAD WITHOUT CONTRAST  TECHNIQUE: Multiplanar, multiecho pulse sequences of the brain and surrounding structures were obtained without  intravenous contrast. Angiographic images of the head were obtained using MRA technique without contrast.  COMPARISON:  Prior CT from 11/19/2014 as well as previous MRI from 03/09/2012.  FINDINGS: MRI HEAD FINDINGS  Diffuse prominence of the CSF containing spaces is compatible with generalized cerebral atrophy. Patchy and confluent T2/FLAIR hyperintensity within the periventricular and deep white matter both cerebral hemispheres most consistent with chronic small vessel ischemic disease, mild to moderate nature.  Remote lacunar infarct present within the high and posterior left frontal lobe (series 8, image 23). Additional remote infarcts present within the periventricular white matter of the left corona radiata.  There are scattered foci of patchy restricted diffusion involving the cortical gray matter of the right parietal lobe (series 3, image 40, 36). Additional small cortical infarct present more anteriorly within the right frontal lobe (series 3, image 38, 34) there is patchy ischemic infarct involving the right caudate head (series 3, image 26). Additional infarct  involving the right occipital lobe (series 3, image 19). Additional tiny possible infarct within the right middle cerebral peduncle (series 3, image 18). These are all likely within the right MCA territory distribution. No associated hemorrhage or significant mass effect.  No mass lesion or midline shift. No hydrocephalus. No extra-axial fluid collection.  Craniocervical junction within normal limits. Pituitary gland normal. No acute abnormality about the orbits.  Mild opacity seen layering within the sphenoid sinuses bilaterally. Paranasal sinuses are otherwise clear. No mastoid effusion. Inner ear structures normal.  Bone marrow signal intensity within normal limits. Mild degenerative changes noted within the visualized upper cervical spine. Scalp soft tissues within normal limits.  MRA HEAD FINDINGS  ANTERIOR CIRCULATION:  Visualized distal  cervical segments of the internal carotid arteries are widely patent with antegrade flow. The left ICA is somewhat diminutive as compared to the right. Petrous segments and cavernous segments are patent bilaterally. Again, there is occlusion of the supraclinoid left ICA, similar to prior. Supraclinoid right ICA is widely patent. There is a widely patent right A1 segment supplying both anterior cerebral arteries which are well opacified. Right M1 segment widely patent without stenosis or occlusion. MCA bifurcation on the right is normal. Distal MCA branches on the right are well opacified.  Left MCA artery and its branches are not visualize, stable from prior.  POSTERIOR CIRCULATION:  Both vertebral arteries are widely patent to the vertebrobasilar junction. Posterior inferior cerebral arteries not well evaluated on this exam. Basilar artery widely patent. Right anterior inferior cerebellar artery is dominant. Superior cerebellar arteries patent bilaterally. Right posterior cerebral artery widely patent. There is fetal origin of the left PCA with widely patent left posterior communicating artery. This is supplied via the left ICA just prior to its occlusion at its supraclinoid segment. Left PCA is widely patent.  No aneurysm.  IMPRESSION: MRI HEAD IMPRESSION:  1. Patchy acute ischemic infarcts involving the cortical gray matter of the right frontal parietal lobes as well as the right occipital lobe, with additional infarct involving the right caudate head. These infarcts are in a right MCA territory distribution. No associated hemorrhage or significant mass effect. 2. Remote bilateral frontal lobe infarcts as detailed above. 3. Mild atrophy with chronic small vessel ischemic disease.  MRA HEAD IMPRESSION:  1. Chronic occlusion of the supraclinoid left ICA with absent flow within the left middle cerebral artery and its branches distally. This is stable relative to previous MRA from 2013. 2. No new proximal branch  occlusion or hemodynamically significant stenosis identified. 3. Anterior cerebral arteries supplied via a widely patent right A1 segment. 4. Fetal origin of the left PCA, supplied via the left internal carotid artery just prior to its occlusion at its supraclinoid segment.   Electronically Signed   By: Jeannine Boga M.D.   On: 11/20/2014 02:20   Medications: I have reviewed the patient's current medications. Scheduled Meds: .  stroke: mapping our early stages of recovery book   Does not apply Once  . aspirin EC  325 mg Oral Daily  . cephALEXin  500 mg Oral QID  . citalopram  20 mg Oral Daily  . Darunavir Ethanolate  800 mg Oral Q breakfast  . elvitegravir-cobicistat-emtricitabine-tenofovir  1 tablet Oral Q breakfast  . enoxaparin (LOVENOX) injection  40 mg Subcutaneous Q24H  . mometasone-formoterol  2 puff Inhalation BID  . QUEtiapine  200 mg Oral QHS  . terbinafine  250 mg Oral Daily  . tiotropium  18 mcg Inhalation Daily  .  valACYclovir  500 mg Oral Daily   Continuous Infusions:  PRN Meds:.acetaminophen, albuterol, hydroxypropyl methylcellulose / hypromellose Assessment/Plan: Principal Problem:   Syncope Active Problems:   Human immunodeficiency virus (HIV) disease   Polysubstance abuse   Essential hypertension   COPD, moderate   Diabetes mellitus, type 2 Jacob Joseph is a 23-y.o. who presented after one episode of LOC preceded by vertigo and followed by a brief period of slurred speech with complete resolution. He subsequently was found to have diffuse small infarcts in R MCA territory.  Stroke: MRI findings demonstrate multiple new small infarcts, unclear etiology. Previous h/o CVA in 2013, pt completed a 3 month course of Plavix, no ASA after. - Neurology consulted, appreciate recs - MRI brain: patchy acute ischemic infarcts in R frontal parietal lobes and R occipital lobe, R caudate head. - MRA head: chronic L ICA occlusion in L MCA stable from MRA in 2013. - EEG  11/20/14 normal - Carotid dopplers negative for occlusion - TTE pending. Consider TEE given multiple acute patchy infarcts, pending TTE results. - EKG 5/2 and 5/3 AM: lateral lead TWI. Troponin negative x2. - ASA 325 mg. Transition to 81 mg daily. - Tele - PT has seen pt, recommend no PT needed.  HIV: Home meds Genv[place] and [place]. Last viral load undetectable, last CD4 400 on 10/2014. - Continue home meds  COPD: Home meds: albuterol inhaler, Dulera, Spiriva. Stable. - Albuterol inhaler prn - Continue home Dulera, Spiriva  Hypertension: Normotensive since admission. Last BP 122/78. Home meds metoprolol 25 mg BID, lisinopril 20 mg daily - antihypertensives held for permissive HTN in the setting of acute infarct  Sialadenitis: pt presented to ED on 11/15/2014, started on 10 days cephalexin - continue 10 day course of cephalexin (D1 = 4/28/12016)  Type II Diabetes: BG 93, 105 since admission. Home meds: glipizide 5 mg daily. Last A1c 6.6 in 10/2014. - Hgb A1c pending - Consider SSI for hyperglycemia  Depression: Home meds: Seroquel 150 mg qHS, citalopra. - Continue home Seroquel at 20 mg  - Celexa 20 mg daily  Onychomycosis: Started Terbinafine on 10/31/2014 for fingernail, toenail infection. - Continue Terbinafine  Genital Herpes: Stable. Home meds Valtrex. - Continue Valtrex  Polysubstance Abuse: History of cocaine, crack cocaine, marijuana, EtOH.  - UDS negative  Hepatitis C: Completed Harvoni treatment 10/20/2014  PPX: enoxaparin 40 mg daily  Dispo: Disposition is deferred at this time, awaiting improvement of current medical problems.  Anticipated discharge in approximately 1-2 day(s).   The patient does have a current PCP Jessee Avers, MD) and does need an [facility] hospital follow-up appointment after discharge.  The patient does not have transportation limitations that hinder transportation to clinic appointments.  .Services Needed at time of discharge: Y = Yes, Blank =  No PT:   OT:   RN:   Equipment:   Other:     LOS: 1 day   Susa Day, Med Student 11/20/2014, 1:50 PM

## 2014-11-21 ENCOUNTER — Inpatient Hospital Stay (HOSPITAL_COMMUNITY): Payer: Medicare Other

## 2014-11-21 ENCOUNTER — Telehealth: Payer: Self-pay | Admitting: Pharmacist

## 2014-11-21 DIAGNOSIS — B351 Tinea unguium: Secondary | ICD-10-CM

## 2014-11-21 DIAGNOSIS — A6000 Herpesviral infection of urogenital system, unspecified: Secondary | ICD-10-CM

## 2014-11-21 DIAGNOSIS — F329 Major depressive disorder, single episode, unspecified: Secondary | ICD-10-CM

## 2014-11-21 DIAGNOSIS — I6340 Cerebral infarction due to embolism of unspecified cerebral artery: Principal | ICD-10-CM

## 2014-11-21 DIAGNOSIS — I639 Cerebral infarction, unspecified: Secondary | ICD-10-CM

## 2014-11-21 DIAGNOSIS — R55 Syncope and collapse: Secondary | ICD-10-CM

## 2014-11-21 LAB — GLUCOSE, CAPILLARY
Glucose-Capillary: 122 mg/dL — ABNORMAL HIGH (ref 70–99)
Glucose-Capillary: 143 mg/dL — ABNORMAL HIGH (ref 70–99)
Glucose-Capillary: 105 mg/dL — ABNORMAL HIGH (ref 70–99)
Glucose-Capillary: 112 mg/dL — ABNORMAL HIGH (ref 70–99)

## 2014-11-21 LAB — BASIC METABOLIC PANEL
Anion gap: 8 (ref 5–15)
BUN: 13 mg/dL (ref 6–20)
CALCIUM: 9 mg/dL (ref 8.9–10.3)
CO2: 23 mmol/L (ref 22–32)
CREATININE: 0.99 mg/dL (ref 0.61–1.24)
Chloride: 106 mmol/L (ref 101–111)
GFR calc Af Amer: >60 mL/min (ref >60)
GFR calc non Af Amer: >60 mL/min (ref >60)
GLUCOSE: 130 mg/dL — AB (ref 70–99)
Potassium: 4 mmol/L (ref 3.5–5.1)
SODIUM: 137 mmol/L (ref 135–145)

## 2014-11-21 MED ORDER — PRAVASTATIN SODIUM 40 MG PO TABS
40.0000 mg | ORAL_TABLET | Freq: Every day | ORAL | Status: DC
  Administered 2014-11-21: 40 mg via ORAL
  Filled 2014-11-21: qty 1

## 2014-11-21 MED ORDER — PRAVASTATIN SODIUM 40 MG PO TABS: 40.0000 mg | ORAL_TABLET | Freq: Every day | ORAL | Status: DC

## 2014-11-21 MED ORDER — ASPIRIN 81 MG PO TBEC: 81.0000 mg | DELAYED_RELEASE_TABLET | Freq: Every day | ORAL | Status: DC

## 2014-11-21 NOTE — Clinical Documentation Improvement (Signed)
"  HIV disease", "HIV Infection" is documented in the current medical record.   The CDC defines AIDS as present in an HIV positive patient who has or has had any of the following:  - Current or prior CD4+ T-lymphocyte count <200 cells/uL  - Current or prior CD4+ T-lymphocyte count < 14% of total lymphocytes    - Current or prior diagnosis of an AIDS-defining condition  Please clarify if the patient has:  - AIDS, including previous CD4 or CD4% count or AIDS defining condition, if applicable  - HIV Infection, HIV positive, or HIV Asymptomatic  - Other Condition  - Unable to Clinically Determine  Thank you, Mateo Flow, RN [phone] Clinical Documentation Specialist

## 2014-11-21 NOTE — Progress Notes (Signed)
Subjective: No complaints today. Feeling well.   Objective: Vital signs in last 24 hours: Filed Vitals:   11/20/14 1500 11/20/14 2000 11/20/14 2002 11/21/14 0400  BP: 146/81 143/89  142/70  Pulse: 85 77  80  Temp: 98.2 F (36.8 C) 98.4 F (36.9 C)  98.4 F (36.9 C)  TempSrc: Oral Oral  Oral  Resp:  20  20  Height:      Weight:    205 lb 9.5 oz (93.256 kg)  SpO2: 96% 98% 96% 98%   Weight change: -14.5 oz (-0.412 kg)  Intake/Output Summary (Last 24 hours) at 11/21/14 0735 Last data filed at 11/21/14 0303  Gross per 24 hour  Intake    250 ml  Output   1100 ml  Net   -850 ml   Physical Exam: General: AA male, alert, cooperative, NAD. HEENT: PERRL, EOMI. Moist mucus membranes Neck: Full range of motion without pain, supple, no lymphadenopathy or carotid bruits Lungs: Clear to ascultation bilaterally, normal work of respiration, no wheezes, rales, rhonchi Heart: RRR, no murmurs, gallops, or rubs Abdomen: Soft, non-tender, non-distended, BS + Extremities: No cyanosis, clubbing, or edema Neurologic: Alert & oriented x3, cranial nerves II-XII intact, strength grossly intact, sensation intact to light touch   Lab Results: Basic Metabolic Panel:  Recent Labs Lab 11/20/14 0311 11/21/14 0420  NA 137 137  K 3.9 4.0  CL 106 106  CO2 20* 23  GLUCOSE 105* 130*  BUN 17 13  CREATININE 1.09 0.99  CALCIUM 8.8* 9.0   Liver Function Tests:  Recent Labs Lab 11/19/14 1607  AST 23  ALT 15*  ALKPHOS 87  BILITOT 0.7  PROT 8.3*  ALBUMIN 3.4*   CBC:  Recent Labs Lab 11/19/14 1607  WBC 7.6  HGB 13.8  HCT 41.8  MCV 86.7  PLT 193   Cardiac Enzymes:  Recent Labs Lab 11/19/14 2130 11/20/14 0311 11/20/14 0927  TROPONINI <0.03 <0.03 <0.03   CBG:  Recent Labs Lab 11/19/14 1632 11/20/14 2034  GLUCAP 90 115*   Fasting Lipid Panel:  Recent Labs Lab 11/20/14 1432  CHOL 169  HDL 29*  LDLCALC 112*  TRIG 142  CHOLHDL 5.8    Urine Drug Screen: Drugs  of Abuse     Component Value Date/Time   LABOPIA NONE DETECTED 11/19/2014 1650   LABOPIA NEG 05/31/2013 1625   COCAINSCRNUR NONE DETECTED 11/19/2014 1650   COCAINSCRNUR NEG 05/31/2013 1625   LABBENZ NONE DETECTED 11/19/2014 1650   LABBENZ NEG 05/31/2013 1625   LABBENZ NEGATIVE 03/09/2012 0741   AMPHETMU NONE DETECTED 11/19/2014 1650   AMPHETMU NEG 05/31/2013 1625   AMPHETMU NEGATIVE 03/09/2012 0741   THCU NONE DETECTED 11/19/2014 1650   THCU NEG 05/31/2013 1625   LABBARB NONE DETECTED 11/19/2014 1650   LABBARB NEG 05/31/2013 1625    Urinalysis:  Recent Labs Lab 11/19/14 1650  COLORURINE YELLOW  LABSPEC 1.023  PHURINE 5.0  GLUCOSEU >1000*  HGBUR NEGATIVE  BILIRUBINUR NEGATIVE  KETONESUR 15*  PROTEINUR NEGATIVE  UROBILINOGEN 1.0  NITRITE NEGATIVE  LEUKOCYTESUR NEGATIVE   Studies/Results: Dg Chest 2 View  11/19/2014   CLINICAL DATA:  Syncope, headache  EXAM: CHEST  2 VIEW  COMPARISON:  Five/ 2/15  FINDINGS: Cardiomediastinal silhouette is stable. No acute infiltrate or pleural effusion. No pulmonary edema. Stable bilateral basilar atelectasis or scarring.  IMPRESSION: No active cardiopulmonary disease.   Electronically Signed   By: Lahoma Crocker M.D.   On: 11/19/2014 17:08   Ct Head Wo  Contrast  11/19/2014   CLINICAL DATA:  Altered mental status.  Fall.  EXAM: CT HEAD WITHOUT CONTRAST  TECHNIQUE: Contiguous axial images were obtained from the base of the skull through the vertex without intravenous contrast.  COMPARISON:  MRI 03/09/2012.  FINDINGS: No mass lesion, mass effect, midline shift, hydrocephalus, hemorrhage. No acute territorial cortical ischemia/infarct. Atrophy and chronic ischemic white matter disease is present. Carotid atherosclerosis is present. Mild paranasal sinus disease.  IMPRESSION: Atrophy and chronic ischemic white matter disease without acute intracranial abnormality.   Electronically Signed   By: Dereck Ligas M.D.   On: 11/19/2014 18:22   Mr Jodene Nam Head Wo  Contrast  11/20/2014   CLINICAL DATA:  Initial evaluation for acute syncope, aphasia.  EXAM: MRI HEAD WITHOUT CONTRAST  MRA HEAD WITHOUT CONTRAST  TECHNIQUE: Multiplanar, multiecho pulse sequences of the brain and surrounding structures were obtained without intravenous contrast. Angiographic images of the head were obtained using MRA technique without contrast.  COMPARISON:  Prior CT from 11/19/2014 as well as previous MRI from 03/09/2012.  FINDINGS: MRI HEAD FINDINGS  Diffuse prominence of the CSF containing spaces is compatible with generalized cerebral atrophy. Patchy and confluent T2/FLAIR hyperintensity within the periventricular and deep white matter both cerebral hemispheres most consistent with chronic small vessel ischemic disease, mild to moderate nature.  Remote lacunar infarct present within the high and posterior left frontal lobe (series 8, image 23). Additional remote infarcts present within the periventricular white matter of the left corona radiata.  There are scattered foci of patchy restricted diffusion involving the cortical gray matter of the right parietal lobe (series 3, image 40, 36). Additional small cortical infarct present more anteriorly within the right frontal lobe (series 3, image 38, 34) there is patchy ischemic infarct involving the right caudate head (series 3, image 26). Additional infarct involving the right occipital lobe (series 3, image 19). Additional tiny possible infarct within the right middle cerebral peduncle (series 3, image 18). These are all likely within the right MCA territory distribution. No associated hemorrhage or significant mass effect.  No mass lesion or midline shift. No hydrocephalus. No extra-axial fluid collection.  Craniocervical junction within normal limits. Pituitary gland normal. No acute abnormality about the orbits.  Mild opacity seen layering within the sphenoid sinuses bilaterally. Paranasal sinuses are otherwise clear. No mastoid effusion. Inner  ear structures normal.  Bone marrow signal intensity within normal limits. Mild degenerative changes noted within the visualized upper cervical spine. Scalp soft tissues within normal limits.  MRA HEAD FINDINGS  ANTERIOR CIRCULATION:  Visualized distal cervical segments of the internal carotid arteries are widely patent with antegrade flow. The left ICA is somewhat diminutive as compared to the right. Petrous segments and cavernous segments are patent bilaterally. Again, there is occlusion of the supraclinoid left ICA, similar to prior. Supraclinoid right ICA is widely patent. There is a widely patent right A1 segment supplying both anterior cerebral arteries which are well opacified. Right M1 segment widely patent without stenosis or occlusion. MCA bifurcation on the right is normal. Distal MCA branches on the right are well opacified.  Left MCA artery and its branches are not visualize, stable from prior.  POSTERIOR CIRCULATION:  Both vertebral arteries are widely patent to the vertebrobasilar junction. Posterior inferior cerebral arteries not well evaluated on this exam. Basilar artery widely patent. Right anterior inferior cerebellar artery is dominant. Superior cerebellar arteries patent bilaterally. Right posterior cerebral artery widely patent. There is fetal origin of the left PCA with widely  patent left posterior communicating artery. This is supplied via the left ICA just prior to its occlusion at its supraclinoid segment. Left PCA is widely patent.  No aneurysm.  IMPRESSION: MRI HEAD IMPRESSION:  1. Patchy acute ischemic infarcts involving the cortical gray matter of the right frontal parietal lobes as well as the right occipital lobe, with additional infarct involving the right caudate head. These infarcts are in a right MCA territory distribution. No associated hemorrhage or significant mass effect. 2. Remote bilateral frontal lobe infarcts as detailed above. 3. Mild atrophy with chronic small vessel  ischemic disease.  MRA HEAD IMPRESSION:  1. Chronic occlusion of the supraclinoid left ICA with absent flow within the left middle cerebral artery and its branches distally. This is stable relative to previous MRA from 2013. 2. No new proximal branch occlusion or hemodynamically significant stenosis identified. 3. Anterior cerebral arteries supplied via a widely patent right A1 segment. 4. Fetal origin of the left PCA, supplied via the left internal carotid artery just prior to its occlusion at its supraclinoid segment.   Electronically Signed   By: Jeannine Boga M.D.   On: 11/20/2014 02:20   Mr Brain Wo Contrast  11/20/2014   CLINICAL DATA:  Initial evaluation for acute syncope, aphasia.  EXAM: MRI HEAD WITHOUT CONTRAST  MRA HEAD WITHOUT CONTRAST  TECHNIQUE: Multiplanar, multiecho pulse sequences of the brain and surrounding structures were obtained without intravenous contrast. Angiographic images of the head were obtained using MRA technique without contrast.  COMPARISON:  Prior CT from 11/19/2014 as well as previous MRI from 03/09/2012.  FINDINGS: MRI HEAD FINDINGS  Diffuse prominence of the CSF containing spaces is compatible with generalized cerebral atrophy. Patchy and confluent T2/FLAIR hyperintensity within the periventricular and deep white matter both cerebral hemispheres most consistent with chronic small vessel ischemic disease, mild to moderate nature.  Remote lacunar infarct present within the high and posterior left frontal lobe (series 8, image 23). Additional remote infarcts present within the periventricular white matter of the left corona radiata.  There are scattered foci of patchy restricted diffusion involving the cortical gray matter of the right parietal lobe (series 3, image 40, 36). Additional small cortical infarct present more anteriorly within the right frontal lobe (series 3, image 38, 34) there is patchy ischemic infarct involving the right caudate head (series 3, image 26).  Additional infarct involving the right occipital lobe (series 3, image 19). Additional tiny possible infarct within the right middle cerebral peduncle (series 3, image 18). These are all likely within the right MCA territory distribution. No associated hemorrhage or significant mass effect.  No mass lesion or midline shift. No hydrocephalus. No extra-axial fluid collection.  Craniocervical junction within normal limits. Pituitary gland normal. No acute abnormality about the orbits.  Mild opacity seen layering within the sphenoid sinuses bilaterally. Paranasal sinuses are otherwise clear. No mastoid effusion. Inner ear structures normal.  Bone marrow signal intensity within normal limits. Mild degenerative changes noted within the visualized upper cervical spine. Scalp soft tissues within normal limits.  MRA HEAD FINDINGS  ANTERIOR CIRCULATION:  Visualized distal cervical segments of the internal carotid arteries are widely patent with antegrade flow. The left ICA is somewhat diminutive as compared to the right. Petrous segments and cavernous segments are patent bilaterally. Again, there is occlusion of the supraclinoid left ICA, similar to prior. Supraclinoid right ICA is widely patent. There is a widely patent right A1 segment supplying both anterior cerebral arteries which are well opacified. Right M1 segment  widely patent without stenosis or occlusion. MCA bifurcation on the right is normal. Distal MCA branches on the right are well opacified.  Left MCA artery and its branches are not visualize, stable from prior.  POSTERIOR CIRCULATION:  Both vertebral arteries are widely patent to the vertebrobasilar junction. Posterior inferior cerebral arteries not well evaluated on this exam. Basilar artery widely patent. Right anterior inferior cerebellar artery is dominant. Superior cerebellar arteries patent bilaterally. Right posterior cerebral artery widely patent. There is fetal origin of the left PCA with widely  patent left posterior communicating artery. This is supplied via the left ICA just prior to its occlusion at its supraclinoid segment. Left PCA is widely patent.  No aneurysm.  IMPRESSION: MRI HEAD IMPRESSION:  1. Patchy acute ischemic infarcts involving the cortical gray matter of the right frontal parietal lobes as well as the right occipital lobe, with additional infarct involving the right caudate head. These infarcts are in a right MCA territory distribution. No associated hemorrhage or significant mass effect. 2. Remote bilateral frontal lobe infarcts as detailed above. 3. Mild atrophy with chronic small vessel ischemic disease.  MRA HEAD IMPRESSION:  1. Chronic occlusion of the supraclinoid left ICA with absent flow within the left middle cerebral artery and its branches distally. This is stable relative to previous MRA from 2013. 2. No new proximal branch occlusion or hemodynamically significant stenosis identified. 3. Anterior cerebral arteries supplied via a widely patent right A1 segment. 4. Fetal origin of the left PCA, supplied via the left internal carotid artery just prior to its occlusion at its supraclinoid segment.   Electronically Signed   By: Jeannine Boga M.D.   On: 11/20/2014 02:20   Medications: I have reviewed the patient's current medications. Scheduled Meds: . aspirin EC  81 mg Oral Daily  . cephALEXin  500 mg Oral QID  . citalopram  20 mg Oral Daily  . Darunavir Ethanolate  800 mg Oral Q breakfast  . elvitegravir-cobicistat-emtricitabine-tenofovir  1 tablet Oral Q breakfast  . enoxaparin (LOVENOX) injection  40 mg Subcutaneous Q24H  . mometasone-formoterol  2 puff Inhalation BID  . QUEtiapine  200 mg Oral QHS  . terbinafine  250 mg Oral Daily  . tiotropium  18 mcg Inhalation Daily  . valACYclovir  500 mg Oral Daily   Continuous Infusions:  PRN Meds:.acetaminophen, albuterol, hydroxypropyl methylcellulose / hypromellose    Assessment/Plan: 59 y/o M w/ PMHx of  HTN, HIV (last CD4 400, VL <20, 11/12/14), Hep C, h/o CVA in 2013, and depression, presented to the ED after an episode of syncope and dizziness, admitted for TIA workup.   Right-Sided Ischemic Stroke: MRI on admission showed patchy acute ischemic infarcts involving the cortical gray matter of the right frontal parietal lobes as well as the right occipital lobe, with additional infarct involving the right caudate head. All within right MCA distribution. No associated hemorrhage or mass effect. MRA was also significant for chronic occlusion of the supraclinoid left ICA with absent flow within the left MCA and its branches distally. This findings is stable compared to MRA in 2013. CA doppler w/ 1-39% stenosis bilaterally and antegrade flow in the vertebral arteries. EEG negative. No PT/OT follow up -ECHO still pending -Will arrange for 30 day Holter monitoring -Continue ASA 81 -Start Pravachol 40 mg qhs  HIV: At home, patient is on [name] and [place]. VL <20, CD4 400 in 10/2014.  -Continue home regimen  COPD: Stable.  -Albuterol neb prn -Continue Dulera and Spiriva  HTN: Stable. On metoprolol 25 mg twice a day, lisinopril 20 mg daily at home.  -Hold home meds for permissive HTN; restart on discharge  Sialadenitis: Presented to the emergency department on 11/15/2014 with symptoms concerning for Sialadenitis.  -Continue 10 day course of Keflex (started on 11/15/2014).   DM type II: Normoglycemic upon initial evaluation. At home, patient is on glipizide 5 mg daily (started in April 2016). Patient's HbA1c was 6.6 in 10/2014. -ISS; restart Glipizide on discharge.   Depression: Stable.  -Continue Seroquel 200 mg daily at bedtime.  -Celexa 20 mg qd  Tinea Infection: Patient w/ fungal infection of finger and toe nails, started Terbinafine in clinic on 10/31/14 for this.  -Continue Terbinafine  Genital Herpes: Stable.  -Continue Valtrex  Dispo: Disposition is deferred at this time, awaiting  improvement of current medical problems.  Anticipated discharge today.   The patient does have a current PCP Jessee Avers, MD) and does need an [facility] hospital follow-up appointment after discharge.  The patient does not have transportation limitations that hinder transportation to clinic appointments.  .Services Needed at time of discharge: Y = Yes, Blank = No PT:   OT:   RN:   Equipment:   Other:     LOS: 2 days   Corky Sox, MD 11/21/2014, 7:35 AM

## 2014-11-21 NOTE — Progress Notes (Addendum)
STROKE TEAM PROGRESS NOTE   HISTORY Jacob Joseph is an 59 y.o. male with a past medical history that is pertinent for HTN, DM type II, bilateral cortical infarcts without residual deficits, HIV disease, hepatitis C, polysubstance abuse, and smoking, brought in for further evaluation of transient vertigo and dysarthria, concern for TIA. Patient stated that this afternoon he was walking in the supermarket in company of his wife when suddenly " thing started rotating, moving around, and I went down". He said that he did not have loss of awareness but was unable to think clearly say what he was thinking or form words. (LKW 11/19/2014 at 1500). Of note, review of his hospital record showed that " his wife states that there was a period of 10-15 seconds when the patient was on the ground and staring off into space and was not responsive". No abnormal movements noted, bladder or bowel incontinence. Denies HA, double vision, difficulty seeing, or feeling sweaty or clammy before collapsing to the floor. He said that the whole episode lasted for approximately 45 minutes. Patient not found to have orthostatic hypotension upon initial evaluation in the emergency department. CT brain was performed and showed no acute abnormality. Routine serologies including UDS are unrevealing. Patient was not administered TPA secondary to symptoms resolved. He was admitted for further evaluation and treatment.   SUBJECTIVE (INTERVAL HISTORY) History accompanied by his wife. He is feeling fine and wants to go home. He has no complaints.  OBJECTIVE Temp:  [98.2 F (36.8 C)-99.2 F (37.3 C)] 99.2 F (37.3 C) (05/04 1334) Pulse Rate:  [77-85] 82 (05/04 1334) Cardiac Rhythm:  [-] Normal sinus rhythm (05/04 0758) Resp:  [16-20] 16 (05/04 1334) BP: (142-146)/(64-89) 146/64 mmHg (05/04 1334) SpO2:  [95 %-100 %] 95 % (05/04 1334) Weight:  [205 lb 9.5 oz (93.256 kg)] 205 lb 9.5 oz (93.256 kg) (05/04 0400)   Recent Labs Lab  11/19/14 1632 11/20/14 2034 11/21/14 0805 11/21/14 1138  GLUCAP 90 115* 122* 143*    Recent Labs Lab 11/19/14 1607 11/20/14 0311 11/21/14 0420  NA 137 137 137  K 4.4 3.9 4.0  CL 108 106 106  CO2 20* 20* 23  GLUCOSE 93 105* 130*  BUN 16 17 13   CREATININE 1.18 1.09 0.99  CALCIUM 9.0 8.8* 9.0    Recent Labs Lab 11/19/14 1607  AST 23  ALT 15*  ALKPHOS 87  BILITOT 0.7  PROT 8.3*  ALBUMIN 3.4*    Recent Labs Lab 11/19/14 1607  WBC 7.6  HGB 13.8  HCT 41.8  MCV 86.7  PLT 193    Recent Labs Lab 11/19/14 1607 11/19/14 2130 11/20/14 0311 11/20/14 0927  TROPONINI <0.03 <0.03 <0.03 <0.03   No results for input(s): LABPROT, INR in the last 72 hours.  Recent Labs  11/19/14 1650  COLORURINE YELLOW  LABSPEC 1.023  PHURINE 5.0  GLUCOSEU >1000*  HGBUR NEGATIVE  BILIRUBINUR NEGATIVE  KETONESUR 15*  PROTEINUR NEGATIVE  UROBILINOGEN 1.0  NITRITE NEGATIVE  LEUKOCYTESUR NEGATIVE       Component Value Date/Time   CHOL 169 11/20/2014 1432   TRIG 142 11/20/2014 1432   HDL 29* 11/20/2014 1432   CHOLHDL 5.8 11/20/2014 1432   VLDL 28 11/20/2014 1432   LDLCALC 112* 11/20/2014 1432   Lab Results  Component Value Date   HGBA1C 6.6 10/31/2014      Component Value Date/Time   LABOPIA NONE DETECTED 11/19/2014 1650   LABOPIA NEG 05/31/2013 1625   COCAINSCRNUR NONE DETECTED 11/19/2014 1650  COCAINSCRNUR NEG 05/31/2013 1625   LABBENZ NONE DETECTED 11/19/2014 1650   LABBENZ NEG 05/31/2013 1625   LABBENZ NEGATIVE 03/09/2012 0741   AMPHETMU NONE DETECTED 11/19/2014 1650   AMPHETMU NEG 05/31/2013 1625   AMPHETMU NEGATIVE 03/09/2012 0741   THCU NONE DETECTED 11/19/2014 1650   THCU NEG 05/31/2013 1625   LABBARB NONE DETECTED 11/19/2014 1650   LABBARB NEG 05/31/2013 1625    No results for input(s): ETH in the last 168 hours.  Dg Chest 2 View  11/19/2014   CLINICAL DATA:  Syncope, headache  EXAM: CHEST  2 VIEW  COMPARISON:  Five/ 2/15  FINDINGS:  Cardiomediastinal silhouette is stable. No acute infiltrate or pleural effusion. No pulmonary edema. Stable bilateral basilar atelectasis or scarring.  IMPRESSION: No active cardiopulmonary disease.   Electronically Signed   By: Lahoma Crocker M.D.   On: 11/19/2014 17:08   Ct Head Wo Contrast  11/19/2014   CLINICAL DATA:  Altered mental status.  Fall.  EXAM: CT HEAD WITHOUT CONTRAST  TECHNIQUE: Contiguous axial images were obtained from the base of the skull through the vertex without intravenous contrast.  COMPARISON:  MRI 03/09/2012.  FINDINGS: No mass lesion, mass effect, midline shift, hydrocephalus, hemorrhage. No acute territorial cortical ischemia/infarct. Atrophy and chronic ischemic white matter disease is present. Carotid atherosclerosis is present. Mild paranasal sinus disease.  IMPRESSION: Atrophy and chronic ischemic white matter disease without acute intracranial abnormality.   Electronically Signed   By: Dereck Ligas M.D.   On: 11/19/2014 18:22   Mr Jodene Nam Head Wo Contrast  11/20/2014   CLINICAL DATA:  Initial evaluation for acute syncope, aphasia.  EXAM: MRI HEAD WITHOUT CONTRAST  MRA HEAD WITHOUT CONTRAST  TECHNIQUE: Multiplanar, multiecho pulse sequences of the brain and surrounding structures were obtained without intravenous contrast. Angiographic images of the head were obtained using MRA technique without contrast.  COMPARISON:  Prior CT from 11/19/2014 as well as previous MRI from 03/09/2012.  FINDINGS: MRI HEAD FINDINGS  Diffuse prominence of the CSF containing spaces is compatible with generalized cerebral atrophy. Patchy and confluent T2/FLAIR hyperintensity within the periventricular and deep white matter both cerebral hemispheres most consistent with chronic small vessel ischemic disease, mild to moderate nature.  Remote lacunar infarct present within the high and posterior left frontal lobe (series 8, image 23). Additional remote infarcts present within the periventricular white matter  of the left corona radiata.  There are scattered foci of patchy restricted diffusion involving the cortical gray matter of the right parietal lobe (series 3, image 40, 36). Additional small cortical infarct present more anteriorly within the right frontal lobe (series 3, image 38, 34) there is patchy ischemic infarct involving the right caudate head (series 3, image 26). Additional infarct involving the right occipital lobe (series 3, image 19). Additional tiny possible infarct within the right middle cerebral peduncle (series 3, image 18). These are all likely within the right MCA territory distribution. No associated hemorrhage or significant mass effect.  No mass lesion or midline shift. No hydrocephalus. No extra-axial fluid collection.  Craniocervical junction within normal limits. Pituitary gland normal. No acute abnormality about the orbits.  Mild opacity seen layering within the sphenoid sinuses bilaterally. Paranasal sinuses are otherwise clear. No mastoid effusion. Inner ear structures normal.  Bone marrow signal intensity within normal limits. Mild degenerative changes noted within the visualized upper cervical spine. Scalp soft tissues within normal limits.  MRA HEAD FINDINGS  ANTERIOR CIRCULATION:  Visualized distal cervical segments of the internal carotid arteries  are widely patent with antegrade flow. The left ICA is somewhat diminutive as compared to the right. Petrous segments and cavernous segments are patent bilaterally. Again, there is occlusion of the supraclinoid left ICA, similar to prior. Supraclinoid right ICA is widely patent. There is a widely patent right A1 segment supplying both anterior cerebral arteries which are well opacified. Right M1 segment widely patent without stenosis or occlusion. MCA bifurcation on the right is normal. Distal MCA branches on the right are well opacified.  Left MCA artery and its branches are not visualize, stable from prior.  POSTERIOR CIRCULATION:  Both  vertebral arteries are widely patent to the vertebrobasilar junction. Posterior inferior cerebral arteries not well evaluated on this exam. Basilar artery widely patent. Right anterior inferior cerebellar artery is dominant. Superior cerebellar arteries patent bilaterally. Right posterior cerebral artery widely patent. There is fetal origin of the left PCA with widely patent left posterior communicating artery. This is supplied via the left ICA just prior to its occlusion at its supraclinoid segment. Left PCA is widely patent.  No aneurysm.  IMPRESSION: MRI HEAD IMPRESSION:  1. Patchy acute ischemic infarcts involving the cortical gray matter of the right frontal parietal lobes as well as the right occipital lobe, with additional infarct involving the right caudate head. These infarcts are in a right MCA territory distribution. No associated hemorrhage or significant mass effect. 2. Remote bilateral frontal lobe infarcts as detailed above. 3. Mild atrophy with chronic small vessel ischemic disease.  MRA HEAD IMPRESSION:  1. Chronic occlusion of the supraclinoid left ICA with absent flow within the left middle cerebral artery and its branches distally. This is stable relative to previous MRA from 2013. 2. No new proximal branch occlusion or hemodynamically significant stenosis identified. 3. Anterior cerebral arteries supplied via a widely patent right A1 segment. 4. Fetal origin of the left PCA, supplied via the left internal carotid artery just prior to its occlusion at its supraclinoid segment.   Electronically Signed   By: Jeannine Boga M.D.   On: 11/20/2014 02:20   Mr Brain Wo Contrast  11/20/2014   CLINICAL DATA:  Initial evaluation for acute syncope, aphasia.  EXAM: MRI HEAD WITHOUT CONTRAST  MRA HEAD WITHOUT CONTRAST  TECHNIQUE: Multiplanar, multiecho pulse sequences of the brain and surrounding structures were obtained without intravenous contrast. Angiographic images of the head were obtained using  MRA technique without contrast.  COMPARISON:  Prior CT from 11/19/2014 as well as previous MRI from 03/09/2012.  FINDINGS: MRI HEAD FINDINGS  Diffuse prominence of the CSF containing spaces is compatible with generalized cerebral atrophy. Patchy and confluent T2/FLAIR hyperintensity within the periventricular and deep white matter both cerebral hemispheres most consistent with chronic small vessel ischemic disease, mild to moderate nature.  Remote lacunar infarct present within the high and posterior left frontal lobe (series 8, image 23). Additional remote infarcts present within the periventricular white matter of the left corona radiata.  There are scattered foci of patchy restricted diffusion involving the cortical gray matter of the right parietal lobe (series 3, image 40, 36). Additional small cortical infarct present more anteriorly within the right frontal lobe (series 3, image 38, 34) there is patchy ischemic infarct involving the right caudate head (series 3, image 26). Additional infarct involving the right occipital lobe (series 3, image 19). Additional tiny possible infarct within the right middle cerebral peduncle (series 3, image 18). These are all likely within the right MCA territory distribution. No associated hemorrhage or significant mass effect.  No mass lesion or midline shift. No hydrocephalus. No extra-axial fluid collection.  Craniocervical junction within normal limits. Pituitary gland normal. No acute abnormality about the orbits.  Mild opacity seen layering within the sphenoid sinuses bilaterally. Paranasal sinuses are otherwise clear. No mastoid effusion. Inner ear structures normal.  Bone marrow signal intensity within normal limits. Mild degenerative changes noted within the visualized upper cervical spine. Scalp soft tissues within normal limits.  MRA HEAD FINDINGS  ANTERIOR CIRCULATION:  Visualized distal cervical segments of the internal carotid arteries are widely patent with  antegrade flow. The left ICA is somewhat diminutive as compared to the right. Petrous segments and cavernous segments are patent bilaterally. Again, there is occlusion of the supraclinoid left ICA, similar to prior. Supraclinoid right ICA is widely patent. There is a widely patent right A1 segment supplying both anterior cerebral arteries which are well opacified. Right M1 segment widely patent without stenosis or occlusion. MCA bifurcation on the right is normal. Distal MCA branches on the right are well opacified.  Left MCA artery and its branches are not visualize, stable from prior.  POSTERIOR CIRCULATION:  Both vertebral arteries are widely patent to the vertebrobasilar junction. Posterior inferior cerebral arteries not well evaluated on this exam. Basilar artery widely patent. Right anterior inferior cerebellar artery is dominant. Superior cerebellar arteries patent bilaterally. Right posterior cerebral artery widely patent. There is fetal origin of the left PCA with widely patent left posterior communicating artery. This is supplied via the left ICA just prior to its occlusion at its supraclinoid segment. Left PCA is widely patent.  No aneurysm.  IMPRESSION: MRI HEAD IMPRESSION:  1. Patchy acute ischemic infarcts involving the cortical gray matter of the right frontal parietal lobes as well as the right occipital lobe, with additional infarct involving the right caudate head. These infarcts are in a right MCA territory distribution. No associated hemorrhage or significant mass effect. 2. Remote bilateral frontal lobe infarcts as detailed above. 3. Mild atrophy with chronic small vessel ischemic disease.  MRA HEAD IMPRESSION:  1. Chronic occlusion of the supraclinoid left ICA with absent flow within the left middle cerebral artery and its branches distally. This is stable relative to previous MRA from 2013. 2. No new proximal branch occlusion or hemodynamically significant stenosis identified. 3. Anterior  cerebral arteries supplied via a widely patent right A1 segment. 4. Fetal origin of the left PCA, supplied via the left internal carotid artery just prior to its occlusion at its supraclinoid segment.   Electronically Signed   By: Jeannine Boga M.D.   On: 11/20/2014 02:20   Carotid Doppler  There is 1-39% bilateral ICA stenosis. Vertebral artery flow is antegrade.     PHYSICAL EXAM  . Afebrile. Head is nontraumatic. Neck is supple without bruit.    Cardiac exam no murmur or gallop. Lungs are clear to auscultation. Distal pulses are well felt. Neurological Exam ;  Awake  Alert oriented x 3. Normal speech and language.eye movements full without nystagmus.fundi were not visualized. Vision acuity and fields appear normal. Hearing is normal. Palatal movements are normal. Face symmetric. Tongue midline. Normal strength, tone, reflexes and coordination. Normal sensation. Gait deferred.ASSESSMENT/PLAN Jacob Joseph is a 59 y.o. male with history of HTN, DM type II, bilateral cortical infarcts without residual deficits, HIV disease, hepatitis C, polysubstance abuse, and smoking presenting with transient vertigo and dysarthria. He did not receive IV t-PA due to resolved symptoms.   Stroke:  R MCA patchy infarcts secondary to embolic etiology  and source yet undetermined.  Resultant  Neuro deficits resolved  MRI  Patchy cortical right frontal parietal lobes and right occipital lobe and right caudate head infarcts (R MCA). Old bilateral frontal lobe infarcts. Mild atrophy with chronic small vessel ischemic disease.  MRA  Chronic occlusion of supraclinoid left ICA with no flow L MCA. Fetal origin left PCA  Carotid Doppler  No significant stenosis   2D Echo  pending   TEE 2013 - no SOE, no PFO  HgbA1c 6.6  Lovenox 40 mg sq daily for VTE prophylaxis Diet heart healthy/carb modified Room service appropriate?: Yes; Fluid consistency:: Thin  no antithrombotic prior to admission, now on  aspirin 81 mg orally every day  Patient counseled to be compliant with his antithrombotic medications  Ongoing aggressive stroke risk factor management  Therapy recommendations:  No PT, OT pending  Disposition:  Anticipate return home  Hypertension  Home meds:   Lisinopril, metoprolol  Stable  Hyperlipidemia  Home meds:  No statin due to intolerance hx  Recommend checking lipids  LDL goal < 70  Other Stroke Risk Factors  Cigarette smoker, 20.5 year pack hx, advised to stop smoking  Cocaine and Marijuana use  Hx stroke/TIA - seen by Stroke Team at that time - 11/2011 Left parietal white matter and left superior temporal cortex as well as subacute infarcts in the right deep white matter in a watershed distribution. Infarcts secondary to terminal L ICA and M1 occlusion due to unknown embolic source. Likely due to arrythmia associated with cocaine abuse. Had R hp which resolved  HIV  Other Active Problems  COPD  Sialadenitis  Depression  Onychomycosis   Genital herpes  Polysubstance abuse  Other Pertinent History  Latent TB  Hepatitis C  Hospital day # 2 I have personally examined this patient, reviewed notes, independently viewed imaging studies, participated in medical decision making and plan of care. I have made any additions or clarifications directly to the above note. Agree with note above. He presented with transient episode of dizziness, brief loss of consciousness and speech difficulty possibly a syncopal event versus vertebral basilar TIA. He remains at risk for recurrent strokes/TIAs and neurological worsening and needs ongoing stroke evaluation and aggressive risk factor control.iven prior history  And cocaine abuse and strong suspicion for cardiac source of embolism versus arrhythmia. Patient counseled to quit smoking and to be compliant with aspirin. Check 2-D echo results. We will not repeat TEE since he had 18 2013. Consider 30 day outpatient cardiac  monitor at discharge.D/W Dr Daryll Drown and answered questions. Stroke service will sign off. Kindly call for questions. Antony Contras, MD Medical Director [facility] Pager: [phone] 11/21/2014 2:57 PM      To contact Stroke Continuity provider, please refer to [email or web address] After hours, contact General Neurology

## 2014-11-21 NOTE — Progress Notes (Signed)
Inpatient Progress Note - Internal Medicine  Subjective: Pt is resting comfortably today, alert and conversant with no complaints. He denies headache, vision changes, slurred speech, confusion, numbness/tingling, weakness, chest pain, abdominal pain.  Objective: Vital signs in last 24 hours: Filed Vitals:   11/20/14 1500 11/20/14 2000 11/20/14 2002 11/21/14 0400  BP: 146/81 143/89  142/70  Pulse: 85 77  80  Temp: 98.2 F (36.8 C) 98.4 F (36.9 C)  98.4 F (36.9 C)  TempSrc: Oral Oral  Oral  Resp:  20  20  Height:      Weight:    205 lb 9.5 oz (93.256 kg)  SpO2: 96% 98% 96% 98%   Weight change: -14.5 oz (-0.412 kg)  Intake/Output Summary (Last 24 hours) at 11/21/14 0831 Last data filed at 11/21/14 0800  Gross per 24 hour  Intake    250 ml  Output   1300 ml  Net  -1050 ml   BP 142/70 mmHg  Pulse 80  Temp(Src) 98.4 F (36.9 C) (Oral)  Resp 20  Ht 6' 0.01" (1.829 m)  Wt 205 lb 9.5 oz (93.256 kg)  BMI 27.88 kg/m2  SpO2 98%  General Appearance:    Alert, cooperative, no distress, appears stated age  Head:    Normocephalic, without obvious abnormality, atraumatic  Eyes:    EOMI  Throat:   mmm  Lungs:     Clear to auscultation bilaterally, respirations unlabored  Heart:    Regular rate and rhythm, S1 and S2 normal, no murmur, rub   or gallop  Abdomen:     Soft, non-tender, +bowel sounds  Extremities:   Extremities normal, atraumatic, no cyanosis or edema. Nail discoloration/thickening R hand, feet.  Pulses:   2+ bilateral dorsal pedal pulses  Neurologic:   CNII-XII intact. Normal strength in BUE/BLE. Full sensation to light touch in hands/feet bilaterally.   Lab Results: Basic Metabolic Panel:  Recent Labs Lab 11/20/14 0311 11/21/14 0420  NA 137 137  K 3.9 4.0  CL 106 106  CO2 20* 23  GLUCOSE 105* 130*  BUN 17 13  CREATININE 1.09 0.99  CALCIUM 8.8* 9.0   Liver Function Tests:  Recent Labs Lab 11/19/14 1607  AST 23  ALT 15*  ALKPHOS 87  BILITOT 0.7   PROT 8.3*  ALBUMIN 3.4*   CBG:  Recent Labs Lab 11/19/14 1632 11/20/14 2034 11/21/14 0805  GLUCAP 90 115* 122*   Fasting Lipid Panel:  Recent Labs Lab 11/20/14 1432  CHOL 169  HDL 29*  LDLCALC 112*  TRIG 142  CHOLHDL 5.8   Studies/Results: CT Head w/o contrast 11/19/2014 Atrophy and chronic ischemic white matter disease without acute intracranial abnormality.  MRI Brain w/o contrast 11/20/2014 1. Patchy acute ischemic infarcts involving cortical gray matter of R frontal, parietal lobes and R occipital lobe. Additional infarct in R caudate head. Infarcts are all in R MCA territory distribution. No associated hemorrhage or significant mass effect. 2. Remote bilateral frontal lobe infarcts 3. Mild atrophy with chronic small vessel ischemic disease  MRA Brain w/o contrast 11/20/2014 Chronic occlusion of L ICA with absent flow in L MCA and its distal branches. No new proximal branch occlusion or hemodynamically significant stenosis noted   Medications: I have reviewed the patient's current medications. Scheduled Meds: . aspirin EC  81 mg Oral Daily  . cephALEXin  500 mg Oral QID  . citalopram  20 mg Oral Daily  . Darunavir Ethanolate  800 mg Oral Q breakfast  . elvitegravir-cobicistat-emtricitabine-tenofovir  1 tablet Oral Q breakfast  . enoxaparin (LOVENOX) injection  40 mg Subcutaneous Q24H  . mometasone-formoterol  2 puff Inhalation BID  . QUEtiapine  200 mg Oral QHS  . terbinafine  250 mg Oral Daily  . tiotropium  18 mcg Inhalation Daily  . valACYclovir  500 mg Oral Daily   Continuous Infusions:  PRN Meds:.acetaminophen, albuterol, hydroxypropyl methylcellulose / hypromellose Assessment/Plan: Principal Problem:   Syncope Active Problems:   Human immunodeficiency virus (HIV) disease   Polysubstance abuse   Essential hypertension   COPD, moderate   Diabetes mellitus, type 2   Faintness  Stroke: MRI findings demonstrate multiple new small infarcts, unclear  etiology. Previous h/o CVA in 2013, pt completed a 3 month course of Plavix, no ASA after. - Neurology consulted, appreciate recs - MRI brain: patchy acute ischemic infarcts in R frontal parietal lobes and R occipital lobe, R caudate head. - MRA head: chronic L ICA occlusion in L MCA stable from MRA in 2013. - EEG 11/20/2014 normal - Carotid dopplers negative for occlusion - TTE today. - Holter monitor today - to be placed by inpatient cardiology. - EKG 5/2 and 5/3 AM: lateral lead TWI. Troponin negative x4. - Continue ASA 81 mg daily - Start pravastatin 40 mg daily. Pt has statin allergy listed due to LFT bump with atorvastatin in 2013. Rosuvastatin contraindicated due to Predaxa. Baseline LFTs 5/2/22016 WNL. - Telemetry - PT has seen pt, recommend no PT needed. OT to see today.  HIV: Home meds Genvoya and [place]. Last viral load undetectable, last CD4 400 in April 2016. - Continue home meds  COPD: Home meds: albuterol inhaler, Dulera, Spiriva. Stable. - Albuterol inhaler prn - Continue home Helyn Numbers  Hypertension: Home meds metoprolol 25 mg BID, lisinopril 20 mg daily - Last BP 142/70, max SBP since admission 146 - antihypertensives held for permissive HTN in the setting of acute infarct  Sialadenitis: pt presented to ED on 11/15/2014, started on 10 days cephalexin - continue 10 day course of cephalexin (D1 = 4/28/12016)  Type II Diabetes: BG 93, 105 since admission. Home meds: glipizide 5 mg daily. Last A1c 6.6 in 10/2014. - Consider SSI for hyperglycemia. Continue POC blood glucose monitoring.  Depression: Home meds: Seroquel 150 mg qHS, citalopra. - Continue home Seroquel at 20 mg - Celexa 20 mg daily  Onychomycosis: Started Terbinafine on 10/31/2014 for fingernail, toenail infection. - Continue Terbinafine  Genital Herpes: Stable. Home meds Valtrex. - Continue Valtrex  Polysubstance Abuse: History of cocaine, crack cocaine, marijuana, EtOH.  - UDS negative on  admission  Hepatitis C: Completed Harvoni treatment 10/20/2014  PPX: enoxaparin 40 mg daily for DVT PPX  Dispo: Disposition is deferred at this time, awaiting improvement of current medical problems.  Anticipated discharge today 11/21/2014.   The patient does have a current PCP Jessee Avers, MD) and does not need an [facility] hospital follow-up appointment after discharge.  The patient does not have transportation limitations that hinder transportation to clinic appointments.  .Services Needed at time of discharge: Y = Yes, Blank = No PT:   OT:   RN:   Equipment:   Other:     LOS: 2 days   Susa Day, Med Student 11/21/2014, 8:31 AM

## 2014-11-21 NOTE — Evaluation (Addendum)
Occupational Therapy Evaluation Patient Details Name: Jacob Joseph MRN: [id number] DOB: Jun 20, 1956 Today's Date: 11/21/2014    History of Present Illness Adm s/p syncopal spell (walking in parking lot, began to feel weak, things began to spin and vision darkened and fell). Orthostatic BPs negative, CT head negative; non-focal exam  PMHx- CVA, HIV, Hep C, COPD, DM, HTN, polysubstance abuse   Clinical Impression   Pt was independent with AD in ADL and IADL prior to admission.  He did not use a device to ambulate.  He is performing at his baseline. Educated in safety and stroke signs and symptoms. No further OT needs identified.  Pt will have 24 hour supervision at home.  OT is signing off.    Follow Up Recommendations  No OT follow up    Equipment Recommendations       Recommendations for Other Services       Precautions / Restrictions Precautions Precautions: Fall Precaution Comments: pt does not like to use AD for ambulation, afraid he will get dependent on it Restrictions Weight Bearing Restrictions: No      Mobility Bed Mobility Overal bed mobility: Independent                Transfers Overall transfer level: Independent Equipment used: None             General transfer comment: instructed pt to sit up momentarily and stand briefly prior to ambulating     Balance                                            ADL Overall ADL's : At baseline                                       General ADL Comments: Pt demonstrated ability to reach feet for LB bathing and dressing, groom at sink, and toilet.  Pt reports routinely taking himself to the bathroom here and self feeding.     Vision  Pt wears reading glasses, denies changes in vision.  Appears to have strabismus. Peripheral fields and ocular movements intact grossly.   Perception     Praxis      Pertinent Vitals/Pain Pain Assessment: No/denies pain     Hand  Dominance Right   Extremity/Trunk Assessment Upper Extremity Assessment Upper Extremity Assessment: Overall WFL for tasks assessed   Lower Extremity Assessment Lower Extremity Assessment: Overall WFL for tasks assessed   Cervical / Trunk Assessment Cervical / Trunk Assessment: Normal   Communication Communication Communication: No difficulties   Cognition Arousal/Alertness: Awake/alert Behavior During Therapy: WFL for tasks assessed/performed Overall Cognitive Status: Within Functional Limits for tasks assessed                     General Comments       Exercises       Shoulder Instructions      Home Living Family/patient expects to be discharged to:: Private residence Living Arrangements: Spouse/significant other Available Help at Discharge: Family;Available 24 hours/day Type of Home: Apartment Home Access: Stairs to enter Entrance Stairs-Number of Steps: 2 Entrance Stairs-Rails: Right;Left;Can reach both Home Layout: One level     Bathroom Shower/Tub: Teacher, early years/pre: Standard     Home Equipment: Bedside commode;Walker - 2 wheels;Shower  seat;Cane - single point;Grab bars - toilet;Grab bars - tub/shower          Prior Functioning/Environment Level of Independence: Independent with assistive device(s)        Comments: uses BSC over toilet and shower chair PTA; walking independently    OT Diagnosis:     OT Problem List:     OT Treatment/Interventions:      OT Goals(Current goals can be found in the care plan section) Acute Rehab OT Goals Patient Stated Goal: home today  OT Frequency:     Barriers to D/C:            Co-evaluation              End of Session    Activity Tolerance: Patient tolerated treatment well Patient left: in bed;with call bell/phone within reach;with family/visitor present (RT)   Time: 4920-1007 OT Time Calculation (min): 17 min Charges:  OT General Charges $OT Visit: 1 Procedure OT  Evaluation $Initial OT Evaluation Tier I: 1 Procedure G-Codes:    Malka So 11/21/2014, 9:07 AM  [phone]

## 2014-11-21 NOTE — Progress Notes (Signed)
  Echocardiogram 2D Echocardiogram has been performed.  Jacob Joseph FRANCES 11/21/2014, 4:13 PM

## 2014-11-21 NOTE — Progress Notes (Signed)
  Date: 11/21/2014  Patient name: Jacob Joseph  Medical record number: [id number]  Date of birth: [birth year]   This patient's plan of care was discussed with the house staff. Please see Dr. Ronnald Ramp' note for complete details. I concur with his findings.  Jacob Joseph is awaiting TTE.  Pending results, could likely go home based on discussion with Neurology team.    Sid Falcon, MD 11/21/2014, 2:09 PM

## 2014-11-21 NOTE — Telephone Encounter (Signed)
Call to patient to confirm appointment for 11/22/14 at 3:30 lmtcb

## 2014-11-22 ENCOUNTER — Ambulatory Visit: Payer: Medicare Other | Admitting: Pharmacist

## 2014-11-22 ENCOUNTER — Ambulatory Visit: Payer: Medicare Other | Admitting: *Deleted

## 2014-11-22 LAB — GLUCOSE, CAPILLARY
GLUCOSE-CAPILLARY: 125 mg/dL — AB (ref 70–99)
Glucose-Capillary: 108 mg/dL — ABNORMAL HIGH (ref 70–99)

## 2014-11-22 NOTE — Progress Notes (Signed)
Inpatient Progress Note - Internal Medicine  Subjective: [name] continues to feel well and at baseline today. He is ready to be discharged. He denies headache, vision changes, numbness, weakness, tingling. His wife, present in the room, has picked up his aspirin and pravastatin from the pharmacy. They have discussed the Holter monitor and will receive it at their home.  Objective: Vital signs in last 24 hours: Filed Vitals:   11/21/14 2038 11/21/14 2353 11/22/14 0536 11/22/14 0700  BP:  134/64 135/72   Pulse:  74 72   Temp:  98 F (36.7 C) 98 F (36.7 C)   TempSrc:  Oral Oral   Resp:      Height:      Weight:   206 lb 14.4 oz (93.849 kg)   SpO2: 96% 97% 100% 98%   Weight change: 1 lb 4.9 oz (0.593 kg)  Intake/Output Summary (Last 24 hours) at 11/22/14 1046 Last data filed at 11/22/14 0811  Gross per 24 hour  Intake   1190 ml  Output    375 ml  Net    815 ml   BP 135/72 mmHg  Pulse 72  Temp(Src) 98 F (36.7 C) (Oral)  Resp 16  Ht 6' 0.01" (1.829 m)  Wt 206 lb 14.4 oz (93.849 kg)  BMI 28.05 kg/m2  SpO2 98%  General Appearance:    Alert and conversant, in no apparent distress  Eyes:    EOMI, sclerae clear and anicteric       Lungs:     Clear to auscultation bilaterally, respirations unlabored  Heart:    Regular rate and rhythm, S1 and S2 normal, no murmur, rub   or gallop  Abdomen:     Soft, non-tender, normoactive bowel sounds  Extremities:   Extremities normal, atraumatic, no cyanosis or edema  Neurologic:   Normal strength, full sensation to light touch in bilateral hands, feet.   Lab Results: CBG:  Recent Labs Lab 11/20/14 2034 11/21/14 0805 11/21/14 1138 11/21/14 1636 11/21/14 2007 11/22/14 0730  GLUCAP 115* 122* 143* 105* 112* 108*   Transthoracic Ecocardiogram: - LV: mild LVH, EF 60%. No dilation or wall motion abnormalities - MV: mild MR - LA: mild dilation - RV: no dilation, normal systolic function  Medications: I have reviewed the  patient's current medications. Scheduled Meds: . aspirin EC  81 mg Oral Daily  . cephALEXin  500 mg Oral QID  . citalopram  20 mg Oral Daily  . Darunavir Ethanolate  800 mg Oral Q breakfast  . elvitegravir-cobicistat-emtricitabine-tenofovir  1 tablet Oral Q breakfast  . enoxaparin (LOVENOX) injection  40 mg Subcutaneous Q24H  . mometasone-formoterol  2 puff Inhalation BID  . pravastatin  40 mg Oral q1800  . QUEtiapine  200 mg Oral QHS  . terbinafine  250 mg Oral Daily  . tiotropium  18 mcg Inhalation Daily  . valACYclovir  500 mg Oral Daily   Continuous Infusions:  PRN Meds:.acetaminophen, albuterol, hydroxypropyl methylcellulose / hypromellose Assessment/Plan: Principal Problem:   Syncope Active Problems:   Human immunodeficiency virus (HIV) disease   Polysubstance abuse   Essential hypertension   COPD, moderate   Diabetes mellitus, type 2   Faintness   Embolic cerebral infarction  Stroke: MRI findings demonstrate multiple new small infarcts, unclear etiology. Previous h/o CVA in 2013, pt completed a 3 month course of Plavix, no ASA after. - Neurology consulted, appreciate recs - MRI brain: patchy acute ischemic infarcts in R frontal parietal lobes and R occipital lobe,  R caudate head. - MRA head: chronic L ICA occlusion in L MCA stable from MRA in 2013. - EEG 11/20/2014 normal - Carotid dopplers negative for occlusion - TTE negative for thrombus or wall motion abnormalities - Holter monitor to be placed as outpatient - EKG 5/2 and 5/3 AM: lateral lead TWI. Troponin negative x4. - D/C with ASA 81 mg daily, pravastatin 40 mg daily - Telemetry - PT and OT have seen pt, recommend no PT/OT needed.  HIV: Home meds Genvoya and [place]. Last viral load undetectable, last CD4 400 in April 2016. - Continue home meds  COPD: Home meds: albuterol inhaler, Dulera, Spiriva. Stable. - Albuterol inhaler prn - Continue home Helyn Numbers  Hypertension: Home meds metoprolol 25 mg  BID, lisinopril 20 mg daily - Last BP 135/72, max SBP since admission 146 - antihypertensives held for permissive HTN in the setting of acute infarct  Sialadenitis: pt presented to ED on 11/15/2014, started on 10 days cephalexin - continue 10 day course of cephalexin (D1 = 4/28/12016)  Type II Diabetes: BG < 150 since admission. Home meds: glipizide 5 mg daily. Last A1c 6.6 in 10/2014. - Consider SSI for hyperglycemia. Continue POC blood glucose monitoring.  Depression: Home meds: Seroquel 150 mg qHS, citalopram. - Continue home Seroquel at 20 mg - Celexa 20 mg daily  Onychomycosis: Started Terbinafine on 10/31/2014 for fingernail, toenail infection. - Continue Terbinafine  Genital Herpes: Stable. Home meds Valtrex. - Continue Valtrex  Polysubstance Abuse: History of cocaine, crack cocaine, marijuana, EtOH.  - UDS negative on admission  Hepatitis C: Completed Harvoni treatment 10/20/2014  PPX: enoxaparin 40 mg daily for DVT PPX  Dispo: Anticipated discharge today.   The patient does have a current PCP Jessee Avers, MD) and does need an [facility] hospital follow-up appointment after discharge.  The patient does not have transportation limitations that hinder transportation to clinic appointments.  .Services Needed at time of discharge: Y = Yes, Blank = No PT:   OT:   RN:   Equipment:   Other:     LOS: 3 days   Susa Day, Med Student 11/22/2014, 10:46 AM

## 2014-11-22 NOTE — Discharge Instructions (Signed)
1. You have a follow up appointment as follows:  Norman Herrlich, MD  On 12/06/2014 @ 10:45 AM  [place] [id number] [phone]  2. Please take all medications as previously prescribed with the following changes:  Start taking Aspirin 81 mg daily  Please also take Pravastatin 40 mg daily for cholesterol  3. If you have worsening of your symptoms or new symptoms arise, please call the clinic [phone]), or go to the ER immediately if symptoms are severe.   Syncope Syncope means a person passes out (faints). The person usually wakes up in less than 5 minutes. It is important to seek medical care for syncope. HOME CARE  Have someone stay with you until you feel normal.  Do not drive, use machines, or play sports until your doctor says it is okay.  Keep all doctor visits as told.  Lie down when you feel like you might pass out. Take deep breaths. Wait until you feel normal before standing up.  Drink enough fluids to keep your pee (urine) clear or pale yellow.  If you take blood pressure or heart medicine, get up slowly. Take several minutes to sit and then stand. GET HELP RIGHT AWAY IF:   You have a severe headache.  You have pain in the chest, belly (abdomen), or back.  You are bleeding from the mouth or butt (rectum).  You have black or tarry poop (stool).  You have an irregular or very fast heartbeat.  You have pain with breathing.  You keep passing out, or you have shaking (seizures) when you pass out.  You pass out when sitting or lying down.  You feel confused.  You have trouble walking.  You have severe weakness.  You have vision problems. If you fainted, call your local emergency services (911 in [place] Do not drive yourself to the hospital. MAKE SURE YOU:   Understand these instructions.  Will watch your condition.  Will get help right away if you are not doing well or get worse. Document Released: 12/23/2007 Document Revised:  01/05/2012 Document Reviewed: 09/04/2011 [facility] Patient Information 2015 [place], [place]. This information is not intended to replace advice given to you by your health care provider. Make sure you discuss any questions you have with your health care provider.  Dizziness Dizziness is a common problem. It is a feeling of unsteadiness or light-headedness. You may feel like you are about to faint. Dizziness can lead to injury if you stumble or fall. A person of any age group can suffer from dizziness, but dizziness is more common in older adults. CAUSES  Dizziness can be caused by many different things, including:  Middle ear problems.  Standing for too long.  Infections.  An allergic reaction.  Aging.  An emotional response to something, such as the sight of blood.  Side effects of medicines.  Tiredness.  Problems with circulation or blood pressure.  Excessive use of alcohol or medicines, or illegal drug use.  Breathing too fast (hyperventilation).  An irregular heart rhythm (arrhythmia).  A low red blood cell count (anemia).  Pregnancy.  Vomiting, diarrhea, fever, or other illnesses that cause body fluid loss (dehydration).  Diseases or conditions such as Parkinson's disease, high blood pressure (hypertension), diabetes, and thyroid problems.  Exposure to extreme heat. DIAGNOSIS  Your health care provider will ask about your symptoms, perform a physical exam, and perform an electrocardiogram (ECG) to record the electrical activity of your heart. Your health care provider may  also perform other heart or blood tests to determine the cause of your dizziness. These may include:  Transthoracic echocardiogram (TTE). During echocardiography, sound waves are used to evaluate how blood flows through your heart.  Transesophageal echocardiogram (TEE).  Cardiac monitoring. This allows your health care provider to monitor your heart rate and rhythm in real time.  Holter monitor.  This is a portable device that records your heartbeat and can help diagnose heart arrhythmias. It allows your health care provider to track your heart activity for several days if needed.  Stress tests by exercise or by giving medicine that makes the heart beat faster. TREATMENT  Treatment of dizziness depends on the cause of your symptoms and can vary greatly. HOME CARE INSTRUCTIONS   Drink enough fluids to keep your urine clear or pale yellow. This is especially important in very hot weather. In older adults, it is also important in cold weather.  Take your medicine exactly as directed if your dizziness is caused by medicines. When taking blood pressure medicines, it is especially important to get up slowly.  Rise slowly from chairs and steady yourself until you feel okay.  In the morning, first sit up on the side of the bed. When you feel okay, stand slowly while holding onto something until you know your balance is fine.  Move your legs often if you need to stand in one place for a long time. Tighten and relax your muscles in your legs while standing.  Have someone stay with you for 1-2 days if dizziness continues to be a problem. Do this until you feel you are well enough to stay alone. Have the person call your health care provider if he or she notices changes in you that are concerning.  Do not drive or use heavy machinery if you feel dizzy.  Do not drink alcohol. SEEK IMMEDIATE MEDICAL CARE IF:   Your dizziness or light-headedness gets worse.  You feel nauseous or vomit.  You have problems talking, walking, or using your arms, hands, or legs.  You feel weak.  You are not thinking clearly or you have trouble forming sentences. It may take a friend or family member to notice this.  You have chest pain, abdominal pain, shortness of breath, or sweating.  Your vision changes.  You notice any bleeding.  You have side effects from medicine that seems to be getting worse  rather than better. MAKE SURE YOU:   Understand these instructions.  Will watch your condition.  Will get help right away if you are not doing well or get worse. Document Released: 12/30/2000 Document Revised: 07/11/2013 Document Reviewed: 01/23/2011 [facility] Patient Information 2015 [place], [place]. This information is not intended to replace advice given to you by your health care provider. Make sure you discuss any questions you have with your health care provider.  Ischemic Stroke A stroke (cerebrovascular accident) is the sudden death of brain tissue. It is a medical emergency. A stroke can cause permanent loss of brain function. This can cause problems with different parts of your body. A transient ischemic attack (TIA) is different because it does not cause permanent damage. A TIA is a short-lived problem of poor blood flow affecting a part of the brain. A TIA is also a serious problem because having a TIA greatly increases the chances of having a stroke. When symptoms first develop, you cannot know if the problem might be a stroke or a TIA. CAUSES  A stroke is caused by a decrease  of oxygen supply to an area of your brain. It is usually the result of a small blood clot or collection of cholesterol or fat (plaque) that blocks blood flow in the brain. A stroke can also be caused by blocked or damaged carotid arteries.  RISK FACTORS  High blood pressure (hypertension).  High cholesterol.  Diabetes mellitus.  Heart disease.  The buildup of plaque in the blood vessels (peripheral artery disease or atherosclerosis).  The buildup of plaque in the blood vessels providing blood and oxygen to the brain (carotid artery stenosis).  An abnormal heart rhythm (atrial fibrillation).  Obesity.  Smoking.  Taking oral contraceptives (especially in combination with smoking).  Physical inactivity.  A diet high in fats, salt (sodium), and calories.  Alcohol use.  Use of illegal drugs  (especially cocaine and methamphetamine).  Being African American.  Being over the age of 90.  Family history of stroke.  Previous history of blood clots, stroke, TIA, or heart attack.  Sickle cell disease. SYMPTOMS  These symptoms usually develop suddenly, or may be newly present upon awakening from sleep:  Sudden weakness or numbness of the face, arm, or leg, especially on one side of the body.  Sudden trouble walking or difficulty moving arms or legs.  Sudden confusion.  Sudden personality changes.  Trouble speaking (aphasia) or understanding.  Difficulty swallowing.  Sudden trouble seeing in one or both eyes.  Double vision.  Dizziness.  Loss of balance or coordination.  Sudden severe headache with no known cause.  Trouble reading or writing. DIAGNOSIS  Your health care provider can often determine the presence or absence of a stroke based on your symptoms, history, and physical exam. Computed tomography (CT) of the brain is usually performed to confirm the stroke, determine causes, and determine stroke severity. Other tests may be done to find the cause of the stroke. These tests may include:  Electrocardiography.  Continuous heart monitoring.  Echocardiography.  Carotid ultrasonography.  Magnetic resonance imaging (MRI).  A scan of the brain circulation.  Blood tests. PREVENTION  The risk of a stroke can be decreased by appropriately treating high blood pressure, high cholesterol, diabetes, heart disease, and obesity and by quitting smoking, limiting alcohol, and staying physically active. TREATMENT  Time is of the essence. It is important to seek treatment at the first sign of these symptoms because you may receive a medicine to dissolve the clot (thrombolytic) that cannot be given if too much time has passed since your symptoms began. Even if you do not know when your symptoms began, get treatment as soon as possible as there are other treatment options  available including oxygen, intravenous (IV) fluids, and medicines to thin the blood (anticoagulants). Treatment of stroke depends on the duration, severity, and cause of your symptoms. Medicines and dietary changes may be used to address diabetes, high blood pressure, and other risk factors. Physical, speech, and occupational therapists will assess you and work with you to improve any functions impaired by the stroke. Measures will be taken to prevent short-term and long-term complications, including infection from breathing foreign material into the lungs (aspiration pneumonia), blood clots in the legs, bedsores, and falls. Rarely, surgery may be needed to remove large blood clots or to open up blocked arteries. HOME CARE INSTRUCTIONS   Take medicines only as directed by your health care provider. Follow the directions carefully. Medicines may be used to control risk factors for a stroke. Be sure you understand all your medicine instructions.  You may  be told to take a medicine to thin the blood, such as aspirin or the anticoagulant warfarin. Warfarin needs to be taken exactly as instructed.  Too much and too little warfarin are both dangerous. Too much warfarin increases the risk of bleeding. Too little warfarin continues to allow the risk for blood clots. While taking warfarin, you will need to have regular blood tests to measure your blood clotting time. These blood tests usually include both the PT and INR tests. The PT and INR results allow your health care provider to adjust your dose of warfarin. The dose can change for many reasons. It is critically important that you take warfarin exactly as prescribed, and that you have your PT and INR levels drawn exactly as directed.  Many foods, especially foods high in vitamin K, can interfere with warfarin and affect the PT and INR results. Foods high in vitamin K include spinach, kale, broccoli, cabbage, collard and turnip greens, brussels sprouts, peas,  cauliflower, seaweed, and parsley, as well as beef and pork liver, green tea, and soybean oil. You should eat a consistent amount of foods high in vitamin K. Avoid major changes in your diet, or notify your health care provider before changing your diet. Arrange a visit with a dietitian to answer your questions.  Many medicines can interfere with warfarin and affect the PT and INR results. You must tell your health care provider about any and all medicines you take. This includes all vitamins and supplements. Be especially cautious with aspirin and anti-inflammatory medicines. Do not take or discontinue any prescribed or over-the-counter medicine except on the advice of your health care provider or pharmacist.  Warfarin can have side effects, such as excessive bruising or bleeding. You will need to hold pressure over cuts for longer than usual. Your health care provider or pharmacist will discuss other potential side effects.  Avoid sports or activities that may cause injury or bleeding.  Be mindful when shaving, flossing your teeth, or handling sharp objects.  Alcohol can change the body's ability to handle warfarin. It is best to avoid alcoholic drinks or consume only very small amounts while taking warfarin. Notify your health care provider if you change your alcohol intake.  Notify your dentist or other health care providers before procedures.  If swallow studies have determined that your swallowing reflex is present, you should eat healthy foods. Including 5 or more servings of fruits and vegetables a day may reduce the risk of stroke. Foods may need to be a certain consistency (soft or pureed), or small bites may need to be taken in order to avoid aspirating or choking. Certain dietary changes may be advised to address high blood pressure, high cholesterol, diabetes, or obesity.  Food choices that are low in sodium, saturated fat, trans fat, and cholesterol are recommended to manage high blood  pressure.  Food choies that are high in fiber, and low in saturated fat, trans fat, and cholesterol may control cholesterol levels.  Controlling carbohydrates and sugar intake is recommended to manage diabetes.  Reducing calorie intake and making food choices that are low in sodium, saturated fat, trans fat, and cholesterol are recommended to manage obesity.  Maintain a healthy weight.  Stay physically active. It is recommended that you get at least 30 minutes of activity on all or most days.  Do not use any tobacco products including cigarettes, chewing tobacco, or electronic cigarettes.  Limit alcohol use even if you are not taking warfarin. Moderate alcohol use is  considered to be:  No more than 2 drinks each day for men.  No more than 1 drink each day for nonpregnant women.  Home safety. A safe home environment is important to reduce the risk of falls. Your health care provider may arrange for specialists to evaluate your home. Having grab bars in the bedroom and bathroom is often important. Your health care provider may arrange for equipment to be used at home, such as raised toilets and a seat for the shower.  Physical, occupational, and speech therapy. Ongoing therapy may be needed to maximize your recovery after a stroke. If you have been advised to use a walker or a cane, use it at all times. Be sure to keep your therapy appointments.  Follow all instructions for follow-up with your health care provider. This is very important. This includes any referrals, physical therapy, rehabilitation, and lab tests. Proper follow-up can prevent another stroke from occurring. SEEK MEDICAL CARE IF:  You have personality changes.  You have difficulty swallowing.  You are seeing double.  You have dizziness.  You have a fever.  You have skin breakdown. SEEK IMMEDIATE MEDICAL CARE IF:  Any of these symptoms may represent a serious problem that is an emergency. Do not wait to see if the  symptoms will go away. Get medical help right away. Call your local emergency services (911 in [place] Do not drive yourself to the hospital.  You have sudden weakness or numbness of the face, arm, or leg, especially on one side of the body.  You have sudden trouble walking or difficulty moving arms or legs.  You have sudden confusion.  You have trouble speaking (aphasia) or understanding.  You have sudden trouble seeing in one or both eyes.  You have a loss of balance or coordination.  You have a sudden, severe headache with no known cause.  You have new chest pain or an irregular heartbeat.  You have a partial or total loss of consciousness. Document Released: 07/06/2005 Document Revised: 11/20/2013 Document Reviewed: 02/14/2012 [facility] Patient Information 2015 [place], [place]. This information is not intended to replace advice given to you by your health care provider. Make sure you discuss any questions you have with your health care provider.

## 2014-11-22 NOTE — Progress Notes (Signed)
Patient has met adequate criteria for discharge per MD order. Patient discharge summary was printed and given to patient. All required education, follow up appointments, and current/new medications were gone over with patient. All hospital equipment and invasive lines were removed. Patient left the hospital via wheelchair escorted by hospital transportation via wheelchair with spouse and patient belongings in hand.

## 2014-11-22 NOTE — Progress Notes (Signed)
  Date: 11/22/2014  Patient name: Jacob Joseph  Medical record number: [id number]  Date of birth: [birth year]   This patient's plan of care was discussed with the house staff. Please see Dr. Claris Gladden note for complete details. I concur with her findings.  Jacob Joseph is ready for discharge today and appropriate follow up has been arranged by the housestaff team.     Sid Falcon, MD 11/22/2014, 3:29 PM

## 2014-11-22 NOTE — Care Management Note (Signed)
11/22/14 Elenor Quinones RN BSN [phone]    Medicare Important Message given?  Yes (If response is "NO", the following Medicare IM given date fields will be blank) Date Medicare IM given:   11/22/14 Medicare IM given by:  Elenor Quinones

## 2014-11-22 NOTE — Progress Notes (Signed)
Subjective: Pt doing well, denies dizziness or weakness. He is ready to go home.    RN: no issues   Objective: Vital signs in last 24 hours: Filed Vitals:   11/21/14 2038 11/21/14 2353 11/22/14 0536 11/22/14 0700  BP:  134/64 135/72   Pulse:  74 72   Temp:  98 F (36.7 C) 98 F (36.7 C)   TempSrc:  Oral Oral   Resp:      Height:      Weight:   206 lb 14.4 oz (93.849 kg)   SpO2: 96% 97% 100% 98%   Weight change: 1 lb 4.9 oz (0.593 kg)  Intake/Output Summary (Last 24 hours) at 11/22/14 1303 Last data filed at 11/22/14 0811  Gross per 24 hour  Intake    950 ml  Output    375 ml  Net    575 ml   Vitals reviewed. General: resting in bed, NAD HEENT: Desha/at, no scleral icterus Cardiac: RRR, no rubs, murmurs or gallops Pulm: clear to auscultation bilaterally, no wheezes, rales, or rhonchi Abd: soft, nontender, nondistended, BS present Ext: warm and well perfused, no pedal edema Neuro: alert and oriented X3, cranial nerves II-XII grossly intact, strength 5/5 all extremities, no sensation deficits  Lab Results: Basic Metabolic Panel:  Recent Labs Lab 11/20/14 0311 11/21/14 0420  NA 137 137  K 3.9 4.0  CL 106 106  CO2 20* 23  GLUCOSE 105* 130*  BUN 17 13  CREATININE 1.09 0.99  CALCIUM 8.8* 9.0   Liver Function Tests:  Recent Labs Lab 11/19/14 1607  AST 23  ALT 15*  ALKPHOS 87  BILITOT 0.7  PROT 8.3*  ALBUMIN 3.4*   CBC:  Recent Labs Lab 11/19/14 1607  WBC 7.6  HGB 13.8  HCT 41.8  MCV 86.7  PLT 193   Cardiac Enzymes:  Recent Labs Lab 11/19/14 2130 11/20/14 0311 11/20/14 0927  TROPONINI <0.03 <0.03 <0.03   CBG:  Recent Labs Lab 11/21/14 0805 11/21/14 1138 11/21/14 1636 11/21/14 2007 11/22/14 0730 11/22/14 1142  GLUCAP 122* 143* 105* 112* 108* 125*   Hemoglobin A1C: 6.6 on 10/31/14  Fasting Lipid Panel:  Recent Labs Lab 11/20/14 1432  CHOL 169  HDL 29*  LDLCALC 112*  TRIG 142  CHOLHDL 5.8   Urine Drug Screen: Drugs  of Abuse     Component Value Date/Time   LABOPIA NONE DETECTED 11/19/2014 1650   LABOPIA NEG 05/31/2013 1625   COCAINSCRNUR NONE DETECTED 11/19/2014 1650   COCAINSCRNUR NEG 05/31/2013 1625   LABBENZ NONE DETECTED 11/19/2014 1650   LABBENZ NEG 05/31/2013 1625   LABBENZ NEGATIVE 03/09/2012 0741   AMPHETMU NONE DETECTED 11/19/2014 1650   AMPHETMU NEG 05/31/2013 1625   AMPHETMU NEGATIVE 03/09/2012 0741   THCU NONE DETECTED 11/19/2014 1650   THCU NEG 05/31/2013 1625   LABBARB NONE DETECTED 11/19/2014 1650   LABBARB NEG 05/31/2013 1625    Urinalysis:  Recent Labs Lab 11/19/14 1650  COLORURINE YELLOW  LABSPEC 1.023  PHURINE 5.0  GLUCOSEU >1000*  HGBUR NEGATIVE  BILIRUBINUR NEGATIVE  KETONESUR 15*  PROTEINUR NEGATIVE  UROBILINOGEN 1.0  NITRITE NEGATIVE  LEUKOCYTESUR NEGATIVE   Misc. Labs: none  Micro Results: No results found for this or any previous visit (from the past 240 hour(s)). Studies/Results: No results found. Medications:  Scheduled Meds: . aspirin EC  81 mg Oral Daily  . cephALEXin  500 mg Oral QID  . citalopram  20 mg Oral Daily  . Darunavir Ethanolate  800  mg Oral Q breakfast  . elvitegravir-cobicistat-emtricitabine-tenofovir  1 tablet Oral Q breakfast  . enoxaparin (LOVENOX) injection  40 mg Subcutaneous Q24H  . mometasone-formoterol  2 puff Inhalation BID  . pravastatin  40 mg Oral q1800  . QUEtiapine  200 mg Oral QHS  . terbinafine  250 mg Oral Daily  . tiotropium  18 mcg Inhalation Daily  . valACYclovir  500 mg Oral Daily   Continuous Infusions:  PRN Meds:.acetaminophen, albuterol, hydroxypropyl methylcellulose / hypromellose Assessment/Plan: 59 y.o pmh HTN, HIV, depression, h/o stroke, latent TB, hep C on Harvoni.  He presented for stroke   #Stroke -CT 11/19/14 Atrophy and chronic ischemic white matter disease without acute intracranial abnormality. MRI 5/3 with patchy acute ischemic infarcts involving the cortical gray matter of the right  frontal parietal lobes as well as the right occipital lobe, with additional infarct involving the right caudate head. These infarcts are in a right MCA territory distribution. No associated hemorrhage or significant mass effect. Remote bilateral frontal lobe infarcts. mild atrophy with chronic small vessel ischemic disease. 5/3 MRA HEAD IMPRESSION: Chronic occlusion of the supraclinoid left ICA with absent flow within the left middle cerebral artery and its branches distally. This is stable relative to previous MRA from 2013. No new proximal branch occlusion or hemodynamically significant stenosis identified. Anterior cerebral arteries supplied via a widely patent right A1 segment. Fetal origin of the left PCA, supplied via the left internal carotid artery just prior to its occlusion at its supraclinoid segment. 5/3 [place] 1-39% ICA stenosis b/l.  TTE 5/4 60%, mild LVH, mild MR, LA mild dilated  -f/u with Neuro in 2-3 months. Ensure pt has f/u appt sch at hospital f/u appt  -f/u with [facility] 5/19 10:45 am Dr. Hulen Luster  -will need Holter monitor. Assess this has been set up outpatient at [facility]. F/u  -continue Aspirin 81, Pravastatin 40, risk factor control (i.e A1C 6.6 10/2014 will need to be on oral glycemic agent)  #Syncope, resolved -will need Holter monitor at home -Ensure has been given Holter monitor at home   #Human immunodeficiency virus (HIV) disease/HCV -follows with ID outpatient with upcoming appt. Continue Prezista, Cory Munch  #Polysubstance/tobacco abuse -Counseled on smoking cessation. UDS neg this admission   #Essential hypertension -resumed home BP meds (Lisinopril 20 mg qd, Lopressor 25 mg bid)  #COPD, moderate -stable. Resume prn Albuterol neb/inhaler, Advair, Dulera, Spiriva   #Diabetes mellitus, type 2 -Resume Glipizide 5 mg qam.  Pt states not taking.  -Assess compliance outpatient appt.   #Depression/Anxiety/Insomia  -Continue Celexa 20 mg, Vistaril 50 mg qd, Seroquel  300 XL qhs, resume Ambien  #Onychomycosis -f/u sch outpatient. Continue antifungal cream, Lamisil   #Herpes  -resume Valtrex   #DVT px  -Lovenox   Dispo: D/c today   The patient does have a current PCP Jessee Avers, MD) and does need an [facility] hospital follow-up appointment after discharge.  The patient does not have transportation limitations that hinder transportation to clinic appointments.  .Services Needed at time of discharge: Y = Yes, Blank = No PT: none  OT: none  RN:   Equipment: none  Other:     LOS: 3 days   Cresenciano Genre, MD 11/22/2014, 1:03 PM

## 2014-11-22 NOTE — Discharge Summary (Signed)
Name: Jacob Joseph MRN: [id number] DOB: 20-Apr-1956 59 y.o. PCP: Jessee Avers, MD  Date of Admission: 11/19/2014  3:28 PM Date of Discharge: 11/22/2014 Attending Physician: Dr. Daryll Drown  Discharge Diagnosis: #Stroke #Syncope, resolved #Human immunodeficiency virus (HIV) disease controlled/HCV #Polysubstance/tobacco abuse #Essential hypertension #COPD, moderate (stable) #Diabetes mellitus, type 2 #Depression/Anxiety/Insomia  #Onychomycosis #recent Sialadenitis  #Herpes   Discharge Medications:   Medication List    TAKE these medications        ADVAIR DISKUS 100-50 MCG/DOSE Aepb  Generic drug:  Fluticasone-Salmeterol  Inhale 1 puff into the lungs 2 (two) times daily.     albuterol 108 (90 BASE) MCG/ACT inhaler  Commonly known as:  PROVENTIL HFA;VENTOLIN HFA  Inhale 2 puffs into the lungs every 6 (six) hours as needed for wheezing or shortness of breath.     aspirin 81 MG EC tablet  Take 1 tablet (81 mg total) by mouth daily.     cephALEXin 500 MG capsule  Commonly known as:  KEFLEX  Take 1 capsule (500 mg total) by mouth 4 (four) times daily.     citalopram 20 MG tablet  Commonly known as:  CELEXA  Take 20 mg by mouth daily.     Darunavir Ethanolate 800 MG tablet  Commonly known as:  PREZISTA  Take 1 tablet (800 mg total) by mouth daily with breakfast.     PREZISTA 800 MG tablet  Generic drug:  Darunavir Ethanolate  TAKE 1 TABLET BY MOUTH EVERY DAY WITH BREAKFAST     elvitegravir-cobicistat-emtricitabine-tenofovir 150-150-200-10 MG Tabs tablet  Commonly known as:  GENVOYA  Take 1 tablet by mouth daily with breakfast.     glipiZIDE 5 MG tablet  Commonly known as:  GLUCOTROL  Take 1 tablet (5 mg total) by mouth daily before breakfast.     hydroxypropyl methylcellulose / hypromellose 2.5 % ophthalmic solution  Commonly known as:  ISOPTO TEARS / GONIOVISC  Place 1 drop into both eyes as needed for dry eyes.     hydrOXYzine 50 MG capsule  Commonly known  as:  VISTARIL  Take 50 mg by mouth daily.     Ledipasvir-Sofosbuvir 90-400 MG Tabs  Commonly known as:  HARVONI  Take 1 tablet by mouth daily with breakfast.     lisinopril 20 MG tablet  Commonly known as:  PRINIVIL,ZESTRIL  Take 1 tablet (20 mg total) by mouth daily.     metoprolol tartrate 25 MG tablet  Commonly known as:  LOPRESSOR  Take 1 tablet (25 mg total) by mouth 2 (two) times daily.     miconazole 2 % cream  Commonly known as:  MICATIN  Apply 1 application topically 2 (two) times daily. Apply to affected areas     mometasone-formoterol 100-5 MCG/ACT Aero  Commonly known as:  DULERA  Inhale 2 puffs into the lungs 2 (two) times daily.     pravastatin 40 MG tablet  Commonly known as:  PRAVACHOL  Take 1 tablet (40 mg total) by mouth daily.     SEROQUEL XR 300 MG 24 hr tablet  Generic drug:  QUEtiapine  Take 300 mg by mouth at bedtime.     SPIRIVA HANDIHALER 18 MCG inhalation capsule  Generic drug:  tiotropium  INHALE THE CONTENTS OF 1 CASPSULE USING HANDIHALER EVERY DAY     terbinafine 250 MG tablet  Commonly known as:  LAMISIL  Take 1 tablet (250 mg total) by mouth daily.     valACYclovir 500 MG tablet  Commonly known as:  VALTREX  Take 1 tablet (500 mg total) by mouth 2 (two) times daily.     zolpidem 10 MG tablet  Commonly known as:  AMBIEN  Take 10 mg by mouth at bedtime as needed. For sleep        Disposition and follow-up:   Jacob Joseph was discharged from [facility] in stable condition.  At the hospital follow up visit please address:  1.   -Ensure compliance with meds (Aspirin, Pravastatin, Glipizide) -Ensure gets Holter monitor  -Ensure follow up with Neurology  2.  Labs / imaging needed at time of follow-up: none  3.  Pending labs/ test needing follow-up: none  Follow-up Appointments: Follow-up Information    Follow up with Julious Oka, MD On 12/06/2014.   Specialty:  Internal Medicine   Why:  10:45 AM    Contact information:   [place] [address] [phone]       Follow up with SETHI,PRAMOD, MD.   Specialties:  Neurology, Radiology   Why:  follow up in 2-3 months with Neurology    Contact information:   [place] [address] [phone]       Discharge Instructions: Discharge Instructions    Diet - low sodium heart healthy    Complete by:  As directed      Discharge instructions    Complete by:  As directed   Follow up 12/06/14 at 10:45 am with Internal Medicine [facility] [phone] Please follow up with [facility] Neurology in 2-3 months  Take care  No driving until further instructed  You will need a Holter monitor     Increase activity slowly    Complete by:  As directed            Consultations: Neuro Procedures Performed:  Dg Chest 2 View  11/19/2014   CLINICAL DATA:  Syncope, headache  EXAM: CHEST  2 VIEW  COMPARISON:  Five/ 2/15  FINDINGS: Cardiomediastinal silhouette is stable. No acute infiltrate or pleural effusion. No pulmonary edema. Stable bilateral basilar atelectasis or scarring.  IMPRESSION: No active cardiopulmonary disease.   Electronically Signed   By: Lahoma Crocker M.D.   On: 11/19/2014 17:08   Ct Head Wo Contrast  11/19/2014   CLINICAL DATA:  Altered mental status.  Fall.  EXAM: CT HEAD WITHOUT CONTRAST  TECHNIQUE: Contiguous axial images were obtained from the base of the skull through the vertex without intravenous contrast.  COMPARISON:  MRI 03/09/2012.  FINDINGS: No mass lesion, mass effect, midline shift, hydrocephalus, hemorrhage. No acute territorial cortical ischemia/infarct. Atrophy and chronic ischemic white matter disease is present. Carotid atherosclerosis is present. Mild paranasal sinus disease.  IMPRESSION: Atrophy and chronic ischemic white matter disease without acute intracranial abnormality.   Electronically Signed   By: Dereck Ligas M.D.   On: 11/19/2014 18:22   Mr Jodene Nam Head Wo Contrast  11/20/2014    CLINICAL DATA:  Initial evaluation for acute syncope, aphasia.  EXAM: MRI HEAD WITHOUT CONTRAST  MRA HEAD WITHOUT CONTRAST  TECHNIQUE: Multiplanar, multiecho pulse sequences of the brain and surrounding structures were obtained without intravenous contrast. Angiographic images of the head were obtained using MRA technique without contrast.  COMPARISON:  Prior CT from 11/19/2014 as well as previous MRI from 03/09/2012.  FINDINGS: MRI HEAD FINDINGS  Diffuse prominence of the CSF containing spaces is compatible with generalized cerebral atrophy. Patchy and confluent T2/FLAIR hyperintensity within the periventricular and deep white matter both cerebral hemispheres most  consistent with chronic small vessel ischemic disease, mild to moderate nature.  Remote lacunar infarct present within the high and posterior left frontal lobe (series 8, image 23). Additional remote infarcts present within the periventricular white matter of the left corona radiata.  There are scattered foci of patchy restricted diffusion involving the cortical gray matter of the right parietal lobe (series 3, image 40, 36). Additional small cortical infarct present more anteriorly within the right frontal lobe (series 3, image 38, 34) there is patchy ischemic infarct involving the right caudate head (series 3, image 26). Additional infarct involving the right occipital lobe (series 3, image 19). Additional tiny possible infarct within the right middle cerebral peduncle (series 3, image 18). These are all likely within the right MCA territory distribution. No associated hemorrhage or significant mass effect.  No mass lesion or midline shift. No hydrocephalus. No extra-axial fluid collection.  Craniocervical junction within normal limits. Pituitary gland normal. No acute abnormality about the orbits.  Mild opacity seen layering within the sphenoid sinuses bilaterally. Paranasal sinuses are otherwise clear. No mastoid effusion. Inner ear structures normal.   Bone marrow signal intensity within normal limits. Mild degenerative changes noted within the visualized upper cervical spine. Scalp soft tissues within normal limits.  MRA HEAD FINDINGS  ANTERIOR CIRCULATION:  Visualized distal cervical segments of the internal carotid arteries are widely patent with antegrade flow. The left ICA is somewhat diminutive as compared to the right. Petrous segments and cavernous segments are patent bilaterally. Again, there is occlusion of the supraclinoid left ICA, similar to prior. Supraclinoid right ICA is widely patent. There is a widely patent right A1 segment supplying both anterior cerebral arteries which are well opacified. Right M1 segment widely patent without stenosis or occlusion. MCA bifurcation on the right is normal. Distal MCA branches on the right are well opacified.  Left MCA artery and its branches are not visualize, stable from prior.  POSTERIOR CIRCULATION:  Both vertebral arteries are widely patent to the vertebrobasilar junction. Posterior inferior cerebral arteries not well evaluated on this exam. Basilar artery widely patent. Right anterior inferior cerebellar artery is dominant. Superior cerebellar arteries patent bilaterally. Right posterior cerebral artery widely patent. There is fetal origin of the left PCA with widely patent left posterior communicating artery. This is supplied via the left ICA just prior to its occlusion at its supraclinoid segment. Left PCA is widely patent.  No aneurysm.  IMPRESSION: MRI HEAD IMPRESSION:  1. Patchy acute ischemic infarcts involving the cortical gray matter of the right frontal parietal lobes as well as the right occipital lobe, with additional infarct involving the right caudate head. These infarcts are in a right MCA territory distribution. No associated hemorrhage or significant mass effect. 2. Remote bilateral frontal lobe infarcts as detailed above. 3. Mild atrophy with chronic small vessel ischemic disease.  MRA  HEAD IMPRESSION:  1. Chronic occlusion of the supraclinoid left ICA with absent flow within the left middle cerebral artery and its branches distally. This is stable relative to previous MRA from 2013. 2. No new proximal branch occlusion or hemodynamically significant stenosis identified. 3. Anterior cerebral arteries supplied via a widely patent right A1 segment. 4. Fetal origin of the left PCA, supplied via the left internal carotid artery just prior to its occlusion at its supraclinoid segment.   Electronically Signed   By: Jeannine Boga M.D.   On: 11/20/2014 02:20   Mr Brain Wo Contrast  11/20/2014   CLINICAL DATA:  Initial evaluation for acute  syncope, aphasia.  EXAM: MRI HEAD WITHOUT CONTRAST  MRA HEAD WITHOUT CONTRAST  TECHNIQUE: Multiplanar, multiecho pulse sequences of the brain and surrounding structures were obtained without intravenous contrast. Angiographic images of the head were obtained using MRA technique without contrast.  COMPARISON:  Prior CT from 11/19/2014 as well as previous MRI from 03/09/2012.  FINDINGS: MRI HEAD FINDINGS  Diffuse prominence of the CSF containing spaces is compatible with generalized cerebral atrophy. Patchy and confluent T2/FLAIR hyperintensity within the periventricular and deep white matter both cerebral hemispheres most consistent with chronic small vessel ischemic disease, mild to moderate nature.  Remote lacunar infarct present within the high and posterior left frontal lobe (series 8, image 23). Additional remote infarcts present within the periventricular white matter of the left corona radiata.  There are scattered foci of patchy restricted diffusion involving the cortical gray matter of the right parietal lobe (series 3, image 40, 36). Additional small cortical infarct present more anteriorly within the right frontal lobe (series 3, image 38, 34) there is patchy ischemic infarct involving the right caudate head (series 3, image 26). Additional infarct  involving the right occipital lobe (series 3, image 19). Additional tiny possible infarct within the right middle cerebral peduncle (series 3, image 18). These are all likely within the right MCA territory distribution. No associated hemorrhage or significant mass effect.  No mass lesion or midline shift. No hydrocephalus. No extra-axial fluid collection.  Craniocervical junction within normal limits. Pituitary gland normal. No acute abnormality about the orbits.  Mild opacity seen layering within the sphenoid sinuses bilaterally. Paranasal sinuses are otherwise clear. No mastoid effusion. Inner ear structures normal.  Bone marrow signal intensity within normal limits. Mild degenerative changes noted within the visualized upper cervical spine. Scalp soft tissues within normal limits.  MRA HEAD FINDINGS  ANTERIOR CIRCULATION:  Visualized distal cervical segments of the internal carotid arteries are widely patent with antegrade flow. The left ICA is somewhat diminutive as compared to the right. Petrous segments and cavernous segments are patent bilaterally. Again, there is occlusion of the supraclinoid left ICA, similar to prior. Supraclinoid right ICA is widely patent. There is a widely patent right A1 segment supplying both anterior cerebral arteries which are well opacified. Right M1 segment widely patent without stenosis or occlusion. MCA bifurcation on the right is normal. Distal MCA branches on the right are well opacified.  Left MCA artery and its branches are not visualize, stable from prior.  POSTERIOR CIRCULATION:  Both vertebral arteries are widely patent to the vertebrobasilar junction. Posterior inferior cerebral arteries not well evaluated on this exam. Basilar artery widely patent. Right anterior inferior cerebellar artery is dominant. Superior cerebellar arteries patent bilaterally. Right posterior cerebral artery widely patent. There is fetal origin of the left PCA with widely patent left posterior  communicating artery. This is supplied via the left ICA just prior to its occlusion at its supraclinoid segment. Left PCA is widely patent.  No aneurysm.  IMPRESSION: MRI HEAD IMPRESSION:  1. Patchy acute ischemic infarcts involving the cortical gray matter of the right frontal parietal lobes as well as the right occipital lobe, with additional infarct involving the right caudate head. These infarcts are in a right MCA territory distribution. No associated hemorrhage or significant mass effect. 2. Remote bilateral frontal lobe infarcts as detailed above. 3. Mild atrophy with chronic small vessel ischemic disease.  MRA HEAD IMPRESSION:  1. Chronic occlusion of the supraclinoid left ICA with absent flow within the left middle cerebral artery and  its branches distally. This is stable relative to previous MRA from 2013. 2. No new proximal branch occlusion or hemodynamically significant stenosis identified. 3. Anterior cerebral arteries supplied via a widely patent right A1 segment. 4. Fetal origin of the left PCA, supplied via the left internal carotid artery just prior to its occlusion at its supraclinoid segment.   Electronically Signed   By: Jeannine Boga M.D.   On: 11/20/2014 02:20    2D Echo:  60% EF, mild LVH, mild MR, mild dilated left atrium.    Admission HPI:  Chief Complaint: Syncope   History of Present Illness:   Patient is a 59 year old male with a history of HIV, hepatitis C, COPD, hypertension, type 2 diabetes, depression who presented to the ED for an episode of loss of consciousness. Patient states that he was pushing his shopping cart in the parking lot of a grocery store when he started feeling dizzy. He stated that he felt the parking lot was spinning around him and that his vision was getting dark. He states that he remembers hitting the ground but does not remember specifically if he had hit any particular part of his body on the ground. His wife states that there was a period  of 10-15 seconds when the patient was on the ground and staring off into space and was not responsive. She denies any jerking movements in the patient's extremities. Patient denies any associated tongue biting or bowel/bladder incontinence. He states that he did not have a period of confusion after this. His wife and he states that he may have had a period of time up until he presented to the emergency department when his speech may have been a bit slurred. Upon interview, patient states that he is return to his normal baseline. Patient reports a CVA from 2013 with no residual deficits. Patient states that he has generally been eating and drinking well.  Otherwise, patient denies any fevers, chills, chest pain, dyspnea, palpitations, abdominal pain, nausea, vomiting, constipation, diarrhea, dysuria, or hematuria.  Admission physical exam:   Blood pressure 145/85, pulse 75, temperature 98 F (36.7 C), temperature source Oral, resp. rate 14, SpO2 98 %. General: resting in bed HEENT: PERRL, EOMI, no scleral icterus Cardiac: RRR, no rubs, murmurs or gallops Pulm: clear to auscultation bilaterally, moving normal volumes of air Abd: soft, nontender, nondistended, BS present Ext: warm and well perfused, no pedal edema Neuro: alert and oriented X3 - cranial nerves II through XII intact - motor: Strength 5+ throughout all 4 extremities - Sensation: Intact to light touch throughout all 4 extremities - Reflexes: 1+ patellar reflex on left, otherwise 2+ deep tendon reflexes throughout all 4 extremities - cerebellar: Finger to nose and heel to shin intact, negative Romberg sign, no pronator drift - Gait: Normal gait Skin: no rashes or lesions noted Psych: appropriate affect   Hospital Course by problem list: #Stroke #Syncope, resolved #Human immunodeficiency virus (HIV) disease controlled/HCV #Polysubstance/tobacco abuse #Essential hypertension #COPD, moderate (stable) #Diabetes mellitus,  type 2 #Depression/Anxiety/Insomia  #Onychomycosis #recent Sialadenitis  #Herpes     59 y.o pmh HTN, HIV, depression, h/o stroke, latent TB, hep C on Harvoni. He presented for syncope, dizziness found to have stroke on MRI.    #Stroke CT 11/19/14 Atrophy and chronic ischemic white matter disease without acute intracranial abnormality. MRI 5/3 with patchy acute ischemic infarcts involving the cortical gray matter of the right frontal parietal lobes as well as the right occipital lobe, with additional infarct involving the right  caudate head. These infarcts are in a right MCA territory distribution. No associated hemorrhage or significant mass effect. Remote bilateral frontal lobe infarcts. mild atrophy with chronic small vessel ischemic disease. 5/3 MRA HEAD IMPRESSION: Chronic occlusion of the supraclinoid left ICA with absent flow within the left middle cerebral artery and its branches distally. This is stable relative to previous MRA from 2013. No new proximal branch occlusion or hemodynamically significant stenosis identified. Anterior cerebral arteries supplied via a widely patent right A1 segment. Fetal origin of the left PCA, supplied via the left internal carotid artery just prior to its occlusion at its supraclinoid segment. 5/3 [place] 1-39% ICA stenosis b/l. TTE 5/4 60%, mild LVH, mild MR, LA mild dilated   He will need follow up with Neuro in 2-3 months. Ensure he has follow up appointment scheduled at hospital follow appointment.  He will follow up with [facility] 12/06/14 10:45 am Dr. Hulen Luster.  He will need Holter monitor.   Please assess this has been set up outpatient at [facility] hospital follow up.  Continue Aspirin 81, Pravastatin 40, risk factor control (i.e A1C 6.6 10/2014 will need to be on oral glycemic agent). Emphasized compliance.    #Syncope, resolved Orthostatics negative.  Troponin negative x 3.  Will need Holter monitor at home.  Ensure has been given Holter monitor at home.     #Human  immunodeficiency virus (HIV) disease controlled/HCV Follows with ID outpatient with upcoming appt. Continue Prezista, Cory Munch  #Polysubstance/tobacco abuse Counseled on smoking cessation. UDS neg this admission   #Essential hypertension Resumed home blood pressure meds (Lisinopril 20 mg qd, Lopressor 25 mg bid)  #COPD, moderate Stable. Resume prn Albuterol neb/inhaler, Advair, Dulera, Spiriva   #Diabetes mellitus, type 2 Resume Glipizide 5 mg qam. Patient stated on day of discharge he was not taking.  Assess compliance at outpatient appointment/   #Depression/Anxiety/Insomia  Continue Celexa 20 mg, Vistaril 50 mg qd, Seroquel 300 XL qhs, resume Ambien  #Onychomycosis Follow up scheduled outpatient appointment. Continue antifungal cream, Lamisil   #recent Sialadenitis  -Continue Keflex  #Herpes  Resume Valtrex   #DVT px  Lovenox while inpatient   Discharge Vitals:   BP 135/72 mmHg  Pulse 72  Temp(Src) 98 F (36.7 C) (Oral)  Resp 16  Ht 6' 0.01" (1.829 m)  Wt 206 lb 14.4 oz (93.849 kg)  BMI 28.05 kg/m2  SpO2 98%  Discharge physical exam General: resting in bed, NAD HEENT: /at, no scleral icterus Cardiac: RRR, no rubs, murmurs or gallops Pulm: clear to auscultation bilaterally, no wheezes, rales, or rhonchi Abd: soft, nontender, nondistended, BS present Ext: warm and well perfused, no pedal edema Neuro: alert and oriented X3, cranial nerves II-XII grossly intact, strength 5/5 all extremities, no sensation deficits Discharge Labs:  Results for orders placed or performed during the hospital encounter of 11/19/14 (from the past 24 hour(s))  Glucose, capillary     Status: Abnormal   Collection Time: 11/21/14  4:36 PM  Result Value Ref Range   Glucose-Capillary 105 (H) 70 - 99 mg/dL   Comment 1 Notify RN    Comment 2 Document in Chart   Glucose, capillary     Status: Abnormal   Collection Time: 11/21/14  8:07 PM  Result Value Ref Range    Glucose-Capillary 112 (H) 70 - 99 mg/dL  Glucose, capillary     Status: Abnormal   Collection Time: 11/22/14  7:30 AM  Result Value Ref Range   Glucose-Capillary 108 (H) 70 - 99  mg/dL  Glucose, capillary     Status: Abnormal   Collection Time: 11/22/14 11:42 AM  Result Value Ref Range   Glucose-Capillary 125 (H) 70 - 99 mg/dL   Results for Jacob, Joseph (MRN [id number]) as of 11/22/2014 13:37  Ref. Range 11/19/2014 21:30 11/20/2014 03:11 11/20/2014 09:27 11/20/2014 14:32 11/21/2014 04:20  Sodium Latest Ref Range: 135-145 mmol/L  137   137  Potassium Latest Ref Range: 3.5-5.1 mmol/L  3.9   4.0  Chloride Latest Ref Range: 101-111 mmol/L  106   106  CO2 Latest Ref Range: 22-32 mmol/L  20 (L)   23  BUN Latest Ref Range: 6-20 mg/dL  17   13  Creatinine Latest Ref Range: 0.61-1.24 mg/dL  1.09   0.99  Calcium Latest Ref Range: 8.9-10.3 mg/dL  8.8 (L)   9.0  EGFR (Non-African Amer.) Latest Ref Range: >60 mL/min  >60   >60  EGFR (African American) Latest Ref Range: >60 mL/min  >60   >60  Glucose Latest Ref Range: 70-99 mg/dL  105 (H)   130 (H)  Anion gap Latest Ref Range: 5-_0 Troponin I Latest Ref Range: <0.031 ng/mL <0.03 <0.03 <0.03    Cholesterol Latest Ref Range: 0-200 mg/dL    169   Triglycerides Latest Ref Range: <150 mg/dL    142   HDL Cholesterol Latest Ref Range: >40 mg/dL    29 (L)   LDL (calc) Latest Ref Range: 0-99 mg/dL    112 (H)   VLDL Latest Ref Range: 0-40 mg/dL    28   Total CHOL/HDL Ratio Latest Units: RATIO    5.8   Results for Jacob, Joseph (MRN [id number]) as of 11/22/2014 13:37  Ref. Range 11/19/2014 16:07  WBC Latest Ref Range: 4.0-10.5 K/uL 7.6  RBC Latest Ref Range: 4.22-5.81 MIL/uL 4.82  Hemoglobin Latest Ref Range: 13.0-17.0 g/dL 13.8  HCT Latest Ref Range: 39.0-52.0 % 41.8  MCV Latest Ref Range: 78.0-100.0 fL 86.7  MCH Latest Ref Range: 26.0-34.0 pg 28.6  MCHC Latest Ref Range: 30.0-36.0 g/dL 33.0  RDW Latest Ref Range: 11.5-15.5 % 14.7  Platelets Latest Ref  Range: 150-400 K/uL 193   Results for Jacob, Joseph (MRN [id number]) as of 11/22/2014 13:37  Ref. Range 11/12/2014 14:00  CD4 T Cell Abs Latest Ref Range: 912-410-6571 /uL 400  CD4 % Helper T Cell Latest Ref Range: 33-55 % 18 (L)   Results for Jacob, Joseph (MRN [id number]) as of 11/22/2014 13:37  Ref. Range 11/12/2014 13:57  HIV 1 RNA Quant Latest Ref Range: <20 copies/mL <20  HIV1 RNA Quant, Log Latest Ref Range: <1.30 log 10 <1.30   Results for Jacob, Joseph (MRN [id number]) as of 11/22/2014 13:37  Ref. Range 11/19/2014 16:50  Appearance Latest Ref Range: CLEAR  CLEAR  Bilirubin Urine Latest Ref Range: NEGATIVE  NEGATIVE  Color, Urine Latest Ref Range: YELLOW  YELLOW  Glucose Latest Ref Range: NEGATIVE mg/dL >1000 (A)  Hgb urine dipstick Latest Ref Range: NEGATIVE  NEGATIVE  Ketones, ur Latest Ref Range: NEGATIVE mg/dL 15 (A)  Leukocytes, UA Latest Ref Range: NEGATIVE  NEGATIVE  Nitrite Latest Ref Range: NEGATIVE  NEGATIVE  pH Latest Ref Range: 5.0-8.0  5.0  Protein Latest Ref Range: NEGATIVE mg/dL NEGATIVE  RBC / HPF Latest Ref Range: <3 RBC/hpf 3-6  Specific Gravity, Urine Latest Ref Range: 1.005-1.030  1.023  Squamous Epithelial / LPF Latest Ref Range: RARE  FEW (A)  Urobilinogen, UA Latest Ref Range: 0.0-1.0  mg/dL 1.0  WBC, UA Latest Ref Range: <3 WBC/hpf 0-2   Results for Jacob, Joseph (MRN [id number]) as of 11/22/2014 13:37  Ref. Range 11/19/2014 16:50  Amphetamines Latest Ref Range: NONE DETECTED  NONE DETECTED  Barbiturates Latest Ref Range: NONE DETECTED  NONE DETECTED  Benzodiazepines Latest Ref Range: NONE DETECTED  NONE DETECTED  Opiates Latest Ref Range: NONE DETECTED  NONE DETECTED  COCAINE Latest Ref Range: NONE DETECTED  NONE DETECTED  Tetrahydrocannabinol Latest Ref Range: NONE DETECTED  NONE DETECTED    Signed: Cresenciano Genre, MD 11/22/2014, 1:59 PM    Services Ordered on Discharge: none Equipment Ordered on Discharge: none

## 2014-11-23 ENCOUNTER — Other Ambulatory Visit: Payer: Self-pay | Admitting: Internal Medicine

## 2014-11-23 ENCOUNTER — Ambulatory Visit: Payer: Medicare Other | Admitting: Pharmacist

## 2014-11-23 DIAGNOSIS — B20 Human immunodeficiency virus [HIV] disease: Secondary | ICD-10-CM

## 2014-11-26 ENCOUNTER — Encounter: Payer: Self-pay | Admitting: Podiatry

## 2014-11-26 ENCOUNTER — Ambulatory Visit (INDEPENDENT_AMBULATORY_CARE_PROVIDER_SITE_OTHER): Payer: Medicare Other | Admitting: Podiatry

## 2014-11-26 VITALS — BP 100/61 | HR 81 | Temp 99.0°F | Resp 14

## 2014-11-26 DIAGNOSIS — M79676 Pain in unspecified toe(s): Secondary | ICD-10-CM | POA: Diagnosis not present

## 2014-11-26 DIAGNOSIS — B351 Tinea unguium: Secondary | ICD-10-CM

## 2014-11-26 DIAGNOSIS — R0989 Other specified symptoms and signs involving the circulatory and respiratory systems: Secondary | ICD-10-CM

## 2014-11-26 NOTE — Progress Notes (Signed)
   Subjective:    Patient ID: Jacob Joseph, male    DOB: [birth year], 59 y.o.   MRN: [id number]  HPI N- discolored and very thick  but not painful, left pinky toenail is long L-B/L toes, and left pinky toe D- 20-30 years O-slowly C-toenails very thick and pinky toe is very long A-can't wear normal shoes T-none " Patient presents with discolored , thick nails and the left pinky toenail is long and seems to keep growing and bothers and touches  his other toe."  Review of Systems  Constitutional: Positive for appetite change.  Respiratory: Positive for shortness of breath.   Genitourinary: Positive for urgency.  Musculoskeletal: Positive for back pain.   denies history of foot ulceration or claudication    Objective:   Physical Exam  Patient appears orientated 3 and his wife is in the treatment room today  Vascular: DP 0/4 bilaterally PT right 2/4 PT left 0/4 Capillary reflex immediate bilaterally  Neurological: Sensation to 10 g monofilament wire intact 3/5 bilaterally Vibratory sensation reactive bilaterally Ankle reflex reactive bilaterally  Dermatological: Dry moccasin scaling skin medially plantarly and laterally bilaterally The toenails are extremely elongated, hypertrophic, brittle, discolored and tender to back palpation 6-10  Musculoskeletal: HAV deformities bilaterally    Assessment & Plan:   Assessment: Decrease pedal pulses suggestive peripheral arterial disease Mild peripheral neuropathy Extremely neglected symptomatic onychomycoses 6-10 Tinea pedis bilaterally  HIV-positive  Plan: Reviewed results of patient exam today Patient is currently taking terbinafine and advise attempted complete all previous prescribed terbinafine for 90 days  Nails 10 are debrided without any bleeding  Reappoint 3 months

## 2014-11-26 NOTE — Patient Instructions (Signed)
Our office will contact the results of the lower extremity arterial Doppler Diabetes and Foot Care Diabetes may cause you to have problems because of poor blood supply (circulation) to your feet and legs. This may cause the skin on your feet to become thinner, break easier, and heal more slowly. Your skin may become dry, and the skin may peel and crack. You may also have nerve damage in your legs and feet causing decreased feeling in them. You may not notice minor injuries to your feet that could lead to infections or more serious problems. Taking care of your feet is one of the most important things you can do for yourself.  HOME CARE INSTRUCTIONS  Wear shoes at all times, even in the house. Do not go barefoot. Bare feet are easily injured.  Check your feet daily for blisters, cuts, and redness. If you cannot see the bottom of your feet, use a mirror or ask someone for help.  Wash your feet with warm water (do not use hot water) and mild soap. Then pat your feet and the areas between your toes until they are completely dry. Do not soak your feet as this can dry your skin.  Apply a moisturizing lotion or petroleum jelly (that does not contain alcohol and is unscented) to the skin on your feet and to dry, brittle toenails. Do not apply lotion between your toes.  Trim your toenails straight across. Do not dig under them or around the cuticle. File the edges of your nails with an emery board or nail file.  Do not cut corns or calluses or try to remove them with medicine.  Wear clean socks or stockings every day. Make sure they are not too tight. Do not wear knee-high stockings since they may decrease blood flow to your legs.  Wear shoes that fit properly and have enough cushioning. To break in new shoes, wear them for just a few hours a day. This prevents you from injuring your feet. Always look in your shoes before you put them on to be sure there are no objects inside.  Do not cross your legs.  This may decrease the blood flow to your feet.  If you find a minor scrape, cut, or break in the skin on your feet, keep it and the skin around it clean and dry. These areas may be cleansed with mild soap and water. Do not cleanse the area with peroxide, alcohol, or iodine.  When you remove an adhesive bandage, be sure not to damage the skin around it.  If you have a wound, look at it several times a day to make sure it is healing.  Do not use heating pads or hot water bottles. They may burn your skin. If you have lost feeling in your feet or legs, you may not know it is happening until it is too late.  Make sure your health care provider performs a complete foot exam at least annually or more often if you have foot problems. Report any cuts, sores, or bruises to your health care provider immediately. SEEK MEDICAL CARE IF:   You have an injury that is not healing.  You have cuts or breaks in the skin.  You have an ingrown nail.  You notice redness on your legs or feet.  You feel burning or tingling in your legs or feet.  You have pain or cramps in your legs and feet.  Your legs or feet are numb.  Your feet always feel  cold. SEEK IMMEDIATE MEDICAL CARE IF:   There is increasing redness, swelling, or pain in or around a wound.  There is a red line that goes up your leg.  Pus is coming from a wound.  You develop a fever or as directed by your health care provider.  You notice a bad smell coming from an ulcer or wound. Document Released: 07/03/2000 Document Revised: 03/08/2013 Document Reviewed: 12/13/2012 [facility] Patient Information 2015 [place], [place]. This information is not intended to replace advice given to you by your health care provider. Make sure you discuss any questions you have with your health care provider.

## 2014-11-27 ENCOUNTER — Encounter: Payer: Self-pay | Admitting: Internal Medicine

## 2014-11-27 ENCOUNTER — Ambulatory Visit (INDEPENDENT_AMBULATORY_CARE_PROVIDER_SITE_OTHER): Payer: Medicare Other | Admitting: Internal Medicine

## 2014-11-27 DIAGNOSIS — B182 Chronic viral hepatitis C: Secondary | ICD-10-CM

## 2014-11-27 NOTE — Progress Notes (Signed)
Patient ID: Jacob Joseph, male   DOB: August 30, 1955, 59 y.o.   MRN: [id number]          Patient Active Problem List   Diagnosis Date Noted  . Human immunodeficiency virus (HIV) disease 04/27/2006    Priority: High  . Chronic hepatitis C without hepatic coma 04/27/2006    Priority: High  . Polysubstance abuse 04/27/2006    Priority: High  . Embolic cerebral infarction   . Faintness   . Syncope 11/19/2014  . Nail fungus 09/26/2014  . Genital herpes 08/29/2014  . Cataracts, bilateral 08/29/2014  . Tachycardia 11/30/2013  . Diabetes mellitus, type 2 10/19/2013  . Sinus tachycardia 10/19/2013  . Lipodystrophy 10/19/2013  . COPD, moderate 05/31/2013  . Bilateral lower extremity pain 05/08/2013  . Loss of weight 04/24/2013  . DVT, lower extremity 04/18/2013  . Complex Lt Renal Cyst 03/22/2013  . Preventive measure 05/30/2012  . Normocytic anemia 03/08/2012  . Leukopenia 03/08/2012  . CKD Stage 2 03/08/2012  . ERECTILE DYSFUNCTION 04/25/2008  . INSOMNIA, CHRONIC 06/14/2007  . TOBACCO USER 04/27/2006  . Major depressive disorder, recurrent episode 04/27/2006  . Essential hypertension 04/27/2006  . DEGENERATIVE JOINT DISEASE 04/27/2006  . Chronic Low Back Pain 04/27/2006    Patient's Medications  New Prescriptions   No medications on file  Previous Medications   ADVAIR DISKUS 100-50 MCG/DOSE AEPB    Inhale 1 puff into the lungs 2 (two) times daily.   ALBUTEROL (PROVENTIL HFA;VENTOLIN HFA) 108 (90 BASE) MCG/ACT INHALER    Inhale 2 puffs into the lungs every 6 (six) hours as needed for wheezing or shortness of breath.   ASPIRIN EC 81 MG EC TABLET    Take 1 tablet (81 mg total) by mouth daily.   CITALOPRAM (CELEXA) 20 MG TABLET    Take 20 mg by mouth daily.   ELVITEGRAVIR-COBICISTAT-EMTRICITABINE-TENOFOVIR (GENVOYA) 150-150-200-10 MG TABS TABLET    Take 1 tablet by mouth daily with breakfast.   GLIPIZIDE (GLUCOTROL) 5 MG TABLET    Take 1 tablet (5 mg total) by mouth daily before  breakfast.   HYDROXYPROPYL METHYLCELLULOSE / HYPROMELLOSE (ISOPTO TEARS / GONIOVISC) 2.5 % OPHTHALMIC SOLUTION    Place 1 drop into both eyes as needed for dry eyes.   HYDROXYZINE (VISTARIL) 50 MG CAPSULE    Take 50 mg by mouth daily.   LISINOPRIL (PRINIVIL,ZESTRIL) 20 MG TABLET    Take 1 tablet (20 mg total) by mouth daily.   METOPROLOL TARTRATE (LOPRESSOR) 25 MG TABLET    Take 1 tablet (25 mg total) by mouth 2 (two) times daily.   MICONAZOLE (MICATIN) 2 % CREAM    Apply 1 application topically 2 (two) times daily. Apply to affected areas   MOMETASONE-FORMOTEROL (DULERA) 100-5 MCG/ACT AERO    Inhale 2 puffs into the lungs 2 (two) times daily.   PRAVASTATIN (PRAVACHOL) 40 MG TABLET    Take 1 tablet (40 mg total) by mouth daily.   PREZISTA 800 MG TABLET    TAKE 1 TABLET BY MOUTH EVERY DAY WITH BREAKFAST   SEROQUEL XR 300 MG 24 HR TABLET    Take 300 mg by mouth at bedtime.   SPIRIVA HANDIHALER 18 MCG INHALATION CAPSULE    INHALE THE CONTENTS OF 1 CASPSULE USING HANDIHALER EVERY DAY   TERBINAFINE (LAMISIL) 250 MG TABLET    Take 1 tablet (250 mg total) by mouth daily.   VALACYCLOVIR (VALTREX) 500 MG TABLET    Take 1 tablet (500 mg total) by mouth 2 (  two) times daily.   ZOLPIDEM (AMBIEN) 10 MG TABLET    Take 10 mg by mouth at bedtime as needed. For sleep  Modified Medications   No medications on file  Discontinued Medications   CEPHALEXIN (KEFLEX) 500 MG CAPSULE    Take 1 capsule (500 mg total) by mouth 4 (four) times daily.   LEDIPASVIR-SOFOSBUVIR (HARVONI) 90-400 MG TABS    Take 1 tablet by mouth daily with breakfast.    Subjective: Marya Joseph is in for his routine HIV follow-up. He was recently hospitalized after a syncopal episode and was felt to have a new right MCV stroke. Fortunately he is recovered without sequelae. He has not missed any doses of his Genvoya or Prezista. He completed 3 months of Harvoni therapy a few weeks ago for his chronic hepatitis C. He has been drug free for over 4 months  and quit smoking when he was hospitalized recently.  Review of Systems: Constitutional: negative Eyes: negative Ears, nose, mouth, throat, and face: negative Respiratory: negative Cardiovascular: negative Gastrointestinal: negative Genitourinary:negative  Past Medical History  Diagnosis Date  . Hypertension   . HIV (human immunodeficiency virus infection)   . Depression   . Stroke 11/2011    Carotids Doppler negative. Right sided weakness resolved, initially on Plavix for 3 months and then continued with ASA.    . TB lung, latent 1988    Treated  . Calcium oxalate crystals in urine 12/23/2011    Asymptomatic, no hematuria. Advised to take plenty of water.  . Hepatitis C   . Diabetes mellitus without complication     pre diabetic    History  Substance Use Topics  . Smoking status: Former Smoker -- 0.50 packs/day for 41 years    Types: Cigarettes    Quit date: 11/18/2014  . Smokeless tobacco: Never Used     Comment: currently stopped  . Alcohol Use: No    Family History  Problem Relation Age of Onset  . Hypertension Mother   . Hypertension Father   . Cancer Father     Allergies  Allergen Reactions  . Statins Other (See Comments)    Elevated liver enzymes.  . Metformin And Related Diarrhea    Objective: Temp: 98.1 F (36.7 C) (05/10 0936) Temp Source: Oral (05/10 0936) Body mass index is 28.58 kg/(m^2).  General: He is in good spirits Oral: No oropharyngeal lesions Skin: No rash Lungs: Clear Cor: Regular S1 and S2 with no murmurs Abdomen: Soft and nontender Joints and extremities: Some muscle wasting of his right hand muscles which is chronic and unchanged  Lab Results Lab Results  Component Value Date   WBC 7.6 11/19/2014   HGB 13.8 11/19/2014   HCT 41.8 11/19/2014   MCV 86.7 11/19/2014   PLT 193 11/19/2014    Lab Results  Component Value Date   CREATININE 0.99 11/21/2014   BUN 13 11/21/2014   NA 137 11/21/2014   K 4.0 11/21/2014   CL 106  11/21/2014   CO2 23 11/21/2014    Lab Results  Component Value Date   ALT 15* 11/19/2014   AST 23 11/19/2014   ALKPHOS 87 11/19/2014   BILITOT 0.7 11/19/2014    Lab Results  Component Value Date   CHOL 169 11/20/2014   HDL 29* 11/20/2014   LDLCALC 112* 11/20/2014   TRIG 142 11/20/2014   CHOLHDL 5.8 11/20/2014    Lab Results HIV 1 RNA QUANT (copies/mL)  Date Value  11/12/2014 <20  10/04/2014 <20  09/10/2014  77*   CD4 T CELL ABS (/uL)  Date Value  11/12/2014 400  09/10/2014 450  06/25/2014 350*     Assessment: His HIV infection is under excellent control.  I suspect his chronic hepatitis C has been cured.  I congratulated him on being drug free for over 4 months.  I encouraged him to go through with this plan to quit smoking cigarettes completely.  Plan: 1. Continue Genvoya and Prezista 2. Follow-up here after hepatitis C viral load in 3 months   Michel Bickers, MD [facility] [phone] pager   [phone] cell 11/27/2014, 10:02 AM

## 2014-11-30 ENCOUNTER — Ambulatory Visit (HOSPITAL_COMMUNITY)
Admission: RE | Admit: 2014-11-30 | Discharge: 2014-11-30 | Disposition: A | Discharge status: Home / Self Care | Payer: Medicare Other | Source: Ambulatory Visit | Attending: Podiatry | Admitting: Podiatry

## 2014-11-30 DIAGNOSIS — R0989 Other specified symptoms and signs involving the circulatory and respiratory systems: Secondary | ICD-10-CM | POA: Insufficient documentation

## 2014-11-30 DIAGNOSIS — I70203 Unspecified atherosclerosis of native arteries of extremities, bilateral legs: Secondary | ICD-10-CM | POA: Diagnosis not present

## 2014-11-30 DIAGNOSIS — M79676 Pain in unspecified toe(s): Secondary | ICD-10-CM | POA: Insufficient documentation

## 2014-12-04 ENCOUNTER — Ambulatory Visit: Payer: Medicare Other | Admitting: *Deleted

## 2014-12-04 ENCOUNTER — Encounter: Payer: Self-pay | Admitting: *Deleted

## 2014-12-04 DIAGNOSIS — F1410 Cocaine abuse, uncomplicated: Secondary | ICD-10-CM

## 2014-12-04 NOTE — BH Specialist Note (Signed)
Jacob Joseph was present for his scheduled appointment today.  Client was oriented times four with good affect but physically moving a little slow.  Client appeared a little tired as evidenced by yawning and leaning over the table while he talked.  Client was cooperative and talkative with counselor sharing about his experience of having a stroke week before last. Client indicated that he is not using and has completed his substance abuse treatment at [facility].  Counselor asked client what the biggest thing he learned about going through the treatment was and he stated learning that he is using drugs and alcohol to cope with life's struggles.  Client said that he also gained necessary information about boundaries and avoiding high risk situations. Client stated that he was continuing to attend the [facility] support group weekly.  Counselor educated client on the necessity of having an aftercare plan for recovery to ensure staying clean.  Counselor talked with client about his health scare and how things could have turned out differently had he been high and/or his spouse high at the time.  Client communicated that he was glad that they both were sober and that his wife specifically had her wits about her to call EMS and get him to the hospital in a timely manner to reverse the effects of the stroke. Counselor discussed with client about relapse drift and how if we are not anchored down with secure mooring lines how we can drift to uncharted/unsafe waters.  Counselor encouraged client to stay in counseling for a while and continue attending [facility] for support.  Client agreed that he would and recognized the significance of doing so.   Rolena Infante, LPCA, MA [facility]

## 2014-12-06 ENCOUNTER — Encounter: Payer: Medicare Other | Admitting: Internal Medicine

## 2014-12-06 ENCOUNTER — Ambulatory Visit (INDEPENDENT_AMBULATORY_CARE_PROVIDER_SITE_OTHER): Payer: Medicare Other | Admitting: Internal Medicine

## 2014-12-06 ENCOUNTER — Encounter: Payer: Self-pay | Admitting: Internal Medicine

## 2014-12-06 ENCOUNTER — Other Ambulatory Visit: Payer: Self-pay | Admitting: Internal Medicine

## 2014-12-06 VITALS — BP 112/69 | HR 68 | Temp 97.9°F | Ht 72.0 in | Wt 214.0 lb

## 2014-12-06 DIAGNOSIS — Z7982 Long term (current) use of aspirin: Secondary | ICD-10-CM | POA: Diagnosis not present

## 2014-12-06 DIAGNOSIS — E119 Type 2 diabetes mellitus without complications: Secondary | ICD-10-CM

## 2014-12-06 DIAGNOSIS — Z8673 Personal history of transient ischemic attack (TIA), and cerebral infarction without residual deficits: Secondary | ICD-10-CM | POA: Diagnosis not present

## 2014-12-06 DIAGNOSIS — B20 Human immunodeficiency virus [HIV] disease: Secondary | ICD-10-CM

## 2014-12-06 DIAGNOSIS — I639 Cerebral infarction, unspecified: Secondary | ICD-10-CM

## 2014-12-06 DIAGNOSIS — Z87891 Personal history of nicotine dependence: Secondary | ICD-10-CM | POA: Diagnosis not present

## 2014-12-06 MED ORDER — GLIPIZIDE 5 MG PO TABS: 5.0000 mg | ORAL_TABLET | Freq: Every day | ORAL | Status: DC

## 2014-12-06 NOTE — Progress Notes (Signed)
Subjective:   Patient ID: Jacob Joseph male   DOB: [birth year] 59 y.o.   MRN: [id number]  HPI: Jacob Joseph is a 59 y.o. man with a history of HIV (on HAART), HCV (s/p Harvoni), latent TB, depression, hypertension and DM2 who presents for hospital follow-up.   During his recent hospitalization (5/2 through 5/5), he was found to have an ischemic infarct in the right MCA distribution territory. He initially experienced some right and left weakness, but today, he does not have any sequelae; he feels quite well. His only risk factors for CVA are diabetes and tobacco abuse. He had been prescribed glipizide, but was not taking it.    Past Medical History  Diagnosis Date  . Hypertension   . HIV (human immunodeficiency virus infection)   . Depression   . Stroke 11/2011    Carotids Doppler negative. Right sided weakness resolved, initially on Plavix for 3 months and then continued with ASA.    . TB lung, latent 1988    Treated  . Calcium oxalate crystals in urine 12/23/2011    Asymptomatic, no hematuria. Advised to take plenty of water.  . Hepatitis C   . Diabetes mellitus without complication     pre diabetic   Current Outpatient Prescriptions  Medication Sig Dispense Refill  . ADVAIR DISKUS 100-50 MCG/DOSE AEPB Inhale 1 puff into the lungs 2 (two) times daily.  6  . albuterol (PROVENTIL HFA;VENTOLIN HFA) 108 (90 BASE) MCG/ACT inhaler Inhale 2 puffs into the lungs every 6 (six) hours as needed for wheezing or shortness of breath. 1 Inhaler 2  . aspirin EC 81 MG EC tablet Take 1 tablet (81 mg total) by mouth daily. 30 tablet 5  . citalopram (CELEXA) 20 MG tablet Take 20 mg by mouth daily.    [name] Kitchen elvitegravir-cobicistat-emtricitabine-tenofovir (GENVOYA) 150-150-200-10 MG TABS tablet Take 1 tablet by mouth daily with breakfast. 30 tablet 11  . glipiZIDE (GLUCOTROL) 5 MG tablet Take 1 tablet (5 mg total) by mouth daily before breakfast. 60 tablet 2  . hydroxypropyl methylcellulose /  hypromellose (ISOPTO TEARS / GONIOVISC) 2.5 % ophthalmic solution Place 1 drop into both eyes as needed for dry eyes.    . hydrOXYzine (VISTARIL) 50 MG capsule Take 50 mg by mouth daily.    [name] Kitchen lisinopril (PRINIVIL,ZESTRIL) 20 MG tablet Take 1 tablet (20 mg total) by mouth daily. 30 tablet 3  . metoprolol tartrate (LOPRESSOR) 25 MG tablet Take 1 tablet (25 mg total) by mouth 2 (two) times daily. 60 tablet 11  . miconazole (MICATIN) 2 % cream Apply 1 application topically 2 (two) times daily. Apply to affected areas (Patient not taking: Reported on 09/10/2014) 28.35 g 0  . mometasone-formoterol (DULERA) 100-5 MCG/ACT AERO Inhale 2 puffs into the lungs 2 (two) times daily. 2 Inhaler 6  . pravastatin (PRAVACHOL) 40 MG tablet Take 1 tablet (40 mg total) by mouth daily. 30 tablet 5  . PREZISTA 800 MG tablet TAKE 1 TABLET BY MOUTH EVERY DAY WITH BREAKFAST 30 tablet 5  . SEROQUEL XR 300 MG 24 hr tablet Take 300 mg by mouth at bedtime.    [name] Kitchen SPIRIVA HANDIHALER 18 MCG inhalation capsule INHALE THE CONTENTS OF 1 CASPSULE USING HANDIHALER EVERY DAY 30 capsule 12  . terbinafine (LAMISIL) 250 MG tablet Take 1 tablet (250 mg total) by mouth daily. 42 tablet 0  . valACYclovir (VALTREX) 500 MG tablet Take 1 tablet (500 mg total) by mouth 2 (two) times daily. (Patient taking differently: Take 500  mg by mouth daily. ) 180 tablet 3  . zolpidem (AMBIEN) 10 MG tablet Take 10 mg by mouth at bedtime as needed. For sleep     No current facility-administered medications for this visit.   Family History  Problem Relation Age of Onset  . Hypertension Mother   . Hypertension Father   . Cancer Father    History   Social History  . Marital Status: Married    Spouse Name: N/A  . Number of Children: N/A  . Years of Education: N/A   Social History Main Topics  . Smoking status: Former Smoker -- 0.50 packs/day for 41 years    Types: Cigarettes    Quit date: 11/18/2014  . Smokeless tobacco: Never Used     Comment:  currently stopped  . Alcohol Use: No  . Drug Use: No     Comment:  stopped 4 months ago (5/10/116)  . Sexual Activity: Not on file     Comment: declined condoms   Other Topics Concern  . None   Social History Narrative   Pt lives with wife and grandkid in [place].   Has applied for disability and is pending.   Worked in [employer] before about 3 years ago.         Review of Systems: General: no recent illness Skin: no rashes or lesions HEENT: no headaches, no blurred vision, no pain in eyes Cardiac: no chest pain or palpitations Respiratory: no shortness of breath or cough GI: no nausea, diarrhea, or constipation Urinary: no changes Msk: normal range of motion Neurologic: no numbness, tingling, weakness or confusion Psychiatric: no depressed mood  Objective:  Physical Exam: Filed Vitals:   12/06/14 1056  BP: 112/69  Pulse: 68  Temp: 97.9 F (36.6 C)  TempSrc: Oral  Height: 6' (1.829 m)  Weight: 214 lb (97.07 kg)  SpO2: 100%   Appearance: in NAD, quiet demeanor HEENT: AT/[place], PERRL, EOMi, no lymphadenopathy Heart: RRR, normal S1S2, no murmurs Lungs: CTAB, no wheezes Abdomen: BS+, soft, nontender Musculoskeletal: normal range of motion Extremities: no edema b/l Neurologic: A&Ox3, strength 5/5 throughout, full sensation, normal coordination, normal speech Skin: no rashes or lesion  Assessment & Plan:   Please see problem oriented charting for assessment & plan  Venita Lick, MD

## 2014-12-06 NOTE — Patient Instructions (Signed)
You will be getting a call about the holter monitor, which will look into your heart function and may help to tell why you had the stroke.  Please resume taking the glipizide that you used to take for your diabetes. This has been ordered for you and should be waiting at your pharmacy.  General Instructions:   Please bring your medicines with you each time you come to clinic.  Medicines may include prescription medications, over-the-counter medications, herbal remedies, eye drops, vitamins, or other pills.   Progress Toward Treatment Goals:  Treatment Goal 10/31/2014  Hemoglobin A1C deteriorated  Blood pressure unchanged  Stop smoking smoking the same amount  Prevent falls -    Self Care Goals & Plans:  Self Care Goal 12/06/2014  Manage my medications take my medicines as prescribed; bring my medications to every visit; refill my medications on time  Monitor my health keep track of my blood glucose; bring my glucose meter and log to each visit; keep track of my blood pressure; check my feet daily; keep track of my weight  Eat healthy foods eat more vegetables; eat foods that are low in salt; eat baked foods instead of fried foods  Be physically active take a walk every day  Stop smoking -  Prevent falls -  Meeting treatment goals -    Home Blood Glucose Monitoring 10/31/2014  Check my blood sugar once a day  When to check my blood sugar before breakfast     Care Management & Community Referrals:  Referral 10/31/2014  Referrals made for care management support none needed  Referrals made to community resources -

## 2014-12-09 DIAGNOSIS — I639 Cerebral infarction, unspecified: Secondary | ICD-10-CM | POA: Insufficient documentation

## 2014-12-09 NOTE — Assessment & Plan Note (Signed)
Patient suffered a new right MCV stroke without sequelae and was evaluated at his recent hospitalization. His symptoms continue to be resolved. His risk factors for stroke are diabetes (A1c6.6%, but patient was not taking his glipizide) and tobacco abuse.  - Encouraged patient on quitting smoking 11/18/14 - Patient has resumed glipizide therapy - Holter monitor set up; patient to pick it up at [facility]

## 2014-12-10 LAB — GLUCOSE, CAPILLARY: GLUCOSE-CAPILLARY: 163 mg/dL — AB (ref 65–99)

## 2014-12-11 ENCOUNTER — Other Ambulatory Visit: Payer: Self-pay | Admitting: *Deleted

## 2014-12-11 DIAGNOSIS — I739 Peripheral vascular disease, unspecified: Secondary | ICD-10-CM

## 2014-12-11 NOTE — Progress Notes (Signed)
Internal Medicine Clinic Attending  Case discussed with Dr. Mallory at the time of the visit.  We reviewed the resident's history and exam and pertinent patient test results.  I agree with the assessment, diagnosis, and plan of care documented in the resident's note. 

## 2014-12-18 ENCOUNTER — Ambulatory Visit: Payer: Medicare Other | Admitting: *Deleted

## 2014-12-25 ENCOUNTER — Ambulatory Visit: Payer: Medicare Other | Admitting: *Deleted

## 2014-12-25 DIAGNOSIS — F1410 Cocaine abuse, uncomplicated: Secondary | ICD-10-CM

## 2014-12-25 NOTE — BH Specialist Note (Signed)
Oshay was present today for his scheduled appointment with counselor.  Client was oriented times four with good affect and dress. Client was alert but moving slowly as he indicated that his body ached.  Client shared that otherwise he was feeling pretty good.  Client reported no substance use and that he will be clean for 6 months on the 13 th of this month.  Counselor gave client props for working so hard on his recovery.  Counselor educated client on self care and boundaries.  Counselor educated client further on continuing with his purpose and passion about his recovery and sobriety in general.  Client communicated that his wife relapsed and when she did she tried to encourage him to participate.  Client reported that he stood strong and did not give in to temptation.  Counselor discussed setting boundaries with his wife and explaining to her that he does not want to be involved with drugs any longer.  Client stated that his wife is back on track and is continuing on with her recovery efforts and is still attending outpatient substance abuse treatment.  Counselor explained to client that if and when he feels uncomfortable in a situation it is probably because his personal boundary is being tested.  Client agreed and shared that he will be acutely aware and mindful of this.  Client stated he is focusing on staying busy and productive.  Client is going out of town on vacation this upcoming weekend with his support group at [employer].  Also, client is preparing for a trip to [place] in order to visit with his son and grandchildren the following week.  Counselor encouraged client to make sure that his family in [place] is fully aware of his recovery process and that he will not be participating in any drugs or alcohol.  Client stated that he has actually already communicated it and they are very supportive and proud of him.    Rolena Infante, LPCA, MA [facility]

## 2014-12-26 ENCOUNTER — Other Ambulatory Visit: Payer: Self-pay | Admitting: Internal Medicine

## 2014-12-26 ENCOUNTER — Ambulatory Visit (INDEPENDENT_AMBULATORY_CARE_PROVIDER_SITE_OTHER): Payer: Medicare Other

## 2014-12-26 ENCOUNTER — Ambulatory Visit (INDEPENDENT_AMBULATORY_CARE_PROVIDER_SITE_OTHER): Payer: Medicare Other | Admitting: Cardiovascular Disease

## 2014-12-26 ENCOUNTER — Encounter: Payer: Self-pay | Admitting: Cardiovascular Disease

## 2014-12-26 VITALS — BP 112/60 | HR 72 | Ht 72.0 in | Wt 220.0 lb

## 2014-12-26 DIAGNOSIS — M79605 Pain in left leg: Secondary | ICD-10-CM | POA: Diagnosis not present

## 2014-12-26 DIAGNOSIS — R55 Syncope and collapse: Secondary | ICD-10-CM

## 2014-12-26 DIAGNOSIS — M79604 Pain in right leg: Secondary | ICD-10-CM

## 2014-12-26 DIAGNOSIS — I639 Cerebral infarction, unspecified: Secondary | ICD-10-CM

## 2014-12-26 NOTE — Progress Notes (Signed)
12/26/2014 Jacob Joseph   Dec 12, 1955  [id number]  Primary Physician Jessee Avers, MD Primary Cardiologist: Lorretta Harp MD Renae Gloss   HPI:  [name] was referred to me by Dr. Amalia Hailey from [facility] for peripheral vascular evaluation. He is accompanied by his wife Jacob Joseph. He is a 59 year old moderately overweight married African [place] male father of 9 children, grandfather of 43 grandchildren who currently does not work and is on disability because of multiple strokes in the past. His cardiovascular risk factor profile is notable for 100-pack-years of tobacco abuse having recently just discontinued this because of the stroke. History of hypertension, hypokalemia, diabetes and family history. His brother has had a stent. He has never had a heart attack. He has had 5 strokes by his account the past. He denies chest pain but does get dyspneic on exertion. His history is otherwise notable for HIV, hepatitis C and latent TB. He denies life limiting claudication. He does have pain in both legs worse with standing which sounds like pseudo-claudication. There are no open wounds or evidence of critical limb ischemia.   Current Outpatient Prescriptions  Medication Sig Dispense Refill  . ADVAIR DISKUS 100-50 MCG/DOSE AEPB Inhale 1 puff into the lungs 2 (two) times daily.  6  . aspirin EC 81 MG EC tablet Take 1 tablet (81 mg total) by mouth daily. 30 tablet 5  . citalopram (CELEXA) 20 MG tablet Take 20 mg by mouth daily.    [name] Kitchen elvitegravir-cobicistat-emtricitabine-tenofovir (GENVOYA) 150-150-200-10 MG TABS tablet Take 1 tablet by mouth daily with breakfast. 30 tablet 11  . glipiZIDE (GLUCOTROL) 5 MG tablet Take 1 tablet (5 mg total) by mouth daily before breakfast. 60 tablet 2  . hydroxypropyl methylcellulose / hypromellose (ISOPTO TEARS / GONIOVISC) 2.5 % ophthalmic solution Place 1 drop into both eyes as needed for dry eyes.    . hydrOXYzine (VISTARIL) 50 MG capsule Take 50  mg by mouth daily.    [name] Kitchen lisinopril (PRINIVIL,ZESTRIL) 20 MG tablet Take 1 tablet (20 mg total) by mouth daily. 30 tablet 3  . metoprolol tartrate (LOPRESSOR) 25 MG tablet Take 1 tablet (25 mg total) by mouth 2 (two) times daily. 60 tablet 11  . pravastatin (PRAVACHOL) 40 MG tablet Take 1 tablet (40 mg total) by mouth daily. 30 tablet 5  . PREZISTA 800 MG tablet TAKE 1 TABLET BY MOUTH EVERY DAY WITH BREAKFAST 30 tablet 5  . QUEtiapine (SEROQUEL) 25 MG tablet Take 25 mg by mouth 2 (two) times daily.     . SEROQUEL XR 300 MG 24 hr tablet Take 300 mg by mouth at bedtime.    [name] Kitchen SPIRIVA HANDIHALER 18 MCG inhalation capsule INHALE THE CONTENTS OF 1 CASPSULE USING HANDIHALER EVERY DAY 30 capsule 12  . terbinafine (LAMISIL) 250 MG tablet Take 1 tablet (250 mg total) by mouth daily. 42 tablet 0  . valACYclovir (VALTREX) 500 MG tablet Take 1 tablet (500 mg total) by mouth 2 (two) times daily. (Patient taking differently: Take 500 mg by mouth daily. ) 180 tablet 3  . zolpidem (AMBIEN) 10 MG tablet Take 10 mg by mouth at bedtime as needed. For sleep     No current facility-administered medications for this visit.    Allergies  Allergen Reactions  . Statins Other (See Comments)    Elevated liver enzymes.  . Metformin And Related Diarrhea    History   Social History  . Marital Status: Married    Spouse Name: N/A  .  Number of Children: N/A  . Years of Education: N/A   Occupational History  . Not on file.   Social History Main Topics  . Smoking status: Former Smoker -- 0.50 packs/day for 41 years    Types: Cigarettes    Quit date: 11/18/2014  . Smokeless tobacco: Never Used     Comment: currently stopped  . Alcohol Use: No  . Drug Use: No     Comment:  stopped 4 months ago (5/10/116)  . Sexual Activity: Not on file     Comment: declined condoms   Other Topics Concern  . Not on file   Social History Narrative   Pt lives with wife and grandkid in [place].   Has applied for disability  and is pending.   Worked in [employer] before about 3 years ago.           Review of Systems: General: negative for chills, fever, night sweats or weight changes.  Cardiovascular: negative for chest pain, dyspnea on exertion, edema, orthopnea, palpitations, paroxysmal nocturnal dyspnea or shortness of breath Dermatological: negative for rash Respiratory: negative for cough or wheezing Urologic: negative for hematuria Abdominal: negative for nausea, vomiting, diarrhea, bright red blood per rectum, melena, or hematemesis Neurologic: negative for visual changes, syncope, or dizziness All other systems reviewed and are otherwise negative except as noted above.    Blood pressure 112/60, pulse 72, height 6' (1.829 m), weight 220 lb (99.791 kg).  General appearance: alert and no distress Neck: no adenopathy, no carotid bruit, no JVD, supple, symmetrical, trachea midline and thyroid not enlarged, symmetric, no tenderness/mass/nodules Lungs: clear to auscultation bilaterally Heart: regular rate and rhythm, S1, S2 normal, no murmur, click, rub or gallop Extremities: 2+ right, absent left pedal pulses  EKG not performed today  ASSESSMENT AND PLAN:   Bilateral lower extremity pain Mr. Radoncic was referred to me by Dr. Amalia Hailey for peripheral vascular evaluation. He has multiple vascular factors include including greater than 100 pack years of tobacco abuse, treated diabetes, hypertension and hyperlipidemia. He has pain in both legs well standing. He denies claudication. He has no open wounds but this does have severe fungal disease of his nail beds bilaterally. He had lower extremity until Doppler studies in our office performed 11/30/14 revealing a right ABI of 1, left ABI 0.52 with an occluded left SFA and one-vessel runoff on that side. He has no open wounds. His discomfort is bilateral and not lifestyle limiting.  At this juncture, I see no benefit in addressing his left SFA disease but have  cautioned him to be extra vigilant with footcare especially on that side. I will see him back when necessary      Lorretta Harp MD [facility], [facility] 12/26/2014 9:47 AM

## 2014-12-26 NOTE — Assessment & Plan Note (Signed)
Jacob Joseph was referred to me by Dr. Amalia Hailey for peripheral vascular evaluation. He has multiple vascular factors include including greater than 100 pack years of tobacco abuse, treated diabetes, hypertension and hyperlipidemia. He has pain in both legs well standing. He denies claudication. He has no open wounds but this does have severe fungal disease of his nail beds bilaterally. He had lower extremity until Doppler studies in our office performed 11/30/14 revealing a right ABI of 1, left ABI 0.52 with an occluded left SFA and one-vessel runoff on that side. He has no open wounds. His discomfort is bilateral and not lifestyle limiting.  At this juncture, I see no benefit in addressing his left SFA disease but have cautioned him to be extra vigilant with footcare especially on that side. I will see him back when necessary

## 2014-12-26 NOTE — Patient Instructions (Signed)
Dr Gwenlyn Found will follow-up with you as needed.

## 2014-12-27 ENCOUNTER — Emergency Department (HOSPITAL_COMMUNITY)
Admission: EM | Admit: 2014-12-27 | Discharge: 2014-12-27 | Discharge status: Absent without leave | Payer: Medicare Other | Source: Home / Self Care

## 2014-12-27 ENCOUNTER — Encounter: Payer: Self-pay | Admitting: Internal Medicine

## 2014-12-27 ENCOUNTER — Ambulatory Visit (INDEPENDENT_AMBULATORY_CARE_PROVIDER_SITE_OTHER): Payer: Medicare Other | Admitting: Internal Medicine

## 2014-12-27 VITALS — BP 133/78 | HR 71 | Temp 98.3°F | Wt 224.4 lb

## 2014-12-27 DIAGNOSIS — F17211 Nicotine dependence, cigarettes, in remission: Secondary | ICD-10-CM | POA: Diagnosis not present

## 2014-12-27 DIAGNOSIS — E119 Type 2 diabetes mellitus without complications: Secondary | ICD-10-CM

## 2014-12-27 DIAGNOSIS — Z7982 Long term (current) use of aspirin: Secondary | ICD-10-CM

## 2014-12-27 DIAGNOSIS — I10 Essential (primary) hypertension: Secondary | ICD-10-CM | POA: Diagnosis not present

## 2014-12-27 DIAGNOSIS — F17200 Nicotine dependence, unspecified, uncomplicated: Secondary | ICD-10-CM

## 2014-12-27 DIAGNOSIS — R600 Localized edema: Secondary | ICD-10-CM

## 2014-12-27 DIAGNOSIS — R220 Localized swelling, mass and lump, head: Secondary | ICD-10-CM | POA: Insufficient documentation

## 2014-12-27 LAB — GLUCOSE, CAPILLARY: Glucose-Capillary: 130 mg/dL — ABNORMAL HIGH (ref 65–99)

## 2014-12-27 MED ORDER — AMLODIPINE BESYLATE 5 MG PO TABS: 5.0000 mg | ORAL_TABLET | Freq: Every day | ORAL | Status: DC

## 2014-12-27 NOTE — Assessment & Plan Note (Signed)
BP Readings from Last 3 Encounters:  12/27/14 133/78  12/26/14 112/60  12/06/14 112/69    Lab Results  Component Value Date   NA 137 11/21/2014   K 4.0 11/21/2014   CREATININE 0.99 11/21/2014    Assessment: Blood pressure control: fair   Progress toward BP goal: at goal     Plan:  - Patient's lisinopril was discontinued today in the context of facial swelling (thought to possibly be due to ACE-inhibitor induced angioedema). It will be replaced by amlodipine 5 mg daily - Continue to monitor on new BP regimen; if edema persists off of lisinopril, can discontinue amlodipine and place patient back on lisinopril at next visit

## 2014-12-27 NOTE — Patient Instructions (Signed)
Please STOP taking the lisinopril medication for your blood pressure. This medication has been known to cause swelling of the face/tongue/airway.  Please START taking amlodipine (sent to your pharmacy) for your blood pressure as a replacement.  IF the swelling gets worse, if you feel short of breath or if you feel like your tongue is swelling, please GO TO THE EMERGENCY DEPARTMENT  General Instructions:   Please bring your medicines with you each time you come to clinic.  Medicines may include prescription medications, over-the-counter medications, herbal remedies, eye drops, vitamins, or other pills.   Progress Toward Treatment Goals:  Treatment Goal 10/31/2014  Hemoglobin A1C deteriorated  Blood pressure unchanged  Stop smoking smoking the same amount  Prevent falls -    Self Care Goals & Plans:  Self Care Goal 12/27/2014  Manage my medications take my medicines as prescribed; bring my medications to every visit; refill my medications on time  Monitor my health bring my glucose meter and log to each visit; keep track of my blood pressure; keep track of my blood glucose  Eat healthy foods eat more vegetables; eat foods that are low in salt; eat baked foods instead of fried foods  Be physically active find an activity I enjoy; take a walk every day  Stop smoking -  Prevent falls -  Meeting treatment goals -    Home Blood Glucose Monitoring 10/31/2014  Check my blood sugar once a day  When to check my blood sugar before breakfast     Care Management & Community Referrals:  Referral 10/31/2014  Referrals made for care management support none needed  Referrals made to community resources -

## 2014-12-27 NOTE — Assessment & Plan Note (Addendum)
Patient noted upper lip edema last night. It is not accompanied by tongue swelling or shortness of breath. He notes no new exposures. He takes lisinopril and the edema could be ACE-inhibitor-induced. The fact that he is African-American and that he has concurrent aspirin therapy makes this more likely.  - Trial of stopping lisinopril in context of edema - Replace lisinopril with amlodipine 5 mg daily; amlodipine has also caused edema, but this is a rare side effect - Patient warned to return to ED with any increased swelling, shortness of breath or difficulty breathing

## 2014-12-27 NOTE — Assessment & Plan Note (Signed)
Patient has quit smoking.  - Congratulated him today - Continue to monitor

## 2014-12-27 NOTE — Progress Notes (Signed)
[facility] Subjective:   Patient ID: Jacob Joseph male   DOB: 21-Jul-1955 59 y.o.   MRN: [id number]  HPI: Mr.Harveer Star is a 59 y.o. male with a PMH of HIV (on HAART), HCV (s/p Harvoni), latent TB, depression, hypertension and DM2 who presents for lip swelling.   He first noticed this episode of swelling at 11PM last night. The swelling is in the middle of his upper lip. He has not had any new exposures or new food ingestions. He has not had any recent medication changes. He does not have seasonal or food allergies to his knowledge. Of note, he does take lisinopril. He denies difficulty swallowing or shortness of breath.  In April, he visited the ED for right-sided facial swelling. At that time, he was thought to have a blocked salivary gland; he was treated with Keflex and eventually, the swelling subsided.   Past Medical History  Diagnosis Date  . Hypertension   . HIV (human immunodeficiency virus infection)   . Depression   . Stroke 11/2011    Carotids Doppler negative. Right sided weakness resolved, initially on Plavix for 3 months and then continued with ASA.    . TB lung, latent 1988    Treated  . Calcium oxalate crystals in urine 12/23/2011    Asymptomatic, no hematuria. Advised to take plenty of water.  . Hepatitis C   . Diabetes mellitus without complication     pre diabetic  . Peripheral arterial disease     ooccluded left SFA by duplex ultrasound, tibial disease left greater than right   Current Outpatient Prescriptions  Medication Sig Dispense Refill  . ADVAIR DISKUS 100-50 MCG/DOSE AEPB Inhale 1 puff into the lungs 2 (two) times daily.  6  . amLODipine (NORVASC) 5 MG tablet Take 1 tablet (5 mg total) by mouth daily. 30 tablet 11  . aspirin EC 81 MG EC tablet Take 1 tablet (81 mg total) by mouth daily. 30 tablet 5  . citalopram (CELEXA) 20 MG tablet Take 20 mg by mouth daily.    [name] Kitchen elvitegravir-cobicistat-emtricitabine-tenofovir (GENVOYA)  150-150-200-10 MG TABS tablet Take 1 tablet by mouth daily with breakfast. 30 tablet 11  . glipiZIDE (GLUCOTROL) 5 MG tablet Take 1 tablet (5 mg total) by mouth daily before breakfast. 60 tablet 2  . hydroxypropyl methylcellulose / hypromellose (ISOPTO TEARS / GONIOVISC) 2.5 % ophthalmic solution Place 1 drop into both eyes as needed for dry eyes.    . hydrOXYzine (VISTARIL) 50 MG capsule Take 50 mg by mouth daily.    . metoprolol tartrate (LOPRESSOR) 25 MG tablet Take 1 tablet (25 mg total) by mouth 2 (two) times daily. 60 tablet 11  . pravastatin (PRAVACHOL) 40 MG tablet Take 1 tablet (40 mg total) by mouth daily. 30 tablet 5  . PREZISTA 800 MG tablet TAKE 1 TABLET BY MOUTH EVERY DAY WITH BREAKFAST 30 tablet 5  . QUEtiapine (SEROQUEL) 25 MG tablet Take 25 mg by mouth 2 (two) times daily.     . SEROQUEL XR 300 MG 24 hr tablet Take 300 mg by mouth at bedtime.    [name] Kitchen SPIRIVA HANDIHALER 18 MCG inhalation capsule INHALE THE CONTENTS OF 1 CASPSULE USING HANDIHALER EVERY DAY 30 capsule 12  . terbinafine (LAMISIL) 250 MG tablet Take 1 tablet (250 mg total) by mouth daily. 42 tablet 0  . valACYclovir (VALTREX) 500 MG tablet Take 1 tablet (500 mg total) by mouth 2 (two) times daily. (Patient taking differently: Take 500 mg  by mouth daily. ) 180 tablet 3  . zolpidem (AMBIEN) 10 MG tablet Take 10 mg by mouth at bedtime as needed. For sleep     No current facility-administered medications for this visit.   Family History  Problem Relation Age of Onset  . Hypertension Mother   . Hypertension Father   . Cancer Father   . Heart attack Mother   . Stroke Mother    History   Social History  . Marital Status: Married    Spouse Name: N/A  . Number of Children: N/A  . Years of Education: N/A   Social History Main Topics  . Smoking status: Former Smoker -- 0.50 packs/day for 41 years    Types: Cigarettes    Quit date: 11/18/2014  . Smokeless tobacco: Never Used     Comment: currently stopped  .  Alcohol Use: No  . Drug Use: No     Comment:  stopped 4 months ago (5/10/116)  . Sexual Activity: Not on file     Comment: declined condoms   Other Topics Concern  . None   Social History Narrative   Pt lives with wife and grandkid in [place].   Has applied for disability and is pending.   Worked in [employer] before about 3 years ago.         Review of Systems: General: no recent illness Skin: no rashes or lesions HEENT: no headaches or blurred vision, no difficulty swallowing, no lesions in mouth, poor dentition (wears dentures but retained some teeth, multiple dental caries)  Cardiac: no chest pain, no palpitations Respiratory: no shortness of breath GI: no abdominal pain or changes in BMs Urinary: no changes Msk: no joint pain or swelling Psychiatric: history of depression  Objective:  Physical Exam: Filed Vitals:   12/27/14 1447  BP: 133/78  Pulse: 71  Temp: 98.3 F (36.8 C)  TempSrc: Oral  Weight: 224 lb 6.4 oz (101.787 kg)  SpO2: 100%   Appearance: in NAD, sitting in examining room with wife HEENT: AT/[place], PERRL, EOMi, normal sized tongue, subtle swelling of upper lip is evident, dentures initially in place but when removed, reveal no blocked ducts, no lesions in mouth, dental caries in remaining teeth (worst in right lower palate) Heart: RRR, normal S1S2, no murmurs Lungs: CTAB, no wheezing, normal work of breathing Abdomen: BS+, soft, nontender Musculoskeletal: normal ROM, no swelling Neurologic: A&Ox3, grossly intact Skin: no rashes or lesions  Assessment & Plan:  Case discussed with Dr. Ellwood Dense  Swelling of upper lip Patient noted upper lip edema last night. It is not accompanied by tongue swelling or shortness of breath. He notes no new exposures. He takes lisinopril and the edema could be ACE-inhibitor-induced. The fact that he is African-American and that he has concurrent aspirin therapy makes this more likely.  - Trial of stopping lisinopril in  context of edema - Replace lisinopril with amlodipine 5 mg daily; amlodipine has also caused edema, but this is a rare side effect - Patient warned to return to ED with any increased swelling, shortness of breath or difficulty breathing  TOBACCO USER Patient has quit smoking.  - Congratulated him today - Continue to monitor  Essential hypertension BP Readings from Last 3 Encounters:  12/27/14 133/78  12/26/14 112/60  12/06/14 112/69    Lab Results  Component Value Date   NA 137 11/21/2014   K 4.0 11/21/2014   CREATININE 0.99 11/21/2014    Assessment: Blood pressure control: fair   Progress toward  BP goal: at goal     Plan:  - Patient's lisinopril was discontinued today in the context of facial swelling (thought to possibly be due to ACE-inhibitor induced angioedema). It will be replaced by amlodipine 5 mg daily - Continue to monitor on new BP regimen; if edema persists off of lisinopril, can discontinue amlodipine and place patient back on lisinopril at next visit     Medications Ordered Meds ordered this encounter  Medications  . DISCONTD: amLODipine (NORVASC) 5 MG tablet    Sig: Take 1 tablet (5 mg total) by mouth daily.    Dispense:  30 tablet    Refill:  11  . amLODipine (NORVASC) 5 MG tablet    Sig: Take 1 tablet (5 mg total) by mouth daily.    Dispense:  30 tablet    Refill:  11   Other Orders Orders Placed This Encounter  Procedures  . Glucose, capillary

## 2014-12-27 NOTE — Progress Notes (Signed)
Internal Medicine Clinic Attending  59 year old patient with lip swelling of unclear etiology. While we discussed that we can try stopping ACE inhibitor and substitute it with Amlodipine, literature reveals association between amlodipine, metoprolol, aspirin and other NSAIDs with angioedema. If this is angioedema or not, I am not sure. It does not have any other presenting feature other than a lip swelling.    Case discussed with Dr. Sherrine Maples at the time of the visit.  We reviewed the resident's history and exam and pertinent patient test results.  I agree with the assessment, diagnosis, and plan of care documented in the resident's note.

## 2015-01-02 ENCOUNTER — Ambulatory Visit (INDEPENDENT_AMBULATORY_CARE_PROVIDER_SITE_OTHER): Payer: Medicare Other | Admitting: Dietician

## 2015-01-02 ENCOUNTER — Other Ambulatory Visit (INDEPENDENT_AMBULATORY_CARE_PROVIDER_SITE_OTHER): Payer: Medicare Other | Admitting: Dietician

## 2015-01-02 VITALS — Wt 225.2 lb

## 2015-01-02 DIAGNOSIS — E119 Type 2 diabetes mellitus without complications: Secondary | ICD-10-CM

## 2015-01-02 DIAGNOSIS — Z79899 Other long term (current) drug therapy: Secondary | ICD-10-CM | POA: Diagnosis not present

## 2015-01-02 DIAGNOSIS — Z713 Dietary counseling and surveillance: Secondary | ICD-10-CM

## 2015-01-02 LAB — HM DIABETES EYE EXAM

## 2015-01-02 MED ORDER — ONETOUCH VERIO W/DEVICE KIT: 1.0000 | PACK | Freq: Every day | Status: DC | PRN

## 2015-01-02 NOTE — Progress Notes (Signed)
  Medical Nutrition Therapy:  Appt start time: 7262 end time:  0355.  Assessment:  Primary concerns today: diabetes meter and blood sugar control.  Mr. Ressler is accompanied today by friend who assists him in caring for his diabetes. She shops and cooks for them and is willing to adjust to what he needs. She says she is  Offered diabetes classes to them today. Preferred Learning Style:No preference indicated  Learning Readiness: Ready  MEDICATIONS: glipizide, diarrhea with metformin WEIGHT: 225.2# DIETARY INTAKE: Usual eating pattern includes 3 meals and 2-3 snacks per day. Everyday foods include fried foods, canned foods, chips.  Avoided foods: none mentioned today.   24 hours recall to be done at next visit Usual physical activity: ADLs  Estimated energy needs/day: 2250 calories ~ 250-275 g carbohydrates 60-75 per meal and 15-30 per snack   Progress Towards Goal(s):  In progress.   Nutritional Diagnosis:  NB-1.1 Food and nutrition-related knowledge deficit As related to lack of prior diabetes training.  As evidenced by his report and not notes in chart of same.    Intervention:  Nutrition education about his medicare diabetes benefits, meal planning for diabetes and self monitoring. Coordination of care- request testing supply rx Teaching Method Utilized: Visual,  Auditory and hands on Handouts given during visit include:Living with diabetes book, meter and isntructions Barriers to learning/adherence to lifestyle change: finances, comorbid illnesses  Demonstrated degree of understanding via:  Teach Back   Monitoring/Evaluation:  Dietary intake, exercise, meter, and body weight in 3 week(s).

## 2015-01-03 NOTE — Progress Notes (Signed)
Patient needs diabetes supplies for new meter he received yesterday. He called and says his pharmacy has the prescription for testing supplies and still needs rx for lancets

## 2015-01-07 ENCOUNTER — Encounter: Payer: Self-pay | Admitting: Dietician

## 2015-01-07 ENCOUNTER — Other Ambulatory Visit: Payer: Self-pay | Admitting: *Deleted

## 2015-01-07 DIAGNOSIS — E1110 Type 2 diabetes mellitus with ketoacidosis without coma: Secondary | ICD-10-CM

## 2015-01-07 MED ORDER — GLUCOSE BLOOD VI STRP: ORAL_STRIP | Status: DC

## 2015-01-08 ENCOUNTER — Other Ambulatory Visit: Payer: Self-pay | Admitting: Dietician

## 2015-01-08 DIAGNOSIS — E119 Type 2 diabetes mellitus without complications: Secondary | ICD-10-CM

## 2015-01-08 MED ORDER — ONETOUCH DELICA LANCETS FINE MISC: Status: DC

## 2015-01-08 NOTE — Telephone Encounter (Signed)
Patient called his pharmacy- they have his test strips ready, but do not have a prescription for lancets. Noted rx request was not routed to PCP. Reopened it and routed it to [facility] attending to be signed.

## 2015-01-10 DIAGNOSIS — F329 Major depressive disorder, single episode, unspecified: Secondary | ICD-10-CM | POA: Diagnosis not present

## 2015-01-15 ENCOUNTER — Ambulatory Visit: Payer: Medicare Other | Admitting: *Deleted

## 2015-01-16 ENCOUNTER — Other Ambulatory Visit: Payer: Self-pay | Admitting: Cardiovascular Disease

## 2015-01-17 NOTE — Telephone Encounter (Signed)
Rx(s) sent to pharmacy electronically.  

## 2015-01-18 ENCOUNTER — Ambulatory Visit: Payer: Medicare Other | Admitting: Internal Medicine

## 2015-01-23 ENCOUNTER — Encounter: Payer: Self-pay | Admitting: Dietician

## 2015-01-23 ENCOUNTER — Ambulatory Visit (INDEPENDENT_AMBULATORY_CARE_PROVIDER_SITE_OTHER): Payer: Medicare Other | Admitting: Dietician

## 2015-01-23 VITALS — Wt 222.8 lb

## 2015-01-23 DIAGNOSIS — Z713 Dietary counseling and surveillance: Secondary | ICD-10-CM | POA: Diagnosis not present

## 2015-01-23 DIAGNOSIS — Z8673 Personal history of transient ischemic attack (TIA), and cerebral infarction without residual deficits: Secondary | ICD-10-CM | POA: Diagnosis not present

## 2015-01-23 DIAGNOSIS — E119 Type 2 diabetes mellitus without complications: Secondary | ICD-10-CM

## 2015-01-23 NOTE — Progress Notes (Signed)
  Medical Nutrition Therapy:  Appt start time: 2979 end time:  1505.  Assessment:  Primary concerns today: blood sugar control, preventing another stroke.  Mr. Dannemiller is accompanied today by supportive friend and grand daughter.  He did not bring his meter, but reports  That his fastings were in 130s the week before least and in the 160s last week. He is also trying to quit smoking and eats hard candy often throughout the day. Does not like sugar-free candy.  Learning Readiness: Ready MEDICATIONS: glipizide, diarrhea with metformin, seroquel can also be related to weight gain, increased blood sugars WEIGHT: 222.8# DIETARY INTAKE: Usual eating pattern includes 3 meals and 2-3 snacks per day. Everyday foods include fried foods, canned foods, chips.  Avoided foods: sugar free candy.   Says he is eating more fruit, vegetables and baked foods now and was doing better when he had more meals that were smaller. His friend says she doesn't;t like to cook so is thin ing about buying him more foods he can fix himself like frozen meals or salads.   Usual physical activity: ADLs- they have applied for a gym membership  Estimated energy needs/day: 2250 calories ~ 250-275 g carbohydrates 60-75 per meal and 15-30 per snack   Progress Towards Goal(s):  In progress.   Nutritional Diagnosis:  NB-1.1 Food and nutrition-related knowledge deficit As related to lack of prior diabetes training improving As evidenced by his report and notes in chart of same.    Intervention:  Nutrition education about meal planning for diabetes and physical activity. Teaching Method Utilized: Visual,  Auditory and hands on Handouts given during visit include:diabetes meal planning Barriers to learning/adherence to lifestyle change: finances, comorbid illnesses  Demonstrated degree of understanding via:  Teach Back   Monitoring/Evaluation:  Dietary intake, exercise, meter, and body weight in 6 week(s).

## 2015-01-23 NOTE — Patient Instructions (Addendum)
Keep up the great work of caring for your diabetes!  Eating about the same amount of starch and sugar at meals and snacks should help your blood sugars. About 4 choices at each meal and 1 for snacks may work for you   Examples fo what to have at a meal                        :  2 starch + 1 fruit + 1 dairy = 4 choices    1 starch + 2 dairy + 1 fruit = 4  choices    2 starch + 2 milk = 4 choices    1 starch + 1 milk + 2 fruit = 4 choices    2 starch + 2 fruit = 4  choices    4 starches = 4  choices  Veggies, healthy fats like nuts and avocados  and meats do not count- add them as needed.   Exercise prescription- 150 minutes of exercise each week- can do every 3 days a week for 50 minutes or 5 days a week for 30 minutes. Start out with 5-61minutes and build up

## 2015-01-28 ENCOUNTER — Ambulatory Visit (HOSPITAL_COMMUNITY)
Admission: RE | Admit: 2015-01-28 | Discharge: 2015-01-28 | Disposition: A | Discharge status: Home / Self Care | Payer: Medicare Other | Source: Ambulatory Visit | Attending: Internal Medicine | Admitting: Internal Medicine

## 2015-01-28 ENCOUNTER — Ambulatory Visit (INDEPENDENT_AMBULATORY_CARE_PROVIDER_SITE_OTHER): Payer: Medicare Other | Admitting: Internal Medicine

## 2015-01-28 ENCOUNTER — Encounter: Payer: Self-pay | Admitting: Internal Medicine

## 2015-01-28 VITALS — BP 131/70 | HR 73 | Temp 98.2°F | Ht 72.0 in | Wt 229.3 lb

## 2015-01-28 DIAGNOSIS — I82541 Chronic embolism and thrombosis of right tibial vein: Secondary | ICD-10-CM | POA: Diagnosis not present

## 2015-01-28 DIAGNOSIS — E785 Hyperlipidemia, unspecified: Secondary | ICD-10-CM

## 2015-01-28 DIAGNOSIS — M79661 Pain in right lower leg: Secondary | ICD-10-CM

## 2015-01-28 DIAGNOSIS — E1140 Type 2 diabetes mellitus with diabetic neuropathy, unspecified: Secondary | ICD-10-CM

## 2015-01-28 DIAGNOSIS — I82531 Chronic embolism and thrombosis of right popliteal vein: Secondary | ICD-10-CM | POA: Insufficient documentation

## 2015-01-28 DIAGNOSIS — M7989 Other specified soft tissue disorders: Secondary | ICD-10-CM | POA: Diagnosis not present

## 2015-01-28 DIAGNOSIS — I824Z1 Acute embolism and thrombosis of unspecified deep veins of right distal lower extremity: Secondary | ICD-10-CM

## 2015-01-28 DIAGNOSIS — F17210 Nicotine dependence, cigarettes, uncomplicated: Secondary | ICD-10-CM | POA: Diagnosis not present

## 2015-01-28 DIAGNOSIS — I10 Essential (primary) hypertension: Secondary | ICD-10-CM | POA: Diagnosis not present

## 2015-01-28 DIAGNOSIS — Z7982 Long term (current) use of aspirin: Secondary | ICD-10-CM

## 2015-01-28 DIAGNOSIS — J449 Chronic obstructive pulmonary disease, unspecified: Secondary | ICD-10-CM | POA: Diagnosis not present

## 2015-01-28 DIAGNOSIS — Z86718 Personal history of other venous thrombosis and embolism: Secondary | ICD-10-CM

## 2015-01-28 DIAGNOSIS — E118 Type 2 diabetes mellitus with unspecified complications: Secondary | ICD-10-CM | POA: Diagnosis not present

## 2015-01-28 DIAGNOSIS — Z79899 Other long term (current) drug therapy: Secondary | ICD-10-CM

## 2015-01-28 DIAGNOSIS — F17200 Nicotine dependence, unspecified, uncomplicated: Secondary | ICD-10-CM

## 2015-01-28 LAB — COMPLETE METABOLIC PANEL WITH GFR
ALK PHOS: 72 U/L (ref 39–117)
ALT: 15 U/L (ref 0–53)
AST: 20 U/L (ref 0–37)
Albumin: 4.1 g/dL (ref 3.5–5.2)
BILIRUBIN TOTAL: 0.2 mg/dL (ref 0.2–1.2)
BUN: 12 mg/dL (ref 6–23)
CHLORIDE: 105 meq/L (ref 96–112)
CO2: 22 mEq/L (ref 19–32)
CREATININE: 0.99 mg/dL (ref 0.50–1.35)
Calcium: 9.7 mg/dL (ref 8.4–10.5)
GFR, Est African American: >89 mL/min
GFR, Est Non African American: 84 mL/min
Glucose, Bld: 117 mg/dL — ABNORMAL HIGH (ref 70–99)
Potassium: 4.4 mEq/L (ref 3.5–5.3)
Sodium: 139 mEq/L (ref 135–145)
Total Protein: 8.2 g/dL (ref 6.0–8.3)

## 2015-01-28 LAB — D-DIMER, QUANTITATIVE: D-Dimer, Quant: 0.55 ug/mL-FEU — ABNORMAL HIGH (ref 0.00–0.48)

## 2015-01-28 LAB — POCT GLYCOSYLATED HEMOGLOBIN (HGB A1C): Hemoglobin A1C: 7

## 2015-01-28 LAB — GLUCOSE, CAPILLARY: Glucose-Capillary: 112 mg/dL — ABNORMAL HIGH (ref 65–99)

## 2015-01-28 MED ORDER — GABAPENTIN 300 MG PO CAPS: ORAL_CAPSULE | ORAL | Status: DC

## 2015-01-28 MED ORDER — ADVAIR DISKUS 100-50 MCG/DOSE IN AEPB: 1.0000 | INHALATION_SPRAY | Freq: Every day | RESPIRATORY_TRACT | Status: DC

## 2015-01-28 MED ORDER — ALBUTEROL SULFATE HFA 108 (90 BASE) MCG/ACT IN AERS: 2.0000 | INHALATION_SPRAY | Freq: Four times a day (QID) | RESPIRATORY_TRACT | Status: DC | PRN

## 2015-01-28 NOTE — Assessment & Plan Note (Signed)
Pt has history of DVT in 2014 in [place], and was on coumadin for a year. Coumadin is stopped for 6 months now. He had complaints of right calf pain for 3 days, about 7/10, but able to bear weight. No edema of the leg   Due to his recent history of DVT, we ordered D-Dimer, which was high- 0.55. So we urgently scheduled venous duplex of LE this afternoon, and the results came back at 5:45 PM  stating no acute thromboembolism, so pt was sent home and was instructed to take tylenol/ibuprofen for pain as needed.

## 2015-01-28 NOTE — Assessment & Plan Note (Signed)
Well managed, continue current regimen

## 2015-01-28 NOTE — Patient Instructions (Addendum)
Thank you for your visit today.  We have ordered blood work to check your D-Dimer level, if it is high then we will need to do imaging as you have a history of blood clot. We will try to reserve a spot for you today here at the radiology clinic. If the results come back positive and they are unable to do imaging today, then you will need to go to the ER to do the ultrasound.  Please cut your advair dose in half to one puff once a day.   We have sent albuterol to your preferred pharmacy. We have sent gabapentin for your numbness- PLEASE TAKE ONE PILL OF GABAPENTIN ON FIRST DAY, ONE PILL TWICE A DAY ON 2ND DAY, THEN ONE PILL THREE TIMES A DAY AFTER THAT     Please follow up in 2 weeks with me, and to discuss your diabetes. We are not making any changes now to your diabetes medicines.  General Instructions:    Treatment Goals:  Goals (1 Years of Data) as of 01/28/15          As of Today 01/23/15 01/02/15 12/27/14 12/26/14     Blood Pressure   . Blood Pressure < 140/90  131/70   133/78 112/60     Result Component   . HEMOGLOBIN A1C < 7.0           Weight   . Weight < 215 lb (97.523 kg)  229 lb 4.8 oz (104.01 kg) 222 lb 12.8 oz (101.061 kg) 225 lb 3.2 oz (102.15 kg) 224 lb 6.4 oz (101.787 kg) 220 lb (99.791 kg)      Progress Toward Treatment Goals:  Treatment Goal 10/31/2014  Hemoglobin A1C deteriorated  Blood pressure unchanged  Stop smoking smoking the same amount  Prevent falls -    Self Care Goals & Plans:  Self Care Goal 12/27/2014  Manage my medications take my medicines as prescribed; bring my medications to every visit; refill my medications on time  Monitor my health bring my glucose meter and log to each visit; keep track of my blood pressure; keep track of my blood glucose  Eat healthy foods eat more vegetables; eat foods that are low in salt; eat baked foods instead of fried foods  Be physically active find an activity I enjoy; take a walk every day  Stop smoking -   Prevent falls -  Meeting treatment goals -    Home Blood Glucose Monitoring 10/31/2014  Check my blood sugar once a day  When to check my blood sugar before breakfast     Care Management & Community Referrals:  Referral 10/31/2014  Referrals made for care management support none needed  Referrals made to community resources -

## 2015-01-28 NOTE — Assessment & Plan Note (Signed)
Pt was told by someone to stop Advair as it has interaction with the genvoya.  After discussing with Dr. Ellwood Dense, we do not think advair will cause systemic side effects even being on genvoya. However, we have reduced the advair dose in half. Also pt will continue spiriva, and rescue albuterol.   Pt has 45 pack years of smoking history. He had recently quit smoking but he restarted to 3 cigarettes a day for some reason. I again emphasized him to quit smoking altogether and offered resources to help him quit, which he did not want at this moment, but he will discuss this at the next visit.

## 2015-01-28 NOTE — Progress Notes (Signed)
VASCULAR LAB PRELIMINARY  PRELIMINARY  PRELIMINARY  PRELIMINARY  Right lower extremity venous Doppler completed.    Preliminary report:  There is no acute DVT or SVT noted in the right lower extremity.  There is chronic DVT noted in the right popliteal and in a branch of the right posterior tibial vein.  Gracelyn Coventry, RVT 01/28/2015, 5:24 PM

## 2015-01-28 NOTE — Assessment & Plan Note (Addendum)
Pt is on 5 mg glipizide. Today his A1c was 7%.  We agreed to discuss this at the next visit in 2 weeks as here were a lot of things going on today, esp with ruling out DVT  Plan will be to most likely start a low dose metformin 500 mg once a day and see how he tolerates it, and cut back on glipizide to 2.5 mg.  He has "allergy" to metformin, but looking back at the chart, it was abruptly started at a high dose. A gradual start will be better tolerated by the patient as he told me he had diarrhea few years back.  His peripheral pulses were also weak and feet were cold, so we have ordered an ABI study.   For his diabetic neuropathy, I started him on gabapentin 300 mg  (300 1st day, 600 2nd day, and then 900 going forward).

## 2015-01-28 NOTE — Assessment & Plan Note (Signed)
Had quit, but smoking 3 cig a day. Advised him to quit smoking and offered resources

## 2015-01-28 NOTE — Progress Notes (Signed)
Patient ID: Jacob Joseph, male   DOB: [birth year], 59 y.o.   MRN: [id number]   Subjective:   Patient ID: Jacob Joseph male   DOB: 01-06-1956 59 y.o.   MRN: [id number]  HPI: [name] is a 59 y.o. who is here for a follow up from last month. His main concerns today are the right leg swelling he has been having for 3 days, Advair question, tingling and numbness in his feet, and diabetes and htn management. Also needs med refills.  He has notable PMH of HIV on HAART, T2DM, HTN, HCV followed by ID, moderate COPD with 45 pack year history, and recent history of DVT in 2015.  Last month he had some lip swelling thought to be due to lisinopril, so it was stopped, no episodes of lip swelling or other upper airway swelling since then He is compliant with his medicines and has experienced no side effects from amlodipine which was started last month.   Please see problem list for management and a&p.     Past Medical History  Diagnosis Date  . Hypertension   . HIV (human immunodeficiency virus infection)   . Depression   . Stroke 11/2011    Carotids Doppler negative. Right sided weakness resolved, initially on Plavix for 3 months and then continued with ASA.    . TB lung, latent 1988    Treated  . Calcium oxalate crystals in urine 12/23/2011    Asymptomatic, no hematuria. Advised to take plenty of water.  . Hepatitis C   . Diabetes mellitus without complication     pre diabetic  . Peripheral arterial disease     ooccluded left SFA by duplex ultrasound, tibial disease left greater than right   Current Outpatient Prescriptions  Medication Sig Dispense Refill  . amLODipine (NORVASC) 5 MG tablet Take 1 tablet (5 mg total) by mouth daily. 30 tablet 11  . aspirin EC 81 MG EC tablet Take 1 tablet (81 mg total) by mouth daily. 30 tablet 5  . Blood Glucose Monitoring Suppl (ONETOUCH VERIO) W/DEVICE KIT 1 each by Does not apply route daily as needed. 1 kit 0  . citalopram (CELEXA) 20 MG  tablet Take 20 mg by mouth daily.    [name] Kitchen elvitegravir-cobicistat-emtricitabine-tenofovir (GENVOYA) 150-150-200-10 MG TABS tablet Take 1 tablet by mouth daily with breakfast. 30 tablet 11  . glipiZIDE (GLUCOTROL) 5 MG tablet Take 1 tablet (5 mg total) by mouth daily before breakfast. 60 tablet 2  . glucose blood (ONETOUCH VERIO) test strip Use as instructed 100 each 12  . hydroxypropyl methylcellulose / hypromellose (ISOPTO TEARS / GONIOVISC) 2.5 % ophthalmic solution Place 1 drop into both eyes as needed for dry eyes.    . hydrOXYzine (VISTARIL) 50 MG capsule Take 50 mg by mouth daily.    . metoprolol tartrate (LOPRESSOR) 25 MG tablet TAKE ONE TABLET BY MOUTH TWICE DAILY 60 tablet 11  . ONETOUCH DELICA LANCETS FINE MISC Check blood sugar one time day before breakfast 100 each 5  . pravastatin (PRAVACHOL) 40 MG tablet Take 1 tablet (40 mg total) by mouth daily. 30 tablet 5  . PREZISTA 800 MG tablet TAKE 1 TABLET BY MOUTH EVERY DAY WITH BREAKFAST 30 tablet 5  . QUEtiapine (SEROQUEL) 25 MG tablet Take 25 mg by mouth 2 (two) times daily.     . SEROQUEL XR 300 MG 24 hr tablet Take 300 mg by mouth at bedtime.    [name] Kitchen SPIRIVA HANDIHALER 18 MCG inhalation capsule INHALE THE CONTENTS  OF 1 CASPSULE USING HANDIHALER EVERY DAY 30 capsule 12  . terbinafine (LAMISIL) 250 MG tablet Take 1 tablet (250 mg total) by mouth daily. 42 tablet 0  . valACYclovir (VALTREX) 500 MG tablet Take 1 tablet (500 mg total) by mouth 2 (two) times daily. (Patient taking differently: Take 500 mg by mouth daily. ) 180 tablet 3  . ADVAIR DISKUS 100-50 MCG/DOSE AEPB Inhale 1 puff into the lungs 2 (two) times daily.  6  . zolpidem (AMBIEN) 10 MG tablet Take 10 mg by mouth at bedtime as needed. For sleep     No current facility-administered medications for this visit.   Family History  Problem Relation Age of Onset  . Hypertension Mother   . Hypertension Father   . Cancer Father   . Heart attack Mother   . Stroke Mother    History    Social History  . Marital Status: Married    Spouse Name: N/A  . Number of Children: N/A  . Years of Education: N/A   Social History Main Topics  . Smoking status: Former Smoker -- 0.50 packs/day for 41 years    Types: Cigarettes    Quit date: 11/18/2014  . Smokeless tobacco: Never Used     Comment: currently stopped  . Alcohol Use: No  . Drug Use: No     Comment:  stopped 4 months ago (5/10/116)  . Sexual Activity: Not on file     Comment: declined condoms   Other Topics Concern  . Not on file   Social History Narrative   Pt lives with wife and grandkid in [place].   Has applied for disability and is pending.   Worked in [place] before about 3 years ago.         Review of Systems: Review of Systems  Constitutional: Negative.  Negative for malaise/fatigue.  HENT: Negative.   Respiratory: Negative for cough, shortness of breath and wheezing.   Cardiovascular: Positive for leg swelling. Negative for chest pain and palpitations.       Rt calf swelling for 3 days  Gastrointestinal: Negative.   Musculoskeletal: Negative for falls.  Skin: Negative for rash.  Neurological: Negative for weakness.  Psychiatric/Behavioral: Negative for depression.      Objective:  Physical Exam: Filed Vitals:   01/28/15 1351  BP: 131/70  Pulse: 73  Temp: 98.2 F (36.8 C)  TempSrc: Oral  Height: 6' (1.829 m)  Weight: 229 lb 4.8 oz (104.01 kg)  SpO2: 98%   Physical Exam  Constitutional: He is oriented to person, place, and time. He appears well-developed and well-nourished.  Neck: Normal range of motion. Neck supple.  Cardiovascular: Normal rate, regular rhythm and normal heart sounds.   No murmur heard. Pulses:      Carotid pulses are 3+ on the right side, and 3+ on the left side.      Popliteal pulses are 3+ on the right side, and 3+ on the left side.       Dorsalis pedis pulses are 1+ on the right side, and 1+ on the left side.       Posterior tibial pulses are 1+ on  the right side, and 1+ on the left side.  Pulmonary/Chest: Effort normal and breath sounds normal.  Musculoskeletal:       Right foot: There is normal range of motion.       Left foot: There is normal range of motion.  MSK: Tenderness to palpation on right calf, both calf measured  and were equal in diameter, no erythema or redness .  Popliteal pulse intact    Feet:  Right Foot:  Protective Sensation: 5 sites tested.5 sites sensed. Skin Integrity: Positive for callus and dry skin. Negative for ulcer or warmth.  Left Foot:  Protective Sensation: 5 sites tested.  Skin Integrity: Positive for callus. Negative for ulcer or warmth.  Neurological: He is alert and oriented to person, place, and time. No cranial nerve deficit.  Peripheral pulses- weak- both dorsalis pedis and posterior tibial, both feet were cold to touch   Tingling on both feet  Skin: Skin is dry. No cyanosis. Nails show no clubbing.  Psychiatric: He has a normal mood and affect.  Nursing note and vitals reviewed.     Assessment & Plan:   Please see problem based chart for assessment and plan

## 2015-01-28 NOTE — Progress Notes (Signed)
Internal Medicine Clinic Attending  I saw and evaluated the patient.  I personally confirmed the key portions of the history and exam documented by Dr. Tiburcio Pea and I reviewed pertinent patient test results.  The assessment, diagnosis, and plan were formulated together and I agree with the documentation in the resident's note.  59 year old patient with history of right leg DVT coming in with leg pain. Was on anticoagulation with coumadin, now stopped. Comes in with right leg swelling and pain. Exam - mild right sided calf swelling, calf tenderness but no cord palpated, cool toes bilaterally, TP could not be felt bilaterally. D-dimer, followed by right doppler to rule out DVT if Ddimer is high. If DDimer is low, this could be post DVT phlebitic changes. ABI, CMP to be added to the work up.  Jacob Fireman MD MPH 01/28/2015 3:40 PM

## 2015-02-06 ENCOUNTER — Other Ambulatory Visit: Payer: Medicare Other

## 2015-02-06 DIAGNOSIS — B182 Chronic viral hepatitis C: Secondary | ICD-10-CM

## 2015-02-07 ENCOUNTER — Encounter: Payer: Medicare Other | Admitting: Cardiovascular Disease

## 2015-02-07 ENCOUNTER — Encounter: Payer: Self-pay | Admitting: Cardiovascular Disease

## 2015-02-07 LAB — HEPATITIS C RNA QUANTITATIVE: HCV Quantitative: NOT DETECTED IU/mL (ref <15)

## 2015-02-07 NOTE — Patient Instructions (Signed)
Follow up with Dr Berry as needed.  

## 2015-02-07 NOTE — Progress Notes (Signed)
This encounter was created in error - please disregard.

## 2015-02-07 NOTE — Progress Notes (Deleted)
   Patient ID: Jacob Joseph, male    DOB: [birth year], 59 y.o.   MRN: [id number]  HPI    Review of Systems    Physical Exam

## 2015-02-11 ENCOUNTER — Ambulatory Visit (INDEPENDENT_AMBULATORY_CARE_PROVIDER_SITE_OTHER): Payer: Medicare Other | Admitting: Internal Medicine

## 2015-02-11 ENCOUNTER — Encounter: Payer: Self-pay | Admitting: Internal Medicine

## 2015-02-11 VITALS — BP 122/71 | HR 70 | Temp 98.1°F | Ht 72.0 in | Wt 227.6 lb

## 2015-02-11 DIAGNOSIS — E1122 Type 2 diabetes mellitus with diabetic chronic kidney disease: Secondary | ICD-10-CM

## 2015-02-11 DIAGNOSIS — M5137 Other intervertebral disc degeneration, lumbosacral region: Secondary | ICD-10-CM

## 2015-02-11 DIAGNOSIS — Z7982 Long term (current) use of aspirin: Secondary | ICD-10-CM | POA: Diagnosis not present

## 2015-02-11 DIAGNOSIS — N189 Chronic kidney disease, unspecified: Secondary | ICD-10-CM

## 2015-02-11 DIAGNOSIS — M545 Low back pain: Secondary | ICD-10-CM

## 2015-02-11 DIAGNOSIS — I10 Essential (primary) hypertension: Secondary | ICD-10-CM

## 2015-02-11 DIAGNOSIS — E785 Hyperlipidemia, unspecified: Secondary | ICD-10-CM | POA: Diagnosis not present

## 2015-02-11 DIAGNOSIS — E1140 Type 2 diabetes mellitus with diabetic neuropathy, unspecified: Secondary | ICD-10-CM | POA: Diagnosis present

## 2015-02-11 DIAGNOSIS — F17210 Nicotine dependence, cigarettes, uncomplicated: Secondary | ICD-10-CM | POA: Diagnosis not present

## 2015-02-11 DIAGNOSIS — I129 Hypertensive chronic kidney disease with stage 1 through stage 4 chronic kidney disease, or unspecified chronic kidney disease: Secondary | ICD-10-CM | POA: Diagnosis not present

## 2015-02-11 DIAGNOSIS — Z79899 Other long term (current) drug therapy: Secondary | ICD-10-CM | POA: Diagnosis not present

## 2015-02-11 DIAGNOSIS — E118 Type 2 diabetes mellitus with unspecified complications: Secondary | ICD-10-CM

## 2015-02-11 MED ORDER — CYCLOBENZAPRINE HCL 5 MG PO TABS: 5.0000 mg | ORAL_TABLET | Freq: Two times a day (BID) | ORAL | Status: AC | PRN

## 2015-02-11 MED ORDER — METFORMIN HCL 500 MG PO TABS: 500.0000 mg | ORAL_TABLET | Freq: Every day | ORAL | Status: DC

## 2015-02-11 NOTE — Assessment & Plan Note (Addendum)
Pts's last A1c 2 weeks back was 7%. Pt brought the blood sugar log and his average # was in 120s, has not experienced any lows. His neuropathy is very well controlled on the gabapentin which was started last time.   We started him on low dose metformin 500 mg once a day We mentioned him that he continues to have good glycemic control we will decrease his glipizide to 2.5, and eventually may just stop it. He does not have a true allergy to metformin- in the past he was on a high dose metformin and had experienced some diarrhea probably because he was abruptly started on a high dose.   We will follow up in a month and re-assess. ABI study pending- ordered last time

## 2015-02-11 NOTE — Assessment & Plan Note (Signed)
BP Readings from Last 3 Encounters:  02/11/15 122/71  02/07/15 108/78  01/28/15 131/70  well controlled, continue current regimen

## 2015-02-11 NOTE — Assessment & Plan Note (Addendum)
He has a history of chronic back pain- see 'overview' His back exam was entirely unremarkable except some paraspinal muscle tenderness. No red flags, no neurological symptoms, no night time back pain, back exam negative, no fevers, no constitutional symptoms  Furthermore, he is on tenofovir which is also nephrotoxic so we did not put him on NSAIDs.  Given his CKD, we have given him flexeril for 2 weeks, and will re-assess in a month Pt was not really interested in PT

## 2015-02-11 NOTE — Patient Instructions (Addendum)
Thank you for your visit today.  Please start the metformin medicine daily in the AM.  Please use flexeril for your back pain for 2 weeks, or shorter if it resolves.  Please come back in 4 weeks  Back Pain, Adult Low back pain is very common. About 1 in 5 people have back pain.The cause of low back pain is rarely dangerous. The pain often gets better over time.About half of people with a sudden onset of back pain feel better in just 2 weeks. About 8 in 10 people feel better by 6 weeks.  CAUSES Some common causes of back pain include:  Strain of the muscles or ligaments supporting the spine.  Wear and tear (degeneration) of the spinal discs.  Arthritis.  Direct injury to the back. DIAGNOSIS Most of the time, the direct cause of low back pain is not known.However, back pain can be treated effectively even when the exact cause of the pain is unknown.Answering your caregiver's questions about your overall health and symptoms is one of the most accurate ways to make sure the cause of your pain is not dangerous. If your caregiver needs more information, he or she may order lab work or imaging tests (X-rays or MRIs).However, even if imaging tests show changes in your back, this usually does not require surgery. HOME CARE INSTRUCTIONS For many people, back pain returns.Since low back pain is rarely dangerous, it is often a condition that people can learn to [facility] their own.   Remain active. It is stressful on the back to sit or stand in one place. Do not sit, drive, or stand in one place for more than 30 minutes at a time. Take short walks on level surfaces as soon as pain allows.Try to increase the length of time you walk each day.  Do not stay in bed.Resting more than 1 or 2 days can delay your recovery.  Do not avoid exercise or work.Your body is made to move.It is not dangerous to be active, even though your back may hurt.Your back will likely heal faster if you return to  being active before your pain is gone.  Pay attention to your body when you bend and lift. Many people have less discomfortwhen lifting if they bend their knees, keep the load close to their bodies,and avoid twisting. Often, the most comfortable positions are those that put less stress on your recovering back.  Find a comfortable position to sleep. Use a firm mattress and lie on your side with your knees slightly bent. If you lie on your back, put a pillow under your knees.  Only take over-the-counter or prescription medicines as directed by your caregiver. Over-the-counter medicines to reduce pain and inflammation are often the most helpful.Your caregiver may prescribe muscle relaxant drugs.These medicines help dull your pain so you can more quickly return to your normal activities and healthy exercise.  Put ice on the injured area.  Put ice in a plastic bag.  Place a towel between your skin and the bag.  Leave the ice on for 15-20 minutes, 03-04 times a day for the first 2 to 3 days. After that, ice and heat may be alternated to reduce pain and spasms.  Ask your caregiver about trying back exercises and gentle massage. This may be of some benefit.  Avoid feeling anxious or stressed.Stress increases muscle tension and can worsen back pain.It is important to recognize when you are anxious or stressed and learn ways to manage it.Exercise is a great option.  SEEK MEDICAL CARE IF:  You have pain that is not relieved with rest or medicine.  You have pain that does not improve in 1 week.  You have new symptoms.  You are generally not feeling well. SEEK IMMEDIATE MEDICAL CARE IF:   You have pain that radiates from your back into your legs.  You develop new bowel or bladder control problems.  You have unusual weakness or numbness in your arms or legs.  You develop nausea or vomiting.  You develop abdominal pain.  You feel faint. Document Released: 07/06/2005 Document Revised:  01/05/2012 Document Reviewed: 11/07/2013 [facility] Patient Information 2015 [place], [place]. This information is not intended to replace advice given to you by your health care provider. Make sure you discuss any questions you have with your health care provider.   Metformin tablets What is this medicine? METFORMIN (met FOR min) is used to treat type 2 diabetes. It helps to control blood sugar. Treatment is combined with diet and exercise. This medicine can be used alone or with other medicines for diabetes. This medicine may be used for other purposes; ask your health care provider or pharmacist if you have questions. COMMON BRAND NAME(S): Glucophage What should I tell my health care provider before I take this medicine? They need to know if you have any of these conditions: -anemia -frequently drink alcohol-containing beverages -become easily dehydrated -heart attack -heart failure that is treated with medications -kidney disease -liver disease -polycystic ovary syndrome -serious infection or injury -vomiting -an unusual or allergic reaction to metformin, other medicines, foods, dyes, or preservatives -pregnant or trying to get pregnant -breast-feeding How should I use this medicine? Take this medicine by mouth. Take it with meals. Swallow the tablets with a drink of water. Follow the directions on the prescription label. Take your medicine at regular intervals. Do not take your medicine more often than directed. Talk to your pediatrician regarding the use of this medicine in children. While this drug may be prescribed for children as young as 37 years of age for selected conditions, precautions do apply. Overdosage: If you think you have taken too much of this medicine contact a poison control center or emergency room at once. NOTE: This medicine is only for you. Do not share this medicine with others. What if I miss a dose? If you miss a dose, take it as soon as you can. If it is  almost time for your next dose, take only that dose. Do not take double or extra doses. What may interact with this medicine? Do not take this medicine with any of the following medications: -dofetilide -gatifloxacin -certain contrast medicines given before X-rays, CT scans, MRI, or other procedures This medicine may also interact with the following medications: -digoxin -diuretics -male hormones, like estrogens or progestins and birth control pills -isoniazid -medicines for blood pressure, heart disease, irregular heart beat -morphine -nicotinic acid -phenothiazines like chlorpromazine, mesoridazine, prochlorperazine, thioridazine -phenytoin -procainamide -quinidine -quinine -ranitidine -steroid medicines like prednisone or cortisone -stimulant medicines for attention disorders, weight loss, or to stay awake -thyroid medicines -trimethoprim -vancomycin This list may not describe all possible interactions. Give your health care provider a list of all the medicines, herbs, non-prescription drugs, or dietary supplements you use. Also tell them if you smoke, drink alcohol, or use illegal drugs. Some items may interact with your medicine. What should I watch for while using this medicine? Visit your doctor or health care professional for regular checks on your progress. A test called the  HbA1C (A1C) will be monitored. This is a simple blood test. It measures your blood sugar control over the last 2 to 3 months. You will receive this test every 3 to 6 months. Learn how to check your blood sugar. Learn the symptoms of low and high blood sugar and how to manage them. Always carry a quick-source of sugar with you in case you have symptoms of low blood sugar. Examples include hard sugar candy or glucose tablets. Make sure others know that you can choke if you eat or drink when you develop serious symptoms of low blood sugar, such as seizures or unconsciousness. They must get medical help at  once. Tell your doctor or health care professional if you have high blood sugar. You might need to change the dose of your medicine. If you are sick or exercising more than usual, you might need to change the dose of your medicine. Do not skip meals. Ask your doctor or health care professional if you should avoid alcohol. Many nonprescription cough and cold products contain sugar or alcohol. These can affect blood sugar. This medicine may cause ovulation in premenopausal women who do not have regular monthly periods. This may increase your chances of becoming pregnant. You should not take this medicine if you become pregnant or think you may be pregnant. Talk with your doctor or health care professional about your birth control options while taking this medicine. Contact your doctor or health care professional right away if think you are pregnant. If you are going to need surgery, a MRI, CT scan, or other procedure, tell your doctor that you are taking this medicine. You may need to stop taking this medicine before the procedure. Wear a medical ID bracelet or chain, and carry a card that describes your disease and details of your medicine and dosage times. What side effects may I notice from receiving this medicine? Side effects that you should report to your doctor or health care professional as soon as possible: -allergic reactions like skin rash, itching or hives, swelling of the face, lips, or tongue -breathing problems -feeling faint or lightheaded, falls -muscle aches or pains -signs and symptoms of low blood sugar such as feeling anxious, confusion, dizziness, increased hunger, unusually weak or tired, sweating, shakiness, cold, irritable, headache, blurred vision, fast heartbeat, loss of consciousness -slow or irregular heartbeat -unusual stomach pain or discomfort -unusually tired or weak Side effects that usually do not require medical attention (report to your doctor or health care  professional if they continue or are bothersome): -diarrhea -headache -heartburn -metallic taste in mouth -nausea -stomach gas, upset This list may not describe all possible side effects. Call your doctor for medical advice about side effects. You may report side effects to FDA at 1-800-FDA-1088. Where should I keep my medicine? Keep out of the reach of children. Store at room temperature between 15 and 30 degrees C (59 and 86 degrees F). Protect from moisture and light. Throw away any unused medicine after the expiration date. NOTE: This sheet is a summary. It may not cover all possible information. If you have questions about this medicine, talk to your doctor, pharmacist, or health care provider.  2015, [employer]. (2012-10-18 16:03:44)

## 2015-02-11 NOTE — Progress Notes (Signed)
Internal Medicine Clinic Attending  I saw and evaluated the patient.  I personally confirmed the key portions of the history and exam documented by Dr. Saraiya and I reviewed pertinent patient test results.  The assessment, diagnosis, and plan were formulated together and I agree with the documentation in the resident's note.  

## 2015-02-11 NOTE — Progress Notes (Signed)
Patient ID: Jacob Joseph, male   DOB: [birth year], 59 y.o.   MRN: [id number]    Subjective:   Patient ID: Jacob Joseph male   DOB: 12/27/1955 59 y.o.   MRN: [id number]  HPI: [name] is a 59 y.o. who is here for a follow up from 2 weeks back.  His main concern today is the back pain. It has been there for few years, but he recently noticed it for a month- radiates to left knee. No red flags or neurological symptoms, no weight loss, night time back pain, or night sweats, no fevers.   He has notable PMH of HIV on HAART, T2DM, HTN, HCV followed by ID, moderate COPD with 45 pack year history, and recent history of DVT in 2015.  He is compliant with his meds.  Please see problem list for management and a&p.     Past Medical History  Diagnosis Date  . Hypertension   . HIV (human immunodeficiency virus infection)   . Depression   . Stroke 11/2011    Carotids Doppler negative. Right sided weakness resolved, initially on Plavix for 3 months and then continued with ASA.    . TB lung, latent 1988    Treated  . Calcium oxalate crystals in urine 12/23/2011    Asymptomatic, no hematuria. Advised to take plenty of water.  . Hepatitis C   . Diabetes mellitus without complication     pre diabetic  . Peripheral arterial disease     ooccluded left SFA by duplex ultrasound, tibial disease left greater than right   Current Outpatient Prescriptions  Medication Sig Dispense Refill  . ADVAIR DISKUS 100-50 MCG/DOSE AEPB Inhale 1 puff into the lungs daily. 60 each 6  . albuterol (PROVENTIL HFA;VENTOLIN HFA) 108 (90 BASE) MCG/ACT inhaler Inhale 2 puffs into the lungs every 6 (six) hours as needed for wheezing or shortness of breath. Do not take every day, take only if needed 1 Inhaler 2  . amLODipine (NORVASC) 5 MG tablet Take 1 tablet (5 mg total) by mouth daily. 30 tablet 11  . aspirin EC 81 MG EC tablet Take 1 tablet (81 mg total) by mouth daily. 30 tablet 5  . Blood Glucose Monitoring  Suppl (ONETOUCH VERIO) W/DEVICE KIT 1 each by Does not apply route daily as needed. 1 kit 0  . citalopram (CELEXA) 20 MG tablet Take 20 mg by mouth daily.    . cyclobenzaprine (FLEXERIL) 5 MG tablet Take 1 tablet (5 mg total) by mouth every 12 (twelve) hours as needed for muscle spasms. 30 tablet 0  . elvitegravir-cobicistat-emtricitabine-tenofovir (GENVOYA) 150-150-200-10 MG TABS tablet Take 1 tablet by mouth daily with breakfast. 30 tablet 11  . gabapentin (NEURONTIN) 300 MG capsule Take One pill on 1st day, then take one pill twice a day on 2nd day , then take one pill three times a day 90 capsule 2  . glipiZIDE (GLUCOTROL) 5 MG tablet Take 1 tablet (5 mg total) by mouth daily before breakfast. 60 tablet 2  . glucose blood (ONETOUCH VERIO) test strip Use as instructed 100 each 12  . hydroxypropyl methylcellulose / hypromellose (ISOPTO TEARS / GONIOVISC) 2.5 % ophthalmic solution Place 1 drop into both eyes as needed for dry eyes.    . hydrOXYzine (VISTARIL) 50 MG capsule Take 50 mg by mouth daily.    . metFORMIN (GLUCOPHAGE) 500 MG tablet Take 1 tablet (500 mg total) by mouth daily with breakfast. 30 tablet 11  . metoprolol tartrate (LOPRESSOR) 25 MG  tablet TAKE ONE TABLET BY MOUTH TWICE DAILY 60 tablet 11  . ONETOUCH DELICA LANCETS FINE MISC Check blood sugar one time day before breakfast 100 each 5  . pravastatin (PRAVACHOL) 40 MG tablet Take 1 tablet (40 mg total) by mouth daily. 30 tablet 5  . PREZISTA 800 MG tablet TAKE 1 TABLET BY MOUTH EVERY DAY WITH BREAKFAST 30 tablet 5  . QUEtiapine (SEROQUEL) 25 MG tablet Take 25 mg by mouth 2 (two) times daily.     . SEROQUEL XR 300 MG 24 hr tablet Take 300 mg by mouth at bedtime.    [name] Kitchen SPIRIVA HANDIHALER 18 MCG inhalation capsule INHALE THE CONTENTS OF 1 CASPSULE USING HANDIHALER EVERY DAY 30 capsule 12   No current facility-administered medications for this visit.   Family History  Problem Relation Age of Onset  . Hypertension Mother   .  Hypertension Father   . Cancer Father   . Heart attack Mother   . Stroke Mother    History   Social History  . Marital Status: Married    Spouse Name: N/A  . Number of Children: N/A  . Years of Education: N/A   Social History Main Topics  . Smoking status: Current Every Day Smoker -- 0.50 packs/day for 41 years    Types: Cigarettes    Last Attempt to Quit: 11/18/2014  . Smokeless tobacco: Never Used     Comment: currently stopped  . Alcohol Use: No  . Drug Use: No     Comment:  stopped 4 months ago (5/10/116)  . Sexual Activity: Not on file     Comment: declined condoms   Other Topics Concern  . None   Social History Narrative   Pt lives with wife and grandkid in [place].   Has applied for disability and is pending.   Worked in [place] before about 3 years ago.         Review of Systems: Review of Systems  Constitutional: Negative.  Negative for fever, chills, weight loss, malaise/fatigue and diaphoresis.  HENT: Negative.   Respiratory: Negative.  Negative for cough, shortness of breath and wheezing.   Cardiovascular: Negative.  Negative for chest pain, palpitations, leg swelling and PND.  Musculoskeletal: Positive for back pain. Negative for myalgias, falls and neck pain.  Neurological: Negative for weakness.  All other systems reviewed and are negative.   Objective:  Physical Exam: Filed Vitals:   02/11/15 1431  BP: 122/71  Pulse: 70  Temp: 98.1 F (36.7 C)  TempSrc: Oral  Height: 6' (1.829 m)  Weight: 227 lb 9.6 oz (103.239 kg)  SpO2: 100%   Physical Exam  Constitutional: He is oriented to person, place, and time. He appears well-developed and well-nourished.  Neck: Normal range of motion. Neck supple. No thyromegaly present.  Cardiovascular: Normal rate, regular rhythm, normal heart sounds and intact distal pulses.   No murmur heard. Pulmonary/Chest: Breath sounds normal. No respiratory distress. He has no wheezes.  Musculoskeletal: Normal  range of motion. He exhibits no tenderness.  Left paraspinal muscles some tenderness to palpation,  No pain when flexing and extending the back   Neurological: He is alert and oriented to person, place, and time. No cranial nerve deficit. Coordination normal.     Assessment & Plan:   Please see problem based charting for assessment and plan

## 2015-02-11 NOTE — Assessment & Plan Note (Signed)
He was restarted on statin. CMP last visit was unremarkable with no end organ liver damage- normal AST,ALT

## 2015-02-26 ENCOUNTER — Ambulatory Visit (INDEPENDENT_AMBULATORY_CARE_PROVIDER_SITE_OTHER): Payer: Medicare Other | Admitting: Internal Medicine

## 2015-02-26 ENCOUNTER — Encounter: Payer: Self-pay | Admitting: Internal Medicine

## 2015-02-26 VITALS — BP 128/84 | HR 65 | Temp 97.6°F | Wt 231.5 lb

## 2015-02-26 DIAGNOSIS — I639 Cerebral infarction, unspecified: Secondary | ICD-10-CM | POA: Diagnosis not present

## 2015-02-26 DIAGNOSIS — Z79899 Other long term (current) drug therapy: Secondary | ICD-10-CM | POA: Diagnosis not present

## 2015-02-26 DIAGNOSIS — F17200 Nicotine dependence, unspecified, uncomplicated: Secondary | ICD-10-CM

## 2015-02-26 DIAGNOSIS — Z23 Encounter for immunization: Secondary | ICD-10-CM | POA: Diagnosis present

## 2015-02-26 DIAGNOSIS — B20 Human immunodeficiency virus [HIV] disease: Secondary | ICD-10-CM | POA: Diagnosis not present

## 2015-02-26 DIAGNOSIS — F1910 Other psychoactive substance abuse, uncomplicated: Secondary | ICD-10-CM

## 2015-02-26 DIAGNOSIS — B182 Chronic viral hepatitis C: Secondary | ICD-10-CM

## 2015-02-26 DIAGNOSIS — B1710 Acute hepatitis C without hepatic coma: Secondary | ICD-10-CM | POA: Diagnosis not present

## 2015-02-26 DIAGNOSIS — Z720 Tobacco use: Secondary | ICD-10-CM | POA: Diagnosis not present

## 2015-02-26 NOTE — Progress Notes (Signed)
Patient Active Problem List   Diagnosis Date Noted  . Human immunodeficiency virus (HIV) disease 04/27/2006    Priority: High  . Chronic hepatitis C without hepatic coma 04/27/2006    Priority: High  . Polysubstance abuse 04/27/2006    Priority: High  . Hyperlipidemia 01/28/2015  . Swelling of upper lip 12/27/2014  . CVA (cerebral infarction) 12/09/2014  . Embolic cerebral infarction   . Faintness   . Syncope 11/19/2014  . Nail fungus 09/26/2014  . Genital herpes 08/29/2014  . Cataracts, bilateral 08/29/2014  . Tachycardia 11/30/2013  . Diabetes mellitus, type 2 10/19/2013  . Sinus tachycardia 10/19/2013  . Lipodystrophy 10/19/2013  . COPD, moderate 05/31/2013  . Bilateral lower extremity pain 05/08/2013  . Loss of weight 04/24/2013  . DVT, lower extremity 04/18/2013  . Complex Lt Renal Cyst 03/22/2013  . Preventive measure 05/30/2012  . Normocytic anemia 03/08/2012  . Leukopenia 03/08/2012  . CKD Stage 2 03/08/2012  . ERECTILE DYSFUNCTION 04/25/2008  . INSOMNIA, CHRONIC 06/14/2007  . TOBACCO USER 04/27/2006  . Major depressive disorder, recurrent episode 04/27/2006  . Essential hypertension 04/27/2006  . DEGENERATIVE JOINT DISEASE 04/27/2006  . Chronic Low Back Pain 04/27/2006    Patient's Medications  New Prescriptions   No medications on file  Previous Medications   ADVAIR DISKUS 100-50 MCG/DOSE AEPB    Inhale 1 puff into the lungs daily.   ALBUTEROL (PROVENTIL HFA;VENTOLIN HFA) 108 (90 BASE) MCG/ACT INHALER    Inhale 2 puffs into the lungs every 6 (six) hours as needed for wheezing or shortness of breath. Do not take every day, take only if needed   AMLODIPINE (NORVASC) 5 MG TABLET    Take 1 tablet (5 mg total) by mouth daily.   ASPIRIN EC 81 MG EC TABLET    Take 1 tablet (81 mg total) by mouth daily.   BLOOD GLUCOSE MONITORING SUPPL (ONETOUCH VERIO) W/DEVICE KIT    1 each by Does not apply route daily as needed.   CITALOPRAM (CELEXA) 20 MG TABLET     Take 20 mg by mouth daily.   ELVITEGRAVIR-COBICISTAT-EMTRICITABINE-TENOFOVIR (GENVOYA) 150-150-200-10 MG TABS TABLET    Take 1 tablet by mouth daily with breakfast.   GABAPENTIN (NEURONTIN) 300 MG CAPSULE    Take One pill on 1st day, then take one pill twice a day on 2nd day , then take one pill three times a day   GLIPIZIDE (GLUCOTROL) 5 MG TABLET    Take 1 tablet (5 mg total) by mouth daily before breakfast.   GLUCOSE BLOOD (ONETOUCH VERIO) TEST STRIP    Use as instructed   HYDROXYPROPYL METHYLCELLULOSE / HYPROMELLOSE (ISOPTO TEARS / GONIOVISC) 2.5 % OPHTHALMIC SOLUTION    Place 1 drop into both eyes as needed for dry eyes.   HYDROXYZINE (VISTARIL) 50 MG CAPSULE    Take 50 mg by mouth daily.   METFORMIN (GLUCOPHAGE) 500 MG TABLET    Take 1 tablet (500 mg total) by mouth daily with breakfast.   METOPROLOL TARTRATE (LOPRESSOR) 25 MG TABLET    TAKE ONE TABLET BY MOUTH TWICE DAILY   ONETOUCH DELICA LANCETS FINE MISC    Check blood sugar one time day before breakfast   PRAVASTATIN (PRAVACHOL) 40 MG TABLET    Take 1 tablet (40 mg total) by mouth daily.   PREZISTA 800 MG TABLET    TAKE 1 TABLET BY MOUTH EVERY DAY WITH BREAKFAST   QUETIAPINE (SEROQUEL) 25 MG TABLET  Take 25 mg by mouth 2 (two) times daily.    SEROQUEL XR 300 MG 24 HR TABLET    Take 300 mg by mouth at bedtime.   SPIRIVA HANDIHALER 18 MCG INHALATION CAPSULE    INHALE THE CONTENTS OF 1 CASPSULE USING HANDIHALER EVERY DAY  Modified Medications   No medications on file  Discontinued Medications   No medications on file    Subjective: Jacob Joseph is in for his routine HIV follow-up visit. He has been back on his [place] for several months. He denies missing doses. He has been off cocaine and other drugs for over 6 months and is feeling better. He continues to smoke a half pack of cigarettes daily.   Review of Systems: Constitutional: negative Eyes: negative Ears, nose, mouth, throat, and face: negative Respiratory: positive  for dyspnea on exertion, negative for cough and sputum Cardiovascular: negative Gastrointestinal: negative Genitourinary:negative  Past Medical History  Diagnosis Date  . Hypertension   . HIV (human immunodeficiency virus infection)   . Depression   . Stroke 11/2011    Carotids Doppler negative. Right sided weakness resolved, initially on Plavix for 3 months and then continued with ASA.    . TB lung, latent 1988    Treated  . Calcium oxalate crystals in urine 12/23/2011    Asymptomatic, no hematuria. Advised to take plenty of water.  . Hepatitis C   . Diabetes mellitus without complication     pre diabetic  . Peripheral arterial disease     ooccluded left SFA by duplex ultrasound, tibial disease left greater than right    History  Substance Use Topics  . Smoking status: Current Every Day Smoker -- 0.50 packs/day for 41 years    Types: Cigarettes    Last Attempt to Quit: 11/18/2014  . Smokeless tobacco: Never Used  . Alcohol Use: No    Family History  Problem Relation Age of Onset  . Hypertension Mother   . Hypertension Father   . Cancer Father   . Heart attack Mother   . Stroke Mother     Allergies  Allergen Reactions  . Statins Other (See Comments)    Elevated liver enzymes.    Objective:  Filed Vitals:   02/26/15 0934  BP: 128/84  Pulse: 65  Temp: 97.6 F (36.4 C)  TempSrc: Oral  Weight: 231 lb 8 oz (105.008 kg)   Body mass index is 31.39 kg/(m^2).  General: His weight is up 33 pounds in the past 4 months Oral: No oropharyngeal lesions Skin: No rash Lungs: Clear Cor: Regular S1 and S2 with no murmurs Abdomen: Obese, soft and nontender Joints and extremities: Normal Mood: Normal. He does not appear anxious or depressed  Lab Results Lab Results  Component Value Date   WBC 7.6 11/19/2014   HGB 13.8 11/19/2014   HCT 41.8 11/19/2014   MCV 86.7 11/19/2014   PLT 193 11/19/2014    Lab Results  Component Value Date   CREATININE 0.99 01/28/2015    BUN 12 01/28/2015   NA 139 01/28/2015   K 4.4 01/28/2015   CL 105 01/28/2015   CO2 22 01/28/2015    Lab Results  Component Value Date   ALT 15 01/28/2015   AST 20 01/28/2015   ALKPHOS 72 01/28/2015   BILITOT 0.2 01/28/2015    Lab Results  Component Value Date   CHOL 169 11/20/2014   HDL 29* 11/20/2014   LDLCALC 112* 11/20/2014   TRIG 142 11/20/2014  CHOLHDL 5.8 11/20/2014    Lab Results HIV 1 RNA QUANT (copies/mL)  Date Value  11/12/2014 <20  10/04/2014 <20  09/10/2014 51*   CD4 T CELL ABS (/uL)  Date Value  11/12/2014 400  09/10/2014 450  06/25/2014 350*     Problem List Items Addressed This Visit      High   Chronic hepatitis C without hepatic coma (Chronic)    He has completed therapy for chronic hepatitis C and his last 3 viral loads have been undetectable. He has probably been cured.      Human immunodeficiency virus (HIV) disease (Chronic)    His HIV infection has come back under excellent control since restarting anti-retroviral therapy. I will continue his current regimen and have him follow-up after blood work in 6 months.      Relevant Orders   CBC   T-helper cell (CD4)- ([facility] clinic only)   Comprehensive metabolic panel   Lipid panel   RPR   HIV 1 RNA quant-no reflex-bld   Polysubstance abuse    I congratulated him on his sobriety and encouraged him to stay off of cocaine.        Unprioritized   TOBACCO USER (Chronic)    I talked to him again about the importance of cigarette cessation, particularly since he has a history of CVAs. He has a stated desire to quit. I encouraged him to work with his wife, Arelia Sneddon, to develop a plan for both of them to quit.       Other Visit Diagnoses    Encounter for long-term (current) use of medications    -  Primary    Relevant Orders    Lipid panel         Michel Bickers, MD [facility] [place] [phone] pager   [phone] cell 02/26/2015, 10:14 AM

## 2015-02-26 NOTE — Assessment & Plan Note (Signed)
I congratulated him on his sobriety and encouraged him to stay off of cocaine.

## 2015-02-26 NOTE — Assessment & Plan Note (Signed)
He has completed therapy for chronic hepatitis C and his last 3 viral loads have been undetectable. He has probably been cured.

## 2015-02-26 NOTE — Assessment & Plan Note (Signed)
I talked to him again about the importance of cigarette cessation, particularly since he has a history of CVAs. He has a stated desire to quit. I encouraged him to work with his wife, Arelia Sneddon, to develop a plan for both of them to quit.

## 2015-02-26 NOTE — Assessment & Plan Note (Signed)
His HIV infection has come back under excellent control since restarting anti-retroviral therapy. I will continue his current regimen and have him follow-up after blood work in 6 months.

## 2015-03-11 ENCOUNTER — Encounter: Payer: Medicare Other | Admitting: Internal Medicine

## 2015-03-11 ENCOUNTER — Ambulatory Visit: Payer: Medicare Other | Admitting: Dietician

## 2015-03-14 ENCOUNTER — Ambulatory Visit: Payer: Medicare Other | Admitting: Dietician

## 2015-04-08 ENCOUNTER — Ambulatory Visit: Payer: Medicare Other | Admitting: Dietician

## 2015-04-08 ENCOUNTER — Encounter: Payer: Medicare Other | Admitting: Internal Medicine

## 2015-04-08 DIAGNOSIS — F329 Major depressive disorder, single episode, unspecified: Secondary | ICD-10-CM | POA: Diagnosis not present

## 2015-04-09 ENCOUNTER — Other Ambulatory Visit: Payer: Self-pay | Admitting: Internal Medicine

## 2015-04-15 ENCOUNTER — Encounter: Payer: Self-pay | Admitting: Dietician

## 2015-04-15 ENCOUNTER — Ambulatory Visit (INDEPENDENT_AMBULATORY_CARE_PROVIDER_SITE_OTHER): Payer: Medicare Other | Admitting: Internal Medicine

## 2015-04-15 ENCOUNTER — Encounter: Payer: Self-pay | Admitting: Internal Medicine

## 2015-04-15 ENCOUNTER — Ambulatory Visit (INDEPENDENT_AMBULATORY_CARE_PROVIDER_SITE_OTHER): Payer: Medicare Other | Admitting: Dietician

## 2015-04-15 VITALS — BP 112/75 | HR 73 | Temp 99.3°F | Ht 72.0 in | Wt 229.9 lb

## 2015-04-15 DIAGNOSIS — I10 Essential (primary) hypertension: Secondary | ICD-10-CM

## 2015-04-15 DIAGNOSIS — B20 Human immunodeficiency virus [HIV] disease: Secondary | ICD-10-CM | POA: Diagnosis not present

## 2015-04-15 DIAGNOSIS — F528 Other sexual dysfunction not due to a substance or known physiological condition: Secondary | ICD-10-CM | POA: Diagnosis not present

## 2015-04-15 DIAGNOSIS — Z713 Dietary counseling and surveillance: Secondary | ICD-10-CM

## 2015-04-15 DIAGNOSIS — E119 Type 2 diabetes mellitus without complications: Secondary | ICD-10-CM

## 2015-04-15 DIAGNOSIS — I639 Cerebral infarction, unspecified: Secondary | ICD-10-CM | POA: Diagnosis not present

## 2015-04-15 DIAGNOSIS — B1710 Acute hepatitis C without hepatic coma: Secondary | ICD-10-CM | POA: Diagnosis not present

## 2015-04-15 DIAGNOSIS — Z23 Encounter for immunization: Secondary | ICD-10-CM | POA: Diagnosis present

## 2015-04-15 DIAGNOSIS — E118 Type 2 diabetes mellitus with unspecified complications: Secondary | ICD-10-CM

## 2015-04-15 DIAGNOSIS — B351 Tinea unguium: Secondary | ICD-10-CM | POA: Diagnosis not present

## 2015-04-15 LAB — GLUCOSE, CAPILLARY: Glucose-Capillary: 121 mg/dL — ABNORMAL HIGH (ref 65–99)

## 2015-04-15 MED ORDER — SILDENAFIL CITRATE 20 MG PO TABS: ORAL_TABLET | ORAL | Status: DC

## 2015-04-15 MED ORDER — SILDENAFIL CITRATE 50 MG PO TABS: 50.0000 mg | ORAL_TABLET | Freq: Every day | ORAL | Status: DC | PRN

## 2015-04-15 MED ORDER — TERBINAFINE HCL 250 MG PO TABS: 250.0000 mg | ORAL_TABLET | Freq: Every day | ORAL | Status: DC

## 2015-04-15 NOTE — Patient Instructions (Signed)
Mr. Krejci,  I forgot to give you the stevia artificial sweetener to go with your tea bags to make sugar free tea or Koolaid if you'd like!  There is a recipe for souther sweet tea enclosed.  See you in January 2017.  Keep up the good work of caring for yourself and your diabetes!  Butch Penny  [phone]

## 2015-04-15 NOTE — Assessment & Plan Note (Addendum)
Pt has onychomycosis of  Right finger nails and toe nails He had been prescribed 6 week course of terbinafine this past April- he took 4 weeks of terbinafine He saw Dr Amalia Hailey in [place] back in May- has not followed up there  -Restarted terbinafine for 2 more months as for toenail onychomycosis total duration of rx is 3 months -Asked the patient to go back to podiatry as they wanted him back for follow up and he has not gone there

## 2015-04-15 NOTE — Patient Instructions (Addendum)
Thanks for your visit today Please start taking terbinafine daily for 2 months Please follow up with foot doctor Please continue your current medications Please follow up in 3 months Please think about stopping smoking Onychomycosis/Fungal Toenails  WHAT IS IT? An infection that lies within the keratin of your nail plate that is caused by a fungus.  WHY ME? Fungal infections affect all ages, sexes, races, and creeds.  There may be many factors that predispose you to a fungal infection such as age, coexisting medical conditions such as diabetes, or an autoimmune disease; stress, medications, fatigue, genetics, etc.  Bottom line: fungus thrives in a warm, moist environment and your shoes offer such a location.  IS IT CONTAGIOUS? Theoretically, yes.  You do not want to share shoes, nail clippers or files with someone who has fungal toenails.  Walking around barefoot in the same room or sleeping in the same bed is unlikely to transfer the organism.  It is important to realize, however, that fungus can spread easily from one nail to the next on the same foot.  HOW DO WE TREAT THIS?  There are several ways to treat this condition.  Treatment may depend on many factors such as age, medications, pregnancy, liver and kidney conditions, etc.  It is best to ask your doctor which options are available to you.  1. No treatment.   Unlike many other medical concerns, you can live with this condition.  However for many people this can be a painful condition and may lead to ingrown toenails or a bacterial infection.  It is recommended that you keep the nails cut short to help reduce the amount of fungal nail. 2. Topical treatment.  These range from herbal remedies to prescription strength nail lacquers.  About 40-50% effective, topicals require twice daily application for approximately 9 to 12 months or until an entirely new nail has grown out.  The most effective topicals are medical grade medications available  through physicians offices. 3. Oral antifungal medications.  With an 80-90% cure rate, the most common oral medication requires 3 to 4 months of therapy and stays in your system for a year as the new nail grows out.  Oral antifungal medications do require blood work to make sure it is a safe drug for you.  A liver function panel will be performed prior to starting the medication and after the first month of treatment.  It is important to have the blood work performed to avoid any harmful side effects.  In general, this medication safe but blood work is required. 4. Laser Therapy.  This treatment is performed by applying a specialized laser to the affected nail plate.  This therapy is noninvasive, fast, and non-painful.  It is not covered by insurance and is therefore, out of pocket.  The results have been very good with a 80-95% cure rate.  [facility] is the only practice in the area to offer this therapy. 5. Permanent Nail Avulsion.  Removing the entire nail so that a new nail will not grow back.

## 2015-04-15 NOTE — Progress Notes (Signed)
Internal Medicine Clinic Attending  I saw and evaluated the patient.  I personally confirmed the key portions of the history and exam documented by Dr. Saraiya and I reviewed pertinent patient test results.  The assessment, diagnosis, and plan were formulated together and I agree with the documentation in the resident's note.  

## 2015-04-15 NOTE — Assessment & Plan Note (Signed)
BP Readings from Last 3 Encounters:  04/15/15 112/75  02/26/15 128/84  02/11/15 122/71   Well controlled- continue amlodipine 5 mg and metoprolol 25 mg bid RTC in 3 months

## 2015-04-15 NOTE — Progress Notes (Addendum)
  Medical Nutrition Therapy:  Appt start time: 0240 end time:  1506.  Assessment:  Primary concerns today: blood sugar control, eating healthy to stay healthy  Jacob Joseph is here for a follow up. He says he is eating 2-3 meals a day of moderate portions of starchy and sugary foods including rice, beans, corn potatoes and fruit.  He did bring his meter, and is asking for more test strips. He is also trying to quit smoking and still is at 3 cigarettes/day and denies having a quit date or need for any intervention.  He cites financial factors impeding his change from sweet Koolaid to unsweetened. His A1C increased from 6.6 to 7% over the past 3 months. This coincides with weight increase form 206# to 229#.  Learning Readiness: Contemplating/Ready  MEDICATIONS: glipizide, tolerating metformin, seroquel can also be related to weight gain, increased blood sugars WEIGHT: 229.8# back up to baseline  Blood sugars: show trend to lower 100s from mid 100s fasting, likely an effect of metformin.  DIETARY INTAKE: Usual eating pattern includes 3 meals and 2-3 snacks per day. Everyday foods include fried foods, canned foods, chips.  Avoided foods: sugar free candy, sugar free drinks.   Says he is eating more fruit, vegetables and baked foods now and was doing better when he had more meals that were smaller.  Beverages- water and ~ 64 oz regular koolaid/day, will drink tea when they have it.  Usual physical activity: ADLs and he walks a few blocks a few days a week  Estimated energy needs/day: 2250 calories ~ 250-275 g carbohydrates 60-75 per meal and 15-30 per snack   Progress Towards Goal(s):  Resolved.   Nutritional Diagnosis:  NB-1.1 Food and nutrition-related knowledge deficit As related to lack of prior diabetes training improved to their satisfaction  As evidenced by his report and no further questions or concerns.    Intervention:  Nutrition education about methods to lower sugar intake, low blood  sugar signs ans symptoms, how to space out self monitoring to make strips last longer) consider weekly profiles 4x/day before meals and bedtime_and self care  Coordination of care- recommend increasing metformin to 500 mg twice daily at next visit, request dental referral Teaching Method Utilized: Visual,  Auditory and hands on Handouts given during visit include: tea bags and artifical sweetener, low blood sugar handout Barriers to learning/adherence to lifestyle change: finances, comorbid illnesses  Demonstrated degree of understanding via:  Teach Back   Monitoring/Evaluation:  Dietary intake, exercise, meter, and body weight in 3 month(s).

## 2015-04-15 NOTE — Progress Notes (Addendum)
Patient ID: Jacob Joseph, male   DOB: Dec 04, 1955, 59 y.o.   MRN: [id number]    Subjective:   Patient ID: Jacob Joseph male   DOB: 1955/10/15 59 y.o.   MRN: [id number]  HPI: [name] is a 59 y.o. man with notable PMH of depression,  HTN, NIDDM, HIV, HLD, history of CVA, who is here for 2 month follow up and also to discuss toenail infection, and ED.  Please see the Problem List/ A&P for the status of the patient's chronic medical problems.     Past Medical History  Diagnosis Date  . Hypertension   . HIV (human immunodeficiency virus infection)   . Depression   . Stroke 11/2011    Carotids Doppler negative. Right sided weakness resolved, initially on Plavix for 3 months and then continued with ASA.    . TB lung, latent 1988    Treated  . Calcium oxalate crystals in urine 12/23/2011    Asymptomatic, no hematuria. Advised to take plenty of water.  . Hepatitis C   . Diabetes mellitus without complication     pre diabetic  . Peripheral arterial disease     ooccluded left SFA by duplex ultrasound, tibial disease left greater than right   Current Outpatient Prescriptions  Medication Sig Dispense Refill  . ADVAIR DISKUS 100-50 MCG/DOSE AEPB Inhale 1 puff into the lungs daily. 60 each 6  . albuterol (PROVENTIL HFA;VENTOLIN HFA) 108 (90 BASE) MCG/ACT inhaler Inhale 2 puffs into the lungs every 6 (six) hours as needed for wheezing or shortness of breath. Do not take every day, take only if needed 1 Inhaler 2  . amLODipine (NORVASC) 5 MG tablet Take 1 tablet (5 mg total) by mouth daily. 30 tablet 11  . aspirin EC 81 MG EC tablet Take 1 tablet (81 mg total) by mouth daily. 30 tablet 5  . Blood Glucose Monitoring Suppl (ONETOUCH VERIO) W/DEVICE KIT 1 each by Does not apply route daily as needed. 1 kit 0  . citalopram (CELEXA) 20 MG tablet Take 20 mg by mouth daily.    [name] Kitchen elvitegravir-cobicistat-emtricitabine-tenofovir (GENVOYA) 150-150-200-10 MG TABS tablet Take 1 tablet by mouth daily  with breakfast. 30 tablet 11  . gabapentin (NEURONTIN) 300 MG capsule Take One pill on 1st day, then take one pill twice a day on 2nd day , then take one pill three times a day 90 capsule 2  . glipiZIDE (GLUCOTROL) 5 MG tablet Take 1 tablet (5 mg total) by mouth daily before breakfast. 60 tablet 2  . glucose blood (ONETOUCH VERIO) test strip Use as instructed 100 each 12  . hydroxypropyl methylcellulose / hypromellose (ISOPTO TEARS / GONIOVISC) 2.5 % ophthalmic solution Place 1 drop into both eyes as needed for dry eyes.    . metFORMIN (GLUCOPHAGE) 500 MG tablet Take 1 tablet (500 mg total) by mouth daily with breakfast. 30 tablet 11  . metoprolol tartrate (LOPRESSOR) 25 MG tablet TAKE ONE TABLET BY MOUTH TWICE DAILY 60 tablet 11  . ONETOUCH DELICA LANCETS FINE MISC Check blood sugar one time day before breakfast 100 each 5  . pravastatin (PRAVACHOL) 40 MG tablet Take 1 tablet (40 mg total) by mouth daily. 30 tablet 5  . PREZISTA 800 MG tablet TAKE 1 TABLET BY MOUTH EVERY DAY WITH BREAKFAST 30 tablet 5  . SEROQUEL XR 300 MG 24 hr tablet Take 300 mg by mouth at bedtime.    [name] Kitchen SPIRIVA HANDIHALER 18 MCG inhalation capsule INHALE THE CONTENTS OF 1 CASPSULE USING  HANDIHALER EVERY DAY 30 capsule 12  . valACYclovir (VALTREX) 500 MG tablet TK 1 T PO D  1  . sildenafil (REVATIO) 20 MG tablet Take 1-2 tablets before activity 30 tablet 0  . sildenafil (VIAGRA) 50 MG tablet Take 1 tablet (50 mg total) by mouth daily as needed for erectile dysfunction. 20 tablet 0  . terbinafine (LAMISIL) 250 MG tablet Take 1 tablet (250 mg total) by mouth daily. 60 tablet 0   No current facility-administered medications for this visit.   Family History  Problem Relation Age of Onset  . Hypertension Mother   . Hypertension Father   . Cancer Father   . Heart attack Mother   . Stroke Mother    Social History   Social History  . Marital Status: Married    Spouse Name: N/A  . Number of Children: N/A  . Years of  Education: N/A   Social History Main Topics  . Smoking status: Current Every Day Smoker -- 0.50 packs/day for 41 years    Types: Cigarettes    Last Attempt to Quit: 11/18/2014  . Smokeless tobacco: Never Used  . Alcohol Use: No  . Drug Use: No     Comment:  stopped 4 months ago (5/10/116)  . Sexual Activity: Not Asked     Comment: declined condoms   Other Topics Concern  . None   Social History Narrative   Pt lives with wife and grandkid in [place].   Has applied for disability and is pending.   Worked in [place] before about 3 years ago.         Review of Systems: Review of Systems  Constitutional: Negative.   Eyes: Negative for blurred vision.  Respiratory: Negative for cough, sputum production, shortness of breath and wheezing.   Cardiovascular: Negative for chest pain, palpitations, orthopnea, claudication and leg swelling.  Gastrointestinal: Negative for heartburn, nausea, vomiting, abdominal pain, diarrhea and constipation.  Musculoskeletal: Negative.   Neurological: Negative for dizziness and headaches.    Objective:  Physical Exam: Filed Vitals:   04/15/15 1331  BP: 112/75  Pulse: 73  Temp: 99.3 F (37.4 C)  TempSrc: Oral  Height: 6' (1.829 m)  Weight: 229 lb 14.4 oz (104.282 kg)  SpO2: 100%   Physical Exam  Constitutional: He is oriented to person, place, and time. He appears well-developed and well-nourished.  HENT:  Head: Normocephalic and atraumatic.  Eyes: EOM are normal.  Neck: Normal range of motion. Neck supple.  Cardiovascular: Normal rate, regular rhythm and normal heart sounds.   No murmur heard. Pulmonary/Chest: Effort normal and breath sounds normal. He has no wheezes.  Abdominal: Soft. Bowel sounds are normal. He exhibits no distension.  Neurological: He is alert and oriented to person, place, and time.  Skin: Skin is warm.  Psychiatric: He has a normal mood and affect.  Extremities: toenail and right fingernail onychomycosis    Assessment & Plan:   Please see problem based charting for assessment and plan Please see the Problem List/ A&P for the status of the patient's chronic medical problems.

## 2015-04-15 NOTE — Assessment & Plan Note (Signed)
Pt was concerned about erectile dysfunction, and he does not obtain nocturnal erections also. This has been going on for several months. Pt has several risk factors including HTN, smoking, and DM. Pt is also on celexa for the depression. He said he was taking viagra in the past and it was working   Most likely this is a combination of vascular and drug induced ED  -Plan Prescribed sildenafil and gave him the coupon

## 2015-04-15 NOTE — Assessment & Plan Note (Signed)
Lab Results  Component Value Date   HGBA1C 7.0 01/28/2015   HGBA1C 6.6 10/31/2014   HGBA1C 5.8 07/17/2014    Pt diabetes is very well controlled- his log shows average cbg of 126. Has not experienced any lows  Neuropathy well controlled on gabapentin  Plan- continue metformin 500 mg once a day Continue glipizide 5 mg RTC in 3 months

## 2015-04-16 ENCOUNTER — Ambulatory Visit (INDEPENDENT_AMBULATORY_CARE_PROVIDER_SITE_OTHER): Payer: Medicare Other | Admitting: *Deleted

## 2015-04-16 DIAGNOSIS — B20 Human immunodeficiency virus [HIV] disease: Secondary | ICD-10-CM | POA: Diagnosis not present

## 2015-04-16 DIAGNOSIS — Z23 Encounter for immunization: Secondary | ICD-10-CM | POA: Diagnosis present

## 2015-04-16 DIAGNOSIS — B1710 Acute hepatitis C without hepatic coma: Secondary | ICD-10-CM | POA: Diagnosis not present

## 2015-04-18 ENCOUNTER — Telehealth: Payer: Self-pay | Admitting: *Deleted

## 2015-04-18 NOTE — Telephone Encounter (Signed)
Received PA request from pt's insurance for the sildenafil citrate (Revatio) 20mg  tabs.  Per MD notes, Pt has a diagnosis of ED and has several risk factors including HTN, smoking, and DM. Most likely this is a combination of vascular and drug induced ED.  PA request submitted online via Cover My Meds, request sent for review.  Of note, pt was given a Good Rx card that he can use to obtain medication at a discounted price.[name]9/29/20169:17 AM

## 2015-05-14 ENCOUNTER — Other Ambulatory Visit: Payer: Self-pay | Admitting: Internal Medicine

## 2015-05-15 ENCOUNTER — Ambulatory Visit: Payer: Medicare Other | Admitting: Podiatry

## 2015-05-27 ENCOUNTER — Other Ambulatory Visit: Payer: Self-pay | Admitting: *Deleted

## 2015-05-27 DIAGNOSIS — B20 Human immunodeficiency virus [HIV] disease: Secondary | ICD-10-CM

## 2015-05-27 MED ORDER — DARUNAVIR ETHANOLATE 800 MG PO TABS: ORAL_TABLET | ORAL | Status: DC

## 2015-06-27 DIAGNOSIS — F329 Major depressive disorder, single episode, unspecified: Secondary | ICD-10-CM | POA: Diagnosis not present

## 2015-07-09 ENCOUNTER — Other Ambulatory Visit: Payer: Medicare Other

## 2015-07-09 ENCOUNTER — Other Ambulatory Visit: Payer: Self-pay | Admitting: Internal Medicine

## 2015-07-23 ENCOUNTER — Ambulatory Visit: Payer: Medicare Other | Admitting: Internal Medicine

## 2015-07-29 ENCOUNTER — Encounter: Payer: Medicare Other | Admitting: Internal Medicine

## 2015-08-06 ENCOUNTER — Other Ambulatory Visit: Payer: Self-pay | Admitting: Internal Medicine

## 2015-08-28 ENCOUNTER — Other Ambulatory Visit: Payer: Medicare Other

## 2015-09-03 NOTE — Addendum Note (Signed)
Addended by: Orson Gear on: 09/03/2015 10:52 AM   Modules accepted: Orders

## 2015-09-04 ENCOUNTER — Other Ambulatory Visit: Payer: Self-pay | Admitting: Infectious Diseases

## 2015-09-05 ENCOUNTER — Telehealth: Payer: Self-pay | Admitting: *Deleted

## 2015-09-05 ENCOUNTER — Ambulatory Visit: Payer: Medicare Other | Admitting: Internal Medicine

## 2015-09-05 NOTE — Telephone Encounter (Signed)
Left message

## 2015-09-05 NOTE — Telephone Encounter (Signed)
Please advise if the patient needs to continue on Valtrex PO.

## 2015-10-01 ENCOUNTER — Other Ambulatory Visit: Payer: Self-pay | Admitting: Infectious Diseases

## 2015-10-01 DIAGNOSIS — B029 Zoster without complications: Secondary | ICD-10-CM

## 2015-10-07 ENCOUNTER — Encounter: Payer: Self-pay | Admitting: Internal Medicine

## 2015-10-07 ENCOUNTER — Encounter: Payer: Medicare Other | Admitting: Internal Medicine

## 2015-10-30 ENCOUNTER — Other Ambulatory Visit: Payer: Self-pay | Admitting: Internal Medicine

## 2015-10-30 ENCOUNTER — Telehealth: Payer: Self-pay | Admitting: Internal Medicine

## 2015-10-30 MED ORDER — BECLOMETHASONE DIPROPIONATE 40 MCG/ACT IN AERS: 2.0000 | INHALATION_SPRAY | Freq: Two times a day (BID) | RESPIRATORY_TRACT | Status: DC

## 2015-10-30 NOTE — Telephone Encounter (Signed)
Received message from pharmacist saying that there is a drug-drug interaction between his Prezista and Advair, even with his reduced dose.  The safer option will be QVAR.  Will make the change.  Will ask the pt for an appt as he has not been seen here recently anyways.

## 2015-10-30 NOTE — Telephone Encounter (Signed)
Pt has appt in may but has no shows

## 2015-10-31 ENCOUNTER — Other Ambulatory Visit: Payer: Self-pay | Admitting: *Deleted

## 2015-10-31 DIAGNOSIS — B20 Human immunodeficiency virus [HIV] disease: Secondary | ICD-10-CM

## 2015-10-31 MED ORDER — DARUNAVIR ETHANOLATE 800 MG PO TABS: ORAL_TABLET | ORAL | Status: DC

## 2015-11-05 ENCOUNTER — Telehealth: Payer: Self-pay

## 2015-11-05 ENCOUNTER — Other Ambulatory Visit: Payer: Self-pay | Admitting: *Deleted

## 2015-11-05 DIAGNOSIS — E119 Type 2 diabetes mellitus without complications: Secondary | ICD-10-CM

## 2015-11-05 DIAGNOSIS — E1310 Other specified diabetes mellitus with ketoacidosis without coma: Secondary | ICD-10-CM

## 2015-11-05 MED ORDER — ONETOUCH DELICA LANCETS FINE MISC: Status: DC

## 2015-11-05 MED ORDER — GLUCOSE BLOOD VI STRP: ORAL_STRIP | Status: DC

## 2015-11-05 NOTE — Telephone Encounter (Signed)
Spoke w/ pt stressed keeping appt

## 2015-11-05 NOTE — Telephone Encounter (Signed)
Please call pt back.

## 2015-12-02 ENCOUNTER — Ambulatory Visit (INDEPENDENT_AMBULATORY_CARE_PROVIDER_SITE_OTHER): Payer: Medicare Other | Admitting: Internal Medicine

## 2015-12-02 ENCOUNTER — Other Ambulatory Visit: Payer: Self-pay | Admitting: Internal Medicine

## 2015-12-02 VITALS — BP 121/62 | HR 73 | Temp 98.2°F | Ht 72.0 in | Wt 208.9 lb

## 2015-12-02 DIAGNOSIS — F17210 Nicotine dependence, cigarettes, uncomplicated: Secondary | ICD-10-CM | POA: Diagnosis not present

## 2015-12-02 DIAGNOSIS — Z7984 Long term (current) use of oral hypoglycemic drugs: Secondary | ICD-10-CM | POA: Diagnosis not present

## 2015-12-02 DIAGNOSIS — I10 Essential (primary) hypertension: Secondary | ICD-10-CM

## 2015-12-02 DIAGNOSIS — B20 Human immunodeficiency virus [HIV] disease: Secondary | ICD-10-CM

## 2015-12-02 DIAGNOSIS — E559 Vitamin D deficiency, unspecified: Secondary | ICD-10-CM | POA: Diagnosis not present

## 2015-12-02 DIAGNOSIS — Z21 Asymptomatic human immunodeficiency virus [HIV] infection status: Secondary | ICD-10-CM | POA: Diagnosis not present

## 2015-12-02 DIAGNOSIS — Z794 Long term (current) use of insulin: Secondary | ICD-10-CM | POA: Diagnosis not present

## 2015-12-02 DIAGNOSIS — E785 Hyperlipidemia, unspecified: Secondary | ICD-10-CM | POA: Diagnosis not present

## 2015-12-02 DIAGNOSIS — E118 Type 2 diabetes mellitus with unspecified complications: Secondary | ICD-10-CM

## 2015-12-02 DIAGNOSIS — Z299 Encounter for prophylactic measures, unspecified: Secondary | ICD-10-CM

## 2015-12-02 DIAGNOSIS — E119 Type 2 diabetes mellitus without complications: Secondary | ICD-10-CM

## 2015-12-02 LAB — POCT GLYCOSYLATED HEMOGLOBIN (HGB A1C): Hemoglobin A1C: 5.7

## 2015-12-02 LAB — GLUCOSE, CAPILLARY: Glucose-Capillary: 72 mg/dL (ref 65–99)

## 2015-12-02 MED ORDER — METFORMIN HCL 500 MG PO TABS: 500.0000 mg | ORAL_TABLET | Freq: Every day | ORAL | Status: DC

## 2015-12-02 MED ORDER — PRAVASTATIN SODIUM 40 MG PO TABS: 40.0000 mg | ORAL_TABLET | Freq: Every day | ORAL | Status: DC

## 2015-12-02 MED ORDER — AMLODIPINE BESYLATE 5 MG PO TABS: 5.0000 mg | ORAL_TABLET | Freq: Every day | ORAL | Status: DC

## 2015-12-02 MED ORDER — TIOTROPIUM BROMIDE MONOHYDRATE 18 MCG IN CAPS: ORAL_CAPSULE | RESPIRATORY_TRACT | Status: DC

## 2015-12-02 MED ORDER — SILDENAFIL CITRATE 20 MG PO TABS: ORAL_TABLET | ORAL | Status: DC

## 2015-12-02 MED ORDER — METOPROLOL TARTRATE 25 MG PO TABS: 25.0000 mg | ORAL_TABLET | Freq: Two times a day (BID) | ORAL | Status: DC

## 2015-12-02 MED ORDER — GABAPENTIN 300 MG PO CAPS: ORAL_CAPSULE | ORAL | Status: DC

## 2015-12-02 MED ORDER — GLIPIZIDE 5 MG PO TABS: 5.0000 mg | ORAL_TABLET | Freq: Every day | ORAL | Status: DC

## 2015-12-02 NOTE — Assessment & Plan Note (Signed)
Checked vitamin D level

## 2015-12-02 NOTE — Assessment & Plan Note (Addendum)
Lab Results  Component Value Date   HGBA1C 5.7 12/02/2015   HGBA1C 7.0 01/28/2015   HGBA1C 6.6 10/31/2014    His A1c is optimal, and pt is compliant with his metformin 500 mg daily, and glipizide. Given that he does not check his CBGs, I would be concerned about hypoglycemia and with his a1c, we will STOp the glipizide today and continue the metformin. There is room to go up on the metformin.  RTC in 3 months -check urine microalbumin -referred to Ms Butch Penny and Ms Jeralyn Ruths for obtaining meter and education on checking the CBGs.  Addendum At 11 AM on 12/05/15, I called pt and left voicemail that I would like to stop the amlodipine and start lisinopril- as his microalbumin ratio is high, and ACEi is first line for that.  I asked pt to come back in 1 month to re-check blood pressure due to change in regimen, and asked him to call the clinic if he has any questions.

## 2015-12-02 NOTE — Assessment & Plan Note (Signed)
Follows with ID

## 2015-12-02 NOTE — Patient Instructions (Signed)
Thank you for your visit today Please STOP the glipizide. Please continue the metformin Please continue the rest of the medications as we discussed.  I will call you if your lab results are not normal.  Please follow up in 3 months. General Instructions:    Treatment Goals:  Goals (1 Years of Data) as of 12/02/15          As of Today 04/15/15 02/26/15 02/11/15 02/07/15     Blood Pressure   . Blood Pressure < 140/90  121/62 112/75 128/84 122/71 108/78     Result Component   . HEMOGLOBIN A1C < 7.0  5.7         Weight   . Weight < 220 lb (99.791 kg)  208 lb 14.4 oz (94.756 kg) 229 lb 14.4 oz (104.282 kg) 231 lb 8 oz (105.008 kg) 227 lb 9.6 oz (103.239 kg) 226 lb 1.6 oz (102.558 kg)      Self Care Goals & Plans:  Self Care Goal 12/27/2014  Manage my medications take my medicines as prescribed; bring my medications to every visit; refill my medications on time  Monitor my health bring my glucose meter and log to each visit; keep track of my blood pressure; keep track of my blood glucose  Eat healthy foods eat more vegetables; eat foods that are low in salt; eat baked foods instead of fried foods  Be physically active find an activity I enjoy; take a walk every day    Home Blood Glucose Monitoring 10/31/2014  Check my blood sugar once a day  When to check my blood sugar before breakfast     Care Management & Community Referrals:  Referral 10/31/2014  Referrals made for care management support none needed  Referrals made to community resources -

## 2015-12-02 NOTE — Assessment & Plan Note (Addendum)
BP Readings from Last 3 Encounters:  12/02/15 121/62  04/15/15 112/75  02/26/15 128/84   Blood pressure is well controlled on metoprolol 25 mg bid and amlodipine 5 mg daily. He was previously on lisinopril but was changed to atenolol as lisinopril did not achieve the blood pressure control. Pt did not want to switch his anti hypertensives today.  -continue the current regimen -RTC in 3 months   Addendum: His microalbumin ratio is >30 so I am switching amlodipine to lisinopril 5 mg. I called the pt at 11 AM on 5./18/2017 to inform of the same, and asked him to RTC in 1 month to re-assess blood pressure

## 2015-12-02 NOTE — Progress Notes (Signed)
Patient ID: Jacob Joseph, male   DOB: December 31, 1955, 60 y.o.   MRN: [id number]     Subjective:   Patient ID: Jacob Joseph male   DOB: 03/04/1956 60 y.o.   MRN: [id number]  HPI: [name] is a 60 y.o. man with PMH below who is here for a followup    Please see Problem List/A&P for the status of the patient's chronic medical problems      Past Medical History  Diagnosis Date  . Hypertension   . HIV (human immunodeficiency virus infection)   . Depression   . Stroke 11/2011    Carotids Doppler negative. Right sided weakness resolved, initially on Plavix for 3 months and then continued with ASA.    . TB lung, latent 1988    Treated  . Calcium oxalate crystals in urine 12/23/2011    Asymptomatic, no hematuria. Advised to take plenty of water.  . Hepatitis C   . Diabetes mellitus without complication     pre diabetic  . Peripheral arterial disease     ooccluded left SFA by duplex ultrasound, tibial disease left greater than right   Current Outpatient Prescriptions  Medication Sig Dispense Refill  . amLODipine (NORVASC) 5 MG tablet Take 1 tablet (5 mg total) by mouth daily. 30 tablet 11  . beclomethasone (QVAR) 40 MCG/ACT inhaler Inhale 2 puffs into the lungs 2 (two) times daily. 1 Inhaler 2  . Blood Glucose Monitoring Suppl (ONETOUCH VERIO) W/DEVICE KIT 1 each by Does not apply route daily as needed. 1 kit 0  . citalopram (CELEXA) 20 MG tablet Take 20 mg by mouth daily.    . Darunavir Ethanolate (PREZISTA) 800 MG tablet TAKE 1 TABLET BY MOUTH EVERY DAY WITH BREAKFAST 30 tablet 8  . elvitegravir-cobicistat-emtricitabine-tenofovir (GENVOYA) 150-150-200-10 MG TABS tablet Take 1 tablet by mouth daily with breakfast. 30 tablet 11  . glipiZIDE (GLUCOTROL) 5 MG tablet Take 1 tablet (5 mg total) by mouth daily before breakfast. 60 tablet 2  . glucose blood (ONETOUCH VERIO) test strip Use to check blood sugar 3 to 4 times daily. diag code E11.9. Insulin dependent 100 each 12  .  hydroxypropyl methylcellulose / hypromellose (ISOPTO TEARS / GONIOVISC) 2.5 % ophthalmic solution Place 1 drop into both eyes as needed for dry eyes.    . metFORMIN (GLUCOPHAGE) 500 MG tablet Take 1 tablet (500 mg total) by mouth daily with breakfast. 30 tablet 11  . metoprolol tartrate (LOPRESSOR) 25 MG tablet TAKE ONE TABLET BY MOUTH TWICE DAILY 60 tablet 11  . ONETOUCH DELICA LANCETS FINE MISC Check blood sugar one time day before breakfast 100 each 5  . pravastatin (PRAVACHOL) 40 MG tablet TAKE 1 TABLET BY MOUTH DAILY 30 tablet 3  . PROAIR HFA 108 (90 BASE) MCG/ACT inhaler INHALE 2 PUFFS INTO THE LUNGS EVERY 6 HOURS AS NEEDED FOR WHEEZING OR SHORTNESS OF BREATH, TO NOT USE EVERY DAY, USE ONLY IF NEEDED 8.5 g 0  . SEROQUEL XR 300 MG 24 hr tablet Take 300 mg by mouth at bedtime.    [name] Kitchen SPIRIVA HANDIHALER 18 MCG inhalation capsule INHALE THE CONTENTS OF 1 CASPSULE USING HANDIHALER EVERY DAY 30 capsule 12  . valACYclovir (VALTREX) 500 MG tablet TAKE 1 TABLET BY MOUTH DAILY 90 tablet 0  . aspirin EC 81 MG EC tablet Take 1 tablet (81 mg total) by mouth daily. (Patient not taking: Reported on 12/02/2015) 30 tablet 5  . gabapentin (NEURONTIN) 300 MG capsule Take One pill on 1st day, then  take one pill twice a day on 2nd day , then take one pill three times a day (Patient not taking: Reported on 12/02/2015) 90 capsule 2  . sildenafil (REVATIO) 20 MG tablet Take 1-2 tablets before activity (Patient not taking: Reported on 12/02/2015) 30 tablet 0  . sildenafil (VIAGRA) 50 MG tablet Take 1 tablet (50 mg total) by mouth daily as needed for erectile dysfunction. (Patient not taking: Reported on 12/02/2015) 20 tablet 0  . terbinafine (LAMISIL) 250 MG tablet Take 1 tablet (250 mg total) by mouth daily. (Patient not taking: Reported on 12/02/2015) 60 tablet 0   No current facility-administered medications for this visit.   Family History  Problem Relation Age of Onset  . Hypertension Mother   . Hypertension Father     . Cancer Father   . Heart attack Mother   . Stroke Mother    Social History   Social History  . Marital Status: Married    Spouse Name: N/A  . Number of Children: N/A  . Years of Education: N/A   Social History Main Topics  . Smoking status: Current Every Day Smoker -- 0.50 packs/day for 41 years    Types: Cigarettes    Last Attempt to Quit: 11/18/2014  . Smokeless tobacco: Never Used  . Alcohol Use: No  . Drug Use: No     Comment:  stopped 4 months ago (5/10/116)  . Sexual Activity: Not on file     Comment: declined condoms   Other Topics Concern  . Not on file   Social History Narrative   Pt lives with wife and grandkid in [place].   Has applied for disability and is pending.   Worked in [place] before about 3 years ago.         Review of Systems: A comprehensive ROS is obtained and is noted in either in HPI or in problem list, and remainder of comprehensive ROS otherwise negative   Constitutional: Negative for fever, chills, weight loss and malaise/fatigue.  HEENT: No headaches Respiratory: Negative for cough, shortness of breath and wheezing.  Cardiovascular: Negative for chest pain, orthopnea, or PND   Gastrointestinal: Negative for heartburn, nausea, vomiting, abdominal pain, diarrhea and constipation.   Objective:  Physical Exam: Filed Vitals:   12/02/15 1343  BP: 121/62  Pulse: 73  Temp: 98.2 F (36.8 C)  TempSrc: Oral  Height: 6' (1.829 m)  Weight: 208 lb 14.4 oz (94.756 kg)  SpO2: 100%    General: A&O, in NAD HEENT: PERRLA Neck: supple, midline trachea  CV: RRR, normal s1, s2, no m/r/g, no carotid bruits appreciated Resp: equal and symmetric breath sounds, no wheezing or crackles  Abdomen: soft, nontender, nondistended, +BS in all 4 quadrants Extremities: pulses intact b/l, no edema,  Skin: no open lesions or sores   Assessment & Plan:   Please see problem based charting for assessment and plan

## 2015-12-02 NOTE — Assessment & Plan Note (Signed)
He is compliant with his pravastatin and has experienced no adverse effects.  -recheck lipid panel -advised to take fish oil due to low HDL.

## 2015-12-02 NOTE — Assessment & Plan Note (Signed)
He qualifies for low dose CT scan of lung based on > 30 pack year smoking history. Explained pros and cons and he would like to proceed.  -ordered low dose CT

## 2015-12-03 LAB — LIPID PANEL
CHOL/HDL RATIO: 4.1 ratio (ref 0.0–5.0)
CHOLESTEROL TOTAL: 128 mg/dL (ref 100–199)
HDL: 31 mg/dL — AB (ref >39)
LDL CALC: 75 mg/dL (ref 0–99)
TRIGLYCERIDES: 108 mg/dL (ref 0–149)
VLDL Cholesterol Cal: 22 mg/dL (ref 5–40)

## 2015-12-03 LAB — VITAMIN D 25 HYDROXY (VIT D DEFICIENCY, FRACTURES): Vit D, 25-Hydroxy: 13.5 ng/mL — ABNORMAL LOW (ref 30.0–100.0)

## 2015-12-03 LAB — MICROALBUMIN / CREATININE URINE RATIO
Creatinine, Urine: 302 mg/dL
MICROALB/CREAT RATIO: 38 mg/g{creat} — AB (ref 0.0–30.0)
MICROALBUM., U, RANDOM: 114.9 ug/mL

## 2015-12-03 NOTE — Progress Notes (Signed)
Internal Medicine Clinic Attending  Case discussed with Dr. Saraiya at the time of the visit.  We reviewed the resident's history and exam and pertinent patient test results.  I agree with the assessment, diagnosis, and plan of care documented in the resident's note.  

## 2015-12-04 ENCOUNTER — Other Ambulatory Visit: Payer: Self-pay | Admitting: Dietician

## 2015-12-04 DIAGNOSIS — E119 Type 2 diabetes mellitus without complications: Secondary | ICD-10-CM

## 2015-12-04 DIAGNOSIS — Z794 Long term (current) use of insulin: Principal | ICD-10-CM

## 2015-12-04 DIAGNOSIS — E1310 Other specified diabetes mellitus with ketoacidosis without coma: Secondary | ICD-10-CM

## 2015-12-04 DIAGNOSIS — E118 Type 2 diabetes mellitus with unspecified complications: Secondary | ICD-10-CM

## 2015-12-04 NOTE — Telephone Encounter (Signed)
Patient needs refill on diabetes self monitoring supplies. He needs to check blood sugar regularly now off glipizide. He has lost 21 # in past 8 months; this represents 9% of his usual bodyweight. Agree with referral back for MNT this year.

## 2015-12-05 ENCOUNTER — Telehealth: Payer: Self-pay | Admitting: Licensed Clinical Social Worker

## 2015-12-05 ENCOUNTER — Telehealth: Payer: Self-pay | Admitting: Internal Medicine

## 2015-12-05 DIAGNOSIS — E559 Vitamin D deficiency, unspecified: Secondary | ICD-10-CM | POA: Insufficient documentation

## 2015-12-05 MED ORDER — GLUCOSE BLOOD VI STRP: ORAL_STRIP | Status: DC

## 2015-12-05 MED ORDER — VITAMIN D 1000 UNITS PO TABS: 1000.0000 [IU] | ORAL_TABLET | Freq: Every day | ORAL | Status: DC

## 2015-12-05 MED ORDER — ONETOUCH DELICA LANCETS FINE MISC: Status: DC

## 2015-12-05 MED ORDER — LISINOPRIL 5 MG PO TABS: 5.0000 mg | ORAL_TABLET | Freq: Every day | ORAL | Status: DC

## 2015-12-05 NOTE — Telephone Encounter (Signed)
Mr. Allert was referred to CSW as pt voiced difficulty affording his meter and medications.  Pt was contacted by CDE, see telephone note 12/04/15.  CSW placed called to pt.  CSW left message requesting return call. CSW provided contact hours and phone number.  Pt is current with Medicare and Medicaid.  With Mr. Volante being active under both programs, no assistance programs available.  CSW will discuss with patient if needs have been address by CDE or if additional.

## 2015-12-05 NOTE — Addendum Note (Signed)
Addended by: Burgess Estelle A on: 12/05/2015 11:10 AM   Modules accepted: Orders, Medications

## 2015-12-05 NOTE — Telephone Encounter (Signed)
I called pt and left voicemail that I would like to stop the amlodipine and start lisinopril- as his microalbumin ratio is high, and ACEi is first line for that.  I asked pt to come back in 1 month to re-check blood pressure due to change in regimen, and asked him to call the clinic if he has any questions.

## 2015-12-05 NOTE — Assessment & Plan Note (Signed)
I reviewed the results and informed the pt that his Vit D level is low, -start 1000 IU of vitamin D daily. Repeat levels in 1 year

## 2015-12-09 ENCOUNTER — Encounter: Payer: Self-pay | Admitting: Licensed Clinical Social Worker

## 2015-12-09 NOTE — Telephone Encounter (Signed)
CSW placed call to Mr. Lohrmann.  Pt states referral for CSW was for transportation.  Mr. Picasso would like to apply for SCAT.  CSW discussed both SCAT and Medicaid Medical transportation.  Address confirmed, CSW will mail SCAT application and [employer] brochure.

## 2015-12-12 ENCOUNTER — Encounter: Payer: Self-pay | Admitting: *Deleted

## 2015-12-19 ENCOUNTER — Encounter: Payer: Self-pay | Admitting: Licensed Clinical Social Worker

## 2015-12-19 NOTE — Progress Notes (Signed)
Patient ID: Jacob Joseph, male   DOB: 06-07-1956, 60 y.o.   MRN: [id number] CSW received pt's completed PART A and signed Page 1.  PART B of the SCAT application completed.  Completed application faxed to SCAT eligibility office.

## 2015-12-31 ENCOUNTER — Other Ambulatory Visit: Payer: Self-pay | Admitting: Internal Medicine

## 2015-12-31 DIAGNOSIS — A6000 Herpesviral infection of urogenital system, unspecified: Secondary | ICD-10-CM

## 2016-01-02 NOTE — Telephone Encounter (Signed)
Per last note, amlodipine has been changed to lisinopril Also patient has not come for a follow up appt after this change was instituted   Thanks

## 2016-01-07 ENCOUNTER — Ambulatory Visit: Payer: Medicare Other | Admitting: Dietician

## 2016-01-07 ENCOUNTER — Encounter (INDEPENDENT_AMBULATORY_CARE_PROVIDER_SITE_OTHER): Payer: Medicare Other | Admitting: Podiatry

## 2016-01-07 NOTE — Progress Notes (Signed)
This encounter was created in error - please disregard.

## 2016-01-13 ENCOUNTER — Ambulatory Visit (HOSPITAL_COMMUNITY): Admission: RE | Admit: 2016-01-13 | Payer: Medicare Other | Source: Ambulatory Visit

## 2016-01-15 ENCOUNTER — Other Ambulatory Visit: Payer: Medicare Other

## 2016-01-15 ENCOUNTER — Telehealth: Payer: Self-pay | Admitting: Dietician

## 2016-01-15 DIAGNOSIS — B20 Human immunodeficiency virus [HIV] disease: Secondary | ICD-10-CM | POA: Diagnosis not present

## 2016-01-15 DIAGNOSIS — Z79899 Other long term (current) drug therapy: Secondary | ICD-10-CM

## 2016-01-15 LAB — CBC
HCT: 41.8 % (ref 38.5–50.0)
HEMOGLOBIN: 13.7 g/dL (ref 13.2–17.1)
MCH: 27.4 pg (ref 27.0–33.0)
MCHC: 32.8 g/dL (ref 32.0–36.0)
MCV: 83.6 fL (ref 80.0–100.0)
MPV: 11.8 fL (ref 7.5–12.5)
Platelets: 260 10*3/uL (ref 140–400)
RBC: 5 MIL/uL (ref 4.20–5.80)
RDW: 14.5 % (ref 11.0–15.0)
WBC: 10.8 10*3/uL (ref 3.8–10.8)

## 2016-01-15 NOTE — Telephone Encounter (Signed)
Patient needed to reschedule his appointment. Helped him to do this.

## 2016-01-16 ENCOUNTER — Ambulatory Visit: Payer: Medicare Other | Admitting: Dietician

## 2016-01-16 ENCOUNTER — Telehealth: Payer: Self-pay | Admitting: *Deleted

## 2016-01-16 LAB — COMPREHENSIVE METABOLIC PANEL
ALBUMIN: 4.1 g/dL (ref 3.6–5.1)
ALT: 15 U/L (ref 9–46)
AST: 19 U/L (ref 10–35)
Alkaline Phosphatase: 79 U/L (ref 40–115)
BUN: 19 mg/dL (ref 7–25)
CHLORIDE: 106 mmol/L (ref 98–110)
CO2: 19 mmol/L — AB (ref 20–31)
Calcium: 9.5 mg/dL (ref 8.6–10.3)
Creat: 1.35 mg/dL — ABNORMAL HIGH (ref 0.70–1.33)
Glucose, Bld: 161 mg/dL — ABNORMAL HIGH (ref 65–99)
POTASSIUM: 4.5 mmol/L (ref 3.5–5.3)
Sodium: 137 mmol/L (ref 135–146)
TOTAL PROTEIN: 8.3 g/dL — AB (ref 6.1–8.1)
Total Bilirubin: 0.2 mg/dL (ref 0.2–1.2)

## 2016-01-16 LAB — LIPID PANEL
CHOL/HDL RATIO: 3.7 ratio (ref <=5.0)
CHOLESTEROL: 153 mg/dL (ref 125–200)
HDL: 41 mg/dL (ref >=40)
LDL CALC: 82 mg/dL (ref <130)
TRIGLYCERIDES: 148 mg/dL (ref <150)
VLDL: 30 mg/dL (ref <30)

## 2016-01-16 LAB — T-HELPER CELL (CD4) - (RCID CLINIC ONLY)
CD4 % Helper T Cell: 19 % — ABNORMAL LOW (ref 33–55)
CD4 T Cell Abs: 670 /uL (ref 400–2700)

## 2016-01-16 LAB — RPR

## 2016-01-16 LAB — HIV-1 RNA QUANT-NO REFLEX-BLD
HIV 1 RNA Quant: <20 copies/mL (ref <20)
HIV-1 RNA Quant, Log: <1.3 Log copies/mL (ref <1.30)

## 2016-01-16 NOTE — Telephone Encounter (Signed)
Pt calls and states [facility] has faxed paperwork and it needs to be completed Spoke w/ geraldine she sent it to [phone], 6/19 and today, she states in order for pt to get his services this needs to be completed Sending to shanag., chilonb. And dr Tiburcio Pea

## 2016-01-16 NOTE — Telephone Encounter (Signed)
Chart review/Medical record review, pt's PCS form was faxed to [facility] on 01/08/16.  Form obtained from front office as it has yet to be scanned in chart.  CSW placed call to [facility] to confirm receipt.  [name] in receipt of Jacob Joseph PCS form; however, form was rejected for invalid ICD-10 code.  CSW will forward form to PCP for correction.  [name] states they will accept original signed PCS request once PCP completes one line strike through with initial and provides updated ICD-10 code.  Form returned to PCP.

## 2016-01-27 ENCOUNTER — Ambulatory Visit (INDEPENDENT_AMBULATORY_CARE_PROVIDER_SITE_OTHER): Payer: Medicare Other | Admitting: Internal Medicine

## 2016-01-27 ENCOUNTER — Ambulatory Visit (INDEPENDENT_AMBULATORY_CARE_PROVIDER_SITE_OTHER): Payer: Medicare Other | Admitting: Dietician

## 2016-01-27 ENCOUNTER — Encounter: Payer: Self-pay | Admitting: Dietician

## 2016-01-27 VITALS — Ht 72.0 in | Wt 214.9 lb

## 2016-01-27 VITALS — BP 127/74 | HR 100 | Temp 98.5°F | Ht 72.0 in | Wt 214.0 lb

## 2016-01-27 DIAGNOSIS — E785 Hyperlipidemia, unspecified: Secondary | ICD-10-CM

## 2016-01-27 DIAGNOSIS — Z7984 Long term (current) use of oral hypoglycemic drugs: Secondary | ICD-10-CM | POA: Diagnosis not present

## 2016-01-27 DIAGNOSIS — E118 Type 2 diabetes mellitus with unspecified complications: Secondary | ICD-10-CM | POA: Diagnosis not present

## 2016-01-27 DIAGNOSIS — I10 Essential (primary) hypertension: Secondary | ICD-10-CM | POA: Diagnosis not present

## 2016-01-27 DIAGNOSIS — E119 Type 2 diabetes mellitus without complications: Secondary | ICD-10-CM | POA: Diagnosis not present

## 2016-01-27 DIAGNOSIS — E559 Vitamin D deficiency, unspecified: Secondary | ICD-10-CM | POA: Diagnosis not present

## 2016-01-27 DIAGNOSIS — F17200 Nicotine dependence, unspecified, uncomplicated: Secondary | ICD-10-CM

## 2016-01-27 DIAGNOSIS — F1910 Other psychoactive substance abuse, uncomplicated: Secondary | ICD-10-CM

## 2016-01-27 DIAGNOSIS — Z713 Dietary counseling and surveillance: Secondary | ICD-10-CM

## 2016-01-27 DIAGNOSIS — Z794 Long term (current) use of insulin: Principal | ICD-10-CM

## 2016-01-27 MED ORDER — VITAMIN D 1000 UNITS PO TABS: 1000.0000 [IU] | ORAL_TABLET | Freq: Every day | ORAL | Status: AC

## 2016-01-27 MED ORDER — ASPIRIN 81 MG PO TBEC: 81.0000 mg | DELAYED_RELEASE_TABLET | Freq: Every day | ORAL | Status: DC

## 2016-01-27 MED ORDER — VARENICLINE TARTRATE 0.5 MG X 11 & 1 MG X 42 PO MISC: ORAL | Status: DC

## 2016-01-27 MED ORDER — DICLOFENAC SODIUM 1 % TD GEL: 4.0000 g | Freq: Four times a day (QID) | TRANSDERMAL | Status: DC

## 2016-01-27 MED ORDER — GABAPENTIN 300 MG PO CAPS: 600.0000 mg | ORAL_CAPSULE | Freq: Every day | ORAL | Status: DC

## 2016-01-27 MED ORDER — ROSUVASTATIN CALCIUM 20 MG PO TABS: 20.0000 mg | ORAL_TABLET | Freq: Every day | ORAL | Status: DC

## 2016-01-27 NOTE — Assessment & Plan Note (Addendum)
BP Readings from Last 3 Encounters:  01/27/16 127/74  12/02/15 121/62  04/15/15 112/75   Blood pressure is at goal. Pt is compliant with his metoprolol 25 mg BID, and amlodipine 5 mg daily. At last visit we had switched to lisinopril, but pt never started that. Recently, his BMET showed elevated creatinine at 1.35  Plan- continue metoprolol and amlodipine.     Repeat BMET.  If microalbuminuria is established by repeat UA today, then we can consider switching to lisinopril but until then, I explained to the patient that I would like to continue the amlodipine  Addendum: Restarted lisinopril 5 mg as microalb is definitely established

## 2016-01-27 NOTE — Assessment & Plan Note (Signed)
He told me today that he regularly smokes cocaine and uses marijuana and would like to self-refer to a drug rehab program. He last used cocaine yesterday and says he will use it as soon as "he can get his hands on it"  -Given info for [facility] -referred to Ms Jeralyn Ruths

## 2016-01-27 NOTE — Patient Instructions (Addendum)
GREAT WEIGHT LOSS of 15 pounds in past year!!!  Way to go!!!  Try to eat more... 1. Whole grains: whole wheat cereal, crackers, bread, pasta, old fashioned oats & brown rice 2. Whole fruits-1-2 cups 3. Vegetables- 2-3 cups a day 4. Lowfat Protein: Chicken, [place], lean cuts of beef and pork, fish 2-3 times a week 5. Nuts, Seeds and Soy:add walnuts to cereal, peanut butter sandwich  Eat Less... 1. Saturated & Transfats- mostly from red meat, high fat dairy like milk, ice cream, coffee creamer, snack foods like chips, pork rind, fried foods 2. Added Sugar: artifical sweeteners can help 3. Sodium:Use spices instead of salt, avoid salty foods like soup, crackers, chips, lunch meat, sausage, hot dogs a day  Consider finding a reason to motivate you to take a walk at least 3 days of the week  Could make one of those a trip to the farmer's market on Wednesdays, another to exercise/walk or swim at [facility], a third to [employer]?   Please make a follow up in 6-12 months.

## 2016-01-27 NOTE — Assessment & Plan Note (Signed)
Switched to crestor 20 mg for high intensity statin. Pt said he has never been on atorvastatin or other high intensity statin

## 2016-01-27 NOTE — Assessment & Plan Note (Signed)
He still has not started taking vitamin D 1000 IR as prescribed last time.  -Reinforced the need to start vit D and sent rx to pharmacy

## 2016-01-27 NOTE — Progress Notes (Signed)
Medical Nutrition Therapy:  Appt start time: [id number] end time:  1405.  Assessment:  Primary concerns today: blood sugar control, eating healthy to stay healthy  Mr. Earwood is here for a yearly follow up. He says he is eating 2-3 meals a day of moderate portions of starchy, sugary and fried foods.  He brought his two meters and checks twice a day using both. He is also trying to quit smoking and asks for help with medicine.  He cites financial factors impeding his change in food intake. He says he would eat better if he had it, when he had non nutritiive sweetener that is all he used and his weight was lower. Learning Readiness: Ready  MEDICATIONS: was taken off of glipizide, tolerating metformin WEIGHT: 214.9#- decreased 15# since last visit, encouraged him to loose more and at least maintain Labs: CBg at home was 116 this am fasting. last A1C 5.7%, lipids at target, blood pressure at target Weight- decreased ~15# in past year overall, discussed healthy weight range of 205-215# Last dentist 2 years ago [facility] Eyes-cataract surgery ~ last June and August at [facility]- checks daily, no problems and has upcoming scheduled appointment at foot doctor Exercise does when he has to- for example walked here today, walked 9 blocks July 5th, likes to walk, gave information on [facility] Nutrition- vegetables about 3x/week, fruit less than that, fried foods often regular soda and Koolaid,   Breakfast today was sausage biscuit and coffee with sugar and cream at [employer] are usually fried meats, mustard greens, Koolaid or soda, bread Sleep- - gets 8-12 hours but on a shifted schedule of 4-1 Am is bedtime. 1 Pm is wake time  Estimated energy needs/day: 2250 calories ~ 250-275 g carbohydrates 60-75 per meal and 15-30 per snack   Progress Towards Goal(s):  Some progress.   Nutritional Diagnosis:    NI-5.8.3 Inappropriate intake of types of carbohydrates (specify): sugar  and other refined starch As related to competing values in regards to the type of food that is bought in his house and availalbe to him.  As evidenced by his report and weight increasing when he switched back to regular sugared drinks.       Intervention:  Nutrition education about methods to lower sugar intake, improve fruit & vegetable intake, importance of activity and foot and dental care Coordination of care- request dental, eye and smoking cessation referrals. Nurse informed Teaching Method Utilized: Visual,  Auditory and hands on Handouts given during visit include: tea bags and artifical sweetener, AVS, SNAP EBT farmer's market benefit, Advice worker Barriers to learning/adherence to lifestyle change: finances, comorbid illnesses  Demonstrated degree of understanding via:  Teach Back   Monitoring/Evaluation:  Dietary intake, exercise, meter, and body weight in 6 month(s).

## 2016-01-27 NOTE — Progress Notes (Signed)
Patient ID: Jacob Joseph, male   DOB: October 09, 1955, 60 y.o.   MRN: [id number]    CC: hypertension, diabetes, substance abuse HPI: [name] is a 60 y.o. man with PMH noted below who is here for follow up of his hypertension, diabetes, smoking, vitamin D, HLD, substance abuse.   Please see Problem List/A&P for the status of the patient's chronic medical problems   Past Medical History  Diagnosis Date  . Hypertension   . HIV (human immunodeficiency virus infection) ([place])   . Depression   . Stroke ([place]) 11/2011    Carotids Doppler negative. Right sided weakness resolved, initially on Plavix for 3 months and then continued with ASA.    . TB lung, latent 1988    Treated  . Calcium oxalate crystals in urine 12/23/2011    Asymptomatic, no hematuria. Advised to take plenty of water.  . Hepatitis C   . Diabetes mellitus without complication ([place])     pre diabetic  . Peripheral arterial disease ([place])     ooccluded left SFA by duplex ultrasound, tibial disease left greater than right    Review of Systems:  Constitutional: Negative for fever, chills HEENT: No cough Respiratory: Negative for  shortness of breath and wheezing.  Gastrointestinal: Negative for n/v Neuro: has tingling in legs  Physical Exam: Filed Vitals:   01/27/16 1416  BP: 127/74  Pulse: 100  Temp: 98.5 F (36.9 C)  TempSrc: Oral  Height: 6' (1.829 m)  Weight: 214 lb (97.07 kg)  SpO2: 100%    General: A&O, in NAD Neck: supple, midline trachea CV: RRR, normal s1, s2, no m/r/g, no carotid bruits appreciated Resp: equal and symmetric breath sounds, no wheezing or crackles  Abdomen: soft, nontender, nondistended, +BS   Assessment & Plan:   See encounters tab for problem based medical decision making. Patient discussed with Dr. Daryll Drown

## 2016-01-27 NOTE — Assessment & Plan Note (Addendum)
His A1c has been at goal in the last visit. He is compliant with his metformin.  Plan -metformin  -aspirin 81 --changed the pravastatin to crestor 20 mg -increased gabapentin to 600 mg at bedtime for his neuropathy.   -check BMET and urine microalb

## 2016-01-27 NOTE — Assessment & Plan Note (Signed)
He smokes 1/2 ppd- would like to quit smoking and interested in chantix.  -prescribed chantix, and explained the side effects including hallucinations, and if that occurs, then to inform the office. -low dose CT scan based on pack years of smoking

## 2016-01-27 NOTE — Patient Instructions (Signed)
Thank you for your visit today Please start taking the Chantix 1 week before your quit date- and follow the instructions on the starter pack Please take the rosuvastatin (Crestor) instead of the pravastatin Please start taking the aspirin Please take Vitamin D  We have contacted Ms Golden Hurter the clinical social worker who will get in touch with you about getting admission into a drug rehabilitation program.  I also gave you the name of [facility] and the number, but Im unsure as to the costs. She can help more with that.   Please follow up in 3 months

## 2016-01-28 ENCOUNTER — Telehealth: Payer: Self-pay | Admitting: Licensed Clinical Social Worker

## 2016-01-28 LAB — BMP8+ANION GAP
Anion Gap: 19 mmol/L — ABNORMAL HIGH (ref 10.0–18.0)
BUN/Creatinine Ratio: 11 (ref 9–20)
BUN: 14 mg/dL (ref 6–24)
CHLORIDE: 104 mmol/L (ref 96–106)
CO2: 17 mmol/L — ABNORMAL LOW (ref 18–29)
Calcium: 10 mg/dL (ref 8.7–10.2)
Creatinine, Ser: 1.32 mg/dL — ABNORMAL HIGH (ref 0.76–1.27)
GFR calc Af Amer: 68 mL/min/{1.73_m2} (ref >59)
GFR calc non Af Amer: 59 mL/min/{1.73_m2} — ABNORMAL LOW (ref >59)
GLUCOSE: 85 mg/dL (ref 65–99)
Potassium: 3.9 mmol/L (ref 3.5–5.2)
Sodium: 140 mmol/L (ref 134–144)

## 2016-01-28 LAB — MICROALBUMIN / CREATININE URINE RATIO
CREATININE, UR: 83 mg/dL
MICROALB/CREAT RATIO: 40.2 mg/g{creat} — AB (ref 0.0–30.0)
MICROALBUM., U, RANDOM: 33.4 ug/mL

## 2016-01-28 MED ORDER — LISINOPRIL 5 MG PO TABS: 5.0000 mg | ORAL_TABLET | Freq: Every day | ORAL | Status: DC

## 2016-01-28 NOTE — Addendum Note (Signed)
Addended by: Burgess Estelle A on: 01/28/2016 01:15 PM   Modules accepted: Orders

## 2016-01-28 NOTE — Telephone Encounter (Signed)
Left voicemail at 1:15 Pm informing that I would like to start 5 mg lisinopril, mainly for his microalb. Asked the pt to call back the clinic if he was unsure of the message.

## 2016-01-28 NOTE — Progress Notes (Signed)
Internal Medicine Clinic Attending  Case discussed with Dr. Saraiya at the time of the visit.  We reviewed the resident's history and exam and pertinent patient test results.  I agree with the assessment, diagnosis, and plan of care documented in the resident's note.  

## 2016-01-28 NOTE — Telephone Encounter (Signed)
Jacob Joseph was referred to CSW as patient voiced interest in substance abuse programming.  CSW placed called to pt.  CSW left message requesting return call. CSW provided contact hours and phone number.  CSW will discuss resources which include: [facility] - all accept pt's primary and secondary.

## 2016-01-29 ENCOUNTER — Ambulatory Visit (INDEPENDENT_AMBULATORY_CARE_PROVIDER_SITE_OTHER): Payer: Medicare Other | Admitting: Podiatry

## 2016-01-29 ENCOUNTER — Encounter: Payer: Self-pay | Admitting: Podiatry

## 2016-01-29 ENCOUNTER — Other Ambulatory Visit: Payer: Self-pay

## 2016-01-29 DIAGNOSIS — M79676 Pain in unspecified toe(s): Secondary | ICD-10-CM | POA: Diagnosis not present

## 2016-01-29 DIAGNOSIS — B351 Tinea unguium: Secondary | ICD-10-CM | POA: Diagnosis not present

## 2016-01-29 NOTE — Telephone Encounter (Signed)
Requesting Chantix to be filled @ [facility] in [place].

## 2016-01-29 NOTE — Telephone Encounter (Signed)
Faxed to pharm 

## 2016-01-29 NOTE — Patient Instructions (Signed)
Diabetes and Foot Care Diabetes may cause you to have problems because of poor blood supply (circulation) to your feet and legs. This may cause the skin on your feet to become thinner, break easier, and heal more slowly. Your skin may become dry, and the skin may peel and crack. You may also have nerve damage in your legs and feet causing decreased feeling in them. You may not notice minor injuries to your feet that could lead to infections or more serious problems. Taking care of your feet is one of the most important things you can do for yourself.  HOME CARE INSTRUCTIONS  Wear shoes at all times, even in the house. Do not go barefoot. Bare feet are easily injured.  Check your feet daily for blisters, cuts, and redness. If you cannot see the bottom of your feet, use a mirror or ask someone for help.  Wash your feet with warm water (do not use hot water) and mild soap. Then pat your feet and the areas between your toes until they are completely dry. Do not soak your feet as this can dry your skin.  Apply a moisturizing lotion or petroleum jelly (that does not contain alcohol and is unscented) to the skin on your feet and to dry, brittle toenails. Do not apply lotion between your toes.  Trim your toenails straight across. Do not dig under them or around the cuticle. File the edges of your nails with an emery board or nail file.  Do not cut corns or calluses or try to remove them with medicine.  Wear clean socks or stockings every day. Make sure they are not too tight. Do not wear knee-high stockings since they may decrease blood flow to your legs.  Wear shoes that fit properly and have enough cushioning. To break in new shoes, wear them for just a few hours a day. This prevents you from injuring your feet. Always look in your shoes before you put them on to be sure there are no objects inside.  Do not cross your legs. This may decrease the blood flow to your feet.  If you find a minor scrape,  cut, or break in the skin on your feet, keep it and the skin around it clean and dry. These areas may be cleansed with mild soap and water. Do not cleanse the area with peroxide, alcohol, or iodine.  When you remove an adhesive bandage, be sure not to damage the skin around it.  If you have a wound, look at it several times a day to make sure it is healing.  Do not use heating pads or hot water bottles. They may burn your skin. If you have lost feeling in your feet or legs, you may not know it is happening until it is too late.  Make sure your health care provider performs a complete foot exam at least annually or more often if you have foot problems. Report any cuts, sores, or bruises to your health care provider immediately. SEEK MEDICAL CARE IF:   You have an injury that is not healing.  You have cuts or breaks in the skin.  You have an ingrown nail.  You notice redness on your legs or feet.  You feel burning or tingling in your legs or feet.  You have pain or cramps in your legs and feet.  Your legs or feet are numb.  Your feet always feel cold. SEEK IMMEDIATE MEDICAL CARE IF:   There is increasing redness,   swelling, or pain in or around a wound.  There is a red line that goes up your leg.  Pus is coming from a wound.  You develop a fever or as directed by your health care provider.  You notice a bad smell coming from an ulcer or wound.   This information is not intended to replace advice given to you by your health care provider. Make sure you discuss any questions you have with your health care provider.   Document Released: 07/03/2000 Document Revised: 03/08/2013 Document Reviewed: 12/13/2012 Elsevier Interactive Patient Education 2016 Elsevier Inc.  

## 2016-01-30 NOTE — Progress Notes (Signed)
Patient ID: Jacob Joseph, male   DOB: [birth year], 60 y.o.   MRN: [id number]  Subjective: This patient presents today complaining of thickened and elongated toenails which are uncomfortable when walking wearing shoes and requests toenail debridement. He was last seen for this visit on 11/26/2014 and at that time advised to return at three-month intervals for debridement of the mycotic toenails. He does not present until today for follow-up care. He denies any open lesions, claudication since the previous visit Patient has history of previous use of terbinafine for toenail fungus Patient is HIV-positive and a diabetic       Patient appears orientated 3  Vascular: DP 0/4 bilaterally PT right 2/4 PT left 0/4 Capillary reflex immediate bilaterally  Neurological: Sensation to 10 g monofilament wire intact 3/5 bilaterally Vibratory sensation reactive bilaterally Ankle reflex reactive bilaterally  Dermatological: No open skin lesions bilaterally Dry moccasin scaling skin medially plantarly and laterally bilaterally The toenails are extremely elongated, hypertrophic, brittle, discolored and tender to back palpation 6-10  Musculoskeletal: HAV deformities bilaterally    Assessment & Plan:   Assessment: Decrease pedal pulses suggestive peripheral arterial disease Mild peripheral neuropathy Extremely neglected symptomatic onychomycoses 6-10 Tinea pedis bilaterally Type II diabetic  HIV-positive  Plan:   Nails 10 are debrided mechanically and electrically without any bleeding  Reappoint 3 months

## 2016-02-04 ENCOUNTER — Ambulatory Visit (INDEPENDENT_AMBULATORY_CARE_PROVIDER_SITE_OTHER): Payer: Medicare Other | Admitting: Internal Medicine

## 2016-02-04 ENCOUNTER — Encounter: Payer: Self-pay | Admitting: Internal Medicine

## 2016-02-04 VITALS — BP 120/80 | HR 99 | Temp 98.1°F | Wt 215.5 lb

## 2016-02-04 DIAGNOSIS — F17200 Nicotine dependence, unspecified, uncomplicated: Secondary | ICD-10-CM

## 2016-02-04 DIAGNOSIS — Z23 Encounter for immunization: Secondary | ICD-10-CM | POA: Diagnosis present

## 2016-02-04 DIAGNOSIS — Z79899 Other long term (current) drug therapy: Secondary | ICD-10-CM | POA: Diagnosis not present

## 2016-02-04 DIAGNOSIS — B20 Human immunodeficiency virus [HIV] disease: Secondary | ICD-10-CM

## 2016-02-04 DIAGNOSIS — B1710 Acute hepatitis C without hepatic coma: Secondary | ICD-10-CM | POA: Diagnosis not present

## 2016-02-04 NOTE — Progress Notes (Signed)
Patient Active Problem List   Diagnosis Date Noted  . Human immunodeficiency virus (HIV) disease ([place]) 04/27/2006    Priority: High  . Substance abuse 01/27/2016  . Vitamin D deficiency 12/05/2015  . Hyperlipidemia 01/28/2015  . Swelling of upper lip 12/27/2014  . CVA (cerebral infarction) 12/09/2014  . Embolic cerebral infarction ([place])   . Nail fungus 09/26/2014  . Genital herpes 08/29/2014  . Cataracts, bilateral 08/29/2014  . Diabetes mellitus type 2 with complications ([place]) 26/41/5830  . Sinus tachycardia ([place]) 10/19/2013  . COPD, moderate ([place]) 05/31/2013  . Bilateral lower extremity pain 05/08/2013  . DVT, lower extremity ([place]) 04/18/2013  . Complex Lt Renal Cyst 03/22/2013  . Preventive measure 05/30/2012  . Normocytic anemia 03/08/2012  . CKD Stage 2 03/08/2012  . ERECTILE DYSFUNCTION 04/25/2008  . TOBACCO USER 04/27/2006  . Major depressive disorder, recurrent episode ([place]) 04/27/2006  . Essential hypertension 04/27/2006  . DEGENERATIVE JOINT DISEASE 04/27/2006  . Chronic Low Back Pain 04/27/2006    Patient's Medications  New Prescriptions   No medications on file  Previous Medications   ASPIRIN 81 MG EC TABLET    Take 1 tablet (81 mg total) by mouth daily.   BECLOMETHASONE (QVAR) 40 MCG/ACT INHALER    Inhale 2 puffs into the lungs 2 (two) times daily.   BLOOD GLUCOSE MONITORING SUPPL (ONETOUCH VERIO) W/DEVICE KIT    1 each by Does not apply route daily as needed.   CHOLECALCIFEROL (VITAMIN D) 1000 UNITS TABLET    Take 1 tablet (1,000 Units total) by mouth daily.   CITALOPRAM (CELEXA) 20 MG TABLET    Take 20 mg by mouth daily.   DARUNAVIR ETHANOLATE (PREZISTA) 800 MG TABLET    TAKE 1 TABLET BY MOUTH EVERY DAY WITH BREAKFAST   DICLOFENAC SODIUM (VOLTAREN) 1 % GEL    Apply 4 g topically 4 (four) times daily.   GABAPENTIN (NEURONTIN) 300 MG CAPSULE    Take 2 capsules (600 mg total) by mouth at bedtime.   GENVOYA 150-150-200-10 MG TABS TABLET    TAKE 1  TABLET BY MOUTH DAILY WITH BREAKFAST   GLUCOSE BLOOD (ONETOUCH VERIO) TEST STRIP    Use to check blood sugar 1  time daily. diag code E11.9. Insulin dependent   HYDROXYPROPYL METHYLCELLULOSE / HYPROMELLOSE (ISOPTO TEARS / GONIOVISC) 2.5 % OPHTHALMIC SOLUTION    Place 1 drop into both eyes as needed for dry eyes.   LISINOPRIL (PRINIVIL,ZESTRIL) 5 MG TABLET    Take 1 tablet (5 mg total) by mouth daily.   METFORMIN (GLUCOPHAGE) 500 MG TABLET    Take 1 tablet (500 mg total) by mouth daily with breakfast.   METOPROLOL TARTRATE (LOPRESSOR) 25 MG TABLET    Take 1 tablet (25 mg total) by mouth 2 (two) times daily.   ONETOUCH DELICA LANCETS FINE MISC    Check blood sugar one time day before breakfast   PROAIR HFA 108 (90 BASE) MCG/ACT INHALER    INHALE 2 PUFFS INTO THE LUNGS EVERY 6 HOURS AS NEEDED FOR WHEEZING OR SHORTNESS OF BREATH, TO NOT USE EVERY DAY, USE ONLY IF NEEDED   QUETIAPINE (SEROQUEL) 25 MG TABLET       QUETIAPINE (SEROQUEL) 300 MG TABLET       ROSUVASTATIN (CRESTOR) 20 MG TABLET    Take 1 tablet (20 mg total) by mouth at bedtime.   SEROQUEL XR 300 MG 24 HR TABLET    Take 300 mg by  mouth at bedtime.   SILDENAFIL (REVATIO) 20 MG TABLET    Take 1-2 tablets before activity   SILDENAFIL (VIAGRA) 50 MG TABLET    Take 1 tablet (50 mg total) by mouth daily as needed for erectile dysfunction.   TIOTROPIUM (SPIRIVA HANDIHALER) 18 MCG INHALATION CAPSULE    INHALE THE CONTENTS OF 1 CASPSULE USING HANDIHALER EVERY DAY   VALACYCLOVIR (VALTREX) 500 MG TABLET    TAKE 1 TABLET BY MOUTH DAILY   VARENICLINE (CHANTIX STARTING MONTH PAK) 0.5 MG X 11 & 1 MG X 42 TABLET    Take one 0.5 mg tablet by mouth once daily for 3 days, then increase to one 0.5 mg tablet twice daily for 4 days, then increase to one 1 mg tablet twice daily.  Modified Medications   No medications on file  Discontinued Medications   No medications on file    Subjective: Eyal is in for his routine HIV follow-up visit. He states that he's  had no difficulty obtaining, taking or tolerating his Genvoya and Prezista. He denies missing doses. He continues to smoke cigarettes and has no current plan to quit. Unfortunately he relapsed several months ago and is now using cocaine again several times each week. He has been feeling more depressed. He was recently started on Crestor.  Review of Systems: Review of Systems  Constitutional: Positive for malaise/fatigue. Negative for fever, chills, weight loss and diaphoresis.  HENT: Negative for sore throat.   Respiratory: Negative for cough, sputum production and shortness of breath.   Cardiovascular: Negative for chest pain.  Gastrointestinal: Negative for nausea, vomiting, abdominal pain and diarrhea.  Genitourinary: Negative for dysuria and frequency.  Musculoskeletal: Negative for myalgias and joint pain.  Skin: Negative for rash.  Neurological: Negative for dizziness and headaches.  Psychiatric/Behavioral: Positive for depression and substance abuse. The patient is not nervous/anxious.     Past Medical History  Diagnosis Date  . Hypertension   . HIV (human immunodeficiency virus infection) ([place])   . Depression   . Stroke ([place]) 11/2011    Carotids Doppler negative. Right sided weakness resolved, initially on Plavix for 3 months and then continued with ASA.    . TB lung, latent 1988    Treated  . Calcium oxalate crystals in urine 12/23/2011    Asymptomatic, no hematuria. Advised to take plenty of water.  . Hepatitis C   . Diabetes mellitus without complication ([place])     pre diabetic  . Peripheral arterial disease ([name])     ooccluded left SFA by duplex ultrasound, tibial disease left greater than right    Social History  Substance Use Topics  . Smoking status: Current Every Day Smoker -- 0.50 packs/day for 41 years    Types: Cigarettes    Last Attempt to Quit: 11/18/2014  . Smokeless tobacco: Never Used  . Alcohol Use: No    Family History  Problem Relation Age of Onset    . Hypertension Mother   . Hypertension Father   . Cancer Father   . Heart attack Mother   . Stroke Mother     No Active Allergies  Objective:  Filed Vitals:   02/04/16 1446  BP: 120/80  Pulse: 99  Temp: 98.1 F (36.7 C)  TempSrc: Oral  Weight: 215 lb 8 oz (97.75 kg)   Body mass index is 29.22 kg/(m^2).  Physical Exam  Constitutional: He is oriented to person, place, and time.  He is alert and in no distress.  HENT:  Mouth/Throat: No oropharyngeal exudate.  Eyes: Conjunctivae are normal.  Cardiovascular: Normal rate and regular rhythm.   No murmur heard. Pulmonary/Chest: Breath sounds normal.  Abdominal: Soft. He exhibits no mass. There is no tenderness.  Musculoskeletal: Normal range of motion.  Neurological: He is alert and oriented to person, place, and time.  Skin: No rash noted.  Psychiatric:  Flat affect.    Lab Results Lab Results  Component Value Date   WBC 10.8 01/15/2016   HGB 13.7 01/15/2016   HCT 41.8 01/15/2016   MCV 83.6 01/15/2016   PLT 260 01/15/2016    Lab Results  Component Value Date   CREATININE 1.32* 01/27/2016   BUN 14 01/27/2016   NA 140 01/27/2016   K 3.9 01/27/2016   CL 104 01/27/2016   CO2 17* 01/27/2016    Lab Results  Component Value Date   ALT 15 01/15/2016   AST 19 01/15/2016   ALKPHOS 79 01/15/2016   BILITOT 0.2 01/15/2016    Lab Results  Component Value Date   CHOL 153 01/15/2016   HDL 41 01/15/2016   LDLCALC 82 01/15/2016   TRIG 148 01/15/2016   CHOLHDL 3.7 01/15/2016   HIV 1 RNA QUANT (copies/mL)  Date Value  01/15/2016 <20  11/12/2014 <20  10/04/2014 <20   CD4 T CELL ABS (/uL)  Date Value  01/15/2016 670  11/12/2014 400  09/10/2014 450     Problem List Items Addressed This Visit      High   Human immunodeficiency virus (HIV) disease ([place]) (Chronic)    Despite all his other difficulties his adherence remains very good and his HIV infection is under excellent control. He will continue his  current antiretroviral regimen and follow-up after lab work in 6 months. We will have him change his Crestor to an alternative statin that does not interact with his antiretroviral medications.      Relevant Orders   T-helper cell (CD4)- ([facility] clinic only)   HIV 1 RNA quant-no reflex-bld   CBC   Comprehensive metabolic panel   Lipid panel   RPR     Unprioritized   TOBACCO USER (Chronic)    I talked him again about the importance of cigarette cessation, especially in light of his previous CVA. He tells me that he was supposed to start on Chantix but lost his phone and could not see if his pharmacy receive prescription from his primary care physician.       Other Visit Diagnoses    Encounter for long-term (current) use of medications    -  Primary    Relevant Orders    Lipid panel         Michel Bickers, MD [facility] for [place] [phone] pager   [phone] cell 02/04/2016, 5:35 PM

## 2016-02-04 NOTE — Assessment & Plan Note (Signed)
Despite all his other difficulties his adherence remains very good and his HIV infection is under excellent control. He will continue his current antiretroviral regimen and follow-up after lab work in 6 months. We will have him change his Crestor to an alternative statin that does not interact with his antiretroviral medications.

## 2016-02-04 NOTE — Assessment & Plan Note (Signed)
I talked him again about the importance of cigarette cessation, especially in light of his previous CVA. He tells me that he was supposed to start on Chantix but lost his phone and could not see if his pharmacy receive prescription from his primary care physician.

## 2016-02-04 NOTE — Assessment & Plan Note (Signed)
He has had another relapse of his polysubstance abuse and depression. We have scheduled him to meet with Rolena Infante, our behavioral health counselor on 02/10/2016.

## 2016-02-06 ENCOUNTER — Telehealth: Payer: Self-pay

## 2016-02-06 NOTE — Telephone Encounter (Signed)
Patient is calling about a rx not sure which one

## 2016-02-07 DIAGNOSIS — Z79899 Other long term (current) drug therapy: Secondary | ICD-10-CM | POA: Diagnosis not present

## 2016-02-11 ENCOUNTER — Other Ambulatory Visit: Payer: Self-pay | Admitting: Internal Medicine

## 2016-02-11 DIAGNOSIS — E785 Hyperlipidemia, unspecified: Secondary | ICD-10-CM

## 2016-02-11 MED ORDER — ROSUVASTATIN CALCIUM 10 MG PO TABS: 10.0000 mg | ORAL_TABLET | Freq: Every day | ORAL | 6 refills | Status: DC

## 2016-02-11 NOTE — Assessment & Plan Note (Signed)
Informed the pharmacy and switched to crestor 10 mg.

## 2016-02-12 ENCOUNTER — Telehealth: Payer: Self-pay | Admitting: Internal Medicine

## 2016-02-12 DIAGNOSIS — E118 Type 2 diabetes mellitus with unspecified complications: Secondary | ICD-10-CM

## 2016-02-12 NOTE — Telephone Encounter (Signed)
Patient requesting a Eye Exam Referral to go to [facility].  Please Advise.

## 2016-02-12 NOTE — Telephone Encounter (Signed)
No answer, no vmails when called 3 days so far

## 2016-02-14 ENCOUNTER — Ambulatory Visit: Payer: Medicare Other | Admitting: *Deleted

## 2016-02-17 NOTE — Addendum Note (Signed)
Addended by: Yvonna Alanis E on: 02/17/2016 11:35 PM   Modules accepted: Orders

## 2016-02-17 NOTE — Telephone Encounter (Signed)
Placed referral for [place] [facility]

## 2016-02-18 NOTE — Telephone Encounter (Signed)
Attempted to contact patient to let him know the referral was put in, but no answer and no VM

## 2016-02-28 DIAGNOSIS — E119 Type 2 diabetes mellitus without complications: Secondary | ICD-10-CM | POA: Diagnosis not present

## 2016-03-03 ENCOUNTER — Telehealth: Payer: Self-pay | Admitting: *Deleted

## 2016-03-03 NOTE — Telephone Encounter (Signed)
Call to [facility] for Prior Approval for Diclofenac Gel 1%.  Approved 03/03/2016 thru 07/19/2016.   Call to [facility] at [phone] that Prior Authorization has been approved 03/03/2016 thru 07/19/2016   Sander Nephew, RN 03/03/2016 4:48 PM.

## 2016-03-09 ENCOUNTER — Encounter: Payer: Medicare Other | Admitting: Internal Medicine

## 2016-03-13 ENCOUNTER — Telehealth: Payer: Self-pay | Admitting: Internal Medicine

## 2016-03-13 NOTE — Telephone Encounter (Signed)
APT. REMINDER CALL, LMTCB °

## 2016-03-16 ENCOUNTER — Ambulatory Visit (INDEPENDENT_AMBULATORY_CARE_PROVIDER_SITE_OTHER): Payer: Medicare Other | Admitting: Internal Medicine

## 2016-03-16 VITALS — BP 123/69 | HR 79 | Temp 98.2°F | Ht 72.0 in | Wt 229.8 lb

## 2016-03-16 DIAGNOSIS — Z7984 Long term (current) use of oral hypoglycemic drugs: Secondary | ICD-10-CM | POA: Diagnosis not present

## 2016-03-16 DIAGNOSIS — Z7982 Long term (current) use of aspirin: Secondary | ICD-10-CM | POA: Diagnosis not present

## 2016-03-16 DIAGNOSIS — Z23 Encounter for immunization: Secondary | ICD-10-CM

## 2016-03-16 DIAGNOSIS — I10 Essential (primary) hypertension: Secondary | ICD-10-CM | POA: Diagnosis not present

## 2016-03-16 DIAGNOSIS — E119 Type 2 diabetes mellitus without complications: Secondary | ICD-10-CM | POA: Diagnosis not present

## 2016-03-16 DIAGNOSIS — F1910 Other psychoactive substance abuse, uncomplicated: Secondary | ICD-10-CM

## 2016-03-16 DIAGNOSIS — F17210 Nicotine dependence, cigarettes, uncomplicated: Secondary | ICD-10-CM

## 2016-03-16 DIAGNOSIS — E118 Type 2 diabetes mellitus with unspecified complications: Secondary | ICD-10-CM

## 2016-03-16 DIAGNOSIS — Z79899 Other long term (current) drug therapy: Secondary | ICD-10-CM

## 2016-03-16 DIAGNOSIS — F1420 Cocaine dependence, uncomplicated: Secondary | ICD-10-CM | POA: Diagnosis not present

## 2016-03-16 LAB — GLUCOSE, CAPILLARY: Glucose-Capillary: 133 mg/dL — ABNORMAL HIGH (ref 65–99)

## 2016-03-16 LAB — POCT GLYCOSYLATED HEMOGLOBIN (HGB A1C): HEMOGLOBIN A1C: 6.4

## 2016-03-16 MED ORDER — FLUTICASONE PROPIONATE 50 MCG/ACT NA SUSP: 1.0000 | Freq: Every day | NASAL | 2 refills | Status: DC

## 2016-03-16 MED ORDER — VARENICLINE TARTRATE 0.5 MG X 11 & 1 MG X 42 PO MISC: ORAL | 0 refills | Status: DC

## 2016-03-16 MED ORDER — ROSUVASTATIN CALCIUM 10 MG PO TABS: 10.0000 mg | ORAL_TABLET | Freq: Every day | ORAL | 6 refills | Status: DC

## 2016-03-16 MED ORDER — LORATADINE 10 MG PO TABS: 10.0000 mg | ORAL_TABLET | Freq: Every day | ORAL | 2 refills | Status: DC

## 2016-03-16 NOTE — Progress Notes (Signed)
Internal Medicine Clinic Attending  Case discussed with Dr. Saraiya soon after the resident saw the patient.  We reviewed the resident's history and exam and pertinent patient test results.  I agree with the assessment, diagnosis, and plan of care documented in the resident's note.  

## 2016-03-16 NOTE — Assessment & Plan Note (Signed)
Pt has >30 pack year smoking history. He never received chantix last time was prescribed  Assessment: >30 pack yr smoking history  Plan -ordered low dose screening ct scan -re-sent chantix- pt knows about side effects including hallucinations

## 2016-03-16 NOTE — Assessment & Plan Note (Signed)
Lab Results  Component Value Date   HGBA1C 6.4 03/16/2016   HGBA1C 5.7 12/02/2015   HGBA1C 7.0 01/28/2015    Pt is compliant with metformin- he brought his meter which showed numbers were in target range.  Assessment: T2Dm with good glycemic control  Plan -continue metformin 500 mg daily -on aspirin and crestor 10 mg daily -last eye exam 2 weeks ago -last foot exam July 2017 -Follow up in 3 months

## 2016-03-16 NOTE — Patient Instructions (Addendum)
Thank you for your visit today  Please continue your metformin daily. You do not need to check your blood sugars.   Please take the metoprolol half pill twice a day for 3 days and then STOP the metoprolol  Please continue your Vitamin D   Please do the CT scan of your lung   I have also given you information on the [facility] in [place] for drug rehabilitation program  Please follow up in 1 month for blood pressure re-check

## 2016-03-16 NOTE — Progress Notes (Signed)
   CC: HTN, T2DM, smoking, substance abuse, HM HPI: [name] is a 60 y.o. man with PMH noted below here for HTN, T2DM, smoking, substance abuse, and HM.  Please see Problem List/A&P for the status of the patient's chronic medical problems   Past Medical History:  Diagnosis Date  . Calcium oxalate crystals in urine 12/23/2011   Asymptomatic, no hematuria. Advised to take plenty of water.  . Depression   . Diabetes mellitus without complication ([place])    pre diabetic  . Hepatitis C   . HIV (human immunodeficiency virus infection) ([place])   . Hypertension   . Peripheral arterial disease ([place])    ooccluded left SFA by duplex ultrasound, tibial disease left greater than right  . Stroke ([place]) 11/2011   Carotids Doppler negative. Right sided weakness resolved, initially on Plavix for 3 months and then continued with ASA.    . TB lung, latent 1988   Treated    Review of Systems: Denies fevers, chills, fatigue Feels stuffy and has some dry cough Denies n/v/abd pain, Has some back pain  Physical Exam: Vitals:   03/16/16 1508  BP: 123/69  Pulse: 79  Temp: 98.2 F (36.8 C)  TempSrc: Oral  SpO2: 100%  Weight: 229 lb 12.8 oz (104.2 kg)  Height: 6' (1.829 m)    General: A&O, in NAD, obese CV: RRR, normal s1, s2, no m/r/g Resp: equal and symmetric breath sounds, faint wheezing but good air movt  Abdomen: soft, nontender, nondistended, +BS Extremities: no edema, no needle marks , wearing back strap  Assessment & Plan:   See encounters tab for problem based medical decision making. Patient discussed with Dr. Lynnae January

## 2016-03-16 NOTE — Assessment & Plan Note (Signed)
BP Readings from Last 3 Encounters:  03/16/16 123/69  02/04/16 120/80  01/27/16 127/74   Pt is compliant with the metoprolol, amlodipine and the lisinopril  Assessment: Hypertension at goal Plan -continue amlodipine and lisinopril -Stop the metoprolol due to his continuing substance abuse (last crack/cocaine use 3 weeks ago)- asked him to take 12.5 mg BID for 3 days then stop  -Pt to follow up in 1 month for blood pressure check

## 2016-03-16 NOTE — Assessment & Plan Note (Signed)
Pt continues to use cocaine and last used 3 weeks ago. Had discussed with Ms Jeralyn Ruths and [facility] takes his insurance.  Plan -reiterated to abstain from cocaine -given info on [facility] -stopped beta blocker due to ongoing cocaine use -follow up in 3 months

## 2016-03-17 ENCOUNTER — Encounter: Payer: Self-pay | Admitting: Internal Medicine

## 2016-03-26 ENCOUNTER — Encounter: Payer: Self-pay | Admitting: *Deleted

## 2016-03-31 ENCOUNTER — Ambulatory Visit (HOSPITAL_COMMUNITY): Payer: Medicare Other

## 2016-04-02 ENCOUNTER — Other Ambulatory Visit: Payer: Self-pay | Admitting: *Deleted

## 2016-04-02 NOTE — Telephone Encounter (Signed)
Received faxed refill request from pt's pharmacy for test strips-per pt chart, he should have refills avail.  Pharmacy was contacted, rx written in May 2017 with prn refills.  No further action is required, phone call complete.[name]9/14/201711:15 AM

## 2016-04-03 ENCOUNTER — Ambulatory Visit (HOSPITAL_COMMUNITY)
Admission: RE | Admit: 2016-04-03 | Discharge: 2016-04-03 | Disposition: A | Discharge status: Home / Self Care | Payer: Medicare Other | Source: Ambulatory Visit | Attending: Internal Medicine | Admitting: Internal Medicine

## 2016-04-03 DIAGNOSIS — F17210 Nicotine dependence, cigarettes, uncomplicated: Secondary | ICD-10-CM | POA: Diagnosis not present

## 2016-04-03 DIAGNOSIS — Z122 Encounter for screening for malignant neoplasm of respiratory organs: Secondary | ICD-10-CM | POA: Diagnosis not present

## 2016-04-03 DIAGNOSIS — I2510 Atherosclerotic heart disease of native coronary artery without angina pectoris: Secondary | ICD-10-CM | POA: Insufficient documentation

## 2016-04-03 DIAGNOSIS — I700 Atherosclerosis of aorta: Secondary | ICD-10-CM | POA: Insufficient documentation

## 2016-04-06 ENCOUNTER — Telehealth: Payer: Self-pay | Admitting: Internal Medicine

## 2016-04-06 DIAGNOSIS — M549 Dorsalgia, unspecified: Principal | ICD-10-CM

## 2016-04-06 DIAGNOSIS — G8929 Other chronic pain: Secondary | ICD-10-CM

## 2016-04-06 NOTE — Telephone Encounter (Signed)
Pt requesting a Pain Management Referral for Back Pain.  Please Advise.

## 2016-04-06 NOTE — Telephone Encounter (Signed)
Referral placed.

## 2016-04-13 ENCOUNTER — Encounter: Payer: Medicare Other | Admitting: Internal Medicine

## 2016-04-28 ENCOUNTER — Other Ambulatory Visit: Payer: Self-pay | Admitting: Internal Medicine

## 2016-04-28 DIAGNOSIS — E785 Hyperlipidemia, unspecified: Secondary | ICD-10-CM

## 2016-04-28 NOTE — Addendum Note (Signed)
Addended by: Hulan Fray on: 04/28/2016 06:13 PM   Modules accepted: Orders

## 2016-05-06 ENCOUNTER — Telehealth: Payer: Self-pay | Admitting: *Deleted

## 2016-05-06 ENCOUNTER — Encounter: Payer: Self-pay | Admitting: Podiatry

## 2016-05-06 ENCOUNTER — Ambulatory Visit (INDEPENDENT_AMBULATORY_CARE_PROVIDER_SITE_OTHER): Payer: Medicare Other | Admitting: Podiatry

## 2016-05-06 VITALS — BP 126/71 | HR 96 | Resp 28

## 2016-05-06 DIAGNOSIS — B351 Tinea unguium: Secondary | ICD-10-CM | POA: Diagnosis not present

## 2016-05-06 DIAGNOSIS — M79676 Pain in unspecified toe(s): Secondary | ICD-10-CM | POA: Diagnosis not present

## 2016-05-06 NOTE — Progress Notes (Signed)
Patient ID: Jacob Joseph, male   DOB: Jul 08, 1956, 60 y.o.   MRN: [id number]    Subjective: This patient presents today complaining of thickened and elongated toenails which are uncomfortable when walking wearing shoes and requests toenail debridement. He was last seen for this visit on 11/26/2014 and at that time advised to return at three-month intervals for debridement of the mycotic toenails. He does not present until today for follow-up care. He denies any open lesions, claudication since the previous visit Patient has history of previous use of terbinafine for toenail fungus Patient is HIV-positive and a diabetic      Patient appears orientated 3  Vascular: DP 0/4 bilaterally PT right 2/4 PT left 0/4 Capillary reflex immediate bilaterally  Neurological: Sensation to 10 g monofilament wire intact 3/5 bilaterally Vibratory sensation reactive bilaterally Ankle reflex reactive bilaterally  Dermatological: No open skin lesions bilaterally Dry moccasin scaling skin medially plantarly and laterally bilaterally The toenails are extremely elongated, hypertrophic, brittle, discolored and tender to back palpation 6-10  Musculoskeletal: HAV deformities bilaterally    Assessment & Plan:   Assessment: Decrease pedal pulses suggestive peripheral arterial disease Mild peripheral neuropathy Extremely neglected symptomatic onychomycoses 6-10 Tinea pedis bilaterally HIV-positive  Plan:  Nails 10 are debrided mechanically and electrically without any bleeding  Reappoint 3 months

## 2016-05-06 NOTE — Telephone Encounter (Addendum)
Call to [facility] for PA for Qvar Inhaler. Information was provided.and to be sent to Pharmacist for review.  Result in 24 to 72 hours.  Sander Nephew, RN 05/06/2016   Prior Authorization for Qvar was denied by [facility].  Pt will need to try Plumicrt Flexhaler, Serevent Diskus which are preferred.  Form to be placed in Dr. Starr Lake box for completion if patient needs to use Qvar.  Sander Nephew, RN 05/07/2016 8:54 AM

## 2016-05-06 NOTE — Patient Instructions (Signed)
Diabetes and Foot Care Diabetes may cause you to have problems because of poor blood supply (circulation) to your feet and legs. This may cause the skin on your feet to become thinner, break easier, and heal more slowly. Your skin may become dry, and the skin may peel and crack. You may also have nerve damage in your legs and feet causing decreased feeling in them. You may not notice minor injuries to your feet that could lead to infections or more serious problems. Taking care of your feet is one of the most important things you can do for yourself.  HOME CARE INSTRUCTIONS  Wear shoes at all times, even in the house. Do not go barefoot. Bare feet are easily injured.  Check your feet daily for blisters, cuts, and redness. If you cannot see the bottom of your feet, use a mirror or ask someone for help.  Wash your feet with warm water (do not use hot water) and mild soap. Then pat your feet and the areas between your toes until they are completely dry. Do not soak your feet as this can dry your skin.  Apply a moisturizing lotion or petroleum jelly (that does not contain alcohol and is unscented) to the skin on your feet and to dry, brittle toenails. Do not apply lotion between your toes.  Trim your toenails straight across. Do not dig under them or around the cuticle. File the edges of your nails with an emery board or nail file.  Do not cut corns or calluses or try to remove them with medicine.  Wear clean socks or stockings every day. Make sure they are not too tight. Do not wear knee-high stockings since they may decrease blood flow to your legs.  Wear shoes that fit properly and have enough cushioning. To break in new shoes, wear them for just a few hours a day. This prevents you from injuring your feet. Always look in your shoes before you put them on to be sure there are no objects inside.  Do not cross your legs. This may decrease the blood flow to your feet.  If you find a minor scrape,  cut, or break in the skin on your feet, keep it and the skin around it clean and dry. These areas may be cleansed with mild soap and water. Do not cleanse the area with peroxide, alcohol, or iodine.  When you remove an adhesive bandage, be sure not to damage the skin around it.  If you have a wound, look at it several times a day to make sure it is healing.  Do not use heating pads or hot water bottles. They may burn your skin. If you have lost feeling in your feet or legs, you may not know it is happening until it is too late.  Make sure your health care provider performs a complete foot exam at least annually or more often if you have foot problems. Report any cuts, sores, or bruises to your health care provider immediately. SEEK MEDICAL CARE IF:   You have an injury that is not healing.  You have cuts or breaks in the skin.  You have an ingrown nail.  You notice redness on your legs or feet.  You feel burning or tingling in your legs or feet.  You have pain or cramps in your legs and feet.  Your legs or feet are numb.  Your feet always feel cold. SEEK IMMEDIATE MEDICAL CARE IF:   There is increasing redness,   swelling, or pain in or around a wound.  There is a red line that goes up your leg.  Pus is coming from a wound.  You develop a fever or as directed by your health care provider.  You notice a bad smell coming from an ulcer or wound.   This information is not intended to replace advice given to you by your health care provider. Make sure you discuss any questions you have with your health care provider.   Document Released: 07/03/2000 Document Revised: 03/08/2013 Document Reviewed: 12/13/2012 Elsevier Interactive Patient Education 2016 Elsevier Inc.  

## 2016-05-11 ENCOUNTER — Emergency Department (HOSPITAL_COMMUNITY): Payer: Medicare Other

## 2016-05-11 ENCOUNTER — Encounter (HOSPITAL_COMMUNITY): Payer: Self-pay | Admitting: Emergency Medicine

## 2016-05-11 ENCOUNTER — Observation Stay (HOSPITAL_COMMUNITY)
Admission: EM | Admit: 2016-05-11 | Discharge: 2016-05-12 | Disposition: A | Discharge status: Home / Self Care | Payer: Medicare Other | Attending: Internal Medicine | Admitting: Internal Medicine

## 2016-05-11 DIAGNOSIS — Z7982 Long term (current) use of aspirin: Secondary | ICD-10-CM | POA: Insufficient documentation

## 2016-05-11 DIAGNOSIS — Z791 Long term (current) use of non-steroidal anti-inflammatories (NSAID): Secondary | ICD-10-CM | POA: Insufficient documentation

## 2016-05-11 DIAGNOSIS — M5416 Radiculopathy, lumbar region: Secondary | ICD-10-CM | POA: Diagnosis not present

## 2016-05-11 DIAGNOSIS — I2609 Other pulmonary embolism with acute cor pulmonale: Secondary | ICD-10-CM | POA: Diagnosis present

## 2016-05-11 DIAGNOSIS — J189 Pneumonia, unspecified organism: Secondary | ICD-10-CM | POA: Insufficient documentation

## 2016-05-11 DIAGNOSIS — B1920 Unspecified viral hepatitis C without hepatic coma: Secondary | ICD-10-CM | POA: Insufficient documentation

## 2016-05-11 DIAGNOSIS — M545 Low back pain: Secondary | ICD-10-CM | POA: Diagnosis not present

## 2016-05-11 DIAGNOSIS — R079 Chest pain, unspecified: Secondary | ICD-10-CM | POA: Diagnosis not present

## 2016-05-11 DIAGNOSIS — F17200 Nicotine dependence, unspecified, uncomplicated: Secondary | ICD-10-CM | POA: Diagnosis not present

## 2016-05-11 DIAGNOSIS — I2699 Other pulmonary embolism without acute cor pulmonale: Secondary | ICD-10-CM | POA: Diagnosis not present

## 2016-05-11 DIAGNOSIS — I129 Hypertensive chronic kidney disease with stage 1 through stage 4 chronic kidney disease, or unspecified chronic kidney disease: Secondary | ICD-10-CM | POA: Diagnosis not present

## 2016-05-11 DIAGNOSIS — E1151 Type 2 diabetes mellitus with diabetic peripheral angiopathy without gangrene: Secondary | ICD-10-CM | POA: Insufficient documentation

## 2016-05-11 DIAGNOSIS — E1122 Type 2 diabetes mellitus with diabetic chronic kidney disease: Secondary | ICD-10-CM | POA: Diagnosis not present

## 2016-05-11 DIAGNOSIS — Z86718 Personal history of other venous thrombosis and embolism: Secondary | ICD-10-CM | POA: Diagnosis not present

## 2016-05-11 DIAGNOSIS — N182 Chronic kidney disease, stage 2 (mild): Secondary | ICD-10-CM | POA: Diagnosis not present

## 2016-05-11 DIAGNOSIS — B20 Human immunodeficiency virus [HIV] disease: Secondary | ICD-10-CM | POA: Diagnosis not present

## 2016-05-11 DIAGNOSIS — J449 Chronic obstructive pulmonary disease, unspecified: Secondary | ICD-10-CM | POA: Insufficient documentation

## 2016-05-11 DIAGNOSIS — Z7984 Long term (current) use of oral hypoglycemic drugs: Secondary | ICD-10-CM | POA: Diagnosis not present

## 2016-05-11 DIAGNOSIS — G894 Chronic pain syndrome: Secondary | ICD-10-CM | POA: Diagnosis not present

## 2016-05-11 DIAGNOSIS — Z79899 Other long term (current) drug therapy: Secondary | ICD-10-CM | POA: Diagnosis not present

## 2016-05-11 DIAGNOSIS — Z8611 Personal history of tuberculosis: Secondary | ICD-10-CM | POA: Diagnosis not present

## 2016-05-11 DIAGNOSIS — Z8673 Personal history of transient ischemic attack (TIA), and cerebral infarction without residual deficits: Secondary | ICD-10-CM | POA: Diagnosis not present

## 2016-05-11 DIAGNOSIS — F329 Major depressive disorder, single episode, unspecified: Secondary | ICD-10-CM | POA: Insufficient documentation

## 2016-05-11 DIAGNOSIS — Z7951 Long term (current) use of inhaled steroids: Secondary | ICD-10-CM | POA: Insufficient documentation

## 2016-05-11 DIAGNOSIS — M79609 Pain in unspecified limb: Secondary | ICD-10-CM | POA: Diagnosis not present

## 2016-05-11 DIAGNOSIS — R0789 Other chest pain: Secondary | ICD-10-CM | POA: Diagnosis not present

## 2016-05-11 DIAGNOSIS — R0602 Shortness of breath: Secondary | ICD-10-CM | POA: Diagnosis not present

## 2016-05-11 LAB — BASIC METABOLIC PANEL
ANION GAP: 9 (ref 5–15)
BUN: 10 mg/dL (ref 6–20)
CHLORIDE: 104 mmol/L (ref 101–111)
CO2: 23 mmol/L (ref 22–32)
Calcium: 9.2 mg/dL (ref 8.9–10.3)
Creatinine, Ser: 1.36 mg/dL — ABNORMAL HIGH (ref 0.61–1.24)
GFR calc Af Amer: >60 mL/min (ref >60)
GFR calc non Af Amer: 56 mL/min — ABNORMAL LOW (ref >60)
GLUCOSE: 150 mg/dL — AB (ref 65–99)
POTASSIUM: 3.9 mmol/L (ref 3.5–5.1)
Sodium: 136 mmol/L (ref 135–145)

## 2016-05-11 LAB — CBC
HEMATOCRIT: 36.4 % — AB (ref 39.0–52.0)
HEMOGLOBIN: 12 g/dL — AB (ref 13.0–17.0)
MCH: 28.2 pg (ref 26.0–34.0)
MCHC: 33 g/dL (ref 30.0–36.0)
MCV: 85.4 fL (ref 78.0–100.0)
Platelets: 174 10*3/uL (ref 150–400)
RBC: 4.26 MIL/uL (ref 4.22–5.81)
RDW: 14.8 % (ref 11.5–15.5)
WBC: 11.7 10*3/uL — ABNORMAL HIGH (ref 4.0–10.5)

## 2016-05-11 LAB — I-STAT TROPONIN, ED: Troponin i, poc: 0 ng/mL (ref 0.00–0.08)

## 2016-05-11 MED ORDER — HEPARIN BOLUS VIA INFUSION
5000.0000 [IU] | Freq: Once | INTRAVENOUS | Status: AC
  Administered 2016-05-12: 5000 [IU] via INTRAVENOUS
  Filled 2016-05-11: qty 5000

## 2016-05-11 MED ORDER — HEPARIN (PORCINE) IN NACL 100-0.45 UNIT/ML-% IJ SOLN
1600.0000 [IU]/h | INTRAMUSCULAR | Status: DC
  Administered 2016-05-12: 1600 [IU]/h via INTRAVENOUS
  Filled 2016-05-11: qty 250

## 2016-05-11 MED ORDER — DEXTROSE 5 % IV SOLN
1.0000 g | Freq: Once | INTRAVENOUS | Status: AC
  Administered 2016-05-11: 1 g via INTRAVENOUS
  Filled 2016-05-11: qty 10

## 2016-05-11 MED ORDER — DEXTROSE 5 % IV SOLN
500.0000 mg | Freq: Once | INTRAVENOUS | Status: AC
  Administered 2016-05-11: 500 mg via INTRAVENOUS
  Filled 2016-05-11: qty 500

## 2016-05-11 MED ORDER — IOPAMIDOL (ISOVUE-370) INJECTION 76%
INTRAVENOUS | Status: AC
  Administered 2016-05-11: 80 mL
  Filled 2016-05-11: qty 100

## 2016-05-11 NOTE — H&P (Signed)
Date: 05/12/2016               Patient Name:  Jacob Joseph MRN: [id number]  DOB: June 24, 1956 Age / Sex: 60 y.o., male   PCP: Burgess Estelle, MD         Medical Service: Internal Medicine Teaching Service         Attending Physician: Dr. Aldine Contes, MD    First Contact: Dr. Ophelia Shoulder Pager: [phone]  Second Contact: Dr. Charlynn Grimes Pager: [phone]       After Hours (After 5p/  First Contact Pager: [phone]  weekends / holidays): Second Contact Pager: [phone]   Chief Complaint: Chest Pain  History of Present Illness: [name] is a 60 year old male with PMHx of HIV on HAART, T2DM, HTN,  COPD and DVT that presents to the ED for worsening chest pain.  He first noticed the chest pain upon waking in the morning.  He states that the pain is localized to the left side of the chest and worse with inspiration.  He states he has never experienced this chest pain before.  He has associated symptoms of shortness of breath and right leg swelling without pain.  He reports a productive cough that is chronic without hemoptysis.  He denies fever, chills, nausea or vomiting.     Meds:  Current Meds  Medication Sig  . aspirin 81 MG EC tablet Take 1 tablet (81 mg total) by mouth daily.  . beclomethasone (QVAR) 40 MCG/ACT inhaler Inhale 2 puffs into the lungs 2 (two) times daily. (Patient taking differently: Inhale 2 puffs into the lungs daily. )  . cholecalciferol (VITAMIN D) 1000 units tablet Take 1 tablet (1,000 Units total) by mouth daily.  . citalopram (CELEXA) 20 MG tablet Take 20 mg by mouth daily.  . Darunavir Ethanolate (PREZISTA) 800 MG tablet TAKE 1 TABLET BY MOUTH EVERY DAY WITH BREAKFAST  . diclofenac sodium (VOLTAREN) 1 % GEL Apply 4 g topically 4 (four) times daily. (Patient taking differently: Apply 4 g topically 4 (four) times daily as needed (pain). )  . gabapentin (NEURONTIN) 300 MG capsule Take 2 capsules (600 mg total) by mouth at bedtime.  . GENVOYA 150-150-200-10  MG TABS tablet TAKE 1 TABLET BY MOUTH DAILY WITH BREAKFAST  . hydroxypropyl methylcellulose / hypromellose (ISOPTO TEARS / GONIOVISC) 2.5 % ophthalmic solution Place 1 drop into both eyes as needed for dry eyes.  [name] Kitchen lisinopril (PRINIVIL,ZESTRIL) 5 MG tablet Take 1 tablet (5 mg total) by mouth daily.  . metFORMIN (GLUCOPHAGE) 500 MG tablet Take 1 tablet (500 mg total) by mouth daily with breakfast.  . PROAIR HFA 108 (90 BASE) MCG/ACT inhaler INHALE 2 PUFFS INTO THE LUNGS EVERY 6 HOURS AS NEEDED FOR WHEEZING OR SHORTNESS OF BREATH, TO NOT USE EVERY DAY, USE ONLY IF NEEDED  . QUEtiapine (SEROQUEL) 300 MG tablet Take 300 mg by mouth 2 (two) times daily.   . rosuvastatin (CRESTOR) 10 MG tablet Take 1 tablet (10 mg total) by mouth at bedtime.  . sildenafil (VIAGRA) 50 MG tablet Take 1 tablet (50 mg total) by mouth daily as needed for erectile dysfunction.  [name] Kitchen tiotropium (SPIRIVA HANDIHALER) 18 MCG inhalation capsule INHALE THE CONTENTS OF 1 CASPSULE USING HANDIHALER EVERY DAY  . valACYclovir (VALTREX) 500 MG tablet TAKE 1 TABLET BY MOUTH DAILY     Allergies: Allergies as of 05/11/2016  . (No Known Allergies)   Past Medical History:  Diagnosis Date  . Calcium oxalate crystals  in urine 12/23/2011   Asymptomatic, no hematuria. Advised to take plenty of water.  . Depression   . Diabetes mellitus without complication ([place])    pre diabetic  . Hepatitis C   . HIV (human immunodeficiency virus infection) ([place])   . Hypertension   . Peripheral arterial disease ([place])    ooccluded left SFA by duplex ultrasound, tibial disease left greater than right  . Stroke ([place]) 11/2011   Carotids Doppler negative. Right sided weakness resolved, initially on Plavix for 3 months and then continued with ASA.    . TB lung, latent 1988   Treated    Family History: Mother:  Myocardial infarction at 76yo, HTN, Father:  HTN, Sister: Diabetes  Social History: Tobacco use: 20 pack year smoking history Alcohol use:  denies Illicit drug use: denies  Review of Systems: A complete ROS was negative except as per HPI.   Physical Exam: Blood pressure 147/91, pulse 95, temperature 99.1 F (37.3 C), temperature source Oral, resp. rate 17, height 6' (1.829 m), weight 225 lb (102.1 kg), SpO2 94 %. Vitals:   05/11/16 2245 05/11/16 2300 05/11/16 2315 05/11/16 2330  BP: 153/92 156/83 154/84 147/91  Pulse: 95 94 93 95  Resp:   22 17  Temp:      TempSrc:      SpO2: 91% 90% 93% 94%  Weight:      Height:       General: Vital signs reviewed.  Patient is well-developed and well-nourished, in no acute distress and cooperative with exam.  Head: Normocephalic and atraumatic. Eyes: EOMI, conjunctivae normal, no scleral icterus.  Neck: Supple, trachea midline, normal ROM  Cardiovascular: RRR, S1 normal, S2 normal, no murmurs, gallops, or rubs. Pulmonary/Chest: mild rhonchi and crackles present throughout lung fields.  Poor inspiratory effort Abdominal: Soft, non-tender, non-distended, BS +, no masses, organomegaly, or guarding present.  Musculoskeletal: No joint deformities, erythema, or stiffness, ROM full and nontender. Extremities: No lower extremity edema bilaterally,  pulses symmetric and intact bilaterally. No cyanosis or clubbing. Neurological: A&O x3, Strength is normal and symmetric bilaterally, no focal motor deficit, sensory intact to light touch bilaterally.  Skin: Warm, dry and intact. No rashes or erythema. Psychiatric: Normal mood and affect. speech and behavior is normal. Cognition and memory are normal.   BMET    Component Value Date/Time   NA 136 05/11/2016 2004   NA 140 01/27/2016 1527   K 3.9 05/11/2016 2004   CL 104 05/11/2016 2004   CO2 23 05/11/2016 2004   GLUCOSE 150 (H) 05/11/2016 2004   BUN 10 05/11/2016 2004   BUN 14 01/27/2016 1527   CREATININE 1.36 (H) 05/11/2016 2004   CREATININE 1.35 (H) 01/15/2016 1440   CALCIUM 9.2 05/11/2016 2004   GFRNONAA 56 (L) 05/11/2016 2004    GFRNONAA 84 01/28/2015 1555   GFRAA >60 05/11/2016 2004   GFRAA >89 01/28/2015 1555   CBC    Component Value Date/Time   WBC 11.7 (H) 05/11/2016 2004   RBC 4.26 05/11/2016 2004   HGB 12.0 (L) 05/11/2016 2004   HCT 36.4 (L) 05/11/2016 2004   PLT 174 05/11/2016 2004   MCV 85.4 05/11/2016 2004   MCH 28.2 05/11/2016 2004   MCHC 33.0 05/11/2016 2004   RDW 14.8 05/11/2016 2004   LYMPHSABS 2.3 11/12/2014 1357   MONOABS 0.6 11/12/2014 1357   EOSABS 0.3 11/12/2014 1357   BASOSABS 0.0 11/12/2014 1357    EKG: Sinus tachycardia  CT angiogram of chest 1. Findings consistent  with acute pulmonary embolus involving anterior segmental branch of left upper lobe. RV LV ratio is 1, consistent with right heart strain. Positive for acute PE with CT evidence of right heart strain (RV/LV Ratio = 1) consistent with at least submassive (intermediate risk) PE.  2. Hazy and streaky airspace disease within the bilateral lower lobes, right middle lobe and peripheral left upper lobe, edema versus infiltrates.  CXR: FINDINGS: Normal heart size. No pleural effusion or edema. The lung volumes are low. Atelectasis is identified within both lung bases.  IMPRESSION: 1. Decreased lung volumes with bibasilar atelectasis.  Assessment & Plan by Problem:  Acute pulmonary Embolism Patient presented with acute chest pain, tachycardia and shortness of breath.  He was not hypoxic on admission.  Patient has a history of DVT in his right leg and placed on coumadin for one year and is not currently on anticoagulation.  Today CT angiogram revealed an acute pulmonary embolus involving the anterior segmental branch of left upper lobe and RV LV ratio consistent with right heart strain.  Pulmonary critical care was contacted and recommended regular anticoagulation as there was no indication for lytics.  Troponins are negative.  Patient's only risk factor for PE is smoking.  He denied recent travel, surgery or lower  extremity pain.  He will likely need lifelong anticoagulation.  - Continue to trend troponins - check BNP for further assessment of heart strain - Heparin with the thought of transitioning to a DOAC - Tylenol - Nitroglycerin  History of DVT Patient was found to have a right leg DVT in 04/14/13.  He states he was on coumadin for one year prior to stopping.  He is currently not on anticoagulation.  Since he did not have issues with coumadin in the past and followed in the IM coumadin clinic this could be an option for anticoagulation instead of DOAC. - started heparin  Abnormal image finding CT angiogram also noted airspace disease in bilateral lower lobes and right middle lobe and peripheral left upper lobe, edema versus infiltrates.  Patient is having a productive cough that is chronic.  He is afebrile.  I do not believe he has a pneumonia at this time.  Will continue to monitor. - Delsym  - CBC    HIV Follows with Dr. Megan Salon and is on [name] and [place]. In 01/15/16 his CD4 T Cells were 670 and HIV 1 RNA was not detected.  He is compliant with his medications and under well control. - continue HIV medications  COPD Stable. No wheezing on exam.  Patient takes Qvar and Spiriva - continue home medications  CKD stage II Patient has a creatinine of 1.3 that appears is his baseline. - Continuing lisinopril - BMET in the morning    Type II Diabetes Mellitus Last Hgb A1C was 6.4 and under well control.  Pateint is on metformin and gabapentin - continue home medications  Hypertension Mildly elevated.  Probably due to current PE.  Patient takes lisinopril - continue home medications   Dispo: Admit patient to Inpatient with expected length of stay greater than 2 midnights.  Signed: Valinda Party, DO 05/12/2016, 12:09 AM  Pager: [phone]

## 2016-05-11 NOTE — ED Triage Notes (Signed)
Pt arrived via [facility] c/o left sided chest pain and a productive cough that he has has "forever" Pt smokes 1/2 pk/day.  HX of COPD.  EMS gave 5mg  albuterol, 125mg  solumedrol, 324mg  ASA, and 1 SL Nitro. Pain is 10/10

## 2016-05-11 NOTE — ED Provider Notes (Signed)
[facility] DEPT Provider Note   CSN: [id number] Arrival date & time: 05/11/16  1924     History   Chief Complaint Chief Complaint  Patient presents with  . Chest Pain    HPI Jacob Joseph is a 60 y.o. male who presents with chest pain. PMH significant for HIV currently on ART, substance abuse (Cocaine), tobacco use, hx of CVA, Hep C, DM, hx of Tb, PAD, hx of DVT. He states that he had an acute onset of chest pain earlier today. Pain is worse with inspiration. It is on the left side of his chest, non-radiating. Reports associated SOB and productive cough which has had for several weeks. He reports associated R leg swelling. EMS was called who gave albuterol, steroids, ASA, and Nitro. Pain is 10/10. Denies fever, chills, diaphoresis, hemoptysis, abdominal pain, N/V/D. Last saw ID (Dr. Megan Salon) in April. CD4 count was >400 and viral load was undetectable.   HPI  Past Medical History:  Diagnosis Date  . Calcium oxalate crystals in urine 12/23/2011   Asymptomatic, no hematuria. Advised to take plenty of water.  . Depression   . Diabetes mellitus without complication ([place])    pre diabetic  . Hepatitis C   . HIV (human immunodeficiency virus infection) ([place])   . Hypertension   . Peripheral arterial disease ([place])    ooccluded left SFA by duplex ultrasound, tibial disease left greater than right  . Stroke ([place]) 11/2011   Carotids Doppler negative. Right sided weakness resolved, initially on Plavix for 3 months and then continued with ASA.    . TB lung, latent 1988   Treated    Patient Active Problem List   Diagnosis Date Noted  . Substance abuse 01/27/2016  . Vitamin D deficiency 12/05/2015  . Hyperlipidemia 01/28/2015  . Cataracts, bilateral 08/29/2014  . Diabetes mellitus type 2 with complications ([place]) 123456  . COPD, moderate ([place]) 05/31/2013  . Bilateral lower extremity pain 05/08/2013  . Complex Lt Renal Cyst 03/22/2013  . Preventive measure 05/30/2012  .  Normocytic anemia 03/08/2012  . CKD Stage 2 03/08/2012  . ERECTILE DYSFUNCTION 04/25/2008  . Human immunodeficiency virus (HIV) disease ([place]) 04/27/2006  . Smoking greater than 30 pack years 04/27/2006  . Major depressive disorder, recurrent episode ([place]) 04/27/2006  . Essential hypertension 04/27/2006  . DEGENERATIVE JOINT DISEASE 04/27/2006  . Chronic Low Back Pain 04/27/2006    Past Surgical History:  Procedure Laterality Date  . HERNIA REPAIR    . INGUINAL HERNIA REPAIR  01/16/2012   Procedure: HERNIA REPAIR INGUINAL INCARCERATED;  Surgeon: Zenovia Jarred, MD;  Location: [facility];  Service: General;  Laterality: Right;  . TEE WITHOUT CARDIOVERSION  12/07/2011   Procedure: TRANSESOPHAGEAL ECHOCARDIOGRAM (TEE);  Surgeon: Birdie Riddle, MD;  Location[facility] ENDOSCOPY;  Service: Cardiovascular;  Laterality: N/A;       Home Medications    Prior to Admission medications   Medication Sig Start Date End Date Taking? Authorizing Provider  aspirin 81 MG EC tablet Take 1 tablet (81 mg total) by mouth daily. 01/27/16  Yes Burgess Estelle, MD  beclomethasone (QVAR) 40 MCG/ACT inhaler Inhale 2 puffs into the lungs 2 (two) times daily. Patient taking differently: Inhale 2 puffs into the lungs daily.  10/30/15  Yes Burgess Estelle, MD  cholecalciferol (VITAMIN D) 1000 units tablet Take 1 tablet (1,000 Units total) by mouth daily. 01/27/16  Yes Burgess Estelle, MD  citalopram (CELEXA) 20 MG tablet Take 20 mg by mouth daily. 10/25/14  Yes Historical  Provider, MD  Darunavir Ethanolate (PREZISTA) 800 MG tablet TAKE 1 TABLET BY MOUTH EVERY DAY WITH BREAKFAST 10/31/15  Yes Michel Bickers, MD  diclofenac sodium (VOLTAREN) 1 % GEL Apply 4 g topically 4 (four) times daily. Patient taking differently: Apply 4 g topically 4 (four) times daily as needed (pain).  01/27/16  Yes Burgess Estelle, MD  gabapentin (NEURONTIN) 300 MG capsule Take 2 capsules (600 mg total) by mouth at bedtime. 01/27/16  Yes Burgess Estelle, MD  GENVOYA  150-150-200-10 MG TABS tablet TAKE 1 TABLET BY MOUTH DAILY WITH BREAKFAST 12/03/15  Yes Michel Bickers, MD  hydroxypropyl methylcellulose / hypromellose (ISOPTO TEARS / GONIOVISC) 2.5 % ophthalmic solution Place 1 drop into both eyes as needed for dry eyes.   Yes Historical Provider, MD  lisinopril (PRINIVIL,ZESTRIL) 5 MG tablet Take 1 tablet (5 mg total) by mouth daily. 01/28/16  Yes Burgess Estelle, MD  metFORMIN (GLUCOPHAGE) 500 MG tablet Take 1 tablet (500 mg total) by mouth daily with breakfast. 12/02/15 12/01/16 Yes Burgess Estelle, MD  PROAIR HFA 108 (90 BASE) MCG/ACT inhaler INHALE 2 PUFFS INTO THE LUNGS EVERY 6 HOURS AS NEEDED FOR WHEEZING OR SHORTNESS OF BREATH, TO NOT USE EVERY DAY, USE ONLY IF NEEDED 04/22/15  Yes Burgess Estelle, MD  QUEtiapine (SEROQUEL) 300 MG tablet Take 300 mg by mouth 2 (two) times daily.  11/11/15  Yes Historical Provider, MD  rosuvastatin (CRESTOR) 10 MG tablet Take 1 tablet (10 mg total) by mouth at bedtime. 03/16/16 03/16/17 Yes Burgess Estelle, MD  sildenafil (VIAGRA) 50 MG tablet Take 1 tablet (50 mg total) by mouth daily as needed for erectile dysfunction. 04/15/15  Yes Burgess Estelle, MD  tiotropium (SPIRIVA HANDIHALER) 18 MCG inhalation capsule INHALE THE CONTENTS OF 1 CASPSULE USING HANDIHALER EVERY DAY 12/02/15  Yes Burgess Estelle, MD  valACYclovir (VALTREX) 500 MG tablet TAKE 1 TABLET BY MOUTH DAILY 12/31/15  Yes Michel Bickers, MD  fluticasone [facility]) 50 MCG/ACT nasal spray Place 1 spray into both nostrils daily. Patient not taking: Reported on 05/11/2016 03/16/16 03/16/17  Burgess Estelle, MD  loratadine (CLARITIN) 10 MG tablet Take 1 tablet (10 mg total) by mouth daily. Patient not taking: Reported on 05/11/2016 03/16/16 03/16/17  Burgess Estelle, MD  sildenafil (REVATIO) 20 MG tablet Take 1-2 tablets before activity Patient not taking: Reported on 05/11/2016 12/02/15   Burgess Estelle, MD  varenicline (CHANTIX STARTING MONTH PAK) 0.5 MG X 11 & 1 MG X 42 tablet Take one 0.5 mg  tablet by mouth once daily for 3 days, then increase to one 0.5 mg tablet twice daily for 4 days, then increase to one 1 mg tablet twice daily. Patient not taking: Reported on 05/11/2016 03/16/16   Burgess Estelle, MD    Family History Family History  Problem Relation Age of Onset  . Hypertension Mother   . Heart attack Mother   . Stroke Mother   . Hypertension Father   . Cancer Father     Social History Social History  Substance Use Topics  . Smoking status: Current Every Day Smoker    Packs/day: 0.50    Years: 41.00    Types: Cigarettes    Last attempt to quit: 11/18/2014  . Smokeless tobacco: Never Used  . Alcohol use No     Allergies   Review of patient's allergies indicates no known allergies.   Review of Systems Review of Systems  Constitutional: Negative for chills and fever.  Respiratory: Positive for cough and shortness of breath. Negative for  choking and wheezing.   Cardiovascular: Positive for chest pain and leg swelling.  Gastrointestinal: Negative for abdominal pain, diarrhea, nausea and vomiting.  All other systems reviewed and are negative.    Physical Exam Updated Vital Signs BP 151/88   Pulse 92   Temp 99.1 F (37.3 C) (Oral)   Resp 21   Ht 6' (1.829 m)   Wt 102.1 kg   SpO2 96%   BMI 30.52 kg/m   Physical Exam  Constitutional: He is oriented to person, place, and time. He appears well-developed and well-nourished. No distress.  Appears ill  HENT:  Head: Normocephalic and atraumatic.  Eyes: Conjunctivae are normal. Pupils are equal, round, and reactive to light. Right eye exhibits no discharge. Left eye exhibits no discharge. No scleral icterus.  Neck: Normal range of motion. Neck supple.  Cardiovascular: Regular rhythm.  Tachycardia present.  Exam reveals no gallop and no friction rub.   No murmur heard. Pulmonary/Chest: Breath sounds normal. Tachypnea noted. No respiratory distress. He has no wheezes. He has no rales. He exhibits no  tenderness.  Abdominal: Soft. Bowel sounds are normal. He exhibits no distension. There is no tenderness.  Musculoskeletal: He exhibits no edema.  R leg more swollen than left. No calf tenderness  Neurological: He is alert and oriented to person, place, and time.  Skin: Skin is warm and dry.  Psychiatric: He has a normal mood and affect.  Nursing note and vitals reviewed.    ED Treatments / Results  Labs (all labs ordered are listed, but only abnormal results are displayed) Labs Reviewed  BASIC METABOLIC PANEL - Abnormal; Notable for the following:       Result Value   Glucose, Bld 150 (*)    Creatinine, Ser 1.36 (*)    GFR calc non Af Amer 56 (*)    All other components within normal limits  CBC - Abnormal; Notable for the following:    WBC 11.7 (*)    Hemoglobin 12.0 (*)    HCT 36.4 (*)    All other components within normal limits  HEPARIN LEVEL (UNFRACTIONATED)  CBC  I-STAT TROPOININ, ED    EKG  EKG Interpretation None       Radiology Dg Chest 2 View  Result Date: 05/11/2016 CLINICAL DATA:  Chest pain and shortness of breath. EXAM: CHEST  2 VIEW COMPARISON:  04/03/2016 FINDINGS: Normal heart size. No pleural effusion or edema. The lung volumes are low. Atelectasis is identified within both lung bases. IMPRESSION: 1. Decreased lung volumes with bibasilar atelectasis. Electronically Signed   By: Kerby Moors M.D.   On: 05/11/2016 21:39   Ct Angio Chest Pe W/cm &/or Wo Cm  Result Date: 05/11/2016 CLINICAL DATA:  Left-sided chest pain EXAM: CT ANGIOGRAPHY CHEST WITH CONTRAST TECHNIQUE: Multidetector CT imaging of the chest was performed using the standard protocol during bolus administration of intravenous contrast. Multiplanar CT image reconstructions and MIPs were obtained to evaluate the vascular anatomy. CONTRAST:  80 mL Isovue 370 intravenous. COMPARISON:  Chest x-ray 05/11/2016.  CT chest 04/03/2016. FINDINGS: Cardiovascular: Moderate filling defect within an  anterior segmental branch of the left upper lobe pulmonary artery, series 5, image number 141, consistent with acute embolus. No additional filling defects are visualized. RV LV ratio is 1, consistent with right heart strain. Thoracic aorta is mildly ectatic. No dissection is evident. There are coronary artery calcifications. The heart is slightly enlarged. Trace fluid in the superior pericardial recess. Mediastinum/Nodes: No significantly enlarged mediastinal or hilar  nodes. Sub cm nonspecific lymph nodes within the mediastinum. No axillary adenopathy. Trachea and mainstem bronchi appear normal. The esophagus grossly unremarkable. Lungs/Pleura: No large effusion. Previously noted pulmonary nodules are not well visualized on the current exam, likely due to interval airspace opacification of the lungs. There is hazy and streaky density present within the bilateral lower lobes, right middle lobe, and mild patchy peripheral areas in the left upper lobe. Upper Abdomen: No acute findings in the upper abdomen. Musculoskeletal: No acute osseous abnormality. Review of the MIP images confirms the above findings. IMPRESSION: 1. Findings consistent with acute pulmonary embolus involving anterior segmental branch of left upper lobe. RV LV ratio is 1, consistent with right heart strain. Positive for acute PE with CT evidence of right heart strain (RV/LV Ratio = 1) consistent with at least submassive (intermediate risk) PE. The presence of right heart strain has been associated with an increased risk of morbidity and mortality. Please activate Code PE by paging [phone]. Critical Value/emergent results were called by telephone at the time of interpretation on 05/11/2016 at 10:44 pm to Dr. Janetta Hora , who verbally acknowledged these results. 2. Hazy and streaky airspace disease within the bilateral lower lobes, right middle lobe and peripheral left upper lobe, edema versus infiltrates. Electronically Signed   By: Donavan Foil M.D.   On: 05/11/2016 22:45    Procedures Procedures (including critical care time)  Medications Ordered in ED Medications  azithromycin (ZITHROMAX) 500 mg in dextrose 5 % 250 mL IVPB (500 mg Intravenous New Bag/Given 05/11/16 2344)  heparin bolus via infusion 5,000 Units (not administered)  heparin ADULT infusion 100 units/mL (25000 units/260mL sodium chloride 0.45%) (not administered)  iopamidol (ISOVUE-370) 76 % injection (80 mLs  Contrast Given 05/11/16 2139)  cefTRIAXone (ROCEPHIN) 1 g in dextrose 5 % 50 mL IVPB (0 g Intravenous Stopped 05/11/16 2342)     Initial Impression / Assessment and Plan / ED Course  I have reviewed the triage vital signs and the nursing notes.  Pertinent labs & imaging results that were available during my care of the patient were reviewed by me and considered in my medical decision making (see chart for details).  Clinical Course   60 year old male presents with acute PE with possible infiltrate. Patient is afebrile and not hypoxic. He is tachypneic and tachycardic. CT shows submassive PE in anterior segmental branch of LU lobe with evidence of right heart strain. Heparin drip started. Also shows possible infiltrate in RML and peripheral upper lobe. Ceftriaxone and Azithromycin started to cover for possible PNA. CBC remarkable to leukocytosis of 11.7 and mild anemia. BMP remarkable for SCr of 1.39 which is at baseline.   Spoke with Dr. Vaughan Browner with critical care who states that since patient is not hypotensive with normal troponin they will see patient in consult. Spoke with IM resident who will admit patient to inpatient tele under Dr. Dareen Piano. Appreciate assistance.  Final Clinical Impressions(s) / ED Diagnoses   Final diagnoses:  Other acute pulmonary embolism with acute cor pulmonale [facility])    New Prescriptions Current Discharge Medication List       Recardo Evangelist, PA-C 05/12/16 0006    Gareth Morgan, MD 05/16/16 1555

## 2016-05-11 NOTE — Progress Notes (Signed)
Contacted by ED regarding LUL segmental PE on CT scan. Images on CT and labs reviewed. Pt has stable hemodynamic and resp status with sPESI score of 0 with low risk of death. There is no indication for lytics.  Recommend regular anticoagulation   Marshell Garfinkel MD [place] [facility] Pager [phone] If no answer or after 3pm call: [phone] 05/11/2016, 11:30 PM

## 2016-05-11 NOTE — Progress Notes (Addendum)
ANTICOAGULATION CONSULT NOTE - Initial Consult  Pharmacy Consult for heparin Indication: pulmonary embolus  No Known Allergies  Patient Measurements: Height: 6' (182.9 cm) Weight: 225 lb (102.1 kg) IBW/kg (Calculated) : 77.6 Heparin Dosing Weight: 100kg  Vital Signs: Temp: 99.1 F (37.3 C) (10/23 1945) Temp Source: Oral (10/23 1945) BP: 147/91 (10/23 2330) Pulse Rate: 95 (10/23 2330)  Labs:  Recent Labs  05/11/16 2004  HGB 12.0*  HCT 36.4*  PLT 174  CREATININE 1.36*    Estimated Creatinine Clearance: 73.2 mL/min (by C-G formula based on SCr of 1.36 mg/dL (H)).   Medical History: Past Medical History:  Diagnosis Date  . Calcium oxalate crystals in urine 12/23/2011   Asymptomatic, no hematuria. Advised to take plenty of water.  . Depression   . Diabetes mellitus without complication ([place])    pre diabetic  . Hepatitis C   . HIV (human immunodeficiency virus infection) ([place])   . Hypertension   . Peripheral arterial disease ([place])    ooccluded left SFA by duplex ultrasound, tibial disease left greater than right  . Stroke ([place]) 11/2011   Carotids Doppler negative. Right sided weakness resolved, initially on Plavix for 3 months and then continued with ASA.    . TB lung, latent 1988   Treated    Assessment: 60yo male c/o left-sided CP, CT reveals acute PE w/ evidence of RHS, no plan for lytics, to begin heparin.  Goal of Therapy:  Heparin level 0.3-0.7 units/ml Monitor platelets by anticoagulation protocol: Yes   Plan:  Will give heparin 5000 units IV bolus x1 followed by gtt at 1600 units/hr and monitor heparin levels and CBC; f/u long-term anticoag plans.  Wynona Neat, PharmD, BCPS  05/11/2016,11:47 PM    ADDENDUM: Initial heparin level at goal at 0.69; bolus given late and lab drawn early so expect true SS level to be lower.  Will continue gtt for now at current rate and confirm stable with additional level. VB     05/12/2016 6:36 AM

## 2016-05-12 DIAGNOSIS — Z21 Asymptomatic human immunodeficiency virus [HIV] infection status: Secondary | ICD-10-CM

## 2016-05-12 DIAGNOSIS — J189 Pneumonia, unspecified organism: Secondary | ICD-10-CM | POA: Diagnosis not present

## 2016-05-12 DIAGNOSIS — E1122 Type 2 diabetes mellitus with diabetic chronic kidney disease: Secondary | ICD-10-CM | POA: Diagnosis not present

## 2016-05-12 DIAGNOSIS — I2699 Other pulmonary embolism without acute cor pulmonale: Secondary | ICD-10-CM | POA: Diagnosis present

## 2016-05-12 DIAGNOSIS — I129 Hypertensive chronic kidney disease with stage 1 through stage 4 chronic kidney disease, or unspecified chronic kidney disease: Secondary | ICD-10-CM

## 2016-05-12 DIAGNOSIS — J449 Chronic obstructive pulmonary disease, unspecified: Secondary | ICD-10-CM

## 2016-05-12 DIAGNOSIS — F17210 Nicotine dependence, cigarettes, uncomplicated: Secondary | ICD-10-CM

## 2016-05-12 DIAGNOSIS — Z7984 Long term (current) use of oral hypoglycemic drugs: Secondary | ICD-10-CM

## 2016-05-12 DIAGNOSIS — Z7951 Long term (current) use of inhaled steroids: Secondary | ICD-10-CM

## 2016-05-12 DIAGNOSIS — Z79899 Other long term (current) drug therapy: Secondary | ICD-10-CM

## 2016-05-12 DIAGNOSIS — N182 Chronic kidney disease, stage 2 (mild): Secondary | ICD-10-CM

## 2016-05-12 DIAGNOSIS — Z86718 Personal history of other venous thrombosis and embolism: Secondary | ICD-10-CM

## 2016-05-12 HISTORY — DX: Other pulmonary embolism without acute cor pulmonale: I26.99

## 2016-05-12 LAB — CBC
HCT: 36.6 % — ABNORMAL LOW (ref 39.0–52.0)
HEMOGLOBIN: 11.9 g/dL — AB (ref 13.0–17.0)
MCH: 28 pg (ref 26.0–34.0)
MCHC: 32.5 g/dL (ref 30.0–36.0)
MCV: 86.1 fL (ref 78.0–100.0)
PLATELETS: 193 10*3/uL (ref 150–400)
RBC: 4.25 MIL/uL (ref 4.22–5.81)
RDW: 15.1 % (ref 11.5–15.5)
WBC: 18.5 10*3/uL — ABNORMAL HIGH (ref 4.0–10.5)

## 2016-05-12 LAB — BRAIN NATRIURETIC PEPTIDE: B Natriuretic Peptide: 6 pg/mL (ref 0.0–100.0)

## 2016-05-12 LAB — BASIC METABOLIC PANEL
ANION GAP: 10 (ref 5–15)
BUN: 13 mg/dL (ref 6–20)
CALCIUM: 9.1 mg/dL (ref 8.9–10.3)
CO2: 21 mmol/L — AB (ref 22–32)
Chloride: 103 mmol/L (ref 101–111)
Creatinine, Ser: 1.38 mg/dL — ABNORMAL HIGH (ref 0.61–1.24)
GFR calc non Af Amer: 55 mL/min — ABNORMAL LOW (ref >60)
Glucose, Bld: 253 mg/dL — ABNORMAL HIGH (ref 65–99)
Potassium: 4.5 mmol/L (ref 3.5–5.1)
SODIUM: 134 mmol/L — AB (ref 135–145)

## 2016-05-12 LAB — GLUCOSE, CAPILLARY
GLUCOSE-CAPILLARY: 261 mg/dL — AB (ref 65–99)
Glucose-Capillary: 307 mg/dL — ABNORMAL HIGH (ref 65–99)
Glucose-Capillary: 252 mg/dL — ABNORMAL HIGH (ref 65–99)

## 2016-05-12 LAB — TROPONIN I
Troponin I: <0.03 ng/mL (ref <0.03)
Troponin I: <0.03 ng/mL (ref <0.03)

## 2016-05-12 LAB — HEPARIN LEVEL (UNFRACTIONATED): HEPARIN UNFRACTIONATED: 0.69 [IU]/mL (ref 0.30–0.70)

## 2016-05-12 MED ORDER — EDOXABAN TOSYLATE 60 MG PO TABS: 60.0000 mg | ORAL_TABLET | Freq: Every day | ORAL | 4 refills | Status: DC

## 2016-05-12 MED ORDER — AZITHROMYCIN 500 MG PO TABS
500.0000 mg | ORAL_TABLET | Freq: Every day | ORAL | Status: DC
  Filled 2016-05-12: qty 1

## 2016-05-12 MED ORDER — ENOXAPARIN SODIUM 100 MG/ML ~~LOC~~ SOLN: 100.0000 mg | Freq: Two times a day (BID) | SUBCUTANEOUS | 1 refills | Status: DC

## 2016-05-12 MED ORDER — BECLOMETHASONE DIPROPIONATE 40 MCG/ACT IN AERS: 2.0000 | INHALATION_SPRAY | Freq: Two times a day (BID) | RESPIRATORY_TRACT | Status: DC

## 2016-05-12 MED ORDER — PHENOL 1.4 % MT LIQD
1.0000 | OROMUCOSAL | Status: DC | PRN
  Administered 2016-05-12: 1 via OROMUCOSAL
  Filled 2016-05-12: qty 177

## 2016-05-12 MED ORDER — ENOXAPARIN SODIUM 150 MG/ML ~~LOC~~ SOLN: 100.0000 mg | Freq: Two times a day (BID) | SUBCUTANEOUS | 1 refills | Status: DC

## 2016-05-12 MED ORDER — NITROGLYCERIN 0.4 MG SL SUBL: 0.4000 mg | SUBLINGUAL_TABLET | SUBLINGUAL | Status: DC | PRN

## 2016-05-12 MED ORDER — ENOXAPARIN SODIUM 100 MG/ML ~~LOC~~ SOLN
1.0000 mg/kg | Freq: Two times a day (BID) | SUBCUTANEOUS | Status: DC
  Administered 2016-05-12: 100 mg via SUBCUTANEOUS
  Filled 2016-05-12: qty 1

## 2016-05-12 MED ORDER — BECLOMETHASONE DIPROPIONATE 40 MCG/ACT IN AERS
2.0000 | INHALATION_SPRAY | Freq: Two times a day (BID) | RESPIRATORY_TRACT | Status: DC
  Administered 2016-05-12: 2 via RESPIRATORY_TRACT
  Filled 2016-05-12: qty 8.7

## 2016-05-12 MED ORDER — INSULIN ASPART 100 UNIT/ML ~~LOC~~ SOLN
0.0000 [IU] | Freq: Every day | SUBCUTANEOUS | Status: DC
  Administered 2016-05-12: 4 [IU] via SUBCUTANEOUS

## 2016-05-12 MED ORDER — DEXTROMETHORPHAN POLISTIREX ER 30 MG/5ML PO SUER
15.0000 mg | Freq: Two times a day (BID) | ORAL | Status: DC
  Administered 2016-05-12 (×2): 15 mg via ORAL
  Filled 2016-05-12 (×3): qty 5

## 2016-05-12 MED ORDER — ROSUVASTATIN CALCIUM 10 MG PO TABS
10.0000 mg | ORAL_TABLET | Freq: Every day | ORAL | Status: DC
  Administered 2016-05-12: 10 mg via ORAL
  Filled 2016-05-12: qty 1

## 2016-05-12 MED ORDER — AZITHROMYCIN 250 MG PO TABS: ORAL_TABLET | ORAL | 0 refills | Status: DC

## 2016-05-12 MED ORDER — VITAMIN D 1000 UNITS PO TABS
1000.0000 [IU] | ORAL_TABLET | Freq: Every day | ORAL | Status: DC
  Administered 2016-05-12: 1000 [IU] via ORAL
  Filled 2016-05-12: qty 1

## 2016-05-12 MED ORDER — QUETIAPINE FUMARATE 300 MG PO TABS
300.0000 mg | ORAL_TABLET | Freq: Two times a day (BID) | ORAL | Status: DC
  Administered 2016-05-12 (×2): 300 mg via ORAL
  Filled 2016-05-12 (×3): qty 1

## 2016-05-12 MED ORDER — AZITHROMYCIN 500 MG PO TABS: 500.0000 mg | ORAL_TABLET | Freq: Every day | ORAL | 0 refills | Status: DC

## 2016-05-12 MED ORDER — AZITHROMYCIN 250 MG PO TABS: 250.0000 mg | ORAL_TABLET | Freq: Every day | ORAL | Status: DC

## 2016-05-12 MED ORDER — ALBUTEROL SULFATE (2.5 MG/3ML) 0.083% IN NEBU: 2.5000 mg | INHALATION_SOLUTION | Freq: Four times a day (QID) | RESPIRATORY_TRACT | Status: DC | PRN

## 2016-05-12 MED ORDER — CITALOPRAM HYDROBROMIDE 20 MG PO TABS
20.0000 mg | ORAL_TABLET | Freq: Every day | ORAL | Status: DC
  Administered 2016-05-12: 20 mg via ORAL
  Filled 2016-05-12: qty 1

## 2016-05-12 MED ORDER — VALACYCLOVIR HCL 500 MG PO TABS
500.0000 mg | ORAL_TABLET | Freq: Every day | ORAL | Status: DC
  Administered 2016-05-12: 500 mg via ORAL
  Filled 2016-05-12: qty 1

## 2016-05-12 MED ORDER — INSULIN ASPART 100 UNIT/ML ~~LOC~~ SOLN
0.0000 [IU] | Freq: Three times a day (TID) | SUBCUTANEOUS | Status: DC
Start: 2016-05-12 — End: 2016-05-12
  Administered 2016-05-12: 5 [IU] via SUBCUTANEOUS

## 2016-05-12 MED ORDER — SODIUM CHLORIDE 0.9% FLUSH
3.0000 mL | Freq: Two times a day (BID) | INTRAVENOUS | Status: DC
Start: 2016-05-12 — End: 2016-05-12

## 2016-05-12 MED ORDER — TIOTROPIUM BROMIDE MONOHYDRATE 18 MCG IN CAPS
18.0000 ug | ORAL_CAPSULE | Freq: Every day | RESPIRATORY_TRACT | Status: DC
  Administered 2016-05-12: 18 ug via RESPIRATORY_TRACT
  Filled 2016-05-12: qty 5

## 2016-05-12 MED ORDER — GABAPENTIN 300 MG PO CAPS
600.0000 mg | ORAL_CAPSULE | Freq: Every day | ORAL | Status: DC
  Administered 2016-05-12: 600 mg via ORAL
  Filled 2016-05-12: qty 2

## 2016-05-12 MED ORDER — LISINOPRIL 5 MG PO TABS
5.0000 mg | ORAL_TABLET | Freq: Every day | ORAL | Status: DC
  Administered 2016-05-12: 5 mg via ORAL
  Filled 2016-05-12: qty 1

## 2016-05-12 MED ORDER — ACETAMINOPHEN 650 MG RE SUPP: 650.0000 mg | Freq: Four times a day (QID) | RECTAL | Status: DC | PRN

## 2016-05-12 MED ORDER — DARUNAVIR ETHANOLATE 800 MG PO TABS
800.0000 mg | ORAL_TABLET | Freq: Every day | ORAL | Status: DC
  Administered 2016-05-12: 800 mg via ORAL
  Filled 2016-05-12: qty 1

## 2016-05-12 MED ORDER — ACETAMINOPHEN 325 MG PO TABS
650.0000 mg | ORAL_TABLET | Freq: Four times a day (QID) | ORAL | Status: DC | PRN
  Administered 2016-05-12: 650 mg via ORAL
  Filled 2016-05-12: qty 2

## 2016-05-12 MED ORDER — ELVITEG-COBIC-EMTRICIT-TENOFAF 150-150-200-10 MG PO TABS
1.0000 | ORAL_TABLET | Freq: Every day | ORAL | Status: DC
  Administered 2016-05-12: 1 via ORAL
  Filled 2016-05-12: qty 1

## 2016-05-12 MED FILL — AZITHROMYCIN 250 MG TABLET: 250 | 5 days supply | Qty: 6 | Fill #0

## 2016-05-12 MED FILL — ENOXAPARIN 100 MG/ML SYRN: 100 | 5 days supply | Qty: 10 | Fill #0

## 2016-05-12 NOTE — Progress Notes (Addendum)
[place] for heparin > Lovenox Indication: pulmonary embolus  No Known Allergies  Patient Measurements: Height: 6' (182.9 cm) Weight: 221 lb 14.4 oz (100.7 kg) IBW/kg (Calculated) : 77.6 Heparin Dosing Weight: 100kg  Vital Signs: Temp: 97.6 F (36.4 C) (10/24 0500) Temp Source: Oral (10/24 0500) BP: 142/76 (10/24 0500) Pulse Rate: 84 (10/24 0500)  Labs:  Recent Labs  05/11/16 2004 05/12/16 0100 05/12/16 0532  HGB 12.0*  --  11.9*  HCT 36.4*  --  36.6*  PLT 174  --  193  HEPARINUNFRC  --   --  0.69  CREATININE 1.36*  --  1.38*  TROPONINI  --  <0.03 <0.03    Estimated Creatinine Clearance: 71.6 mL/min (by C-G formula based on SCr of 1.38 mg/dL (H)).   Medical History: Past Medical History:  Diagnosis Date  . Calcium oxalate crystals in urine 12/23/2011   Asymptomatic, no hematuria. Advised to take plenty of water.  . Depression   . Diabetes mellitus without complication ([place])    pre diabetic  . Hepatitis C   . HIV (human immunodeficiency virus infection) ([place])   . Hypertension   . Peripheral arterial disease ([place])    ooccluded left SFA by duplex ultrasound, tibial disease left greater than right  . Stroke ([place]) 11/2011   Carotids Doppler negative. Right sided weakness resolved, initially on Plavix for 3 months and then continued with ASA.    . TB lung, latent 1988   Treated    Assessment: 60yo male with HIV on Genvoya + darunavir c/o left-sided CP. CT reveals acute PE w/ evidence of RHS. Pharmacy consulted to transition from heparin to Lovenox, with the plan to transition to Edoxaban after 5-10 days of parenteral therapy (not ordered yet, MD to f/u with patient in clinic later this week). Edoxaban chosen due to lack of interaction documented with HIV medications. CrCl~70-75, CBC stable, no bleed documented.  Goal of Therapy:  Anti-Xa level 0.6-1 units/ml 4hrs after LMWH dose given Monitor platelets by anticoagulation  protocol: Yes   Plan:  Lovenox 100mg  [place] Q12h - start 1 hour after heparin drip d/c'd (communicated w/ RN) Monitor CBC, renal function, s/sx bleeding Plan to transition to Edoxaban after 5-10 days of parenteral therapy, patient to f/u in clinic  Elicia Lamp, PharmD, BCPS Clinical Pharmacist 05/12/2016 8:50 AM

## 2016-05-12 NOTE — Discharge Summary (Signed)
Name: Jacob Joseph MRN: [id number] DOB: May 16, 1956 60 y.o. PCP: Burgess Estelle, MD  Date of Admission: 05/11/2016  7:24 PM Date of Discharge: 05/12/2016 Attending Physician: Aldine Contes, MD  Discharge Diagnosis: 1. Pulmonary embolism 2. Community-acquired pneumonia   Important Please address the patient's anticoagulation. Obtaining his medications was a problem in the inpatient setting and we worked very hard with pharmacy to arrange a plan for him to get these medications as needed. We sent prescriptions electronically and provided him and his wife with paper prescriptions that they could fill if they needed to use a separate pharmacy. He will need a minimum of 5 days of enoxaparin and then will need to be started on edoxaban. I called the patients insurer (or who social work told me to call) to get prior authorization for his edoxaban. Please follow-up with this at his outpatient visit. If he is unable to get this medication as prescribed please switch him to another anticoagulant to ensure he remains therapeutic for the treatment of his pulmonary embolism. This is his second thrombotic event and he will require lifelong anticoagulation.  Discharge Medications:   Medication List    STOP taking these medications   fluticasone 50 MCG/ACT nasal spray Commonly known as:  FLONASE   loratadine 10 MG tablet Commonly known as:  CLARITIN   sildenafil 20 MG tablet Commonly known as:  REVATIO   varenicline 0.5 MG X 11 & 1 MG X 42 tablet Commonly known as:  CHANTIX STARTING MONTH PAK     TAKE these medications   aspirin 81 MG EC tablet Take 1 tablet (81 mg total) by mouth daily.   azithromycin 500 MG tablet Commonly known as:  ZITHROMAX Take 1 tablet (500 mg total) by mouth daily.   azithromycin 250 MG tablet Commonly known as:  ZITHROMAX Take 500mg  on day 1 and 250mg  daily for four days. Start taking on:  05/13/2016   beclomethasone 40 MCG/ACT inhaler Commonly known  as:  QVAR Inhale 2 puffs into the lungs 2 (two) times daily. What changed:  when to take this   cholecalciferol 1000 units tablet Commonly known as:  VITAMIN D Take 1 tablet (1,000 Units total) by mouth daily.   citalopram 20 MG tablet Commonly known as:  CELEXA Take 20 mg by mouth daily.   Darunavir Ethanolate 800 MG tablet Commonly known as:  PREZISTA TAKE 1 TABLET BY MOUTH EVERY DAY WITH BREAKFAST   diclofenac sodium 1 % Gel Commonly known as:  VOLTAREN Apply 4 g topically 4 (four) times daily. What changed:  when to take this  reasons to take this   edoxaban 60 MG Tabs tablet Commonly known as:  SAVAYSA Take 60 mg by mouth daily. Start taking on:  05/16/2016   enoxaparin 100 MG/ML injection Commonly known as:  LOVENOX Inject 1 mL (100 mg total) into the skin every 12 (twelve) hours.   gabapentin 300 MG capsule Commonly known as:  NEURONTIN Take 2 capsules (600 mg total) by mouth at bedtime.   GENVOYA 150-150-200-10 MG Tabs tablet Generic drug:  elvitegravir-cobicistat-emtricitabine-tenofovir TAKE 1 TABLET BY MOUTH DAILY WITH BREAKFAST   hydroxypropyl methylcellulose / hypromellose 2.5 % ophthalmic solution Commonly known as:  ISOPTO TEARS / GONIOVISC Place 1 drop into both eyes as needed for dry eyes.   lisinopril 5 MG tablet Commonly known as:  PRINIVIL,ZESTRIL Take 1 tablet (5 mg total) by mouth daily.   metFORMIN 500 MG tablet Commonly known as:  GLUCOPHAGE Take 1 tablet (500 mg  total) by mouth daily with breakfast.   PROAIR HFA 108 (90 Base) MCG/ACT inhaler Generic drug:  albuterol INHALE 2 PUFFS INTO THE LUNGS EVERY 6 HOURS AS NEEDED FOR WHEEZING OR SHORTNESS OF BREATH, TO NOT USE EVERY DAY, USE ONLY IF NEEDED   QUEtiapine 300 MG tablet Commonly known as:  SEROQUEL Take 300 mg by mouth 2 (two) times daily.   rosuvastatin 10 MG tablet Commonly known as:  CRESTOR Take 1 tablet (10 mg total) by mouth at bedtime.   sildenafil 50 MG  tablet Commonly known as:  VIAGRA Take 1 tablet (50 mg total) by mouth daily as needed for erectile dysfunction.   tiotropium 18 MCG inhalation capsule Commonly known as:  SPIRIVA HANDIHALER INHALE THE CONTENTS OF 1 CASPSULE USING HANDIHALER EVERY DAY   valACYclovir 500 MG tablet Commonly known as:  VALTREX TAKE 1 TABLET BY MOUTH DAILY       Disposition and follow-up:   Jacob Joseph was discharged from [facility] in Good condition.  At the hospital follow up visit please address:  1.  Please ensure the patient is taking his anticoagulation as prescribed. Additionally, please ensure the patient is able to get his edoxaban as prescribed.   2.  Labs / imaging needed at time of follow-up: CBC  3.  Pending labs/ test needing follow-up: None  Follow-up Appointments:   Hospital Course by problem list:  1. Pulmonary embolism The patient presented to the [facility] emergency department on 05/11/2016 with chest pain and shortness of breath. He first noted the chest pain upon waking up in the morning. He stated the chest pain is located in the left side of his chest and was worse with inspiration. In the emergency department a workup was done and an EKG did not show acute ST segment elevation. Troponins were negative. CT angiography and chest revealed a pulmonary embolism. He was started on heparin and then admitted to the [facility] internal medicine teaching service for further workup and management of an acute pulmonary embolism. Importantly, the patient has had a history of DVT and has been on anticoagulation for over a year. At the time of presentation he was not currently on anticoagulation. Because this is the patient's second thrombotic event he will benefit from lifelong anticoagulation. Once inpatient the patient's chest pain and shortness of breath entirely resolved. He was then switched to enoxaparin which he will be on for 5-7 days and then will switch to  edoxaban. Edoxaban will be the best choice in this patient given his multi-other medications including therapy for HIV and his renal function. He will need to be on a medication for life. I have already filed for the preauthorization for the patient to get this medication.At  his post hospital follow-up please ensure that he is still taking the enoxaparin and the he will be able to get the edoxaban. It will be imperative that he gets this medication and if he is unable to please ensure he continues on appropriate anticoagulation to treat his pulmonary embolism. On the day of discharge his chest pain and shortness of breath had resolved.  2. Community-acquired pneumonia On admission the patient had a one-week history of progressive cough with sputum production. He did not have fevers but did have a leukocytosis. CT angiography obtained  for pulmonary embolus showed infiltrates that are consistent with pneumonia. Given the patient's cough has worsened over the past week and that he is producing sputum and has a leukocytosis we are  planning to treat him for community-acquired pneumonia. He will have a 5 day course of azithromycin. At his hospital follow-up please address his cough and order a CBC to evaluate for resolution of his leukocytosis. During his hospitalization he remained afebrile however based on his clinical picture, leukocytosis and imaging findings we thought the best course was to treat the patient with 5 days of antibiotics for community-acquired pneumonia. At his hospital follow-up visit please ensure that his cough has improved and that he has finished his antibiotics.    Discharge Vitals:   BP 138/75 (BP Location: Right Arm)   Pulse 89   Temp 97.8 F (36.6 C) (Oral)   Resp 19   Ht 6' (1.829 m)   Wt 221 lb 14.4 oz (100.7 kg)   SpO2 93%   BMI 30.10 kg/m   Pertinent Labs, Studies, and Procedures:   CT angiography chest 1. Findings consistent with acute pulmonary embolus  involving anterior segmental branch of left upper lobe. RV LV ratio is 1, consistent with right heart strain. Positive for acute PE with CT evidence of right heart strain (RV/LV Ratio = 1) consistent with at least submassive (intermediate risk) PE. The presence of right heart strain has been associated with an increased risk of morbidity and mortality. Please activate Code PE by paging [phone]. Critical Value/emergent results were called by telephone at the time of interpretation on 05/11/2016 at 10:44 pm to Dr. Janetta Hora , who verbally acknowledged these results. 2. Hazy and streaky airspace disease within the bilateral lower lobes, right middle lobe and peripheral left upper lobe, edema versus infiltrates.  Discharge Instructions: Discharge Instructions    Diet - low sodium heart healthy    Complete by:  As directed    Discharge instructions    Complete by:  As directed    I have started you on an antibiotic called azithromycin. You are to take this as prescribed for a total of 5 days. This is for the treatment of a community-acquired pneumonia.  Additionally, we have started you on a new blood thinner to treat your blood clot in your lung. We will discuss with you how to take this medication in person. Additionally, please discuss this with your doctor that you will see in the clinic.  I will arrange an appointment for you to be seen in the [facility] internal medicine clinic for later this week. Please ensure that you follow up with this appointment.   Increase activity slowly    Complete by:  As directed       Signed: Ophelia Shoulder, MD 05/12/2016, 3:36 PM   Pager: [phone]

## 2016-05-12 NOTE — Progress Notes (Signed)
Inpatient Diabetes Program Recommendations  AACE/ADA: New Consensus Statement on Inpatient Glycemic Control (2015)  Target Ranges:  Prepandial:   less than 140 mg/dL      Peak postprandial:   less than 180 mg/dL (1-2 hours)      Critically ill patients:  140 - 180 mg/dL   Lab Results  Component Value Date   GLUCAP 261 (H) 05/12/2016   HGBA1C 6.4 03/16/2016    Review of Glycemic ControlResults for IRVINE, MORDEN (MRN [id number]) as of 05/12/2016 13:23  Ref. Range 05/12/2016 01:46 05/12/2016 06:29 05/12/2016 10:57  Glucose-Capillary Latest Ref Range: 65 - 99 mg/dL 307 (H) 252 (H) 261 (H)   Diabetes history: Type 2 diabetes Outpatient Diabetes medications: Metformin 500 mg daily Current orders for Inpatient glycemic control:  Novolog sensitive tid with meals and HS  Inpatient Diabetes Program Recommendations:    Please consider adding Levemir 20 units daily while patient is in the hospital.    Thanks, Adah Perl, RN, BC-ADM Inpatient Diabetes Coordinator Pager [phone] (8a-5p)

## 2016-05-12 NOTE — Progress Notes (Signed)
Subjective: No acute events overnight. Patient's chest pain and shortness of breath have improved. He denies nausea, vomiting or abdominal pain. He was extremely sleepy on examination this morning and had no additional questions.  Objective:  Vital signs in last 24 hours: Vitals:   05/12/16 0500 05/12/16 0857 05/12/16 0858 05/12/16 1015  BP: (!) 142/76   (!) 147/88  Pulse: 84     Resp: 20     Temp: 97.6 F (36.4 C)     TempSrc: Oral     SpO2: 98% 93% 93%   Weight:      Height:       Physical Exam  Constitutional: He appears well-developed and well-nourished.  In no acute distress, somnolent on examination  HENT:  Head: Normocephalic and atraumatic.  Cardiovascular: Normal rate and regular rhythm.  Exam reveals no gallop and no friction rub.   No murmur heard. Respiratory: Effort normal and breath sounds normal. No respiratory distress. He has no wheezes.  GI: Soft. Bowel sounds are normal. He exhibits distension.  Abdomen seemed distended but was not tender to palpation.  Musculoskeletal: He exhibits edema.  Right calf edema. Not tender on palpation.  Skin:  Diaphoretic     Assessment/Plan:  1.Acute pulmonary Embolism: Patient presented with acute chest pain, tachycardia and shortness of breath.  He was not hypoxic on admission.  Patient has a history of DVT in his right leg and placed on coumadin for one year and is not currently on anticoagulation.Today CT angiogram revealed an acute pulmonary embolus involving the anterior segmental branch of left upper lobe and RV LV ratio consistent with right heart strain. His chest pain and shortness of breath have resolved. His troponins were negative. The patient will need lifelong anticoagulation for a second thrombotic event. Due to medication interactions with his antiretroviral therapies and his kidney function we will use edoxaban with an enoxaparin bridge. -- Enoxaparin dosing per pharmacy for 5 days, then initiate  edoxaban  2. CAP Patient has had a productive cough for one week. This cough has increased in intensity over the last several days. Additionally, on his chest CT there was concern for right middle lobe and peripheral lobe infiltrate. Given the patient's history of a productive cough over the last week, increased fatigue, CT findings and leukocytosis I think the most likely etiology to explain the patient's cough and imaging is community-acquired pneumonia. Based on the imaging findings I am most concerned for an atypical microorganism such as mycobacterium. I will start the patient on a 5 day course of azithromycin. He will have follow-up in the [facility] internal medicine teaching clinic. -- Z-Pak  2.History of DVT Patient with a second thrombotic event. Prior DVT requiring anticoagulation for a year. Now presents with acute pulmonary emnolism. He will need lifelong anticoagulation. We will anticoagulate as above under point #1.     3. HIV Follows with Dr. Megan Salon and is on [name] and [place]. In 01/15/16 his CD4 T Cells were 670 and HIV 1 RNA was not detected.  He is compliant with his medications and under well control. - continue HIV medications  4. COPD Stable. No wheezing on exam.  Patient takes Qvar and Spiriva - continue home medications  5. CKD stage II Patient has a creatinine of 1.3 that appears is his baseline. - Continuing lisinopril - BMET in the morning    6. Type II Diabetes Mellitus Last Hgb A1C was 6.4 and under well control.  Pateint is on metformin and  gabapentin - continue home medications  7. Hypertension Mildly elevated.  Probably due to current PE.  Patient takes lisinopril - continue home medications   Dispo: Anticipated discharge this afternoon or tomorrow.   Ophelia Shoulder, MD 05/12/2016, 11:46 AM Pager: [phone]

## 2016-05-12 NOTE — Care Management Obs Status (Signed)
[place] NOTIFICATION   Patient Details  Name: Jacob Joseph MRN: [id number] Date of Birth: [birth year]   Medicare Observation Status Notification Given:  Yes    Erenest Rasher, RN 05/12/2016, 4:04 PM

## 2016-05-12 NOTE — Care Management CC44 (Signed)
Condition Code 44 Documentation Completed  Patient Details  Name: Jacob Joseph MRN: [id number] Date of Birth: 14-Apr-1956   Condition Code 44 given:  yes Patient signature on Condition Code 44 notice:  yes Documentation of 2 MD's agreement:  yes Code 44 added to claim:  yes    Erenest Rasher, RN 05/12/2016, 3:58 PM

## 2016-05-12 NOTE — Progress Notes (Signed)
Patient arrived on the unit from the ER assessment completed see flowsheet , placed on tele ccmd notified, patient oriented to room and staff, bed in lowest position, non skid socks on, call light within reach will continue to monitor

## 2016-05-12 NOTE — Care Management Note (Addendum)
Case Management Note  Patient Details  Name: Jacob Joseph MRN: [id number] Date of Birth: May 17, 1956  Subjective/Objective:   Acute PE, CAP, COPD               Action/Plan: Discharge Planning: AVS reviewed:   NCM spoke to pt and wife, Jacob Joseph at bedside. Pt states he does not have a copay for his medications. Pt has Medicare and Medicaid. Pt reports [facility] delivers his medications to his home. Pt reports they do not drive. Has Medicaid transportation for his appts. [name] Savaysa needs a prior [place], [phone], pts ID [id number]. Made attending aware prior auth needed. Provided attending with prior auth information to call. Wife states they can use [facility]. Faxed Rx to [facility]. He will need Lovenox tonight.   PCP- Tiburcio Pea, Orson Slick MD  05/12/2016 Prior Josem Kaufmann is complete. NCM spoke to [facility] and they would have to order [facility] and it takes 1 to 2 days to get medication in stock and then would have to deliver to his home. They do not deliver on weekends. This would put medication arriving on weekend. [place] and they have 30 pills in stock and could order the other amount. NCM made pt's wife aware. Faxed Rx to [facility], they have in stock. She will pick up medication.   Expected Discharge Date:   05/12/2016              Expected Discharge Plan:  Home/Self Care  In-House Referral:  NA  Discharge planning Services  CM Consult  Post Acute Care Choice:  NA Choice offered to:  NA  DME Arranged:  N/A DME Agency:  NA  HH Arranged:  NA HH Agency:  NA  Status of Service:  Completed, signed off  If discussed at [facility] of Stay Meetings, dates discussed:    Additional Comments:  Erenest Rasher, RN 05/12/2016, 2:21 PM

## 2016-05-13 ENCOUNTER — Other Ambulatory Visit: Payer: Self-pay | Admitting: *Deleted

## 2016-05-13 ENCOUNTER — Telehealth: Payer: Self-pay | Admitting: Internal Medicine

## 2016-05-13 MED ORDER — EDOXABAN TOSYLATE 60 MG PO TABS: 60.0000 mg | ORAL_TABLET | Freq: Every day | ORAL | 4 refills | Status: DC

## 2016-05-13 MED FILL — SAVAYSA 60 MG TABLET: 60 | 30 days supply | Qty: 30 | Fill #0

## 2016-05-13 NOTE — Progress Notes (Addendum)
Received call from [place] regarding prior auth for [facility]. PA # [id number] is pending approval. Will take 24-48 hours. Attempted call to pt's to make aware. No answer and unable to leave message on voicemail. Jonnie Finner RN CCM Case Mgmt phone [phone]  05/13/2016 [phone] Received call back from [facility] and PA was approved. [place] to make aware. Notified pt's wife that PA was approved. Jonnie Finner RN CCM Case Mgmt phone [phone]

## 2016-05-13 NOTE — Telephone Encounter (Signed)
Jeneen Rinks, I see you have worked hard to make sure this patient can get Edoxaban after discharge. For some reason his pharmacy sent our clinic a refill request today. I see you just prescribed this a few different ways. Would you please give him a call and see if there has been a problem obtaining the medicine?

## 2016-05-13 NOTE — Consult Note (Signed)
            [facility] Primary Care Navigator  05/13/2016  Jacob Joseph [birth year] [id number]  Wentto see patient in his room for possible discharge needs but he was discharged home yesterday.  Primary care provider's office contacted Tamela Oddi S.)to notify of patient's discharge and need for post hospital follow-up and transition of care. Informed to place a referral to [facility] care management for care coordination needs if deemed appropriate.  For additional questions please contact:  Edwena Felty A. Shalayna Ornstein, BSN, RN-BC [facility] PRIMARY CARE Navigator Cell: [phone]

## 2016-05-13 NOTE — Telephone Encounter (Signed)
I spoke with the patient's wife regarding his anticoagulation. We have ensured that the prior authorization has been accepted. I worked again with pharmacy today and he should have access to this medication. Please ensure he is able to get this at his posthospitalization follow-up.

## 2016-05-13 NOTE — Telephone Encounter (Signed)
Received refill request from [facility] in [place], [place] for pt's savaysa 60mg  tabs. Pt recently discharged from hospital.  Will forward request to attending, as pt's pcp is currently unavailable.  Please advise.Regenia Skeeter, Radha Coggins Cassady10/25/201710:21 AM

## 2016-05-14 ENCOUNTER — Ambulatory Visit (INDEPENDENT_AMBULATORY_CARE_PROVIDER_SITE_OTHER): Payer: Medicare Other | Admitting: Internal Medicine

## 2016-05-14 ENCOUNTER — Telehealth: Payer: Self-pay

## 2016-05-14 VITALS — BP 153/87 | HR 113 | Temp 98.1°F | Wt 230.5 lb

## 2016-05-14 DIAGNOSIS — Z7982 Long term (current) use of aspirin: Secondary | ICD-10-CM | POA: Diagnosis not present

## 2016-05-14 DIAGNOSIS — B1710 Acute hepatitis C without hepatic coma: Secondary | ICD-10-CM | POA: Diagnosis not present

## 2016-05-14 DIAGNOSIS — B20 Human immunodeficiency virus [HIV] disease: Secondary | ICD-10-CM | POA: Diagnosis not present

## 2016-05-14 DIAGNOSIS — Z5189 Encounter for other specified aftercare: Secondary | ICD-10-CM | POA: Diagnosis not present

## 2016-05-14 DIAGNOSIS — Z86718 Personal history of other venous thrombosis and embolism: Secondary | ICD-10-CM | POA: Diagnosis not present

## 2016-05-14 DIAGNOSIS — Z7901 Long term (current) use of anticoagulants: Secondary | ICD-10-CM | POA: Diagnosis not present

## 2016-05-14 DIAGNOSIS — F17210 Nicotine dependence, cigarettes, uncomplicated: Secondary | ICD-10-CM

## 2016-05-14 DIAGNOSIS — I2699 Other pulmonary embolism without acute cor pulmonale: Secondary | ICD-10-CM | POA: Diagnosis not present

## 2016-05-14 DIAGNOSIS — Z23 Encounter for immunization: Secondary | ICD-10-CM | POA: Diagnosis not present

## 2016-05-14 DIAGNOSIS — J189 Pneumonia, unspecified organism: Secondary | ICD-10-CM | POA: Diagnosis not present

## 2016-05-14 MED ORDER — NICOTINE 14 MG/24HR TD PT24: 14.0000 mg | MEDICATED_PATCH | TRANSDERMAL | 0 refills | Status: AC

## 2016-05-14 NOTE — Assessment & Plan Note (Signed)
Pleuritic chest pain and dyspnea precipitated discovery of segmental PE in left upper lobe on CTA with evidence of R heart strain by RV/LV ratio.  Since discharge, he reports symptomatic improvement and compliance with lovenox anticoagulation.  Today, he is tachycardic and still has pleuritic chest pain, but is saturating well.  He has reduced breath sounds in LLL, most likely due to atelectasis from splinting or possibly pleural effusion secondary to PE.  Less likely is parapneumonic effusion, as he does not seem infected.  This is his second unprovoked thromboembolic event, and warrants indefinite anticoagulation. -Complete 5 days lovenox prescribed at discharge -Edoxaban 60 mg daily after lovenox

## 2016-05-14 NOTE — Progress Notes (Signed)
   CC: "I was in the hospital for a blood clot"  HPI:  [name] is a 60 y.o. man with history of HTN, COPD, DM2, HIV (last CD4 52), and recurrent spontaneous thromboembolic disease who presents for hospital follow-up of pulmonary embolism.  He was admitted 10/23-10/24 for chest pain and shortness of breath, and was found to have a PE on CTA.  He was also treated for pneumonia with 5 days of azithromycin.  CT chest showed pulmonary infiltrates and he had WBC up to 18.5, but he remained afebrile.  Since leaving the hospital, he has had persistent sharp L pleuritic CP, 3/10 in intensity, but improved SOB.  Has been giving himself lovenox BID without issues.  Has successfully filled his Edoxoban prescription, and  2 more days of azithromycin.  2 years ago he had an unprovoked RLE DVT.  Anticoagulated on warfarin for 1 year, off anticoagulation for the past year.  Notes he has been waking up in the morning with a sweaty head.  Not sweaty all over, not soaking clothes or sheets.  Was smoking 0.5 ppd, now 1 cig per day since discharge, wants to quit and would like nicotine patches.  Past Medical History:  Diagnosis Date  . Calcium oxalate crystals in urine 12/23/2011   Asymptomatic, no hematuria. Advised to take plenty of water.  . Depression   . Diabetes mellitus without complication ([place])    pre diabetic  . Hepatitis C   . HIV (human immunodeficiency virus infection) ([place])   . Hypertension   . Peripheral arterial disease ([place])    ooccluded left SFA by duplex ultrasound, tibial disease left greater than right  . Stroke ([place]) 11/2011   Carotids Doppler negative. Right sided weakness resolved, initially on Plavix for 3 months and then continued with ASA.    . TB lung, latent 1988   Treated    Review of Systems:   Review of Systems  Constitutional: Negative for chills and fever.  HENT: Negative for nosebleeds.   Respiratory: Positive for shortness of breath. Negative for cough and  hemoptysis.   Cardiovascular: Positive for chest pain. Negative for palpitations and leg swelling.  Gastrointestinal: Negative for blood in stool and melena.  Endo/Heme/Allergies: Does not bruise/bleed easily.   Physical Exam:  Vitals:   05/14/16 1340  BP: (!) 153/87  Pulse: (!) 113  Temp: 98.1 F (36.7 C)  TempSrc: Oral  SpO2: 97%  Weight: 230 lb 8 oz (104.6 kg)    Physical Exam  Constitutional: He is oriented to person, place, and time. He appears well-developed and well-nourished. No distress.  Cardiovascular:  Tachycardic, regular rhythm No murmur, normal S1S2  Pulmonary/Chest:  Reduced breath sounds left lower lung field Good air movement, no increased WOB No crackles or wheezes  Neurological: He is alert and oriented to person, place, and time.  Psychiatric: He has a normal mood and affect. His behavior is normal.     Assessment & Plan:   See Encounters Tab for problem based charting.  Patient seen with Dr. Eppie Gibson

## 2016-05-14 NOTE — Assessment & Plan Note (Addendum)
Bilateral pulmonary infiltrates noted incidentally on CTA chest and WBC count to 18.  Empiric treatment for CAP begun before discharge.  Today he is afebrile and without dyspnea, but has reduced breath sounds in LLL.  Recent advanced chest imaging did not show consolidation.  Atelectasis from splinting or pleural effusion secondary to PE seem more likely than a parapneumonic effusion. -Finish 5 days of azithro -Repeat CBC today

## 2016-05-14 NOTE — Telephone Encounter (Signed)
Transition Care Management Follow-up Telephone Call   Date discharged? 05/12/2016   How have you been since you were released from the hospital? Patient says that he has experienced no complications and has been taking his Lovenox as instructed.    Do you understand why you were in the hospital? yes   Do you understand the discharge instructions? Yes- take the shots twice daily at the same times each day (morning and evening)   Where were you discharged to? Home   Items Reviewed:  Medications reviewed: YES  Allergies reviewed: YES  Dietary changes reviewed: NO  Referrals reviewed: YES  Functional Questionnaire:   Activities of Daily Living (ADLs):   He states they are independent in the following: ambulation, bathing and hygiene, feeding, continence, grooming, toileting and dressing States they require no assistance with the following   Any transportation issues/concerns?: no   Any patient concerns? Yes, only concern was that he wanted edoxaban filled through [facility] in [place], [place].    Confirmed importance and date/time of follow-up visits scheduled yes  Provider Appointment booked with  Confirmed with patient if condition begins to worsen call PCP or go to the ER.  Patient was given the office number and encouraged to call back with question or concerns.  : yes

## 2016-05-14 NOTE — Patient Instructions (Addendum)
You were seen today in clinic to follow up on the blood clot in your lungs (pulmonary embolism) that put you in the hospital.  It looks  Keep taking the lovenox injections until they run out then start taking the Savaysa (edoxaban) pills once a day.  These are blood thinning medicines to prevent you from having any further blood clots.  Since you have now had 2 blood clots, you will need blood thinners for the rest of your life.  Finish taking the antibiotics you were prescribed in the hospital too.  Keep working on quitting smoking!  With your COPD and blood clots, you need to do everything you can to help your lungs, and avoid cigarettes is the best thing you can do.  If you have worse chest pain, shortness of breath, or fevers, please call 911 and go the ED if it's an emergency.  If your shortness of breath or cough get worse but it isn't an emergency, you can also call the clinic for an appointment.  You have an appointment schedule with Dr Tiburcio Pea on 11/13.

## 2016-05-15 ENCOUNTER — Encounter: Payer: Self-pay | Admitting: Internal Medicine

## 2016-05-15 LAB — CBC WITH DIFFERENTIAL/PLATELET
BASOS: 0 %
Basophils Absolute: 0 10*3/uL (ref 0.0–0.2)
EOS (ABSOLUTE): 0.1 10*3/uL (ref 0.0–0.4)
EOS: 1 %
HEMATOCRIT: 39.2 % (ref 37.5–51.0)
HEMOGLOBIN: 12.8 g/dL (ref 12.6–17.7)
Immature Grans (Abs): 0 10*3/uL (ref 0.0–0.1)
Immature Granulocytes: 0 %
LYMPHS ABS: 4.6 10*3/uL — AB (ref 0.7–3.1)
Lymphs: 34 %
MCH: 27.8 pg (ref 26.6–33.0)
MCHC: 32.7 g/dL (ref 31.5–35.7)
MCV: 85 fL (ref 79–97)
MONOCYTES: 13 %
MONOS ABS: 1.7 10*3/uL — AB (ref 0.1–0.9)
NEUTROS ABS: 7 10*3/uL (ref 1.4–7.0)
Neutrophils: 52 %
Platelets: 214 10*3/uL (ref 150–379)
RBC: 4.61 x10E6/uL (ref 4.14–5.80)
RDW: 14.7 % (ref 12.3–15.4)
WBC: 13.5 10*3/uL — ABNORMAL HIGH (ref 3.4–10.8)

## 2016-05-18 NOTE — Progress Notes (Signed)
I saw and evaluated the patient. I personally confirmed the key portions of Dr. Rowe Pavy history and exam and reviewed pertinent patient test results. The assessment, diagnosis, and plan were formulated together and I agree with the documentation in the resident's note.

## 2016-05-25 ENCOUNTER — Encounter: Payer: Medicare Other | Admitting: Internal Medicine

## 2016-05-29 ENCOUNTER — Telehealth: Payer: Self-pay | Admitting: Internal Medicine

## 2016-05-29 NOTE — Telephone Encounter (Signed)
APT. REMINDER CALL, LMTCB °

## 2016-05-31 NOTE — Telephone Encounter (Signed)
Patient was contacted with Frank Tillman, PharmD candidate. I agree with the assessment and plan of care documented.  

## 2016-06-01 ENCOUNTER — Ambulatory Visit (INDEPENDENT_AMBULATORY_CARE_PROVIDER_SITE_OTHER): Payer: Medicare Other | Admitting: Internal Medicine

## 2016-06-01 ENCOUNTER — Encounter: Payer: Self-pay | Admitting: Internal Medicine

## 2016-06-01 DIAGNOSIS — F17210 Nicotine dependence, cigarettes, uncomplicated: Secondary | ICD-10-CM

## 2016-06-01 DIAGNOSIS — Z7901 Long term (current) use of anticoagulants: Secondary | ICD-10-CM

## 2016-06-01 DIAGNOSIS — E119 Type 2 diabetes mellitus without complications: Secondary | ICD-10-CM

## 2016-06-01 DIAGNOSIS — F1420 Cocaine dependence, uncomplicated: Secondary | ICD-10-CM | POA: Diagnosis not present

## 2016-06-01 DIAGNOSIS — I10 Essential (primary) hypertension: Secondary | ICD-10-CM

## 2016-06-01 DIAGNOSIS — Z79899 Other long term (current) drug therapy: Secondary | ICD-10-CM | POA: Diagnosis not present

## 2016-06-01 DIAGNOSIS — F1910 Other psychoactive substance abuse, uncomplicated: Secondary | ICD-10-CM

## 2016-06-01 DIAGNOSIS — Z7984 Long term (current) use of oral hypoglycemic drugs: Secondary | ICD-10-CM | POA: Diagnosis not present

## 2016-06-01 DIAGNOSIS — I2699 Other pulmonary embolism without acute cor pulmonale: Secondary | ICD-10-CM | POA: Diagnosis present

## 2016-06-01 DIAGNOSIS — E118 Type 2 diabetes mellitus with unspecified complications: Secondary | ICD-10-CM

## 2016-06-01 DIAGNOSIS — Z7982 Long term (current) use of aspirin: Secondary | ICD-10-CM

## 2016-06-01 LAB — GLUCOSE, CAPILLARY: Glucose-Capillary: 78 mg/dL (ref 65–99)

## 2016-06-01 MED ORDER — EDOXABAN TOSYLATE 60 MG PO TABS: 60.0000 mg | ORAL_TABLET | Freq: Every day | ORAL | 4 refills | Status: DC

## 2016-06-01 MED ORDER — BECLOMETHASONE DIPROPIONATE 40 MCG/ACT IN AERS: 2.0000 | INHALATION_SPRAY | Freq: Two times a day (BID) | RESPIRATORY_TRACT | 2 refills | Status: DC

## 2016-06-01 MED ORDER — TIOTROPIUM BROMIDE MONOHYDRATE 18 MCG IN CAPS: ORAL_CAPSULE | RESPIRATORY_TRACT | 12 refills | Status: DC

## 2016-06-01 NOTE — Assessment & Plan Note (Signed)
Patient was recently admitted on May 11, 2016, and found to have acute segmental PE in left upper lobe with evidence of right heart strain by RV/LV ratio. He then followed up on 10/26. He was prescribed edoxaban on discharge with 5 days of lovenox bridge.  Patient reports that his pleuritic chest pain has resolved, and he denies dyspnea. He denies any problems with tolerating edoxaban and currently able to obtain edoxaban as previously there was a problem with prior authorization.  On exam today, his blood pressure was 132/65. T 98.2, and was satting 100% on room air. Lungs only exhibited mild end exp wheezing bilaterally.   Plan -continue edoxaban

## 2016-06-01 NOTE — Assessment & Plan Note (Signed)
Patient has decreased his smoking from 0.5 pack a day to 3 cigs per day. Encouraged continuing decrease in smoking.

## 2016-06-01 NOTE — Assessment & Plan Note (Signed)
BP Readings from Last 3 Encounters:  06/01/16 132/65  05/14/16 (!) 153/87  05/12/16 138/75    Assessment: Essential hypertension, at goal on lisinopril. Amlodipine and metoprolol were stopped at prior visits  Plan -continue lisinopril

## 2016-06-01 NOTE — Assessment & Plan Note (Signed)
Pt continues to use cocaine.

## 2016-06-01 NOTE — Patient Instructions (Signed)
Thank you for your visit today  Please continue taking the edoxaban [facility]) for your blood clot. You will need to take this for the rest of the life  Please follow up in 3 months

## 2016-06-01 NOTE — Progress Notes (Signed)
   CC: HTN, DM, smoking, substance abuse, PE HPI: [name] is a 60 y.o. man with PMH noted below here for HTN, DM, smoking, substance abuse, PE  Please see Problem List/A&P for the status of the patient's chronic medical problems   Past Medical History:  Diagnosis Date  . Calcium oxalate crystals in urine 12/23/2011   Asymptomatic, no hematuria. Advised to take plenty of water.  . Depression   . Diabetes mellitus without complication ([place])    pre diabetic  . Hepatitis C   . HIV (human immunodeficiency virus infection) ([place])   . Hypertension   . Peripheral arterial disease ([place])    ooccluded left SFA by duplex ultrasound, tibial disease left greater than right  . Stroke ([place]) 11/2011   Carotids Doppler negative. Right sided weakness resolved, initially on Plavix for 3 months and then continued with ASA.    . TB lung, latent 1988   Treated    Review of Systems:  Constitutional: Negative for fever, chills, weight loss and malaise/fatigue.  HEENT: No headaches, vision problems, cough, hearing problems  Respiratory: Negative for cough, shortness of breath and wheezing.Denies pleuritic chest pain or dyspnea with exertion  Cardiovascular: Negative for chest pain,   Gastrointestinal: Negative for nausea, vomiting, abdominal pain, diarrhea and constipation.  Heme: denies bleeding problems after starting edoxaban  Physical Exam: Vitals:   06/01/16 1417  BP: 132/65  Pulse: 100  Temp: 98.2 F (36.8 C)  TempSrc: Oral  SpO2: 100%  Weight: 232 lb 3.2 oz (105.3 kg)  Height: 6' (1.829 m)    General: A&O, in NAD  CV: RRR, normal s1, s2, no m/r/g, Resp: equal and symmetric breath sounds, mild end exp wheezing b/l  Abdomen: soft, nontender, nondistended, +BS Skin: warm, dry, intact,  Neurologic: no focal neurological deficits   Assessment & Plan:   See encounters tab for problem based medical decision making. Patient discussed with Jacob Joseph

## 2016-06-01 NOTE — Assessment & Plan Note (Signed)
Lab Results  Component Value Date   HGBA1C 6.4 03/16/2016   HGBA1C 5.7 12/02/2015   HGBA1C 7.0 01/28/2015    Assessment : Diabetes with at goal A1c, controlled on metformin only  Plan -continue metformin 500 mg daily -aspirin and crestor daily -last eye exam in August, and foot exam upcoming in Jan -follow up in 3 months

## 2016-06-03 ENCOUNTER — Telehealth: Payer: Self-pay | Admitting: *Deleted

## 2016-06-03 NOTE — Telephone Encounter (Signed)
[facility] calls and states pt will need a PA on q-var if you want him to use it, the insurance states spiriva may replace it, I see you have also ordered spiriva for pt so do you want him to have both or is the spiriva good. If you want him to have the QVAR a PA will need to be done

## 2016-06-05 DIAGNOSIS — F329 Major depressive disorder, single episode, unspecified: Secondary | ICD-10-CM | POA: Diagnosis not present

## 2016-06-05 NOTE — Progress Notes (Signed)
Internal Medicine Clinic Attending  Case discussed with Dr. Saraiya soon after the resident saw the patient.  We reviewed the resident's history and exam and pertinent patient test results.  I agree with the assessment, diagnosis, and plan of care documented in the resident's note.  

## 2016-06-08 ENCOUNTER — Other Ambulatory Visit: Payer: Self-pay | Admitting: Internal Medicine

## 2016-06-08 DIAGNOSIS — G894 Chronic pain syndrome: Secondary | ICD-10-CM | POA: Diagnosis not present

## 2016-06-08 DIAGNOSIS — M545 Low back pain: Secondary | ICD-10-CM | POA: Diagnosis not present

## 2016-06-17 ENCOUNTER — Telehealth: Payer: Self-pay | Admitting: Internal Medicine

## 2016-06-17 NOTE — Telephone Encounter (Signed)
appt made for 12/1 at [place]

## 2016-06-17 NOTE — Telephone Encounter (Signed)
Patient would like a call back in regards to his metformin medication.  Patient states he is hving side effects like wetting the bed.

## 2016-06-19 ENCOUNTER — Encounter: Payer: Self-pay | Admitting: Dietician

## 2016-06-19 ENCOUNTER — Ambulatory Visit (INDEPENDENT_AMBULATORY_CARE_PROVIDER_SITE_OTHER): Payer: Medicare Other | Admitting: Internal Medicine

## 2016-06-19 VITALS — BP 141/86 | HR 102 | Temp 98.7°F | Ht 72.0 in | Wt 230.5 lb

## 2016-06-19 DIAGNOSIS — E119 Type 2 diabetes mellitus without complications: Secondary | ICD-10-CM

## 2016-06-19 DIAGNOSIS — N401 Enlarged prostate with lower urinary tract symptoms: Secondary | ICD-10-CM | POA: Diagnosis not present

## 2016-06-19 DIAGNOSIS — Z7984 Long term (current) use of oral hypoglycemic drugs: Secondary | ICD-10-CM | POA: Diagnosis not present

## 2016-06-19 DIAGNOSIS — Z7982 Long term (current) use of aspirin: Secondary | ICD-10-CM | POA: Diagnosis not present

## 2016-06-19 DIAGNOSIS — R351 Nocturia: Secondary | ICD-10-CM | POA: Diagnosis not present

## 2016-06-19 DIAGNOSIS — F17210 Nicotine dependence, cigarettes, uncomplicated: Secondary | ICD-10-CM

## 2016-06-19 HISTORY — DX: Benign prostatic hyperplasia with lower urinary tract symptoms: N40.1

## 2016-06-19 LAB — POCT URINALYSIS DIPSTICK
Bilirubin, UA: NEGATIVE
GLUCOSE UA: NEGATIVE
Ketones, UA: NEGATIVE
Leukocytes, UA: NEGATIVE
NITRITE UA: NEGATIVE
PH UA: 5.5
SPEC GRAV UA: 1.025
UROBILINOGEN UA: 0.2

## 2016-06-19 LAB — POCT GLYCOSYLATED HEMOGLOBIN (HGB A1C): HEMOGLOBIN A1C: 6.3

## 2016-06-19 LAB — GLUCOSE, CAPILLARY: GLUCOSE-CAPILLARY: 184 mg/dL — AB (ref 65–99)

## 2016-06-19 LAB — HM DIABETES EYE EXAM

## 2016-06-19 MED ORDER — TAMSULOSIN HCL 0.4 MG PO CAPS: 0.4000 mg | ORAL_CAPSULE | Freq: Every day | ORAL | 2 refills | Status: DC

## 2016-06-19 NOTE — Assessment & Plan Note (Signed)
Checked retinal exam

## 2016-06-19 NOTE — Patient Instructions (Signed)
Thank you for your visit today  Your symptoms are likely caused by enlarged prostate which we checked. You do not have any infection in the urine.  Please do not drink caffeine prior to bedtime, and try to avoid that during the day. Please also void frequently.  Please take the tamsulosin daily- this should help with your symptoms.  We have also checked your PSA level- we will get back to you with the results and what to do afterwards.  Please follow up in 1-2 months

## 2016-06-19 NOTE — Assessment & Plan Note (Addendum)
Pt presents with 1 month history of 'trouble with his kidneys', and nocturia/ bedwetting.  He attributes this to metformin. He says he goes to bed at 10-11 PM, and wakes up at 5 AM, and he would be wet. He makes sure to void before bedtime. He does not drink caffeine or excessive fluids before bedtime including alcohol. During the day, he voids about 15 times in a day. He experiences some hesitancy in voiding, and increased frequency and urgency. He is not on any medications that can cause polyuria including diuretics.  He denies dysuria or hematuria.   The AUA score for urinary symptoms score was about a '24' indicating severe symptoms.  A dipstick UA was negative for nitrites or leukocytes. Prostate exam was mildly enlarged but not firm or tender.   Assessment: BPH   Plan -started tamsulosin daily -He denies any family history of prostate cancer, but his race and age does put him at some increased risk for it, so I offered to check PSA and pt wanted to have it checked. He understands that elevated PSA may lead to referral to urology and further testing that may include a biopsy and increased surveillance. -RTC in 6 weeks to assess his symptoms -explained timed voiding, avoidance of caffeine, alcohol or excessive fluids before bedtime.   Addendum: His PSA is elevated at 23. Per the guidelines, testing does not need to be repeated- and so we will refer him to Urology for further management.  Placed a voicemail explaining the same

## 2016-06-19 NOTE — Progress Notes (Signed)
    CC: nocturia HPI: Jacob Joseph is a 60 y.o. man with PMH noted below in [facility] today for nocturia  Please see Problem List/A&P for the status of the patient's chronic medical problems   Past Medical History:  Diagnosis Date  . Calcium oxalate crystals in urine 12/23/2011   Asymptomatic, no hematuria. Advised to take plenty of water.  . Depression   . Diabetes mellitus without complication ([place]) 123456   pre diabetic  . Hepatitis C   . HIV (human immunodeficiency virus infection) ([place])   . Hypertension   . Peripheral arterial disease ([place])    ooccluded left SFA by duplex ultrasound, tibial disease left greater than right  . Stroke ([place]) 11/2011   Carotids Doppler negative. Right sided weakness resolved, initially on Plavix for 3 months and then continued with ASA.    . TB lung, latent 1988   Treated    Review of Systems: Denies fevers, chills, wt loss  Denies cough, SOB Denies n/v/d/c. Denies dysuria, hematuria. Has urinary frequency, nocturia, hesitancy, and urgency.  Denies joint pains. Denies tingling  Physical Exam: Vitals:   06/19/16 0941  BP: (!) 141/86  Pulse: (!) 102  Temp: 98.7 F (37.1 C)  TempSrc: Oral  SpO2: 100%  Weight: 230 lb 8 oz (104.6 kg)  Height: 6' (1.829 m)    General: A&O, in NAD CV: RRR, normal s1, s2, no m/r/g,  Resp: equal and symmetric breath sounds, no wheezing or crackles  Abdomen: soft, nontender, nondistended, +BS GU: prostate mildly enlarged. Non-firm and nontender to palpation.  Neurologic: no focal neurological deficits   Assessment & Plan:   See encounters tab for problem based medical decision making. Patient discussed with Dr. Evette Doffing

## 2016-06-20 LAB — PSA: PROSTATE SPECIFIC AG, SERUM: 23.1 ng/mL — AB (ref 0.0–4.0)

## 2016-06-22 DIAGNOSIS — F329 Major depressive disorder, single episode, unspecified: Secondary | ICD-10-CM | POA: Diagnosis not present

## 2016-06-22 NOTE — Addendum Note (Signed)
Addended by: Burgess Estelle A on: 06/22/2016 01:24 PM   Modules accepted: Orders

## 2016-06-22 NOTE — Telephone Encounter (Signed)
I have left a message at the number given informing him of the elevated PSA level. I have asked him to call the clinic back if he does not understand any part of the message or if he has further questions.  We will place the referral for urology.

## 2016-06-22 NOTE — Progress Notes (Signed)
Internal Medicine Clinic Attending  Case discussed with Dr. Saraiya at the time of the visit.  We reviewed the resident's history and exam and pertinent patient test results.  I agree with the assessment, diagnosis, and plan of care documented in the resident's note.  

## 2016-06-30 ENCOUNTER — Encounter: Payer: Self-pay | Admitting: Dietician

## 2016-07-23 ENCOUNTER — Other Ambulatory Visit: Payer: Self-pay | Admitting: Internal Medicine

## 2016-07-23 DIAGNOSIS — B20 Human immunodeficiency virus [HIV] disease: Secondary | ICD-10-CM

## 2016-07-23 DIAGNOSIS — E118 Type 2 diabetes mellitus with unspecified complications: Secondary | ICD-10-CM

## 2016-07-24 MED ORDER — GABAPENTIN 300 MG PO CAPS: 600.0000 mg | ORAL_CAPSULE | Freq: Every day | ORAL | 2 refills | Status: DC

## 2016-08-05 ENCOUNTER — Ambulatory Visit: Payer: Medicare Other | Admitting: Podiatry

## 2016-08-13 DIAGNOSIS — R972 Elevated prostate specific antigen [PSA]: Secondary | ICD-10-CM | POA: Diagnosis not present

## 2016-08-13 DIAGNOSIS — N401 Enlarged prostate with lower urinary tract symptoms: Secondary | ICD-10-CM | POA: Diagnosis not present

## 2016-08-13 DIAGNOSIS — R3915 Urgency of urination: Secondary | ICD-10-CM | POA: Diagnosis not present

## 2016-08-17 ENCOUNTER — Telehealth: Payer: Self-pay | Admitting: *Deleted

## 2016-08-17 NOTE — Telephone Encounter (Signed)
Dr ottelin's office calls and states they will be doing a prostrate biopsy and they want pt to come off of savaysa for 3 days prior, does he need a bridge? Please advise with plan, I will fax or call dr ottelin's nurse sonia with plan [phone] ext [phone] Fax: [phone] Sending to dr's saraiya, Software engineer, k[place] [facility] [place]

## 2016-08-19 ENCOUNTER — Encounter: Payer: Self-pay | Admitting: Podiatry

## 2016-08-19 ENCOUNTER — Telehealth: Payer: Self-pay | Admitting: *Deleted

## 2016-08-19 ENCOUNTER — Ambulatory Visit (INDEPENDENT_AMBULATORY_CARE_PROVIDER_SITE_OTHER): Payer: Medicare Other | Admitting: Podiatry

## 2016-08-19 VITALS — BP 92/63 | HR 104 | Resp 18

## 2016-08-19 DIAGNOSIS — B351 Tinea unguium: Secondary | ICD-10-CM | POA: Diagnosis not present

## 2016-08-19 DIAGNOSIS — M79676 Pain in unspecified toe(s): Secondary | ICD-10-CM

## 2016-08-19 NOTE — Telephone Encounter (Signed)
Request from [employer] for PA for Qvar.  Patient will need to try and fail the preferred meds  Pulmicort Flexhaler, Serevent Diskus, Sprivia Handihaler or Sprivia Respimat first. List given to Dr. Tiburcio Pea.  Sander Nephew, RN 08/19/2016 12:01 AM

## 2016-08-19 NOTE — Progress Notes (Signed)
Patient ID: Jacob Joseph, male   DOB: 04/28/1956, 61 y.o.   MRN: [id number]    Subjective: This patient presents today complaining of thickened and elongated toenails which are uncomfortable when walking wearing shoes and requests toenail debridement. He was last seen for this visit on 11/26/2014 and at that time advised to return at three-month intervals for debridement of the mycotic toenails. He does not present until today for follow-up care. He denies any open lesions, claudication since the previous visit Patient has history of previous use of terbinafine for toenail fungus Patient is HIV-positive and a diabetic      Patient appears orientated 3  Vascular: DP 0/4 bilaterally PT right 2/4 PT left 0/4 Capillary reflex immediate bilaterally  Neurological: Sensation to 10 g monofilament wire intact 3/5 bilaterally Vibratory sensation reactive bilaterally Ankle reflex reactive bilaterally  Dermatological: No open skin lesions bilaterally Dry moccasin scaling skin medially plantarly and laterally bilaterally The toenails are extremely elongated, hypertrophic, brittle, discolored and tender to back palpation 6-10  Musculoskeletal: HAV deformities bilaterally  Assessment & Plan:   Assessment: Decrease pedal pulses suggestive peripheral arterial disease Mild peripheral neuropathy symptomatic onychomycoses 6-10 Tinea pedis bilaterally HIV-positive  Plan:  Nails 10 are debrided mechanically and electrically without any bleeding  Reappoint 3 months

## 2016-08-19 NOTE — Telephone Encounter (Signed)
I spoke to urology and advised patient would not need to bridge, can hold the Savaysa x 1 day prior to prostate biopsy (CrCl ~74, HAS-BLED 1). Nurse stated she would call us back, so awaiting their response. Will inform team once I hear back. Thank you.

## 2016-08-20 NOTE — Telephone Encounter (Addendum)
[name] prefers to bridge patient. Thank you

## 2016-08-20 NOTE — Telephone Encounter (Signed)
Thanks

## 2016-08-21 MED ORDER — TIOTROPIUM BROMIDE MONOHYDRATE 18 MCG IN CAPS: ORAL_CAPSULE | RESPIRATORY_TRACT | 12 refills | Status: DC

## 2016-08-21 MED ORDER — BUDESONIDE 90 MCG/ACT IN AEPB: 1.0000 | INHALATION_SPRAY | Freq: Two times a day (BID) | RESPIRATORY_TRACT | 3 refills | Status: DC

## 2016-08-21 NOTE — Telephone Encounter (Signed)
Was able to talk with the patient just now and his wife- explained to keep taking the edoxaban unti l the day prior to surgery- and hold it 24 hours prior to the procedure. Resume it after the procedure. Explained that if he has any question, then to call clinic Monday morning for further explanation.

## 2016-08-21 NOTE — Telephone Encounter (Signed)
I agree with Dr Lynnae January and Dr. Maudie Mercury. According to this paper: [employer]  " In high bleeding risk procedures, edoxaban should be discontinued 72 hours prior. Edoxaban is recommended to be discontinued at least 24 hours prior to low bleeding risk procedures, irrespective of renal functional status" "In the perioperative setting, the driving force that dictates whether to bridge anticoagulation is determined by patients' risk for thromboembolism and stroke. Atrial fibrillation risk of stroke is determined by the CHADS2VASc score, and risk of VTE is determined by timing of most recent VTE and inherited or acquired thrombophilias. In high-risk thromboembolic patients, bridging of edoxaban is recommended prior to procedure; however, due to the rapid onset of edoxaban, bridging postoperatively is usually not required.".  I think for this reason, bridging is not rEquired due to the rapid onset action of edoxaban Also, prostate biopsy is not considered a high bleeding risk procedure. So Savaysa should only be held 24 hours prior.  THanks.[name] Kitchen

## 2016-08-21 NOTE — Telephone Encounter (Signed)
I researched perioperative savaysa and spoke to Dr Darnell Level and agree with Dr Julianne Rice assessment that the med only needs to be held one day and that bridging is not required. We should not hold for three days and bridge as this will expose the pt to unnecessary risks. I am CC'ing back to the PCP to address ASAP.

## 2016-08-21 NOTE — Telephone Encounter (Signed)
I called [facility] and discussed with Alleen Borne, the nurse who had initially called as the Dr was not there. I advised the nurse that we are recommending holding the edoxaban only the day prior to the procedure (and not 3 days) and resume it after the procedure. The biopsy is scheduled for February 6th. The nurse indicated understanding of that and repeated back to me and she will inform the surgeon

## 2016-08-21 NOTE — Telephone Encounter (Signed)
I first called pt at 1534 and left a detailed message for him, it is now 1654 and I am not getting an answer at all. Will try again mon am as soon as I come in

## 2016-08-21 NOTE — Telephone Encounter (Signed)
Dr Tiburcio Pea, The surgeon has already been told that [facility] would manage the bridging. You have now changed recs and are advising only holding one day rather than 3 days and not providing bridging. Since Jacob Joseph is not my pt I do not know the date of the surgery but I believe Dr Maudie Mercury mentioned it was next week. Pls reach out to the surgeon personally and follow this through.

## 2016-08-21 NOTE — Addendum Note (Signed)
Addended by: Burgess Estelle A on: 08/21/2016 09:14 PM   Modules accepted: Orders

## 2016-08-23 ENCOUNTER — Other Ambulatory Visit: Payer: Self-pay | Admitting: Internal Medicine

## 2016-08-23 DIAGNOSIS — B20 Human immunodeficiency virus [HIV] disease: Secondary | ICD-10-CM

## 2016-08-24 ENCOUNTER — Telehealth: Payer: Self-pay | Admitting: *Deleted

## 2016-08-24 ENCOUNTER — Other Ambulatory Visit: Payer: Medicare Other

## 2016-08-24 DIAGNOSIS — B20 Human immunodeficiency virus [HIV] disease: Secondary | ICD-10-CM

## 2016-08-24 DIAGNOSIS — Z79899 Other long term (current) drug therapy: Secondary | ICD-10-CM

## 2016-08-24 LAB — CBC
HCT: 37.4 % — ABNORMAL LOW (ref 38.5–50.0)
HEMOGLOBIN: 12.2 g/dL — AB (ref 13.2–17.1)
MCH: 28 pg (ref 27.0–33.0)
MCHC: 32.6 g/dL (ref 32.0–36.0)
MCV: 86 fL (ref 80.0–100.0)
MPV: 11.2 fL (ref 7.5–12.5)
Platelets: 199 10*3/uL (ref 140–400)
RBC: 4.35 MIL/uL (ref 4.20–5.80)
RDW: 14 % (ref 11.0–15.0)
WBC: 10.7 10*3/uL (ref 3.8–10.8)

## 2016-08-24 NOTE — Telephone Encounter (Signed)
Dr ottelin's office called and states that he disagrees w/ the decision not to bridge pt, he is worried about bleeding, have given him dr butcher's office # and pager also faxed his office the notes

## 2016-08-24 NOTE — Telephone Encounter (Signed)
3 days ago dr Software engineer ask me to call pt and give instructions, called pt and lm for rtc, called again late that pm and lm with detailed instructions.

## 2016-08-25 ENCOUNTER — Other Ambulatory Visit: Payer: Medicare Other

## 2016-08-25 DIAGNOSIS — D075 Carcinoma in situ of prostate: Secondary | ICD-10-CM | POA: Diagnosis not present

## 2016-08-25 DIAGNOSIS — N401 Enlarged prostate with lower urinary tract symptoms: Secondary | ICD-10-CM | POA: Diagnosis not present

## 2016-08-25 DIAGNOSIS — R972 Elevated prostate specific antigen [PSA]: Secondary | ICD-10-CM | POA: Diagnosis not present

## 2016-08-25 DIAGNOSIS — C61 Malignant neoplasm of prostate: Secondary | ICD-10-CM | POA: Diagnosis not present

## 2016-08-25 LAB — COMPREHENSIVE METABOLIC PANEL
ALT: 16 U/L (ref 9–46)
AST: 18 U/L (ref 10–35)
Albumin: 4.2 g/dL (ref 3.6–5.1)
Alkaline Phosphatase: 59 U/L (ref 40–115)
BUN: 16 mg/dL (ref 7–25)
CALCIUM: 9.8 mg/dL (ref 8.6–10.3)
CHLORIDE: 105 mmol/L (ref 98–110)
CO2: 21 mmol/L (ref 20–31)
Creat: 1.48 mg/dL — ABNORMAL HIGH (ref 0.70–1.33)
Glucose, Bld: 157 mg/dL — ABNORMAL HIGH (ref 65–99)
Potassium: 3.9 mmol/L (ref 3.5–5.3)
Sodium: 138 mmol/L (ref 135–146)
TOTAL PROTEIN: 7.6 g/dL (ref 6.1–8.1)
Total Bilirubin: 0.3 mg/dL (ref 0.2–1.2)

## 2016-08-25 LAB — LIPID PANEL
CHOL/HDL RATIO: 3.1 ratio (ref <5.0)
CHOLESTEROL: 119 mg/dL (ref <200)
HDL: 39 mg/dL — AB (ref >40)
LDL Cholesterol: 52 mg/dL (ref <100)
TRIGLYCERIDES: 142 mg/dL (ref <150)
VLDL: 28 mg/dL (ref <30)

## 2016-08-25 LAB — RPR

## 2016-08-25 NOTE — Telephone Encounter (Signed)
This is Dr Sherlynn Carbon pt. Sending to him as PCP

## 2016-08-25 NOTE — Telephone Encounter (Signed)
Discussed with Dr Karsten Ro He said that the patient misunderstood their instruction and stopped taking his anticoagulation 1 week ago (however when I called on Friday, he specifically tole me he was still taking the edoxaban) . He was then told by the office not to come in today until the confusion was cleared- he came in anyway. So they went ahead and did his biopsy.  He is now back on the edoxaban. They will forward the results to our office.

## 2016-08-26 ENCOUNTER — Other Ambulatory Visit: Payer: Self-pay

## 2016-08-26 LAB — T-HELPER CELL (CD4) - (RCID CLINIC ONLY)
CD4 T CELL ABS: 680 /uL (ref 400–2700)
CD4 T CELL HELPER: 20 % — AB (ref 33–55)

## 2016-08-26 LAB — HIV-1 RNA QUANT-NO REFLEX-BLD
HIV 1 RNA Quant: <20 copies/mL — AB
HIV-1 RNA Quant, Log: <1.3 Log copies/mL — AB

## 2016-08-27 ENCOUNTER — Other Ambulatory Visit: Payer: Self-pay | Admitting: Internal Medicine

## 2016-08-28 MED ORDER — ALBUTEROL SULFATE HFA 108 (90 BASE) MCG/ACT IN AERS: INHALATION_SPRAY | RESPIRATORY_TRACT | 5 refills | Status: DC

## 2016-09-04 ENCOUNTER — Other Ambulatory Visit: Payer: Self-pay | Admitting: Internal Medicine

## 2016-09-04 DIAGNOSIS — E119 Type 2 diabetes mellitus without complications: Secondary | ICD-10-CM

## 2016-09-07 ENCOUNTER — Other Ambulatory Visit (HOSPITAL_COMMUNITY): Payer: Self-pay | Admitting: Urology

## 2016-09-07 DIAGNOSIS — C61 Malignant neoplasm of prostate: Secondary | ICD-10-CM | POA: Diagnosis not present

## 2016-09-08 ENCOUNTER — Encounter: Payer: Self-pay | Admitting: Internal Medicine

## 2016-09-08 ENCOUNTER — Ambulatory Visit (INDEPENDENT_AMBULATORY_CARE_PROVIDER_SITE_OTHER): Payer: Medicare Other | Admitting: Internal Medicine

## 2016-09-08 DIAGNOSIS — B20 Human immunodeficiency virus [HIV] disease: Secondary | ICD-10-CM

## 2016-09-08 DIAGNOSIS — F1910 Other psychoactive substance abuse, uncomplicated: Secondary | ICD-10-CM | POA: Diagnosis not present

## 2016-09-08 DIAGNOSIS — F17210 Nicotine dependence, cigarettes, uncomplicated: Secondary | ICD-10-CM

## 2016-09-08 DIAGNOSIS — B1710 Acute hepatitis C without hepatic coma: Secondary | ICD-10-CM | POA: Diagnosis not present

## 2016-09-08 DIAGNOSIS — Z23 Encounter for immunization: Secondary | ICD-10-CM | POA: Diagnosis present

## 2016-09-08 NOTE — Assessment & Plan Note (Signed)
He continues to use cocaine on a intermittent but regular basis. He would like to quit.

## 2016-09-08 NOTE — Progress Notes (Signed)
Patient Active Problem List   Diagnosis Date Noted  . Human immunodeficiency virus (HIV) disease ([place]) 04/27/2006    Priority: High  . BPH associated with nocturia 06/19/2016  . PE (pulmonary thromboembolism) ([place]) 05/12/2016  . Acute pulmonary embolism ([place]) 05/11/2016  . Substance abuse 01/27/2016  . Vitamin D deficiency 12/05/2015  . Hyperlipidemia 01/28/2015  . Cataracts, bilateral 08/29/2014  . Diabetes mellitus type 2 with complications ([place]) 123456  . Community acquired pneumonia 09/28/2013  . COPD, moderate ([place]) 05/31/2013  . Bilateral lower extremity pain 05/08/2013  . Complex Lt Renal Cyst 03/22/2013  . Preventive measure 05/30/2012  . Normocytic anemia 03/08/2012  . CKD Stage 2 03/08/2012  . ERECTILE DYSFUNCTION 04/25/2008  . Smoking greater than 30 pack years 04/27/2006  . Major depressive disorder, recurrent episode ([place]) 04/27/2006  . Essential hypertension 04/27/2006  . DEGENERATIVE JOINT DISEASE 04/27/2006  . Chronic Low Back Pain 04/27/2006    Patient's Medications  New Prescriptions   No medications on file  Previous Medications   ALBUTEROL (PROAIR HFA) 108 (90 BASE) MCG/ACT INHALER    INHALE 2 PUFFS INTO THE LUNGS EVERY 6 HOURS AS NEEDED FOR WHEEZING OR SHORTNESS OF BREATH, TO NOT USE EVERY DAY, USE ONLY IF NEEDED   ASPIRIN 81 MG EC TABLET    Take 1 tablet (81 mg total) by mouth daily.   BUDESONIDE 90 MCG/ACT INHALER    Inhale 1-2 puffs into the lungs 2 (two) times daily.   CHOLECALCIFEROL (VITAMIN D) 1000 UNITS TABLET    Take 1 tablet (1,000 Units total) by mouth daily.   CITALOPRAM (CELEXA) 20 MG TABLET    Take 20 mg by mouth daily.   DICLOFENAC SODIUM (VOLTAREN) 1 % GEL    Apply 4 g topically 4 (four) times daily.   EDOXABAN (SAVAYSA) 60 MG TABS TABLET    Take 60 mg by mouth daily.   GABAPENTIN (NEURONTIN) 300 MG CAPSULE    Take 2 capsules (600 mg total) by mouth at bedtime.   GENVOYA 150-150-200-10 MG TABS TABLET    TAKE 1 TABLET BY  MOUTH DAILY WITH BREAKFAST(TAKE WITH PREZISTA) CALL [phone] FOR MISSED APPOINTMENT   HYDROXYPROPYL METHYLCELLULOSE / HYPROMELLOSE (ISOPTO TEARS / GONIOVISC) 2.5 % OPHTHALMIC SOLUTION    Place 1 drop into both eyes as needed for dry eyes.   LISINOPRIL (PRINIVIL,ZESTRIL) 5 MG TABLET    Take 1 tablet (5 mg total) by mouth daily.   LISINOPRIL (PRINIVIL,ZESTRIL) 5 MG TABLET    TAKE 1 TABLET BY MOUTH DAILY   METFORMIN (GLUCOPHAGE) 500 MG TABLET    Take 1 tablet (500 mg total) by mouth daily with breakfast.   PREZISTA 800 MG TABLET    TAKE 1 TABLET BY MOUTH EVERY DAY WITH BREAKFAST   QUETIAPINE (SEROQUEL) 300 MG TABLET    Take 300 mg by mouth 2 (two) times daily.    ROSUVASTATIN (CRESTOR) 10 MG TABLET    Take 1 tablet (10 mg total) by mouth at bedtime.   SILDENAFIL (VIAGRA) 50 MG TABLET    Take 1 tablet (50 mg total) by mouth daily as needed for erectile dysfunction.   TAMSULOSIN (FLOMAX) 0.4 MG CAPS CAPSULE    Take 1 capsule (0.4 mg total) by mouth daily after breakfast.   TIOTROPIUM (SPIRIVA HANDIHALER) 18 MCG INHALATION CAPSULE    INHALE THE CONTENTS OF 1 CASPSULE USING HANDIHALER EVERY DAY   VALACYCLOVIR (VALTREX) 500 MG TABLET    TAKE 1 TABLET  BY MOUTH DAILY  Modified Medications   No medications on file  Discontinued Medications   No medications on file    Subjective: Jacob Joseph is in for his routine HIV follow-up visit. He has had no problems obtaining his HIV medications. He takes [name] and Prezista every day around noon with a meal. He does not recall missing any doses. He continues to smoke about a half pack of cigarettes daily and has no current plan to quit. He last used cocaine several weeks ago. He tells me that he was just recently diagnosed with prostate cancer. His PSA had jumped up to 23.1. He saw [name] and underwent a prostate biopsy. He is scheduled for some follow-up scans before discussing treatment options.  Review of Systems: Review of Systems  Constitutional: Negative  for chills, diaphoresis, fever, malaise/fatigue and weight loss.  HENT: Negative for sore throat.   Respiratory: Positive for cough and shortness of breath. Negative for sputum production.   Cardiovascular: Negative for chest pain.  Gastrointestinal: Negative for abdominal pain, diarrhea, heartburn, nausea and vomiting.  Genitourinary: Negative for dysuria and frequency.  Musculoskeletal: Negative for joint pain and myalgias.  Skin: Negative for rash.  Neurological: Negative for dizziness and headaches.  Psychiatric/Behavioral: Positive for substance abuse. Negative for depression. The patient is not nervous/anxious.     Past Medical History:  Diagnosis Date  . Calcium oxalate crystals in urine 12/23/2011   Asymptomatic, no hematuria. Advised to take plenty of water.  . Depression   . Diabetes mellitus without complication ([place]) 123456   pre diabetic  . Hepatitis C   . HIV (human immunodeficiency virus infection) ([name])   . Hypertension   . Peripheral arterial disease ([place])    ooccluded left SFA by duplex ultrasound, tibial disease left greater than right  . Stroke ([place]) 11/2011   Carotids Doppler negative. Right sided weakness resolved, initially on Plavix for 3 months and then continued with ASA.    . TB lung, latent 1988   Treated    Social History  Substance Use Topics  . Smoking status: Current Every Day Smoker    Packs/day: 0.50    Years: 41.00    Types: Cigarettes    Last attempt to quit: 11/18/2014  . Smokeless tobacco: Never Used  . Alcohol use No    Family History  Problem Relation Age of Onset  . Hypertension Mother   . Heart attack Mother   . Stroke Mother   . Hypertension Father   . Cancer Father     No Known Allergies  Objective:  Vitals:   09/08/16 1001  BP: 128/84  Pulse: 96  Temp: 98.4 F (36.9 C)  TempSrc: Oral  Weight: 223 lb (101.2 kg)  Height: 6' (1.829 m)   Body mass index is 30.24 kg/m.  Physical Exam  Constitutional: He is oriented  to person, place, and time.  He is in good spirits.  HENT:  Mouth/Throat: No oropharyngeal exudate.  Eyes: Conjunctivae are normal.  Cardiovascular: Normal rate and regular rhythm.   No murmur heard. Pulmonary/Chest: Effort normal and breath sounds normal. He has no wheezes. He has no rales.  Abdominal: Soft. He exhibits no mass. There is no tenderness.  Musculoskeletal: Normal range of motion.  Neurological: He is alert and oriented to person, place, and time.  Skin: No rash noted.  Psychiatric: Mood and affect normal.    Lab Results Lab Results  Component Value Date   WBC 10.7 08/24/2016   HGB 12.2 (L)  08/24/2016   HCT 37.4 (L) 08/24/2016   MCV 86.0 08/24/2016   PLT 199 08/24/2016    Lab Results  Component Value Date   CREATININE 1.48 (H) 08/24/2016   BUN 16 08/24/2016   NA 138 08/24/2016   K 3.9 08/24/2016   CL 105 08/24/2016   CO2 21 08/24/2016    Lab Results  Component Value Date   ALT 16 08/24/2016   AST 18 08/24/2016   ALKPHOS 59 08/24/2016   BILITOT 0.3 08/24/2016    Lab Results  Component Value Date   CHOL 119 08/24/2016   HDL 39 (L) 08/24/2016   LDLCALC 52 08/24/2016   TRIG 142 08/24/2016   CHOLHDL 3.1 08/24/2016   HIV 1 RNA Quant (copies/mL)  Date Value  08/24/2016 <20 DETECTED (A)  01/15/2016 <20  11/12/2014 <20   CD4 T Cell Abs (/uL)  Date Value  08/24/2016 680  01/15/2016 670  11/12/2014 400     Problem List Items Addressed This Visit      High   Human immunodeficiency virus (HIV) disease ([place]) (Chronic)    His infection is under excellent, long-term control. He will continue his current antiretroviral regimen and follow-up after lab work in 6 months.      Relevant Orders   T-helper cell (CD4)- ([facility] clinic only)   HIV 1 RNA quant-no reflex-bld   T-helper cell (CD4)- ([facility] clinic only)   HIV 1 RNA quant-no reflex-bld     Unprioritized   Smoking greater than 30 pack years    I talked to him again about the importance of  cigarette cessation, especially in light of his COPD and previous strokes.      Substance abuse    He continues to use cocaine on a intermittent but regular basis. He would like to quit.           Michel Bickers, MD [facility] [phone] pager   [phone] cell 09/08/2016, 11:47 AM

## 2016-09-08 NOTE — Assessment & Plan Note (Signed)
His infection is under excellent, long-term control. He will continue his current antiretroviral regimen and follow-up after lab work in 6 months. 

## 2016-09-08 NOTE — Assessment & Plan Note (Signed)
I talked to him again about the importance of cigarette cessation, especially in light of his COPD and previous strokes.

## 2016-09-09 ENCOUNTER — Telehealth: Payer: Self-pay | Admitting: *Deleted

## 2016-09-09 ENCOUNTER — Telehealth: Payer: Self-pay

## 2016-09-09 NOTE — Telephone Encounter (Signed)
Thank you- I think it is ok for the patient to concurrently take pulmicort and prezista.   However, I would like to see Dr. Julianne Rice opinion as the level is "C" recommending monitoring of therapy.  There are some case reports of iatrogenic cushings syndrome in a patient taking budesonide and cyp3a4 inhibitors but they seem to be rare.  --- Risk Rating C: Monitor therapy  Summary CYP3A4 Inhibitors (Strong) may increase the serum concentration of Budesonide (Oral Inhalation). Severity Moderate Reliability Rating Fair  Patient Management Monitor patients for signs and symptoms of corticosteroid excees if inhaled budesonide is used in a patient taking a strong CYP3A4 inhibitor.

## 2016-09-09 NOTE — Telephone Encounter (Signed)
Per pharmacy faxed note: patient has rx for pulmicort. Currently taking prezista w/c could possibly cause an interxn. Wanted to make sure it is ok to dispense.  Pls advise.

## 2016-09-09 NOTE — Telephone Encounter (Signed)
Jacob Joseph from [facility] needs to speak with a nurse regarding pulmicort. Please call back.

## 2016-09-09 NOTE — Telephone Encounter (Signed)
Pharmacy wants md to be aware there could be a negative reaction between pulmicort and pt's antviral meds. Just wants you to know of the possibility

## 2016-09-10 NOTE — Telephone Encounter (Signed)
Sarah, please see below

## 2016-09-14 ENCOUNTER — Telehealth: Payer: Self-pay | Admitting: *Deleted

## 2016-09-14 DIAGNOSIS — J449 Chronic obstructive pulmonary disease, unspecified: Secondary | ICD-10-CM

## 2016-09-14 NOTE — Telephone Encounter (Addendum)
PA information was called to [employer] for Advair Diskus.  Information to be sent to Pharmacy division for review.  [id number] reference #  Sander Nephew, RN 09/14/2016 3:40 PM.  Decision from patient's insurance company.  Insurance will cover Celedonio Miyamoto, Serevent Diskus and Stiolto which are on their formulary.  Message to be sent to Dr. Tiburcio Pea patient's PCP.  Sander Nephew, RN 09/15/2016 1:20 PM

## 2016-09-16 ENCOUNTER — Encounter (HOSPITAL_COMMUNITY)
Admission: RE | Admit: 2016-09-16 | Discharge: 2016-09-16 | Disposition: A | Discharge status: Home / Self Care | Payer: Medicare Other | Source: Ambulatory Visit | Attending: Urology | Admitting: Urology

## 2016-09-16 DIAGNOSIS — C61 Malignant neoplasm of prostate: Secondary | ICD-10-CM | POA: Diagnosis not present

## 2016-09-16 DIAGNOSIS — Z8546 Personal history of malignant neoplasm of prostate: Secondary | ICD-10-CM | POA: Diagnosis not present

## 2016-09-16 MED ORDER — TECHNETIUM TC 99M MEDRONATE IV KIT
22.4000 | PACK | Freq: Once | INTRAVENOUS | Status: AC | PRN
  Administered 2016-09-16: 22.4 via INTRAVENOUS

## 2016-09-16 MED ORDER — TIOTROPIUM BROMIDE-OLODATEROL 2.5-2.5 MCG/ACT IN AERS: 2.5000 ug | INHALATION_SPRAY | Freq: Once | RESPIRATORY_TRACT | 3 refills | Status: DC

## 2016-09-16 NOTE — Telephone Encounter (Signed)
I would like to switch to stiolto and willl be sending the prescription in lieu of spiriva thx

## 2016-09-17 MED ORDER — TIOTROPIUM BROMIDE-OLODATEROL 2.5-2.5 MCG/ACT IN AERS: 5.0000 ug | INHALATION_SPRAY | Freq: Once | RESPIRATORY_TRACT | 3 refills | Status: AC

## 2016-09-18 NOTE — Telephone Encounter (Signed)
Hi Dr. Tiburcio Pea, the dose was correct but just needed to change to 2 puffs daily.   Thanks for checking on it---I corrected the Rx so no worries.

## 2016-09-21 ENCOUNTER — Emergency Department (HOSPITAL_COMMUNITY): Payer: Medicare Other

## 2016-09-21 ENCOUNTER — Inpatient Hospital Stay (HOSPITAL_COMMUNITY)
Admission: EM | Admit: 2016-09-21 | Discharge: 2016-09-23 | DRG: 193 | Disposition: A | Discharge status: Home / Self Care | Payer: Medicare Other | Attending: Student in an Organized Health Care Education/Training Program | Admitting: Student in an Organized Health Care Education/Training Program

## 2016-09-21 ENCOUNTER — Other Ambulatory Visit: Payer: Self-pay

## 2016-09-21 DIAGNOSIS — R000 Tachycardia, unspecified: Secondary | ICD-10-CM | POA: Diagnosis not present

## 2016-09-21 DIAGNOSIS — J1000 Influenza due to other identified influenza virus with unspecified type of pneumonia: Principal | ICD-10-CM | POA: Diagnosis present

## 2016-09-21 DIAGNOSIS — E119 Type 2 diabetes mellitus without complications: Secondary | ICD-10-CM | POA: Diagnosis present

## 2016-09-21 DIAGNOSIS — E559 Vitamin D deficiency, unspecified: Secondary | ICD-10-CM | POA: Diagnosis present

## 2016-09-21 DIAGNOSIS — R509 Fever, unspecified: Secondary | ICD-10-CM | POA: Diagnosis not present

## 2016-09-21 DIAGNOSIS — Z86711 Personal history of pulmonary embolism: Secondary | ICD-10-CM

## 2016-09-21 DIAGNOSIS — E785 Hyperlipidemia, unspecified: Secondary | ICD-10-CM | POA: Diagnosis present

## 2016-09-21 DIAGNOSIS — J101 Influenza due to other identified influenza virus with other respiratory manifestations: Secondary | ICD-10-CM | POA: Diagnosis present

## 2016-09-21 DIAGNOSIS — J189 Pneumonia, unspecified organism: Secondary | ICD-10-CM | POA: Diagnosis present

## 2016-09-21 DIAGNOSIS — J441 Chronic obstructive pulmonary disease with (acute) exacerbation: Secondary | ICD-10-CM | POA: Diagnosis present

## 2016-09-21 DIAGNOSIS — Z7984 Long term (current) use of oral hypoglycemic drugs: Secondary | ICD-10-CM

## 2016-09-21 DIAGNOSIS — B1920 Unspecified viral hepatitis C without hepatic coma: Secondary | ICD-10-CM | POA: Diagnosis present

## 2016-09-21 DIAGNOSIS — Z823 Family history of stroke: Secondary | ICD-10-CM

## 2016-09-21 DIAGNOSIS — A419 Sepsis, unspecified organism: Secondary | ICD-10-CM

## 2016-09-21 DIAGNOSIS — Z79899 Other long term (current) drug therapy: Secondary | ICD-10-CM

## 2016-09-21 DIAGNOSIS — R0602 Shortness of breath: Secondary | ICD-10-CM | POA: Diagnosis not present

## 2016-09-21 DIAGNOSIS — E1151 Type 2 diabetes mellitus with diabetic peripheral angiopathy without gangrene: Secondary | ICD-10-CM | POA: Diagnosis present

## 2016-09-21 DIAGNOSIS — F17210 Nicotine dependence, cigarettes, uncomplicated: Secondary | ICD-10-CM | POA: Diagnosis present

## 2016-09-21 DIAGNOSIS — E1122 Type 2 diabetes mellitus with diabetic chronic kidney disease: Secondary | ICD-10-CM | POA: Diagnosis present

## 2016-09-21 DIAGNOSIS — N179 Acute kidney failure, unspecified: Secondary | ICD-10-CM | POA: Diagnosis present

## 2016-09-21 DIAGNOSIS — C61 Malignant neoplasm of prostate: Secondary | ICD-10-CM | POA: Diagnosis present

## 2016-09-21 DIAGNOSIS — Z8249 Family history of ischemic heart disease and other diseases of the circulatory system: Secondary | ICD-10-CM

## 2016-09-21 DIAGNOSIS — Z809 Family history of malignant neoplasm, unspecified: Secondary | ICD-10-CM

## 2016-09-21 DIAGNOSIS — F339 Major depressive disorder, recurrent, unspecified: Secondary | ICD-10-CM | POA: Diagnosis present

## 2016-09-21 DIAGNOSIS — F1410 Cocaine abuse, uncomplicated: Secondary | ICD-10-CM | POA: Diagnosis present

## 2016-09-21 DIAGNOSIS — J440 Chronic obstructive pulmonary disease with acute lower respiratory infection: Secondary | ICD-10-CM | POA: Diagnosis present

## 2016-09-21 DIAGNOSIS — B20 Human immunodeficiency virus [HIV] disease: Secondary | ICD-10-CM | POA: Diagnosis present

## 2016-09-21 DIAGNOSIS — Z7982 Long term (current) use of aspirin: Secondary | ICD-10-CM

## 2016-09-21 DIAGNOSIS — N183 Chronic kidney disease, stage 3 (moderate): Secondary | ICD-10-CM | POA: Diagnosis present

## 2016-09-21 DIAGNOSIS — I129 Hypertensive chronic kidney disease with stage 1 through stage 4 chronic kidney disease, or unspecified chronic kidney disease: Secondary | ICD-10-CM | POA: Diagnosis present

## 2016-09-21 DIAGNOSIS — E860 Dehydration: Secondary | ICD-10-CM | POA: Diagnosis present

## 2016-09-21 DIAGNOSIS — Z7951 Long term (current) use of inhaled steroids: Secondary | ICD-10-CM

## 2016-09-21 HISTORY — DX: Chronic obstructive pulmonary disease, unspecified: J44.9

## 2016-09-21 LAB — CBC WITH DIFFERENTIAL/PLATELET
BASOS ABS: 0 10*3/uL (ref 0.0–0.1)
Basophils Relative: 0 %
EOS ABS: 0.1 10*3/uL (ref 0.0–0.7)
EOS PCT: 1 %
HCT: 36.5 % — ABNORMAL LOW (ref 39.0–52.0)
HEMOGLOBIN: 11.9 g/dL — AB (ref 13.0–17.0)
LYMPHS ABS: 1.1 10*3/uL (ref 0.7–4.0)
LYMPHS PCT: 12 %
MCH: 28.1 pg (ref 26.0–34.0)
MCHC: 32.6 g/dL (ref 30.0–36.0)
MCV: 86.3 fL (ref 78.0–100.0)
Monocytes Absolute: 1.1 10*3/uL — ABNORMAL HIGH (ref 0.1–1.0)
Monocytes Relative: 12 %
Neutro Abs: 6.6 10*3/uL (ref 1.7–7.7)
Neutrophils Relative %: 75 %
PLATELETS: 161 10*3/uL (ref 150–400)
RBC: 4.23 MIL/uL (ref 4.22–5.81)
RDW: 14.9 % (ref 11.5–15.5)
WBC: 8.9 10*3/uL (ref 4.0–10.5)

## 2016-09-21 LAB — D-DIMER, QUANTITATIVE (NOT AT ARMC): D DIMER QUANT: 0.51 ug{FEU}/mL — AB (ref 0.00–0.50)

## 2016-09-21 LAB — I-STAT CG4 LACTIC ACID, ED: Lactic Acid, Venous: 2.8 mmol/L (ref 0.5–1.9)

## 2016-09-21 MED ORDER — SODIUM CHLORIDE 0.9 % IV BOLUS (SEPSIS)
1000.0000 mL | Freq: Once | INTRAVENOUS | Status: AC
  Administered 2016-09-21: 1000 mL via INTRAVENOUS

## 2016-09-21 MED ORDER — ALBUTEROL (5 MG/ML) CONTINUOUS INHALATION SOLN
10.0000 mg/h | INHALATION_SOLUTION | Freq: Once | RESPIRATORY_TRACT | Status: AC
  Administered 2016-09-21: 10 mg/h via RESPIRATORY_TRACT
  Filled 2016-09-21: qty 20

## 2016-09-21 MED ORDER — ACETAMINOPHEN 500 MG PO TABS
1000.0000 mg | ORAL_TABLET | Freq: Once | ORAL | Status: AC
  Administered 2016-09-22: 1000 mg via ORAL
  Filled 2016-09-21: qty 2

## 2016-09-21 MED ORDER — SODIUM CHLORIDE 0.9 % IV BOLUS (SEPSIS)
1000.0000 mL | Freq: Once | INTRAVENOUS | Status: AC
Start: 2016-09-22 — End: 2016-09-22
  Administered 2016-09-22: 1000 mL via INTRAVENOUS

## 2016-09-21 MED ORDER — DEXTROSE 5 % IV SOLN
500.0000 mg | Freq: Once | INTRAVENOUS | Status: AC
  Administered 2016-09-22: 500 mg via INTRAVENOUS
  Filled 2016-09-21: qty 500

## 2016-09-21 MED ORDER — METHYLPREDNISOLONE SODIUM SUCC 125 MG IJ SOLR
125.0000 mg | Freq: Once | INTRAMUSCULAR | Status: AC
  Administered 2016-09-21: 125 mg via INTRAVENOUS
  Filled 2016-09-21: qty 2

## 2016-09-21 MED ORDER — SODIUM CHLORIDE 0.9 % IV BOLUS (SEPSIS)
1000.0000 mL | Freq: Once | INTRAVENOUS | Status: AC
  Administered 2016-09-22: 1000 mL via INTRAVENOUS

## 2016-09-21 MED ORDER — MECLIZINE HCL 25 MG PO TABS
25.0000 mg | ORAL_TABLET | Freq: Once | ORAL | Status: AC
  Administered 2016-09-22: 25 mg via ORAL
  Filled 2016-09-21: qty 1

## 2016-09-21 MED ORDER — CEFTRIAXONE SODIUM 1 G IJ SOLR
1.0000 g | Freq: Once | INTRAMUSCULAR | Status: AC
  Administered 2016-09-21: 1 g via INTRAVENOUS
  Filled 2016-09-21: qty 10

## 2016-09-21 NOTE — ED Triage Notes (Signed)
Pt from home w/ c/o increased SHOB and difficulty breathing. Pt sts he has had a productive cough x2 months. Per EMS family and friends state pt has been more lethargic than usual. Pt lethargic on arrival but A&O x4. Hx of CVA w/ R sided deficits. Denies pain @ this time.

## 2016-09-21 NOTE — ED Provider Notes (Signed)
[facility] DEPT Provider Note   CSN: [id number] Arrival date & time: 09/21/16  2215     History   Chief Complaint Chief Complaint  Patient presents with  . Shortness of Breath    HPI Jacob Joseph is a 61 y.o. male.  Pt presents to the ED with SOB.  His past medical history is significant for HIV.  He has been compliant with his meds (last CD4 680 and HIV viral load <20 on 08/24/16).  Pt also has a hx of PE and is on Savaysa for that.  The pt said that his cough has been going on for about 2 months.  [name] friends told EMS that he has been more lethargic than normal.  Pt said he feels dizzy.        Past Medical History:  Diagnosis Date  . Calcium oxalate crystals in urine 12/23/2011   Asymptomatic, no hematuria. Advised to take plenty of water.  . Depression   . Diabetes mellitus without complication ([place]) 123456   pre diabetic  . Hepatitis C   . HIV (human immunodeficiency virus infection) ([place])   . Hypertension   . Peripheral arterial disease ([place])    ooccluded left SFA by duplex ultrasound, tibial disease left greater than right  . Stroke ([place]) 11/2011   Carotids Doppler negative. Right sided weakness resolved, initially on Plavix for 3 months and then continued with ASA.    . TB lung, latent 1988   Treated    Patient Active Problem List   Diagnosis Date Noted  . History of pulmonary embolus (PE) 09/22/2016  . History of prostate cancer 09/22/2016  . CAP (community acquired pneumonia) 09/22/2016  . BPH associated with nocturia 06/19/2016  . PE (pulmonary thromboembolism) ([place]) 05/12/2016  . Substance abuse 01/27/2016  . Vitamin D deficiency 12/05/2015  . Hyperlipidemia 01/28/2015  . Cataracts, bilateral 08/29/2014  . Diabetes mellitus type 2 with complications ([place]) 123456  . Community acquired pneumonia 09/28/2013  . COPD, moderate ([place]) 05/31/2013  . Bilateral lower extremity pain 05/08/2013  . Complex Lt Renal Cyst 03/22/2013  . Preventive measure  05/30/2012  . Normocytic anemia 03/08/2012  . CKD Stage 2 03/08/2012  . ERECTILE DYSFUNCTION 04/25/2008  . Human immunodeficiency virus (HIV) disease ([place]) 04/27/2006  . Smoking greater than 30 pack years 04/27/2006  . Major depressive disorder, recurrent episode ([place]) 04/27/2006  . Essential hypertension 04/27/2006  . DEGENERATIVE JOINT DISEASE 04/27/2006  . Chronic Low Back Pain 04/27/2006    Past Surgical History:  Procedure Laterality Date  . HERNIA REPAIR    . INGUINAL HERNIA REPAIR  01/16/2012   Procedure: HERNIA REPAIR INGUINAL INCARCERATED;  Surgeon: Zenovia Jarred, MD;  Location: [facility];  Service: General;  Laterality: Right;  . TEE WITHOUT CARDIOVERSION  12/07/2011   Procedure: TRANSESOPHAGEAL ECHOCARDIOGRAM (TEE);  Surgeon: Birdie Riddle, MD;  Location: [facility] ENDOSCOPY;  Service: Cardiovascular;  Laterality: N/A;       Home Medications    Prior to Admission medications   Medication Sig Start Date End Date Taking? Authorizing Provider  albuterol (PROAIR HFA) 108 (90 Base) MCG/ACT inhaler INHALE 2 PUFFS INTO THE LUNGS EVERY 6 HOURS AS NEEDED FOR WHEEZING OR SHORTNESS OF BREATH, TO NOT USE EVERY DAY, USE ONLY IF NEEDED 08/28/16   Burgess Estelle, MD  aspirin 81 MG EC tablet Take 1 tablet (81 mg total) by mouth daily. 01/27/16   Burgess Estelle, MD  Budesonide 90 MCG/ACT inhaler Inhale 1-2 puffs into the lungs 2 (two) times  daily. 08/21/16   Burgess Estelle, MD  cholecalciferol (VITAMIN D) 1000 units tablet Take 1 tablet (1,000 Units total) by mouth daily. 01/27/16   Burgess Estelle, MD  citalopram (CELEXA) 20 MG tablet Take 20 mg by mouth daily. 10/25/14   Historical Provider, MD  diclofenac sodium (VOLTAREN) 1 % GEL Apply 4 g topically 4 (four) times daily. Patient taking differently: Apply 4 g topically 4 (four) times daily as needed (pain).  01/27/16   Burgess Estelle, MD  edoxaban (SAVAYSA) 60 MG TABS tablet Take 60 mg by mouth daily. 06/01/16   Burgess Estelle, MD  gabapentin (NEURONTIN) 300  MG capsule Take 2 capsules (600 mg total) by mouth at bedtime. 07/24/16   Burgess Estelle, MD  GENVOYA 150-150-200-10 MG TABS tablet TAKE 1 TABLET BY MOUTH DAILY WITH BREAKFAST(TAKE WITH PREZISTA) CALL [phone] FOR MISSED APPOINTMENT 08/24/16   Michel Bickers, MD  hydroxypropyl methylcellulose / hypromellose (ISOPTO TEARS / GONIOVISC) 2.5 % ophthalmic solution Place 1 drop into both eyes as needed for dry eyes.    Historical Provider, MD  lisinopril (PRINIVIL,ZESTRIL) 5 MG tablet Take 1 tablet (5 mg total) by mouth daily. 01/28/16   Burgess Estelle, MD  lisinopril (PRINIVIL,ZESTRIL) 5 MG tablet TAKE 1 TABLET BY MOUTH DAILY Patient not taking: Reported on 09/08/2016 08/28/16   Burgess Estelle, MD  metFORMIN (GLUCOPHAGE) 500 MG tablet Take 1 tablet (500 mg total) by mouth daily with breakfast. 12/02/15 12/01/16  Burgess Estelle, MD  [facility] DELICA LANCETS FINE MISC CHECK BLOOD SUGAR ONE TIME DAILY BEFORE BREAKFAST 09/08/16   Burgess Estelle, MD  PREZISTA 800 MG tablet TAKE 1 TABLET BY MOUTH EVERY DAY WITH BREAKFAST 07/24/16   Michel Bickers, MD  QUEtiapine (SEROQUEL) 300 MG tablet Take 300 mg by mouth 2 (two) times daily.  11/11/15   Historical Provider, MD  rosuvastatin (CRESTOR) 10 MG tablet Take 1 tablet (10 mg total) by mouth at bedtime. 03/16/16 03/16/17  Burgess Estelle, MD  sildenafil (VIAGRA) 50 MG tablet Take 1 tablet (50 mg total) by mouth daily as needed for erectile dysfunction. Patient not taking: Reported on 09/08/2016 04/15/15   Burgess Estelle, MD  tamsulosin [facility]) 0.4 MG CAPS capsule Take 1 capsule (0.4 mg total) by mouth daily after breakfast. Patient not taking: Reported on 09/08/2016 06/19/16   Burgess Estelle, MD  valACYclovir (VALTREX) 500 MG tablet TAKE 1 TABLET BY MOUTH DAILY Patient not taking: Reported on 09/08/2016 12/31/15   Michel Bickers, MD    Family History Family History  Problem Relation Age of Onset  . Hypertension Mother   . Heart attack Mother   . Stroke Mother   . Hypertension Father   .  Cancer Father     Social History Social History  Substance Use Topics  . Smoking status: Current Every Day Smoker    Packs/day: 0.50    Years: 41.00    Types: Cigarettes    Last attempt to quit: 11/18/2014  . Smokeless tobacco: Never Used  . Alcohol use No     Allergies   Patient has no known allergies.   Review of Systems Review of Systems  Respiratory: Positive for shortness of breath.   All other systems reviewed and are negative.    Physical Exam Updated Vital Signs BP 154/82   Pulse (!) 135   Temp 100.3 F (37.9 C) (Oral)   Resp (!) 32   Ht 6' (1.829 m)   Wt 210 lb (95.3 kg)   SpO2 97%   BMI 28.48 kg/m  Physical Exam  Constitutional: He is oriented to person, place, and time. He appears well-developed and well-nourished.  HENT:  Head: Normocephalic and atraumatic.  Right Ear: External ear normal.  Left Ear: External ear normal.  Nose: Nose normal.  Mouth/Throat: Oropharynx is clear and moist.  Eyes: Conjunctivae are normal. Pupils are equal, round, and reactive to light.  Neck: Normal range of motion. Neck supple.  Cardiovascular: Regular rhythm, normal heart sounds and intact distal pulses.  Tachycardia present.   Pulmonary/Chest: He has wheezes.  Abdominal: Soft. Bowel sounds are normal.  Musculoskeletal: Normal range of motion.  Neurological: He is alert and oriented to person, place, and time.  Skin: Skin is warm.  Psychiatric: He has a normal mood and affect. His behavior is normal. Judgment and thought content normal.  Nursing note and vitals reviewed.    ED Treatments / Results  Labs (all labs ordered are listed, but only abnormal results are displayed) Labs Reviewed  COMPREHENSIVE METABOLIC PANEL - Abnormal; Notable for the following:       Result Value   Sodium 134 (*)    CO2 21 (*)    Glucose, Bld 147 (*)    Creatinine, Ser 1.58 (*)    AST 54 (*)    GFR calc non Af Amer 46 (*)    GFR calc Af Amer 54 (*)    All other components  within normal limits  CBC WITH DIFFERENTIAL/PLATELET - Abnormal; Notable for the following:    Hemoglobin 11.9 (*)    HCT 36.5 (*)    Monocytes Absolute 1.1 (*)    All other components within normal limits  D-DIMER, QUANTITATIVE (NOT AT [facility]) - Abnormal; Notable for the following:    D-Dimer, Quant 0.51 (*)    All other components within normal limits  I-STAT CG4 LACTIC ACID, ED - Abnormal; Notable for the following:    Lactic Acid, Venous 2.80 (*)    All other components within normal limits  CULTURE, BLOOD (ROUTINE X 2)  CULTURE, BLOOD (ROUTINE X 2)  BRAIN NATRIURETIC PEPTIDE  TROPONIN I  URINALYSIS, ROUTINE W REFLEX MICROSCOPIC  RAPID URINE DRUG SCREEN, HOSP PERFORMED    EKG  EKG Interpretation None       Radiology Dg Chest 2 View  Result Date: 09/21/2016 CLINICAL DATA:  Shortness of breath and weakness EXAM: CHEST  2 VIEW COMPARISON:  05/11/2016 FINDINGS: Linear atelectasis or scarring at both lung bases, improved compared to prior study. No pleural effusion. Stable borderline cardiomegaly. Mild atherosclerosis. No pneumothorax. IMPRESSION: Mild linear atelectasis or scarring at the lung bases. Electronically Signed   By: Donavan Foil M.D.   On: 09/21/2016 22:49    Procedures Procedures (including critical care time)  Medications Ordered in ED Medications  cefTRIAXone (ROCEPHIN) 1 g in dextrose 5 % 50 mL IVPB (1 g Intravenous New Bag/Given 09/21/16 2358)  azithromycin (ZITHROMAX) 500 mg in dextrose 5 % 250 mL IVPB (not administered)  sodium chloride 0.9 % bolus 1,000 mL (1,000 mLs Intravenous New Bag/Given 09/22/16 0007)    And  sodium chloride 0.9 % bolus 1,000 mL (not administered)    And  sodium chloride 0.9 % bolus 1,000 mL (not administered)  albuterol (PROVENTIL,VENTOLIN) solution continuous neb (10 mg/hr Nebulization Given 09/21/16 2252)  methylPREDNISolone sodium succinate (SOLU-MEDROL) 125 mg/2 mL injection 125 mg (125 mg Intravenous Given 09/21/16 2355)  meclizine  (ANTIVERT) tablet 25 mg (25 mg Oral Given 09/22/16 0002)  acetaminophen (TYLENOL) tablet 1,000 mg (1,000 mg Oral Given 09/22/16  0003)  sodium chloride 0.9 % bolus 1,000 mL (1,000 mLs Intravenous New Bag/Given 09/21/16 2331)     Initial Impression / Assessment and Plan / ED Course  I have reviewed the triage vital signs and the nursing notes.  Pertinent labs & imaging results that were available during my care of the patient were reviewed by me and considered in my medical decision making (see chart for details).     Code sepsis called.  BP ok, so no hypotension issues.  Pt d/w IM residents for admission.    Final Clinical Impressions(s) / ED Diagnoses   Final diagnoses:  Community acquired pneumonia, unspecified laterality  Sepsis, due to unspecified organism [facility])  COPD exacerbation ([place])    New Prescriptions New Prescriptions   No medications on file     Isla Pence, MD 09/22/16 0025

## 2016-09-22 ENCOUNTER — Encounter (HOSPITAL_COMMUNITY): Payer: Self-pay | Admitting: *Deleted

## 2016-09-22 ENCOUNTER — Inpatient Hospital Stay (HOSPITAL_COMMUNITY): Payer: Medicare Other

## 2016-09-22 DIAGNOSIS — N179 Acute kidney failure, unspecified: Secondary | ICD-10-CM | POA: Diagnosis not present

## 2016-09-22 DIAGNOSIS — Z86711 Personal history of pulmonary embolism: Secondary | ICD-10-CM | POA: Diagnosis not present

## 2016-09-22 DIAGNOSIS — I129 Hypertensive chronic kidney disease with stage 1 through stage 4 chronic kidney disease, or unspecified chronic kidney disease: Secondary | ICD-10-CM

## 2016-09-22 DIAGNOSIS — R0602 Shortness of breath: Secondary | ICD-10-CM | POA: Diagnosis present

## 2016-09-22 DIAGNOSIS — F339 Major depressive disorder, recurrent, unspecified: Secondary | ICD-10-CM | POA: Diagnosis present

## 2016-09-22 DIAGNOSIS — Z7984 Long term (current) use of oral hypoglycemic drugs: Secondary | ICD-10-CM | POA: Diagnosis not present

## 2016-09-22 DIAGNOSIS — J1000 Influenza due to other identified influenza virus with unspecified type of pneumonia: Secondary | ICD-10-CM | POA: Diagnosis present

## 2016-09-22 DIAGNOSIS — F1410 Cocaine abuse, uncomplicated: Secondary | ICD-10-CM

## 2016-09-22 DIAGNOSIS — R51 Headache: Secondary | ICD-10-CM

## 2016-09-22 DIAGNOSIS — E1122 Type 2 diabetes mellitus with diabetic chronic kidney disease: Secondary | ICD-10-CM | POA: Diagnosis not present

## 2016-09-22 DIAGNOSIS — N183 Chronic kidney disease, stage 3 (moderate): Secondary | ICD-10-CM | POA: Diagnosis not present

## 2016-09-22 DIAGNOSIS — Z8249 Family history of ischemic heart disease and other diseases of the circulatory system: Secondary | ICD-10-CM | POA: Diagnosis not present

## 2016-09-22 DIAGNOSIS — Z7982 Long term (current) use of aspirin: Secondary | ICD-10-CM | POA: Diagnosis not present

## 2016-09-22 DIAGNOSIS — J101 Influenza due to other identified influenza virus with other respiratory manifestations: Secondary | ICD-10-CM | POA: Diagnosis present

## 2016-09-22 DIAGNOSIS — Z21 Asymptomatic human immunodeficiency virus [HIV] infection status: Secondary | ICD-10-CM | POA: Diagnosis not present

## 2016-09-22 DIAGNOSIS — E559 Vitamin D deficiency, unspecified: Secondary | ICD-10-CM | POA: Diagnosis present

## 2016-09-22 DIAGNOSIS — R42 Dizziness and giddiness: Secondary | ICD-10-CM

## 2016-09-22 DIAGNOSIS — E785 Hyperlipidemia, unspecified: Secondary | ICD-10-CM | POA: Diagnosis present

## 2016-09-22 DIAGNOSIS — J440 Chronic obstructive pulmonary disease with acute lower respiratory infection: Secondary | ICD-10-CM | POA: Diagnosis present

## 2016-09-22 DIAGNOSIS — Z823 Family history of stroke: Secondary | ICD-10-CM | POA: Diagnosis not present

## 2016-09-22 DIAGNOSIS — F329 Major depressive disorder, single episode, unspecified: Secondary | ICD-10-CM

## 2016-09-22 DIAGNOSIS — J189 Pneumonia, unspecified organism: Secondary | ICD-10-CM | POA: Diagnosis present

## 2016-09-22 DIAGNOSIS — C61 Malignant neoplasm of prostate: Secondary | ICD-10-CM | POA: Diagnosis not present

## 2016-09-22 DIAGNOSIS — I679 Cerebrovascular disease, unspecified: Secondary | ICD-10-CM

## 2016-09-22 DIAGNOSIS — Z7901 Long term (current) use of anticoagulants: Secondary | ICD-10-CM

## 2016-09-22 DIAGNOSIS — E1151 Type 2 diabetes mellitus with diabetic peripheral angiopathy without gangrene: Secondary | ICD-10-CM | POA: Diagnosis present

## 2016-09-22 DIAGNOSIS — J441 Chronic obstructive pulmonary disease with (acute) exacerbation: Secondary | ICD-10-CM

## 2016-09-22 DIAGNOSIS — Z79899 Other long term (current) drug therapy: Secondary | ICD-10-CM

## 2016-09-22 DIAGNOSIS — B20 Human immunodeficiency virus [HIV] disease: Secondary | ICD-10-CM | POA: Diagnosis present

## 2016-09-22 DIAGNOSIS — F17210 Nicotine dependence, cigarettes, uncomplicated: Secondary | ICD-10-CM | POA: Diagnosis not present

## 2016-09-22 DIAGNOSIS — B1920 Unspecified viral hepatitis C without hepatic coma: Secondary | ICD-10-CM | POA: Diagnosis present

## 2016-09-22 DIAGNOSIS — G9389 Other specified disorders of brain: Secondary | ICD-10-CM

## 2016-09-22 DIAGNOSIS — E119 Type 2 diabetes mellitus without complications: Secondary | ICD-10-CM | POA: Diagnosis not present

## 2016-09-22 DIAGNOSIS — Z8546 Personal history of malignant neoplasm of prostate: Secondary | ICD-10-CM | POA: Insufficient documentation

## 2016-09-22 DIAGNOSIS — I10 Essential (primary) hypertension: Secondary | ICD-10-CM | POA: Diagnosis not present

## 2016-09-22 DIAGNOSIS — Z809 Family history of malignant neoplasm, unspecified: Secondary | ICD-10-CM | POA: Diagnosis not present

## 2016-09-22 DIAGNOSIS — E860 Dehydration: Secondary | ICD-10-CM | POA: Diagnosis present

## 2016-09-22 LAB — COMPREHENSIVE METABOLIC PANEL
ALBUMIN: 3.6 g/dL (ref 3.5–5.0)
ALK PHOS: 76 U/L (ref 38–126)
ALT: 32 U/L (ref 17–63)
ANION GAP: 10 (ref 5–15)
AST: 54 U/L — AB (ref 15–41)
BUN: 13 mg/dL (ref 6–20)
CO2: 21 mmol/L — AB (ref 22–32)
Calcium: 9.2 mg/dL (ref 8.9–10.3)
Chloride: 103 mmol/L (ref 101–111)
Creatinine, Ser: 1.58 mg/dL — ABNORMAL HIGH (ref 0.61–1.24)
GFR calc Af Amer: 54 mL/min — ABNORMAL LOW (ref >60)
GFR calc non Af Amer: 46 mL/min — ABNORMAL LOW (ref >60)
GLUCOSE: 147 mg/dL — AB (ref 65–99)
POTASSIUM: 5.1 mmol/L (ref 3.5–5.1)
SODIUM: 134 mmol/L — AB (ref 135–145)
Total Bilirubin: 0.7 mg/dL (ref 0.3–1.2)
Total Protein: 8 g/dL (ref 6.5–8.1)

## 2016-09-22 LAB — RAPID URINE DRUG SCREEN, HOSP PERFORMED
AMPHETAMINES: NOT DETECTED
BARBITURATES: NOT DETECTED
Benzodiazepines: NOT DETECTED
COCAINE: NOT DETECTED
OPIATES: NOT DETECTED
TETRAHYDROCANNABINOL: NOT DETECTED

## 2016-09-22 LAB — MRSA PCR SCREENING: MRSA by PCR: NEGATIVE

## 2016-09-22 LAB — GLUCOSE, CAPILLARY
GLUCOSE-CAPILLARY: 168 mg/dL — AB (ref 65–99)
Glucose-Capillary: 193 mg/dL — ABNORMAL HIGH (ref 65–99)
Glucose-Capillary: 220 mg/dL — ABNORMAL HIGH (ref 65–99)
Glucose-Capillary: 175 mg/dL — ABNORMAL HIGH (ref 65–99)

## 2016-09-22 LAB — I-STAT CG4 LACTIC ACID, ED: Lactic Acid, Venous: 2.14 mmol/L (ref 0.5–1.9)

## 2016-09-22 LAB — TROPONIN I: Troponin I: <0.03 ng/mL (ref <0.03)

## 2016-09-22 LAB — INFLUENZA PANEL BY PCR (TYPE A & B)
INFLAPCR: POSITIVE — AB
Influenza B By PCR: NEGATIVE

## 2016-09-22 LAB — CBG MONITORING, ED: Glucose-Capillary: 208 mg/dL — ABNORMAL HIGH (ref 65–99)

## 2016-09-22 LAB — BRAIN NATRIURETIC PEPTIDE: B Natriuretic Peptide: 13 pg/mL (ref 0.0–100.0)

## 2016-09-22 LAB — STREP PNEUMONIAE URINARY ANTIGEN: Strep Pneumo Urinary Antigen: NEGATIVE

## 2016-09-22 MED ORDER — CITALOPRAM HYDROBROMIDE 20 MG PO TABS
20.0000 mg | ORAL_TABLET | Freq: Every day | ORAL | Status: DC
  Administered 2016-09-22 – 2016-09-23 (×2): 20 mg via ORAL
  Filled 2016-09-22 (×2): qty 1

## 2016-09-22 MED ORDER — DARUNAVIR ETHANOLATE 800 MG PO TABS
800.0000 mg | ORAL_TABLET | Freq: Every day | ORAL | Status: DC
  Administered 2016-09-22: 800 mg via ORAL
  Filled 2016-09-22 (×2): qty 1

## 2016-09-22 MED ORDER — ACETAMINOPHEN 325 MG PO TABS
650.0000 mg | ORAL_TABLET | Freq: Four times a day (QID) | ORAL | Status: DC | PRN
  Administered 2016-09-22: 650 mg via ORAL
  Filled 2016-09-22: qty 2

## 2016-09-22 MED ORDER — ELVITEG-COBIC-EMTRICIT-TENOFAF 150-150-200-10 MG PO TABS
1.0000 | ORAL_TABLET | Freq: Every day | ORAL | Status: DC
  Administered 2016-09-22: 1 via ORAL
  Filled 2016-09-22 (×2): qty 1

## 2016-09-22 MED ORDER — VALACYCLOVIR HCL 500 MG PO TABS
500.0000 mg | ORAL_TABLET | Freq: Every day | ORAL | Status: DC
  Administered 2016-09-22 – 2016-09-23 (×2): 500 mg via ORAL
  Filled 2016-09-22 (×3): qty 1

## 2016-09-22 MED ORDER — ORAL CARE MOUTH RINSE
15.0000 mL | Freq: Two times a day (BID) | OROMUCOSAL | Status: DC
  Administered 2016-09-22 (×2): 15 mL via OROMUCOSAL

## 2016-09-22 MED ORDER — NICOTINE 14 MG/24HR TD PT24
14.0000 mg | MEDICATED_PATCH | Freq: Every day | TRANSDERMAL | Status: DC
  Administered 2016-09-22 – 2016-09-23 (×2): 14 mg via TRANSDERMAL
  Filled 2016-09-22 (×2): qty 1

## 2016-09-22 MED ORDER — ALBUTEROL SULFATE HFA 108 (90 BASE) MCG/ACT IN AERS: 1.0000 | INHALATION_SPRAY | Freq: Four times a day (QID) | RESPIRATORY_TRACT | Status: DC | PRN

## 2016-09-22 MED ORDER — DEXTROSE 5 % IV SOLN: 500.0000 mg | INTRAVENOUS | Status: DC

## 2016-09-22 MED ORDER — GUAIFENESIN-DM 100-10 MG/5ML PO SYRP
5.0000 mL | ORAL_SOLUTION | ORAL | Status: DC | PRN
  Administered 2016-09-22 – 2016-09-23 (×3): 5 mL via ORAL
  Filled 2016-09-22 (×3): qty 5

## 2016-09-22 MED ORDER — INSULIN ASPART 100 UNIT/ML ~~LOC~~ SOLN
0.0000 [IU] | Freq: Three times a day (TID) | SUBCUTANEOUS | Status: DC
  Administered 2016-09-22: 2 [IU] via SUBCUTANEOUS
  Administered 2016-09-22: 3 [IU] via SUBCUTANEOUS
  Administered 2016-09-22: 2 [IU] via SUBCUTANEOUS

## 2016-09-22 MED ORDER — ROSUVASTATIN CALCIUM 10 MG PO TABS
10.0000 mg | ORAL_TABLET | Freq: Every day | ORAL | Status: DC
  Administered 2016-09-22: 10 mg via ORAL
  Filled 2016-09-22 (×2): qty 1

## 2016-09-22 MED ORDER — ACETAMINOPHEN 650 MG RE SUPP: 650.0000 mg | Freq: Four times a day (QID) | RECTAL | Status: DC | PRN

## 2016-09-22 MED ORDER — INSULIN ASPART 100 UNIT/ML ~~LOC~~ SOLN: 0.0000 [IU] | Freq: Every day | SUBCUTANEOUS | Status: DC

## 2016-09-22 MED ORDER — ASPIRIN EC 81 MG PO TBEC
81.0000 mg | DELAYED_RELEASE_TABLET | Freq: Every day | ORAL | Status: DC
  Administered 2016-09-22 – 2016-09-23 (×2): 81 mg via ORAL
  Filled 2016-09-22 (×2): qty 1

## 2016-09-22 MED ORDER — OSELTAMIVIR PHOSPHATE 75 MG PO CAPS
75.0000 mg | ORAL_CAPSULE | Freq: Two times a day (BID) | ORAL | Status: DC
  Administered 2016-09-22 – 2016-09-23 (×3): 75 mg via ORAL
  Filled 2016-09-22 (×6): qty 1

## 2016-09-22 MED ORDER — PREDNISONE 20 MG PO TABS
40.0000 mg | ORAL_TABLET | Freq: Every day | ORAL | Status: DC
  Administered 2016-09-22 – 2016-09-23 (×2): 40 mg via ORAL
  Filled 2016-09-22 (×2): qty 2

## 2016-09-22 MED ORDER — IPRATROPIUM-ALBUTEROL 0.5-2.5 (3) MG/3ML IN SOLN
3.0000 mL | Freq: Four times a day (QID) | RESPIRATORY_TRACT | Status: DC
  Administered 2016-09-22 – 2016-09-23 (×5): 3 mL via RESPIRATORY_TRACT
  Filled 2016-09-22 (×5): qty 3

## 2016-09-22 MED ORDER — EDOXABAN TOSYLATE 60 MG PO TABS
60.0000 mg | ORAL_TABLET | Freq: Every day | ORAL | Status: DC
  Administered 2016-09-22 – 2016-09-23 (×2): 60 mg via ORAL
  Filled 2016-09-22 (×3): qty 60

## 2016-09-22 MED ORDER — ALBUTEROL SULFATE (2.5 MG/3ML) 0.083% IN NEBU: 2.5000 mg | INHALATION_SOLUTION | Freq: Four times a day (QID) | RESPIRATORY_TRACT | Status: DC | PRN

## 2016-09-22 MED ORDER — QUETIAPINE FUMARATE 300 MG PO TABS
300.0000 mg | ORAL_TABLET | Freq: Two times a day (BID) | ORAL | Status: DC
  Administered 2016-09-22 – 2016-09-23 (×3): 300 mg via ORAL
  Filled 2016-09-22: qty 3
  Filled 2016-09-22 (×2): qty 1
  Filled 2016-09-22: qty 3
  Filled 2016-09-22 (×2): qty 1
  Filled 2016-09-22: qty 3

## 2016-09-22 MED ORDER — SODIUM CHLORIDE 0.9% FLUSH
3.0000 mL | Freq: Two times a day (BID) | INTRAVENOUS | Status: DC
  Administered 2016-09-22 – 2016-09-23 (×4): 3 mL via INTRAVENOUS

## 2016-09-22 MED ORDER — DEXTROSE 5 % IV SOLN: 1.0000 g | INTRAVENOUS | Status: DC

## 2016-09-22 NOTE — Progress Notes (Signed)
Subjective: Jacob Joseph is sleepy and slow to wake this morning, reports not sleeping well due to being admitted overnight. Breathing comfortably on nasal cannula. Has no complaints this morning. He is receiving treatment for influenza and COPD exacerbation.  Objective: Vital signs in last 24 hours: Vitals:   09/22/16 0500 09/22/16 0515 09/22/16 0530 09/22/16 0600  BP: 122/79 127/80 131/84 133/88  Pulse: 99 98 96 95  Resp: (!) 26 (!) 24 (!) 25 (!) 23  Temp:      TempSrc:      SpO2: 94% 94% 93% 93%  Weight:      Height:        Intake/Output Summary (Last 24 hours) at 09/22/16 0744 Last data filed at 09/22/16 0400  Gross per 24 hour  Intake                0 ml  Output              300 ml  Net             -300 ml    Physical Exam General appearance: Elderly African-American gentleman lying in bed, wakes briefly for conversation, often falls back asleep, in no distress HENT: Normocephalic, moist mucous membranes, nasal cannula on 2 L Cardiovascular: Regular rate and rhythm, no murmurs, rubs, gallops Respiratory/Chest: Mild crackles right base, no rhonchi or wheezing, normal work of breathing Abdomen: Soft, non-tender, non-distended Skin: Warm, dry, intact Ext: Normal bulk, no edema, well perfused  Labs / Imaging / Procedures: CBC Latest Ref Rng & Units 09/21/2016 08/24/2016 05/14/2016  WBC 4.0 - 10.5 K/uL 8.9 10.7 13.5(H)  Hemoglobin 13.0 - 17.0 g/dL 11.9(L) 12.2(L) -  Hematocrit 39.0 - 52.0 % 36.5(L) 37.4(L) 39.2  Platelets 150 - 400 K/uL 161 199 214   BMP Latest Ref Rng & Units 09/21/2016 08/24/2016 05/12/2016  Glucose 65 - 99 mg/dL 147(H) 157(H) 253(H)  BUN 6 - 20 mg/dL 13 16 13   Creatinine 0.61 - 1.24 mg/dL 1.58(H) 1.48(H) 1.38(H)  BUN/Creat Ratio 9 - 20 - - -  Sodium 135 - 145 mmol/L 134(L) 138 134(L)  Potassium 3.5 - 5.1 mmol/L 5.1 3.9 4.5  Chloride 101 - 111 mmol/L 103 105 103  CO2 22 - 32 mmol/L 21(L) 21 21(L)  Calcium 8.9 - 10.3 mg/dL 9.2 9.8 9.1   Dg Chest 2  View  Result Date: 09/21/2016 CLINICAL DATA:  Shortness of breath and weakness EXAM: CHEST  2 VIEW COMPARISON:  05/11/2016 FINDINGS: Linear atelectasis or scarring at both lung bases, improved compared to prior study. No pleural effusion. Stable borderline cardiomegaly. Mild atherosclerosis. No pneumothorax. IMPRESSION: Mild linear atelectasis or scarring at the lung bases. Electronically Signed   By: Donavan Foil M.D.   On: 09/21/2016 22:49   Ct Head Wo Contrast  Result Date: 09/22/2016 CLINICAL DATA:  Dizziness EXAM: CT HEAD WITHOUT CONTRAST TECHNIQUE: Contiguous axial images were obtained from the base of the skull through the vertex without intravenous contrast. COMPARISON:  MRI from 11/20/2014 FINDINGS: Brain: Chronic mild superficial and central atrophy. Chronic small vessel ischemic disease of periventricular white matter. More focal areas chronic areas of chronic encephalomalacia in the high right frontal subcortical white matter foot and left frontoparietal lobes. No acute large vascular territory infarction, intracranial hemorrhage, focal mass nor extra-axial fluid. Vascular: No hyperdense vessel or unexpected calcification. Bilateral carotid siphon calcifications. Skull: Normal. Negative for fracture or focal lesion. Incomplete posterior arch of C1, developmental in etiology. Sinuses/Orbits: No acute finding. Bilateral lens  replacement surgery. Other: None. IMPRESSION: No acute intracranial abnormality. Mild superficial central atrophy with chronic small vessel ischemic disease. Chronic encephalomalacia in the high anterior right frontal lobe and posterior left frontoparietal lobes. Electronically Signed   By: Ashley Royalty M.D.   On: 09/22/2016 01:44   Assessment/Plan:  Principal Problem:   CAP (community acquired pneumonia) Active Problems:   Human immunodeficiency virus (HIV) disease ([place])   Smoking greater than 30 pack years   Major depressive disorder, recurrent episode ([place])   Essential  hypertension   CKD Stage 2   COPD, moderate ([place])   Community acquired pneumonia   Diabetes mellitus type 2 with complications ([place])   Hyperlipidemia   Substance abuse   History of pulmonary embolus (PE)   History of prostate cancer   Influenza A  COPD exacerbation, influenza A positive, two-week history of nasal congestion and mild symptoms and acutely developed fatigue, dyspnea, productive cough, fevers chills. History of moderate COPD on pulmonary function testing. -- Tamiflu 75 mg twice a day for 5 days -- DuoNeb every 6 hours -- Albuterol when necessary -- Prednisone 40 mg for 5 days -- Droplet precautions -- Strep pneumo antigen negative, Legionella urine antigen pending -- Supplemental oxygen as needed to maintain SPO2 greater than 92%  History of pulmonary embolism, occurred in October 2017, recent dyspnea and no clinical evidence for DVT, 0.51 -- Continue Edoxaban daily  HIV, followed by Dr. Megan Salon, well controlled with CD4 count 680 and undetectable viral load in February 2018 -- Continue to avoid persistent  AKI, and CKD3, 1.58 from baseline of 1.3-4, likely mild dehydration -- Received IV fluids in the ED per sepsis protocol -- Trend BMP  Tobacco abuse -- nicotine patch daily as needed  Type 2 diabetes, well controlled with A1c 6.3 in December 2017 -- Hold home metformin -- SSI-S and CBGs 4 times a day before meals at bedtime  Depression -- continue home Seroquel twice a day and Celexa daily  Dispo: Anticipated discharge in approximately 1-2 day(s).    LOS: 0 days   Asencion Partridge, MD 09/22/2016, 7:44 AM Pager: [phone]

## 2016-09-22 NOTE — ED Notes (Signed)
Unable to obtain orthostatic vital signs due to patient weakness

## 2016-09-22 NOTE — ED Notes (Signed)
Admitting MD notified of repeat lactic results of 2.14

## 2016-09-22 NOTE — ED Notes (Signed)
Rn notified of I-stat lactic acid resulting 2.55. RN to page internal Medicine Doctor in regards to results.

## 2016-09-22 NOTE — Progress Notes (Signed)
Internal Medicine Attending:   I saw and examined the patient. I reviewed the resident's note and I agree with the resident's findings and plan as documented in the resident's note.  61 year old man hospital day #1 with a moderate COPD exacerbation due to influenza a. Doing relatively well, still requiring 2 L of supplemental oxygen at rest. Plan to continue with prednisone, Tamiflu, and bronchodilators. Wean oxygen as able. Lactates were minimally elevated on admission likely due to influenza and maybe home metformin use. I doubt true sepsis in this case. May be ready for discharge tomorrow if oxygen requirement resolves.

## 2016-09-22 NOTE — ED Notes (Signed)
Pt sleeping. O2 saturations dropped to high 80s.  Placed on 2 L via [place]

## 2016-09-22 NOTE — H&P (Signed)
Date: 09/22/2016               Patient Name:  Jacob Joseph MRN: [id number]  DOB: 27-Feb-1956 Age / Sex: 61 y.o., male   PCP: Burgess Estelle, MD         Medical Service: Internal Medicine Teaching Service         Attending Physician: Dr. Axel Filler, MD    First Contact: Dr. Wynetta Emery Pager: [phone]  Second Contact: Dr. Marlowe Sax Pager: [phone]       After Hours (After 5p/  First Contact Pager: [phone]  weekends / holidays): Second Contact Pager: [phone]   Chief Complaint: Worsening shortness of breath, Cough and malaise.  History of Present Illness: Jacob Joseph is a 61 y.o. man with past medical history significant for COPD, HIV (last CD4 680 and HIV viral load <20 on 08/24/16), recent diagnosis of prostate cancer, hypertension, diabetes, history of PE on edoxaban, cocaine and tobacco abuse came to ED with worsening cough, shortness of breath and generalized malaise for one day.  Patient reports having worsening cough with whitish sputum, nasal congestion and shortness of breath for 2 weeks. He woke up this morning with headache accompanied with dizziness(he described it as room spinning around him), worsened cough and shortness of breath, along with some subjective fever and generalized malaise. He denies any sick contacts.   He described his headache like his head is breaking, 9/10 in intensity, starts on back of his head and go around it, described it different than his usual headache, accompanied with dizziness, denies any change in vision, nausea or vomiting.He described his dizziness as room spinning around him, denies any ringing in the ears, also denies being lightheaded.  He do endorse having central chest pain 5-6/10 in intensity, non radiating, increases with coughing, not with deep breathing,  stating this chest pain is different than his pain and he had PE. He denies any lower extremity edema or calf tenderness.  He also reports worsening exertional dyspnea for 2  weeks, which got worse today to the point that he was feeling short of breath at rest too. He has an history of PE, states that he is compliant with his medicines.  Patient denies any palpitations, orthopnea, PND. He also states that his appetite is normal, denies any change in bowel movement or any urinary symptoms.  Meds:  Current Meds  Medication Sig  . albuterol (PROAIR HFA) 108 (90 Base) MCG/ACT inhaler INHALE 2 PUFFS INTO THE LUNGS EVERY 6 HOURS AS NEEDED FOR WHEEZING OR SHORTNESS OF BREATH, TO NOT USE EVERY DAY, USE ONLY IF NEEDED  . aspirin 81 MG EC tablet Take 1 tablet (81 mg total) by mouth daily.  . Budesonide 90 MCG/ACT inhaler Inhale 1-2 puffs into the lungs 2 (two) times daily.  . cholecalciferol (VITAMIN D) 1000 units tablet Take 1 tablet (1,000 Units total) by mouth daily.  . citalopram (CELEXA) 20 MG tablet Take 20 mg by mouth daily.  . diclofenac sodium (VOLTAREN) 1 % GEL Apply 4 g topically 4 (four) times daily. (Patient taking differently: Apply 4 g topically 4 (four) times daily as needed (pain). )  . edoxaban (SAVAYSA) 60 MG TABS tablet Take 60 mg by mouth daily.  [name] Kitchen gabapentin (NEURONTIN) 300 MG capsule Take 2 capsules (600 mg total) by mouth at bedtime.  . GENVOYA 150-150-200-10 MG TABS tablet TAKE 1 TABLET BY MOUTH DAILY WITH BREAKFAST(TAKE WITH PREZISTA) CALL [phone] FOR MISSED APPOINTMENT  . hydroxypropyl methylcellulose /  hypromellose (ISOPTO TEARS / GONIOVISC) 2.5 % ophthalmic solution Place 1 drop into both eyes as needed for dry eyes.  [name] Kitchen lisinopril (PRINIVIL,ZESTRIL) 5 MG tablet Take 1 tablet (5 mg total) by mouth daily.  . metFORMIN (GLUCOPHAGE) 500 MG tablet Take 1 tablet (500 mg total) by mouth daily with breakfast.  . ONETOUCH DELICA LANCETS FINE MISC CHECK BLOOD SUGAR ONE TIME DAILY BEFORE BREAKFAST  . PREZISTA 800 MG tablet TAKE 1 TABLET BY MOUTH EVERY DAY WITH BREAKFAST  . QUEtiapine (SEROQUEL) 300 MG tablet Take 300 mg by mouth 2 (two) times daily.    . rosuvastatin (CRESTOR) 10 MG tablet Take 1 tablet (10 mg total) by mouth at bedtime.  . tamsulosin (FLOMAX) 0.4 MG CAPS capsule Take 1 capsule (0.4 mg total) by mouth daily after breakfast.  . Tiotropium Bromide-Olodaterol (STIOLTO RESPIMAT) 2.5-2.5 MCG/ACT AERS Inhale 1 puff into the lungs 2 (two) times daily.  . valACYclovir (VALTREX) 500 MG tablet TAKE 1 TABLET BY MOUTH DAILY     Allergies: Allergies as of 09/21/2016  . (No Known Allergies)   Past Medical History:  Diagnosis Date  . Calcium oxalate crystals in urine 12/23/2011   Asymptomatic, no hematuria. Advised to take plenty of water.  . Depression   . Diabetes mellitus without complication ([place]) 123456   pre diabetic  . Hepatitis C   . HIV (human immunodeficiency virus infection) ([place])   . Hypertension   . Peripheral arterial disease ([place])    ooccluded left SFA by duplex ultrasound, tibial disease left greater than right  . Stroke ([place]) 11/2011   Carotids Doppler negative. Right sided weakness resolved, initially on Plavix for 3 months and then continued with ASA.    . TB lung, latent 1988   Treated    Family History: Father died of heart disease at age of 1.  Social History: Smokes about half pack per day for the last 15 years, Still uses cocaine, last use was one week ago. Denies alcohol use.  Review of Systems: A complete ROS was negative except as per HPI.   Physical Exam: Blood pressure 154/82, pulse (!) 135, temperature 100.3 F (37.9 C), temperature source Oral, resp. rate (!) 32, height 6' (1.829 m), weight 210 lb (95.3 kg), SpO2 97 %. Vitals:   09/21/16 2252 09/21/16 2315 09/22/16 0000 09/22/16 0015  BP:  169/86 142/78 154/82  Pulse:  (!) 132 (!) 136 (!) 135  Resp:  (!) 29 (!) 34 (!) 32  Temp:      TempSrc:      SpO2: 94% 97% 96% 97%  Weight:      Height:       General: Vital signs reviewed.  Patient is well-developed and well-nourished, in Mild distress and cooperative with exam.  Head:  Normocephalic and atraumatic. Eyes: EOMI, conjunctivae normal, excessive lacrimation ,no scleral icterus.  Neck: Supple, trachea midline, normal ROM, no JVD, masses, thyromegaly, or carotid bruit present.  Cardiovascular:Tachycardia, regular rhythm,no murmurs, gallops, or rubs. Pulmonary/Chest: A few transmitted upper respiratory sounds, chest clears up after coughing. Abdominal: Soft, non-tender, non-distended, BS +, no masses, organomegaly, or guarding present.  Extremities: No lower extremity edema bilaterally, no calf tenderness, pulses symmetric and intact bilaterally. No cyanosis or clubbing. Neurological: A&O x3, Strength is normal and symmetric bilaterally, cranial nerve II-XII are grossly intact, no focal motor deficit, sensory intact to light touch bilaterally.  Skin: Warm, dry and intact. No rashes or erythema. Psychiatric: Normal mood and affect. speech and behavior is normal. Cognition  and memory are normal.  Labs. CBC    Component Value Date/Time   WBC 8.9 09/21/2016 2310   RBC 4.23 09/21/2016 2310   HGB 11.9 (L) 09/21/2016 2310   HCT 36.5 (L) 09/21/2016 2310   HCT 39.2 05/14/2016 1515   PLT 161 09/21/2016 2310   PLT 214 05/14/2016 1515   MCV 86.3 09/21/2016 2310   MCV 85 05/14/2016 1515   MCH 28.1 09/21/2016 2310   MCHC 32.6 09/21/2016 2310   RDW 14.9 09/21/2016 2310   RDW 14.7 05/14/2016 1515   LYMPHSABS 1.1 09/21/2016 2310   LYMPHSABS 4.6 (H) 05/14/2016 1515   MONOABS 1.1 (H) 09/21/2016 2310   EOSABS 0.1 09/21/2016 2310   EOSABS 0.1 05/14/2016 1515   BASOSABS 0.0 09/21/2016 2310   BASOSABS 0.0 05/14/2016 1515   CMP Latest Ref Rng & Units 09/21/2016 08/24/2016 05/12/2016  Glucose 65 - 99 mg/dL 147(H) 157(H) 253(H)  BUN 6 - 20 mg/dL 13 16 13   Creatinine 0.61 - 1.24 mg/dL 1.58(H) 1.48(H) 1.38(H)  Sodium 135 - 145 mmol/L 134(L) 138 134(L)  Potassium 3.5 - 5.1 mmol/L 5.1 3.9 4.5  Chloride 101 - 111 mmol/L 103 105 103  CO2 22 - 32 mmol/L 21(L) 21 21(L)  Calcium 8.9  - 10.3 mg/dL 9.2 9.8 9.1  Total Protein 6.5 - 8.1 g/dL 8.0 7.6 -  Total Bilirubin 0.3 - 1.2 mg/dL 0.7 0.3 -  Alkaline Phos 38 - 126 U/L 76 59 -  AST 15 - 41 U/L 54(H) 18 -  ALT 17 - 63 U/L 32 16 -   Troponin. <0.03 BNP. 13 D-dimer, quantitative. 0.51 Lactic acid. 2.81>>2.14 Blood culture- pending  Influenza A positive  EKG: Sinus tachycardia with some baseline wandering in leads 2, 3 and aVF.  CXR: FINDINGS: Linear atelectasis or scarring at both lung bases, improved compared to prior study. No pleural effusion. Stable borderline cardiomegaly. Mild atherosclerosis. No pneumothorax.  IMPRESSION: Mild linear atelectasis or scarring at the lung bases.  CT head without contrast. FINDINGS: Brain: Chronic mild superficial and central atrophy. Chronic small vessel ischemic disease of periventricular white matter. More focal areas chronic areas of chronic encephalomalacia in the high right frontal subcortical white matter foot and left frontoparietal lobes. No acute large vascular territory infarction, intracranial hemorrhage, focal mass nor extra-axial fluid.  Vascular: No hyperdense vessel or unexpected calcification. Bilateral carotid siphon calcifications.  Skull: Normal. Negative for fracture or focal lesion. Incomplete posterior arch of C1, developmental in etiology.  Sinuses/Orbits: No acute finding. Bilateral lens replacement surgery.  Other: None.  IMPRESSION: No acute intracranial abnormality. Mild superficial central atrophy with chronic small vessel ischemic disease. Chronic encephalomalacia in the high anterior right frontal lobe and posterior left frontoparietal lobes.  Assessment & Plan by Problem:   Mr. Arbanas is a 61 year old male with past medical history of pulmonary embolism in fall 2017 on edoxaban, , moderate COPD and well controlled HIV who presents to the emergency department with complaint of shortness of breath, productive cough and  dizziness.  COPD exacerbation secondary to Influenza A: Patient has a two-week history of nasal congestion and mild cough. Today, patient states his cough worsened, became productive of yellow sputum, and was associated with dyspnea at rest and on exertion. He also admits to subjective fever and chills. Patient has a history of moderate COPD on pulmonary function testing. On exam, he is satting well on room air, but febrile, tachypneic and tachycardic. Patient also has rhonchi. He has no leukocytosis on exam. Chest x-ray  was without focal consolidation. Influenza A positive. His symptoms and presentation are most consistent with a COPD exacerbation likely secondary to Influenza A infection.  -Tamiflu 75 mg twice a day for 5 days -DuoNeb every 6 hours -Albuterol when necessary -Prednisone 40 mg for 5 days -Droplet precautions -Sputum culture -Check legionella and strep pneumonia -Blood cultures pending  History of pulmonary embolism: Patient was diagnosed with a pulmonary embolism in October 2017. At that time, he presented with left-sided chest pain. Patient has been on edoxaban and reports compliance. He states he has not missed any of his doses. He denies any new lower extremity edema or calf tenderness. He does admit to mild chest pain worse with cough, but not with inspiration. He states this does not feel like his previous pulmonary embolism. On exam, patient is satting well on room air. He is tachycardic. No evidence of lower extremity edema or calf tenderness. D-dimer 0.51, consistent with prior d-dimer in July 2016 when he was not found to have a pulmonary embolism. This borderline elevation may be secondary to cancer. Clinically, patient symptoms and physical exam are consistent with an infectious process. Recurrent pulmonary embolism is unlikely given his compliance with Edoxaban. If patient were to become hypoxic or developed significant chest pain or failed to improve despite current  appropriate therapy, would recommend CT angiogram. Wells criteria for PE is 4, moderate risk given tachycardia, history of PE, and malignancy, modified Geneva score is 10. -Continue edoxaban daily.  Headache and dizziness: Likely in the setting of sepsis. CT head was obtained in the emergency department which was negative for any acute intracranial abnormality, shows mild superficial central atrophy with chronic small vessel disease and chronic encephalomalacia in the high anterior right frontal lobe and posterior left frontoparietal lobes. As his symptoms are more consistent with vertigo, he was given a dose ofmeclizine 25 mg in ED, he reports no change in dizziness. Can be due to vestibular neuritis/vestibular migraine( not sure about H/O migraine) in the setting of acute infection. Treatment for vestibular neuritis is prednisone +/- valacyclovir. We started him on prednisone for COPD exacerbation, already on valacyclovir although low-dose for history of herpes.  HIV: Patient follows with Dr. Megan Salon. He reports compliance with Genvoya and Prezista. CD4 count in February 2018 was 680. HIV viral load was less than 20. RPR negative. -Continue Genvoya and Prezista  Prostate cancer: Patient was seen in the internal medicine clinic in fall 2017 with complaint of polyuria. Treatment for BPH was initiated, but PSA was checked. Level was 23.1. Patient was referred to urology and underwent prostate biopsy. Per patient, these results were positive for prostate cancer. We cannot see these records in Epic or care everywhere or the media tab. He states he is supposed to see his urologist tomorrow for follow-up. Patient recently underwent whole-body bone scan which showed an abnormal focus of uptake in the lateral portion of left lower rib which could be traumatic or neoplastic in origin. This is not clearly seen on the chest x-ray from today. -Follow-up with urology  Acute on chronic CK D stage III: Patient  has a baseline creatinine of 1.3-1.4. Today, creatinine 1.58, just above baseline. -Patient received 4 L of normal saline in the emergency department per sepsis protocol. -Trend basic metabolic panel  Tobacco abuse: Patient reports smoking half a pack per day for 15 years. -Counseled on smoking cessation -Nicotine patch daily  Substance abuse: Patient has a history of cocaine abuse. He states he last used cocaine 1-2  weeks ago. -Counseled on cessation -UDS pending  Type 2 diabetes: Hemoglobin A1c was 6.3 and December 2017. Patient is on metformin 500 mg daily. -Hold metformin -Sliding scale insulin sensitive  History of major depressive disorder. -Continue home dose of Seroquel 300 mg twice daily and Celexa 20 mg daily.  DVT/PE prophylaxis: edoxaban daily Code: Full FEN: Carb. modified diet   Dispo: Admit patient to Inpatient with expected length of stay greater than 2 midnights.  Signed: Lorella Nimrod, MD 09/22/2016, 1:13 AM  Pager: [phone]

## 2016-09-23 DIAGNOSIS — I10 Essential (primary) hypertension: Secondary | ICD-10-CM

## 2016-09-23 DIAGNOSIS — E119 Type 2 diabetes mellitus without complications: Secondary | ICD-10-CM

## 2016-09-23 LAB — GLUCOSE, CAPILLARY
GLUCOSE-CAPILLARY: 123 mg/dL — AB (ref 65–99)
Glucose-Capillary: 145 mg/dL — ABNORMAL HIGH (ref 65–99)

## 2016-09-23 LAB — BASIC METABOLIC PANEL
Anion gap: 13 (ref 5–15)
BUN: 17 mg/dL (ref 6–20)
CALCIUM: 8.7 mg/dL — AB (ref 8.9–10.3)
CHLORIDE: 106 mmol/L (ref 101–111)
CO2: 18 mmol/L — AB (ref 22–32)
CREATININE: 1.14 mg/dL (ref 0.61–1.24)
GFR calc Af Amer: >60 mL/min (ref >60)
GFR calc non Af Amer: >60 mL/min (ref >60)
GLUCOSE: 138 mg/dL — AB (ref 65–99)
Potassium: 4.5 mmol/L (ref 3.5–5.1)
Sodium: 137 mmol/L (ref 135–145)

## 2016-09-23 LAB — CBC
HCT: 34.5 % — ABNORMAL LOW (ref 39.0–52.0)
HEMOGLOBIN: 11.1 g/dL — AB (ref 13.0–17.0)
MCH: 27.5 pg (ref 26.0–34.0)
MCHC: 32.2 g/dL (ref 30.0–36.0)
MCV: 85.6 fL (ref 78.0–100.0)
PLATELETS: 178 10*3/uL (ref 150–400)
RBC: 4.03 MIL/uL — ABNORMAL LOW (ref 4.22–5.81)
RDW: 14.7 % (ref 11.5–15.5)
WBC: 14.3 10*3/uL — ABNORMAL HIGH (ref 4.0–10.5)

## 2016-09-23 MED ORDER — PREDNISONE 20 MG PO TABS: 40.0000 mg | ORAL_TABLET | Freq: Every day | ORAL | 0 refills | Status: AC

## 2016-09-23 MED ORDER — IPRATROPIUM-ALBUTEROL 0.5-2.5 (3) MG/3ML IN SOLN
3.0000 mL | Freq: Three times a day (TID) | RESPIRATORY_TRACT | Status: DC
  Filled 2016-09-23: qty 3

## 2016-09-23 MED ORDER — OSELTAMIVIR PHOSPHATE 75 MG PO CAPS: 75.0000 mg | ORAL_CAPSULE | Freq: Two times a day (BID) | ORAL | 0 refills | Status: AC

## 2016-09-23 MED ORDER — GUAIFENESIN-DM 100-10 MG/5ML PO SYRP: 5.0000 mL | ORAL_SOLUTION | ORAL | 0 refills | Status: DC | PRN

## 2016-09-23 NOTE — Discharge Summary (Signed)
Name: Jacob Joseph MRN: [id number] DOB: 1955/09/19 61 y.o. PCP: Jacob Estelle, MD  Date of Admission: 09/21/2016 10:15 PM Date of Discharge: 09/23/2016 Attending Physician: Jacob Filler, MD  Discharge Diagnosis: 1. COPD exacerbation  2. Influenza A 3. AKI 4. Tobacco use 5.. HIV 6 Type 2 diabetes mellitus  Principal Problem:   COPD exacerbation (HCC) Active Problems:   Human immunodeficiency virus (HIV) disease ([place])   Smoking greater than 30 pack years   Diabetes mellitus type 2 with complications ([place])   Influenza A   AKI (acute kidney injury) ([place])   Discharge Medications: Allergies as of 09/23/2016   No Known Allergies     Medication List    TAKE these medications   albuterol 108 (90 Base) MCG/ACT inhaler Commonly known as:  PROAIR HFA INHALE 2 PUFFS INTO THE LUNGS EVERY 6 HOURS AS NEEDED FOR WHEEZING OR SHORTNESS OF BREATH, TO NOT USE EVERY DAY, USE ONLY IF NEEDED   aspirin 81 MG EC tablet Take 1 tablet (81 mg total) by mouth daily.   Budesonide 90 MCG/ACT inhaler Inhale 1-2 puffs into the lungs 2 (two) times daily.   cholecalciferol 1000 units tablet Commonly known as:  VITAMIN D Take 1 tablet (1,000 Units total) by mouth daily.   citalopram 20 MG tablet Commonly known as:  CELEXA Take 20 mg by mouth daily.   diclofenac sodium 1 % Gel Commonly known as:  VOLTAREN Apply 4 g topically 4 (four) times daily. What changed:  when to take this  reasons to take this   edoxaban 60 MG Tabs tablet Commonly known as:  SAVAYSA Take 60 mg by mouth daily.   gabapentin 300 MG capsule Commonly known as:  NEURONTIN Take 2 capsules (600 mg total) by mouth at bedtime.   GENVOYA 150-150-200-10 MG Tabs tablet Generic drug:  elvitegravir-cobicistat-emtricitabine-tenofovir TAKE 1 TABLET BY MOUTH DAILY WITH BREAKFAST(TAKE WITH PREZISTA) CALL [phone] FOR MISSED APPOINTMENT   guaiFENesin-dextromethorphan 100-10 MG/5ML syrup Commonly known as:   ROBITUSSIN DM Take 5 mLs by mouth every 4 (four) hours as needed for cough.   hydroxypropyl methylcellulose / hypromellose 2.5 % ophthalmic solution Commonly known as:  ISOPTO TEARS / GONIOVISC Place 1 drop into both eyes as needed for dry eyes.   lisinopril 5 MG tablet Commonly known as:  PRINIVIL,ZESTRIL Take 1 tablet (5 mg total) by mouth daily.   metFORMIN 500 MG tablet Commonly known as:  GLUCOPHAGE Take 1 tablet (500 mg total) by mouth daily with breakfast.   ONETOUCH DELICA LANCETS FINE Misc CHECK BLOOD SUGAR ONE TIME DAILY BEFORE BREAKFAST   oseltamivir 75 MG capsule Commonly known as:  TAMIFLU Take 1 capsule (75 mg total) by mouth 2 (two) times daily.   predniSONE 20 MG tablet Commonly known as:  DELTASONE Take 2 tablets (40 mg total) by mouth daily with breakfast. Start taking on:  09/24/2016   PREZISTA 800 MG tablet Generic drug:  Darunavir Ethanolate TAKE 1 TABLET BY MOUTH EVERY DAY WITH BREAKFAST   QUEtiapine 300 MG tablet Commonly known as:  SEROQUEL Take 300 mg by mouth 2 (two) times daily.   rosuvastatin 10 MG tablet Commonly known as:  CRESTOR Take 1 tablet (10 mg total) by mouth at bedtime.   STIOLTO RESPIMAT 2.5-2.5 MCG/ACT Aers Generic drug:  Tiotropium Bromide-Olodaterol Inhale 1 puff into the lungs 2 (two) times daily.   tamsulosin 0.4 MG Caps capsule Commonly known as:  FLOMAX Take 1 capsule (0.4 mg total) by mouth daily after breakfast.  valACYclovir 500 MG tablet Commonly known as:  VALTREX TAKE 1 TABLET BY MOUTH DAILY       Disposition and follow-up:   Jacob Joseph was discharged from [facility] in Good condition.  At the hospital follow up visit please address:  1. COPD exacerbation - assess breathing and exercise tolerance, adherence with home inhalers  Influenza A - assess for resolution of dry cough, URI symptoms, and completion of Tamiflu course by 3/10  AKI - assess renal function  Tobacco use -  continues to smoke 0.5 pack per day, encourage/assist cessation  HIV - monitor per ID  T2DM - assess glycemic control  2.  Labs / imaging needed at time of follow-up: BMP, HbA1c  3.  Pending labs/ test needing follow-up: Blood cultures collected 3/5  Follow-up Appointments: Follow-up Information    Jacob Estelle, MD. Go on 09/28/2016.   Specialty:  Internal Medicine Why:  At 3:15 PM, please arrive 15 minutes early to check in. Contact information: [place] [id number] [phone]           Hospital Course by problem list: Principal Problem:   COPD exacerbation ([place]) Active Problems:   Human immunodeficiency virus (HIV) disease ([place])   Smoking greater than 30 pack years   Diabetes mellitus type 2 with complications ([place])   Influenza A   AKI (acute kidney injury) ([place])   1. COPD exacerbation Jacob Joseph is a 61 y.o. man with PMH COPD (FEV1 52%), HIV, prostate cancer, HTN, T2DM, history of VTE, cocaine and tobacco abuse who presented to the ED on 3/5 for cough and shortness of breath that suddenly started over one day. He reported having nasal congestion and upper respiratory type symptoms for about 2 weeks prior, but then suddenly had a worsening of his cough, headache, and shortness of breath yesterday morning. He reported increased yellow sputum production, worsening dyspnea at rest and with exertion, and was found to have tachypnea and coarse lung sounds on exam. He initially required oxygen via nasal cannula. Influenza A was found to be positive and he was started on Tamiflu. He was given scheduled Duoneb treatments and prednisone for 5 days, ending 3/10. His breathing greatly improved and he ambulated comfortably without desaturation on 3/7 and was deemed stable for discharge home with PCP follow up.  Influenza A - found to be Influenza A positive on 3/6 and was started on Tamiflu 75 mg BID to be continued for 5 days, ending 3/10. He experienced gradually  symptom relief, improved fatigue, dyspnea, and appetite during his stay.  COPD exacerbation - presented with worsening of his chronic cough,   AKI - presented with creatinine 1.58 from baseline 1.3, presumed pre-renal etiology in setting of poor po intake and URI / influenza. Received IV fluids and improved to creatinine 1.14 by day of discharge.  Tobacco use - provided nicotine patch during hospital stay and had discussion encouraging smoking cessation and COPD progression  HIV - well controlled in past, continued home Genvoya and Prezista during hospital stay  T2DM - home metformin held and blood sugar adequately controlled with sliding scale insulin during hospitalization  Discharge Vitals:   BP (!) 131/93 (BP Location: Right Arm)   Pulse 89   Temp 98.9 F (37.2 C) (Oral)   Resp (!) 21   Ht 6' (1.829 m)   Wt 221 lb (100.2 kg)   SpO2 95%   BMI 29.97 kg/m   Pertinent Labs, Studies, and Procedures:  CBC  Latest Ref Rng & Units 09/23/2016 09/21/2016 08/24/2016  WBC 4.0 - 10.5 K/uL 14.3(H) 8.9 10.7  Hemoglobin 13.0 - 17.0 g/dL 11.1(L) 11.9(L) 12.2(L)  Hematocrit 39.0 - 52.0 % 34.5(L) 36.5(L) 37.4(L)  Platelets 150 - 400 K/uL 178 161 199   BMP Latest Ref Rng & Units 09/23/2016 09/21/2016 08/24/2016  Glucose 65 - 99 mg/dL 138(H) 147(H) 157(H)  BUN 6 - 20 mg/dL 17 13 16   Creatinine 0.61 - 1.24 mg/dL 1.14 1.58(H) 1.48(H)  BUN/Creat Ratio 9 - 20 - - -  Sodium 135 - 145 mmol/L 137 134(L) 138  Potassium 3.5 - 5.1 mmol/L 4.5 5.1 3.9  Chloride 101 - 111 mmol/L 106 103 105  CO2 22 - 32 mmol/L 18(L) 21(L) 21  Calcium 8.9 - 10.3 mg/dL 8.7(L) 9.2 9.8   Results for KAIUS, DAINO (MRN [id number]) as of 09/24/2016 19:10  Ref. Range 09/22/2016 03:35  Influenza A By PCR Latest Ref Range: NEGATIVE  POSITIVE (A)    Dg Chest 2 View  Result Date: 09/21/2016 CLINICAL DATA:  Shortness of breath and weakness EXAM: CHEST  2 VIEW COMPARISON:  05/11/2016 FINDINGS: Linear atelectasis or scarring at both lung bases,  improved compared to prior study. No pleural effusion. Stable borderline cardiomegaly. Mild atherosclerosis. No pneumothorax. IMPRESSION: Mild linear atelectasis or scarring at the lung bases. Electronically Signed   By: Donavan Foil M.D.   On: 09/21/2016 22:49   Ct Head Wo Contrast  Result Date: 09/22/2016 CLINICAL DATA:  Dizziness EXAM: CT HEAD WITHOUT CONTRAST TECHNIQUE: Contiguous axial images were obtained from the base of the skull through the vertex without intravenous contrast. COMPARISON:  MRI from 11/20/2014 FINDINGS: Brain: Chronic mild superficial and central atrophy. Chronic small vessel ischemic disease of periventricular white matter. More focal areas chronic areas of chronic encephalomalacia in the high right frontal subcortical white matter foot and left frontoparietal lobes. No acute large vascular territory infarction, intracranial hemorrhage, focal mass nor extra-axial fluid. Vascular: No hyperdense vessel or unexpected calcification. Bilateral carotid siphon calcifications. Skull: Normal. Negative for fracture or focal lesion. Incomplete posterior arch of C1, developmental in etiology. Sinuses/Orbits: No acute finding. Bilateral lens replacement surgery. Other: None. IMPRESSION: No acute intracranial abnormality. Mild superficial central atrophy with chronic small vessel ischemic disease. Chronic encephalomalacia in the high anterior right frontal lobe and posterior left frontoparietal lobes. Electronically Signed   By: Ashley Royalty M.D.   On: 09/22/2016 01:44    Discharge Instructions: Discharge Instructions    Diet - low sodium heart healthy    Complete by:  As directed    Discharge instructions    Complete by:  As directed    Please make sure to take the Tamiflu twice daily over the next few days, finishing by Saturday, March 10. It will take several days to get over her influenza infection. Please make sure to drink plenty of fluids and get plenty of rest.  Because this  infection may have temporarily worsened your COPD, we are sending you home with prednisone to take for the next 3 days.  We have provided prescription for Robitussin cough syrup that he can use as needed for cough.  Please continue to take all of your previous medications and inhalers as prescribed.  Please follow-up with your primary care doctor next Monday.  Please work to quit smoking!!!!! This will decrease severity and frequency of these lung infections, and also lower your risk of heart attack and stroke.   Increase activity slowly    Complete by:  As directed       Signed: Asencion Partridge, MD 09/23/2016, 1:02 PM   Pager: [phone]

## 2016-09-23 NOTE — Progress Notes (Signed)
Subjective: Mr. Sax is feeling good today, reports feeling much better than when he came to the hospital. Slept well last night, and has been eating and drinking, and walked to the bathroom. He reports the cough syrup is helping. He has no complaints this morning  Objective: Vital signs in last 24 hours: Vitals:   09/22/16 1930 09/22/16 2048 09/23/16 0006 09/23/16 0513  BP: (!) 143/86  129/80 130/79  Pulse: 90  89 86  Resp: 13  16 18   Temp:   97.5 F (36.4 C) 97.8 F (36.6 C)  TempSrc:   Oral Oral  SpO2: 95% 97% 99% 98%  Weight:    221 lb (100.2 kg)  Height:        Intake/Output Summary (Last 24 hours) at 09/23/16 5093 Last data filed at 09/23/16 0544  Gross per 24 hour  Intake              480 ml  Output             2600 ml  Net            -2120 ml    Physical Exam General appearance: Elderly African-American gentleman resting comfortably in bed, in no distress HENT: Normocephalic, moist mucous membranes, nasal cannula on 2 L Cardiovascular: Regular rate and rhythm, no murmurs, rubs, gallops Respiratory/Chest: Lungs clear to auscultation, no rhonchi or wheezing, normal work of breathing Abdomen: Soft, non-tender, non-distended Skin: Warm, dry, intact Ext: Normal bulk, no edema, well perfused  Labs / Imaging / Procedures: CBC Latest Ref Rng & Units 09/23/2016 09/21/2016 08/24/2016  WBC 4.0 - 10.5 K/uL 14.3(H) 8.9 10.7  Hemoglobin 13.0 - 17.0 g/dL 11.1(L) 11.9(L) 12.2(L)  Hematocrit 39.0 - 52.0 % 34.5(L) 36.5(L) 37.4(L)  Platelets 150 - 400 K/uL 178 161 199   BMP Latest Ref Rng & Units 09/23/2016 09/21/2016 08/24/2016  Glucose 65 - 99 mg/dL 138(H) 147(H) 157(H)  BUN 6 - 20 mg/dL 17 13 16   Creatinine 0.61 - 1.24 mg/dL 1.14 1.58(H) 1.48(H)  BUN/Creat Ratio 9 - 20 - - -  Sodium 135 - 145 mmol/L 137 134(L) 138  Potassium 3.5 - 5.1 mmol/L 4.5 5.1 3.9  Chloride 101 - 111 mmol/L 106 103 105  CO2 22 - 32 mmol/L 18(L) 21(L) 21  Calcium 8.9 - 10.3 mg/dL 8.7(L) 9.2 9.8   No  results found. Assessment/Plan:  Active Problems:   Human immunodeficiency virus (HIV) disease ([place])   Diabetes mellitus type 2 with complications ([place])   Substance abuse   Influenza A   COPD exacerbation (HCC)  COPD exacerbation, influenza A positive, two-week history of nasal congestion and mild symptoms and acutely developed fatigue, dyspnea, productive cough, fevers chills. History of moderate COPD on pulmonary function testing. His symptoms have greatly improved -- Tamiflu 75 mg twice a day for 5 days - last dose 3/10 PM -- DuoNeb twice daily as needed -- Albuterol when necessary -- Prednisone 40 mg for 5 days - and 310 -- Droplet precautions -- Strep pneumo antigen negative, Legionella urine antigen pending -- Wean oxygen as tolerated -- Robitussin Cough syrup every 4 hours when necessary -- Check saturation with ambulation today, if no hypoxia and ambulatory - potential discharge this afternoon  History of pulmonary embolism, occurred in October 2017, recent dyspnea and no clinical evidence for DVT, 0.51 -- Continue Edoxaban daily  HIV, followed by Dr. Megan Salon, well controlled with CD4 count 680 and undetectable viral load in February 2018 -- Continue to avoid persistent  AKI, and CKD3, 1.58 from baseline of 1.3-4, likely mild dehydration, resolved 1.14 today -- Encouraged ongoing hydration  Tobacco abuse -- nicotine patch daily as needed  Type 2 diabetes, well controlled with A1c 6.3 in December 2017 -- Hold home metformin -- SSI-S and CBGs 4 times a day before meals at bedtime  Depression -- continue home Seroquel twice a day and Celexa daily  Dispo: Anticipated discharge today   LOS: 1 day   Asencion Partridge, MD 09/23/2016, 7:21 AM Pager: [phone]

## 2016-09-23 NOTE — Progress Notes (Signed)
Patient was counseled on his new medication, Tamiflu. He was counseled that this medication will not prevent him from spreading the virus and that he should practice good hand hygiene and cover any coughs to try to prevent the spread of the virus.   Quincy Sheehan and Wille Glaser  PharmD candidate 2018 [employer]

## 2016-09-23 NOTE — Care Management Note (Signed)
Case Management Note  Patient Details  Name: Jacob Joseph MRN: [id number] Date of Birth: 04-Jul-1956  Subjective/Objective:     Admitted with flu               Action/Plan:   PTA from home with wife completely independent.  Pt states he has active relationship with PCP and denies barriers to obtaining medication.  CM will continue to follow for discharge needs   Expected Discharge Date:                  Expected Discharge Plan:  Home/Self Care  In-House Referral:     Discharge planning Services  CM Consult  Post Acute Care Choice:    Choice offered to:     DME Arranged:    DME Agency:     HH Arranged:    HH Agency:     Status of Service:  In process, will continue to follow  If discussed at Long Length of Stay Meetings, dates discussed:    Additional Comments:  Maryclare Labrador, RN 09/23/2016, 11:11 AM

## 2016-09-23 NOTE — Progress Notes (Signed)
Pt is 96-99% on room air while walking around the unit. Tannya Gonet RN BSN, MSN. 09/23/2016 @11 .Caffie Damme

## 2016-09-23 NOTE — Discharge Instructions (Signed)
Steps to Quit Smoking Smoking tobacco can be bad for your health. It can also affect almost every organ in your body. Smoking puts you and people around you at risk for many serious long-lasting (chronic) diseases. Quitting smoking is hard, but it is one of the best things that you can do for your health. It is never too late to quit. What are the benefits of quitting smoking? When you quit smoking, you lower your risk for getting serious diseases and conditions. They can include:  Lung cancer or lung disease.  Heart disease.  Stroke.  Heart attack.  Not being able to have children (infertility).  Weak bones (osteoporosis) and broken bones (fractures). If you have coughing, wheezing, and shortness of breath, those symptoms may get better when you quit. You may also get sick less often. If you are pregnant, quitting smoking can help to lower your chances of having a baby of low birth weight. What can I do to help me quit smoking? Talk with your doctor about what can help you quit smoking. Some things you can do (strategies) include:  Quitting smoking totally, instead of slowly cutting back how much you smoke over a period of time.  Going to in-person counseling. You are more likely to quit if you go to many counseling sessions.  Using resources and support systems, such as:  Online chats with a Social worker.  Phone quitlines.  Printed Furniture conservator/restorer.  Support groups or group counseling.  Text messaging programs.  Mobile phone apps or applications.  Taking medicines. Some of these medicines may have nicotine in them. If you are pregnant or breastfeeding, do not take any medicines to quit smoking unless your doctor says it is okay. Talk with your doctor about counseling or other things that can help you. Talk with your doctor about using more than one strategy at the same time, such as taking medicines while you are also going to in-person counseling. This can help make quitting  easier. What things can I do to make it easier to quit? Quitting smoking might feel very hard at first, but there is a lot that you can do to make it easier. Take these steps:  Talk to your family and friends. Ask them to support and encourage you.  Call phone quitlines, reach out to support groups, or work with a Social worker.  Ask people who smoke to not smoke around you.  Avoid places that make you want (trigger) to smoke, such as:  Bars.  Parties.  Smoke-break areas at work.  Spend time with people who do not smoke.  Lower the stress in your life. Stress can make you want to smoke. Try these things to help your stress:  Getting regular exercise.  Deep-breathing exercises.  Yoga.  Meditating.  Doing a body scan. To do this, close your eyes, focus on one area of your body at a time from head to toe, and notice which parts of your body are tense. Try to relax the muscles in those areas.  Download or buy apps on your mobile phone or tablet that can help you stick to your quit plan. There are many free apps, such as QuitGuide from the [employer] Office manager for Disease Control and Prevention). You can find more support from smokefree.gov and other websites. This information is not intended to replace advice given to you by your health care provider. Make sure you discuss any questions you have with your health care provider. Document Released: 05/02/2009 Document Revised: 03/03/2016 Document  Reviewed: 11/20/2014 Elsevier Interactive Patient Education  2017 [place].       Influenza, Adult Influenza, more commonly known as the flu, is a viral infection that primarily affects the respiratory tract. The respiratory tract includes organs that help you breathe, such as the lungs, nose, and throat. The flu causes many common cold symptoms, as well as a high fever and body aches. The flu spreads easily from person to person (is contagious). Getting a flu shot (influenza vaccination)  every year is the best way to prevent influenza. What are the causes? Influenza is caused by a virus. You can catch the virus by:  Breathing in droplets from an infected person's cough or sneeze.  Touching something that was recently contaminated with the virus and then touching your mouth, nose, or eyes. What increases the risk? The following factors may make you more likely to get the flu:  Not cleaning your hands frequently with soap and water or alcohol-based hand sanitizer.  Having close contact with many people during cold and flu season.  Touching your mouth, eyes, or nose without washing or sanitizing your hands first.  Not drinking enough fluids or not eating a healthy diet.  Not getting enough sleep or exercise.  Being under a high amount of stress.  Not getting a yearly (annual) flu shot. You may be at a higher risk of complications from the flu, such as a severe lung infection (pneumonia), if you:  Are over the age of 14.  Are pregnant.  Have a weakened disease-fighting system (immune system). You may have a weakened immune system if you:  Have HIV or AIDS.  Are undergoing chemotherapy.  Aretaking medicines that reduce the activity of (suppress) the immune system.  Have a long-term (chronic) illness, such as heart disease, kidney disease, diabetes, or lung disease.  Have a liver disorder.  Are obese.  Have anemia. What are the signs or symptoms? Symptoms of this condition typically last 4-10 days and may include:  Fever.  Chills.  Headache, body aches, or muscle aches.  Sore throat.  Cough.  Runny or congested nose.  Chest discomfort and cough.  Poor appetite.  Weakness or tiredness (fatigue).  Dizziness.  Nausea or vomiting. How is this diagnosed? This condition may be diagnosed based on your medical history and a physical exam. Your health care provider may do a nose or throat swab test to confirm the diagnosis. How is this  treated? If influenza is detected early, you can be treated with antiviral medicine that can reduce the length of your illness and the severity of your symptoms. This medicine may be given by mouth (orally) or through an IV tube that is inserted in one of your veins. The goal of treatment is to relieve symptoms by taking care of yourself at home. This may include taking over-the-counter medicines, drinking plenty of fluids, and adding humidity to the air in your home. In some cases, influenza goes away on its own. Severe influenza or complications from influenza may be treated in a hospital. Follow these instructions at home:  Take over-the-counter and prescription medicines only as told by your health care provider.  Use a cool mist humidifier to add humidity to the air in your home. This can make breathing easier.  Rest as needed.  Drink enough fluid to keep your urine clear or pale yellow.  Cover your mouth and nose when you cough or sneeze.  Wash your hands with soap and water often, especially after you  cough or sneeze. If soap and water are not available, use hand sanitizer.  Stay home from work or school as told by your health care provider. Unless you are visiting your health care provider, try to avoid leaving home until your fever has been gone for 24 hours without the use of medicine.  Keep all follow-up visits as told by your health care provider. This is important. How is this prevented?  Getting an annual flu shot is the best way to avoid getting the flu. You may get the flu shot in late summer, fall, or winter. Ask your health care provider when you should get your flu shot.  Wash your hands often or use hand sanitizer often.  Avoid contact with people who are sick during cold and flu season.  Eat a healthy diet, drink plenty of fluids, get enough sleep, and exercise regularly. Contact a health care provider if:  You develop new symptoms.  You have:  Chest  pain.  Diarrhea.  A fever.  Your cough gets worse.  You produce more mucus.  You feel nauseous or you vomit. Get help right away if:  You develop shortness of breath or difficulty breathing.  Your skin or nails turn a bluish color.  You have severe pain or stiffness in your neck.  You develop a sudden headache or sudden pain in your face or ear.  You cannot stop vomiting. This information is not intended to replace advice given to you by your health care provider. Make sure you discuss any questions you have with your health care provider. Document Released: 07/03/2000 Document Revised: 12/12/2015 Document Reviewed: 04/30/2015 Elsevier Interactive Patient Education  2017 [employer].

## 2016-09-24 DIAGNOSIS — N179 Acute kidney failure, unspecified: Secondary | ICD-10-CM

## 2016-09-24 DIAGNOSIS — C775 Secondary and unspecified malignant neoplasm of intrapelvic lymph nodes: Secondary | ICD-10-CM | POA: Diagnosis not present

## 2016-09-24 DIAGNOSIS — C61 Malignant neoplasm of prostate: Secondary | ICD-10-CM | POA: Diagnosis not present

## 2016-09-24 HISTORY — DX: Acute kidney failure, unspecified: N17.9

## 2016-09-24 LAB — LEGIONELLA PNEUMOPHILA SEROGP 1 UR AG: L. PNEUMOPHILA SEROGP 1 UR AG: NEGATIVE

## 2016-09-27 LAB — CULTURE, BLOOD (ROUTINE X 2)
Culture: NO GROWTH
Culture: NO GROWTH

## 2016-09-28 ENCOUNTER — Encounter: Payer: Medicare Other | Admitting: Internal Medicine

## 2016-09-28 ENCOUNTER — Other Ambulatory Visit: Payer: Self-pay | Admitting: Internal Medicine

## 2016-09-30 ENCOUNTER — Ambulatory Visit (INDEPENDENT_AMBULATORY_CARE_PROVIDER_SITE_OTHER): Payer: Medicare Other | Admitting: Pharmacist

## 2016-09-30 VITALS — BP 158/86

## 2016-09-30 DIAGNOSIS — E1165 Type 2 diabetes mellitus with hyperglycemia: Secondary | ICD-10-CM | POA: Diagnosis present

## 2016-09-30 DIAGNOSIS — E118 Type 2 diabetes mellitus with unspecified complications: Secondary | ICD-10-CM

## 2016-09-30 DIAGNOSIS — F17210 Nicotine dependence, cigarettes, uncomplicated: Secondary | ICD-10-CM

## 2016-09-30 DIAGNOSIS — Z7984 Long term (current) use of oral hypoglycemic drugs: Secondary | ICD-10-CM | POA: Diagnosis not present

## 2016-09-30 DIAGNOSIS — J441 Chronic obstructive pulmonary disease with (acute) exacerbation: Secondary | ICD-10-CM

## 2016-09-30 LAB — POCT CBG (FASTING - GLUCOSE)-MANUAL ENTRY: Glucose Fasting, POC: 226 mg/dL — AB (ref 70–99)

## 2016-09-30 MED ORDER — GLUCOSE BLOOD VI STRP: ORAL_STRIP | 11 refills | Status: DC

## 2016-09-30 MED ORDER — NICOTINE POLACRILEX 2 MG MT GUM: CHEWING_GUM | OROMUCOSAL | 0 refills | Status: DC

## 2016-09-30 MED ORDER — NICOTINE 7 MG/24HR TD PT24: MEDICATED_PATCH | TRANSDERMAL | 0 refills | Status: DC

## 2016-09-30 MED ORDER — BUDESONIDE 90 MCG/ACT IN AEPB: 1.0000 | INHALATION_SPRAY | Freq: Two times a day (BID) | RESPIRATORY_TRACT | 3 refills | Status: DC

## 2016-09-30 MED ORDER — NICOTINE 14 MG/24HR TD PT24: MEDICATED_PATCH | TRANSDERMAL | 0 refills | Status: DC

## 2016-09-30 MED ORDER — ONETOUCH DELICA LANCETS FINE MISC: 1.0000 | Freq: Two times a day (BID) | 11 refills | Status: DC

## 2016-09-30 MED ORDER — ONETOUCH VERIO W/DEVICE KIT: 1.0000 | PACK | Freq: Two times a day (BID) | 0 refills | Status: DC

## 2016-09-30 MED ORDER — NICOTINE 21 MG/24HR TD PT24: MEDICATED_PATCH | TRANSDERMAL | 0 refills | Status: DC

## 2016-09-30 MED ORDER — NICOTINE POLACRILEX 2 MG MT GUM
CHEWING_GUM | OROMUCOSAL | 0 refills | Status: DC
Start: 2016-09-30 — End: 2016-09-30

## 2016-09-30 NOTE — Addendum Note (Signed)
Addended by: Forde Dandy on: 09/30/2016 02:33 PM   Modules accepted: Orders, SmartSet

## 2016-09-30 NOTE — Progress Notes (Signed)
S: Jacob Joseph is a 61 y.o. male reports to clinical pharmacist appointment for medication review and to pick up a blood glucose meter.  Patient did not bring medication bottles but did bring a medication list. Patient is unaccompanied.   No Known Allergies Prior to Admission medications   Medication Sig Start Date End Date Taking? Authorizing Provider  albuterol (PROAIR HFA) 108 (90 Base) MCG/ACT inhaler INHALE 2 PUFFS INTO THE LUNGS EVERY 6 HOURS AS NEEDED FOR WHEEZING OR SHORTNESS OF BREATH, TO NOT USE EVERY DAY, USE ONLY IF NEEDED 08/28/16  Yes Burgess Estelle, MD  aspirin 81 MG EC tablet Take 1 tablet (81 mg total) by mouth daily. 01/27/16  Yes Burgess Estelle, MD  cholecalciferol (VITAMIN D) 1000 units tablet Take 1 tablet (1,000 Units total) by mouth daily. 01/27/16  Yes Burgess Estelle, MD  citalopram (CELEXA) 20 MG tablet Take 20 mg by mouth daily. 10/25/14  Yes Historical Provider, MD  diclofenac sodium (VOLTAREN) 1 % GEL Apply 4 g topically 4 (four) times daily. Patient taking differently: Apply 4 g topically 4 (four) times daily as needed (pain).  01/27/16  Yes Burgess Estelle, MD  edoxaban (SAVAYSA) 60 MG TABS tablet Take 60 mg by mouth daily. 06/01/16  Yes Burgess Estelle, MD  gabapentin (NEURONTIN) 300 MG capsule Take 2 capsules (600 mg total) by mouth at bedtime. 07/24/16  Yes Burgess Estelle, MD  GENVOYA 150-150-200-10 MG TABS tablet TAKE 1 TABLET BY MOUTH DAILY WITH BREAKFAST(TAKE WITH PREZISTA) CALL [phone] FOR MISSED APPOINTMENT 08/24/16  Yes Michel Bickers, MD  guaiFENesin-dextromethorphan [facility] DM) 100-10 MG/5ML syrup Take 5 mLs by mouth every 4 (four) hours as needed for cough. 09/23/16  Yes Asencion Partridge, MD  hydroxypropyl methylcellulose / hypromellose (ISOPTO TEARS / GONIOVISC) 2.5 % ophthalmic solution Place 1 drop into both eyes as needed for dry eyes.   Yes Historical Provider, MD  lisinopril (PRINIVIL,ZESTRIL) 5 MG tablet Take 1 tablet (5 mg total) by mouth daily. 01/28/16  Yes Burgess Estelle, MD  metFORMIN (GLUCOPHAGE) 500 MG tablet Take 1 tablet (500 mg total) by mouth daily with breakfast. 12/02/15 12/01/16 Yes Burgess Estelle, MD  ONETOUCH DELICA LANCETS FINE MISC CHECK BLOOD SUGAR ONE TIME DAILY BEFORE BREAKFAST 09/08/16  Yes Burgess Estelle, MD  PREZISTA 800 MG tablet TAKE 1 TABLET BY MOUTH EVERY DAY WITH BREAKFAST 07/24/16  Yes Michel Bickers, MD  QUEtiapine (SEROQUEL) 300 MG tablet Take 300 mg by mouth 2 (two) times daily.  11/11/15  Yes Historical Provider, MD  rosuvastatin (CRESTOR) 10 MG tablet Take 1 tablet (10 mg total) by mouth at bedtime. 03/16/16 03/16/17 Yes Burgess Estelle, MD  tamsulosin (FLOMAX) 0.4 MG CAPS capsule Take 1 capsule (0.4 mg total) by mouth daily after breakfast. 06/19/16  Yes Burgess Estelle, MD  Tiotropium Bromide-Olodaterol (STIOLTO RESPIMAT) 2.5-2.5 MCG/ACT AERS Inhale 1 puff into the lungs 2 (two) times daily.   Yes Historical Provider, MD  valACYclovir (VALTREX) 500 MG tablet TAKE 1 TABLET BY MOUTH DAILY 12/31/15  Yes Michel Bickers, MD  Budesonide 90 MCG/ACT inhaler Inhale 1-2 puffs into the lungs 2 (two) times daily. Patient not taking: Reported on 09/30/2016 09/30/16   Oval Linsey, MD   @IMMUNIZATION @ Past Medical History:  Diagnosis Date  . Calcium oxalate crystals in urine 12/23/2011   Asymptomatic, no hematuria. Advised to take plenty of water.  [name] Kitchen COPD (chronic obstructive pulmonary disease) ([place])   . Depression   . Diabetes mellitus without complication ([place]) 4854   pre diabetic  . Hepatitis  C   . HIV (human immunodeficiency virus infection) ([place])   . Hypertension   . Peripheral arterial disease ([place])    ooccluded left SFA by duplex ultrasound, tibial disease left greater than right  . Stroke ([place]) 11/2011   Carotids Doppler negative. Right sided weakness resolved, initially on Plavix for 3 months and then continued with ASA.    . TB lung, latent 1988   Treated   Social History   Social History  . Marital status: Married    Spouse name:  N/A  . Number of children: N/A  . Years of education: N/A   Social History Main Topics  . Smoking status: Current Every Day Smoker    Packs/day: 0.50    Years: 41.00    Types: Cigarettes    Last attempt to quit: 11/18/2014  . Smokeless tobacco: Never Used  . Alcohol use No  . Drug use: Yes    Frequency: 3.0 times per week    Types: "Crack" cocaine, Cocaine, Marijuana  . Sexual activity: Not on file     Comment: declined condoms   Other Topics Concern  . Not on file   Social History Narrative   Pt lives with wife and grandkid in [place].   Has applied for disability and is pending.   Worked in [place] before about 3 years ago.         Family History  Problem Relation Age of Onset  . Hypertension Mother   . Heart attack Mother   . Stroke Mother   . Hypertension Father   . Cancer Father     O:    Component Value Date/Time   CHOL 119 08/24/2016 1348   CHOL 128 12/02/2015 1445   HDL 39 (L) 08/24/2016 1348   HDL 31 (L) 12/02/2015 1445   TRIG 142 08/24/2016 1348   AST 54 (H) 09/21/2016 2310   ALT 32 09/21/2016 2310   NA 137 09/23/2016 0252   NA 140 01/27/2016 1527   K 4.5 09/23/2016 0252   CL 106 09/23/2016 0252   CO2 18 (L) 09/23/2016 0252   GLUCOSE 138 (H) 09/23/2016 0252   HGBA1C 6.3 06/19/2016 0947   HGBA1C 5.9 (H) 03/08/2012 2106   BUN 17 09/23/2016 0252   BUN 14 01/27/2016 1527   CREATININE 1.14 09/23/2016 0252   CREATININE 1.48 (H) 08/24/2016 1348   CALCIUM 8.7 (L) 09/23/2016 0252   GFRNONAA >60 09/23/2016 0252   GFRNONAA 84 01/28/2015 1555   GFRAA >60 09/23/2016 0252   GFRAA >89 01/28/2015 1555   WBC 14.3 (H) 09/23/2016 0252   HGB 11.1 (L) 09/23/2016 0252   HCT 34.5 (L) 09/23/2016 0252   HCT 39.2 05/14/2016 1515   PLT 178 09/23/2016 0252   PLT 214 05/14/2016 1515   TSH 0.998 09/28/2013 0906   Ht Readings from Last 2 Encounters:  09/22/16 6' (1.829 m)  09/08/16 6' (1.829 m)   Wt Readings from Last 2 Encounters:  09/23/16 221 lb (100.2 kg)   09/08/16 223 lb (101.2 kg)   There is no height or weight on file to calculate BMI. BP Readings from Last 3 Encounters:  09/30/16 (!) 158/86  09/23/16 (!) 131/93  09/08/16 128/84     A/P: A drug regimen assessment was performed, including review of allergies, interactions, disease-state management, dosing and immunization history. Medications were reviewed with the patient, including name, instructions, indication, goals of therapy, potential side effects, importance of adherence, and safe use.  Findings/Recommendations:   Educated and provided  patient on blood glucose meter including how and when to check his blood glucose.   Educated patient on smoking cessation.Patient is Ready to Quit. We will send in prescriptions to his pharmacy for nicotine patches and gum.  Provided patient with new Stiolto Respimat inhaler. Educated on administration and when to use it. Told him to use up his Spiriva and other inhaler at home and then start [place]. We also got him set up to receive a nebulizer and budesonide nebulizer solution through the mail.   Patient had not taken his metformin or lisinopril this morning but will take them when he returns home . He is expecting refills on his metformin in the mail this week.   An after visit summary was provided and patient advised to follow up in 1 month or sooner if any changes in condition or questions regarding medications arise.   The patient verbalized understanding of information provided by repeating back concepts discussed.   20  minutes spent face-to-face with the patient during the encounter. 50 % of time spent on education. 50 % of time was spent on medication review and identifying barriers to care .   Ihor Austin, PharmD PGY1 Pharmacy Resident   Pager: [phone]

## 2016-09-30 NOTE — Progress Notes (Signed)
For inhaled ICS therapy, patient was switched from fluticasone to budesonide to reduce risk of adrenal suppression drug interaction with antiretrovirals. [facility] pharmacist confirmed $0 copay.

## 2016-10-01 DIAGNOSIS — F329 Major depressive disorder, single episode, unspecified: Secondary | ICD-10-CM | POA: Diagnosis not present

## 2016-10-13 ENCOUNTER — Telehealth: Payer: Self-pay | Admitting: Internal Medicine

## 2016-10-13 NOTE — Telephone Encounter (Signed)
APT. REMINDER CALL, LMTCB °

## 2016-10-14 ENCOUNTER — Ambulatory Visit: Payer: Medicare Other | Admitting: Pharmacist

## 2016-10-15 ENCOUNTER — Encounter (INDEPENDENT_AMBULATORY_CARE_PROVIDER_SITE_OTHER): Payer: Self-pay

## 2016-10-15 ENCOUNTER — Ambulatory Visit (INDEPENDENT_AMBULATORY_CARE_PROVIDER_SITE_OTHER): Payer: Medicare Other | Admitting: Pharmacist

## 2016-10-15 VITALS — BP 123/86 | HR 93

## 2016-10-15 DIAGNOSIS — Z7984 Long term (current) use of oral hypoglycemic drugs: Secondary | ICD-10-CM

## 2016-10-15 DIAGNOSIS — E119 Type 2 diabetes mellitus without complications: Secondary | ICD-10-CM

## 2016-10-15 LAB — POCT CBG (FASTING - GLUCOSE)-MANUAL ENTRY: GLUCOSE FASTING, POC: 120 mg/dL — AB (ref 70–99)

## 2016-10-15 NOTE — Progress Notes (Addendum)
S: Jacob Joseph is a 61 y.o. male reports to clinical pharmacist appointment for BP check and medication management. Patient did not bring medication bottles. Patient not accompanied by anyone and manages medications on his own  No Known Allergies Medication Sig  albuterol (PROAIR HFA) 108 (90 Base) MCG/ACT inhaler INHALE 2 PUFFS INTO THE LUNGS EVERY 6 HOURS AS NEEDED FOR WHEEZING OR SHORTNESS OF BREATH, TO NOT USE EVERY DAY, USE ONLY IF NEEDED  aspirin 81 MG EC tablet Take 1 tablet (81 mg total) by mouth daily.  Blood Glucose Monitoring Suppl (ONETOUCH VERIO) w/Device KIT 1 Device by Does not apply route 2 (two) times daily. ICD 10  E11.8, non-insulin dependent, test up to twice daily  Budesonide 90 MCG/ACT inhaler Inhale 1-2 puffs into the lungs 2 (two) times daily. Patient not taking: Reported on 09/30/2016  cholecalciferol (VITAMIN D) 1000 units tablet Take 1 tablet (1,000 Units total) by mouth daily.  citalopram (CELEXA) 20 MG tablet Take 20 mg by mouth daily.  diclofenac sodium (VOLTAREN) 1 % GEL Apply 4 g topically 4 (four) times daily. Patient taking differently: Apply 4 g topically 4 (four) times daily as needed (pain).   edoxaban (SAVAYSA) 60 MG TABS tablet Take 60 mg by mouth daily.  gabapentin (NEURONTIN) 300 MG capsule Take 2 capsules (600 mg total) by mouth at bedtime.  GENVOYA 150-150-200-10 MG TABS tablet TAKE 1 TABLET BY MOUTH DAILY WITH BREAKFAST(TAKE WITH PREZISTA) CALL [phone] FOR MISSED APPOINTMENT  glucose blood (ONETOUCH VERIO) test strip Use as instructed ICD 10  E11.8, non-insulin dependent, test up to twice daily  guaiFENesin-dextromethorphan (ROBITUSSIN DM) 100-10 MG/5ML syrup Take 5 mLs by mouth every 4 (four) hours as needed for cough.  hydroxypropyl methylcellulose / hypromellose (ISOPTO TEARS / GONIOVISC) 2.5 % ophthalmic solution Place 1 drop into both eyes as needed for dry eyes.  lisinopril (PRINIVIL,ZESTRIL) 5 MG tablet Take 1 tablet (5 mg total) by mouth  daily.  metFORMIN (GLUCOPHAGE) 500 MG tablet Take 1 tablet (500 mg total) by mouth daily with breakfast.  nicotine (NICODERM CQ - DOSED IN MG/24 HOURS) 14 mg/24hr patch RX #2 Weeks 5-6: 14 mg x 1 patch daily. Wear for 24 hours. If you have sleep disturbances, remove at bedtime.  nicotine (NICODERM CQ - DOSED IN MG/24 HOURS) 21 mg/24hr patch RX #1 Weeks 1-4: 21 mg x 1 patch daily. Wear for 24 hours. If you have sleep disturbances, remove at bedtime.  nicotine (NICODERM CQ - DOSED IN MG/24 HR) 7 mg/24hr patch RX #3 Weeks 7-8: 7 mg x 1 patch daily. Wear for 24 hours. If you have sleep disturbances, remove at bedtime.  nicotine polacrilex (NICORETTE) 2 MG gum RX #1 Weeks 1-6: 1 piece every 1-2 hours.Use at least 9 pieces of gum per day for the first 6 weeks. Max 24 pieces per day.  ONETOUCH DELICA LANCETS FINE MISC 1 Stick by Does not apply route 2 (two) times daily. ICD 10  E11.8, non-insulin dependent, test up to twice daily  PREZISTA 800 MG tablet TAKE 1 TABLET BY MOUTH EVERY DAY WITH BREAKFAST  QUEtiapine (SEROQUEL) 300 MG tablet Take 300 mg by mouth 2 (two) times daily.   rosuvastatin (CRESTOR) 10 MG tablet Take 1 tablet (10 mg total) by mouth at bedtime.  tamsulosin (FLOMAX) 0.4 MG CAPS capsule TAKE 1 CAPSULE(0.4 MG) BY MOUTH DAILY AFTER BREAKFAST  Tiotropium Bromide-Olodaterol (STIOLTO RESPIMAT) 2.5-2.5 MCG/ACT AERS Inhale 1 puff into the lungs 2 (two) times daily.  valACYclovir (VALTREX) 500 MG tablet  TAKE 1 TABLET BY MOUTH DAILY   Past Medical History:  Diagnosis Date  . Calcium oxalate crystals in urine 12/23/2011   Asymptomatic, no hematuria. Advised to take plenty of water.  [name] Kitchen COPD (chronic obstructive pulmonary disease) ([place])   . Depression   . Diabetes mellitus without complication ([place]) 8756   pre diabetic  . Hepatitis C   . HIV (human immunodeficiency virus infection) ([place])   . Hypertension   . Peripheral arterial disease ([place])    ooccluded left SFA by duplex ultrasound, tibial  disease left greater than right  . Stroke ([place]) 11/2011   Carotids Doppler negative. Right sided weakness resolved, initially on Plavix for 3 months and then continued with ASA.    . TB lung, latent 1988   Treated   Social History   Social History  . Marital status: Married    Spouse name: N/A  . Number of children: N/A  . Years of education: N/A   Social History Main Topics  . Smoking status: Current Every Day Smoker    Packs/day: 0.50    Years: 41.00    Types: Cigarettes    Last attempt to quit: 11/18/2014  . Smokeless tobacco: Never Used  . Alcohol use No  . Drug use: Yes    Frequency: 3.0 times per week    Types: "Crack" cocaine, Cocaine, Marijuana  . Sexual activity: Not on file     Comment: declined condoms   Other Topics Concern  . Not on file   Social History Narrative   Pt lives with wife and grandkid in [place].   Has applied for disability and is pending.   Worked in [place] before about 3 years ago.         Family History  Problem Relation Age of Onset  . Hypertension Mother   . Heart attack Mother   . Stroke Mother   . Hypertension Father   . Cancer Father    O:    Component Value Date/Time   CHOL 119 08/24/2016 1348   CHOL 128 12/02/2015 1445   HDL 39 (L) 08/24/2016 1348   HDL 31 (L) 12/02/2015 1445   TRIG 142 08/24/2016 1348   AST 54 (H) 09/21/2016 2310   ALT 32 09/21/2016 2310   NA 137 09/23/2016 0252   NA 140 01/27/2016 1527   K 4.5 09/23/2016 0252   CL 106 09/23/2016 0252   CO2 18 (L) 09/23/2016 0252   GLUCOSE 138 (H) 09/23/2016 0252   HGBA1C 6.3 06/19/2016 0947   HGBA1C 5.9 (H) 03/08/2012 2106   BUN 17 09/23/2016 0252   BUN 14 01/27/2016 1527   CREATININE 1.14 09/23/2016 0252   CREATININE 1.48 (H) 08/24/2016 1348   CALCIUM 8.7 (L) 09/23/2016 0252   GFRAA >60 09/23/2016 0252   GFRAA >89 01/28/2015 1555   WBC 14.3 (H) 09/23/2016 0252   HGB 11.1 (L) 09/23/2016 0252   HCT 34.5 (L) 09/23/2016 0252   HCT 39.2 05/14/2016 1515    PLT 178 09/23/2016 0252   PLT 214 05/14/2016 1515   TSH 0.998 09/28/2013 0906   Ht Readings from Last 2 Encounters:  09/22/16 6' (1.829 m)  09/08/16 6' (1.829 m)   Wt Readings from Last 2 Encounters:  09/23/16 221 lb (100.2 kg)  09/08/16 223 lb (101.2 kg)   There is no height or weight on file to calculate BMI. BP Readings from Last 3 Encounters:  10/15/16 123/86  09/30/16 (!) 158/86  09/23/16 (!) 131/93   A/P:  A drug regimen assessment was performed, including review of allergies, interactions, disease-state management, dosing and immunization history. Medications were reviewed with the patient, including name, instructions, indication, goals of therapy, potential side effects, importance of adherence, and safe use.  Findings/Recommendations:  -Patient was almost out of Stiolto, so we provided him with a sample.       Stiolto Respimat 2.5/2.5 - Take 2 puffs twice a day.         Lot: [id number]       Expiration: 07/2018 -Patient has not picked up nicotine patches or gum or lancets/blood glucose strips -New prescriptions were sent over to the [facility] per his request -BP and BG were controlled at 123/86 and 120 respectively   The patient verbalized understanding of information provided by repeating back concepts discussed.   15 minutes spent face-to-face with the patient during the encounter. 50% of time spent on education. 50% of time was spent on BP and BG monitoring.   I have reviewed Adetokunbo Mccadden's note and agree with her plan and documentation.   Ihor Austin, PharmD PGY1 Pharmacy Resident Pager: [phone]

## 2016-10-28 ENCOUNTER — Ambulatory Visit (INDEPENDENT_AMBULATORY_CARE_PROVIDER_SITE_OTHER): Payer: Medicare Other | Admitting: Podiatry

## 2016-10-28 DIAGNOSIS — B20 Human immunodeficiency virus [HIV] disease: Secondary | ICD-10-CM | POA: Diagnosis not present

## 2016-10-28 DIAGNOSIS — B351 Tinea unguium: Secondary | ICD-10-CM

## 2016-10-28 DIAGNOSIS — M79676 Pain in unspecified toe(s): Secondary | ICD-10-CM

## 2016-10-28 DIAGNOSIS — E1142 Type 2 diabetes mellitus with diabetic polyneuropathy: Secondary | ICD-10-CM

## 2016-10-28 DIAGNOSIS — R0989 Other specified symptoms and signs involving the circulatory and respiratory systems: Secondary | ICD-10-CM

## 2016-10-28 NOTE — Patient Instructions (Signed)

## 2016-10-28 NOTE — Progress Notes (Signed)
Patient ID: Jacob Joseph, male   DOB: Nov 08, 1955, 61 y.o.   MRN: [id number]    Subjective: This patient presents today complaining of thickened and elongated toenails which are uncomfortable when walking wearing shoes and requests toenail debridement. He was last seen for this visit on 11/26/2014 and at that time advised to return at three-month intervals for debridement of the mycotic toenails. He does not present until today for follow-up care. He denies any open lesions, claudication since the previous visit Patient has history of previous use of terbinafine for toenail fungus Patient is HIV-positive and a diabetic      Patient appears orientated 3  Vascular: DP 0/4 bilaterally PT right 2/4 PT left 0/4 Capillary reflex immediate bilaterally  Neurological: Sensation to 10 g monofilament wire intact 3/5 bilaterally Vibratory sensation reactive bilaterally Ankle reflex reactive bilaterally  Dermatological: No open skin lesions bilaterally Dry moccasin scaling skin medially plantarly and laterally bilaterally The toenails are extremely elongated, hypertrophic, brittle, discolored and tender to back palpation 6-10  Musculoskeletal: HAV deformities bilaterally  Assessment & Plan:   Assessment: Decrease pedal pulses suggestive peripheral arterial disease Diabetic peripheral neuropathy symptomatic onychomycoses 6-10 Tinea pedis bilaterally HIV-positive  Plan:  Nails 10 are debrided mechanically and electrically without any bleeding  Reappoint 3 months

## 2016-11-02 ENCOUNTER — Ambulatory Visit (INDEPENDENT_AMBULATORY_CARE_PROVIDER_SITE_OTHER): Payer: Medicare Other | Admitting: Internal Medicine

## 2016-11-02 VITALS — BP 131/82 | HR 116 | Temp 97.7°F | Ht 72.0 in | Wt 223.3 lb

## 2016-11-02 DIAGNOSIS — E118 Type 2 diabetes mellitus with unspecified complications: Secondary | ICD-10-CM

## 2016-11-02 DIAGNOSIS — Z79899 Other long term (current) drug therapy: Secondary | ICD-10-CM | POA: Diagnosis not present

## 2016-11-02 DIAGNOSIS — J449 Chronic obstructive pulmonary disease, unspecified: Secondary | ICD-10-CM | POA: Diagnosis not present

## 2016-11-02 DIAGNOSIS — N401 Enlarged prostate with lower urinary tract symptoms: Secondary | ICD-10-CM | POA: Diagnosis not present

## 2016-11-02 DIAGNOSIS — E559 Vitamin D deficiency, unspecified: Secondary | ICD-10-CM | POA: Diagnosis not present

## 2016-11-02 DIAGNOSIS — E119 Type 2 diabetes mellitus without complications: Secondary | ICD-10-CM | POA: Diagnosis not present

## 2016-11-02 DIAGNOSIS — Z8709 Personal history of other diseases of the respiratory system: Secondary | ICD-10-CM

## 2016-11-02 DIAGNOSIS — Z7984 Long term (current) use of oral hypoglycemic drugs: Secondary | ICD-10-CM

## 2016-11-02 DIAGNOSIS — I10 Essential (primary) hypertension: Secondary | ICD-10-CM | POA: Diagnosis not present

## 2016-11-02 DIAGNOSIS — R351 Nocturia: Secondary | ICD-10-CM | POA: Diagnosis not present

## 2016-11-02 DIAGNOSIS — R296 Repeated falls: Secondary | ICD-10-CM | POA: Diagnosis not present

## 2016-11-02 DIAGNOSIS — Z299 Encounter for prophylactic measures, unspecified: Secondary | ICD-10-CM

## 2016-11-02 DIAGNOSIS — F17210 Nicotine dependence, cigarettes, uncomplicated: Secondary | ICD-10-CM

## 2016-11-02 DIAGNOSIS — Z9181 History of falling: Secondary | ICD-10-CM

## 2016-11-02 DIAGNOSIS — F1910 Other psychoactive substance abuse, uncomplicated: Secondary | ICD-10-CM

## 2016-11-02 LAB — GLUCOSE, CAPILLARY: Glucose-Capillary: 96 mg/dL (ref 65–99)

## 2016-11-02 LAB — POCT GLYCOSYLATED HEMOGLOBIN (HGB A1C): Hemoglobin A1C: 6.2

## 2016-11-02 NOTE — Assessment & Plan Note (Signed)
Deferred shingrix today.

## 2016-11-02 NOTE — Assessment & Plan Note (Signed)
BP Readings from Last 3 Encounters:  11/02/16 131/82  10/15/16 123/86  09/30/16 (!) 158/86    Patient is compliant with lisinopril and BP is at goal being 130/80.  Plan -continue lisinopril 5 mg daily -follow up in 3 months

## 2016-11-02 NOTE — Patient Instructions (Signed)
Thank you for your visit today  I talked with our pharmacist, and we will reach out to your pharmacy and send prescription for nicotine inhaler, and possibly wellbutrin.   We will also clarify your inhalers for your COPD. Please stop smoking.  Please continue your lisinopril and metformin. You do not need to check your blood sugars.   Please go to physical therapy to assess your strength as you have fallen several times.   Please continue to follow up with the urologist  Please follow up in 3 months

## 2016-11-02 NOTE — Assessment & Plan Note (Signed)
Pt currently smokes 0.5 pack per day and has a >30 pack year smoking history.  He did not get the nicotine patches. He does not desire to reduce or quit currently but is interested in having pharmacological measures.  Plan -we will attempt to get the nicotrol inhaler sent to the pharmacy

## 2016-11-02 NOTE — Assessment & Plan Note (Addendum)
Pt says that since the hospital discharge, he has fallen multiple times. He denies any LOC or dizziness. Denies any palpitations. He says that he would be walking and would feel like his legs would give out . Likely these are mechanical falls.  He ambulated well today without being hypoxic.   Plan -Physical therapy evaluation. May benefit from home health PT. - Pt requested a wheelchair, will need the evaluation first.

## 2016-11-02 NOTE — Assessment & Plan Note (Signed)
Patient was recently hospitalized for COPD exacerbation triggered by influenza A.  He was recently switched to stiolto and also is on budesonide. However, he continues to take Spiriva even though it was stopped  I discussed with Dr Maudie Mercury, and we will clarify with the pharmacy the correct regimen for him.   Pt also said he continued to be dyspneic- We ambulated him with pulse ox, and his sats remained at 95% throughout the walk.   Plan Continue stiolto and budesonide  -follow up in 3 months  -encouraged smoking cessation

## 2016-11-02 NOTE — Progress Notes (Signed)
   CC: COPD, DM, HTN, substance abuse, BPH, history of PE, vit D deficiency, recurrent falls, health maintenance HPI: [name] is a 62 y.o. man with PMH noted below here for COPD, DM, HTN, substance abuse, BPH, history of PE, vit D deficiency, recurrent falls, health maintenance  Please see Problem List/A&P for the status of the patient's chronic medical problems   Past Medical History:  Diagnosis Date  . Calcium oxalate crystals in urine 12/23/2011   Asymptomatic, no hematuria. Advised to take plenty of water.  [name] Kitchen COPD (chronic obstructive pulmonary disease) ([place])   . Depression   . Diabetes mellitus without complication ([place]) 3557   pre diabetic  . Hepatitis C   . HIV (human immunodeficiency virus infection) ([place])   . Hypertension   . Peripheral arterial disease ([place])    ooccluded left SFA by duplex ultrasound, tibial disease left greater than right  . Stroke ([place]) 11/2011   Carotids Doppler negative. Right sided weakness resolved, initially on Plavix for 3 months and then continued with ASA.    . TB lung, latent 1988   Treated    Review of Systems: Denies fever, chills, wt loss.  No headaches Has chronic SOB No n/v/d Has some back pain.  Recurrent falls  Physical Exam: Vitals:   11/02/16 1543  BP: 131/82  Pulse: (!) 116  Temp: 97.7 F (36.5 C)  TempSrc: Oral  SpO2: 100%  Weight: 223 lb 4.8 oz (101.3 kg)  Height: 6' (1.829 m)    General: A&O, in NAD, in wheelchair Neck: supple, midline trachea,  CV: RRR, normal s1, s2, no m/r/g, Resp: equal and symmetric breath sounds, faint bibasilar wheezing  Abdomen: soft, nontender, nondistended, +BS Skin: warm, dry, intact,  Extremities: pulses intact b/l, no edema,    Assessment & Plan:   See encounters tab for problem based medical decision making. Patient discussed with Dr. Evette Doffing

## 2016-11-02 NOTE — Assessment & Plan Note (Signed)
Pt had follow up with Urology, and had prostate biopsy. Per report, he was advised to continue tamsulosin as his urinary symptoms are well controlled on it.  Plan -continue tamsulosin

## 2016-11-02 NOTE — Assessment & Plan Note (Signed)
Pt is compliant with daily Vitamin D 1000 IU.  Plan -continue as above

## 2016-11-02 NOTE — Assessment & Plan Note (Signed)
Lab Results  Component Value Date   HGBA1C 6.2 11/02/2016   HGBA1C 6.3 06/19/2016   HGBA1C 6.4 03/16/2016    [name] A1c is at goal on metformin 500 mg daily  Plan -continue metformin -advised that he does not need to check CBGs -crestor and aspirin -uptodate on eye and foot exam -RTC in 3 months

## 2016-11-03 NOTE — Progress Notes (Signed)
Internal Medicine Clinic Attending  Case discussed with Dr. Saraiya at the time of the visit.  We reviewed the resident's history and exam and pertinent patient test results.  I agree with the assessment, diagnosis, and plan of care documented in the resident's note.  

## 2016-11-09 ENCOUNTER — Telehealth: Payer: Self-pay | Admitting: Pharmacist

## 2016-11-09 DIAGNOSIS — Z299 Encounter for prophylactic measures, unspecified: Secondary | ICD-10-CM

## 2016-11-09 MED ORDER — ZOSTER VAC RECOMB ADJUVANTED 50 MCG/0.5ML IM SUSR: 0.5000 mL | Freq: Once | INTRAMUSCULAR | 1 refills | Status: AC

## 2016-11-09 NOTE — Telephone Encounter (Signed)
Thank you :)

## 2016-11-18 ENCOUNTER — Encounter: Payer: Self-pay | Admitting: *Deleted

## 2016-11-20 ENCOUNTER — Other Ambulatory Visit: Payer: Self-pay | Admitting: Internal Medicine

## 2016-11-23 NOTE — Telephone Encounter (Signed)
Needs to speak with a nurse about med. Please call pt back.  

## 2016-11-27 ENCOUNTER — Ambulatory Visit: Payer: Medicare Other | Attending: Internal Medicine | Admitting: Rehabilitation

## 2016-11-27 ENCOUNTER — Encounter: Payer: Self-pay | Admitting: Rehabilitation

## 2016-11-27 DIAGNOSIS — R2689 Other abnormalities of gait and mobility: Secondary | ICD-10-CM

## 2016-11-27 DIAGNOSIS — R296 Repeated falls: Secondary | ICD-10-CM

## 2016-11-27 DIAGNOSIS — M6281 Muscle weakness (generalized): Secondary | ICD-10-CM | POA: Diagnosis not present

## 2016-11-27 DIAGNOSIS — R2681 Unsteadiness on feet: Secondary | ICD-10-CM | POA: Diagnosis not present

## 2016-11-27 NOTE — Therapy (Addendum)
[facility] [address] [place], [place], [address] Phone: [phone]   Fax:  [phone]  Physical Therapy Evaluation  Patient Details  Name: Jacob Joseph MRN: [id number] Date of Birth: 1955-07-30 Referring Provider: Burgess Estelle MD   Encounter Date: 11/27/2016      PT End of Session - 11/27/16 1334    Visit Number 1   Number of Visits 17   Date for PT Re-Evaluation 01/22/17   Authorization Type Medicare & G-codes every 10th visit    PT Start Time 1145   PT Stop Time 1230   PT Time Calculation (min) 45 min   Equipment Utilized During Treatment Gait belt   Activity Tolerance Patient tolerated treatment well   Behavior During Therapy [facility] for tasks assessed/performed      Past Medical History:  Diagnosis Date  . Calcium oxalate crystals in urine 12/23/2011   Asymptomatic, no hematuria. Advised to take plenty of water.  [name] Kitchen COPD (chronic obstructive pulmonary disease) ([place])   . Depression   . Diabetes mellitus without complication ([place]) 0258   pre diabetic  . Hepatitis C   . HIV (human immunodeficiency virus infection) ([place])   . Hypertension   . Peripheral arterial disease ([place])    ooccluded left SFA by duplex ultrasound, tibial disease left greater than right  . Stroke ([place]) 11/2011   Carotids Doppler negative. Right sided weakness resolved, initially on Plavix for 3 months and then continued with ASA.    . TB lung, latent 1988   Treated    Past Surgical History:  Procedure Laterality Date  . HERNIA REPAIR    . INGUINAL HERNIA REPAIR  01/16/2012   Procedure: HERNIA REPAIR INGUINAL INCARCERATED;  Surgeon: Zenovia Jarred, MD;  Location: [facility];  Service: General;  Laterality: Right;  . TEE WITHOUT CARDIOVERSION  12/07/2011   Procedure: TRANSESOPHAGEAL ECHOCARDIOGRAM (TEE);  Surgeon: Birdie Riddle, MD;  Location: [facility];  Service: Cardiovascular;  Laterality: N/A;    There were no vitals filed for  this visit.       Subjective Assessment - 11/27/16 1151    Subjective Patient is a 61 year old male presenting to OPPT neuro s/p a COPD exacerbation due to influenza A resulting in hospitalization (09/21/2016). Patient reports his MD sent him to PT due to back pain and imbalance causing recurrent falls. The patient would like to return to walking routinely and exercising.    Pertinent History HIV, COPD, prostate cancer, HTN, diabetes, history of PE, cocaine and tobacco abuse, recurrent falls   Limitations Lifting;Standing;Walking;House hold activities   Patient Stated Goals reduce back pain; reduce falls    Currently in Pain? Yes   Pain Score 8   as low as 2/10   Pain Location Back   Pain Orientation Mid;Lower   Pain Descriptors / Indicators Aching   Pain Type Chronic pain   Pain Onset More than a month ago   Pain Frequency Constant   Aggravating Factors  walking; standing    Pain Relieving Factors lying down             [facility] PT Assessment - 11/27/16 1145      Assessment   Medical Diagnosis COPD    Referring Provider Burgess Estelle MD    Onset Date/Surgical Date 11/02/16  PT referral    Hand Dominance Right     Precautions   Precautions Fall     Balance Screen   Has the patient fallen in the  past 6 months Yes   How many times? 15  unable to report a common/recurrent situation(s) to falls   Has the patient had a decrease in activity level because of a fear of falling?  Yes   Is the patient reluctant to leave their home because of a fear of falling?  No  "I'm just careful"      [place] residence   Living Arrangements Spouse/significant other   Type of [place] to enter   Entrance Stairs-Number of Steps 3   Entrance Stairs-Rails Can reach both   [place] One level   [place] - single point;Walker - 2 wheels;Shower seat;Grab bars - tub/shower;Bedside commode     Prior Function   Level of  Independence Independent with community mobility with device;Independent with household mobility with device  using cane for approximately 5 years    Vocation On disability   Leisure walking, exercise, and going out to eat      Sensation   Light Touch Appears Intact   Light Touch Impaired Details --   Additional Comments PT assessed gross light touch sensation, and did not note any deficits. Patient reported intermittent tingling/numbness on L anterior tibia.      Posture/Postural Control   Posture/Postural Control Postural limitations   Postural Limitations Rounded Shoulders;Forward head     ROM / Strength   AROM / PROM / Strength Strength     Strength   Strength Assessment Site Hip;Knee;Ankle   Right/Left Hip Right;Left   Right Hip Flexion 4-/5   Left Hip Flexion 4/5   Right/Left Knee Right;Left   Right Knee Flexion 3-/5   Right Knee Extension 3/5   Left Knee Flexion 4/5   Left Knee Extension 3-/5   Right/Left Ankle Right;Left   Right Ankle Dorsiflexion 3+/5   Left Ankle Dorsiflexion 4/5     Transfers   Transfers Sit to Stand;Stand to Sit   Sit to Stand 5: Supervision;With upper extremity assist;With armrests;From chair/3-in-1   Sit to Stand Details Verbal cues for precautions/safety   Sit to Stand Details (indicate cue type and reason) relies on UE support on armrest    Stand to Sit 5: Supervision   Stand to Sit Details (indicate cue type and reason) Verbal cues for precautions/safety   Stand to Sit Details requires cueing to back up to chair with BLEs and reach for armrest     Ambulation/Gait   Ambulation/Gait Yes   Ambulation/Gait Assistance 5: Supervision   Ambulation Distance (Feet) 115 Feet   Assistive device Straight cane   Gait Pattern Step-to pattern;Decreased arm swing - left;Decreased step length - left;Decreased stance time - right;Decreased stride length;Decreased dorsiflexion - right;Decreased dorsiflexion - left;Decreased weight shift to right;Poor foot  clearance - left;Poor foot clearance - right   Ambulation Surface Level;Indoor   Gait velocity 1.68ft/s   Stairs Yes   Stairs Assistance 4: Min guard   Stair Management Technique Two rails;Forwards;Alternating pattern   Number of Stairs 4     Standardized Balance Assessment   Standardized Balance Assessment Dynamic Gait Index     Dynamic Gait Index   Level Surface Mild Impairment   Change in Gait Speed Moderate Impairment   Gait with Horizontal Head Turns Mild Impairment   Gait with Vertical Head Turns Mild Impairment   Gait and Pivot Turn Mild Impairment   Step Over Obstacle Mild Impairment   Step Around Obstacles Mild Impairment  Steps Mild Impairment   Total Score 15   DGI comment: Scores of 19 or less are predictive of falls in older community living adults       Vital Signs:  Prior to beginning DGI: SpO2 = 99%; HR=77bpm During seated rest break during DGI: SpO2 = 96%; HR = 85bpm End of DGI during seated rest break: SpO2=94%; HR=80bpm                      PT Education - 11/27/16 1333    Education provided Yes   Education Details plan of care, risk of falling    Person(s) Educated Patient   Methods Explanation   Comprehension Verbalized understanding          PT Short Term Goals - 11/27/16 1347      PT SHORT TERM GOAL #1   Title Patient will verbalize understanding and return demonstration for initial HEP for LE strengthening and flexibility. (TARGET DATE: 12/25/2016)    Time 4   Period Weeks   Status New     PT SHORT TERM GOAL #2   Title PT will perform 5 time sit to stand test, and short term goal will be set. (TARGET DATE: 12/25/2016)   Time 4   Period Weeks   Status New     PT SHORT TERM GOAL #3   Title PT will perform 6 minute walk test, and short term goal will be set for 75 feet longer than distance achieved at baseline. (TARGET DATE: 12/25/2016)    Time 4   Period Weeks   Status New     PT SHORT TERM GOAL #4   Title Patient's gait  speed will be > or = 2.90ft/s to indicate a significant change in his gait velocity relative to baseline and a decrease in his risk of falling. (TARGET DATE: 12/25/2016)    Time 4   Period Weeks   Status New     PT SHORT TERM GOAL #5   Title Patient will demonstrate ability to ambulate 200 feet with LRAD and modified independence without a seated rest break on level, indoor surfaces to indicate an increase in his endurance and decrease in his risk of falling. (TARGET DATE: 12/25/2016)    Time 4   Period Weeks   Status New     Additional Short Term Goals   Additional Short Term Goals Yes     PT SHORT TERM GOAL #6   Title Patient will score >/=17/24 on DGI to indicate a decrease in his risk of falling. (TARGET DATE: 12/25/2016)   Time 4   Period Weeks   Status New           PT Long Term Goals - 11/27/16 1342      PT LONG TERM GOAL #1   Title Patient will verbalize understanding and return demonstration for ongoing HEP for LE strengthening and flexibility. (TARGET DATE: 01/22/2017)    Time 8   Period Weeks   Status New     PT LONG TERM GOAL #2   Title Patient will perform 5 time sit to stand test in < or = 15s to indicate an increase in functional LE strength. (TARGET DATE: 01/22/2017)    Time 8   Period Weeks   Status New     PT LONG TERM GOAL #3   Title PT will perform 6 Minute Walk Test, and long term goal will be set for 150 feet longer than the patient achieves at  baseline. (TARGET DATE: 01/22/2017)    Time 8   Period Weeks   Status New     PT LONG TERM GOAL #4   Title Patient will demonstrate ability to ambulate 400 feet with LRAD and supervision on indoor/outdoor surfaces including ramps/curbs/stairs (1 rail) to demonstrate a decrease in his risk of falling when returning to community ambulation. 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 (TARGET DATE: 01/22/2017)    Time 8   Period Weeks   Status New     Additional Long Term Goals   Additional Long Term Goals Yes     PT LONG TERM GOAL #6   Title Patient will improve DGI score to >19/24 to indicate a decrease in his risk of falling. (TARGET DATE: 01/22/2017)   Time 8   Period Weeks   Status New               Plan - 2016-12-11 1341    Clinical Impression Statement Patient is a 61 year old male presenting to [facility] neuro for a moderate complexity PT evaluation due to a recent exacerbation of COPD resulting in hospitalization (09/21/2016), low back pain, and recurrent falls. Patient's past medical history is significant for the following: HIV, COPD, prostate cancer, HTN, diabetes, history of PE, cocaine and tobacco abuse, recurrent falls. The following deficits were noted during the patient's exam: decreased endurance, SOB with low level exertion, and decreased strength in BLEs. Patient required a seated rest break due to fatigue and mild shortness of breath after ambulating approximately 75 feet. PT monitored patient's SpO2, and it remained >/=94% throughout evaluation. The patient's gait velocity of 1.23ft/s and DGI score of 15/24 both indicate that he is at a higher risk for repeated falls. Patient would benefit from skilled PT to address these impairments and functional limitations to maximize functional mobility independence and reduce falls risk.   Rehab Potential Good   PT Frequency 2x / week   PT Duration 8 weeks   PT Treatment/Interventions ADLs/Self Care Home Management;Neuromuscular re-education;Balance training;Therapeutic exercise;Therapeutic activities;Functional mobility training;Stair training;Gait training;DME Instruction;Patient/family education;Manual techniques   PT Next Visit Plan initiate HEP; perform 6 Minute Walk Test and 5 time sit to stand    Consulted and Agree with Plan of Care Patient      Patient will benefit from skilled therapeutic  intervention in order to improve the following deficits and impairments:  Abnormal gait, Decreased activity tolerance, Decreased balance, Decreased coordination, Decreased safety awareness, Decreased range of motion, Decreased mobility, Decreased knowledge of use of DME, Decreased knowledge of precautions, Decreased endurance, Decreased strength, Difficulty walking, Postural dysfunction, Pain (PT plans to address patient's reports of back pain via functional mobility interventions )  Visit Diagnosis: Other abnormalities of gait and mobility  Repeated falls  Unsteadiness on feet  Muscle weakness (generalized)       G-Codes - 12/11/2016 1455    Functional Assessment Tool Used (Outpatient Only) DGI score = 15/24    Functional Limitation Mobility: Walking and moving around   Mobility: Walking and Moving Around Current Status (Y3016) At least 20 percent but less than 40 percent impaired, limited or restricted   Mobility: Walking and Moving Around Goal Status (W1093) At least 1 percent but less than 20 percent impaired, limited or restricted  Problem List Patient Active Problem List   Diagnosis Date Noted  . Recurrent falls 11/02/2016  . AKI (acute kidney injury) ([place]) 09/24/2016  . History of pulmonary embolus (PE) 09/22/2016  . History of prostate cancer 09/22/2016  . BPH associated with nocturia 06/19/2016  . PE (pulmonary thromboembolism) ([place]) 05/12/2016  . Substance abuse 01/27/2016  . Vitamin D deficiency 12/05/2015  . Hyperlipidemia 01/28/2015  . Cataracts, bilateral 08/29/2014  . Diabetes mellitus type 2 with complications ([place]) 33/61/2244  . COPD, moderate ([place]) 05/31/2013  . Bilateral lower extremity pain 05/08/2013  . Complex Lt Renal Cyst 03/22/2013  . Preventive measure 05/30/2012  . Normocytic anemia 03/08/2012  . CKD Stage 2 03/08/2012  . ERECTILE DYSFUNCTION 04/25/2008  . Human immunodeficiency virus (HIV) disease ([place]) 04/27/2006  . Smoking greater than 30  pack years 04/27/2006  . Major depressive disorder, recurrent episode ([place]) 04/27/2006  . Essential hypertension 04/27/2006  . DEGENERATIVE JOINT DISEASE 04/27/2006  . Chronic Low Back Pain 04/27/2006    Arelia Sneddon, SPT  11/27/2016, 5:04 PM  [facility] [address] [place], [place], [address] Phone: [phone]   Fax:  [phone]  Name: Jacob Joseph MRN: [id number] Date of Birth: [birth year]

## 2016-12-04 ENCOUNTER — Encounter: Payer: Self-pay | Admitting: Rehabilitation

## 2016-12-04 ENCOUNTER — Ambulatory Visit: Payer: Medicare Other | Admitting: Rehabilitation

## 2016-12-04 ENCOUNTER — Telehealth: Payer: Self-pay | Admitting: Pharmacist

## 2016-12-04 DIAGNOSIS — R2681 Unsteadiness on feet: Secondary | ICD-10-CM

## 2016-12-04 DIAGNOSIS — M6281 Muscle weakness (generalized): Secondary | ICD-10-CM | POA: Diagnosis not present

## 2016-12-04 DIAGNOSIS — R296 Repeated falls: Secondary | ICD-10-CM | POA: Diagnosis not present

## 2016-12-04 DIAGNOSIS — R2689 Other abnormalities of gait and mobility: Secondary | ICD-10-CM

## 2016-12-04 DIAGNOSIS — J449 Chronic obstructive pulmonary disease, unspecified: Secondary | ICD-10-CM

## 2016-12-04 NOTE — Patient Instructions (Addendum)
Functional Quadriceps: Sit to Stand    Sit on edge of chair, feet flat on floor. Stand upright, extending knees fully.  Start by using one hand if you need to.  Work your way to using no hands.  Repeat __10__ times per set. Do __1__ sets per session. Do __1-2__ sessions per day.  http://orth.exer.us/734   Copyright  VHI. All rights reserved.    WALKING  Walking is a great form of exercise to increase your strength, endurance and overall fitness.  A walking program can help you start slowly and gradually build endurance as you go.  Everyone's ability is different, so each person's starting point will be different.  You do not have to follow them exactly.  The are just samples. You should simply find out what's right for you and stick to that program.   In the beginning, you'll start off walking 2-3 times a day for short distances.  As you get stronger, you'll be walking further at just 1-2 times per day.  A. You Can Walk For A Certain Length Of Time Each Day    Walk 4 minutes 2-3 times per day.  Increase 1 minutes every 7 days (3 times per day).  Work up to 25-30 minutes (1-2 times per day).   Example:   Day 1-2 4 minutes 3 times per day   Day 7-8 5 minutes 2-3 times per day   Day 13-14 6 minutes 1-2 times per day  B. You Can Walk For a Certain Distance Each Day     Distance can be substituted for time.    Example:   3 trips to mailbox (at road)   3 trips to corner of block   3 trips around the block   Please only do the exercises that your therapist has initialed and dated  ABDUCTION: Standing (Active)    Stand, feet flat. Lift right leg out to side. Stand tall.  Face a counter top so that you have some support.   Complete __1_ sets of _10__ repetitions. Perform __1-2_ sessions per day.  http://gtsc.exer.us/110   Copyright  VHI. All rights reserved.   Mini Squat: Double Leg    With feet shoulder width apart, reach forward for balance and do a mini squat. Keep  knees in line with second toe. Knees do not go past toes.  Stand facing counter top.   Repeat _10__ times per set. Rest _60-90__ seconds after set. Do _1__ sets per session.  http://plyo.exer.us/70   Copyright  VHI. All rights reserved.

## 2016-12-04 NOTE — Progress Notes (Signed)
Contacted patient for follow up medication help. Patient states he was able to pick up the NRT patches but not gum, notified patient [facility] pharmacist stated gum would be covered under his insurance. Patient says he will try to pick up the gum. He has been able to cut back to 2-3 cigarettes per day, congratulated patient on this and updated his chart. He reports no shortness of breath or other symptoms of concern. He was advised to contact clinic if any concerns arise. PCP follow up due in July 2018. Patient verbalized understanding by repeat back.

## 2016-12-04 NOTE — Therapy (Signed)
[facility] [address] [place], [place], [address] Phone: [phone]   Fax:  [phone]  Physical Therapy Treatment  Patient Details  Name: Jacob Joseph MRN: [id number] Date of Birth: 22-May-1956 Referring Provider: Burgess Estelle MD   Encounter Date: 12/04/2016      PT End of Session - 12/04/16 1318    Visit Number 2   Number of Visits 17   Date for PT Re-Evaluation 01/22/17   Authorization Type Medicare & G-codes every 10th visit    PT Start Time 1100   PT Stop Time 1143   PT Time Calculation (min) 43 min   Activity Tolerance Patient tolerated treatment well   Behavior During Therapy [facility] for tasks assessed/performed      Past Medical History:  Diagnosis Date  . Calcium oxalate crystals in urine 12/23/2011   Asymptomatic, no hematuria. Advised to take plenty of water.  [name] Kitchen COPD (chronic obstructive pulmonary disease) ([place])   . Depression   . Diabetes mellitus without complication ([place]) 9379   pre diabetic  . Hepatitis C   . HIV (human immunodeficiency virus infection) ([place])   . Hypertension   . Peripheral arterial disease ([place])    ooccluded left SFA by duplex ultrasound, tibial disease left greater than right  . Stroke ([place]) 11/2011   Carotids Doppler negative. Right sided weakness resolved, initially on Plavix for 3 months and then continued with ASA.    . TB lung, latent 1988   Treated    Past Surgical History:  Procedure Laterality Date  . HERNIA REPAIR    . INGUINAL HERNIA REPAIR  01/16/2012   Procedure: HERNIA REPAIR INGUINAL INCARCERATED;  Surgeon: Zenovia Jarred, MD;  Location: [place];  Service: General;  Laterality: Right;  . TEE WITHOUT CARDIOVERSION  12/07/2011   Procedure: TRANSESOPHAGEAL ECHOCARDIOGRAM (TEE);  Surgeon: Birdie Riddle, MD;  Location: [facility];  Service: Cardiovascular;  Laterality: N/A;    There were no vitals filed for this visit.      Subjective Assessment -  12/04/16 1103    Subjective Pt reports no changes since last visit, back doesn't feel "too bad today."    Pertinent History HIV, COPD, prostate cancer, HTN, diabetes, history of PE, cocaine and tobacco abuse, recurrent falls   Limitations Lifting;Standing;Walking;House hold activities   Patient Stated Goals reduce back pain; reduce falls    Currently in Pain? Yes   Pain Score 2    Pain Location Back   Pain Orientation Mid;Lower   Pain Descriptors / Indicators Aching   Pain Type Chronic pain   Pain Onset More than a month ago   Pain Frequency Constant   Aggravating Factors  walking, standing   Pain Relieving Factors lying down             [facility] PT Assessment - 12/04/16 0001      6 Minute Walk- Baseline   6 Minute Walk- Baseline yes   HR (bpm) 106   02 Sat (%RA) 96 %   Modified Borg Scale for Dyspnea 0.5- Very, very slight shortness of breath   Perceived Rate of Exertion (Borg) 9- very light     6 Minute walk- Post Test   6 Minute Walk Post Test yes   HR (bpm) 118   02 Sat (%RA) 97 %   Modified Borg Scale for Dyspnea 9- Extremely severe   Perceived Rate of Exertion (Borg) 19-Very, very hard     6 minute walk test results  Aerobic Endurance Distance Walked 488   Endurance additional comments with SPC, slow gait speed, one seated rest break around 4 mins for 1 min before finishing.                      [facility] Adult PT Treatment/Exercise - 12/04/16 0001      Transfers   Transfers Sit to Stand;Stand to Sit   Sit to Stand 5: Supervision   Five time sit to stand comments  19.90 secs    Stand to Sit 5: Supervision     Ambulation/Gait   Ambulation/Gait Yes   Ambulation/Gait Assistance 5: Supervision   Ambulation/Gait Assistance Details 550  6MWT distance plus distance during session.    Assistive device Straight cane   Gait Pattern Step-to pattern;Decreased arm swing - left;Decreased step length - left;Decreased stance time - right;Decreased stride  length;Decreased dorsiflexion - right;Decreased dorsiflexion - left;Decreased weight shift to right;Poor foot clearance - left;Poor foot clearance - right   Ambulation Surface Level;Indoor     Self-Care   Self-Care Other Self-Care Comments   Other Self-Care Comments  Went over initiating walking program to improve endurance.  See pt instruction for details on walking program.      Exercises   Exercises Other Exercises   Other Exercises  standing hip abduction x 10 reps BLE, standing squats x 10 reps, sit<>stand x 10 reps-added to HEP see pt instruction for details.  Also performed seated nustep x 3 mins with BUE/LEs at level 2 resistance.  Tolerated well with HR to 108                PT Education - 12/04/16 1317    Education provided Yes   Education Details results of 6 MWT, walking program and HEP   Person(s) Educated Patient   Methods Explanation;Demonstration;Handout   Comprehension Verbalized understanding;Returned demonstration          PT Short Term Goals - 11/27/16 1347      PT SHORT TERM GOAL #1   Title Patient will verbalize understanding and return demonstration for initial HEP for LE strengthening and flexibility. (TARGET DATE: 12/25/2016)    Time 4   Period Weeks   Status New     PT SHORT TERM GOAL #2   Title PT will perform 5 time sit to stand test, and short term goal will be set. (TARGET DATE: 12/25/2016)   Time 4   Period Weeks   Status New     PT SHORT TERM GOAL #3   Title PT will perform 6 minute walk test, and short term goal will be set for 75 feet longer than distance achieved at baseline. (TARGET DATE: 12/25/2016)    Time 4   Period Weeks   Status New     PT SHORT TERM GOAL #4   Title Patient's gait speed will be > or = 2.37ft/s to indicate a significant change in his gait velocity relative to baseline and a decrease in his risk of falling. (TARGET DATE: 12/25/2016)    Time 4   Period Weeks   Status New     PT SHORT TERM GOAL #5   Title Patient  will demonstrate ability to ambulate 200 feet with LRAD and modified independence without a seated rest break on level, indoor surfaces to indicate an increase in his endurance and decrease in his risk of falling. (TARGET DATE: 12/25/2016)    Time 4   Period Weeks   Status New  Additional Short Term Goals   Additional Short Term Goals Yes     PT SHORT TERM GOAL #6   Title Patient will score >/=17/24 on DGI to indicate a decrease in his risk of falling. (TARGET DATE: 12/25/2016)   Time 4   Period Weeks   Status New           PT Long Term Goals - 11/27/16 1342      PT LONG TERM GOAL #1   Title Patient will verbalize understanding and return demonstration for ongoing HEP for LE strengthening and flexibility. (TARGET DATE: 01/22/2017)    Time 8   Period Weeks   Status New     PT LONG TERM GOAL #2   Title Patient will perform 5 time sit to stand test in < or = 15s to indicate an increase in functional LE strength. (TARGET DATE: 01/22/2017)    Time 8   Period Weeks   Status New     PT LONG TERM GOAL #3   Title PT will perform 6 Minute Walk Test, and long term goal will be set for 150 feet longer than the patient achieves at baseline. (TARGET DATE: 01/22/2017)    Time 8   Period Weeks   Status New     PT LONG TERM GOAL #4   Title Patient will demonstrate ability to ambulate 400 feet with LRAD and supervision on indoor/outdoor surfaces including ramps/curbs/stairs (1 rail) to demonstrate a decrease in his risk of falling when returning to community ambulation. 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 (TARGET DATE: 01/22/2017)    Time 8   Period Weeks   Status New     Additional Long Term Goals   Additional Long Term Goals Yes     PT LONG TERM GOAL #6   Title Patient will improve DGI score to >19/24 to indicate a decrease in his risk of  falling. (TARGET DATE: 01/22/2017)   Time 8   Period Weeks   Status New               Plan - 12/04/16 1318    Clinical Impression Statement Skilled session focused on assessment of functional endurance with 6MWT.  Note distance of 56' indicative of severe endurance deficits and therefore initiated walking program.  Also initiated HEP for BLE strength.  See pt instruction.    Rehab Potential Good   PT Frequency 2x / week   PT Duration 8 weeks   PT Treatment/Interventions ADLs/Self Care Home Management;Neuromuscular re-education;Balance training;Therapeutic exercise;Therapeutic activities;Functional mobility training;Stair training;Gait training;DME Instruction;Patient/family education;Manual techniques   PT Next Visit Plan Check HEP compliance as needed, functional strength, balance and endurance (check HR)    Consulted and Agree with Plan of Care Patient      Patient will benefit from skilled therapeutic intervention in order to improve the following deficits and impairments:  Abnormal gait, Decreased activity tolerance, Decreased balance, Decreased coordination, Decreased safety awareness, Decreased range of motion, Decreased mobility, Decreased knowledge of use of DME, Decreased knowledge of precautions, Decreased endurance, Decreased strength, Difficulty walking, Postural dysfunction, Pain (PT plans to address patient's reports of back pain via functional mobility interventions )  Visit Diagnosis: Other abnormalities of gait and mobility  Repeated falls  Unsteadiness on feet  Muscle weakness (generalized)  Problem List Patient Active Problem List   Diagnosis Date Noted  . Recurrent falls 11/02/2016  . AKI (acute kidney injury) ([place]) 09/24/2016  . History of pulmonary embolus (PE) 09/22/2016  . History of prostate cancer 09/22/2016  . BPH associated with nocturia 06/19/2016  . PE (pulmonary thromboembolism) ([place]) 05/12/2016  . Substance abuse 01/27/2016  . Vitamin  D deficiency 12/05/2015  . Hyperlipidemia 01/28/2015  . Cataracts, bilateral 08/29/2014  . Diabetes mellitus type 2 with complications ([place]) 26/41/5830  . COPD, moderate ([place]) 05/31/2013  . Bilateral lower extremity pain 05/08/2013  . Complex Lt Renal Cyst 03/22/2013  . Preventive measure 05/30/2012  . Normocytic anemia 03/08/2012  . CKD Stage 2 03/08/2012  . ERECTILE DYSFUNCTION 04/25/2008  . Human immunodeficiency virus (HIV) disease ([place]) 04/27/2006  . Smoking greater than 30 pack years 04/27/2006  . Major depressive disorder, recurrent episode ([place]) 04/27/2006  . Essential hypertension 04/27/2006  . DEGENERATIVE JOINT DISEASE 04/27/2006  . Chronic Low Back Pain 04/27/2006   Cameron Sprang, PT, MPT [facility] [address] [place], [place], [address] Phone: [phone]   Fax:  [phone] 12/04/16, 1:25 PM  Name: Alon Mazor MRN: [id number] Date of Birth: [birth year]

## 2016-12-07 ENCOUNTER — Encounter: Payer: Self-pay | Admitting: Rehabilitation

## 2016-12-07 ENCOUNTER — Ambulatory Visit: Payer: Medicare Other | Admitting: Rehabilitation

## 2016-12-07 DIAGNOSIS — M6281 Muscle weakness (generalized): Secondary | ICD-10-CM

## 2016-12-07 DIAGNOSIS — R2689 Other abnormalities of gait and mobility: Secondary | ICD-10-CM

## 2016-12-07 DIAGNOSIS — R296 Repeated falls: Secondary | ICD-10-CM

## 2016-12-07 DIAGNOSIS — R2681 Unsteadiness on feet: Secondary | ICD-10-CM | POA: Diagnosis not present

## 2016-12-07 NOTE — Therapy (Signed)
[facility] [address] [place], [place], [address] Phone: [phone]   Fax:  [phone]  Physical Therapy Treatment  Patient Details  Name: Jacob Joseph MRN: [id number] Date of Birth: 07/21/1955 Referring Provider: Burgess Estelle MD   Encounter Date: 12/07/2016      PT End of Session - 12/07/16 1021    Visit Number 3   Number of Visits 17   Date for PT Re-Evaluation 01/22/17   Authorization Type Medicare & G-codes every 10th visit    PT Start Time 1017   PT Stop Time 1100   PT Time Calculation (min) 43 min   Activity Tolerance Patient tolerated treatment well   Behavior During Therapy [facility] for tasks assessed/performed      Past Medical History:  Diagnosis Date  . Calcium oxalate crystals in urine 12/23/2011   Asymptomatic, no hematuria. Advised to take plenty of water.  [name] Kitchen COPD (chronic obstructive pulmonary disease) ([place])   . Depression   . Diabetes mellitus without complication ([place]) 2119   pre diabetic  . Hepatitis C   . HIV (human immunodeficiency virus infection) ([place])   . Hypertension   . Peripheral arterial disease ([place])    ooccluded left SFA by duplex ultrasound, tibial disease left greater than right  . Stroke ([place]) 11/2011   Carotids Doppler negative. Right sided weakness resolved, initially on Plavix for 3 months and then continued with ASA.    . TB lung, latent 1988   Treated    Past Surgical History:  Procedure Laterality Date  . HERNIA REPAIR    . INGUINAL HERNIA REPAIR  01/16/2012   Procedure: HERNIA REPAIR INGUINAL INCARCERATED;  Surgeon: Zenovia Jarred, MD;  Location: [facility];  Service: General;  Laterality: Right;  . TEE WITHOUT CARDIOVERSION  12/07/2011   Procedure: TRANSESOPHAGEAL ECHOCARDIOGRAM (TEE);  Surgeon: Birdie Riddle, MD;  Location: [facility];  Service: Cardiovascular;  Laterality: N/A;    There were no vitals filed for this visit.      Subjective Assessment -  12/07/16 1019    Subjective Pt reports walking over the weekend for exercise.     Pertinent History HIV, COPD, prostate cancer, HTN, diabetes, history of PE, cocaine and tobacco abuse, recurrent falls   Limitations Lifting;Standing;Walking;House hold activities   Patient Stated Goals reduce back pain; reduce falls    Currently in Pain? Yes   Pain Score 8    Pain Location Back   Pain Orientation Lower   Pain Descriptors / Indicators Aching   Pain Type Chronic pain   Pain Onset More than a month ago   Pain Frequency Intermittent   Aggravating Factors  standing, walking   Pain Relieving Factors lying down, rest                          [facility] Adult PT Treatment/Exercise - 12/07/16 0001      Neuro Re-ed    Neuro Re-ed Details  steps ups while stance foot on foam airdex pad.  Performed x 10 reps BLE with intermittent single UE support as needed.  Standing on rocker board in vertical bias had pt maintain balance x 2 reps of 15 secs followed by head turns side to side,  Moved board to horizontal bias maintaining balance x 2 sets of 15 secs.  Note increased biased to the R, therefore needed cues to maintain midline posture.  Progressed to head turns up/down however needed increased assist following  5 reps.      Exercises   Exercises Other Exercises   Other Exercises  step ups in // bars forward/back x 10 reps both sides.  Progressed to lateral step ups to large 7" step, however this was difficult, therefore moved to 4" step with improved success.  HR 106 following step ups, therefore allowed rest in between each task.   Supine therex for low back pain; posterior pelvic tilt x 10 reps, BLE bridging x 10 reps, and lower pelvic rotation x 10 reps.  Will provide these three at next session to add to HEP esp at times of elevated back pain.                 PT Education - 12/07/16 1340    Education provided Yes   Education Details compliance with HEP   Person(s) Educated  Patient   Methods Explanation   Comprehension Verbalized understanding          PT Short Term Goals - 11/27/16 1347      PT SHORT TERM GOAL #1   Title Patient will verbalize understanding and return demonstration for initial HEP for LE strengthening and flexibility. (TARGET DATE: 12/25/2016)    Time 4   Period Weeks   Status New     PT SHORT TERM GOAL #2   Title PT will perform 5 time sit to stand test, and short term goal will be set. (TARGET DATE: 12/25/2016)   Time 4   Period Weeks   Status New     PT SHORT TERM GOAL #3   Title PT will perform 6 minute walk test, and short term goal will be set for 75 feet longer than distance achieved at baseline. (TARGET DATE: 12/25/2016)    Time 4   Period Weeks   Status New     PT SHORT TERM GOAL #4   Title Patient's gait speed will be > or = 2.15ft/s to indicate a significant change in his gait velocity relative to baseline and a decrease in his risk of falling. (TARGET DATE: 12/25/2016)    Time 4   Period Weeks   Status New     PT SHORT TERM GOAL #5   Title Patient will demonstrate ability to ambulate 200 feet with LRAD and modified independence without a seated rest break on level, indoor surfaces to indicate an increase in his endurance and decrease in his risk of falling. (TARGET DATE: 12/25/2016)    Time 4   Period Weeks   Status New     Additional Short Term Goals   Additional Short Term Goals Yes     PT SHORT TERM GOAL #6   Title Patient will score >/=17/24 on DGI to indicate a decrease in his risk of falling. (TARGET DATE: 12/25/2016)   Time 4   Period Weeks   Status New           PT Long Term Goals - 11/27/16 1342      PT LONG TERM GOAL #1   Title Patient will verbalize understanding and return demonstration for ongoing HEP for LE strengthening and flexibility. (TARGET DATE: 01/22/2017)    Time 8   Period Weeks   Status New     PT LONG TERM GOAL #2   Title Patient will perform 5 time sit to stand test in < or = 15s to  indicate an increase in functional LE strength. (TARGET DATE: 01/22/2017)    Time 8   Period Weeks  Status New     PT LONG TERM GOAL #3   Title PT will perform 6 Minute Walk Test, and long term goal will be set for 150 feet longer than the patient achieves at baseline. (TARGET DATE: 01/22/2017)    Time 8   Period Weeks   Status New     PT LONG TERM GOAL #4   Title Patient will demonstrate ability to ambulate 400 feet with LRAD and supervision on indoor/outdoor surfaces including ramps/curbs/stairs (1 rail) to demonstrate a decrease in his risk of falling when returning to community ambulation. 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 (TARGET DATE: 01/22/2017)    Time 8   Period Weeks   Status New     Additional Long Term Goals   Additional Long Term Goals Yes     PT LONG TERM GOAL #6   Title Patient will improve DGI score to >19/24 to indicate a decrease in his risk of falling. (TARGET DATE: 01/22/2017)   Time 8   Period Weeks   Status New               Plan - 12/07/16 1340    Clinical Impression Statement Skilled session focused on functional strength, balance and endurance.  Also spent a little time to perform some supine core/back exercises to reduce pain. Tolerated well and will plan to give these at next visit.    Rehab Potential Good   PT Frequency 2x / week   PT Duration 8 weeks   PT Treatment/Interventions ADLs/Self Care Home Management;Neuromuscular re-education;Balance training;Therapeutic exercise;Therapeutic activities;Functional mobility training;Stair training;Gait training;DME Instruction;Patient/family education;Manual techniques   PT Next Visit Plan Add posterior pelvic tilt-BLE bridging-and lower pelvic rotation to HEP, functional strength, balance and endurance (check HR)    Consulted and Agree with Plan of Care  Patient      Patient will benefit from skilled therapeutic intervention in order to improve the following deficits and impairments:  Abnormal gait, Decreased activity tolerance, Decreased balance, Decreased coordination, Decreased safety awareness, Decreased range of motion, Decreased mobility, Decreased knowledge of use of DME, Decreased knowledge of precautions, Decreased endurance, Decreased strength, Difficulty walking, Postural dysfunction, Pain (PT plans to address patient's reports of back pain via functional mobility interventions )  Visit Diagnosis: Other abnormalities of gait and mobility  Repeated falls  Unsteadiness on feet  Muscle weakness (generalized)     Problem List Patient Active Problem List   Diagnosis Date Noted  . Recurrent falls 11/02/2016  . AKI (acute kidney injury) ([place]) 09/24/2016  . History of pulmonary embolus (PE) 09/22/2016  . History of prostate cancer 09/22/2016  . BPH associated with nocturia 06/19/2016  . PE (pulmonary thromboembolism) ([place]) 05/12/2016  . Substance abuse 01/27/2016  . Vitamin D deficiency 12/05/2015  . Hyperlipidemia 01/28/2015  . Cataracts, bilateral 08/29/2014  . Diabetes mellitus type 2 with complications ([place]) 54/27/0623  . COPD, moderate ([place]) 05/31/2013  . Bilateral lower extremity pain 05/08/2013  . Complex Lt Renal Cyst 03/22/2013  . Preventive measure 05/30/2012  . Normocytic anemia 03/08/2012  . CKD Stage 2 03/08/2012  . ERECTILE DYSFUNCTION 04/25/2008  . Human immunodeficiency virus (HIV) disease ([place]) 04/27/2006  . Smoking greater than 30 pack years 04/27/2006  . Major depressive disorder, recurrent episode ([place]) 04/27/2006  . Essential hypertension  04/27/2006  . DEGENERATIVE JOINT DISEASE 04/27/2006  . Chronic Low Back Pain 04/27/2006    Cameron Sprang, PT, MPT [facility] [address] [place], [place], [address] Phone: [phone]   Fax:   [phone] 12/07/16, 1:43 PM  Name: Jacob Joseph MRN: [id number] Date of Birth: 05/09/1956

## 2016-12-11 ENCOUNTER — Ambulatory Visit: Payer: Medicare Other | Admitting: Rehabilitation

## 2016-12-11 ENCOUNTER — Encounter: Payer: Self-pay | Admitting: Rehabilitation

## 2016-12-11 DIAGNOSIS — R296 Repeated falls: Secondary | ICD-10-CM

## 2016-12-11 DIAGNOSIS — M6281 Muscle weakness (generalized): Secondary | ICD-10-CM | POA: Diagnosis not present

## 2016-12-11 DIAGNOSIS — R2689 Other abnormalities of gait and mobility: Secondary | ICD-10-CM | POA: Diagnosis not present

## 2016-12-11 DIAGNOSIS — R2681 Unsteadiness on feet: Secondary | ICD-10-CM | POA: Diagnosis not present

## 2016-12-11 NOTE — Addendum Note (Signed)
Addended by: Forde Dandy on: 12/11/2016 02:32 PM   Modules accepted: Orders

## 2016-12-11 NOTE — Patient Instructions (Signed)
Pelvic Tilt: Posterior - Legs Bent (Supine)    Tighten stomach and flatten back by rolling pelvis down. Hold _5___ seconds. Relax. Repeat __10__ times per set. Do _1___ sets per session. Do __1-2__ sessions per day.  http://orth.exer.us/202   Copyright  VHI. All rights reserved.   Bracing With Bridging (Hook-Lying)    With neutral spine, tighten pelvic floor and abdominals and hold. Lift bottom. Repeat _10__ times. Do _1-2__ times a day.   Copyright  VHI. All rights reserved.   Lower Trunk Rotation / Pelvic Opener    Lie on your back with knees bent.   Rotate both legs to one side as far as comfortable, hold for 5-10 secs and then rotate to opposite side for 5-10 secs.  Repeat _10__ times. Do _1-2__ sessions per day.  Copyright  VHI. All rights reserved.

## 2016-12-11 NOTE — Therapy (Signed)
[place] [address] [place], [place], [address] Phone: [phone]   Fax:  [phone]  Physical Therapy Treatment  Patient Details  Name: Jacob Joseph MRN: [id number] Date of Birth: 07/29/1955 Referring Provider: Burgess Estelle MD   Encounter Date: 12/11/2016      PT End of Session - 12/11/16 1255    Visit Number 2   Number of Visits 17   Date for PT Re-Evaluation 01/22/17   Authorization Type Medicare & G-codes every 10th visit    PT Start Time 1101   PT Stop Time 1145   PT Time Calculation (min) 44 min   Activity Tolerance Patient tolerated treatment well   Behavior During Therapy [facility] for tasks assessed/performed      Past Medical History:  Diagnosis Date  . Calcium oxalate crystals in urine 12/23/2011   Asymptomatic, no hematuria. Advised to take plenty of water.  [name] Kitchen COPD (chronic obstructive pulmonary disease) ([place])   . Depression   . Diabetes mellitus without complication ([place]) 7989   pre diabetic  . Hepatitis C   . HIV (human immunodeficiency virus infection) ([place])   . Hypertension   . Peripheral arterial disease ([place])    ooccluded left SFA by duplex ultrasound, tibial disease left greater than right  . Stroke ([place]) 11/2011   Carotids Doppler negative. Right sided weakness resolved, initially on Plavix for 3 months and then continued with ASA.    . TB lung, latent 1988   Treated    Past Surgical History:  Procedure Laterality Date  . HERNIA REPAIR    . INGUINAL HERNIA REPAIR  01/16/2012   Procedure: HERNIA REPAIR INGUINAL INCARCERATED;  Surgeon: Zenovia Jarred, MD;  Location: [place];  Service: General;  Laterality: Right;  . TEE WITHOUT CARDIOVERSION  12/07/2011   Procedure: TRANSESOPHAGEAL ECHOCARDIOGRAM (TEE);  Surgeon: Birdie Riddle, MD;  Location: [place];  Service: Cardiovascular;  Laterality: N/A;    There were no vitals filed for this visit.      Subjective Assessment -  12/11/16 1106    Subjective Pt presents today without cane, states he got rushed out the door.    Pertinent History HIV, COPD, prostate cancer, HTN, diabetes, history of PE, cocaine and tobacco abuse, recurrent falls   Limitations Lifting;Standing;Walking;House hold activities   Patient Stated Goals reduce back pain; reduce falls    Currently in Pain? Yes   Pain Score 7    Pain Location Back   Pain Orientation Lower   Pain Descriptors / Indicators Aching   Pain Type Chronic pain   Pain Onset More than a month ago   Pain Frequency Intermittent   Aggravating Factors  standing, walking    Pain Relieving Factors lying down, rest                         [facility] Adult PT Treatment/Exercise - 12/11/16 0001      Neuro Re-ed    Neuro Re-ed Details  High level balance while standing on red therapy mat; marching forward and backwards x 15' x 2 reps, tandem walking forwards/backwards x 15' x 2 reps.  Progressed to modified SLS exercise with alternating LE cone taps x 7 reps progressing to tipping cone over and back upright x 7 reps. Intermittent min A fading to min/guard.  Continued with balance in // bars side stepping over lower orange barriers.  Emphasized large steps to carry over to gait.  Seated rest breaks  following all tasks due to fatigue. HR up to 107, allowed extended break to decrease HR.       Exercises   Exercises Other Exercises   Other Exercises  Went over HEP for core/low back strength and to reduce pain.  See pt instruction for exercises and reps performed.                  PT Education - 12/11/16 1255    Education provided Yes   Education Details using cane at all times   Person(s) Educated Patient   Methods Explanation   Comprehension Verbalized understanding          PT Short Term Goals - 11/27/16 1347      PT SHORT TERM GOAL #1   Title Patient will verbalize understanding and return demonstration for initial HEP for LE strengthening and  flexibility. (TARGET DATE: 12/25/2016)    Time 4   Period Weeks   Status New     PT SHORT TERM GOAL #2   Title PT will perform 5 time sit to stand test, and short term goal will be set. (TARGET DATE: 12/25/2016)   Time 4   Period Weeks   Status New     PT SHORT TERM GOAL #3   Title PT will perform 6 minute walk test, and short term goal will be set for 75 feet longer than distance achieved at baseline. (TARGET DATE: 12/25/2016)    Time 4   Period Weeks   Status New     PT SHORT TERM GOAL #4   Title Patient's gait speed will be > or = 2.64ft/s to indicate a significant change in his gait velocity relative to baseline and a decrease in his risk of falling. (TARGET DATE: 12/25/2016)    Time 4   Period Weeks   Status New     PT SHORT TERM GOAL #5   Title Patient will demonstrate ability to ambulate 200 feet with LRAD and modified independence without a seated rest break on level, indoor surfaces to indicate an increase in his endurance and decrease in his risk of falling. (TARGET DATE: 12/25/2016)    Time 4   Period Weeks   Status New     Additional Short Term Goals   Additional Short Term Goals Yes     PT SHORT TERM GOAL #6   Title Patient will score >/=17/24 on DGI to indicate a decrease in his risk of falling. (TARGET DATE: 12/25/2016)   Time 4   Period Weeks   Status New           PT Long Term Goals - 11/27/16 1342      PT LONG TERM GOAL #1   Title Patient will verbalize understanding and return demonstration for ongoing HEP for LE strengthening and flexibility. (TARGET DATE: 01/22/2017)    Time 8   Period Weeks   Status New     PT LONG TERM GOAL #2   Title Patient will perform 5 time sit to stand test in < or = 15s to indicate an increase in functional LE strength. (TARGET DATE: 01/22/2017)    Time 8   Period Weeks   Status New     PT LONG TERM GOAL #3   Title PT will perform 6 Minute Walk Test, and long term goal will be set for 150 feet longer than the patient achieves at  baseline. (TARGET DATE: 01/22/2017)    Time 8   Period Weeks   Status  New     PT LONG TERM GOAL #4   Title Patient will demonstrate ability to ambulate 400 feet with LRAD and supervision on indoor/outdoor surfaces including ramps/curbs/stairs (1 rail) to demonstrate a decrease in his risk of falling when returning to community ambulation. 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 (TARGET DATE: 01/22/2017)    Time 8   Period Weeks   Status New     Additional Long Term Goals   Additional Long Term Goals Yes     PT LONG TERM GOAL #6   Title Patient will improve DGI score to >19/24 to indicate a decrease in his risk of falling. (TARGET DATE: 01/22/2017)   Time 8   Period Weeks   Status New               Plan - 12/11/16 1256    Clinical Impression Statement Skilled session focused on going over low back exercises and providing this for HEP and high level balance on compliant surfaces and to simulate single limb stance.  Tolerated well, but did need seated rest breaks following most tasks due to fatigue and some SOB.     Rehab Potential Good   PT Frequency 2x / week   PT Duration 8 weeks   PT Treatment/Interventions ADLs/Self Care Home Management;Neuromuscular re-education;Balance training;Therapeutic exercise;Therapeutic activities;Functional mobility training;Stair training;Gait training;DME Instruction;Patient/family education;Manual techniques   PT Next Visit Plan  functional strength, balance and endurance (check HR)    Consulted and Agree with Plan of Care Patient      Patient will benefit from skilled therapeutic intervention in order to improve the following deficits and impairments:  Abnormal gait, Decreased activity tolerance, Decreased balance, Decreased coordination, Decreased safety awareness, Decreased range of motion, Decreased  mobility, Decreased knowledge of use of DME, Decreased knowledge of precautions, Decreased endurance, Decreased strength, Difficulty walking, Postural dysfunction, Pain (PT plans to address patient's reports of back pain via functional mobility interventions )  Visit Diagnosis: Other abnormalities of gait and mobility  Repeated falls  Unsteadiness on feet  Muscle weakness (generalized)     Problem List Patient Active Problem List   Diagnosis Date Noted  . Recurrent falls 11/02/2016  . AKI (acute kidney injury) ([place]) 09/24/2016  . History of pulmonary embolus (PE) 09/22/2016  . History of prostate cancer 09/22/2016  . BPH associated with nocturia 06/19/2016  . PE (pulmonary thromboembolism) ([place]) 05/12/2016  . Substance abuse 01/27/2016  . Vitamin D deficiency 12/05/2015  . Hyperlipidemia 01/28/2015  . Cataracts, bilateral 08/29/2014  . Diabetes mellitus type 2 with complications ([place]) 96/10/5407  . COPD, moderate ([place]) 05/31/2013  . Bilateral lower extremity pain 05/08/2013  . Complex Lt Renal Cyst 03/22/2013  . Preventive measure 05/30/2012  . Normocytic anemia 03/08/2012  . CKD Stage 2 03/08/2012  . ERECTILE DYSFUNCTION 04/25/2008  . Human immunodeficiency virus (HIV) disease ([place]) 04/27/2006  . Smoking greater than 30 pack years 04/27/2006  . Major depressive disorder, recurrent episode ([place]) 04/27/2006  . Essential hypertension 04/27/2006  . DEGENERATIVE JOINT DISEASE 04/27/2006  . Chronic Low Back Pain 04/27/2006    Cameron Sprang, PT, MPT [facility] [address] [place], [place], [address] Phone: [phone]   Fax:  [phone] 12/11/16, 12:58 PM  Name: Jacob Joseph MRN: [id number] Date  of Birth: 02-28-1956

## 2016-12-18 ENCOUNTER — Ambulatory Visit: Payer: Medicare Other | Attending: Internal Medicine | Admitting: Rehabilitation

## 2016-12-18 DIAGNOSIS — R2689 Other abnormalities of gait and mobility: Secondary | ICD-10-CM | POA: Insufficient documentation

## 2016-12-18 DIAGNOSIS — R2681 Unsteadiness on feet: Secondary | ICD-10-CM | POA: Insufficient documentation

## 2016-12-18 DIAGNOSIS — R296 Repeated falls: Secondary | ICD-10-CM | POA: Insufficient documentation

## 2016-12-18 DIAGNOSIS — M6281 Muscle weakness (generalized): Secondary | ICD-10-CM | POA: Insufficient documentation

## 2016-12-20 ENCOUNTER — Other Ambulatory Visit: Payer: Self-pay | Admitting: Internal Medicine

## 2016-12-20 DIAGNOSIS — B20 Human immunodeficiency virus [HIV] disease: Secondary | ICD-10-CM

## 2016-12-20 DIAGNOSIS — E118 Type 2 diabetes mellitus with unspecified complications: Secondary | ICD-10-CM

## 2016-12-21 ENCOUNTER — Ambulatory Visit: Payer: Medicare Other | Admitting: Rehabilitation

## 2016-12-21 ENCOUNTER — Other Ambulatory Visit: Payer: Self-pay | Admitting: *Deleted

## 2016-12-21 DIAGNOSIS — B20 Human immunodeficiency virus [HIV] disease: Secondary | ICD-10-CM

## 2016-12-21 MED ORDER — DARUNAVIR ETHANOLATE 800 MG PO TABS: ORAL_TABLET | ORAL | 5 refills | Status: DC

## 2016-12-21 MED ORDER — ELVITEG-COBIC-EMTRICIT-TENOFAF 150-150-200-10 MG PO TABS: ORAL_TABLET | ORAL | 5 refills | Status: DC

## 2016-12-24 ENCOUNTER — Other Ambulatory Visit: Payer: Self-pay | Admitting: *Deleted

## 2016-12-24 DIAGNOSIS — B20 Human immunodeficiency virus [HIV] disease: Secondary | ICD-10-CM

## 2016-12-24 MED ORDER — DARUNAVIR ETHANOLATE 800 MG PO TABS: ORAL_TABLET | ORAL | 5 refills | Status: DC

## 2016-12-25 ENCOUNTER — Ambulatory Visit: Payer: Medicare Other | Admitting: Physical Therapy

## 2016-12-25 ENCOUNTER — Encounter: Payer: Self-pay | Admitting: Physical Therapy

## 2016-12-25 DIAGNOSIS — R296 Repeated falls: Secondary | ICD-10-CM

## 2016-12-25 DIAGNOSIS — R2681 Unsteadiness on feet: Secondary | ICD-10-CM

## 2016-12-25 DIAGNOSIS — M6281 Muscle weakness (generalized): Secondary | ICD-10-CM | POA: Diagnosis not present

## 2016-12-25 DIAGNOSIS — R2689 Other abnormalities of gait and mobility: Secondary | ICD-10-CM

## 2016-12-26 NOTE — Therapy (Signed)
[place] [address] [place], [place], [address] Phone: [phone]   Fax:  [phone]  Physical Therapy Treatment  Patient Details  Name: Jacob Joseph MRN: [id number] Date of Birth: August 02, 1955 Referring Provider: Burgess Estelle MD   Encounter Date: 12/25/2016      PT End of Session - 12/25/16 1327    Visit Number 5   Number of Visits 17   Date for PT Re-Evaluation 01/22/17   Authorization Type Medicare & G-codes every 10th visit    PT Start Time 1319   PT Stop Time 1400   PT Time Calculation (min) 41 min   Equipment Utilized During Treatment Gait belt   Activity Tolerance Patient tolerated treatment well   Behavior During Therapy [facility] for tasks assessed/performed      Past Medical History:  Diagnosis Date  . Calcium oxalate crystals in urine 12/23/2011   Asymptomatic, no hematuria. Advised to take plenty of water.  [name] Kitchen COPD (chronic obstructive pulmonary disease) ([place])   . Depression   . Diabetes mellitus without complication ([place]) 1655   pre diabetic  . Hepatitis C   . HIV (human immunodeficiency virus infection) ([place])   . Hypertension   . Peripheral arterial disease ([place])    ooccluded left SFA by duplex ultrasound, tibial disease left greater than right  . Stroke ([place]) 11/2011   Carotids Doppler negative. Right sided weakness resolved, initially on Plavix for 3 months and then continued with ASA.    . TB lung, latent 1988   Treated    Past Surgical History:  Procedure Laterality Date  . HERNIA REPAIR    . INGUINAL HERNIA REPAIR  01/16/2012   Procedure: HERNIA REPAIR INGUINAL INCARCERATED;  Surgeon: Zenovia Jarred, MD;  Location: [place];  Service: General;  Laterality: Right;  . TEE WITHOUT CARDIOVERSION  12/07/2011   Procedure: TRANSESOPHAGEAL ECHOCARDIOGRAM (TEE);  Surgeon: Birdie Riddle, MD;  Location: [place];  Service: Cardiovascular;  Laterality: N/A;    There were no vitals filed for  this visit.      Subjective Assessment - 12/25/16 1323    Subjective No new compaints. Has a fall day before yesterday. Reports he got up from bed and was walking toward bathroom. [place] forward, was able to catch himself with his arms. Landed on his ches as well. Did not hit his head. Rolled over and after collecting himself was able to get himself up. Reports no change in back pain since fall.    Pertinent History HIV, COPD, prostate cancer, HTN, diabetes, history of PE, cocaine and tobacco abuse, recurrent falls   Limitations Lifting;Standing;Walking;House hold activities   Patient Stated Goals reduce back pain; reduce falls    Currently in Pain? Yes   Pain Score 10-Worst pain ever   Pain Location Back   Pain Orientation Lower   Pain Descriptors / Indicators Aching;Stabbing   Pain Type Chronic pain   Pain Onset More than a month ago   Pain Frequency Intermittent   Aggravating Factors  standing , walking   Pain Relieving Factors lying down, rest            [facility] PT Assessment - 12/25/16 1329      Transfers   Transfers Sit to Stand;Stand to Sit   Five time sit to stand comments  24.53 sec with UE support  pt reports back pain limiting this today     Ambulation/Gait   Ambulation/Gait Yes   Ambulation/Gait Assistance 5: Supervision  Ambulation Distance (Feet) --  see 6 minute walk   Assistive device Straight cane  lumbar back brace donned as well   Gait Pattern Step-through pattern;Decreased stride length   Ambulation Surface Level;Indoor   Gait velocity 15.63 sec's= 2.10 ft/sec     6 Minute Walk- Baseline   6 Minute Walk- Baseline yes   BP (mmHg) 131/83   HR (bpm) 91   02 Sat (%RA) 96 %   Modified Borg Scale for Dyspnea 0- Nothing at all   Perceived Rate of Exertion (Borg) 6-     6 Minute walk- Post Test   6 Minute Walk Post Test yes   BP (mmHg) (!)  144/98   HR (bpm) 102   02 Sat (%RA) 98 %   Modified Borg Scale for Dyspnea 3- Moderate shortness of breath or  breathing difficulty   Perceived Rate of Exertion (Borg) 12-     6 minute walk test results    Aerobic Endurance Distance Walked 681   Endurance additional comments with cane. 1 seated rest break around 4 minute mark for ~30 seconds.      Dynamic Gait Index   Level Surface Mild Impairment   Change in Gait Speed Mild Impairment   Gait with Horizontal Head Turns Normal   Gait with Vertical Head Turns Normal   Gait and Pivot Turn Mild Impairment   Step Over Obstacle Mild Impairment   Step Around Obstacles Normal   Steps Mild Impairment   Total Score 19             PT Short Term Goals - 12/25/16 1327      PT SHORT TERM GOAL #1   Title Patient will verbalize understanding and return demonstration for initial HEP for LE strengthening and flexibility. (TARGET DATE: 12/25/2016)    Baseline 12/25/16: met at previous session per note   Status Achieved     PT SHORT TERM GOAL #2   Title PT will perform 5 time sit to stand test, and short term goal will be set. (TARGET DATE: 12/25/2016)   Baseline 12/25/16: this was performed with STG goal never set. retested today to check progress toward LTGs   Time --   Period --   Status Deferred     PT SHORT TERM GOAL #3   Title PT will perform 6 minute walk test, and short term goal will be set for 75 feet longer than distance achieved at baseline. (TARGET DATE: 12/25/2016)    Baseline 12/25/16: met today with increase of    Time --   Period --   Status Achieved     PT SHORT TERM GOAL #4   Title Patient's gait speed will be > or = 2.97f/s to indicate a significant change in his gait velocity relative to baseline and a decrease in his risk of falling. (TARGET DATE: 12/25/2016)    Baseline 12/25/16: progress has been made, however not to goal level   Time --   Period --   Status Partially Met     PT SHORT TERM GOAL #5   Title Patient will demonstrate ability to ambulate 200 feet with LRAD and modified independence without a seated rest break on level,  indoor surfaces to indicate an increase in his endurance and decrease in his risk of falling. (TARGET DATE: 12/25/2016)    Baseline 12/25/16: met during 6 minute walk test   Time --   Period --   Status Achieved     PT SHORT TERM GOAL #  6   Title Patient will score >/=17/24 on DGI to indicate a decrease in his risk of falling. (TARGET DATE: 12/25/2016)   Baseline 12/25/16: 19/24 scored   Time --   Period --   Status Achieved           PT Long Term Goals - 11/27/16 1342      PT LONG TERM GOAL #1   Title Patient will verbalize understanding and return demonstration for ongoing HEP for LE strengthening and flexibility. (TARGET DATE: 01/22/2017)    Time 8   Period Weeks   Status New     PT LONG TERM GOAL #2   Title Patient will perform 5 time sit to stand test in < or = 15s to indicate an increase in functional LE strength. (TARGET DATE: 01/22/2017)    Time 8   Period Weeks   Status New     PT LONG TERM GOAL #3   Title PT will perform 6 Minute Walk Test, and long term goal will be set for 150 feet longer than the patient achieves at baseline. (TARGET DATE: 01/22/2017)    Time 8   Period Weeks   Status New     PT LONG TERM GOAL #4   Title Patient will demonstrate ability to ambulate 400 feet with LRAD and supervision on indoor/outdoor surfaces including ramps/curbs/stairs (1 rail) to demonstrate a decrease in his risk of falling when returning to community ambulation. 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 (TARGET DATE: 01/22/2017)   Time 8   Period Weeks   Status New            Plan -  12/25/16 1327    Clinical Impression Statement Today's skilled session focused on progress toward STGs with 4 goals met, 1 partially met and 1 deferred (no STG was set for 5 x STS, performed today to check progress toward LTG). Pt is progressing well and should benefit from continued PT to progress toward LTGs.    Rehab Potential Good   PT Frequency 2x / week   PT Duration 8 weeks   PT Treatment/Interventions ADLs/Self Care Home Management;Neuromuscular re-education;Balance training;Therapeutic exercise;Therapeutic activities;Functional mobility training;Stair training;Gait training;DME Instruction;Patient/family education;Manual techniques   PT Next Visit Plan  functional strength, balance and endurance (check HR)    Consulted and Agree with Plan of Care Patient      Patient will benefit from skilled therapeutic intervention in order to improve the following deficits and impairments:  Abnormal gait, Decreased activity tolerance, Decreased balance, Decreased coordination, Decreased safety awareness, Decreased range of motion, Decreased mobility, Decreased knowledge of use of DME, Decreased knowledge of precautions, Decreased endurance, Decreased strength, Difficulty walking, Postural dysfunction, Pain (PT plans to address patient's reports of back pain via functional mobility interventions )  Visit Diagnosis: Other abnormalities of gait and mobility  Repeated falls  Unsteadiness on feet  Muscle weakness (generalized)     Problem List Patient Active Problem List  Diagnosis Date Noted  . Recurrent falls 11/02/2016  . AKI (acute kidney injury) ([place]) 09/24/2016  . History of pulmonary embolus (PE) 09/22/2016  . History of prostate cancer 09/22/2016  . BPH associated with nocturia 06/19/2016  . PE (pulmonary thromboembolism) ([place]) 05/12/2016  . Substance abuse 01/27/2016  . Vitamin D deficiency 12/05/2015  . Hyperlipidemia 01/28/2015  . Cataracts, bilateral 08/29/2014  . Diabetes  mellitus type 2 with complications ([place]) 31/51/7616  . COPD, moderate ([place]) 05/31/2013  . Bilateral lower extremity pain 05/08/2013  . Complex Lt Renal Cyst 03/22/2013  . Preventive measure 05/30/2012  . Normocytic anemia 03/08/2012  . CKD Stage 2 03/08/2012  . ERECTILE DYSFUNCTION 04/25/2008  . Human immunodeficiency virus (HIV) disease ([place]) 04/27/2006  . Smoking greater than 30 pack years 04/27/2006  . Major depressive disorder, recurrent episode ([place]) 04/27/2006  . Essential hypertension 04/27/2006  . DEGENERATIVE JOINT DISEASE 04/27/2006  . Chronic Low Back Pain 04/27/2006    Willow Ora, PTA, [facility] [address], [place], [place] [address] [phone] 12/26/16, 2:31 PM    Name: Jacob Joseph MRN: [id number] Date of Birth: [birth year]

## 2016-12-28 ENCOUNTER — Ambulatory Visit: Payer: Medicare Other | Admitting: Physical Therapy

## 2016-12-28 ENCOUNTER — Encounter: Payer: Self-pay | Admitting: Physical Therapy

## 2016-12-28 DIAGNOSIS — R2689 Other abnormalities of gait and mobility: Secondary | ICD-10-CM

## 2016-12-28 DIAGNOSIS — M6281 Muscle weakness (generalized): Secondary | ICD-10-CM

## 2016-12-28 DIAGNOSIS — R296 Repeated falls: Secondary | ICD-10-CM | POA: Diagnosis not present

## 2016-12-28 DIAGNOSIS — R2681 Unsteadiness on feet: Secondary | ICD-10-CM | POA: Diagnosis not present

## 2016-12-28 NOTE — Patient Instructions (Addendum)
Knee to Chest (Flexion)    Pull knee toward chest. Feel stretch in lower back or buttock area. Breathing deeply, Hold __30_ seconds. Repeat with other knee. Repeat _2-3__ times each leg.  Do __1-2__ sessions per day.  http://gt2.exer.us/225   Copyright  VHI. All rights reserved.   Knee-to-Chest Stretch: Bilateral    With hands behind knees, pull both knees in to chest until a comfortable stretch is felt in lower back and buttocks. Keep back relaxed. Hold _30_ seconds. Repeat _3_ times per set. Do _1_ sets per session. Do _1-2_ sessions per day.  http://orth.exer.us/128   Copyright  VHI. All rights reserved.    Lower Trunk Rotation Stretch    Keeping back flat and feet together, rotate knees to left side. Hold _30___ seconds. Repeat to other side and hold for 30 seconds. Repeat _3_ times each way.  Do _1_ sets per session. Do _1+-2_ sessions per day.  http://orth.exer.us/122   Copyright  VHI. All rights reserved.    Chair Sitting    Sit at edge of seat, spine straight, one leg extended. Put a hand on each thigh and bend forward from the hip, keeping spine straight. Allow hand on extended leg to reach toward toes. Support upper body with other arm. Hold _60_ seconds. Repeat _2-3_ times each leg. Do __1-2_ sessions per day.  Copyright  VHI. All rights reserved.

## 2016-12-29 NOTE — Therapy (Signed)
[facility] [address] [place], [place], [address] Phone: [phone]   Fax:  [phone]  Physical Therapy Treatment  Patient Details  Name: Jacob Joseph MRN: [id number] Date of Birth: April 12, 1956 Referring Provider: Burgess Estelle MD   Encounter Date: 12/28/2016      PT End of Session - 12/28/16 1407    Visit Number 6   Number of Visits 17   Date for PT Re-Evaluation 01/22/17   Authorization Type Medicare & G-codes every 10th visit    PT Start Time 1403   PT Stop Time 1445   PT Time Calculation (min) 42 min   Equipment Utilized During Treatment Gait belt   Activity Tolerance Patient tolerated treatment well   Behavior During Therapy [facility] for tasks assessed/performed      Past Medical History:  Diagnosis Date  . Calcium oxalate crystals in urine 12/23/2011   Asymptomatic, no hematuria. Advised to take plenty of water.  [name] Kitchen COPD (chronic obstructive pulmonary disease) ([place])   . Depression   . Diabetes mellitus without complication ([place]) 4628   pre diabetic  . Hepatitis C   . HIV (human immunodeficiency virus infection) ([place])   . Hypertension   . Peripheral arterial disease ([place])    ooccluded left SFA by duplex ultrasound, tibial disease left greater than right  . Stroke ([place]) 11/2011   Carotids Doppler negative. Right sided weakness resolved, initially on Plavix for 3 months and then continued with ASA.    . TB lung, latent 1988   Treated    Past Surgical History:  Procedure Laterality Date  . HERNIA REPAIR    . INGUINAL HERNIA REPAIR  01/16/2012   Procedure: HERNIA REPAIR INGUINAL INCARCERATED;  Surgeon: Zenovia Jarred, MD;  Location: [facility];  Service: General;  Laterality: Right;  . TEE WITHOUT CARDIOVERSION  12/07/2011   Procedure: TRANSESOPHAGEAL ECHOCARDIOGRAM (TEE);  Surgeon: Birdie Riddle, MD;  Location: [facility];  Service: Cardiovascular;  Laterality: N/A;    There were no vitals filed for  this visit.      Subjective Assessment - 12/28/16 1406    Subjective No new complaints. No new falls. Back is a little better today per pt report, continues to use back brace.   Pertinent History HIV, COPD, prostate cancer, HTN, diabetes, history of PE, cocaine and tobacco abuse, recurrent falls   Limitations Lifting;Standing;Walking;House hold activities   Patient Stated Goals reduce back pain; reduce falls    Currently in Pain? Yes   Pain Score 8    Pain Location Back   Pain Orientation Lower   Pain Descriptors / Indicators Aching;Stabbing   Pain Type Chronic pain   Pain Onset More than a month ago   Pain Frequency Constant   Aggravating Factors  standing, walking,   Pain Relieving Factors lying down, resting              [facility] Adult PT Treatment/Exercise - 12/28/16 1408      High Level Balance   High Level Balance Activities Marching forwards;Marching backwards;Tandem walking  tandem fwd/bwd, toe/heel fwd/bwd, cross overs   High Level Balance Comments on red mats next to counter top with light to no UE support as needed: pt performed 3 laps each with min guard assist to min assist for balance. cues on posture and correct ex form/technique  Lumbar Exercises: Stretches   Active Hamstring Stretch 2 reps;60 seconds;Limitations   Active Hamstring Stretch Limitations seated at edge of mat with cues on form and technique.   Single Knee to Chest Stretch 2 reps;30 seconds;Limitations   Single Knee to Chest Stretch Limitations bil sides with cues on form and hold times   Double Knee to Chest Stretch 2 reps;30 seconds;Limitations   Double Knee to Chest Stretch Limitations cues for a gentle pull as pt was grimacing/grunting on 1st rep. improved when he backed off the stretch.    Lower Trunk Rotation 2 reps;30 seconds;Limitations   Lower Trunk Rotation Limitations with red pball under legs/knees:            PT Education - 12/28/16 1427     Education provided Yes   Education Details HEP for LE/lumbar stretching    Person(s) Educated Patient   Methods Explanation;Demonstration;Verbal cues;Handout   Comprehension Verbalized understanding;Returned demonstration;Verbal cues required;Need further instruction          PT Short Term Goals - 12/25/16 1327      PT SHORT TERM GOAL #1   Title Patient will verbalize understanding and return demonstration for initial HEP for LE strengthening and flexibility. (TARGET DATE: 12/25/2016)    Baseline 12/25/16: met at previous session per note   Status Achieved     PT SHORT TERM GOAL #2   Title PT will perform 5 time sit to stand test, and short term goal will be set. (TARGET DATE: 12/25/2016)   Baseline 12/25/16: this was performed with STG goal never set. retested today to check progress toward LTGs   Time --   Period --   Status Deferred     PT SHORT TERM GOAL #3   Title PT will perform 6 minute walk test, and short term goal will be set for 75 feet longer than distance achieved at baseline. (TARGET DATE: 12/25/2016)    Baseline 12/25/16: met today with increase of    Time --   Period --   Status Achieved     PT SHORT TERM GOAL #4   Title Patient's gait speed will be > or = 2.95f/s to indicate a significant change in his gait velocity relative to baseline and a decrease in his risk of falling. (TARGET DATE: 12/25/2016)    Baseline 12/25/16: progress has been made, however not to goal level   Time --   Period --   Status Partially Met     PT SHORT TERM GOAL #5   Title Patient will demonstrate ability to ambulate 200 feet with LRAD and modified independence without a seated rest break on level, indoor surfaces to indicate an increase in his endurance and decrease in his risk of falling. (TARGET DATE: 12/25/2016)    Baseline 12/25/16: met during 6 minute walk test   Time --   Period --   Status Achieved     PT SHORT TERM GOAL #6   Title Patient will score >/=17/24 on DGI to indicate a decrease  in his risk of falling. (TARGET DATE: 12/25/2016)   Baseline 12/25/16: 19/24 scored   Time --   Period --   Status Achieved           PT Long Term Goals - 11/27/16 1342      PT LONG TERM GOAL #1   Title Patient will verbalize understanding and return demonstration for ongoing HEP for LE strengthening and flexibility. (TARGET DATE: 01/22/2017)    Time 8   Period Weeks  Status New     PT LONG TERM GOAL #2   Title Patient will perform 5 time sit to stand test in < or = 15s to indicate an increase in functional LE strength. (TARGET DATE: 01/22/2017)    Time 8   Period Weeks   Status New     PT LONG TERM GOAL #3   Title PT will perform 6 Minute Walk Test, and long term goal will be set for 150 feet longer than the patient achieves at baseline. (TARGET DATE: 01/22/2017)    Time 8   Period Weeks   Status New     PT LONG TERM GOAL #4   Title Patient will demonstrate ability to ambulate 400 feet with LRAD and supervision on indoor/outdoor surfaces including ramps/curbs/stairs (1 rail) to demonstrate a decrease in his risk of falling when returning to community ambulation. 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 (TARGET DATE: 01/22/2017)   Time 8   Period Weeks   Status New            Plan - 12/28/16 1407    Clinical Impression Statement Today's skilled session focused on HEP to address pt's back pain. Remainder of session focused on balance with no issues reported. Pt is making steady progress toward goals and should benefit from continued PT to progress towards unmet goals.        Rehab  Potential Good   PT Frequency 2x / week   PT Duration 8 weeks   PT Treatment/Interventions ADLs/Self Care Home Management;Neuromuscular re-education;Balance training;Therapeutic exercise;Therapeutic activities;Functional mobility training;Stair training;Gait training;DME Instruction;Patient/family education;Manual techniques   PT Next Visit Plan  functional strength, balance and endurance (check HR)    Consulted and Agree with Plan of Care Patient      Patient will benefit from skilled therapeutic intervention in order to improve the following deficits and impairments:  Abnormal gait, Decreased activity tolerance, Decreased balance, Decreased coordination, Decreased safety awareness, Decreased range of motion, Decreased mobility, Decreased knowledge of use of DME, Decreased knowledge of precautions, Decreased endurance, Decreased strength, Difficulty walking, Postural dysfunction, Pain (PT plans to address patient's reports of back pain via functional mobility interventions )  Visit Diagnosis: Other abnormalities of gait and mobility  Repeated falls  Unsteadiness on feet  Muscle weakness (generalized)     Problem List Patient Active Problem List   Diagnosis Date Noted  . Recurrent falls 11/02/2016  . AKI (acute kidney injury) ([place] 09/24/2016  . History of pulmonary embolus (PE) 09/22/2016  . History of prostate cancer 09/22/2016  . BPH associated with nocturia 06/19/2016  . PE (pulmonary thromboembolism) ([place] 05/12/2016  . Substance abuse 01/27/2016  . Vitamin D deficiency 12/05/2015  . Hyperlipidemia 01/28/2015  . Cataracts, bilateral 08/29/2014  . Diabetes mellitus type 2 with complications ([place] 054/03/8118 . COPD, moderate ([place] 05/31/2013  . Bilateral lower extremity pain 05/08/2013  . Complex Lt Renal Cyst 03/22/2013  . Preventive measure 05/30/2012  .  Normocytic anemia 03/08/2012  . CKD Stage 2 03/08/2012  . ERECTILE DYSFUNCTION 04/25/2008  . Human  immunodeficiency virus (HIV) disease ([place]) 04/27/2006  . Smoking greater than 30 pack years 04/27/2006  . Major depressive disorder, recurrent episode ([place]) 04/27/2006  . Essential hypertension 04/27/2006  . DEGENERATIVE JOINT DISEASE 04/27/2006  . Chronic Low Back Pain 04/27/2006    Willow Ora, PTA, [facility] Neuro [facility] [address], [place], [place] [address] [phone] 12/29/16, 12:22 PM   Name: Jacob Joseph MRN: [id number] Date of Birth: 1955/08/25

## 2017-01-01 ENCOUNTER — Ambulatory Visit: Payer: Medicare Other | Admitting: Physical Therapy

## 2017-01-01 DIAGNOSIS — M6281 Muscle weakness (generalized): Secondary | ICD-10-CM | POA: Diagnosis not present

## 2017-01-01 DIAGNOSIS — R2681 Unsteadiness on feet: Secondary | ICD-10-CM

## 2017-01-01 DIAGNOSIS — R296 Repeated falls: Secondary | ICD-10-CM | POA: Diagnosis not present

## 2017-01-01 DIAGNOSIS — R2689 Other abnormalities of gait and mobility: Secondary | ICD-10-CM | POA: Diagnosis not present

## 2017-01-02 NOTE — Therapy (Signed)
[facility] [address] [place], [place], [address] Phone: [phone]   Fax:  [phone]  Physical Therapy Treatment  Patient Details  Name: Jacob Joseph MRN: [id number] Date of Birth: April 01, 1956 Referring Provider: Burgess Estelle MD   Encounter Date: 01/01/2017      PT End of Session - 01/02/17 1113    Visit Number 7   Number of Visits 17   Date for PT Re-Evaluation 01/22/17   Authorization Type Medicare & G-codes every 10th visit    PT Start Time 1316   PT Stop Time 1400   PT Time Calculation (min) 44 min      Past Medical History:  Diagnosis Date  . Calcium oxalate crystals in urine 12/23/2011   Asymptomatic, no hematuria. Advised to take plenty of water.  [name] Kitchen COPD (chronic obstructive pulmonary disease) ([place])   . Depression   . Diabetes mellitus without complication ([place]) 8099   pre diabetic  . Hepatitis C   . HIV (human immunodeficiency virus infection) ([place])   . Hypertension   . Peripheral arterial disease ([place])    ooccluded left SFA by duplex ultrasound, tibial disease left greater than right  . Stroke ([place]) 11/2011   Carotids Doppler negative. Right sided weakness resolved, initially on Plavix for 3 months and then continued with ASA.    . TB lung, latent 1988   Treated    Past Surgical History:  Procedure Laterality Date  . HERNIA REPAIR    . INGUINAL HERNIA REPAIR  01/16/2012   Procedure: HERNIA REPAIR INGUINAL INCARCERATED;  Surgeon: Zenovia Jarred, MD;  Location: [facility];  Service: General;  Laterality: Right;  . TEE WITHOUT CARDIOVERSION  12/07/2011   Procedure: TRANSESOPHAGEAL ECHOCARDIOGRAM (TEE);  Surgeon: Birdie Riddle, MD;  Location: [facility];  Service: Cardiovascular;  Laterality: N/A;    There were no vitals filed for this visit.      Subjective Assessment - 01/02/17 1106    Subjective Pt reports back pain is about the same - did some of the stretches at home; pt wearing back  brace   Pertinent History HIV, COPD, prostate cancer, HTN, diabetes, history of PE, cocaine and tobacco abuse, recurrent falls   Patient Stated Goals reduce back pain; reduce falls    Currently in Pain? Yes   Pain Score 8    Pain Location Back   Pain Orientation Lower   Pain Descriptors / Indicators Aching;Dull   Pain Type Chronic pain   Pain Onset More than a month ago                         [facility] Adult PT Treatment/Exercise - 01/02/17 0001      Transfers   Transfers Sit to Stand   Sit to Stand 5: Supervision   Stand to Sit 5: Supervision   Number of Reps Other reps (comment)  5 reps wtih UE support prn     Lumbar Exercises: Stretches   Active Hamstring Stretch 1 rep;30 seconds  RLE and LLE   Single Knee to Chest Stretch 2 reps;30 seconds  RLE and LLE   Double Knee to Chest Stretch 2 reps;30 seconds   Lower Trunk Rotation 2 reps;30 seconds  to each side   Pelvic Tilt 5 reps  5 sec hold   Quadruped Mid Back Stretch 3 reps  cat/camel   Quadruped Mid Back Stretch Limitations c/o back pain with AROM     Lumbar Exercises:  Supine   Bridge 5 reps     Knee/Hip Exercises: Standing   Forward Step Up Right;Left;1 set;10 reps;Hand Hold: 1;Step Height: 6"   Functional Squat 1 set;10 reps  from mat table     Pt performed lifting opposite arm/leg in quadruped position - attempted as he had difficulty extending LLE with UE lifted In flexion - attempted 3 reps; performed with RLE/LUE 3 reps 3 sec hold        Balance Exercises - 01/02/17 1116      Balance Exercises: Standing   Rockerboard Anterior/posterior;EO;30 seconds   Sidestepping 1 rep  inside bars   Other Standing Exercises Pt performed standing on blue [employer] - marching in place x 10 reps; tapping down to floor with each foot 5 reps each with min assist:  tap ups to 6" step with UE support prn             PT Short Term Goals - 12/25/16 1327      PT SHORT TERM GOAL #1   Title Patient will  verbalize understanding and return demonstration for initial HEP for LE strengthening and flexibility. (TARGET DATE: 12/25/2016)    Baseline 12/25/16: met at previous session per note   Status Achieved     PT SHORT TERM GOAL #2   Title PT will perform 5 time sit to stand test, and short term goal will be set. (TARGET DATE: 12/25/2016)   Baseline 12/25/16: this was performed with STG goal never set. retested today to check progress toward LTGs   Time --   Period --   Status Deferred     PT SHORT TERM GOAL #3   Title PT will perform 6 minute walk test, and short term goal will be set for 75 feet longer than distance achieved at baseline. (TARGET DATE: 12/25/2016)    Baseline 12/25/16: met today with increase of    Time --   Period --   Status Achieved     PT SHORT TERM GOAL #4   Title Patient's gait speed will be > or = 2.23f/s to indicate a significant change in his gait velocity relative to baseline and a decrease in his risk of falling. (TARGET DATE: 12/25/2016)    Baseline 12/25/16: progress has been made, however not to goal level   Time --   Period --   Status Partially Met     PT SHORT TERM GOAL #5   Title Patient will demonstrate ability to ambulate 200 feet with LRAD and modified independence without a seated rest break on level, indoor surfaces to indicate an increase in his endurance and decrease in his risk of falling. (TARGET DATE: 12/25/2016)    Baseline 12/25/16: met during 6 minute walk test   Time --   Period --   Status Achieved     PT SHORT TERM GOAL #6   Title Patient will score >/=17/24 on DGI to indicate a decrease in his risk of falling. (TARGET DATE: 12/25/2016)   Baseline 12/25/16: 19/24 scored   Time --   Period --   Status Achieved           PT Long Term Goals - 11/27/16 1342      PT LONG TERM GOAL #1   Title Patient will verbalize understanding and return demonstration for ongoing HEP for LE strengthening and flexibility. (TARGET DATE: 01/22/2017)    Time 8   Period  Weeks   Status New     PT LONG TERM GOAL #2  Title Patient will perform 5 time sit to stand test in < or = 15s to indicate an increase in functional LE strength. (TARGET DATE: 01/22/2017)    Time 8   Period Weeks   Status New     PT LONG TERM GOAL #3   Title PT will perform 6 Minute Walk Test, and long term goal will be set for 150 feet longer than the patient achieves at baseline. (TARGET DATE: 01/22/2017)    Time 8   Period Weeks   Status New     PT LONG TERM GOAL #4   Title Patient will demonstrate ability to ambulate 400 feet with LRAD and supervision on indoor/outdoor surfaces including ramps/curbs/stairs (1 rail) to demonstrate a decrease in his risk of falling when returning to community ambulation. 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 (TARGET DATE: 01/22/2017)   Time 8   Period Weeks   Status New               Plan - 01/01/17 1317    Clinical Impression Statement Pt had dyspnea with standing activities - required short seated rest breaks; pt had difficulty lifting LLE in quadruped position due to hip extensor weakness   Rehab Potential Good   PT Frequency 2x / week   PT Duration 8 weeks   PT Treatment/Interventions ADLs/Self Care Home Management;Neuromuscular re-education;Balance training;Therapeutic exercise;Therapeutic activities;Functional mobility training;Stair training;Gait training;DME Instruction;Patient/family education;Manual techniques   PT Next Visit Plan  functional strength, balance and endurance (check HR)    Consulted and Agree with Plan of Care Patient       Patient will benefit from skilled therapeutic intervention in order to improve the following deficits and impairments:  Abnormal gait, Decreased activity tolerance, Decreased balance, Decreased coordination, Decreased safety awareness, Decreased range of motion, Decreased mobility, Decreased knowledge of use of DME, Decreased knowledge of precautions, Decreased endurance, Decreased strength, Difficulty walking, Postural dysfunction, Pain  Visit Diagnosis: Muscle weakness (generalized)  Unsteadiness on feet     Problem List Patient Active Problem List   Diagnosis Date Noted  . Recurrent falls 11/02/2016  . AKI (acute kidney injury) ([place] 09/24/2016  . History of pulmonary embolus (PE) 09/22/2016  . History of prostate cancer 09/22/2016  . BPH associated with nocturia 06/19/2016  . PE (pulmonary thromboembolism) ([place] 05/12/2016  . Substance abuse 01/27/2016  . Vitamin D deficiency 12/05/2015  . Hyperlipidemia 01/28/2015  . Cataracts, bilateral 08/29/2014  . Diabetes mellitus type 2 with complications ([place] 091/79/1505 . COPD, moderate ([place] 05/31/2013  . Bilateral lower extremity pain 05/08/2013  . Complex Lt Renal Cyst 03/22/2013  . Preventive measure 05/30/2012  . Normocytic anemia 03/08/2012  . CKD Stage 2 03/08/2012  . ERECTILE DYSFUNCTION 04/25/2008  . Human immunodeficiency virus (HIV) disease ([place] 04/27/2006  . Smoking greater than 30 pack years 04/27/2006  . Major depressive disorder, recurrent episode ([place] 04/27/2006  . Essential hypertension 04/27/2006  . DEGENERATIVE JOINT DISEASE 04/27/2006  .  Chronic Low Back Pain 04/27/2006    Alda Lea, PT 01/02/2017, 11:21 AM  [facility] [address] [name], [place], [address] Phone: [phone]   Fax:  [phone]  Name: Jacob Joseph MRN: [id number] Date of Birth: 1956/04/19

## 2017-01-04 ENCOUNTER — Telehealth: Payer: Self-pay | Admitting: Pharmacist

## 2017-01-05 ENCOUNTER — Ambulatory Visit: Payer: Medicare Other | Admitting: Physical Therapy

## 2017-01-05 ENCOUNTER — Encounter: Payer: Self-pay | Admitting: Physical Therapy

## 2017-01-05 DIAGNOSIS — R296 Repeated falls: Secondary | ICD-10-CM | POA: Diagnosis not present

## 2017-01-05 DIAGNOSIS — M6281 Muscle weakness (generalized): Secondary | ICD-10-CM

## 2017-01-05 DIAGNOSIS — R2681 Unsteadiness on feet: Secondary | ICD-10-CM

## 2017-01-05 DIAGNOSIS — R2689 Other abnormalities of gait and mobility: Secondary | ICD-10-CM

## 2017-01-06 ENCOUNTER — Ambulatory Visit: Payer: Medicare Other | Admitting: Pharmacist

## 2017-01-06 NOTE — Progress Notes (Signed)
Contacted patient for follow up medication help. Patient requested an appointment to review medications. Appointment scheduled.

## 2017-01-07 ENCOUNTER — Encounter: Payer: Self-pay | Admitting: Pharmacist

## 2017-01-07 ENCOUNTER — Ambulatory Visit: Payer: Medicare Other | Admitting: Pharmacist

## 2017-01-07 DIAGNOSIS — J441 Chronic obstructive pulmonary disease with (acute) exacerbation: Secondary | ICD-10-CM

## 2017-01-07 DIAGNOSIS — E118 Type 2 diabetes mellitus with unspecified complications: Secondary | ICD-10-CM

## 2017-01-07 DIAGNOSIS — J449 Chronic obstructive pulmonary disease, unspecified: Secondary | ICD-10-CM

## 2017-01-07 MED ORDER — BECLOMETHASONE DIPROPIONATE 80 MCG/ACT NA AERS: 1.0000 | INHALATION_SPRAY | Freq: Every day | NASAL | 11 refills | Status: DC

## 2017-01-07 MED ORDER — NICOTINE 7 MG/24HR TD PT24: MEDICATED_PATCH | TRANSDERMAL | 3 refills | Status: DC

## 2017-01-07 MED ORDER — NICOTINE POLACRILEX 2 MG MT LOZG: LOZENGE | OROMUCOSAL | 3 refills | Status: DC

## 2017-01-07 MED ORDER — ALBUTEROL SULFATE HFA 108 (90 BASE) MCG/ACT IN AERS: INHALATION_SPRAY | RESPIRATORY_TRACT | 3 refills | Status: DC

## 2017-01-07 MED ORDER — TIOTROPIUM BROMIDE-OLODATEROL 2.5-2.5 MCG/ACT IN AERS: 2.0000 | INHALATION_SPRAY | Freq: Every day | RESPIRATORY_TRACT | 3 refills | Status: DC

## 2017-01-07 NOTE — Therapy (Signed)
[facility] [address] [place], [place], [address] Phone: [phone]   Fax:  [phone]  Physical Therapy Treatment  Patient Details  Name: Jacob Joseph MRN: [id number] Date of Birth: 31-Jul-1955 Referring Provider: Burgess Estelle MD   Encounter Date: 01/05/2017     01/05/17 1239  PT Visits / Re-Eval  Visit Number 8  Number of Visits 17  Date for PT Re-Evaluation 01/22/17  Authorization  Authorization Type Medicare & G-codes every 10th visit   PT Time Calculation  PT Start Time 1235  PT Stop Time 1315  PT Time Calculation (min) 40 min  PT - End of Session  Equipment Utilized During Treatment Gait belt  Activity Tolerance Patient tolerated treatment well  Behavior During Therapy [facility] for tasks assessed/performed    Past Medical History:  Diagnosis Date  . Calcium oxalate crystals in urine 12/23/2011   Asymptomatic, no hematuria. Advised to take plenty of water.  [name] Kitchen COPD (chronic obstructive pulmonary disease) ([place])   . Depression   . Diabetes mellitus without complication ([place]) 3235   pre diabetic  . Hepatitis C   . HIV (human immunodeficiency virus infection) ([place])   . Hypertension   . Peripheral arterial disease ([place])    ooccluded left SFA by duplex ultrasound, tibial disease left greater than right  . Stroke ([place]) 11/2011   Carotids Doppler negative. Right sided weakness resolved, initially on Plavix for 3 months and then continued with ASA.    . TB lung, latent 1988   Treated    Past Surgical History:  Procedure Laterality Date  . HERNIA REPAIR    . INGUINAL HERNIA REPAIR  01/16/2012   Procedure: HERNIA REPAIR INGUINAL INCARCERATED;  Surgeon: Zenovia Jarred, MD;  Location: [facility];  Service: General;  Laterality: Right;  . TEE WITHOUT CARDIOVERSION  12/07/2011   Procedure: TRANSESOPHAGEAL ECHOCARDIOGRAM (TEE);  Surgeon: Birdie Riddle, MD;  Location: [facility];  Service: Cardiovascular;   Laterality: N/A;    There were no vitals filed for this visit.     01/05/17 1237  Symptoms/Limitations  Subjective No new complaints. No change in back pain. Reports stretches are going well at home. Continues to wear back brace to PT.   Pertinent History HIV, COPD, prostate cancer, HTN, diabetes, history of PE, cocaine and tobacco abuse, recurrent falls  Limitations Lifting;Standing;Walking;House hold activities  Patient Stated Goals reduce back pain; reduce falls   Pain Assessment  Currently in Pain? Yes  Pain Score 7  Pain Location Back  Pain Orientation Lower  Pain Descriptors / Indicators Aching;Dull  Pain Type Chronic pain  Pain Onset More than a month ago  Pain Frequency Constant  Aggravating Factors  increased activity and standing  Pain Relieving Factors lying down on back, stretches      01/05/17 0001  High Level Balance  High Level Balance Activities Tandem walking;Marching forwards;Marching backwards  High Level Balance Comments on blue mat next to counter x 3 laps each/each way with min guard assist and occasional light touch to counter for balance  Exercises  Other Exercises  Seated on large blue pball: pelvic rocks left<>right and anterior/posteior x 10, bouncing with tall posture x 1 minutes, pelvic circles x 10 each way; with green theraband: shoulder horizontal abdct x 10, alternating diagonal pulls x 10 reps each way. performed all with cues on abdominal bracing and ex form  Lumbar Exercises: Stretches  Single Knee to Chest Stretch 2 reps;30 seconds  Single Knee to Chest Stretch  Limitations bil sides with cues on form and hold times  Double Knee to Chest Stretch 2 reps;30 seconds  Double Knee to Chest Stretch Limitations cues to only pull until a gentle stretch is felt. cues for hold times.      01/05/17 1302  Balance Exercises: Standing  SLS with Vectors Foam/compliant surface;Other reps (comment);Limitations  Rockerboard  Anterior/posterior;Lateral;EO;EC;30 seconds;10 reps  Balance Exercises: Standing  SLS with Vectors Limitations 2 tall cones on blue mat next to counter top with single UE support for balance: alternating fwd toe taps x 10 reps, alternating cross toe taps x 10 reps with min guard assist. cues on posture and weight shifting to assist with balance                Rebounder Limitations performed both ways on board with no UE support: EC rocking board x 10 reps, EC no head movements 30 sec's x 2 reps, min guard to min assist for balance.           PT Short Term Goals - 12/25/16 1327      PT SHORT TERM GOAL #1   Title Patient will verbalize understanding and return demonstration for initial HEP for LE strengthening and flexibility. (TARGET DATE: 12/25/2016)    Baseline 12/25/16: met at previous session per note   Status Achieved     PT SHORT TERM GOAL #2   Title PT will perform 5 time sit to stand test, and short term goal will be set. (TARGET DATE: 12/25/2016)   Baseline 12/25/16: this was performed with STG goal never set. retested today to check progress toward LTGs   Time --   Period --   Status Deferred     PT SHORT TERM GOAL #3   Title PT will perform 6 minute walk test, and short term goal will be set for 75 feet longer than distance achieved at baseline. (TARGET DATE: 12/25/2016)    Baseline 12/25/16: met today with increase of    Time --   Period --   Status Achieved     PT SHORT TERM GOAL #4   Title Patient's gait speed will be > or = 2.25f/s to indicate a significant change in his gait velocity relative to baseline and a decrease in his risk of falling. (TARGET DATE: 12/25/2016)    Baseline 12/25/16: progress has been made, however not to goal level   Time --   Period --   Status Partially Met     PT SHORT TERM GOAL #5   Title Patient will demonstrate ability to ambulate 200 feet with LRAD and modified independence without a seated rest break on level, indoor surfaces to indicate an  increase in his endurance and decrease in his risk of falling. (TARGET DATE: 12/25/2016)    Baseline 12/25/16: met during 6 minute walk test   Time --   Period --   Status Achieved     PT SHORT TERM GOAL #6   Title Patient will score >/=17/24 on DGI to indicate a decrease in his risk of falling. (TARGET DATE: 12/25/2016)   Baseline 12/25/16: 19/24 scored   Time --   Period --   Status Achieved           PT Long Term Goals - 11/27/16 1342      PT LONG TERM GOAL #1   Title Patient will verbalize understanding and return demonstration for ongoing HEP for LE strengthening and flexibility. (TARGET DATE: 01/22/2017)    Time 8  Period Weeks   Status New     PT LONG TERM GOAL #2   Title Patient will perform 5 time sit to stand test in < or = 15s to indicate an increase in functional LE strength. (TARGET DATE: 01/22/2017)    Time 8   Period Weeks   Status New     PT LONG TERM GOAL #3   Title PT will perform 6 Minute Walk Test, and long term goal will be set for 150 feet longer than the patient achieves at baseline. (TARGET DATE: 01/22/2017)    Time 8   Period Weeks   Status New     PT LONG TERM GOAL #4   Title Patient will demonstrate ability to ambulate 400 feet with LRAD and supervision on indoor/outdoor surfaces including ramps/curbs/stairs (1 rail) to demonstrate a decrease in his risk of falling when returning to community ambulation. 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 (TARGET DATE: 01/22/2017)   Time 8   Period Weeks   Status New        01/05/17 1239  Plan  Clinical Impression  Statement Today's skilled session continued to address core strengthening and high level balance activities without any increase in pain reported. Pt is progressing towards goals and should benefit from continued PT to progress toward unmet goals.                            Pt will benefit from skilled therapeutic intervention in order to improve on the following deficits Abnormal gait;Decreased activity tolerance;Decreased balance;Decreased coordination;Decreased safety awareness;Decreased range of motion;Decreased mobility;Decreased knowledge of use of DME;Decreased knowledge of precautions;Decreased endurance;Decreased strength;Difficulty walking;Postural dysfunction;Pain  Rehab Potential Good  PT Frequency 2x / week  PT Duration 8 weeks  PT Treatment/Interventions ADLs/Self Care Home Management;Neuromuscular re-education;Balance training;Therapeutic exercise;Therapeutic activities;Functional mobility training;Stair training;Gait training;DME Instruction;Patient/family education;Manual techniques  PT Next Visit Plan functional strength, balance and endurance (check HR)   Consulted and Agree with Plan of Care Patient     Patient will benefit from skilled therapeutic intervention in order to improve the following deficits and impairments:  Abnormal gait, Decreased activity tolerance, Decreased balance, Decreased coordination, Decreased safety awareness, Decreased range of motion, Decreased mobility, Decreased knowledge of use of DME, Decreased knowledge of precautions, Decreased endurance, Decreased strength, Difficulty walking, Postural dysfunction, Pain  Visit Diagnosis: Muscle weakness (generalized)  Unsteadiness on feet  Other abnormalities of gait and mobility  Repeated falls     Problem List Patient Active Problem List   Diagnosis Date Noted  . Recurrent falls 11/02/2016  . [place] injury) [place] 09/24/2016  . History of pulmonary embolus (PE) 09/22/2016  . History of  prostate cancer 09/22/2016  . BPH associated with nocturia 06/19/2016  . PE (pulmonary thromboembolism) ([place] 05/12/2016  . Substance abuse 01/27/2016  . Vitamin D deficiency 12/05/2015  . Hyperlipidemia 01/28/2015  . Cataracts, bilateral 08/29/2014  .  Diabetes mellitus type 2 with complications ([place]) 30/13/1438  . COPD, moderate ([place]) 05/31/2013  . Bilateral lower extremity pain 05/08/2013  . Complex Lt Renal Cyst 03/22/2013  . Preventive measure 05/30/2012  . Normocytic anemia 03/08/2012  . CKD Stage 2 03/08/2012  . ERECTILE DYSFUNCTION 04/25/2008  . Human immunodeficiency virus (HIV) disease ([place]) 04/27/2006  . Smoking greater than 30 pack years 04/27/2006  . Major depressive disorder, recurrent episode ([place]) 04/27/2006  . Essential hypertension 04/27/2006  . DEGENERATIVE JOINT DISEASE 04/27/2006  . Chronic Low Back Pain 04/27/2006    Willow Ora, PTA, [facility] [address], [place], [place] [address] [phone] 01/07/17, 12:27 AM   Name: Jacob Joseph MRN: [id number] Date of Birth: 03-06-1956

## 2017-01-07 NOTE — Progress Notes (Signed)
Jacob Joseph is a 61 y.o. male who was contacted for follow up on COPD medication management.   COPD medications:  - albuterol (ProAir HFA) 108 MCG  - tiotropium-olodaterol (Stiolto) 2.5-2.5 MCG  - Budesonide 90 MCG - nicotine 7mg  patch  Pt reports nonadherence. Pt reports taking Stiolto 1 puff once daily, prescribed 2 puffs twice daily. Per UpToDate, Sitolto should be prescribed 2 puffs once daily and was changed on prescription and sent to [facility]. Pt reports requiring ProAir rescue inhaler once daily on "bad days" usually when he is outside. Pt reports that he is running out of both Stiolto and ProAir and requested samples. Pt reports having SOB when walking. Pt denies side effects from COPD medications. Pt denies any trouble getting medications refilled. Pt reports difficulty affording medications due to his insurance. Pt is still smoking 1-2 cigarettes per day and is currently using nicotine 7mg  patches. Pt would like more patches to continue with smoking cessation plan. Pt believes that he could get down to zero cigarettes per day if oral nicotine replacement therapy is added. Pt cannot use nicotine gum as he has dentures. Pt was prescribed nicotine 2mg  lozenges and counseled on appropriate use.    Advised patient to contact clinic if concerns or symptoms arise. Patient verbalized understanding.  Maryan Char, PharmD Candidate

## 2017-01-08 ENCOUNTER — Ambulatory Visit: Payer: Medicare Other | Admitting: Rehabilitation

## 2017-01-26 DIAGNOSIS — C61 Malignant neoplasm of prostate: Secondary | ICD-10-CM | POA: Diagnosis not present

## 2017-01-27 ENCOUNTER — Ambulatory Visit: Payer: Medicare Other | Admitting: Physical Therapy

## 2017-01-28 ENCOUNTER — Other Ambulatory Visit: Payer: Self-pay | Admitting: Internal Medicine

## 2017-01-28 DIAGNOSIS — A6000 Herpesviral infection of urogenital system, unspecified: Secondary | ICD-10-CM

## 2017-01-28 DIAGNOSIS — C775 Secondary and unspecified malignant neoplasm of intrapelvic lymph nodes: Secondary | ICD-10-CM | POA: Diagnosis not present

## 2017-01-28 DIAGNOSIS — C61 Malignant neoplasm of prostate: Secondary | ICD-10-CM | POA: Diagnosis not present

## 2017-01-29 ENCOUNTER — Ambulatory Visit: Payer: Medicare Other | Attending: Internal Medicine | Admitting: Rehabilitation

## 2017-01-29 ENCOUNTER — Encounter: Payer: Self-pay | Admitting: Rehabilitation

## 2017-01-29 DIAGNOSIS — R296 Repeated falls: Secondary | ICD-10-CM | POA: Diagnosis not present

## 2017-01-29 DIAGNOSIS — M6281 Muscle weakness (generalized): Secondary | ICD-10-CM

## 2017-01-29 DIAGNOSIS — R2689 Other abnormalities of gait and mobility: Secondary | ICD-10-CM | POA: Diagnosis not present

## 2017-01-29 DIAGNOSIS — R2681 Unsteadiness on feet: Secondary | ICD-10-CM | POA: Diagnosis not present

## 2017-01-29 NOTE — Patient Instructions (Signed)
Functional Quadriceps: Sit to Stand    Sit on edge of chair, feet flat on floor. Stand upright, extending knees fully.  Start by using one hand if you need to.  Work your way to using no hands.  Repeat __10__ times per set. Do __1__ sets per session. Do __1-2__ sessions per day.  http://orth.exer.us/734   Copyright  VHI. All rights reserved.    WALKING  Walking is a great form of exercise to increase your strength, endurance and overall fitness.  A walking program can help you start slowly and gradually build endurance as you go.  Everyone's ability is different, so each person's starting point will be different.  You do not have to follow them exactly.  The are just samples. You should simply find out what's right for you and stick to that program.   In the beginning, you'll start off walking 2-3 times a day for short distances.  As you get stronger, you'll be walking further at just 1-2 times per day.  A.         You Can Walk For A Certain Length Of Time Each Day                          Walk 4 minutes 2-3 times per day.             Increase 1 minutes every 7 days (3 times per day).             Work up to 25-30 minutes (1-2 times per day).              Example:                         Day 1-2           4 minutes        3 times per day                         Day 7-8           5 minutes        2-3 times per day                         Day 13-14       6 minutes        1-2 times per day  B.         You Can Walk For a Certain Distance Each Day                                      Distance can be substituted for time.                          Example:                         3 trips to mailbox (at road)                         3 trips to corner of block                         3  trips around the block   Please only do the exercises that your therapist has initialed and dated  ABDUCTION: Standing (Active)    Stand, feet flat. Lift right leg out to side. Stand tall.   Face a counter top so that you have some support.   Complete __1_ sets of _10__ repetitions. Perform __1-2_ sessions per day.  http://gtsc.exer.us/110   Copyright  VHI. All rights reserved.   Mini Squat: Double Leg    With feet shoulder width apart, reach forward for balance and do a mini squat. Keep knees in line with second toe. Knees do not go past toes.  Stand facing counter top.   Repeat _10__ times per set. Rest _60-90__ seconds after set. Do _1__ sets per session.  http://plyo.exer.us/70   Copyright  VHI. All rights reserved.   Knee to Chest (Flexion)    Pull knee toward chest. Feel stretch in lower back or buttock area. Breathing deeply, Hold __30_ seconds. Repeat with other knee. Repeat _2-3__ times each leg.  Do __1-2__ sessions per day.  http://gt2.exer.us/225   Copyright  VHI. All rights reserved.   Knee-to-Chest Stretch: Bilateral    With hands behind knees, pull both knees in to chest until a comfortable stretch is felt in lower back and buttocks. Keep back relaxed. Hold _30_ seconds. Repeat _3_ times per set. Do _1_ sets per session. Do _1-2_ sessions per day.  http://orth.exer.us/128   Copyright  VHI. All rights reserved.    Lower Trunk Rotation Stretch    Keeping back flat and feet together, rotate knees to left side. Hold _30___ seconds. Repeat to other side and hold for 30 seconds. Repeat _3_ times each way.  Do _1_ sets per session. Do _1+-2_ sessions per day.  http://orth.exer.us/122   Copyright  VHI. All rights reserved.    Chair Sitting    Sit at edge of seat, spine straight, one leg extended. Put a hand on each thigh and bend forward from the hip, keeping spine straight. Allow hand on extended leg to reach toward toes. Support upper body with other arm. Hold _60_ seconds. Repeat _2-3_ times each leg. Do __1-2_ sessions per day.  Copyright  VHI. All rights reserved.

## 2017-01-30 NOTE — Therapy (Signed)
[facility] [address] [place], [place], [address] Phone: [phone]   Fax:  [phone]  Physical Therapy Treatment  Patient Details  Name: Jacob Joseph MRN: [id number] Date of Birth: May 15, 1956 Referring Provider: Burgess Estelle MD   Encounter Date: 01/29/2017      PT End of Session - 01/29/17 1411    Visit Number 9   Number of Visits 17   Date for PT Re-Evaluation 02/28/17  per updated POC   Authorization Type Medicare & G-codes every 10th visit    PT Start Time 1400   PT Stop Time 1446   PT Time Calculation (min) 46 min   Equipment Utilized During Treatment Gait belt   Activity Tolerance Patient tolerated treatment well   Behavior During Therapy [facility] for tasks assessed/performed      Past Medical History:  Diagnosis Date  . Calcium oxalate crystals in urine 12/23/2011   Asymptomatic, no hematuria. Advised to take plenty of water.  [name] Kitchen COPD (chronic obstructive pulmonary disease) ([place])   . Depression   . Diabetes mellitus without complication ([place]) 2992   pre diabetic  . Hepatitis C   . HIV (human immunodeficiency virus infection) ([place])   . Hypertension   . Peripheral arterial disease ([place])    ooccluded left SFA by duplex ultrasound, tibial disease left greater than right  . Stroke ([place]) 11/2011   Carotids Doppler negative. Right sided weakness resolved, initially on Plavix for 3 months and then continued with ASA.    . TB lung, latent 1988   Treated    Past Surgical History:  Procedure Laterality Date  . HERNIA REPAIR    . INGUINAL HERNIA REPAIR  01/16/2012   Procedure: HERNIA REPAIR INGUINAL INCARCERATED;  Surgeon: Zenovia Jarred, MD;  Location: [facility];  Service: General;  Laterality: Right;  . TEE WITHOUT CARDIOVERSION  12/07/2011   Procedure: TRANSESOPHAGEAL ECHOCARDIOGRAM (TEE);  Surgeon: Birdie Riddle, MD;  Location: [facility];  Service: Cardiovascular;  Laterality: N/A;    There were no  vitals filed for this visit.      Subjective Assessment - 01/29/17 1409    Subjective Reports two falls going to bathroom at night over the last month.  Not using cane in the house.    Pertinent History HIV, COPD, prostate cancer, HTN, diabetes, history of PE, cocaine and tobacco abuse, recurrent falls   Limitations Lifting;Standing;Walking;House hold activities   Patient Stated Goals reduce back pain; reduce falls    Currently in Pain? Yes   Pain Score 5    Pain Location Back   Pain Orientation Lower   Pain Descriptors / Indicators Aching   Pain Type Chronic pain   Pain Onset More than a month ago   Pain Frequency Constant   Aggravating Factors  increased activity and standing    Pain Relieving Factors lying down on back, stretches                         [facility] Adult PT Treatment/Exercise - 01/30/17 0001      Transfers   Transfers Sit to Stand   Sit to Stand 6: Modified independent (Device/Increase time)   Five time sit to stand comments  30.72 secs with hands on lap for support   Stand to Sit 6: Modified independent (Device/Increase time)     Ambulation/Gait   Ambulation/Gait Yes   Ambulation/Gait Assistance 5: Supervision   Ambulation/Gait Assistance Details with 6MWT (see section for  details).  Note that PT let him use rollator as wife and pt verbalized wanting a device for longer distances.  Felt that he looked much safer and more efficient with use of rollator.  Will plan to send note to MD to request order for rollator.  Both verbalized understanding.  Provided cues and demonstration on safety when sitting/standing as well as when turning to sit when fatigued, ensuring that brakes are locked prior to sitting.    Assistive device 4-wheeled walker   Gait Pattern Step-through pattern;Decreased stride length   Ambulation Surface Level;Indoor     Self-Care   Self-Care Other Self-Care Comments   Other Self-Care Comments  Wife questioning whether pt could get a  "chair."  Educated on coverage for w/c at this time and that due to pts mobility level, he would not likely qualify for a w/c.  Did discuss use of rollator to assist with endurance deficits.  Both verbalized understanding.  Also went over compliance with HEP.  Pt stating that he has not done them since being out of therapy.  Discussed that progress in therapy with mobility, balance and endurance would highly depend on compliance with HEP and walking program outside of therapy as therapy will eventually end.  Went over walking program with he and wife and re-printed HEP for them.                  PT Education - 01/29/17 1654    Education provided Yes   Education Details see self care   Person(s) Educated Patient;Spouse   Methods Explanation;Demonstration;Handout   Comprehension Verbalized understanding;Returned demonstration          PT Short Term Goals - 12/25/16 1327      PT SHORT TERM GOAL #1   Title Patient will verbalize understanding and return demonstration for initial HEP for LE strengthening and flexibility. (TARGET DATE: 12/25/2016)    Baseline 12/25/16: met at previous session per note   Status Achieved     PT SHORT TERM GOAL #2   Title PT will perform 5 time sit to stand test, and short term goal will be set. (TARGET DATE: 12/25/2016)   Baseline 12/25/16: this was performed with STG goal never set. retested today to check progress toward LTGs   Time --   Period --   Status Deferred     PT SHORT TERM GOAL #3   Title PT will perform 6 minute walk test, and short term goal will be set for 75 feet longer than distance achieved at baseline. (TARGET DATE: 12/25/2016)    Baseline 12/25/16: met today with increase of    Time --   Period --   Status Achieved     PT SHORT TERM GOAL #4   Title Patient's gait speed will be > or = 2.66f/s to indicate a significant change in his gait velocity relative to baseline and a decrease in his risk of falling. (TARGET DATE: 12/25/2016)     Baseline 12/25/16: progress has been made, however not to goal level   Time --   Period --   Status Partially Met     PT SHORT TERM GOAL #5   Title Patient will demonstrate ability to ambulate 200 feet with LRAD and modified independence without a seated rest break on level, indoor surfaces to indicate an increase in his endurance and decrease in his risk of falling. (TARGET DATE: 12/25/2016)    Baseline 12/25/16: met during 6 minute walk test   Time --  Period --   Status Achieved     PT SHORT TERM GOAL #6   Title Patient will score >/=17/24 on DGI to indicate a decrease in his risk of falling. (TARGET DATE: 12/25/2016)   Baseline 12/25/16: 19/24 scored   Time --   Period --   Status Achieved           PT Long Term Goals - 01/29/17 1411      PT LONG TERM GOAL #1   Title Patient will verbalize understanding and return demonstration for ongoing HEP for LE strengthening and flexibility. (Updated TARGET DATE: 02/28/2017)    Time 4   Period Weeks   Status On-going     PT LONG TERM GOAL #2   Title Patient will perform 5 time sit to stand test in < or = 15s to indicate an increase in functional LE strength.    Baseline 30.72 secs with BUE support on lap   Time 4   Period Weeks   Status Not Met     PT LONG TERM GOAL #3   Title PT will perform 6 Minute Walk Test, and long term goal will be set for 75' feet longer than the patient achieves at baseline.    Baseline 675' on 01/29/17   Time 4   Period Weeks   Status Revised     PT LONG TERM GOAL #4   Title Patient will demonstrate ability to ambulate 400 feet with LRAD and supervision on indoor/outdoor surfaces including ramps/curbs/stairs (1 rail) to demonstrate a decrease in his risk of falling when returning to community ambulation.    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   Time 4   Period Weeks   Status On-going               Plan - 01/30/17 03546   Clinical Impression Statement Skilled session focused on assessment of LTGs as pt has not been to therapy in approx one month.  Pt has made some progress towards LTGs and will re-cert for 4 more weeks, however discussed that pt will need to demonstrate improvement and compliance with HEP as he has not done this in the past.  Also recommend use of rollator for community use due to poor endurane.     Rehab Potential Good   PT Frequency 2x / week   PT Duration 8 weeks   PT Treatment/Interventions ADLs/Self Care Home Management;Neuromuscular re-education;Balance training;Therapeutic exercise;Therapeutic activities;Functional mobility training;Stair training;Gait training;DME Instruction;Patient/family education;Manual techniques   PT Next Visit Plan  Gcode and PN (do DGI) functional strength, balance and endurance (check HR)    Consulted and Agree with Plan of Care Patient      Patient will benefit from skilled therapeutic intervention in order to improve the following deficits and impairments:  Abnormal gait, Decreased activity tolerance, Decreased balance, Decreased coordination, Decreased safety awareness, Decreased range of motion, Decreased mobility, Decreased knowledge of use of DME, Decreased knowledge of precautions, Decreased endurance, Decreased strength, Difficulty walking, Postural dysfunction, Pain  Visit Diagnosis: Muscle weakness (generalized) - Plan: PT plan of  care cert/re-cert  Unsteadiness on feet - Plan: PT plan of care cert/re-cert  Other abnormalities of gait and mobility - Plan: PT plan of care cert/re-cert  Repeated falls - Plan: PT plan of care cert/re-cert     Problem List Patient Active Problem List   Diagnosis Date Noted  . Recurrent falls  11/02/2016  . AKI (acute kidney injury) ([place]) 09/24/2016  . History of pulmonary embolus (PE) 09/22/2016  . History of prostate cancer 09/22/2016  . BPH associated with nocturia 06/19/2016  . PE (pulmonary thromboembolism) ([place]) 05/12/2016  . Substance abuse 01/27/2016  . Vitamin D deficiency 12/05/2015  . Hyperlipidemia 01/28/2015  . Cataracts, bilateral 08/29/2014  . Diabetes mellitus type 2 with complications ([place]) 51/88/4166  . COPD, moderate ([place]) 05/31/2013  . Bilateral lower extremity pain 05/08/2013  . Complex Lt Renal Cyst 03/22/2013  . Preventive measure 05/30/2012  . Normocytic anemia 03/08/2012  . CKD Stage 2 03/08/2012  . ERECTILE DYSFUNCTION 04/25/2008  . Human immunodeficiency virus (HIV) disease ([place]) 04/27/2006  . Smoking greater than 30 pack years 04/27/2006  . Major depressive disorder, recurrent episode ([place]) 04/27/2006  . Essential hypertension 04/27/2006  . DEGENERATIVE JOINT DISEASE 04/27/2006  . Chronic Low Back Pain 04/27/2006    Cameron Sprang, PT, MPT [facility] [address] [place], [place], [address] Phone: [phone]   Fax:  [phone] 01/30/17, 8:24 AM  Name: Jacob Joseph MRN: [id number] Date of Birth: Aug 16, 1955

## 2017-02-01 ENCOUNTER — Encounter: Payer: Self-pay | Admitting: Internal Medicine

## 2017-02-01 ENCOUNTER — Ambulatory Visit (INDEPENDENT_AMBULATORY_CARE_PROVIDER_SITE_OTHER): Payer: Medicare Other | Admitting: Internal Medicine

## 2017-02-01 VITALS — BP 130/89 | HR 105 | Temp 98.9°F | Wt 222.9 lb

## 2017-02-01 DIAGNOSIS — E785 Hyperlipidemia, unspecified: Secondary | ICD-10-CM | POA: Diagnosis not present

## 2017-02-01 DIAGNOSIS — E118 Type 2 diabetes mellitus with unspecified complications: Secondary | ICD-10-CM | POA: Diagnosis not present

## 2017-02-01 DIAGNOSIS — Z7984 Long term (current) use of oral hypoglycemic drugs: Secondary | ICD-10-CM

## 2017-02-01 DIAGNOSIS — Z86718 Personal history of other venous thrombosis and embolism: Secondary | ICD-10-CM

## 2017-02-01 DIAGNOSIS — Z7901 Long term (current) use of anticoagulants: Secondary | ICD-10-CM

## 2017-02-01 DIAGNOSIS — E119 Type 2 diabetes mellitus without complications: Secondary | ICD-10-CM | POA: Diagnosis present

## 2017-02-01 DIAGNOSIS — Z86711 Personal history of pulmonary embolism: Secondary | ICD-10-CM

## 2017-02-01 DIAGNOSIS — F17210 Nicotine dependence, cigarettes, uncomplicated: Secondary | ICD-10-CM

## 2017-02-01 DIAGNOSIS — Z79899 Other long term (current) drug therapy: Secondary | ICD-10-CM

## 2017-02-01 LAB — GLUCOSE, CAPILLARY: GLUCOSE-CAPILLARY: 131 mg/dL — AB (ref 65–99)

## 2017-02-01 LAB — POCT GLYCOSYLATED HEMOGLOBIN (HGB A1C): HEMOGLOBIN A1C: 6.1

## 2017-02-01 NOTE — Progress Notes (Signed)
   CC: follow up of diabetes  HPI:  [name] is a 61 y.o. with PMH as listed below who presents for follow up of diabetes, hypertension, hyperlipidemia and history of DVTs. Please see the assessment and plans for the status of the patient chronic medical problems.   Past Medical History:  Diagnosis Date  . Calcium oxalate crystals in urine 12/23/2011   Asymptomatic, no hematuria. Advised to take plenty of water.  [name] Kitchen COPD (chronic obstructive pulmonary disease) ([place])   . Depression   . Diabetes mellitus without complication ([place]) 0932   pre diabetic  . Hepatitis C   . HIV (human immunodeficiency virus infection) ([place])   . Hypertension   . Peripheral arterial disease ([place])    ooccluded left SFA by duplex ultrasound, tibial disease left greater than right  . Stroke ([place]) 11/2011   Carotids Doppler negative. Right sided weakness resolved, initially on Plavix for 3 months and then continued with ASA.    . TB lung, latent 1988   Treated   Review of Systems:  Refer to history of present illness and assessment and plans for pertinent review of systems, all others reviewed and negative  Physical Exam:  Vitals:   02/01/17 1345  BP: 130/89  Pulse: (!) 105  Temp: 98.9 F (37.2 C)  TempSrc: Oral  SpO2: 99%  Weight: 222 lb 14.4 oz (101.1 kg)   Physical Exam  Constitutional: He is oriented to person, place, and time and well-developed, well-nourished, and in no distress. No distress.  HENT:  Head: Normocephalic and atraumatic.  Cardiovascular: Normal rate and regular rhythm.   No murmur heard. Pulmonary/Chest: Effort normal and breath sounds normal. He has no wheezes. He has no rales.  Abdominal: Soft. He exhibits no distension. There is no tenderness. There is no guarding.  Neurological: He is alert and oriented to person, place, and time.  Skin: Skin is warm and dry. He is not diaphoretic.  Malodorous white tissue between the toe interwebs. Dry skin on the feet with some  callous. No obvious injuries or ulcers     Assessment & Plan:   Diabetes mellitus  Last A1c 4/18 6.2. Patient reports good compliance with metformin he does not report GI upset. Denies symptoms of hypoglycemia.  He did not bring his meter today. Request metformin refill.  - Hemoglobin A1c today >> 6.1 - Refilled metformin 500 mg daily  - continue blood glucose monitoring  - suboptimal foot hygiene >> counseled on this - blood pressure well controlled today, will continue with lisinopril for proteinuria and BP control  - RTC in three months for repeat A1c   Hyperlipidemia  ID has requested a change in cholesterol lowering medication which will not interfere with his antiretroviral medications. Literature review shows that pravastatin will not interfere with cobicistat. He is on rosuvastatin 10 mg daily which is moderate intensity. Lipids were well controlled on last screen so I will transition to another moderate intensity regiment with pravastatin.  - Discontinue rosuvastatin  - prescribed pravastatin 40 mg daily   History of unprovoked PE Hospitalized 04/2016 for pulmonary embolism and had a history of  Right femoral, popliteal, posterior tibial, and peroneal vein DVT in 2014. Denies any bleeding related to [facility]. Good kidney function on last BMP 09/2016.  - refilled Savaysa  - consider kidney function labs at follow up    See Encounters Tab for problem based charting.  Patient discussed with Dr. Lynnae January

## 2017-02-03 ENCOUNTER — Ambulatory Visit: Payer: Medicare Other | Admitting: Physical Therapy

## 2017-02-03 MED ORDER — PRAVASTATIN SODIUM 40 MG PO TABS: 40.0000 mg | ORAL_TABLET | Freq: Every day | ORAL | 3 refills | Status: DC

## 2017-02-03 MED ORDER — EDOXABAN TOSYLATE 60 MG PO TABS: 60.0000 mg | ORAL_TABLET | Freq: Every day | ORAL | 4 refills | Status: DC

## 2017-02-03 MED ORDER — METFORMIN HCL 500 MG PO TABS: ORAL_TABLET | ORAL | 3 refills | Status: DC

## 2017-02-03 NOTE — Patient Instructions (Signed)
Patient left prior to the completion of this visit, an AVS was not printed

## 2017-02-04 ENCOUNTER — Other Ambulatory Visit: Payer: Self-pay | Admitting: Internal Medicine

## 2017-02-04 DIAGNOSIS — A6000 Herpesviral infection of urogenital system, unspecified: Secondary | ICD-10-CM

## 2017-02-04 LAB — MICROALBUMIN / CREATININE URINE RATIO
Creatinine, Urine: 18.8 mg/dL
Microalb/Creat Ratio: 25.5 mg/g creat (ref 0.0–30.0)
Microalbumin, Urine: 4.8 ug/mL

## 2017-02-04 NOTE — Progress Notes (Signed)
Internal Medicine Clinic Attending  Case discussed with Dr. Blum at the time of the visit.  We reviewed the resident's history and exam and pertinent patient test results.  I agree with the assessment, diagnosis, and plan of care documented in the resident's note. 

## 2017-02-05 ENCOUNTER — Ambulatory Visit: Payer: Medicare Other | Admitting: Rehabilitation

## 2017-02-09 ENCOUNTER — Telehealth: Payer: Self-pay | Admitting: Rehabilitation

## 2017-02-09 ENCOUNTER — Encounter: Payer: Self-pay | Admitting: Physical Therapy

## 2017-02-09 ENCOUNTER — Telehealth: Payer: Self-pay

## 2017-02-09 ENCOUNTER — Ambulatory Visit: Payer: Medicare Other | Admitting: Physical Therapy

## 2017-02-09 VITALS — HR 106

## 2017-02-09 DIAGNOSIS — M6281 Muscle weakness (generalized): Secondary | ICD-10-CM | POA: Diagnosis not present

## 2017-02-09 DIAGNOSIS — R2689 Other abnormalities of gait and mobility: Secondary | ICD-10-CM | POA: Diagnosis not present

## 2017-02-09 DIAGNOSIS — R2681 Unsteadiness on feet: Secondary | ICD-10-CM | POA: Diagnosis not present

## 2017-02-09 DIAGNOSIS — R296 Repeated falls: Secondary | ICD-10-CM

## 2017-02-09 NOTE — Telephone Encounter (Signed)
Jacob Joseph is a 61 y.o. male who was contacted for follow up on COPD and DOAC medication management.   COPD Follow Up: COPD medications:   - albuterol (ProAir HFA) 108 MCG   - tiotropium-olodaterol (Stiolto) 2.5-2.5 MCG   - Budesonide 90 MCG  - nicotine 7mg  patch  Pt reports adherence. Pt reports taking Stiolto 2 puffs once daily. Pt reports requiring rescue inhaler a couple times per day. Pt reports SOB when walking. Pt denies side effects from COPD medications. Pt denies any trouble getting medications refilled. Pt denies difficulty affording or picking up medications. Pt states that he is only smoking 1/2 cigarette per day, down from 1-2/day at last call. Pt reports success when using Nicotine 7mg  patch. Pt requested more samples of patches. Pt has not started using the Nicotine 2mg  lozenges.   DOAC Follow Up: Pt was contacted for monitoring of edoxaban [facility]) therapy.    Indication(s): hx unprovoked PE, CHA2DS2VASC score 5, HAS-BLED 3 Duration: indefinite Edoxaban (Savaysa) Dose: 60 mg daily Safety: Per last office visit (02/01/17), pt reports no bleeding or complications and denies signs or symptoms thromboembolism. Medication changes: no  Renal/hepatic/drug interaction concerns: no, reported normal in 02/01/17 note Adherence: Patient does correctly recite the dose. Patient reports no known adherence challenges.  Patient Instructions: Patient advised to contact clinic or seek medical attention if signs/symptoms of SOB, bleeding or thromboembolism occur. Patient verbalized understanding by repeating back information. Patient verbalized understanding.  Maryan Char, PharmD Candidate

## 2017-02-09 NOTE — Therapy (Addendum)
[facility] [address] [place], [place], [address] Phone: [phone]   Fax:  [phone]  Physical Therapy Treatment  Patient Details  Name: Jacob Joseph MRN: [id number] Date of Birth: 10/13/1955 Referring Provider: Burgess Estelle MD   Encounter Date: 02/09/2017      PT End of Session - 02/09/17 1505    Visit Number 10   Number of Visits 17   Date for PT Re-Evaluation 02/28/17   Authorization Type Medicare & G-codes every 10th visit    PT Start Time 1401   PT Stop Time 1446   PT Time Calculation (min) 45 min   Equipment Utilized During Treatment Gait belt   Activity Tolerance Patient tolerated treatment well   Behavior During Therapy [facility] for tasks assessed/performed      Past Medical History:  Diagnosis Date  . Calcium oxalate crystals in urine 12/23/2011   Asymptomatic, no hematuria. Advised to take plenty of water.  [name] Kitchen COPD (chronic obstructive pulmonary disease) ([place])   . Depression   . Diabetes mellitus without complication ([place]) 8811   pre diabetic  . Hepatitis C   . HIV (human immunodeficiency virus infection) ([place])   . Hypertension   . Peripheral arterial disease ()    ooccluded left SFA by duplex ultrasound, tibial disease left greater than right  . Stroke ([place]) 11/2011   Carotids Doppler negative. Right sided weakness resolved, initially on Plavix for 3 months and then continued with ASA.    . TB lung, latent 1988   Treated    Past Surgical History:  Procedure Laterality Date  . HERNIA REPAIR    . INGUINAL HERNIA REPAIR  01/16/2012   Procedure: HERNIA REPAIR INGUINAL INCARCERATED;  Surgeon: Zenovia Jarred, MD;  Location: [facility];  Service: General;  Laterality: Right;  . TEE WITHOUT CARDIOVERSION  12/07/2011   Procedure: TRANSESOPHAGEAL ECHOCARDIOGRAM (TEE);  Surgeon: Birdie Riddle, MD;  Location: [facility];  Service: Cardiovascular;  Laterality: N/A;    Vitals:   02/09/17 1356  02/09/17 1436 02/09/17 1437 02/09/17 1444  Pulse: (!) 111 (!) 101 (!) 111 (!) 106  SpO2: 97% 97% 97% 97%        Subjective Assessment - 02/09/17 1534    Subjective Pt. has fallen twice since last visit (01/29/17). He said he lost balance one time  and tripped over something another time. He said he is using cane outside. Reported to PT without cane today due to "rush to get out the door."   Pertinent History HIV, COPD, prostate cancer, HTN, diabetes, history of PE, cocaine and tobacco abuse, recurrent falls   Limitations Lifting;Standing;Walking;House hold activities   Pain Onset Other (comment)            [facility] PT Assessment - 02/09/17 1408      Dynamic Gait Index   Level Surface Mild Impairment   Change in Gait Speed Mild Impairment   Gait with Horizontal Head Turns Mild Impairment   Gait with Vertical Head Turns Mild Impairment   Gait and Pivot Turn Mild Impairment   Step Over Obstacle Mild Impairment   Step Around Obstacles Mild Impairment   Steps Mild Impairment   Total Score 16   DGI comment: 16/24=predictive of falls in older community liviing adults            [facility] Adult PT Treatment/Exercise - 02/09/17 1449      Neuro Re-ed    Neuro Re-ed Details  Compliant surfaces  Exercises   Exercises Other Exercises   Other Exercises  Single knee to chest, 30 seconds, 3 reps, R/L; Pt. reported back pain decreased from 8/10 to 6/10 following stretch    Neuro Re-ed:  Compliant surfaces:  Red mats placed in front of counter: --Performed high knee marches forward <>backward with 3 second hold at counter for support, 3 laps, unilateral UE support,  VC for posture and weight bearing, min guard.  Blue foam beam placed in corner with chair placed in front for support:  --EO, static, 30 seconds x3 progressing to head turns right<>left and up<>down x10 each, no UE support, VC for weight shifting, intermittent min assist with LOB;  --EC, static, 20 seconds x3, no UE  support, min guard to min assist, VC for weight shifting.        PT Education - 02/09/17 1448    Education provided Yes   Education Details Discussed high HR's seen througout treatment today and about talking to MD re: elevated HR; monitoring HR, BP with the unit he has at home.    Person(s) Educated Patient   Methods Explanation   Comprehension Verbalized understanding          PT Short Term Goals - 12/25/16 1327      PT SHORT TERM GOAL #1   Title Patient will verbalize understanding and return demonstration for initial HEP for LE strengthening and flexibility. (TARGET DATE: 12/25/2016)    Baseline 12/25/16: met at previous session per note   Status Achieved     PT SHORT TERM GOAL #2   Title PT will perform 5 time sit to stand test, and short term goal will be set. (TARGET DATE: 12/25/2016)   Baseline 12/25/16: this was performed with STG goal never set. retested today to check progress toward LTGs   Time --   Period --   Status Deferred     PT SHORT TERM GOAL #3   Title PT will perform 6 minute walk test, and short term goal will be set for 75 feet longer than distance achieved at baseline. (TARGET DATE: 12/25/2016)    Baseline 12/25/16: met today with increase of    Time --   Period --   Status Achieved     PT SHORT TERM GOAL #4   Title Patient's gait speed will be > or = 2.25f/s to indicate a significant change in his gait velocity relative to baseline and a decrease in his risk of falling. (TARGET DATE: 12/25/2016)    Baseline 12/25/16: progress has been made, however not to goal level   Time --   Period --   Status Partially Met     PT SHORT TERM GOAL #5   Title Patient will demonstrate ability to ambulate 200 feet with LRAD and modified independence without a seated rest break on level, indoor surfaces to indicate an increase in his endurance and decrease in his risk of falling. (TARGET DATE: 12/25/2016)    Baseline 12/25/16: met during 6 minute walk test   Time --   Period --    Status Achieved     PT SHORT TERM GOAL #6   Title Patient will score >/=17/24 on DGI to indicate a decrease in his risk of falling. (TARGET DATE: 12/25/2016)   Baseline 12/25/16: 19/24 scored   Time --   Period --   Status Achieved           PT Long Term Goals - 01/29/17 1411      PT LONG  TERM GOAL #1   Title Patient will verbalize understanding and return demonstration for ongoing HEP for LE strengthening and flexibility. (Updated TARGET DATE: 02/28/2017)    Time 4   Period Weeks   Status On-going     PT LONG TERM GOAL #2   Title Patient will perform 5 time sit to stand test in < or = 15s to indicate an increase in functional LE strength.    Baseline 30.72 secs with BUE support on lap   Time 4   Period Weeks   Status Not Met     PT LONG TERM GOAL #3   Title PT will perform 6 Minute Walk Test, and long term goal will be set for 75' feet longer than the patient achieves at baseline.    Baseline 675' on 01/29/17   Time 4   Period Weeks   Status Revised     PT LONG TERM GOAL #4   Title Patient will demonstrate ability to ambulate 400 feet with LRAD and supervision on indoor/outdoor surfaces including ramps/curbs/stairs (1 rail) to demonstrate a decrease in his risk of falling when returning to community ambulation.    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   Time 4   Period Weeks   Status On-going               Plan - 02/09/17 1518    Clinical Impression Statement Today's skilled session focused on assessing progress via DGI as well as targeting dynamic balance in standing and with gait. DGI score improved by one point (16/24) but pt. remains at high fall risk. Noted elevated HR  and SOB with all treatment activities, vital signs taken frequently throughout. HR ranged 101-111 bpm throughout session, frequent rest breaks were required. Discussed pt. reporting elevated HR to MD if continues to run high when measuring at home with his home unit. Pt. reported decreased low back pain after performing stretches. Pt. will benefit from continued skilled PT to progress towards unmet goals.   Rehab Potential Good   PT Frequency 2x / week   PT Duration 8 weeks   PT Treatment/Interventions ADLs/Self Care Home Management;Neuromuscular re-education;Balance training;Therapeutic exercise;Therapeutic activities;Functional mobility training;Stair training;Gait training;DME Instruction;Patient/family education;Manual techniques   PT Next Visit Plan Continue to target functional strength, balance and endurance (check HR)   Consulted and Agree with Plan of Care Patient      Patient will benefit from skilled therapeutic intervention in order to improve the following deficits and impairments:  Abnormal gait, Decreased activity tolerance, Decreased balance, Decreased coordination, Decreased safety awareness, Decreased range of motion, Decreased mobility, Decreased knowledge of use of DME, Decreased knowledge of precautions, Decreased endurance, Decreased strength, Difficulty walking, Postural dysfunction, Pain  Visit Diagnosis: Other abnormalities of gait and mobility  Repeated falls  Unsteadiness on feet  Muscle weakness (generalized)       G-Codes - [id number]    Functional Assessment Tool Used (Outpatient Only) 02/09/17: DGI score=16/24 (Scores of 19 or less are predictive of falls in older community living adults) as of 02/09/17   Functional Limitation  Mobility: Walking and moving around   Mobility: Walking and Moving Around Current Status [phone]) At least 20 percent but less than 40 percent impaired, limited or restricted   Mobility: Walking and Moving Around Goal Status [phone]) At  least 1 percent but less than 20 percent impaired, limited or restricted     Physical Therapy Progress Note  Dates of Reporting Period: 11/27/16 to 02/09/17  Objective Reports of Subjective Statement: See above  Objective Measurements: See above  Goal Update: See LTGs  Plan: Continue POC  Reason Skilled Services are Required: Pt continues to have high level balance, generalized weakness and endurance deficits that are being addressed.   Gcode and PN entered by:  Cameron Sprang, PT, MPT [facility] [address] [place], [place], [address] Phone: [phone]   Fax:  [phone] 02/11/17, 1:11 PM      Problem List Patient Active Problem List   Diagnosis Date Noted  . Recurrent falls 11/02/2016  . AKI (acute kidney injury) ([place]) 09/24/2016  . History of pulmonary embolus (PE) 09/22/2016  . History of prostate cancer 09/22/2016  . BPH associated with nocturia 06/19/2016  . PE (pulmonary thromboembolism) ([place]) 05/12/2016  . Substance abuse 01/27/2016  . Vitamin D deficiency 12/05/2015  . Hyperlipidemia 01/28/2015  . Cataracts, bilateral 08/29/2014  . Diabetes mellitus type 2 with complications ([place]) 27/74/1287  . COPD, moderate ([place]) 05/31/2013  . Bilateral lower extremity pain 05/08/2013  . Complex Lt Renal Cyst 03/22/2013  . Preventive measure 05/30/2012  . Normocytic anemia 03/08/2012  . CKD Stage 2 03/08/2012  . ERECTILE DYSFUNCTION 04/25/2008  . Human immunodeficiency virus (HIV) disease ([place]) 04/27/2006  . Smoking greater than 30 pack years 04/27/2006  . Major depressive disorder, recurrent episode ([place]) 04/27/2006  . Essential hypertension 04/27/2006  . DEGENERATIVE JOINT DISEASE 04/27/2006  . Chronic Low Back Pain 04/27/2006    Rulon Eisenmenger, SPTA 02/09/2017, 3:40 PM  [facility] [address] [place], [place], [address] Phone: [phone]   Fax:   [phone]  Name: Uday Jantz MRN: [id number] Date of Birth: Mar 08, 1956

## 2017-02-09 NOTE — Telephone Encounter (Signed)
Patient was contact with Maryan Char, PharmD candidate. I agree with the assessment and plan of care documented.

## 2017-02-09 NOTE — Telephone Encounter (Signed)
Dr. Orson Slick,   I am seeing [name] at OP neuro PT and feel that he would greatly benefit from the use of a rollator for community use due to poor endurance with elevated heart rate.  Please write order in Epic for rollator or fax order to [phone].    Thanks,  Cameron Sprang, PT, MPT [facility] [address] [place], [place], [address] Phone: [phone]   Fax:  [phone] 02/09/17, 11:15 AM

## 2017-02-10 ENCOUNTER — Other Ambulatory Visit: Payer: Self-pay | Admitting: Internal Medicine

## 2017-02-10 DIAGNOSIS — B20 Human immunodeficiency virus [HIV] disease: Secondary | ICD-10-CM

## 2017-02-10 MED ORDER — ROLLATOR MISC: 0 refills | Status: DC

## 2017-02-12 ENCOUNTER — Ambulatory Visit: Payer: Medicare Other | Admitting: Rehabilitation

## 2017-02-12 ENCOUNTER — Telehealth: Payer: Self-pay | Admitting: Internal Medicine

## 2017-02-12 ENCOUNTER — Encounter: Payer: Self-pay | Admitting: Rehabilitation

## 2017-02-12 DIAGNOSIS — M6281 Muscle weakness (generalized): Secondary | ICD-10-CM

## 2017-02-12 DIAGNOSIS — R2689 Other abnormalities of gait and mobility: Secondary | ICD-10-CM

## 2017-02-12 DIAGNOSIS — R2681 Unsteadiness on feet: Secondary | ICD-10-CM | POA: Diagnosis not present

## 2017-02-12 DIAGNOSIS — R296 Repeated falls: Secondary | ICD-10-CM

## 2017-02-12 DIAGNOSIS — W19XXXS Unspecified fall, sequela: Secondary | ICD-10-CM

## 2017-02-12 NOTE — Patient Instructions (Signed)
Feet Apart (Compliant Surface) Varied Arm Positions - Eyes Closed    Stand on compliant surface: __pillow or cushion ______ with feet shoulder width apart and arms out. Close eyes and visualize upright position. Hold__20__ seconds. Repeat __3__ times per session. Do __1-2__ sessions per day.  Copyright  VHI. All rights reserved.   Feet Together (Compliant Surface) Head Motion - Eyes Open    With eyes open, standing on compliant surface: __pillow______, feet together, move head slowly: up and down x 10 reps, side to side x 10 reps  Repeat _1___ times per session. Do _2___ sessions per day.  Copyright  VHI. All rights reserved.   Feet Together (Compliant Surface) Varied Arm Positions - Eyes Closed    Stand on compliant surface: ___pillow_____ with feet together and arms out. Close eyes and visualize upright position. Hold_20__ seconds. Repeat __3__ times per session. Do __1-2__ sessions per day.  Copyright  VHI. All rights reserved.

## 2017-02-12 NOTE — Telephone Encounter (Signed)
Rec'd call from [facility] on [address] requesting a DME ORDER be placed for a Teaching laboratory technician.  They are unable to see your initial order and would like for you to place another one under OTHER ORDERS in EPIC and they will order this item.

## 2017-02-12 NOTE — Therapy (Signed)
[facility] [address] [place], [place], [address] Phone: [phone]   Fax:  [phone]  Physical Therapy Treatment  Patient Details  Name: Jonny Dearden MRN: [id number] Date of Birth: 05-22-1956 Referring Provider: Burgess Estelle MD   Encounter Date: 02/12/2017      PT End of Session - 02/12/17 1757    Visit Number 11   Number of Visits 17   Date for PT Re-Evaluation 02/28/17   Authorization Type Medicare & G-codes every 10th visit    PT Start Time 1403   PT Stop Time 1446   PT Time Calculation (min) 43 min   Equipment Utilized During Treatment Gait belt   Activity Tolerance Patient tolerated treatment well   Behavior During Therapy [facility] for tasks assessed/performed      Past Medical History:  Diagnosis Date  . Calcium oxalate crystals in urine 12/23/2011   Asymptomatic, no hematuria. Advised to take plenty of water.  [name] Kitchen COPD (chronic obstructive pulmonary disease) ([place])   . Depression   . Diabetes mellitus without complication ([place]) 6237   pre diabetic  . Hepatitis C   . HIV (human immunodeficiency virus infection) ([place])   . Hypertension   . Peripheral arterial disease ([place])    ooccluded left SFA by duplex ultrasound, tibial disease left greater than right  . Stroke ([place]) 11/2011   Carotids Doppler negative. Right sided weakness resolved, initially on Plavix for 3 months and then continued with ASA.    . TB lung, latent 1988   Treated    Past Surgical History:  Procedure Laterality Date  . HERNIA REPAIR    . INGUINAL HERNIA REPAIR  01/16/2012   Procedure: HERNIA REPAIR INGUINAL INCARCERATED;  Surgeon: Zenovia Jarred, MD;  Location: [place];  Service: General;  Laterality: Right;  . TEE WITHOUT CARDIOVERSION  12/07/2011   Procedure: TRANSESOPHAGEAL ECHOCARDIOGRAM (TEE);  Surgeon: Birdie Riddle, MD;  Location: [place];  Service: Cardiovascular;  Laterality: N/A;    There were no vitals filed for  this visit.      Subjective Assessment - 02/12/17 1407    Subjective Pt reports single fall since last visit, going to the restroom.  Did not use cane and did not have light on.     Pertinent History HIV, COPD, prostate cancer, HTN, diabetes, history of PE, cocaine and tobacco abuse, recurrent falls   Limitations Lifting;Standing;Walking;House hold activities   Patient Stated Goals reduce back pain; reduce falls    Currently in Pain? Yes   Pain Score 7    Pain Location Back   Pain Orientation Lower   Pain Descriptors / Indicators Aching   Pain Type Chronic pain   Pain Onset Other (comment)   Pain Frequency Constant   Aggravating Factors  activity   Pain Relieving Factors lying down                          [facility] Adult PT Treatment/Exercise - 02/12/17 0001      Self-Care   Self-Care Other Self-Care Comments   Other Self-Care Comments  Extensive education regarding reducing fall risk at home when getting up at night.  Pt reports he "doesn't have room for cane" by his bed.  Educated to prop against wall and grab once he gets up.  Also note he is getting up in the dark without any light.  Highly recommend he turn bathroom light on and leave door cracked so that he  can see a path to get to the bathroom at night.  Pt verbalized understanding.       Neuro Re-ed    Neuro Re-ed Details  Performed corner balance tasks to provide HEP for this at home. Worked on compliant surfaces with feet apart>together>EO>EC.  See pt instruction for details on exercises added.  Performed counter top balance with tandem walking with single UE support forwards/backwards x 2 reps with cues for correct foot placement. Progressed to marching forward slowly to increase time spent in SLS.  Cues for slower speed, performed x 2 laps down and backwards.       Exercises   Exercises Other Exercises   Other Exercises  single knee to chest x 1 min on each side, posterior pelvic tilt x 10 reps with 5 sec  holds, lower trunk rotation x 10 reps to reduce pain prior to beginning balance exercises. Seated nustep x 5 mins at level 4 resistance with BUEs/LEs.  HR to 118 following task, tolerated well.  Side stepping along counter top with squats x 2 reps down and back with cues and demo for correct technique.                  PT Education - 02/12/17 1756    Education provided Yes   Education Details See self care, HEP for balance   Person(s) Educated Patient   Methods Explanation;Demonstration   Comprehension Verbalized understanding;Returned demonstration          PT Short Term Goals - 12/25/16 1327      PT SHORT TERM GOAL #1   Title Patient will verbalize understanding and return demonstration for initial HEP for LE strengthening and flexibility. (TARGET DATE: 12/25/2016)    Baseline 12/25/16: met at previous session per note   Status Achieved     PT SHORT TERM GOAL #2   Title PT will perform 5 time sit to stand test, and short term goal will be set. (TARGET DATE: 12/25/2016)   Baseline 12/25/16: this was performed with STG goal never set. retested today to check progress toward LTGs   Time --   Period --   Status Deferred     PT SHORT TERM GOAL #3   Title PT will perform 6 minute walk test, and short term goal will be set for 75 feet longer than distance achieved at baseline. (TARGET DATE: 12/25/2016)    Baseline 12/25/16: met today with increase of    Time --   Period --   Status Achieved     PT SHORT TERM GOAL #4   Title Patient's gait speed will be > or = 2.4f/s to indicate a significant change in his gait velocity relative to baseline and a decrease in his risk of falling. (TARGET DATE: 12/25/2016)    Baseline 12/25/16: progress has been made, however not to goal level   Time --   Period --   Status Partially Met     PT SHORT TERM GOAL #5   Title Patient will demonstrate ability to ambulate 200 feet with LRAD and modified independence without a seated rest break on level, indoor  surfaces to indicate an increase in his endurance and decrease in his risk of falling. (TARGET DATE: 12/25/2016)    Baseline 12/25/16: met during 6 minute walk test   Time --   Period --   Status Achieved     PT SHORT TERM GOAL #6   Title Patient will score >/=17/24 on DGI to indicate a decrease  in his risk of falling. (TARGET DATE: 12/25/2016)   Baseline 12/25/16: 19/24 scored   Time --   Period --   Status Achieved           PT Long Term Goals - 01/29/17 1411      PT LONG TERM GOAL #1   Title Patient will verbalize understanding and return demonstration for ongoing HEP for LE strengthening and flexibility. (Updated TARGET DATE: 02/28/2017)    Time 4   Period Weeks   Status On-going     PT LONG TERM GOAL #2   Title Patient will perform 5 time sit to stand test in < or = 15s to indicate an increase in functional LE strength.    Baseline 30.72 secs with BUE support on lap   Time 4   Period Weeks   Status Not Met     PT LONG TERM GOAL #3   Title PT will perform 6 Minute Walk Test, and long term goal will be set for 75' feet longer than the patient achieves at baseline.    Baseline 675' on 01/29/17   Time 4   Period Weeks   Status Revised     PT LONG TERM GOAL #4   Title Patient will demonstrate ability to ambulate 400 feet with LRAD and supervision on indoor/outdoor surfaces including ramps/curbs/stairs (1 rail) to demonstrate a decrease in his risk of falling when returning to community ambulation.    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   Time 4   Period Weeks   Status On-going               Plan - 02/12/17 1759    Clinical Impression Statement Skilled session focused on  stretching for low back pain, balance with emphasis on compliant surfaces and eyes closed as well as therex to improve overall strength and endurance.      Rehab Potential Good   PT Frequency 2x / week   PT Duration 8 weeks   PT Treatment/Interventions ADLs/Self Care Home Management;Neuromuscular re-education;Balance training;Therapeutic exercise;Therapeutic activities;Functional mobility training;Stair training;Gait training;DME Instruction;Patient/family education;Manual techniques   PT Next Visit Plan Give order for rollator at next viist, Continue to target functional strength, balance and endurance (check HR)   Consulted and Agree with Plan of Care Patient      Patient will benefit from skilled therapeutic intervention in order to improve the following deficits and impairments:  Abnormal gait, Decreased activity tolerance, Decreased balance, Decreased coordination, Decreased safety awareness, Decreased range of motion, Decreased mobility, Decreased knowledge of use of DME, Decreased knowledge of precautions, Decreased endurance, Decreased strength, Difficulty walking, Postural dysfunction, Pain  Visit Diagnosis: Other abnormalities of gait and mobility  Repeated falls  Unsteadiness on feet  Muscle weakness (generalized)       G-Codes - [id number]    Functional Assessment Tool Used (Outpatient Only) 02/09/17: DGI score=16/24 (Scores of 19 or less are predictive of falls in older community living adults) as of 02/09/17   Functional Limitation Mobility: Walking and moving around   Mobility: Walking and Moving Around  Current Status [phone]) At least 20 percent but less than 40 percent impaired, limited or restricted   Mobility: Walking and Moving Around Goal Status [phone]) At least 1 percent but less than 20 percent impaired, limited or restricted      Problem List Patient Active Problem List   Diagnosis Date Noted  . Recurrent falls 11/02/2016  . AKI (acute kidney injury) ([place])  09/24/2016  . History of pulmonary embolus (PE) 09/22/2016  . History of prostate cancer 09/22/2016  . BPH associated with nocturia 06/19/2016  . PE (pulmonary thromboembolism) ([place]) 05/12/2016  . Substance abuse 01/27/2016  . Vitamin D deficiency 12/05/2015  . Hyperlipidemia 01/28/2015  . Cataracts, bilateral 08/29/2014  . Diabetes mellitus type 2 with complications ([place]) 78/58/8502  . COPD, moderate ([place]) 05/31/2013  . Bilateral lower extremity pain 05/08/2013  . Complex Lt Renal Cyst 03/22/2013  . Preventive measure 05/30/2012  . Normocytic anemia 03/08/2012  . CKD Stage 2 03/08/2012  . ERECTILE DYSFUNCTION 04/25/2008  . Human immunodeficiency virus (HIV) disease ([place]) 04/27/2006  . Smoking greater than 30 pack years 04/27/2006  . Major depressive disorder, recurrent episode ([place]) 04/27/2006  . Essential hypertension 04/27/2006  . DEGENERATIVE JOINT DISEASE 04/27/2006  . Chronic Low Back Pain 04/27/2006    Cameron Sprang, PT, MPT [facility] [address] [place], [place], [address] Phone: [phone]   Fax:  [phone] 02/12/17, 6:01 PM  Name: Pj Zehner MRN: [id number] Date of Birth: 1956/04/09

## 2017-02-15 NOTE — Telephone Encounter (Signed)
Placed another order for rollator as requested

## 2017-02-16 ENCOUNTER — Ambulatory Visit: Payer: Medicare Other | Admitting: Physical Therapy

## 2017-02-19 ENCOUNTER — Ambulatory Visit: Payer: Medicare Other | Admitting: Physical Therapy

## 2017-02-22 ENCOUNTER — Ambulatory Visit: Payer: Medicare Other | Admitting: Rehabilitation

## 2017-02-22 ENCOUNTER — Ambulatory Visit: Payer: Medicare Other | Admitting: Internal Medicine

## 2017-02-23 ENCOUNTER — Ambulatory Visit: Payer: Medicare Other | Admitting: Internal Medicine

## 2017-02-24 ENCOUNTER — Ambulatory Visit: Payer: Medicare Other | Admitting: Podiatry

## 2017-02-25 ENCOUNTER — Other Ambulatory Visit: Payer: Self-pay | Admitting: Internal Medicine

## 2017-02-25 ENCOUNTER — Ambulatory Visit: Payer: Medicare Other | Attending: Internal Medicine | Admitting: Physical Therapy

## 2017-02-25 DIAGNOSIS — R2681 Unsteadiness on feet: Secondary | ICD-10-CM | POA: Insufficient documentation

## 2017-02-25 DIAGNOSIS — R296 Repeated falls: Secondary | ICD-10-CM | POA: Insufficient documentation

## 2017-02-25 DIAGNOSIS — M6281 Muscle weakness (generalized): Secondary | ICD-10-CM | POA: Insufficient documentation

## 2017-02-25 DIAGNOSIS — R2689 Other abnormalities of gait and mobility: Secondary | ICD-10-CM | POA: Insufficient documentation

## 2017-02-27 ENCOUNTER — Other Ambulatory Visit: Payer: Self-pay | Admitting: Internal Medicine

## 2017-03-01 ENCOUNTER — Ambulatory Visit: Payer: Medicare Other | Admitting: Rehabilitation

## 2017-03-01 ENCOUNTER — Encounter: Payer: Self-pay | Admitting: Rehabilitation

## 2017-03-01 ENCOUNTER — Other Ambulatory Visit: Payer: Self-pay | Admitting: Pharmacist

## 2017-03-01 DIAGNOSIS — R2681 Unsteadiness on feet: Secondary | ICD-10-CM | POA: Diagnosis not present

## 2017-03-01 DIAGNOSIS — R2689 Other abnormalities of gait and mobility: Secondary | ICD-10-CM

## 2017-03-01 DIAGNOSIS — R296 Repeated falls: Secondary | ICD-10-CM

## 2017-03-01 DIAGNOSIS — M6281 Muscle weakness (generalized): Secondary | ICD-10-CM

## 2017-03-01 DIAGNOSIS — M1990 Unspecified osteoarthritis, unspecified site: Secondary | ICD-10-CM

## 2017-03-01 MED ORDER — DICLOFENAC SODIUM 1 % TD GEL: 4.0000 g | Freq: Four times a day (QID) | TRANSDERMAL | 6 refills | Status: DC | PRN

## 2017-03-01 NOTE — Progress Notes (Signed)
Patient called requesting a refill on diclofenac gel and an order for nebulizer machine. Requests were sent for both. Otherwise, patient states he is doing well with no symptoms of concern at this time. Plans to schedule a PCP appointment for October. Patient was advised to contact clinic if concerns arise, patient verbalized understanding.

## 2017-03-01 NOTE — Therapy (Signed)
[facility] [address] [place], [place], [address] Phone: [phone]   Fax:  [phone]  Physical Therapy Treatment  Patient Details  Name: Jacob Joseph MRN: [id number] Date of Birth: [birth year] Referring Provider: Burgess Estelle MD   Encounter Date: 03/01/2017      PT End of Session - 03/01/17 1328    Visit Number 12   Number of Visits 17   Date for PT Re-Evaluation 03/08/17  per updated POC   Authorization Type Medicare & G-codes every 10th visit    PT Start Time 6553   PT Stop Time 1400   PT Time Calculation (min) 43 min   Equipment Utilized During Treatment Gait belt   Activity Tolerance Patient tolerated treatment well   Behavior During Therapy [facility] for tasks assessed/performed      Past Medical History:  Diagnosis Date  . Calcium oxalate crystals in urine 12/23/2011   Asymptomatic, no hematuria. Advised to take plenty of water.  [name] Kitchen COPD (chronic obstructive pulmonary disease) ([place])   . Depression   . Diabetes mellitus without complication ([place]) 7482   pre diabetic  . Hepatitis C   . HIV (human immunodeficiency virus infection) ([place])   . Hypertension   . Peripheral arterial disease ([place])    ooccluded left SFA by duplex ultrasound, tibial disease left greater than right  . Stroke ([place]) 11/2011   Carotids Doppler negative. Right sided weakness resolved, initially on Plavix for 3 months and then continued with ASA.    . TB lung, latent 1988   Treated    Past Surgical History:  Procedure Laterality Date  . HERNIA REPAIR    . INGUINAL HERNIA REPAIR  01/16/2012   Procedure: HERNIA REPAIR INGUINAL INCARCERATED;  Surgeon: Zenovia Jarred, MD;  Location: [place];  Service: General;  Laterality: Right;  . TEE WITHOUT CARDIOVERSION  12/07/2011   Procedure: TRANSESOPHAGEAL ECHOCARDIOGRAM (TEE);  Surgeon: Birdie Riddle, MD;  Location: [place];  Service: Cardiovascular;  Laterality: N/A;    There were no  vitals filed for this visit.      Subjective Assessment - 03/01/17 1320    Subjective Reports single fall since last visit, still getting up at night, but is using the cane now.    Pertinent History HIV, COPD, prostate cancer, HTN, diabetes, history of PE, cocaine and tobacco abuse, recurrent falls   Limitations Lifting;Standing;Walking;House hold activities   Patient Stated Goals reduce back pain; reduce falls    Currently in Pain? Yes   Pain Score 7    Pain Location Back   Pain Orientation Lower   Pain Descriptors / Indicators Aching   Pain Type Chronic pain   Pain Onset More than a month ago   Pain Frequency Constant   Aggravating Factors  activity   Pain Relieving Factors lying down             [facility] PT Assessment - 03/01/17 0001      6 Minute Walk- Baseline   6 Minute Walk- Baseline yes   BP (mmHg) 107/77   HR (bpm) 103   02 Sat (%RA) 98 %   Modified Borg Scale for Dyspnea 0- Nothing at all   Perceived Rate of Exertion (Borg) 9- very light     6 Minute walk- Post Test   6 Minute Walk Post Test yes   BP (mmHg) 143/86   HR (bpm) 115   02 Sat (%RA) 99 %   Modified Borg Scale for Dyspnea 8-  Perceived Rate of Exertion (Borg) 15- Hard     6 minute walk test results    Aerobic Endurance Distance Walked 822   Endurance additional comments with rollator and 1 seated rest break                      [facility] Adult PT Treatment/Exercise - 03/01/17 0001      Transfers   Five time sit to stand comments  18.44 secs with hands on lap      Ambulation/Gait   Ambulation/Gait Yes   Ambulation/Gait Assistance 5: Supervision;6: Modified independent (Device/Increase time)   Ambulation/Gait Assistance Details Pt requires cues for safety when turning to sit on rollator during 6MWT   Ambulation Distance (Feet) 822 Feet   Assistive device 4-wheeled walker   Gait Pattern Step-through pattern;Decreased stride length   Ambulation Surface Level;Indoor   Gait velocity  2.60 ft/sec with rollator                PT Education - 03/01/17 1320    Education provided Yes   Education Details education on D/C, rollator use and continued community fitness   Person(s) Educated Patient   Methods Explanation   Comprehension Verbalized understanding          PT Short Term Goals - 12/25/16 1327      PT SHORT TERM GOAL #1   Title Patient will verbalize understanding and return demonstration for initial HEP for LE strengthening and flexibility. (TARGET DATE: 12/25/2016)    Baseline 12/25/16: met at previous session per note   Status Achieved     PT SHORT TERM GOAL #2   Title PT will perform 5 time sit to stand test, and short term goal will be set. (TARGET DATE: 12/25/2016)   Baseline 12/25/16: this was performed with STG goal never set. retested today to check progress toward LTGs   Time --   Period --   Status Deferred     PT SHORT TERM GOAL #3   Title PT will perform 6 minute walk test, and short term goal will be set for 75 feet longer than distance achieved at baseline. (TARGET DATE: 12/25/2016)    Baseline 12/25/16: met today with increase of    Time --   Period --   Status Achieved     PT SHORT TERM GOAL #4   Title Patient's gait speed will be > or = 2.33f/s to indicate a significant change in his gait velocity relative to baseline and a decrease in his risk of falling. (TARGET DATE: 12/25/2016)    Baseline 12/25/16: progress has been made, however not to goal level   Time --   Period --   Status Partially Met     PT SHORT TERM GOAL #5   Title Patient will demonstrate ability to ambulate 200 feet with LRAD and modified independence without a seated rest break on level, indoor surfaces to indicate an increase in his endurance and decrease in his risk of falling. (TARGET DATE: 12/25/2016)    Baseline 12/25/16: met during 6 minute walk test   Time --   Period --   Status Achieved     PT SHORT TERM GOAL #6   Title Patient will score >/=17/24 on DGI to  indicate a decrease in his risk of falling. (TARGET DATE: 12/25/2016)   Baseline 12/25/16: 19/24 scored   Time --   Period --   Status Achieved  PT Long Term Goals - 03/01/17 1335      PT LONG TERM GOAL #1   Title Patient will verbalize understanding and return demonstration for ongoing HEP for LE strengthening and flexibility. (Updated TARGET DATE: 02/28/2017)    Time 4   Period Weeks   Status On-going     PT LONG TERM GOAL #2   Title Patient will perform 5 time sit to stand test in < or = 15s to indicate an increase in functional LE strength.    Baseline 30.72 secs with BUE support on lap, 18.44 secs with hands on lap   Time 4   Period Weeks   Status Achieved     PT LONG TERM GOAL #3   Title PT will perform 6 Minute Walk Test, and long term goal will be set for 75' feet longer than the patient achieves at baseline.    Baseline 675' on 01/29/17, 822' on 03/01/17   Time 4   Period Weeks   Status Achieved     PT LONG TERM GOAL #4   Title Patient will demonstrate ability to ambulate 400 feet with LRAD and supervision on indoor/outdoor surfaces including ramps/curbs/stairs (1 rail) to demonstrate a decrease in his risk of falling when returning to community ambulation.    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   Time 4   Period Weeks   Status On-going               Plan - 03/01/17 2035    Clinical Impression Statement Skilled session focused on beginning to check LTGs for planned D/C on Friday.  Note that pt has missed several visits and feel that he has made progress but has not shown compliant and commitment to therapy at this time.   Provided pt with order for rollator and [facility].  Pt verbalized understanding.  Pt has met 2/6 LTGs and partially met gait speed goal.  Will recert for this session and one more to wrap up LTGs and D/C.    Rehab Potential Good   PT Frequency 2x / week   PT Duration 8 weeks   PT Treatment/Interventions ADLs/Self Care Home Management;Neuromuscular re-education;Balance training;Therapeutic exercise;Therapeutic activities;Functional mobility training;Stair training;Gait training;DME Instruction;Patient/family education;Manual techniques   PT Next Visit Plan check remaining LTGs and DC   Consulted and Agree with Plan of Care Patient      Patient will benefit from skilled therapeutic intervention in order to improve the following deficits and impairments:  Abnormal gait, Decreased activity tolerance, Decreased balance, Decreased coordination, Decreased safety awareness, Decreased range of motion, Decreased mobility, Decreased knowledge of use of DME, Decreased knowledge of precautions, Decreased endurance, Decreased strength, Difficulty walking, Postural dysfunction, Pain  Visit Diagnosis: Other abnormalities of gait and mobility - Plan: PT plan of care cert/re-cert  Repeated falls - Plan: PT plan of care cert/re-cert  Unsteadiness on feet - Plan: PT plan of care cert/re-cert  Muscle weakness (generalized) - Plan: PT plan of care cert/re-cert  Problem List Patient Active Problem List   Diagnosis Date Noted  . Recurrent falls 11/02/2016  . AKI (acute kidney injury) ([place]) 09/24/2016  . History of pulmonary embolus (PE) 09/22/2016  . History of prostate cancer 09/22/2016  . BPH associated with nocturia 06/19/2016  . PE (pulmonary thromboembolism) ([name]) 05/12/2016  . Substance abuse 01/27/2016  . Vitamin D deficiency 12/05/2015  . Hyperlipidemia 01/28/2015  . Cataracts, bilateral 08/29/2014  . Diabetes mellitus type 2 with complications  ([place]) 25/63/8937  . COPD, moderate ([place]) 05/31/2013  . Bilateral lower extremity pain 05/08/2013  . Complex Lt Renal Cyst 03/22/2013  . Preventive measure 05/30/2012  . Normocytic anemia 03/08/2012  . CKD Stage 2 03/08/2012  . ERECTILE DYSFUNCTION 04/25/2008  . Human immunodeficiency virus (HIV) disease ([place]) 04/27/2006  . Smoking greater than 30 pack years 04/27/2006  . Major depressive disorder, recurrent episode ([place]) 04/27/2006  . Essential hypertension 04/27/2006  . DEGENERATIVE JOINT DISEASE 04/27/2006  . Chronic Low Back Pain 04/27/2006    Cameron Sprang, PT, MPT [facility] [address] [place], [place], [address] Phone: [phone]   Fax:  [phone] 03/01/17, 8:48 PM  Name: Jacob Joseph MRN: [id number] Date of Birth: Jan 17, 1956

## 2017-03-01 NOTE — Telephone Encounter (Signed)
Thank you, I refilled the voltaren

## 2017-03-02 NOTE — Addendum Note (Signed)
Addended by: Forde Dandy on: 03/02/2017 02:23 PM   Modules accepted: Orders

## 2017-03-03 ENCOUNTER — Other Ambulatory Visit: Payer: Self-pay | Admitting: Pharmacist

## 2017-03-03 DIAGNOSIS — J449 Chronic obstructive pulmonary disease, unspecified: Secondary | ICD-10-CM

## 2017-03-05 ENCOUNTER — Encounter: Payer: Self-pay | Admitting: Rehabilitation

## 2017-03-05 ENCOUNTER — Ambulatory Visit: Payer: Medicare Other | Admitting: Rehabilitation

## 2017-03-05 DIAGNOSIS — M6281 Muscle weakness (generalized): Secondary | ICD-10-CM

## 2017-03-05 DIAGNOSIS — R296 Repeated falls: Secondary | ICD-10-CM | POA: Diagnosis not present

## 2017-03-05 DIAGNOSIS — R2689 Other abnormalities of gait and mobility: Secondary | ICD-10-CM | POA: Diagnosis not present

## 2017-03-05 DIAGNOSIS — R2681 Unsteadiness on feet: Secondary | ICD-10-CM

## 2017-03-05 NOTE — Patient Instructions (Signed)
Functional Quadriceps: Sit to Stand    Sit on edge of chair, feet flat on floor. Stand upright, extending knees fully.  Do this without hands!! Move to a lower or softer surface when able.   Repeat __10__ times per set. Do __1__ sets per session. Do __1-2__ sessions per day.  http://orth.exer.us/734  Copyright  VHI. All rights reserved.   WALKING  Walking is a great form of exercise to increase your strength, endurance and overall fitness. A walking program can help you start slowly and gradually build endurance as you go. Everyone's ability is different, so each person's starting point will be different. You do not have to follow them exactly. The are just samples. You should simply find out what's right for you and stick to that program.  In the beginning, you'll start off walking 2-3 times a day for short distances. As you get stronger, you'll be walking further at just 1-2 times per day.  A.You Can Walk For A Certain Length Of Time Each Day  Walk 4 minutes 2-3 times per day. Increase 1 minutes every 7 days (3 times per day). Work up to 25-30 minutes (1-2 times per day).  Example: Day 1-24 minutes3 times per day Day 7-85 minutes2-3 times per day Day 13-146 minutes1-2 times per day  B.You Can Walk For a Certain Distance Each Day  Distance can be substituted for time.  Example: 3 trips to mailbox (at road) 3 trips to corner of block 3 trips around the block   Please only do the exercises that your therapist has initialed and dated   http://gtsc.exer.us/110  Copyright  VHI. All rights reserved.  Mini Squat: Double Leg    With feet shoulder width apart, reach  forward for balance and do a mini squat. Keep knees in line with second toe. Knees do not go past toes. Do this in front of chair or couch so that you can aim bottom towards couch/chair.  Repeat _10__ times per set. Rest _60-90__ seconds after set. Do _1__ sets per session.  http://plyo.exer.us/70  Copyright  VHI. All rights reserved.  Knee to Chest (Flexion)    Pull knee toward chest. Feel stretch in lower back or buttock area. Breathing deeply, Hold __30_ seconds. Repeat with other knee. Repeat _2-3__ times each leg. Do __1-2__ sessions per day.  http://gt2.exer.us/225  Copyright  VHI. All rights reserved.    http://orth.exer.us/128  Copyright  VHI. All rights reserved.   Lower Trunk Rotation Stretch    Keeping back flat and feet together, rotate knees to left side. Hold _30___ seconds. Repeat to other side and hold for 30 seconds. Repeat _3_ times each way. Do _1_ sets per session. Do _1+-2_ sessions per day.  http://orth.exer.us/122  Copyright  VHI. All rights reserved.   Chair Sitting    Sit at edge of seat, spine straight, one leg extended. Put a hand on each thigh and bend forward from the hip, keeping spine straight. Allow hand on extended leg to reach toward toes. Support upper body with other arm. Hold _60_ seconds. Repeat _2-3_ times each leg. Do __1-2_ sessions per day.  Copyright  VHI. All rights reserved  Band Walk: Side Stepping    Tie band around legs, just above knees or around ankles. Step to one side, keep going along the counter top.  Make sure your feet stay pointed forward, not out to the side.  STAND TALL!!  Note: Small towel between band and skin eases rubbing.  http://plyo.exer.us/76   Copyright  VHI. All rights reserved.      Stand at counter top with one hand holding on for support.  Place band around the front of both feet. March one leg up x 10 reps and then repeat on the other side.  Stand tall!! Use  less support as you can.

## 2017-03-06 NOTE — Therapy (Signed)
[facility] [address] [place], [place], [address] Phone: [phone]   Fax:  [phone]  Physical Therapy Treatment and DC Summary  Patient Details  Name: Jacob Joseph MRN: [id number] Date of Birth: 02/04/1956 Referring Provider: Burgess Estelle MD   Encounter Date: 03/05/2017      PT End of Session - 03/05/17 1323    Visit Number 13   Number of Visits 17   Date for PT Re-Evaluation 03/08/17  per updated POC   Authorization Type Medicare & G-codes every 10th visit    PT Start Time 9417   PT Stop Time 1400   PT Time Calculation (min) 43 min   Equipment Utilized During Treatment Gait belt   Activity Tolerance Patient tolerated treatment well   Behavior During Therapy [facility] for tasks assessed/performed      Past Medical History:  Diagnosis Date  . Calcium oxalate crystals in urine 12/23/2011   Asymptomatic, no hematuria. Advised to take plenty of water.  [name] Kitchen COPD (chronic obstructive pulmonary disease) ([place])   . Depression   . Diabetes mellitus without complication ([place]) 4081   pre diabetic  . Hepatitis C   . HIV (human immunodeficiency virus infection) ([place])   . Hypertension   . Peripheral arterial disease ([place])    ooccluded left SFA by duplex ultrasound, tibial disease left greater than right  . Stroke ([place]) 11/2011   Carotids Doppler negative. Right sided weakness resolved, initially on Plavix for 3 months and then continued with ASA.    . TB lung, latent 1988   Treated    Past Surgical History:  Procedure Laterality Date  . HERNIA REPAIR    . INGUINAL HERNIA REPAIR  01/16/2012   Procedure: HERNIA REPAIR INGUINAL INCARCERATED;  Surgeon: Zenovia Jarred, MD;  Location: [facility];  Service: General;  Laterality: Right;  . TEE WITHOUT CARDIOVERSION  12/07/2011   Procedure: TRANSESOPHAGEAL ECHOCARDIOGRAM (TEE);  Surgeon: Birdie Riddle, MD;  Location: [facility];  Service: Cardiovascular;  Laterality: N/A;     There were no vitals filed for this visit.      Subjective Assessment - 03/05/17 1321    Subjective Reports getting walker and breathing machine (nebulizer) just needs to get meds for this.  He did report another fall in the night, tripped over grandon's toy in floor.    Pertinent History HIV, COPD, prostate cancer, HTN, diabetes, history of PE, cocaine and tobacco abuse, recurrent falls   Limitations Lifting;Standing;Walking;House hold activities   Patient Stated Goals reduce back pain; reduce falls    Currently in Pain? Yes   Pain Score 7    Pain Location Back   Pain Orientation Lower   Pain Descriptors / Indicators Aching   Pain Type Chronic pain   Pain Onset More than a month ago   Pain Frequency Constant   Aggravating Factors  activity   Pain Relieving Factors lying down                         [facility] Adult PT Treatment/Exercise - 03/06/17 0001      Ambulation/Gait   Ambulation/Gait Yes   Ambulation/Gait Assistance 5: Supervision   Ambulation/Gait Assistance Details Assessed outdoor gait with use of rollator for LTG.  Note that he requires S for safety with cues for correct and safe use of rollator.     Ambulation Distance (Feet) 500 Feet   Assistive device 4-wheeled walker   Gait Pattern Step-through pattern;Decreased  stride length   Ambulation Surface Level;Unlevel;Indoor;Outdoor;Paved   Stairs Yes   Stairs Assistance 5: Supervision   Stairs Assistance Details (indicate cue type and reason) single cue for getting as much of foot onto stair as possible when negotiating stairs.    Stair Management Technique One rail Right;Alternating pattern;Forwards   Number of Stairs 4   Height of Stairs 6   Ramp 5: Supervision   Ramp Details (indicate cue type and reason) S with cues for safety with RW and posture   Curb 5: Supervision   Curb Details (indicate cue type and reason) S for safety with cues for sequencing and correct use of rollator.        Standardized Balance Assessment   Standardized Balance Assessment Dynamic Gait Index     Dynamic Gait Index   Level Surface Mild Impairment   Change in Gait Speed Mild Impairment   Gait with Horizontal Head Turns Normal   Gait with Vertical Head Turns Normal   Gait and Pivot Turn Normal   Step Over Obstacle Mild Impairment   Step Around Obstacles Normal   Steps Mild Impairment   Total Score 20                PT Education - 03/05/17 1323    Education provided Yes   Education Details modified HEP, fall prevention in the home   Person(s) Educated Patient   Methods Explanation   Comprehension Verbalized understanding          PT Short Term Goals - 12/25/16 1327      PT SHORT TERM GOAL #1   Title Patient will verbalize understanding and return demonstration for initial HEP for LE strengthening and flexibility. (TARGET DATE: 12/25/2016)    Baseline 12/25/16: met at previous session per note   Status Achieved     PT SHORT TERM GOAL #2   Title PT will perform 5 time sit to stand test, and short term goal will be set. (TARGET DATE: 12/25/2016)   Baseline 12/25/16: this was performed with STG goal never set. retested today to check progress toward LTGs   Time --   Period --   Status Deferred     PT SHORT TERM GOAL #3   Title PT will perform 6 minute walk test, and short term goal will be set for 75 feet longer than distance achieved at baseline. (TARGET DATE: 12/25/2016)    Baseline 12/25/16: met today with increase of    Time --   Period --   Status Achieved     PT SHORT TERM GOAL #4   Title Patient's gait speed will be > or = 2.47f/s to indicate a significant change in his gait velocity relative to baseline and a decrease in his risk of falling. (TARGET DATE: 12/25/2016)    Baseline 12/25/16: progress has been made, however not to goal level   Time --   Period --   Status Partially Met     PT SHORT TERM GOAL #5   Title Patient will demonstrate ability to ambulate 200 feet  with LRAD and modified independence without a seated rest break on level, indoor surfaces to indicate an increase in his endurance and decrease in his risk of falling. (TARGET DATE: 12/25/2016)    Baseline 12/25/16: met during 6 minute walk test   Time --   Period --   Status Achieved     PT SHORT TERM GOAL #6   Title Patient will score >/=17/24 on DGI to indicate  a decrease in his risk of falling. (TARGET DATE: 12/25/2016)   Baseline 12/25/16: 19/24 scored   Time --   Period --   Status Achieved           PT Long Term Goals - 03/05/17 1324      PT LONG TERM GOAL #1   Title Patient will verbalize understanding and return demonstration for ongoing HEP for LE strengthening and flexibility. (Updated TARGET DATE: 02/28/2017)    Baseline met, but did update on 03/05/17   Time 4   Period Weeks   Status Achieved     PT LONG TERM GOAL #2   Title Patient will perform 5 time sit to stand test in < or = 15s to indicate an increase in functional LE strength.    Baseline 30.72 secs with BUE support on lap, 18.44 secs with hands on lap   Time 4   Period Weeks   Status Achieved     PT LONG TERM GOAL #3   Title PT will perform 6 Minute Walk Test, and long term goal will be set for 75' feet longer than the patient achieves at baseline.    Baseline 675' on 01/29/17, 822' on 03/01/17   Time 4   Period Weeks   Status Achieved     PT LONG TERM GOAL #4   Title Patient will demonstrate ability to ambulate 400 feet with LRAD and supervision on indoor/outdoor surfaces including ramps/curbs/stairs (1 rail) to demonstrate a decrease in his risk of falling when returning to community ambulation.    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   Baseline 20/24 on 03/05/17   Time 4   Period Weeks   Status Achieved               Plan - 0Sep 02, 20180725    Clinical Impression Statement Skilled session focused on addressing remaining goals for D/C from therapy.   Pt has met 5/6 LTGS, partially meeting goal for gait speed.  Note marked improvement in safety with gait, esp with use of rollator for longer distances.  Pt ready for D/C.     Rehab Potential Good   PT Frequency 2x / week   PT Duration 8 weeks   PT Treatment/Interventions ADLs/Self Care Home Management;Neuromuscular re-education;Balance training;Therapeutic exercise;Therapeutic activities;Functional mobility training;Stair training;Gait training;DME Instruction;Patient/family education;Manual techniques   Consulted and Agree with Plan of Care Patient      Patient will benefit from skilled therapeutic intervention in order to improve the following deficits and impairments:  Abnormal gait, Decreased activity tolerance, Decreased balance, Decreased coordination, Decreased safety awareness, Decreased range of motion, Decreased mobility, Decreased knowledge of use of DME, Decreased knowledge of precautions, Decreased endurance, Decreased strength, Difficulty walking, Postural dysfunction, Pain  Visit Diagnosis: Other abnormalities of gait and mobility  Repeated falls  Unsteadiness on feet  Muscle weakness (generalized)       G-Codes - 0September 02, 20180731    Functional Assessment Tool Used (  Outpatient Only) DGI: 20/24   Functional Limitation Mobility: Walking and moving around   Mobility: Walking and Moving Around Current Status [phone]) At least 1 percent but less than 20 percent impaired, limited or restricted   Mobility: Walking and Moving Around Goal Status [phone]) At least 1 percent but less than 20 percent impaired, limited or restricted   Mobility:  Walking and Moving Around Discharge Status [phone]) At least 1 percent but less than 20 percent impaired, limited or restricted      PHYSICAL THERAPY DISCHARGE SUMMARY  Visits from Start of Care: 13  Current functional level related to goals / functional outcomes: See LTGs above   Remaining deficits: Pt continues to have high level balance, endurance and strength deficits.  Pt has not been fully compliant with HEP and recommendations for use of SPC in house at all times.     Education / Equipment: HEP  Plan: Patient agrees to discharge.  Patient goals were partially met. Patient is being discharged due to meeting the stated rehab goals.  ?????         Problem List Patient Active Problem List   Diagnosis Date Noted  . Recurrent falls 11/02/2016  . AKI (acute kidney injury) ([place]) 09/24/2016  . History of pulmonary embolus (PE) 09/22/2016  . History of prostate cancer 09/22/2016  . BPH associated with nocturia 06/19/2016  . PE (pulmonary thromboembolism) ([place]) 05/12/2016  . Substance abuse 01/27/2016  . Vitamin D deficiency 12/05/2015  . Hyperlipidemia 01/28/2015  . Cataracts, bilateral 08/29/2014  . Diabetes mellitus type 2 with complications ([place]) 41/66/0630  . COPD, moderate ([place]) 05/31/2013  . Bilateral lower extremity pain 05/08/2013  . Complex Lt Renal Cyst 03/22/2013  . Preventive measure 05/30/2012  . Normocytic anemia 03/08/2012  . CKD Stage 2 03/08/2012  . ERECTILE DYSFUNCTION 04/25/2008  . Human immunodeficiency virus (HIV) disease ([employer]) 04/27/2006  . Smoking greater than 30 pack years 04/27/2006  . Major depressive disorder, recurrent episode ([place]) 04/27/2006  . Essential hypertension 04/27/2006  . DEGENERATIVE JOINT DISEASE 04/27/2006  . Chronic Low Back Pain 04/27/2006    Cameron Sprang, PT, MPT [facility] [address] [place], [place], [address] Phone: [phone]   Fax:  [phone] 03/06/17, 7:35  AM  Name: Jacob Joseph MRN: [id number] Date of Birth: August 07, 1955

## 2017-03-08 MED ORDER — ALBUTEROL SULFATE (2.5 MG/3ML) 0.083% IN NEBU: 2.5000 mg | INHALATION_SOLUTION | Freq: Four times a day (QID) | RESPIRATORY_TRACT | 12 refills | Status: DC | PRN

## 2017-03-08 NOTE — Addendum Note (Signed)
Addended by: Forde Dandy on: 03/08/2017 05:45 PM   Modules accepted: Orders

## 2017-03-09 ENCOUNTER — Telehealth: Payer: Self-pay | Admitting: *Deleted

## 2017-03-09 NOTE — Telephone Encounter (Signed)
Call to [facility] for PA for Diclofenac Gel.  Approved starting 03/09/2017 thru 07/19/2017.  [place] was called and notified at [phone].  Sander Nephew, RN 03/09/2017 9:43 AM.

## 2017-03-15 ENCOUNTER — Encounter: Payer: Self-pay | Admitting: Internal Medicine

## 2017-03-15 ENCOUNTER — Ambulatory Visit (INDEPENDENT_AMBULATORY_CARE_PROVIDER_SITE_OTHER): Payer: Medicare Other | Admitting: Internal Medicine

## 2017-03-15 ENCOUNTER — Telehealth: Payer: Self-pay | Admitting: *Deleted

## 2017-03-15 VITALS — BP 124/94 | HR 97 | Temp 99.0°F | Ht 72.0 in | Wt 220.6 lb

## 2017-03-15 DIAGNOSIS — E118 Type 2 diabetes mellitus with unspecified complications: Secondary | ICD-10-CM

## 2017-03-15 DIAGNOSIS — N400 Enlarged prostate without lower urinary tract symptoms: Secondary | ICD-10-CM

## 2017-03-15 DIAGNOSIS — R296 Repeated falls: Secondary | ICD-10-CM

## 2017-03-15 DIAGNOSIS — Z7951 Long term (current) use of inhaled steroids: Secondary | ICD-10-CM

## 2017-03-15 DIAGNOSIS — Z7982 Long term (current) use of aspirin: Secondary | ICD-10-CM | POA: Diagnosis not present

## 2017-03-15 DIAGNOSIS — Z79899 Other long term (current) drug therapy: Secondary | ICD-10-CM | POA: Diagnosis not present

## 2017-03-15 DIAGNOSIS — Q6102 Congenital multiple renal cysts: Secondary | ICD-10-CM

## 2017-03-15 DIAGNOSIS — Z7984 Long term (current) use of oral hypoglycemic drugs: Secondary | ICD-10-CM | POA: Diagnosis not present

## 2017-03-15 DIAGNOSIS — N281 Cyst of kidney, acquired: Secondary | ICD-10-CM

## 2017-03-15 DIAGNOSIS — F17210 Nicotine dependence, cigarettes, uncomplicated: Secondary | ICD-10-CM | POA: Diagnosis not present

## 2017-03-15 DIAGNOSIS — E119 Type 2 diabetes mellitus without complications: Secondary | ICD-10-CM

## 2017-03-15 DIAGNOSIS — Z21 Asymptomatic human immunodeficiency virus [HIV] infection status: Secondary | ICD-10-CM | POA: Diagnosis not present

## 2017-03-15 DIAGNOSIS — J449 Chronic obstructive pulmonary disease, unspecified: Secondary | ICD-10-CM

## 2017-03-15 DIAGNOSIS — E785 Hyperlipidemia, unspecified: Secondary | ICD-10-CM | POA: Diagnosis not present

## 2017-03-15 DIAGNOSIS — Z299 Encounter for prophylactic measures, unspecified: Secondary | ICD-10-CM

## 2017-03-15 DIAGNOSIS — I10 Essential (primary) hypertension: Secondary | ICD-10-CM | POA: Diagnosis not present

## 2017-03-15 DIAGNOSIS — J441 Chronic obstructive pulmonary disease with (acute) exacerbation: Secondary | ICD-10-CM

## 2017-03-15 MED ORDER — ZOSTER VAC RECOMB ADJUVANTED 50 MCG/0.5ML IM SUSR: 0.5000 mL | Freq: Once | INTRAMUSCULAR | 0 refills | Status: AC

## 2017-03-15 MED ORDER — ALBUTEROL SULFATE (2.5 MG/3ML) 0.083% IN NEBU: 2.5000 mg | INHALATION_SOLUTION | Freq: Four times a day (QID) | RESPIRATORY_TRACT | 12 refills | Status: DC | PRN

## 2017-03-15 NOTE — Assessment & Plan Note (Addendum)
Patient is currently on stiolto and budesonide. He says that he feels some dyspnea intermittently which resolves after he takes albuterol. His repeat pulse was 97 in the office. He is compliant with his edoxaban and does not miss any doses. He does not use home oxygen. He continues to smoke 2-3 cigarettes per day.  Plan -continue stiolto and budesonide -advised complete smoking cessation  -follow up in 3 months

## 2017-03-15 NOTE — Patient Instructions (Signed)
Thank you for your visit today  Please go to the pharmacy and obtain vaccine for shingles  Please continue physical therapy  Please follow up in 3 months

## 2017-03-15 NOTE — Assessment & Plan Note (Signed)
Pt says he continues to fall intermittently- but his gait and balance has improved after starting physical therapy as advised at last visit- and he also has obtained a rollator  Plan -continue physical therapy

## 2017-03-15 NOTE — Progress Notes (Signed)
    CC: HTN, COPD, DM, BPH, smoking, recurrent falls, HM, history of renal cysts , HLD HPI: [name] is a 61 y.o. man with PMH noted below here for HTN, COPD, DM, BPH, smoking, recurrent falls, HM, history of renal cysts , HLD  Please see Problem List/A&P for the status of the patient's chronic medical problems   Past Medical History:  Diagnosis Date  . Calcium oxalate crystals in urine 12/23/2011   Asymptomatic, no hematuria. Advised to take plenty of water.  [name] Kitchen COPD (chronic obstructive pulmonary disease) ([place])   . Depression   . Diabetes mellitus without complication ([place]) 6378   pre diabetic  . Hepatitis C   . HIV (human immunodeficiency virus infection) ([place])   . Hypertension   . Peripheral arterial disease ([place])    ooccluded left SFA by duplex ultrasound, tibial disease left greater than right  . Stroke ([place]) 11/2011   Carotids Doppler negative. Right sided weakness resolved, initially on Plavix for 3 months and then continued with ASA.    . TB lung, latent 1988   Treated    Review of Systems:  Constitutional: Negative for fever, chills, weight loss and malaise/fatigue.  HEENT: No headaches, vision problems, cough, hearing problems  Respiratory: Negative for cough, feels some intermittent dyspnea.  Cardiovascular: Negative for chest pain, orthopnea  Gastrointestinal: Negative for heartburn, nausea, vomiting, abdominal pain, diarrhea and constipation MSK: has intermittent falls, improved after PT and rollator   Physical Exam: Vitals:   03/15/17 1409 03/15/17 1501  BP: 140/74 (!) 124/94  Pulse: (!) 111 97  Temp: 99 F (37.2 C)   TempSrc: Oral   SpO2: 98%   Weight: 220 lb 9.6 oz (100.1 kg)   Height: 6' (1.829 m)     General: A&O, in NAD HEENT: EOMI, sclera white, conjunctiva pink  Neck: supple, midline trachea CV: RRR, normal s1, s2, no m/r/g, Resp: equal and symmetric breath sounds, no wheezing or crackles  Abdomen: soft, nontender, nondistended,  +BS Skin: warm, dry, intact Extremities: no edema   Assessment & Plan:   See encounters tab for problem based medical decision making. Patient discussed with Dr. Daryll Drown

## 2017-03-15 NOTE — Assessment & Plan Note (Signed)
I discussed with pt about shingrix vaccine and pt interested in that- given prescription for that.   Plan -given prescription for shingrix

## 2017-03-15 NOTE — Assessment & Plan Note (Signed)
Lab Results  Component Value Date   HGBA1C 6.1 02/01/2017   HGBA1C 6.2 11/02/2016   HGBA1C 6.3 06/19/2016    Pt is currently on metformin 500 mg daily and his A1c last month was 6.1.  Plan -continue metformin -pravastatin -aspirin -up to date on eye and foot exam -RTC in 3 months

## 2017-03-15 NOTE — Telephone Encounter (Signed)
Dr Maudie Mercury, pt states you were getting him some smoking patches, please advise

## 2017-03-15 NOTE — Assessment & Plan Note (Signed)
BP Readings from Last 3 Encounters:  03/15/17 (!) 124/94  02/01/17 130/89  11/02/16 131/82   Patient's repeat BP is 124/94 and he is compliant with his lisinopril 5 mg daily  Plan -continue lisinopril 5 mg daily -follow up in 3 months

## 2017-03-15 NOTE — Assessment & Plan Note (Signed)
Patient currently smokes 2-3 cigs per day and has > 30 pack year smoking history. He uses the nicotine patches. He underwent low dose CT scan of lung last year and repeat one due this year.  Plan -ordered low dose CT scan of lung  -advised complete cessation of smoking -pt picked up nicotine patch from here

## 2017-03-15 NOTE — Assessment & Plan Note (Signed)
An MRi of abd done in 2014 noted multiple bilateral renal cysts- most of them being Bosniak category 1 and 2- repeat Imaging was advised. I advised pt on this and he wanted to wait for the imaging.  Plan -readdress at next visit.

## 2017-03-15 NOTE — Assessment & Plan Note (Signed)
Patient's crestor was changed to pravastatin due to interaction with his HIV medications. He is compliant with the pravastatin  Plan -continue pravastatin

## 2017-03-16 MED ORDER — NICOTINE 7 MG/24HR TD PT24: MEDICATED_PATCH | TRANSDERMAL | 3 refills | Status: DC

## 2017-03-16 NOTE — Addendum Note (Signed)
Addended by: Forde Dandy on: 03/16/2017 12:42 PM   Modules accepted: Orders

## 2017-03-16 NOTE — Telephone Encounter (Signed)
[facility] didn't figure out how to process the patches through his medicaid so I am going to transfer the prescription to [facility].  Thank you!

## 2017-03-17 ENCOUNTER — Ambulatory Visit: Payer: Medicare Other | Admitting: Pharmacist

## 2017-03-17 NOTE — Progress Notes (Signed)
Internal Medicine Clinic Attending  Case discussed with Dr. Saraiya at the time of the visit.  We reviewed the resident's history and exam and pertinent patient test results.  I agree with the assessment, diagnosis, and plan of care documented in the resident's note.  

## 2017-03-17 NOTE — Addendum Note (Signed)
Addended by: Gilles Chiquito B on: 03/17/2017 02:43 PM   Modules accepted: Level of Service

## 2017-03-18 DIAGNOSIS — C61 Malignant neoplasm of prostate: Secondary | ICD-10-CM | POA: Diagnosis not present

## 2017-03-24 ENCOUNTER — Ambulatory Visit (HOSPITAL_COMMUNITY): Payer: Medicare Other

## 2017-03-24 DIAGNOSIS — E119 Type 2 diabetes mellitus without complications: Secondary | ICD-10-CM | POA: Diagnosis not present

## 2017-03-26 ENCOUNTER — Other Ambulatory Visit: Payer: Medicare Other

## 2017-03-26 DIAGNOSIS — B20 Human immunodeficiency virus [HIV] disease: Secondary | ICD-10-CM | POA: Diagnosis not present

## 2017-03-26 LAB — T-HELPER CELL (CD4) - (RCID CLINIC ONLY)
CD4 % Helper T Cell: 16 % — ABNORMAL LOW (ref 33–55)
CD4 T CELL ABS: 500 /uL (ref 400–2700)

## 2017-03-29 DIAGNOSIS — C775 Secondary and unspecified malignant neoplasm of intrapelvic lymph nodes: Secondary | ICD-10-CM | POA: Diagnosis not present

## 2017-03-29 DIAGNOSIS — C61 Malignant neoplasm of prostate: Secondary | ICD-10-CM | POA: Diagnosis not present

## 2017-03-29 DIAGNOSIS — N5201 Erectile dysfunction due to arterial insufficiency: Secondary | ICD-10-CM | POA: Diagnosis not present

## 2017-03-29 LAB — HIV-1 RNA QUANT-NO REFLEX-BLD
HIV 1 RNA QUANT: DETECTED {copies}/mL — AB
HIV-1 RNA QUANT, LOG: DETECTED {Log_copies}/mL — AB

## 2017-03-30 ENCOUNTER — Ambulatory Visit: Payer: Medicare Other | Admitting: Internal Medicine

## 2017-04-01 ENCOUNTER — Ambulatory Visit: Payer: Medicare Other | Admitting: Internal Medicine

## 2017-04-02 ENCOUNTER — Other Ambulatory Visit: Payer: Self-pay | Admitting: Internal Medicine

## 2017-04-05 ENCOUNTER — Encounter: Payer: Self-pay | Admitting: Podiatry

## 2017-04-05 ENCOUNTER — Ambulatory Visit (INDEPENDENT_AMBULATORY_CARE_PROVIDER_SITE_OTHER): Payer: Medicare Other | Admitting: Podiatry

## 2017-04-05 DIAGNOSIS — B351 Tinea unguium: Secondary | ICD-10-CM

## 2017-04-05 DIAGNOSIS — M79676 Pain in unspecified toe(s): Secondary | ICD-10-CM | POA: Diagnosis not present

## 2017-04-05 DIAGNOSIS — E1142 Type 2 diabetes mellitus with diabetic polyneuropathy: Secondary | ICD-10-CM

## 2017-04-05 MED FILL — NICOTINE 7 MG/24HR PATCH: 7 | 14 days supply | Qty: 14 | Fill #0

## 2017-04-05 NOTE — Progress Notes (Signed)
Patient ID: Jacob Joseph, male   DOB: [birth year], 61 y.o.   MRN: [id number]   Subjective: This patient presents today complaining of thickened and elongated toenails which are uncomfortable when walking wearing shoes and requests toenail debridement. He was last seen for this visit on 11/26/2014 and at that time advised to return at three-month intervals for debridement of the mycotic toenails. He does not present until today for follow-up care. He denies any open lesions, claudication since the previous visit Patient has history of previous use of terbinafine for toenail fungus Patient is HIV-positive and a diabetic      Patient appears orientated 3  Vascular: DP 0/4 bilaterally PT right 2/4 PT left 0/4 Capillary reflex immediate bilaterally  Neurological: Sensation to 10 g monofilament wire intact 3/5 bilaterally Vibratory sensation reactive bilaterally Ankle reflex reactive bilaterally  Dermatological: No open skin lesions bilaterally Dry moccasin scaling skin medially plantarly and laterally bilaterally The toenails are extremely elongated, hypertrophic, brittle, discolored and tender to back palpation 6-10  Musculoskeletal: HAV deformities bilaterally     Assessment: Decrease pedal pulses suggestive peripheral arterial disease Diabetic peripheral neuropathy symptomatic onychomycoses 6-10 Tinea pedis bilaterally HIV-positive  Plan:  Nails 10 are debrided mechanically and electrically without any bleeding  Reappoint 3 months

## 2017-04-05 NOTE — Patient Instructions (Signed)

## 2017-04-15 ENCOUNTER — Other Ambulatory Visit: Payer: Self-pay | Admitting: Internal Medicine

## 2017-04-15 DIAGNOSIS — A6000 Herpesviral infection of urogenital system, unspecified: Secondary | ICD-10-CM

## 2017-04-22 ENCOUNTER — Ambulatory Visit (INDEPENDENT_AMBULATORY_CARE_PROVIDER_SITE_OTHER): Payer: Medicare Other | Admitting: Internal Medicine

## 2017-04-22 ENCOUNTER — Encounter: Payer: Self-pay | Admitting: Internal Medicine

## 2017-04-22 DIAGNOSIS — F17210 Nicotine dependence, cigarettes, uncomplicated: Secondary | ICD-10-CM | POA: Diagnosis not present

## 2017-04-22 DIAGNOSIS — Z23 Encounter for immunization: Secondary | ICD-10-CM | POA: Diagnosis present

## 2017-04-22 DIAGNOSIS — B20 Human immunodeficiency virus [HIV] disease: Secondary | ICD-10-CM | POA: Diagnosis not present

## 2017-04-22 DIAGNOSIS — Z8673 Personal history of transient ischemic attack (TIA), and cerebral infarction without residual deficits: Secondary | ICD-10-CM | POA: Diagnosis not present

## 2017-04-22 NOTE — Progress Notes (Signed)
Patient Active Problem List   Diagnosis Date Noted  . Human immunodeficiency virus (HIV) disease ([place]) 04/27/2006    Priority: High  . Recurrent falls 11/02/2016  . AKI (acute kidney injury) ([place]) 09/24/2016  . History of pulmonary embolus (PE) 09/22/2016  . History of prostate cancer 09/22/2016  . BPH associated with nocturia 06/19/2016  . PE (pulmonary thromboembolism) ([place]) 05/12/2016  . Substance abuse ([place]) 01/27/2016  . Vitamin D deficiency 12/05/2015  . Hyperlipidemia 01/28/2015  . Cataracts, bilateral 08/29/2014  . Diabetes mellitus type 2 with complications ([place]) 94/80/1655  . COPD, moderate ([place]) 05/31/2013  . Bilateral lower extremity pain 05/08/2013  . Complex Lt Renal Cyst 03/22/2013  . Preventive measure 05/30/2012  . History of CVA 03/08/2012  . Normocytic anemia 03/08/2012  . CKD Stage 2 03/08/2012  . ERECTILE DYSFUNCTION 04/25/2008  . Smoking greater than 30 pack years 04/27/2006  . Major depressive disorder, recurrent episode ([employer]) 04/27/2006  . Essential hypertension 04/27/2006  . DEGENERATIVE JOINT DISEASE 04/27/2006  . Chronic Low Back Pain 04/27/2006    Patient's Medications  New Prescriptions   No medications on file  Previous Medications   ALBUTEROL (PROAIR HFA) 108 (90 BASE) MCG/ACT INHALER    INHALE 2 PUFFS INTO THE LUNGS EVERY 6 HOURS AS NEEDED FOR WHEEZING OR SHORTNESS OF BREATH, TO NOT USE EVERY DAY, USE ONLY IF NEEDED   ALBUTEROL (PROVENTIL) (2.5 MG/3ML) 0.083% NEBULIZER SOLUTION    Take 3 mLs (2.5 mg total) by nebulization every 6 (six) hours as needed for wheezing or shortness of breath.   ASPIRIN 81 MG EC TABLET    Take 1 tablet (81 mg total) by mouth daily.   BLOOD GLUCOSE MONITORING SUPPL (ONETOUCH VERIO) W/DEVICE KIT    1 Device by Does not apply route 2 (two) times daily. ICD 10  E11.8, non-insulin dependent, test up to twice daily   BUDESONIDE 90 MCG/ACT INHALER    Inhale 1-2 puffs into the lungs 2 (two) times daily.   CHOLECALCIFEROL (VITAMIN D) 1000 UNITS TABLET    Take 1 tablet (1,000 Units total) by mouth daily.   CITALOPRAM (CELEXA) 20 MG TABLET    Take 20 mg by mouth daily.   DARUNAVIR (PREZISTA) 800 MG TABLET    TAKE 1 TABLET BY MOUTH EVERY DAY WITH BREAKFAST   DICLOFENAC SODIUM (VOLTAREN) 1 % GEL    Apply 4 g topically 4 (four) times daily as needed (pain).   EDOXABAN (SAVAYSA) 60 MG TABS TABLET    Take 60 mg by mouth daily.   ELVITEGRAVIR-COBICISTAT-EMTRICITABINE-TENOFOVIR (GENVOYA) 150-150-200-10 MG TABS TABLET    TAKE 1 TABLET BY MOUTH DAILY WITH BREAKFAST(TAKE WITH PREZISTA)   ELVITEGRAVIR-COBICISTAT-EMTRICITABINE-TENOFOVIR (GENVOYA) 150-150-200-10 MG TABS TABLET    Take 1 tablet by mouth daily with breakfast.   GABAPENTIN (NEURONTIN) 300 MG CAPSULE    Take 2 capsules (600 mg total) by mouth at bedtime.   GLUCOSE BLOOD (ONETOUCH VERIO) TEST STRIP    Use as instructed ICD 10  E11.8, non-insulin dependent, test up to twice daily   GUAIFENESIN-DEXTROMETHORPHAN (ROBITUSSIN DM) 100-10 MG/5ML SYRUP    Take 5 mLs by mouth every 4 (four) hours as needed for cough.   HYDROXYPROPYL METHYLCELLULOSE / HYPROMELLOSE (ISOPTO TEARS / GONIOVISC) 2.5 % OPHTHALMIC SOLUTION    Place 1 drop into both eyes as needed for dry eyes.   LISINOPRIL (PRINIVIL,ZESTRIL) 5 MG TABLET    Take 1 tablet (5 mg total) by mouth daily.  LISINOPRIL (PRINIVIL,ZESTRIL) 5 MG TABLET    TAKE 1 TABLET BY MOUTH DAILY   METFORMIN (GLUCOPHAGE) 500 MG TABLET    TAKE 1 TABLET BY MOUTH EVERY DAY WITH BREAKFAST   MISC. DEVICES (ROLLATOR) MISC    1 rollator   NICOTINE (NICODERM CQ - DOSED IN MG/24 HR) 7 MG/24HR PATCH    Apply 1 patch, change each day, remove at night if sleep disturbance. MEDICAID [id number], MEDICARE A AND B [id number]   NICOTINE POLACRILEX (COMMIT) 2 MG LOZENGE    Use 1 lozenge by mouth every 2-4 hours as needed. Max 20 lozenges per day.   ONETOUCH DELICA LANCETS FINE MISC    1 Stick by Does not apply route 2 (two) times daily. ICD 10   E11.8, non-insulin dependent, test up to twice daily   PRAVASTATIN (PRAVACHOL) 40 MG TABLET    Take 1 tablet (40 mg total) by mouth daily.   QUETIAPINE (SEROQUEL) 300 MG TABLET    Take 300 mg by mouth 2 (two) times daily.    TAMSULOSIN (FLOMAX) 0.4 MG CAPS CAPSULE    TAKE 1 CAPSULE(0.4 MG) BY MOUTH DAILY AFTER BREAKFAST   TIOTROPIUM BROMIDE-OLODATEROL (STIOLTO RESPIMAT) 2.5-2.5 MCG/ACT AERS    Inhale 2 puffs into the lungs daily.   VALACYCLOVIR (VALTREX) 500 MG TABLET    TAKE 1 TABLET BY MOUTH DAILY  Modified Medications   No medications on file  Discontinued Medications   No medications on file    Subjective: Jacob Joseph is in for his routine HIV follow-up visit. He has had no problems obtaining, taking or tolerating his Genvoya and Prezista. He does not recall missing any doses. He has been out of his baby aspirin for the last week but plans on getting it filled today. He denies any cocaine or other drug use since his last visit. He probably tells me that he quit smoking 2 weeks ago.  Review of Systems: Review of Systems  Constitutional: Negative for chills, diaphoresis, fever, malaise/fatigue and weight loss.  HENT: Negative for sore throat.   Respiratory: Negative for cough, sputum production and shortness of breath.   Cardiovascular: Negative for chest pain.  Gastrointestinal: Negative for abdominal pain, diarrhea, heartburn, nausea and vomiting.  Genitourinary: Negative for dysuria and frequency.  Musculoskeletal: Negative for joint pain and myalgias.  Skin: Negative for rash.  Neurological: Positive for focal weakness. Negative for dizziness and headaches.  Psychiatric/Behavioral: Negative for depression and substance abuse. The patient is not nervous/anxious.     Past Medical History:  Diagnosis Date  . Calcium oxalate crystals in urine 12/23/2011   Asymptomatic, no hematuria. Advised to take plenty of water.  [name] Kitchen COPD (chronic obstructive pulmonary disease) ([place])   . Depression   .  Diabetes mellitus without complication ([place]) 9528   pre diabetic  . Hepatitis C   . HIV (human immunodeficiency virus infection) ([place])   . Hypertension   . Peripheral arterial disease ([place])    ooccluded left SFA by duplex ultrasound, tibial disease left greater than right  . Stroke ([place]) 11/2011   Carotids Doppler negative. Right sided weakness resolved, initially on Plavix for 3 months and then continued with ASA.    . TB lung, latent 1988   Treated    Social History  Substance Use Topics  . Smoking status: Former Smoker    Packs/day: 0.15    Types: Cigarettes    Quit date: 11/18/2014  . Smokeless tobacco: Never Used     Comment: ~ 20 pack years  .  Alcohol use No    Family History  Problem Relation Age of Onset  . Hypertension Mother   . Heart attack Mother   . Stroke Mother   . Hypertension Father   . Cancer Father     No Known Allergies  Health Maintenance  Topic Date Due  . INFLUENZA VACCINE  02/17/2017  . HEMOGLOBIN A1C  05/04/2017  . OPHTHALMOLOGY EXAM  06/19/2017  . LIPID PANEL  08/24/2017  . FOOT EXAM  02/01/2018  . TETANUS/TDAP  05/30/2022  . COLONOSCOPY  05/17/2023  . PNEUMOCOCCAL POLYSACCHARIDE VACCINE  Completed  . Hepatitis C Screening  Completed  . HIV Screening  Completed    Objective:  Vitals:   04/22/17 1002  BP: 129/88  Pulse: 90  Temp: 98.4 F (36.9 C)  TempSrc: Oral  Weight: 219 lb (99.3 kg)   Body mass index is 29.7 kg/m.  Physical Exam  Constitutional: He is oriented to person, place, and time.  He is in good spirits. He is accompanied by his wife and grandson.  HENT:  Mouth/Throat: No oropharyngeal exudate.  Eyes: Conjunctivae are normal.  Cardiovascular: Normal rate and regular rhythm.   No murmur heard. Pulmonary/Chest: Effort normal and breath sounds normal. He has no wheezes. He has no rales.  Abdominal: Soft. He exhibits no mass. There is no tenderness.  Musculoskeletal: Normal range of motion.  Neurological: He is  alert and oriented to person, place, and time.  He walks with the aid of a cane.  Skin: No rash noted.  Psychiatric: Mood and affect normal.    Lab Results Lab Results  Component Value Date   WBC 14.3 (H) 09/23/2016   HGB 11.1 (L) 09/23/2016   HCT 34.5 (L) 09/23/2016   MCV 85.6 09/23/2016   PLT 178 09/23/2016    Lab Results  Component Value Date   CREATININE 1.14 09/23/2016   BUN 17 09/23/2016   NA 137 09/23/2016   K 4.5 09/23/2016   CL 106 09/23/2016   CO2 18 (L) 09/23/2016    Lab Results  Component Value Date   ALT 32 09/21/2016   AST 54 (H) 09/21/2016   ALKPHOS 76 09/21/2016   BILITOT 0.7 09/21/2016    Lab Results  Component Value Date   CHOL 119 08/24/2016   HDL 39 (L) 08/24/2016   LDLCALC 52 08/24/2016   TRIG 142 08/24/2016   CHOLHDL 3.1 08/24/2016   Lab Results  Component Value Date   LABRPR NON REAC 08/24/2016   HIV 1 RNA Quant (copies/mL)  Date Value  03/26/2017 <20 DETECTED (A)  08/24/2016 <20 DETECTED (A)  01/15/2016 <20   CD4 T Cell Abs (/uL)  Date Value  03/26/2017 500  08/24/2016 680  01/15/2016 670     Problem List Items Addressed This Visit      High   Human immunodeficiency virus (HIV) disease ([place]) (Chronic)    His infection is under excellent, long-term control. He will continue his current antiretroviral regimen and follow-up after lab work in 6 months.      Relevant Orders   T-helper cell (CD4)- ([facility] clinic only)   HIV 1 RNA quant-no reflex-bld   CBC   Comprehensive metabolic panel   RPR     Unprioritized   History of CVA (Chronic)    I encouraged him to get back on his aspirin as soon as possible.      Smoking greater than 30 pack years    I congratulated him on quitting smoking.  Other Visit Diagnoses    Need for immunization against influenza       Relevant Orders   Flu Vaccine QUAD 36+ mos IM (Completed)        Michel Bickers, MD [facility] [place] [address]  [phone] pager   [phone] cell 04/22/2017, 10:46 AM

## 2017-04-22 NOTE — Assessment & Plan Note (Signed)
His infection is under excellent, long-term control. He will continue his current antiretroviral regimen and follow-up after lab work in 6 months. 

## 2017-04-22 NOTE — Assessment & Plan Note (Signed)
I encouraged him to get back on his aspirin as soon as possible.

## 2017-04-22 NOTE — Assessment & Plan Note (Signed)
I congratulated him on quitting smoking.

## 2017-05-12 ENCOUNTER — Other Ambulatory Visit: Payer: Self-pay | Admitting: Student-PharmD

## 2017-05-13 ENCOUNTER — Other Ambulatory Visit: Payer: Self-pay | Admitting: Internal Medicine

## 2017-05-14 ENCOUNTER — Other Ambulatory Visit: Payer: Self-pay | Admitting: Pharmacist

## 2017-05-14 DIAGNOSIS — J441 Chronic obstructive pulmonary disease with (acute) exacerbation: Secondary | ICD-10-CM

## 2017-05-15 MED ORDER — TIOTROPIUM BROMIDE-OLODATEROL 2.5-2.5 MCG/ACT IN AERS: 2.0000 | INHALATION_SPRAY | Freq: Every day | RESPIRATORY_TRACT | 3 refills | Status: DC

## 2017-05-15 NOTE — Telephone Encounter (Signed)
Not on med list Needs appt to discuss

## 2017-05-17 ENCOUNTER — Other Ambulatory Visit: Payer: Self-pay | Admitting: Internal Medicine

## 2017-06-05 ENCOUNTER — Other Ambulatory Visit: Payer: Self-pay | Admitting: Internal Medicine

## 2017-06-08 ENCOUNTER — Telehealth: Payer: Self-pay | Admitting: *Deleted

## 2017-06-08 NOTE — Telephone Encounter (Signed)
Just called patient and no answer.  Phone went straight to voicemail.  An appointment was scheduled for 06/16/17 at 2:45 pm with Dr. Tiburcio Pea.  Message was left on voicemail informing patient of this appointment date and time.

## 2017-06-08 NOTE — Telephone Encounter (Signed)
Pt needs appt was to f/u w/ dr Tiburcio Pea this month, pharmacy called about refusal of flonase, I see dr Tiburcio Pea has some upcoming slots could we please schedule pt for one of these?

## 2017-06-11 ENCOUNTER — Other Ambulatory Visit: Payer: Self-pay | Admitting: Internal Medicine

## 2017-06-11 DIAGNOSIS — E118 Type 2 diabetes mellitus with unspecified complications: Secondary | ICD-10-CM

## 2017-06-14 ENCOUNTER — Other Ambulatory Visit: Payer: Self-pay | Admitting: Internal Medicine

## 2017-06-14 DIAGNOSIS — B20 Human immunodeficiency virus [HIV] disease: Secondary | ICD-10-CM

## 2017-06-14 MED ORDER — ELVITEG-COBIC-EMTRICIT-TENOFAF 150-150-200-10 MG PO TABS: 1.0000 | ORAL_TABLET | Freq: Every day | ORAL | 5 refills | Status: DC

## 2017-06-14 NOTE — Telephone Encounter (Signed)
Yes, that is why I did not approve the refill He is currently on budesonide and stiolto- for copd I am unsure why the pt is requesting flonase and I saw he has an appt this wed so will discuss then  Thanks

## 2017-06-16 ENCOUNTER — Encounter: Payer: Self-pay | Admitting: Internal Medicine

## 2017-06-16 ENCOUNTER — Encounter: Payer: Medicare Other | Admitting: Internal Medicine

## 2017-06-16 ENCOUNTER — Ambulatory Visit (INDEPENDENT_AMBULATORY_CARE_PROVIDER_SITE_OTHER): Payer: Medicare Other | Admitting: Internal Medicine

## 2017-06-16 VITALS — BP 121/76 | HR 115 | Temp 98.2°F | Ht 72.0 in | Wt 225.5 lb

## 2017-06-16 DIAGNOSIS — I10 Essential (primary) hypertension: Secondary | ICD-10-CM

## 2017-06-16 DIAGNOSIS — Z299 Encounter for prophylactic measures, unspecified: Secondary | ICD-10-CM | POA: Diagnosis not present

## 2017-06-16 DIAGNOSIS — Z7984 Long term (current) use of oral hypoglycemic drugs: Secondary | ICD-10-CM

## 2017-06-16 DIAGNOSIS — J449 Chronic obstructive pulmonary disease, unspecified: Secondary | ICD-10-CM | POA: Diagnosis not present

## 2017-06-16 DIAGNOSIS — E118 Type 2 diabetes mellitus with unspecified complications: Secondary | ICD-10-CM

## 2017-06-16 DIAGNOSIS — E119 Type 2 diabetes mellitus without complications: Secondary | ICD-10-CM

## 2017-06-16 DIAGNOSIS — N281 Cyst of kidney, acquired: Secondary | ICD-10-CM

## 2017-06-16 LAB — GLUCOSE, CAPILLARY: GLUCOSE-CAPILLARY: 122 mg/dL — AB (ref 65–99)

## 2017-06-16 LAB — POCT GLYCOSYLATED HEMOGLOBIN (HGB A1C): HEMOGLOBIN A1C: 6.1

## 2017-06-16 MED ORDER — AZELASTINE HCL 0.1 % NA SOLN: 1.0000 | Freq: Two times a day (BID) | NASAL | 3 refills | Status: DC

## 2017-06-16 NOTE — Assessment & Plan Note (Addendum)
BP Readings from Last 3 Encounters:  06/16/17 121/76  04/22/17 129/88  03/15/17 (!) 124/94   Pt is compliant with his lisinopril. Renal function within normal limits in March 2018, and urine microalb normal in July. He is normotensive today  Plan -continue lisinopril daily   Of note- he was tachycardic to 115 today during the visit, even after repeat pulse check. Looking back at prior visits, he has been tachycardic then also. He denies any chest pain, or dyspnea either at rest or exertion. He is compliant with the edoxaban, and he was not hypoxic today. I advised him to get the EKG but pt declined to have the EKG done today as he was waiting for his ride.

## 2017-06-16 NOTE — Assessment & Plan Note (Signed)
Ordered MRI w and wo contrast as MRi in 2014 showed bosniak category 1-2 bilateral renal cysts and recommendation was to repeat imaging. I discussed with Dr. Heber Oaklyn to get MRI both w and wo contrast   Plan -ordered Mri abd w and wo contrast

## 2017-06-16 NOTE — Progress Notes (Signed)
   CC: HTN, COPD, DM, renal cysts, hm HPI: JacobJacob Joseph is a 61 y.o. man with PMH noted below here for HTN, COPD, DM, renal cysts, hm  Please see Problem List/A&P for the status of the patient's chronic medical problems   Past Medical History:  Diagnosis Date  . Calcium oxalate crystals in urine 12/23/2011   Asymptomatic, no hematuria. Advised to take plenty of water.  [name] Kitchen COPD (chronic obstructive pulmonary disease) ([place])   . Depression   . Diabetes mellitus without complication ([place]) 0131   pre diabetic  . Hepatitis C   . HIV (human immunodeficiency virus infection) ([place])   . Hypertension   . Peripheral arterial disease ([place])    ooccluded left SFA by duplex ultrasound, tibial disease left greater than right  . Stroke ([place]) 11/2011   Carotids Doppler negative. Right sided weakness resolved, initially on Plavix for 3 months and then continued with ASA.    . TB lung, latent 1988   Treated    Review of Systems:  Constitutional: Negative for fever, chills, weight loss and malaise/fatigue.  HEENT: No headaches, vision problems, cough, hearing problems  Respiratory: Negative for cough, shortness of breath and wheezing.  Cardiovascular: Negative for chest pain, orthopnea, or PND   Gastrointestinal: Negative for heartburn, nausea, vomiting, abdominal pain   Physical Exam: Vitals:   06/16/17 1453  BP: 121/76  Pulse: (!) 115  Temp: 98.2 F (36.8 C)  SpO2: 97%  Weight: 225 lb 8 oz (102.3 kg)  Height: 6' (1.829 m)    General: A&O, in NAD CV: tachycardic to 115, normal s1, s2, no m/r/g,  Resp: equal and symmetric breath sounds, no wheezing or crackles  Abdomen: soft, nontender, nondistended, +BS Skin: warm, dry, intact, no open lesions or rashes noted Extremities: no edema   Assessment & Plan:   See encounters tab for problem based medical decision making. Patient discussed with Dr. Angelia Mould

## 2017-06-16 NOTE — Assessment & Plan Note (Signed)
This problem is chronic and stable. He is compliant with the stiolto and budesonide.   Plan -continue stiolto and budesonide

## 2017-06-16 NOTE — Patient Instructions (Addendum)
Thank you for your visit today Please use the azelastine spray for your allergies Please follow up in 3-6 months

## 2017-06-16 NOTE — Assessment & Plan Note (Addendum)
Given flu vaccine  Pt wanted to get a refill of the flonase- I explained that it is contraindicated with his HIV medications, and instead gave him azelastine spray for seasonal allergies.

## 2017-06-16 NOTE — Assessment & Plan Note (Signed)
Lab Results  Component Value Date   HGBA1C 6.1 06/16/2017   HGBA1C 6.1 02/01/2017   HGBA1C 6.2 11/02/2016    [name] a1c is at goal and he is compliant with his metformin daily  Plan -continue metformin daily

## 2017-06-17 ENCOUNTER — Telehealth: Payer: Self-pay | Admitting: Student-PharmD

## 2017-06-17 NOTE — Progress Notes (Signed)
Contacted pt regarding COPD management and inhaler use. Pt endorses using albuterol inhaler PRN and has decreased his use of rescue inhaler. Pt reports taking Stiloto once a day as directed and does not report any issues or side effects related to inhalers. Pt requested a refill on Stiolto. Advised pt to contact clinic if he has any questions or issues. Pt verbalized understanding.

## 2017-06-17 NOTE — Telephone Encounter (Signed)
Patient was contacted with Lubertha Sayres, PharmD candidate. I agree with the assessment and plan of care documented.

## 2017-06-21 NOTE — Progress Notes (Signed)
Internal Medicine Clinic Attending  Case discussed with Dr. Saraiya at the time of the visit.  We reviewed the resident's history and exam and pertinent patient test results.  I agree with the assessment, diagnosis, and plan of care documented in the resident's note.  

## 2017-07-02 ENCOUNTER — Ambulatory Visit (HOSPITAL_COMMUNITY): Payer: Medicare Other

## 2017-07-05 ENCOUNTER — Ambulatory Visit: Payer: Medicare Other | Admitting: Podiatry

## 2017-07-14 ENCOUNTER — Other Ambulatory Visit: Payer: Self-pay | Admitting: Internal Medicine

## 2017-07-26 ENCOUNTER — Encounter: Payer: Self-pay | Admitting: Podiatry

## 2017-07-26 ENCOUNTER — Ambulatory Visit (INDEPENDENT_AMBULATORY_CARE_PROVIDER_SITE_OTHER): Payer: Medicare Other | Admitting: Podiatry

## 2017-07-26 DIAGNOSIS — M79676 Pain in unspecified toe(s): Secondary | ICD-10-CM

## 2017-07-26 DIAGNOSIS — B351 Tinea unguium: Secondary | ICD-10-CM | POA: Diagnosis not present

## 2017-07-26 DIAGNOSIS — E1142 Type 2 diabetes mellitus with diabetic polyneuropathy: Secondary | ICD-10-CM

## 2017-07-28 NOTE — Progress Notes (Signed)
Subjective:   Patient ID: Jacob Joseph, male   DOB: 62 y.o.   MRN: [id number]   HPI Obese diabetic presents with elongated thickened nails 1-5 both feet that he cannot reach cut to become painful in shoes   ROS      Objective:  Physical Exam  Thick yellow brittle nailbeds 1-5 both feet with incurvation the corner and distal redness with pain     Assessment:  Mycotic nail infection with pain 1-5 both feet     Plan:  Debride painful nailbeds 1-5 both feet with no iatrogenic bleeding noted

## 2017-08-06 ENCOUNTER — Other Ambulatory Visit: Payer: Self-pay | Admitting: Internal Medicine

## 2017-08-11 ENCOUNTER — Other Ambulatory Visit: Payer: Self-pay | Admitting: Internal Medicine

## 2017-08-11 DIAGNOSIS — M1990 Unspecified osteoarthritis, unspecified site: Secondary | ICD-10-CM

## 2017-08-11 NOTE — Telephone Encounter (Signed)
Diclofenac 1percent gel,  To [facility] [phone], needs doc approval

## 2017-08-12 MED ORDER — DICLOFENAC SODIUM 1 % TD GEL: 4.0000 g | Freq: Four times a day (QID) | TRANSDERMAL | 6 refills | Status: DC | PRN

## 2017-08-13 ENCOUNTER — Telehealth: Payer: Self-pay | Admitting: *Deleted

## 2017-08-13 NOTE — Telephone Encounter (Signed)
Received faxed PA request from pt's pharmacy for diclofenac gel 1%.  ICD 10 code M19.90.  Request approved through 07/19/2018 ref code: [id number].  Pharmacy aware.[name]1/25/20192:52 PM

## 2017-08-30 DIAGNOSIS — F329 Major depressive disorder, single episode, unspecified: Secondary | ICD-10-CM | POA: Diagnosis not present

## 2017-09-09 ENCOUNTER — Other Ambulatory Visit: Payer: Self-pay | Admitting: Internal Medicine

## 2017-09-09 DIAGNOSIS — J441 Chronic obstructive pulmonary disease with (acute) exacerbation: Secondary | ICD-10-CM

## 2017-09-09 NOTE — Telephone Encounter (Signed)
[facility] requesting a refill on patient prescription.

## 2017-09-10 MED ORDER — TIOTROPIUM BROMIDE-OLODATEROL 2.5-2.5 MCG/ACT IN AERS
2.0000 | INHALATION_SPRAY | Freq: Every day | RESPIRATORY_TRACT | 3 refills | Status: DC
Start: 2017-09-10 — End: 2017-12-27

## 2017-09-10 NOTE — Telephone Encounter (Signed)
Refill Approved

## 2017-09-16 ENCOUNTER — Telehealth: Payer: Self-pay | Admitting: *Deleted

## 2017-09-16 NOTE — Telephone Encounter (Addendum)
Information was called to [place] for PA for Savaysa.  Information to be sent to Pharmacy for determination.  [place] [phone].  Sander Nephew, RN 09/16/2017 3:27 PM.  Fax from [place] information required.  Fax placed in Dr. Dessie Coma box for completion.  Sander Nephew, RN 09/17/2017 1:56 PM.

## 2017-09-17 DIAGNOSIS — F329 Major depressive disorder, single episode, unspecified: Secondary | ICD-10-CM | POA: Diagnosis not present

## 2017-09-27 ENCOUNTER — Other Ambulatory Visit: Payer: Self-pay | Admitting: Pharmacist

## 2017-09-27 ENCOUNTER — Other Ambulatory Visit: Payer: Self-pay | Admitting: Internal Medicine

## 2017-09-27 ENCOUNTER — Telehealth: Payer: Self-pay

## 2017-09-27 DIAGNOSIS — B20 Human immunodeficiency virus [HIV] disease: Secondary | ICD-10-CM

## 2017-09-27 NOTE — Telephone Encounter (Signed)
Patients wife is calling and states they are traveling and the bag with [place]'s medication was lost.  She is requesting refills of Genvoya, Prezista, lisinopril and metformin.   I will call the pharmacy to see if it is possible for a override for medications.  They will submit information and have suggested the patient call in one hour for status.  Wife was informed to call us back if medications are not able to be refilled.    Laverle Patter, RN

## 2017-09-27 NOTE — Telephone Encounter (Signed)
PATIENT NEEDS REFILL ON ALL HIS MEDICATIONS, HE LOST THEM ON A TRAIN RIDE, HE IS IN [place], [place] TILL October 31, 2017, CALL PT [phone]

## 2017-10-01 NOTE — Telephone Encounter (Signed)
rtc to pt, lm for rtc 

## 2017-10-04 NOTE — Telephone Encounter (Signed)
Lm for rtc 

## 2017-10-07 ENCOUNTER — Other Ambulatory Visit: Payer: Medicare Other

## 2017-10-12 ENCOUNTER — Other Ambulatory Visit: Payer: Self-pay | Admitting: Internal Medicine

## 2017-10-12 MED ORDER — RIVAROXABAN 20 MG PO TABS: 20.0000 mg | ORAL_TABLET | Freq: Every day | ORAL | 0 refills | Status: DC

## 2017-10-12 NOTE — Telephone Encounter (Signed)
Please call patient, medicine needs to change for his blood thinner

## 2017-10-12 NOTE — Telephone Encounter (Signed)
I started rivaroxaban bc once a day. Pls tell him to take savaysa until run out of pills then the next day, start rivaroxaban.   Appears to be on ASA for primary prevention. Would encourage PCP to ensure not being used for secondary prevention and discuss risks / benefits.

## 2017-10-12 NOTE — Telephone Encounter (Signed)
Pt states his insurance will not pay for savaysa anymore, needs to be changed to xarelto or eliquis Please advise

## 2017-10-12 NOTE — Telephone Encounter (Signed)
Patient is requesting refills on blood thinner medicine, pt is out of town. [facility] on [address], [place]

## 2017-10-12 NOTE — Telephone Encounter (Signed)
Called pt gave him instructions along with spouse

## 2017-10-21 ENCOUNTER — Ambulatory Visit: Payer: Medicare Other | Admitting: Internal Medicine

## 2017-10-25 ENCOUNTER — Ambulatory Visit (INDEPENDENT_AMBULATORY_CARE_PROVIDER_SITE_OTHER): Payer: Self-pay | Admitting: Podiatry

## 2017-10-25 DIAGNOSIS — B351 Tinea unguium: Secondary | ICD-10-CM | POA: Diagnosis not present

## 2017-10-25 DIAGNOSIS — E1142 Type 2 diabetes mellitus with diabetic polyneuropathy: Secondary | ICD-10-CM

## 2017-10-25 DIAGNOSIS — M79676 Pain in unspecified toe(s): Secondary | ICD-10-CM | POA: Diagnosis not present

## 2017-10-25 NOTE — Progress Notes (Signed)
   SUBJECTIVE Patient with a history of diabetes mellitus presents to office today complaining of elongated, thickened nails that cause pain while ambulating in shoes. He is unable to trim his own nails. Patient is here for further evaluation and treatment.   Past Medical History:  Diagnosis Date  . Calcium oxalate crystals in urine 12/23/2011   Asymptomatic, no hematuria. Advised to take plenty of water.  [name] Kitchen COPD (chronic obstructive pulmonary disease) ([place])   . Depression   . Diabetes mellitus without complication ([place]) 2707   pre diabetic  . Hepatitis C   . HIV (human immunodeficiency virus infection) ([place])   . Hypertension   . Peripheral arterial disease ([place])    ooccluded left SFA by duplex ultrasound, tibial disease left greater than right  . Stroke ([place]) 11/2011   Carotids Doppler negative. Right sided weakness resolved, initially on Plavix for 3 months and then continued with ASA.    . TB lung, latent [place] Patient is awake, alert, and oriented x 3 and in no acute distress. Derm Skin is dry and supple bilateral. Negative open lesions or macerations. Remaining integument unremarkable. Nails are tender, long, thickened and dystrophic with subungual debris, consistent with onychomycosis, 1-5 bilateral. No signs of infection noted. Vasc  DP and PT pedal pulses palpable bilaterally. Temperature gradient within normal limits.  Neuro Epicritic and protective threshold sensation diminished bilaterally.  Musculoskeletal Exam No symptomatic pedal deformities noted bilateral. Muscular strength within normal limits.  ASSESSMENT 1. Diabetes Mellitus w/ peripheral neuropathy 2. Onychomycosis of nail due to dermatophyte bilateral 3. Pain in foot bilateral  PLAN OF CARE 1. Patient evaluated today. 2. Instructed to maintain good pedal hygiene and foot care. Stressed importance of controlling blood sugar.  3. Mechanical debridement of nails 1-5 bilaterally  performed using a nail nipper. Filed with dremel without incident.  4. Return to clinic in 3 mos.     Edrick Kins, DPM [facility]  Dr. Edrick Kins, [address]                                        [place], [place] [address]                Office [phone]  Fax [phone]

## 2017-10-26 ENCOUNTER — Telehealth: Payer: Self-pay | Admitting: *Deleted

## 2017-10-26 ENCOUNTER — Other Ambulatory Visit: Payer: Self-pay | Admitting: *Deleted

## 2017-10-26 NOTE — Telephone Encounter (Signed)
Pt states he was walking from the [place] building from the the bus stop and was passed our driveway, and he stumbled and lay on the ground with his grandson with him. I checked pt's head and exposed arms and legs and asked if anything was hurt. There was no evidence of injury or broken skin and pt denied feeling bad or hurting. BP 113/79, P 20, R 20. I offered pt and grandson waters and cookies for the grandson, Jace. A. Prevette, CMA attended pt for debridement.

## 2017-10-27 MED ORDER — LISINOPRIL 5 MG PO TABS: 5.0000 mg | ORAL_TABLET | Freq: Every day | ORAL | 0 refills | Status: DC

## 2017-10-27 NOTE — Telephone Encounter (Signed)
Refill Approved

## 2017-11-02 ENCOUNTER — Telehealth: Payer: Self-pay

## 2017-11-02 DIAGNOSIS — M2012 Hallux valgus (acquired), left foot: Secondary | ICD-10-CM

## 2017-11-02 DIAGNOSIS — M2011 Hallux valgus (acquired), right foot: Secondary | ICD-10-CM

## 2017-11-02 DIAGNOSIS — E1142 Type 2 diabetes mellitus with diabetic polyneuropathy: Secondary | ICD-10-CM | POA: Diagnosis not present

## 2017-11-02 NOTE — Telephone Encounter (Signed)
I called patient to follow-up on new Xarelto prescription since switching from [facility]. Patient verbalizes that he takes Xarelto 20 mg once daily as instructed and has not missed any doses. Denies bleeding/bruising and side effects since starting Xarelto. He states that he is tolerating the medication well and has no trouble obtaining his meds.   I advised patient to contact clinic if he experiences new onset symptoms or if he has additional questions. Patient verbalized understanding.   Evangeline Gula, PharmD Candidate

## 2017-11-04 ENCOUNTER — Other Ambulatory Visit: Payer: Medicare Other

## 2017-11-04 DIAGNOSIS — B20 Human immunodeficiency virus [HIV] disease: Secondary | ICD-10-CM | POA: Diagnosis not present

## 2017-11-05 LAB — T-HELPER CELL (CD4) - (RCID CLINIC ONLY)
CD4 % Helper T Cell: 25 % — ABNORMAL LOW (ref 33–55)
CD4 T Cell Abs: 580 /uL (ref 400–2700)

## 2017-11-09 LAB — CBC
HEMATOCRIT: 30.8 % — AB (ref 38.5–50.0)
Hemoglobin: 10.2 g/dL — ABNORMAL LOW (ref 13.2–17.1)
MCH: 27.3 pg (ref 27.0–33.0)
MCHC: 33.1 g/dL (ref 32.0–36.0)
MCV: 82.4 fL (ref 80.0–100.0)
MPV: 11.5 fL (ref 7.5–12.5)
PLATELETS: 293 10*3/uL (ref 140–400)
RBC: 3.74 10*6/uL — AB (ref 4.20–5.80)
RDW: 12.7 % (ref 11.0–15.0)
WBC: 6.4 10*3/uL (ref 3.8–10.8)

## 2017-11-09 LAB — COMPREHENSIVE METABOLIC PANEL
AG RATIO: 1 (calc) (ref 1.0–2.5)
ALT: 10 U/L (ref 9–46)
AST: 14 U/L (ref 10–35)
Albumin: 4 g/dL (ref 3.6–5.1)
Alkaline phosphatase (APISO): 85 U/L (ref 40–115)
BUN / CREAT RATIO: 9 (calc) (ref 6–22)
BUN: 13 mg/dL (ref 7–25)
CO2: 21 mmol/L (ref 20–32)
CREATININE: 1.51 mg/dL — AB (ref 0.70–1.25)
Calcium: 9.3 mg/dL (ref 8.6–10.3)
Chloride: 106 mmol/L (ref 98–110)
GLOBULIN: 3.9 g/dL — AB (ref 1.9–3.7)
GLUCOSE: 167 mg/dL — AB (ref 65–99)
Potassium: 4.3 mmol/L (ref 3.5–5.3)
SODIUM: 137 mmol/L (ref 135–146)
TOTAL PROTEIN: 7.9 g/dL (ref 6.1–8.1)
Total Bilirubin: 0.2 mg/dL (ref 0.2–1.2)

## 2017-11-09 LAB — RPR: RPR: NONREACTIVE

## 2017-11-09 LAB — HIV-1 RNA QUANT-NO REFLEX-BLD
HIV 1 RNA QUANT: NOT DETECTED {copies}/mL
HIV-1 RNA QUANT, LOG: NOT DETECTED {Log_copies}/mL

## 2017-11-16 DIAGNOSIS — C61 Malignant neoplasm of prostate: Secondary | ICD-10-CM | POA: Diagnosis not present

## 2017-11-16 DIAGNOSIS — R232 Flushing: Secondary | ICD-10-CM | POA: Diagnosis not present

## 2017-11-17 ENCOUNTER — Other Ambulatory Visit: Payer: Self-pay | Admitting: Internal Medicine

## 2017-11-17 ENCOUNTER — Other Ambulatory Visit: Payer: Self-pay | Admitting: Behavioral Health

## 2017-11-17 DIAGNOSIS — B20 Human immunodeficiency virus [HIV] disease: Secondary | ICD-10-CM

## 2017-11-17 DIAGNOSIS — R296 Repeated falls: Secondary | ICD-10-CM | POA: Diagnosis not present

## 2017-11-17 MED ORDER — DARUNAVIR ETHANOLATE 800 MG PO TABS: ORAL_TABLET | ORAL | 2 refills | Status: DC

## 2017-11-17 MED ORDER — ELVITEG-COBIC-EMTRICIT-TENOFAF 150-150-200-10 MG PO TABS: 1.0000 | ORAL_TABLET | Freq: Every day | ORAL | 2 refills | Status: DC

## 2017-11-18 ENCOUNTER — Ambulatory Visit (INDEPENDENT_AMBULATORY_CARE_PROVIDER_SITE_OTHER): Payer: Medicare HMO | Admitting: Internal Medicine

## 2017-11-18 ENCOUNTER — Other Ambulatory Visit: Payer: Self-pay | Admitting: *Deleted

## 2017-11-18 ENCOUNTER — Encounter: Payer: Self-pay | Admitting: Internal Medicine

## 2017-11-18 DIAGNOSIS — B20 Human immunodeficiency virus [HIV] disease: Secondary | ICD-10-CM | POA: Diagnosis not present

## 2017-11-18 DIAGNOSIS — R296 Repeated falls: Secondary | ICD-10-CM | POA: Diagnosis not present

## 2017-11-18 NOTE — Assessment & Plan Note (Signed)
His infection has been under very good, long-term control.  He will continue his current regimen and follow-up after lab work in 6 months.

## 2017-11-18 NOTE — Progress Notes (Signed)
Patient Active Problem List   Diagnosis Date Noted  . Human immunodeficiency virus (HIV) disease ([place]) 04/27/2006    Priority: High  . Recurrent falls 11/02/2016  . AKI (acute kidney injury) ([place]) 09/24/2016  . History of pulmonary embolus (PE) 09/22/2016  . History of prostate cancer 09/22/2016  . BPH associated with nocturia 06/19/2016  . PE (pulmonary thromboembolism) ([place]) 05/12/2016  . Substance abuse ([place]) 01/27/2016  . Vitamin D deficiency 12/05/2015  . Hyperlipidemia 01/28/2015  . Cataracts, bilateral 08/29/2014  . Diabetes mellitus type 2 with complications ([place]) 53/20/2334  . COPD, moderate ([place]) 05/31/2013  . Complex Lt Renal Cyst 03/22/2013  . Preventive measure 05/30/2012  . History of CVA 03/08/2012  . Normocytic anemia 03/08/2012  . CKD Stage 2 03/08/2012  . ERECTILE DYSFUNCTION 04/25/2008  . Smoking greater than 30 pack years 04/27/2006  . Major depressive disorder, recurrent episode ([place]) 04/27/2006  . Essential hypertension 04/27/2006  . DEGENERATIVE JOINT DISEASE 04/27/2006  . Chronic Low Back Pain 04/27/2006    Patient's Medications  New Prescriptions   No medications on file  Previous Medications   ALBUTEROL (PROAIR HFA) 108 (90 BASE) MCG/ACT INHALER    INHALE 2 PUFFS INTO THE LUNGS EVERY 6 HOURS AS NEEDED FOR WHEEZING OR SHORTNESS OF BREATH, TO NOT USE EVERY DAY, USE ONLY IF NEEDED   ALBUTEROL (PROVENTIL) (2.5 MG/3ML) 0.083% NEBULIZER SOLUTION    Take 3 mLs (2.5 mg total) by nebulization every 6 (six) hours as needed for wheezing or shortness of breath.   ASPIRIN 81 MG EC TABLET    Take 1 tablet (81 mg total) by mouth daily.   AZELASTINE (ASTELIN) 0.1 % NASAL SPRAY    Place 1 spray into both nostrils 2 (two) times daily. Use in each nostril as directed   BLOOD GLUCOSE MONITORING SUPPL (ONETOUCH VERIO) W/DEVICE KIT    1 Device by Does not apply route 2 (two) times daily. ICD 10  E11.8, non-insulin dependent, test up to twice daily   BUDESONIDE 90 MCG/ACT INHALER    Inhale 1-2 puffs into the lungs 2 (two) times daily.   CHOLECALCIFEROL (VITAMIN D) 1000 UNITS TABLET    Take 1 tablet (1,000 Units total) by mouth daily.   CITALOPRAM (CELEXA) 20 MG TABLET    Take 20 mg by mouth daily.   DARUNAVIR (PREZISTA) 800 MG TABLET    TAKE 1 TABLET BY MOUTH EVERY DAY WITH BREAKFAST   DICLOFENAC SODIUM (VOLTAREN) 1 % GEL    Apply 4 g topically 4 (four) times daily as needed (pain).   EDOXABAN (SAVAYSA) 60 MG TABS TABLET    Take 60 mg by mouth daily.   ELVITEGRAVIR-COBICISTAT-EMTRICITABINE-TENOFOVIR (GENVOYA) 150-150-200-10 MG TABS TABLET    Take 1 tablet by mouth daily with breakfast.   GABAPENTIN (NEURONTIN) 300 MG CAPSULE    Take 2 capsules (600 mg total) by mouth at bedtime.   GLUCOSE BLOOD (ONETOUCH VERIO) TEST STRIP    Use as instructed ICD 10  E11.8, non-insulin dependent, test up to twice daily   GUAIFENESIN-DEXTROMETHORPHAN (ROBITUSSIN DM) 100-10 MG/5ML SYRUP    Take 5 mLs by mouth every 4 (four) hours as needed for cough.   HYDROXYPROPYL METHYLCELLULOSE / HYPROMELLOSE (ISOPTO TEARS / GONIOVISC) 2.5 % OPHTHALMIC SOLUTION    Place 1 drop into both eyes as needed for dry eyes.   LISINOPRIL (PRINIVIL,ZESTRIL) 5 MG TABLET    Take 1 tablet (5 mg total) by mouth daily.   METFORMIN (  GLUCOPHAGE) 500 MG TABLET    TAKE 1 TABLET BY MOUTH EVERY DAY WITH BREAKFAST   METFORMIN (GLUCOPHAGE) 500 MG TABLET    TAKE 1 TABLET BY MOUTH EVERY DAY WITH BREAKFAST   MISC. DEVICES (ROLLATOR) MISC    1 rollator   NICOTINE (NICODERM CQ - DOSED IN MG/24 HR) 7 MG/24HR PATCH    Apply 1 patch, change each day, remove at night if sleep disturbance. MEDICAID [id number] L, MEDICARE A AND B [id number] A   NICOTINE POLACRILEX (COMMIT) 2 MG LOZENGE    Use 1 lozenge by mouth every 2-4 hours as needed. Max 20 lozenges per day.   ONETOUCH DELICA LANCETS FINE MISC    1 Stick by Does not apply route 2 (two) times daily. ICD 10  E11.8, non-insulin dependent, test up to twice daily    PRAVASTATIN (PRAVACHOL) 40 MG TABLET    TAKE 1 TABLET BY MOUTH DAILY   QUETIAPINE (SEROQUEL) 300 MG TABLET    Take 300 mg by mouth 2 (two) times daily.    RIVAROXABAN (XARELTO) 20 MG TABS TABLET    Take 1 tablet (20 mg total) by mouth daily with supper.   TAMSULOSIN (FLOMAX) 0.4 MG CAPS CAPSULE    TAKE 1 CAPSULE(0.4 MG) BY MOUTH DAILY AFTER BREAKFAST   TIOTROPIUM BROMIDE-OLODATEROL (STIOLTO RESPIMAT) 2.5-2.5 MCG/ACT AERS    Inhale 2 puffs into the lungs daily.   VALACYCLOVIR (VALTREX) 500 MG TABLET    TAKE 1 TABLET BY MOUTH DAILY  Modified Medications   No medications on file  Discontinued Medications   No medications on file    Subjective: Jacob Joseph is in for his routine HIV follow-up visit.  He denies any recent problems.  He has had no problems obtaining, taking or tolerating his Genvoya or Prezista.  He denies missing doses.  He denies any recent cocaine use.  Review of Systems: Review of Systems  Constitutional: Negative for chills, diaphoresis and fever.  Respiratory: Negative for cough.   Cardiovascular: Negative for chest pain.  Gastrointestinal: Negative for diarrhea, nausea and vomiting.  Psychiatric/Behavioral: Negative for substance abuse.    Past Medical History:  Diagnosis Date  . Calcium oxalate crystals in urine 12/23/2011   Asymptomatic, no hematuria. Advised to take plenty of water.  [name] Kitchen COPD (chronic obstructive pulmonary disease) ([place])   . Depression   . Diabetes mellitus without complication ([place]) 6073   pre diabetic  . Hepatitis C   . HIV (human immunodeficiency virus infection) ([place])   . Hypertension   . Peripheral arterial disease ([place])    ooccluded left SFA by duplex ultrasound, tibial disease left greater than right  . Stroke ([place]) 11/2011   Carotids Doppler negative. Right sided weakness resolved, initially on Plavix for 3 months and then continued with ASA.    . TB lung, latent 1988   Treated    Social History   Tobacco Use  . Smoking status: Former  Smoker    Packs/day: 0.15    Types: Cigarettes    Last attempt to quit: 11/18/2014    Years since quitting: 3.0  . Smokeless tobacco: Never Used  . Tobacco comment: ~ 20 pack years  Substance Use Topics  . Alcohol use: No    Alcohol/week: 0.0 oz  . Drug use: Yes    Frequency: 3.0 times per week    Types: "Crack" cocaine, Cocaine, Marijuana    Family History  Problem Relation Age of Onset  . Hypertension Mother   . Heart attack Mother   .  Stroke Mother   . Hypertension Father   . Cancer Father     No Known Allergies  Health Maintenance  Topic Date Due  . OPHTHALMOLOGY EXAM  06/19/2017  . LIPID PANEL  08/24/2017  . HEMOGLOBIN A1C  09/16/2017  . FOOT EXAM  02/01/2018  . INFLUENZA VACCINE  02/17/2018  . TETANUS/TDAP  05/30/2022  . COLONOSCOPY  05/17/2023  . PNEUMOCOCCAL POLYSACCHARIDE VACCINE  Completed  . Hepatitis C Screening  Completed  . HIV Screening  Completed    Objective:  Vitals:   11/18/17 1028  BP: 109/77  Pulse: (!) 105  Temp: 98.2 F (36.8 C)  TempSrc: Oral  Weight: 225 lb (102.1 kg)  Height: 6' (1.829 m)   Body mass index is 30.52 kg/m.  Physical Exam  Constitutional: He is oriented to person, place, and time.  He is in good spirits.  He is accompanied by his wife and 3 grandchildren.  HENT:  Mouth/Throat: No oropharyngeal exudate.  Eyes: Conjunctivae are normal.  Cardiovascular: Normal rate and regular rhythm.  No murmur heard. Pulmonary/Chest: Effort normal and breath sounds normal.  Abdominal: Soft. He exhibits no mass. There is no tenderness.  Musculoskeletal: Normal range of motion.  Neurological: He is alert and oriented to person, place, and time.  He walks with the aid of his cane.  Skin: No rash noted.  Psychiatric: He has a normal mood and affect.    Lab Results Lab Results  Component Value Date   WBC 6.4 11/04/2017   HGB 10.2 (L) 11/04/2017   HCT 30.8 (L) 11/04/2017   MCV 82.4 11/04/2017   PLT 293 11/04/2017    Lab  Results  Component Value Date   CREATININE 1.51 (H) 11/04/2017   BUN 13 11/04/2017   NA 137 11/04/2017   K 4.3 11/04/2017   CL 106 11/04/2017   CO2 21 11/04/2017    Lab Results  Component Value Date   ALT 10 11/04/2017   AST 14 11/04/2017   ALKPHOS 76 09/21/2016   BILITOT 0.2 11/04/2017    Lab Results  Component Value Date   CHOL 119 08/24/2016   HDL 39 (L) 08/24/2016   LDLCALC 52 08/24/2016   TRIG 142 08/24/2016   CHOLHDL 3.1 08/24/2016   Lab Results  Component Value Date   LABRPR NON-REACTIVE 11/04/2017   HIV 1 RNA Quant (copies/mL)  Date Value  11/04/2017 <20 NOT DETECTED  03/26/2017 <20 DETECTED (A)  08/24/2016 <20 DETECTED (A)   CD4 T Cell Abs (/uL)  Date Value  11/04/2017 580  03/26/2017 500  08/24/2016 680     Problem List Items Addressed This Visit      High   Human immunodeficiency virus (HIV) disease ([place]) (Chronic)    His infection has been under very good, long-term control.  He will continue his current regimen and follow-up after lab work in 6 months.      Relevant Orders   T-helper cell (CD4)- ([facility] clinic only)   HIV 1 RNA quant-no reflex-bld        Michel Bickers, MD [facility] for [place] [phone] pager   [phone] cell 11/18/2017, 11:02 AM

## 2017-11-19 DIAGNOSIS — R296 Repeated falls: Secondary | ICD-10-CM | POA: Diagnosis not present

## 2017-11-19 MED ORDER — TAMSULOSIN HCL 0.4 MG PO CAPS: ORAL_CAPSULE | ORAL | 3 refills | Status: DC

## 2017-11-19 NOTE — Telephone Encounter (Signed)
Refill Approved

## 2017-11-22 ENCOUNTER — Other Ambulatory Visit: Payer: Self-pay | Admitting: Internal Medicine

## 2017-11-22 DIAGNOSIS — R296 Repeated falls: Secondary | ICD-10-CM | POA: Diagnosis not present

## 2017-11-22 NOTE — Telephone Encounter (Signed)
90 tabs sent to [facility] on 10/27/2017. Pharmacist made aware. Hubbard Hartshorn, RN, BSN

## 2017-11-22 NOTE — Telephone Encounter (Signed)
Jacob Joseph with [facility] want to check what is the status on lisinopril (PRINIVIL,ZESTRIL) 5 MG tablet. Please call back.

## 2017-11-23 DIAGNOSIS — R296 Repeated falls: Secondary | ICD-10-CM | POA: Diagnosis not present

## 2017-11-24 DIAGNOSIS — R296 Repeated falls: Secondary | ICD-10-CM | POA: Diagnosis not present

## 2017-11-25 DIAGNOSIS — R296 Repeated falls: Secondary | ICD-10-CM | POA: Diagnosis not present

## 2017-11-26 DIAGNOSIS — R296 Repeated falls: Secondary | ICD-10-CM | POA: Diagnosis not present

## 2017-11-29 DIAGNOSIS — C61 Malignant neoplasm of prostate: Secondary | ICD-10-CM | POA: Diagnosis not present

## 2017-11-29 DIAGNOSIS — R296 Repeated falls: Secondary | ICD-10-CM | POA: Diagnosis not present

## 2017-11-29 DIAGNOSIS — R32 Unspecified urinary incontinence: Secondary | ICD-10-CM | POA: Diagnosis not present

## 2017-11-30 DIAGNOSIS — R296 Repeated falls: Secondary | ICD-10-CM | POA: Diagnosis not present

## 2017-11-30 DIAGNOSIS — J449 Chronic obstructive pulmonary disease, unspecified: Secondary | ICD-10-CM | POA: Diagnosis not present

## 2017-12-01 DIAGNOSIS — J449 Chronic obstructive pulmonary disease, unspecified: Secondary | ICD-10-CM | POA: Diagnosis not present

## 2017-12-01 DIAGNOSIS — R296 Repeated falls: Secondary | ICD-10-CM | POA: Diagnosis not present

## 2017-12-02 ENCOUNTER — Ambulatory Visit: Payer: Medicare HMO | Admitting: Orthotics

## 2017-12-02 DIAGNOSIS — R296 Repeated falls: Secondary | ICD-10-CM | POA: Diagnosis not present

## 2017-12-03 DIAGNOSIS — R296 Repeated falls: Secondary | ICD-10-CM | POA: Diagnosis not present

## 2017-12-06 DIAGNOSIS — R296 Repeated falls: Secondary | ICD-10-CM | POA: Diagnosis not present

## 2017-12-07 DIAGNOSIS — R296 Repeated falls: Secondary | ICD-10-CM | POA: Diagnosis not present

## 2017-12-08 DIAGNOSIS — R296 Repeated falls: Secondary | ICD-10-CM | POA: Diagnosis not present

## 2017-12-09 ENCOUNTER — Encounter: Payer: Self-pay | Admitting: Internal Medicine

## 2017-12-09 ENCOUNTER — Other Ambulatory Visit: Payer: Self-pay

## 2017-12-09 ENCOUNTER — Ambulatory Visit (INDEPENDENT_AMBULATORY_CARE_PROVIDER_SITE_OTHER): Payer: Medicare HMO | Admitting: Internal Medicine

## 2017-12-09 VITALS — BP 109/75 | HR 109 | Temp 98.7°F | Wt 221.3 lb

## 2017-12-09 DIAGNOSIS — E118 Type 2 diabetes mellitus with unspecified complications: Secondary | ICD-10-CM | POA: Diagnosis not present

## 2017-12-09 DIAGNOSIS — Z7951 Long term (current) use of inhaled steroids: Secondary | ICD-10-CM | POA: Diagnosis not present

## 2017-12-09 DIAGNOSIS — E119 Type 2 diabetes mellitus without complications: Secondary | ICD-10-CM | POA: Diagnosis not present

## 2017-12-09 DIAGNOSIS — M545 Low back pain: Secondary | ICD-10-CM | POA: Diagnosis not present

## 2017-12-09 DIAGNOSIS — Z7984 Long term (current) use of oral hypoglycemic drugs: Secondary | ICD-10-CM

## 2017-12-09 DIAGNOSIS — R296 Repeated falls: Secondary | ICD-10-CM | POA: Diagnosis not present

## 2017-12-09 DIAGNOSIS — G8929 Other chronic pain: Secondary | ICD-10-CM | POA: Diagnosis not present

## 2017-12-09 DIAGNOSIS — J449 Chronic obstructive pulmonary disease, unspecified: Secondary | ICD-10-CM

## 2017-12-09 DIAGNOSIS — M5137 Other intervertebral disc degeneration, lumbosacral region: Secondary | ICD-10-CM

## 2017-12-09 DIAGNOSIS — Z79899 Other long term (current) drug therapy: Secondary | ICD-10-CM

## 2017-12-09 DIAGNOSIS — I10 Essential (primary) hypertension: Secondary | ICD-10-CM

## 2017-12-09 LAB — POCT GLYCOSYLATED HEMOGLOBIN (HGB A1C): Hemoglobin A1C: 6.6 % — AB (ref 4.0–5.6)

## 2017-12-09 LAB — GLUCOSE, CAPILLARY: GLUCOSE-CAPILLARY: 171 mg/dL — AB (ref 65–99)

## 2017-12-09 MED ORDER — METFORMIN HCL 500 MG PO TABS: ORAL_TABLET | ORAL | 1 refills | Status: DC

## 2017-12-09 NOTE — Assessment & Plan Note (Addendum)
Patient has a history of chronic back pain since 1990's. He states that the pain intermittently flares up and has been flaring up for the past 4 days. He has tried his usual interventions of heating pad, ASA, Ibuprofen, Voltaren gel, and Lidocaine patches with some relief, but not enough per patient, to function normally. His last CMP in April showed Cr elevated to ~1.5 making higher dose NSAIDs such as Naproxen a poor choice and I do not want to start any opioids considering this is a pain that comes and goes and the risks outweigh the benefits especially with his history of broken pain contract. Patient exhibits tenderness to palpation of lumbar spine. - Schedule Voltaren Gel and Ibuprofen q6h for 3-5 days until pain is relieved and then spread out to as needed from there

## 2017-12-09 NOTE — Assessment & Plan Note (Addendum)
BP today 109/75. With blood pressure on the softer side, taking only Lisinopril 5mg  Daily, Reports of lightheadedness and worse upon standing from a seated position; will discontinue lisinopril at this time. Patient does not have CAD or CHF as alternative indication for Lisinopril. - Lifestyle modifications

## 2017-12-09 NOTE — Assessment & Plan Note (Signed)
Patient has a history of well controlled COPD. Currently taking Stiolto and Budesonide. Patient denies and shortness of breath or exacerbations. Will continue current regimen. - Tiotropium-Olodaterol (STIOLTO) 2.5-2.5, 2 puff Daily - Budesonide, 1-2 puffs BID - Albuterol PRN

## 2017-12-09 NOTE — Assessment & Plan Note (Addendum)
Hgb A1c today 6.6; increased from previous A1c of 6.1. A1c was previously stable around 6.1-6.2. As this is patient's first increase in A1c in some time and was just a .5 increase will continue current regimen or now and increase Metformin to BID if A1c continues to trend up at next visit. - Metformin 500mg  Daily

## 2017-12-09 NOTE — Progress Notes (Signed)
   CC: Hypertension, Diabetes, COPD, Chronic Back Pain  HPI:  [name] is a 62 y.o. M with PMHx listed below presenting for Hypertension, Diabetes, COPD, Chronic Back Pain. Please see the A&P for the status of the patient's chronic medical problems.   Past Medical History:  Diagnosis Date  . Calcium oxalate crystals in urine 12/23/2011   Asymptomatic, no hematuria. Advised to take plenty of water.  [name] Kitchen COPD (chronic obstructive pulmonary disease) ([place])   . Depression   . Diabetes mellitus without complication ([place]) 7209   pre diabetic  . Hepatitis C   . HIV (human immunodeficiency virus infection) ([place])   . Hypertension   . Peripheral arterial disease ([place])    ooccluded left SFA by duplex ultrasound, tibial disease left greater than right  . Stroke ([place]) 11/2011   Carotids Doppler negative. Right sided weakness resolved, initially on Plavix for 3 months and then continued with ASA.    . TB lung, latent 1988   Treated   Review of Systems:  Performed and all others negative.  Physical Exam:  Vitals:   12/09/17 1349  BP: 109/75  Pulse: (!) 109  Temp: 98.7 F (37.1 C)  SpO2: 99%  Weight: 221 lb 4.8 oz (100.4 kg)   Physical Exam  Constitutional: He appears well-developed and well-nourished.  Cardiovascular: Regular rhythm, normal heart sounds and intact distal pulses.  Pulmonary/Chest: Effort normal. No respiratory distress.  Trace wheezing at bilateral apicies  Abdominal: Soft. Bowel sounds are normal. He exhibits no distension. There is no tenderness.  Musculoskeletal: He exhibits no edema or deformity.  Tenderness to palpation over lumbar spine  Skin: Skin is warm and dry.   Assessment & Plan:   See Encounters Tab for problem based charting.  Patient discussed with Dr. Beryle Beams

## 2017-12-09 NOTE — Patient Instructions (Addendum)
Thank you for allowing [place] to care for you  For your high blood pressure - Well controlled today, a little lowe than normal - With your symptoms of lightheadedness will stop your lisinopril  For your diabetes: - A1c 6.6 today, up from 6.1 last visit - Continue current medications - If A1c continues to go up we will adjust medications  Your COPD is stable, continue to take current medications  For your Back Pain - Continue home therapies - For the next 3-5 days, schedule your Ibuprofen and Voltaren gel every 6 hours, the spread it out after than  Please follow up in 3 months

## 2017-12-10 DIAGNOSIS — R296 Repeated falls: Secondary | ICD-10-CM | POA: Diagnosis not present

## 2017-12-10 NOTE — Progress Notes (Signed)
Medicine attending: Medical history, presenting problems, physical findings, and medications, reviewed with resident physician Dr Gwenette Greet on the day of the patient visit and I concur with his evaluation and management plan. We will allow short term use of NSAIDs prn for flare ups of his chronic back pain. Continue topical preps.

## 2017-12-13 DIAGNOSIS — R296 Repeated falls: Secondary | ICD-10-CM | POA: Diagnosis not present

## 2017-12-14 DIAGNOSIS — R296 Repeated falls: Secondary | ICD-10-CM | POA: Diagnosis not present

## 2017-12-15 ENCOUNTER — Other Ambulatory Visit: Payer: Self-pay | Admitting: Internal Medicine

## 2017-12-15 DIAGNOSIS — R296 Repeated falls: Secondary | ICD-10-CM | POA: Diagnosis not present

## 2017-12-15 DIAGNOSIS — A6000 Herpesviral infection of urogenital system, unspecified: Secondary | ICD-10-CM

## 2017-12-16 ENCOUNTER — Encounter: Payer: Medicare Other | Admitting: Internal Medicine

## 2017-12-16 DIAGNOSIS — R296 Repeated falls: Secondary | ICD-10-CM | POA: Diagnosis not present

## 2017-12-16 NOTE — Telephone Encounter (Signed)
Refill approved.

## 2017-12-17 DIAGNOSIS — R296 Repeated falls: Secondary | ICD-10-CM | POA: Diagnosis not present

## 2017-12-20 DIAGNOSIS — R296 Repeated falls: Secondary | ICD-10-CM | POA: Diagnosis not present

## 2017-12-21 DIAGNOSIS — R296 Repeated falls: Secondary | ICD-10-CM | POA: Diagnosis not present

## 2017-12-22 DIAGNOSIS — R296 Repeated falls: Secondary | ICD-10-CM | POA: Diagnosis not present

## 2017-12-23 DIAGNOSIS — R296 Repeated falls: Secondary | ICD-10-CM | POA: Diagnosis not present

## 2017-12-24 DIAGNOSIS — R296 Repeated falls: Secondary | ICD-10-CM | POA: Diagnosis not present

## 2017-12-27 ENCOUNTER — Other Ambulatory Visit: Payer: Self-pay | Admitting: Pharmacist

## 2017-12-27 DIAGNOSIS — J441 Chronic obstructive pulmonary disease with (acute) exacerbation: Secondary | ICD-10-CM

## 2017-12-27 DIAGNOSIS — R296 Repeated falls: Secondary | ICD-10-CM | POA: Diagnosis not present

## 2017-12-27 MED ORDER — TIOTROPIUM BROMIDE-OLODATEROL 2.5-2.5 MCG/ACT IN AERS
2.0000 | INHALATION_SPRAY | Freq: Every day | RESPIRATORY_TRACT | 3 refills | Status: DC
Start: 2017-12-27 — End: 2018-09-03

## 2017-12-27 NOTE — Telephone Encounter (Signed)
Refill approved.

## 2017-12-28 DIAGNOSIS — R296 Repeated falls: Secondary | ICD-10-CM | POA: Diagnosis not present

## 2017-12-29 DIAGNOSIS — R296 Repeated falls: Secondary | ICD-10-CM | POA: Diagnosis not present

## 2017-12-30 DIAGNOSIS — R296 Repeated falls: Secondary | ICD-10-CM | POA: Diagnosis not present

## 2017-12-31 DIAGNOSIS — J449 Chronic obstructive pulmonary disease, unspecified: Secondary | ICD-10-CM | POA: Diagnosis not present

## 2017-12-31 DIAGNOSIS — R296 Repeated falls: Secondary | ICD-10-CM | POA: Diagnosis not present

## 2018-01-01 DIAGNOSIS — J449 Chronic obstructive pulmonary disease, unspecified: Secondary | ICD-10-CM | POA: Diagnosis not present

## 2018-01-03 DIAGNOSIS — R296 Repeated falls: Secondary | ICD-10-CM | POA: Diagnosis not present

## 2018-01-04 DIAGNOSIS — R296 Repeated falls: Secondary | ICD-10-CM | POA: Diagnosis not present

## 2018-01-05 DIAGNOSIS — R296 Repeated falls: Secondary | ICD-10-CM | POA: Diagnosis not present

## 2018-01-06 DIAGNOSIS — R296 Repeated falls: Secondary | ICD-10-CM | POA: Diagnosis not present

## 2018-01-07 DIAGNOSIS — R296 Repeated falls: Secondary | ICD-10-CM | POA: Diagnosis not present

## 2018-01-09 ENCOUNTER — Other Ambulatory Visit: Payer: Self-pay | Admitting: Internal Medicine

## 2018-01-10 DIAGNOSIS — R296 Repeated falls: Secondary | ICD-10-CM | POA: Diagnosis not present

## 2018-01-11 DIAGNOSIS — R296 Repeated falls: Secondary | ICD-10-CM | POA: Diagnosis not present

## 2018-01-12 ENCOUNTER — Other Ambulatory Visit: Payer: Self-pay | Admitting: Internal Medicine

## 2018-01-12 DIAGNOSIS — R296 Repeated falls: Secondary | ICD-10-CM | POA: Diagnosis not present

## 2018-01-12 MED ORDER — PRAVASTATIN SODIUM 40 MG PO TABS: 40.0000 mg | ORAL_TABLET | Freq: Every day | ORAL | 1 refills | Status: DC

## 2018-01-12 NOTE — Telephone Encounter (Signed)
Pls call [facility] checking on a refill request submit for Pravachol   pls call pharmacy if you have any questions

## 2018-01-13 DIAGNOSIS — R296 Repeated falls: Secondary | ICD-10-CM | POA: Diagnosis not present

## 2018-01-14 DIAGNOSIS — R296 Repeated falls: Secondary | ICD-10-CM | POA: Diagnosis not present

## 2018-01-17 DIAGNOSIS — R296 Repeated falls: Secondary | ICD-10-CM | POA: Diagnosis not present

## 2018-01-18 DIAGNOSIS — R296 Repeated falls: Secondary | ICD-10-CM | POA: Diagnosis not present

## 2018-01-19 DIAGNOSIS — R296 Repeated falls: Secondary | ICD-10-CM | POA: Diagnosis not present

## 2018-01-20 DIAGNOSIS — R296 Repeated falls: Secondary | ICD-10-CM | POA: Diagnosis not present

## 2018-01-21 DIAGNOSIS — R296 Repeated falls: Secondary | ICD-10-CM | POA: Diagnosis not present

## 2018-01-24 ENCOUNTER — Ambulatory Visit: Payer: Medicare Other | Admitting: Podiatry

## 2018-01-24 DIAGNOSIS — R296 Repeated falls: Secondary | ICD-10-CM | POA: Diagnosis not present

## 2018-01-25 ENCOUNTER — Other Ambulatory Visit: Payer: Self-pay | Admitting: Internal Medicine

## 2018-01-25 DIAGNOSIS — R296 Repeated falls: Secondary | ICD-10-CM | POA: Diagnosis not present

## 2018-01-25 DIAGNOSIS — B20 Human immunodeficiency virus [HIV] disease: Secondary | ICD-10-CM

## 2018-01-25 DIAGNOSIS — M1990 Unspecified osteoarthritis, unspecified site: Secondary | ICD-10-CM

## 2018-01-25 DIAGNOSIS — E118 Type 2 diabetes mellitus with unspecified complications: Secondary | ICD-10-CM

## 2018-01-25 MED ORDER — TAMSULOSIN HCL 0.4 MG PO CAPS: ORAL_CAPSULE | ORAL | 0 refills | Status: DC

## 2018-01-25 MED ORDER — METFORMIN HCL 500 MG PO TABS: ORAL_TABLET | ORAL | 0 refills | Status: DC

## 2018-01-25 NOTE — Telephone Encounter (Signed)
Refills approved.

## 2018-01-26 ENCOUNTER — Encounter: Payer: Self-pay | Admitting: Podiatry

## 2018-01-26 ENCOUNTER — Ambulatory Visit (INDEPENDENT_AMBULATORY_CARE_PROVIDER_SITE_OTHER): Payer: Medicare HMO | Admitting: Podiatry

## 2018-01-26 DIAGNOSIS — M79676 Pain in unspecified toe(s): Secondary | ICD-10-CM | POA: Diagnosis not present

## 2018-01-26 DIAGNOSIS — F329 Major depressive disorder, single episode, unspecified: Secondary | ICD-10-CM | POA: Diagnosis not present

## 2018-01-26 DIAGNOSIS — B351 Tinea unguium: Secondary | ICD-10-CM | POA: Diagnosis not present

## 2018-01-26 DIAGNOSIS — R296 Repeated falls: Secondary | ICD-10-CM | POA: Diagnosis not present

## 2018-01-26 DIAGNOSIS — E1142 Type 2 diabetes mellitus with diabetic polyneuropathy: Secondary | ICD-10-CM | POA: Diagnosis not present

## 2018-01-27 DIAGNOSIS — R296 Repeated falls: Secondary | ICD-10-CM | POA: Diagnosis not present

## 2018-01-27 NOTE — Progress Notes (Signed)
Subjective:   Patient ID: Jacob Joseph, male   DOB: 63 y.o.   MRN: [id number]   HPI Patient presents with significant elongated nailbeds 1-5 both feet that he cannot take care of and are painful   ROS      Objective:  Physical Exam  Long-term diabetic with thick yellow brittle nailbeds 1-5 both feet that are painful     Assessment:  Mycotic nail infection with pain 1-5 both feet     Plan:  Debridement of painful nailbeds 1-5 both feet with no iatrogenic bleeding noted

## 2018-01-28 DIAGNOSIS — R296 Repeated falls: Secondary | ICD-10-CM | POA: Diagnosis not present

## 2018-01-30 DIAGNOSIS — J449 Chronic obstructive pulmonary disease, unspecified: Secondary | ICD-10-CM | POA: Diagnosis not present

## 2018-01-31 DIAGNOSIS — R296 Repeated falls: Secondary | ICD-10-CM | POA: Diagnosis not present

## 2018-02-01 DIAGNOSIS — R296 Repeated falls: Secondary | ICD-10-CM | POA: Diagnosis not present

## 2018-02-02 DIAGNOSIS — R296 Repeated falls: Secondary | ICD-10-CM | POA: Diagnosis not present

## 2018-02-03 DIAGNOSIS — R296 Repeated falls: Secondary | ICD-10-CM | POA: Diagnosis not present

## 2018-02-04 DIAGNOSIS — R296 Repeated falls: Secondary | ICD-10-CM | POA: Diagnosis not present

## 2018-02-07 DIAGNOSIS — R296 Repeated falls: Secondary | ICD-10-CM | POA: Diagnosis not present

## 2018-02-08 DIAGNOSIS — R296 Repeated falls: Secondary | ICD-10-CM | POA: Diagnosis not present

## 2018-02-09 DIAGNOSIS — R296 Repeated falls: Secondary | ICD-10-CM | POA: Diagnosis not present

## 2018-02-10 DIAGNOSIS — R296 Repeated falls: Secondary | ICD-10-CM | POA: Diagnosis not present

## 2018-02-11 DIAGNOSIS — R296 Repeated falls: Secondary | ICD-10-CM | POA: Diagnosis not present

## 2018-02-14 DIAGNOSIS — R296 Repeated falls: Secondary | ICD-10-CM | POA: Diagnosis not present

## 2018-02-15 DIAGNOSIS — R296 Repeated falls: Secondary | ICD-10-CM | POA: Diagnosis not present

## 2018-02-16 DIAGNOSIS — R296 Repeated falls: Secondary | ICD-10-CM | POA: Diagnosis not present

## 2018-02-17 DIAGNOSIS — R296 Repeated falls: Secondary | ICD-10-CM | POA: Diagnosis not present

## 2018-02-18 DIAGNOSIS — R296 Repeated falls: Secondary | ICD-10-CM | POA: Diagnosis not present

## 2018-02-21 DIAGNOSIS — R296 Repeated falls: Secondary | ICD-10-CM | POA: Diagnosis not present

## 2018-02-22 DIAGNOSIS — R296 Repeated falls: Secondary | ICD-10-CM | POA: Diagnosis not present

## 2018-02-23 DIAGNOSIS — R296 Repeated falls: Secondary | ICD-10-CM | POA: Diagnosis not present

## 2018-02-24 DIAGNOSIS — R296 Repeated falls: Secondary | ICD-10-CM | POA: Diagnosis not present

## 2018-02-25 DIAGNOSIS — R1011 Right upper quadrant pain: Secondary | ICD-10-CM | POA: Diagnosis not present

## 2018-02-25 DIAGNOSIS — M859 Disorder of bone density and structure, unspecified: Secondary | ICD-10-CM | POA: Diagnosis not present

## 2018-02-25 DIAGNOSIS — K8000 Calculus of gallbladder with acute cholecystitis without obstruction: Secondary | ICD-10-CM | POA: Diagnosis not present

## 2018-02-25 DIAGNOSIS — R296 Repeated falls: Secondary | ICD-10-CM | POA: Diagnosis not present

## 2018-02-26 ENCOUNTER — Emergency Department (HOSPITAL_COMMUNITY): Payer: Medicare HMO

## 2018-02-26 ENCOUNTER — Inpatient Hospital Stay (HOSPITAL_COMMUNITY)
Admission: EM | Admit: 2018-02-26 | Discharge: 2018-03-01 | DRG: 543 | Disposition: A | Discharge status: Home Health | Payer: Medicare HMO | Attending: Internal Medicine | Admitting: Internal Medicine

## 2018-02-26 ENCOUNTER — Other Ambulatory Visit: Payer: Self-pay | Admitting: Internal Medicine

## 2018-02-26 ENCOUNTER — Encounter (HOSPITAL_COMMUNITY): Payer: Self-pay

## 2018-02-26 ENCOUNTER — Other Ambulatory Visit: Payer: Self-pay

## 2018-02-26 DIAGNOSIS — M7989 Other specified soft tissue disorders: Secondary | ICD-10-CM | POA: Diagnosis not present

## 2018-02-26 DIAGNOSIS — F329 Major depressive disorder, single episode, unspecified: Secondary | ICD-10-CM | POA: Diagnosis present

## 2018-02-26 DIAGNOSIS — N182 Chronic kidney disease, stage 2 (mild): Secondary | ICD-10-CM | POA: Diagnosis not present

## 2018-02-26 DIAGNOSIS — K8000 Calculus of gallbladder with acute cholecystitis without obstruction: Secondary | ICD-10-CM

## 2018-02-26 DIAGNOSIS — C7951 Secondary malignant neoplasm of bone: Principal | ICD-10-CM

## 2018-02-26 DIAGNOSIS — Z8546 Personal history of malignant neoplasm of prostate: Secondary | ICD-10-CM

## 2018-02-26 DIAGNOSIS — E785 Hyperlipidemia, unspecified: Secondary | ICD-10-CM | POA: Diagnosis not present

## 2018-02-26 DIAGNOSIS — G9589 Other specified diseases of spinal cord: Secondary | ICD-10-CM | POA: Diagnosis not present

## 2018-02-26 DIAGNOSIS — R918 Other nonspecific abnormal finding of lung field: Secondary | ICD-10-CM | POA: Diagnosis present

## 2018-02-26 DIAGNOSIS — M5414 Radiculopathy, thoracic region: Secondary | ICD-10-CM | POA: Diagnosis not present

## 2018-02-26 DIAGNOSIS — G8929 Other chronic pain: Secondary | ICD-10-CM | POA: Diagnosis not present

## 2018-02-26 DIAGNOSIS — B1920 Unspecified viral hepatitis C without hepatic coma: Secondary | ICD-10-CM | POA: Diagnosis present

## 2018-02-26 DIAGNOSIS — Z79899 Other long term (current) drug therapy: Secondary | ICD-10-CM

## 2018-02-26 DIAGNOSIS — K8080 Other cholelithiasis without obstruction: Secondary | ICD-10-CM | POA: Diagnosis not present

## 2018-02-26 DIAGNOSIS — B20 Human immunodeficiency virus [HIV] disease: Secondary | ICD-10-CM | POA: Diagnosis not present

## 2018-02-26 DIAGNOSIS — T402X5S Adverse effect of other opioids, sequela: Secondary | ICD-10-CM | POA: Diagnosis not present

## 2018-02-26 DIAGNOSIS — R52 Pain, unspecified: Secondary | ICD-10-CM | POA: Diagnosis not present

## 2018-02-26 DIAGNOSIS — I129 Hypertensive chronic kidney disease with stage 1 through stage 4 chronic kidney disease, or unspecified chronic kidney disease: Secondary | ICD-10-CM | POA: Diagnosis not present

## 2018-02-26 DIAGNOSIS — E1122 Type 2 diabetes mellitus with diabetic chronic kidney disease: Secondary | ICD-10-CM | POA: Diagnosis not present

## 2018-02-26 DIAGNOSIS — M1990 Unspecified osteoarthritis, unspecified site: Secondary | ICD-10-CM

## 2018-02-26 DIAGNOSIS — R937 Abnormal findings on diagnostic imaging of other parts of musculoskeletal system: Secondary | ICD-10-CM

## 2018-02-26 DIAGNOSIS — Z833 Family history of diabetes mellitus: Secondary | ICD-10-CM

## 2018-02-26 DIAGNOSIS — W1830XA Fall on same level, unspecified, initial encounter: Secondary | ICD-10-CM | POA: Diagnosis present

## 2018-02-26 DIAGNOSIS — E1140 Type 2 diabetes mellitus with diabetic neuropathy, unspecified: Secondary | ICD-10-CM | POA: Diagnosis present

## 2018-02-26 DIAGNOSIS — I82431 Acute embolism and thrombosis of right popliteal vein: Secondary | ICD-10-CM | POA: Diagnosis not present

## 2018-02-26 DIAGNOSIS — C61 Malignant neoplasm of prostate: Secondary | ICD-10-CM | POA: Diagnosis not present

## 2018-02-26 DIAGNOSIS — Z86711 Personal history of pulmonary embolism: Secondary | ICD-10-CM

## 2018-02-26 DIAGNOSIS — E118 Type 2 diabetes mellitus with unspecified complications: Secondary | ICD-10-CM

## 2018-02-26 DIAGNOSIS — Z86718 Personal history of other venous thrombosis and embolism: Secondary | ICD-10-CM

## 2018-02-26 DIAGNOSIS — R1011 Right upper quadrant pain: Secondary | ICD-10-CM | POA: Diagnosis present

## 2018-02-26 DIAGNOSIS — K760 Fatty (change of) liver, not elsewhere classified: Secondary | ICD-10-CM | POA: Diagnosis not present

## 2018-02-26 DIAGNOSIS — R296 Repeated falls: Secondary | ICD-10-CM | POA: Diagnosis not present

## 2018-02-26 DIAGNOSIS — K8020 Calculus of gallbladder without cholecystitis without obstruction: Secondary | ICD-10-CM | POA: Diagnosis not present

## 2018-02-26 DIAGNOSIS — R1084 Generalized abdominal pain: Secondary | ICD-10-CM | POA: Diagnosis not present

## 2018-02-26 DIAGNOSIS — M899 Disorder of bone, unspecified: Secondary | ICD-10-CM | POA: Diagnosis present

## 2018-02-26 DIAGNOSIS — K5903 Drug induced constipation: Secondary | ICD-10-CM | POA: Diagnosis not present

## 2018-02-26 DIAGNOSIS — I10 Essential (primary) hypertension: Secondary | ICD-10-CM

## 2018-02-26 DIAGNOSIS — M5124 Other intervertebral disc displacement, thoracic region: Secondary | ICD-10-CM | POA: Diagnosis not present

## 2018-02-26 DIAGNOSIS — M859 Disorder of bone density and structure, unspecified: Secondary | ICD-10-CM | POA: Diagnosis not present

## 2018-02-26 DIAGNOSIS — M47816 Spondylosis without myelopathy or radiculopathy, lumbar region: Secondary | ICD-10-CM | POA: Diagnosis not present

## 2018-02-26 DIAGNOSIS — Z8249 Family history of ischemic heart disease and other diseases of the circulatory system: Secondary | ICD-10-CM

## 2018-02-26 DIAGNOSIS — R5381 Other malaise: Secondary | ICD-10-CM | POA: Diagnosis not present

## 2018-02-26 DIAGNOSIS — Z888 Allergy status to other drugs, medicaments and biological substances status: Secondary | ICD-10-CM

## 2018-02-26 DIAGNOSIS — M545 Low back pain: Secondary | ICD-10-CM | POA: Diagnosis not present

## 2018-02-26 DIAGNOSIS — F1490 Cocaine use, unspecified, uncomplicated: Secondary | ICD-10-CM | POA: Diagnosis present

## 2018-02-26 DIAGNOSIS — Z7982 Long term (current) use of aspirin: Secondary | ICD-10-CM

## 2018-02-26 DIAGNOSIS — Z8611 Personal history of tuberculosis: Secondary | ICD-10-CM

## 2018-02-26 DIAGNOSIS — J449 Chronic obstructive pulmonary disease, unspecified: Secondary | ICD-10-CM | POA: Diagnosis not present

## 2018-02-26 DIAGNOSIS — R0902 Hypoxemia: Secondary | ICD-10-CM | POA: Diagnosis not present

## 2018-02-26 DIAGNOSIS — M47814 Spondylosis without myelopathy or radiculopathy, thoracic region: Secondary | ICD-10-CM | POA: Diagnosis not present

## 2018-02-26 DIAGNOSIS — D649 Anemia, unspecified: Secondary | ICD-10-CM | POA: Diagnosis not present

## 2018-02-26 DIAGNOSIS — D631 Anemia in chronic kidney disease: Secondary | ICD-10-CM | POA: Diagnosis present

## 2018-02-26 DIAGNOSIS — T402X5A Adverse effect of other opioids, initial encounter: Secondary | ICD-10-CM | POA: Diagnosis not present

## 2018-02-26 DIAGNOSIS — E1151 Type 2 diabetes mellitus with diabetic peripheral angiopathy without gangrene: Secondary | ICD-10-CM | POA: Diagnosis present

## 2018-02-26 DIAGNOSIS — I82432 Acute embolism and thrombosis of left popliteal vein: Secondary | ICD-10-CM | POA: Diagnosis not present

## 2018-02-26 DIAGNOSIS — F17210 Nicotine dependence, cigarettes, uncomplicated: Secondary | ICD-10-CM | POA: Diagnosis present

## 2018-02-26 DIAGNOSIS — Z8673 Personal history of transient ischemic attack (TIA), and cerebral infarction without residual deficits: Secondary | ICD-10-CM

## 2018-02-26 DIAGNOSIS — M5127 Other intervertebral disc displacement, lumbosacral region: Secondary | ICD-10-CM | POA: Diagnosis not present

## 2018-02-26 DIAGNOSIS — Z7984 Long term (current) use of oral hypoglycemic drugs: Secondary | ICD-10-CM

## 2018-02-26 DIAGNOSIS — C801 Malignant (primary) neoplasm, unspecified: Secondary | ICD-10-CM | POA: Diagnosis not present

## 2018-02-26 DIAGNOSIS — Z7901 Long term (current) use of anticoagulants: Secondary | ICD-10-CM

## 2018-02-26 DIAGNOSIS — N183 Chronic kidney disease, stage 3 (moderate): Secondary | ICD-10-CM | POA: Diagnosis present

## 2018-02-26 DIAGNOSIS — M79609 Pain in unspecified limb: Secondary | ICD-10-CM | POA: Diagnosis not present

## 2018-02-26 DIAGNOSIS — E119 Type 2 diabetes mellitus without complications: Secondary | ICD-10-CM

## 2018-02-26 DIAGNOSIS — M5126 Other intervertebral disc displacement, lumbar region: Secondary | ICD-10-CM | POA: Diagnosis not present

## 2018-02-26 DIAGNOSIS — G959 Disease of spinal cord, unspecified: Secondary | ICD-10-CM | POA: Diagnosis not present

## 2018-02-26 HISTORY — DX: Malignant neoplasm of prostate: C61

## 2018-02-26 HISTORY — DX: Abnormal findings on diagnostic imaging of other parts of musculoskeletal system: R93.7

## 2018-02-26 HISTORY — DX: Right upper quadrant pain: R10.11

## 2018-02-26 LAB — CBC
HEMATOCRIT: 33 % — AB (ref 39.0–52.0)
HEMOGLOBIN: 10.3 g/dL — AB (ref 13.0–17.0)
MCH: 27 pg (ref 26.0–34.0)
MCHC: 31.2 g/dL (ref 30.0–36.0)
MCV: 86.6 fL (ref 78.0–100.0)
Platelets: 245 10*3/uL (ref 150–400)
RBC: 3.81 MIL/uL — ABNORMAL LOW (ref 4.22–5.81)
RDW: 15.8 % — AB (ref 11.5–15.5)
WBC: 9.6 10*3/uL (ref 4.0–10.5)

## 2018-02-26 LAB — COMPREHENSIVE METABOLIC PANEL
ALBUMIN: 3.1 g/dL — AB (ref 3.5–5.0)
ALK PHOS: 113 U/L (ref 38–126)
ALT: 11 U/L (ref 0–44)
ANION GAP: 9 (ref 5–15)
AST: 18 U/L (ref 15–41)
BILIRUBIN TOTAL: 0.3 mg/dL (ref 0.3–1.2)
BUN: 10 mg/dL (ref 6–20)
CALCIUM: 9 mg/dL (ref 8.9–10.3)
CO2: 19 mmol/L — AB (ref 22–32)
Chloride: 110 mmol/L (ref 98–111)
Creatinine, Ser: 1.52 mg/dL — ABNORMAL HIGH (ref 0.61–1.24)
GFR calc Af Amer: 56 mL/min — ABNORMAL LOW (ref >60)
GFR calc non Af Amer: 48 mL/min — ABNORMAL LOW (ref >60)
GLUCOSE: 125 mg/dL — AB (ref 70–99)
POTASSIUM: 4.1 mmol/L (ref 3.5–5.1)
SODIUM: 138 mmol/L (ref 135–145)
Total Protein: 7 g/dL (ref 6.5–8.1)

## 2018-02-26 LAB — GLUCOSE, CAPILLARY: Glucose-Capillary: 113 mg/dL — ABNORMAL HIGH (ref 70–99)

## 2018-02-26 LAB — URINALYSIS, ROUTINE W REFLEX MICROSCOPIC
BACTERIA UA: NONE SEEN
Bilirubin Urine: NEGATIVE
Glucose, UA: NEGATIVE mg/dL
Ketones, ur: NEGATIVE mg/dL
Leukocytes, UA: NEGATIVE
Nitrite: NEGATIVE
PH: 5 (ref 5.0–8.0)
Protein, ur: NEGATIVE mg/dL
Specific Gravity, Urine: 1.042 — ABNORMAL HIGH (ref 1.005–1.030)

## 2018-02-26 LAB — CK: Total CK: 104 U/L (ref 49–397)

## 2018-02-26 LAB — LACTATE DEHYDROGENASE: LDH: 154 U/L (ref 98–192)

## 2018-02-26 LAB — LIPASE, BLOOD: Lipase: 126 U/L — ABNORMAL HIGH (ref 11–51)

## 2018-02-26 MED ORDER — TAMSULOSIN HCL 0.4 MG PO CAPS
0.4000 mg | ORAL_CAPSULE | Freq: Every day | ORAL | Status: DC
  Administered 2018-02-26 – 2018-03-01 (×4): 0.4 mg via ORAL
  Filled 2018-02-26 (×4): qty 1

## 2018-02-26 MED ORDER — HYDROMORPHONE HCL 1 MG/ML IJ SOLN
1.0000 mg | INTRAMUSCULAR | Status: AC | PRN
  Administered 2018-02-27 (×2): 1 mg via INTRAVENOUS
  Filled 2018-02-26 (×2): qty 1

## 2018-02-26 MED ORDER — GABAPENTIN 300 MG PO CAPS
600.0000 mg | ORAL_CAPSULE | Freq: Every day | ORAL | Status: DC | PRN
  Administered 2018-02-27: 600 mg via ORAL
  Filled 2018-02-26 (×2): qty 2

## 2018-02-26 MED ORDER — ALBUTEROL SULFATE (2.5 MG/3ML) 0.083% IN NEBU
2.5000 mg | INHALATION_SOLUTION | Freq: Four times a day (QID) | RESPIRATORY_TRACT | Status: DC | PRN
Start: 2018-02-26 — End: 2018-03-01

## 2018-02-26 MED ORDER — ONDANSETRON HCL 4 MG/2ML IJ SOLN
4.0000 mg | Freq: Once | INTRAMUSCULAR | Status: AC
  Administered 2018-02-26: 4 mg via INTRAVENOUS
  Filled 2018-02-26: qty 2

## 2018-02-26 MED ORDER — DICLOFENAC SODIUM 1 % TD GEL
2.0000 g | Freq: Every day | TRANSDERMAL | Status: DC | PRN
  Filled 2018-02-26: qty 100

## 2018-02-26 MED ORDER — QUETIAPINE FUMARATE 300 MG PO TABS
300.0000 mg | ORAL_TABLET | Freq: Two times a day (BID) | ORAL | Status: DC
  Administered 2018-02-26 – 2018-03-01 (×6): 300 mg via ORAL
  Filled 2018-02-26 (×5): qty 1
  Filled 2018-02-26 (×2): qty 3
  Filled 2018-02-26: qty 1
  Filled 2018-02-26: qty 3
  Filled 2018-02-26: qty 1

## 2018-02-26 MED ORDER — UMECLIDINIUM-VILANTEROL 62.5-25 MCG/INH IN AEPB
1.0000 | INHALATION_SPRAY | Freq: Every day | RESPIRATORY_TRACT | Status: DC
  Administered 2018-02-28 – 2018-03-01 (×2): 1 via RESPIRATORY_TRACT
  Filled 2018-02-26: qty 14

## 2018-02-26 MED ORDER — VALACYCLOVIR HCL 500 MG PO TABS
500.0000 mg | ORAL_TABLET | Freq: Every day | ORAL | Status: DC
  Administered 2018-02-26 – 2018-03-01 (×4): 500 mg via ORAL
  Filled 2018-02-26 (×4): qty 1

## 2018-02-26 MED ORDER — SODIUM CHLORIDE 0.9 % IV SOLN
INTRAVENOUS | Status: AC
  Administered 2018-02-26: 23:00:00 via INTRAVENOUS

## 2018-02-26 MED ORDER — NICOTINE 7 MG/24HR TD PT24
7.0000 mg | MEDICATED_PATCH | Freq: Every day | TRANSDERMAL | Status: DC
  Administered 2018-02-26 – 2018-03-01 (×4): 7 mg via TRANSDERMAL
  Filled 2018-02-26 (×4): qty 1

## 2018-02-26 MED ORDER — ELVITEG-COBIC-EMTRICIT-TENOFAF 150-150-200-10 MG PO TABS
1.0000 | ORAL_TABLET | Freq: Every day | ORAL | Status: DC
  Administered 2018-02-26 – 2018-03-01 (×3): 1 via ORAL
  Filled 2018-02-26 (×5): qty 1

## 2018-02-26 MED ORDER — MORPHINE SULFATE (PF) 4 MG/ML IV SOLN
4.0000 mg | Freq: Once | INTRAVENOUS | Status: AC
  Administered 2018-02-26: 4 mg via INTRAVENOUS
  Filled 2018-02-26: qty 1

## 2018-02-26 MED ORDER — HYDROMORPHONE HCL 1 MG/ML IJ SOLN
1.0000 mg | Freq: Once | INTRAMUSCULAR | Status: DC
  Filled 2018-02-26: qty 1

## 2018-02-26 MED ORDER — IOHEXOL 300 MG/ML  SOLN
100.0000 mL | Freq: Once | INTRAMUSCULAR | Status: AC | PRN
  Administered 2018-02-26: 100 mL via INTRAVENOUS

## 2018-02-26 MED ORDER — BUDESONIDE 0.25 MG/2ML IN SUSP
Freq: Two times a day (BID) | RESPIRATORY_TRACT | Status: DC
  Administered 2018-02-26 – 2018-03-01 (×5): 0.25 mg via RESPIRATORY_TRACT
  Filled 2018-02-26 (×7): qty 2

## 2018-02-26 MED ORDER — HYDROMORPHONE HCL 1 MG/ML IJ SOLN
1.0000 mg | Freq: Once | INTRAMUSCULAR | Status: AC
  Administered 2018-02-26: 1 mg via INTRAVENOUS

## 2018-02-26 MED ORDER — CITALOPRAM HYDROBROMIDE 20 MG PO TABS
20.0000 mg | ORAL_TABLET | Freq: Every day | ORAL | Status: DC
  Administered 2018-02-26 – 2018-03-01 (×4): 20 mg via ORAL
  Filled 2018-02-26: qty 2
  Filled 2018-02-26: qty 1
  Filled 2018-02-26 (×3): qty 2

## 2018-02-26 NOTE — ED Notes (Signed)
Pt given some to eat a drink.

## 2018-02-26 NOTE — ED Notes (Signed)
Admitting team at bedside.

## 2018-02-26 NOTE — ED Notes (Signed)
Patient transported to Ultrasound 

## 2018-02-26 NOTE — H&P (Addendum)
Date: 02/26/2018               Patient Name:  Jacob Joseph MRN: [id number]  DOB: [birth year] Age / Sex: 62 y.o., male   PCP: Neva Seat, MD         Medical Service: Internal Medicine Teaching Service         Attending Physician: Dr. Annia Belt, MD    First Contact: Dr. Laural Golden Pager: [phone]  Second Contact: Dr. Danford Bad Pager: [phone]       After Hours (After 5p/  First Contact Pager: [phone]  weekends / holidays): Second Contact Pager: [phone]   Chief Complaint: Right side pain, back pain.  History of Present Illness: Mr. Puff is a 62 year old African-American gentleman with past past medical history significant for prostate cancer (dx 1.5 years ago), HIV, tobacco use disorder, polysubstance abuse, T2DM, hypertension, moderate COPD, pulmonary embolism, anemia, MDD, chronic low back pain, CKD II, hyperlipidemia presenting with right side/flank pain and lower back pain.  He reports that he was in his usual state of health until 1 week ago when he experienced a sudden onset for 4/10 right side/flank pain which radiated to his back.  Pain was worse with ambulation and rotation of his torso.  At onset, he had mild nausea but did not experience fever, chills, anorexia, abdominal pain or vomiting and was not associated with meals.  The pain gradually increased and was worse yesterday evening at 10/10 which prompted him to report to the emergency department.  He reports frequent falls at home and has had 2 ground-level falls in the past week.  He states that he usually loses his balance and then subsequently falls.   He has a history of prostate cancer which he states was diagnosed 1.5 years ago and follows up with Dr. Zenovia Jordan at [facility].  He has been receiving treatment for this and states of getting "a shot every 4 months."  He denies dribbling, straining, urinary frequency, urgency, weight loss or night sweats.   He has a history of chronic low back pain but  however complains of new onset  8/10 low back pain that occurred 1 month ago that worsens with cough.  He however denies lower extremity tingling, weakness or numbness.  ED course: Vitals showed BP 137/98, normal heart rate, T 99.75F, WBC of 9.6, hemoglobin of 10.3, lipase 126, urine dipstick positive for large hemoglobin, abdominal ultrasound showed gallstones with mild sonographic Murphy sign, CT abdomen pelvis showed cholelithiasis without evidence of cholecystitis, new 2.8 cm focal sclerotic lesion in the T10 vertebral body, mild fatty infiltration of liver.  He received Dilaudid 1 mg, morphine 4 mg and Zofran 4 mg.  General surgery was consulted and recommended cholecystectomy but however requested medical clearance for patient's new T10 sclerotic lesion.  Meds:  Current Meds  Medication Sig  . albuterol (PROAIR HFA) 108 (90 Base) MCG/ACT inhaler INHALE 2 PUFFS INTO THE LUNGS EVERY 6 HOURS AS NEEDED FOR WHEEZING OR SHORTNESS OF BREATH, TO NOT USE EVERY DAY, USE ONLY IF NEEDED  . albuterol (PROVENTIL) (2.5 MG/3ML) 0.083% nebulizer solution Take 3 mLs (2.5 mg total) by nebulization every 6 (six) hours as needed for wheezing or shortness of breath.  [name] Kitchen aspirin 81 MG EC tablet Take 1 tablet (81 mg total) by mouth daily.  [name] Kitchen azelastine (ASTELIN) 0.1 % nasal spray Place 1 spray into both nostrils 2 (two) times daily. Use in each nostril as directed (Patient taking differently: Place 1 spray  into both nostrils 2 (two) times daily as needed for rhinitis. )  . Budesonide 90 MCG/ACT inhaler Inhale 1-2 puffs into the lungs 2 (two) times daily.  . cholecalciferol (VITAMIN D) 1000 units tablet Take 1 tablet (1,000 Units total) by mouth daily.  . citalopram (CELEXA) 20 MG tablet Take 20 mg by mouth daily.  . diclofenac sodium (VOLTAREN) 1 % GEL APPLY 4 GRAMS EXTERNALLY TO THE AFFECTED AREA FOUR TIMES DAILY AS NEEDED FOR PAIN (Patient taking differently: Apply 2 g topically daily as needed. )  . gabapentin  (NEURONTIN) 300 MG capsule Take 2 capsules (600 mg total) by mouth at bedtime. (Patient taking differently: Take 600 mg by mouth daily as needed (for pain). )  . GENVOYA 150-150-200-10 MG TABS tablet TAKE 1 TABLET BY MOUTH DAILY WITH BREAKFAST (Patient taking differently: Take 1 tablet by mouth daily. )  . guaiFENesin-dextromethorphan (ROBITUSSIN DM) 100-10 MG/5ML syrup Take 5 mLs by mouth every 4 (four) hours as needed for cough.  . hydroxypropyl methylcellulose / hypromellose (ISOPTO TEARS / GONIOVISC) 2.5 % ophthalmic solution Place 1 drop into both eyes as needed for dry eyes.  . hydrOXYzine (VISTARIL) 50 MG capsule Take 50 mg by mouth at bedtime.   . metFORMIN (GLUCOPHAGE) 500 MG tablet TAKE 1 TABLET BY MOUTH EVERY DAY WITH BREAKFAST (Patient taking differently: Take 500 mg by mouth daily with breakfast. )  . pravastatin (PRAVACHOL) 40 MG tablet Take 1 tablet (40 mg total) by mouth daily.  [name] Kitchen PREZISTA 800 MG tablet TAKE 1 TABLET BY MOUTH EVERY DAY WITH BREAKFAST (Patient taking differently: Take 800 mg by mouth daily. )  . QUEtiapine (SEROQUEL) 300 MG tablet Take 300 mg by mouth 2 (two) times daily.   . tamsulosin (FLOMAX) 0.4 MG CAPS capsule TAKE 1 CAPSULE(0.4 MG) BY MOUTH DAILY AFTER BREAKFAST (Patient taking differently: Take 0.4 mg by mouth daily. TAKE 1 CAPSULE(0.4 MG) BY MOUTH DAILY AFTER BREAKFAST)  . Tiotropium Bromide-Olodaterol (STIOLTO RESPIMAT) 2.5-2.5 MCG/ACT AERS Inhale 2 puffs into the lungs daily.  . valACYclovir (VALTREX) 500 MG tablet TAKE 1 TABLET BY MOUTH DAILY (Patient taking differently: Take 500 mg by mouth daily. )  . XARELTO 20 MG TABS tablet TAKE 1 TABLET BY MOUTH DAILY WITH SUPPER; REPLACING SAVAYSA (Patient taking differently: Take 20 mg by mouth daily with supper. )     Allergies: Allergies as of 02/26/2018 - Review Complete 02/26/2018  Allergen Reaction Noted  . Statins Other (See Comments) 03/14/2012   Past Medical History:  Diagnosis Date  . Calcium oxalate  crystals in urine 12/23/2011   Asymptomatic, no hematuria. Advised to take plenty of water.  [name] Kitchen COPD (chronic obstructive pulmonary disease) ([place])   . Depression   . Diabetes mellitus without complication ([place]) 7782   pre diabetic  . Hepatitis C   . HIV (human immunodeficiency virus infection) ([place])   . Hypertension   . Peripheral arterial disease ([place])    ooccluded left SFA by duplex ultrasound, tibial disease left greater than right  . Stroke ([place]) 11/2011   Carotids Doppler negative. Right sided weakness resolved, initially on Plavix for 3 months and then continued with ASA.    . TB lung, latent 1988   Treated    Family History:  Positive for diabetes and hypertension Denies any family history of cancer  Social History:  -Current tobacco use, reports that he smokes half a pack of cigarettes per day for more than 30 years -Current EtOH use with last drink yesterday.  Reports  that he occasionally drinks 12 ounce beers -Current cocaine use with last use yesterday -Denies IV drug use.  Review of Systems: A complete ROS was negative except as per HPI.   Physical Exam: Blood pressure (!) 161/97, pulse 90, temperature 99.1 F (37.3 C), temperature source Oral, resp. rate 19, height 6' (1.829 m), weight 97.5 kg, SpO2 97 %.  Constitutional: In no apparent distress, appears older than stated age, appears somnolent HEENT: Atraumatic, normocephalic Eyes: Nonicteric Cardiovascular: Normal S1 and S2, no murmurs, gallops, rubs Respiratory: Coarse breath sounds Abdomen: Bowel sounds present, no Murphy sign, all 4 quadrants nontender to palpation, distended abdomen Back: Tender to palpation at the lower back at the thoracic vertebra, right flank and lower rib mildly tender to palpation Neurological: Alert and oriented, no sensory deficits at the lower extremities, 5/5 strength bilaterally lower extremity Extremity: No pitting edema   EKG: personally reviewed my interpretation is normal  sinus with no ST abnormalities  CXR: Pending  Assessment & Plan by Problem: Active Problems:   Right upper quadrant pain  Mr. Koegel is a 62 year old African-American gentleman with past past medical history significant for prostate cancer (dx 1.5 years ago), HIV, tobacco use disorder, polysubstance abuse, T2DM, hypertension, moderate COPD, pulmonary embolism, anemia, MDD, chronic low back pain, CKD II, hyperlipidemia here for evaluation of right side abdominal pain and lower back pain.  Right-sided abdominal pain: 1 week history of progressively worsening right sided abdominal/flank pain that worsens with ambulation and rotation body.  Not associated with meals, denies fevers or chills but some nausea.  Physical exam negative for Murphy sign however posterior lower rib mildly tender to palpation.  Remains afebrile, CBC negative for leukocytosis, abdominal ultrasound showed gallstones with mild sonographic Murphy sign, CT abdomen pelvis reviewed cholelithiasis without evidence of cholecystitis.  My suspicion for acute cholecystitis is low however concerned about possible bony metastasis to the rib or musculoskeletal in origin -Voltaren gel 2 g prn  Prostate cancer Acute on chronic lower back pain Diagnosed 1.5 years ago and has been receiving "a shot" every 4 months, probably Lupron.  1 month history of low back pain worsens with cough.  Denies lower extremity numbness, tingling, weakness.  Point tenderness in the lower thoracic vertebrae.  CT abdomen pelvis showed new 2.8 cm focal sclerotic lesion in the T10 vertebral body concerning for bony metastasis.  -Follow-up PSA, LDH, multiple myeloma panel, PTH -Continue Flomax 0.4 mg -Pain regimen: Dilaudid 1 mg q3 prn severe pain  History of lung nodules: Patient with heavy smoking history for greater than 30 years.  CT chest in 2017 showed Solid posterior right upper lobe pulmonary nodule measuring 2.8 mm.  -Follow-up CT chest  Diabetes mellitus  with neuropathy -Takes metformin 500 mg daily at home (Will hold for now given recent IV contrast) -Takes gabapentin 651m at home  Hypertension: Previously on lisinopril 5 mg.   CKD III: Stable. sCr of 1.52 from baseline 1.51  History of pulmonary embolism: Diagnosed on CT angio chest in 2017 with evidence of right heart strain. Takes Xarelto 20 mg at home -Hold Xarelto for now.  Moderate COPD: Continue Proventil, budesonide nebulizer, anoro ellipta  Major depressive disorder: Continue Celexa, quetiapine  HIV: Continue Genvoya, valacyclovir  Normocytic anemia: Hgb stable at 10.3  Tobacco use disorder: NicoDerm 7 mg   FEN: NS @ 763mhr, replace electrolytes as needed, n.p.o. VTE prophylaxis: SCDs CODE STATUS: Full code  Dispo: Admit patient to inpatient with expected length of stay greater than 2 midnights  Jean Rosenthal, MD 02/26/2018, 8:51 PM  Pager: [phone] IMTS PGY-1

## 2018-02-26 NOTE — ED Notes (Signed)
Patient reports right side mid flank pain X one week.

## 2018-02-26 NOTE — Progress Notes (Signed)
Patient ID: Jacob Joseph, male   DOB: October 13, 1955, 62 y.o.   MRN: [id number] He is somnolent and wont really awake for history and exam right now.  Noted [place] and ct results.  Agree with workup of sclerotic lesion. May benefit from hida scan to ensure not gb. Will follow up in am

## 2018-02-26 NOTE — ED Provider Notes (Signed)
Assumed care from Lesleigh Noe at 1600.  Jacob Joseph is a 62 year old gentleman presenting today with worsening pain in his right side for the last week.  He does occasionally have nausea and the pain is 10 out of 10 but does not seem to be exacerbated by food.  It does seem to be worse at night when he lays down and when he sits up in the morning.  CT scan was pending.  Patient was noted to have a lipase of 126 of unknown significance.  Of cheese and total bilirubin were within normal limits and hemoglobin was stable.  CT today showed a 2.8 cm focal sclerotic lesion in the T10 vertebral body which is new compared to scans in 2014.  Recommended clinical correlation to ensure this is not metastatic.  Patient also has evidence of cholelithiasis without evidence of cholecystitis on CT.  Ultrasound done to confirm no ductal stones or other acute abnormalities.  Patient given pain control.  Patient has no neurologic findings on exam and low suspicion that patient's symptoms are related to PE as he has been taking his Xarelto and has no shortness of breath or chest pain.  Heart rate is in the 80s and oxygen saturation is in the high 90s.  Patient has no known history of cancer and sees outpatient clinics here and will need close follow-up.  7:35 PM Ultrasound is equivocal as patient does have gallstones and does have a mild Murphy sign on exam.  On repeat exam patient's pain is still 8 out of 10.  He is still having right upper quadrant tenderness.  Will discuss with general surgery.  7:41 PM Spoke with Dr. Donne Hazel who will see the patient but feels he most likely needs a cholecystectomy.  Given patient's other medical issues in this new sclerotic lesion on his T10 area will admit to his medical service for surgical clearance and plan for cholecystectomy.   Blanchie Dessert, MD 02/26/18 [phone]

## 2018-02-26 NOTE — ED Notes (Signed)
ED Provider at bedside. 

## 2018-02-26 NOTE — ED Triage Notes (Signed)
Pt arrived via [facility] with c/o RUQ pain X1 week. Pt rpeorts worsening pain everyday, reports taking 2 gabapentin today for pain with no relief.

## 2018-02-27 ENCOUNTER — Inpatient Hospital Stay (HOSPITAL_COMMUNITY): Payer: Medicare HMO

## 2018-02-27 DIAGNOSIS — G9589 Other specified diseases of spinal cord: Secondary | ICD-10-CM

## 2018-02-27 DIAGNOSIS — J449 Chronic obstructive pulmonary disease, unspecified: Secondary | ICD-10-CM

## 2018-02-27 DIAGNOSIS — M899 Disorder of bone, unspecified: Secondary | ICD-10-CM

## 2018-02-27 DIAGNOSIS — R1011 Right upper quadrant pain: Secondary | ICD-10-CM

## 2018-02-27 DIAGNOSIS — Z9181 History of falling: Secondary | ICD-10-CM

## 2018-02-27 DIAGNOSIS — F17210 Nicotine dependence, cigarettes, uncomplicated: Secondary | ICD-10-CM

## 2018-02-27 DIAGNOSIS — Z833 Family history of diabetes mellitus: Secondary | ICD-10-CM

## 2018-02-27 DIAGNOSIS — Z7984 Long term (current) use of oral hypoglycemic drugs: Secondary | ICD-10-CM

## 2018-02-27 DIAGNOSIS — E1122 Type 2 diabetes mellitus with diabetic chronic kidney disease: Secondary | ICD-10-CM

## 2018-02-27 DIAGNOSIS — Z8249 Family history of ischemic heart disease and other diseases of the circulatory system: Secondary | ICD-10-CM

## 2018-02-27 DIAGNOSIS — F1490 Cocaine use, unspecified, uncomplicated: Secondary | ICD-10-CM

## 2018-02-27 DIAGNOSIS — C61 Malignant neoplasm of prostate: Secondary | ICD-10-CM

## 2018-02-27 DIAGNOSIS — G8929 Other chronic pain: Secondary | ICD-10-CM

## 2018-02-27 DIAGNOSIS — N182 Chronic kidney disease, stage 2 (mild): Secondary | ICD-10-CM

## 2018-02-27 DIAGNOSIS — F339 Major depressive disorder, recurrent, unspecified: Secondary | ICD-10-CM

## 2018-02-27 DIAGNOSIS — D649 Anemia, unspecified: Secondary | ICD-10-CM

## 2018-02-27 DIAGNOSIS — F1010 Alcohol abuse, uncomplicated: Secondary | ICD-10-CM

## 2018-02-27 DIAGNOSIS — Z79899 Other long term (current) drug therapy: Secondary | ICD-10-CM

## 2018-02-27 DIAGNOSIS — M545 Low back pain: Secondary | ICD-10-CM

## 2018-02-27 DIAGNOSIS — E785 Hyperlipidemia, unspecified: Secondary | ICD-10-CM

## 2018-02-27 DIAGNOSIS — E1140 Type 2 diabetes mellitus with diabetic neuropathy, unspecified: Secondary | ICD-10-CM

## 2018-02-27 DIAGNOSIS — B20 Human immunodeficiency virus [HIV] disease: Secondary | ICD-10-CM

## 2018-02-27 DIAGNOSIS — Z7951 Long term (current) use of inhaled steroids: Secondary | ICD-10-CM

## 2018-02-27 DIAGNOSIS — M5127 Other intervertebral disc displacement, lumbosacral region: Secondary | ICD-10-CM

## 2018-02-27 DIAGNOSIS — I129 Hypertensive chronic kidney disease with stage 1 through stage 4 chronic kidney disease, or unspecified chronic kidney disease: Secondary | ICD-10-CM

## 2018-02-27 DIAGNOSIS — K760 Fatty (change of) liver, not elsewhere classified: Secondary | ICD-10-CM

## 2018-02-27 DIAGNOSIS — Z888 Allergy status to other drugs, medicaments and biological substances status: Secondary | ICD-10-CM

## 2018-02-27 DIAGNOSIS — K8080 Other cholelithiasis without obstruction: Secondary | ICD-10-CM

## 2018-02-27 DIAGNOSIS — K8020 Calculus of gallbladder without cholecystitis without obstruction: Secondary | ICD-10-CM

## 2018-02-27 DIAGNOSIS — Z86711 Personal history of pulmonary embolism: Secondary | ICD-10-CM

## 2018-02-27 DIAGNOSIS — R918 Other nonspecific abnormal finding of lung field: Secondary | ICD-10-CM

## 2018-02-27 DIAGNOSIS — Z7901 Long term (current) use of anticoagulants: Secondary | ICD-10-CM

## 2018-02-27 LAB — IRON AND TIBC
Iron: 26 ug/dL — ABNORMAL LOW (ref 45–182)
Saturation Ratios: 9 % — ABNORMAL LOW (ref 17.9–39.5)
TIBC: 304 ug/dL (ref 250–450)
UIBC: 278 ug/dL

## 2018-02-27 LAB — RAPID URINE DRUG SCREEN, HOSP PERFORMED
AMPHETAMINES: NOT DETECTED
Barbiturates: NOT DETECTED
Benzodiazepines: NOT DETECTED
Cocaine: POSITIVE — AB
Opiates: NOT DETECTED
TETRAHYDROCANNABINOL: NOT DETECTED

## 2018-02-27 LAB — FERRITIN: Ferritin: 77 ng/mL (ref 24–336)

## 2018-02-27 MED ORDER — GADOBENATE DIMEGLUMINE 529 MG/ML IV SOLN
20.0000 mL | Freq: Once | INTRAVENOUS | Status: AC
  Administered 2018-02-27: 20 mL via INTRAVENOUS

## 2018-02-27 MED ORDER — IOHEXOL 300 MG/ML  SOLN
75.0000 mL | Freq: Once | INTRAMUSCULAR | Status: AC | PRN
  Administered 2018-02-27: 75 mL via INTRAVENOUS

## 2018-02-27 MED ORDER — TECHNETIUM TC 99M MEBROFENIN IV KIT
5.4800 | PACK | Freq: Once | INTRAVENOUS | Status: AC | PRN
  Administered 2018-02-27: 5.48 via INTRAVENOUS

## 2018-02-27 MED ORDER — OXYCODONE HCL 5 MG PO TABS
5.0000 mg | ORAL_TABLET | Freq: Four times a day (QID) | ORAL | Status: DC | PRN
  Administered 2018-02-27 – 2018-02-28 (×2): 5 mg via ORAL
  Filled 2018-02-27 (×2): qty 1

## 2018-02-27 NOTE — Progress Notes (Signed)
Medicine attending: I examined this patient today together with resident physician Dr.Areeg Rehman and I concur with her evaluation and management plan which we discussed together. Please see attending history and physical exam for complete details. 62 year old man admitted for evaluation of right flank pain.  In addition to right upper quadrant pain he had point tenderness at approximately T10.  CT scan to assess his gallbladder incidentally shows a sclerotic lesion at that level.  Considering that he had no fever, leukocytosis, and normal liver functions, and history of prostate cancer, we considered that his presenting symptoms may be related to a T10 radiculopathy from metastatic disease.  Urgent MRI shows multiple lesions in the thoracic spine at T2, 5, 9, and 10 vertebral bodies and additional dural enhancement extending from T7-T11 concerning for tumor infiltration.  Additional lesion seen in the lumbar spine at L1, 3, and 4.  Broad-based disc bulge at L5-S1 which probably accounted for his previous chronic back pain. No signs of impending spinal cord compression although he is at high risk for this. Plan: Please see plan outlined in my admission note.

## 2018-02-27 NOTE — Progress Notes (Addendum)
Subjective: Jacob Joseph reported his back pain is about the same today. He was somnolent but able to answer questions appropriately. He reported he only gets shots every few months for his prostate cancer; denied any radiation therapy. He does not know of any metastatic disease. He denied any changes in his bladder and bowel movements. He said his pain was in his right side and worse with movements.   Objective:  Vital signs in last 24 hours: Vitals:   02/26/18 1915 02/26/18 2125 02/26/18 2252 02/27/18 0535  BP: (!) 161/97 (!) 148/88  137/89  Pulse: 90 86  (!) 101  Resp:  20  20  Temp:  98.8 F (37.1 C)  98.1 F (36.7 C)  TempSrc:  Oral  Oral  SpO2: 97% 97% 97% 95%  Weight:      Height:       Physical Exam  Constitutional: He is well-developed, well-nourished, and in no distress.  Eyes: Pupils are equal, round, and reactive to light.  Cardiovascular: Normal rate, regular rhythm, normal heart sounds and intact distal pulses.  No murmur heard. Pulmonary/Chest: Effort normal and breath sounds normal. No respiratory distress.  Abdominal: Soft. Bowel sounds are normal. He exhibits no distension. There is tenderness. There is no guarding.  Neuro: sensation intact bilaterally, strength 5/5 bilaterally, patellar reflex 5/5 on right LE and 3/5 right LE, finger to nose touch intact, bicep reflex intact bilaterally  Assessment/Plan:  Active Problems:   Right upper quadrant pain   Abnormal computed tomography of thoracic spine  Jacob Joseph is a 62 year old male with a PMHx of prostate cancer (dx 1.5 years ago), HIV, tobacco use disorder, polysubstance abuse, T2DM, hypertension, COPD, pulmonary embolism, anemia, MDD, chronic low back pain, CKD II, hyperlipidemia presenting with right sided flank and abdominal pain.  Right sided flank: Patient has been complaining of right sided pain for the past week that worsens with movement. He denies any nausea, vomiting or associated pain with food.  He was tender on palpation during exam of his right flank and RUQ. He denied tenderness to his spine on exam. Remains afebrile with no leukocytosis. CT abdomen and US showed cholelithiasis without evidence of cholecystitis. CT also revealed a (new compared to 2014) 2.8 cm scelortic lesion in the T10 body. Unclear if flank pain is related to new sclerotic lesion with radiculopathy vs cholecystitis vs musculoskeletal in origin. MRI suggestive of extensive metastatic disease, with no signs of cord compression. HIDA scan recommended by surgery to help differentiate right sided pain.   MRI findings:  IMPRESSION: THORACIC SPINE  1. Abnormal bone lesions in the T2, T5, T9 and T10 vertebral bodies most concerning for metastatic disease versus multiple myeloma. Abnormal dural enhancement extending from approximately T7 through T11 most significant at T9 through T11 which may be reactive versus secondary to tumor infiltration. 2. Small Schmorl's node along the superior endplate of P38 with surrounding marrow edema without significant enhancement as can be seen with painful Schmorl's node.  LUMBAR SPINE  1. Abnormal bone lesions involving the L1, L3 and L4 vertebral bodies most concerning for metastatic disease versus multiple myeloma. 2. At L5-S1 there is a mild broad-based disc bulge. Mild bilateral facet arthropathy. Mild bilateral foraminal stenosis.  -f/u PSA, total and free testosterone -f/u nuclear medicine bone scan whole body -consider rad-onc consult  -recommend starting metastatic cancer therapy, outpatient management   -pain control: oxycodone 5 mg oral Q6H  Prostate cancer: Patient was diagnosed with prostate cancer ~1.5 years ago. Localized  vs metastatic unclear per chart review. He denied ever getting any radiation therapy and reported injections every few months for prostate cancer. [facility]. He denied any changes or trouble with bowel movements or urination.  Lower extremity sensation intact. Left patellar reflex slightly diminished. CT of abdomen/pelvis showed a new 2.8 sclerotic lesion concerning for bony metastasis.   -see MRI findings above -f/u mutiple myeloma panel and PSA  -Flomax 0.4 mg   History of lung nodules: Patient with heavy smoking history for greater than 30 years.  CT chest in 2017 showed Solid posterior right upper lobe pulmonary nodule measuring 2.8 mm.   -f/u CT chest with contrast   Diabetes mellitus with neuropathy: Last A1c 4/18 6.2 Per last [facility] visit, he is in good compliance with metformin. CBG 113 today.   -Metformin 500 mg held due to recent IV contrast, continue to hold -continue gabapentin 636m PRN  Hypertension: Previously on lisinopril 5 mg, discontinued on 12/09/17 due to lightheadedness worse upon standing from a seated position. BP 137/89.  CKD III: Stable. Cr of 1.52, near his baseline.  History of pulmonary embolism: Hospitalized 04/2016 for pulmonary embolism and had a history of  Right femoral, popliteal, posterior tibial, and peroneal vein DVT in 2014. Takes Xarelto 20 mg at home. Will continue to hold for procedural risks.  -continue to hold Xarelto   Moderate COPD: PFT's from 05/2013 show obstructive disease. He has been stable on stiolto, budesonide and albuterol.   -continue on Proventil, budesonide nebulizer, anoro ellipta  Major depressive disorder: Continue Celexa, quetiapine.  HIV: Diagnosed with HIV in 04/2006. Follows [facility] last seen in 11/2017 under very good, long term control. Takes Prezista, Genvoya, and valacyclovir.  -continue Genvoya and valacyclovir    Dispo: Anticipated discharge in approximately 2 day(s).   Arshia Rondon N, DO 02/27/2018, 9:10 AM Pager: _0 @

## 2018-02-27 NOTE — Progress Notes (Signed)
EKG findings discussed with IMS @this  time. Per MD he has reviewed EKG. No orders initiated @ this time

## 2018-02-27 NOTE — Progress Notes (Signed)
Subjective: Just got back from MRI of thoracic spine Discussed patient care and problem list with Dr. Beryle Beams  Patient is slightly somnolent but appropriate and gives good history Complains of right flank pain but has not had any gastrointestinal symptoms or food intolerance. CT and ultrasound showed gallstones but there is no evidence of inflammatory changes around the gallbladder. T10 sclerotic lesion which is new compared to 2014. History of prostate cancer.  Question whether he has bone metastasis  Objective: Vital signs in last 24 hours: Temp:  [98.1 F (36.7 C)-99.1 F (37.3 C)] 98.1 F (36.7 C) (08/11 0535) Pulse Rate:  [82-101] 101 (08/11 0535) Resp:  [19-20] 20 (08/11 0535) BP: (137-161)/(88-101) 137/89 (08/11 0535) SpO2:  [93 %-100 %] 95 % (08/11 0535) Weight:  [97.5 kg] 97.5 kg (08/10 1123)    Intake/Output from previous day: 08/10 0701 - 08/11 0700 In: 400 [I.V.:400] Out: 300 [Urine:300] Intake/Output this shift: No intake/output data recorded.  General appearance: Awake.  Oriented.  Good historian.  Somewhat lethargic. Resp: clear to auscultation bilaterally GI: Abdominal exam is mostly unremarkable.  He certainly is not tender to palpate right or left upper quadrant.  I do not feel a mass.  Variably tender to percuss right costal margin.  Do not know whether this is referred pain or could be visceral pain.  Lab Results:  Recent Labs    02/26/18 1129  WBC 9.6  HGB 10.3*  HCT 33.0*  PLT 245   BMET Recent Labs    02/26/18 1129  NA 138  K 4.1  CL 110  CO2 19*  GLUCOSE 125*  BUN 10  CREATININE 1.52*  CALCIUM 9.0   PT/INR No results for input(s): LABPROT, INR in the last 72 hours. ABG No results for input(s): PHART, HCO3 in the last 72 hours.  Invalid input(s): PCO2, PO2  Studies/Results: Ct Abdomen Pelvis W Contrast  Result Date: 02/26/2018 CLINICAL DATA:  Right upper quadrant abdomen pain for 1 week. EXAM: CT ABDOMEN AND PELVIS WITH  CONTRAST TECHNIQUE: Multidetector CT imaging of the abdomen and pelvis was performed using the standard protocol following bolus administration of intravenous contrast. CONTRAST:  168mL OMNIPAQUE IOHEXOL 300 MG/ML  SOLN COMPARISON:  March 17, 2013 FINDINGS: Lower chest: Mild dependent atelectasis of posterior lung bases are noted. The heart size is normal. Hepatobiliary: There is diffuse low density of the liver. No focal liver lesions identified. Gallstones identified in the gallbladder. There is no inflammatory change around gallbladder. The biliary tree is normal. Pancreas: Unremarkable. No pancreatic ductal dilatation or surrounding inflammatory changes. Spleen: Normal in size without focal abnormality. Adrenals/Urinary Tract: The adrenal glands are normal. There are multiple cysts in bilateral kidneys. No hydronephrosis is identified bilaterally. The bladder is normal. Stomach/Bowel: Stomach is within normal limits. Appendix appears normal. No evidence of bowel wall thickening, distention, or inflammatory changes. Vascular/Lymphatic: Aortic atherosclerosis. No enlarged abdominal or pelvic lymph nodes. Reproductive: Prostate is unremarkable. Other: No abdominal wall hernia or abnormality. No abdominopelvic ascites. Musculoskeletal: There is 2.7 x 2.8 cm sclerotic lesion in the T10 vertebral body, new since the prior exam. Degenerative joint changes of the spine are noted. IMPRESSION: No acute abnormality identified in the abdomen and pelvis. 2.8 cm focal sclerotic lesion in the T10 vertebral body, new compared to prior CT of March 17, 2013. Clinical correlation regarding history of primary neoplasm is recommended as metastatic lesion is a differential possibility. Mild fatty infiltration of liver. Cholelithiasis without evidence of cholecystitis on CT. Cyst in both kidneys.  Electronically Signed   By: Abelardo Diesel M.D.   On: 02/26/2018 16:53   US Abdomen Limited Ruq  Result Date: 02/26/2018 CLINICAL DATA:   Right upper quadrant abdomen pain for 1 week. EXAM: ULTRASOUND ABDOMEN LIMITED RIGHT UPPER QUADRANT COMPARISON:  None. FINDINGS: Gallbladder: No wall thickening visualized. Gallstone is noted. The ultrasonographer reports mild sonographic Murphy sign. Common bile duct: Diameter: 4.7 mm Liver: No focal lesion identified. Within normal limits in parenchymal echogenicity. Portal vein is patent on color Doppler imaging with normal direction of blood flow towards the liver. IMPRESSION: Gallstone identified in the gallbladder. The ultrasonographer reports mild sonographic Murphy sign. The findings are equivocal for acute cholecystitis. Clinical correlation is recommended. Electronically Signed   By: Abelardo Diesel M.D.   On: 02/26/2018 19:09    Anti-infectives: Anti-infectives (From admission, onward)   Start     Dose/Rate Route Frequency Ordered Stop   02/26/18 2200  valACYclovir (VALTREX) tablet 500 mg     500 mg Oral Daily 02/26/18 2026     02/26/18 2145  elvitegravir-cobicistat-emtricitabine-tenofovir (GENVOYA) 150-150-200-10 MG tablet 1 tablet     1 tablet Oral Daily with breakfast 02/26/18 2026        Assessment/Plan:  Gallstones.  These may be asymptomatic.  Physical exam not impressive.  Imaging studies do not reveal inflammatory changes. -Pain may be musculoskeletal in origin -We will proceed with hepatobiliary nuclear medicine scan to help differentiate right-sided pain.  Prostate cancer with possible bone metastasis History of lung nodules History of heavy smoking Diabetes mellitus with neuropathy Hypertension Moderate COPD History of pulmonary embolism takes Xarelto at home but Xarelto on hold for now Major depressive disorder on Celexa and quetiapine HIV positive Normocytic anemia, chronic     LOS: 1 day    Adin Hector 02/27/2018

## 2018-02-28 ENCOUNTER — Encounter (HOSPITAL_COMMUNITY): Payer: Self-pay | Admitting: Internal Medicine

## 2018-02-28 ENCOUNTER — Encounter: Payer: Self-pay | Admitting: Oncology

## 2018-02-28 ENCOUNTER — Telehealth: Payer: Self-pay | Admitting: Pharmacist

## 2018-02-28 ENCOUNTER — Telehealth: Payer: Self-pay | Admitting: Oncology

## 2018-02-28 ENCOUNTER — Other Ambulatory Visit: Payer: Self-pay | Admitting: Oncology

## 2018-02-28 DIAGNOSIS — Z720 Tobacco use: Secondary | ICD-10-CM

## 2018-02-28 DIAGNOSIS — M5414 Radiculopathy, thoracic region: Secondary | ICD-10-CM

## 2018-02-28 DIAGNOSIS — C7951 Secondary malignant neoplasm of bone: Secondary | ICD-10-CM

## 2018-02-28 DIAGNOSIS — F1910 Other psychoactive substance abuse, uncomplicated: Secondary | ICD-10-CM

## 2018-02-28 DIAGNOSIS — C61 Malignant neoplasm of prostate: Secondary | ICD-10-CM

## 2018-02-28 DIAGNOSIS — T402X5S Adverse effect of other opioids, sequela: Secondary | ICD-10-CM

## 2018-02-28 DIAGNOSIS — G959 Disease of spinal cord, unspecified: Secondary | ICD-10-CM

## 2018-02-28 DIAGNOSIS — K5903 Drug induced constipation: Secondary | ICD-10-CM

## 2018-02-28 LAB — COMPREHENSIVE METABOLIC PANEL
ALT: 10 U/L (ref 0–44)
AST: 13 U/L — ABNORMAL LOW (ref 15–41)
Albumin: 2.7 g/dL — ABNORMAL LOW (ref 3.5–5.0)
Alkaline Phosphatase: 92 U/L (ref 38–126)
Anion gap: 11 (ref 5–15)
BUN: 10 mg/dL (ref 6–20)
CO2: 22 mmol/L (ref 22–32)
Calcium: 8.8 mg/dL — ABNORMAL LOW (ref 8.9–10.3)
Chloride: 105 mmol/L (ref 98–111)
Creatinine, Ser: 1.35 mg/dL — ABNORMAL HIGH (ref 0.61–1.24)
GFR calc non Af Amer: 56 mL/min — ABNORMAL LOW (ref >60)
Glucose, Bld: 112 mg/dL — ABNORMAL HIGH (ref 70–99)
POTASSIUM: 3.7 mmol/L (ref 3.5–5.1)
Sodium: 138 mmol/L (ref 135–145)
TOTAL PROTEIN: 6.8 g/dL (ref 6.5–8.1)
Total Bilirubin: 0.4 mg/dL (ref 0.3–1.2)

## 2018-02-28 LAB — PSA, SERUM (SERIAL MONITOR): Prostate Specific Ag, Serum: 7 ng/mL — ABNORMAL HIGH (ref 0.0–4.0)

## 2018-02-28 LAB — PARATHYROID HORMONE, INTACT (NO CA): PTH: 16 pg/mL (ref 15–65)

## 2018-02-28 MED ORDER — POLYETHYLENE GLYCOL 3350 17 G PO PACK
17.0000 g | PACK | Freq: Every day | ORAL | Status: DC
  Administered 2018-02-28 – 2018-03-01 (×2): 17 g via ORAL
  Filled 2018-02-28 (×2): qty 1

## 2018-02-28 MED ORDER — GABAPENTIN 300 MG PO CAPS
600.0000 mg | ORAL_CAPSULE | Freq: Two times a day (BID) | ORAL | Status: DC
Start: 2018-02-28 — End: 2018-03-01
  Administered 2018-02-28 – 2018-03-01 (×3): 600 mg via ORAL
  Filled 2018-02-28 (×3): qty 2

## 2018-02-28 MED ORDER — RIVAROXABAN 20 MG PO TABS: 20.0000 mg | ORAL_TABLET | Freq: Every day | ORAL | Status: DC

## 2018-02-28 MED ORDER — APIXABAN 2.5 MG PO TABS
2.5000 mg | ORAL_TABLET | Freq: Two times a day (BID) | ORAL | Status: DC
  Administered 2018-02-28 – 2018-03-01 (×2): 2.5 mg via ORAL
  Filled 2018-02-28 (×2): qty 1

## 2018-02-28 MED ORDER — OXYCODONE HCL 10 MG PO TABS: 10.0000 mg | ORAL_TABLET | Freq: Four times a day (QID) | ORAL | 0 refills | Status: DC

## 2018-02-28 MED ORDER — DARUNAVIR ETHANOLATE 800 MG PO TABS
800.0000 mg | ORAL_TABLET | Freq: Every day | ORAL | Status: DC
  Administered 2018-02-28 – 2018-03-01 (×2): 800 mg via ORAL
  Filled 2018-02-28 (×2): qty 1

## 2018-02-28 MED ORDER — OXYCODONE HCL 5 MG PO TABS
10.0000 mg | ORAL_TABLET | Freq: Four times a day (QID) | ORAL | Status: DC | PRN
  Administered 2018-02-28 – 2018-03-01 (×5): 10 mg via ORAL
  Filled 2018-02-28 (×6): qty 2

## 2018-02-28 NOTE — Progress Notes (Signed)
Subjective: Mr. Grace Bushy reported that his back pain is the same as yesterday. It is still radiating to his RUQ. He said the pain is ongoing with and without movement. He said he is still able to urinate and have bowel movements normally. He denied any nausea, vomiting, SOB, chest pain, numbness or tingling in his extremities. He was made aware that we are concerned of metastatic disease and understood the need for a close oncology follow up.    Objective:  Vital signs in last 24 hours: Vitals:   02/28/18 0508 02/28/18 0725 02/28/18 0918 02/28/18 1348  BP: (!) 131/95   (!) 143/83  Pulse: 91   (!) 108  Resp: 20   18  Temp: 99.1 F (37.3 C)   97.7 F (36.5 C)  TempSrc: Oral   Axillary  SpO2: 96% 95% 96% 97%  Weight:      Height:       Physical Exam  Constitutional: He is well-developed, well-nourished, and in no distress.  Cardiovascular: Normal rate, regular rhythm, normal heart sounds and intact distal pulses.  No murmur heard. Pulmonary/Chest: Effort normal and breath sounds normal. No respiratory distress. He has no wheezes.  Abdominal: Soft. Bowel sounds are normal. He exhibits distension. There is tenderness (RUQ tenderness). There is no rebound and no guarding.  Musculoskeletal: He exhibits no edema.  Neurological: sensation intact, lower extremity strength 5/5, alert and oriented x3, very tender to light palpation of right flank  Assessment/Plan:  Active Problems:   HIV disease (HCC)   Benign essential HTN   Diabetes mellitus treated with oral medication (HCC)   Hx of pulmonary embolus   Right upper quadrant pain   Abnormal computed tomography of thoracic spine   Bone lesion   Calculus of gallbladder without cholecystitis without obstruction   Chronic anticoagulation  Mr. Quast is a 62 year old male with a PMHx of prostate cancer(dx 1.5 years ago),HIV, tobacco use disorder, polysubstance abuse,T2DM, hypertension, COPD, pulmonary embolism, anemia,MDD,chronic  low back pain,CKD II,hyperlipidemiapresenting with right sided flank and abdominal pain. CT and MRI imaging is concerning for metastatic disease.   Right sided flank pain: HIDA scan normal and his symptoms do not seem consistent with biliary disease. Seen by general surgery who agree. Patient continues to experience significant right-sided flank pain. No worse with movement but had exceptional pain with light touch, concerning for neuropathic pain. Suspect pain related to numerous bony lesions of the spine which could be causing nerve impingement. Have asked PT/OT to evaluate and help determine his safety at home who recommend [facility] PT/OT which will be arranged for [name].  -Increase Gabapentin to 600mg  BID; room to escalate if helpful but not optimal -Increase Oxycodone to 10mg  -Will likely dc home tomorrow if pain is adequately controlled -Added Miralax to help with constipation related to opioids -HFU apt already scheduled with PCP 8/15 at 1:45 pm -PSA, testosterone still pending  Prostate Cancer, Concern for Bony Metastasis  Patients pain may be related to the numerous bony lesions likely representing mets from known Prostate Cancer. Expressed our concern for metastasis and need for close follow-up with patient. Scheduled an appointment with Dr. Alen Blew at the [facility] 8/23 at 11am. Has NM bone scan scheduled for tomorrow, will likely be dc home after this with close follow-up.  -Bone scan tomorrow  HIV History of Unprovoked PE Notified by pharmacy that Xarelto is contraindicated with Genvoya/Prezista due to risk of increased Xarelto concentration and subsequent risk of bleeding/brusing. Pharmacy discussed with ID who  both agree that transition to a different anticoagulant and recommend Eliquis at 50% reduced dose (2.5mg  PO BID) which has been ordered.   Dispo: Anticipated discharge tomorrow pending adequate pain control.  Mirakle Tomlin, DO 02/28/2018, 3:37 PM Pager: [phone]

## 2018-02-28 NOTE — Care Management Note (Addendum)
Case Management Note  Patient Details  Name: Jacob Joseph MRN: [id number] Date of Birth: 1956-06-27  Subjective/Objective:                    Action/Plan:  PT recommending HHPT. Discussed discharge planning with patient at bedside. He is from home with wife who can provide assistance at home. Patient already has walker and a cane at home. Patient requesting transportation home. Confirmed face sheet information with patient. Will consult social work. Paged Dr Laural Golden [phone] requested home health orders. Patient is possible discharge today. Dr Laural Golden will call CM back with decision.   Patient aware discharge may be this evening or tomorrow am. Liana Gerold PTAR transportation home patient declined. Per PT notes patient needed assistance transferring. Patient stated he can take the bus home. Again offered PTAR. Patient declined . Patient agreeable to cab. Social Control and instrumentation engineer. Patient states he is aware cab driver will not help him in/out of cab. His wife is at home and can assist him . PTAR paperwork placed in shadow chart in case patient changes his mind. Eliquis 30 day free card given. Patient has Medicaid $3 co pay.   Patient face sheet information confirmed. Patient states his apartment is first story. Expected Discharge Date:                  Expected Discharge Plan:  [place]  In-House Referral:     Discharge planning Services  CM Consult  Post Acute Care Choice:  Home Health Choice offered to:  Patient  DME Arranged:  N/A DME Agency:  NA  HH Arranged:  PT [place] Agency:  [place]  Status of Service:  In process, will continue to follow  If discussed at Long Length of Stay Meetings, dates discussed:    Additional Comments:  Marilu Favre, RN 02/28/2018, 3:55 PM

## 2018-02-28 NOTE — Progress Notes (Deleted)
02/28/18 1318  PT Visit Information  Last PT Received On 02/28/18  Assistance Needed +1  History of Present Illness Jacob Joseph is a 62 y.o. M who presented to the ED with R UQ pain. PMH includes prostate Cx, HIV, tobacco use, polysubstance abuse, T2DM, HTN, COPD, PE, Anemia, LBP, CKD, HLD. Per notes, CT scan and Ultrasound significant for gallstones. MRI revealed bone lesions on vertebral bodies of T2, T9, and T10. Per notes, physicians think lesions may be metastisized from prostate Cx.    Precautions  Precautions Fall  Restrictions  Weight Bearing Restrictions No  Home Living  Family/patient expects to be discharged to: Private residence  Living Arrangements Spouse/significant other  Available Help at Discharge Family;Available 24 hours/day  Type of Home Apartment  Home Access Stairs to enter  Entrance Stairs-Number of Steps 3  Entrance Stairs-Rails Can reach both;Right;Left  Home Layout One level  Bathroom Higher education careers adviser - 2 wheels;Walker - 4 wheels;Cane - single point  Prior Function  Level of Independence Needs assistance  Gait / Transfers Assistance Needed Pt uses cane at home and when walking longer distances  ADL's / [place] Pt reports wife can help him get dressed   Communication  Communication No difficulties  Pain Assessment  Pain Assessment Faces  Faces Pain Scale 6  Pain Location R ribs  Pain Descriptors / Indicators Grimacing;Guarding;Sore;Sharp  Pain Intervention(s) Limited activity within patient's tolerance;Monitored during session;Repositioned;Premedicated before session  Cognition  Arousal/Alertness Awake/alert  Behavior During Therapy WFL for tasks assessed/performed  Overall Cognitive Status Within Functional Limits for tasks assessed  Upper Extremity Assessment  Upper Extremity Assessment Defer to OT evaluation  RUE Deficits / Details Pt noted increase pain in R  ribs/side with abducting R UE  Lower Extremity Assessment  Lower Extremity Assessment Generalized weakness  Cervical / Trunk Assessment  Cervical / Trunk Assessment Normal  Bed Mobility  Overal bed mobility Needs Assistance  Bed Mobility Supine to Sit  Supine to sit Supervision  General bed mobility comments Pt was able to scoot to EOB without physical assist, but reported some difficulty  Transfers  Overall transfer level Needs assistance  Equipment used Rolling walker (2 wheeled);None  Transfers Sit to/from Stand  Sit to [employer] assist  General transfer comment Pt had difficulty performing sit<>stand transfers without AD, requiring MinA from PT for initial power up. Pt had unsteadiness upon standing, so PT returned pt to sitting. Pt performed sit<>stand transfers with RW. Pt had increased steadiness with RW and decreased difficulty during transfers with RW, but still had difficulty during initial power up, and relied on flexed trunk and MinA from PT to stand. Pt required cues for safe hand placement.  Ambulation/Gait  Ambulation/Gait assistance Min guard  Gait Distance (Feet) 125 Feet  Assistive device Rolling walker (2 wheeled)  Gait Pattern/deviations Step-through pattern;Decreased stride length  General Gait Details Pt ambulated with MinG and RW. Pt moved slowly and guarded R side during ambulation; pt reported this was his normal walking speed. Pt responded to cues to stand up tall. Pt grew more fatigued as ambulation progressed, with more labored breathing and more flexed posture using RW. Pt was also more reliant on RW towards end of ambulation.   Gait velocity Decreased  Balance  Overall balance assessment Needs assistance  Sitting-balance support No upper extremity supported;Feet supported  Sitting balance-Leahy Scale Good  Sitting balance - Comments Pt was able to scoot hips to EOB  Standing balance support No upper extremity supported  Standing balance-Leahy Scale Poor   Standing balance comment Pt reliant on RW for balance. Pt had difficulty with static standing without BUE support.   PT - End of Session  Equipment Utilized During Treatment Gait belt  Activity Tolerance Patient limited by fatigue  Patient left in chair;with call bell/phone within reach  Nurse Communication Mobility status  PT Assessment  PT Recommendation/Assessment Patient needs continued PT services  PT Visit Diagnosis Unsteadiness on feet (R26.81);Other abnormalities of gait and mobility (R26.89);Muscle weakness (generalized) (M62.81);Difficulty in walking, not elsewhere classified (R26.2);Pain  Pain - Right/Left Right  Pain - part of body  (Abdomen)  PT Problem List Decreased strength;Decreased range of motion;Decreased activity tolerance;Decreased balance;Decreased mobility;Decreased knowledge of use of DME;Pain  Barriers to Discharge Other (comment)  Barriers to Discharge Comments Pt reliant on bus system to get home, and does not have any of his assistive devices at the hospital. Pt may need help coordinating a safe means to get home with d/c that decreases his risk for falls.   PT Plan  PT Frequency (ACUTE ONLY) Min 3X/week  PT Treatment/Interventions (ACUTE ONLY) DME instruction;Gait training;Stair training;Functional mobility training;Therapeutic exercise;Therapeutic activities;Balance training;Patient/family education  AM-PAC PT "6 Clicks" Daily Activity Outcome Measure  Difficulty turning over in bed (including adjusting bedclothes, sheets and blankets)? 3  Difficulty moving from lying on back to sitting on the side of the bed?  3  Difficulty sitting down on and standing up from a chair with arms (e.g., wheelchair, bedside commode, etc,.)? 1  Help needed moving to and from a bed to chair (including a wheelchair)? 3  Help needed walking in hospital room? 3  Help needed climbing 3-5 steps with a railing?  2  6 Click Score 15  Mobility G Code  CK  PT Recommendation  Follow Up  Recommendations Home health PT;Supervision for mobility/OOB  PT equipment None recommended by PT  Individuals Consulted  Consulted and Agree with Results and Recommendations Patient  Acute Rehab PT Goals  Patient Stated Goal To go home  PT Time Calculation  PT Start Time (ACUTE ONLY) 1225  PT Stop Time (ACUTE ONLY) 1245  PT Time Calculation (min) (ACUTE ONLY) 20 min  Written Expression  Dominant Hand Right    Pt presents with problems and deficits above. Pt performed bed mobility with Supervision. Pt performed sit<>stand transfers with MinA, and required RW due to unsteadiness. Pt ambulated with MinG and RW, and fatigued during session. Educated patient on safe technique climbing stairs with rails upon d/c. Pt and wife are reliant on the bus system for transportation, and may benefit from [place] or having a taxi called upon d/c to increase safety. Will continue to follow acutely to support independence, mobility, and safety.   Elwin Mocha, S-DPT [place] [employer] [phone]

## 2018-02-28 NOTE — Telephone Encounter (Signed)
Lavella Lemons, one of the PGY1 pharmacy residents, called me this afternoon asking for advice about patient's current HIV regimen and his medication for his history of a PE. Patient used to be on Edoxaban 60 mg daily which was fine with his ART - Genvoya/Prezista.  It seems like at some point in April/May of this year, the patient was switched to Xarelto.   Xarelto is contraindicated with Genvoya/Prezista as it causes massive increases in Xarelto concentration and an increased risk of bleeding/bruising. Spoke with Dr. Megan Salon and he would prefer not to change patient's ART as he has a history of noncompliance and probable resistance.  Lavella Lemons will take to the internal medicine team about switching patient to something else - either back to Edoxaban 60 mg daily or Eliquis at a 50% reduced dose of 2.5 mg PO BID.

## 2018-02-28 NOTE — Progress Notes (Signed)
02/28/18 1318  PT Visit Information  Last PT Received On 02/28/18  Assistance Needed +1  History of Present Illness Jacob Joseph is a 62 y.o. M who presented to the ED with R UQ pain. PMH includes prostate Cx, HIV, tobacco use, polysubstance abuse, T2DM, HTN, COPD, PE, Anemia, LBP, CKD, HLD. Per notes, CT scan and Ultrasound significant for gallstones. MRI revealed bone lesions on vertebral bodies of T2, T9, and T10. Per notes, physicians think lesions may be metastisized from prostate Cx. Broad-based disc bulge at L5-S1 which probably accounted for his previous chronic back pain.   Precautions  Precautions Fall  Restrictions  Weight Bearing Restrictions No  Home Living  Family/patient expects to be discharged to: Private residence  Living Arrangements Spouse/significant other  Available Help at Discharge Family;Available 24 hours/day  Type of Home Apartment  Home Access Stairs to enter  Entrance Stairs-Number of Steps 3  Entrance Stairs-Rails Can reach both;Right;Left  Home Layout One level  Bathroom Higher education careers adviser - 2 wheels;Walker - 4 wheels;Cane - single point  Prior Function  Level of Independence Needs assistance  Gait / Transfers Assistance Needed Pt uses cane at home and when walking longer distances  ADL's / [place] Pt reports wife can help him get dressed   Communication  Communication No difficulties  Pain Assessment  Pain Assessment Faces  Faces Pain Scale 6  Pain Location R side of trunk  Pain Descriptors / Indicators Grimacing;Guarding;Sore;Sharp  Pain Intervention(s) Limited activity within patient's tolerance;Monitored during session;Repositioned;Premedicated before session  Cognition  Arousal/Alertness Awake/alert  Behavior During Therapy WFL for tasks assessed/performed  Overall Cognitive Status Within Functional Limits for tasks assessed  Upper Extremity Assessment  Upper  Extremity Assessment Defer to OT evaluation  RUE Deficits / Details Pt noted increase pain in R ribs/side with abducting R UE  Lower Extremity Assessment  Lower Extremity Assessment Generalized weakness  Cervical / Trunk Assessment  Cervical / Trunk Assessment Normal  Bed Mobility  Overal bed mobility Needs Assistance  Bed Mobility Supine to Sit  Supine to sit Supervision  General bed mobility comments Pt was able to scoot to EOB without physical assist, but reported some difficulty  Transfers  Overall transfer level Needs assistance  Equipment used Rolling walker (2 wheeled);None  Transfers Sit to/from Stand  Sit to [employer] assist  General transfer comment Pt had difficulty performing sit<>stand transfers without AD, requiring MinA from PT for initial power up. Pt had unsteadiness upon standing, so PT returned pt to sitting. Pt performed sit<>stand transfers with RW. Pt had increased steadiness with RW and decreased difficulty during transfers with RW, but still had difficulty during initial power up, and relied on flexed trunk and MinA from PT to stand. Pt required cues for safe hand placement.  Ambulation/Gait  Ambulation/Gait assistance Min guard  Gait Distance (Feet) 125 Feet  Assistive device Rolling walker (2 wheeled)  Gait Pattern/deviations Step-through pattern;Decreased stride length  General Gait Details Pt ambulated with MinG and RW. Pt moved slowly and guarded R side during ambulation; pt reported this was his normal walking speed. Pt responded to cues to stand up tall. Pt grew more fatigued as ambulation progressed, with more labored breathing and more flexed posture using RW. Breathing improved with seated rest. Pt was also more reliant on RW towards end of ambulation.   Gait velocity Decreased  Balance  Overall balance assessment Needs assistance  Sitting-balance support No upper extremity  supported;Feet supported  Sitting balance-Leahy Scale Good  Sitting balance -  Comments Pt was able to scoot hips to EOB  Standing balance support No upper extremity supported  Standing balance-Leahy Scale Poor  Standing balance comment Pt reliant on RW for balance. Pt had difficulty with static standing without BUE support.   PT - End of Session  Equipment Utilized During Treatment Gait belt  Activity Tolerance Patient limited by fatigue  Patient left in chair;with call bell/phone within reach  Nurse Communication Mobility status  PT Assessment  PT Recommendation/Assessment Patient needs continued PT services  PT Visit Diagnosis Unsteadiness on feet (R26.81);Other abnormalities of gait and mobility (R26.89);Muscle weakness (generalized) (M62.81);Difficulty in walking, not elsewhere classified (R26.2);Pain  Pain - Right/Left Right  Pain - part of body  (Abdomen)  PT Problem List Decreased strength;Decreased range of motion;Decreased activity tolerance;Decreased balance;Decreased mobility;Decreased knowledge of use of DME;Pain  Barriers to Discharge Other (comment)  Barriers to Discharge Comments Pt reliant on bus system to get home, and does not have any of his assistive devices at the hospital. Pt may need help coordinating a safe means to get home with d/c that decreases his risk for falls.   PT Plan  PT Frequency (ACUTE ONLY) Min 3X/week  PT Treatment/Interventions (ACUTE ONLY) DME instruction;Gait training;Stair training;Functional mobility training;Therapeutic exercise;Therapeutic activities;Balance training;Patient/family education  AM-PAC PT "6 Clicks" Daily Activity Outcome Measure  Difficulty turning over in bed (including adjusting bedclothes, sheets and blankets)? 3  Difficulty moving from lying on back to sitting on the side of the bed?  3  Difficulty sitting down on and standing up from a chair with arms (e.g., wheelchair, bedside commode, etc,.)? 1  Help needed moving to and from a bed to chair (including a wheelchair)? 3  Help needed walking in hospital  room? 3  Help needed climbing 3-5 steps with a railing?  2  6 Click Score 15  Mobility G Code  CK  PT Recommendation  Follow Up Recommendations Home health PT;Supervision for mobility/OOB  PT equipment None recommended by PT  Individuals Consulted  Consulted and Agree with Results and Recommendations Patient  Acute Rehab PT Goals  Patient Stated Goal To go home  PT Goal Formulation With patient  Time For Goal Achievement 03/14/18  Potential to Achieve Goals Good  PT Time Calculation  PT Start Time (ACUTE ONLY) 1225  PT Stop Time (ACUTE ONLY) 1245  PT Time Calculation (min) (ACUTE ONLY) 20 min  PT General Charges  $$ ACUTE PT VISIT 1 Visit  PT Evaluation  $PT Eval Low Complexity 1 Low  Written Expression  Dominant Hand Right     Pt presents with problems and deficits above. Pt performed bed mobility with Supervision. Pt performed sit<>stand transfers with MinA, and required RW due to unsteadiness. Pt ambulated with MinG and RW, and fatigued during session. Educated patient on safe technique climbing stairs with rails upon d/c. Pt and wife are reliant on the bus system for transportation, and may benefit from [place] or having a taxi transport home upon d/c to increase safety. Will continue to follow acutely to support independence, mobility, and safety.   Elwin Mocha, S-DPT [facility] [phone]

## 2018-02-28 NOTE — Progress Notes (Signed)
Internal Medicine Attending  Date: 02/28/2018  Patient name: Leny Morozov Medical record number: [id number] Date of birth: Apr 09, 1956 Age: 62 y.o. Gender: male  I saw and evaluated the patient. I reviewed the resident's note by Dr. Danford Bad and I agree with the resident's findings and plans as documented in her progress note.  When seen on rounds this morning Mr. Venuto was still in considerable pain. Specifically, he had right-sided flank pain that radiated in a radicular fashion. This most likely is related to his thoracic spine lesions with lateral nerve impingement. We have therefore decided to schedule his gabapentin and increase the dose. If tolerated, but yet suboptimal, this dose could be escalated as an outpatient. We have also increased the dose of the narcotic pain medication in hopes of providing additional pain relief. He will undergo a bone scan tomorrow, but I am concerned the recent HIDA scan may adversely impact its interpretation. That said, I anticipate he'll be discharged home after the bone scan if we have achieved some improvement in his radicular pain. Follow-up will be in the cancer center as scheduled in 11 days as well as with his primary care provider in 3 days.

## 2018-02-28 NOTE — Telephone Encounter (Signed)
I received a call from Dr. Laural Golden to schedule an oncology appt for the pt who is currently admitted in the hospital. Per Dr. Laural Golden. The pt has a history of prostate cancer, concerning for metastatic disease. Pt has been scheduled to see Dr. Alen Blew on 8/23 at 11am. Dr. Laural Golden will provide the appt information to the pt. Letter mailed.

## 2018-02-28 NOTE — Progress Notes (Signed)
Patient ID: Jacob Joseph, male   DOB: Nov 04, 1955, 62 y.o.   MRN: [id number]       Subjective: Patient up in a chair this am.  Still having some pain on the right side of his abdomen around the flank and in the lower portion as well.  This is not effected by eating.    Objective: Vital signs in last 24 hours: Temp:  [98.5 F (36.9 C)-99.1 F (37.3 C)] 99.1 F (37.3 C) (08/12 0508) Pulse Rate:  [82-102] 91 (08/12 0508) Resp:  [20-22] 20 (08/12 0508) BP: (119-164)/(68-95) 131/95 (08/12 0508) SpO2:  [95 %-98 %] 96 % (08/12 0918) Last BM Date: 02/25/18  Intake/Output from previous day: 08/11 0701 - 08/12 0700 In: 120 [P.O.:120] Out: 650 [Urine:650] Intake/Output this shift: No intake/output data recorded.  PE: Abd: soft, mildly tender along the right side of his abdomen. +BS, ND, obese, +BS  Lab Results:  Recent Labs    02/26/18 1129  WBC 9.6  HGB 10.3*  HCT 33.0*  PLT 245   BMET Recent Labs    02/26/18 1129 02/28/18 0539  NA 138 138  K 4.1 3.7  CL 110 105  CO2 19* 22  GLUCOSE 125* 112*  BUN 10 10  CREATININE 1.52* 1.35*  CALCIUM 9.0 8.8*   PT/INR No results for input(s): LABPROT, INR in the last 72 hours. CMP     Component Value Date/Time   NA 138 02/28/2018 0539   NA 140 01/27/2016 1527   K 3.7 02/28/2018 0539   CL 105 02/28/2018 0539   CO2 22 02/28/2018 0539   GLUCOSE 112 (H) 02/28/2018 0539   BUN 10 02/28/2018 0539   BUN 14 01/27/2016 1527   CREATININE 1.35 (H) 02/28/2018 0539   CREATININE 1.51 (H) 11/04/2017 1129   CALCIUM 8.8 (L) 02/28/2018 0539   PROT 6.8 02/28/2018 0539   ALBUMIN 2.7 (L) 02/28/2018 0539   AST 13 (L) 02/28/2018 0539   ALT 10 02/28/2018 0539   ALKPHOS 92 02/28/2018 0539   BILITOT 0.4 02/28/2018 0539   GFRNONAA 56 (L) 02/28/2018 0539   GFRNONAA 84 01/28/2015 1555   GFRAA >60 02/28/2018 0539   GFRAA >89 01/28/2015 1555   Lipase     Component Value Date/Time   LIPASE 126 (H) 02/26/2018 1129        Studies/Results: Ct Chest W Contrast  Result Date: 02/27/2018 CLINICAL DATA:  History of sarcoma. Evaluate for metastatic disease. EXAM: CT CHEST WITH CONTRAST TECHNIQUE: Multidetector CT imaging of the chest was performed during intravenous contrast administration. CONTRAST:  23m OMNIPAQUE IOHEXOL 300 MG/ML  SOLN COMPARISON:  CT chest 03/11/2016 FINDINGS: Cardiovascular: Normal heart size. Aortic atherosclerosis. Calcification in the left main, lad and left circumflex coronary arteries. Mediastinum/Nodes: Normal appearance of the thyroid gland. The trachea appears patent and is midline. No enlarged axillary, supraclavicular, mediastinal or hilar adenopathy. Lungs/Pleura: Small bilateral pleural effusions identified. Subsegmental atelectasis is noted in the lingula and both lower lobes. No airspace consolidation or pneumothorax identified. No suspicious pulmonary nodules. Upper Abdomen: No acute abnormality identified. Musculoskeletal: Mixed lytic and sclerotic bone metastasis is identified involving the T2 vertebra, image 108/9. T9 and T10 metastasis are also identified. Sclerotic lesion within the left seventh rib is identified, image number 94/5. IMPRESSION: 1. No suspicious pulmonary nodules identified 2. Small bilateral pleural effusions. 3. Mixed lytic and sclerotic bone metastasis involving the T2 vertebra, T9 and T10 vertebra. See report from MRI dated 02/27/2018. 4. Sclerotic lesion within the left seventh rib is  identified. Suspicious for metastasis. 5. Multi vessel coronary artery atherosclerotic calcifications. Aortic Atherosclerosis (ICD10-I70.0). Electronically Signed   By: Kerby Moors M.D.   On: 02/27/2018 23:10   Mr Thoracic Spine W Wo Contrast  Result Date: 02/27/2018 CLINICAL DATA:  He does occasionally have nausea and the pain is 10 out of 10 but does not seem to be exacerbated by food. It does seem to be worse at night when he lays down and when he sits up in the morning.  EXAM: MRI THORACIC AND LUMBAR SPINE WITHOUT AND WITH CONTRAST TECHNIQUE: Multiplanar and multiecho pulse sequences of the thoracic and lumbar spine were obtained without and with intravenous contrast. CONTRAST:  80m MULTIHANCE GADOBENATE DIMEGLUMINE 529 MG/ML IV SOLN COMPARISON:  None. FINDINGS: MRI THORACIC SPINE FINDINGS Alignment:  Physiologic. Vertebrae: Vertebral body heights are maintained. No acute fracture or static listhesis. 13 mm enhancing bone lesion in the left posterior T2 vertebral body. Heterogeneous marrow edema and T1 hypointensity throughout the T10 vertebral body with heterogeneous enhancement. Small bone lesion in the posterior T5 vertebral body. Small 8 mm bone lesion in the T9 vertebral body. Small Schmorl's node along the superior endplate of TG50with surrounding marrow edema without significant enhancement as can be seen with painful Schmorl's node. Cord:  Normal signal and morphology. Paraspinal and other soft tissues: No acute paraspinal abnormality. Disc levels: Disc spaces: Disc spaces are maintained T1-T2: No disc protrusion, foraminal stenosis or central canal stenosis. T2-T3: No disc protrusion, foraminal stenosis or central canal stenosis. T3-T4: No disc protrusion, foraminal stenosis or central canal stenosis. T4-T5: No disc protrusion, foraminal stenosis or central canal stenosis. T5-T6: No disc protrusion, foraminal stenosis or central canal stenosis. T6-T7: No disc protrusion, foraminal stenosis or central canal stenosis. T7-T8: No disc protrusion, foraminal stenosis or central canal stenosis. T8-T9: No disc protrusion, foraminal stenosis or central canal stenosis. T9-T10: No disc protrusion, foraminal stenosis or central canal stenosis. T10-T11: No disc protrusion, foraminal stenosis or central canal stenosis. T11-T12: No disc protrusion, foraminal stenosis or central canal stenosis. MRI LUMBAR SPINE FINDINGS Segmentation:  Standard. Alignment:  Physiologic. Vertebrae: No acute  fracture. No discitis. Small bone lesion in the posterior L3 vertebral body. Small bone lesion in the L1 vertebral body. Small focal bone lesions in the L4 vertebral body. Conus medullaris: Extends to the T12 level and appears normal. Abnormal dural enhancement extending from approximately T7 through T11 most significant at T9 through T11 which may be reactive versus secondary to tumor infiltration. Paraspinal and other soft tissues: No acute paraspinal abnormality. Disc levels: Disc spaces: Degenerative disc disease with disc height loss at L4-5 with reactive endplate changes. T12-L1: No significant disc bulge. No evidence of neural foraminal stenosis. No central canal stenosis. L1-L2: No significant disc bulge. No evidence of neural foraminal stenosis. No central canal stenosis. L2-L3: No significant disc bulge. No evidence of neural foraminal stenosis. No central canal stenosis. Mild left facet arthropathy. L3-L4: Broad-based disc bulge. Mild bilateral facet arthropathy. Moderate spinal stenosis. No evidence of neural foraminal stenosis. L4-L5: Broad-based disc bulge eccentric towards the left. Mild bilateral facet arthropathy with a small left facet effusion. Severe left foraminal stenosis. Mild right foraminal stenosis. L5-S1: Mild broad-based disc bulge. Mild bilateral facet arthropathy. Mild bilateral foraminal stenosis. No evidence of neural foraminal stenosis. No central canal stenosis. IMPRESSION: THORACIC SPINE 1. Abnormal bone lesions in the T2, T5, T9 and T10 vertebral bodies most concerning for metastatic disease versus multiple myeloma. Abnormal dural enhancement extending from approximately T7 through  T11 most significant at T9 through T11 which may be reactive versus secondary to tumor infiltration. 2. Small Schmorl's node along the superior endplate of A41 with surrounding marrow edema without significant enhancement as can be seen with painful Schmorl's node. LUMBAR SPINE 1. Abnormal bone lesions  involving the L1, L3 and L4 vertebral bodies most concerning for metastatic disease versus multiple myeloma. 2. At L5-S1 there is a mild broad-based disc bulge. Mild bilateral facet arthropathy. Mild bilateral foraminal stenosis. Electronically Signed   By: Kathreen Devoid   On: 02/27/2018 11:40   Mr Lumbar Spine W Wo Contrast  Result Date: 02/27/2018 CLINICAL DATA:  He does occasionally have nausea and the pain is 10 out of 10 but does not seem to be exacerbated by food. It does seem to be worse at night when he lays down and when he sits up in the morning. EXAM: MRI THORACIC AND LUMBAR SPINE WITHOUT AND WITH CONTRAST TECHNIQUE: Multiplanar and multiecho pulse sequences of the thoracic and lumbar spine were obtained without and with intravenous contrast. CONTRAST:  13m MULTIHANCE GADOBENATE DIMEGLUMINE 529 MG/ML IV SOLN COMPARISON:  None. FINDINGS: MRI THORACIC SPINE FINDINGS Alignment:  Physiologic. Vertebrae: Vertebral body heights are maintained. No acute fracture or static listhesis. 13 mm enhancing bone lesion in the left posterior T2 vertebral body. Heterogeneous marrow edema and T1 hypointensity throughout the T10 vertebral body with heterogeneous enhancement. Small bone lesion in the posterior T5 vertebral body. Small 8 mm bone lesion in the T9 vertebral body. Small Schmorl's node along the superior endplate of TY60with surrounding marrow edema without significant enhancement as can be seen with painful Schmorl's node. Cord:  Normal signal and morphology. Paraspinal and other soft tissues: No acute paraspinal abnormality. Disc levels: Disc spaces: Disc spaces are maintained T1-T2: No disc protrusion, foraminal stenosis or central canal stenosis. T2-T3: No disc protrusion, foraminal stenosis or central canal stenosis. T3-T4: No disc protrusion, foraminal stenosis or central canal stenosis. T4-T5: No disc protrusion, foraminal stenosis or central canal stenosis. T5-T6: No disc protrusion, foraminal stenosis  or central canal stenosis. T6-T7: No disc protrusion, foraminal stenosis or central canal stenosis. T7-T8: No disc protrusion, foraminal stenosis or central canal stenosis. T8-T9: No disc protrusion, foraminal stenosis or central canal stenosis. T9-T10: No disc protrusion, foraminal stenosis or central canal stenosis. T10-T11: No disc protrusion, foraminal stenosis or central canal stenosis. T11-T12: No disc protrusion, foraminal stenosis or central canal stenosis. MRI LUMBAR SPINE FINDINGS Segmentation:  Standard. Alignment:  Physiologic. Vertebrae: No acute fracture. No discitis. Small bone lesion in the posterior L3 vertebral body. Small bone lesion in the L1 vertebral body. Small focal bone lesions in the L4 vertebral body. Conus medullaris: Extends to the T12 level and appears normal. Abnormal dural enhancement extending from approximately T7 through T11 most significant at T9 through T11 which may be reactive versus secondary to tumor infiltration. Paraspinal and other soft tissues: No acute paraspinal abnormality. Disc levels: Disc spaces: Degenerative disc disease with disc height loss at L4-5 with reactive endplate changes. T12-L1: No significant disc bulge. No evidence of neural foraminal stenosis. No central canal stenosis. L1-L2: No significant disc bulge. No evidence of neural foraminal stenosis. No central canal stenosis. L2-L3: No significant disc bulge. No evidence of neural foraminal stenosis. No central canal stenosis. Mild left facet arthropathy. L3-L4: Broad-based disc bulge. Mild bilateral facet arthropathy. Moderate spinal stenosis. No evidence of neural foraminal stenosis. L4-L5: Broad-based disc bulge eccentric towards the left. Mild bilateral facet arthropathy with a small  left facet effusion. Severe left foraminal stenosis. Mild right foraminal stenosis. L5-S1: Mild broad-based disc bulge. Mild bilateral facet arthropathy. Mild bilateral foraminal stenosis. No evidence of neural foraminal  stenosis. No central canal stenosis. IMPRESSION: THORACIC SPINE 1. Abnormal bone lesions in the T2, T5, T9 and T10 vertebral bodies most concerning for metastatic disease versus multiple myeloma. Abnormal dural enhancement extending from approximately T7 through T11 most significant at T9 through T11 which may be reactive versus secondary to tumor infiltration. 2. Small Schmorl's node along the superior endplate of Z61 with surrounding marrow edema without significant enhancement as can be seen with painful Schmorl's node. LUMBAR SPINE 1. Abnormal bone lesions involving the L1, L3 and L4 vertebral bodies most concerning for metastatic disease versus multiple myeloma. 2. At L5-S1 there is a mild broad-based disc bulge. Mild bilateral facet arthropathy. Mild bilateral foraminal stenosis. Electronically Signed   By: Kathreen Devoid   On: 02/27/2018 11:40   Nm Hepatobiliary Liver Func  Result Date: 02/27/2018 CLINICAL DATA:  Cholelithiasis.  Right flank pain. EXAM: NUCLEAR MEDICINE HEPATOBILIARY IMAGING TECHNIQUE: Sequential images of the abdomen were obtained out to 60 minutes following intravenous administration of radiopharmaceutical. RADIOPHARMACEUTICALS:  5.28 mCi Tc-50m Choletec IV COMPARISON:  None. FINDINGS: Prompt uptake and biliary excretion of activity by the liver is seen. Gallbladder activity is visualized, consistent with patency of cystic duct. Biliary activity passes into small bowel, consistent with patent common bile duct. IMPRESSION: The gallbladder fills normally on today's study. No abnormalities identified. Electronically Signed   By: DDorise BullionIII M.D   On: 02/27/2018 17:37   Ct Abdomen Pelvis W Contrast  Result Date: 02/26/2018 CLINICAL DATA:  Right upper quadrant abdomen pain for 1 week. EXAM: CT ABDOMEN AND PELVIS WITH CONTRAST TECHNIQUE: Multidetector CT imaging of the abdomen and pelvis was performed using the standard protocol following bolus administration of intravenous  contrast. CONTRAST:  1016mOMNIPAQUE IOHEXOL 300 MG/ML  SOLN COMPARISON:  March 17, 2013 FINDINGS: Lower chest: Mild dependent atelectasis of posterior lung bases are noted. The heart size is normal. Hepatobiliary: There is diffuse low density of the liver. No focal liver lesions identified. Gallstones identified in the gallbladder. There is no inflammatory change around gallbladder. The biliary tree is normal. Pancreas: Unremarkable. No pancreatic ductal dilatation or surrounding inflammatory changes. Spleen: Normal in size without focal abnormality. Adrenals/Urinary Tract: The adrenal glands are normal. There are multiple cysts in bilateral kidneys. No hydronephrosis is identified bilaterally. The bladder is normal. Stomach/Bowel: Stomach is within normal limits. Appendix appears normal. No evidence of bowel wall thickening, distention, or inflammatory changes. Vascular/Lymphatic: Aortic atherosclerosis. No enlarged abdominal or pelvic lymph nodes. Reproductive: Prostate is unremarkable. Other: No abdominal wall hernia or abnormality. No abdominopelvic ascites. Musculoskeletal: There is 2.7 x 2.8 cm sclerotic lesion in the T10 vertebral body, new since the prior exam. Degenerative joint changes of the spine are noted. IMPRESSION: No acute abnormality identified in the abdomen and pelvis. 2.8 cm focal sclerotic lesion in the T10 vertebral body, new compared to prior CT of March 17, 2013. Clinical correlation regarding history of primary neoplasm is recommended as metastatic lesion is a differential possibility. Mild fatty infiltration of liver. Cholelithiasis without evidence of cholecystitis on CT. Cyst in both kidneys. Electronically Signed   By: WeAbelardo Diesel.D.   On: 02/26/2018 16:53   UsKoreabdomen Limited Ruq  Result Date: 02/26/2018 CLINICAL DATA:  Right upper quadrant abdomen pain for 1 week. EXAM: ULTRASOUND ABDOMEN LIMITED RIGHT UPPER QUADRANT COMPARISON:  None. FINDINGS: Gallbladder: No wall  thickening visualized. Gallstone is noted. The ultrasonographer reports mild sonographic Murphy sign. Common bile duct: Diameter: 4.7 mm Liver: No focal lesion identified. Within normal limits in parenchymal echogenicity. Portal vein is patent on color Doppler imaging with normal direction of blood flow towards the liver. IMPRESSION: Gallstone identified in the gallbladder. The ultrasonographer reports mild sonographic Murphy sign. The findings are equivocal for acute cholecystitis. Clinical correlation is recommended. Electronically Signed   By: Abelardo Diesel M.D.   On: 02/26/2018 19:09    Anti-infectives: Anti-infectives (From admission, onward)   Start     Dose/Rate Route Frequency Ordered Stop   02/26/18 2200  valACYclovir (VALTREX) tablet 500 mg     500 mg Oral Daily 02/26/18 2026     02/26/18 2145  elvitegravir-cobicistat-emtricitabine-tenofovir (GENVOYA) 150-150-200-10 MG tablet 1 tablet     1 tablet Oral Daily with breakfast 02/26/18 2026         Assessment/Plan Gallstones  -patient's pain does not seem c/w biliary colic.  His pain is throughout the right side of his abdomen around the flank and not just focused in the RUQ -CT of the chest confirms lytic lesions in T2, 9, 10.  These may be causing some radiating pain. -HIDA scan is negative for acute cholecystitis. -no plans for surgical intervention at this time. -we will sign off.  Patient can follow up for gallstones if he continues to have problems.  Prostate cancer with possible bone metastasis  History of lung nodules  History of heavy smoking  Diabetes mellitus with neuropathy  Hypertension  Moderate COPD  History of pulmonary embolism takes Xarelto at home but Xarelto on hold for now  Major depressive disorder on Celexa and quetiapine  HIV positive  Normocytic anemia, chronic   FEN - heart healthy diet VTE - xarelto ID - none   LOS: 2 days    Henreitta Cea , [facility] 02/28/2018, 9:28  AM Pager: [phone]

## 2018-02-28 NOTE — Evaluation (Signed)
Occupational Therapy Evaluation and Discharge Patient Details Name: Jacob Joseph MRN: [id number] DOB: [birth year] Today's Date: 02/28/2018    History of Present Illness (P) Mr. Rietz is a 62 year old African-American gentleman with past past medical history significant for prostate cancer (dx 1.5 years ago), HIV, tobacco use disorder, polysubstance abuse, T2DM, hypertension, moderate COPD, pulmonary embolism, anemia, MDD, chronic low back pain, CKD II, hyperlipidemia presenting with right side/flank pain and lower back pain. MRI: multiple lesions in the thoracic spine at T2, 5, 9, and 10 vertebral bodies and additional dural enhancement extending from T7-T11 concerning for tumor infiltration.  Additional lesion seen in the lumbar spine at L1, 3, and 4.  Broad-based disc bulge at L5-S1 which probably accounted for his previous chronic back pain.   Clinical Impression   This 62 yo male admitted with above presents to acute OT at a minguard A level when up on his feet and intermittent A for LBADLs depending on how is pain is at a given time. He has minguard A and prn A for LBADLs from his wife--and pt has all needed DME. No further OT needs, we will sign off.    Follow Up Recommendations  No OT follow up;Supervision/Assistance - 24 hour    Equipment Recommendations  None recommended by OT       Precautions / Restrictions Precautions Precautions: (P) Fall(Simultaneous filing. User may not have seen previous data.) Restrictions Weight Bearing Restrictions: (P) No(Simultaneous filing. User may not have seen previous data.)      Mobility Bed Mobility     General bed mobility comments: Pt up in recliner upon arrival  Transfers Overall transfer level: Needs assistance Equipment used: Rolling walker (2 wheeled) Transfers: Sit to/from Stand Sit to Stand: Min guard              Balance Overall balance assessment: Needs assistance Sitting-balance support: No upper extremity  supported;Feet supported Sitting balance-Leahy Scale: Good     Standing balance support: No upper extremity supported;During functional activity Standing balance-Leahy Scale: Fair                             ADL either performed or assessed with clinical judgement   ADL Overall ADL's : Needs assistance/impaired Eating/Feeding: Independent;Sitting   Grooming: Min guard;Standing   Upper Body Bathing: Set up;Sitting   Lower Body Bathing: Min guard;Sit to/from stand   Upper Body Dressing : Set up;Sitting   Lower Body Dressing: Min guard;Sit to/from stand Lower Body Dressing Details (indicate cue type and reason): We did dicuss the sequence of getting dressed that would make it more efficient and safer, pt can cross his legs to get to his feet Toilet Transfer: Min guard;Ambulation;RW   Toileting- Water quality scientist and Hygiene: Min guard;Sit to/from stand               Vision Patient Visual Report: No change from baseline              Pertinent Vitals/Pain Pain Assessment: 0-10 Pain Score: (10 with movement , 5 at rest) Faces Pain Scale: (P) Hurts even more Pain Location: right side of mid back and right flank Pain Descriptors / Indicators: Grimacing;Sore(deep, sharp at times) Pain Intervention(s): Limited activity within patient's tolerance;Monitored during session;Repositioned;Patient requesting pain meds-RN notified     Hand Dominance Right   Extremity/Trunk Assessment Upper Extremity Assessment Upper Extremity Assessment: RUE deficits/detail RUE Deficits / Details: limited shoulder flexion/abduction due to right flank and mid  back pain RUE Coordination: decreased gross motor     Communication Communication Communication: No difficulties   Cognition Arousal/Alertness: Awake/alert Behavior During Therapy: WFL for tasks assessed/performed Overall Cognitive Status: Within Functional Limits for tasks assessed                                                 Home Living Family/patient expects to be discharged to:: (P) Private residence(Simultaneous filing. User may not have seen previous data.) Living Arrangements: Spouse/significant other Available Help at Discharge: Family;Available 24 hours/day Type of Home: Apartment Home Access: Stairs to enter Entrance Stairs-Number of Steps: 2 Entrance Stairs-Rails: Right;Left;Can reach both Home Layout: One level     Bathroom Shower/Tub: Teacher, early years/pre: Standard     Home Equipment: Bedside commode;Walker - 2 wheels;Walker - 4 wheels;Cane - single point;Shower seat;Grab bars - tub/shower          Prior Functioning/Environment Level of Independence: Needs assistance  Gait / Transfers Assistance Needed: uses 4 wheeled RW or SPC when out and about ADL's / Homemaking Assistance Needed: uses 3n1 over toilet, uses tub seat in shower, wife A him with LB ADLs prn (depending on how much pain he is in)            OT Problem List: Decreased range of motion;Pain         OT Goals(Current goals can be found in the care plan section) Acute Rehab OT Goals Patient Stated Goal: to get pain better and go home  OT Frequency:                AM-PAC PT "6 Clicks" Daily Activity     Outcome Measure Help from another person eating meals?: None Help from another person taking care of personal grooming?: A Little Help from another person toileting, which includes using toliet, bedpan, or urinal?: A Little Help from another person bathing (including washing, rinsing, drying)?: A Little Help from another person to put on and taking off regular upper body clothing?: A Little Help from another person to put on and taking off regular lower body clothing?: A Little 6 Click Score: 19   End of Session Equipment Utilized During Treatment: Rolling walker  Activity Tolerance: Patient tolerated treatment well Patient left: in chair;with call bell/phone within  reach  OT Visit Diagnosis: Unsteadiness on feet (R26.81);Pain Pain - Right/Left: Right Pain - part of body: (mid back and flank)                Time: 4008-6761 OT Time Calculation (min): 18 min Charges:  OT General Charges $OT Visit: 1 Visit OT Evaluation $OT Eval Moderate Complexity: [address], [place] [phone] 02/28/2018

## 2018-02-28 NOTE — Telephone Encounter (Signed)
I discussed this situation with Cassie Kuppelweiser and agree completely with her assessment and plan.  I think it would be easier and safer to change his anticoagulation rather than changing his long-term antiretroviral regimen.

## 2018-02-28 NOTE — Care Management Important Message (Signed)
Important Message  Patient Details  Name: Jacob Joseph MRN: [id number] Date of Birth: Dec 30, 1955   Medicare Important Message Given:  Yes    Orbie Pyo 02/28/2018, 4:28 PM

## 2018-03-01 ENCOUNTER — Inpatient Hospital Stay (HOSPITAL_COMMUNITY): Payer: Medicare HMO

## 2018-03-01 ENCOUNTER — Other Ambulatory Visit: Payer: Self-pay | Admitting: Pharmacist

## 2018-03-01 DIAGNOSIS — M7989 Other specified soft tissue disorders: Secondary | ICD-10-CM

## 2018-03-01 DIAGNOSIS — I82431 Acute embolism and thrombosis of right popliteal vein: Secondary | ICD-10-CM

## 2018-03-01 DIAGNOSIS — I82432 Acute embolism and thrombosis of left popliteal vein: Secondary | ICD-10-CM

## 2018-03-01 DIAGNOSIS — I2699 Other pulmonary embolism without acute cor pulmonale: Secondary | ICD-10-CM

## 2018-03-01 DIAGNOSIS — Z86718 Personal history of other venous thrombosis and embolism: Secondary | ICD-10-CM

## 2018-03-01 DIAGNOSIS — J449 Chronic obstructive pulmonary disease, unspecified: Secondary | ICD-10-CM

## 2018-03-01 DIAGNOSIS — N183 Chronic kidney disease, stage 3 (moderate): Secondary | ICD-10-CM

## 2018-03-01 DIAGNOSIS — R296 Repeated falls: Secondary | ICD-10-CM | POA: Diagnosis not present

## 2018-03-01 LAB — MULTIPLE MYELOMA PANEL, SERUM
ALBUMIN SERPL ELPH-MCNC: 3.1 g/dL (ref 2.9–4.4)
ALPHA2 GLOB SERPL ELPH-MCNC: 1.3 g/dL — AB (ref 0.4–1.0)
Albumin/Glob SerPl: 0.7 (ref 0.7–1.7)
Alpha 1: 0.3 g/dL (ref 0.0–0.4)
B-GLOBULIN SERPL ELPH-MCNC: 1.1 g/dL (ref 0.7–1.3)
Gamma Glob SerPl Elph-Mcnc: 1.9 g/dL — ABNORMAL HIGH (ref 0.4–1.8)
Globulin, Total: 4.6 g/dL — ABNORMAL HIGH (ref 2.2–3.9)
IGG (IMMUNOGLOBIN G), SERUM: 1914 mg/dL — AB (ref 700–1600)
IGM (IMMUNOGLOBULIN M), SRM: 31 mg/dL (ref 20–172)
IgA: 241 mg/dL (ref 90–386)
TOTAL PROTEIN ELP: 7.7 g/dL (ref 6.0–8.5)

## 2018-03-01 LAB — COMPREHENSIVE METABOLIC PANEL
ALBUMIN: 2.8 g/dL — AB (ref 3.5–5.0)
ALT: 9 U/L (ref 0–44)
ANION GAP: 10 (ref 5–15)
AST: 16 U/L (ref 15–41)
Alkaline Phosphatase: 88 U/L (ref 38–126)
BUN: 8 mg/dL (ref 6–20)
CHLORIDE: 103 mmol/L (ref 98–111)
CO2: 25 mmol/L (ref 22–32)
CREATININE: 1.31 mg/dL — AB (ref 0.61–1.24)
Calcium: 8.9 mg/dL (ref 8.9–10.3)
GFR calc non Af Amer: 58 mL/min — ABNORMAL LOW (ref >60)
GLUCOSE: 126 mg/dL — AB (ref 70–99)
Potassium: 3.6 mmol/L (ref 3.5–5.1)
SODIUM: 138 mmol/L (ref 135–145)
Total Bilirubin: 0.3 mg/dL (ref 0.3–1.2)
Total Protein: 7.3 g/dL (ref 6.5–8.1)

## 2018-03-01 LAB — TESTOSTERONE,FREE AND TOTAL
TESTOSTERONE FREE: 2.4 pg/mL — AB (ref 6.6–18.1)
Testosterone: 16 ng/dL — ABNORMAL LOW (ref 264–916)

## 2018-03-01 MED ORDER — GABAPENTIN 300 MG PO CAPS: 600.0000 mg | ORAL_CAPSULE | Freq: Two times a day (BID) | ORAL | 1 refills | Status: DC

## 2018-03-01 MED ORDER — EDOXABAN TOSYLATE 60 MG PO TABS: 60.0000 mg | ORAL_TABLET | Freq: Every day | ORAL | 3 refills | Status: DC

## 2018-03-01 MED ORDER — OXYCODONE HCL 10 MG PO TABS: 10.0000 mg | ORAL_TABLET | Freq: Four times a day (QID) | ORAL | 0 refills | Status: DC | PRN

## 2018-03-01 MED ORDER — APIXABAN 2.5 MG PO TABS: 2.5000 mg | ORAL_TABLET | Freq: Two times a day (BID) | ORAL | Status: DC

## 2018-03-01 MED ORDER — APIXABAN 5 MG PO TABS: 5.0000 mg | ORAL_TABLET | Freq: Two times a day (BID) | ORAL | Status: DC

## 2018-03-01 MED ORDER — POLYETHYLENE GLYCOL 3350 17 G PO PACK: 17.0000 g | PACK | Freq: Every day | ORAL | 0 refills | Status: DC

## 2018-03-01 MED ORDER — TECHNETIUM TC 99M MEDRONATE IV KIT
20.0000 | PACK | Freq: Once | INTRAVENOUS | Status: AC | PRN
Start: 2018-03-01 — End: 2018-03-01
  Administered 2018-03-01: 20 via INTRAVENOUS

## 2018-03-01 MED ORDER — APIXABAN 2.5 MG PO TABS: 2.5000 mg | ORAL_TABLET | Freq: Two times a day (BID) | ORAL | 1 refills | Status: DC

## 2018-03-01 MED ORDER — APIXABAN 5 MG PO TABS: 5.0000 mg | ORAL_TABLET | Freq: Two times a day (BID) | ORAL | 0 refills | Status: DC

## 2018-03-01 MED ORDER — METFORMIN HCL 500 MG PO TABS: 500.0000 mg | ORAL_TABLET | Freq: Every day | ORAL | 0 refills | Status: DC

## 2018-03-01 NOTE — Progress Notes (Addendum)
Subjective: Jacob Joseph was somnolent on exam this morning most likely due to his pain management. He said he was still feeling right flank pain but it was better controlled with the adjusted gabapentin and oxy IR. He understood that he needs to follow up with his PCP and oncologist.   He was seen again this afternoon and complained of right foot swelling, numbness on the dorsal aspect of his right foot and calf pain. He said he has a history of DVT's and presented the same way. He was not able to flex his foot due to pain/swelling.   Objective:  Vital signs in last 24 hours: Vitals:   02/28/18 1937 02/28/18 2141 03/01/18 0511 03/01/18 0858  BP:  132/88 (!) 157/91   Pulse:  89 92   Resp:   18   Temp:  (!) 97.3 F (36.3 C) 97.9 F (36.6 C)   TempSrc:  Oral Oral   SpO2: 96% 97% 96% 100%  Weight:      Height:       Physical Exam  Constitutional: He is well-developed, well-nourished, and in no distress.  Cardiovascular: Normal rate, regular rhythm and normal heart sounds.  No murmur heard. Pulmonary/Chest: Effort normal and breath sounds normal. No respiratory distress.  Extremities: right calf tenderness, right foot swelling, diminished sensation of dorsum right foot  Assessment/Plan:  Active Problems:   HIV disease (HCC)   Benign essential HTN   Diabetes mellitus treated with oral medication (HCC)   Hx of pulmonary embolus   Right upper quadrant pain   Abnormal computed tomography of thoracic spine   Bone lesion   Calculus of gallbladder without cholecystitis without obstruction   Chronic anticoagulation   Prostate cancer metastatic to multiple sites [facility])   Radicular pain of thoracic region   Essential hypertension  Jacob Joseph is a 62 year old male with a PMHx of prostate cancer(dx 1.5 years ago),HIV, tobacco use disorder, polysubstance abuse,T2DM, hypertension, COPD, pulmonary embolism, anemia,MDD,chronic low back pain,CKD II,hyperlipidemiapresenting with  right sided flank and abdominal pain. CT and MRI imaging are concerning for metastatic disease. NM total body scan today. Concerns for DVT in his right calf.  Acute DVT: Patient complained of right foot swelling, dorsal numbness and right calf pain. He said he has a history of DVT's that presented similarly. Venous doppler US showed evidence of acute DVT in the popliteal vein. --increase Eliquis dose, 5 mg BID x15 doses --followed by Eliquis 2.5 mg BID   Right sided flank pain: Patient is still having right flank pain but better controlled with increased gabapentin and scheduled oxy IR. Will prescribe pain medications and increased gabapentin for pain control until he can be further evaluated and managed by oncology. -continue gabapentin 600 mg BID; room to escalate if not optimal -continue oxycodone 10 mg  -continue miralax to help constipation related to opioids  -f/u with PCP and Oncology; appointments scheduled   NM Bone Scan Whole Body findings:  IMPRESSION: 1. There are new areas of abnormal radiotracer localization to bone as detailed above, involving the thoracic spine, left posterior sixth rib and superior right iliac crest, consistent with metastatic disease to bone. Another focal area of uptake projecting along the inferior, medial left orbit is more nonspecific and could be reactive or metastatic uptake.  Prostate cancer, concern for metastatic disease: Patient had a NM total body scan today, report pending. PSA elevated at 7.0, was 23.1 one year ago. -f/u with oncology; appointment scheduled  -Flomax 0.4 mg   Diabetes  mellitus with neuropathy:  -continue to hold metformin for another 48 hours due to contrast given today -continue gabapentin 600mg  PRN  Hypertension:Previously on lisinopril 5 mg, discontinued on 12/09/17 due to lightheadedness worse upon standing from a seated position.BP 142/87.  CKDIII: Stable. Cr of 1.31 near his baseline.  History of pulmonary  embolism:Patient counseled by pharmacy on anticoagulation therapy changes.  -continue Eliquis at reduced dose, 2.5 mg PO BID -discontinue Xarelto  HIV: -continue Genvoya and valacyclovir   Dispo: Patient is medically stable for discharge.  Shashana Fullington N, DO 03/01/2018, 2:27 PM Pager: [phone]

## 2018-03-01 NOTE — Progress Notes (Signed)
Physical Therapy Treatment Patient Details Name: Jacob Joseph MRN: [id number] DOB: [birth year] Today's Date: 03/01/2018    History of Present Illness Mr. Urwin is a 62 year old African-American gentleman with past past medical history significant for prostate cancer (dx 1.5 years ago), HIV, tobacco use disorder, polysubstance abuse, T2DM, hypertension, moderate COPD, pulmonary embolism, anemia, MDD, chronic low back pain, CKD II, hyperlipidemia presenting with right side/flank pain and lower back pain. MRI: multiple lesions in the thoracic spine at T2, 5, 9, and 10 vertebral bodies and additional dural enhancement extending from T7-T11 concerning for tumor infiltration.  Additional lesion seen in the lumbar spine at L1, 3, and 4.  Broad-based disc bulge at L5-S1 which probably accounted for his previous chronic back pain.    PT Comments    Pt able to complete 4 steps with rail and min A though this left him very fatigued. Ambulated 140' with RW and min-guard A working on safety with use of RW. PT will continue to follow.    Follow Up Recommendations  Home health PT;Supervision for mobility/OOB     Equipment Recommendations  None recommended by PT    Recommendations for Other Services       Precautions / Restrictions Precautions Precautions: Fall Restrictions Weight Bearing Restrictions: No    Mobility  Bed Mobility               General bed mobility comments: Pt up in recliner upon arrival  Transfers Overall transfer level: Needs assistance Equipment used: Rolling walker (2 wheeled) Transfers: Sit to/from Stand Sit to Stand: Supervision         General transfer comment: pt needed increased time but no physical assist  Ambulation/Gait Ambulation/Gait assistance: Min guard Gait Distance (Feet): 140 Feet Assistive device: Rolling walker (2 wheeled) Gait Pattern/deviations: Step-through pattern;Decreased stride length;Wide base of support Gait velocity:  Decreased Gait velocity interpretation: <1.8 ft/sec, indicate of risk for recurrent falls General Gait Details: occasional stepping outside of RW, vc's for correcting this to prevent fall   Stairs Stairs: Yes Stairs assistance: Min assist Stair Management: One rail Left;Forwards;Step to pattern Number of Stairs: 4 General stair comments: pt with notable fatigue in LE's after 3rd step. Min HHA on non rail side for support   Wheelchair Mobility    Modified Rankin (Stroke Patients Only)       Balance Overall balance assessment: Needs assistance Sitting-balance support: No upper extremity supported;Feet supported Sitting balance-Leahy Scale: Good     Standing balance support: No upper extremity supported;During functional activity Standing balance-Leahy Scale: Fair Standing balance comment: Pt reliant on RW for balance. Pt had difficulty with static standing without BUE support.                             Cognition Arousal/Alertness: Awake/alert Behavior During Therapy: WFL for tasks assessed/performed Overall Cognitive Status: Within Functional Limits for tasks assessed                                        Exercises      General Comments General comments (skin integrity, edema, etc.): discussed activity level upon return home and exercises program      Pertinent Vitals/Pain Pain Assessment: 0-10 Pain Score: 8  Pain Location: right side of mid back and right flank Pain Descriptors / Indicators: Grimacing;Sore;Aching Pain Intervention(s): Limited activity within patient's tolerance;Monitored during  session;Patient requesting pain meds-RN notified    Home Living                      Prior Function            PT Goals (current goals can now be found in the care plan section) Acute Rehab PT Goals Patient Stated Goal: to get pain better and go home PT Goal Formulation: With patient Time For Goal Achievement:  03/14/18 Potential to Achieve Goals: Good Progress towards PT goals: Progressing toward goals    Frequency    Min 3X/week      PT Plan Current plan remains appropriate    Co-evaluation              AM-PAC PT "6 Clicks" Daily Activity  Outcome Measure  Difficulty turning over in bed (including adjusting bedclothes, sheets and blankets)?: A Little Difficulty moving from lying on back to sitting on the side of the bed? : A Little Difficulty sitting down on and standing up from a chair with arms (e.g., wheelchair, bedside commode, etc,.)?: A Little Help needed moving to and from a bed to chair (including a wheelchair)?: A Little Help needed walking in hospital room?: A Little Help needed climbing 3-5 steps with a railing? : A Little 6 Click Score: 18    End of Session Equipment Utilized During Treatment: Gait belt Activity Tolerance: Patient limited by fatigue Patient left: in chair;with call bell/phone within reach Nurse Communication: Mobility status PT Visit Diagnosis: Unsteadiness on feet (R26.81);Other abnormalities of gait and mobility (R26.89);Muscle weakness (generalized) (M62.81);Difficulty in walking, not elsewhere classified (R26.2);Pain Pain - Right/Left: Right Pain - part of body: (Abdomen)     Time: 2831-5176 PT Time Calculation (min) (ACUTE ONLY): 15 min  Charges:  $Gait Training: 8-22 mins                     Leighton Roach, PT  Acute Rehab Services  [place] 03/01/2018, 3:14 PM

## 2018-03-01 NOTE — Progress Notes (Signed)
Right lower extremity venous duplex has been completed. There is evidence of acute deep vein thrombosis involving the popliteal vein of the right lower extremity. Results were given to the patient's nurse, Blanch Media.  03/01/18 4:35 PM Carlos Levering RVT

## 2018-03-01 NOTE — Progress Notes (Signed)
Price check on Savaysa, quoted as $400 copay per [facility] and therefore cancelled Savaysa. Also discontinued Xarelto, as pharmacy stated they were working on filling this. Patient is being switched to apixaban.

## 2018-03-01 NOTE — Consult Note (Addendum)
[facility] CM Primary Care Navigator  03/01/2018  Jacob Joseph [birth year] [id number]   Went to see patient at the bedside to identify possible discharge needs but he is in the bathroom taking a bath per staff report.  Will attempt to see patient at another time when available in the room.    Addendum:  Went back to seepatient in the roomto identify possible discharge needs.  Patient reportsthat hehad "chest pain and sharp right sided pain" that had led to this admission. (history of prostate cancer with concern for bony metastasis).  Patient endorses [facility] Neva Seat [facility] as his[facility].   [facility] [place] [facility] and [facility] on [address] to obtain medications without difficulty.  Patient reportsthat wife Arelia Sneddon) has been  managing hismedications at homeusing"pill box" systemfilledonce a week.   Patientdoes not drive and shared using [employer] transportation or [employer] bus fortransportationto his doctors'appointments.  [employer] transportation benefits discussed with patient as well.  Patient lives with wife who serves as his primary caregiver at home. Patient mentioned that he has a home aide coming to the house Mon to Fri, 2- 3 hours per day to assist him.   Anticipated discharge plan is home with home health services per therapy recommendation.  Patient voiced understanding to call primary care provider's office whenhe gets backhome for a post discharge follow-up appointment within1- 2 weeks or sooner if needed.Patient letter (with PCP's contact number) wasprovided as a reminder.  Patient is a 62 year old male with history of prostate cancer,HIV, tobacco use disorder, polysubstance abuse,DM II, hypertension, COPD, pulmonary embolism, anemia,MDD,chronic low back pain,chronic kidney disease II andhyperlipidemia.  Patient is on inhalers  for COPD and states managing it well with no pressing issues. COPD action plan discussed with patient. He is on Metformin 500 mg daily at home for DM. Previously on lisinopril 5 mg, discontinued on 12/09/17 due to lightheadedness worse upon standing from a seated position.Patient reports managing his chronic conditions by following-up with providers, walking 2- 3 times a week (exercise), taking medications as ordered, monitoring blood pressure once a week; blood sugar check occasionally after provider-told him that daily blood sugar check is not necessary (recent A1c of 6.6).  He admits needing to gain further knowledge and information regarding proper management of blood pressure and blood sugars especially with diet (foods to eat and foods to avoid) since he eats what his wife cooks without paying much attention with restrictions. Patient verbalized that both wife and him need further education and reinforcement on diet restrictions for his health conditions.  Explained to patientregardingTHN care management servicesand resourcesavailable for himandhestated interest forit.Patient mentioned he that would prefer phone calls for now to get information. Patienthadverbally agreed and opted for [facility]to [facility] for further education regarding proper management of blood pressure and blood sugar particularly diet restrictions and reinforcement,after he gets back home.  Referral made to [place] knowledge and information regarding proper management of blood pressure and blood sugars through diet restrictions and reinforcement and for further education on disease management of health conditions (mainly DM/ HTN) after discharge.  THNcare managementinformation provided for future needs thathemay have.    Addendum: (03/02/18):  Received an in-basket message update from [facility] regarding patient's re-verification of eligibility and was informed that patient is not  eligible for [facility] services.  Call placed to patient's primary care provider's office [facility]) to notify of patient's discharge, need for post  hospital follow-up and transition of care as well as ineligibility to [facility] services.  Made aware of patient's interest for further education/ information on proper management of blood pressure and blood sugar (mainly diet restrictions) that needs to be addressed.   For additional questions please contact:  Edwena Felty A. Charvez Voorhies, BSN, RN-BC [facility] PRIMARY CARE Navigator Cell: [phone]

## 2018-03-01 NOTE — Discharge Instructions (Signed)
Mr. Ates,  Please follow up with your PCP on Thursday 8/15 at 145 pm and follow up with Dr. Alen Blew at the cancer center on 8/23 at 11 am.   Your pain medication and eliquis prescriptions have been sent to [facility] [address] on [address] were given a card to get discount for Eliquis. Your dosing for Eliquis will be 5mg  twice daily for 7 days, then 2.5mg  twice daily from then on.   If the pain medications cause you to feel overly drowsy, please STOP taking them and call your PCP.  Hold your metformin for 48 hours. Restart this medication on 03/04/18   Information on my medicine - ELIQUIS (apixaban)  Why was Eliquis prescribed for you? Eliquis was prescribed for you to reduce the risk of a blood clot forming that can cause a stroke if you have a medical condition called atrial fibrillation (a type of irregular heartbeat).  What do You need to know about Eliquis ? Take your Eliquis TWICE DAILY - one tablet in the morning and one tablet in the evening with or without food. If you have difficulty swallowing the tablet whole please discuss with your pharmacist how to take the medication safely.  Take Eliquis exactly as prescribed by your doctor and DO NOT stop taking Eliquis without talking to the doctor who prescribed the medication.  Stopping may increase your risk of developing a stroke.  Refill your prescription before you run out.  After discharge, you should have regular check-up appointments with your healthcare provider that is prescribing your Eliquis.  In the future your dose may need to be changed if your kidney function or weight changes by a significant amount or as you get older.  What do you do if you miss a dose? If you miss a dose, take it as soon as you remember on the same day and resume taking twice daily.  Do not take more than one dose of ELIQUIS at the same time to make up a missed dose.  Important Safety Information A possible side effect of Eliquis is  bleeding. You should call your healthcare provider right away if you experience any of the following: ? Bleeding from an injury or your nose that does not stop. ? Unusual colored urine (red or dark brown) or unusual colored stools (red or black). ? Unusual bruising for unknown reasons. ? A serious fall or if you hit your head (even if there is no bleeding).  Some medicines may interact with Eliquis and might increase your risk of bleeding or clotting while on Eliquis. To help avoid this, consult your healthcare provider or pharmacist prior to using any new prescription or non-prescription medications, including herbals, vitamins, non-steroidal anti-inflammatory drugs (NSAIDs) and supplements.  This website has more information on Eliquis (apixaban): http://www.eliquis.com/eliquis/home

## 2018-03-01 NOTE — Progress Notes (Signed)
Internal Medicine Attending  Date: 03/01/2018  Patient name: Jacob Joseph Medical record number: [id number] Date of birth: [birth year] Age: 62 y.o. Gender: male  I saw and evaluated the patient. I reviewed the resident's note by Dr. Laural Golden and I agree with the resident's findings and plans as documented in her progress note.  When seen on rounds this morning Jacob Joseph was feeling improved with regards to pain control. A bone scan demonstrated other metastatic lesions. His PSA is 7 despite hormonal therapy. He was found to have an acute right popliteal vein deep venous thrombosis and his Eliquis dose was increased to 5 mg by mouth twice daily for 15 doses. Please note this correction compared to Dr. Olevia Perches note.  That said, he is stable to be discharged home today with quick follow-up in the Internal Medicine Center as well as the [place] as scheduled.

## 2018-03-01 NOTE — Discharge Summary (Signed)
Name: Jacob Joseph MRN: [id number] DOB: May 01, 1956 62 y.o. PCP: Neva Seat, MD  Date of Admission: 02/26/2018 11:20 AM Date of Discharge: 03/01/18 Attending Physician: Oval Linsey, MD  Discharge Diagnosis: 1. Metastatic disease 2. Acute DVT   Discharge Medications: Allergies as of 03/01/2018      Reactions   Statins Other (See Comments)   Elevated liver enzymes.      Medication List    STOP taking these medications   edoxaban 60 MG Tabs tablet Commonly known as:  SAVAYSA   guaiFENesin-dextromethorphan 100-10 MG/5ML syrup Commonly known as:  ROBITUSSIN DM   nicotine 7 mg/24hr patch Commonly known as:  NICODERM CQ - dosed in mg/24 hr   nicotine polacrilex 2 MG lozenge Commonly known as:  COMMIT   ROLLATOR Misc     TAKE these medications   albuterol 108 (90 Base) MCG/ACT inhaler Commonly known as:  PROVENTIL HFA;VENTOLIN HFA INHALE 2 PUFFS INTO THE LUNGS EVERY 6 HOURS AS NEEDED FOR WHEEZING OR SHORTNESS OF BREATH, TO NOT USE EVERY DAY, USE ONLY IF NEEDED What changed:  Another medication with the same name was removed. Continue taking this medication, and follow the directions you see here.   apixaban 5 MG Tabs tablet Commonly known as:  ELIQUIS Take 1 tablet (5 mg total) by mouth 2 (two) times daily for 7 days. THEN   apixaban 2.5 MG Tabs tablet Commonly known as:  ELIQUIS Take 1 tablet (2.5 mg total) by mouth 2 (two) times daily. Starting 8/21 Start taking on:  03/09/2018   aspirin 81 MG EC tablet Take 1 tablet (81 mg total) by mouth daily.   azelastine 0.1 % nasal spray Commonly known as:  ASTELIN Place 1 spray into both nostrils 2 (two) times daily. Use in each nostril as directed What changed:    when to take this  reasons to take this  additional instructions   Budesonide 90 MCG/ACT inhaler Inhale 1-2 puffs into the lungs 2 (two) times daily.   cholecalciferol 1000 units tablet Commonly known as:  VITAMIN D Take 1 tablet (1,000  Units total) by mouth daily.   citalopram 20 MG tablet Commonly known as:  CELEXA Take 20 mg by mouth daily.   diclofenac sodium 1 % Gel Commonly known as:  VOLTAREN APPLY 4 GRAMS EXTERNALLY TO THE AFFECTED AREA FOUR TIMES DAILY AS NEEDED FOR PAIN What changed:  See the new instructions.   gabapentin 300 MG capsule Commonly known as:  NEURONTIN Take 2 capsules (600 mg total) by mouth 2 (two) times daily. What changed:  when to take this   GENVOYA 150-150-200-10 MG Tabs tablet Generic drug:  elvitegravir-cobicistat-emtricitabine-tenofovir TAKE 1 TABLET BY MOUTH DAILY WITH BREAKFAST What changed:  when to take this   glucose blood test strip Use as instructed ICD 10  E11.8, non-insulin dependent, test up to twice daily   hydroxypropyl methylcellulose / hypromellose 2.5 % ophthalmic solution Commonly known as:  ISOPTO TEARS / GONIOVISC Place 1 drop into both eyes as needed for dry eyes.   hydrOXYzine 50 MG capsule Commonly known as:  VISTARIL Take 50 mg by mouth at bedtime.   metFORMIN 500 MG tablet Commonly known as:  GLUCOPHAGE Take 1 tablet (500 mg total) by mouth daily with breakfast. Start taking on:  03/04/2018 What changed:    how much to take  how to take this  when to take this  additional instructions  These instructions start on 03/04/2018. If you are unsure what to do until  then, ask your doctor or other care provider.   ONETOUCH DELICA LANCETS FINE Misc 1 Stick by Does not apply route 2 (two) times daily. ICD 10  E11.8, non-insulin dependent, test up to twice daily   ONETOUCH VERIO w/Device Kit 1 Device by Does not apply route 2 (two) times daily. ICD 10  E11.8, non-insulin dependent, test up to twice daily   Oxycodone HCl 10 MG Tabs Take 1 tablet (10 mg total) by mouth every 6 (six) hours as needed for severe pain or breakthrough pain.   polyethylene glycol packet Commonly known as:  MIRALAX / GLYCOLAX Take 17 g by mouth daily. Start taking on:   03/02/2018   pravastatin 40 MG tablet Commonly known as:  PRAVACHOL Take 1 tablet (40 mg total) by mouth daily.   PREZISTA 800 MG tablet Generic drug:  darunavir TAKE 1 TABLET BY MOUTH EVERY DAY WITH BREAKFAST What changed:  See the new instructions.   QUEtiapine 300 MG tablet Commonly known as:  SEROQUEL Take 300 mg by mouth 2 (two) times daily.   tamsulosin 0.4 MG Caps capsule Commonly known as:  FLOMAX TAKE 1 CAPSULE(0.4 MG) BY MOUTH DAILY AFTER BREAKFAST What changed:    how much to take  how to take this  when to take this   Tiotropium Bromide-Olodaterol 2.5-2.5 MCG/ACT Aers Inhale 2 puffs into the lungs daily.   valACYclovir 500 MG tablet Commonly known as:  VALTREX TAKE 1 TABLET BY MOUTH DAILY       Disposition and follow-up:   [name] was discharged from [facility] in Stable condition.  At the hospital follow up visit please address:  1.  Metastatic disease: please discuss pain management and assess mental status on prescribed pain meds  2. Acute DVT: make sure patient is taking the proper dosing of Eliquis   2.  Labs / imaging needed at time of follow-up: none  3.  Pending labs/ test needing follow-up: testosterone lab  Follow-up Appointments: Follow-up Information    Jacob Portela, MD. Go on 03/11/2018.   Specialty:  Oncology Why:  11:00 am  Contact information: [place] [id number] [phone]        Neva Seat, MD. Daphane Shepherd on 03/03/2018.   Specialty:  Internal Medicine Why:  1:45 pm  Contact information: [place] [id number] [facility] Course by problem list: 1. Right upper flank pain- Patient presented with severe right flank pain that radiated to the RUQ. Imaging and lab work was negative for gallbladder disease but showed spinal lesions concerning for metastatic disease (primary prostate cancer diagnosed 1.5 years ago). Followed up with an  MRI and NM Total body scan that showed extensive metastatic disease. Follow up appointment scheduled for 8/23 with Dr. Alen Blew at the cancer center. He was discharged with pain medications to control his pain. 2. Acute DVT: on the day of discharge the patient reported right foot swelling, numbness and calf pain with a history of DVTs. Doppler US showed acute DVT. He was switched back to eliquis due to interactions of xarelto and his HIV medications. He was discharged with instructions to take eliquis 5 mg for 1 week and then to continue taking 2.5 mg after for long term therapy.  3. DM: Patient was well controlled during his admission. Held metformin due to contrast from HIDA and NM body scan. Continue to hold metformin for 48 hours since  discharge.   Discharge Vitals:   BP (!) 142/87 (BP Location: Left Arm)   Pulse (!) 114   Temp 98 F (36.7 C) (Oral)   Resp 17   Ht 6' (1.829 m)   Wt 97.5 kg   SpO2 100%   BMI 29.16 kg/m   Pertinent Labs, Studies, and Procedures:   CBC Latest Ref Rng & Units 02/26/2018 11/04/2017 09/23/2016  WBC 4.0 - 10.5 K/uL 9.6 6.4 14.3(H)  Hemoglobin 13.0 - 17.0 g/dL 10.3(L) 10.2(L) 11.1(L)  Hematocrit 39.0 - 52.0 % 33.0(L) 30.8(L) 34.5(L)  Platelets 150 - 400 K/uL 245 293 178   02/26/18 PSA: 7.0   02/28/18 Testosterone: 16  Ct Chest W Contrast  Result Date: 02/27/2018 CLINICAL DATA:  History of sarcoma. Evaluate for metastatic disease. EXAM: CT CHEST WITH CONTRAST TECHNIQUE: Multidetector CT imaging of the chest was performed during intravenous contrast administration. CONTRAST:  2m OMNIPAQUE IOHEXOL 300 MG/ML  SOLN COMPARISON:  CT chest 03/11/2016 FINDINGS: Cardiovascular: Normal heart size. Aortic atherosclerosis. Calcification in the left main, lad and left circumflex coronary arteries. Mediastinum/Nodes: Normal appearance of the thyroid gland. The trachea appears patent and is midline. No enlarged axillary, supraclavicular, mediastinal or hilar adenopathy.  Lungs/Pleura: Small bilateral pleural effusions identified. Subsegmental atelectasis is noted in the lingula and both lower lobes. No airspace consolidation or pneumothorax identified. No suspicious pulmonary nodules. Upper Abdomen: No acute abnormality identified. Musculoskeletal: Mixed lytic and sclerotic bone metastasis is identified involving the T2 vertebra, image 108/9. T9 and T10 metastasis are also identified. Sclerotic lesion within the left seventh rib is identified, image number 94/5. IMPRESSION: 1. No suspicious pulmonary nodules identified 2. Small bilateral pleural effusions. 3. Mixed lytic and sclerotic bone metastasis involving the T2 vertebra, T9 and T10 vertebra. See report from MRI dated 02/27/2018. 4. Sclerotic lesion within the left seventh rib is identified. Suspicious for metastasis. 5. Multi vessel coronary artery atherosclerotic calcifications. Aortic Atherosclerosis (ICD10-I70.0). Electronically Signed   By: TKerby MoorsM.D.   On: 02/27/2018 23:10   Mr Thoracic Spine W Wo Contrast  Result Date: 02/27/2018 CLINICAL DATA:  He does occasionally have nausea and the pain is 10 out of 10 but does not seem to be exacerbated by food. It does seem to be worse at night when he lays down and when he sits up in the morning. EXAM: MRI THORACIC AND LUMBAR SPINE WITHOUT AND WITH CONTRAST TECHNIQUE: Multiplanar and multiecho pulse sequences of the thoracic and lumbar spine were obtained without and with intravenous contrast. CONTRAST:  285mMULTIHANCE GADOBENATE DIMEGLUMINE 529 MG/ML IV SOLN COMPARISON:  None. FINDINGS: MRI THORACIC SPINE FINDINGS Alignment:  Physiologic. Vertebrae: Vertebral body heights are maintained. No acute fracture or static listhesis. 13 mm enhancing bone lesion in the left posterior T2 vertebral body. Heterogeneous marrow edema and T1 hypointensity throughout the T10 vertebral body with heterogeneous enhancement. Small bone lesion in the posterior T5 vertebral body. Small 8  mm bone lesion in the T9 vertebral body. Small Schmorl's node along the superior endplate of T1S17ith surrounding marrow edema without significant enhancement as can be seen with painful Schmorl's node. Cord:  Normal signal and morphology. Paraspinal and other soft tissues: No acute paraspinal abnormality. Disc levels: Disc spaces: Disc spaces are maintained T1-T2: No disc protrusion, foraminal stenosis or central canal stenosis. T2-T3: No disc protrusion, foraminal stenosis or central canal stenosis. T3-T4: No disc protrusion, foraminal stenosis or central canal stenosis. T4-T5: No disc protrusion, foraminal stenosis or central canal stenosis. T5-T6: No disc protrusion,  foraminal stenosis or central canal stenosis. T6-T7: No disc protrusion, foraminal stenosis or central canal stenosis. T7-T8: No disc protrusion, foraminal stenosis or central canal stenosis. T8-T9: No disc protrusion, foraminal stenosis or central canal stenosis. T9-T10: No disc protrusion, foraminal stenosis or central canal stenosis. T10-T11: No disc protrusion, foraminal stenosis or central canal stenosis. T11-T12: No disc protrusion, foraminal stenosis or central canal stenosis. MRI LUMBAR SPINE FINDINGS Segmentation:  Standard. Alignment:  Physiologic. Vertebrae: No acute fracture. No discitis. Small bone lesion in the posterior L3 vertebral body. Small bone lesion in the L1 vertebral body. Small focal bone lesions in the L4 vertebral body. Conus medullaris: Extends to the T12 level and appears normal. Abnormal dural enhancement extending from approximately T7 through T11 most significant at T9 through T11 which may be reactive versus secondary to tumor infiltration. Paraspinal and other soft tissues: No acute paraspinal abnormality. Disc levels: Disc spaces: Degenerative disc disease with disc height loss at L4-5 with reactive endplate changes. T12-L1: No significant disc bulge. No evidence of neural foraminal stenosis. No central canal  stenosis. L1-L2: No significant disc bulge. No evidence of neural foraminal stenosis. No central canal stenosis. L2-L3: No significant disc bulge. No evidence of neural foraminal stenosis. No central canal stenosis. Mild left facet arthropathy. L3-L4: Broad-based disc bulge. Mild bilateral facet arthropathy. Moderate spinal stenosis. No evidence of neural foraminal stenosis. L4-L5: Broad-based disc bulge eccentric towards the left. Mild bilateral facet arthropathy with a small left facet effusion. Severe left foraminal stenosis. Mild right foraminal stenosis. L5-S1: Mild broad-based disc bulge. Mild bilateral facet arthropathy. Mild bilateral foraminal stenosis. No evidence of neural foraminal stenosis. No central canal stenosis. IMPRESSION: THORACIC SPINE 1. Abnormal bone lesions in the T2, T5, T9 and T10 vertebral bodies most concerning for metastatic disease versus multiple myeloma. Abnormal dural enhancement extending from approximately T7 through T11 most significant at T9 through T11 which may be reactive versus secondary to tumor infiltration. 2. Small Schmorl's node along the superior endplate of D32 with surrounding marrow edema without significant enhancement as can be seen with painful Schmorl's node. LUMBAR SPINE 1. Abnormal bone lesions involving the L1, L3 and L4 vertebral bodies most concerning for metastatic disease versus multiple myeloma. 2. At L5-S1 there is a mild broad-based disc bulge. Mild bilateral facet arthropathy. Mild bilateral foraminal stenosis. Electronically Signed   By: Kathreen Devoid   On: 02/27/2018 11:40   Mr Lumbar Spine W Wo Contrast  Result Date: 02/27/2018 CLINICAL DATA:  He does occasionally have nausea and the pain is 10 out of 10 but does not seem to be exacerbated by food. It does seem to be worse at night when he lays down and when he sits up in the morning. EXAM: MRI THORACIC AND LUMBAR SPINE WITHOUT AND WITH CONTRAST TECHNIQUE: Multiplanar and multiecho pulse  sequences of the thoracic and lumbar spine were obtained without and with intravenous contrast. CONTRAST:  50m MULTIHANCE GADOBENATE DIMEGLUMINE 529 MG/ML IV SOLN COMPARISON:  None. FINDINGS: MRI THORACIC SPINE FINDINGS Alignment:  Physiologic. Vertebrae: Vertebral body heights are maintained. No acute fracture or static listhesis. 13 mm enhancing bone lesion in the left posterior T2 vertebral body. Heterogeneous marrow edema and T1 hypointensity throughout the T10 vertebral body with heterogeneous enhancement. Small bone lesion in the posterior T5 vertebral body. Small 8 mm bone lesion in the T9 vertebral body. Small Schmorl's node along the superior endplate of TK02with surrounding marrow edema without significant enhancement as can be seen with painful Schmorl's node. Cord:  Normal signal and morphology. Paraspinal and other soft tissues: No acute paraspinal abnormality. Disc levels: Disc spaces: Disc spaces are maintained T1-T2: No disc protrusion, foraminal stenosis or central canal stenosis. T2-T3: No disc protrusion, foraminal stenosis or central canal stenosis. T3-T4: No disc protrusion, foraminal stenosis or central canal stenosis. T4-T5: No disc protrusion, foraminal stenosis or central canal stenosis. T5-T6: No disc protrusion, foraminal stenosis or central canal stenosis. T6-T7: No disc protrusion, foraminal stenosis or central canal stenosis. T7-T8: No disc protrusion, foraminal stenosis or central canal stenosis. T8-T9: No disc protrusion, foraminal stenosis or central canal stenosis. T9-T10: No disc protrusion, foraminal stenosis or central canal stenosis. T10-T11: No disc protrusion, foraminal stenosis or central canal stenosis. T11-T12: No disc protrusion, foraminal stenosis or central canal stenosis. MRI LUMBAR SPINE FINDINGS Segmentation:  Standard. Alignment:  Physiologic. Vertebrae: No acute fracture. No discitis. Small bone lesion in the posterior L3 vertebral body. Small bone lesion in the L1  vertebral body. Small focal bone lesions in the L4 vertebral body. Conus medullaris: Extends to the T12 level and appears normal. Abnormal dural enhancement extending from approximately T7 through T11 most significant at T9 through T11 which may be reactive versus secondary to tumor infiltration. Paraspinal and other soft tissues: No acute paraspinal abnormality. Disc levels: Disc spaces: Degenerative disc disease with disc height loss at L4-5 with reactive endplate changes. T12-L1: No significant disc bulge. No evidence of neural foraminal stenosis. No central canal stenosis. L1-L2: No significant disc bulge. No evidence of neural foraminal stenosis. No central canal stenosis. L2-L3: No significant disc bulge. No evidence of neural foraminal stenosis. No central canal stenosis. Mild left facet arthropathy. L3-L4: Broad-based disc bulge. Mild bilateral facet arthropathy. Moderate spinal stenosis. No evidence of neural foraminal stenosis. L4-L5: Broad-based disc bulge eccentric towards the left. Mild bilateral facet arthropathy with a small left facet effusion. Severe left foraminal stenosis. Mild right foraminal stenosis. L5-S1: Mild broad-based disc bulge. Mild bilateral facet arthropathy. Mild bilateral foraminal stenosis. No evidence of neural foraminal stenosis. No central canal stenosis. IMPRESSION: THORACIC SPINE 1. Abnormal bone lesions in the T2, T5, T9 and T10 vertebral bodies most concerning for metastatic disease versus multiple myeloma. Abnormal dural enhancement extending from approximately T7 through T11 most significant at T9 through T11 which may be reactive versus secondary to tumor infiltration. 2. Small Schmorl's node along the superior endplate of N27 with surrounding marrow edema without significant enhancement as can be seen with painful Schmorl's node. LUMBAR SPINE 1. Abnormal bone lesions involving the L1, L3 and L4 vertebral bodies most concerning for metastatic disease versus multiple  myeloma. 2. At L5-S1 there is a mild broad-based disc bulge. Mild bilateral facet arthropathy. Mild bilateral foraminal stenosis. Electronically Signed   By: Kathreen Devoid   On: 02/27/2018 11:40   Nm Hepatobiliary Liver Func  Result Date: 02/27/2018 CLINICAL DATA:  Cholelithiasis.  Right flank pain. EXAM: NUCLEAR MEDICINE HEPATOBILIARY IMAGING TECHNIQUE: Sequential images of the abdomen were obtained out to 60 minutes following intravenous administration of radiopharmaceutical. RADIOPHARMACEUTICALS:  5.28 mCi Tc-67m Choletec IV COMPARISON:  None. FINDINGS: Prompt uptake and biliary excretion of activity by the liver is seen. Gallbladder activity is visualized, consistent with patency of cystic duct. Biliary activity passes into small bowel, consistent with patent common bile duct. IMPRESSION: The gallbladder fills normally on today's study. No abnormalities identified. Electronically Signed   By: DDorise BullionIII M.D   On: 02/27/2018 17:37   Ct Abdomen Pelvis W Contrast  Result Date: 02/26/2018 CLINICAL  DATA:  Right upper quadrant abdomen pain for 1 week. EXAM: CT ABDOMEN AND PELVIS WITH CONTRAST TECHNIQUE: Multidetector CT imaging of the abdomen and pelvis was performed using the standard protocol following bolus administration of intravenous contrast. CONTRAST:  117m OMNIPAQUE IOHEXOL 300 MG/ML  SOLN COMPARISON:  March 17, 2013 FINDINGS: Lower chest: Mild dependent atelectasis of posterior lung bases are noted. The heart size is normal. Hepatobiliary: There is diffuse low density of the liver. No focal liver lesions identified. Gallstones identified in the gallbladder. There is no inflammatory change around gallbladder. The biliary tree is normal. Pancreas: Unremarkable. No pancreatic ductal dilatation or surrounding inflammatory changes. Spleen: Normal in size without focal abnormality. Adrenals/Urinary Tract: The adrenal glands are normal. There are multiple cysts in bilateral kidneys. No  hydronephrosis is identified bilaterally. The bladder is normal. Stomach/Bowel: Stomach is within normal limits. Appendix appears normal. No evidence of bowel wall thickening, distention, or inflammatory changes. Vascular/Lymphatic: Aortic atherosclerosis. No enlarged abdominal or pelvic lymph nodes. Reproductive: Prostate is unremarkable. Other: No abdominal wall hernia or abnormality. No abdominopelvic ascites. Musculoskeletal: There is 2.7 x 2.8 cm sclerotic lesion in the T10 vertebral body, new since the prior exam. Degenerative joint changes of the spine are noted. IMPRESSION: No acute abnormality identified in the abdomen and pelvis. 2.8 cm focal sclerotic lesion in the T10 vertebral body, new compared to prior CT of March 17, 2013. Clinical correlation regarding history of primary neoplasm is recommended as metastatic lesion is a differential possibility. Mild fatty infiltration of liver. Cholelithiasis without evidence of cholecystitis on CT. Cyst in both kidneys. Electronically Signed   By: WAbelardo DieselM.D.   On: 02/26/2018 16:53   UKoreaAbdomen Limited Ruq  Result Date: 02/26/2018 CLINICAL DATA:  Right upper quadrant abdomen pain for 1 week. EXAM: ULTRASOUND ABDOMEN LIMITED RIGHT UPPER QUADRANT COMPARISON:  None. FINDINGS: Gallbladder: No wall thickening visualized. Gallstone is noted. The ultrasonographer reports mild sonographic Murphy sign. Common bile duct: Diameter: 4.7 mm Liver: No focal lesion identified. Within normal limits in parenchymal echogenicity. Portal vein is patent on color Doppler imaging with normal direction of blood flow towards the liver. IMPRESSION: Gallstone identified in the gallbladder. The ultrasonographer reports mild sonographic Murphy sign. The findings are equivocal for acute cholecystitis. Clinical correlation is recommended. Electronically Signed   By: WAbelardo DieselM.D.   On: 02/26/2018 19:09    Discharge Instructions:  Mr. WShouse  Please follow up with your  PCP on Thursday 8/15 at 145 pm and follow up with Dr. SAlen Blewat the cancer center on 8/23 at 11 am.   [facility] [facility] Eli[facility]. Your dosing for Eliquis will be '5mg'$  twice daily for 7 days, then 2.'5mg'$  twice daily from then on.   If the pain medications cause you to feel overly drowsy, please STOP taking them and call your PCP.  Hold your metformin for 48 hours. Restart this medication on 03/04/18  Discharge Instructions    [facility]   Complete by:  As directed    Patient is a 62year old male with history of prostate cancer,HIV, tobacco use disorder, polysubstance abuse,DM II, hypertension, COPD, pulmonary embolism, anemia,MDD,chronic low back pain,chronic kidney disease II andhyperlipidemia. Patient is on inhalers for COPD and states managing it well with no pressing issues. COPD action plan discussed with patient. He is on Metformin 500 mg daily at home  for DM. Previously on lisinopril 5 mg, discontinued on 12/09/17 due to lightheadedness worse upon standing from a seated position. Patient reports managing his chronic conditions by following-up with providers, walking 2- 3 times a week (exercise), taking medications as ordered, monitoring blood pressure once a week; blood sugar check occasionally after provider-told him that daily blood sugar check is not necessary (recent A1c of 6.6).  He admits needing to gain further knowledge and information regarding proper management of blood pressure and blood sugars especially with diet (foods to eat and foods to avoid) since he eats what his wife cooks without paying much attention with restrictions. Patient verbalized that both wife and him need further education and reinforcement on diet restrictions for his health conditions.   Patient verbalized that he would prefer phone calls to get information for  now. Patienthadverbally agreed and [facility] regarding proper management of blood pressure and blood sugar (particularly diet restrictions and reinforcement)after he gets back home.    Reason for consult:  [facility] and information regarding proper management of blood pressure and blood sugars through diet restrictions and reinforcement and for further education on disease management of health conditions(mainly DM/ HTN)   Diagnoses of:   Diabetes Other     Other Diagnosis:  HTN   Expected date of contact:  1-3 days (reserved for hospital discharges)      Signed: Mike Craze, DO 03/01/2018, 6:23 PM   Pager: [phone]

## 2018-03-02 ENCOUNTER — Telehealth: Payer: Self-pay | Admitting: Internal Medicine

## 2018-03-02 ENCOUNTER — Other Ambulatory Visit: Payer: Self-pay | Admitting: *Deleted

## 2018-03-02 ENCOUNTER — Encounter: Payer: Self-pay | Admitting: Internal Medicine

## 2018-03-02 DIAGNOSIS — R296 Repeated falls: Secondary | ICD-10-CM | POA: Diagnosis not present

## 2018-03-02 DIAGNOSIS — J449 Chronic obstructive pulmonary disease, unspecified: Secondary | ICD-10-CM | POA: Diagnosis not present

## 2018-03-02 NOTE — Progress Notes (Signed)
   CC: Prostate Cancer with metastases, HTN, Diabetes, DVT, Chronic anticoagualtion  HPI:  Jacob Joseph is a 62 y.o. M with PMHx listed below presenting for Prostate with metastases, HTN, Diabetes, DVT, Chronic anticoagualtion. Please see the A&P for the status of the patient's chronic medical problems.   Prostate Cancer with metastases Patient recently discharged from the hospital. He was admitted from 8/10-8/13. Metastatic lesions were noted incidentally on CT. He had a PSA 7.0 that admission (was 23.1, 1 yr prior). A bone scan was done which showed "new areas of abnormal radiotracer localization to bone... ....involving the thoracic spine, left posterior sixth rib and superior right iliac crest, consistent with metastatic disease to bone. Another focal area of uptake projecting along the inferior, medial left orbit is more nonspecific and could be reactive or metastatic uptake."  Follow up with Oncology, Dr. Alen Blew has been arranged for 8/23 @ 11am. He was prescribed Oxycodone and Gabapentin for worsening right flank pain. He has been taking his pain medications with good effect and pain remains tolerable. He has not, however, picked up his Miralax and has not had a bowl movement since his admission. He was provided with this prescription upon discharge and reminded today of the importance of picking it up. He denies drowsiness or balance issues since starting the pain medication  Past Medical History:  Diagnosis Date  . AKI (acute kidney injury) ([place]) 09/24/2016  . Calcium oxalate crystals in urine 12/23/2011   Asymptomatic, no hematuria. Advised to take plenty of water.  [name] Kitchen COPD (chronic obstructive pulmonary disease) ([place])   . Depression   . Diabetes mellitus without complication ([place]) 5027   pre diabetic  . Hepatitis C   . HIV (human immunodeficiency virus infection) ([place])   . Hypertension   . Peripheral arterial disease ([place])    ooccluded left SFA by duplex ultrasound, tibial disease  left greater than right  . Prostate cancer metastatic to multiple sites [facility])   . Stroke ([place]) 11/2011   Carotids Doppler negative. Right sided weakness resolved, initially on Plavix for 3 months and then continued with ASA.    . TB lung, latent 1988   Treated   Review of Systems: Performed and all others negative.     Physical Exam:  Vitals:   03/03/18 1406  BP: 113/64  Pulse: (!) 122  Temp: 100.1 F (37.8 C)  TempSrc: Oral  SpO2: 98%  Weight: 221 lb 8 oz (100.5 kg)  Height: 6' (1.829 m)   Physical Exam  Constitutional: He appears well-developed and well-nourished. No distress.  Cardiovascular: Regular rhythm, normal heart sounds and intact distal pulses.  Tachycardic  Pulmonary/Chest: Breath sounds normal. No respiratory distress.  Mildly increased respiratory effort, patient states he is at his baseline  Abdominal: Soft. Bowel sounds are normal. He exhibits no distension. There is no tenderness.  Musculoskeletal: He exhibits no deformity.  Trace pitting RLE edema  Skin: Skin is warm and dry.     Assessment & Plan:   See Encounters Tab for problem based charting.  Patient discussed with Dr. Dareen Piano

## 2018-03-02 NOTE — Telephone Encounter (Signed)
Xarelto not refilled as this was recently discontinued and he was transitioned to Eliquis. Voltaren Gel has been refilled.  [name]

## 2018-03-02 NOTE — Assessment & Plan Note (Addendum)
Patient recently discharged from the hospital. He was admitted from 8/10-8/13. Metastatic lesions were noted incidentally on CT. He had a PSA 7.0 that admission (was 23.1, 1 yr prior). A bone scan was done which showed "new areas of abnormal radiotracer localization to bone... ....involving the thoracic spine, left posterior sixth rib and superior right iliac crest, consistent with metastatic disease to bone. Another focal area of uptake projecting along the inferior, medial left orbit is more nonspecific and could be reactive or metastatic uptake."  Follow up with Oncology, Dr. Alen Blew has been arranged for 8/23 @ 11am. He was prescribed Oxycodone and Gabapentin for worsening right flank pain. He has been taking his pain medications with good effect and pain remains tolerable. He has not, however, picked up his Miralax and has not had a bowl movement since his admission. He was provided with this prescription upon discharge and reminded today of the importance of picking it up. He denies drowsiness or balance issues since starting the pain medication - Continue Flomax 0.4 mg - Follow up with oncology - Oxycodone 10mg  q6h PRN and Gabapentin 600mg  BID for pain - Miralax for bowl regimen

## 2018-03-02 NOTE — Telephone Encounter (Signed)
Next appt scheduled tomorrow with PCP.

## 2018-03-02 NOTE — Telephone Encounter (Signed)
[facility] would like to inform that pt was discharge hospital and he is not eligible for [facility] services. Edwena Felty [phone]

## 2018-03-03 ENCOUNTER — Ambulatory Visit (INDEPENDENT_AMBULATORY_CARE_PROVIDER_SITE_OTHER): Payer: Medicare HMO | Admitting: Internal Medicine

## 2018-03-03 ENCOUNTER — Other Ambulatory Visit: Payer: Self-pay

## 2018-03-03 ENCOUNTER — Encounter: Payer: Self-pay | Admitting: Internal Medicine

## 2018-03-03 VITALS — BP 113/64 | HR 122 | Temp 100.1°F | Ht 72.0 in | Wt 221.5 lb

## 2018-03-03 DIAGNOSIS — K5900 Constipation, unspecified: Secondary | ICD-10-CM | POA: Diagnosis not present

## 2018-03-03 DIAGNOSIS — Z7901 Long term (current) use of anticoagulants: Secondary | ICD-10-CM

## 2018-03-03 DIAGNOSIS — Z8679 Personal history of other diseases of the circulatory system: Secondary | ICD-10-CM

## 2018-03-03 DIAGNOSIS — C61 Malignant neoplasm of prostate: Secondary | ICD-10-CM | POA: Diagnosis not present

## 2018-03-03 DIAGNOSIS — Z86711 Personal history of pulmonary embolism: Secondary | ICD-10-CM | POA: Diagnosis not present

## 2018-03-03 DIAGNOSIS — Z7984 Long term (current) use of oral hypoglycemic drugs: Secondary | ICD-10-CM | POA: Diagnosis not present

## 2018-03-03 DIAGNOSIS — I10 Essential (primary) hypertension: Secondary | ICD-10-CM

## 2018-03-03 DIAGNOSIS — Z79891 Long term (current) use of opiate analgesic: Secondary | ICD-10-CM

## 2018-03-03 DIAGNOSIS — R296 Repeated falls: Secondary | ICD-10-CM | POA: Diagnosis not present

## 2018-03-03 DIAGNOSIS — C7951 Secondary malignant neoplasm of bone: Secondary | ICD-10-CM | POA: Diagnosis not present

## 2018-03-03 DIAGNOSIS — E119 Type 2 diabetes mellitus without complications: Secondary | ICD-10-CM | POA: Diagnosis not present

## 2018-03-03 DIAGNOSIS — I82431 Acute embolism and thrombosis of right popliteal vein: Secondary | ICD-10-CM

## 2018-03-03 DIAGNOSIS — Z79899 Other long term (current) drug therapy: Secondary | ICD-10-CM

## 2018-03-03 DIAGNOSIS — Z86718 Personal history of other venous thrombosis and embolism: Secondary | ICD-10-CM

## 2018-03-03 LAB — POCT GLYCOSYLATED HEMOGLOBIN (HGB A1C): Hemoglobin A1C: 6.1 % — AB (ref 4.0–5.6)

## 2018-03-03 LAB — GLUCOSE, CAPILLARY: Glucose-Capillary: 237 mg/dL — ABNORMAL HIGH (ref 70–99)

## 2018-03-03 NOTE — Assessment & Plan Note (Addendum)
Patient has a history of Hypertension. BP today 131/64. His Lisinopril was discontinued during last visit as his BP was soft and he had experienced some lightheadedness. His blood pressure remains well controlled off medication. - Continue lifestyle modifications

## 2018-03-03 NOTE — Assessment & Plan Note (Addendum)
Patient has a history of DVT and PE. Chonically anticoagulated since second unprovoked event in Oct 2017. Experienced recurrent DVT while admitted on 8/14. He was previously on Edoxaban 60mg  daily, but this was discontinued due to cost and he was transitioned to Xarelto. Xarelto was discontinued due to interaction with his ART (Genvoya/Prezista). 02/2018. Patient transitioned to Eliquis 5mg  PO BID for 15 dose (due to acute DVT) to be followed by dose of  2.5mg  PO BID thereafter. Patient has been taking his Eliquis 5mg  BID since discharge. He is to start 2.5mg  BID on 03/09/18. He denies any significant bleeding events.  He states that his RLE continues to feel swollen and trace pitting edema was noted on exam. He was informed that it would take time for the clot to resorb and he may experience some swelling. He was also tachycardic on exam with HR 112, this is consistent with his rate while admitted of 80s-114. There is a chance of small, subclinical PE given patient's history; however, he does not have any significant shortness of breath or other symptoms and his is on the correct therapy for DVT/PE. Patient also states he had tea and a cup of coffee before coming to his visit, which could be contributing. Patient given return precautions for if he experienced persistent tachycardia and/or shortness of breath. - Continue Eliquis 5mg  PO BID for total of 15 dose (due to acute DVT) to be followed by dose of  2.5mg  PO BID starting 03/09/18

## 2018-03-03 NOTE — Patient Instructions (Addendum)
Thank you for allowing [place] to care for you  Please follow up with Oncology (Dr. Alen Blew) as scheduled on 8/23 at 11am  Please pick up Miralax to help with your constipation  Continue to take Eliquis as prescribed for your blood clot and to prevent future blood clots (5mg  twice a day for these first 2 weeks0 then 2.5mg  twice a day going forward - If you have persistently high hear rate and persistent shortness of breath please contact us or emergency medical care.  For you diabetes: A1c is improved today at 6.1, good job! Continue current medications  Blood pressure remains well controlled today  Please follow up in 3 month or earlier if needed

## 2018-03-03 NOTE — Assessment & Plan Note (Addendum)
Hgb A1c today 6.1; This is improved from previous A1c of 6.1. Will maintain current regimen. Atempted to obtain Urine Microalbumin, but patient was unable to provide sample today. - Metformin 500mg  Daily - Foot exam today - Eye exam needed, patient states he will set appointment with his eye doctor

## 2018-03-04 DIAGNOSIS — R296 Repeated falls: Secondary | ICD-10-CM | POA: Diagnosis not present

## 2018-03-07 DIAGNOSIS — R296 Repeated falls: Secondary | ICD-10-CM | POA: Diagnosis not present

## 2018-03-07 NOTE — Progress Notes (Signed)
Internal Medicine Clinic Attending  Case discussed with Dr. Melvin  at the time of the visit.  We reviewed the resident's history and exam and pertinent patient test results.  I agree with the assessment, diagnosis, and plan of care documented in the resident's note.  

## 2018-03-07 NOTE — ED Provider Notes (Signed)
[facility]  SURGICAL Provider Note   CSN: [id number] Arrival date & time: 02/26/18  1120     History   Chief Complaint Chief Complaint  Patient presents with  . Abdominal Pain    HPI Jacob Joseph is a 62 y.o. male.  HPI Patient presents to the emergency department with right upper quadrant abdominal discomfort that started 1 week ago.  Patient states that nothing seems to make the condition better or worse.  He states that he did take gabapentin with no relief of his symptoms.  Patient states that he has not had any similar issues in the past.  The patient denies chest pain, shortness of breath, headache,blurred vision, neck pain, fever, cough, weakness, numbness, dizziness, anorexia, edema,  nausea, vomiting, diarrhea, rash, back pain, dysuria, hematemesis, bloody stool, near syncope, or syncope. Past Medical History:  Diagnosis Date  . AKI (acute kidney injury) ([place]) 09/24/2016  . Calcium oxalate crystals in urine 12/23/2011   Asymptomatic, no hematuria. Advised to take plenty of water.  [name] Kitchen COPD (chronic obstructive pulmonary disease) ([place])   . Depression   . Diabetes mellitus without complication ([place]) 8756   pre diabetic  . Hepatitis C   . HIV (human immunodeficiency virus infection) ([place])   . Hypertension   . Peripheral arterial disease ([place])    ooccluded left SFA by duplex ultrasound, tibial disease left greater than right  . Prostate cancer metastatic to multiple sites [facility])   . Stroke ([place]) 11/2011   Carotids Doppler negative. Right sided weakness resolved, initially on Plavix for 3 months and then continued with ASA.    . TB lung, latent 1988   Treated    Patient Active Problem List   Diagnosis Date Noted  . Acute deep vein thrombosis (DVT) of right popliteal vein ([place])   . Prostate cancer metastatic to multiple sites [facility])   . Radicular pain of thoracic region   . Bone lesion   . Calculus of gallbladder without cholecystitis without  obstruction   . Chronic anticoagulation   . Right upper quadrant pain 02/26/2018  . Abnormal computed tomography of thoracic spine 02/26/2018  . Recurrent falls 11/02/2016  . Hx of pulmonary embolus 09/22/2016  . History of prostate cancer 09/22/2016  . BPH associated with nocturia 06/19/2016  . PE (pulmonary thromboembolism) ([place]) 05/12/2016  . Substance abuse ([place]) 01/27/2016  . Vitamin D deficiency 12/05/2015  . Hyperlipidemia 01/28/2015  . Cataracts, bilateral 08/29/2014  . Diabetes mellitus treated with oral medication ([place]) 10/19/2013  . COPD, moderate ([place]) 05/31/2013  . Complex Lt Renal Cyst 03/22/2013  . Preventive measure 05/30/2012  . History of CVA 03/08/2012  . Normocytic anemia 03/08/2012  . CKD (chronic kidney disease) stage 3, GFR 30-59 ml/min (H[facility]) 03/08/2012  . ERECTILE DYSFUNCTION 04/25/2008  . HIV disease ([place]) 04/27/2006  . Smoking greater than 30 pack years 04/27/2006  . Major depressive disorder, recurrent episode ([place]) 04/27/2006  . History of hypertension 04/27/2006  . DEGENERATIVE JOINT DISEASE 04/27/2006  . Chronic Low Back Pain 04/27/2006    Past Surgical History:  Procedure Laterality Date  . HERNIA REPAIR    . INGUINAL HERNIA REPAIR  01/16/2012   Procedure: HERNIA REPAIR INGUINAL INCARCERATED;  Surgeon: Zenovia Jarred, MD;  Location: [place];  Service: General;  Laterality: Right;  . TEE WITHOUT CARDIOVERSION  12/07/2011   Procedure: TRANSESOPHAGEAL ECHOCARDIOGRAM (TEE);  Surgeon: Birdie Riddle, MD;  Location: [facility];  Service: Cardiovascular;  Laterality: N/A;  Home Medications    Prior to Admission medications   Medication Sig Start Date End Date Taking? Authorizing Provider  albuterol (PROAIR HFA) 108 (90 Base) MCG/ACT inhaler INHALE 2 PUFFS INTO THE LUNGS EVERY 6 HOURS AS NEEDED FOR WHEEZING OR SHORTNESS OF BREATH, TO NOT USE EVERY DAY, USE ONLY IF NEEDED 01/07/17  Yes Axel Filler, MD  aspirin 81 MG EC tablet Take 1  tablet (81 mg total) by mouth daily. 01/27/16  Yes Burgess Estelle, MD  azelastine (ASTELIN) 0.1 % nasal spray Place 1 spray into both nostrils 2 (two) times daily. Use in each nostril as directed Patient taking differently: Place 1 spray into both nostrils 2 (two) times daily as needed for rhinitis.  06/16/17  Yes Burgess Estelle, MD  Budesonide 90 MCG/ACT inhaler Inhale 1-2 puffs into the lungs 2 (two) times daily. 09/30/16  Yes Oval Linsey, MD  cholecalciferol (VITAMIN D) 1000 units tablet Take 1 tablet (1,000 Units total) by mouth daily. 01/27/16  Yes Burgess Estelle, MD  citalopram (CELEXA) 20 MG tablet Take 20 mg by mouth daily. 10/25/14  Yes [provider]  GENVOYA 150-150-200-10 MG TABS tablet TAKE 1 TABLET BY MOUTH DAILY WITH BREAKFAST Patient taking differently: Take 1 tablet by mouth daily.  01/25/18  Yes Michel Bickers, MD  hydroxypropyl methylcellulose / hypromellose (ISOPTO TEARS / GONIOVISC) 2.5 % ophthalmic solution Place 1 drop into both eyes as needed for dry eyes.   Yes [provider]  hydrOXYzine (VISTARIL) 50 MG capsule Take 50 mg by mouth at bedtime.  01/21/18  Yes [provider]  pravastatin (PRAVACHOL) 40 MG tablet Take 1 tablet (40 mg total) by mouth daily. 01/12/18  Yes Neva Seat, MD  PREZISTA 800 MG tablet TAKE 1 TABLET BY MOUTH EVERY DAY WITH BREAKFAST Patient taking differently: Take 800 mg by mouth daily.  01/25/18  Yes Michel Bickers, MD  QUEtiapine (SEROQUEL) 300 MG tablet Take 300 mg by mouth 2 (two) times daily.  11/11/15  Yes [provider]  tamsulosin (FLOMAX) 0.4 MG CAPS capsule TAKE 1 CAPSULE(0.4 MG) BY MOUTH DAILY AFTER BREAKFAST Patient taking differently: Take 0.4 mg by mouth daily. TAKE 1 CAPSULE(0.4 MG) BY MOUTH DAILY AFTER BREAKFAST 01/25/18  Yes Neva Seat, MD  Tiotropium Bromide-Olodaterol (STIOLTO RESPIMAT) 2.5-2.5 MCG/ACT AERS Inhale 2 puffs into the lungs daily. 12/27/17  Yes Neva Seat, MD  valACYclovir  (VALTREX) 500 MG tablet TAKE 1 TABLET BY MOUTH DAILY Patient taking differently: Take 500 mg by mouth daily.  12/15/17  Yes Michel Bickers, MD  apixaban (ELIQUIS) 2.5 MG TABS tablet Take 1 tablet (2.5 mg total) by mouth 2 (two) times daily. Starting 8/21 03/09/18   Molt, Bethany, DO  apixaban (ELIQUIS) 5 MG TABS tablet Take 1 tablet (5 mg total) by mouth 2 (two) times daily for 7 days. THEN 03/01/18 03/08/18  Molt, Bethany, DO  Blood Glucose Monitoring Suppl (ONETOUCH VERIO) w/Device KIT 1 Device by Does not apply route 2 (two) times daily. ICD 10  E11.8, non-insulin dependent, test up to twice daily 09/30/16   Burgess Estelle, MD  diclofenac sodium (VOLTAREN) 1 % GEL APPLY 4 GRAMS EXTERNALLY TO THE AFFECTED AREA FOUR TIMES DAILY AS NEEDED FOR PAIN 03/02/18   Neva Seat, MD  gabapentin (NEURONTIN) 300 MG capsule Take 2 capsules (600 mg total) by mouth 2 (two) times daily. 03/01/18   Molt, Bethany, DO  glucose blood (ONETOUCH VERIO) test strip Use as instructed ICD 10  E11.8, non-insulin dependent, test up to twice  daily 09/30/16   Burgess Estelle, MD  metFORMIN (GLUCOPHAGE) 500 MG tablet Take 1 tablet (500 mg total) by mouth daily with breakfast. 03/04/18   Molt, Bethany, DO  ONETOUCH DELICA LANCETS FINE MISC 1 Stick by Does not apply route 2 (two) times daily. ICD 10  E11.8, non-insulin dependent, test up to twice daily 09/30/16   Burgess Estelle, MD  oxyCODONE 10 MG TABS Take 1 tablet (10 mg total) by mouth every 6 (six) hours as needed for severe pain or breakthrough pain. 03/01/18   Molt, Bethany, DO  polyethylene glycol (MIRALAX / GLYCOLAX) packet Take 17 g by mouth daily. 03/02/18   Molt, Bethany, DO    Family History Family History  Problem Relation Age of Onset  . Hypertension Mother   . Heart attack Mother   . Stroke Mother   . Hypertension Father   . Cancer Father     Social History Social History   Tobacco Use  . Smoking status: Current Every Day Smoker    Packs/day: 0.50    Types:  Cigarettes    Last attempt to quit: 11/18/2014    Years since quitting: 3.3  . Smokeless tobacco: Never Used  . Tobacco comment: .5 PPD  Substance Use Topics  . Alcohol use: No    Alcohol/week: 0.0 standard drinks  . Drug use: Yes    Frequency: 3.0 times per week    Types: "Crack" cocaine, Cocaine, Marijuana    Comment: pt reports last use last night 02-26-18     Allergies   Statins   Review of Systems Review of Systems All other systems negative except as documented in the HPI. All pertinent positives and negatives as reviewed in the HPI. Physical Exam Updated Vital Signs BP (!) 142/87 (BP Location: Left Arm)   Pulse (!) 114   Temp 98 F (36.7 C) (Oral)   Resp 17   Ht 6' (1.829 m)   Wt 97.5 kg   SpO2 100%   BMI 29.16 kg/m   Physical Exam  Constitutional: He is oriented to person, place, and time. He appears well-developed and well-nourished. No distress.  HENT:  Head: Normocephalic and atraumatic.  Mouth/Throat: Oropharynx is clear and moist.  Eyes: Pupils are equal, round, and reactive to light.  Neck: Normal range of motion. Neck supple.  Cardiovascular: Normal rate, regular rhythm and normal heart sounds. Exam reveals no gallop and no friction rub.  No murmur heard. Pulmonary/Chest: Effort normal and breath sounds normal. No respiratory distress. He has no wheezes.  Abdominal: Soft. Bowel sounds are normal. He exhibits no distension. There is tenderness in the right upper quadrant. There is no rigidity, no rebound, no guarding and no CVA tenderness. No hernia.  Neurological: He is alert and oriented to person, place, and time. He exhibits normal muscle tone. Coordination normal.  Skin: Skin is warm and dry. Capillary refill takes less than 2 seconds. No rash noted. No erythema.  Psychiatric: He has a normal mood and affect. His behavior is normal.  Nursing note and vitals reviewed.    ED Treatments / Results  Labs (all labs ordered are listed, but only abnormal  results are displayed) Labs Reviewed  LIPASE, BLOOD - Abnormal; Notable for the following components:      Result Value   Lipase 126 (*)    All other components within normal limits  COMPREHENSIVE METABOLIC PANEL - Abnormal; Notable for the following components:   CO2 19 (*)    Glucose, Bld 125 (*)  Creatinine, Ser 1.52 (*)    Albumin 3.1 (*)    GFR calc non Af Amer 48 (*)    GFR calc Af Amer 56 (*)    All other components within normal limits  CBC - Abnormal; Notable for the following components:   RBC 3.81 (*)    Hemoglobin 10.3 (*)    HCT 33.0 (*)    RDW 15.8 (*)    All other components within normal limits  URINALYSIS, ROUTINE W REFLEX MICROSCOPIC - Abnormal; Notable for the following components:   Color, Urine STRAW (*)    Specific Gravity, Urine 1.042 (*)    Hgb urine dipstick LARGE (*)    All other components within normal limits  PSA, SERUM (SERIAL MONITOR) - Abnormal; Notable for the following components:   Prostate Specific Ag, Serum 7.0 (*)    All other components within normal limits  MULTIPLE MYELOMA PANEL, SERUM - Abnormal; Notable for the following components:   IgG (Immunoglobin G), Serum 1,914 (*)    Alpha2 Glob SerPl Elph-Mc[place] 1.3 (*)    Gamma Glob SerPl Elph-Mcnc 1.9 (*)    Globulin, Total 4.6 (*)    All other components within normal limits  RAPID URINE DRUG SCREEN, HOSP PERFORMED - Abnormal; Notable for the following components:   Cocaine POSITIVE (*)    All other components within normal limits  GLUCOSE, CAPILLARY - Abnormal; Notable for the following components:   Glucose-Capillary 113 (*)    All other components within normal limits  IRON AND TIBC - Abnormal; Notable for the following components:   Iron 26 (*)    Saturation Ratios 9 (*)    All other components within normal limits  TESTOSTERONE,FREE AND TOTAL - Abnormal; Notable for the following components:   Testosterone 16 (*)    Testosterone, Free 2.4 (*)    All other components within  normal limits  COMPREHENSIVE METABOLIC PANEL - Abnormal; Notable for the following components:   Glucose, Bld 112 (*)    Creatinine, Ser 1.35 (*)    Calcium 8.8 (*)    Albumin 2.7 (*)    AST 13 (*)    GFR calc non Af Amer 56 (*)    All other components within normal limits  COMPREHENSIVE METABOLIC PANEL - Abnormal; Notable for the following components:   Glucose, Bld 126 (*)    Creatinine, Ser 1.31 (*)    Albumin 2.8 (*)    GFR calc non Af Amer 58 (*)    All other components within normal limits  LACTATE DEHYDROGENASE  PARATHYROID HORMONE, INTACT (NO CA)  CK  FERRITIN    EKG None  Radiology No results found.  Procedures Procedures (including critical care time)  Medications Ordered in ED Medications  HYDROmorphone (DILAUDID) injection 1 mg (1 mg Intravenous Given 02/27/18 0850)  0.9 %  sodium chloride infusion ( Intravenous Stopped 02/27/18 1900)  morphine 4 MG/ML injection 4 mg (4 mg Intravenous Given 02/26/18 1802)  iohexol (OMNIPAQUE) 300 MG/ML solution 100 mL (100 mLs Intravenous Contrast Given 02/26/18 1617)  ondansetron (ZOFRAN) injection 4 mg (4 mg Intravenous Given 02/26/18 1801)  HYDROmorphone (DILAUDID) injection 1 mg (1 mg Intravenous Given 02/26/18 2006)  iohexol (OMNIPAQUE) 300 MG/ML solution 75 mL (75 mLs Intravenous Contrast Given 02/27/18 0236)  gadobenate dimeglumine (MULTIHANCE) injection 20 mL (20 mLs Intravenous Contrast Given 02/27/18 1101)  technetium TC 81M mebrofenin (CHOLETEC) injection 9.32 millicurie (3.55 millicuries Intravenous Contrast Given 02/27/18 1505)  technetium medronate (TC-MDP) injection 20 millicurie (20 millicuries Intravenous  Contrast Given 03/01/18 1324)     Initial Impression / Assessment and Plan / ED Course  I have reviewed the triage vital signs and the nursing notes.  Pertinent labs & imaging results that were available during my care of the patient were reviewed by me and considered in my medical decision making (see chart for  details).     I signed the patient out to Dr. Maryan Rued who will follow-up on the CT scan and provide further evaluation as needed.  Patient has been stable here in the emergency department.  Final Clinical Impressions(s) / ED Diagnoses   Final diagnoses:  RUQ abdominal pain  Calculus of gallbladder with acute cholecystitis without obstruction  Bone lesion    ED Discharge Orders         Ordered    apixaban (ELIQUIS) 2.5 MG TABS tablet  2 times daily     03/01/18 1821    metFORMIN (GLUCOPHAGE) 500 MG tablet  Daily with breakfast     03/01/18 1821    polyethylene glycol (MIRALAX / GLYCOLAX) packet  Daily     03/01/18 1821    AMB Referral to [facility]    Comments:  Patient is a 62 year old male with history of prostate cancer,HIV, tobacco use disorder, polysubstance abuse,DM II, hypertension, COPD, pulmonary embolism, anemia,MDD,chronic low back pain,chronic kidney disease II andhyperlipidemia. Patient is on inhalers for COPD and states managing it well with no pressing issues. COPD action plan discussed with patient. He is on Metformin 500 mg daily at home for DM. Previously on lisinopril 5 mg, discontinued on 12/09/17 due to lightheadedness worse upon standing from a seated position. Patient reports managing his chronic conditions by following-up with providers, walking 2- 3 times a week (exercise), taking medications as ordered, monitoring blood pressure once a week; blood sugar check occasionally after provider-told him that daily blood sugar check is not necessary (recent A1c of 6.6).  He admits needing to gain further knowledge and information regarding proper management of blood pressure and blood sugars especially with diet (foods to eat and foods to avoid) since he eats what his wife cooks without paying much attention with restrictions. Patient verbalized that both wife and him need further education and reinforcement on diet restrictions for his health  conditions.   Patient verbalized that he would prefer phone calls to get information for now. Patienthadverbally agreed and [facility] education regarding proper management of blood pressure and blood sugar (particularly diet restrictions and reinforcement)after he gets back home.    03/01/18 1353    oxyCODONE 10 MG TABS  Every 6 hours PRN     03/01/18 1821    gabapentin (NEURONTIN) 300 MG capsule  2 times daily     03/01/18 1821    apixaban (ELIQUIS) 5 MG TABS tablet  2 times daily     03/01/18 1821    apixaban (ELIQUIS) 2.5 MG TABS tablet  2 times daily     Pending    gabapentin (NEURONTIN) 300 MG capsule  2 times daily     Pending    polyethylene glycol (MIRALAX / GLYCOLAX) packet  Daily     Pending           Dalia Heading, PA-C 03/07/18 2343    Hayden Rasmussen, MD 03/08/18 1147

## 2018-03-08 ENCOUNTER — Telehealth: Payer: Self-pay | Admitting: Internal Medicine

## 2018-03-08 DIAGNOSIS — R32 Unspecified urinary incontinence: Secondary | ICD-10-CM | POA: Diagnosis not present

## 2018-03-08 DIAGNOSIS — R296 Repeated falls: Secondary | ICD-10-CM | POA: Diagnosis not present

## 2018-03-08 DIAGNOSIS — C61 Malignant neoplasm of prostate: Secondary | ICD-10-CM | POA: Diagnosis not present

## 2018-03-08 NOTE — Telephone Encounter (Signed)
Attempted to call patient regarding a letter from [place] who have been unable to reach him regarding his home health. There was no answer at this time. I have instructed [facility] to continue to contact the patient as he will benefit from their services.  [name], DO IM PGY-2

## 2018-03-09 DIAGNOSIS — R296 Repeated falls: Secondary | ICD-10-CM | POA: Diagnosis not present

## 2018-03-10 ENCOUNTER — Other Ambulatory Visit: Payer: Self-pay | Admitting: Internal Medicine

## 2018-03-10 ENCOUNTER — Telehealth: Payer: Self-pay | Admitting: *Deleted

## 2018-03-10 DIAGNOSIS — R296 Repeated falls: Secondary | ICD-10-CM | POA: Diagnosis not present

## 2018-03-10 MED ORDER — GABAPENTIN 300 MG PO CAPS: 600.0000 mg | ORAL_CAPSULE | Freq: Two times a day (BID) | ORAL | 2 refills | Status: DC

## 2018-03-10 NOTE — Telephone Encounter (Signed)
Tried calling patient to remind him of his New Patient appointment with Dr. Alen Blew tomorrow at 11 am. However, his cell phone is not set up to receive voice mails.

## 2018-03-10 NOTE — Telephone Encounter (Signed)
Needs medication for 120 days gabapentin 300mg .

## 2018-03-11 ENCOUNTER — Telehealth: Payer: Self-pay

## 2018-03-11 ENCOUNTER — Telehealth: Payer: Self-pay | Admitting: Pharmacist

## 2018-03-11 ENCOUNTER — Inpatient Hospital Stay: Payer: Medicare HMO | Attending: Oncology | Admitting: Oncology

## 2018-03-11 VITALS — BP 155/93 | HR 95 | Temp 98.7°F | Resp 18 | Ht 72.0 in | Wt 219.5 lb

## 2018-03-11 DIAGNOSIS — Z7982 Long term (current) use of aspirin: Secondary | ICD-10-CM

## 2018-03-11 DIAGNOSIS — Z7901 Long term (current) use of anticoagulants: Secondary | ICD-10-CM

## 2018-03-11 DIAGNOSIS — Z7984 Long term (current) use of oral hypoglycemic drugs: Secondary | ICD-10-CM | POA: Diagnosis not present

## 2018-03-11 DIAGNOSIS — C61 Malignant neoplasm of prostate: Secondary | ICD-10-CM | POA: Diagnosis not present

## 2018-03-11 DIAGNOSIS — C7951 Secondary malignant neoplasm of bone: Secondary | ICD-10-CM | POA: Diagnosis not present

## 2018-03-11 DIAGNOSIS — E119 Type 2 diabetes mellitus without complications: Secondary | ICD-10-CM | POA: Diagnosis not present

## 2018-03-11 DIAGNOSIS — R296 Repeated falls: Secondary | ICD-10-CM | POA: Diagnosis not present

## 2018-03-11 DIAGNOSIS — Z86718 Personal history of other venous thrombosis and embolism: Secondary | ICD-10-CM

## 2018-03-11 DIAGNOSIS — R109 Unspecified abdominal pain: Secondary | ICD-10-CM

## 2018-03-11 DIAGNOSIS — Z86711 Personal history of pulmonary embolism: Secondary | ICD-10-CM

## 2018-03-11 DIAGNOSIS — F17210 Nicotine dependence, cigarettes, uncomplicated: Secondary | ICD-10-CM | POA: Diagnosis not present

## 2018-03-11 DIAGNOSIS — Z79899 Other long term (current) drug therapy: Secondary | ICD-10-CM | POA: Diagnosis not present

## 2018-03-11 MED ORDER — ABIRATERONE ACETATE 250 MG PO TABS: 1000.0000 mg | ORAL_TABLET | Freq: Every day | ORAL | 0 refills | Status: DC

## 2018-03-11 MED ORDER — PREDNISONE 5 MG PO TABS: 5.0000 mg | ORAL_TABLET | Freq: Every day | ORAL | 0 refills | Status: DC

## 2018-03-11 NOTE — Telephone Encounter (Signed)
Oral Oncology Pharmacist Encounter  Received new prescription for Zytiga (abiraterone) for the treatment of metastatic, castration-resistant prostate cancer in conjunction with prednisone and androgen deprivation therapy (lupron injections), planned duration until disease progression or unacceptable toxicity.  Labs from Epic assessed, [place] for treatment. Noted SCr=1.31 on8/13/19, est CrCl ~ 80 mL/min, dose dose adjustment recommended  Current medication list in Epic reviewed, no significant DDIs with Zytiga identified.  Zytiga prescription has been e-scribed to the [facility] for benefits analysis and approval. Prednisone prescription has been e-scribed to [employer].  Oral Oncology Clinic will continue to follow for insurance authorization, copayment issues, initial counseling and start date.  [name], PharmD, BCPS, BCOP  03/11/2018 11:29 AM Oral Oncology Clinic [phone]

## 2018-03-11 NOTE — Telephone Encounter (Signed)
Oral Chemotherapy Pharmacist Encounter   Attempted to reach patient to provide update and offer for initial counseling on oral medication: Zytiga.  No answer. Voicemail is full so unable to leave message. We will continue to attempt to contact Jacob Joseph for medication acquisition and initial counseling.  [name], PharmD, BCPS, BCOP  03/11/2018   12:03 PM Oral Oncology Clinic [phone]

## 2018-03-11 NOTE — Telephone Encounter (Signed)
Printed avs and calender of upcoming appointment. Per 8/23 [place] requested time was added by Lenna Sciara X

## 2018-03-11 NOTE — Telephone Encounter (Signed)
Oral Oncology Patient Advocate Encounter  Received notification from [place] that prior authorization for Fabio Asa is required.  PA submitted on CoverMyMeds Key [id number] Status is pending  Oral Oncology Clinic will continue to follow.  [place] Patient [place] Phone [phone] Fax [phone]

## 2018-03-11 NOTE — Progress Notes (Addendum)
Reason for the request: Prostate cancer  HPI: I was asked by Dr. Laural Golden to evaluate Jacob Joseph for prostate cancer.  He is a pleasant 61 year old man native of the state of [place] but currently resides in this area and has done so for an extended period of time.  He has history of diabetes, HIV as well as chronic renal insufficiency.  He was diagnosed with prostate cancer in 2017 after presenting with an elevated PSA of 23.1 with a potentially locally advanced disease and possibly metastatic disease to the bone at the time.  He was treated by [name] with Lupron he has been receiving every 4 months since that time.  It is unclear what was his PSA nadir at the time but in August 2019 his PSA was up to 7.0.  He was hospitalized the 10th 2019 with right-sided flank pain and his evaluation revealed multiple sclerotic bony lesions evident on a bone scan and MRI.  His obtained on 03/01/2018 showed abnormal uptake in the thoracic spine, ribs, right to iliac crest among others.  MRI of the thoracic and lumbar spine showed abnormal lesions in L1, L3 and L4 vertebral body.  There is a L5 and S1 disc bulge noted.  He was started on oxycodone and Neurontin and was discharged home on Eliquis after new diagnosis of deep vein thrombosis.  Clinically, he reports feeling better since his discharge.  His abdominal/flank pain has improved.  He is ambulating with the help of a cane without any falls or syncope.  His performance status is marginal but has not changed dramatically.  He denies any falls or syncope.  He denies any other bone pain including back pain, rib cage pain or hip pain.  He does not report any headaches, blurry vision, syncope or seizures. Does not report any fevers, chills or sweats.  Does not report any cough, wheezing or hemoptysis.  Does not report any chest pain, palpitation, orthopnea or leg edema.  Does not report any nausea, vomiting or abdominal pain.  Does not report any constipation or  diarrhea.  Does not report any skeletal complaints.    Does not report frequency, urgency or hematuria.  Does not report any skin rashes or lesions. Does not report any heat or cold intolerance.  Does not report any lymphadenopathy or petechiae.  Does not report any anxiety or depression.  Remaining review of systems is negative.    Past Medical History:  Diagnosis Date  . AKI (acute kidney injury) ([place]) 09/24/2016  . Calcium oxalate crystals in urine 12/23/2011   Asymptomatic, no hematuria. Advised to take plenty of water.  [name] Kitchen COPD (chronic obstructive pulmonary disease) ([place])   . Depression   . Diabetes mellitus without complication ([place]) 3785   pre diabetic  . Hepatitis C   . HIV (human immunodeficiency virus infection) ([place])   . Hypertension   . Peripheral arterial disease ([place])    ooccluded left SFA by duplex ultrasound, tibial disease left greater than right  . Prostate cancer metastatic to multiple sites [facility])   . Stroke ([place]) 11/2011   Carotids Doppler negative. Right sided weakness resolved, initially on Plavix for 3 months and then continued with ASA.    . TB lung, latent 1988   Treated  :  Past Surgical History:  Procedure Laterality Date  . HERNIA REPAIR    . INGUINAL HERNIA REPAIR  01/16/2012   Procedure: HERNIA REPAIR INGUINAL INCARCERATED;  Surgeon: Zenovia Jarred, MD;  Location: [place];  Service: General;  Laterality: Right;  . TEE WITHOUT CARDIOVERSION  12/07/2011   Procedure: TRANSESOPHAGEAL ECHOCARDIOGRAM (TEE);  Surgeon: Birdie Riddle, MD;  Location: [facility] ENDOSCOPY;  Service: Cardiovascular;  Laterality: N/A;  :   Current Outpatient Medications:  .  abiraterone acetate (ZYTIGA) 250 MG tablet, Take 4 tablets (1,000 mg total) by mouth daily. Take on an empty stomach 1 hour before or 2 hours after a meal, Disp: 120 tablet, Rfl: 0 .  albuterol (PROAIR HFA) 108 (90 Base) MCG/ACT inhaler, INHALE 2 PUFFS INTO THE LUNGS EVERY 6 HOURS AS NEEDED FOR WHEEZING OR SHORTNESS OF  BREATH, TO NOT USE EVERY DAY, USE ONLY IF NEEDED, Disp: 8.5 g, Rfl: 3 .  apixaban (ELIQUIS) 2.5 MG TABS tablet, Take 1 tablet (2.5 mg total) by mouth 2 (two) times daily. Starting 8/21, Disp: 60 tablet, Rfl: 1 .  apixaban (ELIQUIS) 5 MG TABS tablet, Take 1 tablet (5 mg total) by mouth 2 (two) times daily for 7 days. THEN, Disp: 14 tablet, Rfl: 0 .  aspirin 81 MG EC tablet, Take 1 tablet (81 mg total) by mouth daily., Disp: 30 tablet, Rfl: 5 .  azelastine (ASTELIN) 0.1 % nasal spray, Place 1 spray into both nostrils 2 (two) times daily. Use in each nostril as directed (Patient taking differently: Place 1 spray into both nostrils 2 (two) times daily as needed for rhinitis. ), Disp: 30 mL, Rfl: 3 .  Blood Glucose Monitoring Suppl (ONETOUCH VERIO) w/Device KIT, 1 Device by Does not apply route 2 (two) times daily. ICD 10  E11.8, non-insulin dependent, test up to twice daily, Disp: 1 kit, Rfl: 0 .  Budesonide 90 MCG/ACT inhaler, Inhale 1-2 puffs into the lungs 2 (two) times daily., Disp: 1 Inhaler, Rfl: 3 .  cholecalciferol (VITAMIN D) 1000 units tablet, Take 1 tablet (1,000 Units total) by mouth daily., Disp: 90 tablet, Rfl: 3 .  citalopram (CELEXA) 20 MG tablet, Take 20 mg by mouth daily., Disp: , Rfl:  .  diclofenac sodium (VOLTAREN) 1 % GEL, APPLY 4 GRAMS EXTERNALLY TO THE AFFECTED AREA FOUR TIMES DAILY AS NEEDED FOR PAIN, Disp: 100 g, Rfl: 0 .  gabapentin (NEURONTIN) 300 MG capsule, Take 2 capsules (600 mg total) by mouth 2 (two) times daily., Disp: 120 capsule, Rfl: 2 .  GENVOYA 150-150-200-10 MG TABS tablet, TAKE 1 TABLET BY MOUTH DAILY WITH BREAKFAST (Patient taking differently: Take 1 tablet by mouth daily. ), Disp: 30 tablet, Rfl: 4 .  glucose blood (ONETOUCH VERIO) test strip, Use as instructed ICD 10  E11.8, non-insulin dependent, test up to twice daily, Disp: 100 each, Rfl: 11 .  hydroxypropyl methylcellulose / hypromellose (ISOPTO TEARS / GONIOVISC) 2.5 % ophthalmic solution, Place 1 drop into  both eyes as needed for dry eyes., Disp: , Rfl:  .  hydrOXYzine (VISTARIL) 50 MG capsule, Take 50 mg by mouth at bedtime. , Disp: , Rfl:  .  metFORMIN (GLUCOPHAGE) 500 MG tablet, Take 1 tablet (500 mg total) by mouth daily with breakfast., Disp: 30 tablet, Rfl: 0 .  ONETOUCH DELICA LANCETS FINE MISC, 1 Stick by Does not apply route 2 (two) times daily. ICD 10  E11.8, non-insulin dependent, test up to twice daily, Disp: 100 each, Rfl: 11 .  oxyCODONE 10 MG TABS, Take 1 tablet (10 mg total) by mouth every 6 (six) hours as needed for severe pain or breakthrough pain., Disp: 50 tablet, Rfl: 0 .  polyethylene glycol (MIRALAX / GLYCOLAX) packet, Take 17 g by mouth  daily., Disp: 14 each, Rfl: 0 .  pravastatin (PRAVACHOL) 40 MG tablet, Take 1 tablet (40 mg total) by mouth daily., Disp: 90 tablet, Rfl: 1 .  predniSONE (DELTASONE) 5 MG tablet, Take 1 tablet (5 mg total) by mouth daily with breakfast., Disp: 90 tablet, Rfl: 0 .  PREZISTA 800 MG tablet, TAKE 1 TABLET BY MOUTH EVERY DAY WITH BREAKFAST (Patient taking differently: Take 800 mg by mouth daily. ), Disp: 30 tablet, Rfl: 4 .  QUEtiapine (SEROQUEL) 300 MG tablet, Take 300 mg by mouth 2 (two) times daily. , Disp: , Rfl:  .  tamsulosin (FLOMAX) 0.4 MG CAPS capsule, TAKE 1 CAPSULE(0.4 MG) BY MOUTH DAILY AFTER BREAKFAST (Patient taking differently: Take 0.4 mg by mouth daily. TAKE 1 CAPSULE(0.4 MG) BY MOUTH DAILY AFTER BREAKFAST), Disp: 6 capsule, Rfl: 0 .  Tiotropium Bromide-Olodaterol (STIOLTO RESPIMAT) 2.5-2.5 MCG/ACT AERS, Inhale 2 puffs into the lungs daily., Disp: 3 Inhaler, Rfl: 3 .  valACYclovir (VALTREX) 500 MG tablet, TAKE 1 TABLET BY MOUTH DAILY (Patient taking differently: Take 500 mg by mouth daily. ), Disp: 90 tablet, Rfl: 2:  Allergies  Allergen Reactions  . Statins Other (See Comments)    Elevated liver enzymes.  :  Family History  Problem Relation Age of Onset  . Hypertension Mother   . Heart attack Mother   . Stroke Mother   .  Hypertension Father   . Cancer Father   :  Social History   Socioeconomic History  . Marital status: Married    Spouse name: Not on file  . Number of children: Not on file  . Years of education: Not on file  . Highest education level: Not on file  Occupational History  . Not on file  Social Needs  . Financial resource strain: Not on file  . Food insecurity:    Worry: Not on file    Inability: Not on file  . Transportation needs:    Medical: Not on file    Non-medical: Not on file  Tobacco Use  . Smoking status: Current Every Day Smoker    Packs/day: 0.50    Types: Cigarettes    Last attempt to quit: 11/18/2014    Years since quitting: 3.3  . Smokeless tobacco: Never Used  . Tobacco comment: .5 PPD  Substance and Sexual Activity  . Alcohol use: No    Alcohol/week: 0.0 standard drinks  . Drug use: Yes    Frequency: 3.0 times per week    Types: "Crack" cocaine, Cocaine, Marijuana    Comment: pt reports last use last night 02-26-18  . Sexual activity: Not on file    Comment: declined condoms  Lifestyle  . Physical activity:    Days per week: Not on file    Minutes per session: Not on file  . Stress: Not on file  Relationships  . Social connections:    Talks on phone: Not on file    Gets together: Not on file    Attends religious service: Not on file    Active member of club or organization: Not on file    Attends meetings of clubs or organizations: Not on file    Relationship status: Not on file  . Intimate partner violence:    Fear of current or ex partner: Not on file    Emotionally abused: Not on file    Physically abused: Not on file    Forced sexual activity: Not on file  Other Topics Concern  . Not on  file  Social History Narrative   Pt lives with wife and grandkid in [place].   Has applied for disability and is pending.   Worked in [place] before about 3 years ago.     :  Pertinent items are noted in HPI.  Exam: Blood pressure (!) 155/93, pulse  95, temperature 98.7 F (37.1 C), temperature source Oral, resp. rate 18, height 6' (1.829 m), weight 219 lb 8 oz (99.6 kg), SpO2 100 %.  ECOG 1 General appearance: alert and cooperative appeared without distress. Head: atraumatic without any abnormalities. Eyes: conjunctivae/corneas clear. PERRL.  Sclera anicteric. Throat: lips, mucosa, and tongue normal; without oral thrush or ulcers. Resp: clear to auscultation bilaterally without rhonchi, wheezes or dullness to percussion. Cardio: regular rate and rhythm, S1, S2 normal, no murmur, click, rub or gallop GI: soft, non-tender; bowel sounds normal; no masses,  no organomegaly Skin: Skin color, texture, turgor normal. No rashes or lesions Lymph nodes: Cervical, supraclavicular, and axillary nodes normal. Neurologic: Grossly normal without any motor, sensory or deep tendon reflexes. Musculoskeletal: No joint deformity or effusion.  CBC    Component Value Date/Time   WBC 9.6 02/26/2018 1129   RBC 3.81 (L) 02/26/2018 1129   HGB 10.3 (L) 02/26/2018 1129   HGB 12.8 05/14/2016 1515   HCT 33.0 (L) 02/26/2018 1129   HCT 39.2 05/14/2016 1515   PLT 245 02/26/2018 1129   PLT 214 05/14/2016 1515   MCV 86.6 02/26/2018 1129   MCV 85 05/14/2016 1515   MCH 27.0 02/26/2018 1129   MCHC 31.2 02/26/2018 1129   RDW 15.8 (H) 02/26/2018 1129   RDW 14.7 05/14/2016 1515   LYMPHSABS 1.1 09/21/2016 2310   LYMPHSABS 4.6 (H) 05/14/2016 1515   MONOABS 1.1 (H) 09/21/2016 2310   EOSABS 0.1 09/21/2016 2310   EOSABS 0.1 05/14/2016 1515   BASOSABS 0.0 09/21/2016 2310   BASOSABS 0.0 05/14/2016 1515     Chemistry      Component Value Date/Time   NA 138 03/01/2018 0511   NA 140 01/27/2016 1527   K 3.6 03/01/2018 0511   CL 103 03/01/2018 0511   CO2 25 03/01/2018 0511   BUN 8 03/01/2018 0511   BUN 14 01/27/2016 1527   CREATININE 1.31 (H) 03/01/2018 0511   CREATININE 1.51 (H) 11/04/2017 1129      Component Value Date/Time   CALCIUM 8.9 03/01/2018 0511    ALKPHOS 88 03/01/2018 0511   AST 16 03/01/2018 0511   ALT 9 03/01/2018 0511   BILITOT 0.3 03/01/2018 0511       Ct Chest W Contrast  Result Date: 02/27/2018 CLINICAL DATA:  History of sarcoma. Evaluate for metastatic disease. EXAM: CT CHEST WITH CONTRAST TECHNIQUE: Multidetector CT imaging of the chest was performed during intravenous contrast administration. CONTRAST:  14m OMNIPAQUE IOHEXOL 300 MG/ML  SOLN COMPARISON:  CT chest 03/11/2016 FINDINGS: Cardiovascular: Normal heart size. Aortic atherosclerosis. Calcification in the left main, lad and left circumflex coronary arteries. Mediastinum/Nodes: Normal appearance of the thyroid gland. The trachea appears patent and is midline. No enlarged axillary, supraclavicular, mediastinal or hilar adenopathy. Lungs/Pleura: Small bilateral pleural effusions identified. Subsegmental atelectasis is noted in the lingula and both lower lobes. No airspace consolidation or pneumothorax identified. No suspicious pulmonary nodules. Upper Abdomen: No acute abnormality identified. Musculoskeletal: Mixed lytic and sclerotic bone metastasis is identified involving the T2 vertebra, image 108/9. T9 and T10 metastasis are also identified. Sclerotic lesion within the left seventh rib is identified, image number 94/5. IMPRESSION: 1. No  suspicious pulmonary nodules identified 2. Small bilateral pleural effusions. 3. Mixed lytic and sclerotic bone metastasis involving the T2 vertebra, T9 and T10 vertebra. See report from MRI dated 02/27/2018. 4. Sclerotic lesion within the left seventh rib is identified. Suspicious for metastasis. 5. Multi vessel coronary artery atherosclerotic calcifications. Aortic Atherosclerosis (ICD10-I70.0). Electronically Signed   By: Kerby Moors M.D.   On: 02/27/2018 23:10   Mr Thoracic Spine W Wo Contrast  Result Date: 02/27/2018 CLINICAL DATA:  He does occasionally have nausea and the pain is 10 out of 10 but does not seem to be exacerbated by food.  It does seem to be worse at night when he lays down and when he sits up in the morning. EXAM: MRI THORACIC AND LUMBAR SPINE WITHOUT AND WITH CONTRAST TECHNIQUE: Multiplanar and multiecho pulse sequences of the thoracic and lumbar spine were obtained without and with intravenous contrast. CONTRAST:  91m MULTIHANCE GADOBENATE DIMEGLUMINE 529 MG/ML IV SOLN COMPARISON:  None. FINDINGS: MRI THORACIC SPINE FINDINGS Alignment:  Physiologic. Vertebrae: Vertebral body heights are maintained. No acute fracture or static listhesis. 13 mm enhancing bone lesion in the left posterior T2 vertebral body. Heterogeneous marrow edema and T1 hypointensity throughout the T10 vertebral body with heterogeneous enhancement. Small bone lesion in the posterior T5 vertebral body. Small 8 mm bone lesion in the T9 vertebral body. Small Schmorl's node along the superior endplate of TJ50with surrounding marrow edema without significant enhancement as can be seen with painful Schmorl's node. Cord:  Normal signal and morphology. Paraspinal and other soft tissues: No acute paraspinal abnormality. Disc levels: Disc spaces: Disc spaces are maintained T1-T2: No disc protrusion, foraminal stenosis or central canal stenosis. T2-T3: No disc protrusion, foraminal stenosis or central canal stenosis. T3-T4: No disc protrusion, foraminal stenosis or central canal stenosis. T4-T5: No disc protrusion, foraminal stenosis or central canal stenosis. T5-T6: No disc protrusion, foraminal stenosis or central canal stenosis. T6-T7: No disc protrusion, foraminal stenosis or central canal stenosis. T7-T8: No disc protrusion, foraminal stenosis or central canal stenosis. T8-T9: No disc protrusion, foraminal stenosis or central canal stenosis. T9-T10: No disc protrusion, foraminal stenosis or central canal stenosis. T10-T11: No disc protrusion, foraminal stenosis or central canal stenosis. T11-T12: No disc protrusion, foraminal stenosis or central canal stenosis. MRI  LUMBAR SPINE FINDINGS Segmentation:  Standard. Alignment:  Physiologic. Vertebrae: No acute fracture. No discitis. Small bone lesion in the posterior L3 vertebral body. Small bone lesion in the L1 vertebral body. Small focal bone lesions in the L4 vertebral body. Conus medullaris: Extends to the T12 level and appears normal. Abnormal dural enhancement extending from approximately T7 through T11 most significant at T9 through T11 which may be reactive versus secondary to tumor infiltration. Paraspinal and other soft tissues: No acute paraspinal abnormality. Disc levels: Disc spaces: Degenerative disc disease with disc height loss at L4-5 with reactive endplate changes. T12-L1: No significant disc bulge. No evidence of neural foraminal stenosis. No central canal stenosis. L1-L2: No significant disc bulge. No evidence of neural foraminal stenosis. No central canal stenosis. L2-L3: No significant disc bulge. No evidence of neural foraminal stenosis. No central canal stenosis. Mild left facet arthropathy. L3-L4: Broad-based disc bulge. Mild bilateral facet arthropathy. Moderate spinal stenosis. No evidence of neural foraminal stenosis. L4-L5: Broad-based disc bulge eccentric towards the left. Mild bilateral facet arthropathy with a small left facet effusion. Severe left foraminal stenosis. Mild right foraminal stenosis. L5-S1: Mild broad-based disc bulge. Mild bilateral facet arthropathy. Mild bilateral foraminal stenosis. No evidence of  neural foraminal stenosis. No central canal stenosis. IMPRESSION: THORACIC SPINE 1. Abnormal bone lesions in the T2, T5, T9 and T10 vertebral bodies most concerning for metastatic disease versus multiple myeloma. Abnormal dural enhancement extending from approximately T7 through T11 most significant at T9 through T11 which may be reactive versus secondary to tumor infiltration. 2. Small Schmorl's node along the superior endplate of R15 with surrounding marrow edema without significant  enhancement as can be seen with painful Schmorl's node. LUMBAR SPINE 1. Abnormal bone lesions involving the L1, L3 and L4 vertebral bodies most concerning for metastatic disease versus multiple myeloma. 2. At L5-S1 there is a mild broad-based disc bulge. Mild bilateral facet arthropathy. Mild bilateral foraminal stenosis. Electronically Signed   By: Kathreen Devoid   On: 02/27/2018 11:40   Mr Lumbar Spine W Wo Contrast  Result Date: 02/27/2018 CLINICAL DATA:  He does occasionally have nausea and the pain is 10 out of 10 but does not seem to be exacerbated by food. It does seem to be worse at night when he lays down and when he sits up in the morning. EXAM: MRI THORACIC AND LUMBAR SPINE WITHOUT AND WITH CONTRAST TECHNIQUE: Multiplanar and multiecho pulse sequences of the thoracic and lumbar spine were obtained without and with intravenous contrast. CONTRAST:  50m MULTIHANCE GADOBENATE DIMEGLUMINE 529 MG/ML IV SOLN COMPARISON:  None. FINDINGS: MRI THORACIC SPINE FINDINGS Alignment:  Physiologic. Vertebrae: Vertebral body heights are maintained. No acute fracture or static listhesis. 13 mm enhancing bone lesion in the left posterior T2 vertebral body. Heterogeneous marrow edema and T1 hypointensity throughout the T10 vertebral body with heterogeneous enhancement. Small bone lesion in the posterior T5 vertebral body. Small 8 mm bone lesion in the T9 vertebral body. Small Schmorl's node along the superior endplate of TX45with surrounding marrow edema without significant enhancement as can be seen with painful Schmorl's node. Cord:  Normal signal and morphology. Paraspinal and other soft tissues: No acute paraspinal abnormality. Disc levels: Disc spaces: Disc spaces are maintained T1-T2: No disc protrusion, foraminal stenosis or central canal stenosis. T2-T3: No disc protrusion, foraminal stenosis or central canal stenosis. T3-T4: No disc protrusion, foraminal stenosis or central canal stenosis. T4-T5: No disc  protrusion, foraminal stenosis or central canal stenosis. T5-T6: No disc protrusion, foraminal stenosis or central canal stenosis. T6-T7: No disc protrusion, foraminal stenosis or central canal stenosis. T7-T8: No disc protrusion, foraminal stenosis or central canal stenosis. T8-T9: No disc protrusion, foraminal stenosis or central canal stenosis. T9-T10: No disc protrusion, foraminal stenosis or central canal stenosis. T10-T11: No disc protrusion, foraminal stenosis or central canal stenosis. T11-T12: No disc protrusion, foraminal stenosis or central canal stenosis. MRI LUMBAR SPINE FINDINGS Segmentation:  Standard. Alignment:  Physiologic. Vertebrae: No acute fracture. No discitis. Small bone lesion in the posterior L3 vertebral body. Small bone lesion in the L1 vertebral body. Small focal bone lesions in the L4 vertebral body. Conus medullaris: Extends to the T12 level and appears normal. Abnormal dural enhancement extending from approximately T7 through T11 most significant at T9 through T11 which may be reactive versus secondary to tumor infiltration. Paraspinal and other soft tissues: No acute paraspinal abnormality. Disc levels: Disc spaces: Degenerative disc disease with disc height loss at L4-5 with reactive endplate changes. T12-L1: No significant disc bulge. No evidence of neural foraminal stenosis. No central canal stenosis. L1-L2: No significant disc bulge. No evidence of neural foraminal stenosis. No central canal stenosis. L2-L3: No significant disc bulge. No evidence of neural foraminal stenosis. No  central canal stenosis. Mild left facet arthropathy. L3-L4: Broad-based disc bulge. Mild bilateral facet arthropathy. Moderate spinal stenosis. No evidence of neural foraminal stenosis. L4-L5: Broad-based disc bulge eccentric towards the left. Mild bilateral facet arthropathy with a small left facet effusion. Severe left foraminal stenosis. Mild right foraminal stenosis. L5-S1: Mild broad-based disc  bulge. Mild bilateral facet arthropathy. Mild bilateral foraminal stenosis. No evidence of neural foraminal stenosis. No central canal stenosis. IMPRESSION: THORACIC SPINE 1. Abnormal bone lesions in the T2, T5, T9 and T10 vertebral bodies most concerning for metastatic disease versus multiple myeloma. Abnormal dural enhancement extending from approximately T7 through T11 most significant at T9 through T11 which may be reactive versus secondary to tumor infiltration. 2. Small Schmorl's node along the superior endplate of M09 with surrounding marrow edema without significant enhancement as can be seen with painful Schmorl's node. LUMBAR SPINE 1. Abnormal bone lesions involving the L1, L3 and L4 vertebral bodies most concerning for metastatic disease versus multiple myeloma. 2. At L5-S1 there is a mild broad-based disc bulge. Mild bilateral facet arthropathy. Mild bilateral foraminal stenosis. Electronically Signed   By: Kathreen Devoid   On: 02/27/2018 11:40     Nm Bone Scan Whole Body  Result Date: 03/01/2018 CLINICAL DATA:  20.7 mci 75mcmdp iv/ ho prostate ca with lupron tx. Quarterly per patient/ no recent PSA available. New onset LBP with left flank pain/ no trauma/ eval for mets to bone EXAM: NUCLEAR MEDICINE WHOLE BODY BONE SCAN TECHNIQUE: Whole body anterior and posterior images were obtained approximately 3 hours after intravenous injection of radiopharmaceutical. RADIOPHARMACEUTICALS:  Twenty mCi Technetium-949mDP IV COMPARISON:  Bone scan, 09/16/2016 FINDINGS: There are new areas of abnormal radiotracer localization when compared to the prior study. There is focal uptake in the upper thoracic spine corresponding to T2 with abnormal uptake in the lower thoracic spine corresponding to T10 and T11. This is most intense involving the T10 vertebra. There is focal abnormal uptake involving the posterior left sixth rib. There is a small focal area of abnormal uptake along the superior right iliac crest. There  is a focus of uptake projecting along the inferomedial left orbit. Uptake is seen involving the sternoclavicular joints, shoulders, knees and right foot that appears degenerative and relatively stable from the prior study. Renal uptake is symmetric. IMPRESSION: 1. There are new areas of abnormal radiotracer localization to bone as detailed above, involving the thoracic spine, left posterior sixth rib and superior right iliac crest, consistent with metastatic disease to bone. Another focal area of uptake projecting along the inferior, medial left orbit is more nonspecific and could be reactive or metastatic uptake. Electronically Signed   By: DaLajean Manes.D.   On: 03/01/2018 14:10   Ct Abdomen Pelvis W Contrast  Result Date: 02/26/2018 CLINICAL DATA:  Right upper quadrant abdomen pain for 1 week. EXAM: CT ABDOMEN AND PELVIS WITH CONTRAST TECHNIQUE: Multidetector CT imaging of the abdomen and pelvis was performed using the standard protocol following bolus administration of intravenous contrast. CONTRAST:  10058mMNIPAQUE IOHEXOL 300 MG/ML  SOLN COMPARISON:  March 17, 2013 FINDINGS: Lower chest: Mild dependent atelectasis of posterior lung bases are noted. The heart size is normal. Hepatobiliary: There is diffuse low density of the liver. No focal liver lesions identified. Gallstones identified in the gallbladder. There is no inflammatory change around gallbladder. The biliary tree is normal. Pancreas: Unremarkable. No pancreatic ductal dilatation or surrounding inflammatory changes. Spleen: Normal in size without focal abnormality. Adrenals/Urinary Tract: The adrenal glands  are normal. There are multiple cysts in bilateral kidneys. No hydronephrosis is identified bilaterally. The bladder is normal. Stomach/Bowel: Stomach is within normal limits. Appendix appears normal. No evidence of bowel wall thickening, distention, or inflammatory changes. Vascular/Lymphatic: Aortic atherosclerosis. No enlarged abdominal  or pelvic lymph nodes. Reproductive: Prostate is unremarkable. Other: No abdominal wall hernia or abnormality. No abdominopelvic ascites. Musculoskeletal: There is 2.7 x 2.8 cm sclerotic lesion in the T10 vertebral body, new since the prior exam. Degenerative joint changes of the spine are noted. IMPRESSION: No acute abnormality identified in the abdomen and pelvis. 2.8 cm focal sclerotic lesion in the T10 vertebral body, new compared to prior CT of March 17, 2013. Clinical correlation regarding history of primary neoplasm is recommended as metastatic lesion is a differential possibility. Mild fatty infiltration of liver. Cholelithiasis without evidence of cholecystitis on CT. Cyst in both kidneys. Electronically Signed   By: Abelardo Diesel M.D.   On: 02/26/2018 16:53      Assessment and Plan:   62 year old man with the following:  1.  Prostate cancer diagnosed in 2018.  He presented with a PSA of 3.1 on December 1 of 2017.  He underwent a prostate biopsy in February 2018 which showed a Gleason score 4+5 = 9 and 2 cores and multiple other cores of Gleason 8 and 6.  Lupron under the care of [name] since that time.  His PSA was as low as 0.35 and July 2018.  It was 0.47 in August 2018 and 2.51 in April 2019.  He presented in August 2019 with elevated PSA of 7.0 and worsening bony metastasis.  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 CT scan and MRIs were reviewed including his bone scan in 2019 compared to his earlier bone scan for eighth 2018.  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  Options of therapy were reviewed today which include systemic chemotherapy, Zytiga or Xtandi.  Cussing the risks and benefits of all these approaches I recommended proceeding with Zytiga at 1000 mg daily with prednisone at 5 mg daily.  This medication offers no drug interaction with his HIV  medication as well as his anticoagulation.  The other 2 options of chemotherapy and Gillermina Phy can be potentially problematic.  Complication associated with this medication include nausea, fatigue, edema, hypertension and hypokalemia.  The benefit would be improving and is disease control as well as reduction in his PSA and improvement in his overall survival.  He understands he has an incurable malignancy.  He is agreeable to proceed and we will start him in the immediate future.   2.  Androgen deprivation: I have recommended continuing Lupron indefinitely which she has been receiving under the care of [name].  3.  Bone metastasis: He has few critical lesions in his thoracic spine.  I will refer him to radiation oncology for an evaluation to prophylactically treat these lesions before they become clinically significant.  This will also offer him better pain control at this time.  Pain is reasonably controlled at this time with oxycodone and Neurontin.  4.  Bone directed therapy: This will be deferred to a later date after he obtains dental evaluation before starting Xgeva.  5.  Prognosis and goals of care: Treatment is palliative at this time but his performance status is adequate and aggressive therapy is warranted.  6.  Follow-up: We will be in 4 weeks  to follow his progress.    Thank you for the referral.  A copy of this consult has been forwarded to the requesting physician.

## 2018-03-11 NOTE — Telephone Encounter (Signed)
Oral Oncology Patient Advocate Encounter  Prior Authorization for Fabio Asa has been approved.    PA# [id number] Effective dates: 03/11/18 through 09/07/18  Oral Oncology Clinic will continue to follow.   [place] Patient [place] Phone [phone] Fax [phone]

## 2018-03-14 DIAGNOSIS — R296 Repeated falls: Secondary | ICD-10-CM | POA: Diagnosis not present

## 2018-03-14 MED FILL — ABIRATERONE ACETATE 250 MG: 250 | 30 days supply | Qty: 120 | Fill #0

## 2018-03-14 NOTE — Telephone Encounter (Signed)
Oral Oncology Patient Advocate Encounter  Confirmed with [place] that Fabio Asa was shipped 03/14/18.  [place] Patient [place] Phone [phone] Fax [phone]

## 2018-03-14 NOTE — Telephone Encounter (Signed)
Oral Chemotherapy Pharmacist Encounter   Attempted to reach patient to provide update and offer for initial counseling on oral medication: Zytiga.  No answer. Left VM for patient to call back for initial counseling and medication acquisition.   [name], PharmD, BCPS, BCOP  03/14/2018   9:25 AM Oral Oncology Clinic [phone]

## 2018-03-14 NOTE — Telephone Encounter (Signed)
Oral Chemotherapy Pharmacist Encounter   I spoke with patient for overview of: Zytiga.   Counseled patient on administration, dosing, side effects, monitoring, drug-food interactions, safe handling, storage, and disposal.  Patient will take Zytiga 250mg  tablets, 4 tablets (1000mg ) by mouth once daily on an empty stomach, 1 hour before or 2 hours after a meal.  Patient states he will take his Zyitga first thing in the morning and will wait at least 1 hour before eating.  Patient will take prednisone 5mg  tablet, 1 tablet by mouth one daily with breakfast.  Zytiga start date: 03/16/18  Adverse effects include but are not limited to: peripheral edema, GI upset, hypertension, hot flashes, fatigue, and arthralgias.    Prednisone prescription has been sent to [facility] in [place], [place]. Patient  Patient will obtain prednisone and knows to start prednisone on the same day as Zytiga start.  Reviewed with patient importance of keeping a medication schedule and plan for any missed doses.  Jacob Joseph voiced understanding and appreciation.   All questions answered. Medication reconciliation performed and medication/allergy list updated.  Jacob Joseph will ship from the [facility] today (03/14/2018) for copayment $0 Delivery expected tomorrow Patient will start Zytiga Wednesday morning.  Patient knows to call the office with questions or concerns.  Oral Oncology Clinic will continue to follow.  [name], PharmD, BCPS, BCOP  03/14/2018 11:18 AM Oral Oncology Clinic [phone]

## 2018-03-15 ENCOUNTER — Encounter: Payer: Self-pay | Admitting: Radiation Oncology

## 2018-03-15 ENCOUNTER — Other Ambulatory Visit: Payer: Self-pay | Admitting: Internal Medicine

## 2018-03-15 DIAGNOSIS — R296 Repeated falls: Secondary | ICD-10-CM | POA: Diagnosis not present

## 2018-03-15 DIAGNOSIS — M1990 Unspecified osteoarthritis, unspecified site: Secondary | ICD-10-CM

## 2018-03-16 ENCOUNTER — Other Ambulatory Visit: Payer: Self-pay | Admitting: *Deleted

## 2018-03-16 DIAGNOSIS — R296 Repeated falls: Secondary | ICD-10-CM | POA: Diagnosis not present

## 2018-03-16 MED ORDER — AZELASTINE HCL 0.1 % NA SOLN: 1.0000 | Freq: Two times a day (BID) | NASAL | 1 refills | Status: DC | PRN

## 2018-03-16 NOTE — Telephone Encounter (Signed)
Refill approved.

## 2018-03-17 DIAGNOSIS — R296 Repeated falls: Secondary | ICD-10-CM | POA: Diagnosis not present

## 2018-03-18 DIAGNOSIS — R296 Repeated falls: Secondary | ICD-10-CM | POA: Diagnosis not present

## 2018-03-24 ENCOUNTER — Encounter: Payer: Self-pay | Admitting: Medical Oncology

## 2018-03-24 ENCOUNTER — Other Ambulatory Visit: Payer: Self-pay

## 2018-03-24 ENCOUNTER — Ambulatory Visit
Admission: RE | Admit: 2018-03-24 | Discharge: 2018-03-24 | Disposition: A | Discharge status: Home / Self Care | Payer: 59 | Source: Ambulatory Visit | Attending: Radiation Oncology | Admitting: Radiation Oncology

## 2018-03-24 ENCOUNTER — Encounter: Payer: Self-pay | Admitting: Radiation Oncology

## 2018-03-24 VITALS — BP 143/93 | HR 105 | Temp 98.9°F | Resp 20 | Ht 72.0 in | Wt 210.2 lb

## 2018-03-24 DIAGNOSIS — E1151 Type 2 diabetes mellitus with diabetic peripheral angiopathy without gangrene: Secondary | ICD-10-CM | POA: Diagnosis not present

## 2018-03-24 DIAGNOSIS — Z8673 Personal history of transient ischemic attack (TIA), and cerebral infarction without residual deficits: Secondary | ICD-10-CM | POA: Insufficient documentation

## 2018-03-24 DIAGNOSIS — C7952 Secondary malignant neoplasm of bone marrow: Secondary | ICD-10-CM | POA: Diagnosis not present

## 2018-03-24 DIAGNOSIS — Z7901 Long term (current) use of anticoagulants: Secondary | ICD-10-CM | POA: Diagnosis not present

## 2018-03-24 DIAGNOSIS — I739 Peripheral vascular disease, unspecified: Secondary | ICD-10-CM | POA: Insufficient documentation

## 2018-03-24 DIAGNOSIS — C61 Malignant neoplasm of prostate: Secondary | ICD-10-CM | POA: Insufficient documentation

## 2018-03-24 DIAGNOSIS — F17210 Nicotine dependence, cigarettes, uncomplicated: Secondary | ICD-10-CM | POA: Insufficient documentation

## 2018-03-24 DIAGNOSIS — B1920 Unspecified viral hepatitis C without hepatic coma: Secondary | ICD-10-CM | POA: Diagnosis not present

## 2018-03-24 DIAGNOSIS — C7951 Secondary malignant neoplasm of bone: Secondary | ICD-10-CM | POA: Diagnosis present

## 2018-03-24 DIAGNOSIS — Z79899 Other long term (current) drug therapy: Secondary | ICD-10-CM | POA: Insufficient documentation

## 2018-03-24 DIAGNOSIS — Z888 Allergy status to other drugs, medicaments and biological substances status: Secondary | ICD-10-CM | POA: Diagnosis not present

## 2018-03-24 DIAGNOSIS — I10 Essential (primary) hypertension: Secondary | ICD-10-CM | POA: Insufficient documentation

## 2018-03-24 DIAGNOSIS — J449 Chronic obstructive pulmonary disease, unspecified: Secondary | ICD-10-CM | POA: Insufficient documentation

## 2018-03-24 DIAGNOSIS — Z7982 Long term (current) use of aspirin: Secondary | ICD-10-CM | POA: Diagnosis not present

## 2018-03-24 DIAGNOSIS — Z7984 Long term (current) use of oral hypoglycemic drugs: Secondary | ICD-10-CM | POA: Insufficient documentation

## 2018-03-24 DIAGNOSIS — B20 Human immunodeficiency virus [HIV] disease: Secondary | ICD-10-CM | POA: Diagnosis not present

## 2018-03-24 NOTE — Progress Notes (Signed)
Histology and Location of Primary Cancer: prostatic adenocarcinoma  Sites of Visceral and Bony Metastatic Disease: thoracic spine, ribs, right iliac crest, and L1, L3, and L4.   Location(s) of Symptomatic Metastases: Thoracic spine  Past/Anticipated chemotherapy by medical oncology, if any: Zytiga daily with prednisone recommended. Patient reports he has been taking Zytiga for one week now. Plans to continue Lupron with [place]. Next Lupron due in October. Delton See may be considered after dental evaluation. Patient has not had a dental evaluation nor is one scheduled.  Pain on a scale of 0-10 is: 1 Reports intermittent spine pain made worse with prolonged standing. Reports taking gabapentin one or twice per day. Reports he isn't taking his oxycodone.    If Spine Met(s), symptoms, if any, include:  Bowel/Bladder retention or incontinence (please describe): no  Numbness or weakness in extremities (please describe): Reports numbness in lower legs. Reports weakness in his legs requiring him to use a cane to ambulate.  Current Decadron regimen, if applicable: no  Ambulatory status? Walker? Wheelchair?: Ambulatory with Assistance of cane  SAFETY ISSUES:  Prior radiation? no  Pacemaker/ICD? no  Possible current pregnancy? no  Is the patient on methotrexate? no  Current Complaints / other details:  62 year old male. Married. Hx of diabetes, HIV, and chronic renal insufficiency. Father with hx of prostate ca. Maternal aunt with hx of lung ca. Patient on disability. Lives with wife. Has 9 grown adult children.

## 2018-03-24 NOTE — Progress Notes (Signed)
Radiation Oncology         [phone] ________________________________  Initial Outpatient Consultation  Name: Jacob Joseph MRN: [id number]  Date of Service: 03/24/2018 DOB: 04-22-1956  ZT:[id number], Sheppard Coil, MD  [name], MD   REFERRING PHYSICIAN: [name], MD  DIAGNOSIS: 62 y.o. 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    ICD-10-CM   1. Secondary malignant neoplasm of bone and bone marrow (HCC) C79.51    C79.52   2. Spine metastasis (HCC) C79.51     HISTORY OF PRESENT ILLNESS: Jacob Joseph is a 62 y.o. male seen at the request of Dr. Alen Blew for metastatic castration resistant prostate cancer. He was diagnosed with Stage T1c, Gleason 4+5 prostate cancer on 08/25/2016 after presenting with an elevated PSA of 23.1.  His metastatic workup revealed right-sided pelvic adenopathy. His bone scan was negative for any evidence of metastatic disease. He was started on ADT with Lupron by [name] on 09/24/2016, given every 4 months which he has continued. PSA nadir was 0.35 in July 2018. It was 0.47 in August 2018 and 2.51 in April 2019. He appears to have developed castration resistant disease with a PSA of 7.0 in August 2019 despite continuing ADT.    He was hospitalized August 10-13th 2019 for right-sided flank pain, and workup revealed incidental findings of bony metastatic disease. CT abdomen/pelvis on 02/26/18 showed no acute abnormality in the abdomen or pelvis, but there was a 2.8 cm focal sclerotic lesion identified in the T10 vertebral body. MRI of the thoracic and lumbar spine were performed for further evaluation and showed abnormal bone lesions in the T2, T5, T9, T10, L1, L3, and L4 vertebral bodies with abnormal dural enhancement extending from approximately T7 through T11, most significant at T9 through T11.  CT Chest did not show any suspicious pulmonary nodules, but there is a sclerotic lesion within the left 7th rib.  A bone scan on 03/01/2018 showed new areas of abnormal radiotracer localization, involving T11, left posterior 6th rib, and superior right iliac crest as compared to his prior bone scan from 08/2016. Another focal area of uptake projecting along the inferior medial left orbit is more nonspecific and could be reactive or metastatic uptake. He was started on oxycodone and Neurontin and was discharged home on Eliquis after new diagnosis of deep vein thrombosis.  The patient was seen by Dr. Alen Blew on 03/11/2018 and started on daily Zytiga with prednisone. He states that he has been taking Zytiga for one week now. He plans to continue Lupron injections with [name] with the next injection due in October 2019. He has kindly been referred today for discussion of palliative radiation treatment to the spine lesions.  Of note, the patient has COPD and regularly uses inhalers. He has HIV which is well-controlled under the care of Dr. Megan Salon. Most recent CD4 count in 10/2017 was 580.   PREVIOUS RADIATION THERAPY: No  PAST MEDICAL HISTORY:  Past Medical History:  Diagnosis Date  . AKI (acute kidney injury) ([place]) 09/24/2016  . Calcium oxalate crystals in urine 12/23/2011   Asymptomatic, no hematuria. Advised to take plenty of water.  [name] Kitchen COPD (chronic obstructive pulmonary disease) ([place])   . Depression   . Diabetes mellitus without complication ([place]) 9833   pre diabetic  . Hepatitis C   . HIV (human immunodeficiency virus infection) ([place])   . Hypertension   . Peripheral arterial disease ([place])    ooccluded left SFA by  duplex ultrasound, tibial disease left greater than right  . Prostate cancer metastatic to multiple sites [facility])   . Stroke ([place]) 11/2011   Carotids Doppler negative. Right sided weakness resolved, initially on Plavix for 3 months and then continued with ASA.    . TB lung, latent 1988   Treated      PAST SURGICAL HISTORY: Past Surgical History:  Procedure Laterality Date  . HERNIA REPAIR     . INGUINAL HERNIA REPAIR  01/16/2012   Procedure: HERNIA REPAIR INGUINAL INCARCERATED;  Surgeon: Zenovia Jarred, MD;  Location: [facility];  Service: General;  Laterality: Right;  . TEE WITHOUT CARDIOVERSION  12/07/2011   Procedure: TRANSESOPHAGEAL ECHOCARDIOGRAM (TEE);  Surgeon: Birdie Riddle, MD;  Location: [facility] ENDOSCOPY;  Service: Cardiovascular;  Laterality: N/A;    FAMILY HISTORY:  Family History  Problem Relation Age of Onset  . Hypertension Mother   . Heart attack Mother   . Stroke Mother   . Hypertension Father   . Cancer Father     SOCIAL HISTORY:  Social History   Socioeconomic History  . Marital status: Married    Spouse name: Not on file  . Number of children: Not on file  . Years of education: Not on file  . Highest education level: Not on file  Occupational History  . Occupation: disabled  Social Needs  . Financial resource strain: Not on file  . Food insecurity:    Worry: Not on file    Inability: Not on file  . Transportation needs:    Medical: Not on file    Non-medical: Not on file  Tobacco Use  . Smoking status: Current Every Day Smoker    Packs/day: 0.50    Types: Cigarettes    Last attempt to quit: 11/18/2014    Years since quitting: 3.3  . Smokeless tobacco: Never Used  . Tobacco comment: .5 PPD  Substance and Sexual Activity  . Alcohol use: No    Alcohol/week: 0.0 standard drinks  . Drug use: Yes    Frequency: 3.0 times per week    Types: "Crack" cocaine, Cocaine, Marijuana    Comment: pt reports last use last night 02-26-18  . Sexual activity: Not on file    Comment: declined condoms  Lifestyle  . Physical activity:    Days per week: Not on file    Minutes per session: Not on file  . Stress: Not on file  Relationships  . Social connections:    Talks on phone: Not on file    Gets together: Not on file    Attends religious service: Not on file    Active member of club or organization: Not on file    Attends meetings of clubs or  organizations: Not on file    Relationship status: Not on file  . Intimate partner violence:    Fear of current or ex partner: Not on file    Emotionally abused: Not on file    Physically abused: Not on file    Forced sexual activity: Not on file  Other Topics Concern  . Not on file  Social History Narrative   Pt lives with wife and grandkid in [place].   Has applied for disability and is pending.   Worked in [employer] before about 3 years ago.       ALLERGIES: Statins  MEDICATIONS:  Current Outpatient Medications  Medication Sig Dispense Refill  . abiraterone acetate (ZYTIGA) 250 MG tablet Take 4 tablets (1,000 mg total)  by mouth daily. Take on an empty stomach 1 hour before or 2 hours after a meal 120 tablet 0  . albuterol (PROAIR HFA) 108 (90 Base) MCG/ACT inhaler INHALE 2 PUFFS INTO THE LUNGS EVERY 6 HOURS AS NEEDED FOR WHEEZING OR SHORTNESS OF BREATH, TO NOT USE EVERY DAY, USE ONLY IF NEEDED 8.5 g 3  . apixaban (ELIQUIS) 2.5 MG TABS tablet Take 1 tablet (2.5 mg total) by mouth 2 (two) times daily. Starting 8/21 60 tablet 1  . aspirin 81 MG EC tablet Take 1 tablet (81 mg total) by mouth daily. 30 tablet 5  . azelastine (ASTELIN) 0.1 % nasal spray Place 1 spray into both nostrils 2 (two) times daily as needed for rhinitis. 30 mL 1  . Blood Glucose Monitoring Suppl (ONETOUCH VERIO) w/Device KIT 1 Device by Does not apply route 2 (two) times daily. ICD 10  E11.8, non-insulin dependent, test up to twice daily 1 kit 0  . Budesonide 90 MCG/ACT inhaler Inhale 1-2 puffs into the lungs 2 (two) times daily. 1 Inhaler 3  . cholecalciferol (VITAMIN D) 1000 units tablet Take 1 tablet (1,000 Units total) by mouth daily. 90 tablet 3  . citalopram (CELEXA) 20 MG tablet Take 20 mg by mouth daily.    . diclofenac sodium (VOLTAREN) 1 % GEL APPLY 4 GRAMS EXTERNALLY TO THE AFFECTED AREA FOUR TIMES DAILY AS NEEDED FOR PAIN 100 g 0  . gabapentin (NEURONTIN) 300 MG capsule Take 2 capsules (600 mg total)  by mouth 2 (two) times daily. 120 capsule 2  . GENVOYA 150-150-200-10 MG TABS tablet TAKE 1 TABLET BY MOUTH DAILY WITH BREAKFAST (Patient taking differently: Take 1 tablet by mouth daily. ) 30 tablet 4  . glucose blood (ONETOUCH VERIO) test strip Use as instructed ICD 10  E11.8, non-insulin dependent, test up to twice daily 100 each 11  . hydroxypropyl methylcellulose / hypromellose (ISOPTO TEARS / GONIOVISC) 2.5 % ophthalmic solution Place 1 drop into both eyes as needed for dry eyes.    . hydrOXYzine (VISTARIL) 50 MG capsule Take 50 mg by mouth at bedtime.     . metFORMIN (GLUCOPHAGE) 500 MG tablet Take 1 tablet (500 mg total) by mouth daily with breakfast. 30 tablet 0  . ONETOUCH DELICA LANCETS FINE MISC 1 Stick by Does not apply route 2 (two) times daily. ICD 10  E11.8, non-insulin dependent, test up to twice daily 100 each 11  . oxyCODONE 10 MG TABS Take 1 tablet (10 mg total) by mouth every 6 (six) hours as needed for severe pain or breakthrough pain. 50 tablet 0  . polyethylene glycol (MIRALAX / GLYCOLAX) packet Take 17 g by mouth daily. 14 each 0  . pravastatin (PRAVACHOL) 40 MG tablet Take 1 tablet (40 mg total) by mouth daily. 90 tablet 1  . predniSONE (DELTASONE) 5 MG tablet Take 1 tablet (5 mg total) by mouth daily with breakfast. 90 tablet 0  . PREZISTA 800 MG tablet TAKE 1 TABLET BY MOUTH EVERY DAY WITH BREAKFAST (Patient taking differently: Take 800 mg by mouth daily. ) 30 tablet 4  . QUEtiapine (SEROQUEL) 300 MG tablet Take 300 mg by mouth 2 (two) times daily.     . tamsulosin (FLOMAX) 0.4 MG CAPS capsule TAKE 1 CAPSULE(0.4 MG) BY MOUTH DAILY AFTER BREAKFAST (Patient taking differently: Take 0.4 mg by mouth daily. TAKE 1 CAPSULE(0.4 MG) BY MOUTH DAILY AFTER BREAKFAST) 6 capsule 0  . Tiotropium Bromide-Olodaterol (STIOLTO RESPIMAT) 2.5-2.5 MCG/ACT AERS Inhale 2 puffs into  the lungs daily. 3 Inhaler 3  . valACYclovir (VALTREX) 500 MG tablet TAKE 1 TABLET BY MOUTH DAILY (Patient taking  differently: Take 500 mg by mouth daily. ) 90 tablet 2  . apixaban (ELIQUIS) 5 MG TABS tablet Take 1 tablet (5 mg total) by mouth 2 (two) times daily for 7 days. THEN 14 tablet 0   No current facility-administered medications for this encounter.     REVIEW OF SYSTEMS:  On review of systems, the patient reports that he is doing well overall. He denies any chest pain, shortness of breath, cough, fevers, chills, night sweats, or unintended weight changes. He denies any bowel or bladder disturbances, and denies abdominal pain, nausea or vomiting. He reports 1/10 intermittent mid-low back pain made worse with prolonged standing. The pain is the worst in his mid-low back and extends to his right flank. He is taking Neurontin 1-2 times per day with relief and reports he is not taking oxycodone. He reports occasional tingling in his lower legs but denies numbness. He reports chronic weakness in his legs requiring him to use a cane to ambulate, unchanged recently. He denies loss of bowel or bladder control.  A complete review of systems is obtained and is otherwise negative.    PHYSICAL EXAM:  Wt Readings from Last 3 Encounters:  03/24/18 210 lb 3.2 oz (95.3 kg)  03/11/18 219 lb 8 oz (99.6 kg)  03/03/18 221 lb 8 oz (100.5 kg)   Temp Readings from Last 3 Encounters:  03/24/18 98.9 F (37.2 C) (Oral)  03/11/18 98.7 F (37.1 C) (Oral)  03/03/18 100.1 F (37.8 C) (Oral)   BP Readings from Last 3 Encounters:  03/24/18 (!) 143/93  03/11/18 (!) 155/93  03/03/18 113/64   Pulse Readings from Last 3 Encounters:  03/24/18 (!) 105  03/11/18 95  03/03/18 (!) 122   Pain Assessment Pain Score: 1  Pain Frequency: Intermittent Pain Loc: Back/10  In general this is a well appearing African-American male in no acute distress. He is alert and oriented x4 and appropriate throughout the examination. HEENT reveals that the patient is normocephalic, atraumatic. EOMs are intact. PERRLA. Skin is intact without  any evidence of gross lesions. Cardiovascular exam reveals a regular rate and rhythm, no clicks rubs or murmurs are auscultated. Chest is clear to auscultation bilaterally. Lymphatic assessment is performed and does not reveal any adenopathy in the cervical, supraclavicular, axillary, or inguinal chains. Abdomen has active bowel sounds in all quadrants and is intact. The abdomen is soft, non tender, non distended. Lower extremities are negative for pretibial pitting edema, deep calf tenderness, cyanosis or clubbing.   KPS = 70  100 - Normal; no complaints; no evidence of disease. 90   - Able to carry on normal activity; minor signs or symptoms of disease. 80   - Normal activity with effort; some signs or symptoms of disease. 81   - Cares for self; unable to carry on normal activity or to do active work. 60   - Requires occasional assistance, but is able to care for most of his personal needs. 50   - Requires considerable assistance and frequent medical care. 57   - Disabled; requires special care and assistance. 33   - Severely disabled; hospital admission is indicated although death not imminent. 66   - Very sick; hospital admission necessary; active supportive treatment necessary. 10   - Moribund; fatal processes progressing rapidly. 0     - Dead  Karnofsky DA, Abelmann [place], Craver  LS and Burchenal JH (1948) The use of the nitrogen mustards in the palliative treatment of carcinoma: with particular reference to bronchogenic carcinoma Cancer 1 634-56  LABORATORY DATA:  Lab Results  Component Value Date   WBC 9.6 02/26/2018   HGB 10.3 (L) 02/26/2018   HCT 33.0 (L) 02/26/2018   MCV 86.6 02/26/2018   PLT 245 02/26/2018   Lab Results  Component Value Date   NA 138 03/01/2018   K 3.6 03/01/2018   CL 103 03/01/2018   CO2 25 03/01/2018   Lab Results  Component Value Date   ALT 9 03/01/2018   AST 16 03/01/2018   ALKPHOS 88 03/01/2018   BILITOT 0.3 03/01/2018     RADIOGRAPHY: Ct  Chest W Contrast  Result Date: 02/27/2018 CLINICAL DATA:  History of sarcoma. Evaluate for metastatic disease. EXAM: CT CHEST WITH CONTRAST TECHNIQUE: Multidetector CT imaging of the chest was performed during intravenous contrast administration. CONTRAST:  79m OMNIPAQUE IOHEXOL 300 MG/ML  SOLN COMPARISON:  CT chest 03/11/2016 FINDINGS: Cardiovascular: Normal heart size. Aortic atherosclerosis. Calcification in the left main, lad and left circumflex coronary arteries. Mediastinum/Nodes: Normal appearance of the thyroid gland. The trachea appears patent and is midline. No enlarged axillary, supraclavicular, mediastinal or hilar adenopathy. Lungs/Pleura: Small bilateral pleural effusions identified. Subsegmental atelectasis is noted in the lingula and both lower lobes. No airspace consolidation or pneumothorax identified. No suspicious pulmonary nodules. Upper Abdomen: No acute abnormality identified. Musculoskeletal: Mixed lytic and sclerotic bone metastasis is identified involving the T2 vertebra, image 108/9. T9 and T10 metastasis are also identified. Sclerotic lesion within the left seventh rib is identified, image number 94/5. IMPRESSION: 1. No suspicious pulmonary nodules identified 2. Small bilateral pleural effusions. 3. Mixed lytic and sclerotic bone metastasis involving the T2 vertebra, T9 and T10 vertebra. See report from MRI dated 02/27/2018. 4. Sclerotic lesion within the left seventh rib is identified. Suspicious for metastasis. 5. Multi vessel coronary artery atherosclerotic calcifications. Aortic Atherosclerosis (ICD10-I70.0). Electronically Signed   By: TKerby MoorsM.D.   On: 02/27/2018 23:10   Mr Thoracic Spine W Wo Contrast  Result Date: 02/27/2018 CLINICAL DATA:  He does occasionally have nausea and the pain is 10 out of 10 but does not seem to be exacerbated by food. It does seem to be worse at night when he lays down and when he sits up in the morning. EXAM: MRI THORACIC AND LUMBAR  SPINE WITHOUT AND WITH CONTRAST TECHNIQUE: Multiplanar and multiecho pulse sequences of the thoracic and lumbar spine were obtained without and with intravenous contrast. CONTRAST:  285mMULTIHANCE GADOBENATE DIMEGLUMINE 529 MG/ML IV SOLN COMPARISON:  None. FINDINGS: MRI THORACIC SPINE FINDINGS Alignment:  Physiologic. Vertebrae: Vertebral body heights are maintained. No acute fracture or static listhesis. 13 mm enhancing bone lesion in the left posterior T2 vertebral body. Heterogeneous marrow edema and T1 hypointensity throughout the T10 vertebral body with heterogeneous enhancement. Small bone lesion in the posterior T5 vertebral body. Small 8 mm bone lesion in the T9 vertebral body. Small Schmorl's node along the superior endplate of T1D98ith surrounding marrow edema without significant enhancement as can be seen with painful Schmorl's node. Cord:  Normal signal and morphology. Paraspinal and other soft tissues: No acute paraspinal abnormality. Disc levels: Disc spaces: Disc spaces are maintained T1-T2: No disc protrusion, foraminal stenosis or central canal stenosis. T2-T3: No disc protrusion, foraminal stenosis or central canal stenosis. T3-T4: No disc protrusion, foraminal stenosis or central canal stenosis. T4-T5: No disc protrusion, foraminal stenosis  or central canal stenosis. T5-T6: No disc protrusion, foraminal stenosis or central canal stenosis. T6-T7: No disc protrusion, foraminal stenosis or central canal stenosis. T7-T8: No disc protrusion, foraminal stenosis or central canal stenosis. T8-T9: No disc protrusion, foraminal stenosis or central canal stenosis. T9-T10: No disc protrusion, foraminal stenosis or central canal stenosis. T10-T11: No disc protrusion, foraminal stenosis or central canal stenosis. T11-T12: No disc protrusion, foraminal stenosis or central canal stenosis. MRI LUMBAR SPINE FINDINGS Segmentation:  Standard. Alignment:  Physiologic. Vertebrae: No acute fracture. No discitis. Small  bone lesion in the posterior L3 vertebral body. Small bone lesion in the L1 vertebral body. Small focal bone lesions in the L4 vertebral body. Conus medullaris: Extends to the T12 level and appears normal. Abnormal dural enhancement extending from approximately T7 through T11 most significant at T9 through T11 which may be reactive versus secondary to tumor infiltration. Paraspinal and other soft tissues: No acute paraspinal abnormality. Disc levels: Disc spaces: Degenerative disc disease with disc height loss at L4-5 with reactive endplate changes. T12-L1: No significant disc bulge. No evidence of neural foraminal stenosis. No central canal stenosis. L1-L2: No significant disc bulge. No evidence of neural foraminal stenosis. No central canal stenosis. L2-L3: No significant disc bulge. No evidence of neural foraminal stenosis. No central canal stenosis. Mild left facet arthropathy. L3-L4: Broad-based disc bulge. Mild bilateral facet arthropathy. Moderate spinal stenosis. No evidence of neural foraminal stenosis. L4-L5: Broad-based disc bulge eccentric towards the left. Mild bilateral facet arthropathy with a small left facet effusion. Severe left foraminal stenosis. Mild right foraminal stenosis. L5-S1: Mild broad-based disc bulge. Mild bilateral facet arthropathy. Mild bilateral foraminal stenosis. No evidence of neural foraminal stenosis. No central canal stenosis. IMPRESSION: THORACIC SPINE 1. Abnormal bone lesions in the T2, T5, T9 and T10 vertebral bodies most concerning for metastatic disease versus multiple myeloma. Abnormal dural enhancement extending from approximately T7 through T11 most significant at T9 through T11 which may be reactive versus secondary to tumor infiltration. 2. Small Schmorl's node along the superior endplate of V20 with surrounding marrow edema without significant enhancement as can be seen with painful Schmorl's node. LUMBAR SPINE 1. Abnormal bone lesions involving the L1, L3 and L4  vertebral bodies most concerning for metastatic disease versus multiple myeloma. 2. At L5-S1 there is a mild broad-based disc bulge. Mild bilateral facet arthropathy. Mild bilateral foraminal stenosis. Electronically Signed   By: Kathreen Devoid   On: 02/27/2018 11:40   Mr Lumbar Spine W Wo Contrast  Result Date: 02/27/2018 CLINICAL DATA:  He does occasionally have nausea and the pain is 10 out of 10 but does not seem to be exacerbated by food. It does seem to be worse at night when he lays down and when he sits up in the morning. EXAM: MRI THORACIC AND LUMBAR SPINE WITHOUT AND WITH CONTRAST TECHNIQUE: Multiplanar and multiecho pulse sequences of the thoracic and lumbar spine were obtained without and with intravenous contrast. CONTRAST:  82m MULTIHANCE GADOBENATE DIMEGLUMINE 529 MG/ML IV SOLN COMPARISON:  None. FINDINGS: MRI THORACIC SPINE FINDINGS Alignment:  Physiologic. Vertebrae: Vertebral body heights are maintained. No acute fracture or static listhesis. 13 mm enhancing bone lesion in the left posterior T2 vertebral body. Heterogeneous marrow edema and T1 hypointensity throughout the T10 vertebral body with heterogeneous enhancement. Small bone lesion in the posterior T5 vertebral body. Small 8 mm bone lesion in the T9 vertebral body. Small Schmorl's node along the superior endplate of TE33with surrounding marrow edema without significant enhancement as  can be seen with painful Schmorl's node. Cord:  Normal signal and morphology. Paraspinal and other soft tissues: No acute paraspinal abnormality. Disc levels: Disc spaces: Disc spaces are maintained T1-T2: No disc protrusion, foraminal stenosis or central canal stenosis. T2-T3: No disc protrusion, foraminal stenosis or central canal stenosis. T3-T4: No disc protrusion, foraminal stenosis or central canal stenosis. T4-T5: No disc protrusion, foraminal stenosis or central canal stenosis. T5-T6: No disc protrusion, foraminal stenosis or central canal stenosis.  T6-T7: No disc protrusion, foraminal stenosis or central canal stenosis. T7-T8: No disc protrusion, foraminal stenosis or central canal stenosis. T8-T9: No disc protrusion, foraminal stenosis or central canal stenosis. T9-T10: No disc protrusion, foraminal stenosis or central canal stenosis. T10-T11: No disc protrusion, foraminal stenosis or central canal stenosis. T11-T12: No disc protrusion, foraminal stenosis or central canal stenosis. MRI LUMBAR SPINE FINDINGS Segmentation:  Standard. Alignment:  Physiologic. Vertebrae: No acute fracture. No discitis. Small bone lesion in the posterior L3 vertebral body. Small bone lesion in the L1 vertebral body. Small focal bone lesions in the L4 vertebral body. Conus medullaris: Extends to the T12 level and appears normal. Abnormal dural enhancement extending from approximately T7 through T11 most significant at T9 through T11 which may be reactive versus secondary to tumor infiltration. Paraspinal and other soft tissues: No acute paraspinal abnormality. Disc levels: Disc spaces: Degenerative disc disease with disc height loss at L4-5 with reactive endplate changes. T12-L1: No significant disc bulge. No evidence of neural foraminal stenosis. No central canal stenosis. L1-L2: No significant disc bulge. No evidence of neural foraminal stenosis. No central canal stenosis. L2-L3: No significant disc bulge. No evidence of neural foraminal stenosis. No central canal stenosis. Mild left facet arthropathy. L3-L4: Broad-based disc bulge. Mild bilateral facet arthropathy. Moderate spinal stenosis. No evidence of neural foraminal stenosis. L4-L5: Broad-based disc bulge eccentric towards the left. Mild bilateral facet arthropathy with a small left facet effusion. Severe left foraminal stenosis. Mild right foraminal stenosis. L5-S1: Mild broad-based disc bulge. Mild bilateral facet arthropathy. Mild bilateral foraminal stenosis. No evidence of neural foraminal stenosis. No central canal  stenosis. IMPRESSION: THORACIC SPINE 1. Abnormal bone lesions in the T2, T5, T9 and T10 vertebral bodies most concerning for metastatic disease versus multiple myeloma. Abnormal dural enhancement extending from approximately T7 through T11 most significant at T9 through T11 which may be reactive versus secondary to tumor infiltration. 2. Small Schmorl's node along the superior endplate of Z61 with surrounding marrow edema without significant enhancement as can be seen with painful Schmorl's node. LUMBAR SPINE 1. Abnormal bone lesions involving the L1, L3 and L4 vertebral bodies most concerning for metastatic disease versus multiple myeloma. 2. At L5-S1 there is a mild broad-based disc bulge. Mild bilateral facet arthropathy. Mild bilateral foraminal stenosis. Electronically Signed   By: Kathreen Devoid   On: 02/27/2018 11:40   Nm Hepatobiliary Liver Func  Result Date: 02/27/2018 CLINICAL DATA:  Cholelithiasis.  Right flank pain. EXAM: NUCLEAR MEDICINE HEPATOBILIARY IMAGING TECHNIQUE: Sequential images of the abdomen were obtained out to 60 minutes following intravenous administration of radiopharmaceutical. RADIOPHARMACEUTICALS:  5.28 mCi Tc-29m Choletec IV COMPARISON:  None. FINDINGS: Prompt uptake and biliary excretion of activity by the liver is seen. Gallbladder activity is visualized, consistent with patency of cystic duct. Biliary activity passes into small bowel, consistent with patent common bile duct. IMPRESSION: The gallbladder fills normally on today's study. No abnormalities identified. Electronically Signed   By: DDorise BullionIII M.D   On: 02/27/2018 17:37   Nm  Bone Scan Whole Body  Result Date: 03/01/2018 CLINICAL DATA:  20.7 mci 84mcmdp iv/ ho prostate ca with lupron tx. Quarterly per patient/ no recent PSA available. New onset LBP with left flank pain/ no trauma/ eval for mets to bone EXAM: NUCLEAR MEDICINE WHOLE BODY BONE SCAN TECHNIQUE: Whole body anterior and posterior images were  obtained approximately 3 hours after intravenous injection of radiopharmaceutical. RADIOPHARMACEUTICALS:  Twenty mCi Technetium-959mDP IV COMPARISON:  Bone scan, 09/16/2016 FINDINGS: There are new areas of abnormal radiotracer localization when compared to the prior study. There is focal uptake in the upper thoracic spine corresponding to T2 with abnormal uptake in the lower thoracic spine corresponding to T10 and T11. This is most intense involving the T10 vertebra. There is focal abnormal uptake involving the posterior left sixth rib. There is a small focal area of abnormal uptake along the superior right iliac crest. There is a focus of uptake projecting along the inferomedial left orbit. Uptake is seen involving the sternoclavicular joints, shoulders, knees and right foot that appears degenerative and relatively stable from the prior study. Renal uptake is symmetric. IMPRESSION: 1. There are new areas of abnormal radiotracer localization to bone as detailed above, involving the thoracic spine, left posterior sixth rib and superior right iliac crest, consistent with metastatic disease to bone. Another focal area of uptake projecting along the inferior, medial left orbit is more nonspecific and could be reactive or metastatic uptake. Electronically Signed   By: DaLajean Manes.D.   On: 03/01/2018 14:10   Ct Abdomen Pelvis W Contrast  Result Date: 02/26/2018 CLINICAL DATA:  Right upper quadrant abdomen pain for 1 week. EXAM: CT ABDOMEN AND PELVIS WITH CONTRAST TECHNIQUE: Multidetector CT imaging of the abdomen and pelvis was performed using the standard protocol following bolus administration of intravenous contrast. CONTRAST:  10067mMNIPAQUE IOHEXOL 300 MG/ML  SOLN COMPARISON:  March 17, 2013 FINDINGS: Lower chest: Mild dependent atelectasis of posterior lung bases are noted. The heart size is normal. Hepatobiliary: There is diffuse low density of the liver. No focal liver lesions identified. Gallstones  identified in the gallbladder. There is no inflammatory change around gallbladder. The biliary tree is normal. Pancreas: Unremarkable. No pancreatic ductal dilatation or surrounding inflammatory changes. Spleen: Normal in size without focal abnormality. Adrenals/Urinary Tract: The adrenal glands are normal. There are multiple cysts in bilateral kidneys. No hydronephrosis is identified bilaterally. The bladder is normal. Stomach/Bowel: Stomach is within normal limits. Appendix appears normal. No evidence of bowel wall thickening, distention, or inflammatory changes. Vascular/Lymphatic: Aortic atherosclerosis. No enlarged abdominal or pelvic lymph nodes. Reproductive: Prostate is unremarkable. Other: No abdominal wall hernia or abnormality. No abdominopelvic ascites. Musculoskeletal: There is 2.7 x 2.8 cm sclerotic lesion in the T10 vertebral body, new since the prior exam. Degenerative joint changes of the spine are noted. IMPRESSION: No acute abnormality identified in the abdomen and pelvis. 2.8 cm focal sclerotic lesion in the T10 vertebral body, new compared to prior CT of March 17, 2013. Clinical correlation regarding history of primary neoplasm is recommended as metastatic lesion is a differential possibility. Mild fatty infiltration of liver. Cholelithiasis without evidence of cholecystitis on CT. Cyst in both kidneys. Electronically Signed   By: WeiAbelardo DieselD.   On: 02/26/2018 16:53   Us [employer] Limited Ruq  Result Date: 02/26/2018 CLINICAL DATA:  Right upper quadrant abdomen pain for 1 week. EXAM: ULTRASOUND ABDOMEN LIMITED RIGHT UPPER QUADRANT COMPARISON:  None. FINDINGS: Gallbladder: No wall thickening visualized. Gallstone is noted. The  ultrasonographer reports mild sonographic Murphy sign. Common bile duct: Diameter: 4.7 mm Liver: No focal lesion identified. Within normal limits in parenchymal echogenicity. Portal vein is patent on color Doppler imaging with normal direction of blood flow towards  the liver. IMPRESSION: Gallstone identified in the gallbladder. The ultrasonographer reports mild sonographic Murphy sign. The findings are equivocal for acute cholecystitis. Clinical correlation is recommended. Electronically Signed   By: Abelardo Diesel M.D.   On: 02/26/2018 19:09      IMPRESSION/PLAN: 1. 62 y.o. 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  Today, we talked to the patient about the findings and work-up thus far.  We discussed the natural history of castration resistant metastatic prostate cancer and general treatment, highlighting the role of palliative radiotherapy in the management painful spine metastases.  We discussed the available radiation techniques, and focused on the details of logistics and delivery.  The recommendation is for 10 daily radiation treatments to the thoracic spine (T8-T12) delivered over the course of 2 weeks.  We reviewed the anticipated acute and late sequelae associated with radiation in this setting.  The patient was encouraged to ask questions that we answered to the best of our ability.  At the conclusion of our conversation, the patient elects to proceed with palliative radiotherapy to the thoracic spine (T8-T12) and is scheduled for CT simulation on Monday September 9th at 3:00 PM. He has freely signed written consent to proceed and a copy of this document has been placed in his chart.  He appears to have a good understanding of his disease and our treatment recommendations with the intent to palliate his symptoms and prevent potential cord compression with disease progression.  He is in agreement with the stated plan.    Nicholos Johns, PA-C    Tyler Pita, MD  [facility] Direct Dial: [phone]  Fax: [phone] [email or web address]  Skype  LinkedIn    Page Me   This document serves as a record of services personally performed by Tyler Pita, MD and  Freeman Caldron, PA-C. It was created on their behalf by Rae Lips, a trained medical scribe. The creation of this record is based on the scribe's personal observations and the providers' statements to them. This document has been checked and approved by the attending providers.

## 2018-03-24 NOTE — Progress Notes (Signed)
See progress note under physician encounter. 

## 2018-03-25 NOTE — Progress Notes (Signed)
Introduced myself to patient as the prostate nurse navigator and my role. He was diagnosed with advanced  prostate cancer in 2017 and started on Lupron. In August he was hospitalized with pain and found to have mets to the bone. He consulted with Dr. Alen Blew earlier this week and started on Zytiga. He consulted with Dr. Tammi Klippel today regarding radiation to the spine. He states that transportation is his main concern. I informed him that we do have a transportation service and I will ask someone to reach out to him. He states he does not have telephone service at this time. He is scheduled for CT simulation 9/09 at 3 pm and I will ask  Ginette Otto to meet with him.

## 2018-03-28 ENCOUNTER — Ambulatory Visit
Admission: RE | Admit: 2018-03-28 | Discharge: 2018-03-28 | Disposition: A | Discharge status: Home / Self Care | Payer: 59 | Source: Ambulatory Visit | Attending: Radiation Oncology | Admitting: Radiation Oncology

## 2018-03-28 ENCOUNTER — Encounter: Payer: Self-pay | Admitting: *Deleted

## 2018-03-28 ENCOUNTER — Telehealth: Payer: Self-pay | Admitting: Medical Oncology

## 2018-03-28 ENCOUNTER — Encounter: Payer: Self-pay | Admitting: Medical Oncology

## 2018-03-28 DIAGNOSIS — C61 Malignant neoplasm of prostate: Secondary | ICD-10-CM | POA: Diagnosis not present

## 2018-03-28 DIAGNOSIS — C7951 Secondary malignant neoplasm of bone: Secondary | ICD-10-CM | POA: Diagnosis not present

## 2018-03-28 DIAGNOSIS — Z510 Encounter for antineoplastic radiation therapy: Secondary | ICD-10-CM | POA: Insufficient documentation

## 2018-03-28 NOTE — Progress Notes (Signed)
  Radiation Oncology         [phone] ________________________________  Name: Jacob Joseph MRN: [id number]  Date: 03/28/2018  DOB: 1956/06/17  SIMULATION AND TREATMENT PLANNING NOTE    ICD-10-CM   1. Prostate cancer metastatic to multiple sites [facility]) C61   2. Spine metastasis (Lupus) C79.51     DIAGNOSIS:  62 y.o. 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  Identity was confirmed.  All relevant records and images related to the planned course of therapy were reviewed.  The patient freely provided informed written consent to proceed with treatment after reviewing the details related to the planned course of therapy. The consent form was witnessed and verified by the simulation staff.  Then, the patient was set-up in a stable reproducible  supine position for radiation therapy.  CT images were obtained.  Surface markings were placed.  The CT images were loaded into the planning software.  Then the target and avoidance structures were contoured.  Treatment planning then occurred.  The radiation prescription was entered and confirmed.  Then, I designed and supervised the construction of a total of 2 medically necessary complex treatment devices.  I have requested : 3D Simulation  I have requested a DVH of the following structures: left lung, right lung, heart, spinal cord and targets.  PLAN:  The patient will receive 30 Gy in 10 fractions.  ________________________________  Sheral Apley Tammi Klippel, M.D.  This document serves as a record of services personally performed by Tyler Pita, MD. It was created on his behalf by Wilburn Mylar, a trained medical scribe. The creation of this record is based on the scribe's personal observations and the provider's statements to them. This document has been checked and approved by the attending provider.

## 2018-03-28 NOTE — Telephone Encounter (Signed)
[name] called to remind me he is coming for CT simulation today and he needs to meet with the transportation coordinator. I informed him that I spoke with Ebony Hail our transportation coordinator on Friday and she will meet with him today.I will meet him in radiation oncology when he arrives today. He voiced understanding.

## 2018-03-28 NOTE — Progress Notes (Signed)
Jacob Joseph here for CT simulation. He met with Jacob Joseph regarding transportation. He voiced concerns about food in the home and I referred him to [place].  He will begin radiation treatments 9/16. I asked him to call me with questions or concerns. He voiced understanding.

## 2018-03-31 ENCOUNTER — Encounter: Payer: Medicare HMO | Admitting: Internal Medicine

## 2018-03-31 ENCOUNTER — Encounter: Payer: Self-pay | Admitting: Internal Medicine

## 2018-03-31 DIAGNOSIS — C7951 Secondary malignant neoplasm of bone: Secondary | ICD-10-CM | POA: Diagnosis not present

## 2018-03-31 NOTE — Progress Notes (Deleted)
   CC: ***  HPI:  Mr.Suhaas Clerk is a 62 y.o.   Past Medical History:  Diagnosis Date  . AKI (acute kidney injury) ([place]) 09/24/2016  . Calcium oxalate crystals in urine 12/23/2011   Asymptomatic, no hematuria. Advised to take plenty of water.  [name] Kitchen COPD (chronic obstructive pulmonary disease) ([place])   . Depression   . Diabetes mellitus without complication ([place]) 4481   pre diabetic  . Hepatitis C   . HIV (human immunodeficiency virus infection) ([place])   . Hypertension   . Peripheral arterial disease ([place])    ooccluded left SFA by duplex ultrasound, tibial disease left greater than right  . Prostate cancer metastatic to multiple sites [facility])   . Stroke ([place]) 11/2011   Carotids Doppler negative. Right sided weakness resolved, initially on Plavix for 3 months and then continued with ASA.    . TB lung, latent 1988   Treated   Review of Systems:  ***  Physical Exam:  There were no vitals filed for this visit. ***  Assessment & Plan:   See Encounters Tab for problem based charting.  Patient {GC/GE:3044014::"discussed with","seen with"} Dr. {NAMES:[id number]::"Butcher","Granfortuna","E. Hoffman","Klima","Mullen","Narendra","Raines","Vincent"}

## 2018-04-01 NOTE — Progress Notes (Signed)
CSW met with patient and spouse.  Jacob Joseph reported his only concern at this time is finances.  He receives SSDI and is only income for the household.  Patient is utilizing [place] transportation program, but does not have a phone.  CSW provided patient with food bag, stated they do not receive food stamps. CSW will make referral to financial advocates in radiation oncology.  CSW briefly discussed support services and programs available for patient and caregiver.  Patient's caregiver was tearful during visit. CSW encouraged patient/caregiver to follow up as needed.  Maryjean Morn, MSW, LCSW, OSW-C Clinical Social Worker [facility] [phone]

## 2018-04-04 ENCOUNTER — Ambulatory Visit
Admission: RE | Admit: 2018-04-04 | Discharge: 2018-04-04 | Disposition: A | Discharge status: Home / Self Care | Payer: 59 | Source: Ambulatory Visit | Attending: Radiation Oncology | Admitting: Radiation Oncology

## 2018-04-04 ENCOUNTER — Other Ambulatory Visit: Payer: Self-pay | Admitting: Internal Medicine

## 2018-04-04 ENCOUNTER — Encounter: Payer: Self-pay | Admitting: Medical Oncology

## 2018-04-04 ENCOUNTER — Other Ambulatory Visit: Payer: Self-pay | Admitting: Oncology

## 2018-04-04 DIAGNOSIS — M1990 Unspecified osteoarthritis, unspecified site: Secondary | ICD-10-CM

## 2018-04-04 DIAGNOSIS — C7951 Secondary malignant neoplasm of bone: Secondary | ICD-10-CM | POA: Diagnosis not present

## 2018-04-04 NOTE — Progress Notes (Signed)
[name] here for radiation and asked to speak with me. He states the transportation is working well but he is  concerned because he needs to get some medications refilled and he does not have the funds.  I asked Jodelle Green to speak with him and his wife to discuss patient assistance. Per Ailene Ravel she has asked them to bring his documents to support their finances.

## 2018-04-05 ENCOUNTER — Ambulatory Visit
Admission: RE | Admit: 2018-04-05 | Discharge: 2018-04-05 | Disposition: A | Discharge status: Home / Self Care | Payer: 59 | Source: Ambulatory Visit | Attending: Radiation Oncology | Admitting: Radiation Oncology

## 2018-04-05 DIAGNOSIS — C7951 Secondary malignant neoplasm of bone: Secondary | ICD-10-CM | POA: Diagnosis not present

## 2018-04-05 NOTE — Telephone Encounter (Signed)
Refill approved.

## 2018-04-06 ENCOUNTER — Ambulatory Visit
Admission: RE | Admit: 2018-04-06 | Discharge: 2018-04-06 | Disposition: A | Discharge status: Home / Self Care | Payer: 59 | Source: Ambulatory Visit | Attending: Radiation Oncology | Admitting: Radiation Oncology

## 2018-04-06 ENCOUNTER — Encounter: Payer: Self-pay | Admitting: *Deleted

## 2018-04-06 DIAGNOSIS — C7951 Secondary malignant neoplasm of bone: Secondary | ICD-10-CM | POA: Diagnosis not present

## 2018-04-07 ENCOUNTER — Ambulatory Visit
Admission: RE | Admit: 2018-04-07 | Discharge: 2018-04-07 | Disposition: A | Discharge status: Home / Self Care | Payer: 59 | Source: Ambulatory Visit | Attending: Radiation Oncology | Admitting: Radiation Oncology

## 2018-04-07 DIAGNOSIS — C7951 Secondary malignant neoplasm of bone: Secondary | ICD-10-CM | POA: Diagnosis not present

## 2018-04-08 ENCOUNTER — Ambulatory Visit
Admission: RE | Admit: 2018-04-08 | Discharge: 2018-04-08 | Disposition: A | Discharge status: Home / Self Care | Payer: 59 | Source: Ambulatory Visit | Attending: Radiation Oncology | Admitting: Radiation Oncology

## 2018-04-08 DIAGNOSIS — C7951 Secondary malignant neoplasm of bone: Secondary | ICD-10-CM | POA: Diagnosis not present

## 2018-04-11 ENCOUNTER — Ambulatory Visit
Admission: RE | Admit: 2018-04-11 | Discharge: 2018-04-11 | Disposition: A | Discharge status: Home / Self Care | Payer: 59 | Source: Ambulatory Visit | Attending: Radiation Oncology | Admitting: Radiation Oncology

## 2018-04-11 DIAGNOSIS — C7951 Secondary malignant neoplasm of bone: Secondary | ICD-10-CM | POA: Diagnosis not present

## 2018-04-12 ENCOUNTER — Inpatient Hospital Stay: Payer: 59

## 2018-04-12 ENCOUNTER — Telehealth: Payer: Self-pay | Admitting: Oncology

## 2018-04-12 ENCOUNTER — Ambulatory Visit
Admission: RE | Admit: 2018-04-12 | Discharge: 2018-04-12 | Disposition: A | Discharge status: Home / Self Care | Payer: 59 | Source: Ambulatory Visit | Attending: Radiation Oncology | Admitting: Radiation Oncology

## 2018-04-12 ENCOUNTER — Other Ambulatory Visit: Payer: Self-pay | Admitting: *Deleted

## 2018-04-12 ENCOUNTER — Inpatient Hospital Stay: Payer: 59 | Attending: Oncology | Admitting: Oncology

## 2018-04-12 VITALS — BP 146/96 | HR 90 | Temp 98.7°F | Resp 18 | Ht 72.0 in | Wt 210.4 lb

## 2018-04-12 DIAGNOSIS — E119 Type 2 diabetes mellitus without complications: Secondary | ICD-10-CM

## 2018-04-12 DIAGNOSIS — Z7901 Long term (current) use of anticoagulants: Secondary | ICD-10-CM | POA: Insufficient documentation

## 2018-04-12 DIAGNOSIS — C7951 Secondary malignant neoplasm of bone: Secondary | ICD-10-CM | POA: Diagnosis not present

## 2018-04-12 DIAGNOSIS — C61 Malignant neoplasm of prostate: Secondary | ICD-10-CM

## 2018-04-12 DIAGNOSIS — B20 Human immunodeficiency virus [HIV] disease: Secondary | ICD-10-CM | POA: Diagnosis not present

## 2018-04-12 DIAGNOSIS — Z79899 Other long term (current) drug therapy: Secondary | ICD-10-CM | POA: Diagnosis not present

## 2018-04-12 DIAGNOSIS — Z7982 Long term (current) use of aspirin: Secondary | ICD-10-CM | POA: Insufficient documentation

## 2018-04-12 DIAGNOSIS — Z7984 Long term (current) use of oral hypoglycemic drugs: Secondary | ICD-10-CM | POA: Diagnosis not present

## 2018-04-12 LAB — CMP (CANCER CENTER ONLY)
ALK PHOS: 127 U/L — AB (ref 38–126)
ALT: 8 U/L (ref 0–44)
ANION GAP: 10 (ref 5–15)
AST: 11 U/L — ABNORMAL LOW (ref 15–41)
Albumin: 3.6 g/dL (ref 3.5–5.0)
BUN: 12 mg/dL (ref 6–20)
CALCIUM: 9.7 mg/dL (ref 8.9–10.3)
CO2: 21 mmol/L — ABNORMAL LOW (ref 22–32)
Chloride: 109 mmol/L (ref 98–111)
Creatinine: 1.33 mg/dL — ABNORMAL HIGH (ref 0.61–1.24)
GFR, Estimated: 57 mL/min — ABNORMAL LOW (ref >60)
Glucose, Bld: 139 mg/dL — ABNORMAL HIGH (ref 70–99)
Potassium: 3.8 mmol/L (ref 3.5–5.1)
SODIUM: 140 mmol/L (ref 135–145)
TOTAL PROTEIN: 8.5 g/dL — AB (ref 6.5–8.1)
Total Bilirubin: 0.3 mg/dL (ref 0.3–1.2)

## 2018-04-12 LAB — CBC WITH DIFFERENTIAL (CANCER CENTER ONLY)
BASOS PCT: 1 %
Basophils Absolute: 0 10*3/uL (ref 0.0–0.1)
Eosinophils Absolute: 0.3 10*3/uL (ref 0.0–0.5)
Eosinophils Relative: 5 %
HCT: 35.1 % — ABNORMAL LOW (ref 38.4–49.9)
HEMOGLOBIN: 11.2 g/dL — AB (ref 13.0–17.1)
Lymphocytes Relative: 11 %
Lymphs Abs: 0.7 10*3/uL — ABNORMAL LOW (ref 0.9–3.3)
MCH: 27 pg — ABNORMAL LOW (ref 27.2–33.4)
MCHC: 32 g/dL (ref 32.0–36.0)
MCV: 84.6 fL (ref 79.3–98.0)
Monocytes Absolute: 0.4 10*3/uL (ref 0.1–0.9)
Monocytes Relative: 7 %
NEUTROS ABS: 4.9 10*3/uL (ref 1.5–6.5)
NEUTROS PCT: 76 %
Platelet Count: 190 10*3/uL (ref 140–400)
RBC: 4.15 MIL/uL — AB (ref 4.20–5.82)
RDW: 15.8 % — ABNORMAL HIGH (ref 11.0–14.6)
WBC Count: 6.3 10*3/uL (ref 4.0–10.3)

## 2018-04-12 MED ORDER — CALCIUM GLUCONATE 500 MG PO TABS: 1.0000 | ORAL_TABLET | Freq: Every day | ORAL | 3 refills | Status: DC

## 2018-04-12 MED ORDER — ABIRATERONE ACETATE 250 MG PO TABS: 1000.0000 mg | ORAL_TABLET | Freq: Every day | ORAL | 0 refills | Status: DC

## 2018-04-12 NOTE — Progress Notes (Signed)
Hematology and Oncology Follow Up Visit  Tryce Surratt [id number] 05-Nov-1955 62 y.o. 04/12/2018 3:26 PM Alvina Chou, [place], MD   Principle Diagnosis: 62 year old man with castration-resistant prostate cancer with disease to the bone documented in August 2019.  He was initially diagnosed with prostate cancer 2017 with a PSA of 23.1.   Prior Therapy: He was treated with Lupron under the care of [name] every 4 months since this time of diagnosis. He is status post radiation therapy for a total of 30 Gy in 10 fractions to be completed in September 2019 to the thoracic spine.  Current therapy: Zytiga 1000 mg daily with prednisone at 5 mg daily.   Interim History: Mr. Rothlisberger presents today for a follow-up visit.  Since her last visit, he started taking Zytiga and prednisone without any major complications.  He denies any nausea excessive fatigue or lower extremity edema.  He continues to receive radiation therapy to the thoracic spine without any issues.  He denies any esophageal irritation or dysphasia.  He continues to ambulate without any difficulties.  He denies any falls or syncope.  He denies any worsening bone pain.  He does not report any headaches, blurry vision, syncope or seizures. Does not report any fevers, chills or sweats.  Does not report any cough, wheezing or hemoptysis.  Does not report any chest pain, palpitation, orthopnea or leg edema.  Does not report any nausea, vomiting or abdominal pain.  Does not report any constipation or diarrhea.  Does not report any skeletal complaints.    Does not report frequency, urgency or hematuria.  Does not report any skin rashes or lesions. Does not report any heat or cold intolerance.  Does not report any lymphadenopathy or petechiae.  Does not report any anxiety or depression.  Remaining review of systems is negative.    Medications: I have reviewed the patient's current medications.  Current Outpatient Medications   Medication Sig Dispense Refill  . abiraterone acetate (ZYTIGA) 250 MG tablet Take 4 tablets (1,000 mg total) by mouth daily. Take on an empty stomach 1 hour before or 2 hours after a meal 120 tablet 0  . albuterol (PROAIR HFA) 108 (90 Base) MCG/ACT inhaler INHALE 2 PUFFS INTO THE LUNGS EVERY 6 HOURS AS NEEDED FOR WHEEZING OR SHORTNESS OF BREATH, TO NOT USE EVERY DAY, USE ONLY IF NEEDED 8.5 g 3  . apixaban (ELIQUIS) 2.5 MG TABS tablet Take 1 tablet (2.5 mg total) by mouth 2 (two) times daily. Starting 8/21 60 tablet 1  . apixaban (ELIQUIS) 5 MG TABS tablet Take 1 tablet (5 mg total) by mouth 2 (two) times daily for 7 days. THEN 14 tablet 0  . aspirin 81 MG EC tablet Take 1 tablet (81 mg total) by mouth daily. 30 tablet 5  . azelastine (ASTELIN) 0.1 % nasal spray Place 1 spray into both nostrils 2 (two) times daily as needed for rhinitis. 30 mL 1  . Blood Glucose Monitoring Suppl (ONETOUCH VERIO) w/Device KIT 1 Device by Does not apply route 2 (two) times daily. ICD 10  E11.8, non-insulin dependent, test up to twice daily 1 kit 0  . Budesonide 90 MCG/ACT inhaler Inhale 1-2 puffs into the lungs 2 (two) times daily. 1 Inhaler 3  . cholecalciferol (VITAMIN D) 1000 units tablet Take 1 tablet (1,000 Units total) by mouth daily. 90 tablet 3  . citalopram (CELEXA) 20 MG tablet Take 20 mg by mouth daily.    . diclofenac sodium (VOLTAREN) 1 % GEL  APPLY 4 GRAMS EXTERNALLY TO THE AFFECTED AREA FOUR TIMES DAILY AS NEEDED FOR PAIN 100 g 0  . gabapentin (NEURONTIN) 300 MG capsule Take 2 capsules (600 mg total) by mouth 2 (two) times daily. 120 capsule 2  . GENVOYA 150-150-200-10 MG TABS tablet TAKE 1 TABLET BY MOUTH DAILY WITH BREAKFAST (Patient taking differently: Take 1 tablet by mouth daily. ) 30 tablet 4  . glucose blood (ONETOUCH VERIO) test strip Use as instructed ICD 10  E11.8, non-insulin dependent, test up to twice daily 100 each 11  . hydroxypropyl methylcellulose / hypromellose (ISOPTO TEARS / GONIOVISC)  2.5 % ophthalmic solution Place 1 drop into both eyes as needed for dry eyes.    . hydrOXYzine (VISTARIL) 50 MG capsule Take 50 mg by mouth at bedtime.     . metFORMIN (GLUCOPHAGE) 500 MG tablet Take 1 tablet (500 mg total) by mouth daily with breakfast. 30 tablet 0  . ONETOUCH DELICA LANCETS FINE MISC 1 Stick by Does not apply route 2 (two) times daily. ICD 10  E11.8, non-insulin dependent, test up to twice daily 100 each 11  . oxyCODONE 10 MG TABS Take 1 tablet (10 mg total) by mouth every 6 (six) hours as needed for severe pain or breakthrough pain. 50 tablet 0  . polyethylene glycol (MIRALAX / GLYCOLAX) packet Take 17 g by mouth daily. 14 each 0  . pravastatin (PRAVACHOL) 40 MG tablet Take 1 tablet (40 mg total) by mouth daily. 90 tablet 1  . predniSONE (DELTASONE) 5 MG tablet TAKE 1 TABLET(5 MG) BY MOUTH DAILY WITH BREAKFAST 90 tablet 0  . PREZISTA 800 MG tablet TAKE 1 TABLET BY MOUTH EVERY DAY WITH BREAKFAST (Patient taking differently: Take 800 mg by mouth daily. ) 30 tablet 4  . QUEtiapine (SEROQUEL) 300 MG tablet Take 300 mg by mouth 2 (two) times daily.     . tamsulosin (FLOMAX) 0.4 MG CAPS capsule TAKE 1 CAPSULE(0.4 MG) BY MOUTH DAILY AFTER BREAKFAST (Patient taking differently: Take 0.4 mg by mouth daily. TAKE 1 CAPSULE(0.4 MG) BY MOUTH DAILY AFTER BREAKFAST) 6 capsule 0  . Tiotropium Bromide-Olodaterol (STIOLTO RESPIMAT) 2.5-2.5 MCG/ACT AERS Inhale 2 puffs into the lungs daily. 3 Inhaler 3  . valACYclovir (VALTREX) 500 MG tablet TAKE 1 TABLET BY MOUTH DAILY (Patient taking differently: Take 500 mg by mouth daily. ) 90 tablet 2   No current facility-administered medications for this visit.      Allergies:  Allergies  Allergen Reactions  . Statins Other (See Comments)    Elevated liver enzymes.    Past Medical History, Surgical history, Social history, and Family History were reviewed and updated.   Physical Exam: Blood pressure (!) 146/96, pulse 90, temperature 98.7 F (37.1  C), temperature source Oral, resp. rate 18, height 6' (1.829 m), weight 210 lb 6.4 oz (95.4 kg), SpO2 100 %.   ECOG: 0 General appearance: alert and cooperative appeared without distress. Head: Normocephalic, without obvious abnormality Oropharynx: No oral thrush or ulcers. Eyes: No scleral icterus.  Pupils are equal and round reactive to light. Lymph nodes: Cervical, supraclavicular, and axillary nodes normal. Heart:regular rate and rhythm, S1, S2 normal, no murmur, click, rub or gallop Lung:chest clear, no wheezing, rales, normal symmetric air entry Abdomin: soft, non-tender, without masses or organomegaly. Neurological: No motor, sensory deficits.  Intact deep tendon reflexes. Skin: No rashes or lesions.  No ecchymosis or petechiae. Musculoskeletal: No joint deformity or effusion. Psychiatric: Mood and affect are appropriate.    Lab Results:  Lab Results  Component Value Date   WBC 9.6 02/26/2018   HGB 10.3 (L) 02/26/2018   HCT 33.0 (L) 02/26/2018   MCV 86.6 02/26/2018   PLT 245 02/26/2018     Chemistry      Component Value Date/Time   NA 138 03/01/2018 0511   NA 140 01/27/2016 1527   K 3.6 03/01/2018 0511   CL 103 03/01/2018 0511   CO2 25 03/01/2018 0511   BUN 8 03/01/2018 0511   BUN 14 01/27/2016 1527   CREATININE 1.31 (H) 03/01/2018 0511   CREATININE 1.51 (H) 11/04/2017 1129      Component Value Date/Time   CALCIUM 8.9 03/01/2018 0511   ALKPHOS 88 03/01/2018 0511   AST 16 03/01/2018 0511   ALT 9 03/01/2018 0511   BILITOT 0.3 03/01/2018 0511          Impression and Plan:  62 year old man with the:  1.    Castration-resistant prostate cancer initially diagnosed in 2018 but developed advanced disease in 2019 with high-volume bone disease.  He started taking Zytiga and prednisone in the last 4 weeks without any major complications.  The natural course of his disease as well as risks and benefits associated with this therapy long-term was reviewed.  He  has no objections continuing this medication I will continue to monitor his PSA periodically.  His electrolytes as well as liver function test will be monitored as well.   2.  Androgen deprivation: He continues to receive this under the care of [name] he is scheduled to have his next injection in November 2019..  3.  Thoracic spine metastasis: He is currently receiving radiation therapy without any complications at this time.  No pain noted.  4.  Bone directed therapy: The rationale and risks and benefits of Delton See was reviewed today.  Long-term complications including osteonecrosis of the jaw, hypocalcemia among others were reviewed.  He is already on vitamin D supplement but I will start him on calcium supplement at this time.  I will also obtain dental clearance before proceeding with this treatment which will start tentatively in November 2019.  5.  Prognosis and goals of care: Therapy remains palliative although his performance status is adequate.  6.  HIV: Continues to follow with Dr. Megan Salon regarding this issue.  7.  Follow-up: We will be in 2 months to follow his progress.  25  minutes was spent with the patient face-to-face today.  More than 50% of time was dedicated to patient counseling, education and coordination of his care.      Zola Button, MD 9/24/20193:26 PM

## 2018-04-12 NOTE — Telephone Encounter (Signed)
Appts scheduled AVS/Calendar printed per 9/24 los °

## 2018-04-13 ENCOUNTER — Telehealth: Payer: Self-pay

## 2018-04-13 ENCOUNTER — Telehealth: Payer: Self-pay | Admitting: *Deleted

## 2018-04-13 ENCOUNTER — Ambulatory Visit
Admission: RE | Admit: 2018-04-13 | Discharge: 2018-04-13 | Disposition: A | Discharge status: Home / Self Care | Payer: 59 | Source: Ambulatory Visit | Attending: Radiation Oncology | Admitting: Radiation Oncology

## 2018-04-13 DIAGNOSIS — C7951 Secondary malignant neoplasm of bone: Secondary | ICD-10-CM | POA: Diagnosis not present

## 2018-04-13 LAB — PROSTATE-SPECIFIC AG, SERUM (LABCORP): Prostate Specific Ag, Serum: 4.7 ng/mL — ABNORMAL HIGH (ref 0.0–4.0)

## 2018-04-13 LAB — TESTOSTERONE: Testosterone: <3 ng/dL — ABNORMAL LOW (ref 264–916)

## 2018-04-13 MED FILL — ABIRATERONE ACETATE 250 MG: 250 | 30 days supply | Qty: 120 | Fill #0

## 2018-04-13 NOTE — Telephone Encounter (Signed)
Per 9/24 Sharyne Peach working on schedule

## 2018-04-13 NOTE — Telephone Encounter (Signed)
-----   Message from [name], MD sent at 04/13/2018  8:48 AM EDT ----- Please let him know his PSA is down.

## 2018-04-13 NOTE — Telephone Encounter (Signed)
Left results of last PSA on patient identified voice mail.

## 2018-04-13 NOTE — Telephone Encounter (Signed)
Left message on patient identified voice mail.

## 2018-04-14 ENCOUNTER — Ambulatory Visit
Admission: RE | Admit: 2018-04-14 | Discharge: 2018-04-14 | Disposition: A | Discharge status: Home / Self Care | Payer: 59 | Source: Ambulatory Visit | Attending: Radiation Oncology | Admitting: Radiation Oncology

## 2018-04-14 DIAGNOSIS — C7951 Secondary malignant neoplasm of bone: Secondary | ICD-10-CM | POA: Diagnosis not present

## 2018-04-15 ENCOUNTER — Ambulatory Visit
Admission: RE | Admit: 2018-04-15 | Discharge: 2018-04-15 | Disposition: A | Discharge status: Home / Self Care | Payer: 59 | Source: Ambulatory Visit | Attending: Radiation Oncology | Admitting: Radiation Oncology

## 2018-04-15 ENCOUNTER — Telehealth: Payer: Self-pay | Admitting: Oncology

## 2018-04-15 DIAGNOSIS — C7951 Secondary malignant neoplasm of bone: Secondary | ICD-10-CM | POA: Diagnosis not present

## 2018-04-15 NOTE — Telephone Encounter (Signed)
LMVM for [facility] to call patient to scheduled appt per 9/24 llos

## 2018-04-18 ENCOUNTER — Other Ambulatory Visit: Payer: Self-pay | Admitting: Radiation Oncology

## 2018-04-18 ENCOUNTER — Encounter: Payer: Self-pay | Admitting: Radiation Oncology

## 2018-04-18 MED ORDER — SUCRALFATE 1 G PO TABS: 1.0000 g | ORAL_TABLET | Freq: Four times a day (QID) | ORAL | 2 refills | Status: DC

## 2018-04-18 MED FILL — SUCRALFATE 1 GM TABLET: 1 | 30 days supply | Qty: 120 | Fill #0

## 2018-04-18 NOTE — Progress Notes (Signed)
  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  He was able to ambulate with a cane. He denied retention or incontinence of bowel or bladder, paresthesias in BLE, swallowing issues, skin issues, and issues with appetite throughout treatment. He reported improving back pain on 5 mg steroid daily and throat discomfort at times.   Plan: The patient has completed radiation treatment. The patient will return to radiation oncology clinic for routine followup in one month. I advised him to call or return sooner if he has any questions or concerns related to his recovery or treatment. ________________________________  Sheral Apley. Tammi Klippel, M.D.  This document serves as a record of services personally performed by Tyler Pita, MD. It was created on his behalf by Wilburn Mylar, a trained medical scribe. The creation of this record is based on the scribe's personal observations and the provider's statements to them. This document has been checked and approved by the attending provider.

## 2018-04-22 ENCOUNTER — Telehealth (HOSPITAL_COMMUNITY): Payer: Self-pay

## 2018-04-22 ENCOUNTER — Other Ambulatory Visit (HOSPITAL_COMMUNITY): Payer: 59 | Admitting: Dentistry

## 2018-04-25 NOTE — Addendum Note (Signed)
Addended by: Neva Seat B on: 04/25/2018 01:57 PM   Modules accepted: Orders

## 2018-04-28 ENCOUNTER — Other Ambulatory Visit: Payer: Self-pay | Admitting: Internal Medicine

## 2018-04-28 DIAGNOSIS — M1990 Unspecified osteoarthritis, unspecified site: Secondary | ICD-10-CM

## 2018-04-28 NOTE — Telephone Encounter (Signed)
Refill approved.

## 2018-04-29 ENCOUNTER — Encounter: Payer: Self-pay | Admitting: Podiatry

## 2018-04-29 ENCOUNTER — Ambulatory Visit (INDEPENDENT_AMBULATORY_CARE_PROVIDER_SITE_OTHER): Payer: 59 | Admitting: Podiatry

## 2018-04-29 DIAGNOSIS — L853 Xerosis cutis: Secondary | ICD-10-CM

## 2018-04-29 DIAGNOSIS — M79674 Pain in right toe(s): Secondary | ICD-10-CM | POA: Diagnosis not present

## 2018-04-29 DIAGNOSIS — E1151 Type 2 diabetes mellitus with diabetic peripheral angiopathy without gangrene: Secondary | ICD-10-CM

## 2018-04-29 DIAGNOSIS — B351 Tinea unguium: Secondary | ICD-10-CM | POA: Diagnosis not present

## 2018-04-29 DIAGNOSIS — M79675 Pain in left toe(s): Secondary | ICD-10-CM | POA: Diagnosis not present

## 2018-04-29 NOTE — Patient Instructions (Signed)
Smoking Tobacco Information Smoking tobacco will very likely harm your health. Tobacco contains a poisonous (toxic), colorless chemical called nicotine. Nicotine affects the brain and makes tobacco addictive. This change in your brain can make it hard to stop smoking. Tobacco also has other toxic chemicals that can hurt your body and raise your risk of many cancers. How can smoking tobacco affect me? Smoking tobacco can increase your chances of having serious health conditions, such as:  Cancer. Smoking is most commonly associated with lung cancer, but can lead to cancer in other parts of the body.  Chronic obstructive pulmonary disease (COPD). This is a long-term lung condition that makes it hard to breathe. It also gets worse over time.  High blood pressure (hypertension), heart disease, stroke, or heart attack.  Lung infections, such as pneumonia.  Cataracts. This is when the lenses in the eyes become clouded.  Digestive problems. This may include peptic ulcers, heartburn, and gastroesophageal reflux disease (GERD).  Oral health problems, such as gum disease and tooth loss.  Loss of taste and smell.  Smoking can affect your appearance by causing:  Wrinkles.  Yellow or stained teeth, fingers, and fingernails.  Smoking tobacco can also affect your social life.  Many workplaces, restaurants, hotels, and public places are tobacco-free. This means that you may experience challenges in finding places to smoke when away from home.  The cost of a smoking habit can be expensive. Expenses for someone who smokes come in two ways: ? You spend money on a regular basis to buy tobacco. ? Your health care costs in the long-term are higher if you smoke.  Tobacco smoke can also affect the health of those around you. Children of smokers have greater chances of: ? Sudden infant death syndrome (SIDS). ? Ear infections. ? Lung infections.  What lifestyle changes can be made?  Do not start  smoking. Quit if you already do.  To quit smoking: ? Make a plan to quit smoking and commit yourself to it. Look for programs to help you and ask your health care provider for recommendations and ideas. ? Talk with your health care provider about using nicotine replacement medicines to help you quit. Medicine replacement medicines include gum, lozenges, patches, sprays, or pills. ? Do not replace cigarette smoking with electronic cigarettes, which are commonly called e-cigarettes. The safety of e-cigarettes is not known, and some may contain harmful chemicals. ? Avoid places, people, or situations that tempt you to smoke. ? If you try to quit but return to smoking, don't give up hope. It is very common for people to try a number of times before they fully succeed. When you feel ready again, give it another try.  Quitting smoking might affect the way you eat as well as your weight. Be prepared to monitor your eating habits. Get support in planning and following a healthy diet.  Ask your health care provider about having regular tests (screenings) to check for cancer. This may include blood tests, imaging tests, and other tests.  Exercise regularly. Consider taking walks, joining a gym, or doing yoga or exercise classes.  Develop skills to manage your stress. These skills include meditation. What are the benefits of quitting smoking? By quitting smoking, you may:  Lower your risk of getting cancer and other diseases caused by smoking.  Live longer.  Breathe better.  Lower your blood pressure and heart rate.  Stop your addiction to tobacco.  Stop creating secondhand smoke that hurts other people.  Improve your   sense of taste and smell.  Look better over time, due to having fewer wrinkles and less staining.  What can happen if changes are not made? If you do not stop smoking, you may:  Get cancer and other diseases.  Develop COPD or other long-term (chronic) lung  conditions.  Develop serious problems with your heart and blood vessels (cardiovascular system).  Need more tests to screen for problems caused by smoking.  Have higher, long-term healthcare costs from medicines or treatments related to smoking.  Continue to have worsening changes in your lungs, mouth, and nose.  Where to find support: To get support to quit smoking, consider:  Asking your health care provider for more information and resources.  Taking classes to learn more about quitting smoking.  Looking for local organizations that offer resources about quitting smoking.  Joining a support group for people who want to quit smoking in your local community.  Where to find more information: You may find more information about quitting smoking from:  HelpGuide.org: www.helpguide.org/articles/addictions/how-to-quit-smoking.htm  https://hall.com/: smokefree.gov  American Lung Association: www.lung.org  Contact a health care provider if:  You have problems breathing.  Your lips, nose, or fingers turn blue.  You have chest pain.  You are coughing up blood.  You feel faint or you pass out.  You have other noticeable changes that cause you to worry. Summary  Smoking tobacco can negatively affect your health, the health of those around you, your finances, and your social life.  Do not start smoking. Quit if you already do. If you need help quitting, ask your health care provider.  Think about joining a support group for people who want to quit smoking in your local community. There are many effective programs that will help you to quit this behavior. This information is not intended to replace advice given to you by your health care provider. Make sure you discuss any questions you have with your health care provider. Document Released: 07/21/2016 Document Revised: 07/21/2016 Document Reviewed: 07/21/2016 Elsevier Interactive Patient Education  2018 [employer]. Diabetes  and Foot Care Diabetes may cause you to have problems because of poor blood supply (circulation) to your feet and legs. This may cause the skin on your feet to become thinner, break easier, and heal more slowly. Your skin may become dry, and the skin may peel and crack. You may also have nerve damage in your legs and feet causing decreased feeling in them. You may not notice minor injuries to your feet that could lead to infections or more serious problems. Taking care of your feet is one of the most important things you can do for yourself. Follow these instructions at home:  Wear shoes at all times, even in the house. Do not go barefoot. Bare feet are easily injured.  Check your feet daily for blisters, cuts, and redness. If you cannot see the bottom of your feet, use a mirror or ask someone for help.  Wash your feet with warm water (do not use hot water) and mild soap. Then pat your feet and the areas between your toes until they are completely dry. Do not soak your feet as this can dry your skin.  Apply a moisturizing lotion or petroleum jelly (that does not contain alcohol and is unscented) to the skin on your feet and to dry, brittle toenails. Do not apply lotion between your toes.  Trim your toenails straight across. Do not dig under them or around the cuticle. File the edges of  your nails with an emery board or nail file.  Do not cut corns or calluses or try to remove them with medicine.  Wear clean socks or stockings every day. Make sure they are not too tight. Do not wear knee-high stockings since they may decrease blood flow to your legs.  Wear shoes that fit properly and have enough cushioning. To break in new shoes, wear them for just a few hours a day. This prevents you from injuring your feet. Always look in your shoes before you put them on to be sure there are no objects inside.  Do not cross your legs. This may decrease the blood flow to your feet.  If you find a minor scrape,  cut, or break in the skin on your feet, keep it and the skin around it clean and dry. These areas may be cleansed with mild soap and water. Do not cleanse the area with peroxide, alcohol, or iodine.  When you remove an adhesive bandage, be sure not to damage the skin around it.  If you have a wound, look at it several times a day to make sure it is healing.  Do not use heating pads or hot water bottles. They may burn your skin. If you have lost feeling in your feet or legs, you may not know it is happening until it is too late.  Make sure your health care provider performs a complete foot exam at least annually or more often if you have foot problems. Report any cuts, sores, or bruises to your health care provider immediately. Contact a health care provider if:  You have an injury that is not healing.  You have cuts or breaks in the skin.  You have an ingrown nail.  You notice redness on your legs or feet.  You feel burning or tingling in your legs or feet.  You have pain or cramps in your legs and feet.  Your legs or feet are numb.  Your feet always feel cold. Get help right away if:  There is increasing redness, swelling, or pain in or around a wound.  There is a red line that goes up your leg.  Pus is coming from a wound.  You develop a fever or as directed by your health care provider.  You notice a bad smell coming from an ulcer or wound. This information is not intended to replace advice given to you by your health care provider. Make sure you discuss any questions you have with your health care provider. Document Released: 07/03/2000 Document Revised: 12/12/2015 Document Reviewed: 12/13/2012 Elsevier Interactive Patient Education  2017 [employer].

## 2018-04-30 NOTE — Progress Notes (Signed)
Subjective: Jacob Joseph presents today for at-risk foot care. He denies any foot wounds or new pedal symptoms.  He has h/o tobacco abuse, multiple CVAs, peripheral arterial disease, diabetes, HIV+ and cc of painful, discolored, thick toenails which interfere with daily activities and routine tasks. Pain is aggravated when wearing enclosed shoe gear. Pain is relieved with periodic professional debridement.   He continues to smoke daily saying he is thinking about quitting.  He states he has not been moisturizing his feet due to him not understanding the correct way to do it.  He was last seen by Vascular, Dr. Gwenlyn Found,  in 2016.   Allergies     StatinsOther (See Comments)     Objective: Vascular Examination: Capillary refill time <3 seconds x 10 digits Dorsalis pedis pulses absent b/l Posterior tibial pulses absent left and 2/4 right  No digital hair x 10 digits Skin temperature b/l No signs of ischemia noted b/l  Dermatological Examination: No open wounds No interdigital macerations noted b/l Skin is dry and flaky b/l No skin fissures b/l Toenails 1-5 b/l discolored, thick, dystrophic with subungual debris and pain with palpation to nailbeds due to thickness of nails.  Musculoskeletal: Muscle strength 5/5 to all LE muscle groups HAV with bunion b/l  Neurological: Sensation diminished with 10 gram monofilament b/l  Last ABIs by Cardiology, May 2016 Per review of chart, his last ABI was on 11/30/2014 with: occluded left SFA and one-vessel runoff on that side Left ABI: 0.52 Right ABI: 1.0  Assessment: 1. Painful onychomycosis toenails 1-5 b/l 2. NIDDM w/Peripheral arterial disease with no tissue loss/ischemia 3. Neuropathy 4. HIV+  Plan: 1. Toenails 1-5 b/l were debrided in length and girth without iatrogenic bleeding. 2. We discussed moisturizing his feet. Since winter is coming, he is to moisturize his feet twice daily: once in the morning, once at night avoiding  applying lotion between the toes.He is to dry between his toes well after bathing/showering. 3. We discussed diabetes and the effects on the microvascular system related to strokes, kidney failure, blindness and limb loss. We discussed  importance of smoking cessation. He was given literature on diabetic foot care and smoking. He will think strongly about quitting. 4. Patient to continue soft, supportive shoe gear 5. Patient to report any pedal injuries to medical professional  6. Follow up 3 months.  7. Patient/POA to call should there be a concern in the interim.

## 2018-05-06 ENCOUNTER — Telehealth: Payer: Self-pay

## 2018-05-06 NOTE — Telephone Encounter (Signed)
Received message from patient stating that he has not heard about the appointment for the dentist referral. This Jacob Joseph attempted to contact patient x3 regarding dental follow up. Contacted [facility] and confirmed that patient is established with agency. Communicated the necessary follow up appointment for dental clearance related to [facility] therapy and faxed referral to [phone]

## 2018-05-11 ENCOUNTER — Other Ambulatory Visit: Payer: Self-pay | Admitting: Oncology

## 2018-05-11 DIAGNOSIS — C61 Malignant neoplasm of prostate: Secondary | ICD-10-CM

## 2018-05-12 ENCOUNTER — Emergency Department (HOSPITAL_COMMUNITY): Payer: 59

## 2018-05-12 ENCOUNTER — Emergency Department (HOSPITAL_COMMUNITY)
Admission: EM | Admit: 2018-05-12 | Discharge: 2018-05-12 | Disposition: A | Discharge status: Home / Self Care | Payer: 59 | Attending: Emergency Medicine | Admitting: Emergency Medicine

## 2018-05-12 ENCOUNTER — Other Ambulatory Visit: Payer: Self-pay

## 2018-05-12 ENCOUNTER — Encounter (HOSPITAL_COMMUNITY): Payer: Self-pay

## 2018-05-12 DIAGNOSIS — S42202A Unspecified fracture of upper end of left humerus, initial encounter for closed fracture: Secondary | ICD-10-CM

## 2018-05-12 DIAGNOSIS — Y9289 Other specified places as the place of occurrence of the external cause: Secondary | ICD-10-CM | POA: Insufficient documentation

## 2018-05-12 DIAGNOSIS — I10 Essential (primary) hypertension: Secondary | ICD-10-CM | POA: Diagnosis not present

## 2018-05-12 DIAGNOSIS — Y998 Other external cause status: Secondary | ICD-10-CM | POA: Diagnosis not present

## 2018-05-12 DIAGNOSIS — Y9301 Activity, walking, marching and hiking: Secondary | ICD-10-CM | POA: Insufficient documentation

## 2018-05-12 DIAGNOSIS — B20 Human immunodeficiency virus [HIV] disease: Secondary | ICD-10-CM | POA: Insufficient documentation

## 2018-05-12 DIAGNOSIS — S4992XA Unspecified injury of left shoulder and upper arm, initial encounter: Secondary | ICD-10-CM | POA: Diagnosis present

## 2018-05-12 DIAGNOSIS — W010XXA Fall on same level from slipping, tripping and stumbling without subsequent striking against object, initial encounter: Secondary | ICD-10-CM | POA: Diagnosis not present

## 2018-05-12 DIAGNOSIS — M25422 Effusion, left elbow: Secondary | ICD-10-CM

## 2018-05-12 DIAGNOSIS — E119 Type 2 diabetes mellitus without complications: Secondary | ICD-10-CM | POA: Insufficient documentation

## 2018-05-12 MED ORDER — HYDROMORPHONE HCL 1 MG/ML IJ SOLN
1.0000 mg | Freq: Once | INTRAMUSCULAR | Status: AC
  Administered 2018-05-12: 1 mg via INTRAMUSCULAR
  Filled 2018-05-12: qty 1

## 2018-05-12 MED ORDER — HYDROCODONE-ACETAMINOPHEN 5-325 MG PO TABS
2.0000 | ORAL_TABLET | Freq: Once | ORAL | Status: AC
  Administered 2018-05-12: 2 via ORAL
  Filled 2018-05-12: qty 2

## 2018-05-12 MED ORDER — HYDROCODONE-ACETAMINOPHEN 5-325 MG PO TABS: 1.0000 | ORAL_TABLET | Freq: Four times a day (QID) | ORAL | 0 refills | Status: DC | PRN

## 2018-05-12 MED FILL — ABIRATERONE ACETATE 250 MG: 250 | 30 days supply | Qty: 120 | Fill #0

## 2018-05-12 NOTE — ED Provider Notes (Signed)
[facility] DEPT Provider Note   CSN: [id number] Arrival date & time: 05/12/18  1459     History   Chief Complaint Chief Complaint  Patient presents with  . Fall  . Shoulder Injury    HPI Jacob Joseph is a 62 y.o. male.  HPI Patient presents to the emergency room with complaints of left shoulder pain.  Patient states he was walking when he tripped on something on the ground.  He landed on his left shoulder.  Patient denies hitting his head or having any loss of consciousness.  Patient states his primary pain is in his shoulder but he also does have some pain in his left elbow and left forearm.  He denies any numbness or weakness.  No vomiting or diarrhea.  The pain in his shoulder is severe and increases with any movement. Past Medical History:  Diagnosis Date  . AKI (acute kidney injury) ([place]) 09/24/2016  . Calcium oxalate crystals in urine 12/23/2011   Asymptomatic, no hematuria. Advised to take plenty of water.  [name] Kitchen COPD (chronic obstructive pulmonary disease) ([place])   . Depression   . Diabetes mellitus without complication ([place]) 2585   pre diabetic  . Hepatitis C   . HIV (human immunodeficiency virus infection) ([place])   . Hypertension   . PE (pulmonary thromboembolism) ([place]) 05/12/2016   PE in 2017, on chronic anticoagulation for this and recurrent DVT.  [name] Kitchen Peripheral arterial disease ([place])    ooccluded left SFA by duplex ultrasound, tibial disease left greater than right  . Prostate cancer metastatic to multiple sites [facility])   . Stroke ([place]) 11/2011   Carotids Doppler negative. Right sided weakness resolved, initially on Plavix for 3 months and then continued with ASA.    . TB lung, latent 1988   Treated    Patient Active Problem List   Diagnosis Date Noted  . Spine metastasis ([place]) 03/24/2018  . Acute deep vein thrombosis (DVT) of right popliteal vein ([place])   . Prostate cancer metastatic to multiple sites [facility])   . Radicular pain of  thoracic region   . Bone lesion   . Calculus of gallbladder without cholecystitis without obstruction   . Chronic anticoagulation   . Right upper quadrant pain 02/26/2018  . Abnormal computed tomography of thoracic spine 02/26/2018  . Recurrent falls 11/02/2016  . History of prostate cancer 09/22/2016  . BPH associated with nocturia 06/19/2016  . Substance abuse ([place]) 01/27/2016  . Vitamin D deficiency 12/05/2015  . Hyperlipidemia 01/28/2015  . Cataracts, bilateral 08/29/2014  . Diabetes mellitus treated with oral medication ([place]) 10/19/2013  . COPD, moderate ([place]) 05/31/2013  . Complex Lt Renal Cyst 03/22/2013  . Preventive measure 05/30/2012  . History of CVA 03/08/2012  . Normocytic anemia 03/08/2012  . CKD (chronic kidney disease) stage 3, GFR 30-59 ml/min ([facility]) 03/08/2012  . ERECTILE DYSFUNCTION 04/25/2008  . HIV disease ([place]) 04/27/2006  . Smoking greater than 30 pack years 04/27/2006  . Major depressive disorder, recurrent episode ([place]) 04/27/2006  . History of hypertension 04/27/2006  . DEGENERATIVE JOINT DISEASE 04/27/2006  . Chronic Low Back Pain 04/27/2006    Past Surgical History:  Procedure Laterality Date  . HERNIA REPAIR    . INGUINAL HERNIA REPAIR  01/16/2012   Procedure: HERNIA REPAIR INGUINAL INCARCERATED;  Surgeon: Zenovia Jarred, MD;  Location: [facility];  Service: General;  Laterality: Right;  . TEE WITHOUT CARDIOVERSION  12/07/2011   Procedure: TRANSESOPHAGEAL ECHOCARDIOGRAM (TEE);  Surgeon: Birdie Riddle,  MD;  Location: [facility];  Service: Cardiovascular;  Laterality: N/A;        Home Medications    Prior to Admission medications   Medication Sig Start Date End Date Taking? Authorizing Provider  abiraterone acetate (ZYTIGA) 250 MG tablet TAKE 4 TABLETS (1,000 MG TOTAL) BY MOUTH DAILY. TAKE ON AN EMPTY STOMACH 1 HOUR BEFORE OR 2 HOURS AFTER A MEAL 05/11/18   [name], MD  albuterol (PROAIR HFA) 108 (90 Base) MCG/ACT inhaler INHALE 2 PUFFS  INTO THE LUNGS EVERY 6 HOURS AS NEEDED FOR WHEEZING OR SHORTNESS OF BREATH, TO NOT USE EVERY DAY, USE ONLY IF NEEDED 01/07/17   Axel Filler, MD  apixaban (ELIQUIS) 2.5 MG TABS tablet Take 1 tablet (2.5 mg total) by mouth 2 (two) times daily. 04/28/18   Neva Seat, MD  aspirin 81 MG EC tablet Take 1 tablet (81 mg total) by mouth daily. 01/27/16   Burgess Estelle, MD  azelastine (ASTELIN) 0.1 % nasal spray Place 1 spray into both nostrils 2 (two) times daily as needed for rhinitis. 03/16/18   Neva Seat, MD  Blood Glucose Monitoring Suppl (ONETOUCH VERIO) w/Device KIT 1 Device by Does not apply route 2 (two) times daily. ICD 10  E11.8, non-insulin dependent, test up to twice daily 09/30/16   Burgess Estelle, MD  Budesonide 90 MCG/ACT inhaler Inhale 1-2 puffs into the lungs 2 (two) times daily. 09/30/16   Oval Linsey, MD  calcium gluconate 500 MG tablet Take 1 tablet (500 mg total) by mouth daily. 04/12/18   [name], MD  cholecalciferol (VITAMIN D) 1000 units tablet Take 1 tablet (1,000 Units total) by mouth daily. 01/27/16   Burgess Estelle, MD  citalopram (CELEXA) 20 MG tablet Take 20 mg by mouth daily. 10/25/14   [provider]  diclofenac sodium (VOLTAREN) 1 % GEL APPLY 4 GRAMS EXTERNALLY TO THE AFFECTED AREA FOUR TIMES DAILY AS NEEDED FOR PAIN 04/28/18   Neva Seat, MD  gabapentin (NEURONTIN) 300 MG capsule Take 2 capsules (600 mg total) by mouth 2 (two) times daily. 03/10/18 04/09/18  Neva Seat, MD  GENVOYA 150-150-200-10 MG TABS tablet TAKE 1 TABLET BY MOUTH DAILY WITH BREAKFAST Patient taking differently: Take 1 tablet by mouth daily.  01/25/18   Michel Bickers, MD  glucose blood [facility] VERIO) test strip Use as instructed ICD 10  E11.8, non-insulin dependent, test up to twice daily 09/30/16   Burgess Estelle, MD  HYDROcodone-acetaminophen (NORCO/VICODIN) 5-325 MG tablet Take 1 tablet by mouth every 6 (six) hours as needed. 05/12/18   Dorie Rank, MD    hydroxypropyl methylcellulose / hypromellose (ISOPTO TEARS / GONIOVISC) 2.5 % ophthalmic solution Place 1 drop into both eyes as needed for dry eyes.    [provider]  hydrOXYzine (VISTARIL) 50 MG capsule Take 50 mg by mouth at bedtime.  01/21/18   [provider]  metFORMIN (GLUCOPHAGE) 500 MG tablet Take 1 tablet (500 mg total) by mouth daily with breakfast. 03/04/18   Molt, Bethany, DO  ONETOUCH DELICA LANCETS FINE MISC 1 Stick by Does not apply route 2 (two) times daily. ICD 10  E11.8, non-insulin dependent, test up to twice daily 09/30/16   Burgess Estelle, MD  oxyCODONE 10 MG TABS Take 1 tablet (10 mg total) by mouth every 6 (six) hours as needed for severe pain or breakthrough pain. 03/01/18   Molt, Bethany, DO  polyethylene glycol (MIRALAX / GLYCOLAX) packet Take 17 g by mouth daily. 03/02/18   Molt,  Bethany, DO  pravastatin (PRAVACHOL) 40 MG tablet Take 1 tablet (40 mg total) by mouth daily. 01/12/18   Neva Seat, MD  predniSONE (DELTASONE) 5 MG tablet TAKE 1 TABLET(5 MG) BY MOUTH DAILY WITH BREAKFAST 04/05/18   Shadad, Mathis Dad, MD  PREZISTA 800 MG tablet TAKE 1 TABLET BY MOUTH EVERY DAY WITH BREAKFAST Patient taking differently: Take 800 mg by mouth daily.  01/25/18   Michel Bickers, MD  QUEtiapine (SEROQUEL) 300 MG tablet Take 300 mg by mouth 2 (two) times daily.  11/11/15   [provider]  sucralfate (CARAFATE) 1 g tablet Take 1 tablet (1 g total) by mouth 4 (four) times daily. 04/18/18   Kyung Rudd, MD  tamsulosin (FLOMAX) 0.4 MG CAPS capsule TAKE 1 CAPSULE(0.4 MG) BY MOUTH DAILY AFTER BREAKFAST Patient taking differently: Take 0.4 mg by mouth daily. TAKE 1 CAPSULE(0.4 MG) BY MOUTH DAILY AFTER BREAKFAST 01/25/18   Neva Seat, MD  Tiotropium Bromide-Olodaterol (STIOLTO RESPIMAT) 2.5-2.5 MCG/ACT AERS Inhale 2 puffs into the lungs daily. 12/27/17   Neva Seat, MD  valACYclovir (VALTREX) 500 MG tablet TAKE 1 TABLET BY MOUTH DAILY Patient taking  differently: Take 500 mg by mouth daily.  12/15/17   Michel Bickers, MD    Family History Family History  Problem Relation Age of Onset  . Hypertension Mother   . Heart attack Mother   . Stroke Mother   . Hypertension Father   . Cancer Father     Social History Social History   Tobacco Use  . Smoking status: Current Every Day Smoker    Packs/day: 0.45    Types: Cigarettes    Last attempt to quit: 11/18/2014    Years since quitting: 3.4  . Smokeless tobacco: Never Used  . Tobacco comment: .5 PPD  Substance Use Topics  . Alcohol use: No    Alcohol/week: 0.0 standard drinks  . Drug use: Yes    Frequency: 3.0 times per week    Types: "Crack" cocaine, Cocaine     Allergies   Statins   Review of Systems Review of Systems  All other systems reviewed and are negative.    Physical Exam Updated Vital Signs BP (!) 153/97   Pulse 86   Temp 98.5 F (36.9 C) (Oral)   Resp 16   Ht 1.829 m (6')   Wt 99.8 kg   SpO2 97%   BMI 29.84 kg/m   Physical Exam  Constitutional: He appears well-developed and well-nourished. No distress.  HENT:  Head: Normocephalic and atraumatic.  Right Ear: External ear normal.  Left Ear: External ear normal.  Eyes: Conjunctivae are normal. Right eye exhibits no discharge. Left eye exhibits no discharge. No scleral icterus.  Neck: Neck supple. No tracheal deviation present.  Cardiovascular: Normal rate, regular rhythm and intact distal pulses.  Pulmonary/Chest: Effort normal and breath sounds normal. No stridor. No respiratory distress. He has no wheezes. He has no rales.  Abdominal: Soft. Bowel sounds are normal. He exhibits no distension. There is no tenderness. There is no rebound and no guarding.  Musculoskeletal: He exhibits no edema.       Left shoulder: He exhibits tenderness, deformity and pain. He exhibits no swelling.       Left elbow: He exhibits decreased range of motion.       Right hip: Normal.       Left hip: Normal.        Cervical back: Normal.       Thoracic back: Normal.  Lumbar back: Normal.       Left forearm: He exhibits tenderness.  Neurological: He is alert. He has normal strength. No cranial nerve deficit (no facial droop, extraocular movements intact, no slurred speech) or sensory deficit. He exhibits normal muscle tone. He displays no seizure activity. Coordination normal.  Skin: Skin is warm and dry. No rash noted.  Psychiatric: He has a normal mood and affect.  Nursing note and vitals reviewed.    ED Treatments / Results  Labs (all labs ordered are listed, but only abnormal results are displayed) Labs Reviewed - No data to display  EKG None  Radiology Dg Elbow Complete Left  Result Date: 05/12/2018 CLINICAL DATA:  Pain after fall EXAM: LEFT ELBOW - COMPLETE 3+ VIEW COMPARISON:  None. FINDINGS: There is a joint effusion in the left elbow with displaced in the fat pad and anterior fat pad. This suggests an underlying fracture. While no fracture is seen with certainty, a subtle proximal radius fracture would be most likely. IMPRESSION: Left elbow joint effusion. This strongly suggests an underlying occult fracture. While no definitive fractures are noted, a proximal radius fracture is most likely and there may be a subtle lucency in the radial neck. Electronically Signed   By: Dorise Bullion III M.D   On: 05/12/2018 18:53   Dg Forearm Left  Result Date: 05/12/2018 CLINICAL DATA:  Pain after trauma. EXAM: LEFT FOREARM - 2 VIEW COMPARISON:  None. FINDINGS: There is no evidence of fracture or other focal bone lesions. Soft tissues are unremarkable. IMPRESSION: Negative. Electronically Signed   By: Dorise Bullion III M.D   On: 05/12/2018 18:50   Dg Shoulder Left  Result Date: 05/12/2018 CLINICAL DATA:  Left shoulder injury EXAM: LEFT SHOULDER - 2+ VIEW COMPARISON:  None. FINDINGS: Acute, comminuted and slightly impacted fracture involving the left humeral neck. No humeral head dislocation.  No significant angulation. Small displaced bone fragments outer aspect of the proximal humerus. IMPRESSION: Acute comminuted and slightly impacted appearing proximal left humerus fracture. Electronically Signed   By: Donavan Foil M.D.   On: 05/12/2018 16:48    Procedures Procedures (including critical care time)  Medications Ordered in ED Medications  HYDROmorphone (DILAUDID) injection 1 mg (1 mg Intramuscular Given 05/12/18 1755)  HYDROcodone-acetaminophen (NORCO/VICODIN) 5-325 MG per tablet 2 tablet (2 tablets Oral Given 05/12/18 1936)     Initial Impression / Assessment and Plan / ED Course  I have reviewed the triage vital signs and the nursing notes.  Pertinent labs & imaging results that were available during my care of the patient were reviewed by me and considered in my medical decision making (see chart for details).  Clinical Course as of May 12 1953  Thu May 12, 2018  1934 X-rays reviewed.  Proximal humerus fracture noted.  Possible occult radial head fracture   [JK]    Clinical Course User Index [JK] Dorie Rank, MD    She presented to the emergency room for evaluation after a fall.  Patient's injuries seem to be isolated to his left upper extremity.  X-rays show a proximal humerus fracture.  He also has evidence of possible occult radial head fracture.  Patient was placed in a sling and splint.  I will have him follow-up with orthopedics as an outpatient.  Final Clinical Impressions(s) / ED Diagnoses   Final diagnoses:  Closed fracture of proximal end of left humerus, unspecified fracture morphology, initial encounter  Effusion of left elbow    ED Discharge Orders  Ordered    HYDROcodone-acetaminophen (NORCO/VICODIN) 5-325 MG tablet  Every 6 hours PRN     05/12/18 1953           Dorie Rank, MD 05/12/18 1954

## 2018-05-12 NOTE — ED Triage Notes (Signed)
Per EMS- Patient reported that he tripped ona flower bed border, landing on his left shoulder. Patient is on blood thinners, but denies having LOC or a head injury.

## 2018-05-12 NOTE — Discharge Instructions (Addendum)
Apply ice to help with the swelling, use the sling for comfort, follow up with an orthopedic doctor for further evaluation,

## 2018-05-17 ENCOUNTER — Encounter: Payer: Self-pay | Admitting: *Deleted

## 2018-05-17 ENCOUNTER — Ambulatory Visit
Admission: RE | Admit: 2018-05-17 | Discharge: 2018-05-17 | Disposition: A | Discharge status: Home / Self Care | Payer: 59 | Source: Ambulatory Visit | Attending: Urology | Admitting: Urology

## 2018-05-17 DIAGNOSIS — C7951 Secondary malignant neoplasm of bone: Secondary | ICD-10-CM

## 2018-05-23 LAB — HM DIABETES EYE EXAM

## 2018-05-24 ENCOUNTER — Telehealth: Payer: Self-pay | Admitting: Oncology

## 2018-05-24 ENCOUNTER — Encounter: Payer: Self-pay | Admitting: *Deleted

## 2018-05-24 LAB — HM DIABETES EYE EXAM

## 2018-05-24 NOTE — Telephone Encounter (Signed)
shadad call day 11/21 moved lab/fu to 11/27. Left message and mailed schedule.

## 2018-05-25 ENCOUNTER — Other Ambulatory Visit: Payer: Self-pay | Admitting: Internal Medicine

## 2018-05-25 DIAGNOSIS — M1990 Unspecified osteoarthritis, unspecified site: Secondary | ICD-10-CM

## 2018-05-25 NOTE — Telephone Encounter (Signed)
Refills approved.

## 2018-05-26 ENCOUNTER — Ambulatory Visit (INDEPENDENT_AMBULATORY_CARE_PROVIDER_SITE_OTHER): Payer: 59 | Admitting: Internal Medicine

## 2018-05-26 ENCOUNTER — Encounter: Payer: Self-pay | Admitting: Internal Medicine

## 2018-05-26 ENCOUNTER — Other Ambulatory Visit: Payer: Self-pay

## 2018-05-26 VITALS — BP 156/85 | HR 115 | Temp 98.8°F | Ht 72.0 in | Wt 209.9 lb

## 2018-05-26 DIAGNOSIS — S42202G Unspecified fracture of upper end of left humerus, subsequent encounter for fracture with delayed healing: Secondary | ICD-10-CM

## 2018-05-26 DIAGNOSIS — E1122 Type 2 diabetes mellitus with diabetic chronic kidney disease: Secondary | ICD-10-CM | POA: Diagnosis not present

## 2018-05-26 DIAGNOSIS — Z7984 Long term (current) use of oral hypoglycemic drugs: Secondary | ICD-10-CM | POA: Diagnosis not present

## 2018-05-26 DIAGNOSIS — I129 Hypertensive chronic kidney disease with stage 1 through stage 4 chronic kidney disease, or unspecified chronic kidney disease: Secondary | ICD-10-CM

## 2018-05-26 DIAGNOSIS — S42295G Other nondisplaced fracture of upper end of left humerus, subsequent encounter for fracture with delayed healing: Secondary | ICD-10-CM

## 2018-05-26 DIAGNOSIS — W19XXXD Unspecified fall, subsequent encounter: Secondary | ICD-10-CM

## 2018-05-26 DIAGNOSIS — Z8679 Personal history of other diseases of the circulatory system: Secondary | ICD-10-CM

## 2018-05-26 DIAGNOSIS — E119 Type 2 diabetes mellitus without complications: Secondary | ICD-10-CM

## 2018-05-26 DIAGNOSIS — I82431 Acute embolism and thrombosis of right popliteal vein: Secondary | ICD-10-CM

## 2018-05-26 DIAGNOSIS — R296 Repeated falls: Secondary | ICD-10-CM

## 2018-05-26 DIAGNOSIS — Z9181 History of falling: Secondary | ICD-10-CM

## 2018-05-26 DIAGNOSIS — I10 Essential (primary) hypertension: Secondary | ICD-10-CM

## 2018-05-26 DIAGNOSIS — N183 Chronic kidney disease, stage 3 (moderate): Secondary | ICD-10-CM

## 2018-05-26 DIAGNOSIS — Z79899 Other long term (current) drug therapy: Secondary | ICD-10-CM

## 2018-05-26 HISTORY — DX: Unspecified fracture of upper end of left humerus, subsequent encounter for fracture with delayed healing: S42.202G

## 2018-05-26 LAB — POCT GLYCOSYLATED HEMOGLOBIN (HGB A1C): Hemoglobin A1C: 6 % — AB (ref 4.0–5.6)

## 2018-05-26 LAB — GLUCOSE, CAPILLARY: Glucose-Capillary: 200 mg/dL — ABNORMAL HIGH (ref 70–99)

## 2018-05-26 MED ORDER — LISINOPRIL 2.5 MG PO TABS: 2.5000 mg | ORAL_TABLET | Freq: Every day | ORAL | 1 refills | Status: DC

## 2018-05-26 MED ORDER — HYDROCODONE-ACETAMINOPHEN 5-325 MG PO TABS: 1.0000 | ORAL_TABLET | Freq: Two times a day (BID) | ORAL | 0 refills | Status: AC | PRN

## 2018-05-26 NOTE — Patient Instructions (Addendum)
Thank you for allowing [place] to care for you  You diabetes remains well controlled today  Your blood pressure is elevated again and we will start low dose medication - Start Lisinopril 2.5mg  Daily  For you left arm fracture - Referral to orthopedics placed - 10 day supply of pain medication provided  For falls - Referral to PT ordered - Make sure to get up and move slowly if your feeling lightheaded  Return for follow up in 3 months

## 2018-05-26 NOTE — Progress Notes (Signed)
   CC: Diabetes, Hypertension, CKD III, Recurrent Falls, Left Humerus Fracture  HPI:   [name] is a 62 y.o. M with PMHx listed below presenting for Diabetes, Hypertension, CKD III, Recurrent Falls, Left Humerus Fracture. Please see the A&P for the status of the patient's chronic medical problems.   Past Medical History:  Diagnosis Date  . AKI (acute kidney injury) ([place]) 09/24/2016  . Calcium oxalate crystals in urine 12/23/2011   Asymptomatic, no hematuria. Advised to take plenty of water.  [name] Kitchen COPD (chronic obstructive pulmonary disease) ([place])   . Depression   . Diabetes mellitus without complication ([place]) 7619   pre diabetic  . Hepatitis C   . HIV (human immunodeficiency virus infection) ([place])   . Hypertension   . PE (pulmonary thromboembolism) ([place]) 05/12/2016   PE in 2017, on chronic anticoagulation for this and recurrent DVT.  [name] Kitchen Peripheral arterial disease ([place])    ooccluded left SFA by duplex ultrasound, tibial disease left greater than right  . Prostate cancer metastatic to multiple sites [facility])   . Stroke ([place]) 11/2011   Carotids Doppler negative. Right sided weakness resolved, initially on Plavix for 3 months and then continued with ASA.    . TB lung, latent 1988   Treated   Review of Systems:  Performed and all others negative.   Physical Exam:  Vitals:   05/26/18 1343  BP: (!) 156/85  Pulse: (!) 115  Temp: 98.8 F (37.1 C)  TempSrc: Oral  SpO2: 99%  Weight: 209 lb 14.4 oz (95.2 kg)  Height: 6' (1.829 m)   Physical Exam  Constitutional: He appears well-developed and well-nourished. No distress.  Cardiovascular: Normal rate, regular rhythm, normal heart sounds and intact distal pulses.  Pulse check on exam improved to ~100 BPM  Pulmonary/Chest: Effort normal and breath sounds normal. No respiratory distress.  Abdominal: Soft. Bowel sounds are normal. He exhibits no distension. There is no tenderness.  Musculoskeletal: He exhibits no edema.  LUE splinted  and in sling RLE strength 4-5/5 LLE strength 5/5 Sensation grossly intact Bilat LEs  Skin: Skin is warm and dry.     Assessment & Plan:   See Encounters Tab for problem based charting.  Patient discussed with Dr. Daryll Drown

## 2018-05-26 NOTE — Assessment & Plan Note (Addendum)
Hgb A1c today6.0; This is stable from previousA1cof 6.1. Will maintain current regimen. Will check Urine Microalbumin. Unable to obtain urine sample for urine Microalbumin/Cr. Will try again at follow up. - Metformin 500mg  Daily - Patient states he has had eye exam done

## 2018-05-27 ENCOUNTER — Encounter: Payer: Self-pay | Admitting: Internal Medicine

## 2018-05-27 NOTE — Assessment & Plan Note (Addendum)
BP today 156/85, up from 131/64 at previous visit. With BP elevated will restart antihypertensives. He was previously on Lisinopril 5mg  Daily, which was stopped due to soft BP and lightheadedness. Will restart Lisinopril at reduced dose of 2.5mg  Daily and recheck at follow up. Renal function stable on recent labs at oncologists office and he has labs scheduled for later this month. - Lisinopril 2.5mg  Daily

## 2018-05-27 NOTE — Assessment & Plan Note (Addendum)
Patient has a history of recurrent fall previously treated with and improved by physical therapy. He suffered a fractured humerus following a recent fall (see separate note). He states that he has not noticed a significant change in his strength, but notes he seems to have miss-steps at times. He states it is similar to what was happening last year before he had physical therapy. Exam is fairly benign with slightly reduced strength in RLE. Will refer patient to physical therapy and reevaluate at follow up. - Referral to physical therapy - Patient has [employer] for transportation, but states he has difficulty affording this, he was provided phone numbers to call to request further assistance through [employer]

## 2018-05-27 NOTE — Assessment & Plan Note (Signed)
Patient seen recently in the ED for a fall. X-rays showed a proximal humerus fracture and possible occult radial head fracture. He was placed in a splint and sling in the ED and told to follow up with orthopedics. He was also provided a supply of pain medication at that time.  He has presented today with his left arm still in the splint and sling. He states that he tried to follow up with orthopedics, but he was unable to be seen due to an old unpaid balance with the office(s) he contacted. He also has run out of pain medication and his pain has persisted as bad as 8-9/10. Patient will be provided with referral to a new orthopedics office and given additional pain medication (he was informed to limited his use as much as possible given recent falls). - Remain in sling and splint - Referral to orthopedics - Norco 5-325mg  q12h PRN x10 Days, #20

## 2018-05-30 NOTE — Addendum Note (Signed)
Addended by: Gilles Chiquito B on: 05/30/2018 03:13 PM   Modules accepted: Level of Service

## 2018-05-30 NOTE — Progress Notes (Signed)
Internal Medicine Clinic Attending  Case discussed with Dr. Harbrecht at the time of the visit.  We reviewed the resident's history and exam and pertinent patient test results.  I agree with the assessment, diagnosis, and plan of care documented in the resident's note.   

## 2018-06-01 ENCOUNTER — Ambulatory Visit (INDEPENDENT_AMBULATORY_CARE_PROVIDER_SITE_OTHER): Payer: 59 | Admitting: Orthopaedic Surgery

## 2018-06-01 ENCOUNTER — Encounter (INDEPENDENT_AMBULATORY_CARE_PROVIDER_SITE_OTHER): Payer: Self-pay | Admitting: Orthopaedic Surgery

## 2018-06-01 ENCOUNTER — Ambulatory Visit (INDEPENDENT_AMBULATORY_CARE_PROVIDER_SITE_OTHER): Payer: Self-pay

## 2018-06-01 DIAGNOSIS — S42295G Other nondisplaced fracture of upper end of left humerus, subsequent encounter for fracture with delayed healing: Secondary | ICD-10-CM | POA: Diagnosis not present

## 2018-06-01 DIAGNOSIS — S52125A Nondisplaced fracture of head of left radius, initial encounter for closed fracture: Secondary | ICD-10-CM

## 2018-06-01 NOTE — Progress Notes (Signed)
Office Visit Note   Patient: Jacob Joseph           Date of Birth: 12/08/1955           MRN: [id number] Visit Date: 06/01/2018              Requested by: Neva Seat, MD [address] [place], [name] [address] PCP: Neva Seat, MD   Assessment & Plan: Visit Diagnoses:  1. Other closed nondisplaced fracture of proximal end of left humerus with delayed healing, subsequent encounter   2. Closed nondisplaced fracture of head of left radius, initial encounter     Plan: Impression is healing nondisplaced left proximal humerus fracture and left radial head fracture.  These should do well with nonsurgical treatment.  At this point I discontinue this splint.  Sling when out of the house.  I gave him a referral to therapy to begin range of motion for his shoulder and elbow.  Recheck in 3 weeks with AP and scapular Y x-rays of the left shoulder.  Follow-Up Instructions: Return in about 3 weeks (around 06/22/2018).   Orders:  Orders Placed This Encounter  Procedures  . XR Shoulder Left   No orders of the defined types were placed in this encounter.     Procedures: No procedures performed   Clinical Data: No additional findings.   Subjective: Chief Complaint  Patient presents with  . Left Shoulder - Pain    Jacob Joseph is a 62 year old disabled gentleman comes in with a nondisplaced left proximal humerus fracture that he sustained 3 weeks ago.  He also has an occult left radial head fracture.  He was placed in a long-arm splint and he had trouble getting referral to our office.  He comes in today for his first orthopedic evaluation.  Denies any significant pain.  Overall the pain has improved.  Denies any numbness and tingling.  No real alleviating or aggravating factors.   Review of Systems  Constitutional: Negative.   All other systems reviewed and are negative.    Objective: Vital Signs: There were no vitals taken for this visit.  Physical Exam  Constitutional: He  is oriented to person, place, and time. He appears well-developed and well-nourished.  HENT:  Head: Normocephalic and atraumatic.  Eyes: Pupils are equal, round, and reactive to light.  Neck: Neck supple.  Pulmonary/Chest: Effort normal.  Abdominal: Soft.  Musculoskeletal: Normal range of motion.  Neurological: He is alert and oriented to person, place, and time.  Skin: Skin is warm.  Psychiatric: He has a normal mood and affect. His behavior is normal. Judgment and thought content normal.  Nursing note and vitals reviewed.   Ortho Exam Left shoulder exam shows no significant discomfort with gentle passive range of motion.  Neurovascular intact.  No significant swelling or bruising.  He does have some mild tenderness of the radial head.  Elbow range of motion is somewhat limited but to be expected from 3 weeks of immobilization. Specialty Comments:  No specialty comments available.  Imaging: Xr Shoulder Left  Result Date: 06/01/2018 Evidence of callus formation around the fracture site consistent with healing    PMFS History: Patient Active Problem List   Diagnosis Date Noted  . Closed fracture of proximal end of left humerus with delayed healing 05/26/2018  . Spine metastasis ([place]) 03/24/2018  . Acute deep vein thrombosis (DVT) of right popliteal vein ([place])   . Prostate cancer metastatic to multiple sites [facility])   . Radicular pain of thoracic  region   . Bone lesion   . Calculus of gallbladder without cholecystitis without obstruction   . Chronic anticoagulation   . Right upper quadrant pain 02/26/2018  . Abnormal computed tomography of thoracic spine 02/26/2018  . Recurrent falls 11/02/2016  . History of prostate cancer 09/22/2016  . BPH associated with nocturia 06/19/2016  . Substance abuse ([place]) 01/27/2016  . Vitamin D deficiency 12/05/2015  . Hyperlipidemia 01/28/2015  . Cataracts, bilateral 08/29/2014  . Diabetes mellitus treated with oral medication ([place])  10/19/2013  . COPD, moderate ([place]) 05/31/2013  . Complex Lt Renal Cyst 03/22/2013  . Preventive measure 05/30/2012  . History of CVA 03/08/2012  . Normocytic anemia 03/08/2012  . CKD (chronic kidney disease) stage 3, GFR 30-59 ml/min (HCC) 03/08/2012  . ERECTILE DYSFUNCTION 04/25/2008  . HIV disease ([place]) 04/27/2006  . Smoking greater than 30 pack years 04/27/2006  . Major depressive disorder, recurrent episode ([place]) 04/27/2006  . Hypertension 04/27/2006  . DEGENERATIVE JOINT DISEASE 04/27/2006  . Chronic Low Back Pain 04/27/2006   Past Medical History:  Diagnosis Date  . AKI (acute kidney injury) ([place]) 09/24/2016  . Calcium oxalate crystals in urine 12/23/2011   Asymptomatic, no hematuria. Advised to take plenty of water.  [name] Kitchen COPD (chronic obstructive pulmonary disease) ([place])   . Depression   . Diabetes mellitus without complication ([place]) 6301   pre diabetic  . Hepatitis C   . HIV (human immunodeficiency virus infection) ([place])   . Hypertension   . PE (pulmonary thromboembolism) ([place]) 05/12/2016   PE in 2017, on chronic anticoagulation for this and recurrent DVT.  [name] Kitchen Peripheral arterial disease ([place])    ooccluded left SFA by duplex ultrasound, tibial disease left greater than right  . Prostate cancer metastatic to multiple sites [facility])   . Stroke ([place]) 11/2011   Carotids Doppler negative. Right sided weakness resolved, initially on Plavix for 3 months and then continued with ASA.    . TB lung, latent 1988   Treated    Family History  Problem Relation Age of Onset  . Hypertension Mother   . Heart attack Mother   . Stroke Mother   . Hypertension Father   . Cancer Father     Past Surgical History:  Procedure Laterality Date  . HERNIA REPAIR    . INGUINAL HERNIA REPAIR  01/16/2012   Procedure: HERNIA REPAIR INGUINAL INCARCERATED;  Surgeon: Zenovia Jarred, MD;  Location: [facility];  Service: General;  Laterality: Right;  . TEE WITHOUT CARDIOVERSION  12/07/2011   Procedure:  TRANSESOPHAGEAL ECHOCARDIOGRAM (TEE);  Surgeon: Birdie Riddle, MD;  Location[facility] ENDOSCOPY;  Service: Cardiovascular;  Laterality: N/A;   Social History   Occupational History  . Occupation: disabled  Tobacco Use  . Smoking status: Current Every Day Smoker    Packs/day: 0.45    Types: Cigarettes    Last attempt to quit: 11/18/2014    Years since quitting: 3.5  . Smokeless tobacco: Never Used  . Tobacco comment: .5 PPD  Substance and Sexual Activity  . Alcohol use: No    Alcohol/week: 0.0 standard drinks  . Drug use: Yes    Frequency: 3.0 times per week    Types: "Crack" cocaine, Cocaine  . Sexual activity: Not on file    Comment: declined condoms

## 2018-06-06 ENCOUNTER — Other Ambulatory Visit: Payer: Self-pay | Admitting: Internal Medicine

## 2018-06-06 NOTE — Telephone Encounter (Signed)
Refill approved.

## 2018-06-08 ENCOUNTER — Other Ambulatory Visit: Payer: Self-pay | Admitting: Oncology

## 2018-06-08 DIAGNOSIS — C61 Malignant neoplasm of prostate: Secondary | ICD-10-CM

## 2018-06-09 ENCOUNTER — Ambulatory Visit: Payer: 59 | Admitting: Oncology

## 2018-06-09 ENCOUNTER — Ambulatory Visit: Payer: 59

## 2018-06-09 ENCOUNTER — Other Ambulatory Visit: Payer: 59

## 2018-06-09 MED FILL — ABIRATERONE ACETATE 250 MG: 250 | 30 days supply | Qty: 120 | Fill #0

## 2018-06-14 ENCOUNTER — Telehealth: Payer: Self-pay

## 2018-06-14 NOTE — Telephone Encounter (Signed)
Attempted to contact patient to follow up on necessary dental clearance prior to starting Xgeva on 11/27. Unable to contact patient or other listed contacts with several attempts and unable to leave message. Contacted [facility] and they stated that they have not provided care since 04/2017 and no records of dental clearance obtained. Pharmacy and MD made aware of update.

## 2018-06-15 ENCOUNTER — Inpatient Hospital Stay: Payer: 59 | Admitting: Oncology

## 2018-06-15 ENCOUNTER — Inpatient Hospital Stay: Payer: 59

## 2018-06-15 ENCOUNTER — Telehealth: Payer: Self-pay | Admitting: Oncology

## 2018-06-15 NOTE — Telephone Encounter (Signed)
Tried to call it could not be completed at the time  °

## 2018-06-21 ENCOUNTER — Encounter: Payer: Self-pay | Admitting: *Deleted

## 2018-06-21 ENCOUNTER — Telehealth: Payer: Self-pay | Admitting: *Deleted

## 2018-06-21 NOTE — Telephone Encounter (Signed)
In-basket message to provider in reference to "provider call for "Denosimab Evaluation"  [facility] is correct provider to contact for this evaluation."

## 2018-06-24 ENCOUNTER — Telehealth: Payer: Self-pay | Admitting: Oncology

## 2018-06-24 ENCOUNTER — Telehealth: Payer: Self-pay | Admitting: *Deleted

## 2018-06-24 NOTE — Telephone Encounter (Signed)
Received TC from pt's family member to confirm next appointment. Informed her that pt's next appt is 07/01/18 @ 3pm for labs and 3:30 for Dr. Alen Blew

## 2018-06-24 NOTE — Telephone Encounter (Signed)
Patient left message re needing an appointment with [facility]. Patient already on schedule for lab/fu 12/13. Left message for patient and mailed schedule.

## 2018-06-30 ENCOUNTER — Other Ambulatory Visit: Payer: Self-pay | Admitting: Internal Medicine

## 2018-06-30 ENCOUNTER — Other Ambulatory Visit: Payer: Self-pay | Admitting: Oncology

## 2018-06-30 DIAGNOSIS — B20 Human immunodeficiency virus [HIV] disease: Secondary | ICD-10-CM

## 2018-06-30 NOTE — Telephone Encounter (Signed)
Refill approved.

## 2018-07-01 ENCOUNTER — Inpatient Hospital Stay (HOSPITAL_BASED_OUTPATIENT_CLINIC_OR_DEPARTMENT_OTHER): Payer: 59 | Admitting: Oncology

## 2018-07-01 ENCOUNTER — Telehealth: Payer: Self-pay

## 2018-07-01 ENCOUNTER — Inpatient Hospital Stay: Payer: 59 | Attending: Oncology

## 2018-07-01 VITALS — BP 157/94 | HR 110 | Temp 98.6°F | Resp 18 | Ht 72.0 in | Wt 206.4 lb

## 2018-07-01 DIAGNOSIS — Z923 Personal history of irradiation: Secondary | ICD-10-CM | POA: Diagnosis not present

## 2018-07-01 DIAGNOSIS — Z7982 Long term (current) use of aspirin: Secondary | ICD-10-CM | POA: Diagnosis not present

## 2018-07-01 DIAGNOSIS — C61 Malignant neoplasm of prostate: Secondary | ICD-10-CM | POA: Diagnosis present

## 2018-07-01 DIAGNOSIS — Z7901 Long term (current) use of anticoagulants: Secondary | ICD-10-CM | POA: Insufficient documentation

## 2018-07-01 DIAGNOSIS — B20 Human immunodeficiency virus [HIV] disease: Secondary | ICD-10-CM | POA: Insufficient documentation

## 2018-07-01 DIAGNOSIS — Z7984 Long term (current) use of oral hypoglycemic drugs: Secondary | ICD-10-CM

## 2018-07-01 DIAGNOSIS — C7951 Secondary malignant neoplasm of bone: Secondary | ICD-10-CM | POA: Diagnosis not present

## 2018-07-01 DIAGNOSIS — Z7902 Long term (current) use of antithrombotics/antiplatelets: Secondary | ICD-10-CM

## 2018-07-01 DIAGNOSIS — Z79899 Other long term (current) drug therapy: Secondary | ICD-10-CM | POA: Insufficient documentation

## 2018-07-01 LAB — CMP (CANCER CENTER ONLY)
ALK PHOS: 105 U/L (ref 38–126)
ALT: 6 U/L (ref 0–44)
ANION GAP: 11 (ref 5–15)
AST: 12 U/L — ABNORMAL LOW (ref 15–41)
Albumin: 3.4 g/dL — ABNORMAL LOW (ref 3.5–5.0)
BUN: 12 mg/dL (ref 8–23)
CALCIUM: 9.3 mg/dL (ref 8.9–10.3)
CHLORIDE: 108 mmol/L (ref 98–111)
CO2: 22 mmol/L (ref 22–32)
Creatinine: 1.32 mg/dL — ABNORMAL HIGH (ref 0.61–1.24)
GFR, Estimated: 58 mL/min — ABNORMAL LOW (ref >60)
Glucose, Bld: 109 mg/dL — ABNORMAL HIGH (ref 70–99)
Potassium: 3.8 mmol/L (ref 3.5–5.1)
SODIUM: 141 mmol/L (ref 135–145)
Total Bilirubin: 0.2 mg/dL — ABNORMAL LOW (ref 0.3–1.2)
Total Protein: 7.7 g/dL (ref 6.5–8.1)

## 2018-07-01 LAB — CBC WITH DIFFERENTIAL (CANCER CENTER ONLY)
Abs Immature Granulocytes: 0.04 10*3/uL (ref 0.00–0.07)
BASOS PCT: 0 %
Basophils Absolute: 0 10*3/uL (ref 0.0–0.1)
EOS PCT: 3 %
Eosinophils Absolute: 0.3 10*3/uL (ref 0.0–0.5)
HCT: 30.1 % — ABNORMAL LOW (ref 39.0–52.0)
Hemoglobin: 9.6 g/dL — ABNORMAL LOW (ref 13.0–17.0)
Immature Granulocytes: 0 %
Lymphocytes Relative: 11 %
Lymphs Abs: 1 10*3/uL (ref 0.7–4.0)
MCH: 27.7 pg (ref 26.0–34.0)
MCHC: 31.9 g/dL (ref 30.0–36.0)
MCV: 87 fL (ref 80.0–100.0)
MONO ABS: 0.5 10*3/uL (ref 0.1–1.0)
Monocytes Relative: 6 %
Neutro Abs: 7.1 10*3/uL (ref 1.7–7.7)
Neutrophils Relative %: 80 %
PLATELETS: 193 10*3/uL (ref 150–400)
RBC: 3.46 MIL/uL — AB (ref 4.22–5.81)
RDW: 15.9 % — ABNORMAL HIGH (ref 11.5–15.5)
WBC: 9 10*3/uL (ref 4.0–10.5)
nRBC: 0 % (ref 0.0–0.2)

## 2018-07-01 NOTE — Telephone Encounter (Signed)
Printed avs and calender of upcoming appointment. Per 12/13 los  

## 2018-07-01 NOTE — Progress Notes (Signed)
Hematology and Oncology Follow Up Visit  Jacob Joseph [id number] April 11, 1956 62 y.o. 07/01/2018 3:18 PM Alvina Chou, [place], MD   Principle Diagnosis: 62 year old man with advanced prostate cancer with disease to the bone diagnosed in August 2019.  He presented with advanced disease in 2017 with a PSA of 23.1.   Prior Therapy: He was treated with Lupron under the care of [name] every 4 months since this time of diagnosis. He is status post radiation therapy for a total of 30 Gy in 10 fractions to be completed in September 2019 to the thoracic spine.  Current therapy: Zytiga 1000 mg daily with prednisone at 5 mg daily started in August 2019.   Interim History: [name] returns today for a repeat evaluation.  Since the last visit, he reports no major complaints.  He continues to tolerate Zytiga without any new side effects.  He denies any sense of fatigue, tiredness or nausea.  His appetite remains reasonable without any changes in his performance status or quality of life.  He denies any worsening bone disease or pathological fractures.  He does not report any headaches, blurry vision, syncope or seizures.  He denies any alteration mental status or confusion.  Does not report any fevers, chills or sweats.  Does not report any cough, wheezing or hemoptysis.  Does not report any chest pain, palpitation, orthopnea or leg edema.  Does not report any nausea, vomiting or distention.  Does not report any pains in bowel habits. Does not report any arthralgias or myalgias.   Does not report frequency, urgency or hematuria.  Does not report any skin rashes or lesions.  Denies any bleeding or clotting complications.  Does not report any mood changes.  Remaining review of systems is negative.    Medications: I have reviewed the patient's current medications.  Current Outpatient Medications  Medication Sig Dispense Refill  . abiraterone acetate (ZYTIGA) 250 MG tablet TAKE 4  TABLETS (1,000 MG TOTAL) BY MOUTH DAILY. TAKE ON AN EMPTY STOMACH 1 HOUR BEFORE OR 2 HOURS AFTER A MEAL 120 tablet 0  . albuterol (PROAIR HFA) 108 (90 Base) MCG/ACT inhaler INHALE 2 PUFFS INTO THE LUNGS EVERY 6 HOURS AS NEEDED FOR WHEEZING OR SHORTNESS OF BREATH, TO NOT USE EVERY DAY, USE ONLY IF NEEDED 8.5 g 3  . apixaban (ELIQUIS) 2.5 MG TABS tablet Take 1 tablet (2.5 mg total) by mouth 2 (two) times daily. 60 tablet 11  . aspirin 81 MG EC tablet Take 1 tablet (81 mg total) by mouth daily. 30 tablet 5  . azelastine (ASTELIN) 0.1 % nasal spray USE 1 SPRAY IN EACH NOSTRIL TWICE DAILY AS NEEDED FOR RHINITIS 90 mL 1  . Blood Glucose Monitoring Suppl (ONETOUCH VERIO) w/Device KIT 1 Device by Does not apply route 2 (two) times daily. ICD 10  E11.8, non-insulin dependent, test up to twice daily 1 kit 0  . Budesonide 90 MCG/ACT inhaler Inhale 1-2 puffs into the lungs 2 (two) times daily. 1 Inhaler 3  . calcium gluconate 500 MG tablet Take 1 tablet (500 mg total) by mouth daily. 90 tablet 3  . cholecalciferol (VITAMIN D) 1000 units tablet Take 1 tablet (1,000 Units total) by mouth daily. 90 tablet 3  . citalopram (CELEXA) 20 MG tablet Take 20 mg by mouth daily.    . diclofenac sodium (VOLTAREN) 1 % GEL APPLY 4 GRAMS EXTERNALLY TO THE AFFECTED AREA FOUR TIMES DAILY AS NEEDED FOR PAIN 100 g 1  . gabapentin (NEURONTIN) 300  MG capsule TAKE 2 CAPSULES(600 MG) BY MOUTH TWICE DAILY 120 capsule 0  . GENVOYA 150-150-200-10 MG TABS tablet TAKE 1 TABLET BY MOUTH DAILY WITH BREAKFAST 30 tablet 5  . glucose blood (ONETOUCH VERIO) test strip Use as instructed ICD 10  E11.8, non-insulin dependent, test up to twice daily 100 each 11  . hydroxypropyl methylcellulose / hypromellose (ISOPTO TEARS / GONIOVISC) 2.5 % ophthalmic solution Place 1 drop into both eyes as needed for dry eyes.    . hydrOXYzine (VISTARIL) 50 MG capsule Take 50 mg by mouth at bedtime.     [name] Kitchen lisinopril (PRINIVIL,ZESTRIL) 2.5 MG tablet Take 1 tablet (2.5  mg total) by mouth daily. 90 tablet 1  . metFORMIN (GLUCOPHAGE) 500 MG tablet Take 1 tablet (500 mg total) by mouth daily with breakfast. 30 tablet 0  . ONETOUCH DELICA LANCETS FINE MISC 1 Stick by Does not apply route 2 (two) times daily. ICD 10  E11.8, non-insulin dependent, test up to twice daily 100 each 11  . oxyCODONE 10 MG TABS Take 1 tablet (10 mg total) by mouth every 6 (six) hours as needed for severe pain or breakthrough pain. 50 tablet 0  . polyethylene glycol (MIRALAX / GLYCOLAX) packet Take 17 g by mouth daily. 14 each 0  . pravastatin (PRAVACHOL) 40 MG tablet TAKE 1 TABLET(40 MG) BY MOUTH DAILY 90 tablet 3  . predniSONE (DELTASONE) 5 MG tablet TAKE 1 TABLET(5 MG) BY MOUTH DAILY WITH BREAKFAST 90 tablet 0  . PREZISTA 800 MG tablet TAKE 1 TABLET BY MOUTH EVERY DAY WITH BREAKFAST 30 tablet 5  . QUEtiapine (SEROQUEL) 300 MG tablet Take 300 mg by mouth 2 (two) times daily.     . sucralfate (CARAFATE) 1 g tablet Take 1 tablet (1 g total) by mouth 4 (four) times daily. 120 tablet 2  . tamsulosin (FLOMAX) 0.4 MG CAPS capsule TAKE 1 CAPSULE(0.4 MG) BY MOUTH DAILY AFTER BREAKFAST (Patient taking differently: Take 0.4 mg by mouth daily. TAKE 1 CAPSULE(0.4 MG) BY MOUTH DAILY AFTER BREAKFAST) 6 capsule 0  . Tiotropium Bromide-Olodaterol (STIOLTO RESPIMAT) 2.5-2.5 MCG/ACT AERS Inhale 2 puffs into the lungs daily. 3 Inhaler 3  . valACYclovir (VALTREX) 500 MG tablet TAKE 1 TABLET BY MOUTH DAILY (Patient taking differently: Take 500 mg by mouth daily. ) 90 tablet 2   No current facility-administered medications for this visit.      Allergies:  Allergies  Allergen Reactions  . Statins Other (See Comments)    Elevated liver enzymes.    Past Medical History, Surgical history, Social history, and Family History were reviewed and updated.   Physical Exam: Blood pressure (!) 157/94, pulse (!) 110, temperature 98.6 F (37 C), temperature source Oral, resp. rate 18, height 6' (1.829 m), weight  206 lb 6.4 oz (93.6 kg), SpO2 100 %.   ECOG: 0    General appearance: Comfortable appearing without any discomfort Head: Normocephalic without any trauma Oropharynx: Mucous membranes are moist and pink without any thrush or ulcers. Eyes: Pupils are equal and round reactive to light. Lymph nodes: No cervical, supraclavicular, inguinal or axillary lymphadenopathy.   Heart:regular rate and rhythm.  S1 and S2 without leg edema. Lung: Clear without any rhonchi or wheezes.  No dullness to percussion. Abdomin: Soft, nontender, nondistended with good bowel sounds.  No hepatosplenomegaly. Musculoskeletal: No joint deformity or effusion.  Full range of motion noted. Neurological: No deficits noted on motor, sensory and deep tendon reflex exam. Skin: No petechial rash or dryness.  Appeared  moist.      Lab Results: Lab Results  Component Value Date   WBC 9.0 07/01/2018   HGB 9.6 (L) 07/01/2018   HCT 30.1 (L) 07/01/2018   MCV 87.0 07/01/2018   PLT 193 07/01/2018     Chemistry      Component Value Date/Time   NA 140 04/12/2018 1505   NA 140 01/27/2016 1527   K 3.8 04/12/2018 1505   CL 109 04/12/2018 1505   CO2 21 (L) 04/12/2018 1505   BUN 12 04/12/2018 1505   BUN 14 01/27/2016 1527   CREATININE 1.33 (H) 04/12/2018 1505   CREATININE 1.51 (H) 11/04/2017 1129      Component Value Date/Time   CALCIUM 9.7 04/12/2018 1505   ALKPHOS 127 (H) 04/12/2018 1505   AST 11 (L) 04/12/2018 1505   ALT 8 04/12/2018 1505   BILITOT 0.3 04/12/2018 1505      Results for Jacob Joseph, Jacob Joseph (MRN [id number]) as of 07/01/2018 15:10  Ref. Range 06/19/2016 10:52 02/26/2018 22:04 04/12/2018 15:05  Prostate Specific Ag, Serum Latest Ref Range: 0.0 - 4.0 ng/mL 23.1 (H) 7.0 (H) 4.7 (H)      Impression and Plan:  63 year old man with the:  1.    Castration-resistant advanced prostate cancer with disease to the bone documented in 2019.    He remains on Zytiga and prednisone without any major  complications.  His PSA showed a reasonable response to therapy currently down to 4.7 in September 2019.  Risks and benefits of continuing this therapy versus switching to different approaches were reviewed.  At this time given his tolerance and reasonable PSA response I have recommended continuing this therapy.  He is agreeable to continue at this time.   2.  Androgen deprivation: He continues to receive Lupron under the care of [name] with last injection given in November of this year.  3.  Thoracic spine metastasis: No issues reported at this time after radiation therapy to the spine..  4.  Bone directed therapy: Delton See is on hold till dental clearance has been obtained.  I recommended calcium and vitamin D supplements for the time being.  5.  Prognosis and goals of care: His disease is incurable but aggressive therapy is warranted given his reasonable performance status at this time.   6.  HIV: Follows with Dr. Megan Salon from infectious disease at this time.  7.  Follow-up: We will be in 3 months to follow his progress.  15  minutes was spent with the patient face-to-face today.  More than 50% of time was dedicated to discussing his disease status, laboratory data and answering question regarding future plan of care.Zola Button, MD 12/13/20193:18 PM

## 2018-07-02 LAB — PROSTATE-SPECIFIC AG, SERUM (LABCORP): Prostate Specific Ag, Serum: 4.7 ng/mL — ABNORMAL HIGH (ref 0.0–4.0)

## 2018-07-04 ENCOUNTER — Ambulatory Visit: Payer: 59 | Attending: Rehabilitation | Admitting: Rehabilitation

## 2018-07-06 ENCOUNTER — Telehealth: Payer: Self-pay | Admitting: *Deleted

## 2018-07-06 NOTE — Telephone Encounter (Signed)
Medical records faxed to [facility]; RID [id number]

## 2018-07-18 ENCOUNTER — Other Ambulatory Visit: Payer: Self-pay | Admitting: Oncology

## 2018-07-18 DIAGNOSIS — C61 Malignant neoplasm of prostate: Secondary | ICD-10-CM

## 2018-07-18 MED FILL — ABIRATERONE ACETATE 250 MG: 250 | 30 days supply | Qty: 120 | Fill #0

## 2018-07-21 NOTE — Addendum Note (Signed)
Addended by: Hulan Fray on: 07/21/2018 05:31 PM   Modules accepted: Orders

## 2018-07-28 ENCOUNTER — Other Ambulatory Visit: Payer: Self-pay | Admitting: Internal Medicine

## 2018-07-28 DIAGNOSIS — E118 Type 2 diabetes mellitus with unspecified complications: Secondary | ICD-10-CM

## 2018-07-28 NOTE — Telephone Encounter (Signed)
Refills approved.

## 2018-08-15 ENCOUNTER — Other Ambulatory Visit: Payer: Self-pay | Admitting: Oncology

## 2018-08-15 DIAGNOSIS — C61 Malignant neoplasm of prostate: Secondary | ICD-10-CM

## 2018-08-15 MED FILL — ABIRATERONE ACETATE 250 MG: 250 | 30 days supply | Qty: 120 | Fill #0

## 2018-08-24 ENCOUNTER — Ambulatory Visit (INDEPENDENT_AMBULATORY_CARE_PROVIDER_SITE_OTHER): Payer: 59 | Admitting: Internal Medicine

## 2018-08-24 ENCOUNTER — Encounter: Payer: Self-pay | Admitting: Internal Medicine

## 2018-08-24 DIAGNOSIS — F17210 Nicotine dependence, cigarettes, uncomplicated: Secondary | ICD-10-CM

## 2018-08-24 DIAGNOSIS — B20 Human immunodeficiency virus [HIV] disease: Secondary | ICD-10-CM

## 2018-08-24 DIAGNOSIS — R296 Repeated falls: Secondary | ICD-10-CM

## 2018-08-24 DIAGNOSIS — F1910 Other psychoactive substance abuse, uncomplicated: Secondary | ICD-10-CM | POA: Diagnosis not present

## 2018-08-24 NOTE — Progress Notes (Signed)
Patient Active Problem List   Diagnosis Date Noted  . HIV disease ([place]) 04/27/2006    Priority: High  . Closed fracture of proximal end of left humerus with delayed healing 05/26/2018  . Spine metastasis ([place]) 03/24/2018  . Acute deep vein thrombosis (DVT) of right popliteal vein ([place])   . Prostate cancer metastatic to multiple sites [facility])   . Radicular pain of thoracic region   . Bone lesion   . Calculus of gallbladder without cholecystitis without obstruction   . Chronic anticoagulation   . Right upper quadrant pain 02/26/2018  . Abnormal computed tomography of thoracic spine 02/26/2018  . Recurrent falls 11/02/2016  . History of prostate cancer 09/22/2016  . BPH associated with nocturia 06/19/2016  . Substance abuse ([place]) 01/27/2016  . Vitamin D deficiency 12/05/2015  . Hyperlipidemia 01/28/2015  . Cataracts, bilateral 08/29/2014  . Diabetes mellitus treated with oral medication ([place]) 10/19/2013  . COPD, moderate ([place]) 05/31/2013  . Complex Lt Renal Cyst 03/22/2013  . Preventive measure 05/30/2012  . History of CVA 03/08/2012  . Normocytic anemia 03/08/2012  . CKD (chronic kidney disease) stage 3, GFR 30-59 ml/min (H[facility]) 03/08/2012  . ERECTILE DYSFUNCTION 04/25/2008  . Smoking greater than 30 pack years 04/27/2006  . Major depressive disorder, recurrent episode ([place]) 04/27/2006  . Hypertension 04/27/2006  . DEGENERATIVE JOINT DISEASE 04/27/2006  . Chronic Low Back Pain 04/27/2006    Patient's Medications  New Prescriptions   No medications on file  Previous Medications   ABIRATERONE ACETATE (ZYTIGA) 250 MG TABLET    TAKE 4 TABLETS (1,000 MG TOTAL) BY MOUTH DAILY. TAKE ON AN EMPTY STOMACH 1 HOUR BEFORE OR 2 HOURS AFTER A MEAL   ALBUTEROL (PROAIR HFA) 108 (90 BASE) MCG/ACT INHALER    INHALE 2 PUFFS INTO THE LUNGS EVERY 6 HOURS AS NEEDED FOR WHEEZING OR SHORTNESS OF BREATH, TO NOT USE EVERY DAY, USE ONLY IF NEEDED   APIXABAN (ELIQUIS) 2.5 MG TABS TABLET     Take 1 tablet (2.5 mg total) by mouth 2 (two) times daily.   ASPIRIN 81 MG EC TABLET    Take 1 tablet (81 mg total) by mouth daily.   AZELASTINE (ASTELIN) 0.1 % NASAL SPRAY    USE 1 SPRAY IN EACH NOSTRIL TWICE DAILY AS NEEDED FOR RHINITIS   BLOOD GLUCOSE MONITORING SUPPL (ONETOUCH VERIO) W/DEVICE KIT    1 Device by Does not apply route 2 (two) times daily. ICD 10  E11.8, non-insulin dependent, test up to twice daily   BUDESONIDE 90 MCG/ACT INHALER    Inhale 1-2 puffs into the lungs 2 (two) times daily.   CALCIUM GLUCONATE 500 MG TABLET    Take 1 tablet (500 mg total) by mouth daily.   CHOLECALCIFEROL (VITAMIN D) 1000 UNITS TABLET    Take 1 tablet (1,000 Units total) by mouth daily.   CITALOPRAM (CELEXA) 20 MG TABLET    Take 20 mg by mouth daily.   DICLOFENAC SODIUM (VOLTAREN) 1 % GEL    APPLY 4 GRAMS EXTERNALLY TO THE AFFECTED AREA FOUR TIMES DAILY AS NEEDED FOR PAIN   GABAPENTIN (NEURONTIN) 300 MG CAPSULE    TAKE 2 CAPSULES(600 MG) BY MOUTH TWICE DAILY   GENVOYA 150-150-200-10 MG TABS TABLET    TAKE 1 TABLET BY MOUTH DAILY WITH BREAKFAST   GLUCOSE BLOOD (ONETOUCH VERIO) TEST STRIP    Use as instructed ICD 10  E11.8, non-insulin dependent, test up to twice daily  HYDROXYPROPYL METHYLCELLULOSE / HYPROMELLOSE (ISOPTO TEARS / GONIOVISC) 2.5 % OPHTHALMIC SOLUTION    Place 1 drop into both eyes as needed for dry eyes.   HYDROXYZINE (VISTARIL) 50 MG CAPSULE    Take 50 mg by mouth at bedtime.    LISINOPRIL (PRINIVIL,ZESTRIL) 2.5 MG TABLET    Take 1 tablet (2.5 mg total) by mouth daily.   METFORMIN (GLUCOPHAGE) 500 MG TABLET    TAKE 1 TABLET BY MOUTH EVERY DAY WITH BREAKFAST   ONETOUCH DELICA LANCETS FINE MISC    1 Stick by Does not apply route 2 (two) times daily. ICD 10  E11.8, non-insulin dependent, test up to twice daily   OXYCODONE 10 MG TABS    Take 1 tablet (10 mg total) by mouth every 6 (six) hours as needed for severe pain or breakthrough pain.   POLYETHYLENE GLYCOL (MIRALAX / GLYCOLAX) PACKET     Take 17 g by mouth daily.   PRAVASTATIN (PRAVACHOL) 40 MG TABLET    TAKE 1 TABLET(40 MG) BY MOUTH DAILY   PREDNISONE (DELTASONE) 5 MG TABLET    TAKE 1 TABLET(5 MG) BY MOUTH DAILY WITH BREAKFAST   PREZISTA 800 MG TABLET    TAKE 1 TABLET BY MOUTH EVERY DAY WITH BREAKFAST   QUETIAPINE (SEROQUEL) 300 MG TABLET    Take 300 mg by mouth 2 (two) times daily.    SUCRALFATE (CARAFATE) 1 G TABLET    Take 1 tablet (1 g total) by mouth 4 (four) times daily.   TAMSULOSIN (FLOMAX) 0.4 MG CAPS CAPSULE    TAKE 1 CAPSULE(0.4 MG) BY MOUTH DAILY AFTER BREAKFAST   TIOTROPIUM BROMIDE-OLODATEROL (STIOLTO RESPIMAT) 2.5-2.5 MCG/ACT AERS    Inhale 2 puffs into the lungs daily.   VALACYCLOVIR (VALTREX) 500 MG TABLET    TAKE 1 TABLET BY MOUTH DAILY  Modified Medications   No medications on file  Discontinued Medications   No medications on file    Subjective: Jacob Joseph is in for his routine HIV follow-up visit.  He denies any problems obtaining, taking or tolerating his Genvoya and Prezista.  He denies missing any doses.  He last used cocaine about 1 month ago.  He is still smoking cigarettes and has no current plan to quit.  He was recently found to have metastatic prostate cancer and has undergone radiation therapy.  He has had problems with recurrent falls recently.  He fell and fractured his left humerus and radius last year.  He is still recovering from that.  He also has had several falls at home.  Recently he could not get up for an hour and a half and lay on the kitchen floor.  He has a cane at home but rarely uses it.  He was scheduled for physical therapy but could not get there because of transportation problems.  Review of Systems: Review of Systems  Constitutional: Positive for malaise/fatigue. Negative for chills, diaphoresis, fever and weight loss.  HENT: Negative for sore throat.   Respiratory: Negative for cough, sputum production and shortness of breath.   Cardiovascular: Negative for chest pain.    Gastrointestinal: Negative for abdominal pain, diarrhea, heartburn, nausea and vomiting.  Genitourinary: Negative for dysuria and frequency.  Musculoskeletal: Positive for back pain. Negative for joint pain and myalgias.  Skin: Negative for rash.  Neurological: Positive for focal weakness and weakness. Negative for dizziness and headaches.  Psychiatric/Behavioral: Positive for substance abuse. Negative for depression. The patient is not nervous/anxious.     Past Medical History:  Diagnosis  Date  . AKI (acute kidney injury) ([place]) 09/24/2016  . Calcium oxalate crystals in urine 12/23/2011   Asymptomatic, no hematuria. Advised to take plenty of water.  [name] Kitchen COPD (chronic obstructive pulmonary disease) ([place])   . Depression   . Diabetes mellitus without complication ([place]) 0347   pre diabetic  . Hepatitis C   . HIV (human immunodeficiency virus infection) ([place])   . Hypertension   . PE (pulmonary thromboembolism) ([place]) 05/12/2016   PE in 2017, on chronic anticoagulation for this and recurrent DVT.  [name] Kitchen Peripheral arterial disease ([place])    ooccluded left SFA by duplex ultrasound, tibial disease left greater than right  . Prostate cancer metastatic to multiple sites [facility])   . Stroke ([place]) 11/2011   Carotids Doppler negative. Right sided weakness resolved, initially on Plavix for 3 months and then continued with ASA.    . TB lung, latent 1988   Treated    Social History   Tobacco Use  . Smoking status: Current Every Day Smoker    Packs/day: 0.45    Types: Cigarettes    Last attempt to quit: 11/18/2014    Years since quitting: 3.7  . Smokeless tobacco: Never Used  . Tobacco comment: .5 PPD  Substance Use Topics  . Alcohol use: No    Alcohol/week: 0.0 standard drinks  . Drug use: Yes    Frequency: 3.0 times per week    Types: "Crack" cocaine, Cocaine    Family History  Problem Relation Age of Onset  . Hypertension Mother   . Heart attack Mother   . Stroke Mother   . Hypertension  Father   . Cancer Father     Allergies  Allergen Reactions  . Statins Other (See Comments)    Elevated liver enzymes.    Health Maintenance  Topic Date Due  . LIPID PANEL  08/24/2017  . INFLUENZA VACCINE  02/17/2018  . HEMOGLOBIN A1C  08/26/2018  . FOOT EXAM  03/04/2019  . OPHTHALMOLOGY EXAM  05/25/2019  . TETANUS/TDAP  05/30/2022  . COLONOSCOPY  05/17/2023  . PNEUMOCOCCAL POLYSACCHARIDE VACCINE AGE 71-64 HIGH RISK  Completed  . Hepatitis C Screening  Completed  . HIV Screening  Completed    Objective:  Vitals:   08/24/18 0838  BP: (!) 158/97  Pulse: 99  Temp: 99.2 F (37.3 C)  Weight: 213 lb (96.6 kg)   Body mass index is 28.89 kg/m.  Physical Exam  Lab Results Lab Results  Component Value Date   WBC 9.0 07/01/2018   HGB 9.6 (L) 07/01/2018   HCT 30.1 (L) 07/01/2018   MCV 87.0 07/01/2018   PLT 193 07/01/2018    Lab Results  Component Value Date   CREATININE 1.32 (H) 07/01/2018   BUN 12 07/01/2018   NA 141 07/01/2018   K 3.8 07/01/2018   CL 108 07/01/2018   CO2 22 07/01/2018    Lab Results  Component Value Date   ALT 6 07/01/2018   AST 12 (L) 07/01/2018   ALKPHOS 105 07/01/2018   BILITOT 0.2 (L) 07/01/2018    Lab Results  Component Value Date   CHOL 119 08/24/2016   HDL 39 (L) 08/24/2016   LDLCALC 52 08/24/2016   TRIG 142 08/24/2016   CHOLHDL 3.1 08/24/2016   Lab Results  Component Value Date   LABRPR NON-REACTIVE 11/04/2017   HIV 1 RNA Quant (copies/mL)  Date Value  11/04/2017 <20 NOT DETECTED  03/26/2017 <20 DETECTED (A)  08/24/2016 <20 DETECTED (A)  CD4 T Cell Abs (/uL)  Date Value  11/04/2017 580  03/26/2017 500  08/24/2016 680     Problem List Items Addressed This Visit      High   HIV disease ([place]) (Chronic)    His adherence has been much better over the past several years.  His infection remains under excellent, long-term control.  He will get lab work today, continue Science writer and follow-up in 6 months.       Relevant Orders   T-helper cell (CD4)- ([facility] clinic only)   HIV-1 RNA quant-no reflex-bld   CBC   Comprehensive metabolic panel   RPR   Lipid panel     Unprioritized   Substance abuse ([place])    I encouraged him to abstain from cocaine.      Smoking greater than 30 pack years    He is currently not prepared to try to quit.      Recurrent falls    I am very concerned about his recurrent falls.  I have removed carpets from their apartment and other things that might cause him to trip and fall.  I encouraged him to use his cane and to talk to his primary care provider about other ways to prevent falls.  He could benefit from physical therapy but transportation has been an issue.           Michel Bickers, MD [facility] [phone] pager   [phone] cell 08/24/2018, 10:08 AM

## 2018-08-24 NOTE — Assessment & Plan Note (Signed)
His adherence has been much better over the past several years.  His infection remains under excellent, long-term control.  He will get lab work today, continue Science writer and follow-up in 6 months.

## 2018-08-24 NOTE — Assessment & Plan Note (Signed)
I am very concerned about his recurrent falls.  I have removed carpets from their apartment and other things that might cause him to trip and fall.  I encouraged him to use his cane and to talk to his primary care provider about other ways to prevent falls.  He could benefit from physical therapy but transportation has been an issue.

## 2018-08-24 NOTE — Assessment & Plan Note (Signed)
He is currently not prepared to try to quit.

## 2018-08-24 NOTE — Assessment & Plan Note (Signed)
I encouraged him to abstain from cocaine.

## 2018-08-25 LAB — T-HELPER CELL (CD4) - (RCID CLINIC ONLY)
CD4 % Helper T Cell: 18 % — ABNORMAL LOW (ref 33–55)
CD4 T Cell Abs: 150 /uL — ABNORMAL LOW (ref 400–2700)

## 2018-08-26 LAB — COMPREHENSIVE METABOLIC PANEL
AG Ratio: 0.9 (calc) — ABNORMAL LOW (ref 1.0–2.5)
ALBUMIN MSPROF: 3.5 g/dL — AB (ref 3.6–5.1)
ALKALINE PHOSPHATASE (APISO): 109 U/L (ref 35–144)
ALT: 7 U/L — ABNORMAL LOW (ref 9–46)
AST: 12 U/L (ref 10–35)
BUN: 13 mg/dL (ref 7–25)
CHLORIDE: 104 mmol/L (ref 98–110)
CO2: 22 mmol/L (ref 20–32)
CREATININE: 1.06 mg/dL (ref 0.70–1.25)
Calcium: 8.9 mg/dL (ref 8.6–10.3)
GLOBULIN: 3.7 g/dL (ref 1.9–3.7)
GLUCOSE: 125 mg/dL — AB (ref 65–99)
Potassium: 3.9 mmol/L (ref 3.5–5.3)
SODIUM: 137 mmol/L (ref 135–146)
TOTAL PROTEIN: 7.2 g/dL (ref 6.1–8.1)
Total Bilirubin: 0.2 mg/dL (ref 0.2–1.2)

## 2018-08-26 LAB — LIPID PANEL
Cholesterol: 121 mg/dL (ref <200)
HDL: 47 mg/dL (ref >=40)
LDL Cholesterol (Calc): 52 mg/dL (calc)
Non-HDL Cholesterol (Calc): 74 mg/dL (calc) (ref <130)
Total CHOL/HDL Ratio: 2.6 (calc) (ref <5.0)
Triglycerides: 132 mg/dL (ref <150)

## 2018-08-26 LAB — CBC
HCT: 28 % — ABNORMAL LOW (ref 38.5–50.0)
Hemoglobin: 9.2 g/dL — ABNORMAL LOW (ref 13.2–17.1)
MCH: 27.3 pg (ref 27.0–33.0)
MCHC: 32.9 g/dL (ref 32.0–36.0)
MCV: 83.1 fL (ref 80.0–100.0)
MPV: 11.7 fL (ref 7.5–12.5)
PLATELETS: 260 10*3/uL (ref 140–400)
RBC: 3.37 10*6/uL — AB (ref 4.20–5.80)
RDW: 13.6 % (ref 11.0–15.0)
WBC: 7 10*3/uL (ref 3.8–10.8)

## 2018-08-26 LAB — RPR: RPR Ser Ql: NONREACTIVE

## 2018-08-26 LAB — HIV-1 RNA QUANT-NO REFLEX-BLD
HIV 1 RNA QUANT: NOT DETECTED {copies}/mL
HIV-1 RNA QUANT, LOG: NOT DETECTED {Log_copies}/mL

## 2018-08-29 ENCOUNTER — Other Ambulatory Visit: Payer: Self-pay | Admitting: Internal Medicine

## 2018-08-29 DIAGNOSIS — A6000 Herpesviral infection of urogenital system, unspecified: Secondary | ICD-10-CM

## 2018-08-31 ENCOUNTER — Emergency Department (HOSPITAL_COMMUNITY): Payer: 59

## 2018-08-31 ENCOUNTER — Encounter (HOSPITAL_COMMUNITY): Payer: Self-pay

## 2018-08-31 ENCOUNTER — Inpatient Hospital Stay (HOSPITAL_COMMUNITY)
Admission: EM | Admit: 2018-08-31 | Discharge: 2018-09-03 | DRG: 974 | Disposition: A | Discharge status: Home Health | Payer: 59 | Attending: Internal Medicine | Admitting: Internal Medicine

## 2018-08-31 ENCOUNTER — Other Ambulatory Visit: Payer: Self-pay

## 2018-08-31 DIAGNOSIS — J9601 Acute respiratory failure with hypoxia: Secondary | ICD-10-CM | POA: Diagnosis not present

## 2018-08-31 DIAGNOSIS — E1122 Type 2 diabetes mellitus with diabetic chronic kidney disease: Secondary | ICD-10-CM | POA: Diagnosis present

## 2018-08-31 DIAGNOSIS — R351 Nocturia: Secondary | ICD-10-CM | POA: Diagnosis not present

## 2018-08-31 DIAGNOSIS — B20 Human immunodeficiency virus [HIV] disease: Secondary | ICD-10-CM | POA: Diagnosis present

## 2018-08-31 DIAGNOSIS — A419 Sepsis, unspecified organism: Secondary | ICD-10-CM | POA: Diagnosis not present

## 2018-08-31 DIAGNOSIS — J9621 Acute and chronic respiratory failure with hypoxia: Secondary | ICD-10-CM | POA: Diagnosis present

## 2018-08-31 DIAGNOSIS — I10 Essential (primary) hypertension: Secondary | ICD-10-CM | POA: Diagnosis present

## 2018-08-31 DIAGNOSIS — E860 Dehydration: Secondary | ICD-10-CM | POA: Diagnosis present

## 2018-08-31 DIAGNOSIS — Z86711 Personal history of pulmonary embolism: Secondary | ICD-10-CM

## 2018-08-31 DIAGNOSIS — Z7984 Long term (current) use of oral hypoglycemic drugs: Secondary | ICD-10-CM

## 2018-08-31 DIAGNOSIS — E1151 Type 2 diabetes mellitus with diabetic peripheral angiopathy without gangrene: Secondary | ICD-10-CM | POA: Diagnosis present

## 2018-08-31 DIAGNOSIS — J441 Chronic obstructive pulmonary disease with (acute) exacerbation: Secondary | ICD-10-CM | POA: Diagnosis not present

## 2018-08-31 DIAGNOSIS — T380X5A Adverse effect of glucocorticoids and synthetic analogues, initial encounter: Secondary | ICD-10-CM | POA: Diagnosis present

## 2018-08-31 DIAGNOSIS — N401 Enlarged prostate with lower urinary tract symptoms: Secondary | ICD-10-CM | POA: Diagnosis not present

## 2018-08-31 DIAGNOSIS — N183 Chronic kidney disease, stage 3 unspecified: Secondary | ICD-10-CM | POA: Diagnosis present

## 2018-08-31 DIAGNOSIS — E118 Type 2 diabetes mellitus with unspecified complications: Secondary | ICD-10-CM

## 2018-08-31 DIAGNOSIS — F17210 Nicotine dependence, cigarettes, uncomplicated: Secondary | ICD-10-CM | POA: Diagnosis present

## 2018-08-31 DIAGNOSIS — R6520 Severe sepsis without septic shock: Secondary | ICD-10-CM | POA: Diagnosis not present

## 2018-08-31 DIAGNOSIS — Z7952 Long term (current) use of systemic steroids: Secondary | ICD-10-CM

## 2018-08-31 DIAGNOSIS — J189 Pneumonia, unspecified organism: Secondary | ICD-10-CM | POA: Diagnosis not present

## 2018-08-31 DIAGNOSIS — F1910 Other psychoactive substance abuse, uncomplicated: Secondary | ICD-10-CM | POA: Diagnosis present

## 2018-08-31 DIAGNOSIS — Z7951 Long term (current) use of inhaled steroids: Secondary | ICD-10-CM

## 2018-08-31 DIAGNOSIS — C799 Secondary malignant neoplasm of unspecified site: Secondary | ICD-10-CM | POA: Diagnosis present

## 2018-08-31 DIAGNOSIS — D72829 Elevated white blood cell count, unspecified: Secondary | ICD-10-CM | POA: Diagnosis not present

## 2018-08-31 DIAGNOSIS — C61 Malignant neoplasm of prostate: Secondary | ICD-10-CM | POA: Diagnosis not present

## 2018-08-31 DIAGNOSIS — Z7901 Long term (current) use of anticoagulants: Secondary | ICD-10-CM

## 2018-08-31 DIAGNOSIS — J440 Chronic obstructive pulmonary disease with acute lower respiratory infection: Secondary | ICD-10-CM | POA: Diagnosis present

## 2018-08-31 DIAGNOSIS — E119 Type 2 diabetes mellitus without complications: Secondary | ICD-10-CM | POA: Diagnosis not present

## 2018-08-31 DIAGNOSIS — M545 Low back pain: Secondary | ICD-10-CM | POA: Diagnosis present

## 2018-08-31 DIAGNOSIS — M5137 Other intervertebral disc degeneration, lumbosacral region: Secondary | ICD-10-CM | POA: Diagnosis present

## 2018-08-31 DIAGNOSIS — Z8673 Personal history of transient ischemic attack (TIA), and cerebral infarction without residual deficits: Secondary | ICD-10-CM | POA: Diagnosis not present

## 2018-08-31 DIAGNOSIS — E785 Hyperlipidemia, unspecified: Secondary | ICD-10-CM | POA: Diagnosis not present

## 2018-08-31 DIAGNOSIS — Z7982 Long term (current) use of aspirin: Secondary | ICD-10-CM

## 2018-08-31 DIAGNOSIS — C7951 Secondary malignant neoplasm of bone: Secondary | ICD-10-CM | POA: Diagnosis present

## 2018-08-31 DIAGNOSIS — S42295G Other nondisplaced fracture of upper end of left humerus, subsequent encounter for fracture with delayed healing: Secondary | ICD-10-CM | POA: Diagnosis not present

## 2018-08-31 DIAGNOSIS — Z79899 Other long term (current) drug therapy: Secondary | ICD-10-CM | POA: Diagnosis not present

## 2018-08-31 DIAGNOSIS — Z86718 Personal history of other venous thrombosis and embolism: Secondary | ICD-10-CM

## 2018-08-31 DIAGNOSIS — G8929 Other chronic pain: Secondary | ICD-10-CM | POA: Diagnosis present

## 2018-08-31 DIAGNOSIS — Y95 Nosocomial condition: Secondary | ICD-10-CM | POA: Diagnosis present

## 2018-08-31 DIAGNOSIS — I129 Hypertensive chronic kidney disease with stage 1 through stage 4 chronic kidney disease, or unspecified chronic kidney disease: Secondary | ICD-10-CM | POA: Diagnosis present

## 2018-08-31 DIAGNOSIS — S42202G Unspecified fracture of upper end of left humerus, subsequent encounter for fracture with delayed healing: Secondary | ICD-10-CM

## 2018-08-31 DIAGNOSIS — Z8546 Personal history of malignant neoplasm of prostate: Secondary | ICD-10-CM | POA: Diagnosis not present

## 2018-08-31 DIAGNOSIS — J449 Chronic obstructive pulmonary disease, unspecified: Secondary | ICD-10-CM | POA: Diagnosis not present

## 2018-08-31 DIAGNOSIS — M51379 Other intervertebral disc degeneration, lumbosacral region without mention of lumbar back pain or lower extremity pain: Secondary | ICD-10-CM | POA: Diagnosis present

## 2018-08-31 DIAGNOSIS — D72828 Other elevated white blood cell count: Secondary | ICD-10-CM | POA: Diagnosis present

## 2018-08-31 HISTORY — DX: Sepsis, unspecified organism: R65.20

## 2018-08-31 HISTORY — DX: Sepsis, unspecified organism: A41.9

## 2018-08-31 LAB — URINALYSIS, ROUTINE W REFLEX MICROSCOPIC
Bacteria, UA: NONE SEEN
Bilirubin Urine: NEGATIVE
Glucose, UA: NEGATIVE mg/dL
Ketones, ur: NEGATIVE mg/dL
Leukocytes,Ua: NEGATIVE
Nitrite: NEGATIVE
Protein, ur: 30 mg/dL — AB
SPECIFIC GRAVITY, URINE: 1.013 (ref 1.005–1.030)
pH: 6 (ref 5.0–8.0)

## 2018-08-31 LAB — GLUCOSE, CAPILLARY
GLUCOSE-CAPILLARY: 154 mg/dL — AB (ref 70–99)
Glucose-Capillary: 129 mg/dL — ABNORMAL HIGH (ref 70–99)

## 2018-08-31 LAB — BLOOD GAS, ARTERIAL
Acid-base deficit: 0.8 mmol/L (ref 0.0–2.0)
Acid-base deficit: 1.8 mmol/L (ref 0.0–2.0)
Bicarbonate: 23 mmol/L (ref 20.0–28.0)
Bicarbonate: 22.8 mmol/L (ref 20.0–28.0)
FIO2: 100
FIO2: 100
O2 SAT: 98.5 %
O2 Saturation: 64.6 %
Patient temperature: 39.4
Patient temperature: 39.4
pCO2 arterial: 50.2 mmHg — ABNORMAL HIGH (ref 32.0–48.0)
pCO2 arterial: 41.6 mmHg (ref 32.0–48.0)
pH, Arterial: 7.312 — ABNORMAL LOW (ref 7.350–7.450)
pH, Arterial: 7.359 (ref 7.350–7.450)
pO2, Arterial: 47 mmHg — ABNORMAL LOW (ref 83.0–108.0)
pO2, Arterial: 149 mmHg — ABNORMAL HIGH (ref 83.0–108.0)

## 2018-08-31 LAB — RAPID URINE DRUG SCREEN, HOSP PERFORMED
Amphetamines: NOT DETECTED
BENZODIAZEPINES: NOT DETECTED
Barbiturates: NOT DETECTED
Cocaine: POSITIVE — AB
Opiates: NOT DETECTED
Tetrahydrocannabinol: NOT DETECTED

## 2018-08-31 LAB — RESPIRATORY PANEL BY PCR
Adenovirus: NOT DETECTED
BORDETELLA PERTUSSIS-RVPCR: NOT DETECTED
CORONAVIRUS HKU1-RVPPCR: NOT DETECTED
Chlamydophila pneumoniae: NOT DETECTED
Coronavirus 229E: NOT DETECTED
Coronavirus NL63: NOT DETECTED
Coronavirus OC43: NOT DETECTED
Influenza A: NOT DETECTED
Influenza B: NOT DETECTED
Metapneumovirus: NOT DETECTED
Mycoplasma pneumoniae: NOT DETECTED
Parainfluenza Virus 1: NOT DETECTED
Parainfluenza Virus 2: NOT DETECTED
Parainfluenza Virus 3: NOT DETECTED
Parainfluenza Virus 4: NOT DETECTED
RHINOVIRUS / ENTEROVIRUS - RVPPCR: NOT DETECTED
Respiratory Syncytial Virus: NOT DETECTED

## 2018-08-31 LAB — COMPREHENSIVE METABOLIC PANEL
ALK PHOS: 78 U/L (ref 38–126)
ALT: 9 U/L (ref 0–44)
AST: 19 U/L (ref 15–41)
Albumin: 2.9 g/dL — ABNORMAL LOW (ref 3.5–5.0)
Anion gap: 11 (ref 5–15)
BUN: 16 mg/dL (ref 8–23)
CO2: 21 mmol/L — ABNORMAL LOW (ref 22–32)
Calcium: 8.7 mg/dL — ABNORMAL LOW (ref 8.9–10.3)
Chloride: 106 mmol/L (ref 98–111)
Creatinine, Ser: 1.34 mg/dL — ABNORMAL HIGH (ref 0.61–1.24)
GFR calc Af Amer: >60 mL/min (ref >60)
GFR calc non Af Amer: 57 mL/min — ABNORMAL LOW (ref >60)
Glucose, Bld: 164 mg/dL — ABNORMAL HIGH (ref 70–99)
Potassium: 3.3 mmol/L — ABNORMAL LOW (ref 3.5–5.1)
Sodium: 138 mmol/L (ref 135–145)
Total Bilirubin: 0.3 mg/dL (ref 0.3–1.2)
Total Protein: 6.7 g/dL (ref 6.5–8.1)

## 2018-08-31 LAB — LACTIC ACID, PLASMA
Lactic Acid, Venous: 5.1 mmol/L (ref 0.5–1.9)
Lactic Acid, Venous: 3.2 mmol/L (ref 0.5–1.9)
Lactic Acid, Venous: 2.3 mmol/L (ref 0.5–1.9)

## 2018-08-31 LAB — CBC WITH DIFFERENTIAL/PLATELET
Abs Immature Granulocytes: 0.02 10*3/uL (ref 0.00–0.07)
Basophils Absolute: 0 10*3/uL (ref 0.0–0.1)
Basophils Relative: 0 %
EOS ABS: 0.1 10*3/uL (ref 0.0–0.5)
Eosinophils Relative: 1 %
HCT: 29.9 % — ABNORMAL LOW (ref 39.0–52.0)
Hemoglobin: 9.4 g/dL — ABNORMAL LOW (ref 13.0–17.0)
Immature Granulocytes: 0 %
Lymphocytes Relative: 7 %
Lymphs Abs: 0.5 10*3/uL — ABNORMAL LOW (ref 0.7–4.0)
MCH: 27.1 pg (ref 26.0–34.0)
MCHC: 31.4 g/dL (ref 30.0–36.0)
MCV: 86.2 fL (ref 80.0–100.0)
Monocytes Absolute: 0.2 10*3/uL (ref 0.1–1.0)
Monocytes Relative: 2 %
Neutro Abs: 6.2 10*3/uL (ref 1.7–7.7)
Neutrophils Relative %: 90 %
Platelets: 255 10*3/uL (ref 150–400)
RBC: 3.47 MIL/uL — ABNORMAL LOW (ref 4.22–5.81)
RDW: 15.5 % (ref 11.5–15.5)
WBC Morphology: INCREASED
WBC: 6.9 10*3/uL (ref 4.0–10.5)
nRBC: 0 % (ref 0.0–0.2)

## 2018-08-31 LAB — STREP PNEUMONIAE URINARY ANTIGEN: STREP PNEUMO URINARY ANTIGEN: POSITIVE — AB

## 2018-08-31 MED ORDER — APIXABAN 2.5 MG PO TABS
2.5000 mg | ORAL_TABLET | Freq: Two times a day (BID) | ORAL | Status: DC
  Administered 2018-08-31 – 2018-09-03 (×6): 2.5 mg via ORAL
  Filled 2018-08-31 (×10): qty 1

## 2018-08-31 MED ORDER — GABAPENTIN 300 MG PO CAPS
300.0000 mg | ORAL_CAPSULE | Freq: Two times a day (BID) | ORAL | Status: DC
  Administered 2018-08-31 – 2018-09-03 (×6): 300 mg via ORAL
  Filled 2018-08-31 (×6): qty 1

## 2018-08-31 MED ORDER — ONDANSETRON HCL 4 MG PO TABS: 4.0000 mg | ORAL_TABLET | Freq: Four times a day (QID) | ORAL | Status: DC | PRN

## 2018-08-31 MED ORDER — SODIUM CHLORIDE 0.9 % IV BOLUS (SEPSIS)
1000.0000 mL | Freq: Once | INTRAVENOUS | Status: AC
  Administered 2018-08-31: 1000 mL via INTRAVENOUS

## 2018-08-31 MED ORDER — ASPIRIN EC 81 MG PO TBEC
81.0000 mg | DELAYED_RELEASE_TABLET | Freq: Every day | ORAL | Status: DC
  Administered 2018-09-01 – 2018-09-03 (×3): 81 mg via ORAL
  Filled 2018-08-31 (×3): qty 1

## 2018-08-31 MED ORDER — INSULIN ASPART 100 UNIT/ML ~~LOC~~ SOLN: 0.0000 [IU] | Freq: Every day | SUBCUTANEOUS | Status: DC

## 2018-08-31 MED ORDER — SODIUM CHLORIDE 0.9 % IV SOLN
2.0000 g | Freq: Once | INTRAVENOUS | Status: AC
  Administered 2018-08-31: 2 g via INTRAVENOUS
  Filled 2018-08-31: qty 2

## 2018-08-31 MED ORDER — VANCOMYCIN HCL 10 G IV SOLR
1250.0000 mg | Freq: Two times a day (BID) | INTRAVENOUS | Status: DC
  Administered 2018-08-31: 1250 mg via INTRAVENOUS
  Filled 2018-08-31 (×4): qty 1250

## 2018-08-31 MED ORDER — SUCRALFATE 1 G PO TABS
1.0000 g | ORAL_TABLET | Freq: Four times a day (QID) | ORAL | Status: DC
  Administered 2018-08-31 – 2018-09-03 (×10): 1 g via ORAL
  Filled 2018-08-31 (×10): qty 1

## 2018-08-31 MED ORDER — GUAIFENESIN ER 600 MG PO TB12
1200.0000 mg | ORAL_TABLET | Freq: Two times a day (BID) | ORAL | Status: DC
  Administered 2018-08-31 – 2018-09-03 (×6): 1200 mg via ORAL
  Filled 2018-08-31 (×6): qty 2

## 2018-08-31 MED ORDER — NICOTINE 21 MG/24HR TD PT24
21.0000 mg | MEDICATED_PATCH | Freq: Every day | TRANSDERMAL | Status: DC
  Administered 2018-08-31 – 2018-09-03 (×4): 21 mg via TRANSDERMAL
  Filled 2018-08-31 (×4): qty 1

## 2018-08-31 MED ORDER — QUETIAPINE FUMARATE 100 MG PO TABS
300.0000 mg | ORAL_TABLET | Freq: Two times a day (BID) | ORAL | Status: DC
  Administered 2018-08-31 – 2018-09-03 (×6): 300 mg via ORAL
  Filled 2018-08-31 (×6): qty 3

## 2018-08-31 MED ORDER — POLYVINYL ALCOHOL 1.4 % OP SOLN
1.0000 [drp] | OPHTHALMIC | Status: DC | PRN
  Filled 2018-08-31: qty 15

## 2018-08-31 MED ORDER — VANCOMYCIN HCL IN DEXTROSE 1-5 GM/200ML-% IV SOLN: 1000.0000 mg | Freq: Once | INTRAVENOUS | Status: DC

## 2018-08-31 MED ORDER — VANCOMYCIN HCL IN DEXTROSE 1-5 GM/200ML-% IV SOLN: 1000.0000 mg | Freq: Two times a day (BID) | INTRAVENOUS | Status: DC

## 2018-08-31 MED ORDER — HYDROCODONE-ACETAMINOPHEN 5-325 MG PO TABS: 1.0000 | ORAL_TABLET | ORAL | Status: DC | PRN

## 2018-08-31 MED ORDER — ELVITEG-COBIC-EMTRICIT-TENOFAF 150-150-200-10 MG PO TABS
1.0000 | ORAL_TABLET | Freq: Every day | ORAL | Status: DC
  Administered 2018-09-01 – 2018-09-03 (×3): 1 via ORAL
  Filled 2018-08-31 (×5): qty 1

## 2018-08-31 MED ORDER — VANCOMYCIN HCL 10 G IV SOLR
2000.0000 mg | Freq: Once | INTRAVENOUS | Status: AC
  Administered 2018-08-31: 2000 mg via INTRAVENOUS
  Filled 2018-08-31: qty 2000

## 2018-08-31 MED ORDER — SODIUM CHLORIDE 0.9 % IV SOLN
2.0000 g | Freq: Three times a day (TID) | INTRAVENOUS | Status: DC
  Administered 2018-08-31 – 2018-09-03 (×9): 2 g via INTRAVENOUS
  Filled 2018-08-31 (×15): qty 2

## 2018-08-31 MED ORDER — INSULIN ASPART 100 UNIT/ML ~~LOC~~ SOLN
0.0000 [IU] | Freq: Three times a day (TID) | SUBCUTANEOUS | Status: DC
  Administered 2018-08-31: 7 [IU] via SUBCUTANEOUS
  Administered 2018-09-01: 4 [IU] via SUBCUTANEOUS
  Administered 2018-09-01: 7 [IU] via SUBCUTANEOUS
  Administered 2018-09-01 – 2018-09-02 (×2): 4 [IU] via SUBCUTANEOUS
  Administered 2018-09-02: 7 [IU] via SUBCUTANEOUS
  Administered 2018-09-02: 4 [IU] via SUBCUTANEOUS
  Administered 2018-09-03: 3 [IU] via SUBCUTANEOUS

## 2018-08-31 MED ORDER — INSULIN ASPART 100 UNIT/ML ~~LOC~~ SOLN
6.0000 [IU] | Freq: Three times a day (TID) | SUBCUTANEOUS | Status: DC
  Administered 2018-09-01 – 2018-09-03 (×8): 6 [IU] via SUBCUTANEOUS

## 2018-08-31 MED ORDER — VALACYCLOVIR HCL 500 MG PO TABS
500.0000 mg | ORAL_TABLET | Freq: Every day | ORAL | Status: DC
  Administered 2018-09-01 – 2018-09-03 (×3): 500 mg via ORAL
  Filled 2018-08-31 (×6): qty 1

## 2018-08-31 MED ORDER — DARUNAVIR ETHANOLATE 800 MG PO TABS
800.0000 mg | ORAL_TABLET | Freq: Every day | ORAL | Status: DC
  Administered 2018-09-01 – 2018-09-03 (×3): 800 mg via ORAL
  Filled 2018-08-31 (×5): qty 1

## 2018-08-31 MED ORDER — PRAVASTATIN SODIUM 40 MG PO TABS
40.0000 mg | ORAL_TABLET | Freq: Every day | ORAL | Status: DC
  Administered 2018-09-01 – 2018-09-02 (×2): 40 mg via ORAL
  Filled 2018-08-31 (×4): qty 1

## 2018-08-31 MED ORDER — CHLORHEXIDINE GLUCONATE 0.12 % MT SOLN
15.0000 mL | Freq: Two times a day (BID) | OROMUCOSAL | Status: DC
  Administered 2018-08-31 – 2018-09-03 (×6): 15 mL via OROMUCOSAL
  Filled 2018-08-31 (×6): qty 15

## 2018-08-31 MED ORDER — ABIRATERONE ACETATE 250 MG PO TABS
1000.0000 mg | ORAL_TABLET | Freq: Every day | ORAL | Status: DC
  Administered 2018-09-01 – 2018-09-03 (×3): 1000 mg via ORAL

## 2018-08-31 MED ORDER — LORAZEPAM 2 MG/ML IJ SOLN: 0.5000 mg | INTRAMUSCULAR | Status: DC | PRN

## 2018-08-31 MED ORDER — CITALOPRAM HYDROBROMIDE 20 MG PO TABS
20.0000 mg | ORAL_TABLET | Freq: Every day | ORAL | Status: DC
  Administered 2018-09-01 – 2018-09-03 (×3): 20 mg via ORAL
  Filled 2018-08-31 (×6): qty 1

## 2018-08-31 MED ORDER — ONDANSETRON HCL 4 MG/2ML IJ SOLN: 4.0000 mg | Freq: Four times a day (QID) | INTRAMUSCULAR | Status: DC | PRN

## 2018-08-31 MED ORDER — HYDROXYZINE HCL 25 MG PO TABS
50.0000 mg | ORAL_TABLET | Freq: Every day | ORAL | Status: DC
  Administered 2018-08-31 – 2018-09-02 (×3): 50 mg via ORAL
  Filled 2018-08-31 (×2): qty 2
  Filled 2018-08-31 (×2): qty 1
  Filled 2018-08-31: qty 2

## 2018-08-31 MED ORDER — METRONIDAZOLE IN NACL 5-0.79 MG/ML-% IV SOLN
500.0000 mg | Freq: Three times a day (TID) | INTRAVENOUS | Status: DC
  Administered 2018-08-31: 500 mg via INTRAVENOUS
  Filled 2018-08-31: qty 100

## 2018-08-31 MED ORDER — POTASSIUM CHLORIDE IN NACL 20-0.9 MEQ/L-% IV SOLN
INTRAVENOUS | Status: DC
  Administered 2018-08-31 – 2018-09-01 (×2): via INTRAVENOUS
  Filled 2018-08-31: qty 1000

## 2018-08-31 MED ORDER — ACETAMINOPHEN 325 MG PO TABS: 650.0000 mg | ORAL_TABLET | Freq: Four times a day (QID) | ORAL | Status: DC | PRN

## 2018-08-31 MED ORDER — IPRATROPIUM-ALBUTEROL 0.5-2.5 (3) MG/3ML IN SOLN
3.0000 mL | Freq: Four times a day (QID) | RESPIRATORY_TRACT | Status: DC
  Administered 2018-08-31 – 2018-09-03 (×13): 3 mL via RESPIRATORY_TRACT
  Filled 2018-08-31 (×13): qty 3

## 2018-08-31 MED ORDER — METHYLPREDNISOLONE SODIUM SUCC 125 MG IJ SOLR
60.0000 mg | Freq: Two times a day (BID) | INTRAMUSCULAR | Status: AC
  Administered 2018-08-31 – 2018-09-01 (×3): 60 mg via INTRAVENOUS
  Filled 2018-08-31 (×3): qty 2

## 2018-08-31 MED ORDER — SENNOSIDES-DOCUSATE SODIUM 8.6-50 MG PO TABS: 1.0000 | ORAL_TABLET | Freq: Every evening | ORAL | Status: DC | PRN

## 2018-08-31 MED ORDER — ORAL CARE MOUTH RINSE
15.0000 mL | Freq: Two times a day (BID) | OROMUCOSAL | Status: DC
  Administered 2018-09-01 – 2018-09-02 (×4): 15 mL via OROMUCOSAL

## 2018-08-31 MED ORDER — ACETAMINOPHEN 650 MG RE SUPP
650.0000 mg | Freq: Four times a day (QID) | RECTAL | Status: DC | PRN
  Administered 2018-08-31: 650 mg via RECTAL
  Filled 2018-08-31: qty 1

## 2018-08-31 NOTE — ED Notes (Signed)
CRITICAL VALUE ALERT  Critical Value:  Lactic 5.1  Date & Time Notied:  08/31/2018, 4917  Provider Notified: Dr. Rogene Houston  Orders Received/Actions taken: see chart

## 2018-08-31 NOTE — ED Notes (Signed)
Pt placed on Bipap

## 2018-08-31 NOTE — Progress Notes (Signed)
Pharmacy Antibiotic Note  Jacob Joseph is a 63 y.o. male admitted on 08/31/2018 with infection- unknown source.  Pharmacy has been consulted for Vancomycin/Cefepime dosing.  Plan: Vancomycin 2000 mg IV x 1 dose. Vancomycin 1250 mg IV every 12 hours.  Goal trough 15-20 mcg/mL.  Cefepime 2000 mg IV every 8 hours. Monitor labs, c/s, and vanco levels as indicated.  Height: 6' (182.9 cm) Weight: 220 lb (99.8 kg) IBW/kg (Calculated) : 77.6  Temp (24hrs), Avg:101.2 F (38.4 C), Min:99.4 F (37.4 C), Max:103 F (39.4 C)  Recent Labs  Lab 08/31/18 0814  WBC 6.9  CREATININE 1.34*  LATICACIDVEN 5.1*    Estimated Creatinine Clearance: 70.8 mL/min (A) (by C-G formula based on SCr of 1.34 mg/dL (H)).    Allergies  Allergen Reactions  . Statins Other (See Comments)    Elevated liver enzymes.    Antimicrobials this admission: Vanco 2/12 >>  Cefepime 2/12 >>  Flagyl 2/12 >>  Dose adjustments this admission: N/A  Microbiology results: 2/12 BCx: pending     Thank you for allowing pharmacy to be a part of this patient's care.  Ramond Craver 08/31/2018 10:26 AM

## 2018-08-31 NOTE — H&P (Addendum)
History and Physical  Wise Fees NIO:[id number] DOB: April 22, 1956 DOA: 08/31/2018  Referring physician: Rogene Houston  PCP: Neva Seat, MD   Chief Complaint: Difficulty Breathing  HPI: Jacob Joseph is a 63 y.o. male with complex past medical history including HIV, COPD, longtime smoker, history of CVA, type 2 diabetes mellitus, hep C, metastatic prostate cancer, chronic anticoagulation, history of PE and DVT, substance abuse and other history detailed below who presented to the emergency department with abrupt onset of shortness of breath.  The symptoms started yesterday.  The patient had been doing fairly well today.  He had seen his ID doctor last week and was doing well.  EMS had him on nonrebreather and he was satting at 85%.  When he arrived in the emergency department he was immediately placed on BiPAP with improvement in symptoms.  Patient was able to speak after he was on BiPAP therapy and denied having chest pain.  He continued to have shortness of breath.  He was also noted to be wheezing and coughing with tachypnea.  His wife reports that he only noticed difficulty with breathing in the past several hours.  There are no known sick contacts.  ED course: On arrival the patient was immediately placed on BiPAP with improvement.  His initial VBG showed a pH of 7.312, PCO2 50.2, PO2 47.  After BiPAP and ABG was obtained with pH 7.359, PCO2 41.6, PO2 149.  The patient was noted to have a potassium of 3.3, glucose 164, creatinine 1.34.  The albumin was 2.9.  Lactic acid was 5.1.  White blood cell count 6.9.  Hemoglobin 9.4.  He was started on a sepsis bundle and received 3 L of IV fluids.  Repeat lactic acid was 3.2.  His temperature was 101.9.  Respirations 28, pulse 116, blood pressure 109/73.  Pulse ox 97% on BiPAP.  Chest x-ray demonstrated a new right perihilar infiltrate consistent with a dense pneumonia.  The patient is being admitted to the stepdown unit with severe sepsis, pneumonia,  immunodeficiency, dehydration, acute on chronic respiratory failure and will be maintained on BiPAP therapy.  Review of Systems:  All systems reviewed and apart from history of presenting illness, are negative.  Past Medical History:  Diagnosis Date  . AKI (acute kidney injury) ([place]) 09/24/2016  . Calcium oxalate crystals in urine 12/23/2011   Asymptomatic, no hematuria. Advised to take plenty of water.  [name] Kitchen COPD (chronic obstructive pulmonary disease) ([place])   . Depression   . Diabetes mellitus without complication ([place]) 8182   pre diabetic  . Hepatitis C   . HIV (human immunodeficiency virus infection) ([place])   . Hypertension   . PE (pulmonary thromboembolism) ([place]) 05/12/2016   PE in 2017, on chronic anticoagulation for this and recurrent DVT.  [name] Kitchen Peripheral arterial disease ([place])    ooccluded left SFA by duplex ultrasound, tibial disease left greater than right  . Prostate cancer metastatic to multiple sites [facility])   . Stroke ([place]) 11/2011   Carotids Doppler negative. Right sided weakness resolved, initially on Plavix for 3 months and then continued with ASA.    . TB lung, latent 1988   Treated   Past Surgical History:  Procedure Laterality Date  . HERNIA REPAIR    . INGUINAL HERNIA REPAIR  01/16/2012   Procedure: HERNIA REPAIR INGUINAL INCARCERATED;  Surgeon: Zenovia Jarred, MD;  Location: [facility];  Service: General;  Laterality: Right;  . TEE WITHOUT CARDIOVERSION  12/07/2011   Procedure: TRANSESOPHAGEAL ECHOCARDIOGRAM (TEE);  Surgeon:  Birdie Riddle, MD;  Location: [facility];  Service: Cardiovascular;  Laterality: N/A;   Social History:  reports that he has been smoking cigarettes. He has been smoking about 0.45 packs per day. He has never used smokeless tobacco. He reports current drug use. Frequency: 3.00 times per week. Drugs: "Crack" cocaine and Cocaine. He reports that he does not drink alcohol.  Allergies  Allergen Reactions  . Statins Other (See Comments)    Elevated liver  enzymes.    Family History  Problem Relation Age of Onset  . Hypertension Mother   . Heart attack Mother   . Stroke Mother   . Hypertension Father   . Cancer Father     Prior to Admission medications   Medication Sig Start Date End Date Taking? Authorizing Provider  abiraterone acetate (ZYTIGA) 250 MG tablet TAKE 4 TABLETS (1,000 MG TOTAL) BY MOUTH DAILY. TAKE ON AN EMPTY STOMACH 1 HOUR BEFORE OR 2 HOURS AFTER A MEAL 08/15/18   [name], MD  albuterol (PROAIR HFA) 108 (90 Base) MCG/ACT inhaler INHALE 2 PUFFS INTO THE LUNGS EVERY 6 HOURS AS NEEDED FOR WHEEZING OR SHORTNESS OF BREATH, TO NOT USE EVERY DAY, USE ONLY IF NEEDED Patient not taking: Reported on 08/24/2018 01/07/17   Axel Filler, MD  apixaban (ELIQUIS) 2.5 MG TABS tablet Take 1 tablet (2.5 mg total) by mouth 2 (two) times daily. 04/28/18   Neva Seat, MD  aspirin 81 MG EC tablet Take 1 tablet (81 mg total) by mouth daily. 01/27/16   Burgess Estelle, MD  azelastine (ASTELIN) 0.1 % nasal spray USE 1 SPRAY IN EACH NOSTRIL TWICE DAILY AS NEEDED FOR RHINITIS Patient not taking: Reported on 08/24/2018 05/25/18   Neva Seat, MD  Blood Glucose Monitoring Suppl (ONETOUCH VERIO) w/Device KIT 1 Device by Does not apply route 2 (two) times daily. ICD 10  E11.8, non-insulin dependent, test up to twice daily Patient not taking: Reported on 08/24/2018 09/30/16   Burgess Estelle, MD  Budesonide 90 MCG/ACT inhaler Inhale 1-2 puffs into the lungs 2 (two) times daily. Patient not taking: Reported on 08/24/2018 09/30/16   Oval Linsey, MD  calcium gluconate 500 MG tablet Take 1 tablet (500 mg total) by mouth daily. 04/12/18   [name], MD  cholecalciferol (VITAMIN D) 1000 units tablet Take 1 tablet (1,000 Units total) by mouth daily. 01/27/16   Burgess Estelle, MD  citalopram (CELEXA) 20 MG tablet Take 20 mg by mouth daily. 10/25/14   [provider]  diclofenac sodium (VOLTAREN) 1 % GEL APPLY 4 GRAMS EXTERNALLY TO THE  AFFECTED AREA FOUR TIMES DAILY AS NEEDED FOR PAIN 05/25/18   Neva Seat, MD  gabapentin (NEURONTIN) 300 MG capsule TAKE 2 CAPSULES(600 MG) BY MOUTH TWICE DAILY 07/28/18   Neva Seat, MD  GENVOYA 150-150-200-10 MG TABS tablet TAKE 1 TABLET BY MOUTH DAILY WITH BREAKFAST 07/01/18   Michel Bickers, MD  glucose blood [facility] VERIO) test strip Use as instructed ICD 10  E11.8, non-insulin dependent, test up to twice daily 09/30/16   Burgess Estelle, MD  hydroxypropyl methylcellulose / hypromellose (ISOPTO TEARS / GONIOVISC) 2.5 % ophthalmic solution Place 1 drop into both eyes as needed for dry eyes.    [provider]  hydrOXYzine (VISTARIL) 50 MG capsule Take 50 mg by mouth at bedtime.  01/21/18   [provider]  lisinopril (PRINIVIL,ZESTRIL) 2.5 MG tablet Take 1 tablet (2.5 mg total) by mouth daily. 05/26/18   Neva Seat, MD  metFORMIN (  GLUCOPHAGE) 500 MG tablet TAKE 1 TABLET BY MOUTH EVERY DAY WITH BREAKFAST 07/28/18   Neva Seat, MD  [facility] DELICA LANCETS FINE MISC 1 Stick by Does not apply route 2 (two) times daily. ICD 10  E11.8, non-insulin dependent, test up to twice daily 09/30/16   Burgess Estelle, MD  oxyCODONE 10 MG TABS Take 1 tablet (10 mg total) by mouth every 6 (six) hours as needed for severe pain or breakthrough pain. Patient not taking: Reported on 08/24/2018 03/01/18   Molt, Bethany, DO  polyethylene glycol (MIRALAX / GLYCOLAX) packet Take 17 g by mouth daily. 03/02/18   Molt, Bethany, DO  pravastatin (PRAVACHOL) 40 MG tablet TAKE 1 TABLET(40 MG) BY MOUTH DAILY 06/30/18   Neva Seat, MD  predniSONE (DELTASONE) 5 MG tablet TAKE 1 TABLET(5 MG) BY MOUTH DAILY WITH BREAKFAST 06/30/18   [name], MD  PREZISTA 800 MG tablet TAKE 1 TABLET BY MOUTH EVERY DAY WITH BREAKFAST 07/01/18   Michel Bickers, MD  QUEtiapine (SEROQUEL) 300 MG tablet Take 300 mg by mouth 2 (two) times daily.  11/11/15   [provider]  sucralfate (CARAFATE) 1 g  tablet Take 1 tablet (1 g total) by mouth 4 (four) times daily. 04/18/18   Kyung Rudd, MD  tamsulosin (FLOMAX) 0.4 MG CAPS capsule TAKE 1 CAPSULE(0.4 MG) BY MOUTH DAILY AFTER BREAKFAST Patient not taking: Reported on 08/24/2018 01/25/18   Neva Seat, MD  Tiotropium Bromide-Olodaterol (STIOLTO RESPIMAT) 2.5-2.5 MCG/ACT AERS Inhale 2 puffs into the lungs daily. 12/27/17   Neva Seat, MD  valACYclovir (VALTREX) 500 MG tablet TAKE 1 TABLET BY MOUTH DAILY 08/29/18   Michel Bickers, MD   Physical Exam: Vitals:   08/31/18 1015 08/31/18 1030 08/31/18 1045 08/31/18 1122  BP:      Pulse: (!) 116 (!) 119 (!) 119   Resp: (!) 30 (!) 29 20   Temp:    (!) 101.9 F (38.8 C)  TempSrc:    Rectal  SpO2: 100% 100% 100%   Weight:      Height:        General exam: Moderately built and nourished patient, lying comfortably supine on the gurney in no obvious distress.  Head, eyes and ENT: Nontraumatic and normocephalic. Pupils equally reacting to light and accommodation. Oral mucosa moist.  Neck: Supple. No JVD, carotid bruit or thyromegaly.  Lymphatics: No lymphadenopathy.  Respiratory system: rare rales heard, chest congestion heard, exp wheezes. Moderate increased work of breathing.  Cardiovascular system: normal S1 and S2 heard, tachycardic. No JVD, murmurs, gallops, clicks or pedal edema.  Gastrointestinal system: Abdomen is nondistended, soft and nontender. Normal bowel sounds heard. No organomegaly or masses appreciated.  Central nervous system: Alert and oriented. No focal neurological deficits.  Extremities: Symmetric 5 x 5 power. Peripheral pulses symmetrically felt.   Skin: No rashes or acute findings.  Musculoskeletal system: Negative exam.  Psychiatry: Pleasant and cooperative.  Labs on Admission:  Basic Metabolic Panel: Recent Labs  Lab 08/31/18 0814  NA 138  K 3.3*  CL 106  CO2 21*  GLUCOSE 164*  BUN 16  CREATININE 1.34*  CALCIUM 8.7*   Liver Function  Tests: Recent Labs  Lab 08/31/18 0814  AST 19  ALT 9  ALKPHOS 78  BILITOT 0.3  PROT 6.7  ALBUMIN 2.9*   No results for input(s): LIPASE, AMYLASE in the last 168 hours. No results for input(s): AMMONIA in the last 168 hours. CBC: Recent Labs  Lab 08/31/18 0814  WBC 6.9  NEUTROABS 6.2  HGB 9.4*  HCT 29.9*  MCV 86.2  PLT 255   Cardiac Enzymes: No results for input(s): CKTOTAL, CKMB, CKMBINDEX, TROPONINI in the last 168 hours.  BNP (last 3 results) No results for input(s): PROBNP in the last 8760 hours. CBG: No results for input(s): GLUCAP in the last 168 hours.  Radiological Exams on Admission: Dg Chest Port 1 View  Result Date: 08/31/2018 CLINICAL DATA:  Shortness of breath, fever. EXAM: PORTABLE CHEST 1 VIEW COMPARISON:  Radiographs of September 21, 2016. FINDINGS: Stable cardiomediastinal silhouette. No pneumothorax is noted. Left lung is clear. New large right perihilar and basilar airspace opacity is noted most consistent with pneumonia. No significant pleural effusion is noted. Bony thorax is unremarkable. IMPRESSION: New large right perihilar and basilar airspace opacity is noted most consistent with pneumonia. Followup PA and lateral chest X-ray is recommended in 3-4 weeks following trial of antibiotic therapy to ensure resolution and exclude underlying malignancy. Electronically Signed   By: Marijo Conception, M.D.   On: 08/31/2018 08:53   EKG: Independently reviewed. Sinus tachycardia   Assessment/Plan Principal Problem:   Acute respiratory failure with hypoxia (HCC) Active Problems:   HIV disease (HCC)   Smoking greater than 30 pack years   Hypertension   Chronic Low Back Pain   History of CVA   CKD (chronic kidney disease) stage 3, GFR 30-59 ml/min (HCC)   COPD, moderate (HCC)   Diabetes mellitus treated with oral medication ([place])   Hyperlipidemia   Substance abuse (HCC)   BPH associated with nocturia   History of prostate cancer   Chronic anticoagulation    Prostate cancer metastatic to multiple sites [facility])   Closed fracture of proximal end of left humerus with delayed healing   Severe sepsis ([place])  1. Acute respiratory failure with hypoxia-likely secondary to pneumonia.  He is currently on BiPAP therapy and he is clinically improving.  Continue supportive therapy with BiPAP and continue IV antibiotics supportive therapies.  He is at high risk for requiring intubation and he will be monitored closely in the stepdown unit. 2. Right pneumonia -continue IV antibiotics and supportive therapy with close monitoring in the stepdown unit for possible decompensation.  Follow-up blood cultures. 3. Severe sepsis-secondary to pneumonia.  He was bolused with IV fluids and his lactate is trending down we will continue to monitor his lactic acid levels and continue supportive therapy. 4. HIV disease- continue home medications.  He was recently seen by ID Dr. Megan Salon and his CD4 and T-cell counts were recently tested. 5. COPD with exacerbation- treating with scheduled nebs and steroids. 6. Tobacco abuse-this is a longtime smoker who has not been interested in quitting.  We will try to counsel him when he is feeling better.  Will provide a nicotine patch as needed for cravings. 7. Diabetes mellitus type 2- expect some hyperglycemia as he is on IV steroids and will treat with supplemental sliding scale insulin and prandial insulin.  Adjust and titrate doses as needed for better glycemic control. 8. BPH- in and out cath as needed if unable to void. 9. Stage III CKD-we will monitor renal function regularly. 10. Essential hypertension-holding antihypertensives at this time. 11. Chronic full anticoagulation-we will continue apixaban for full anticoagulation. 12. Sinus tachycardia - secondary to sepsis, dehydration --> treating supportively. Follow telemetry.  13. Metastatic prostate cancer - continue zytiga and resume oral prednisone when off IV steroids.   DVT  Prophylaxis: Apixaban Code Status: Full Family Communication: Wife at bedside  Disposition Plan: Intensive treatments in the stepdown unit  Critical care Time spent: 60 mins  Paula Busenbark Wynetta Emery, MD [facility] How to contact the [facility] Attending or Consulting provider [place] or covering provider during after hours [place], for this patient?  1. Check the care team in [facility] and look for a) attending/consulting [facility] provider listed and b) the [facility] team listed 2. Log into [email or web address] and use [place] universal password to access. If you do not have the password, please contact the hospital operator. 3. Locate the [facility] provider you are looking for under [facility] and page to a number that you can be directly reached. 4. If you still have difficulty reaching the provider, please page the [facility] (Director on Call) for the Hospitalists listed on amion for assistance.

## 2018-08-31 NOTE — ED Provider Notes (Signed)
[facility] EMERGENCY DEPARTMENT Provider Note   CSN: [id number] Arrival date & time: 08/31/18  0754     History   Chief Complaint Chief Complaint  Patient presents with  . Respiratory Distress    HPI Jacob Joseph is a 63 y.o. male.  Patient brought in by EMS for respiratory distress.  Patient is a history of HIV on antivirals followed by infectious disease at [facility] in [place].  Also has a history of prostatic cancer metastatic to multiple sites.  Followed by hematology oncology for that.  Also known to have tuberculosis of the long latent.  Also known to have a history of COPD.  Has been a cocaine abuser recently.  Patient arrived 100% nonrebreather.  Oxygen sats on that were only 87% at best.  Patient was tachycardic he was tachypneic he was using accessory muscles for breathing.  He was mentating he was talking.  Patient stated that he felt fine until last evening when he started to get sick.  Patient's wife eventually arrived and stated that he had severe breathing problems this morning was kind of out of it she is called EMS.     Past Medical History:  Diagnosis Date  . AKI (acute kidney injury) ([place]) 09/24/2016  . Calcium oxalate crystals in urine 12/23/2011   Asymptomatic, no hematuria. Advised to take plenty of water.  [name] Kitchen COPD (chronic obstructive pulmonary disease) ([place])   . Depression   . Diabetes mellitus without complication ([place]) 3976   pre diabetic  . Hepatitis C   . HIV (human immunodeficiency virus infection) ([place])   . Hypertension   . PE (pulmonary thromboembolism) ([place]) 05/12/2016   PE in 2017, on chronic anticoagulation for this and recurrent DVT.  [name] Kitchen Peripheral arterial disease ([place])    ooccluded left SFA by duplex ultrasound, tibial disease left greater than right  . Prostate cancer metastatic to multiple sites [facility])   . Stroke ([place]) 11/2011   Carotids Doppler negative. Right sided weakness resolved, initially on Plavix for 3 months and then continued  with ASA.    . TB lung, latent 1988   Treated    Patient Active Problem List   Diagnosis Date Noted  . Closed fracture of proximal end of left humerus with delayed healing 05/26/2018  . 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 Acute deep vein thrombosis (DVT) of right popliteal vein ([place])   . Prostate cancer metastatic to multiple sites [facility])   . Radicular pain of thoracic region   . Bone lesion   . Calculus of gallbladder without cholecystitis without obstruction   . Chronic anticoagulation   . Right upper quadrant pain 02/26/2018  . Abnormal computed tomography of thoracic spine 02/26/2018  . Recurrent falls 11/02/2016  . History of prostate cancer 09/22/2016  . BPH associated with nocturia 06/19/2016  . Substance abuse ([place]) 01/27/2016  . Vitamin D deficiency 12/05/2015  . Hyperlipidemia 01/28/2015  . Cataracts, bilateral 08/29/2014  . Diabetes mellitus treated with oral medication ([place]) 10/19/2013  . COPD, moderate ([place]) 05/31/2013  . Complex Lt Renal Cyst 03/22/2013  . Preventive measure 05/30/2012  . History of CVA 03/08/2012  . Normocytic anemia 03/08/2012  . CKD (chronic kidney disease) stage 3, GFR 30-59 ml/min (H[facility]) 03/08/2012  . ERECTILE DYSFUNCTION 04/25/2008  . HIV disease ([place]) 04/27/2006  . Smoking greater than 30 pack years 04/27/2006  . Major depressive disorder, recurrent episode ([place]) 04/27/2006  . Hypertension 04/27/2006  . DEGENERATIVE JOINT DISEASE 04/27/2006  . Chronic Low Back Pain  04/27/2006    Past Surgical History:  Procedure Laterality Date  . HERNIA REPAIR    . INGUINAL HERNIA REPAIR  01/16/2012   Procedure: HERNIA REPAIR INGUINAL INCARCERATED;  Surgeon: Zenovia Jarred, MD;  Location: [place];  Service: General;  Laterality: Right;  . TEE WITHOUT CARDIOVERSION  12/07/2011   Procedure: TRANSESOPHAGEAL ECHOCARDIOGRAM (TEE);  Surgeon: Birdie Riddle, MD;  Location: [facility] ENDOSCOPY;  Service: Cardiovascular;  Laterality: N/A;        Home  Medications    Prior to Admission medications   Medication Sig Start Date End Date Taking? Authorizing Provider  abiraterone acetate (ZYTIGA) 250 MG tablet TAKE 4 TABLETS (1,000 MG TOTAL) BY MOUTH DAILY. TAKE ON AN EMPTY STOMACH 1 HOUR BEFORE OR 2 HOURS AFTER A MEAL 08/15/18   [name], MD  albuterol (PROAIR HFA) 108 (90 Base) MCG/ACT inhaler INHALE 2 PUFFS INTO THE LUNGS EVERY 6 HOURS AS NEEDED FOR WHEEZING OR SHORTNESS OF BREATH, TO NOT USE EVERY DAY, USE ONLY IF NEEDED Patient not taking: Reported on 08/24/2018 01/07/17   Axel Filler, MD  apixaban (ELIQUIS) 2.5 MG TABS tablet Take 1 tablet (2.5 mg total) by mouth 2 (two) times daily. 04/28/18   Neva Seat, MD  aspirin 81 MG EC tablet Take 1 tablet (81 mg total) by mouth daily. 01/27/16   Burgess Estelle, MD  azelastine (ASTELIN) 0.1 % nasal spray USE 1 SPRAY IN EACH NOSTRIL TWICE DAILY AS NEEDED FOR RHINITIS Patient not taking: Reported on 08/24/2018 05/25/18   Neva Seat, MD  Blood Glucose Monitoring Suppl (ONETOUCH VERIO) w/Device KIT 1 Device by Does not apply route 2 (two) times daily. ICD 10  E11.8, non-insulin dependent, test up to twice daily Patient not taking: Reported on 08/24/2018 09/30/16   Burgess Estelle, MD  Budesonide 90 MCG/ACT inhaler Inhale 1-2 puffs into the lungs 2 (two) times daily. Patient not taking: Reported on 08/24/2018 09/30/16   Oval Linsey, MD  calcium gluconate 500 MG tablet Take 1 tablet (500 mg total) by mouth daily. 04/12/18   [name], MD  cholecalciferol (VITAMIN D) 1000 units tablet Take 1 tablet (1,000 Units total) by mouth daily. 01/27/16   Burgess Estelle, MD  citalopram (CELEXA) 20 MG tablet Take 20 mg by mouth daily. 10/25/14   [provider]  diclofenac sodium (VOLTAREN) 1 % GEL APPLY 4 GRAMS EXTERNALLY TO THE AFFECTED AREA FOUR TIMES DAILY AS NEEDED FOR PAIN 05/25/18   Neva Seat, MD  gabapentin (NEURONTIN) 300 MG capsule TAKE 2 CAPSULES(600 MG) BY MOUTH TWICE  DAILY 07/28/18   Neva Seat, MD  GENVOYA 150-150-200-10 MG TABS tablet TAKE 1 TABLET BY MOUTH DAILY WITH BREAKFAST 07/01/18   Michel Bickers, MD  glucose blood [facility] VERIO) test strip Use as instructed ICD 10  E11.8, non-insulin dependent, test up to twice daily 09/30/16   Burgess Estelle, MD  hydroxypropyl methylcellulose / hypromellose (ISOPTO TEARS / GONIOVISC) 2.5 % ophthalmic solution Place 1 drop into both eyes as needed for dry eyes.    [provider]  hydrOXYzine (VISTARIL) 50 MG capsule Take 50 mg by mouth at bedtime.  01/21/18   [provider]  lisinopril (PRINIVIL,ZESTRIL) 2.5 MG tablet Take 1 tablet (2.5 mg total) by mouth daily. 05/26/18   Neva Seat, MD  metFORMIN (GLUCOPHAGE) 500 MG tablet TAKE 1 TABLET BY MOUTH EVERY DAY WITH BREAKFAST 07/28/18   Neva Seat, MD  [facility] DELICA LANCETS FINE MISC 1 Stick by Does not apply route 2 (  two) times daily. ICD 10  E11.8, non-insulin dependent, test up to twice daily 09/30/16   Burgess Estelle, MD  oxyCODONE 10 MG TABS Take 1 tablet (10 mg total) by mouth every 6 (six) hours as needed for severe pain or breakthrough pain. Patient not taking: Reported on 08/24/2018 03/01/18   Molt, Bethany, DO  polyethylene glycol (MIRALAX / GLYCOLAX) packet Take 17 g by mouth daily. 03/02/18   Molt, Bethany, DO  pravastatin (PRAVACHOL) 40 MG tablet TAKE 1 TABLET(40 MG) BY MOUTH DAILY 06/30/18   Neva Seat, MD  predniSONE (DELTASONE) 5 MG tablet TAKE 1 TABLET(5 MG) BY MOUTH DAILY WITH BREAKFAST 06/30/18   [name], MD  PREZISTA 800 MG tablet TAKE 1 TABLET BY MOUTH EVERY DAY WITH BREAKFAST 07/01/18   Michel Bickers, MD  QUEtiapine (SEROQUEL) 300 MG tablet Take 300 mg by mouth 2 (two) times daily.  11/11/15   [provider]  sucralfate (CARAFATE) 1 g tablet Take 1 tablet (1 g total) by mouth 4 (four) times daily. 04/18/18   Kyung Rudd, MD  tamsulosin (FLOMAX) 0.4 MG CAPS capsule TAKE 1 CAPSULE(0.4 MG) BY MOUTH  DAILY AFTER BREAKFAST Patient not taking: Reported on 08/24/2018 01/25/18   Neva Seat, MD  Tiotropium Bromide-Olodaterol (STIOLTO RESPIMAT) 2.5-2.5 MCG/ACT AERS Inhale 2 puffs into the lungs daily. 12/27/17   Neva Seat, MD  valACYclovir (VALTREX) 500 MG tablet TAKE 1 TABLET BY MOUTH DAILY 08/29/18   Michel Bickers, MD    Family History Family History  Problem Relation Age of Onset  . Hypertension Mother   . Heart attack Mother   . Stroke Mother   . Hypertension Father   . Cancer Father     Social History Social History   Tobacco Use  . Smoking status: Current Every Day Smoker    Packs/day: 0.45    Types: Cigarettes    Last attempt to quit: 11/18/2014    Years since quitting: 3.7  . Smokeless tobacco: Never Used  . Tobacco comment: .5 PPD  Substance Use Topics  . Alcohol use: No    Alcohol/week: 0.0 standard drinks  . Drug use: Yes    Frequency: 3.0 times per week    Types: "Crack" cocaine, Cocaine    Comment: last times saturday     Allergies   Statins   Review of Systems Review of Systems  Constitutional: Positive for fever. Negative for chills.  HENT: Negative for rhinorrhea and sore throat.   Eyes: Negative for visual disturbance.  Respiratory: Positive for cough and shortness of breath.   Cardiovascular: Negative for chest pain and leg swelling.  Gastrointestinal: Negative for abdominal pain, diarrhea, nausea and vomiting.  Genitourinary: Negative for dysuria.  Musculoskeletal: Positive for myalgias. Negative for back pain and neck pain.  Skin: Negative for rash.  Allergic/Immunologic: Positive for immunocompromised state.  Neurological: Negative for dizziness, light-headedness and headaches.  Hematological: Does not bruise/bleed easily.  Psychiatric/Behavioral: Negative for confusion.     Physical Exam Updated Vital Signs BP 93/76   Pulse (!) 119   Temp (!) 103 F (39.4 C)   Resp 20   Ht 1.829 m (6')   Wt 99.8 kg   SpO2 100%   BMI  29.84 kg/m   Physical Exam Vitals signs and nursing note reviewed.  Constitutional:      General: He is in acute distress.     Appearance: He is well-developed. He is ill-appearing. He is not diaphoretic.  HENT:     Head: Normocephalic and  atraumatic.     Mouth/Throat:     Mouth: Mucous membranes are dry.  Eyes:     Extraocular Movements: Extraocular movements intact.     Conjunctiva/sclera: Conjunctivae normal.     Pupils: Pupils are equal, round, and reactive to light.  Neck:     Musculoskeletal: Neck supple.  Cardiovascular:     Rate and Rhythm: Regular rhythm. Tachycardia present.     Heart sounds: No murmur.  Pulmonary:     Effort: Respiratory distress present.     Breath sounds: Normal breath sounds. No wheezing.  Abdominal:     General: Bowel sounds are normal.     Palpations: Abdomen is soft.     Tenderness: There is no abdominal tenderness.  Musculoskeletal: Normal range of motion.        General: No swelling.  Skin:    General: Skin is warm and dry.  Neurological:     General: No focal deficit present.     Mental Status: He is alert.      ED Treatments / Results  Labs (all labs ordered are listed, but only abnormal results are displayed) Labs Reviewed  LACTIC ACID, PLASMA - Abnormal; Notable for the following components:      Result Value   Lactic Acid, Venous 5.1 (*)    All other components within normal limits  COMPREHENSIVE METABOLIC PANEL - Abnormal; Notable for the following components:   Potassium 3.3 (*)    CO2 21 (*)    Glucose, Bld 164 (*)    Creatinine, Ser 1.34 (*)    Calcium 8.7 (*)    Albumin 2.9 (*)    GFR calc non Af Amer 57 (*)    All other components within normal limits  CBC WITH DIFFERENTIAL/PLATELET - Abnormal; Notable for the following components:   RBC 3.47 (*)    Hemoglobin 9.4 (*)    HCT 29.9 (*)    Lymphs Abs 0.5 (*)    All other components within normal limits  BLOOD GAS, ARTERIAL - Abnormal; Notable for the following  components:   pH, Arterial 7.312 (*)    pCO2 arterial 50.2 (*)    pO2, Arterial 47.0 (*)    All other components within normal limits  BLOOD GAS, ARTERIAL - Abnormal; Notable for the following components:   pO2, Arterial 149 (*)    All other components within normal limits  CULTURE, BLOOD (ROUTINE X 2)  CULTURE, BLOOD (ROUTINE X 2)  RESPIRATORY PANEL BY PCR  LACTIC ACID, PLASMA  URINALYSIS, ROUTINE W REFLEX MICROSCOPIC    EKG EKG Interpretation  Date/Time:  Wednesday August 31 2018 07:59:37 EST Ventricular Rate:  147 PR Interval:    QRS Duration: 91 QT Interval:  320 QTC Calculation: 501 R Axis:   -61 Text Interpretation:  Sinus tachycardia Ventricular premature complex Aberrant conduction of SV complex(es) Probable left atrial enlargement Inferior infarct, old Prolonged QT interval Confirmed by Fredia Sorrow [phone]) on 08/31/2018 8:17:08 AM   Radiology Dg Chest Port 1 View  Result Date: 08/31/2018 CLINICAL DATA:  Shortness of breath, fever. EXAM: PORTABLE CHEST 1 VIEW COMPARISON:  Radiographs of September 21, 2016. FINDINGS: Stable cardiomediastinal silhouette. No pneumothorax is noted. Left lung is clear. New large right perihilar and basilar airspace opacity is noted most consistent with pneumonia. No significant pleural effusion is noted. Bony thorax is unremarkable. IMPRESSION: New large right perihilar and basilar airspace opacity is noted most consistent with pneumonia. Followup PA and lateral chest X-ray is recommended in 3-4 weeks  following trial of antibiotic therapy to ensure resolution and exclude underlying malignancy. Electronically Signed   By: Marijo Conception, M.D.   On: 08/31/2018 08:53    Procedures Procedures (including critical care time) CRITICAL CARE Performed by: Fredia Sorrow Total critical care time: 60 minutes Critical care time was exclusive of separately billable procedures and treating other patients. Critical care was necessary to treat or  prevent imminent or life-threatening deterioration. Critical care was time spent personally by me on the following activities: development of treatment plan with patient and/or surrogate as well as nursing, discussions with consultants, evaluation of patient's response to treatment, examination of patient, obtaining history from patient or surrogate, ordering and performing treatments and interventions, ordering and review of laboratory studies, ordering and review of radiographic studies, pulse oximetry and re-evaluation of patient's condition.  Medications Ordered in ED Medications  metroNIDAZOLE (FLAGYL) IVPB 500 mg (0 mg Intravenous Stopped 08/31/18 1055)  vancomycin (VANCOCIN) 2,000 mg in sodium chloride 0.9 % 500 mL IVPB (2,000 mg Intravenous New Bag/Given 08/31/18 0920)  ceFEPIme (MAXIPIME) 2 g in sodium chloride 0.9 % 100 mL IVPB (has no administration in time range)  vancomycin (VANCOCIN) 1,250 mg in sodium chloride 0.9 % 250 mL IVPB (has no administration in time range)  ceFEPIme (MAXIPIME) 2 g in sodium chloride 0.9 % 100 mL IVPB (0 g Intravenous Stopped 08/31/18 0919)  sodium chloride 0.9 % bolus 1,000 mL (0 mLs Intravenous Stopped 08/31/18 1047)    And  sodium chloride 0.9 % bolus 1,000 mL (1,000 mLs Intravenous New Bag/Given 08/31/18 0833)    And  sodium chloride 0.9 % bolus 1,000 mL (1,000 mLs Intravenous New Bag/Given 08/31/18 0828)     Initial Impression / Assessment and Plan / ED Course  I have reviewed the triage vital signs and the nursing notes.  Pertinent labs & imaging results that were available during my care of the patient were reviewed by me and considered in my medical decision making (see chart for details).    Patient came in in respiratory distress.  Some 100% nonrebreather by EMS.  But still was satting around 87%.  Patient switched over to BiPAP.  Oxygen level started to improve patient stated he felt more comfortable.  Patient has a history of HIV.  Patient states  he has been taking his antivirals.  And his wife confirmed that.  According to his wife patient just got sick last evening was fine up to that point in time.  She knows he was having significant difficulty breathing today and she called EMS.  Chest x-ray consistent with a right-sided pneumonia.  Due to his immunocompromise state this will be treated as H CAP.  But patient met septic criteria so he was started on broad-spectrum antibiotics before we even had the chest x-ray.  Patient was to receive 3 L of fluid although he was never hypotensive to the third liter his heart rate improved some down to around the 115 range but blood pressure systolic which have been marginal sometimes in the upper 80s sometimes low 90s came very nicely up above 100.  Patient continue to feel comfortable on the BiPAP.  But did have increased respiratory rate and the tachycardia.  But it did show signs of improvement.  Arterial blood gas done on the BiPAP showed good pH good PCO2 and good PO2.  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  With the 30 cc/kg fluids his lactic acid came down into the  3 range.  Significant improvement.  Discussed with hospitalist who will admit patient here to stepdown ICU unit here at [facility].  Patient could wear out overnight.  But for now and is mentating and talking appropriate to stay on BiPAP.   Final Clinical Impressions(s) / ED Diagnoses   Final diagnoses:  HCAP (healthcare-associated pneumonia)  Acute respiratory failure with hypoxia ([place])  Sepsis, due to unspecified organism, unspecified whether acute organ dysfunction present [facility])    ED Discharge Orders    None       Fredia Sorrow, MD 08/31/18 1551

## 2018-08-31 NOTE — ED Notes (Signed)
CRITICAL VALUE ALERT  Critical Value:  Lactic 3.2  Date & Time Notied:  08/31/2018, 1131  Provider Notified: Dr. Rogene Houston  Orders Received/Actions taken: see chart

## 2018-08-31 NOTE — ED Notes (Signed)
Date and time results received: 08/31/18 1616 (use smartphrase ".now" to insert current time)  Test: Lactic Acid Critical Value: 2.3 Name of Provider Notified:Dr Wynetta Emery Orders Received? Or Actions Taken?: NA

## 2018-08-31 NOTE — ED Triage Notes (Signed)
Pt brought in by EMS due to breathing difficulties since yesterday. Sats 85 on NRB at 15L.

## 2018-08-31 NOTE — ED Notes (Signed)
EDP at bedside  

## 2018-09-01 ENCOUNTER — Encounter: Payer: Self-pay | Admitting: Internal Medicine

## 2018-09-01 DIAGNOSIS — D72829 Elevated white blood cell count, unspecified: Secondary | ICD-10-CM | POA: Diagnosis not present

## 2018-09-01 DIAGNOSIS — S42295G Other nondisplaced fracture of upper end of left humerus, subsequent encounter for fracture with delayed healing: Secondary | ICD-10-CM

## 2018-09-01 HISTORY — DX: Elevated white blood cell count, unspecified: D72.829

## 2018-09-01 LAB — COMPREHENSIVE METABOLIC PANEL
ALBUMIN: 2.6 g/dL — AB (ref 3.5–5.0)
ALT: 8 U/L (ref 0–44)
AST: 18 U/L (ref 15–41)
Alkaline Phosphatase: 54 U/L (ref 38–126)
Anion gap: 7 (ref 5–15)
BUN: 20 mg/dL (ref 8–23)
CO2: 20 mmol/L — ABNORMAL LOW (ref 22–32)
CREATININE: 1.31 mg/dL — AB (ref 0.61–1.24)
Calcium: 8.3 mg/dL — ABNORMAL LOW (ref 8.9–10.3)
Chloride: 113 mmol/L — ABNORMAL HIGH (ref 98–111)
GFR calc Af Amer: >60 mL/min (ref >60)
GFR calc non Af Amer: 58 mL/min — ABNORMAL LOW (ref >60)
GLUCOSE: 165 mg/dL — AB (ref 70–99)
Potassium: 5.2 mmol/L — ABNORMAL HIGH (ref 3.5–5.1)
Sodium: 140 mmol/L (ref 135–145)
Total Bilirubin: 0.5 mg/dL (ref 0.3–1.2)
Total Protein: 6.7 g/dL (ref 6.5–8.1)

## 2018-09-01 LAB — CBC WITH DIFFERENTIAL/PLATELET
Abs Immature Granulocytes: 0.28 10*3/uL — ABNORMAL HIGH (ref 0.00–0.07)
BASOS ABS: 0 10*3/uL (ref 0.0–0.1)
Basophils Relative: 0 %
Eosinophils Absolute: 0 10*3/uL (ref 0.0–0.5)
Eosinophils Relative: 0 %
HCT: 26.6 % — ABNORMAL LOW (ref 39.0–52.0)
Hemoglobin: 8.1 g/dL — ABNORMAL LOW (ref 13.0–17.0)
Immature Granulocytes: 1 %
LYMPHS ABS: 0.4 10*3/uL — AB (ref 0.7–4.0)
Lymphocytes Relative: 2 %
MCH: 26.6 pg (ref 26.0–34.0)
MCHC: 30.5 g/dL (ref 30.0–36.0)
MCV: 87.5 fL (ref 80.0–100.0)
Monocytes Absolute: 0.6 10*3/uL (ref 0.1–1.0)
Monocytes Relative: 3 %
Neutro Abs: 18.8 10*3/uL — ABNORMAL HIGH (ref 1.7–7.7)
Neutrophils Relative %: 94 %
Platelets: 208 10*3/uL (ref 150–400)
RBC: 3.04 MIL/uL — ABNORMAL LOW (ref 4.22–5.81)
RDW: 15.5 % (ref 11.5–15.5)
WBC: 20.1 10*3/uL — ABNORMAL HIGH (ref 4.0–10.5)
nRBC: 0 % (ref 0.0–0.2)

## 2018-09-01 LAB — GLUCOSE, CAPILLARY
Glucose-Capillary: 171 mg/dL — ABNORMAL HIGH (ref 70–99)
Glucose-Capillary: 185 mg/dL — ABNORMAL HIGH (ref 70–99)
Glucose-Capillary: 243 mg/dL — ABNORMAL HIGH (ref 70–99)
Glucose-Capillary: 182 mg/dL — ABNORMAL HIGH (ref 70–99)
Glucose-Capillary: 174 mg/dL — ABNORMAL HIGH (ref 70–99)

## 2018-09-01 LAB — LEGIONELLA PNEUMOPHILA SEROGP 1 UR AG: L. pneumophila Serogp 1 Ur Ag: NEGATIVE

## 2018-09-01 LAB — MRSA PCR SCREENING: MRSA by PCR: NEGATIVE

## 2018-09-01 LAB — MAGNESIUM: Magnesium: 1.8 mg/dL (ref 1.7–2.4)

## 2018-09-01 MED ORDER — SODIUM CHLORIDE 0.9 % IV SOLN
INTRAVENOUS | Status: DC
  Administered 2018-09-01 – 2018-09-03 (×3): via INTRAVENOUS

## 2018-09-01 MED ORDER — PREDNISONE 20 MG PO TABS
30.0000 mg | ORAL_TABLET | Freq: Every day | ORAL | Status: DC
  Administered 2018-09-02: 30 mg via ORAL
  Filled 2018-09-01 (×2): qty 1

## 2018-09-01 NOTE — Progress Notes (Signed)
**Note De-Identified  Obfuscation** Patient removed from BIPAP and placed on 10 L HFNC: tolerating well at thins time.  RRT to continue to monitor.

## 2018-09-01 NOTE — Progress Notes (Signed)
PROGRESS NOTE  Jacob Joseph  IRJ:[id number]  DOB: 25-Aug-1955  DOA: 08/31/2018 PCP: Neva Seat, MD  Brief Admission Hx:  63 y.o. male with complex past medical history including HIV, COPD, longtime smoker, history of CVA, type 2 diabetes mellitus, hep C, metastatic prostate cancer, chronic anticoagulation, history of PE and DVT, substance abuse and other history detailed below who presented to the emergency department with abrupt onset of shortness of breath.    MDM/Assessment & Plan:   1. Acute respiratory failure with hypoxia-likely secondary to pneumonia.  He is currently being weaned off BiPAP.  He is clinically improving.  Continue supportive therapy with BiPAP and continue IV antibiotics supportive therapies.  He will be monitored closely in the stepdown unit. 2. Right peri-hilar pneumonia -continue IV antibiotics and supportive therapy with close monitoring in the stepdown unit for possible decompensation.  DC vancomycin as MRSA screen is negative.  Follow-up blood cultures.  He seems to be clinically improving.  I would like to keep him in the stepdown unit for 1 additional day for monitoring in case he needs more BiPAP. 3. Severe sepsis-secondary to pneumonia.  He was bolused with IV fluids and his lactate is trending down we will continue to monitor his lactic acid levels and continue supportive therapy. 4. HIV disease- continue home medications.  He was recently seen by ID Dr. Megan Salon and his CD4 and T-cell counts were recently tested. 5. COPD with exacerbation- treating with scheduled nebs and steroids.  Weaning down steroids and now is on oral prednisone starting 09/02/2018 with breakfast. 6. Leukocytosis-secondary to IV steroid use.  Check CBC in a.m. 7. Tobacco abuse-this is a longtime smoker who has not been interested in quitting.    I have counseled him to consider stopping all tobacco use and explained to him the dangers of ongoing usage. Will provide a nicotine patch as  needed for cravings. 8. Diabetes mellitus type 2- expect some hyperglycemia as he is on IV steroids and will treat with supplemental sliding scale insulin and prandial insulin.  Adjust and titrate doses as needed for better glycemic control. 9. BPH- in and out cath as needed if unable to void.  He has been urinating without difficulty so far. 10. Stage III CKD-we will monitor renal function regularly. 11. Essential hypertension-holding antihypertensives at this time. 12. Chronic full anticoagulation-we will continue apixaban for full anticoagulation. 13. Sinus tachycardia - secondary to sepsis, dehydration --> treating supportively. Follow telemetry.  14. Metastatic prostate cancer - continue zytiga and resume oral prednisone when off IV steroids.   DVT Prophylaxis: Apixaban Code Status: Full Family Communication: Wife at bedside Disposition Plan: Intensive treatments in the stepdown unit  Consultants:  N/a   Procedures:  N/a   Antimicrobials:  Cefepime 2/12 >>  Vancomycin 2/12 >>   Subjective: Patient seen on BiPAP.  He reports that he is feeling a little better today.  He has no chest pain or shortness of breath.  Objective: Vitals:   09/01/18 0006 09/01/18 0252 09/01/18 0400 09/01/18 0500  BP:   (!) 104/58 115/71  Pulse: (!) 103  (!) 105 (!) 102  Resp: (!) 28  (!) 21 (!) 23  Temp:   97.9 F (36.6 C)   TempSrc:   Axillary   SpO2: 99% 100% 95% 99%  Weight:    100.4 kg  Height:        Intake/Output Summary (Last 24 hours) at 09/01/2018 3016 Last data filed at 09/01/2018 0510 Gross per 24 hour  Intake  912.08 ml  Output 650 ml  Net 262.08 ml   Filed Weights   08/31/18 0757 08/31/18 1658 09/01/18 0500  Weight: 99.8 kg 100.1 kg 100.4 kg     REVIEW OF SYSTEMS  As per history otherwise all reviewed and reported negative  Exam:  General exam: Awake, alert, no apparent distress.  Seen on BiPAP. Respiratory system: Diminished breath sounds right lower lobe with  breath sounds. No increased work of breathing.  Rare expiratory wheezes heard. Cardiovascular system: S1 & S2 heard.  Cardiac.  No JVD, murmurs, gallops, clicks or pedal edema. Gastrointestinal system: Abdomen is nondistended, soft and nontender. Normal bowel sounds heard. Central nervous system: Alert and oriented. No focal neurological deficits. Extremities: no cyanosis or clubbing.   Data Reviewed: Basic Metabolic Panel: Recent Labs  Lab 08/31/18 0814 09/01/18 0433  NA 138 140  K 3.3* 5.2*  CL 106 113*  CO2 21* 20*  GLUCOSE 164* 165*  BUN 16 20  CREATININE 1.34* 1.31*  CALCIUM 8.7* 8.3*  MG  --  1.8   Liver Function Tests: Recent Labs  Lab 08/31/18 0814 09/01/18 0433  AST 19 18  ALT 9 8  ALKPHOS 78 54  BILITOT 0.3 0.5  PROT 6.7 6.7  ALBUMIN 2.9* 2.6*   No results for input(s): LIPASE, AMYLASE in the last 168 hours. No results for input(s): AMMONIA in the last 168 hours. CBC: Recent Labs  Lab 08/31/18 0814 09/01/18 0433  WBC 6.9 20.1*  NEUTROABS 6.2 18.8*  HGB 9.4* 8.1*  HCT 29.9* 26.6*  MCV 86.2 87.5  PLT 255 208   Cardiac Enzymes: No results for input(s): CKTOTAL, CKMB, CKMBINDEX, TROPONINI in the last 168 hours. CBG (last 3)  Recent Labs    08/31/18 2117 09/01/18 0249 09/01/18 0755  GLUCAP 154* 171* 185*   Recent Results (from the past 240 hour(s))  Respiratory Panel by PCR     Status: None   Collection Time: 08/31/18  8:09 AM  Result Value Ref Range Status   Adenovirus NOT DETECTED NOT DETECTED Final   Coronavirus 229E NOT DETECTED NOT DETECTED Final    Comment: (NOTE) The Coronavirus on the Respiratory Panel, DOES NOT test for the novel  Coronavirus (2019 nCoV)    Coronavirus HKU1 NOT DETECTED NOT DETECTED Final   Coronavirus NL63 NOT DETECTED NOT DETECTED Final   Coronavirus OC43 NOT DETECTED NOT DETECTED Final   Metapneumovirus NOT DETECTED NOT DETECTED Final   Rhinovirus / Enterovirus NOT DETECTED NOT DETECTED Final   Influenza A NOT  DETECTED NOT DETECTED Final   Influenza B NOT DETECTED NOT DETECTED Final   Parainfluenza Virus 1 NOT DETECTED NOT DETECTED Final   Parainfluenza Virus 2 NOT DETECTED NOT DETECTED Final   Parainfluenza Virus 3 NOT DETECTED NOT DETECTED Final   Parainfluenza Virus 4 NOT DETECTED NOT DETECTED Final   Respiratory Syncytial Virus NOT DETECTED NOT DETECTED Final   Bordetella pertussis NOT DETECTED NOT DETECTED Final   Chlamydophila pneumoniae NOT DETECTED NOT DETECTED Final   Mycoplasma pneumoniae NOT DETECTED NOT DETECTED Final    Comment: Performed at [facility], [place]. [address], [place], [place] [address]  Blood Culture (routine x 2)     Status: None (Preliminary result)   Collection Time: 08/31/18  8:14 AM  Result Value Ref Range Status   Specimen Description RIGHT ANTECUBITAL  Final   Special Requests   Final    BOTTLES DRAWN AEROBIC AND ANAEROBIC Blood Culture adequate volume   Culture  Final    NO GROWTH < 24 HOURS Performed at [facility], [address] [place], [place] [address]    Report Status PENDING  Incomplete  Blood Culture (routine x 2)     Status: None (Preliminary result)   Collection Time: 08/31/18  8:24 AM  Result Value Ref Range Status   Specimen Description BLOOD LEFT HAND  Final   Special Requests   Final    BOTTLES DRAWN AEROBIC AND ANAEROBIC Blood Culture adequate volume   Culture   Final    NO GROWTH < 24 HOURS Performed at [facility], [address], [place], [place] [address]    Report Status PENDING  Incomplete  MRSA PCR Screening     Status: None   Collection Time: 08/31/18  4:49 PM  Result Value Ref Range Status   MRSA by PCR NEGATIVE NEGATIVE Final    Comment:        The GeneXpert MRSA Assay (FDA approved for NASAL specimens only), is one component of a comprehensive MRSA colonization surveillance program. It is not intended to diagnose MRSA infection nor to guide or monitor treatment for MRSA infections. Performed at [facility], [address] [place], [place] [address]      Studies: Dg Chest [facility] 1 View  Result Date: 08/31/2018 CLINICAL DATA:  Shortness of breath, fever. EXAM: PORTABLE CHEST 1 VIEW COMPARISON:  Radiographs of September 21, 2016. FINDINGS: Stable cardiomediastinal silhouette. No pneumothorax is noted. Left lung is clear. New large right perihilar and basilar airspace opacity is noted most consistent with pneumonia. No significant pleural effusion is noted. Bony thorax is unremarkable. IMPRESSION: New large right perihilar and basilar airspace opacity is noted most consistent with pneumonia. Followup PA and lateral chest X-ray is recommended in 3-4 weeks following trial of antibiotic therapy to ensure resolution and exclude underlying malignancy. Electronically Signed   By: Marijo Conception, M.D.   On: 08/31/2018 08:53     Scheduled Meds: . abiraterone acetate  1,000 mg Oral Daily  . apixaban  2.5 mg Oral BID  . aspirin EC  81 mg Oral Daily  . chlorhexidine  15 mL Mouth Rinse BID  . citalopram  20 mg Oral Daily  . darunavir  800 mg Oral Q breakfast  . elvitegravir-cobicistat-emtricitabine-tenofovir  1 tablet Oral Q breakfast  . gabapentin  300 mg Oral BID  . guaiFENesin  1,200 mg Oral BID  . hydrOXYzine  50 mg Oral QHS  . insulin aspart  0-20 Units Subcutaneous TID WC  . insulin aspart  0-5 Units Subcutaneous QHS  . insulin aspart  6 Units Subcutaneous TID WC  . ipratropium-albuterol  3 mL Nebulization Q6H  . mouth rinse  15 mL Mouth Rinse q12n4p  . methylPREDNISolone (SOLU-MEDROL) injection  60 mg Intravenous Q12H  . nicotine  21 mg Transdermal Daily  . pravastatin  40 mg Oral q1800  . [START ON 09/02/2018] predniSONE  30 mg Oral Q breakfast  . QUEtiapine  300 mg Oral BID  . sucralfate  1 g Oral QID  . valACYclovir  500 mg Oral Daily   Continuous Infusions: . sodium chloride 50 mL/hr at 09/01/18 0717  . ceFEPime (MAXIPIME) IV 2 g (09/01/18 0839)  . vancomycin Stopped (08/31/18 2346)     Principal Problem:   Acute respiratory failure with hypoxia (HCC) Active Problems:   HIV disease (HCC)   Smoking greater than 30 pack years   Hypertension   Chronic Low Back Pain   History of  CVA   CKD (chronic kidney disease) stage 3, GFR 30-59 ml/min (HCC)   COPD, moderate (HCC)   Diabetes mellitus treated with oral medication ([place])   Hyperlipidemia   Substance abuse ([place])   BPH associated with nocturia   History of prostate cancer   Chronic anticoagulation   Prostate cancer metastatic to multiple sites [facility])   Closed fracture of proximal end of left humerus with delayed healing   Severe sepsis ([place])   Leukocytosis  Critical care time spent: 32 minutes  Irwin Brakeman, MD [facility] 09/01/2018, 8:52 AM    LOS: 1 day  How to contact the [facility] Attending or Consulting provider [place] or covering provider during after hours [place], for this patient?  1. Check the care team in [facility] and look for a) attending/consulting [facility] provider listed and b) the [facility] team listed 2. Log into [email or web address] and use [place]'s universal password to access. If you do not have the password, please contact the hospital operator. 3. Locate the [facility] provider you are looking for under [facility] and page to a number that you can be directly reached. 4. If you still have difficulty reaching the provider, please page the [facility] (Director on Call) for the Hospitalists listed on amion for assistance.

## 2018-09-02 LAB — GLUCOSE, CAPILLARY
GLUCOSE-CAPILLARY: 247 mg/dL — AB (ref 70–99)
GLUCOSE-CAPILLARY: 139 mg/dL — AB (ref 70–99)
Glucose-Capillary: 178 mg/dL — ABNORMAL HIGH (ref 70–99)
Glucose-Capillary: 211 mg/dL — ABNORMAL HIGH (ref 70–99)
Glucose-Capillary: 187 mg/dL — ABNORMAL HIGH (ref 70–99)

## 2018-09-02 LAB — CBC WITH DIFFERENTIAL/PLATELET
ABS IMMATURE GRANULOCYTES: 1.96 10*3/uL — AB (ref 0.00–0.07)
BASOS ABS: 0 10*3/uL (ref 0.0–0.1)
Basophils Relative: 0 %
Eosinophils Absolute: 0.1 10*3/uL (ref 0.0–0.5)
Eosinophils Relative: 0 %
HCT: 24.7 % — ABNORMAL LOW (ref 39.0–52.0)
Hemoglobin: 7.9 g/dL — ABNORMAL LOW (ref 13.0–17.0)
Immature Granulocytes: 9 %
Lymphocytes Relative: 2 %
Lymphs Abs: 0.4 10*3/uL — ABNORMAL LOW (ref 0.7–4.0)
MCH: 27.6 pg (ref 26.0–34.0)
MCHC: 32 g/dL (ref 30.0–36.0)
MCV: 86.4 fL (ref 80.0–100.0)
Monocytes Absolute: 0.8 10*3/uL (ref 0.1–1.0)
Monocytes Relative: 4 %
Neutro Abs: 18 10*3/uL — ABNORMAL HIGH (ref 1.7–7.7)
Neutrophils Relative %: 85 %
Platelets: 188 10*3/uL (ref 150–400)
RBC: 2.86 MIL/uL — AB (ref 4.22–5.81)
RDW: 15.5 % (ref 11.5–15.5)
WBC Morphology: INCREASED
WBC: 21.2 10*3/uL — AB (ref 4.0–10.5)
nRBC: 0 % (ref 0.0–0.2)

## 2018-09-02 LAB — COMPREHENSIVE METABOLIC PANEL
ALT: 11 U/L (ref 0–44)
AST: 19 U/L (ref 15–41)
Albumin: 2.4 g/dL — ABNORMAL LOW (ref 3.5–5.0)
Alkaline Phosphatase: 54 U/L (ref 38–126)
Anion gap: 8 (ref 5–15)
BUN: 28 mg/dL — ABNORMAL HIGH (ref 8–23)
CO2: 18 mmol/L — AB (ref 22–32)
Calcium: 8.4 mg/dL — ABNORMAL LOW (ref 8.9–10.3)
Chloride: 111 mmol/L (ref 98–111)
Creatinine, Ser: 1.19 mg/dL (ref 0.61–1.24)
GFR calc non Af Amer: >60 mL/min (ref >60)
Glucose, Bld: 217 mg/dL — ABNORMAL HIGH (ref 70–99)
Potassium: 4.3 mmol/L (ref 3.5–5.1)
Sodium: 137 mmol/L (ref 135–145)
Total Bilirubin: 0.2 mg/dL — ABNORMAL LOW (ref 0.3–1.2)
Total Protein: 6.7 g/dL (ref 6.5–8.1)

## 2018-09-02 LAB — MAGNESIUM: Magnesium: 2.2 mg/dL (ref 1.7–2.4)

## 2018-09-02 MED ORDER — PREDNISONE 20 MG PO TABS
20.0000 mg | ORAL_TABLET | Freq: Every day | ORAL | Status: DC
  Administered 2018-09-03: 20 mg via ORAL
  Filled 2018-09-02: qty 1

## 2018-09-02 NOTE — Evaluation (Signed)
Physical Therapy Evaluation Patient Details Name: Jacob Joseph MRN: [id number] DOB: 04-29-1956 Today's Date: 09/02/2018   History of Present Illness  Jacob Joseph is a 63 y.o. male with complex past medical history including HIV, COPD, longtime smoker, history of CVA, type 2 diabetes mellitus, hep C, metastatic prostate cancer, chronic anticoagulation, history of PE and DVT, substance abuse and other history detailed below who presented to the emergency department with abrupt onset of shortness of breath.  The symptoms started yesterday.  The patient had been doing fairly well today.  He had seen his ID doctor last week and was doing well.  EMS had him on nonrebreather and he was satting at 85%.  When he arrived in the emergency department he was immediately placed on BiPAP with improvement in symptoms.  Patient was able to speak after he was on BiPAP therapy and denied having chest pain.  He continued to have shortness of breath.  He was also noted to be wheezing and coughing with tachypnea.  His wife reports that he only noticed difficulty with breathing in the past several hours.  There are no known sick contacts.    Clinical Impression  Patient functioning near baseline for functional mobility and gait, demonstrates slightly labored movement for sit to stands, transfers and ambulation in hallway without loss of balance, limited secondary to fatigue and mild SOB with O2 saturation remaining between 90-91% while walking, decreased to 88% once sitting in chair.  Patient will benefit from continued physical therapy in hospital and recommended venue below to increase strength, balance, endurance for safe ADLs and gait.     Follow Up Recommendations Supervision - Intermittent    Equipment Recommendations  Rolling walker with 5" wheels;3in1 (PT)(Rollator with 4 wheels, seat and brakes)    Recommendations for Other Services       Precautions / Restrictions Precautions Precautions:  Fall Restrictions Weight Bearing Restrictions: No      Mobility  Bed Mobility Overal bed mobility: Modified Independent             General bed mobility comments: increased time  Transfers Overall transfer level: Needs assistance Equipment used: Rolling walker (2 wheeled) Transfers: Sit to/from [facility] Sit to Stand: Min guard Stand pivot transfers: Supervision       General transfer comment: verbal cues for proper hand placement during sit to stands  Ambulation/Gait Ambulation/Gait assistance: Supervision Gait Distance (Feet): 80 Feet Assistive device: Rolling walker (2 wheeled) Gait Pattern/deviations: Decreased step length - right;Decreased stride length Gait velocity: decreased   General Gait Details: slightly labored slow cadence without loss of balance, on room air with O2 saturation between 90-91%  Stairs            Wheelchair Mobility    Modified Rankin (Stroke Patients Only)       Balance Overall balance assessment: Needs assistance Sitting-balance support: Feet supported;No upper extremity supported Sitting balance-Leahy Scale: Good     Standing balance support: During functional activity;No upper extremity supported Standing balance-Leahy Scale: Fair Standing balance comment: fair/good using RW                             Pertinent Vitals/Pain Pain Assessment: No/denies pain    Home Living Family/patient expects to be discharged to:: Private residence Living Arrangements: Spouse/significant other Available Help at Discharge: Family;Available 24 hours/day Type of Home: Apartment Home Access: Stairs to enter Entrance Stairs-Rails: Right;Left;Can reach both Entrance Stairs-Number of Steps: 3  Home Layout: One level Home Equipment: Cane - single point Additional Comments: lost most of DME after moving out of last apartment, was thrown away per patient    Prior Function Level of Independence: Independent  with assistive device(s)         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, drives     Hand Dominance   Dominant Hand: Right    Extremity/Trunk Assessment   Upper Extremity Assessment Upper Extremity Assessment: Generalized weakness    Lower Extremity Assessment Lower Extremity Assessment: Generalized weakness    Cervical / Trunk Assessment Cervical / Trunk Assessment: Normal  Communication   Communication: No difficulties  Cognition Arousal/Alertness: Awake/alert Behavior During Therapy: WFL for tasks assessed/performed Overall Cognitive Status: Within Functional Limits for tasks assessed                                        General Comments      Exercises     Assessment/Plan    PT Assessment Patient needs continued PT services  PT Problem List Decreased strength;Decreased activity tolerance;Decreased balance;Decreased mobility       PT Treatment Interventions Gait training;Stair training;Functional mobility training;Therapeutic activities;Patient/family education;Therapeutic exercise    PT Goals (Current goals can be found in the Care Plan section)  Acute Rehab PT Goals Patient Stated Goal: return home PT Goal Formulation: With patient Time For Goal Achievement: 09/09/18 Potential to Achieve Goals: Good    Frequency Min 3X/week   Barriers to discharge        Co-evaluation               AM-PAC PT "6 Clicks" Mobility  Outcome Measure Help needed turning from your back to your side while in a flat bed without using bedrails?: None Help needed moving from lying on your back to sitting on the side of a flat bed without using bedrails?: None Help needed moving to and from a bed to a chair (including a wheelchair)?: A Little Help needed standing up from a chair using your arms (e.g., wheelchair or bedside chair)?: A Little Help needed to walk in hospital room?: A Little Help needed climbing 3-5 steps with a railing? : A Little 6  Click Score: 20    End of Session   Activity Tolerance: Patient tolerated treatment well;Patient limited by fatigue Patient left: in chair;with call bell/phone within reach Nurse Communication: Mobility status PT Visit Diagnosis: Unsteadiness on feet (R26.81);Other abnormalities of gait and mobility (R26.89);Muscle weakness (generalized) (M62.81)    Time: 4403-4742 PT Time Calculation (min) (ACUTE ONLY): 31 min   Charges:   PT Evaluation $PT Eval Moderate Complexity: 1 Mod PT Treatments $Therapeutic Activity: 23-37 mins        3:58 PM, 09/02/18 Lonell Grandchild, MPT Physical Therapist with [facility] [phone] office [phone] mobile phone

## 2018-09-02 NOTE — Plan of Care (Signed)
  Problem: Acute Rehab PT Goals(only PT should resolve) Goal: Pt Will Go Supine/Side To Sit Outcome: Progressing Flowsheets (Taken 09/02/2018 1559) Pt will go Supine/Side to Sit: Independently Goal: Patient Will Transfer Sit To/From Stand Outcome: Progressing Flowsheets (Taken 09/02/2018 1559) Patient will transfer sit to/from stand: with modified independence Goal: Pt Will Transfer Bed To Chair/Chair To Bed Outcome: Progressing Flowsheets (Taken 09/02/2018 1559) Pt will Transfer Bed to Chair/Chair to Bed: with modified independence Goal: Pt Will Ambulate Outcome: Progressing Flowsheets (Taken 09/02/2018 1559) Pt will Ambulate: > 125 feet; with modified independence; with rolling walker   4:00 PM, 09/02/18 Lonell Grandchild, MPT Physical Therapist with [facility] [phone] office [phone] mobile phone

## 2018-09-02 NOTE — Care Management Important Message (Signed)
Important Message  Patient Details  Name: Jacob Joseph MRN: [id number] Date of Birth: 04/07/1956   Medicare Important Message Given:  Yes    Tommy Medal 09/02/2018, 11:13 AM

## 2018-09-02 NOTE — Progress Notes (Signed)
PROGRESS NOTE  Jacob Joseph  XLK:[id number]  DOB: Oct 18, 1955  DOA: 08/31/2018 PCP: Neva Seat, MD  Brief Admission Hx:  63 y.o. male with complex past medical history including HIV, COPD, longtime smoker, history of CVA, type 2 diabetes mellitus, hep C, metastatic prostate cancer, chronic anticoagulation, history of PE and DVT, substance abuse and other history detailed below who presented to the emergency department with abrupt onset of shortness of breath.    MDM/Assessment & Plan:   1. Acute respiratory failure with hypoxia-likely secondary to pneumonia.  He is weaned off BiPAP.  He is clinically improving.  Continue supportive therapy with BiPAP and continue IV antibiotics supportive therapies.  Transfer to telemetry.  2. Right peri-hilar pneumonia -continue IV antibiotics and supportive therapy with close monitoring in the stepdown unit for possible decompensation.  DC vancomycin as MRSA screen is negative.  Follow-up blood cultures.  He seems to be clinically improving. 3. Severe sepsis-RESOLVED.  Secondary to dense pneumonia.  He was bolused with IV fluids and his lactate is trended down. 4. HIV disease- continue home medications.  He was recently seen by ID Dr. Megan Salon and his CD4 and T-cell counts were recently tested. 5. COPD with exacerbation- treating with scheduled nebs and steroids.  Weaning down steroids and now is on oral prednisone starting 09/02/2018 with breakfast.  Plan to get him back on his home dose of 5 mg daily of prednisone. 6. Leukocytosis-secondary to IV steroid use.  7. Tobacco abuse-this is a longtime smoker who has not been interested in quitting.    I have counseled him to consider stopping all tobacco use and explained to him the dangers of ongoing usage. Will provide a nicotine patch as needed for cravings. 8. Diabetes mellitus type 2- expect some hyperglycemia as he is on IV steroids and will treat with supplemental sliding scale insulin and prandial  insulin.  Adjust and titrate doses as needed for better glycemic control. 9. BPH- in and out cath as needed if unable to void.  He has been urinating without difficulty so far. 10. Stage III CKD-we will monitor renal function regularly. 11. Essential hypertension-holding antihypertensives at this time. 12. Chronic full anticoagulation-we will continue apixaban for full anticoagulation. 13. Sinus tachycardia - secondary to sepsis, dehydration --> treating supportively. Follow telemetry.  14. Metastatic prostate cancer - continue zytiga and resume oral prednisone when off IV steroids.   DVT Prophylaxis: Apixaban Code Status: Full Family Communication: Wife at bedside Disposition Plan: Intensive treatments in the stepdown unit  Consultants:  N/a   Procedures:  N/a   Antimicrobials:  Cefepime 2/12 >>  Vancomycin 2/12 >>   Subjective: Patient seen on BiPAP.  He reports that he is feeling a little better today.  He has no chest pain or shortness of breath.  Objective: Vitals:   09/02/18 0740 09/02/18 0800 09/02/18 0900 09/02/18 1100  BP:  (!) 145/93 (!) 153/92   Pulse:  100 (!) 102   Resp:  (!) 23 (!) 25   Temp: 98.6 F (37 C)   98.7 F (37.1 C)  TempSrc: Oral   Oral  SpO2:  97% 96%   Weight:      Height:        Intake/Output Summary (Last 24 hours) at 09/02/2018 1225 Last data filed at 09/02/2018 0912 Gross per 24 hour  Intake 1842.9 ml  Output 3400 ml  Net -1557.1 ml   Filed Weights   08/31/18 1658 09/01/18 0500 09/02/18 0500  Weight: 100.1 kg 100.4 kg  102 kg    REVIEW OF SYSTEMS  As per history otherwise all reviewed and reported negative  Exam:  General exam: Awake, alert, no apparent distress.  Seen on BiPAP. Respiratory system: Diminished breath sounds right lower lobe with breath sounds. No increased work of breathing.  Rare expiratory wheezes heard. Cardiovascular system: S1 & S2 heard.  Cardiac.  No JVD, murmurs, gallops, clicks or pedal  edema. Gastrointestinal system: Abdomen is nondistended, soft and nontender. Normal bowel sounds heard. Central nervous system: Alert and oriented. No focal neurological deficits. Extremities: no cyanosis or clubbing.   Data Reviewed: Basic Metabolic Panel: Recent Labs  Lab 08/31/18 0814 09/01/18 0433 09/02/18 0501  NA 138 140 137  K 3.3* 5.2* 4.3  CL 106 113* 111  CO2 21* 20* 18*  GLUCOSE 164* 165* 217*  BUN 16 20 28*  CREATININE 1.34* 1.31* 1.19  CALCIUM 8.7* 8.3* 8.4*  MG  --  1.8 2.2   Liver Function Tests: Recent Labs  Lab 08/31/18 0814 09/01/18 0433 09/02/18 0501  AST 19 18 19   ALT 9 8 11   ALKPHOS 78 54 54  BILITOT 0.3 0.5 0.2*  PROT 6.7 6.7 6.7  ALBUMIN 2.9* 2.6* 2.4*   No results for input(s): LIPASE, AMYLASE in the last 168 hours. No results for input(s): AMMONIA in the last 168 hours. CBC: Recent Labs  Lab 08/31/18 0814 09/01/18 0433 09/02/18 0501  WBC 6.9 20.1* 21.2*  NEUTROABS 6.2 18.8* 18.0*  HGB 9.4* 8.1* 7.9*  HCT 29.9* 26.6* 24.7*  MCV 86.2 87.5 86.4  PLT 255 208 188   Cardiac Enzymes: No results for input(s): CKTOTAL, CKMB, CKMBINDEX, TROPONINI in the last 168 hours. CBG (last 3)  Recent Labs    09/02/18 0413 09/02/18 0728 09/02/18 1109  GLUCAP 247* 178* 211*   Recent Results (from the past 240 hour(s))  Respiratory Panel by PCR     Status: None   Collection Time: 08/31/18  8:09 AM  Result Value Ref Range Status   Adenovirus NOT DETECTED NOT DETECTED Final   Coronavirus 229E NOT DETECTED NOT DETECTED Final    Comment: (NOTE) The Coronavirus on the Respiratory Panel, DOES NOT test for the novel  Coronavirus (2019 nCoV)    Coronavirus HKU1 NOT DETECTED NOT DETECTED Final   Coronavirus NL63 NOT DETECTED NOT DETECTED Final   Coronavirus OC43 NOT DETECTED NOT DETECTED Final   Metapneumovirus NOT DETECTED NOT DETECTED Final   Rhinovirus / Enterovirus NOT DETECTED NOT DETECTED Final   Influenza A NOT DETECTED NOT DETECTED Final    Influenza B NOT DETECTED NOT DETECTED Final   Parainfluenza Virus 1 NOT DETECTED NOT DETECTED Final   Parainfluenza Virus 2 NOT DETECTED NOT DETECTED Final   Parainfluenza Virus 3 NOT DETECTED NOT DETECTED Final   Parainfluenza Virus 4 NOT DETECTED NOT DETECTED Final   Respiratory Syncytial Virus NOT DETECTED NOT DETECTED Final   Bordetella pertussis NOT DETECTED NOT DETECTED Final   Chlamydophila pneumoniae NOT DETECTED NOT DETECTED Final   Mycoplasma pneumoniae NOT DETECTED NOT DETECTED Final    Comment: Performed at [facility], [place] [address] [place], [place] [address]  Blood Culture (routine x 2)     Status: None (Preliminary result)   Collection Time: 08/31/18  8:14 AM  Result Value Ref Range Status   Specimen Description RIGHT ANTECUBITAL  Final   Special Requests   Final    BOTTLES DRAWN AEROBIC AND ANAEROBIC Blood Culture adequate volume   Culture   Final  NO GROWTH 2 DAYS Performed at [facility], [address], [place], [place] [address]    Report Status PENDING  Incomplete  Blood Culture (routine x 2)     Status: None (Preliminary result)   Collection Time: 08/31/18  8:24 AM  Result Value Ref Range Status   Specimen Description BLOOD LEFT HAND  Final   Special Requests   Final    BOTTLES DRAWN AEROBIC AND ANAEROBIC Blood Culture adequate volume   Culture   Final    NO GROWTH 2 DAYS Performed at [facility], [address] [place], [place] [address]    Report Status PENDING  Incomplete  MRSA PCR Screening     Status: None   Collection Time: 08/31/18  4:49 PM  Result Value Ref Range Status   MRSA by PCR NEGATIVE NEGATIVE Final    Comment:        The GeneXpert MRSA Assay (FDA approved for NASAL specimens only), is one component of a comprehensive MRSA colonization surveillance program. It is not intended to diagnose MRSA infection nor to guide or monitor treatment for MRSA infections. Performed at [facility], [address] [place], [place]  [address]      Studies: No results found.   Scheduled Meds: . abiraterone acetate  1,000 mg Oral Daily  . apixaban  2.5 mg Oral BID  . aspirin EC  81 mg Oral Daily  . chlorhexidine  15 mL Mouth Rinse BID  . citalopram  20 mg Oral Daily  . darunavir  800 mg Oral Q breakfast  . elvitegravir-cobicistat-emtricitabine-tenofovir  1 tablet Oral Q breakfast  . gabapentin  300 mg Oral BID  . guaiFENesin  1,200 mg Oral BID  . hydrOXYzine  50 mg Oral QHS  . insulin aspart  0-20 Units Subcutaneous TID WC  . insulin aspart  0-5 Units Subcutaneous QHS  . insulin aspart  6 Units Subcutaneous TID WC  . ipratropium-albuterol  3 mL Nebulization Q6H  . mouth rinse  15 mL Mouth Rinse q12n4p  . nicotine  21 mg Transdermal Daily  . pravastatin  40 mg Oral q1800  . [START ON 09/03/2018] predniSONE  20 mg Oral Q breakfast  . QUEtiapine  300 mg Oral BID  . sucralfate  1 g Oral QID  . valACYclovir  500 mg Oral Daily   Continuous Infusions: . sodium chloride Stopped (09/02/18 0845)  . ceFEPime (MAXIPIME) IV 200 mL/hr at 09/02/18 4709    Principal Problem:   Acute respiratory failure with hypoxia (HCC) Active Problems:   HIV disease (HCC)   Smoking greater than 30 pack years   Hypertension   Chronic Low Back Pain   History of CVA   CKD (chronic kidney disease) stage 3, GFR 30-59 ml/min (HCC)   COPD, moderate (HCC)   Diabetes mellitus treated with oral medication ([place])   Hyperlipidemia   Substance abuse (HCC)   BPH associated with nocturia   History of prostate cancer   Chronic anticoagulation   Prostate cancer metastatic to multiple sites [facility])   Closed fracture of proximal end of left humerus with delayed healing   Severe sepsis ([place])   Leukocytosis  Critical care time spent: 31 minutes  Irwin Brakeman, MD [facility] 09/02/2018, 12:25 PM    LOS: 2 days  How to contact the [facility] Attending or Consulting provider [place] or covering provider during after hours 7P -7A, for this  patient?  1. Check the care team in [facility] and look for a)  attending/consulting [facility] provider listed and b) the [facility] team listed 2. Log into [email or web address] and use [place] universal password to access. If you do not have the password, please contact the hospital operator. 3. Locate the [facility] provider you are looking for under [facility] and page to a number that you can be directly reached. 4. If you still have difficulty reaching the provider, please page the [facility] (Director on Call) for the Hospitalists listed on amion for assistance.

## 2018-09-02 NOTE — Care Management Note (Signed)
Case Management Note  Patient Details  Name: Linell Shawn MRN: [id number] Date of Birth: 1956-06-18  Subjective/Objective:   Acute respiratory failure with hypoxia. From home with wife. Currently on room air. Walked with PT And saturation maintained in 90's. Recommended for [facility] PT, rollator, and shower chair. Agreeable. Elects [facility].                 Action/Plan: [facility] of [facility] notified and will obtain orders via Epic. Will call patient and go over price for shower chair. Patient has ordered one through his insurance but will not receive until April.   Expected Discharge Date:     09/02/18             Expected Discharge Plan:  [place]  In-House Referral:     Discharge planning Services  CM Consult  Post Acute Care Choice:  Home Health, Durable Medical Equipment Choice offered to:  Patient  DME Arranged:  Walker rolling with seat, Shower stool DME Agency:  [place]:  PT [place]:  [place]  Status of Service:  Completed, signed off  If discussed at [place] of Stay Meetings, dates discussed:    Additional Comments:  Jacquilyn Seldon, Chauncey [place], RN 09/02/2018, 3:28 PM

## 2018-09-03 DIAGNOSIS — J441 Chronic obstructive pulmonary disease with (acute) exacerbation: Secondary | ICD-10-CM

## 2018-09-03 DIAGNOSIS — J189 Pneumonia, unspecified organism: Secondary | ICD-10-CM

## 2018-09-03 LAB — COMPREHENSIVE METABOLIC PANEL
ALK PHOS: 52 U/L (ref 38–126)
ALT: 10 U/L (ref 0–44)
ANION GAP: 9 (ref 5–15)
AST: 16 U/L (ref 15–41)
Albumin: 2.7 g/dL — ABNORMAL LOW (ref 3.5–5.0)
BUN: 31 mg/dL — ABNORMAL HIGH (ref 8–23)
CALCIUM: 8.6 mg/dL — AB (ref 8.9–10.3)
CO2: 18 mmol/L — ABNORMAL LOW (ref 22–32)
Chloride: 110 mmol/L (ref 98–111)
Creatinine, Ser: 1.14 mg/dL (ref 0.61–1.24)
GFR calc Af Amer: >60 mL/min (ref >60)
GFR calc non Af Amer: >60 mL/min (ref >60)
Glucose, Bld: 101 mg/dL — ABNORMAL HIGH (ref 70–99)
Potassium: 3.7 mmol/L (ref 3.5–5.1)
Sodium: 137 mmol/L (ref 135–145)
Total Bilirubin: 0.3 mg/dL (ref 0.3–1.2)
Total Protein: 6.9 g/dL (ref 6.5–8.1)

## 2018-09-03 LAB — CBC WITH DIFFERENTIAL/PLATELET
Abs Immature Granulocytes: 0.46 10*3/uL — ABNORMAL HIGH (ref 0.00–0.07)
Basophils Absolute: 0 10*3/uL (ref 0.0–0.1)
Basophils Relative: 0 %
Eosinophils Absolute: 0 10*3/uL (ref 0.0–0.5)
Eosinophils Relative: 0 %
HCT: 24.6 % — ABNORMAL LOW (ref 39.0–52.0)
Hemoglobin: 7.8 g/dL — ABNORMAL LOW (ref 13.0–17.0)
Immature Granulocytes: 2 %
Lymphocytes Relative: 5 %
Lymphs Abs: 1 10*3/uL (ref 0.7–4.0)
MCH: 26.9 pg (ref 26.0–34.0)
MCHC: 31.7 g/dL (ref 30.0–36.0)
MCV: 84.8 fL (ref 80.0–100.0)
Monocytes Absolute: 0.7 10*3/uL (ref 0.1–1.0)
Monocytes Relative: 4 %
Neutro Abs: 18.3 10*3/uL — ABNORMAL HIGH (ref 1.7–7.7)
Neutrophils Relative %: 89 %
Platelets: 185 10*3/uL (ref 150–400)
RBC: 2.9 MIL/uL — ABNORMAL LOW (ref 4.22–5.81)
RDW: 15.4 % (ref 11.5–15.5)
WBC: 20.5 10*3/uL — ABNORMAL HIGH (ref 4.0–10.5)
nRBC: 0.1 % (ref 0.0–0.2)

## 2018-09-03 LAB — MAGNESIUM: Magnesium: 2.2 mg/dL (ref 1.7–2.4)

## 2018-09-03 LAB — GLUCOSE, CAPILLARY
GLUCOSE-CAPILLARY: 145 mg/dL — AB (ref 70–99)
GLUCOSE-CAPILLARY: 96 mg/dL (ref 70–99)
Glucose-Capillary: 138 mg/dL — ABNORMAL HIGH (ref 70–99)

## 2018-09-03 MED ORDER — BUDESONIDE 90 MCG/ACT IN AEPB: 2.0000 | INHALATION_SPRAY | Freq: Two times a day (BID) | RESPIRATORY_TRACT | 3 refills | Status: DC

## 2018-09-03 MED ORDER — AZITHROMYCIN 500 MG PO TABS: 500.0000 mg | ORAL_TABLET | Freq: Every day | ORAL | 0 refills | Status: AC

## 2018-09-03 MED ORDER — NICOTINE 21 MG/24HR TD PT24: 21.0000 mg | MEDICATED_PATCH | Freq: Every day | TRANSDERMAL | 0 refills | Status: DC

## 2018-09-03 MED ORDER — GUAIFENESIN ER 600 MG PO TB12: 1200.0000 mg | ORAL_TABLET | Freq: Two times a day (BID) | ORAL | 0 refills | Status: DC

## 2018-09-03 MED ORDER — ONETOUCH VERIO W/DEVICE KIT: 1.0000 | PACK | Freq: Two times a day (BID) | 0 refills | Status: DC

## 2018-09-03 MED ORDER — TIOTROPIUM BROMIDE-OLODATEROL 2.5-2.5 MCG/ACT IN AERS: 2.0000 | INHALATION_SPRAY | Freq: Every day | RESPIRATORY_TRACT | 3 refills | Status: DC

## 2018-09-03 MED ORDER — ALBUTEROL SULFATE (2.5 MG/3ML) 0.083% IN NEBU: 2.5000 mg | INHALATION_SOLUTION | Freq: Four times a day (QID) | RESPIRATORY_TRACT | 12 refills | Status: DC | PRN

## 2018-09-03 MED ORDER — GLUCOSE BLOOD VI STRP: ORAL_STRIP | 11 refills | Status: DC

## 2018-09-03 MED ORDER — ONETOUCH DELICA LANCETS FINE MISC: 1.0000 | Freq: Two times a day (BID) | 11 refills | Status: DC

## 2018-09-03 MED ORDER — PREDNISONE 5 MG PO TABS: ORAL_TABLET | ORAL | 0 refills | Status: DC

## 2018-09-03 MED ORDER — AMOXICILLIN-POT CLAVULANATE 875-125 MG PO TABS: 1.0000 | ORAL_TABLET | Freq: Two times a day (BID) | ORAL | 0 refills | Status: AC

## 2018-09-03 NOTE — Progress Notes (Signed)
Patient Saturations on Room Air at Rest = 98%  Patient Saturations on Room Air while Ambulating = 96%  Distance Ambulated = 200 ft

## 2018-09-03 NOTE — Progress Notes (Signed)
Patient has refused CPAP at this time but is willing to wear a nasal cannula while asleep. A unit is on stand by for the patient to use if need and RT/nursing will continue to monitor the patient.

## 2018-09-03 NOTE — Progress Notes (Signed)
Pharmacy Antibiotic Note  Jacob Joseph is a 63 y.o. male admitted on 08/31/2018 with infection- unknown source.  Pharmacy has been consulted for Vancomycin/Cefepime dosing.  Plan: Cefepime 2000 mg IV every 8 hours. Monitor labs, c/s, and vanco levels as indicated.  Height: 6' (182.9 cm) Weight: 227 lb 11.8 oz (103.3 kg) IBW/kg (Calculated) : 77.6  Temp (24hrs), Avg:98.5 F (36.9 C), Min:98.1 F (36.7 C), Max:98.9 F (37.2 C)  Recent Labs  Lab 08/31/18 0814 08/31/18 1108 08/31/18 1538 09/01/18 0433 09/02/18 0501 09/03/18 0657  WBC 6.9  --   --  20.1* 21.2* 20.5*  CREATININE 1.34*  --   --  1.31* 1.19 1.14  LATICACIDVEN 5.1* 3.2* 2.3*  --   --   --     Estimated Creatinine Clearance: 84.6 mL/min (by C-G formula based on SCr of 1.14 mg/dL).    Allergies  Allergen Reactions  . Statins Other (See Comments)    Elevated liver enzymes.    Antimicrobials this admission: Vanco 2/12 >> 2/13 Cefepime 2/12 >>  Flagyl 2/12 >>2/12  Dose adjustments this admission: N/A  Microbiology results: 2/12 BCx: ngtd 2/12 MRSA PCR: negative      Thank you for allowing pharmacy to be a part of this patient's care.  Ramond Craver 09/03/2018 12:40 PM

## 2018-09-03 NOTE — Discharge Summary (Signed)
Physician Discharge Summary  Jacob Joseph GMW:[id number] DOB: 11-Aug-1955 DOA: 08/31/2018  PCP: Neva Seat, MD  Admit date: 08/31/2018 Discharge date: 09/03/2018  Admitted From: Home Disposition: Home  Recommendations for Outpatient Follow-up:  1. Follow up with PCP in 1-2 weeks 2. Please obtain BMP/CBC in one week  Discharge Condition: Stable CODE STATUS: Full code Diet recommendation: Heart healthy  Brief/Interim Summary: 63 year old male with a history of HIV, COPD, diabetes, metastatic prostate cancer, chronic anticoagulation for history of PE and DVT, presented with acute respiratory failure secondary to pneumonia.  He was initially placed on BiPAP and treated with intravenous antibiotics as well as steroids.  His overall respiratory status has improved.  He was subsequently weaned off of BiPAP and then off oxygen entirely.  He is now on room air and able to ambulate while maintaining his oxygen saturations greater than 90%.  Antibiotics have been transitioned to p.o. and he has been placed on a prednisone taper.  He is continued on bronchodilators.  He was noted to have sepsis physiology on admission secondary to pneumonia.  He was hydrated with IV fluids and lactic acid has since trended down.  He was continued on his home medications for HIV.  He is continued on prednisone taper for COPD exacerbation as well as nebulizer treatments.  He is also continued on anticoagulation with apixaban for history of DVT/PE.  He reports that he will no longer smoke after discharge and will be discharged home with nicotine patches.  Patient is otherwise stable and feels ready for discharge.  Discharge Diagnoses:  Principal Problem:   Acute respiratory failure with hypoxia (HCC) Active Problems:   HIV disease (HCC)   Smoking greater than 30 pack years   Hypertension   Chronic Low Back Pain   History of CVA   CKD (chronic kidney disease) stage 3, GFR 30-59 ml/min (HCC)   COPD, moderate  (HCC)   Diabetes mellitus treated with oral medication ([place] [facility])   Hyperlipidemia   Substance abuse (HCC)   BPH associated with nocturia   History of prostate cancer   Chronic anticoagulation   Prostate cancer metastatic to multiple sites [facility])   Closed fracture of proximal end of left humerus with delayed healing   Severe sepsis ([place])   Leukocytosis    Discharge Instructions  Discharge Instructions    Diet - low sodium heart healthy   Complete by:  As directed    Increase activity slowly   Complete by:  As directed      Allergies as of 09/03/2018      Reactions   Statins Other (See Comments)   Elevated liver enzymes.      Medication List    TAKE these medications   abiraterone acetate 250 MG tablet Commonly known as:  ZYTIGA TAKE 4 TABLETS (1,000 MG TOTAL) BY MOUTH DAILY. TAKE ON AN EMPTY STOMACH 1 HOUR BEFORE OR 2 HOURS AFTER A MEAL   albuterol 108 (90 Base) MCG/ACT inhaler Commonly known as:  PROAIR HFA INHALE 2 PUFFS INTO THE LUNGS EVERY 6 HOURS AS NEEDED FOR WHEEZING OR SHORTNESS OF BREATH, TO NOT USE EVERY DAY, USE ONLY IF NEEDED What changed:  Another medication with the same name was added. Make sure you understand how and when to take each.   albuterol (2.5 MG/3ML) 0.083% nebulizer solution Commonly known as:  PROVENTIL Take 3 mLs (2.5 mg total) by nebulization every 6 (six) hours as needed for wheezing or shortness of breath. What changed:  You were already taking  a medication with the same name, and this prescription was added. Make sure you understand how and when to take each.   amoxicillin-clavulanate 875-125 MG tablet Commonly known as:  AUGMENTIN Take 1 tablet by mouth 2 (two) times daily for 4 days.   apixaban 2.5 MG Tabs tablet Commonly known as:  ELIQUIS Take 1 tablet (2.5 mg total) by mouth 2 (two) times daily.   aspirin 81 MG EC tablet Take 1 tablet (81 mg total) by mouth daily.   azelastine 0.1 % nasal spray Commonly known as:  ASTELIN USE 1  SPRAY IN EACH NOSTRIL TWICE DAILY AS NEEDED FOR RHINITIS   azithromycin 500 MG tablet Commonly known as:  ZITHROMAX Take 1 tablet (500 mg total) by mouth daily for 4 days. Take 1 tablet daily for 3 days.   Budesonide 90 MCG/ACT inhaler Inhale 2 puffs into the lungs 2 (two) times daily. What changed:  how much to take   calcium gluconate 500 MG tablet Take 1 tablet (500 mg total) by mouth daily.   cholecalciferol 1000 units tablet Commonly known as:  VITAMIN D Take 1 tablet (1,000 Units total) by mouth daily.   citalopram 20 MG tablet Commonly known as:  CELEXA Take 20 mg by mouth daily.   diclofenac sodium 1 % Gel Commonly known as:  VOLTAREN APPLY 4 GRAMS EXTERNALLY TO THE AFFECTED AREA FOUR TIMES DAILY AS NEEDED FOR PAIN   gabapentin 300 MG capsule Commonly known as:  NEURONTIN TAKE 2 CAPSULES(600 MG) BY MOUTH TWICE DAILY   GENVOYA 150-150-200-10 MG Tabs tablet Generic drug:  elvitegravir-cobicistat-emtricitabine-tenofovir TAKE 1 TABLET BY MOUTH DAILY WITH BREAKFAST   glucose blood test strip Commonly known as:  ONETOUCH VERIO Use as instructed ICD 10  E11.8, non-insulin dependent, test up to twice daily   guaiFENesin 600 MG 12 hr tablet Commonly known as:  MUCINEX Take 2 tablets (1,200 mg total) by mouth 2 (two) times daily.   hydroxypropyl methylcellulose / hypromellose 2.5 % ophthalmic solution Commonly known as:  ISOPTO TEARS / GONIOVISC Place 1 drop into both eyes as needed for dry eyes.   hydrOXYzine 50 MG capsule Commonly known as:  VISTARIL Take 50 mg by mouth at bedtime.   lisinopril 2.5 MG tablet Commonly known as:  PRINIVIL,ZESTRIL Take 1 tablet (2.5 mg total) by mouth daily.   metFORMIN 500 MG tablet Commonly known as:  GLUCOPHAGE TAKE 1 TABLET BY MOUTH EVERY DAY WITH BREAKFAST   nicotine 21 mg/24hr patch Commonly known as:  NICODERM CQ - dosed in mg/24 hours Place 1 patch (21 mg total) onto the skin daily. Start taking on:  September 04, 1608   [facility] DELICA LANCETS FINE Misc 1 Stick by Does not apply route 2 (two) times daily. ICD 10  E11.8, non-insulin dependent, test up to twice daily   ONETOUCH VERIO w/Device Kit 1 Device by Does not apply route 2 (two) times daily. ICD 10  E11.8, non-insulin dependent, test up to twice daily   pravastatin 40 MG tablet Commonly known as:  PRAVACHOL TAKE 1 TABLET(40 MG) BY MOUTH DAILY   predniSONE 5 MG tablet Commonly known as:  DELTASONE Take '40mg'$  po daily for 2 days then '30mg'$  daily for 2 days then '20mg'$  daily for 2 days then '10mg'$  daily for 2 days then '5mg'$  daily What changed:  See the new instructions.   PREZISTA 800 MG tablet Generic drug:  darunavir TAKE 1 TABLET BY MOUTH EVERY DAY WITH BREAKFAST   QUEtiapine 300 MG tablet  Commonly known as:  SEROQUEL Take 300 mg by mouth 2 (two) times daily.   sucralfate 1 g tablet Commonly known as:  CARAFATE Take 1 tablet (1 g total) by mouth 4 (four) times daily.   tamsulosin 0.4 MG Caps capsule Commonly known as:  FLOMAX TAKE 1 CAPSULE(0.4 MG) BY MOUTH DAILY AFTER BREAKFAST   Tiotropium Bromide-Olodaterol 2.5-2.5 MCG/ACT Aers Commonly known as:  STIOLTO RESPIMAT Inhale 2 puffs into the lungs daily.   valACYclovir 500 MG tablet Commonly known as:  VALTREX TAKE 1 TABLET BY MOUTH DAILY       Allergies  Allergen Reactions  . Statins Other (See Comments)    Elevated liver enzymes.    Consultations:     Procedures/Studies: Dg Chest Port 1 View  Result Date: 08/31/2018 CLINICAL DATA:  Shortness of breath, fever. EXAM: PORTABLE CHEST 1 VIEW COMPARISON:  Radiographs of September 21, 2016. FINDINGS: Stable cardiomediastinal silhouette. No pneumothorax is noted. Left lung is clear. New large right perihilar and basilar airspace opacity is noted most consistent with pneumonia. No significant pleural effusion is noted. Bony thorax is unremarkable. IMPRESSION: New large right perihilar and basilar airspace opacity is noted most  consistent with pneumonia. Followup PA and lateral chest X-ray is recommended in 3-4 weeks following trial of antibiotic therapy to ensure resolution and exclude underlying malignancy. Electronically Signed   By: Marijo Conception, M.D.   On: 08/31/2018 08:53       Subjective: Feels that shortness of breath is improving and is approaching baseline.  Discharge Exam: Vitals:   09/03/18 0739 09/03/18 0755 09/03/18 1334 09/03/18 1419  BP: (!) 164/102  (!) 163/100   Pulse: 97  (!) 103   Resp: (!) 24  (!) 24   Temp: 98.9 F (37.2 C)  98.9 F (37.2 C)   TempSrc: Oral  Oral   SpO2: 94% 93% 94% 95%  Weight:      Height:        General: Pt is alert, awake, not in acute distress Cardiovascular: RRR, S1/S2 +, no rubs, no gallops Respiratory: Bilateral rhonchi that clear with coughing. Abdominal: Soft, NT, ND, bowel sounds + Extremities: no edema, no cyanosis    The results of significant diagnostics from this hospitalization (including imaging, microbiology, ancillary and laboratory) are listed below for reference.     Microbiology: Recent Results (from the past 240 hour(s))  Respiratory Panel by PCR     Status: None   Collection Time: 08/31/18  8:09 AM  Result Value Ref Range Status   Adenovirus NOT DETECTED NOT DETECTED Final   Coronavirus 229E NOT DETECTED NOT DETECTED Final    Comment: (NOTE) The Coronavirus on the Respiratory Panel, DOES NOT test for the novel  Coronavirus (2019 nCoV)    Coronavirus HKU1 NOT DETECTED NOT DETECTED Final   Coronavirus NL63 NOT DETECTED NOT DETECTED Final   Coronavirus OC43 NOT DETECTED NOT DETECTED Final   Metapneumovirus NOT DETECTED NOT DETECTED Final   Rhinovirus / Enterovirus NOT DETECTED NOT DETECTED Final   Influenza A NOT DETECTED NOT DETECTED Final   Influenza B NOT DETECTED NOT DETECTED Final   Parainfluenza Virus 1 NOT DETECTED NOT DETECTED Final   Parainfluenza Virus 2 NOT DETECTED NOT DETECTED Final   Parainfluenza Virus 3 NOT  DETECTED NOT DETECTED Final   Parainfluenza Virus 4 NOT DETECTED NOT DETECTED Final   Respiratory Syncytial Virus NOT DETECTED NOT DETECTED Final   Bordetella pertussis NOT DETECTED NOT DETECTED Final   Chlamydophila pneumoniae NOT  DETECTED NOT DETECTED Final   Mycoplasma pneumoniae NOT DETECTED NOT DETECTED Final    Comment: Performed at [facility], [place] [address] [place], [place] [address]  Blood Culture (routine x 2)     Status: None (Preliminary result)   Collection Time: 08/31/18  8:14 AM  Result Value Ref Range Status   Specimen Description RIGHT ANTECUBITAL  Final   Special Requests   Final    BOTTLES DRAWN AEROBIC AND ANAEROBIC Blood Culture adequate volume   Culture   Final    NO GROWTH 3 DAYS Performed at [facility], [address] [place], [place] [address]    Report Status PENDING  Incomplete  Blood Culture (routine x 2)     Status: None (Preliminary result)   Collection Time: 08/31/18  8:24 AM  Result Value Ref Range Status   Specimen Description BLOOD LEFT HAND  Final   Special Requests   Final    BOTTLES DRAWN AEROBIC AND ANAEROBIC Blood Culture adequate volume   Culture   Final    NO GROWTH 3 DAYS Performed at [facility], [address] [place], [place] [address]    Report Status PENDING  Incomplete  MRSA PCR Screening     Status: None   Collection Time: 08/31/18  4:49 PM  Result Value Ref Range Status   MRSA by PCR NEGATIVE NEGATIVE Final    Comment:        The GeneXpert MRSA Assay (FDA approved for NASAL specimens only), is one component of a comprehensive MRSA colonization surveillance program. It is not intended to diagnose MRSA infection nor to guide or monitor treatment for MRSA infections. Performed at [facility], [address], [place], [place] [address]      Labs: BNP (last 3 results) No results for input(s): BNP in the last 8760 hours. Basic Metabolic Panel: Recent Labs  Lab 08/31/18 0814 09/01/18 0433 09/02/18 0501  09/03/18 0657  NA 138 140 137 137  K 3.3* 5.2* 4.3 3.7  CL 106 113* 111 110  CO2 21* 20* 18* 18*  GLUCOSE 164* 165* 217* 101*  BUN 16 20 28* 31*  CREATININE 1.34* 1.31* 1.19 1.14  CALCIUM 8.7* 8.3* 8.4* 8.6*  MG  --  1.8 2.2 2.2   Liver Function Tests: Recent Labs  Lab 08/31/18 0814 09/01/18 0433 09/02/18 0501 09/03/18 0657  AST _0 ALT _1 ALKPHOS 78 54 54 52  BILITOT 0.3 0.5 0.2* 0.3  PROT 6.7 6.7 6.7 6.9  ALBUMIN 2.9* 2.6* 2.4* 2.7*   No results for input(s): LIPASE, AMYLASE in the last 168 hours. No results for input(s): AMMONIA in the last 168 hours. CBC: Recent Labs  Lab 08/31/18 0814 09/01/18 0433 09/02/18 0501 09/03/18 0657  WBC 6.9 20.1* 21.2* 20.5*  NEUTROABS 6.2 18.8* 18.0* 18.3*  HGB 9.4* 8.1* 7.9* 7.8*  HCT 29.9* 26.6* 24.7* 24.6*  MCV 86.2 87.5 86.4 84.8  PLT 255 208 188 185   Cardiac Enzymes: No results for input(s): CKTOTAL, CKMB, CKMBINDEX, TROPONINI in the last 168 hours. BNP: Invalid input(s): POCBNP CBG: Recent Labs  Lab 09/02/18 1612 09/02/18 2200 09/03/18 0243 09/03/18 0945 09/03/18 1126  GLUCAP 187* 139* 145* 138* 96   D-Dimer No results for input(s): DDIMER in the last 72 hours. Hgb A1c No results for input(s): HGBA1C in the last 72 hours. Lipid Profile No results for input(s): CHOL, HDL, LDLCALC, TRIG, CHOLHDL, LDLDIRECT in the last 72 hours. Thyroid function  studies No results for input(s): TSH, T4TOTAL, T3FREE, THYROIDAB in the last 72 hours.  Invalid input(s): FREET3 Anemia work up No results for input(s): VITAMINB12, FOLATE, FERRITIN, TIBC, IRON, RETICCTPCT in the last 72 hours. Urinalysis    Component Value Date/Time   COLORURINE YELLOW 08/31/2018 0805   APPEARANCEUR HAZY (A) 08/31/2018 0805   LABSPEC 1.013 08/31/2018 0805   PHURINE 6.0 08/31/2018 0805   GLUCOSEU NEGATIVE 08/31/2018 0805   HGBUR SMALL (A) 08/31/2018 0805   HGBUR negative 12/25/2008 1206   BILIRUBINUR NEGATIVE 08/31/2018 0805    BILIRUBINUR negative 06/19/2016 1000   KETONESUR NEGATIVE 08/31/2018 0805   PROTEINUR 30 (A) 08/31/2018 0805   UROBILINOGEN 0.2 06/19/2016 1000   UROBILINOGEN 1.0 11/19/2014 1650   NITRITE NEGATIVE 08/31/2018 0805   LEUKOCYTESUR NEGATIVE 08/31/2018 0805   Sepsis Labs Invalid input(s): PROCALCITONIN,  WBC,  LACTICIDVEN Microbiology Recent Results (from the past 240 hour(s))  Respiratory Panel by PCR     Status: None   Collection Time: 08/31/18  8:09 AM  Result Value Ref Range Status   Adenovirus NOT DETECTED NOT DETECTED Final   Coronavirus 229E NOT DETECTED NOT DETECTED Final    Comment: (NOTE) The Coronavirus on the Respiratory Panel, DOES NOT test for the novel  Coronavirus (2019 nCoV)    Coronavirus HKU1 NOT DETECTED NOT DETECTED Final   Coronavirus NL63 NOT DETECTED NOT DETECTED Final   Coronavirus OC43 NOT DETECTED NOT DETECTED Final   Metapneumovirus NOT DETECTED NOT DETECTED Final   Rhinovirus / Enterovirus NOT DETECTED NOT DETECTED Final   Influenza A NOT DETECTED NOT DETECTED Final   Influenza B NOT DETECTED NOT DETECTED Final   Parainfluenza Virus 1 NOT DETECTED NOT DETECTED Final   Parainfluenza Virus 2 NOT DETECTED NOT DETECTED Final   Parainfluenza Virus 3 NOT DETECTED NOT DETECTED Final   Parainfluenza Virus 4 NOT DETECTED NOT DETECTED Final   Respiratory Syncytial Virus NOT DETECTED NOT DETECTED Final   Bordetella pertussis NOT DETECTED NOT DETECTED Final   Chlamydophila pneumoniae NOT DETECTED NOT DETECTED Final   Mycoplasma pneumoniae NOT DETECTED NOT DETECTED Final    Comment: Performed at [facility], [place]. [address], [place], [place] [address]  Blood Culture (routine x 2)     Status: None (Preliminary result)   Collection Time: 08/31/18  8:14 AM  Result Value Ref Range Status   Specimen Description RIGHT ANTECUBITAL  Final   Special Requests   Final    BOTTLES DRAWN AEROBIC AND ANAEROBIC Blood Culture adequate volume   Culture   Final    NO GROWTH  3 DAYS Performed at [facility], [address], [place], [place] [address]    Report Status PENDING  Incomplete  Blood Culture (routine x 2)     Status: None (Preliminary result)   Collection Time: 08/31/18  8:24 AM  Result Value Ref Range Status   Specimen Description BLOOD LEFT HAND  Final   Special Requests   Final    BOTTLES DRAWN AEROBIC AND ANAEROBIC Blood Culture adequate volume   Culture   Final    NO GROWTH 3 DAYS Performed at [facility], [address] [place], [place] [address]    Report Status PENDING  Incomplete  MRSA PCR Screening     Status: None   Collection Time: 08/31/18  4:49 PM  Result Value Ref Range Status   MRSA by PCR NEGATIVE NEGATIVE Final    Comment:        The GeneXpert MRSA Assay (  FDA approved for NASAL specimens only), is one component of a comprehensive MRSA colonization surveillance program. It is not intended to diagnose MRSA infection nor to guide or monitor treatment for MRSA infections. Performed at [facility], [address] [place], [place] [address]      Time coordinating discharge: 37mns  SIGNED:   JKathie Dike MD  [facility] 09/03/2018, 8:17 PM   If 7PM-7AM, please contact night-coverage [email or web address]

## 2018-09-05 LAB — CULTURE, BLOOD (ROUTINE X 2)
CULTURE: NO GROWTH
Culture: NO GROWTH
SPECIAL REQUESTS: ADEQUATE
Special Requests: ADEQUATE

## 2018-09-06 ENCOUNTER — Other Ambulatory Visit: Payer: Self-pay | Admitting: Oncology

## 2018-09-06 DIAGNOSIS — C61 Malignant neoplasm of prostate: Secondary | ICD-10-CM

## 2018-09-07 MED FILL — ABIRATERONE ACETATE 250 MG: 250 | 30 days supply | Qty: 120 | Fill #0

## 2018-09-12 ENCOUNTER — Encounter: Payer: Self-pay | Admitting: *Deleted

## 2018-09-12 ENCOUNTER — Telehealth: Payer: Self-pay

## 2018-09-12 NOTE — Telephone Encounter (Signed)
Oral Oncology Patient Advocate Encounter  Prior Authorization not needed for Generic Zytiga at this time.  Jacob Joseph Patient [place] Phone [phone] Fax [phone] 09/12/2018   4:06 PM

## 2018-09-12 NOTE — Telephone Encounter (Signed)
Oral Oncology Patient Advocate Encounter  Received notification from [employer] that the existing prior authorization for Zytiga is due for renewal.  Renewal PA submitted on CoverMyMeds Key [id number] Status is pending  Oral Oncology Clinic will continue to follow.  [place] Patient [place] Phone [phone] Fax [phone] 09/12/2018    4:03 PM

## 2018-09-14 ENCOUNTER — Telehealth: Payer: Self-pay | Admitting: Pharmacist

## 2018-09-24 ENCOUNTER — Other Ambulatory Visit: Payer: Self-pay | Admitting: Oncology

## 2018-09-26 ENCOUNTER — Other Ambulatory Visit: Payer: Self-pay | Admitting: Oncology

## 2018-09-26 DIAGNOSIS — C61 Malignant neoplasm of prostate: Secondary | ICD-10-CM

## 2018-09-26 NOTE — Telephone Encounter (Signed)
Tried reaching out to patient to follow up on medication help. Unable to reach.

## 2018-09-27 ENCOUNTER — Encounter: Payer: Self-pay | Admitting: Podiatry

## 2018-09-27 ENCOUNTER — Ambulatory Visit (INDEPENDENT_AMBULATORY_CARE_PROVIDER_SITE_OTHER): Payer: 59 | Admitting: Podiatry

## 2018-09-27 DIAGNOSIS — E1151 Type 2 diabetes mellitus with diabetic peripheral angiopathy without gangrene: Secondary | ICD-10-CM | POA: Diagnosis not present

## 2018-09-27 DIAGNOSIS — B351 Tinea unguium: Secondary | ICD-10-CM | POA: Diagnosis not present

## 2018-09-27 DIAGNOSIS — M79674 Pain in right toe(s): Secondary | ICD-10-CM | POA: Diagnosis not present

## 2018-09-27 DIAGNOSIS — M79675 Pain in left toe(s): Secondary | ICD-10-CM | POA: Diagnosis not present

## 2018-09-27 DIAGNOSIS — I739 Peripheral vascular disease, unspecified: Secondary | ICD-10-CM

## 2018-09-27 NOTE — Progress Notes (Signed)
Subjective: Patient presents today with diabetes, PAD and neuropathy for preventative diabetic foot care. He voices no new problems on today's visit. He has painful, discolored, thick toenails which interfere with daily activities. Pain is aggravated when wearing enclosed shoe gear. Pain is getting progressively worse and relieved with periodic professional debridement.  He was hospitalized in February for pneumonia.  Neva Seat, MD is his PCP.   Current Outpatient Medications:  .  abiraterone acetate (ZYTIGA) 250 MG tablet, TAKE 4 TABLETS (1,000 MG TOTAL) BY MOUTH DAILY. TAKE ON AN EMPTY STOMACH 1 HOUR BEFORE OR 2 HOURS AFTER A MEAL, Disp: 120 tablet, Rfl: 0 .  albuterol (PROAIR HFA) 108 (90 Base) MCG/ACT inhaler, INHALE 2 PUFFS INTO THE LUNGS EVERY 6 HOURS AS NEEDED FOR WHEEZING OR SHORTNESS OF BREATH, TO NOT USE EVERY DAY, USE ONLY IF NEEDED, Disp: 8.5 g, Rfl: 3 .  albuterol (PROVENTIL) (2.5 MG/3ML) 0.083% nebulizer solution, Take 3 mLs (2.5 mg total) by nebulization every 6 (six) hours as needed for wheezing or shortness of breath., Disp: 75 mL, Rfl: 12 .  apixaban (ELIQUIS) 2.5 MG TABS tablet, Take 1 tablet (2.5 mg total) by mouth 2 (two) times daily., Disp: 60 tablet, Rfl: 11 .  aspirin 81 MG EC tablet, Take 1 tablet (81 mg total) by mouth daily., Disp: 30 tablet, Rfl: 5 .  azelastine (ASTELIN) 0.1 % nasal spray, USE 1 SPRAY IN EACH NOSTRIL TWICE DAILY AS NEEDED FOR RHINITIS, Disp: 90 mL, Rfl: 1 .  Blood Glucose Monitoring Suppl (ONETOUCH VERIO) w/Device KIT, 1 Device by Does not apply route 2 (two) times daily. ICD 10  E11.8, non-insulin dependent, test up to twice daily, Disp: 1 kit, Rfl: 0 .  Budesonide 90 MCG/ACT inhaler, Inhale 2 puffs into the lungs 2 (two) times daily., Disp: 1 Inhaler, Rfl: 3 .  calcium gluconate 500 MG tablet, Take 1 tablet (500 mg total) by mouth daily., Disp: 90 tablet, Rfl: 3 .  cholecalciferol (VITAMIN D) 1000 units tablet, Take 1 tablet (1,000 Units  total) by mouth daily., Disp: 90 tablet, Rfl: 3 .  citalopram (CELEXA) 20 MG tablet, Take 20 mg by mouth daily., Disp: , Rfl:  .  diclofenac sodium (VOLTAREN) 1 % GEL, APPLY 4 GRAMS EXTERNALLY TO THE AFFECTED AREA FOUR TIMES DAILY AS NEEDED FOR PAIN, Disp: 100 g, Rfl: 1 .  FLOVENT HFA 220 MCG/ACT inhaler, USE 1 PUFF PO BID, Disp: , Rfl:  .  gabapentin (NEURONTIN) 300 MG capsule, TAKE 2 CAPSULES(600 MG) BY MOUTH TWICE DAILY, Disp: 120 capsule, Rfl: 5 .  GENVOYA 150-150-200-10 MG TABS tablet, TAKE 1 TABLET BY MOUTH DAILY WITH BREAKFAST, Disp: 30 tablet, Rfl: 5 .  glucose blood (ONETOUCH VERIO) test strip, Use as instructed ICD 10  E11.8, non-insulin dependent, test up to twice daily, Disp: 100 each, Rfl: 11 .  guaiFENesin (MUCINEX) 600 MG 12 hr tablet, Take 2 tablets (1,200 mg total) by mouth 2 (two) times daily., Disp: 30 tablet, Rfl: 0 .  hydroxypropyl methylcellulose / hypromellose (ISOPTO TEARS / GONIOVISC) 2.5 % ophthalmic solution, Place 1 drop into both eyes as needed for dry eyes., Disp: , Rfl:  .  hydrOXYzine (VISTARIL) 50 MG capsule, Take 50 mg by mouth at bedtime. , Disp: , Rfl:  .  lisinopril (PRINIVIL,ZESTRIL) 2.5 MG tablet, Take 1 tablet (2.5 mg total) by mouth daily., Disp: 90 tablet, Rfl: 1 .  metFORMIN (GLUCOPHAGE) 500 MG tablet, TAKE 1 TABLET BY MOUTH EVERY DAY WITH BREAKFAST, Disp: 90 tablet, Rfl:  1 .  nicotine (NICODERM CQ - DOSED IN MG/24 HOURS) 21 mg/24hr patch, Place 1 patch (21 mg total) onto the skin daily., Disp: 28 patch, Rfl: 0 .  ONETOUCH DELICA LANCETS FINE MISC, 1 Stick by Does not apply route 2 (two) times daily. ICD 10  E11.8, non-insulin dependent, test up to twice daily, Disp: 100 each, Rfl: 11 .  pravastatin (PRAVACHOL) 40 MG tablet, TAKE 1 TABLET(40 MG) BY MOUTH DAILY, Disp: 90 tablet, Rfl: 3 .  predniSONE (DELTASONE) 5 MG tablet, TAKE 1 TABLET(5 MG) BY MOUTH DAILY WITH BREAKFAST, Disp: 90 tablet, Rfl: 0 .  PREZISTA 800 MG tablet, TAKE 1 TABLET BY MOUTH EVERY DAY  WITH BREAKFAST, Disp: 30 tablet, Rfl: 5 .  QUEtiapine (SEROQUEL) 300 MG tablet, Take 300 mg by mouth 2 (two) times daily. , Disp: , Rfl:  .  sucralfate (CARAFATE) 1 g tablet, Take 1 tablet (1 g total) by mouth 4 (four) times daily., Disp: 120 tablet, Rfl: 2 .  tamsulosin (FLOMAX) 0.4 MG CAPS capsule, TAKE 1 CAPSULE(0.4 MG) BY MOUTH DAILY AFTER BREAKFAST, Disp: 6 capsule, Rfl: 0 .  Tiotropium Bromide-Olodaterol (STIOLTO RESPIMAT) 2.5-2.5 MCG/ACT AERS, Inhale 2 puffs into the lungs daily., Disp: 1 Inhaler, Rfl: 3 .  valACYclovir (VALTREX) 500 MG tablet, TAKE 1 TABLET BY MOUTH DAILY, Disp: 90 tablet, Rfl: 2   Allergies  Allergen Reactions  . Statins Other (See Comments)    Elevated liver enzymes.     Objective:  Vascular Examination: Capillary refill time <3 seconds x 10 digits  Dorsalis pedis pulses absent b/l  Posterior tibial pulses absent left and 2/4 right  No digital hair x 10 digits  Skin temperature gradient WNL b/l  No signs of gangrene/ischemia noted b/l.  Dermatological Examination: Skin warm and dry b/l.  Toenails 1-5 b/l discolored, thick, dystrophic with subungual debris and pain with palpation to nailbeds due to thickness of nails.  Musculoskeletal: Muscle strength 5/5 to all LE muscle groups  HAV with bunion b/l.  Neurological: Sensation diminished with 10 gram monofilament.   Assessment: 1. Painful onychomycosis toenails 1-5 b/l 2. NIDDM with Peripheral arterial disease 3. Neuropathy 4. HIV+  Plan: 1. Toenails 1-5 b/l were debrided in length and girth without iatrogenic bleeding. 2. Patient to continue soft, supportive shoe gear. 3. Patient to report any pedal injuries to medical professional immediately. 4. Follow up 3 months.  5. Patient/POA to call should there be a concern in the interim.

## 2018-09-27 NOTE — Patient Instructions (Signed)

## 2018-09-29 ENCOUNTER — Encounter: Payer: Self-pay | Admitting: Internal Medicine

## 2018-09-30 ENCOUNTER — Other Ambulatory Visit: Payer: Self-pay

## 2018-09-30 ENCOUNTER — Inpatient Hospital Stay: Payer: 59

## 2018-09-30 ENCOUNTER — Inpatient Hospital Stay: Payer: 59 | Attending: Oncology | Admitting: Oncology

## 2018-09-30 ENCOUNTER — Telehealth: Payer: Self-pay | Admitting: Oncology

## 2018-09-30 VITALS — BP 132/84 | HR 100 | Temp 98.6°F | Resp 18 | Ht 72.0 in | Wt 221.6 lb

## 2018-09-30 DIAGNOSIS — Z7189 Other specified counseling: Secondary | ICD-10-CM

## 2018-09-30 DIAGNOSIS — C61 Malignant neoplasm of prostate: Secondary | ICD-10-CM | POA: Diagnosis present

## 2018-09-30 DIAGNOSIS — C7951 Secondary malignant neoplasm of bone: Secondary | ICD-10-CM | POA: Insufficient documentation

## 2018-09-30 DIAGNOSIS — Z21 Asymptomatic human immunodeficiency virus [HIV] infection status: Secondary | ICD-10-CM | POA: Diagnosis not present

## 2018-09-30 DIAGNOSIS — E291 Testicular hypofunction: Secondary | ICD-10-CM | POA: Insufficient documentation

## 2018-09-30 LAB — CMP (CANCER CENTER ONLY)
ALT: 9 U/L (ref 0–44)
ANION GAP: 9 (ref 5–15)
AST: 12 U/L — ABNORMAL LOW (ref 15–41)
Albumin: 3.2 g/dL — ABNORMAL LOW (ref 3.5–5.0)
Alkaline Phosphatase: 97 U/L (ref 38–126)
BUN: 14 mg/dL (ref 8–23)
CO2: 22 mmol/L (ref 22–32)
Calcium: 9.1 mg/dL (ref 8.9–10.3)
Chloride: 106 mmol/L (ref 98–111)
Creatinine: 1.23 mg/dL (ref 0.61–1.24)
GFR, Est AFR Am: >60 mL/min (ref >60)
GFR, Estimated: >60 mL/min (ref >60)
Glucose, Bld: 85 mg/dL (ref 70–99)
Potassium: 4.5 mmol/L (ref 3.5–5.1)
Sodium: 137 mmol/L (ref 135–145)
Total Bilirubin: <0.2 mg/dL — ABNORMAL LOW (ref 0.3–1.2)
Total Protein: 7.4 g/dL (ref 6.5–8.1)

## 2018-09-30 LAB — CBC WITH DIFFERENTIAL (CANCER CENTER ONLY)
Abs Immature Granulocytes: 0.12 10*3/uL — ABNORMAL HIGH (ref 0.00–0.07)
Basophils Absolute: 0 10*3/uL (ref 0.0–0.1)
Basophils Relative: 0 %
Eosinophils Absolute: 0.1 10*3/uL (ref 0.0–0.5)
Eosinophils Relative: 1 %
HCT: 29.5 % — ABNORMAL LOW (ref 39.0–52.0)
Hemoglobin: 9.4 g/dL — ABNORMAL LOW (ref 13.0–17.0)
IMMATURE GRANULOCYTES: 1 %
Lymphocytes Relative: 11 %
Lymphs Abs: 1.1 10*3/uL (ref 0.7–4.0)
MCH: 27 pg (ref 26.0–34.0)
MCHC: 31.9 g/dL (ref 30.0–36.0)
MCV: 84.8 fL (ref 80.0–100.0)
Monocytes Absolute: 0.8 10*3/uL (ref 0.1–1.0)
Monocytes Relative: 8 %
Neutro Abs: 7.7 10*3/uL (ref 1.7–7.7)
Neutrophils Relative %: 79 %
Platelet Count: 204 10*3/uL (ref 150–400)
RBC: 3.48 MIL/uL — ABNORMAL LOW (ref 4.22–5.81)
RDW: 17.2 % — ABNORMAL HIGH (ref 11.5–15.5)
WBC Count: 9.8 10*3/uL (ref 4.0–10.5)
nRBC: 0 % (ref 0.0–0.2)

## 2018-09-30 MED FILL — ABIRATERONE ACETATE 250 MG: 250 | 30 days supply | Qty: 120 | Fill #0

## 2018-09-30 NOTE — Telephone Encounter (Signed)
Gave avs and calendar. Patient requested next day from what is stated on los due to appt already scheduled on that day

## 2018-09-30 NOTE — Progress Notes (Signed)
Hematology and Oncology Follow Up Visit  Jacob Joseph [id number] [birth year] 63 y.o. 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  He was initially diagnosed in 2017 with a PSA of 23.1 with advanced disease.   Prior Therapy: He was treated with Lupron under the care of [name] every 4 months since this time of diagnosis. He is status post radiation therapy for a total of 30 Gy in 10 fractions to be completed in September 2019 to the thoracic spine.  Current therapy: Zytiga 1000 mg daily with prednisone at 5 mg daily started in August 2019.   Interim History: Jacob Joseph presents today for a repeat evaluation.  Since the last visit, he was hospitalized between February 12 and February 15 for respiratory failure from pneumonia and was treated with antibiotics and steroids.  He was discharged on prednisone taper after that.  Since his discharge, he reports a feeling reasonably well and back to his baseline.  He is ambulating with the help of a cane without any recent falls or syncope.  He denies any fevers or dyspnea on exertion.  He denies any complications related to Zytiga.  He denies any worsening fatigue or edema.  Patient denied any alteration mental status, neuropathy, confusion or dizziness.  Denies any headaches or lethargy.  Denies any night sweats, weight loss or changes in appetite.  Denied orthopnea, dyspnea on exertion or chest discomfort.  Denies shortness of breath, difficulty breathing hemoptysis or cough.  Denies any abdominal distention, nausea, early satiety or dyspepsia.  Denies any hematuria, frequency, dysuria or nocturia.  Denies any skin irritation, dryness or rash.  Denies any ecchymosis or petechiae.  Denies any lymphadenopathy or clotting.  Denies any heat or cold intolerance.  Denies any anxiety or depression.  Remaining review of  system is negative.       Medications: I have reviewed the patient's current medications.  Current Outpatient Medications  Medication Sig Dispense Refill  . abiraterone acetate (ZYTIGA) 250 MG tablet TAKE 4 TABLETS (1,000 MG TOTAL) BY MOUTH DAILY. TAKE ON AN EMPTY STOMACH 1 HOUR BEFORE OR 2 HOURS AFTER A MEAL 120 tablet 0  . albuterol (PROAIR HFA) 108 (90 Base) MCG/ACT inhaler INHALE 2 PUFFS INTO THE LUNGS EVERY 6 HOURS AS NEEDED FOR WHEEZING OR SHORTNESS OF BREATH, TO NOT USE EVERY DAY, USE ONLY IF NEEDED 8.5 g 3  . albuterol (PROVENTIL) (2.5 MG/3ML) 0.083% nebulizer solution Take 3 mLs (2.5 mg total) by nebulization every 6 (six) hours as needed for wheezing or shortness of breath. 75 mL 12  . apixaban (ELIQUIS) 2.5 MG TABS tablet Take 1 tablet (2.5 mg total) by mouth 2 (two) times daily. 60 tablet 11  . aspirin 81 MG EC tablet Take 1 tablet (81 mg total) by mouth daily. 30 tablet 5  . azelastine (ASTELIN) 0.1 % nasal spray USE 1 SPRAY IN EACH NOSTRIL TWICE DAILY AS NEEDED FOR RHINITIS 90 mL 1  . Blood Glucose Monitoring Suppl (ONETOUCH VERIO) w/Device KIT 1 Device by Does not apply route 2 (two) times daily. ICD 10  E11.8, non-insulin dependent, test up to twice daily 1 kit 0  . Budesonide 90 MCG/ACT inhaler Inhale 2 puffs into the lungs 2 (two) times daily. 1 Inhaler 3  . calcium gluconate 500 MG tablet Take 1 tablet (500 mg total) by mouth daily. 90 tablet 3  . cholecalciferol (VITAMIN D) 1000 units  tablet Take 1 tablet (1,000 Units total) by mouth daily. 90 tablet 3  . citalopram (CELEXA) 20 MG tablet Take 20 mg by mouth daily.    . diclofenac sodium (VOLTAREN) 1 % GEL APPLY 4 GRAMS EXTERNALLY TO THE AFFECTED AREA FOUR TIMES DAILY AS NEEDED FOR PAIN 100 g 1  . FLOVENT HFA 220 MCG/ACT inhaler USE 1 PUFF PO BID    . gabapentin (NEURONTIN) 300 MG capsule TAKE 2 CAPSULES(600 MG) BY MOUTH TWICE DAILY 120 capsule 5  . GENVOYA 150-150-200-10 MG TABS tablet TAKE 1 TABLET BY MOUTH DAILY WITH  BREAKFAST 30 tablet 5  . glucose blood (ONETOUCH VERIO) test strip Use as instructed ICD 10  E11.8, non-insulin dependent, test up to twice daily 100 each 11  . guaiFENesin (MUCINEX) 600 MG 12 hr tablet Take 2 tablets (1,200 mg total) by mouth 2 (two) times daily. 30 tablet 0  . hydroxypropyl methylcellulose / hypromellose (ISOPTO TEARS / GONIOVISC) 2.5 % ophthalmic solution Place 1 drop into both eyes as needed for dry eyes.    . hydrOXYzine (VISTARIL) 50 MG capsule Take 50 mg by mouth at bedtime.     [name] Kitchen lisinopril (PRINIVIL,ZESTRIL) 2.5 MG tablet Take 1 tablet (2.5 mg total) by mouth daily. 90 tablet 1  . metFORMIN (GLUCOPHAGE) 500 MG tablet TAKE 1 TABLET BY MOUTH EVERY DAY WITH BREAKFAST 90 tablet 1  . nicotine (NICODERM CQ - DOSED IN MG/24 HOURS) 21 mg/24hr patch Place 1 patch (21 mg total) onto the skin daily. 28 patch 0  . ONETOUCH DELICA LANCETS FINE MISC 1 Stick by Does not apply route 2 (two) times daily. ICD 10  E11.8, non-insulin dependent, test up to twice daily 100 each 11  . pravastatin (PRAVACHOL) 40 MG tablet TAKE 1 TABLET(40 MG) BY MOUTH DAILY 90 tablet 3  . predniSONE (DELTASONE) 5 MG tablet TAKE 1 TABLET(5 MG) BY MOUTH DAILY WITH BREAKFAST 90 tablet 0  . PREZISTA 800 MG tablet TAKE 1 TABLET BY MOUTH EVERY DAY WITH BREAKFAST 30 tablet 5  . QUEtiapine (SEROQUEL) 300 MG tablet Take 300 mg by mouth 2 (two) times daily.     . sucralfate (CARAFATE) 1 g tablet Take 1 tablet (1 g total) by mouth 4 (four) times daily. 120 tablet 2  . tamsulosin (FLOMAX) 0.4 MG CAPS capsule TAKE 1 CAPSULE(0.4 MG) BY MOUTH DAILY AFTER BREAKFAST 6 capsule 0  . Tiotropium Bromide-Olodaterol (STIOLTO RESPIMAT) 2.5-2.5 MCG/ACT AERS Inhale 2 puffs into the lungs daily. 1 Inhaler 3  . valACYclovir (VALTREX) 500 MG tablet TAKE 1 TABLET BY MOUTH DAILY 90 tablet 2   No current facility-administered medications for this visit.      Allergies:  Allergies  Allergen Reactions  . Statins Other (See Comments)     Elevated liver enzymes.    Past Medical History, Surgical history, Social history, and Family History were reviewed and updated.   Physical Exam:  Blood pressure 132/84, pulse 100, temperature 98.6 F (37 C), temperature source Oral, resp. rate 18, height 6' (1.829 m), weight 221 lb 9.6 oz (100.5 kg), SpO2 98 %.   ECOG: 0   General appearance: Alert, awake without any distress. Head: Atraumatic without abnormalities Oropharynx: Without any thrush or ulcers. Eyes: No scleral icterus. Lymph nodes: No lymphadenopathy noted in the cervical, supraclavicular, or axillary nodes Heart:regular rate and rhythm, without any murmurs or gallops.   Lung: Clear to auscultation without any rhonchi, wheezes or dullness to percussion. Abdomin: Soft, nontender without any shifting dullness or ascites.  Musculoskeletal: No clubbing or cyanosis. Neurological: No motor or sensory deficits. Skin: No rashes or lesions.     Lab Results: Lab Results  Component Value Date   WBC 9.8 09/30/2018   HGB 9.4 (L) 09/30/2018   HCT 29.5 (L) 09/30/2018   MCV 84.8 09/30/2018   PLT 204 09/30/2018     Chemistry      Component Value Date/Time   NA 137 09/03/2018 0657   NA 140 01/27/2016 1527   K 3.7 09/03/2018 0657   CL 110 09/03/2018 0657   CO2 18 (L) 09/03/2018 0657   BUN 31 (H) 09/03/2018 0657   BUN 14 01/27/2016 1527   CREATININE 1.14 09/03/2018 0657   CREATININE 1.06 08/24/2018 0919      Component Value Date/Time   CALCIUM 8.6 (L) 09/03/2018 0657   ALKPHOS 52 09/03/2018 0657   AST 16 09/03/2018 0657   AST 12 (L) 07/01/2018 1445   ALT 10 09/03/2018 0657   ALT 6 07/01/2018 1445   BILITOT 0.3 09/03/2018 0657   BILITOT 0.2 (L) 07/01/2018 1445      Results for Jacob Joseph, Jacob Joseph (MRN [id number]) as of 09/30/2018 13:41  Ref. Range 04/12/2018 15:05 07/01/2018 14:45  Prostate Specific Ag, Serum Latest Ref Range: 0.0 - 4.0 ng/mL 4.7 (H) 4.7 (H)       Impression and Plan:  63 year old man with  the:  1.    Advanced prostate cancer that is currently castration-resistant diagnosed in 2019.    He continues to tolerate Zytiga with reasonable response in his PSA that has stabilized based on readings in December 2019.  Risks and benefits of continuing this therapy was discussed today.  Alternative treatments were also reiterated.  These options would include Xtandi and systemic chemotherapy.  For the time being we will continue to monitor his PSA and maintain him on Zytiga.   2.  Androgen deprivation: I recommended continuing Lupron indefinitely.  Continues to receive that under the care of [name].  3.  Thoracic spine metastasis: He status post radiation to the spine.  No issues reported since the last visit.  4.  Bone directed therapy: I recommended continued calcium and vitamin D supplements.  Will consider Xgeva after obtaining dental clearance.  5.  Prognosis and goals of care: Therapy remains palliative at this time although his performance status is adequate and aggressive therapy is warranted.  6.  HIV: He continues to follow with Dr. Megan Salon regarding this issue.    7.  Follow-up: 3 months with a repeat evaluation.    minutes was spent with the patient face-to-face today.  More than 50% of time was dedicated to reviewing disease status, treatment options, dealing with complications related to therapy and answering questions regarding future plan of care.      Zola Button, MD 3/13/20202:00 PM

## 2018-10-01 LAB — PROSTATE-SPECIFIC AG, SERUM (LABCORP): Prostate Specific Ag, Serum: 5.5 ng/mL — ABNORMAL HIGH (ref 0.0–4.0)

## 2018-10-19 ENCOUNTER — Other Ambulatory Visit: Payer: Self-pay | Admitting: Internal Medicine

## 2018-10-19 ENCOUNTER — Other Ambulatory Visit: Payer: Self-pay | Admitting: Oncology

## 2018-10-19 DIAGNOSIS — M1990 Unspecified osteoarthritis, unspecified site: Secondary | ICD-10-CM

## 2018-10-20 ENCOUNTER — Encounter: Payer: Self-pay | Admitting: Internal Medicine

## 2018-10-20 NOTE — Telephone Encounter (Signed)
Next appt scheduled 4/23 with PCP.

## 2018-10-21 ENCOUNTER — Telehealth: Payer: Self-pay | Admitting: *Deleted

## 2018-10-21 NOTE — Telephone Encounter (Signed)
Refills approved.

## 2018-10-21 NOTE — Telephone Encounter (Signed)
[facility] Support Services   [facility] Support Services Team contacted patient to assess for food insecurity and other psychosocial needs during current COVID19 pandemic.   Patient/family expressed concerns regarding access to food or managing costs of food during this time.  Support Team member discussed resources and will follow up as resources are identified.  Support Team member encouraged patient to call if changes occur or they have any other questions/concerns.   Melia Hopes, LCSW  [facility]        

## 2018-10-24 ENCOUNTER — Telehealth: Payer: Self-pay

## 2018-10-24 NOTE — Telephone Encounter (Signed)
Oral Oncology Patient Advocate Encounter  [facility] has attempted to reach the patient 3 times to refill his Fabio Asa will no success.   I called the patient today and had to leave a voicemail.  [place] Patient [place] Phone [phone] Fax [phone] 10/24/2018   2:38 PM

## 2018-10-26 ENCOUNTER — Other Ambulatory Visit: Payer: Self-pay | Admitting: Oncology

## 2018-10-26 DIAGNOSIS — C61 Malignant neoplasm of prostate: Secondary | ICD-10-CM

## 2018-10-27 MED FILL — ABIRATERONE ACETATE 250 MG: 250 | 30 days supply | Qty: 120 | Fill #0

## 2018-10-27 NOTE — Telephone Encounter (Signed)
Oral Oncology Patient Advocate Encounter  The patient arranged with [place] to have his Zytiga shipped to him today.  [place] Patient [place] Phone [phone] Fax [phone] 10/27/2018   8:44 AM

## 2018-11-09 ENCOUNTER — Telehealth: Payer: Self-pay | Admitting: Pharmacist

## 2018-11-09 NOTE — Telephone Encounter (Signed)
Alerted by patient's insurance that he was prescribed Flovent (fluticasone) inhaler.  Patient is also taking Genvoya and Prezista for his HIV infection.  Genvoya is contraindicated with fluticasone as it can cause significant increases in serum concentrations of steroid and lead to the potential of Cushing's syndrome and adrenal suppression.   Called patient to discuss and he did not answer.  Will keep trying to get in touch with patient.

## 2018-11-10 ENCOUNTER — Ambulatory Visit (INDEPENDENT_AMBULATORY_CARE_PROVIDER_SITE_OTHER): Payer: 59 | Admitting: Internal Medicine

## 2018-11-10 ENCOUNTER — Other Ambulatory Visit: Payer: Self-pay

## 2018-11-10 ENCOUNTER — Encounter: Payer: Self-pay | Admitting: Internal Medicine

## 2018-11-10 DIAGNOSIS — Z7951 Long term (current) use of inhaled steroids: Secondary | ICD-10-CM | POA: Diagnosis not present

## 2018-11-10 DIAGNOSIS — Z79899 Other long term (current) drug therapy: Secondary | ICD-10-CM

## 2018-11-10 DIAGNOSIS — J449 Chronic obstructive pulmonary disease, unspecified: Secondary | ICD-10-CM | POA: Diagnosis not present

## 2018-11-10 DIAGNOSIS — R296 Repeated falls: Secondary | ICD-10-CM

## 2018-11-10 NOTE — Assessment & Plan Note (Signed)
Patient has had ongoing issues with recurrent falls and has been referred to PT previously, but hasdifficulty with transportation. He already has access to [facility]. I have made his referral again, this time specifically for the [place] outpatient rehab location; which is near to his home. Hopefully he will be able to make it to this location when restrictions related to the [place] are lifted. - Referral to PT near his home

## 2018-11-10 NOTE — Progress Notes (Signed)
   CC: COPD, recurrent Falls   HPI:  [name] is a 63 y.o. M with PMHx listed below presenting for COPD, recurrent Falls. Please see the A&P for the status of the patient's chronic medical problems.  Past Medical History:  Diagnosis Date  . AKI (acute kidney injury) ([place]) 09/24/2016  . Calcium oxalate crystals in urine 12/23/2011   Asymptomatic, no hematuria. Advised to take plenty of water.  [name] Kitchen COPD (chronic obstructive pulmonary disease) ([place])   . Depression   . Diabetes mellitus without complication ([place]) 6712   pre diabetic  . Hepatitis C   . HIV (human immunodeficiency virus infection) ([place])   . Hypertension   . PE (pulmonary thromboembolism) ([place]) 05/12/2016   PE in 2017, on chronic anticoagulation for this and recurrent DVT.  [name] Kitchen Peripheral arterial disease ([place])    ooccluded left SFA by duplex ultrasound, tibial disease left greater than right  . Prostate cancer metastatic to multiple sites [facility])   . Stroke ([place]) 11/2011   Carotids Doppler negative. Right sided weakness resolved, initially on Plavix for 3 months and then continued with ASA.    . TB lung, latent 1988   Treated   Review of Systems: Performed and all others negative.  Physical Exam:   There were no vitals filed for this visit. Not performed for tele-health visit  Assessment & Plan:   See Encounters Tab for problem based charting.  Patient discussed with Dr. Dareen Piano

## 2018-11-10 NOTE — Patient Instructions (Signed)
Verbal instructions provided for tele-health visit

## 2018-11-10 NOTE — Telephone Encounter (Signed)
Spoke with patient today and went over his inhalers with him.  He also had a visit with his PCP today who saw my note and also told him to stop the Flovent inhaler. Patient states that he was discharged from the hospital with that prescription but never received it.  He will take Stiolto and Pulmicort for his COPD. My appreciation to Dr. Trilby Drummer as well for going over this with him today.  Patient agrees with plan.

## 2018-11-10 NOTE — Assessment & Plan Note (Signed)
Patient has a history of well controlled COPD. Currently taking Stiolto and Budesonide with PRN Albuterol. He was admitted to the hospital in Feb with PNA and subsequent COPD exacerbation. He denies SOB. He was started on a new inhale, Flovent, at his recent hospitalization. A pharmacist telephone note from his ID office stated this interacted with is HIV medication causing a rise in steroid levels. Given he was well controlled prior to his hospitalization he was instructed to stop this medication and contact his ID office to speak with their pharmacist in case they have any further instructions. Patient denies and shortness of breath. Will resume prior regimen - Tiotropium-Olodaterol (STIOLTO) 2.5-2.5, 2 puff Daily - Budesonide, 1-2 puffs BID - Albuterol PRN

## 2018-11-11 ENCOUNTER — Telehealth (HOSPITAL_COMMUNITY): Payer: Self-pay | Admitting: Physical Therapy

## 2018-11-11 NOTE — Telephone Encounter (Signed)
Therapist called patient as we received a referral for physical therapy for him. Patient did not answer, so left a message stating that the clinic is closed at this time, but that patient should call back so that therapist could discuss with the patient some different options. Provided clinic phone number.  Clarene Critchley PT, DPT 1:16 PM, 11/11/18 [phone]

## 2018-11-11 NOTE — Progress Notes (Signed)
Internal Medicine Clinic Attending  Case discussed with Dr. Melvin soon after the resident saw the patient.  We reviewed the resident's history and exam and pertinent patient test results.  I agree with the assessment, diagnosis, and plan of care documented in the resident's note.   

## 2018-11-12 ENCOUNTER — Other Ambulatory Visit: Payer: Self-pay | Admitting: Internal Medicine

## 2018-11-12 DIAGNOSIS — Z8679 Personal history of other diseases of the circulatory system: Secondary | ICD-10-CM

## 2018-11-18 ENCOUNTER — Telehealth: Payer: Self-pay | Admitting: Internal Medicine

## 2018-11-18 ENCOUNTER — Telehealth (HOSPITAL_COMMUNITY): Payer: Self-pay

## 2018-11-18 ENCOUNTER — Other Ambulatory Visit: Payer: Self-pay | Admitting: Internal Medicine

## 2018-11-18 DIAGNOSIS — R296 Repeated falls: Secondary | ICD-10-CM

## 2018-11-18 NOTE — Telephone Encounter (Signed)
The front office transferred Mr. Monte[place]e call to me to discuss his physical therapy referral. I informed him that as he is considered high risk for COVID-19 transmission and complications it would not be appropriate for him to come into the outpatient setting. I educated him on the option to participate in home health physical therapy for his back pain and recurrent falls. He was agreeable to this option and will call his PCP to request the referral for this. I also called his PCP's office as well to inform them why we feel he should participate in home health instead of outpatient therapy.   Kipp Brood, PT, DPT, [facility] Physical Therapist with [facility]  11/18/2018 2:19 PM

## 2018-11-18 NOTE — Telephone Encounter (Signed)
Sending to attending, pcp, lauren, sharon and mamie Dr Trilby Drummer and Dareen Piano, could you place an order for [facility] PT for eval and treat?

## 2018-11-18 NOTE — Telephone Encounter (Signed)
Pt calling to request  Home Health for in home Physical Therapy and would like some help with getting it started. Please call patient back.

## 2018-11-18 NOTE — Telephone Encounter (Signed)
[facility] PT requesting patient to transition to in home PT due to being high risk for COVID-19 in their facility. Please contact Apolonio Schneiders @ [facility] [phone] if any questions.

## 2018-11-18 NOTE — Telephone Encounter (Signed)
I put an order in

## 2018-11-22 ENCOUNTER — Telehealth: Payer: Self-pay

## 2018-11-22 NOTE — Telephone Encounter (Signed)
Jacob Joseph with [name] needs to speak with a nurse about referral for home health PT. Please call back @ [phone].

## 2018-11-22 NOTE — Telephone Encounter (Signed)
Fell[place], Renaee Munda, RN  Phone Number:  [phone] (Call me)        Wanted to see about the possibility of getting RN added to orders at least for 1 visit. It looks like the patient has [facility] and due to an initiative that [facility] has with [facility], they request that we have an RN evaluate in an effort to prevent Hospitalizations. You can call me at [phone]. Thanks!

## 2018-11-22 NOTE — Telephone Encounter (Signed)
Dub Amis notified that it is ok to add [facility] RN per Dr. Dareen Piano. Hubbard Hartshorn, RN, BSN

## 2018-11-22 NOTE — Telephone Encounter (Addendum)
Community message sent to Dub Amis, RN with [facility] that referral for [facility] PT has been placed. Hubbard Hartshorn, RN, BSN

## 2018-11-22 NOTE — Telephone Encounter (Signed)
OK. Thank you

## 2018-11-22 NOTE — Telephone Encounter (Signed)
This was addressed in separate phone encounter started 11/18/2018. Hubbard Hartshorn, RN, BSN

## 2018-11-22 NOTE — Telephone Encounter (Signed)
Following community message received from Butch Penny at [facility]:  Referral accepted for RN and PT and is being processed. Thanks! Call me if I can help with anything else!

## 2018-11-23 ENCOUNTER — Telehealth: Payer: Self-pay

## 2018-11-23 NOTE — Addendum Note (Signed)
Addended by: Hulan Fray on: 11/23/2018 06:55 PM   Modules accepted: Orders

## 2018-11-23 NOTE — Telephone Encounter (Signed)
Return call to Amy PT with [facility] - requesting verbal orders for "Twice a week x 3 weeks then once a week x 2 weeks" to work on pt's balance; pt stated he has been having a lot of falls. VO given - if not appropriate let me know. Thanks

## 2018-11-23 NOTE — Telephone Encounter (Signed)
Am with [facility] requesting VO for PT. Please call back.

## 2018-11-23 NOTE — Telephone Encounter (Signed)
I agree with this VO. Thank you

## 2018-11-24 ENCOUNTER — Encounter: Payer: Self-pay | Admitting: General Practice

## 2018-11-24 ENCOUNTER — Other Ambulatory Visit: Payer: Self-pay | Admitting: Oncology

## 2018-11-24 DIAGNOSIS — C61 Malignant neoplasm of prostate: Secondary | ICD-10-CM

## 2018-11-24 NOTE — Progress Notes (Signed)
[facility] CSW Progress Notes  Patient reports food insecurity, would like to enroll in [facility] food box program, agreeable to program contacting him to set up delivery.  Ifrah Vest, LCSW Clinical Social Worker Phone:  [phone] 

## 2018-11-28 MED FILL — ABIRATERONE ACETATE 250 MG: 250 | 30 days supply | Qty: 120 | Fill #0

## 2018-12-08 ENCOUNTER — Other Ambulatory Visit: Payer: Self-pay | Admitting: Internal Medicine

## 2018-12-08 DIAGNOSIS — B20 Human immunodeficiency virus [HIV] disease: Secondary | ICD-10-CM

## 2018-12-08 MED ORDER — ELVITEG-COBIC-EMTRICIT-TENOFAF 150-150-200-10 MG PO TABS: 1.0000 | ORAL_TABLET | Freq: Every day | ORAL | 5 refills | Status: DC

## 2018-12-16 ENCOUNTER — Other Ambulatory Visit: Payer: Self-pay | Admitting: Internal Medicine

## 2018-12-16 NOTE — Telephone Encounter (Signed)
[facility] wanted to discuss HIV meds and contraindications, advised to call [facility], gave her the ph#

## 2018-12-16 NOTE — Telephone Encounter (Signed)
RN calling about patient's meds and would like a call back.

## 2018-12-19 ENCOUNTER — Telehealth: Payer: Self-pay | Admitting: *Deleted

## 2018-12-19 NOTE — Telephone Encounter (Signed)
[name] from [facility] called to advise that when she put in the patients medications she gets a level 1 interaction between the patients Genvoya/Prezista and his Flomax. She wants to know if he should stop taking the flomax or what he should do. Advised will ask his provider and give her a call back.

## 2018-12-20 ENCOUNTER — Other Ambulatory Visit: Payer: Self-pay | Admitting: Oncology

## 2018-12-20 DIAGNOSIS — C61 Malignant neoplasm of prostate: Secondary | ICD-10-CM

## 2018-12-21 ENCOUNTER — Other Ambulatory Visit: Payer: Self-pay | Admitting: Internal Medicine

## 2018-12-21 DIAGNOSIS — B20 Human immunodeficiency virus [HIV] disease: Secondary | ICD-10-CM

## 2018-12-21 MED ORDER — BICTEGRAVIR-EMTRICITAB-TENOFOV 50-200-25 MG PO TABS: 1.0000 | ORAL_TABLET | Freq: Every day | ORAL | 11 refills | Status: AC

## 2018-12-21 NOTE — Telephone Encounter (Signed)
Let [name] know of the change and I will call the patient to inform as well and let know to pick up the Rx.

## 2018-12-21 NOTE — Telephone Encounter (Signed)
The cobicistat component of Genvoya interacts with Flomax and could lead to symptomatic hypotension.  Jacob Joseph is had recent falls.  I discussed the situation with our ID pharmacist, Cassie Kuppeleiser and we have decided to change his regimen to [facility].

## 2018-12-21 NOTE — Telephone Encounter (Signed)
Called the patient and informed to stop the Genvoya/Prezista and start the [place] once it is delivered. Advised to call the office with any problems.

## 2018-12-27 MED FILL — ABIRATERONE ACETATE 250 MG: 250 | 30 days supply | Qty: 120 | Fill #0

## 2018-12-30 ENCOUNTER — Other Ambulatory Visit: Payer: Self-pay | Admitting: Urology

## 2018-12-30 ENCOUNTER — Other Ambulatory Visit (HOSPITAL_COMMUNITY): Payer: Self-pay | Admitting: Urology

## 2018-12-30 DIAGNOSIS — C61 Malignant neoplasm of prostate: Secondary | ICD-10-CM

## 2019-01-03 ENCOUNTER — Ambulatory Visit (INDEPENDENT_AMBULATORY_CARE_PROVIDER_SITE_OTHER): Payer: 59 | Admitting: Podiatry

## 2019-01-03 ENCOUNTER — Encounter: Payer: Self-pay | Admitting: Podiatry

## 2019-01-03 ENCOUNTER — Other Ambulatory Visit: Payer: Self-pay

## 2019-01-03 VITALS — Temp 98.0°F

## 2019-01-03 DIAGNOSIS — G629 Polyneuropathy, unspecified: Secondary | ICD-10-CM

## 2019-01-03 DIAGNOSIS — E1151 Type 2 diabetes mellitus with diabetic peripheral angiopathy without gangrene: Secondary | ICD-10-CM

## 2019-01-03 DIAGNOSIS — B351 Tinea unguium: Secondary | ICD-10-CM | POA: Diagnosis not present

## 2019-01-03 DIAGNOSIS — B353 Tinea pedis: Secondary | ICD-10-CM

## 2019-01-03 DIAGNOSIS — E119 Type 2 diabetes mellitus without complications: Secondary | ICD-10-CM | POA: Diagnosis not present

## 2019-01-03 DIAGNOSIS — M79674 Pain in right toe(s): Secondary | ICD-10-CM

## 2019-01-03 DIAGNOSIS — M79675 Pain in left toe(s): Secondary | ICD-10-CM

## 2019-01-03 MED ORDER — CICLOPIROX OLAMINE 0.77 % EX CREA: TOPICAL_CREAM | Freq: Two times a day (BID) | CUTANEOUS | 0 refills | Status: DC

## 2019-01-03 NOTE — Patient Instructions (Addendum)
Athlete's Foot  Athlete's foot (tinea pedis) is a fungal infection of the skin on your feet. It often occurs on the skin that is between or underneath the toes. It can also occur on the soles of your feet. The infection can spread from person to person (is contagious). It can also spread when a person's bare feet come in contact with the fungus on shower floors or on items such as shoes. What are the causes? This condition is caused by a fungus that grows in warm, moist places. You can get athlete's foot by sharing shoes, shower stalls, towels, and wet floors with someone who is infected. Not washing your feet or changing your socks often enough can also lead to athlete's foot. What increases the risk? This condition is more likely to develop in:  Men.  People who have a weak body defense system (immune system).  People who have diabetes.  People who use public showers, such as at a gym.  People who wear heavy-duty shoes, such as industrial or military shoes.  Seasons with warm, humid weather. What are the signs or symptoms? Symptoms of this condition include:  Itchy areas between your toes or on the soles of your feet.  White, flaky, or scaly areas between your toes or on the soles of your feet.  Very itchy small blisters between your toes or on the soles of your feet.  Small cuts in your skin. These cuts can become infected.  Thick or discolored toenails. How is this diagnosed? This condition may be diagnosed with a physical exam and a review of your medical history. Your health care provider may also take a skin or toenail sample to examine under a microscope. How is this treated? This condition is treated with antifungal medicines. These may be applied as powders, ointments, or creams. In severe cases, an oral antifungal medicine may be given. Follow these instructions at home: Medicines  Apply or take over-the-counter and prescription medicines only as told by your health  care provider.  Apply your antifungal medicine as told by your health care provider. Do not stop using the antifungal even if your condition improves. Foot care  Do not scratch your feet.  Keep your feet dry: ? Wear cotton or wool socks. Change your socks every day or if they become wet. ? Wear shoes that allow air to flow, such as sandals or canvas tennis shoes.  Wash and dry your feet, including the area between your toes. Also, wash and dry your feet: ? Every day or as told by your health care provider. ? After exercising. General instructions  Do not let others use towels, shoes, nail clippers, or other personal items that touch your feet.  Protect your feet by wearing sandals in wet areas, such as locker rooms and shared showers.  Keep all follow-up visits as told by your health care provider. This is important.  If you have diabetes, keep your blood sugar under control. Contact a health care provider if:  You have a fever.  You have swelling, soreness, warmth, or redness in your foot.  Your feet are not getting better with treatment.  Your symptoms get worse.  You have new symptoms. Summary  Athlete's foot (tinea pedis) is a fungal infection of the skin on your feet. It often occurs on skin that is between or underneath the toes.  This condition is caused by a fungus that grows in warm, moist places.  Symptoms include white, flaky, or scaly areas between   your toes or on the soles of your feet.  This condition is treated with antifungal medicines.  Keep your feet clean. Always dry them thoroughly. This information is not intended to replace advice given to you by your health care provider. Make sure you discuss any questions you have with your health care provider. Document Released: 07/03/2000 Document Revised: 04/26/2017 Document Reviewed: 04/26/2017 Elsevier Interactive Patient Education  2019 [employer]    Diabetes Mellitus and Foot Care Foot care is  an important part of your health, especially when you have diabetes. Diabetes may cause you to have problems because of poor blood flow (circulation) to your feet and legs, which can cause your skin to:  Become thinner and drier.  Break more easily.  Heal more slowly.  Peel and crack. You may also have nerve damage (neuropathy) in your legs and feet, causing decreased feeling in them. This means that you may not notice minor injuries to your feet that could lead to more serious problems. Noticing and addressing any potential problems early is the best way to prevent future foot problems. How to care for your feet Foot hygiene  Wash your feet daily with warm water and mild soap. Do not use hot water. Then, pat your feet and the areas between your toes until they are completely dry. Do not soak your feet as this can dry your skin.  Trim your toenails straight across. Do not dig under them or around the cuticle. File the edges of your nails with an emery board or nail file.  Apply a moisturizing lotion or petroleum jelly to the skin on your feet and to dry, brittle toenails. Use lotion that does not contain alcohol and is unscented. Do not apply lotion between your toes. Shoes and socks  Wear clean socks or stockings every day. Make sure they are not too tight. Do not wear knee-high stockings since they may decrease blood flow to your legs.  Wear shoes that fit properly and have enough cushioning. Always look in your shoes before you put them on to be sure there are no objects inside.  To break in new shoes, wear them for just a few hours a day. This prevents injuries on your feet. Wounds, scrapes, corns, and calluses  Check your feet daily for blisters, cuts, bruises, sores, and redness. If you cannot see the bottom of your feet, use a mirror or ask someone for help.  Do not cut corns or calluses or try to remove them with medicine.  If you find a minor scrape, cut, or break in the skin  on your feet, keep it and the skin around it clean and dry. You may clean these areas with mild soap and water. Do not clean the area with peroxide, alcohol, or iodine.  If you have a wound, scrape, corn, or callus on your foot, look at it several times a day to make sure it is healing and not infected. Check for: ? Redness, swelling, or pain. ? Fluid or blood. ? Warmth. ? Pus or a bad smell. General instructions  Do not cross your legs. This may decrease blood flow to your feet.  Do not use heating pads or hot water bottles on your feet. They may burn your skin. If you have lost feeling in your feet or legs, you may not know this is happening until it is too late.  Protect your feet from hot and cold by wearing shoes, such as at the beach or on   hot pavement.  Schedule a complete foot exam at least once a year (annually) or more often if you have foot problems. If you have foot problems, report any cuts, sores, or bruises to your health care provider immediately. Contact a health care provider if:  You have a medical condition that increases your risk of infection and you have any cuts, sores, or bruises on your feet.  You have an injury that is not healing.  You have redness on your legs or feet.  You feel burning or tingling in your legs or feet.  You have pain or cramps in your legs and feet.  Your legs or feet are numb.  Your feet always feel cold.  You have pain around a toenail. Get help right away if:  You have a wound, scrape, corn, or callus on your foot and: ? You have pain, swelling, or redness that gets worse. ? You have fluid or blood coming from the wound, scrape, corn, or callus. ? Your wound, scrape, corn, or callus feels warm to the touch. ? You have pus or a bad smell coming from the wound, scrape, corn, or callus. ? You have a fever. ? You have a red line going up your leg. Summary  Check your feet every day for cuts, sores, red spots, swelling, and  blisters.  Moisturize feet and legs daily.  Wear shoes that fit properly and have enough cushioning.  If you have foot problems, report any cuts, sores, or bruises to your health care provider immediately.  Schedule a complete foot exam at least once a year (annually) or more often if you have foot problems. This information is not intended to replace advice given to you by your health care provider. Make sure you discuss any questions you have with your health care provider. Document Released: 07/03/2000 Document Revised: 08/18/2017 Document Reviewed: 08/07/2016 Elsevier Interactive Patient Education  2019 [employer]  Onychomycosis/Fungal Toenails  WHAT IS IT? An infection that lies within the keratin of your nail plate that is caused by a fungus.  WHY ME? Fungal infections affect all ages, sexes, races, and creeds.  There may be many factors that predispose you to a fungal infection such as age, coexisting medical conditions such as diabetes, or an autoimmune disease; stress, medications, fatigue, genetics, etc.  Bottom line: fungus thrives in a warm, moist environment and your shoes offer such a location.  IS IT CONTAGIOUS? Theoretically, yes.  You do not want to share shoes, nail clippers or files with someone who has fungal toenails.  Walking around barefoot in the same room or sleeping in the same bed is unlikely to transfer the organism.  It is important to realize, however, that fungus can spread easily from one nail to the next on the same foot.  HOW DO WE TREAT THIS?  There are several ways to treat this condition.  Treatment may depend on many factors such as age, medications, pregnancy, liver and kidney conditions, etc.  It is best to ask your doctor which options are available to you.  1. No treatment.   Unlike many other medical concerns, you can live with this condition.  However for many people this can be a painful condition and may lead to ingrown toenails or a bacterial  infection.  It is recommended that you keep the nails cut short to help reduce the amount of fungal nail. 2. Topical treatment.  These range from herbal remedies to prescription strength nail lacquers.  About 40-50%   effective, topicals require twice daily application for approximately 9 to 12 months or until an entirely new nail has grown out.  The most effective topicals are medical grade medications available through physicians offices. 3. Oral antifungal medications.  With an 80-90% cure rate, the most common oral medication requires 3 to 4 months of therapy and stays in your system for a year as the new nail grows out.  Oral antifungal medications do require blood work to make sure it is a safe drug for you.  A liver function panel will be performed prior to starting the medication and after the first month of treatment.  It is important to have the blood work performed to avoid any harmful side effects.  In general, this medication safe but blood work is required. 4. Laser Therapy.  This treatment is performed by applying a specialized laser to the affected nail plate.  This therapy is noninvasive, fast, and non-painful.  It is not covered by insurance and is therefore, out of pocket.  The results have been very good with a 80-95% cure rate.  [facility] is the only practice in the area to offer this therapy. 5. Permanent Nail Avulsion.  Removing the entire nail so that a new nail will not grow back. 

## 2019-01-03 NOTE — Progress Notes (Signed)
Subjective: Jacob Joseph presents today for preventative diabetic foot care with painful, thick toenails 1-5 b/l that he cannot cut and which interfere with daily activities.  Pain is aggravated when wearing enclosed shoe gear.  Neva Seat, MD is his PCP and last visit was 11/10/2018.  Medications reviewed.   Allergies  Allergen Reactions  . Statins Other (See Comments)    Elevated liver enzymes.    Objective: Vitals:   01/03/19 1315  Temp: 98 F (36.7 C)    Vascular Examination: Capillary refill time <4 seconds x 10 digits.  Dorsalis pedis nonpalpable b/l.   Posterior tibial pulses left foot and 2/4 b/l.  Digital hair present x 10 digits.  Skin temperature gradient WNL b/l.  Dermatological Examination: Skin with normal turgor, texture and tone b/l.  Toenails 1-5 b/l discolored, thick, dystrophic with subungual debris and pain with palpation to nailbeds due to thickness of nails.  Diffuse scaling noted peripherally and plantarly b/l feet with mild foot odor.  No interdigital macerations.  No blisters, no weeping. No signs of secondary bacterial infection noted.  Musculoskeletal: Muscle strength 5/5 to all LE muscle groups.  HAV with bunion b/l.  No pain, crepitus or joint limitation noted with ROM.   Neurological: Sensation diminished 10 gram monofilament.  Vibratory sensation intact.  Assessment: Painful onychomycosis toenails 1-5 b/l  Tinea pedis b/l NIDDM with PAD HIV  Plan: 1. Continue diabetic foot care principles. Literature dispensed on today. 2. Toenails 1-5 b/l were debrided in length and girth without iatrogenic bleeding. Counseled patient on tinea pedis. Prescription written for ciclopirox cream 0.77%.  Patient is to apply to both feet and between toes twice daily for 4 weeks. 3. Patient to continue soft, supportive shoe gear daily. 4. Patient to report any pedal injuries to medical professional immediately. 5. Follow up 3 months.   6. Patient/POA to call should there be a concern in the interim.

## 2019-01-04 ENCOUNTER — Inpatient Hospital Stay: Payer: 59

## 2019-01-04 ENCOUNTER — Telehealth: Payer: Self-pay | Admitting: Oncology

## 2019-01-04 ENCOUNTER — Inpatient Hospital Stay: Payer: 59 | Admitting: Oncology

## 2019-01-04 NOTE — Telephone Encounter (Signed)
Spoke with patient re lab/fu 7/14. Rescheduled from today.

## 2019-01-08 ENCOUNTER — Other Ambulatory Visit: Payer: Self-pay | Admitting: Internal Medicine

## 2019-01-08 ENCOUNTER — Other Ambulatory Visit: Payer: Self-pay | Admitting: Oncology

## 2019-01-08 DIAGNOSIS — E118 Type 2 diabetes mellitus with unspecified complications: Secondary | ICD-10-CM

## 2019-01-09 NOTE — Telephone Encounter (Signed)
Refill approved.

## 2019-01-10 ENCOUNTER — Emergency Department (HOSPITAL_COMMUNITY): Payer: 59

## 2019-01-10 ENCOUNTER — Encounter (HOSPITAL_COMMUNITY): Payer: Self-pay

## 2019-01-10 ENCOUNTER — Inpatient Hospital Stay (HOSPITAL_COMMUNITY)
Admission: EM | Admit: 2019-01-10 | Discharge: 2019-01-23 | DRG: 915 | Disposition: A | Discharge status: Skilled Nursing Facility | Payer: 59 | Attending: Internal Medicine | Admitting: Internal Medicine

## 2019-01-10 DIAGNOSIS — I82409 Acute embolism and thrombosis of unspecified deep veins of unspecified lower extremity: Secondary | ICD-10-CM | POA: Diagnosis not present

## 2019-01-10 DIAGNOSIS — T783XXD Angioneurotic edema, subsequent encounter: Secondary | ICD-10-CM | POA: Diagnosis not present

## 2019-01-10 DIAGNOSIS — Z6833 Body mass index (BMI) 33.0-33.9, adult: Secondary | ICD-10-CM

## 2019-01-10 DIAGNOSIS — T783XXA Angioneurotic edema, initial encounter: Secondary | ICD-10-CM | POA: Diagnosis not present

## 2019-01-10 DIAGNOSIS — F17210 Nicotine dependence, cigarettes, uncomplicated: Secondary | ICD-10-CM | POA: Diagnosis present

## 2019-01-10 DIAGNOSIS — L8990 Pressure ulcer of unspecified site, unspecified stage: Secondary | ICD-10-CM | POA: Insufficient documentation

## 2019-01-10 DIAGNOSIS — I6782 Cerebral ischemia: Secondary | ICD-10-CM | POA: Diagnosis present

## 2019-01-10 DIAGNOSIS — Z888 Allergy status to other drugs, medicaments and biological substances status: Secondary | ICD-10-CM

## 2019-01-10 DIAGNOSIS — J9811 Atelectasis: Secondary | ICD-10-CM | POA: Diagnosis present

## 2019-01-10 DIAGNOSIS — Z7952 Long term (current) use of systemic steroids: Secondary | ICD-10-CM

## 2019-01-10 DIAGNOSIS — W1830XA Fall on same level, unspecified, initial encounter: Secondary | ICD-10-CM | POA: Diagnosis not present

## 2019-01-10 DIAGNOSIS — Z7951 Long term (current) use of inhaled steroids: Secondary | ICD-10-CM

## 2019-01-10 DIAGNOSIS — Z86711 Personal history of pulmonary embolism: Secondary | ICD-10-CM | POA: Diagnosis not present

## 2019-01-10 DIAGNOSIS — J449 Chronic obstructive pulmonary disease, unspecified: Secondary | ICD-10-CM | POA: Diagnosis present

## 2019-01-10 DIAGNOSIS — J9600 Acute respiratory failure, unspecified whether with hypoxia or hypercapnia: Secondary | ICD-10-CM | POA: Diagnosis not present

## 2019-01-10 DIAGNOSIS — R040 Epistaxis: Secondary | ICD-10-CM | POA: Diagnosis not present

## 2019-01-10 DIAGNOSIS — Z7984 Long term (current) use of oral hypoglycemic drugs: Secondary | ICD-10-CM

## 2019-01-10 DIAGNOSIS — Z823 Family history of stroke: Secondary | ICD-10-CM

## 2019-01-10 DIAGNOSIS — Z7901 Long term (current) use of anticoagulants: Secondary | ICD-10-CM

## 2019-01-10 DIAGNOSIS — T464X5A Adverse effect of angiotensin-converting-enzyme inhibitors, initial encounter: Secondary | ICD-10-CM | POA: Diagnosis present

## 2019-01-10 DIAGNOSIS — I10 Essential (primary) hypertension: Secondary | ICD-10-CM | POA: Diagnosis present

## 2019-01-10 DIAGNOSIS — Z86718 Personal history of other venous thrombosis and embolism: Secondary | ICD-10-CM

## 2019-01-10 DIAGNOSIS — Z1159 Encounter for screening for other viral diseases: Secondary | ICD-10-CM

## 2019-01-10 DIAGNOSIS — Z809 Family history of malignant neoplasm, unspecified: Secondary | ICD-10-CM

## 2019-01-10 DIAGNOSIS — J9601 Acute respiratory failure with hypoxia: Secondary | ICD-10-CM | POA: Diagnosis not present

## 2019-01-10 DIAGNOSIS — Z8673 Personal history of transient ischemic attack (TIA), and cerebral infarction without residual deficits: Secondary | ICD-10-CM | POA: Diagnosis not present

## 2019-01-10 DIAGNOSIS — Z87898 Personal history of other specified conditions: Secondary | ICD-10-CM | POA: Diagnosis present

## 2019-01-10 DIAGNOSIS — F329 Major depressive disorder, single episode, unspecified: Secondary | ICD-10-CM | POA: Diagnosis present

## 2019-01-10 DIAGNOSIS — J9690 Respiratory failure, unspecified, unspecified whether with hypoxia or hypercapnia: Secondary | ICD-10-CM

## 2019-01-10 DIAGNOSIS — G9341 Metabolic encephalopathy: Secondary | ICD-10-CM | POA: Diagnosis not present

## 2019-01-10 DIAGNOSIS — Z923 Personal history of irradiation: Secondary | ICD-10-CM

## 2019-01-10 DIAGNOSIS — E876 Hypokalemia: Secondary | ICD-10-CM | POA: Diagnosis not present

## 2019-01-10 DIAGNOSIS — E1151 Type 2 diabetes mellitus with diabetic peripheral angiopathy without gangrene: Secondary | ICD-10-CM | POA: Diagnosis present

## 2019-01-10 DIAGNOSIS — Z8619 Personal history of other infectious and parasitic diseases: Secondary | ICD-10-CM

## 2019-01-10 DIAGNOSIS — C7951 Secondary malignant neoplasm of bone: Secondary | ICD-10-CM | POA: Diagnosis present

## 2019-01-10 DIAGNOSIS — Z7982 Long term (current) use of aspirin: Secondary | ICD-10-CM

## 2019-01-10 DIAGNOSIS — L89159 Pressure ulcer of sacral region, unspecified stage: Secondary | ICD-10-CM | POA: Diagnosis not present

## 2019-01-10 DIAGNOSIS — Z79899 Other long term (current) drug therapy: Secondary | ICD-10-CM

## 2019-01-10 DIAGNOSIS — K567 Ileus, unspecified: Secondary | ICD-10-CM

## 2019-01-10 DIAGNOSIS — Z8546 Personal history of malignant neoplasm of prostate: Secondary | ICD-10-CM

## 2019-01-10 DIAGNOSIS — D72829 Elevated white blood cell count, unspecified: Secondary | ICD-10-CM | POA: Diagnosis not present

## 2019-01-10 DIAGNOSIS — Z8249 Family history of ischemic heart disease and other diseases of the circulatory system: Secondary | ICD-10-CM

## 2019-01-10 DIAGNOSIS — L89309 Pressure ulcer of unspecified buttock, unspecified stage: Secondary | ICD-10-CM | POA: Diagnosis not present

## 2019-01-10 DIAGNOSIS — B20 Human immunodeficiency virus [HIV] disease: Secondary | ICD-10-CM | POA: Diagnosis present

## 2019-01-10 DIAGNOSIS — G9340 Encephalopathy, unspecified: Secondary | ICD-10-CM | POA: Diagnosis not present

## 2019-01-10 DIAGNOSIS — Z978 Presence of other specified devices: Secondary | ICD-10-CM

## 2019-01-10 LAB — CBC WITH DIFFERENTIAL/PLATELET
Abs Immature Granulocytes: 0.14 10*3/uL — ABNORMAL HIGH (ref 0.00–0.07)
Basophils Absolute: 0 10*3/uL (ref 0.0–0.1)
Basophils Relative: 0 %
Eosinophils Absolute: 0.1 10*3/uL (ref 0.0–0.5)
Eosinophils Relative: 1 %
HCT: 34.2 % — ABNORMAL LOW (ref 39.0–52.0)
Hemoglobin: 10.6 g/dL — ABNORMAL LOW (ref 13.0–17.0)
Immature Granulocytes: 1 %
Lymphocytes Relative: 5 %
Lymphs Abs: 0.8 10*3/uL (ref 0.7–4.0)
MCH: 27.7 pg (ref 26.0–34.0)
MCHC: 31 g/dL (ref 30.0–36.0)
MCV: 89.5 fL (ref 80.0–100.0)
Monocytes Absolute: 0.3 10*3/uL (ref 0.1–1.0)
Monocytes Relative: 2 %
Neutro Abs: 14 10*3/uL — ABNORMAL HIGH (ref 1.7–7.7)
Neutrophils Relative %: 91 %
Platelets: 217 10*3/uL (ref 150–400)
RBC: 3.82 MIL/uL — ABNORMAL LOW (ref 4.22–5.81)
RDW: 14.6 % (ref 11.5–15.5)
WBC: 15.4 10*3/uL — ABNORMAL HIGH (ref 4.0–10.5)
nRBC: 0 % (ref 0.0–0.2)

## 2019-01-10 LAB — BASIC METABOLIC PANEL
Anion gap: 14 (ref 5–15)
BUN: 17 mg/dL (ref 8–23)
CO2: 21 mmol/L — ABNORMAL LOW (ref 22–32)
Calcium: 9.1 mg/dL (ref 8.9–10.3)
Chloride: 105 mmol/L (ref 98–111)
Creatinine, Ser: 1.45 mg/dL — ABNORMAL HIGH (ref 0.61–1.24)
GFR calc Af Amer: 60 mL/min — ABNORMAL LOW (ref >60)
GFR calc non Af Amer: 52 mL/min — ABNORMAL LOW (ref >60)
Glucose, Bld: 235 mg/dL — ABNORMAL HIGH (ref 70–99)
Potassium: 3.8 mmol/L (ref 3.5–5.1)
Sodium: 140 mmol/L (ref 135–145)

## 2019-01-10 LAB — HEPARIN LEVEL (UNFRACTIONATED)
Heparin Unfractionated: 0.69 IU/mL (ref 0.30–0.70)
Heparin Unfractionated: 0.84 IU/mL — ABNORMAL HIGH (ref 0.30–0.70)
Heparin Unfractionated: 1.03 IU/mL — ABNORMAL HIGH (ref 0.30–0.70)

## 2019-01-10 LAB — ABO/RH
ABO/RH(D): B POS
ABO/RH(D): B POS

## 2019-01-10 LAB — CBC
HCT: 30.6 % — ABNORMAL LOW (ref 39.0–52.0)
Hemoglobin: 9.8 g/dL — ABNORMAL LOW (ref 13.0–17.0)
MCH: 27.9 pg (ref 26.0–34.0)
MCHC: 32 g/dL (ref 30.0–36.0)
MCV: 87.2 fL (ref 80.0–100.0)
Platelets: 208 10*3/uL (ref 150–400)
RBC: 3.51 MIL/uL — ABNORMAL LOW (ref 4.22–5.81)
RDW: 14.6 % (ref 11.5–15.5)
WBC: 14.4 10*3/uL — ABNORMAL HIGH (ref 4.0–10.5)
nRBC: 0 % (ref 0.0–0.2)

## 2019-01-10 LAB — POCT I-STAT 7, (LYTES, BLD GAS, ICA,H+H)
Acid-base deficit: 5 mmol/L — ABNORMAL HIGH (ref 0.0–2.0)
Bicarbonate: 21 mmol/L (ref 20.0–28.0)
Calcium, Ion: 1.3 mmol/L (ref 1.15–1.40)
HCT: 34 % — ABNORMAL LOW (ref 39.0–52.0)
Hemoglobin: 11.6 g/dL — ABNORMAL LOW (ref 13.0–17.0)
O2 Saturation: 95 %
Potassium: 4.4 mmol/L (ref 3.5–5.1)
Sodium: 138 mmol/L (ref 135–145)
TCO2: 22 mmol/L (ref 22–32)
pCO2 arterial: 41.7 mmHg (ref 32.0–48.0)
pH, Arterial: 7.311 — ABNORMAL LOW (ref 7.350–7.450)
pO2, Arterial: 84 mmHg (ref 83.0–108.0)

## 2019-01-10 LAB — BPAM FFP
Blood Product Expiration Date: 202006282359
Unit Type and Rh: 7300

## 2019-01-10 LAB — TYPE AND SCREEN
ABO/RH(D): B POS
Antibody Screen: NEGATIVE

## 2019-01-10 LAB — PREPARE FRESH FROZEN PLASMA: Unit division: 0

## 2019-01-10 LAB — GLUCOSE, CAPILLARY
Glucose-Capillary: 136 mg/dL — ABNORMAL HIGH (ref 70–99)
Glucose-Capillary: 141 mg/dL — ABNORMAL HIGH (ref 70–99)
Glucose-Capillary: 163 mg/dL — ABNORMAL HIGH (ref 70–99)
Glucose-Capillary: 192 mg/dL — ABNORMAL HIGH (ref 70–99)
Glucose-Capillary: 223 mg/dL — ABNORMAL HIGH (ref 70–99)
Glucose-Capillary: 235 mg/dL — ABNORMAL HIGH (ref 70–99)

## 2019-01-10 LAB — HEMOGLOBIN A1C
Hgb A1c MFr Bld: 7 % — ABNORMAL HIGH (ref 4.8–5.6)
Mean Plasma Glucose: 154.2 mg/dL

## 2019-01-10 LAB — T-HELPER CELLS (CD4) COUNT (NOT AT ARMC)
CD4 % Helper T Cell: 20 % — ABNORMAL LOW (ref 33–65)
CD4 T Cell Abs: 104 /uL — ABNORMAL LOW (ref 400–1790)

## 2019-01-10 LAB — SARS CORONAVIRUS 2 BY RT PCR (HOSPITAL ORDER, PERFORMED IN ~~LOC~~ HOSPITAL LAB): SARS Coronavirus 2: NEGATIVE

## 2019-01-10 LAB — APTT
aPTT: 25 seconds (ref 24–36)
aPTT: 60 seconds — ABNORMAL HIGH (ref 24–36)
aPTT: 79 seconds — ABNORMAL HIGH (ref 24–36)

## 2019-01-10 LAB — MRSA PCR SCREENING: MRSA by PCR: NEGATIVE

## 2019-01-10 LAB — PROTIME-INR
INR: 1.1 (ref 0.8–1.2)
Prothrombin Time: 14.2 seconds (ref 11.4–15.2)

## 2019-01-10 MED ORDER — PROPOFOL 1000 MG/100ML IV EMUL
5.0000 ug/kg/min | INTRAVENOUS | Status: DC
  Administered 2019-01-10 (×3): 50 ug/kg/min via INTRAVENOUS
  Filled 2019-01-10 (×3): qty 100

## 2019-01-10 MED ORDER — PROPOFOL 1000 MG/100ML IV EMUL
INTRAVENOUS | Status: AC
  Administered 2019-01-10: 50 ug/kg/min via INTRAVENOUS
  Filled 2019-01-10: qty 100

## 2019-01-10 MED ORDER — PROPOFOL 1000 MG/100ML IV EMUL
0.0000 ug/kg/min | INTRAVENOUS | Status: DC
  Administered 2019-01-10 – 2019-01-13 (×22): 50 ug/kg/min via INTRAVENOUS
  Filled 2019-01-10 (×24): qty 100

## 2019-01-10 MED ORDER — KETAMINE HCL 10 MG/ML IJ SOLN
INTRAMUSCULAR | Status: AC
  Filled 2019-01-10: qty 1

## 2019-01-10 MED ORDER — SUCCINYLCHOLINE CHLORIDE 20 MG/ML IJ SOLN
INTRAMUSCULAR | Status: AC | PRN
  Administered 2019-01-10: 100 mg via INTRAVENOUS

## 2019-01-10 MED ORDER — DIPHENHYDRAMINE HCL 50 MG/ML IJ SOLN: 25.0000 mg | Freq: Four times a day (QID) | INTRAMUSCULAR | Status: DC | PRN

## 2019-01-10 MED ORDER — FENTANYL CITRATE (PF) 100 MCG/2ML IJ SOLN
50.0000 ug | Freq: Once | INTRAMUSCULAR | Status: AC
  Administered 2019-01-10: 50 ug via INTRAVENOUS

## 2019-01-10 MED ORDER — KETAMINE HCL 10 MG/ML IJ SOLN
INTRAMUSCULAR | Status: AC | PRN
  Administered 2019-01-10 (×2): 100 mg via INTRAVENOUS

## 2019-01-10 MED ORDER — SODIUM CHLORIDE 0.9 % IV SOLN
INTRAVENOUS | Status: DC
  Administered 2019-01-10 – 2019-01-15 (×7): via INTRAVENOUS

## 2019-01-10 MED ORDER — DIPHENHYDRAMINE HCL 50 MG/ML IJ SOLN
25.0000 mg | Freq: Four times a day (QID) | INTRAMUSCULAR | Status: DC
  Administered 2019-01-10 – 2019-01-14 (×17): 25 mg via INTRAVENOUS
  Filled 2019-01-10 (×16): qty 1

## 2019-01-10 MED ORDER — KETAMINE HCL 50 MG/ML IJ SOLN
INTRAMUSCULAR | Status: AC
  Administered 2019-01-10: 03:00:00
  Filled 2019-01-10: qty 10

## 2019-01-10 MED ORDER — CHLORHEXIDINE GLUCONATE CLOTH 2 % EX PADS
6.0000 | MEDICATED_PAD | Freq: Every day | CUTANEOUS | Status: DC
  Administered 2019-01-10 – 2019-01-23 (×13): 6 via TOPICAL

## 2019-01-10 MED ORDER — ORAL CARE MOUTH RINSE
15.0000 mL | OROMUCOSAL | Status: DC
  Administered 2019-01-10 – 2019-01-13 (×29): 15 mL via OROMUCOSAL

## 2019-01-10 MED ORDER — METHYLPREDNISOLONE SODIUM SUCC 125 MG IJ SOLR
40.0000 mg | Freq: Three times a day (TID) | INTRAMUSCULAR | Status: DC
  Administered 2019-01-10 – 2019-01-14 (×13): 40 mg via INTRAVENOUS
  Filled 2019-01-10 (×13): qty 2

## 2019-01-10 MED ORDER — SODIUM CHLORIDE 0.9% IV SOLUTION
Freq: Once | INTRAVENOUS | Status: AC
  Administered 2019-01-10: 10:00:00 via INTRAVENOUS

## 2019-01-10 MED ORDER — CHLORHEXIDINE GLUCONATE 0.12% ORAL RINSE (MEDLINE KIT)
15.0000 mL | Freq: Two times a day (BID) | OROMUCOSAL | Status: DC
  Administered 2019-01-10 – 2019-01-13 (×7): 15 mL via OROMUCOSAL

## 2019-01-10 MED ORDER — HEPARIN (PORCINE) 25000 UT/250ML-% IV SOLN
1750.0000 [IU]/h | INTRAVENOUS | Status: AC
  Administered 2019-01-10: 1400 [IU]/h via INTRAVENOUS
  Administered 2019-01-11 – 2019-01-13 (×5): 1600 [IU]/h via INTRAVENOUS
  Administered 2019-01-14: 13:00:00 1750 [IU]/h via INTRAVENOUS
  Filled 2019-01-10 (×7): qty 250

## 2019-01-10 MED ORDER — INSULIN ASPART 100 UNIT/ML ~~LOC~~ SOLN
0.0000 [IU] | SUBCUTANEOUS | Status: DC
  Administered 2019-01-10: 3 [IU] via SUBCUTANEOUS
  Administered 2019-01-10: 4 [IU] via SUBCUTANEOUS
  Administered 2019-01-10: 7 [IU] via SUBCUTANEOUS
  Administered 2019-01-10: 3 [IU] via SUBCUTANEOUS
  Administered 2019-01-10: 4 [IU] via SUBCUTANEOUS
  Administered 2019-01-11 – 2019-01-12 (×9): 3 [IU] via SUBCUTANEOUS
  Administered 2019-01-13 (×2): 4 [IU] via SUBCUTANEOUS
  Administered 2019-01-13 – 2019-01-14 (×3): 3 [IU] via SUBCUTANEOUS
  Administered 2019-01-14: 4 [IU] via SUBCUTANEOUS
  Administered 2019-01-14 – 2019-01-16 (×6): 3 [IU] via SUBCUTANEOUS
  Administered 2019-01-17: 4 [IU] via SUBCUTANEOUS
  Administered 2019-01-17: 3 [IU] via SUBCUTANEOUS
  Administered 2019-01-17 (×2): 4 [IU] via SUBCUTANEOUS
  Administered 2019-01-17 – 2019-01-18 (×2): 3 [IU] via SUBCUTANEOUS
  Administered 2019-01-18: 4 [IU] via SUBCUTANEOUS
  Administered 2019-01-19: 11 [IU] via SUBCUTANEOUS
  Administered 2019-01-19 (×3): 3 [IU] via SUBCUTANEOUS
  Administered 2019-01-20: 4 [IU] via SUBCUTANEOUS
  Administered 2019-01-20: 3 [IU] via SUBCUTANEOUS
  Administered 2019-01-20: 4 [IU] via SUBCUTANEOUS

## 2019-01-10 MED ORDER — FENTANYL 2500MCG IN NS 250ML (10MCG/ML) PREMIX INFUSION
50.0000 ug/h | INTRAVENOUS | Status: DC
  Administered 2019-01-10: 50 ug/h via INTRAVENOUS
  Administered 2019-01-11: 200 ug/h via INTRAVENOUS
  Administered 2019-01-11: 10:00:00 150 ug/h via INTRAVENOUS
  Administered 2019-01-12 (×2): 200 ug/h via INTRAVENOUS
  Filled 2019-01-10 (×6): qty 250

## 2019-01-10 MED ORDER — HEPARIN SODIUM (PORCINE) 5000 UNIT/ML IJ SOLN: 5000.0000 [IU] | Freq: Three times a day (TID) | INTRAMUSCULAR | Status: DC

## 2019-01-10 MED ORDER — FAMOTIDINE IN NACL 20-0.9 MG/50ML-% IV SOLN
20.0000 mg | Freq: Once | INTRAVENOUS | Status: AC
  Administered 2019-01-10: 20 mg via INTRAVENOUS
  Filled 2019-01-10: qty 50

## 2019-01-10 MED ORDER — FENTANYL BOLUS VIA INFUSION
50.0000 ug | INTRAVENOUS | Status: DC | PRN
  Administered 2019-01-11 – 2019-01-13 (×18): 50 ug via INTRAVENOUS
  Filled 2019-01-10: qty 50

## 2019-01-10 MED ORDER — VECURONIUM BROMIDE 10 MG IV SOLR
10.0000 mg | Freq: Once | INTRAVENOUS | Status: AC
  Administered 2019-01-10: 10 mg via INTRAVENOUS
  Filled 2019-01-10: qty 10

## 2019-01-10 MED ORDER — FAMOTIDINE IN NACL 20-0.9 MG/50ML-% IV SOLN
20.0000 mg | Freq: Two times a day (BID) | INTRAVENOUS | Status: DC
  Administered 2019-01-10 – 2019-01-14 (×9): 20 mg via INTRAVENOUS
  Filled 2019-01-10 (×9): qty 50

## 2019-01-10 NOTE — ED Triage Notes (Signed)
Pt on lisinopril started about an hour ago. Angioedema to the tongue. 50 benadryl. 125 solumedrol. 1 epi.22g r hand

## 2019-01-10 NOTE — Sedation Documentation (Signed)
New airway placed

## 2019-01-10 NOTE — H&P (Addendum)
NAME:  Jacob Joseph, MRN:  [id number], DOB:  [birth year], LOS: 0 ADMISSION DATE:  01/10/2019, CONSULTATION DATE: 6/23 REFERRING MD: Dr. Betsey Holiday EDP, CHIEF COMPLAINT: Angioedema  Brief History   63 year old male presented to the emergency department with tongue swelling shortly after starting on lisinopril.  Nasally intubated in the emergency department transferred to [facility].  History of present illness   Patient is encephalopathic and/or intubated. Therefore history has been obtained from chart review. 63 year old with past medical history as below which is significant for HIV, metastatic prostate cancer, COPD, diabetes mellitus, hepatitis C, PE on anticoagulation, and hypertension.  Prostate cancer is metastatic to multiple sites including the thoracic spine.  He is currently managed on palliative Zytiga and is status post radiation therapy to the spine.  He has been on anticoagulation since 2017 after a second unprovoked PE.  He presented to [facility] emergency department on 6/22 with chief complaint of tongue swelling.  He took lisinopril about 1 hour prior which is apparently new medication for him.  Tongue was significantly swollen upon EMS arrival and he was given Benadryl, Solu-Medrol, and epinephrine in route to the hospital.  Upon arrival to the emergency department his tongue was protruding and he cannot control his secretions.  Endotracheal intubation was attempted however airway was very difficult.  He was nasally intubated by the emergency room physician.  He was then transferred to [facility] for ICU admission.  Past Medical History   has a past medical history of AKI (acute kidney injury) ([place]) (09/24/2016), Calcium oxalate crystals in urine (12/23/2011), COPD (chronic obstructive pulmonary disease) ([place]), Depression, Diabetes mellitus without complication ([place]) (3295), Hepatitis C, HIV (human immunodeficiency virus infection) ([place]), Hypertension, PE (pulmonary  thromboembolism) ([name]) (05/12/2016), Peripheral arterial disease ([place]), Prostate cancer metastatic to multiple sites [facility]), Stroke ([place]) (11/2011), and TB lung, latent (1988).   Significant Hospital Events   6/23 admit  Consults:    Procedures:  NTT (right nare) 6/23>  Significant Diagnostic Tests:    Micro Data:    Antimicrobials:     Interim history/subjective:    Objective   Blood pressure 108/77, pulse 94, resp. rate (!) 24, height 5' 9"  (1.753 m), weight 100.5 kg, SpO2 100 %.    Vent Mode: PRVC FiO2 (%):  [60 %-100 %] 60 % Set Rate:  [16 bmp] 16 bmp Vt Set:  [560 mL] 560 mL PEEP:  [5 cmH20] 5 cmH20 Plateau Pressure:  [17 cmH20-20 cmH20] 17 cmH20  No intake or output data in the 24 hours ending 01/10/19 0558 Filed Weights   01/10/19 0008  Weight: 100.5 kg    Examination: General: Morbidly obese male on vent  HENT: edematous tongue protruding from mouth.  Lungs: Clear Cardiovascular: Mildly tachy. Refular, no MRG Abdomen: Soft, non-tender, non-distended Extremities: No acute deformity Neuro: Sedated  Resolved Hospital Problem list     Assessment & Plan:   Angioedema: likely secondary to ACE-I.  - Full vent support - Do not wake updue to airway difficulty. RASS goal -3 to -4. (propofol and fentanyl) - C3 and C4  - Give FFP 2 units - Pepcid, benadryl, solu-medrol. (hold home prednisone) - Discontinue lisinopril  Prostate CA: with metastases to thoracic spine  - Hold daily palliative Zytiga for now  HIV Hepatitis C - Hold home Biktarvy untial able to take PO - CD4 count  DM - hold home metformin - CBG monitoring and SSI  PE history 2017 unprovoked now on livelong AC - heparin infusion per  pharmacy in lieu of home eliquis - Check coags  COPD  - Duoneb, budesonide   Best practice:  Diet: NPO Pain/Anxiety/Delirium protocol (if indicated): propofol/fentanyl VAP protocol (if indicated): Yes DVT prophylaxis: full dose GI prophylaxis:  PPI Glucose control: SSI Mobility: BR Code Status: FULL Family Communication: not available Disposition: ICU  Labs   CBC: Recent Labs  Lab 01/10/19 0101  WBC 15.4*  NEUTROABS 14.0*  HGB 10.6*  HCT 34.2*  MCV 89.5  PLT 409    Basic Metabolic Panel: Recent Labs  Lab 01/10/19 0101  NA 140  K 3.8  CL 105  CO2 21*  GLUCOSE 235*  BUN 17  CREATININE 1.45*  CALCIUM 9.1   GFR: Estimated Creatinine Clearance: 62.5 mL/min (A) (by C-G formula based on SCr of 1.45 mg/dL (H)). Recent Labs  Lab 01/10/19 0101  WBC 15.4*    Liver Function Tests: No results for input(s): AST, ALT, ALKPHOS, BILITOT, PROT, ALBUMIN in the last 168 hours. No results for input(s): LIPASE, AMYLASE in the last 168 hours. No results for input(s): AMMONIA in the last 168 hours.  ABG    Component Value Date/Time   PHART 7.359 08/31/2018 1004   PCO2ART 41.6 08/31/2018 1004   PO2ART 149 (H) 08/31/2018 1004   HCO3 22.8 08/31/2018 1004   TCO2 18 11/18/2013 1750   ACIDBASEDEF 1.8 08/31/2018 1004   O2SAT 98.5 08/31/2018 1004     Coagulation Profile: No results for input(s): INR, PROTIME in the last 168 hours.  Cardiac Enzymes: No results for input(s): CKTOTAL, CKMB, CKMBINDEX, TROPONINI in the last 168 hours.  HbA1C: Hemoglobin A1C  Date/Time Value Ref Range Status  05/26/2018 01:57 PM 6.0 (A) 4.0 - 5.6 % Final  03/03/2018 02:02 PM 6.1 (A) 4.0 - 5.6 % Final   Hgb A1c MFr Bld  Date/Time Value Ref Range Status  03/08/2012 09:06 PM 5.9 (H) <5.7 % Final    Comment:    (NOTE)                                                                       According to the ADA Clinical Practice Recommendations for 2011, when HbA1c is used as a screening test:  >=6.5%   Diagnostic of Diabetes Mellitus           (if abnormal result is confirmed) 5.7-6.4%   Increased risk of developing Diabetes Mellitus References:Diagnosis and Classification of Diabetes Mellitus,Diabetes Care,2011,34(Suppl 1):S62-S69 and  Standards of Medical Care in         Diabetes - 2011,Diabetes WJXB,1478,29 (Suppl 1):S11-S61.  12/03/2011 03:04 PM 5.6 <5.7 % Final    Comment:    (NOTE)                                                                       According to the ADA Clinical Practice Recommendations for 2011, when HbA1c is used as a screening test:  >=6.5%   Diagnostic of Diabetes Mellitus           (  if abnormal result is confirmed) 5.7-6.4%   Increased risk of developing Diabetes Mellitus References:Diagnosis and Classification of Diabetes Mellitus,Diabetes FKCL,2751,70(YFVCB 1):S62-S69 and Standards of Medical Care in         Diabetes - 2011,Diabetes SWHQ,7591,63 (Suppl 1):S11-S61.    CBG: No results for input(s): GLUCAP in the last 168 hours.  Review of Systems:   Unable as patient is encephalopathic and intubated  Past Medical History  He,  has a past medical history of AKI (acute kidney injury) ([place]) (09/24/2016), Calcium oxalate crystals in urine (12/23/2011), COPD (chronic obstructive pulmonary disease) ([place]), Depression, Diabetes mellitus without complication ([place]) (8466), Hepatitis C, HIV (human immunodeficiency virus infection) ([place]), Hypertension, PE (pulmonary thromboembolism) ([place]) (05/12/2016), Peripheral arterial disease ([place]), Prostate cancer metastatic to multiple sites [facility]), Stroke ([place]) (11/2011), and TB lung, latent (1988).   Surgical History    Past Surgical History:  Procedure Laterality Date  . HERNIA REPAIR    . INGUINAL HERNIA REPAIR  01/16/2012   Procedure: HERNIA REPAIR INGUINAL INCARCERATED;  Surgeon: Zenovia Jarred, MD;  Location: [place];  Service: General;  Laterality: Right;  . TEE WITHOUT CARDIOVERSION  12/07/2011   Procedure: TRANSESOPHAGEAL ECHOCARDIOGRAM (TEE);  Surgeon: Birdie Riddle, MD;  Location: [place];  Service: Cardiovascular;  Laterality: N/A;     Social History   reports that he has been smoking cigarettes. He has been smoking about 0.45 packs per day. He  has never used smokeless tobacco. He reports current drug use. Frequency: 3.00 times per week. Drugs: "Crack" cocaine and Cocaine. He reports that he does not drink alcohol.   Family History   His family history includes Cancer in his father; Heart attack in his mother; Hypertension in his father and mother; Stroke in his mother.   Allergies Allergies  Allergen Reactions  . Statins Other (See Comments)    Elevated liver enzymes.     Home Medications  Prior to Admission medications   Medication Sig Start Date End Date Taking? Authorizing Provider  abiraterone acetate (ZYTIGA) 250 MG tablet TAKE 4 TABLETS (1,000 MG TOTAL) BY MOUTH DAILY. TAKE ON AN EMPTY STOMACH 1 HOUR BEFORE OR 2 HOURS AFTER A MEAL 12/20/18   [name], MD  albuterol (PROAIR HFA) 108 (90 Base) MCG/ACT inhaler INHALE 2 PUFFS INTO THE LUNGS EVERY 6 HOURS AS NEEDED FOR WHEEZING OR SHORTNESS OF BREATH, TO NOT USE EVERY DAY, USE ONLY IF NEEDED 01/07/17   Axel Filler, MD  albuterol (PROVENTIL) (2.5 MG/3ML) 0.083% nebulizer solution Take 3 mLs (2.5 mg total) by nebulization every 6 (six) hours as needed for wheezing or shortness of breath. 09/03/18   Kathie Dike, MD  apixaban (ELIQUIS) 2.5 MG TABS tablet Take 1 tablet (2.5 mg total) by mouth 2 (two) times daily. 04/28/18   Neva Seat, MD  aspirin 81 MG EC tablet Take 1 tablet (81 mg total) by mouth daily. 01/27/16   Burgess Estelle, MD  azelastine (ASTELIN) 0.1 % nasal spray USE 1 SPRAY IN EACH NOSTRIL TWICE DAILY AS NEEDED FOR RHINITIS 05/25/18   Neva Seat, MD  bictegravir-emtricitabine-tenofovir AF (BIKTARVY) 50-200-25 MG TABS tablet Take 1 tablet by mouth daily. 12/21/18   Michel Bickers, MD  Blood Glucose Monitoring Suppl [facility] VERIO) w/Device KIT 1 Device by Does not apply route 2 (two) times daily. ICD 10  E11.8, non-insulin dependent, test up to twice daily 09/03/18   Kathie Dike, MD  Budesonide 90 MCG/ACT inhaler Inhale 2 puffs into the lungs  2 (two) times daily.  09/03/18   Kathie Dike, MD  calcium gluconate 500 MG tablet Take 1 tablet (500 mg total) by mouth daily. 04/12/18   [name], MD  cholecalciferol (VITAMIN D) 1000 units tablet Take 1 tablet (1,000 Units total) by mouth daily. 01/27/16   Burgess Estelle, MD  ciclopirox (LOPROX) 0.77 % cream Apply topically 2 (two) times daily. Apply to both feet and between toes bid x 4 weeks. 01/03/19   Marzetta Board, DPM  citalopram (CELEXA) 20 MG tablet Take 20 mg by mouth daily. 10/25/14   [provider]  diclofenac sodium (VOLTAREN) 1 % GEL APPLY 4 GRAMS EXTERNALLY TO THE AFFECTED AREA FOUR TIMES DAILY AS NEEDED FOR PAIN 10/21/18   Neva Seat, MD  FLOVENT [facility] 220 MCG/ACT inhaler USE 1 PUFF PO BID 09/06/18   [provider]  gabapentin (NEURONTIN) 300 MG capsule TAKE 2 CAPSULES(600 MG) BY MOUTH TWICE DAILY 07/28/18   Neva Seat, MD  glucose blood [facility] VERIO) test strip Use as instructed ICD 10  E11.8, non-insulin dependent, test up to twice daily 09/03/18   Kathie Dike, MD  guaiFENesin (MUCINEX) 600 MG 12 hr tablet Take 2 tablets (1,200 mg total) by mouth 2 (two) times daily. 09/03/18   Kathie Dike, MD  hydroxypropyl methylcellulose / hypromellose (ISOPTO TEARS / GONIOVISC) 2.5 % ophthalmic solution Place 1 drop into both eyes as needed for dry eyes.    [provider]  hydrOXYzine (VISTARIL) 50 MG capsule Take 50 mg by mouth at bedtime.  01/21/18   [provider]  lisinopril (ZESTRIL) 2.5 MG tablet TAKE 1 TABLET(2.5 MG) BY MOUTH DAILY 11/14/18   Aldine Contes, MD  metFORMIN (GLUCOPHAGE) 500 MG tablet TAKE 1 TABLET BY MOUTH EVERY DAY WITH BREAKFAST 01/09/19   Neva Seat, MD  nicotine (NICODERM CQ - DOSED IN MG/24 HOURS) 21 mg/24hr patch Place 1 patch (21 mg total) onto the skin daily. 09/04/18   Kathie Dike, MD  [facility] DELICA LANCETS FINE MISC 1 Stick by Does not apply route 2 (two) times daily. ICD 10  E11.8,  non-insulin dependent, test up to twice daily 09/03/18   Kathie Dike, MD  pravastatin (PRAVACHOL) 40 MG tablet TAKE 1 TABLET(40 MG) BY MOUTH DAILY 06/30/18   Neva Seat, MD  predniSONE (DELTASONE) 5 MG tablet TAKE 1 TABLET(5 MG) BY MOUTH DAILY WITH BREAKFAST 01/08/19   [name], MD  QUEtiapine (SEROQUEL) 300 MG tablet Take 300 mg by mouth 2 (two) times daily.  11/11/15   [provider]  sucralfate (CARAFATE) 1 g tablet Take 1 tablet (1 g total) by mouth 4 (four) times daily. 04/18/18   Kyung Rudd, MD  tamsulosin (FLOMAX) 0.4 MG CAPS capsule TAKE 1 CAPSULE(0.4 MG) BY MOUTH DAILY AFTER BREAKFAST 10/21/18   Neva Seat, MD  Tiotropium Bromide-Olodaterol (STIOLTO RESPIMAT) 2.5-2.5 MCG/ACT AERS Inhale 2 puffs into the lungs daily. 09/03/18   Kathie Dike, MD  valACYclovir (VALTREX) 500 MG tablet TAKE 1 TABLET BY MOUTH DAILY 08/29/18   Michel Bickers, MD     Critical care time: 64 mins     Georgann Housekeeper, AGACNP-BC [place] Pager [phone] or [phone]  01/10/2019 6:25 AM  Patient seen and examined, agree with above note.  I dictated the care and orders written for this patient under my direction. angiodema 2/2 ACE-I, nasally intubated Keep well sedated , needs time Antihistamines/steroids  Roxanne Mins, [place]

## 2019-01-10 NOTE — Sedation Documentation (Signed)
xr in room. xr completed

## 2019-01-10 NOTE — Final Progress Note (Signed)
4 total attempts made to pass feeding tube.  2 OG and 2 NG by night RN and this day RN. Unsuccessful, CCM made aware.

## 2019-01-10 NOTE — ED Provider Notes (Signed)
[facility] Provider Note   CSN: [id number] Arrival date & time: 01/10/19  0005    History   Chief Complaint Chief Complaint  Patient presents with  . Angioedema    HPI Jacob Joseph is a 63 y.o. male.     Patient presents to the emergency department for evaluation of tongue swelling.  Patient reports that approximate hour before calling EMS he started to notice that his tongue was swelling and it progressively worsened.  EMS administered Solu-Medrol 125 mg, Benadryl 50 mg and epinephrine 0.5 mg prior to arrival.  Patient noted to be in distress, tongue protruding from his mouth, drooling and unable to speak at arrival. Level V Caveat due to acuity.     Past Medical History:  Diagnosis Date  . AKI (acute kidney injury) ([place]) 09/24/2016  . Calcium oxalate crystals in urine 12/23/2011   Asymptomatic, no hematuria. Advised to take plenty of water.  [name] Kitchen COPD (chronic obstructive pulmonary disease) ([place])   . Depression   . Diabetes mellitus without complication ([place]) 4315   pre diabetic  . Hepatitis C   . HIV (human immunodeficiency virus infection) ([place])   . Hypertension   . PE (pulmonary thromboembolism) ([place]) 05/12/2016   PE in 2017, on chronic anticoagulation for this and recurrent DVT.  [name] Kitchen Peripheral arterial disease ([place])    ooccluded left SFA by duplex ultrasound, tibial disease left greater than right  . Prostate cancer metastatic to multiple sites [facility])   . Stroke ([place]) 11/2011   Carotids Doppler negative. Right sided weakness resolved, initially on Plavix for 3 months and then continued with ASA.    . TB lung, latent 1988   Treated    Patient Active Problem List   Diagnosis Date Noted  . Leukocytosis 09/01/2018  . Acute respiratory failure with hypoxia ([place]) 08/31/2018  . Severe sepsis ([place]) 08/31/2018  . Closed fracture of proximal end of left humerus with delayed healing 05/26/2018  . Spine metastasis ([place]) 03/24/2018  . Acute deep vein  thrombosis (DVT) of right popliteal vein ([place])   . Prostate cancer metastatic to multiple sites [facility])   . Radicular pain of thoracic region   . Bone lesion   . Calculus of gallbladder without cholecystitis without obstruction   . Chronic anticoagulation   . Right upper quadrant pain 02/26/2018  . Abnormal computed tomography of thoracic spine 02/26/2018  . Recurrent falls 11/02/2016  . History of prostate cancer 09/22/2016  . BPH associated with nocturia 06/19/2016  . Substance abuse ([place]) 01/27/2016  . Vitamin D deficiency 12/05/2015  . Hyperlipidemia 01/28/2015  . Cataracts, bilateral 08/29/2014  . Diabetes mellitus treated with oral medication ([place]) 10/19/2013  . COPD, moderate ([place]) 05/31/2013  . Complex Lt Renal Cyst 03/22/2013  . Preventive measure 05/30/2012  . History of CVA 03/08/2012  . Normocytic anemia 03/08/2012  . CKD (chronic kidney disease) stage 3, GFR 30-59 ml/min (HCC) 03/08/2012  . ERECTILE DYSFUNCTION 04/25/2008  . HIV disease ([place]) 04/27/2006  . Smoking greater than 30 pack years 04/27/2006  . Major depressive disorder, recurrent episode ([place]) 04/27/2006  . Hypertension 04/27/2006  . DEGENERATIVE JOINT DISEASE 04/27/2006  . Chronic Low Back Pain 04/27/2006    Past Surgical History:  Procedure Laterality Date  . HERNIA REPAIR    . INGUINAL HERNIA REPAIR  01/16/2012   Procedure: HERNIA REPAIR INGUINAL INCARCERATED;  Surgeon: Zenovia Jarred, MD;  Location: [facility];  Service: General;  Laterality: Right;  . TEE WITHOUT CARDIOVERSION  12/07/2011  Procedure: TRANSESOPHAGEAL ECHOCARDIOGRAM (TEE);  Surgeon: Birdie Riddle, MD;  [facility] ENDOSCOPY;  Service: Cardiovascular;  Laterality: N/A;        Home Medications    Prior to Admission medications   Medication Sig Start Date End Date Taking? Authorizing Provider  abiraterone acetate (ZYTIGA) 250 MG tablet TAKE 4 TABLETS (1,000 MG TOTAL) BY MOUTH DAILY. TAKE ON AN EMPTY STOMACH 1 HOUR BEFORE OR 2 HOURS  AFTER A MEAL 12/20/18   [name], MD  albuterol (PROAIR HFA) 108 (90 Base) MCG/ACT inhaler INHALE 2 PUFFS INTO THE LUNGS EVERY 6 HOURS AS NEEDED FOR WHEEZING OR SHORTNESS OF BREATH, TO NOT USE EVERY DAY, USE ONLY IF NEEDED 01/07/17   Axel Filler, MD  albuterol (PROVENTIL) (2.5 MG/3ML) 0.083% nebulizer solution Take 3 mLs (2.5 mg total) by nebulization every 6 (six) hours as needed for wheezing or shortness of breath. 09/03/18   Kathie Dike, MD  apixaban (ELIQUIS) 2.5 MG TABS tablet Take 1 tablet (2.5 mg total) by mouth 2 (two) times daily. 04/28/18   Neva Seat, MD  aspirin 81 MG EC tablet Take 1 tablet (81 mg total) by mouth daily. 01/27/16   Burgess Estelle, MD  azelastine (ASTELIN) 0.1 % nasal spray USE 1 SPRAY IN EACH NOSTRIL TWICE DAILY AS NEEDED FOR RHINITIS 05/25/18   Neva Seat, MD  bictegravir-emtricitabine-tenofovir AF (BIKTARVY) 50-200-25 MG TABS tablet Take 1 tablet by mouth daily. 12/21/18   Michel Bickers, MD  Blood Glucose Monitoring [facility] [employer] [facility] VERIO) w/Device KIT 1 Device by Does not apply route 2 (two) times daily. ICD 10  E11.8, non-insulin dependent, test up to twice daily 09/03/18   Kathie Dike, MD  Budesonide 90 MCG/ACT inhaler Inhale 2 puffs into the lungs 2 (two) times daily. 09/03/18   Kathie Dike, MD  calcium gluconate 500 MG tablet Take 1 tablet (500 mg total) by mouth daily. 04/12/18   [name], MD  cholecalciferol (VITAMIN D) 1000 units tablet Take 1 tablet (1,000 Units total) by mouth daily. 01/27/16   Burgess Estelle, MD  ciclopirox (LOPROX) 0.77 % cream Apply topically 2 (two) times daily. Apply to both feet and between toes bid x 4 weeks. 01/03/19   Marzetta Board, DPM  citalopram (CELEXA) 20 MG tablet Take 20 mg by mouth daily. 10/25/14   [provider]  diclofenac sodium (VOLTAREN) 1 % GEL APPLY 4 GRAMS EXTERNALLY TO THE AFFECTED AREA FOUR TIMES DAILY AS NEEDED FOR PAIN 10/21/18   Neva Seat, MD  FLOVENT [facility]  220 MCG/ACT inhaler USE 1 PUFF PO BID 09/06/18   [provider]  gabapentin (NEURONTIN) 300 MG capsule TAKE 2 CAPSULES(600 MG) BY MOUTH TWICE DAILY 07/28/18   Neva Seat, MD  glucose blood [facility] VERIO) test strip Use as instructed ICD 10  E11.8, non-insulin dependent, test up to twice daily 09/03/18   Kathie Dike, MD  guaiFENesin (MUCINEX) 600 MG 12 hr tablet Take 2 tablets (1,200 mg total) by mouth 2 (two) times daily. 09/03/18   Kathie Dike, MD  hydroxypropyl methylcellulose / hypromellose (ISOPTO TEARS / GONIOVISC) 2.5 % ophthalmic solution Place 1 drop into both eyes as needed for dry eyes.    [provider]  hydrOXYzine (VISTARIL) 50 MG capsule Take 50 mg by mouth at bedtime.  01/21/18   [provider]  lisinopril (ZESTRIL) 2.5 MG tablet TAKE 1 TABLET(2.5 MG) BY MOUTH DAILY 11/14/18   Aldine Contes, MD  metFORMIN (GLUCOPHAGE) 500 MG tablet TAKE 1 TABLET BY  MOUTH EVERY DAY WITH BREAKFAST 01/09/19   Neva Seat, MD  nicotine (NICODERM CQ - DOSED IN MG/24 HOURS) 21 mg/24hr patch Place 1 patch (21 mg total) onto the skin daily. 09/04/18   Kathie Dike, MD  [facility] DELICA LANCETS FINE MISC 1 Stick by Does not apply route 2 (two) times daily. ICD 10  E11.8, non-insulin dependent, test up to twice daily 09/03/18   Kathie Dike, MD  pravastatin (PRAVACHOL) 40 MG tablet TAKE 1 TABLET(40 MG) BY MOUTH DAILY 06/30/18   Neva Seat, MD  predniSONE (DELTASONE) 5 MG tablet TAKE 1 TABLET(5 MG) BY MOUTH DAILY WITH BREAKFAST 01/08/19   [name], MD  QUEtiapine (SEROQUEL) 300 MG tablet Take 300 mg by mouth 2 (two) times daily.  11/11/15   [provider]  sucralfate (CARAFATE) 1 g tablet Take 1 tablet (1 g total) by mouth 4 (four) times daily. 04/18/18   Kyung Rudd, MD  tamsulosin (FLOMAX) 0.4 MG CAPS capsule TAKE 1 CAPSULE(0.4 MG) BY MOUTH DAILY AFTER BREAKFAST 10/21/18   Neva Seat, MD  Tiotropium Bromide-Olodaterol (STIOLTO RESPIMAT)  2.5-2.5 MCG/ACT AERS Inhale 2 puffs into the lungs daily. 09/03/18   Kathie Dike, MD  valACYclovir (VALTREX) 500 MG tablet TAKE 1 TABLET BY MOUTH DAILY 08/29/18   Michel Bickers, MD    Family History Family History  Problem Relation Age of Onset  . Hypertension Mother   . Heart attack Mother   . Stroke Mother   . Hypertension Father   . Cancer Father     Social History Social History   Tobacco Use  . Smoking status: Current Every Day Smoker    Packs/day: 0.45    Types: Cigarettes    Last attempt to quit: 11/18/2014    Years since quitting: 4.1  . Smokeless tobacco: Never Used  . Tobacco comment: .5 PPD  Substance Use Topics  . Alcohol use: No    Alcohol/week: 0.0 standard drinks  . Drug use: Yes    Frequency: 3.0 times per week    Types: "Crack" cocaine, Cocaine    Comment: last times saturday     Allergies   Statins   Review of Systems Review of Systems  Unable to perform ROS: Acuity of condition     Physical Exam Updated Vital Signs BP 125/84   Pulse (!) 145   Resp (!) 43   Ht 5' 9"  (1.753 m)   Wt 100.5 kg   SpO2 97%   BMI 32.72 kg/m   Physical Exam Vitals signs and nursing note reviewed.  Constitutional:      General: He is not in acute distress.    Appearance: Normal appearance. He is well-developed.  HENT:     Head: Normocephalic and atraumatic.     Comments: Tongue is massively swollen, protruding from his mouth    Right Ear: Hearing normal.     Left Ear: Hearing normal.     Nose: Nose normal.  Eyes:     Conjunctiva/sclera: Conjunctivae normal.     Pupils: Pupils are equal, round, and reactive to light.  Neck:     Musculoskeletal: Normal range of motion and neck supple.  Cardiovascular:     Rate and Rhythm: Regular rhythm.     Heart sounds: S1 normal and S2 normal. No murmur. No friction rub. No gallop.   Pulmonary:     Effort: Pulmonary effort is normal. No respiratory distress.     Breath sounds: Normal breath sounds.  Chest:  Chest wall: No tenderness.  Abdominal:     General: Bowel sounds are normal.     Palpations: Abdomen is soft.     Tenderness: There is no abdominal tenderness. There is no guarding or rebound. Negative signs include Murphy's sign and McBurney's sign.     Hernia: No hernia is present.  Musculoskeletal: Normal range of motion.  Skin:    General: Skin is warm and dry.     Findings: No rash.  Neurological:     Mental Status: He is alert and oriented to person, place, and time.     GCS: GCS eye subscore is 4. GCS verbal subscore is 5. GCS motor subscore is 6.     Cranial Nerves: No cranial nerve deficit.     Sensory: No sensory deficit.     Coordination: Coordination normal.  Psychiatric:        Speech: Speech normal.        Behavior: Behavior normal.        Thought Content: Thought content normal.      ED Treatments / Results  Labs (all labs ordered are listed, but only abnormal results are displayed) Labs Reviewed  CBC WITH DIFFERENTIAL/PLATELET  BASIC METABOLIC PANEL  PREPARE FRESH FROZEN PLASMA    EKG None  Radiology Dg Chest 1v Repeat Same Day  Result Date: 01/10/2019 CLINICAL DATA:  Exchange of endotracheal tube. EXAM: CHEST - 1 VIEW SAME DAY COMPARISON:  Radiograph earlier day at 0041 hour FINDINGS: Endotracheal tube tip 5.3 cm from the carina. Lower lung volumes from prior exam. Mild cardiomegaly is unchanged. Unchanged vascular congestion. Streaky bibasilar atelectasis. No pneumothorax. IMPRESSION: 1. Endotracheal tube tip 5.3 cm from the carina. 2. Lower lung volumes with grossly unchanged cardiomegaly and vascular congestion. Electronically Signed   By: Keith Rake M.D.   On: 01/10/2019 01:14   Dg Chest Portable 1 View  Result Date: 01/10/2019 CLINICAL DATA:  Angioedema. Tube placement. EXAM: PORTABLE CHEST 1 VIEW COMPARISON:  08/31/2018 FINDINGS: Endotracheal tube tip at the thoracic inlet 8.3 cm from the carina. Mild cardiomegaly with unchanged mediastinal  contours. Previous right lung base opacities have resolved with mild residual atelectasis or scarring. Linear left lung base opacities likely atelectasis. Central vascular congestion. No pneumothorax or large pleural effusion. IMPRESSION: 1. Endotracheal tube tip at the thoracic inlet 8.3 cm from the carina. 2. Mild cardiomegaly and vascular congestion. Bibasilar atelectasis. Electronically Signed   By: Keith Rake M.D.   On: 01/10/2019 01:13    Procedures Procedure Name: Intubation Date/Time: 01/10/2019 1:20 AM Performed by: Orpah Greek, MD Pre-anesthesia Checklist: Patient identified, Emergency Drugs available, Suction available, Patient being monitored and Timeout performed Oxygen Delivery Method: Ambu bag Preoxygenation: Pre-oxygenation with 100% oxygen Ventilation: Mask ventilation with difficulty Laryngoscope Size: Glidescope and 4 Grade View: Grade IV Number of attempts: 1 Comments: Unable to visualize airway anatomy to pass endotracheal tube.  Glide scope intubation was therefore aborted and nasal intubation performed.  6.0 ET tube passed through right nare into trachea.  Intubating fiberoptic scope advanced through tube and trachea and bronchus identified indicating successful intubation.  6.0 tube exchanged for 7.0 tube with a bougie.      (including critical care time)  Medications Ordered in ED Medications  ketamine (KETALAR) 10 MG/ML injection (has no administration in time range)  ketamine (KETALAR) 50 MG/ML injection (has no administration in time range)  ketamine (KETALAR) injection (100 mg Intravenous Given 01/10/19 0046)  succinylcholine (ANECTINE) injection (100 mg Intravenous Given 01/10/19 0045)  propofol (DIPRIVAN) 1000 MG/100ML infusion (50 mcg/kg/min  100.5 kg Intravenous New Bag/Given 01/10/19 0120)  famotidine (PEPCID) IVPB 20 mg premix (has no administration in time range)  vecuronium (NORCURON) injection 10 mg (has no administration in time range)   ketamine (KETALAR) injection (100 mg Intravenous Given 01/10/19 0020)  ketamine (KETALAR) 10 MG/ML injection (has no administration in time range)     Initial Impression / Assessment and Plan / ED Course  I have reviewed the triage vital signs and the nursing notes.  Pertinent labs & imaging results that were available during my care of the patient were reviewed by me and considered in my medical decision making (see chart for details).        Patient brought to the ER by EMS from home after noticing progressively swelling tongue.  Symptoms present for approximately 1 hour at arrival.  Patient noted to have massive swelling of his tongue to the point where he was unable to open his mouth at all past the tongue and tongue was protruding several inches from his mouth.  Decision to intubate was made immediately on arrival.  Patient had been given 0.87m epinephrine as well as 1270mSolu-Medrol and 505menadryl by EMS.  Intubation was difficult.  Difficulty was anticipated.  For this reason, patient was not paralyzed.  He was administered ketamine IV for sedation prior to procedure.  He was able to continue breathing on his own throughout intubation.  I was unable to visualize any of the structures with glide scope.  Tongue was massively swollen as well as the posterior oropharynx, upper airway, epiglottis and vocal cord folds.  Oral pharyngeal intubation was therefore halted and a 6.0 endotracheal tube was passed through the right nare and advanced.  When the patient took a breath, tube was advanced and breath sounds were heard coming through the open end of the tube.  Easy Color device was placed on and the patient was bagged.  Breath sounds were equal bilaterally, color change was positive.  I then advanced intubating bronchoscope through the tube and identified branching airways off the bronchus, indicating that tube was appropriately placed.  X-ray, however, showed that the tip of the tube was  right about at the clavicle, felt to be too shallow.  This was with the hub of the tube at the nare.  A bougie was therefore advanced through the tube and the 6.0 tube was exchanged for a 7.0.  Repeat x-ray was satisfactory.  Patient will be administered FFP for his angioedema.  Discussed with Dr. YapJames Ivanoffn-call for critical care.  [facility] Performed by: ChrOrpah GreekTotal critical care time: 40 minutes  Critical care time was exclusive of separately billable procedures and treating other patients.  Critical care was necessary to treat or prevent imminent or life-threatening deterioration.  Critical care was time spent personally by me on the following activities: development of treatment plan with patient and/or surrogate as well as nursing, discussions with consultants, evaluation of patient's response to treatment, examination of patient, obtaining history from patient or surrogate, ordering and performing treatments and interventions, ordering and review of laboratory studies, ordering and review of radiographic studies, pulse oximetry and re-evaluation of patient's condition.\ Final Clinical Impressions(s) / ED Diagnoses   Final diagnoses:  Angioedema, initial encounter    ED Discharge Orders    None       Shantina Chronister, ChrGwenyth AllegraD 01/10/19 [id number]

## 2019-01-10 NOTE — Progress Notes (Signed)
[facility] Progress Note Patient Name: Maxximus Gotay DOB: 05/01/1956 MRN: [id number]   Date of Service  01/10/2019  HPI/Events of Note  62/M transferred from [facility] for respiratory failure secondary to angioedema.  Pt was given epinephrine, benadryl and solu-medrol by EMS.  PT was nasally intubated in [facility] as his tongue was protruding from his mouth.   ABG 7.311/41.7/84 on current vent settings.   eICU Interventions  Continue with IV steroids solu-medrol 40 q8rs, famotidine, schedule benadryl every 6 hours.  Give FFP as ordered.  Start on heparin for DVT prophylaxis.      Intervention Category Evaluation Type: New Patient Evaluation  Elsie Lincoln 01/10/2019, 6:40 AM

## 2019-01-10 NOTE — ED Notes (Signed)
Unopened bottle of propofol sent with Vivien Rota RN and [facility] for transport .

## 2019-01-10 NOTE — Progress Notes (Signed)
ANTICOAGULATION CONSULT NOTE - Initial Consult  Pharmacy Consult for Heparin (Apixaban on hold) Indication: History of DVT/PE  Allergies  Allergen Reactions  . Statins Other (See Comments)    Elevated liver enzymes.   Patient Measurements: Height: 5\' 9"  (175.3 cm) Weight: 221 lb 9 oz (100.5 kg) IBW/kg (Calculated) : 70.7  Vital Signs: BP: 138/89 (06/23 0600) Pulse Rate: 100 (06/23 0600)  Labs: Recent Labs    01/10/19 0101 01/10/19 0639  HGB 10.6* 11.6*  HCT 34.2* 34.0*  PLT 217  --   CREATININE 1.45*  --     Estimated Creatinine Clearance: 62.5 mL/min (A) (by C-G formula based on SCr of 1.45 mg/dL (H)).   Medical History: Past Medical History:  Diagnosis Date  . AKI (acute kidney injury) ([place]) 09/24/2016  . Calcium oxalate crystals in urine 12/23/2011   Asymptomatic, no hematuria. Advised to take plenty of water.  [name] Kitchen COPD (chronic obstructive pulmonary disease) ([place])   . Depression   . Diabetes mellitus without complication ([place]) 3300   pre diabetic  . Hepatitis C   . HIV (human immunodeficiency virus infection) ([place])   . Hypertension   . PE (pulmonary thromboembolism) ([place]) 05/12/2016   PE in 2017, on chronic anticoagulation for this and recurrent DVT.  [name] Kitchen Peripheral arterial disease ([place])    ooccluded left SFA by duplex ultrasound, tibial disease left greater than right  . Prostate cancer metastatic to multiple sites [facility])   . Stroke ([place]) 11/2011   Carotids Doppler negative. Right sided weakness resolved, initially on Plavix for 3 months and then continued with ASA.    . TB lung, latent 1988   Treated    Assessment: 63 y/o M transfer from [facility] with angioedema requiring intubation. Pt on apixaban PTA for hx DVT/PE. Holding apixaban and starting heparin. Baseline aPTT/HL in process. Anticipate using aPTT to dose for now given apixaban influence on heparin levels.   Goal of Therapy:  Heparin level 0.3-0.7 units/ml aPTT 66-102 seconds Monitor platelets by  anticoagulation protocol: Yes   Plan:  Start heparin drip at 1400 units/hr 1600 HL/aPTT Daily CBC/HL/aPTT Monitor for bleeding  Narda Bonds, PharmD, BCPS Clinical Pharmacist Phone: [phone]

## 2019-01-10 NOTE — Progress Notes (Signed)
Initial Nutrition Assessment  DOCUMENTATION CODES:   Not applicable  INTERVENTION:   Cortrak service offered Friday from 8 am-4pm.   Once access is obtained:  -Vital High Protein @ 30 ml/hr (720 ml) -60 ml Prostat TID  Provides: 1320 kcals (2117 kcal with propofol), 153 grams protein, 602 ml free water. Kcals exceed requirement to meet protein needs.   NUTRITION DIAGNOSIS:   Increased nutrient needs related to cancer and cancer related treatments as evidenced by estimated needs.  GOAL:   Provide needs based on ASPEN/SCCM guidelines  MONITOR:   Diet advancement, Vent status, Labs, I & O's, Skin, TF tolerance, Weight trends  REASON FOR ASSESSMENT:   Ventilator    ASSESSMENT:   Patient with PMH significant for COPD, depression, DM, Hepatitis C, HIV, HTN, PAD, CVA, and prostate cancer metastatic to multiple sites. Presents this admission with angioedema likely secondary to ACE-I.   Spoke with RN at bedside. Pt nasally intubated. Multiple attempts were made to pass OGT but no success. Pt started bleeding worse with each attempt. Discussed possible tube placement options if pt is unable to extubate within 24-48 hrs. Cortrak is offered Friday from 8am to 4 pm. RD to leave recommendations.   Per chart records, pt's weight has fluctuated from 95-101 kg. Will use 100.5 kg to estimate needs (suspect this is stated).   Patient is currently intubated on ventilator support MV: 8.6 L/min Temp (24hrs), Avg:97.4 F (36.3 C), Min:97 F (36.1 C), Max:97.5 F (36.4 C)  Propofol: 30.2 ml/hr- provides 797 kcal daily   I/O: +423 ml since admit UOP: none documented   Drips: NS @ 75 ml/hr, propofol Medications: SS novolog, solu-medrol Labs: CBG 192-235  NUTRITION - FOCUSED PHYSICAL EXAM:    Most Recent Value  Orbital Region  No depletion  Upper Arm Region  No depletion  Thoracic and Lumbar Region  Unable to assess  Buccal Region  Unable to assess  Temple Region  No depletion   Clavicle Bone Region  No depletion  Clavicle and Acromion Bone Region  No depletion  Scapular Bone Region  Unable to assess  Dorsal Hand  Unable to assess [mits]  Patellar Region  Moderate depletion  Anterior Thigh Region  Moderate depletion  Posterior Calf Region  Moderate depletion  Edema (RD Assessment)  Mild  Hair  Unable to assess  Eyes  Unable to assess  Mouth  Unable to assess  Skin  Unable to assess  Nails  Unable to assess     Diet Order:   Diet Order            Diet NPO time specified  Diet effective now              EDUCATION NEEDS:   Not appropriate for education at this time  Skin:  Skin Assessment: Reviewed RN Assessment  Last BM:  PTA  Height:   Ht Readings from Last 1 Encounters:  01/10/19 5\' 9"  (1.753 m)    Weight:   Wt Readings from Last 1 Encounters:  01/10/19 100.5 kg    Ideal Body Weight:  72.7 kg  BMI:  Body mass index is 32.72 kg/m.  Estimated Nutritional Needs:   Kcal:  1829 kcal  Protein:  145-182 grams  Fluid:  >/= 1.4 L/day   Mariana Single RD, LDN Clinical Nutrition Pager # - [phone]

## 2019-01-10 NOTE — Sedation Documentation (Signed)
Pt being bagged at this time . vss

## 2019-01-10 NOTE — Progress Notes (Signed)
PCCM interval progress note  Patient admitted today AM for tongue swelling, angioedema-likely secondary to ACE inhibitor Remains nasally intubated ABG is adequate, hemodynamically stable  Continues to have significant tongue swelling Continue IV steroids, famotidine and Benadryl Getting transfused FFP Keep sedated and intubated for now.  Marshell Garfinkel MD [facility] Pager [phone] If no answer call [phone] 01/10/2019, 3:54 PM

## 2019-01-10 NOTE — Progress Notes (Signed)
[place] for Heparin (Apixaban on hold) Indication: History of DVT/PE  Allergies  Allergen Reactions  . Lisinopril Swelling  . Statins Other (See Comments)    Elevated liver enzymes.   Patient Measurements: Height: 5\' 9"  (175.3 cm) Weight: 221 lb 9 oz (100.5 kg) IBW/kg (Calculated) : 70.7  Heparin dosing wt: 92 kg  Vital Signs: Temp: 96.4 F (35.8 C) (06/23 1515) Temp Source: Axillary (06/23 1515) BP: 120/84 (06/23 1600) Pulse Rate: 63 (06/23 1600)  Labs: Recent Labs    01/10/19 0101 01/10/19 0639 01/10/19 0644 01/10/19 1627  HGB 10.6* 11.6*  --   --   HCT 34.2* 34.0*  --   --   PLT 217  --   --   --   APTT  --   --  25 60*  LABPROT  --   --  14.2  --   INR  --   --  1.1  --   HEPARINUNFRC  --   --  0.69 0.84*  CREATININE 1.45*  --   --   --     Estimated Creatinine Clearance: 62.5 mL/min (A) (by C-G formula based on SCr of 1.45 mg/dL (H)).   Medical History: Past Medical History:  Diagnosis Date  . AKI (acute kidney injury) ([place]) 09/24/2016  . Calcium oxalate crystals in urine 12/23/2011   Asymptomatic, no hematuria. Advised to take plenty of water.  [name] Kitchen COPD (chronic obstructive pulmonary disease) ([place])   . Depression   . Diabetes mellitus without complication ([place]) 8242   pre diabetic  . Hepatitis C   . HIV (human immunodeficiency virus infection) ([place])   . Hypertension   . PE (pulmonary thromboembolism) ([place]) 05/12/2016   PE in 2017, on chronic anticoagulation for this and recurrent DVT.  [name] Kitchen Peripheral arterial disease ([place])    ooccluded left SFA by duplex ultrasound, tibial disease left greater than right  . Prostate cancer metastatic to multiple sites [facility])   . Stroke ([place]) 11/2011   Carotids Doppler negative. Right sided weakness resolved, initially on Plavix for 3 months and then continued with ASA.    . TB lung, latent 1988   Treated    Assessment: 63 yr old male transfer from [facility] with angioedema requiring  intubation. Pt on apixaban PTA for hx DVT/PE (last dose of apixaban on 01/09/19). Holding apixaban and started heparin infusion earlier today. Baseline aPTT/HL: 25 seconds/0.69 units/ml. Using aPTT to dose heparin for now given apixaban influence on heparin levels. Coagulation labs drawn at 16:27 today: aPTT 60 sec, heparin level 0.84 units/ml. Per RN, no signs/symptoms of bleeding or issues with heparin drip.  Goal of Therapy:  Heparin level 0.3-0.7 units/ml aPTT 66-102 seconds Monitor platelets by anticoagulation protocol: Yes   Plan:  Continue to dose heparin based on aPTT for now, due to influence of apixaban on heparin levels Increase heparin drip to 1580 units/hr 0000 HL/aPTT Daily CBC/HL/aPTT Monitor for signs/symptoms of bleeding  Gillermina Hu, PharmD, BCPS, [facility] Clinical Pharmacist

## 2019-01-11 ENCOUNTER — Inpatient Hospital Stay (HOSPITAL_COMMUNITY): Payer: 59

## 2019-01-11 LAB — BPAM FFP
Blood Product Expiration Date: 202006282359
Blood Product Expiration Date: 202006282359
ISSUE DATE / TIME: 202006230941
ISSUE DATE / TIME: 202006231229
Unit Type and Rh: 7300
Unit Type and Rh: 7300

## 2019-01-11 LAB — TRIGLYCERIDES: Triglycerides: 118 mg/dL (ref <150)

## 2019-01-11 LAB — BASIC METABOLIC PANEL WITH GFR
Anion gap: 9 (ref 5–15)
BUN: 16 mg/dL (ref 8–23)
CO2: 21 mmol/L — ABNORMAL LOW (ref 22–32)
Calcium: 9.1 mg/dL (ref 8.9–10.3)
Chloride: 109 mmol/L (ref 98–111)
Creatinine, Ser: 1.11 mg/dL (ref 0.61–1.24)
GFR calc Af Amer: >60 mL/min (ref >60)
GFR calc non Af Amer: >60 mL/min (ref >60)
Glucose, Bld: 140 mg/dL — ABNORMAL HIGH (ref 70–99)
Potassium: 4.2 mmol/L (ref 3.5–5.1)
Sodium: 139 mmol/L (ref 135–145)

## 2019-01-11 LAB — PREPARE FRESH FROZEN PLASMA
Unit division: 0
Unit division: 0

## 2019-01-11 LAB — GLUCOSE, CAPILLARY
Glucose-Capillary: 102 mg/dL — ABNORMAL HIGH (ref 70–99)
Glucose-Capillary: 115 mg/dL — ABNORMAL HIGH (ref 70–99)
Glucose-Capillary: 131 mg/dL — ABNORMAL HIGH (ref 70–99)
Glucose-Capillary: 132 mg/dL — ABNORMAL HIGH (ref 70–99)
Glucose-Capillary: 135 mg/dL — ABNORMAL HIGH (ref 70–99)
Glucose-Capillary: 140 mg/dL — ABNORMAL HIGH (ref 70–99)

## 2019-01-11 LAB — APTT: aPTT: 74 s — ABNORMAL HIGH (ref 24–36)

## 2019-01-11 LAB — PHOSPHORUS: Phosphorus: 4.3 mg/dL (ref 2.5–4.6)

## 2019-01-11 LAB — C4 COMPLEMENT: Complement C4, Body Fluid: 28 mg/dL (ref 14–44)

## 2019-01-11 LAB — C3 COMPLEMENT: C3 Complement: 148 mg/dL (ref 82–167)

## 2019-01-11 LAB — MAGNESIUM: Magnesium: 2 mg/dL (ref 1.7–2.4)

## 2019-01-11 LAB — HEPARIN LEVEL (UNFRACTIONATED): Heparin Unfractionated: 0.98 IU/mL — ABNORMAL HIGH (ref 0.30–0.70)

## 2019-01-11 MED ORDER — BISACODYL 10 MG RE SUPP
10.0000 mg | Freq: Every day | RECTAL | Status: DC | PRN
  Administered 2019-01-12 – 2019-01-13 (×2): 10 mg via RECTAL
  Filled 2019-01-11 (×2): qty 1

## 2019-01-11 NOTE — Progress Notes (Signed)
[place] for Heparin (Apixaban on hold) Indication: History of DVT/PE  Allergies  Allergen Reactions  . Lisinopril Swelling  . Statins Other (See Comments)    Elevated liver enzymes.   Patient Measurements: Height: 5\' 9"  (175.3 cm) Weight: 221 lb 9 oz (100.5 kg) IBW/kg (Calculated) : 70.7  Vital Signs: Temp: 97.5 F (36.4 C) (06/23 2339) Temp Source: Oral (06/23 2339) BP: 126/85 (06/23 2327) Pulse Rate: 63 (06/23 2327)  Labs: Recent Labs    01/10/19 0101 01/10/19 5035 01/10/19 0644 01/10/19 1627 01/10/19 2323  HGB 10.6* 11.6*  --   --  9.8*  HCT 34.2* 34.0*  --   --  30.6*  PLT 217  --   --   --  208  APTT  --   --  25 60* 79*  LABPROT  --   --  14.2  --   --   INR  --   --  1.1  --   --   HEPARINUNFRC  --   --  0.69 0.84* 1.03*  CREATININE 1.45*  --   --   --   --     Estimated Creatinine Clearance: 62.5 mL/min (A) (by C-G formula based on SCr of 1.45 mg/dL (H)).   Medical History: Past Medical History:  Diagnosis Date  . AKI (acute kidney injury) ([place]) 09/24/2016  . Calcium oxalate crystals in urine 12/23/2011   Asymptomatic, no hematuria. Advised to take plenty of water.  [name] Kitchen COPD (chronic obstructive pulmonary disease) ([place])   . Depression   . Diabetes mellitus without complication ([place]) 4656   pre diabetic  . Hepatitis C   . HIV (human immunodeficiency virus infection) ([place])   . Hypertension   . PE (pulmonary thromboembolism) ([place]) 05/12/2016   PE in 2017, on chronic anticoagulation for this and recurrent DVT.  [name] Kitchen Peripheral arterial disease ([place])    ooccluded left SFA by duplex ultrasound, tibial disease left greater than right  . Prostate cancer metastatic to multiple sites [facility])   . Stroke ([place]) 11/2011   Carotids Doppler negative. Right sided weakness resolved, initially on Plavix for 3 months and then continued with ASA.    . TB lung, latent 1988   Treated    Assessment: 63 y/o M transfer from [facility] with  angioedema requiring intubation. Pt on apixaban PTA for hx DVT/PE. Holding apixaban and starting heparin. Baseline aPTT/HL in process. Anticipate using aPTT to dose for now given apixaban influence on heparin levels.   6/24 AM update:  APTT therapeutic x 1 after rate increase  Goal of Therapy:  Heparin level 0.3-0.7 units/ml aPTT 66-102 seconds Monitor platelets by anticoagulation protocol: Yes   Plan:  Cont heparin at 1600 units/hr Confirmatory aPTT with AM labs Daily CBC/HL/aPTT Monitor for bleeding  Narda Bonds, PharmD, BCPS Clinical Pharmacist Phone: [phone]

## 2019-01-11 NOTE — Progress Notes (Signed)
[facility] Progress Note Patient Name: Jacob Joseph DOB: 1955-11-12 MRN: [id number]   Date of Service  01/11/2019  HPI/Events of Note  Pt needs restraint orders renewed  eICU Interventions  Orders renewed        Frederik Pear 01/11/2019, 5:05 AM

## 2019-01-11 NOTE — Progress Notes (Signed)
[place] for Heparin (Apixaban on hold) Indication: History of DVT/PE  Allergies  Allergen Reactions  . Lisinopril Swelling  . Statins Other (See Comments)    Elevated liver enzymes.   Patient Measurements: Height: 5\' 9"  (175.3 cm) Weight: 209 lb 14.1 oz (95.2 kg) IBW/kg (Calculated) : 70.7  Vital Signs: Temp: 97.5 F (36.4 C) (06/24 0700) Temp Source: Oral (06/24 0700) BP: 122/81 (06/24 0600) Pulse Rate: 62 (06/24 0600)  Labs: Recent Labs    01/10/19 0101 01/10/19 2952  01/10/19 0644 01/10/19 1627 01/10/19 2323 01/11/19 0332  HGB 10.6* 11.6*  --   --   --  9.8*  --   HCT 34.2* 34.0*  --   --   --  30.6*  --   PLT 217  --   --   --   --  208  --   APTT  --   --    < > 25 60* 79* 74*  LABPROT  --   --   --  14.2  --   --   --   INR  --   --   --  1.1  --   --   --   HEPARINUNFRC  --   --    < > 0.69 0.84* 1.03* 0.98*  CREATININE 1.45*  --   --   --   --  1.11  --    < > = values in this interval not displayed.    Estimated Creatinine Clearance: 79.6 mL/min (by C-G formula based on SCr of 1.11 mg/dL).   Medical History: Past Medical History:  Diagnosis Date  . AKI (acute kidney injury) ([place]) 09/24/2016  . Calcium oxalate crystals in urine 12/23/2011   Asymptomatic, no hematuria. Advised to take plenty of water.  [name] Kitchen COPD (chronic obstructive pulmonary disease) ([place])   . Depression   . Diabetes mellitus without complication ([place]) 8413   pre diabetic  . Hepatitis C   . HIV (human immunodeficiency virus infection) ([place])   . Hypertension   . PE (pulmonary thromboembolism) ([place]) 05/12/2016   PE in 2017, on chronic anticoagulation for this and recurrent DVT.  [name] Kitchen Peripheral arterial disease ([place])    ooccluded left SFA by duplex ultrasound, tibial disease left greater than right  . Prostate cancer metastatic to multiple sites [facility])   . Stroke ([place]) 11/2011   Carotids Doppler negative. Right sided weakness resolved, initially on  Plavix for 3 months and then continued with ASA.    . TB lung, latent 1988   Treated    Assessment: 63 year-old male transfer from [facility] with angioedema requiring intubation. Pt on apixaban PTA for history of DVT/PE. Held apixaban and started heparin. Labs from 0332 today: Confirmatory aPTT level of 74 therapeutic this AM. APTT does not correlate with heparin level of 0.98 this AM. Anticipate using aPTT to dose for now given apixaban influence on heparin levels. Per RN, no signs/symptoms of bleeding or issues with heparin drip.   Goal of Therapy:  Heparin level 0.3-0.7 units/ml aPTT 66-102 seconds Monitor platelets by anticoagulation protocol: Yes   Plan:  Continue heparin at 1600 units/hr Monitor for heparin level correlation with aPTT. Daily CBC/HL/aPTT (d/c aPTT after correlation with heparin level achieved) Monitor for bleeding

## 2019-01-11 NOTE — Progress Notes (Addendum)
NAME:  Jacob Joseph, MRN:  [id number], DOB:  11-08-1955, LOS: 1 ADMISSION DATE:  01/10/2019, CONSULTATION DATE: 6/23 REFERRING MD: Dr. Betsey Holiday EDP, CHIEF COMPLAINT: Angioedema  Brief History   64 year old male presented to the emergency department with tongue swelling shortly after starting on lisinopril.  Nasally intubated in the emergency department transferred to [facility].  History of present illness   Patient is encephalopathic and/or intubated. Therefore history has been obtained from chart review. 63 year old with past medical history as below which is significant for HIV, metastatic prostate cancer, COPD, diabetes mellitus, hepatitis C, PE on anticoagulation, and hypertension.  Prostate cancer is metastatic to multiple sites including the thoracic spine.  He is currently managed on palliative Zytiga and is status post radiation therapy to the spine.  He has been on anticoagulation since 2017 after a second unprovoked PE.  He presented to [facility] emergency department on 6/22 with chief complaint of tongue swelling.  He took lisinopril about 1 hour prior which is apparently new medication for him.  Tongue was significantly swollen upon EMS arrival and he was given Benadryl, Solu-Medrol, and epinephrine in route to the hospital.  Upon arrival to the emergency department his tongue was protruding and he cannot control his secretions.  Endotracheal intubation was attempted however airway was very difficult.  He was nasally intubated by the emergency room physician.  He was then transferred to [facility] for ICU admission.  Past Medical History   has a past medical history of AKI (acute kidney injury) ([place]) (09/24/2016), Calcium oxalate crystals in urine (12/23/2011), COPD (chronic obstructive pulmonary disease) ([place]), Depression, Diabetes mellitus without complication ([place]) (6568), Hepatitis C, HIV (human immunodeficiency virus infection) ([place]), Hypertension, PE (pulmonary  thromboembolism) ([place]) (05/12/2016), Peripheral arterial disease ([place]), Prostate cancer metastatic to multiple sites [facility]), Stroke ([place]) (11/2011), and TB lung, latent (1988).   Significant Hospital Events   6/23 admitted. Received 2 FFP and started on benadryl, pepcid, solumedrol for angioedema  6/24 remains nasally intubated, remains hemodynamically stable and sedated   Consults:    Procedures:  NTT (right nare) 6/23>  Significant Diagnostic Tests:  CXR 6/24>   Micro Data:  6/23 SARS CoV2 negative   Antimicrobials:     Interim history/subjective:  NAEO Remains intubated, sedated and HDS   Objective   Blood pressure 122/81, pulse 62, temperature (!) 97.5 F (36.4 C), temperature source Oral, resp. rate 16, height 5\' 9"  (1.753 m), weight 95.2 kg, SpO2 99 %.    Vent Mode: PRVC FiO2 (%):  [40 %-60 %] 40 % Set Rate:  [16 bmp] 16 bmp Vt Set:  [560 mL] 560 mL PEEP:  [5 cmH20] 5 cmH20 Plateau Pressure:  [15 cmH20-16 cmH20] 15 cmH20   Intake/Output Summary (Last 24 hours) at 01/11/2019 1275 Last data filed at 01/11/2019 0600 Gross per 24 hour  Intake 2710.06 ml  Output 1125 ml  Net 1585.06 ml   Filed Weights   01/10/19 0008 01/11/19 0500  Weight: 100.5 kg 95.2 kg    Examination: General: obese adult male, nasally intubated and mechanically ventilated, sedated, NAD  HENT: Edematous upper eyelids bilaterally. Edematous protuberant tongue. R nare nasal intubation secure. Pink mmm. Trachea midline   Lungs: CTA. Symmetrical chest expansion. Synchronous with vent.  Cardiovascular: RRR s1s2 no rgm. Capillary refill < 3 seconds.  Abdomen: Protuberant, soft, hypoactive bowel sounds x 4  Extremities: symmetrical bulk and tone, no obvious joint deformity, no edema.  Neuro: RASS -3 on fentanyl and propofol. Pinpoint pupils.  Resolved Hospital Problem list     Assessment & Plan:   Acute Respiratory failure due to compromised airway in setting of angioedema, requiring  intubation Angioedema: likely secondary to ACE-I.  -s.p 2 FFP 6/23 -Lisinopril is discontinued  P - Continue full vent support  - Maintain RASS -3 to -4 due to airway difficulty, continue propofol and fentanyl  - C3 and C4 pending - Continue pepcid, benadryl, solumedrol - Do not resume lisinopril   COPD without acute exacerbation  - Duoneb, budesonide - SpO2 goal 88-92%  DM2 -home metformin, holding  P - Continue SSI   Leukocytosis  Likely in setting of steroids P -Continue to trend CBC as well as temperature   Constipation, at risk In setting of CNS depressing medications, immobility due to sedation P -PRN suppository   Hx PE 2017 unprovoked now on livelong AC - heparin gtt per pharmacy consult  - monitor coags - monitor for s/sx bleeding  Hx prostate cancer with metastases to thoracic spine  - Hold daily palliative Zytiga for now  HIV Hepatitis C - Hold home Biktarvy untial able to take PO - check CD4 count    Inadequate oral intake, at risk P -No enteral access at current, will not attempt placement due to angioedema  -Patient sedation includes propofol gtt, so patient is not at great risk for caloric deficit due to lipid content. -Continue to follow BMP and normalize electrolyte abnormalities as needed   Best practice:  Diet: NPO Pain/Anxiety/Delirium protocol (if indicated): propofol/fentanyl RASS goal -3 to -4 VAP protocol (if indicated): Yes DVT prophylaxis: heparin per pharmacy  GI prophylaxis: PPI Glucose control: SSI Mobility: BR Code Status: FULL Family Communication: 6/24 spouse updated over the phone  Disposition: ICU  Labs   CBC: Recent Labs  Lab 01/10/19 0101 01/10/19 0639 01/10/19 2323  WBC 15.4*  --  14.4*  NEUTROABS 14.0*  --   --   HGB 10.6* 11.6* 9.8*  HCT 34.2* 34.0* 30.6*  MCV 89.5  --  87.2  PLT 217  --  833    Basic Metabolic Panel: Recent Labs  Lab 01/10/19 0101 01/10/19 0639 01/10/19 2323  NA 140 138 139  K  3.8 4.4 4.2  CL 105  --  109  CO2 21*  --  21*  GLUCOSE 235*  --  140*  BUN 17  --  16  CREATININE 1.45*  --  1.11  CALCIUM 9.1  --  9.1  MG  --   --  2.0  PHOS  --   --  4.3   GFR: Estimated Creatinine Clearance: 79.6 mL/min (by C-G formula based on SCr of 1.11 mg/dL). Recent Labs  Lab 01/10/19 0101 01/10/19 2323  WBC 15.4* 14.4*    Liver Function Tests: No results for input(s): AST, ALT, ALKPHOS, BILITOT, PROT, ALBUMIN in the last 168 hours. No results for input(s): LIPASE, AMYLASE in the last 168 hours. No results for input(s): AMMONIA in the last 168 hours.  ABG    Component Value Date/Time   PHART 7.311 (L) 01/10/2019 0639   PCO2ART 41.7 01/10/2019 0639   PO2ART 84.0 01/10/2019 0639   HCO3 21.0 01/10/2019 0639   TCO2 22 01/10/2019 0639   ACIDBASEDEF 5.0 (H) 01/10/2019 0639   O2SAT 95.0 01/10/2019 0639     Coagulation Profile: Recent Labs  Lab 01/10/19 0644  INR 1.1    Cardiac Enzymes: No results for input(s): CKTOTAL, CKMB, CKMBINDEX, TROPONINI in the last 168 hours.  HbA1C: Hemoglobin A1C  Date/Time Value Ref Range Status  05/26/2018 01:57 PM 6.0 (A) 4.0 - 5.6 % Final  03/03/2018 02:02 PM 6.1 (A) 4.0 - 5.6 % Final   Hgb A1c MFr Bld  Date/Time Value Ref Range Status  01/10/2019 06:44 AM 7.0 (H) 4.8 - 5.6 % Final    Comment:    (NOTE) Pre diabetes:          5.7%-6.4% Diabetes:              >6.4% Glycemic control for   <7.0% adults with diabetes   03/08/2012 09:06 PM 5.9 (H) <5.7 % Final    Comment:    (NOTE)                                                                       According to the ADA Clinical Practice Recommendations for 2011, when HbA1c is used as a screening test:  >=6.5%   Diagnostic of Diabetes Mellitus           (if abnormal result is confirmed) 5.7-6.4%   Increased risk of developing Diabetes Mellitus References:Diagnosis and Classification of Diabetes Mellitus,Diabetes IYME,1583,09(MMHWK 1):S62-S69 and Standards of  Medical Care in         Diabetes - 2011,Diabetes Care,2011,34 (Suppl 1):S11-S61.    CBG: Recent Labs  Lab 01/10/19 1513 01/10/19 1948 01/10/19 2337 01/11/19 0357 01/11/19 0736  GLUCAP 163* 136* 141* 131* 132*     Critical care time: 40 minutes      Eliseo Gum MSN, AGACNP-BC [place] [phone] If no answer, [phone] 01/11/2019, 9:23 AM

## 2019-01-12 ENCOUNTER — Inpatient Hospital Stay (HOSPITAL_COMMUNITY): Payer: 59

## 2019-01-12 LAB — GLUCOSE, CAPILLARY
Glucose-Capillary: 137 mg/dL — ABNORMAL HIGH (ref 70–99)
Glucose-Capillary: 125 mg/dL — ABNORMAL HIGH (ref 70–99)
Glucose-Capillary: 134 mg/dL — ABNORMAL HIGH (ref 70–99)
Glucose-Capillary: 131 mg/dL — ABNORMAL HIGH (ref 70–99)
Glucose-Capillary: 147 mg/dL — ABNORMAL HIGH (ref 70–99)
Glucose-Capillary: 138 mg/dL — ABNORMAL HIGH (ref 70–99)

## 2019-01-12 LAB — CBC
HCT: 31.6 % — ABNORMAL LOW (ref 39.0–52.0)
Hemoglobin: 9.9 g/dL — ABNORMAL LOW (ref 13.0–17.0)
MCH: 27.7 pg (ref 26.0–34.0)
MCHC: 31.3 g/dL (ref 30.0–36.0)
MCV: 88.3 fL (ref 80.0–100.0)
Platelets: 225 10*3/uL (ref 150–400)
RBC: 3.58 MIL/uL — ABNORMAL LOW (ref 4.22–5.81)
RDW: 14.6 % (ref 11.5–15.5)
WBC: 12.2 10*3/uL — ABNORMAL HIGH (ref 4.0–10.5)
nRBC: 0 % (ref 0.0–0.2)

## 2019-01-12 LAB — APTT: aPTT: 80 seconds — ABNORMAL HIGH (ref 24–36)

## 2019-01-12 LAB — HEPARIN LEVEL (UNFRACTIONATED): Heparin Unfractionated: 0.93 IU/mL — ABNORMAL HIGH (ref 0.30–0.70)

## 2019-01-12 LAB — C4 COMPLEMENT: Complement C4, Body Fluid: 32 mg/dL (ref 14–44)

## 2019-01-12 LAB — BASIC METABOLIC PANEL
Anion gap: 11 (ref 5–15)
BUN: 17 mg/dL (ref 8–23)
CO2: 19 mmol/L — ABNORMAL LOW (ref 22–32)
Calcium: 8.7 mg/dL — ABNORMAL LOW (ref 8.9–10.3)
Chloride: 108 mmol/L (ref 98–111)
Creatinine, Ser: 1.14 mg/dL (ref 0.61–1.24)
GFR calc Af Amer: >60 mL/min (ref >60)
GFR calc non Af Amer: >60 mL/min (ref >60)
Glucose, Bld: 130 mg/dL — ABNORMAL HIGH (ref 70–99)
Potassium: 4 mmol/L (ref 3.5–5.1)
Sodium: 138 mmol/L (ref 135–145)

## 2019-01-12 LAB — C3 COMPLEMENT: C3 Complement: 160 mg/dL (ref 82–167)

## 2019-01-12 LAB — T-HELPER CELLS (CD4) COUNT (NOT AT ARMC)
CD4 % Helper T Cell: 20 % — ABNORMAL LOW (ref 33–65)
CD4 T Cell Abs: 122 /uL — ABNORMAL LOW (ref 400–1790)

## 2019-01-12 LAB — TRIGLYCERIDES: Triglycerides: 308 mg/dL — ABNORMAL HIGH (ref <150)

## 2019-01-12 MED ORDER — ALBUTEROL SULFATE (2.5 MG/3ML) 0.083% IN NEBU
2.5000 mg | INHALATION_SOLUTION | RESPIRATORY_TRACT | Status: DC
  Administered 2019-01-12 (×2): 2.5 mg via RESPIRATORY_TRACT
  Filled 2019-01-12 (×2): qty 3

## 2019-01-12 MED ORDER — ROCURONIUM BROMIDE 50 MG/5ML IV SOLN
50.0000 mg | Freq: Once | INTRAVENOUS | Status: AC
  Filled 2019-01-12: qty 5

## 2019-01-12 MED ORDER — MIDAZOLAM HCL 2 MG/2ML IJ SOLN
INTRAMUSCULAR | Status: AC
  Filled 2019-01-12: qty 2

## 2019-01-12 MED ORDER — HYDRALAZINE HCL 20 MG/ML IJ SOLN
10.0000 mg | INTRAMUSCULAR | Status: DC | PRN
  Administered 2019-01-13: 20 mg via INTRAVENOUS
  Administered 2019-01-13: 15 mg via INTRAVENOUS
  Administered 2019-01-13 – 2019-01-15 (×4): 20 mg via INTRAVENOUS
  Filled 2019-01-12 (×6): qty 1

## 2019-01-12 MED ORDER — BUDESONIDE 0.25 MG/2ML IN SUSP
0.2500 mg | Freq: Every day | RESPIRATORY_TRACT | Status: DC
  Administered 2019-01-13 – 2019-01-23 (×10): 0.25 mg via RESPIRATORY_TRACT
  Filled 2019-01-12 (×14): qty 2

## 2019-01-12 MED ORDER — IPRATROPIUM-ALBUTEROL 0.5-2.5 (3) MG/3ML IN SOLN
3.0000 mL | Freq: Four times a day (QID) | RESPIRATORY_TRACT | Status: DC
  Administered 2019-01-12 – 2019-01-16 (×14): 3 mL via RESPIRATORY_TRACT
  Filled 2019-01-12 (×16): qty 3

## 2019-01-12 MED ORDER — BUDESONIDE 0.25 MG/2ML IN SUSP: 0.2500 mg | Freq: Four times a day (QID) | RESPIRATORY_TRACT | Status: DC

## 2019-01-12 MED ORDER — MIDAZOLAM HCL 2 MG/2ML IJ SOLN
2.0000 mg | INTRAMUSCULAR | Status: DC | PRN
  Administered 2019-01-12: 2 mg via INTRAVENOUS

## 2019-01-12 MED ORDER — MIDAZOLAM HCL 2 MG/2ML IJ SOLN: 2.0000 mg | INTRAMUSCULAR | Status: DC | PRN

## 2019-01-12 MED ORDER — MIDAZOLAM HCL 2 MG/2ML IJ SOLN
2.0000 mg | Freq: Once | INTRAMUSCULAR | Status: AC
  Administered 2019-01-12: 2 mg via INTRAVENOUS
  Filled 2019-01-12: qty 2

## 2019-01-12 MED ORDER — ROCURONIUM BROMIDE 10 MG/ML (PF) SYRINGE
PREFILLED_SYRINGE | INTRAVENOUS | Status: AC
  Administered 2019-01-12: 06:00:00 50 mg
  Filled 2019-01-12: qty 10

## 2019-01-12 MED ORDER — IPRATROPIUM-ALBUTEROL 0.5-2.5 (3) MG/3ML IN SOLN: 3.0000 mL | Freq: Four times a day (QID) | RESPIRATORY_TRACT | Status: DC | PRN

## 2019-01-12 NOTE — Progress Notes (Signed)
[place] for Heparin (Apixaban on hold) Indication: History of DVT/PE  Allergies  Allergen Reactions  . Lisinopril Swelling  . Statins Other (See Comments)    Elevated liver enzymes.   Patient Measurements: Height: 5\' 9"  (175.3 cm) Weight: 229 lb 11.5 oz (104.2 kg) IBW/kg (Calculated) : 70.7  Vital Signs: Temp: 98 F (36.7 C) (06/25 0736) Temp Source: Oral (06/25 0736) BP: 117/100 (06/25 0736) Pulse Rate: 83 (06/25 0736)  Labs: Recent Labs    01/10/19 0101 01/10/19 2992 01/10/19 4268  01/10/19 2323 01/11/19 0332 01/12/19 0721  HGB 10.6* 11.6*  --   --  9.8*  --  9.9*  HCT 34.2* 34.0*  --   --  30.6*  --  31.6*  PLT 217  --   --   --  208  --  225  APTT  --   --  25   < > 79* 74* 80*  LABPROT  --   --  14.2  --   --   --   --   INR  --   --  1.1  --   --   --   --   HEPARINUNFRC  --   --  0.69   < > 1.03* 0.98* 0.93*  CREATININE 1.45*  --   --   --  1.11  --  1.14   < > = values in this interval not displayed.    Estimated Creatinine Clearance: 80.9 mL/min (by C-G formula based on SCr of 1.14 mg/dL).   Medical History: Past Medical History:  Diagnosis Date  . AKI (acute kidney injury) ([place]) 09/24/2016  . Calcium oxalate crystals in urine 12/23/2011   Asymptomatic, no hematuria. Advised to take plenty of water.  [name] Kitchen COPD (chronic obstructive pulmonary disease) ([place])   . Depression   . Diabetes mellitus without complication ([place]) 3419   pre diabetic  . Hepatitis C   . HIV (human immunodeficiency virus infection) ([place])   . Hypertension   . PE (pulmonary thromboembolism) ([place]) 05/12/2016   PE in 2017, on chronic anticoagulation for this and recurrent DVT.  [name] Kitchen Peripheral arterial disease ([place])    ooccluded left SFA by duplex ultrasound, tibial disease left greater than right  . Prostate cancer metastatic to multiple sites [facility])   . Stroke ([place]) 11/2011   Carotids Doppler negative. Right sided weakness resolved, initially on  Plavix for 3 months and then continued with ASA.    . TB lung, latent 1988   Treated    Assessment: 63 year-old male transfer from [facility] with angioedema requiring intubation. Pt on apixaban PTA for history of DVT/PE. Held apixaban and started heparin. Labs from 0721 today: Confirmatory aPTT level of 80 therapeutic this AM. APTT does not correlate with heparin level of 0.93 this AM. Anticipate using aPTT to dose for now given apixaban influence on heparin levels. Per RN, no signs/symptoms of bleeding or issues with heparin drip.   Goal of Therapy:  Heparin level 0.3-0.7 units/ml aPTT 66-102 seconds Monitor platelets by anticoagulation protocol: Yes   Plan:  Continue heparin at 1600 units/hr Monitor for heparin level correlation with aPTT. Daily CBC/HL/aPTT (d/c aPTT after correlation with heparin level achieved) Monitor for bleeding.   Vassie Moselle, PharmD Candidate 8:54 AM 01/12/2019

## 2019-01-12 NOTE — Progress Notes (Signed)
PCCM Brief Communication Note   I spoke with the patient's wife on the phone and provided daily updates. All questions answered. She is very appreciative of the care her husband is receiving and passes along her greatest thanks to all involved.    Eliseo Gum MSN, AGACNP-BC [place] [phone] If no answer, [phone] 01/12/2019, 1:41 PM

## 2019-01-12 NOTE — Progress Notes (Signed)
NAME:  Jacob Joseph, MRN:  [id number], DOB:  1956/03/27, LOS: 2 ADMISSION DATE:  01/10/2019, CONSULTATION DATE: 6/23 REFERRING MD: Dr. Betsey Holiday EDP, CHIEF COMPLAINT: Angioedema  Brief History   63 year old male presented to the emergency department with tongue swelling shortly after starting on lisinopril.  Nasally intubated in the emergency department transferred to [facility]  History of present illness   Patient is encephalopathic and/or intubated. Therefore history has been obtained from chart review. 63 year old with past medical history as below which is significant for HIV, metastatic prostate cancer, COPD, diabetes mellitus, hepatitis C, PE on anticoagulation, and hypertension.  Prostate cancer is metastatic to multiple sites including the thoracic spine.  He is currently managed on palliative Zytiga and is status post radiation therapy to the spine.  He has been on anticoagulation since 2017 after a second unprovoked PE.  He presented to [facility] emergency department on 6/22 with chief complaint of tongue swelling.  He took lisinopril about 1 hour prior which is apparently new medication for him.  Tongue was significantly swollen upon EMS arrival and he was given Benadryl, Solu-Medrol, and epinephrine in route to the hospital.  Upon arrival to the emergency department his tongue was protruding and he cannot control his secretions.  Endotracheal intubation was attempted however airway was very difficult.  He was nasally intubated by the emergency room physician.  He was then transferred to [facility] for ICU admission.  Past Medical History   has a past medical history of AKI (acute kidney injury) ([place]) (09/24/2016), Calcium oxalate crystals in urine (12/23/2011), COPD (chronic obstructive pulmonary disease) ([employer]), Depression, Diabetes mellitus without complication ([place]) (4010), Hepatitis C, HIV (human immunodeficiency virus infection) ([place]), Hypertension, PE (pulmonary  thromboembolism) ([place]) (05/12/2016), Peripheral arterial disease ([place]), Prostate cancer metastatic to multiple sites [facility]), Stroke ([place]) (11/2011), and TB lung, latent (1988).   Significant Hospital Events   6/23 admitted. Received 2 FFP and started on benadryl, pepcid, solumedrol for angioedema  6/24 remains nasally intubated, remains hemodynamically stable and sedated  6/25 increased oxygen requirement overnight. Tongue swelling is slightly improving  Consults:    Procedures:  NTT (right nare) 6/23>  Significant Diagnostic Tests:  CXR 6/25> low lung volumes, bibasilar opacities suggestive of atelectasis   Micro Data:  6/23 SARS CoV2 negative   Antimicrobials:     Interim history/subjective:  Overnight exhibited an episode of vent dyssynchrony and was given sedation as well as a 1x rocuronium dose. FiO2 increased from 40 to 60% overnight  Objective   Blood pressure (!) 140/92, pulse 79, temperature 98 F (36.7 C), temperature source Oral, resp. rate 16, height 5\' 9"  (1.753 m), weight 104.2 kg, SpO2 95 %.    Vent Mode: PRVC FiO2 (%):  [40 %-60 %] 60 % Set Rate:  [16 bmp] 16 bmp Vt Set:  [560 mL] 560 mL PEEP:  [5 cmH20] 5 cmH20 Plateau Pressure:  [16 cmH20-28 cmH20] 28 cmH20   Intake/Output Summary (Last 24 hours) at 01/12/2019 1023 Last data filed at 01/12/2019 1000 Gross per 24 hour  Intake 2956.34 ml  Output 1595 ml  Net 1361.34 ml   Filed Weights   01/10/19 0008 01/11/19 0500 01/12/19 0407  Weight: 100.5 kg 95.2 kg 104.2 kg    Examination: General: Obese adult male, nasally intubated and ventialted, sedated, NAD  HEENT: Bilateral supraorbital edema, protuberant tongue,  Lungs: CTA, symmetrical chest expansion, synchronous with vent  Cardiovascular: RRR s1s2 no rgm. No JVD. Capillary refill < 3 seconds  Abdomen:  Protuberant, soft, hypoactive bowel sounds x4  Extremities: RUE edema and elevated on pillow. BLE without edema. No cyanosis no clubbing BUE BLE  Neuro:   pinpoint pupils on propofol, grimaces and withdraws to painful stimuli. Does not follow commands  Skin: clean, dry, warm without rash   Resolved Hospital Problem list     Assessment & Plan:   Acute respiratory failure in setting of airway obstruction/angioedema requiring mechanical ventilation Angioedema: likely secondary to ACE-I.  -s/p 2 FFP 6/23 -Lisinopril is discontinued  -C3 160, C4 32 (both WNL)  -Tongue swelling is decreasing -CXR 6/25 with low lung volumes and suggestive of bibasilar atelectasis  P - Continue MV support -Maintain nasal intubation  - Wean PEEP/FiO2 for SpO2  88-92% - AM CXR  - Maintain RASS -3 to -4 given airway difficulty. Currently on prop/fentanyl.   - Monitor triglycerides. If continue to rise, may need to dc propofol  -Continue pepcid, benadryl, solumedrol (6/25 is day 3)  - Do not resume lisinopril   COPD without acute exacerbation - Duoneb, pulmicort  - SpO2 goal 88-92%  DM2 -home metformin, holding  P -Continue SSI   HTN P -PRN hydralazine -Continue tele -if acute HTN, consider pain and sedation as etiology  Leukocytosis, mild Likely in setting of steroids however patient at risk for opportunistic infections  P -Continue to trend Wbc with temperature curve  Constipation, at risk In setting of CNS depressing medications, immobility due to sedation P -PRN suppository   Hx PE 2017 unprovoked now on livelong AC - heparin gtt per pharmacy consult  - monitor coags - monitor for s/sx bleeding  Hx prostate cancer with metastases to thoracic spine  - Hold daily palliative Zytiga for now  HIV Hepatitis C - Hold home Biktarvy untial able to take PO - CD4 count 104 on 6/23    Inadequate oral intake P -No enteral access for nutrition at present.  -Patient currently on propofol and as such is likely not at a great caloric deficit however the patient's triglycerides have increased to 308 on 6/25. Recheck triglycerides 6/26-- if  increasing, consider alternative sedation and initiation of Dextrose containing IVF    Best practice:  Diet: NPO Pain/Anxiety/Delirium protocol (if indicated): propofol/fentanyl RASS goal -3 to -4 VAP protocol (if indicated): Yes DVT prophylaxis: heparin per pharmacy  GI prophylaxis: PPI Glucose control: SSI Mobility: BR Code Status: FULL Family Communication: Pending 6/25  Disposition: ICU  Labs   CBC: Recent Labs  Lab 01/10/19 0101 01/10/19 0639 01/10/19 2323 01/12/19 0721  WBC 15.4*  --  14.4* 12.2*  NEUTROABS 14.0*  --   --   --   HGB 10.6* 11.6* 9.8* 9.9*  HCT 34.2* 34.0* 30.6* 31.6*  MCV 89.5  --  87.2 88.3  PLT 217  --  208 546    Basic Metabolic Panel: Recent Labs  Lab 01/10/19 0101 01/10/19 0639 01/10/19 2323 01/12/19 0721  NA 140 138 139 138  K 3.8 4.4 4.2 4.0  CL 105  --  109 108  CO2 21*  --  21* 19*  GLUCOSE 235*  --  140* 130*  BUN 17  --  16 17  CREATININE 1.45*  --  1.11 1.14  CALCIUM 9.1  --  9.1 8.7*  MG  --   --  2.0  --   PHOS  --   --  4.3  --    GFR: Estimated Creatinine Clearance: 80.9 mL/min (by C-G formula based on SCr of 1.14 mg/dL).  Recent Labs  Lab 01/10/19 0101 01/10/19 2323 01/12/19 0721  WBC 15.4* 14.4* 12.2*    Liver Function Tests: No results for input(s): AST, ALT, ALKPHOS, BILITOT, PROT, ALBUMIN in the last 168 hours. No results for input(s): LIPASE, AMYLASE in the last 168 hours. No results for input(s): AMMONIA in the last 168 hours.  ABG    Component Value Date/Time   PHART 7.311 (L) 01/10/2019 0639   PCO2ART 41.7 01/10/2019 0639   PO2ART 84.0 01/10/2019 0639   HCO3 21.0 01/10/2019 0639   TCO2 22 01/10/2019 0639   ACIDBASEDEF 5.0 (H) 01/10/2019 0639   O2SAT 95.0 01/10/2019 0639     Coagulation Profile: Recent Labs  Lab 01/10/19 0644  INR 1.1    Cardiac Enzymes: No results for input(s): CKTOTAL, CKMB, CKMBINDEX, TROPONINI in the last 168 hours.  HbA1C: Hemoglobin A1C  Date/Time Value Ref Range  Status  05/26/2018 01:57 PM 6.0 (A) 4.0 - 5.6 % Final  03/03/2018 02:02 PM 6.1 (A) 4.0 - 5.6 % Final   Hgb A1c MFr Bld  Date/Time Value Ref Range Status  01/10/2019 06:44 AM 7.0 (H) 4.8 - 5.6 % Final    Comment:    (NOTE) Pre diabetes:          5.7%-6.4% Diabetes:              >6.4% Glycemic control for   <7.0% adults with diabetes   03/08/2012 09:06 PM 5.9 (H) <5.7 % Final    Comment:    (NOTE)                                                                       According to the ADA Clinical Practice Recommendations for 2011, when HbA1c is used as a screening test:  >=6.5%   Diagnostic of Diabetes Mellitus           (if abnormal result is confirmed) 5.7-6.4%   Increased risk of developing Diabetes Mellitus References:Diagnosis and Classification of Diabetes Mellitus,Diabetes CXKG,8185,63(JSHFW 1):S62-S69 and Standards of Medical Care in         Diabetes - 2011,Diabetes Care,2011,34 (Suppl 1):S11-S61.    CBG: Recent Labs  Lab 01/11/19 1510 01/11/19 1941 01/11/19 2347 01/12/19 0340 01/12/19 0741  GLUCAP 115* 135* 102* 137* 125*     Critical care time: 45 minutes     Eliseo Gum MSN, AGACNP-BC [place] [phone] If no answer, [phone] 01/12/2019, 10:23 AM

## 2019-01-12 NOTE — Progress Notes (Signed)
[place] Progress Note Patient Name: Jacob Joseph DOB: 05-07-1956 MRN: [id number]   Date of Service  01/12/2019  HPI/Events of Note  Ventilator dis-synchrony with desaturation  eICU Interventions  Versed 2 mg iv x 1 followed by rocuronium 50 mg x 1 to interrupt dis-synchrony and synchronize patient's drive to vent.     Intervention Category Intermediate Interventions: Hypertension - evaluation and management  Frederik Pear 01/12/2019, 5:40 AM

## 2019-01-12 NOTE — Progress Notes (Signed)
Patients blood pressure has been creeping up over night, now reading 164/109 (126). Notified ELink. Will continue to monitor.   Weldon Inches, RN

## 2019-01-12 NOTE — Progress Notes (Signed)
eLink Physician-Brief Progress Note Patient Name: Jacob Joseph DOB: [birth year] MRN: [id number]   Date of Service  01/12/2019  HPI/Events of Note  Hypertension  eICU Interventions  Hydralazine 10-20 mg iv Q 4 hours prn SBP > 150 mmHg        Madine Sarr U Maleyah Evans 01/12/2019, 5:35 AM

## 2019-01-13 ENCOUNTER — Inpatient Hospital Stay (HOSPITAL_COMMUNITY): Payer: 59

## 2019-01-13 DIAGNOSIS — J9600 Acute respiratory failure, unspecified whether with hypoxia or hypercapnia: Secondary | ICD-10-CM

## 2019-01-13 DIAGNOSIS — L8990 Pressure ulcer of unspecified site, unspecified stage: Secondary | ICD-10-CM

## 2019-01-13 HISTORY — DX: Pressure ulcer of unspecified site, unspecified stage: L89.90

## 2019-01-13 LAB — CBC
HCT: 33.3 % — ABNORMAL LOW (ref 39.0–52.0)
Hemoglobin: 10.2 g/dL — ABNORMAL LOW (ref 13.0–17.0)
MCH: 27.2 pg (ref 26.0–34.0)
MCHC: 30.6 g/dL (ref 30.0–36.0)
MCV: 88.8 fL (ref 80.0–100.0)
Platelets: 224 10*3/uL (ref 150–400)
RBC: 3.75 MIL/uL — ABNORMAL LOW (ref 4.22–5.81)
RDW: 14.6 % (ref 11.5–15.5)
WBC: 12.1 10*3/uL — ABNORMAL HIGH (ref 4.0–10.5)
nRBC: 0 % (ref 0.0–0.2)

## 2019-01-13 LAB — POCT I-STAT 7, (LYTES, BLD GAS, ICA,H+H)
Acid-base deficit: 4 mmol/L — ABNORMAL HIGH (ref 0.0–2.0)
Bicarbonate: 20.8 mmol/L (ref 20.0–28.0)
Calcium, Ion: 1.29 mmol/L (ref 1.15–1.40)
HCT: 29 % — ABNORMAL LOW (ref 39.0–52.0)
Hemoglobin: 9.9 g/dL — ABNORMAL LOW (ref 13.0–17.0)
O2 Saturation: 93 %
Potassium: 3.7 mmol/L (ref 3.5–5.1)
Sodium: 141 mmol/L (ref 135–145)
TCO2: 22 mmol/L (ref 22–32)
pCO2 arterial: 35.9 mmHg (ref 32.0–48.0)
pH, Arterial: 7.371 (ref 7.350–7.450)
pO2, Arterial: 70 mmHg — ABNORMAL LOW (ref 83.0–108.0)

## 2019-01-13 LAB — GLUCOSE, CAPILLARY
Glucose-Capillary: 112 mg/dL — ABNORMAL HIGH (ref 70–99)
Glucose-Capillary: 136 mg/dL — ABNORMAL HIGH (ref 70–99)
Glucose-Capillary: 138 mg/dL — ABNORMAL HIGH (ref 70–99)
Glucose-Capillary: 156 mg/dL — ABNORMAL HIGH (ref 70–99)
Glucose-Capillary: 164 mg/dL — ABNORMAL HIGH (ref 70–99)
Glucose-Capillary: 90 mg/dL (ref 70–99)

## 2019-01-13 LAB — BASIC METABOLIC PANEL
Anion gap: 9 (ref 5–15)
BUN: 17 mg/dL (ref 8–23)
CO2: 19 mmol/L — ABNORMAL LOW (ref 22–32)
Calcium: 8.7 mg/dL — ABNORMAL LOW (ref 8.9–10.3)
Chloride: 111 mmol/L (ref 98–111)
Creatinine, Ser: 1.06 mg/dL (ref 0.61–1.24)
GFR calc Af Amer: >60 mL/min (ref >60)
GFR calc non Af Amer: >60 mL/min (ref >60)
Glucose, Bld: 138 mg/dL — ABNORMAL HIGH (ref 70–99)
Potassium: 4.8 mmol/L (ref 3.5–5.1)
Sodium: 139 mmol/L (ref 135–145)

## 2019-01-13 LAB — APTT: aPTT: 78 seconds — ABNORMAL HIGH (ref 24–36)

## 2019-01-13 LAB — HEPARIN LEVEL (UNFRACTIONATED): Heparin Unfractionated: 0.86 IU/mL — ABNORMAL HIGH (ref 0.30–0.70)

## 2019-01-13 LAB — TRIGLYCERIDES: Triglycerides: 177 mg/dL — ABNORMAL HIGH (ref <150)

## 2019-01-13 MED ORDER — HALOPERIDOL LACTATE 5 MG/ML IJ SOLN
1.0000 mg | INTRAMUSCULAR | Status: DC | PRN
  Administered 2019-01-13: 4 mg via INTRAVENOUS
  Filled 2019-01-13: qty 1

## 2019-01-13 MED ORDER — HALOPERIDOL LACTATE 5 MG/ML IJ SOLN
5.0000 mg | Freq: Once | INTRAMUSCULAR | Status: AC
  Administered 2019-01-13: 5 mg via INTRAVENOUS
  Filled 2019-01-13: qty 1

## 2019-01-13 MED ORDER — ORAL CARE MOUTH RINSE
15.0000 mL | Freq: Two times a day (BID) | OROMUCOSAL | Status: DC
  Administered 2019-01-13 – 2019-01-23 (×16): 15 mL via OROMUCOSAL

## 2019-01-13 MED ORDER — ZIPRASIDONE MESYLATE 20 MG IM SOLR
20.0000 mg | Freq: Four times a day (QID) | INTRAMUSCULAR | Status: DC | PRN
  Administered 2019-01-13 – 2019-01-14 (×2): 20 mg via INTRAMUSCULAR
  Filled 2019-01-13 (×4): qty 20

## 2019-01-13 MED ORDER — DEXMEDETOMIDINE HCL IN NACL 400 MCG/100ML IV SOLN
0.4000 ug/kg/h | INTRAVENOUS | Status: DC
  Administered 2019-01-13: 15:00:00 1.2 ug/kg/h via INTRAVENOUS
  Administered 2019-01-13: 0.8 ug/kg/h via INTRAVENOUS
  Filled 2019-01-13: qty 100
  Filled 2019-01-13: qty 200

## 2019-01-13 MED ORDER — DEXMEDETOMIDINE HCL IN NACL 400 MCG/100ML IV SOLN
0.4000 ug/kg/h | INTRAVENOUS | Status: DC
  Administered 2019-01-13 – 2019-01-14 (×4): 1.2 ug/kg/h via INTRAVENOUS
  Administered 2019-01-14: 09:00:00 0.6 ug/kg/h via INTRAVENOUS
  Administered 2019-01-14 (×2): 1.2 ug/kg/h via INTRAVENOUS
  Administered 2019-01-15 (×2): 0.9 ug/kg/h via INTRAVENOUS
  Administered 2019-01-15: 20:00:00 0.8 ug/kg/h via INTRAVENOUS
  Administered 2019-01-15: 0.7 ug/kg/h via INTRAVENOUS
  Administered 2019-01-15: 1.1 ug/kg/h via INTRAVENOUS
  Administered 2019-01-16: 0.5 ug/kg/h via INTRAVENOUS
  Filled 2019-01-13 (×3): qty 200
  Filled 2019-01-13: qty 100
  Filled 2019-01-13 (×2): qty 200
  Filled 2019-01-13: qty 100
  Filled 2019-01-13: qty 200

## 2019-01-13 NOTE — Progress Notes (Signed)
Patient is having pink/frothy secretions coming from his nose and mouth. It smells like fecal matter. Gave Bisacodyl around 10 PM to see if he was just constipated. His stomach is extremely distended and has gone from soft to firm. Had small bowel movement that was a mucous like. Called ELink for stat KUB for possible ileus. Will continue to monitor.   Weldon Inches, RN

## 2019-01-13 NOTE — Progress Notes (Signed)
[place] Progress Note Patient Name: Jacob Joseph DOB: 1955-09-22 MRN: [id number]   Date of Service  01/13/2019  HPI/Events of Note  Abdominal distension r/o ileus  eICU Interventions  Stat  KUB ordered        Frederik Pear 01/13/2019, 5:25 AM

## 2019-01-13 NOTE — Procedures (Signed)
Extubation Procedure Note  Patient Details:   Name: Jacob Joseph DOB: 1956-02-20 MRN: [id number]   Airway Documentation:    Vent end date: 01/13/19 Vent end time: 0952   Evaluation  O2 sats: [place] after extubation patient was placed on a non-re breather  Complications: No apparent complications Patient did tolerate procedure well. Bilateral Breath Sounds: Rhonchi   No waiting for patient to wake up more.  Patient was extubated with 2 RT's, RN & CCM MD at the bedside. Bougie was placed in ETT & ETT was removed. Patient was originally placed on 6L [place] but SAT's declined & patient was placed on a non-re breather. Patient did have a positive cuff leak prior to extubation. No stridor noted at this time.   Elaynah [place], Eddie North 01/13/2019, 9:52 AM

## 2019-01-13 NOTE — Progress Notes (Signed)
[place] for Heparin (Apixaban on hold) Indication: History of DVT/PE  Allergies  Allergen Reactions  . Lisinopril Swelling  . Statins Other (See Comments)    Elevated liver enzymes.   Patient Measurements: Height: 5\' 9"  (175.3 cm) Weight: 230 lb 2.6 oz (104.4 kg) IBW/kg (Calculated) : 70.7  Vital Signs: Temp: 97.6 F (36.4 C) (06/26 0700) Temp Source: Axillary (06/26 0700) BP: 195/114 (06/26 0952) Pulse Rate: 113 (06/26 0952)  Labs: Recent Labs    01/10/19 2323 01/11/19 0332 01/12/19 0721 01/13/19 0420  HGB 9.8*  --  9.9* 10.2*  HCT 30.6*  --  31.6* 33.3*  PLT 208  --  225 224  APTT 79* 74* 80* 78*  HEPARINUNFRC 1.03* 0.98* 0.93* 0.86*  CREATININE 1.11  --  1.14 1.06    Estimated Creatinine Clearance: 87.2 mL/min (by C-G formula based on SCr of 1.06 mg/dL).   Medical History: Past Medical History:  Diagnosis Date  . AKI (acute kidney injury) ([place]) 09/24/2016  . Calcium oxalate crystals in urine 12/23/2011   Asymptomatic, no hematuria. Advised to take plenty of water.  [name] Kitchen COPD (chronic obstructive pulmonary disease) ([place])   . Depression   . Diabetes mellitus without complication ([place]) 7425   pre diabetic  . Hepatitis C   . HIV (human immunodeficiency virus infection) ([place])   . Hypertension   . PE (pulmonary thromboembolism) ([place]) 05/12/2016   PE in 2017, on chronic anticoagulation for this and recurrent DVT.  [name] Kitchen Peripheral arterial disease ([place])    ooccluded left SFA by duplex ultrasound, tibial disease left greater than right  . Prostate cancer metastatic to multiple sites [facility])   . Stroke ([place]) 11/2011   Carotids Doppler negative. Right sided weakness resolved, initially on Plavix for 3 months and then continued with ASA.    . TB lung, latent 1988   Treated    Assessment: 63 year-old male transfer from [facility] with angioedema requiring intubation. Pt on apixaban PTA for history of DVT/PE. Held apixaban and started  heparin. Labs from 0420 today: aPTT level of 78 therapeutic this AM. aPTT does not correlate with heparin level of 0.86 this AM. Anticipate using aPTT to dose for now given apixaban influence on heparin levels. Per RN, small amount of blood from nostrils this AM but attributes this to trauma from tubing.  Goal of Therapy:  Heparin level 0.3-0.7 units/ml aPTT 66-102 seconds Monitor platelets by anticoagulation protocol: Yes   Plan:  Continue heparin at 1600 units/hr Monitor for heparin level correlation with aPTT. Daily CBC/HL/aPTT (d/c aPTT after correlation with heparin level achieved) Monitor for bleeding.   Vassie Moselle, PharmD Candidate 10:37 AM 01/12/2019

## 2019-01-13 NOTE — Progress Notes (Signed)
Nutrition Follow-up  RD working remotely.  DOCUMENTATION CODES:   Obesity unspecified  INTERVENTION:   - RD will monitor for diet advancement and provide oral nutrition supplement as appropriate  NUTRITION DIAGNOSIS:   Increased nutrient needs related to cancer and cancer related treatments as evidenced by estimated needs.  Ongoing  GOAL:   Patient will meet greater than or equal to 90% of their needs  Unmet at this time  MONITOR:   Diet advancement, Labs, I & O's, Weight trends  REASON FOR ASSESSMENT:   Ventilator    ASSESSMENT:   Patient with PMH significant for COPD, depression, DM, Hepatitis C, HIV, HTN, PAD, CVA, and prostate cancer metastatic to multiple sites. Presents this admission with angioedema likely secondary to ACE-I.  6/26 - extubated  Pt was nasally intubated and multiple attempts were made to place OGT and NGT. Pt did not have enteral access while intubated. Plan was for [place] today but pt has now been extubated.  Per CCM, pt to remain NPO for 8 hours after extubation. RD will follow for diet advancement and provide oral nutrition supplement as appropriate.  Per RN documentation, pt's abdomen is distended and there are hypoactive bowel sounds.  Reviewed RN edema assessment. Pt with non-pitting facial edema.  Medications reviewed and include: SSI, Solu-medrol, IV Pepcid, heparin IVF: NS @ 75 ml/hr  Labs reviewed: TG 177 CBG's: 90-156 x 24 hours  UOP: 2035 ml x 24 hours I/O's: +5.2 L since admit  Diet Order:   Diet Order            Diet NPO time specified  Diet effective now              EDUCATION NEEDS:   No education needs have been identified at this time  Skin:  Skin Assessment: Reviewed RN Assessment  Last BM:  01/13/19 small type 7  Height:   Ht Readings from Last 1 Encounters:  01/10/19 5\' 9"  (1.753 m)    Weight:   Wt Readings from Last 1 Encounters:  01/13/19 104.4 kg    Ideal Body Weight:  72.7 kg  BMI:   Body mass index is 33.99 kg/m.  Estimated Nutritional Needs:   Kcal:  2200-2400  Protein:  110-125 grams  Fluid:  >/= 2.0 L    Gaynell Face, MS, RD, LDN Inpatient Clinical Dietitian Pager: [phone] Weekend/After Hours: [phone]

## 2019-01-13 NOTE — Progress Notes (Signed)
Bite block in place per MD verbal order.

## 2019-01-13 NOTE — Consult Note (Signed)
[place] Nurse wound consult note Reason for Consult: sacrum Wound type: Deep Tissue Pressure Injury Pressure Injury POA: /No/ Measurement:9cm x 11cm x 0cm  Wound bed: dark colored, non blanchable tissue over the sacrum and inner buttocks, slightly blistered centrally  Drainage (amount, consistency, odor) none Periwound: intact  Dressing procedure/placement/frequency: Continue silicone foam to protect Orders update to make sure staff assess under the dressing each shift for changes/evolution  [place] Nurse team will follow with you and see patient within 10 days for wound assessments.  Please notify [place] nurses of any acute changes in the wounds or any new areas of concern P[place] MSN, [place], [place], [place]

## 2019-01-13 NOTE — Progress Notes (Signed)
NAME:  Jacob Joseph, MRN:  [id number], DOB:  09-23-1955, LOS: 3 ADMISSION DATE:  01/10/2019, CONSULTATION DATE: 6/23 REFERRING MD: Dr. Betsey Holiday EDP, CHIEF COMPLAINT: Angioedema  Brief History   63 year old male presented to the emergency department with tongue swelling shortly after starting on lisinopril.  Nasally intubated in the emergency department transferred to [facility].  History of present illness    63 year old Presented to [facility] emergency department on 6/22 with chief complaint of tongue swelling.  He took lisinopril about 1 hour prior which is apparently new medication for him.  Tongue was significantly swollen upon EMS arrival and he was given Benadryl, Solu-Medrol, and epinephrine in route to the hospital.  Upon arrival to the emergency department his tongue was protruding and he cannot control his secretions.  Endotracheal intubation was attempted however airway was very difficult.  He was nasally intubated by the emergency room physician.  He was then transferred to [facility] for ICU admission.  Past Medical History    significant for HIV, metastatic prostate cancer, COPD, diabetes mellitus, hepatitis C, PE on anticoagulation, and hypertension.  Prostate cancer is metastatic to multiple sites including the thoracic spine.  He is currently managed on palliative Zytiga and is status post radiation therapy to the spine.  He has been on anticoagulation since 2017 after a second unprovoked PE.    Significant Hospital Events   6/23 admitted. Received 2 FFP and started on benadryl, pepcid, solumedrol for angioedema  6/24 remains nasally intubated, remains hemodynamically stable and sedated  6/25 increased oxygen requirement overnight. Tongue swelling is slightly improving  Consults:    Procedures:  NTT (right nare) 6/23>  Significant Diagnostic Tests:  CXR 6/25> low lung volumes, bibasilar opacities suggestive of atelectasis   Micro Data:  6/23 SARS CoV2  negative   Antimicrobials:     Interim history/subjective:   Remains critically ill, nasally intubated Sedated on propofol and fentanyl Afebrile Urine output adequate   Objective   Blood pressure (!) 162/106, pulse 75, temperature 97.6 F (36.4 C), temperature source Axillary, resp. rate 16, height 5\' 9"  (1.753 m), weight 104.4 kg, SpO2 96 %.    Vent Mode: PRVC FiO2 (%):  [50 %-60 %] 50 % Set Rate:  [16 bmp] 16 bmp Vt Set:  [560 mL] 560 mL PEEP:  [5 cmH20] 5 cmH20 Plateau Pressure:  [16 cmH20-27 cmH20] 26 cmH20   Intake/Output Summary (Last 24 hours) at 01/13/2019 0858 Last data filed at 01/13/2019 0800 Gross per 24 hour  Intake 3641.39 ml  Output 2035 ml  Net 1606.39 ml   Filed Weights   01/11/19 0500 01/12/19 0407 01/13/19 0500  Weight: 95.2 kg 104.2 kg 104.4 kg    Examination: General: Obese adult male, nasally intubated and ventilated, sedated HEENT: Bilateral supraorbital edema, protuberant tongue with bite block Lungs: CTA, symmetrical chest expansion, synchronous with vent  Cardiovascular: RRR s1s2 no rgm. No JVD. Capillary refill < 3 seconds  Abdomen: Protuberant, soft, hypoactive bowel sounds  Extremities: RUE edema and elevated on pillow. BLE without edema. No cyanosis no clubbing BUE BLE  Neuro:  Does not follow commands while on sedation Skin: clean, dry, warm without rash   Chest x-ray 6/26 personally reviewed which does not show any new infiltrates, ET tube in position  Resolved Hospital Problem list     Assessment & Plan:   Acute respiratory failure in setting of airway obstruction/angioedema requiring mechanical ventilation Angioedema: likely secondary to ACE-I.  -s/p 2 FFP 6/23 -Lisinopril is  discontinued  -C3 160, C4 32 (both WNL)  -Tongue swelling is decreasing  P -We checked that he had a cuff leak, he was extubated at bedside after placing a bougie through the nasal ET tube, tongue swelling seems to have decreased, he had no stridor post  extubation but was hypoxic requiring nonrebreather, mild bleeding from the nose and upper airway secretions were suctioned-continues to need tracheobronchial toilet -Continue pepcid, benadryl, solumedrol (6/25 is day 3)  - Do not resume lisinopril  -N.p.o. for 8 hours  COPD without acute exacerbation - Duoneb, pulmicort  - SpO2 goal 88-92%  DM2 -home metformin, holding  P -Continue SSI   HTN P -PRN hydralazine -Continue tele    Constipation, at risk In setting of CNS depressing medications, immobility due to sedation P -Other dose of Dulcolax given  Hx PE 2017 unprovoked now on livelong AC - heparin gtt per pharmacy consult  - monitor coags - monitor nasal bleeding, if worse may have to turn off heparin  Hx prostate cancer with metastases to thoracic spine  - Hold daily palliative Zytiga for now  HIV Hepatitis C - Hold home Biktarvy untial able to take PO - CD4 count 104 on 6/23       Best practice:  Diet: NPO Pain/Anxiety/Delirium protocol (if indicated): DC  VAP protocol (if indicated): Yes DVT prophylaxis: heparin per pharmacy  GI prophylaxis: PPI Glucose control: SSI Mobility: BR Code Status: FULL Family Communication:  6/25  Disposition: ICU   The patient is critically ill with multiple organ systems failure and requires high complexity decision making for assessment and support, frequent evaluation and titration of therapies, application of advanced monitoring technologies and extensive interpretation of multiple databases. Critical Care Time devoted to patient care services described in this note independent of APP/resident  time is 35 minutes.   Kara Mead MD. [place] Flood. [place] [facility] Pager [phone] If no response call [phone]    01/13/2019, 8:58 AM

## 2019-01-13 NOTE — Progress Notes (Signed)
At shift change I was informed of a possible DTI on the sacrum. During his bath, I checked under the foam pad and revealed a dark tinted area around the sacrum. Wound consult ordered to verification.   Weldon Inches, RN

## 2019-01-14 DIAGNOSIS — G9340 Encephalopathy, unspecified: Secondary | ICD-10-CM

## 2019-01-14 LAB — HEPARIN LEVEL (UNFRACTIONATED): Heparin Unfractionated: 0.41 IU/mL (ref 0.30–0.70)

## 2019-01-14 LAB — GLUCOSE, CAPILLARY
Glucose-Capillary: 153 mg/dL — ABNORMAL HIGH (ref 70–99)
Glucose-Capillary: 127 mg/dL — ABNORMAL HIGH (ref 70–99)
Glucose-Capillary: 123 mg/dL — ABNORMAL HIGH (ref 70–99)
Glucose-Capillary: 75 mg/dL (ref 70–99)
Glucose-Capillary: 132 mg/dL — ABNORMAL HIGH (ref 70–99)

## 2019-01-14 LAB — CBC
HCT: 30.5 % — ABNORMAL LOW (ref 39.0–52.0)
Hemoglobin: 9.5 g/dL — ABNORMAL LOW (ref 13.0–17.0)
MCH: 27.1 pg (ref 26.0–34.0)
MCHC: 31.1 g/dL (ref 30.0–36.0)
MCV: 87.1 fL (ref 80.0–100.0)
Platelets: 189 10*3/uL (ref 150–400)
RBC: 3.5 MIL/uL — ABNORMAL LOW (ref 4.22–5.81)
RDW: 14.5 % (ref 11.5–15.5)
WBC: 12.3 10*3/uL — ABNORMAL HIGH (ref 4.0–10.5)
nRBC: 0 % (ref 0.0–0.2)

## 2019-01-14 LAB — BASIC METABOLIC PANEL
Anion gap: 8 (ref 5–15)
BUN: 16 mg/dL (ref 8–23)
CO2: 20 mmol/L — ABNORMAL LOW (ref 22–32)
Calcium: 8.9 mg/dL (ref 8.9–10.3)
Chloride: 111 mmol/L (ref 98–111)
Creatinine, Ser: 0.89 mg/dL (ref 0.61–1.24)
GFR calc Af Amer: >60 mL/min (ref >60)
GFR calc non Af Amer: >60 mL/min (ref >60)
Glucose, Bld: 164 mg/dL — ABNORMAL HIGH (ref 70–99)
Potassium: 3.8 mmol/L (ref 3.5–5.1)
Sodium: 139 mmol/L (ref 135–145)

## 2019-01-14 LAB — APTT: aPTT: 54 seconds — ABNORMAL HIGH (ref 24–36)

## 2019-01-14 MED ORDER — FAMOTIDINE 20 MG PO TABS
20.0000 mg | ORAL_TABLET | Freq: Every day | ORAL | Status: DC
  Administered 2019-01-15: 20 mg via ORAL
  Filled 2019-01-14: qty 1

## 2019-01-14 MED ORDER — QUETIAPINE FUMARATE 50 MG PO TABS
200.0000 mg | ORAL_TABLET | Freq: Three times a day (TID) | ORAL | Status: DC
  Administered 2019-01-14: 200 mg via ORAL
  Filled 2019-01-14 (×2): qty 4

## 2019-01-14 MED ORDER — FAMOTIDINE IN NACL 20-0.9 MG/50ML-% IV SOLN: 20.0000 mg | Freq: Every day | INTRAVENOUS | Status: DC

## 2019-01-14 MED ORDER — BICTEGRAVIR-EMTRICITAB-TENOFOV 50-200-25 MG PO TABS
1.0000 | ORAL_TABLET | Freq: Every day | ORAL | Status: DC
  Administered 2019-01-14 – 2019-01-23 (×10): 1 via ORAL
  Filled 2019-01-14 (×10): qty 1

## 2019-01-14 MED ORDER — METHYLPREDNISOLONE SODIUM SUCC 125 MG IJ SOLR: 40.0000 mg | Freq: Two times a day (BID) | INTRAMUSCULAR | Status: DC

## 2019-01-14 MED ORDER — HALOPERIDOL LACTATE 5 MG/ML IJ SOLN
5.0000 mg | Freq: Four times a day (QID) | INTRAMUSCULAR | Status: DC | PRN
  Administered 2019-01-14 – 2019-01-16 (×4): 5 mg via INTRAVENOUS
  Filled 2019-01-14 (×4): qty 1

## 2019-01-14 MED ORDER — METHYLPREDNISOLONE SODIUM SUCC 125 MG IJ SOLR
40.0000 mg | Freq: Every day | INTRAMUSCULAR | Status: DC
  Administered 2019-01-14: 40 mg via INTRAVENOUS
  Filled 2019-01-14: qty 2

## 2019-01-14 MED ORDER — GABAPENTIN 300 MG PO CAPS
600.0000 mg | ORAL_CAPSULE | Freq: Two times a day (BID) | ORAL | Status: DC
  Administered 2019-01-14 – 2019-01-23 (×19): 600 mg via ORAL
  Filled 2019-01-14 (×20): qty 2

## 2019-01-14 MED ORDER — APIXABAN 5 MG PO TABS
5.0000 mg | ORAL_TABLET | Freq: Two times a day (BID) | ORAL | Status: DC
  Administered 2019-01-14 – 2019-01-23 (×19): 5 mg via ORAL
  Filled 2019-01-14 (×20): qty 1

## 2019-01-14 NOTE — Evaluation (Signed)
Physical Therapy Evaluation Patient Details Name: Jacob Joseph MRN: [id number] DOB: 1955-07-29 Today's Date: 01/14/2019   History of Present Illness  63 year old male presented to the emergency department with tongue swelling shortly after starting on lisinopril.  Nasally intubated in the emergency department transferred to [facility].  Intubated 6/22-6/26  Clinical Impression  Pt admitted with above diagnosis. Pt currently with functional limitations due to the deficits listed below (see PT Problem List). Pt was able to sit EOB with min guard assist.  Pt needing +2 mod to max assist to stand at EOB.  Could not stand fully upright or take steps to [facility].  May need SNF.   Pt will benefit from skilled PT to increase their independence and safety with mobility to allow discharge to the venue listed below.    Follow Up Recommendations SNF;Supervision/Assistance - 24 hour    Equipment Recommendations  Other (comment)(TBA)    Recommendations for Other Services       Precautions / Restrictions Precautions Precautions: Fall Restrictions Weight Bearing Restrictions: No      Mobility  Bed Mobility Overal bed mobility: Needs Assistance Bed Mobility: Supine to Sit;Sit to Supine     Supine to sit: Mod assist Sit to supine: Total assist;+2 for physical assistance   General bed mobility comments: Pt needed assist for LEs and for elevation of trunk with pt pulling up on PT hands.   Transfers Overall transfer level: Needs assistance Equipment used: 2 person hand held assist Transfers: Sit to/from Stand;Lateral/Scoot Transfers Sit to Stand: Max assist;+2 physical assistance;From elevated surface        Lateral/Scoot Transfers: Mod assist;Max assist General transfer comment: Attempts to stand with 1 person were difficult with pt partial standing and could not take steps to [facility] as he had all weight on his left LE and was rotating trunk to left.  Asked nurse to assist and when nurse came in  still mod assist to scoot pt to his right as he would never take a step to [facility] therefore just guided hips to right while pt squatting.  Had pt laterall scoot the rest of the way and was still mod to max assist.    Ambulation/Gait             General Gait Details: unable  Stairs            Wheelchair Mobility    Modified Rankin (Stroke Patients Only)       Balance                                             Pertinent Vitals/Pain Pain Assessment: No/denies pain    Home Living Family/patient expects to be discharged to:: Private residence Living Arrangements: Spouse/significant other Available Help at Discharge: Family;Available 24 hours/day Type of Home: Apartment Home Access: Stairs to enter Entrance Stairs-Rails: Right;Left;Can reach both Entrance Stairs-Number of Steps: 3 Home Layout: One level Home Equipment: Cane - single point      Prior Function Level of Independence: Independent with assistive device(s)         Comments: Community ambulator with [facility], wife drives     Hand Dominance   Dominant Hand: Right    Extremity/Trunk Assessment   Upper Extremity Assessment Upper Extremity Assessment: Defer to OT evaluation    Lower Extremity Assessment Lower Extremity Assessment: LLE deficits/detail;RLE deficits/detail RLE Deficits / Details: grossly 3-/5  LLE Deficits / Details: grossly 3-/5    Cervical / Trunk Assessment Cervical / Trunk Assessment: Normal  Communication   Communication: No difficulties  Cognition Arousal/Alertness: Lethargic Behavior During Therapy: Flat affect;Impulsive Overall Cognitive Status: Impaired/Different from baseline Area of Impairment: Orientation;Memory;Following commands;Safety/judgement;Awareness;Problem solving                 Orientation Level: Disoriented to;Place;Time;Situation   Memory: Decreased short-term memory Following Commands: Follows one step commands  inconsistently;Follows one step commands with increased time Safety/Judgement: Decreased awareness of safety;Decreased awareness of deficits   Problem Solving: Slow processing;Decreased initiation;Difficulty sequencing;Requires verbal cues;Requires tactile cues General Comments: Pt confused,  delayed response time as well.        General Comments General comments (skin integrity, edema, etc.): VSS    Exercises     Assessment/Plan    PT Assessment Patient needs continued PT services  PT Problem List Decreased activity tolerance;Decreased balance;Decreased mobility;Decreased knowledge of use of DME;Decreased safety awareness;Decreased coordination;Decreased cognition;Decreased knowledge of precautions       PT Treatment Interventions DME instruction;Gait training;Functional mobility training;Therapeutic activities;Therapeutic exercise;Balance training;Patient/family education    PT Goals (Current goals can be found in the Care Plan section)  Acute Rehab PT Goals Patient Stated Goal: unable to state PT Goal Formulation: Patient unable to participate in goal setting Time For Goal Achievement: 01/28/19 Potential to Achieve Goals: Good    Frequency Min 3X/week   Barriers to discharge Decreased caregiver support      Co-evaluation               AM-PAC PT "6 Clicks" Mobility  Outcome Measure Help needed turning from your back to your side while in a flat bed without using bedrails?: A Lot Help needed moving from lying on your back to sitting on the side of a flat bed without using bedrails?: A Lot Help needed moving to and from a bed to a chair (including a wheelchair)?: Total Help needed standing up from a chair using your arms (e.g., wheelchair or bedside chair)?: Total Help needed to walk in hospital room?: Total Help needed climbing 3-5 steps with a railing? : Total 6 Click Score: 8    End of Session Equipment Utilized During Treatment: Gait  belt;Oxygen(6LO2) Activity Tolerance: Patient limited by fatigue Patient left: in bed;with call bell/phone within reach;with bed alarm set(placed pt in chair position) Nurse Communication: Mobility status PT Visit Diagnosis: Unsteadiness on feet (R26.81);Muscle weakness (generalized) (M62.81)    Time: 6333-5456 PT Time Calculation (min) (ACUTE ONLY): 24 min   Charges:   PT Evaluation $PT Eval Moderate Complexity: 1 Mod PT Treatments $Therapeutic Activity: 8-22 mins        Chace Klippel,PT Acute Rehabilitation Services Pager:  [phone]  Office:  [place] 01/14/2019, 1:38 PM

## 2019-01-14 NOTE — Progress Notes (Signed)
NAME:  Jacob Joseph, MRN:  [id number], DOB:  12/03/1955, LOS: 4 ADMISSION DATE:  01/10/2019, CONSULTATION DATE: 6/23 REFERRING MD: Dr. Betsey Holiday EDP, CHIEF COMPLAINT: Angioedema  Brief History   63 year old male presented to the emergency department with tongue swelling shortly after starting on lisinopril.  Nasally intubated in the emergency department transferred to [facility].  History of present illness    63 year old Presented to [facility] emergency department on 6/22 with chief complaint of tongue swelling.  He took lisinopril about 1 hour prior which is apparently new medication for him.  Tongue was significantly swollen upon EMS arrival and he was given Benadryl, Solu-Medrol, and epinephrine in route to the hospital.  Upon arrival to the emergency department his tongue was protruding and he cannot control his secretions.  Endotracheal intubation was attempted however airway was very difficult.  He was nasally intubated by the emergency room physician.  He was then transferred to [facility] for ICU admission.  Past Medical History    significant for HIV, metastatic prostate cancer, COPD, diabetes mellitus, hepatitis C, PE on anticoagulation, and hypertension.  Prostate cancer is metastatic to multiple sites including the thoracic spine.  He is currently managed on palliative Zytiga and is status post radiation therapy to the spine.  He has been on anticoagulation since 2017 after a second unprovoked PE.    Significant Hospital Events   6/23 admitted. Received 2 FFP and started on benadryl, pepcid, solumedrol for angioedema  6/24 remains nasally intubated, remains hemodynamically stable and sedated  6/25 increased oxygen requirement overnight. Tongue swelling is slightly improving  6/26 severe agitation post extubation requiring Precedex, Haldol and Geodon, &`` BiPAP  Consults:    Procedures:  NTT (right nare) 6/23> 6/26  Significant Diagnostic Tests:    Micro Data:   6/23 SARS CoV2 negative   Antimicrobials:     Interim history/subjective:   Received Geodon overnight for 2 doses Unresponsive this morning on Precedex and BiPAP Afebrile Good urine output    Objective   Blood pressure (!) 148/76, pulse (!) 57, temperature (!) 97.5 F (36.4 C), temperature source Axillary, resp. rate 20, height 5\' 9"  (1.753 m), weight 104.2 kg, SpO2 100 %.    Vent Mode: BIPAP FiO2 (%):  [50 %-100 %] 50 % Set Rate:  [17 bmp] 17 bmp PEEP:  [5 cmH20-6 cmH20] 6 cmH20 Pressure Support:  [5 cmH20-10 cmH20] 10 cmH20   Intake/Output Summary (Last 24 hours) at 01/14/2019 0855 Last data filed at 01/14/2019 0800 Gross per 24 hour  Intake 2589.48 ml  Output 3420 ml  Net -830.52 ml   Filed Weights   01/12/19 0407 01/13/19 0500 01/14/19 0400  Weight: 104.2 kg 104.4 kg 104.2 kg    Examination: General: Obese adult male, sedated on Precedex HEENT: No pallor, no icterus, full facemask Lungs: CTA, symmetrical chest expansion Cardiovascular: RRR s1s2 no rgm. No JVD Abdomen: Nontender, soft, hypoactive bowel sounds  Extremities: RUE edema  BLE without edema. No cyanosis no clubbing  Neuro: Follows commands when Precedex lowered Skin: clean, dry, warm without rash   Chest x-ray 6/26 personally reviewed which does not show any new infiltrates, ET tube in position  Resolved Hospital Problem list     Assessment & Plan:   Acute respiratory failure in setting of airway obstruction/angioedema requiring mechanical ventilation Angioedema: likely secondary to ACE-I.  -s/p 2 FFP 6/23 -Lisinopril is discontinued  -C3 160, C4 32 (both WNL)  -Tongue swelling decreased  P -Continue pepcid, dc benadryl -  Decrease Solu-Medrol 40 daily - Do not resume lisinopril  -Attempt off BiPAP and if tolerates proceed with swallow evaluation  Acute encephalopathy-discontinue Geodon since this is oversedated Use Haldol 5 every 6 hours as needed since QT is borderline  prolonged Continue Precedex but hopefully will need lower doses Goal is to restart home dose of Seroquel when able to take p.o.  COPD without acute exacerbation - Duoneb, pulmicort  - SpO2 goal 88-92%  DM2 -home metformin, holding  P -Continue SSI   HTN P -PRN hydralazine -Continue tele    Constipation, at risk In setting of CNS depressing medications, immobility due to sedation P - Dulcolax prn  Hx PE 2017 unprovoked now on livelong AC - heparin gtt per pharmacy consult  - monitor nasal bleeding, if worse may have to turn off heparin  Hx prostate cancer with metastases to thoracic spine  - Hold daily palliative Zytiga for now  HIV Hepatitis C - Hold home Biktarvy untial able to take PO - CD4 count 104 on 6/23    Summary-noted agitation better controlled hopefully who come off BiPAP and proceed with swallow evaluation and then resume oral meds including Seroquel, anticoagulation and HIV meds   Best practice:  Diet: NPO Pain/Anxiety/Delirium protocol (if indicated): DC  VAP protocol (if indicated): Yes DVT prophylaxis: heparin per pharmacy  GI prophylaxis: PPI Glucose control: SSI Mobility: BR Code Status: FULL Family Communication:  6/26 wife  Disposition: ICU  The patient is critically ill with multiple organ systems failure and requires high complexity decision making for assessment and support, frequent evaluation and titration of therapies, application of advanced monitoring technologies and extensive interpretation of multiple databases. Critical Care Time devoted to patient care services described in this note independent of APP/resident  time is 32 minutes.     Kara Mead MD. Shade Flood. [place] [facility] Pager [phone] If no response call [phone]    01/14/2019, 8:55 AM

## 2019-01-14 NOTE — Progress Notes (Signed)
[place] for Heparin Indication: History of DVT/PE  Allergies  Allergen Reactions  . Lisinopril Swelling  . Statins Other (See Comments)    Elevated liver enzymes.   Patient Measurements: Height: 5\' 9"  (175.3 cm) Weight: 229 lb 11.5 oz (104.2 kg) IBW/kg (Calculated) : 70.7  Heparin dosing weight = 92kg  Vital Signs: Temp: 97.5 F (36.4 C) (06/27 0000) Temp Source: Axillary (06/27 0000) BP: 148/76 (06/27 0800) Pulse Rate: 57 (06/27 0800)  Labs: Recent Labs    01/12/19 0721 01/13/19 0420 01/13/19 1534 01/14/19 0330  HGB 9.9* 10.2* 9.9* 9.5*  HCT 31.6* 33.3* 29.0* 30.5*  PLT 225 224  --  189  APTT 80* 78*  --  54*  HEPARINUNFRC 0.93* 0.86*  --  0.41  CREATININE 1.14 1.06  --  0.89    Estimated Creatinine Clearance: 103.7 mL/min (by C-G formula based on SCr of 0.89 mg/dL).   Assessment: 62 YOM transfer from [facility] with angioedema requiring intubation. Patient is on Eliquis PTA for history of DVT/PE.  Pharmacy consulted to dose IV heparin while Eliquis is on hold.  Currently using aPTT to guide heparin dosing because Eliquis is falsely elevating heparin levels.  APTT is sub-therapeutic this AM.  No issue with heparin infusion per RN.  No bleeding reported.  Goal of Therapy:  Heparin level 0.3-0.7 units/ml aPTT 66-102 seconds Monitor platelets by anticoagulation protocol: Yes   Plan:  Increase heparin gtt to 1750 units/hr Check 6 hr aPTT Daily heparin level, aPTT and CBC  Denisia Harpole D. Mina Marble, PharmD, BCPS, [place] 01/14/2019, 9:04 AM

## 2019-01-14 NOTE — Evaluation (Signed)
Clinical/Bedside Swallow Evaluation Patient Details  Name: Jacob Joseph MRN: [id number] Date of Birth: Apr 11, 1956  Today's Date: 01/14/2019 Time: SLP Start Time (ACUTE ONLY): 4270 SLP Stop Time (ACUTE ONLY): 1435 SLP Time Calculation (min) (ACUTE ONLY): 20 min  Past Medical History:  Past Medical History:  Diagnosis Date  . AKI (acute kidney injury) ([place]) 09/24/2016  . Calcium oxalate crystals in urine 12/23/2011   Asymptomatic, no hematuria. Advised to take plenty of water.  [name] Kitchen COPD (chronic obstructive pulmonary disease) ([place])   . Depression   . Diabetes mellitus without complication ([place]) 6237   pre diabetic  . Hepatitis C   . HIV (human immunodeficiency virus infection) ([place])   . Hypertension   . PE (pulmonary thromboembolism) ([place]) 05/12/2016   PE in 2017, on chronic anticoagulation for this and recurrent DVT.  [name] Kitchen Peripheral arterial disease ([place])    ooccluded left SFA by duplex ultrasound, tibial disease left greater than right  . Prostate [facility] to [name])   . Stroke ([place]) 11/2011   Carotids Doppler negative. Right sided weakness resolved, initially on Plavix for 3 months and then continued with ASA.    . TB lung, latent 1988   Treated   Past Surgical History:  Past Surgical History:  Procedure Laterality Date  . HERNIA REPAIR    . INGUINAL HERNIA REPAIR  01/16/2012   Procedure: HERNIA REPAIR INGUINAL INCARCERATED;  Surgeon: Zenovia Jarred, MD;  Location: [place];  Service: General;  Laterality: Right;  . TEE WITHOUT CARDIOVERSION  12/07/2011   Procedure: TRANSESOPHAGEAL ECHOCARDIOGRAM (TEE);  Surgeon: Birdie Riddle, MD;  Location: [facility] ENDOSCOPY;  Service: Cardiovascular;  Laterality: N/A;   HPI:  Patient is a 63 y.o. male who presented to the emergency department with tongue swelling shortly after starting on lisinopril.  Nasally intubated in the emergency department transferred to [facility].  Intubated 6/22-6/26. He had to go on BiPap after  extubation secondary to O2 sats dropping but per RN, he has been off BiPap since 9am this morning.   Assessment / Plan / Recommendation Clinical Impression  Patient presents with a mild oropharyngeal dysphagia which is likely secondary to post extubation of 5 days. He exhibited mildly decreased laryngeal elevation and pharyngeal contraction as well as delayed throat clearing with thin liquids (water) and ice chips. He also had poor awareness of ice chip boluses, telling SLP that he had swallowed them when they were still on his tongue. He did not exhibit residuals in oral cavity with puree (applesauce) bites. Voice was mildly hoarse and with low vocal intensity, but was clear and remained clear throughout PO trials durin this assessment. As patient is still not fully alert and able to actively participate in PO intake, recommend continuing with NPO except for meds whole in puree and allow ice chips or sips of water after oral care. Will likely be able to initaite PO diet when mentation improves. SLP Visit Diagnosis: Dysphagia, unspecified (R13.10)    Aspiration Risk  Mild aspiration risk;Moderate aspiration risk    Diet Recommendation NPO except meds;Ice chips PRN after oral care   Medication Administration: Whole meds with puree Compensations: Minimize environmental distractions    Other  Recommendations Oral Care Recommendations: Oral care BID   Follow up Recommendations Other (comment)(TBD)      Frequency and Duration min 2x/week  1 week       Prognosis Prognosis for Safe Diet Advancement: Good Barriers to Reach Goals: Time post onset  Swallow Study   General Date of Onset: 01/10/19 HPI: Patient is a 63 y.o. male who presented to the emergency department with tongue swelling shortly after starting on lisinopril.  Nasally intubated in the emergency department transferred to [facility].  Intubated 6/22-6/26. He had to go on BiPap after extubation secondary to O2 sats dropping but  per RN, he has been off BiPap since 9am this morning. Type of Study: Bedside Swallow Evaluation Previous Swallow Assessment: N/A Diet Prior to this Study: NPO Temperature Spikes Noted: No Respiratory Status: Nasal cannula History of Recent Intubation: Yes Length of Intubations (days): 5 days Date extubated: 01/13/19 Behavior/Cognition: Alert;Requires cueing;Cooperative;Confused;Lethargic/Drowsy;Distractible Oral Cavity Assessment: Within Functional Limits Oral Care Completed by SLP: Yes Oral Cavity - Dentition: Missing dentition Self-Feeding Abilities: Total assist Patient Positioning: Upright in bed Baseline Vocal Quality: Low vocal intensity;Hoarse Volitional Cough: Weak(weak and hoarse-souding) Volitional Swallow: Unable to elicit    Oral/Motor/Sensory Function Overall Oral Motor/Sensory Function: Other (comment)(difficult to differentiate between cognitive delays vs oral motor impairment)   Ice Chips Ice chips: Impaired Oral Phase Impairments: Poor awareness of bolus;Impaired mastication Oral Phase Functional Implications: Prolonged oral transit;Oral holding Pharyngeal Phase Impairments: Suspected delayed Swallow;Throat Clearing - Delayed   Thin Liquid Thin Liquid: Impaired Presentation: Straw Pharyngeal  Phase Impairments: Throat Clearing - Delayed;Decreased hyoid-laryngeal movement Other Comments: mildly decreased pharyngeal contraction and laryngeal elevation    Nectar Thick     Honey Thick     Puree Puree: Impaired Presentation: Spoon Oral Phase Impairments: Reduced lingual movement/coordination Oral Phase Functional Implications: Prolonged oral transit   Solid     Solid: Not tested      Jacob Joseph 01/14/2019,4:22 PM    [name], MA, CCC-SLP Speech Therapy [facility] Acute Rehab Pager: [phone]

## 2019-01-14 NOTE — Progress Notes (Signed)
SLP Cancellation Note  Patient Details Name: Tanuj Mullens MRN: [id number] DOB: [birth year]   Cancelled treatment:       Reason Eval/Treat Not Completed: Medical issues which prohibited therapy. Patient remains on Bipap. Will continue to f/u, possibly later this afternoon if patient off Bipap, alert, and calm.   Gabriel Rainwater MA, CCC-SLP   Chyler Creely Meryl 01/14/2019, 8:59 AM

## 2019-01-15 LAB — CBC
HCT: 29.3 % — ABNORMAL LOW (ref 39.0–52.0)
Hemoglobin: 9.4 g/dL — ABNORMAL LOW (ref 13.0–17.0)
MCH: 27.4 pg (ref 26.0–34.0)
MCHC: 32.1 g/dL (ref 30.0–36.0)
MCV: 85.4 fL (ref 80.0–100.0)
Platelets: 195 10*3/uL (ref 150–400)
RBC: 3.43 MIL/uL — ABNORMAL LOW (ref 4.22–5.81)
RDW: 14.2 % (ref 11.5–15.5)
WBC: 9.9 10*3/uL (ref 4.0–10.5)
nRBC: 0 % (ref 0.0–0.2)

## 2019-01-15 LAB — BASIC METABOLIC PANEL
Anion gap: 7 (ref 5–15)
Anion gap: 8 (ref 5–15)
BUN: 21 mg/dL (ref 8–23)
BUN: 17 mg/dL (ref 8–23)
CO2: 19 mmol/L — ABNORMAL LOW (ref 22–32)
CO2: 20 mmol/L — ABNORMAL LOW (ref 22–32)
Calcium: 8.9 mg/dL (ref 8.9–10.3)
Calcium: 8.9 mg/dL (ref 8.9–10.3)
Chloride: 111 mmol/L (ref 98–111)
Chloride: 112 mmol/L — ABNORMAL HIGH (ref 98–111)
Creatinine, Ser: 0.95 mg/dL (ref 0.61–1.24)
Creatinine, Ser: 1.02 mg/dL (ref 0.61–1.24)
GFR calc Af Amer: >60 mL/min (ref >60)
GFR calc Af Amer: >60 mL/min (ref >60)
GFR calc non Af Amer: >60 mL/min (ref >60)
GFR calc non Af Amer: >60 mL/min (ref >60)
Glucose, Bld: 123 mg/dL — ABNORMAL HIGH (ref 70–99)
Glucose, Bld: 120 mg/dL — ABNORMAL HIGH (ref 70–99)
Potassium: 2.9 mmol/L — ABNORMAL LOW (ref 3.5–5.1)
Potassium: 3.1 mmol/L — ABNORMAL LOW (ref 3.5–5.1)
Sodium: 137 mmol/L (ref 135–145)
Sodium: 140 mmol/L (ref 135–145)

## 2019-01-15 LAB — GLUCOSE, CAPILLARY
Glucose-Capillary: 103 mg/dL — ABNORMAL HIGH (ref 70–99)
Glucose-Capillary: 107 mg/dL — ABNORMAL HIGH (ref 70–99)
Glucose-Capillary: 112 mg/dL — ABNORMAL HIGH (ref 70–99)
Glucose-Capillary: 114 mg/dL — ABNORMAL HIGH (ref 70–99)
Glucose-Capillary: 114 mg/dL — ABNORMAL HIGH (ref 70–99)
Glucose-Capillary: 114 mg/dL — ABNORMAL HIGH (ref 70–99)
Glucose-Capillary: 123 mg/dL — ABNORMAL HIGH (ref 70–99)

## 2019-01-15 LAB — MAGNESIUM: Magnesium: 1.9 mg/dL (ref 1.7–2.4)

## 2019-01-15 MED ORDER — POTASSIUM CHLORIDE 10 MEQ/100ML IV SOLN
10.0000 meq | INTRAVENOUS | Status: AC
  Administered 2019-01-15 (×8): 10 meq via INTRAVENOUS
  Filled 2019-01-15 (×8): qty 100

## 2019-01-15 MED ORDER — SODIUM CHLORIDE 0.9% FLUSH: 10.0000 mL | INTRAVENOUS | Status: DC | PRN

## 2019-01-15 MED ORDER — QUETIAPINE FUMARATE 400 MG PO TABS
400.0000 mg | ORAL_TABLET | Freq: Two times a day (BID) | ORAL | Status: DC
  Administered 2019-01-15 – 2019-01-23 (×17): 400 mg via ORAL
  Filled 2019-01-15 (×19): qty 1

## 2019-01-15 MED ORDER — PREDNISONE 5 MG PO TABS
5.0000 mg | ORAL_TABLET | Freq: Every day | ORAL | Status: DC
  Administered 2019-01-15 – 2019-01-20 (×6): 5 mg via ORAL
  Filled 2019-01-15 (×6): qty 1

## 2019-01-15 MED ORDER — POTASSIUM CHLORIDE 10 MEQ/100ML IV SOLN
10.0000 meq | INTRAVENOUS | Status: AC
  Administered 2019-01-15 – 2019-01-16 (×6): 10 meq via INTRAVENOUS
  Filled 2019-01-15 (×6): qty 100

## 2019-01-15 NOTE — Progress Notes (Signed)
NAME:  Jacob Joseph, MRN:  [id number], DOB:  [birth year], LOS: 5 ADMISSION DATE:  01/10/2019, CONSULTATION DATE: 6/23 REFERRING MD: Dr. Betsey Holiday EDP, CHIEF COMPLAINT: Angioedema  Brief History   63 year old male presented to the emergency department with tongue swelling shortly after starting on lisinopril.  Nasally intubated in the emergency department transferred to [facility].   Past Medical History    significant for HIV, metastatic prostate cancer, COPD, diabetes mellitus, hepatitis C, PE on anticoagulation, and hypertension.  Prostate cancer is metastatic to multiple sites including the thoracic spine.  He is currently managed on palliative Zytiga and is status post radiation therapy to the spine.  He has been on anticoagulation since 2017 after a second unprovoked PE.    Significant Hospital Events   6/23 admitted. Received 2 FFP and started on benadryl, pepcid, solumedrol for angioedema  6/24 remains nasally intubated, remains hemodynamically stable and sedated  6/25 increased oxygen requirement overnight. Tongue swelling is slightly improving  6/26 severe agitation post extubation requiring Precedex, Haldol and Geodon, & BiPAP 6/27 swallow evaluation >> resume Seroquel, apixaban  Consults:    Procedures:  NTT (right nare) 6/23> 6/26  Significant Diagnostic Tests:    Micro Data:  6/23 SARS CoV2 negative   Antimicrobials:     Interim history/subjective:   Back on Precedex overnight agitation after receiving 2 doses of Haldol Swallow evaluation noted Afebrile    Objective   Blood pressure 139/86, pulse 75, temperature 97.7 F (36.5 C), temperature source Axillary, resp. rate 20, height 5\' 9"  (1.753 m), weight 102 kg, SpO2 100 %.    FiO2 (%):  [99 %] 99 %   Intake/Output Summary (Last 24 hours) at 01/15/2019 0842 Last data filed at 01/15/2019 0800 Gross per 24 hour  Intake 1715.74 ml  Output 4850 ml  Net -3134.26 ml   Filed Weights   01/13/19 0500  01/14/19 0400 01/15/19 0414  Weight: 104.4 kg 104.2 kg 102 kg    Examination: General: Obese adult male, calm on Precedex HEENT: No pallor, no icterus, full facemask Lungs: CTA, symmetrical chest expansion Cardiovascular: RRR s1s2 no rgm. No JVD Abdomen: Nontender, soft, hypoactive bowel sounds  Extremities: RUE edema  BLE without edema. No cyanosis no clubbing  Neuro: Follows commands and able to engage in conversation Skin: clean, dry, warm without rash    Resolved Hospital Problem list     Assessment & Plan:   Acute respiratory failure in setting of airway obstruction/angioedema requiring mechanical ventilation Angioedema: likely secondary to ACE-I.  -s/p 2 FFP 6/23 -Lisinopril is discontinued  -C3 160, C4 32 (both WNL)  -Tongue swelling decreased  P -Continue pepcid, dc benadryl -DC Solu-Medrol 40 daily, back to 5 mg of prednisone that he was on at home - Do not resume lisinopril    Acute encephalopathy- Use Haldol 5 every 6 hours as needed since QT is borderline prolonged Hopefully taper Precedex to off  Increase Seroquel to 400 twice daily and hopefully this should help  COPD without acute exacerbation - Duoneb, pulmicort  - SpO2 goal 88-92%  DM2 -home metformin, holding  P -Continue SSI   Hx PE 2017 unprovoked now on lifelong AC -Resume apixaban full dose 5 mg twice daily since he is not on Genvoya anymore, which required reduced doses  Hx prostate cancer with metastases to thoracic spine  -Resume daily palliative Zytiga at some point  HIV Hepatitis C -Resume home Biktarvy  - CD4 count 104 on 6/23    Summary-much improved overall  and hopefully Precedex can be tapered off as his agitation improves and he can transition out of ICU, triad to assume care   Best practice:  Diet: NPO Pain/Anxiety/Delirium protocol (if indicated): DC  VAP protocol (if indicated): Yes DVT prophylaxis: apixaban GI prophylaxis: PPI Glucose control: SSI Mobility:  BR Code Status: FULL Family Communication:  6/26 wife  Disposition: ICU     Kara Mead MD. Shade Flood. [place] [facility] Pager [phone] If no response call [phone]    01/15/2019, 8:42 AM

## 2019-01-15 NOTE — Progress Notes (Signed)
Speech therapy in room working with pt

## 2019-01-15 NOTE — Progress Notes (Signed)
  Speech Language Pathology Treatment: Dysphagia  Patient Details Name: Jacob Joseph MRN: [id number] DOB: [birth year] Today's Date: 01/15/2019 Time: 1660-6301 SLP Time Calculation (min) (ACUTE ONLY): 9 min  Assessment / Plan / Recommendation Clinical Impression  RN reported no pt oral response with attempt to give meds recently.  She reduced sedative mediations.  Pt moderately alert and able to follow commands for OME, but appeared drowsy.  Low intensity vocal quality with congested, wet vocal quality noted intermittently.  With ice chips, there was delayed and prolonged oral response with pt indicated he had swallowed ice chip although it remained on his tongue.  With small cup sips of water, there was intermittent congested cough; sitter reported that this is consistent with baseline since receipt of breathing treatment.  Pt exhibited good oral clearance of puree, but intermittent cough continued.  Recommend pt remain NPO at this time.  Consider alternate means of nutrition, hydration, and medication if pt remains unable to maintain alertness.    HPI HPI: Patient is a 63 y.o. male who presented to the emergency department with tongue swelling shortly after starting on lisinopril. Nasally intubated in the emergency department transferred to [facility]. Intubated 6/22-6/26.      SLP Plan  Continue with current plan of care       Recommendations  Diet recommendations: NPO Medication Administration: Whole meds with puree                Oral Care Recommendations: Oral care QID SLP Visit Diagnosis: Dysphagia, oropharyngeal phase (R13.12) Plan: Continue with current plan of care       [place], [place], [place] Office: [phone]; Pager (6/28): [phone] 01/15/2019, 11:26 AM

## 2019-01-15 NOTE — Progress Notes (Signed)
Nurse attempted to call pt's wife for the 2nd time with no answer

## 2019-01-15 NOTE — Progress Notes (Signed)
[facility] Progress Note Patient Name: Jacob Joseph DOB: [birth year] MRN: [id number]   Date of Service  01/15/2019  HPI/Events of Note  NPO. Already got elequis in day time. Asking to drop NG or not. S/p extubation on 26 th/angioedema. K low, got 80 meq Kcl, level still low at 3.1, Cr 1.  eICU Interventions  - keep NPO, no NG tube - asp precautions - Get mag level. - Kcl 10 meq q1 hr x 6 doses. Follow labs in am.  Discussed with [name], RN.      Intervention Category Intermediate Interventions: Electrolyte abnormality - evaluation and management;Other:  [name] 01/15/2019, 9:39 PM

## 2019-01-15 NOTE — Progress Notes (Signed)
Patient continues to be managed by PCCM while in ICU and on precedex. [facility] to assume care as he is transitioned off.  Jonnie Finner, DO

## 2019-01-15 NOTE — Progress Notes (Signed)
Warm blanket given to pt upon request

## 2019-01-15 NOTE — Progress Notes (Signed)
Dr in room examining pt

## 2019-01-15 NOTE — Progress Notes (Addendum)
Attempted to give patient small amount of his 2200 medications crushed in apple sauce to assess how he will do swallowing them, but was too agitated and drowsy to swallow.

## 2019-01-15 NOTE — Progress Notes (Signed)
Respiratory in room giving pt breathing treatment

## 2019-01-15 NOTE — Progress Notes (Signed)
[facility] ADULT ICU REPLACEMENT PROTOCOL FOR AM LAB REPLACEMENT ONLY  The patient does apply for the [facility] Adult ICU Electrolyte Replacment Protocol based on the criteria listed below:   1. Is GFR >/= 40 ml/min? Yes.    Patient's GFR today is >60 2. Is urine output >/= 0.5 ml/kg/hr for the last 6 hours? Yes.   Patient's UOP is 0.6 ml/kg/hr 3. Is BUN < 60 mg/dL? Yes.    Patient's BUN today is 21 4. Abnormal electrolyte(s): k 2.9 5. Ordered repletion with: protocol 6. If a panic level lab has been reported, has the CCM MD in charge been notified? No..   Physician:    Ronda Fairly A 01/15/2019 6:12 AM

## 2019-01-16 ENCOUNTER — Inpatient Hospital Stay (HOSPITAL_COMMUNITY): Payer: 59

## 2019-01-16 DIAGNOSIS — J449 Chronic obstructive pulmonary disease, unspecified: Secondary | ICD-10-CM

## 2019-01-16 DIAGNOSIS — I82409 Acute embolism and thrombosis of unspecified deep veins of unspecified lower extremity: Secondary | ICD-10-CM

## 2019-01-16 DIAGNOSIS — B20 Human immunodeficiency virus [HIV] disease: Secondary | ICD-10-CM

## 2019-01-16 DIAGNOSIS — I10 Essential (primary) hypertension: Secondary | ICD-10-CM

## 2019-01-16 LAB — BASIC METABOLIC PANEL
Anion gap: 11 (ref 5–15)
BUN: 16 mg/dL (ref 8–23)
CO2: 20 mmol/L — ABNORMAL LOW (ref 22–32)
Calcium: 8.9 mg/dL (ref 8.9–10.3)
Chloride: 109 mmol/L (ref 98–111)
Creatinine, Ser: 0.95 mg/dL (ref 0.61–1.24)
GFR calc Af Amer: >60 mL/min (ref >60)
GFR calc non Af Amer: >60 mL/min (ref >60)
Glucose, Bld: 120 mg/dL — ABNORMAL HIGH (ref 70–99)
Potassium: 3.5 mmol/L (ref 3.5–5.1)
Sodium: 140 mmol/L (ref 135–145)

## 2019-01-16 LAB — GLUCOSE, CAPILLARY
Glucose-Capillary: 122 mg/dL — ABNORMAL HIGH (ref 70–99)
Glucose-Capillary: 108 mg/dL — ABNORMAL HIGH (ref 70–99)
Glucose-Capillary: 133 mg/dL — ABNORMAL HIGH (ref 70–99)
Glucose-Capillary: 81 mg/dL (ref 70–99)
Glucose-Capillary: 112 mg/dL — ABNORMAL HIGH (ref 70–99)

## 2019-01-16 MED ORDER — IPRATROPIUM-ALBUTEROL 0.5-2.5 (3) MG/3ML IN SOLN
3.0000 mL | Freq: Two times a day (BID) | RESPIRATORY_TRACT | Status: DC
  Administered 2019-01-16 – 2019-01-21 (×11): 3 mL via RESPIRATORY_TRACT
  Filled 2019-01-16 (×12): qty 3

## 2019-01-16 MED ORDER — HYDRALAZINE HCL 20 MG/ML IJ SOLN
10.0000 mg | INTRAMUSCULAR | Status: DC | PRN
  Filled 2019-01-16: qty 1

## 2019-01-16 MED ORDER — POTASSIUM CHLORIDE 10 MEQ/100ML IV SOLN
10.0000 meq | INTRAVENOUS | Status: AC
  Administered 2019-01-16 (×2): 10 meq via INTRAVENOUS
  Filled 2019-01-16 (×2): qty 100

## 2019-01-16 MED ORDER — PHENOL 1.4 % MT LIQD
1.0000 | OROMUCOSAL | Status: DC | PRN
  Administered 2019-01-16: 1 via OROMUCOSAL
  Filled 2019-01-16: qty 177

## 2019-01-16 MED ORDER — METOPROLOL TARTRATE 25 MG PO TABS
25.0000 mg | ORAL_TABLET | Freq: Two times a day (BID) | ORAL | Status: DC
  Administered 2019-01-16 (×2): 25 mg via ORAL
  Filled 2019-01-16: qty 1
  Filled 2019-01-16: qty 2

## 2019-01-16 NOTE — Progress Notes (Signed)
Swelling to pt's L upper arm, informed Dr. Marylyn Ishihara, T.O for doppler rule out DVT.

## 2019-01-16 NOTE — Progress Notes (Signed)
NEW ADMISSION NOTE New Admission Note:   Arrival Method: bed from 41M Mental Orientation: alert and oriented x3  Telemetry: box 11. NSR Assessment: Completed Skin: intact. Foam on sacrum d/t DTI on sacrum  IV: RFA NSL Pain: 0  Tubes: condom catheter  Safety Measures: Safety Fall Prevention Plan has been given, discussed and signed Admission: Completed [facility] Orientation: Patient has been orientated to the room, unit and staff.  Family: NA  Orders have been reviewed and implemented. Will continue to monitor the patient. Call light has been placed within reach and bed alarm has been activated.   Baldo Ash, RN

## 2019-01-16 NOTE — Progress Notes (Signed)
Assisted tele visit to patient with wife.  Jacob Joseph, Janace Hoard, RN

## 2019-01-16 NOTE — Progress Notes (Signed)
Patient found down on his right side by nurse (me). Patient stated he was trying to reach for his drink that he spilled on the ground. Patient did not hit his head, and no injuries were observed. Patient stated nothing was hurting him. MD made aware. Post vitals were stable after the fall. Patient is now back in bed in the lowest position, and bed alarm is on. High fall risk band has been put on the patient.   Farley LyRN

## 2019-01-16 NOTE — Progress Notes (Signed)
Left upper extremity venous duplex completed. Refer to "CV Proc" under chart review to view preliminary results.  01/16/2019 4:21 PM Maudry Mayhew, MHA, RVT, RDCS, RDMS

## 2019-01-16 NOTE — Progress Notes (Signed)
[name]  PROGRESS NOTE    Jacob Joseph  KGM:[id number] DOB: January 17, 1956 DOA: 01/10/2019 PCP: Jacob Seat, MD   Brief Narrative:   63 year old male presented to the emergency department with tongue swelling shortly after starting on lisinopril.  Nasally intubated in the emergency department transferred to [facility].   Assessment & Plan:   Active Problems:   HIV disease ([place])   Hypertension   COPD, moderate (HCC)   Angioedema   Pressure injury of skin   Acute respiratory failure in setting of airway obstruction/angioedema requiring mechanical ventilation     - angioedema likely secondary to ACE-I.      - s/p 2 FFP 6/23     - Lisinopril added as allergy      - swelling is resolved     - continue home dose of predinsone  Acute encephalopathy     - Use Haldol 5 every 6 hours as needed since QT is borderline prolonged     - Seroquel 400 twice daily     - now off precedex and A&O x 2, cooperative  COPD without acute exacerbation     - Duoneb, pulmicort      - SpO2 goal 88-92%  DM2     - hold home metformin     - Continue SSI   Hx PE 2017 unprovoked now on lifelong AC     - apixaban full dose 5 mg twice daily since he is not on Genvoya anymore, which required reduced doses  Hx prostate cancer with metastases to thoracic spine      - Resume daily palliative Zytiga at some point  HIV Hepatitis C     - Resume home Biktarvy      - CD4 count 104 on 6/23   HTN     - start metoprolol  Let's make sure he's stable through this morning and then can dwngrade to med tele.    DVT prophylaxis: eliquis Code Status: FULL   Disposition Plan: TBD   Consultants:   PCCM   Subjective: "I think I am better."  Objective: Vitals:   01/16/19 0800 01/16/19 0843 01/16/19 0900 01/16/19 1000  BP: (!) 163/110 (!) 163/110 (!) 188/102 (!) 170/105  Pulse: 85 (!) 102 (!) 102 (!) 119  Resp: (!) 27  (!) 23 19  Temp:      TempSrc:      SpO2: 100% 99% 96% 95%  Weight:       Height:        Intake/Output Summary (Last 24 hours) at 01/16/2019 1048 Last data filed at 01/16/2019 0800 Gross per 24 hour  Intake 1977.43 ml  Output 3390 ml  Net -1412.57 ml   Filed Weights   01/14/19 0400 01/15/19 0414 01/16/19 0326  Weight: 104.2 kg 102 kg 102 kg    Examination:  General: 63 y.o. male resting in bed in NAD Cardiovascular: RRR, +S1, S2, no m/g/r, equal pulses throughout Respiratory: CTABL, no w/r/r, normal WOB GI: BS+, NDNT, no masses noted, no organomegaly noted MSK: No e/c/c Skin: No rashes, bruises, ulcerations noted Neuro: A&O person, place; no focal deficits Psyc: Appropriate interaction and affect, calm/cooperative   Data Reviewed: I have personally reviewed following labs and imaging studies.  CBC: Recent Labs  Lab 01/10/19 0101  01/10/19 2323 01/12/19 0721 01/13/19 0420 01/13/19 1534 01/14/19 0330 01/15/19 0405  WBC 15.4*  --  14.4* 12.2* 12.1*  --  12.3* 9.9  NEUTROABS 14.0*  --   --   --   --   --   --   --  HGB 10.6*   < > 9.8* 9.9* 10.2* 9.9* 9.5* 9.4*  HCT 34.2*   < > 30.6* 31.6* 33.3* 29.0* 30.5* 29.3*  MCV 89.5  --  87.2 88.3 88.8  --  87.1 85.4  PLT 217  --  208 225 224  --  189 195   < > = values in this interval not displayed.   Basic Metabolic Panel: Recent Labs  Lab 01/10/19 2323  01/13/19 0420 01/13/19 1534 01/14/19 0330 01/15/19 0405 01/15/19 2054 01/16/19 0242  NA 139   < > 139 141 139 137 140 140  K 4.2   < > 4.8 3.7 3.8 2.9* 3.1* 3.5  CL 109   < > 111  --  111 111 112* 109  CO2 21*   < > 19*  --  20* 19* 20* 20*  GLUCOSE 140*   < > 138*  --  164* 123* 120* 120*  BUN 16   < > 17  --  16 21 17 16   CREATININE 1.11   < > 1.06  --  0.89 0.95 1.02 0.95  CALCIUM 9.1   < > 8.7*  --  8.9 8.9 8.9 8.9  MG 2.0  --   --   --   --   --  1.9  --   PHOS 4.3  --   --   --   --   --   --   --    < > = values in this interval not displayed.   GFR: Estimated Creatinine Clearance: 96.1 mL/min (by C-G formula based on SCr  of 0.95 mg/dL). Liver Function Tests: No results for input(s): AST, ALT, ALKPHOS, BILITOT, PROT, ALBUMIN in the last 168 hours. No results for input(s): LIPASE, AMYLASE in the last 168 hours. No results for input(s): AMMONIA in the last 168 hours. Coagulation Profile: Recent Labs  Lab 01/10/19 0644  INR 1.1   Cardiac Enzymes: No results for input(s): CKTOTAL, CKMB, CKMBINDEX, TROPONINI in the last 168 hours. BNP (last 3 results) No results for input(s): PROBNP in the last 8760 hours. HbA1C: No results for input(s): HGBA1C in the last 72 hours. CBG: Recent Labs  Lab 01/15/19 1529 01/15/19 1941 01/15/19 2334 01/16/19 0408 01/16/19 0729  GLUCAP 114* 114* 123* 122* 108*   Lipid Profile: No results for input(s): CHOL, HDL, LDLCALC, TRIG, CHOLHDL, LDLDIRECT in the last 72 hours. Thyroid Function Tests: No results for input(s): TSH, T4TOTAL, FREET4, T3FREE, THYROIDAB in the last 72 hours. Anemia Panel: No results for input(s): VITAMINB12, FOLATE, FERRITIN, TIBC, IRON, RETICCTPCT in the last 72 hours. Sepsis Labs: No results for input(s): PROCALCITON, LATICACIDVEN in the last 168 hours.  Recent Results (from the past 240 hour(s))  SARS Coronavirus 2 (CEPHEID - Performed in [facility] hospital lab), Hosp Order     Status: None   Collection Time: 01/10/19  1:44 AM   Specimen: Nasopharyngeal Swab  Result Value Ref Range Status   SARS Coronavirus 2 NEGATIVE NEGATIVE Final    Comment: (NOTE) If result is NEGATIVE SARS-CoV-2 target nucleic acids are NOT DETECTED. The SARS-CoV-2 RNA is generally detectable in upper and lower  respiratory specimens during the acute phase of infection. The lowest  concentration of SARS-CoV-2 viral copies this assay can detect is 250  copies / mL. A negative result does not preclude SARS-CoV-2 infection  and should not be used as the sole basis for treatment or other  patient management decisions.  A negative result may occur with  improper specimen  collection / handling, submission of specimen other  than nasopharyngeal swab, presence of viral mutation(s) within the  areas targeted by this assay, and inadequate number of viral copies  (<250 copies / mL). A negative result must be combined with clinical  observations, patient history, and epidemiological information. If result is POSITIVE SARS-CoV-2 target nucleic acids are DETECTED. The SARS-CoV-2 RNA is generally detectable in upper and lower  respiratory specimens dur ing the acute phase of infection.  Positive  results are indicative of active infection with SARS-CoV-2.  Clinical  correlation with patient history and other diagnostic information is  necessary to determine patient infection status.  Positive results do  not rule out bacterial infection or co-infection with other viruses. If result is PRESUMPTIVE POSTIVE SARS-CoV-2 nucleic acids MAY BE PRESENT.   A presumptive positive result was obtained on the submitted specimen  and confirmed on repeat testing.  While 2019 novel coronavirus  (SARS-CoV-2) nucleic acids may be present in the submitted sample  additional confirmatory testing may be necessary for epidemiological  and / or clinical management purposes  to differentiate between  SARS-CoV-2 and other Sarbecovirus currently known to infect humans.  If clinically indicated additional testing with an alternate test  methodology [phone]) is advised. The SARS-CoV-2 RNA is generally  detectable in upper and lower respiratory sp ecimens during the acute  phase of infection. The expected result is Negative. Fact Sheet for Patients:  StrictlyIdeas.no Fact Sheet for Healthcare Providers: BankingDealers.co.za This test is not yet approved or cleared by the [place] FDA and has been authorized for detection and/or diagnosis of SARS-CoV-2 by FDA under an Emergency Use Authorization (EUA).  This EUA will remain in effect  (meaning this test can be used) for the duration of the COVID-19 declaration under Section 564(b)(1) of the Act, 21 U.S.C. section 360bbb-3(b)(1), unless the authorization is terminated or revoked sooner. Performed at [facility], [address], [place], [place] [address]   MRSA PCR Screening     Status: None   Collection Time: 01/10/19  6:31 AM   Specimen: Nasopharyngeal  Result Value Ref Range Status   MRSA by PCR NEGATIVE NEGATIVE Final    Comment:        The GeneXpert MRSA Assay (FDA approved for NASAL specimens only), is one component of a comprehensive MRSA colonization surveillance program. It is not intended to diagnose MRSA infection nor to guide or monitor treatment for MRSA infections. Performed at [facility], [place] [address], [place], [place] [address]          Radiology Studies: No results found.      Scheduled Meds: . apixaban  5 mg Oral BID  . bictegravir-emtricitabine-tenofovir AF  1 tablet Oral Daily  . budesonide (PULMICORT) nebulizer solution  0.25 mg Nebulization Daily  . Chlorhexidine Gluconate Cloth  6 each Topical Daily  . gabapentin  600 mg Oral BID  . insulin aspart  0-20 Units Subcutaneous Q4H  . ipratropium-albuterol  3 mL Nebulization Q6H  . mouth rinse  15 mL Mouth Rinse BID  . metoprolol tartrate  25 mg Oral BID  . predniSONE  5 mg Oral Q breakfast  . QUEtiapine  400 mg Oral BID   Continuous Infusions: . sodium chloride 50 mL/hr at 01/16/19 0700     LOS: 6 days    Time spent: 35 minutes spent in the coordination of care today.    Jonnie Finner, DO [facility] Pager [phone]  If 7PM-7AM,  please contact night-coverage [email or web address] Password [email or web address] 01/16/2019, 10:48 AM

## 2019-01-16 NOTE — Progress Notes (Signed)
  Speech Language Pathology Treatment: Dysphagia  Patient Details Name: Jacob Joseph MRN: [id number] DOB: February 14, 1956 Today's Date: 01/16/2019 Time: 7591-6384 SLP Time Calculation (min) (ACUTE ONLY): 11 min  Assessment / Plan / Recommendation Clinical Impression  Pt fully alert, vocal quality only minimally hoarse. RN reports he did well with sips and bites of puree. SLP reviewed 3 oz water swallow procedure with RN. Pt was able to consume 3 oz of water consecutively without stopping or cough. Also masticated cracker and ate puree without difficulty . Reports his throat swelling feels resolved. Will initiate a regular diet and thin liquids. No SLP f/u needed will sign off.   HPI HPI: Patient is a 63 y.o. male who presented to the emergency department with tongue swelling shortly after starting on lisinopril. Nasally intubated in the emergency department transferred to [facility]. Intubated 6/22-6/26.      SLP Plan  All goals met       Recommendations  Diet recommendations: Regular;Thin liquid Liquids provided via: Cup;Straw Medication Administration: Whole meds with liquid Supervision: Patient able to self feed                Plan: All goals met       GO               Jacob [place], MA [place] Pager [phone] Office [phone]  Lynann Beaver 01/16/2019, 10:03 AM

## 2019-01-16 NOTE — Plan of Care (Signed)
  Problem: Pain Managment: Goal: General experience of comfort will improve Outcome: Progressing   

## 2019-01-16 NOTE — Progress Notes (Signed)
Pt bedside swallow completed, Pt alert and oriented. Pt given ice chips, water, and applesauce. Pt was able to swallow and airway remained clear.

## 2019-01-16 NOTE — Progress Notes (Signed)
[facility] ADULT ICU REPLACEMENT PROTOCOL FOR AM LAB REPLACEMENT ONLY  The patient does apply for the [facility] Adult ICU Electrolyte Replacment Protocol based on the criteria listed below:   1. Is GFR >/= 40 ml/min? Yes.    Patient's GFR today is >60 2. Is urine output >/= 0.5 ml/kg/hr for the last 6 hours? Yes.   Patient's UOP is 0.9 ml/kg/hr 3. Is BUN < 60 mg/dL? Yes.    Patient's BUN today is 16 4. Abnormal electrolyte(s): k 3.5 5. Ordered repletion with: protocol 6. If a panic level lab has been reported, has the CCM MD in charge been notified? No..   Physician:    Ronda Fairly A 01/16/2019 4:56 AM

## 2019-01-17 ENCOUNTER — Other Ambulatory Visit: Payer: Self-pay | Admitting: Oncology

## 2019-01-17 ENCOUNTER — Other Ambulatory Visit: Payer: Self-pay

## 2019-01-17 DIAGNOSIS — C61 Malignant neoplasm of prostate: Secondary | ICD-10-CM

## 2019-01-17 LAB — BASIC METABOLIC PANEL
Anion gap: 9 (ref 5–15)
BUN: 14 mg/dL (ref 8–23)
CO2: 22 mmol/L (ref 22–32)
Calcium: 9.1 mg/dL (ref 8.9–10.3)
Chloride: 110 mmol/L (ref 98–111)
Creatinine, Ser: 0.99 mg/dL (ref 0.61–1.24)
GFR calc Af Amer: >60 mL/min (ref >60)
GFR calc non Af Amer: >60 mL/min (ref >60)
Glucose, Bld: 104 mg/dL — ABNORMAL HIGH (ref 70–99)
Potassium: 3.2 mmol/L — ABNORMAL LOW (ref 3.5–5.1)
Sodium: 141 mmol/L (ref 135–145)

## 2019-01-17 LAB — CBC WITH DIFFERENTIAL/PLATELET
Abs Immature Granulocytes: 0.07 10*3/uL (ref 0.00–0.07)
Basophils Absolute: 0 10*3/uL (ref 0.0–0.1)
Basophils Relative: 0 %
Eosinophils Absolute: 0.3 10*3/uL (ref 0.0–0.5)
Eosinophils Relative: 4 %
HCT: 32.7 % — ABNORMAL LOW (ref 39.0–52.0)
Hemoglobin: 10.4 g/dL — ABNORMAL LOW (ref 13.0–17.0)
Immature Granulocytes: 1 %
Lymphocytes Relative: 16 %
Lymphs Abs: 1.5 10*3/uL (ref 0.7–4.0)
MCH: 27.3 pg (ref 26.0–34.0)
MCHC: 31.8 g/dL (ref 30.0–36.0)
MCV: 85.8 fL (ref 80.0–100.0)
Monocytes Absolute: 1.3 10*3/uL — ABNORMAL HIGH (ref 0.1–1.0)
Monocytes Relative: 14 %
Neutro Abs: 5.8 10*3/uL (ref 1.7–7.7)
Neutrophils Relative %: 65 %
Platelets: 200 10*3/uL (ref 150–400)
RBC: 3.81 MIL/uL — ABNORMAL LOW (ref 4.22–5.81)
RDW: 14.3 % (ref 11.5–15.5)
WBC: 9 10*3/uL (ref 4.0–10.5)
nRBC: 0 % (ref 0.0–0.2)

## 2019-01-17 LAB — GLUCOSE, CAPILLARY
Glucose-Capillary: 115 mg/dL — ABNORMAL HIGH (ref 70–99)
Glucose-Capillary: 123 mg/dL — ABNORMAL HIGH (ref 70–99)
Glucose-Capillary: 129 mg/dL — ABNORMAL HIGH (ref 70–99)
Glucose-Capillary: 140 mg/dL — ABNORMAL HIGH (ref 70–99)
Glucose-Capillary: 156 mg/dL — ABNORMAL HIGH (ref 70–99)
Glucose-Capillary: 183 mg/dL — ABNORMAL HIGH (ref 70–99)

## 2019-01-17 LAB — MAGNESIUM: Magnesium: 1.9 mg/dL (ref 1.7–2.4)

## 2019-01-17 MED ORDER — METOPROLOL TARTRATE 50 MG PO TABS
50.0000 mg | ORAL_TABLET | Freq: Two times a day (BID) | ORAL | Status: DC
  Administered 2019-01-17 – 2019-01-23 (×13): 50 mg via ORAL
  Filled 2019-01-17 (×13): qty 1

## 2019-01-17 MED ORDER — POTASSIUM CHLORIDE CRYS ER 20 MEQ PO TBCR
40.0000 meq | EXTENDED_RELEASE_TABLET | Freq: Two times a day (BID) | ORAL | Status: DC
  Administered 2019-01-17 – 2019-01-20 (×7): 40 meq via ORAL
  Filled 2019-01-17 (×7): qty 2

## 2019-01-17 NOTE — Progress Notes (Signed)
Occupational Therapy Treatment Patient Details Name: Jacob Joseph MRN: [id number] DOB: [birth year] Today's Date: 01/17/2019    History of present illness 63 year old male presented to the emergency department with tongue swelling shortly after starting on lisinopril.  Nasally intubated in the emergency department transferred to [facility].  Intubated 6/22-6/26; previous CVA    OT comments  Pt demonstrates poor activity tolerance with Lateral lean to the Left in chair requiring pillows for positioning. Pt requires (A) for self feeding due to poor grasp with utensils and R side of mouth pocketing. SLP consult recommended. Pt not wearing dentures for upper plate during session.(pt declined) Recommend SNF for d/c.   Follow Up Recommendations  SNF    Equipment Recommendations  3 in 1 bedside commode    Recommendations for Other Services Speech consult(pocketing food in R side of mouth)    Precautions / Restrictions Precautions Precautions: Fall Precaution Comments: Tachycardic with activity Restrictions Weight Bearing Restrictions: No       Mobility Bed Mobility Overal bed mobility: Needs Assistance Bed Mobility: Supine to Sit     Supine to sit: Mod assist     General bed mobility comments: up in chair on arrival  Transfers Overall transfer level: Needs assistance Equipment used: Rolling walker (2 wheeled) Transfers: Sit to/from Stand Sit to Stand: Mod assist         General transfer comment: pt required extensive help with lunch so transfer not attempted at this time. Pt seeing PT just prior to this session. Pt recommended sit<>Stand only with +1 (A)    Balance Overall balance assessment: Needs assistance Sitting-balance support: Feet supported Sitting balance-Leahy Scale: Fair   Postural control: Left lateral lean   Standing balance-Leahy Scale: Poor                             ADL either performed or assessed with clinical judgement   ADL  Overall ADL's : Needs assistance/impaired Eating/Feeding: Maximal assistance;Sitting Eating/Feeding Details (indicate cue type and reason): dropping fork, unable to cut food, declines to wear top dentures, pocketing food in R side of mouth, unable to sustain shoulder flexion to hold fork to mouth. Pt needs (A) from staff for self feeding. pt also demonstrates fatigue with task and will need (A) to ensure ability to get proper intake.  Grooming: Dance movement psychotherapist;Moderate assistance;Sitting Grooming Details (indicate cue type and reason): spillage all over gown. lacks awareness to wipe face                               General ADL Comments: pt with strong L lateral lean and needed chair elevated x2 pillows behind patient and L lateral side for positioning      Vision   Additional Comments: pt closing eyes 80% of session. pt needs name call to sustain attention to task.    Perception     Praxis      Cognition Arousal/Alertness: Lethargic Behavior During Therapy: WFL for tasks assessed/performed Overall Cognitive Status: Impaired/Different from baseline Area of Impairment: Safety/judgement                         Safety/Judgement: Decreased awareness of deficits;Decreased awareness of safety   Problem Solving: Slow processing General Comments: pt oriented to self and reason for admission. pt with delayed responses and pausing with responses        Exercises  Shoulder Instructions       General Comments Icr HR with activity: O2 sats greater than or equal to 95% on Room Air    Pertinent Vitals/ Pain       Pain Assessment: No/denies pain  Home Living Family/patient expects to be discharged to:: Private residence Living Arrangements: Spouse/significant other Available Help at Discharge: Family;Available 24 hours/day Type of Home: Apartment Home Access: Stairs to enter Entrance Stairs-Number of Steps: 3 Entrance Stairs-Rails: Right;Left;Can reach  both Home Layout: One level     Bathroom Shower/Tub: Teacher, early years/pre: Standard Bathroom Accessibility: Yes How Accessible: Accessible via walker Home Equipment: Cane - single point   Additional Comments: lost most of DME after moving out of last apartment, was thrown away per patient      Prior Functioning/Environment Level of Independence: Independent with assistive device(s)        Comments: Community ambulator with [employer], wife drives   Frequency  Min 2X/week        Progress Toward Goals  OT Goals(current goals can now be found in the care plan section)     Acute Rehab OT Goals Patient Stated Goal: to get more rehab OT Goal Formulation: Patient unable to participate in goal setting Time For Goal Achievement: 01/31/19 Potential to Achieve Goals: Good  Plan      Co-evaluation                 AM-PAC OT "6 Clicks" Daily Activity     Outcome Measure   Help from another person eating meals?: A Lot Help from another person taking care of personal grooming?: A Lot Help from another person toileting, which includes using toliet, bedpan, or urinal?: A Lot Help from another person bathing (including washing, rinsing, drying)?: A Lot Help from another person to put on and taking off regular upper body clothing?: A Lot Help from another person to put on and taking off regular lower body clothing?: Total 6 Click Score: 11    End of Session    OT Visit Diagnosis: Unsteadiness on feet (R26.81);Muscle weakness (generalized) (M62.81)   Activity Tolerance Patient tolerated treatment well   Patient Left in chair;with call bell/phone within reach;with chair alarm set   Nurse Communication Mobility status;Precautions        Time: 3016-0109 OT Time Calculation (min): 21 min  Charges: OT General Charges $OT Visit: 1 Visit OT Evaluation $OT Eval Moderate Complexity: 1 Mod   Jeri Modena, OTR/L  Acute Rehabilitation Services Pager:  [phone] Office: [phone] .    Jeri Modena 01/17/2019, 1:42 PM

## 2019-01-17 NOTE — Discharge Instructions (Signed)

## 2019-01-17 NOTE — Consult Note (Signed)
[facility] Nurse wound consult note Reason for Consult:New consult for sacral wound, but this was observed 01/13/19 and [facility] team was consulted.  Darkened area resolving but central wound over coccyx remains with peeling epithelium.  We are continuing to offload and protect with silicone foam.  Wound type:deep tissue  injury Pressure Injury POA: Yes Measurement: today is 3 cm x 6 cm with 1.5 cm x 1.5 cm peeling epithelium, revealing dark pink moist wound bed.  Wound HUT:MLYY and moist Drainage (amount, consistency, odor) minimal serosanguinous  No odor Periwound:maroon discoloration Dressing procedure/placement/frequency: Continue silicone foam.  Will not follow at this time.  Please re-consult if needed.  Domenic Moras MSN, RN, FNP-BC CWON Wound, Ostomy, Continence Nurse Pager [phone]

## 2019-01-17 NOTE — Progress Notes (Signed)
Jacob Joseph Kitchen  PROGRESS NOTE    Jacob Joseph  TIR:[id number] DOB: [birth year] DOA: 01/10/2019 PCP: Neva Seat, MD   Brief Narrative:   63 year old male presented to the emergency department with tongue swelling shortly after starting on lisinopril. Nasally intubated in the emergency department transferred to [facility].   Assessment & Plan:   Active Problems:   HIV disease ([place])   Hypertension   COPD, moderate (HCC)   Angioedema   Pressure injury of skin   Acute respiratory failure in setting of airway obstruction/angioedema requiring mechanical ventilation     - angioedema likely secondary to ACE-I.      - s/p 2 FFP 6/23     - Lisinopril added as allergy      - swelling is resolved     - continue home dose of predinsone  Acute encephalopathy     - Use Haldol 5 every 6 hours as needed since QT is borderline prolonged     - Seroquel 400 twice daily     - now off precedex and A&O x 2, cooperative  COPD without acute exacerbation     - Duoneb, pulmicort      - SpO2 goal 88-92%  DM2     - hold home metformin     - Continue SSI   Hx PE 2017 unprovoked now on lifelong AC     - apixaban full dose 5 mg twice daily since he is not on Genvoya anymore, which required reduced doses  Hx prostate cancer with metastases to thoracic spine      - Resume daily palliative Zytiga at some point  HIV Hepatitis C     - continue Biktarvy      - CD4 count 104 on 6/23   HTN     - increase metoprolol  Generalized weakness/gait instability     - PT rec SNF     - RW, 3n1     - will speak with SW about options.   Patient had fall ON. Eval'd by PT today. Recs made. Will d/w SWer. DME ordered. Increase metoprolol for BP. Hopeful for d/c in next 24hrs.    DVT prophylaxis: eliquis Code Status: FULL   Disposition Plan: TBD   Subjective: "I think I'm ok."  Objective: Vitals:   01/16/19 2254 01/17/19 0412 01/17/19 0415 01/17/19 0746  BP: (!) 161/101 (!) 160/88    Pulse: (!)  110 (!) 102    Resp: 19 20    Temp:  99.5 F (37.5 C)    TempSrc:  Oral    SpO2: 98% 98%  93%  Weight:   102 kg   Height:        Intake/Output Summary (Last 24 hours) at 01/17/2019 1257 Last data filed at 01/17/2019 1100 Gross per 24 hour  Intake 120 ml  Output 776 ml  Net -656 ml   Filed Weights   01/16/19 0326 01/16/19 2011 01/17/19 0415  Weight: 102 kg 102 kg 102 kg    Examination:  General: 63 y.o. male resting in bed in NAD Cardiovascular: RRR, +S1, S2, no m/g/r, equal pulses throughout Respiratory: CTABL, no w/r/r, normal WOB GI: BS+, NDNT, no masses noted, no organomegaly noted MSK: No e/c/c Skin: No rashes, bruises, ulcerations noted Neuro: A&O person, place; no focal deficits Psyc: Appropriate interaction and affect, calm/cooperative    Data Reviewed: I have personally reviewed following labs and imaging studies.  CBC: Recent Labs  Lab 01/12/19 0721 01/13/19 0420 01/13/19 1534 01/14/19 0330 01/15/19  0405 01/17/19 0558  WBC 12.2* 12.1*  --  12.3* 9.9 9.0  NEUTROABS  --   --   --   --   --  5.8  HGB 9.9* 10.2* 9.9* 9.5* 9.4* 10.4*  HCT 31.6* 33.3* 29.0* 30.5* 29.3* 32.7*  MCV 88.3 88.8  --  87.1 85.4 85.8  PLT 225 224  --  189 195 902   Basic Metabolic Panel: Recent Labs  Lab 01/10/19 2323  01/14/19 0330 01/15/19 0405 01/15/19 2054 01/16/19 0242 01/17/19 0558  NA 139   < > 139 137 140 140 141  K 4.2   < > 3.8 2.9* 3.1* 3.5 3.2*  CL 109   < > 111 111 112* 109 110  CO2 21*   < > 20* 19* 20* 20* 22  GLUCOSE 140*   < > 164* 123* 120* 120* 104*  BUN 16   < > 16 21 17 16 14   CREATININE 1.11   < > 0.89 0.95 1.02 0.95 0.99  CALCIUM 9.1   < > 8.9 8.9 8.9 8.9 9.1  MG 2.0  --   --   --  1.9  --  1.9  PHOS 4.3  --   --   --   --   --   --    < > = values in this interval not displayed.   GFR: Estimated Creatinine Clearance: 92.2 mL/min (by C-G formula based on SCr of 0.99 mg/dL). Liver Function Tests: No results for input(s): AST, ALT, ALKPHOS,  BILITOT, PROT, ALBUMIN in the last 168 hours. No results for input(s): LIPASE, AMYLASE in the last 168 hours. No results for input(s): AMMONIA in the last 168 hours. Coagulation Profile: No results for input(s): INR, PROTIME in the last 168 hours. Cardiac Enzymes: No results for input(s): CKTOTAL, CKMB, CKMBINDEX, TROPONINI in the last 168 hours. BNP (last 3 results) No results for input(s): PROBNP in the last 8760 hours. HbA1C: No results for input(s): HGBA1C in the last 72 hours. CBG: Recent Labs  Lab 01/16/19 2012 01/17/19 0005 01/17/19 0408 01/17/19 0813 01/17/19 1138  GLUCAP 112* 123* 140* 129* 183*   Lipid Profile: No results for input(s): CHOL, HDL, LDLCALC, TRIG, CHOLHDL, LDLDIRECT in the last 72 hours. Thyroid Function Tests: No results for input(s): TSH, T4TOTAL, FREET4, T3FREE, THYROIDAB in the last 72 hours. Anemia Panel: No results for input(s): VITAMINB12, FOLATE, FERRITIN, TIBC, IRON, RETICCTPCT in the last 72 hours. Sepsis Labs: No results for input(s): PROCALCITON, LATICACIDVEN in the last 168 hours.  Recent Results (from the past 240 hour(s))  SARS Coronavirus 2 (CEPHEID - Performed in [place] hospital lab), Hosp Order     Status: None   Collection Time: 01/10/19  1:44 AM   Specimen: Nasopharyngeal Swab  Result Value Ref Range Status   SARS Coronavirus 2 NEGATIVE NEGATIVE Final    Comment: (NOTE) If result is NEGATIVE SARS-CoV-2 target nucleic acids are NOT DETECTED. The SARS-CoV-2 RNA is generally detectable in upper and lower  respiratory specimens during the acute phase of infection. The lowest  concentration of SARS-CoV-2 viral copies this assay can detect is 250  copies / mL. A negative result does not preclude SARS-CoV-2 infection  and should not be used as the sole basis for treatment or other  patient management decisions.  A negative result may occur with  improper specimen collection / handling, submission of specimen other  than  nasopharyngeal swab, presence of viral mutation(s) within the  areas targeted by this assay,  and inadequate number of viral copies  (<250 copies / mL). A negative result must be combined with clinical  observations, patient history, and epidemiological information. If result is POSITIVE SARS-CoV-2 target nucleic acids are DETECTED. The SARS-CoV-2 RNA is generally detectable in upper and lower  respiratory specimens dur ing the acute phase of infection.  Positive  results are indicative of active infection with SARS-CoV-2.  Clinical  correlation with patient history and other diagnostic information is  necessary to determine patient infection status.  Positive results do  not rule out bacterial infection or co-infection with other viruses. If result is PRESUMPTIVE POSTIVE SARS-CoV-2 nucleic acids MAY BE PRESENT.   A presumptive positive result was obtained on the submitted specimen  and confirmed on repeat testing.  While 2019 novel coronavirus  (SARS-CoV-2) nucleic acids may be present in the submitted sample  additional confirmatory testing may be necessary for epidemiological  and / or clinical management purposes  to differentiate between  SARS-CoV-2 and other Sarbecovirus currently known to infect humans.  If clinically indicated additional testing with an alternate test  methodology [phone]) is advised. The SARS-CoV-2 RNA is generally  detectable in upper and lower respiratory sp ecimens during the acute  phase of infection. The expected result is Negative. Fact Sheet for Patients:  StrictlyIdeas.no Fact Sheet for Healthcare Providers: BankingDealers.co.za This test is not yet approved or cleared by the [place] FDA and has been authorized for detection and/or diagnosis of SARS-CoV-2 by FDA under an Emergency Use Authorization (EUA).  This EUA will remain in effect (meaning this test can be used) for the duration of  the COVID-19 declaration under Section 564(b)(1) of the Act, 21 U.S.C. section 360bbb-3(b)(1), unless the authorization is terminated or revoked sooner. Performed at [facility], [address] [place], [place] [address]   MRSA PCR Screening     Status: None   Collection Time: 01/10/19  6:31 AM   Specimen: Nasopharyngeal  Result Value Ref Range Status   MRSA by PCR NEGATIVE NEGATIVE Final    Comment:        The GeneXpert MRSA Assay (FDA approved for NASAL specimens only), is one component of a comprehensive MRSA colonization surveillance program. It is not intended to diagnose MRSA infection nor to guide or monitor treatment for MRSA infections. Performed at [facility], [place] [address], [place], [place] [address]          Radiology Studies: Vas [place] Upper Extremity Venous Duplex  Result Date: 01/16/2019 UPPER VENOUS STUDY  Indications: Swelling Limitations: Body habitus, patient position and bandages. Performing Technologist: Maudry Mayhew MHA, RDMS, RVT, RDCS  Examination Guidelines: A complete evaluation includes B-mode imaging, spectral Doppler, color Doppler, and power Doppler as needed of all accessible portions of each vessel. Bilateral testing is considered an integral part of a complete examination. Limited examinations for reoccurring indications may be performed as noted.  Right Findings: +----------+------------+---------+-----------+----------+--------------+ RIGHT     CompressiblePhasicitySpontaneousProperties   Summary     +----------+------------+---------+-----------+----------+--------------+ Subclavian                                          Not visualized +----------+------------+---------+-----------+----------+--------------+  Left Findings: +----------+------------+---------+-----------+----------+--------------+ LEFT      CompressiblePhasicitySpontaneousProperties   Summary      +----------+------------+---------+-----------+----------+--------------+ IJV           Full       Yes  Yes                             +----------+------------+---------+-----------+----------+--------------+ Subclavian    Full       Yes       Yes                             +----------+------------+---------+-----------+----------+--------------+ Axillary      Full       Yes       Yes                             +----------+------------+---------+-----------+----------+--------------+ Brachial      Full       Yes       Yes                             +----------+------------+---------+-----------+----------+--------------+ Radial        Full                                                 +----------+------------+---------+-----------+----------+--------------+ Ulnar         Full                                                 +----------+------------+---------+-----------+----------+--------------+ Cephalic      Full                                                 +----------+------------+---------+-----------+----------+--------------+ Basilic                                             Not visualized +----------+------------+---------+-----------+----------+--------------+  Summary:  Left: No evidence of deep vein thrombosis in the upper extremity. No evidence of superficial vein thrombosis in the upper extremity.  *See table(s) above for measurements and observations.    Preliminary         Scheduled Meds: . apixaban  5 mg Oral BID  . bictegravir-emtricitabine-tenofovir AF  1 tablet Oral Daily  . budesonide (PULMICORT) nebulizer solution  0.25 mg Nebulization Daily  . Chlorhexidine Gluconate Cloth  6 each Topical Daily  . gabapentin  600 mg Oral BID  . insulin aspart  0-20 Units Subcutaneous Q4H  . ipratropium-albuterol  3 mL Nebulization BID  . mouth rinse  15 mL Mouth Rinse BID  . metoprolol tartrate  50 mg Oral BID  . potassium  chloride  40 mEq Oral BID  . predniSONE  5 mg Oral Q breakfast  . QUEtiapine  400 mg Oral BID   Continuous Infusions:   LOS: 7 days    Time spent: 35 minutes spent in the coordination of care today.    Jonnie Finner, DO [facility] Pager [phone]  If 7PM-7AM, please contact night-coverage [email or web address] Password [email or web address] 01/17/2019, 12:57 PM

## 2019-01-17 NOTE — Progress Notes (Signed)
Physical Therapy Treatment Patient Details Name: Nazaiah Navarrete MRN: [id number] DOB: 1955-09-14 Today's Date: 01/17/2019    History of Present Illness 63 year old male presented to the emergency department with tongue swelling shortly after starting on lisinopril.  Nasally intubated in the emergency department transferred to [facility].  Intubated 6/22-6/26; previous CVA     PT Comments    Continuing work on functional mobility and activity tolerance;  Very motivated to get up and walking; noting a tendency to L lean with transfer and in sitting; HR incr to 1402 with trials at walking   Follow Up Recommendations  SNF;Supervision/Assistance - 24 hour     Equipment Recommendations  Rolling walker with 5" wheels;3in1 (PT)    Recommendations for Other Services       Precautions / Restrictions Precautions Precautions: Fall Precaution Comments: Tachycardic with activity Restrictions Weight Bearing Restrictions: No    Mobility  Bed Mobility Overal bed mobility: Needs Assistance Bed Mobility: Supine to Sit     Supine to sit: Mod assist     General bed mobility comments: Light mod assist to elevate trunk to sit; clear LEs from eOB without assist  Transfers Overall transfer level: Needs assistance Equipment used: Rolling walker (2 wheeled) Transfers: Sit to/from Stand Sit to Stand: Mod assist         General transfer comment: Light mod assist to power up; Heavy dependence on UEs to push up and stabilize; noted L lean/pitch; cues for hand placement, safety, and to control descent to sit  Ambulation/Gait Ambulation/Gait assistance: Mod assist;+2 physical assistance Gait Distance (Feet): 16 Feet(8x2) Assistive device: Rolling walker (2 wheeled) Gait Pattern/deviations: Step-to pattern;Decreased step length - right;Decreased step length - left;Trunk flexed;Drifts right/left(L Lean)     General Gait Details: Mod assist to steady; HR incr to 140s with minimal walking  activity; required one seated rest break   Stairs             Wheelchair Mobility    Modified Rankin (Stroke Patients Only)       Balance Overall balance assessment: Needs assistance Sitting-balance support: Feet supported Sitting balance-Leahy Scale: Fair       Standing balance-Leahy Scale: Poor                              Cognition Arousal/Alertness: Awake/alert Behavior During Therapy: WFL for tasks assessed/performed Overall Cognitive Status: Impaired/Different from baseline Area of Impairment: Safety/judgement                         Safety/Judgement: Decreased awareness of safety;Decreased awareness of deficits   Problem Solving: Requires verbal cues;Requires tactile cues        Exercises      General Comments General comments (skin integrity, edema, etc.): Icr HR with activity: O2 sats greater than or equal to 95% on Room Air      Pertinent Vitals/Pain Pain Assessment: No/denies pain    Home Living                      Prior Function            PT Goals (current goals can now be found in the care plan section) Acute Rehab PT Goals Patient Stated Goal: Wants to walk in the hallway PT Goal Formulation: Patient unable to participate in goal setting Time For Goal Achievement: 01/28/19 Potential to Achieve Goals: Good Progress towards PT goals: Progressing  toward goals    Frequency    Min 3X/week      PT Plan Current plan remains appropriate    Co-evaluation              AM-PAC PT "6 Clicks" Mobility   Outcome Measure  Help needed turning from your back to your side while in a flat bed without using bedrails?: A Little Help needed moving from lying on your back to sitting on the side of a flat bed without using bedrails?: A Little Help needed moving to and from a bed to a chair (including a wheelchair)?: A Lot Help needed standing up from a chair using your arms (e.g., wheelchair or bedside  chair)?: A Lot Help needed to walk in hospital room?: A Lot Help needed climbing 3-5 steps with a railing? : A Lot 6 Click Score: 14    End of Session Equipment Utilized During Treatment: Gait belt Activity Tolerance: Patient tolerated treatment well Patient left: in chair;with call bell/phone within reach;with chair alarm set Nurse Communication: Mobility status PT Visit Diagnosis: Unsteadiness on feet (R26.81);Muscle weakness (generalized) (M62.81)     Time: 4210-3128 PT Time Calculation (min) (ACUTE ONLY): 30 min  Charges:  $Gait Training: 8-22 mins $Therapeutic Activity: 8-22 mins                     Roney Marion, PT  Acute Rehabilitation Services Pager [phone] Office [place] 01/17/2019, 12:35 PM

## 2019-01-17 NOTE — Progress Notes (Signed)
Called patient's wife and updated her on patient care.   Farley Ly RN

## 2019-01-17 NOTE — Plan of Care (Signed)
°  Problem: Coping: °Goal: Level of anxiety will decrease °Outcome: Progressing °  °

## 2019-01-17 NOTE — Care Management Important Message (Signed)
Important Message  Patient Details  Name: Jacob Joseph MRN: [id number] Date of Birth: [birth year]   Medicare Important Message Given:  Yes     Memory Argue 01/17/2019, 5:15 PM

## 2019-01-18 DIAGNOSIS — L89309 Pressure ulcer of unspecified buttock, unspecified stage: Secondary | ICD-10-CM

## 2019-01-18 LAB — GLUCOSE, CAPILLARY
Glucose-Capillary: 102 mg/dL — ABNORMAL HIGH (ref 70–99)
Glucose-Capillary: 106 mg/dL — ABNORMAL HIGH (ref 70–99)
Glucose-Capillary: 113 mg/dL — ABNORMAL HIGH (ref 70–99)
Glucose-Capillary: 116 mg/dL — ABNORMAL HIGH (ref 70–99)
Glucose-Capillary: 137 mg/dL — ABNORMAL HIGH (ref 70–99)
Glucose-Capillary: 158 mg/dL — ABNORMAL HIGH (ref 70–99)

## 2019-01-18 NOTE — NC FL2 (Signed)
[place] LEVEL OF CARE SCREENING TOOL     IDENTIFICATION  Patient Name: Jacob Joseph Birthdate: 03/08/1956 Sex: male Admission Date (Current Location): 01/10/2019  [place] and [place] Number:  [place] [id number] [place] and Address:  [facility] [place], [place] [address], [place], [place] [address]      Provider Number: [id number]  Attending Physician Name and Address:  Tawni Millers,*  Relative Name and Phone Number:  Johnn Krasowski - wife, [phone]    Current Level of Care: Hospital Recommended Level of Care: [place] Prior Approval Number:    Date Approved/Denied:   PASRR Number: (PASRR pending - MUST TM#[id number])  Discharge Plan: SNF    Current Diagnoses: Patient Active Problem List   Diagnosis Date Noted  . Pressure injury of skin 01/13/2019  . Angioedema 01/10/2019  . Leukocytosis 09/01/2018  . Acute respiratory failure with hypoxia ([place]) 08/31/2018  . Severe sepsis ([place]) 08/31/2018  . Closed fracture of proximal end of left humerus with delayed healing 05/26/2018  . Spine metastasis ([place]) 03/24/2018  . Acute deep vein thrombosis (DVT) of right popliteal vein ([place])   . Prostate cancer metastatic to multiple sites [facility])   . Radicular pain of thoracic region   . Bone lesion   . Calculus of gallbladder without cholecystitis without obstruction   . Chronic anticoagulation   . Right upper quadrant pain 02/26/2018  . Abnormal computed tomography of thoracic spine 02/26/2018  . Recurrent falls 11/02/2016  . History of prostate cancer 09/22/2016  . BPH associated with nocturia 06/19/2016  . Substance abuse ([place]) 01/27/2016  . Vitamin D deficiency 12/05/2015  . Hyperlipidemia 01/28/2015  . Cataracts, bilateral 08/29/2014  . Diabetes mellitus treated with oral medication ([place]) 10/19/2013  . COPD, moderate ([place]) 05/31/2013  . Complex Lt Renal Cyst 03/22/2013  . Preventive measure 05/30/2012  .  History of CVA 03/08/2012  . Normocytic anemia 03/08/2012  . CKD (chronic kidney disease) stage 3, GFR 30-59 ml/min (HCC) 03/08/2012  . ERECTILE DYSFUNCTION 04/25/2008  . HIV disease ([place]) 04/27/2006  . Smoking greater than 30 pack years 04/27/2006  . Major depressive disorder, recurrent episode ([place]) 04/27/2006  . Hypertension 04/27/2006  . DEGENERATIVE JOINT DISEASE 04/27/2006  . Chronic Low Back Pain 04/27/2006    Orientation RESPIRATION BLADDER Height & Weight     Self, Time, Place  Normal Incontinent, External catheter(Catheter placed 6/29) Weight: 224 lb 13.9 oz (102 kg) Height:  5\' 9"  (175.3 cm)  BEHAVIORAL SYMPTOMS/MOOD NEUROLOGICAL BOWEL NUTRITION STATUS      Incontinent Diet  AMBULATORY STATUS COMMUNICATION OF NEEDS Skin   Extensive Assist(Mod assist per PT) Verbally Other (Comment)(Pressure injury to medial sacrum, foam dressing)                       Personal Care Assistance Level of Assistance  Bathing, Feeding, Dressing Bathing Assistance: Limited assistance Feeding assistance: Maximum assistance Dressing Assistance: Limited assistance     Functional Limitations Info  Sight, Hearing, Speech Sight Info: Adequate Hearing Info: Adequate Speech Info: Adequate    SPECIAL CARE FACTORS FREQUENCY  PT (By licensed PT), OT (By licensed OT), Speech therapy     PT Frequency: Evaluated in hospital-6/27. PT at SNF - Eval and Treat, minimum of 5 days per week OT Frequency: Evaluated 6/30 in hospital. OT at SNF - Eval and Treat, minimum of 5 days per week     Speech Therapy Frequency: Evaluated 6/28. On 6/29 regualar diet-thin liquids recommended  Contractures Contractures Info: Not present    Additional Factors Info  Code Status, Allergies, Insulin Sliding Scale Code Status Info: Full Allergies Info: Statins   Insulin Sliding Scale Info: 0-20 Units every 4 hours       Current Medications (01/18/2019):  This is the current hospital active medication  list Current Facility-Administered Medications  Medication Dose Route Frequency Provider Last Rate Last Dose  . apixaban (ELIQUIS) tablet 5 mg  5 mg Oral BID Rigoberto Noel, MD   5 mg at 01/18/19 0858  . bictegravir-emtricitabine-tenofovir AF (BIKTARVY) 50-200-25 MG per tablet 1 tablet  1 tablet Oral Daily Rigoberto Noel, MD   1 tablet at 01/18/19 0859  . bisacodyl (DULCOLAX) suppository 10 mg  10 mg Rectal Daily PRN Cristal Generous, NP   10 mg at 01/13/19 0913  . budesonide (PULMICORT) nebulizer solution 0.25 mg  0.25 mg Nebulization Daily Bowser, Laurel Dimmer, NP   0.25 mg at 01/18/19 0912  . Chlorhexidine Gluconate Cloth 2 % PADS 6 each  6 each Topical Daily Marshell Garfinkel, MD   6 each at 01/18/19 0859  . gabapentin (NEURONTIN) capsule 600 mg  600 mg Oral BID Rigoberto Noel, MD   600 mg at 01/18/19 0858  . hydrALAZINE (APRESOLINE) injection 10 mg  10 mg Intravenous Q4H PRN Marylyn Ishihara, Tyrone A, DO      . insulin aspart (novoLOG) injection 0-20 Units  0-20 Units Subcutaneous Q4H Corey Harold, NP   3 Units at 01/18/19 1231  . ipratropium-albuterol (DUONEB) 0.5-2.5 (3) MG/3ML nebulizer solution 3 mL  3 mL Nebulization BID Kyle, Tyrone A, DO   3 mL at 01/18/19 0912  . MEDLINE mouth rinse  15 mL Mouth Rinse BID Rigoberto Noel, MD   15 mL at 01/18/19 0859  . metoprolol tartrate (LOPRESSOR) tablet 50 mg  50 mg Oral BID Marylyn Ishihara, Tyrone A, DO   50 mg at 01/18/19 0859  . phenol (CHLORASEPTIC) mouth spray 1 spray  1 spray Mouth/Throat PRN Gardiner Barefoot, NP   1 spray at 01/16/19 2234  . potassium chloride SA (K-DUR) CR tablet 40 mEq  40 mEq Oral BID Marylyn Ishihara, Tyrone A, DO   40 mEq at 01/18/19 0857  . predniSONE (DELTASONE) tablet 5 mg  5 mg Oral Q breakfast Rigoberto Noel, MD   5 mg at 01/18/19 0858  . QUEtiapine (SEROQUEL) tablet 400 mg  400 mg Oral BID Rigoberto Noel, MD   400 mg at 01/18/19 0858  . sodium chloride flush (NS) 0.9 % injection 10-40 mL  10-40 mL Intracatheter PRN Cherylann Ratel A, DO          Discharge Medications: Please see discharge summary for a list of discharge medications.  Relevant Imaging Results:  Relevant Lab Results:   Additional Information [phone].  Sable Feil, LCSW

## 2019-01-18 NOTE — Progress Notes (Signed)
PROGRESS NOTE    Jacob Joseph  WPY:[id number] DOB: 09-18-1955 DOA: 01/10/2019 PCP: Neva Seat, MD    Brief Narrative:  63 year old male who presented with tongue swelling after starting lisinopril.  He does have significant past medical history for HIV, metastatic prostate cancer, COPD, type 2 diabetes mellitus, hepatitis C, hypertension and history of pulmonary embolism.  He presented to [facility] on June 22 for tongue swelling that occurred about an hour after taking lisinopril.  He received Benadryl, Solu-Medrol and epinephrine on route to the hospital, his symptoms were persistent and worsening, his airway was compromised and he was nasally intubated in the emergency department.  On his physical examination on mechanical ventilation his blood pressure was 108/77, pulse rate 94, respiratory rate 24, oxygen saturation 100%.  He had edematous tongue protruding from his mouth, noted to be morbidly obese, his lungs were clear to auscultation, heart S1-S2 present and rhythmic, his abdomen was soft, no lower extremity edema.  He was admitted to the hospital with a working diagnosis of acute respiratory failure due to angioedema due to ACE inhibitors.  Assessment & Plan:   Active Problems:   HIV disease ([place])   Hypertension   COPD, moderate (HCC)   Angioedema   Pressure injury of skin  1. Angioedema induced respiratory failure, hypoxic and hypercapnic acute, due to ace inh. Patient with no dyspnea, no nausea or vomiting. Noted swallow dysfunction per nursing (retaines food in his mouth), will consult speech for further evaluation. Continue aspiration precautions, no further angioedema that I can see. Will continue oxymetry monitoring.   2. Severe deconditioning with metabolic encephalopathy. Patient with significant physical impairment, will consult social services to proceed with SNF placement, I spoke with patient and his wife and they agree with this disposition. Will need a  place close to [place].    Patient is more awake and alert, no confusion or agitation, will dc haldol for now. Continue seroquel. Patient did require precedex during this hospitalization.   3. COPD. Stable with no signs of exacerbation. Continue with budesonide and duoneb. Continue home dose of prednisone.   4. Sacrum pressure ulcer, not unable to stage, not present on admission, continue local wound care.   5. Obesity. Calculated BMI of 33.2. Will need outpatient follow up.   6. HIV. Continue with anti-retroviral therapy with good toleration.   7. HTN. Continue blood pressure control with metoprolol.   8. T2DM. On insulin sliding scale for glucose cover and monitoring. Patient is tolerating po well.   9. History of PE and DVT. Continue anticoagulation with apixaban.   10 . Hypokalemia. Continue K correction with Kc, for 40 meq bid today, serum K continue to be low at 3,2, Preserved renal function with serum cr at 0.99.   DVT prophylaxis: apixaban  Code Status:  full Family Communication: I spoke over the phone with his wife and all questions were addressed.  Disposition Plan/ discharge barriers: pending snf placement prior speech evaluation.   Body mass index is 33.21 kg/m. Malnutrition Type:  Nutrition Problem: Increased nutrient needs Etiology: cancer and cancer related treatments   Malnutrition Characteristics:  Signs/Symptoms: estimated needs   Nutrition Interventions:  Interventions: Refer to RD note for recommendations  RN Pressure Injury Documentation: Pressure Injury 01/13/19 Sacrum Medial Deep Tissue Injury - Purple or maroon localized area of discolored intact skin or blood-filled blister due to damage of underlying soft tissue from pressure and/or shear. (Active)  01/13/19 1227  Location: Sacrum  Location Orientation: Medial  Staging: Deep Tissue Injury - Purple or maroon localized area of discolored intact skin or blood-filled blister due to damage of  underlying soft tissue from pressure and/or shear.  Wound Description (Comments):   Present on Admission: No     Consultants:     Procedures:     Antimicrobials:       Subjective: Patient is feeling better, no dyspnea or chest pain, no dysphagia. Continue to be very weak and deconditioned. He is willing to go home but his family agrees with SNF, for rehab.    Objective: Vitals:   01/18/19 0448 01/18/19 0500 01/18/19 0912 01/18/19 0928  BP: 114/82   (!) 146/90  Pulse: 99   95  Resp: 18   18  Temp: 98.8 F (37.1 C)   98.4 F (36.9 C)  TempSrc:    Oral  SpO2: 92%  98% 95%  Weight:  102 kg    Height:        Intake/Output Summary (Last 24 hours) at 01/18/2019 1306 Last data filed at 01/18/2019 1751 Gross per 24 hour  Intake 270 ml  Output 750 ml  Net -480 ml   Filed Weights   01/17/19 0415 01/17/19 2050 01/18/19 0500  Weight: 102 kg 102 kg 102 kg    Examination:   General: deconditioned  Neurology: Awake and alert, non focal  E ENT: no pallor, no icterus, oral mucosa moist. No edema, no stridor.  Cardiovascular: No JVD. S1-S2 present, rhythmic, no gallops, rubs, or murmurs. No lower extremity edema. Pulmonary: positive breath sounds bilaterally, adequate air movement, no wheezing, rhonchi or rales. Gastrointestinal. Abdomen with no organomegaly, non tender, no rebound or guarding Skin. No rashes Musculoskeletal: no joint deformities     Data Reviewed: I have personally reviewed following labs and imaging studies  CBC: Recent Labs  Lab 01/12/19 0721 01/13/19 0420 01/13/19 1534 01/14/19 0330 01/15/19 0405 01/17/19 0558  WBC 12.2* 12.1*  --  12.3* 9.9 9.0  NEUTROABS  --   --   --   --   --  5.8  HGB 9.9* 10.2* 9.9* 9.5* 9.4* 10.4*  HCT 31.6* 33.3* 29.0* 30.5* 29.3* 32.7*  MCV 88.3 88.8  --  87.1 85.4 85.8  PLT 225 224  --  189 195 025   Basic Metabolic Panel: Recent Labs  Lab 01/14/19 0330 01/15/19 0405 01/15/19 2054 01/16/19 0242  01/17/19 0558  NA 139 137 140 140 141  K 3.8 2.9* 3.1* 3.5 3.2*  CL 111 111 112* 109 110  CO2 20* 19* 20* 20* 22  GLUCOSE 164* 123* 120* 120* 104*  BUN 16 21 17 16 14   CREATININE 0.89 0.95 1.02 0.95 0.99  CALCIUM 8.9 8.9 8.9 8.9 9.1  MG  --   --  1.9  --  1.9   GFR: Estimated Creatinine Clearance: 92.2 mL/min (by C-G formula based on SCr of 0.99 mg/dL). Liver Function Tests: No results for input(s): AST, ALT, ALKPHOS, BILITOT, PROT, ALBUMIN in the last 168 hours. No results for input(s): LIPASE, AMYLASE in the last 168 hours. No results for input(s): AMMONIA in the last 168 hours. Coagulation Profile: No results for input(s): INR, PROTIME in the last 168 hours. Cardiac Enzymes: No results for input(s): CKTOTAL, CKMB, CKMBINDEX, TROPONINI in the last 168 hours. BNP (last 3 results) No results for input(s): PROBNP in the last 8760 hours. HbA1C: No results for input(s): HGBA1C in the last 72 hours. CBG: Recent Labs  Lab 01/17/19 2110 01/18/19 0028 01/18/19 0447 01/18/19  7616 01/18/19 1130  GLUCAP 156* 106* 158* 102* 137*   Lipid Profile: No results for input(s): CHOL, HDL, LDLCALC, TRIG, CHOLHDL, LDLDIRECT in the last 72 hours. Thyroid Function Tests: No results for input(s): TSH, T4TOTAL, FREET4, T3FREE, THYROIDAB in the last 72 hours. Anemia Panel: No results for input(s): VITAMINB12, FOLATE, FERRITIN, TIBC, IRON, RETICCTPCT in the last 72 hours.    Radiology Studies: I have reviewed all of the imaging during this hospital visit personally     Scheduled Meds: . apixaban  5 mg Oral BID  . bictegravir-emtricitabine-tenofovir AF  1 tablet Oral Daily  . budesonide (PULMICORT) nebulizer solution  0.25 mg Nebulization Daily  . Chlorhexidine Gluconate Cloth  6 each Topical Daily  . gabapentin  600 mg Oral BID  . insulin aspart  0-20 Units Subcutaneous Q4H  . ipratropium-albuterol  3 mL Nebulization BID  . mouth rinse  15 mL Mouth Rinse BID  . metoprolol tartrate   50 mg Oral BID  . potassium chloride  40 mEq Oral BID  . predniSONE  5 mg Oral Q breakfast  . QUEtiapine  400 mg Oral BID   Continuous Infusions:   LOS: 8 days        Mauricio Gerome Apley, MD

## 2019-01-18 NOTE — Progress Notes (Signed)
Spoke with patients wife and updated her on patients care.

## 2019-01-18 NOTE — TOC Initial Note (Signed)
Transition of Care [facility]) - Initial/Assessment Note    Patient Details  Name: Jacob Joseph MRN: [id number] Date of Birth: [birth year]  Transition of Care [facility] CM/SW Contact:    Sable Feil, LCSW Phone Number: 01/18/2019, 4:16 PM  Clinical Narrative:   CSW talked with wife - Jacob Joseph by phone regarding her husband and recommendation of ST rehab. Wife reported that she talked with the MD this morning and was advised of this and is in agreement. Per Mrs. Gilberg, she has talked with her husband by phone and told him that he needs to go to a facility for ST rehab. When asked, wife responded that he has never been to a nursing facility for [place] rehab before. Mrs. Krupinski reported that she is not employed and can be reached during the day.                 Expected Discharge Plan: Skilled Nursing Facility Barriers to Discharge: [place] (PASRR), Insurance Authorization, SNF Pending bed offer   Patient Goals and CMS Choice: Wife wants patient to get better and more able to care for himself before coming home.   CMS Medicare.gov Compare Post Acute Care list provided to:: Other (Comment Required)(Wife indicated interest in a SNF in [place]. Names of 2 [place] SNF's provided to wife) Choice offered to / list presented to : Spouse(Wife will be provided with bed offers once facility responses received)  Expected Discharge Plan and Services Expected Discharge Plan: [place] In-house Referral: Clinical Social Work     Living arrangements for the past 2 months: Apartment                                     Prior Living Arrangements/Services Living arrangements for the past 2 months: Apartment Lives with:: Spouse Patient language and need for interpreter reviewed:: No Do you feel safe going back to the place where you live?: No(Wife in agreement with [facility] rehab for spouse in a skilledmnursing faciity)      Need for Family  Participation in Patient Care: Yes (Comment) Care giver support system in place?: Yes (comment)   Criminal Activity/Legal Involvement Pertinent to Current Situation/Hospitalization: No - Comment as needed  Activities of Daily Living      Permission Sought/Granted Permission sought to share information with : Family Supports Permission granted to share information with : No(Patient oriented to person, place and time only)              Emotional Assessment Appearance:: Other (Comment Required(Did not visit with patient, talked to wife by phone) Attitude/Demeanor/Rapport: Unable to Assess(Did not visit with patient) Affect (typically observed): Unable to Assess(Did not visit with patient) Orientation: : Oriented to Self, Oriented to Place, Oriented to  Time Alcohol / Substance Use: Tobacco Use, Alcohol Use, Illicit Drugs(Patient reports that he smokes cigarettes, does not drink and uses illicit drugs-cocaine) Psych Involvement: No (comment)  Admission diagnosis:  Angioedema, initial encounter [T78.3XXA] Endotracheally intubated [Z97.8] Patient Active Problem List   Diagnosis Date Noted  . Pressure injury of skin 01/13/2019  . Angioedema 01/10/2019  . Leukocytosis 09/01/2018  . Acute respiratory failure with hypoxia ([place]) 08/31/2018  . Severe sepsis ([place]) 08/31/2018  . Closed fracture of proximal end of left humerus with delayed healing 05/26/2018  . Spine metastasis ([place]) 03/24/2018  . Acute deep vein thrombosis (DVT) of right popliteal vein ([place])   . Prostate cancer  metastatic to multiple sites [facility])   . Radicular pain of thoracic region   . Bone lesion   . Calculus of gallbladder without cholecystitis without obstruction   . Chronic anticoagulation   . Right upper quadrant pain 02/26/2018  . Abnormal computed tomography of thoracic spine 02/26/2018  . Recurrent falls 11/02/2016  . History of prostate cancer 09/22/2016  . BPH associated with nocturia 06/19/2016  .  Substance abuse ([place]) 01/27/2016  . Vitamin D deficiency 12/05/2015  . Hyperlipidemia 01/28/2015  . Cataracts, bilateral 08/29/2014  . Diabetes mellitus treated with oral medication ([place]) 10/19/2013  . COPD, moderate ([place]) 05/31/2013  . Complex Lt Renal Cyst 03/22/2013  . Preventive measure 05/30/2012  . History of CVA 03/08/2012  . Normocytic anemia 03/08/2012  . CKD (chronic kidney disease) stage 3, GFR 30-59 ml/min (HCC) 03/08/2012  . ERECTILE DYSFUNCTION 04/25/2008  . HIV disease ([place]) 04/27/2006  . Smoking greater than 30 pack years 04/27/2006  . Major depressive disorder, recurrent episode ([place]) 04/27/2006  . Hypertension 04/27/2006  . DEGENERATIVE JOINT DISEASE 04/27/2006  . Chronic Low Back Pain 04/27/2006   PCP:  Neva Seat, MD Pharmacy:   [place], [place] [address] [place] [address] Phone: [phone] Fax: [phone]  [place], [place] [address] Phone: [phone] Fax: [phone]   Social Determinants of Health (SDOH) Interventions  No SDOH interventions needed at this time  Readmission Risk Interventions No flowsheet data found.

## 2019-01-19 LAB — BASIC METABOLIC PANEL
Anion gap: 11 (ref 5–15)
BUN: 13 mg/dL (ref 8–23)
CO2: 23 mmol/L (ref 22–32)
Calcium: 9.5 mg/dL (ref 8.9–10.3)
Chloride: 106 mmol/L (ref 98–111)
Creatinine, Ser: 1.23 mg/dL (ref 0.61–1.24)
GFR calc Af Amer: >60 mL/min (ref >60)
GFR calc non Af Amer: >60 mL/min (ref >60)
Glucose, Bld: 141 mg/dL — ABNORMAL HIGH (ref 70–99)
Potassium: 4 mmol/L (ref 3.5–5.1)
Sodium: 140 mmol/L (ref 135–145)

## 2019-01-19 LAB — GLUCOSE, CAPILLARY
Glucose-Capillary: 126 mg/dL — ABNORMAL HIGH (ref 70–99)
Glucose-Capillary: 135 mg/dL — ABNORMAL HIGH (ref 70–99)
Glucose-Capillary: 140 mg/dL — ABNORMAL HIGH (ref 70–99)
Glucose-Capillary: 150 mg/dL — ABNORMAL HIGH (ref 70–99)
Glucose-Capillary: 279 mg/dL — ABNORMAL HIGH (ref 70–99)
Glucose-Capillary: 92 mg/dL (ref 70–99)

## 2019-01-19 MED ORDER — ADULT MULTIVITAMIN W/MINERALS CH
1.0000 | ORAL_TABLET | Freq: Every day | ORAL | Status: DC
  Administered 2019-01-19 – 2019-01-23 (×5): 1 via ORAL
  Filled 2019-01-19 (×5): qty 1

## 2019-01-19 MED ORDER — GLUCERNA SHAKE PO LIQD
237.0000 mL | Freq: Three times a day (TID) | ORAL | Status: DC
  Administered 2019-01-19 – 2019-01-23 (×12): 237 mL via ORAL
  Filled 2019-01-19 (×14): qty 237

## 2019-01-19 NOTE — Progress Notes (Signed)
01/19/2019 11:53 AM  Spoke to patient regarding care concerns and assisted in service recovery. Also spoke with patient wife Ashante Snelling addressing her concerns. Both patient and wife verbalized appreciation.  Husam Hohn MSN, RN-BC, CNML [facility] Phone: [phone]

## 2019-01-19 NOTE — Progress Notes (Signed)
PROGRESS NOTE    Jacob Joseph  QTM:[id number] DOB: Dec 14, 1955 DOA: 01/10/2019 PCP: Neva Seat, MD    Brief Narrative:  63 year old male who presented with tongue swelling after starting lisinopril.  He does have significant past medical history for HIV, metastatic prostate cancer, COPD, type 2 diabetes mellitus, hepatitis C, hypertension and history of pulmonary embolism.  He presented to [facility] on June 22 for tongue swelling that occurred about an hour after taking lisinopril.  He received Benadryl, Solu-Medrol and epinephrine on route to the hospital, his symptoms were persistent and worsening, his airway was compromised and he was nasally intubated in the emergency department.  On his physical examination on mechanical ventilation his blood pressure was 108/77, pulse rate 94, respiratory rate 24, oxygen saturation 100%.  He had edematous tongue protruding from his mouth, noted to be morbidly obese, his lungs were clear to auscultation, heart S1-S2 present and rhythmic, his abdomen was soft, no lower extremity edema.  He was admitted to the hospital with a working diagnosis of acute respiratory failure due to angioedema due to ACE inhibitors.    Assessment & Plan:   Active Problems:   HIV disease ([place])   Hypertension   COPD, moderate (HCC)   Angioedema   Pressure injury of skin   1. Angioedema induced respiratory failure, hypoxic and hypercapnic acute, due to ace inh. His swallow has improved today, no nausea or vomiting, no dyspnea. Oxygenation well.   2. Severe deconditioning with metabolic encephalopathy. Patient continue to be very weak and deconditioned, continue physical therapy, out of bed as tolerated to the chair with meals. Pending placement at SNF. Encephalopathy has resolved.   3. COPD. On inhaled budesonide and duoneb nebs.  Oral dose of prednisone, per home regimen.   4. Sacrum pressure ulcer, not unable to stage, not present on admission, Will  continue local wound care, out of bed to the chair tid with meals, pending placement at SNF.   5. Obesity. Calculated BMI of 33.2.    6. HIV. Anti-retroviral therapy per his home regimen.   7. HTN. Continue blood pressure control with metoprolol.   8. T2DM. Continue with insulin sliding scale for glucose cover and monitoring. Fasting glucose is 141 this am.  9. History of PE and DVT. Apixaban for anticoagulation.    10 . Hypokalemia. K has been corrected, continue to encourage po intake.   DVT prophylaxis: apixaban  Code Status:  full Family Communication: no family at the bedside  Disposition Plan/ discharge barriers: pending snf placement.   Body mass index is 33.21 kg/m. Malnutrition Type:  Nutrition Problem: Increased nutrient needs Etiology: cancer and cancer related treatments   Malnutrition Characteristics:  Signs/Symptoms: estimated needs   Nutrition Interventions:  Interventions: Refer to RD note for recommendations  RN Pressure Injury Documentation: Pressure Injury 01/13/19 Sacrum Medial Deep Tissue Injury - Purple or maroon localized area of discolored intact skin or blood-filled blister due to damage of underlying soft tissue from pressure and/or shear. (Active)  01/13/19 1227  Location: Sacrum  Location Orientation: Medial  Staging: Deep Tissue Injury - Purple or maroon localized area of discolored intact skin or blood-filled blister due to damage of underlying soft tissue from pressure and/or shear.  Wound Description (Comments):   Present on Admission: No     Consultants:     Procedures:     Antimicrobials:       Subjective: Patient is feeling better, but continue to be very weak and deconditioned, no dysphagia today,  no nausea or vomiting, no chest pain or dyspnea.   Objective: Vitals:   01/19/19 0500 01/19/19 0832 01/19/19 0833 01/19/19 0844  BP:    126/83  Pulse:    97  Resp:    18  Temp:    98.4 F (36.9 C)  TempSrc:     Oral  SpO2:  93% 93% 94%  Weight: 102 kg     Height:        Intake/Output Summary (Last 24 hours) at 01/19/2019 1216 Last data filed at 01/19/2019 0930 Gross per 24 hour  Intake 730 ml  Output 1201 ml  Net -471 ml   Filed Weights   01/17/19 2050 01/18/19 0500 01/19/19 0500  Weight: 102 kg 102 kg 102 kg    Examination:   General: deconditioned Neurology: Awake and alert, non focal  E ENT: mild pallor, no icterus, oral mucosa moist Cardiovascular: No JVD. S1-S2 present, rhythmic, no gallops, rubs, or murmurs. No lower extremity edema. Pulmonary: positive breath sounds bilaterally, adequate air movement, no wheezing, rhonchi or rales. Gastrointestinal. Abdomen with no organomegaly, non tender, no rebound or guarding Skin. No rashes Musculoskeletal: no joint deformities     Data Reviewed: I have personally reviewed following labs and imaging studies  CBC: Recent Labs  Lab 01/13/19 0420 01/13/19 1534 01/14/19 0330 01/15/19 0405 01/17/19 0558  WBC 12.1*  --  12.3* 9.9 9.0  NEUTROABS  --   --   --   --  5.8  HGB 10.2* 9.9* 9.5* 9.4* 10.4*  HCT 33.3* 29.0* 30.5* 29.3* 32.7*  MCV 88.8  --  87.1 85.4 85.8  PLT 224  --  189 195 902   Basic Metabolic Panel: Recent Labs  Lab 01/15/19 0405 01/15/19 2054 01/16/19 0242 01/17/19 0558 01/19/19 0525  NA 137 140 140 141 140  K 2.9* 3.1* 3.5 3.2* 4.0  CL 111 112* 109 110 106  CO2 19* 20* 20* 22 23  GLUCOSE 123* 120* 120* 104* 141*  BUN 21 17 16 14 13   CREATININE 0.95 1.02 0.95 0.99 1.23  CALCIUM 8.9 8.9 8.9 9.1 9.5  MG  --  1.9  --  1.9  --    GFR: Estimated Creatinine Clearance: 74.2 mL/min (by C-G formula based on SCr of 1.23 mg/dL). Liver Function Tests: No results for input(s): AST, ALT, ALKPHOS, BILITOT, PROT, ALBUMIN in the last 168 hours. No results for input(s): LIPASE, AMYLASE in the last 168 hours. No results for input(s): AMMONIA in the last 168 hours. Coagulation Profile: No results for input(s): INR,  PROTIME in the last 168 hours. Cardiac Enzymes: No results for input(s): CKTOTAL, CKMB, CKMBINDEX, TROPONINI in the last 168 hours. BNP (last 3 results) No results for input(s): PROBNP in the last 8760 hours. HbA1C: No results for input(s): HGBA1C in the last 72 hours. CBG: Recent Labs  Lab 01/18/19 2111 01/19/19 0031 01/19/19 0424 01/19/19 0842 01/19/19 1138  GLUCAP 116* 140* 126* 279* 135*   Lipid Profile: No results for input(s): CHOL, HDL, LDLCALC, TRIG, CHOLHDL, LDLDIRECT in the last 72 hours. Thyroid Function Tests: No results for input(s): TSH, T4TOTAL, FREET4, T3FREE, THYROIDAB in the last 72 hours. Anemia Panel: No results for input(s): VITAMINB12, FOLATE, FERRITIN, TIBC, IRON, RETICCTPCT in the last 72 hours.    Radiology Studies: I have reviewed all of the imaging during this hospital visit personally     Scheduled Meds: . apixaban  5 mg Oral BID  . bictegravir-emtricitabine-tenofovir AF  1 tablet Oral Daily  .  budesonide (PULMICORT) nebulizer solution  0.25 mg Nebulization Daily  . Chlorhexidine Gluconate Cloth  6 each Topical Daily  . gabapentin  600 mg Oral BID  . insulin aspart  0-20 Units Subcutaneous Q4H  . ipratropium-albuterol  3 mL Nebulization BID  . mouth rinse  15 mL Mouth Rinse BID  . metoprolol tartrate  50 mg Oral BID  . potassium chloride  40 mEq Oral BID  . predniSONE  5 mg Oral Q breakfast  . QUEtiapine  400 mg Oral BID   Continuous Infusions:   LOS: 9 days        Chyane Greer Gerome Apley, MD

## 2019-01-19 NOTE — Progress Notes (Signed)
Nutrition Follow-up  DOCUMENTATION CODES:   Obesity unspecified  INTERVENTION:    Glucerna Shake po TID, each supplement provides 220 kcal and 10 grams of protein  MVI daily  NUTRITION DIAGNOSIS:   Increased nutrient needs related to cancer and cancer related treatments as evidenced by estimated needs.  Ongoing  GOAL:   Patient will meet greater than or equal to 90% of their needs  Progressing  MONITOR:   Diet advancement, Labs, I & O's, Weight trends  REASON FOR ASSESSMENT:   Ventilator    ASSESSMENT:   Patient with PMH significant for COPD, depression, DM, Hepatitis C, HIV, HTN, PAD, CVA, and prostate cancer metastatic to multiple sites. Presents this admission with angioedema likely secondary to ACE-I.   6/26 - extubated 6/29- diet advanced to heart healthy  RD working remotely.  Attempted to call pt in room, phone was busy. Meal completions charted as 50-75% for pt's last five meals. SLP saw pt today and recommended continued regular consistency with thin liquids. Noted pt has new DTI on sacrum. RD to provide supplementation to maximize kcals and protein.   Admission weight: 95.2 kg Current weight 102 kg  I/O: -1,200 ml since admit UOP: 800 ml  X 24 hrs   Medications: SS novolog, 40 mEq KCl BID, prednisone Labs: CBG [id number]   Diet Order:   Diet Order            Diet Heart Room service appropriate? Yes; Fluid consistency: Thin  Diet effective now              EDUCATION NEEDS:   No education needs have been identified at this time  Skin:  Skin Assessment: Skin Integrity Issues: Skin Integrity Issues:: DTI DTI: sacrum  Last BM:  7/2  Height:   Ht Readings from Last 1 Encounters:  01/10/19 5\' 9"  (1.753 m)    Weight:   Wt Readings from Last 1 Encounters:  01/19/19 102 kg    Ideal Body Weight:  72.7 kg  BMI:  Body mass index is 33.21 kg/m.  Estimated Nutritional Needs:   Kcal:  2200-2400  Protein:  110-125 grams  Fluid:   >/= 2.0 L   Mariana Single RD, LDN Clinical Nutrition Pager # [phone]

## 2019-01-19 NOTE — Evaluation (Signed)
Clinical/Bedside Swallow Evaluation Patient Details  Name: Jacob Joseph MRN: [id number] Date of Birth: June 16, 1956  Today's Date: 01/19/2019 Time: SLP Start Time (ACUTE ONLY): 3 SLP Stop Time (ACUTE ONLY): 1247 SLP Time Calculation (min) (ACUTE ONLY): 17 min  Past Medical History:  Past Medical History:  Diagnosis Date  . AKI (acute kidney injury) ([place]) 09/24/2016  . Calcium oxalate crystals in urine 12/23/2011   Asymptomatic, no hematuria. Advised to take plenty of water.  [name] Kitchen COPD (chronic obstructive pulmonary disease) ([place])   . Depression   . Diabetes mellitus without complication ([place]) 1638   pre diabetic  . Hepatitis C   . HIV (human immunodeficiency virus infection) ([place])   . Hypertension   . PE (pulmonary thromboembolism) ([place]) 05/12/2016   PE in 2017, on chronic anticoagulation for this and recurrent DVT.  [name] Kitchen Peripheral arterial disease ([place])    ooccluded left SFA by duplex ultrasound, tibial disease left greater than right  . Prostate cancer metastatic to multiple sites [facility])   . Stroke ([place]) 11/2011   Carotids Doppler negative. Right sided weakness resolved, initially on Plavix for 3 months and then continued with ASA.    . TB lung, latent 1988   Treated   Past Surgical History:  Past Surgical History:  Procedure Laterality Date  . HERNIA REPAIR    . INGUINAL HERNIA REPAIR  01/16/2012   Procedure: HERNIA REPAIR INGUINAL INCARCERATED;  Surgeon: Zenovia Jarred, MD;  Location: [facility];  Service: General;  Laterality: Right;  . TEE WITHOUT CARDIOVERSION  12/07/2011   Procedure: TRANSESOPHAGEAL ECHOCARDIOGRAM (TEE);  Surgeon: Birdie Riddle, MD;  Location: [facility] ENDOSCOPY;  Service: Cardiovascular;  Laterality: N/A;   HPI:  Patient is a 63 y.o. male who presented to the emergency department with angioedema shortly after starting on lisinopril. Nasally intubated in the emergency department transferred to [facility]. Intubated 6/22-6/26. Experienced post-extubation dysphagia for  several days, but improved and was started on a PO diet and D/Cd from SLP services on 6/29.  Subsequently, pt noted to have oral holding during meals per nursing, likely related to encephalopathy.  New SLP swallow eval orders written 7/01.    Assessment / Plan / Recommendation Clinical Impression  Upon entering room for swallow assessment, pt found seated in recliner, leaning forward with face nearly in tray, trying to feed himself potato chips.  Repositioned; requires 1:1 assistance with self-feeding.  He continues to demonstrate adequate airway protection with thin liquids, consuming 6 oz water without s/s of aspiration.  Solid foods require prolonged mastication, likely due to absence of upper dentures (pt declined to wear them); however, he demonstrates oral reside post-swallow with limited awareness, bilaterally.  There are no focal CN deficits.  Pt alert, answers basic questions.  Recommend continuing current regular diet, thin liquids.  Please provide assistance with tray-set up and feeding - pt can't reliably feed himself at this time.  Check oral cavity for pocketing prior to leaving pt alone after a meal.  Mr. Proehl will benefit from SLP f/u at SNF level.        Aspiration Risk  Mild aspiration risk    Diet Recommendation   continue regular solids, thin liquids with full assistance for meals.  Medication Administration: Whole meds with liquid    Other  Recommendations Oral Care Recommendations: Oral care BID   Follow up Recommendations Skilled Nursing facility      Frequency and Duration            Prognosis  Swallow Study   General HPI: Patient is a 63 y.o. male who presented to the emergency department with angioedema shortly after starting on lisinopril. Nasally intubated in the emergency department transferred to [facility]. Intubated 6/22-6/26. Experienced post-extubation dysphagia for several days, but improved and was started on a PO diet and D/Cd from SLP  services on 6/29.  Subsequently, pt noted to have oral holding during meals per nursing, likely related to encephalopathy.  New SLP swallow eval orders written 7/01.  Type of Study: Bedside Swallow Evaluation Previous Swallow Assessment: see HPI Diet Prior to this Study: Thin liquids;Regular Temperature Spikes Noted: No Respiratory Status: Room air History of Recent Intubation: Yes Length of Intubations (days): 5 days Date extubated: 01/13/19 Behavior/Cognition: Alert;Cooperative Oral Cavity Assessment: Within Functional Limits Oral Care Completed by SLP: No Oral Cavity - Dentition: Dentures, top;Missing dentition Vision: Functional for self-feeding Self-Feeding Abilities: Needs assist Patient Positioning: Upright in chair Baseline Vocal Quality: Normal Volitional Cough: Strong Volitional Swallow: Able to elicit    Oral/Motor/Sensory Function Overall Oral Motor/Sensory Function: Within functional limits   Ice Chips Ice chips: Within functional limits   Thin Liquid Thin Liquid: Within functional limits    Nectar Thick Nectar Thick Liquid: Not tested   Honey Thick Honey Thick Liquid: Not tested   Puree Puree: Impaired Presentation: Spoon Oral Phase Functional Implications: Prolonged oral transit;Right lateral sulci pocketing;Left lateral sulci pocketing;Oral residue   Solid     Solid: Impaired Presentation: Self Fed Oral Phase Functional Implications: Prolonged oral transit;Oral residue      Jacob Joseph 01/19/2019,12:53 PM  Jacob Joseph Jacob Joseph, [place] Office number [phone] Pager [phone]

## 2019-01-19 NOTE — Plan of Care (Signed)
  Problem: Pain Managment: Goal: General experience of comfort will improve Outcome: Progressing   Problem: Pain Managment: Goal: General experience of comfort will improve Outcome: Progressing   Problem: Safety: Goal: Ability to remain free from injury will improve Outcome: Progressing

## 2019-01-19 NOTE — TOC Progression Note (Signed)
Transition of Care [facility]) - Progression Note    Patient Details  Name: Jacob Joseph MRN: [id number] Date of Birth: 04/12/1956  Transition of Care [facility]) CM/SW [place], LCSW Phone Number: 01/19/2019, 5:32 PM  Clinical Narrative:    CSW received call from [place] evaluator and answered her questions. Pasrr still pending. CSW also spoke with patient's wife to provide SNF bed offers. She is in agreement with [facility]. CSW will reach out to them to start insurance authorization process.    Expected Discharge Plan: Skilled Nursing Facility Barriers to Discharge: [place] (PASRR), Insurance Authorization, SNF Pending bed offer  Expected Discharge Plan and Services Expected Discharge Plan: [place] In-house Referral: Clinical Social Work     Living arrangements for the past 2 months: Apartment                                       Social Determinants of Health (SDOH) Interventions    Readmission Risk Interventions No flowsheet data found.

## 2019-01-19 NOTE — Plan of Care (Signed)
  Problem: Nutrition: Goal: Adequate nutrition will be maintained Outcome: Progressing   Problem: Activity: Goal: Risk for activity intolerance will decrease Outcome: Progressing   Problem: Clinical Measurements: Goal: Ability to maintain clinical measurements within normal limits will improve Outcome: Progressing   Problem: Health Behavior/Discharge Planning: Goal: Ability to manage health-related needs will improve Outcome: Progressing   Problem: Nutrition: Goal: Adequate nutrition will be maintained Outcome: Progressing   Problem: Elimination: Goal: Will not experience complications related to bowel motility Outcome: Progressing   Problem: Safety: Goal: Ability to remain free from injury will improve Outcome: Progressing

## 2019-01-20 ENCOUNTER — Inpatient Hospital Stay (HOSPITAL_COMMUNITY): Payer: 59

## 2019-01-20 ENCOUNTER — Other Ambulatory Visit: Payer: Self-pay | Admitting: Internal Medicine

## 2019-01-20 LAB — BASIC METABOLIC PANEL
Anion gap: 8 (ref 5–15)
BUN: 17 mg/dL (ref 8–23)
CO2: 22 mmol/L (ref 22–32)
Calcium: 9.7 mg/dL (ref 8.9–10.3)
Chloride: 108 mmol/L (ref 98–111)
Creatinine, Ser: 1.17 mg/dL (ref 0.61–1.24)
GFR calc Af Amer: >60 mL/min (ref >60)
GFR calc non Af Amer: >60 mL/min (ref >60)
Glucose, Bld: 138 mg/dL — ABNORMAL HIGH (ref 70–99)
Potassium: 4.3 mmol/L (ref 3.5–5.1)
Sodium: 138 mmol/L (ref 135–145)

## 2019-01-20 LAB — GLUCOSE, CAPILLARY
Glucose-Capillary: 102 mg/dL — ABNORMAL HIGH (ref 70–99)
Glucose-Capillary: 116 mg/dL — ABNORMAL HIGH (ref 70–99)
Glucose-Capillary: 134 mg/dL — ABNORMAL HIGH (ref 70–99)
Glucose-Capillary: 136 mg/dL — ABNORMAL HIGH (ref 70–99)
Glucose-Capillary: 155 mg/dL — ABNORMAL HIGH (ref 70–99)
Glucose-Capillary: 160 mg/dL — ABNORMAL HIGH (ref 70–99)

## 2019-01-20 MED ORDER — PREDNISONE 5 MG PO TABS
2.5000 mg | ORAL_TABLET | Freq: Every day | ORAL | Status: DC
  Administered 2019-01-21 – 2019-01-23 (×3): 2.5 mg via ORAL
  Filled 2019-01-20 (×3): qty 1

## 2019-01-20 MED ORDER — METFORMIN HCL 500 MG PO TABS
500.0000 mg | ORAL_TABLET | Freq: Two times a day (BID) | ORAL | Status: DC
  Administered 2019-01-21 – 2019-01-23 (×5): 500 mg via ORAL
  Filled 2019-01-20 (×6): qty 1

## 2019-01-20 NOTE — Progress Notes (Addendum)
Occupational Therapy Treatment Patient Details Name: Jacob Joseph MRN: [id number] DOB: 30-Jul-1955 Today's Date: 01/20/2019    History of present illness 63 year old male presented to the emergency department with tongue swelling shortly after starting on lisinopril.  Nasally intubated in the emergency department transferred to [facility].  Intubated 6/22-6/26; previous CVA    OT comments  Pt presents seated in recliner pleasant and willing to participate in therapy session. Pt performing seated UB and grooming ADL with setup/supervision assist. Pt requiring minA for sit<>stand at [place], performing x3 throughout session. Attempted standing grooming ADL with pt requiring min-modA for standing balance with single UE and with no UE support however pt unable to maintain balance for more than a few seconds during functional task, required return to sitting for completion. Pt in agreement for SNF level therapies at time of discharge. Feel he remains appropriate for post acute rehab services to progress pt towards PLOF. Will continue to follow acutely.   Follow Up Recommendations  SNF    Equipment Recommendations  3 in 1 bedside commode          Precautions / Restrictions Precautions Precautions: Fall Precaution Comments: Tachycardic with activity       Mobility Bed Mobility               General bed mobility comments: up in chair on arrival  Transfers Overall transfer level: Needs assistance Equipment used: Rolling walker (2 wheeled) Transfers: Sit to/from Stand Sit to Stand: Min assist         General transfer comment: performing x3 during session; boosting and steadying assist at [place], pt with poor eccentric control when returning to sitting, cued for increased use of UEs to assist    Balance Overall balance assessment: Needs assistance         Standing balance support: During functional activity;Bilateral upper extremity supported Standing balance-Leahy Scale:  Poor Standing balance comment: pt reliant on UE support, unable to utilize single/no UE support for functional task                           ADL either performed or assessed with clinical judgement   ADL Overall ADL's : Needs assistance/impaired Eating/Feeding: Set up;Supervision/ safety;Sitting Eating/Feeding Details (indicate cue type and reason): pt able to manage drink containers end of session, opening drink can and pouring into cup Grooming: Wash/dry hands;Wash/dry face;Oral care;Set up;Min guard;Sitting;Moderate assistance;Standing Grooming Details (indicate cue type and reason): pt initially performing x2 grooming tasks in sitting with setup assist, trialled x1 in standing - pt able to initiate hand washing including turning on water and obtaining soap but with difficulty maintaining standing balance without bil UE support, returned to sitting to complete task          Upper Body Dressing : Minimal assistance;Sitting Upper Body Dressing Details (indicate cue type and reason): doffing/donning new gown                 Functional mobility during ADLs: Minimal assistance;Rolling walker(sit<>stand)                         Cognition Arousal/Alertness: Awake/alert Behavior During Therapy: WFL for tasks assessed/performed                                 Problem Solving: Slow processing General Comments: pt with mild delay in processing, A&O x4  today and following commands appropriately, very pleasant; after exiting room pt's chair alarm going off due to pt attempting to get up despite being educated about calling for nursing staff for assist when needed                           Pertinent Vitals/ Pain       Pain Assessment: No/denies pain  Home Living                                          Prior Functioning/Environment              Frequency  Min 2X/week        Progress Toward Goals  OT  Goals(current goals can now be found in the care plan section)  Progress towards OT goals: Progressing toward goals  Acute Rehab OT Goals Patient Stated Goal: pt agreeable to rehab  OT Goal Formulation: With patient Time For Goal Achievement: 01/31/19 Potential to Achieve Goals: Good ADL Goals Pt Will Perform Eating: with min assist;sitting;with adaptive utensils Pt Will Perform Grooming: with min assist;sitting Pt Will Transfer to Toilet: with mod assist;stand pivot transfer;bedside commode Additional ADL Goal #1: pt will complete bed mobility min (A) as precursor to adls.  Plan Discharge plan remains appropriate    Co-evaluation                 AM-PAC OT "6 Clicks" Daily Activity     Outcome Measure   Help from another person eating meals?: A Little Help from another person taking care of personal grooming?: A Little Help from another person toileting, which includes using toliet, bedpan, or urinal?: A Lot Help from another person bathing (including washing, rinsing, drying)?: A Lot Help from another person to put on and taking off regular upper body clothing?: A Little Help from another person to put on and taking off regular lower body clothing?: A Lot 6 Click Score: 15    End of Session Equipment Utilized During Treatment: Gait belt;Rolling walker  OT Visit Diagnosis: Unsteadiness on feet (R26.81);Muscle weakness (generalized) (M62.81)   Activity Tolerance Patient tolerated treatment well   Patient Left in chair;with call bell/phone within reach;with chair alarm set   Nurse Communication Mobility status        Time: 2595-6387 OT Time Calculation (min): 22 min  Charges: OT General Charges $OT Visit: 1 Visit OT Treatments $Self Care/Home Management : 8-22 mins  Lou Cal, [place] Pager [phone] Office [place] 01/20/2019, 10:27 AM

## 2019-01-20 NOTE — Clinical Social Work Note (Addendum)
CSW continuing to follow. Patient does not have a PASRR number and facility needs another PT note to initiate authorization,as the last time PT worked with patient is 6/30. Occupational therapy worked with patient earlier today and their note was transmitted to facility. Call made to [facility] (1:83 pm) and advised that [place], admissions director has left for the day.  Odilia Damico Givens, MSW, LCSW Licensed Clinical Social Worker [place] [phone]

## 2019-01-20 NOTE — Plan of Care (Signed)
  Problem: Health Behavior/Discharge Planning: Goal: Ability to manage health-related needs will improve Outcome: Progressing   Problem: Activity: Goal: Risk for activity intolerance will decrease Outcome: Progressing   Problem: Nutrition: Goal: Adequate nutrition will be maintained Outcome: Progressing   Problem: Elimination: Goal: Will not experience complications related to bowel motility Outcome: Completed/Met   Problem: Safety: Goal: Ability to remain free from injury will improve Outcome: Progressing   Problem: Skin Integrity: Goal: Risk for impaired skin integrity will decrease Outcome: Progressing

## 2019-01-20 NOTE — Progress Notes (Signed)
Patient called for assistance to go to the bathroom. Assisted with 1 assist to bedside commode. Instructed patient to call for assistance when finished. Call bell within reach of patient.  RN went out of room to assist another patient. Patient did use the call bell and did ask for assistance to go back to bed. Another RN was on the way to his room and found him on the floor sitting.   Floor staff assisted him back to bed and assessed him for any pain or discomforts. Patient denied of any discomforts or pain. Vital signs taken and stable.   MD and wife was notified and aware of patient well being.

## 2019-01-20 NOTE — Progress Notes (Signed)
Physical Therapy Treatment Patient Details Name: Jacob Joseph MRN: [id number] DOB: 1955-11-20 Today's Date: 01/20/2019    History of Present Illness 63 year old male presented to the emergency department with tongue swelling shortly after starting on lisinopril.  Nasally intubated in the emergency department transferred to [facility].  Intubated 6/22-6/26; previous CVA     PT Comments    Patient progressing mildly with therapy, standing with mod A and use of RW. unable to safely ambulate off bed, took several side steps with premature sits x4 this visit. Cont to rec SNF.    Follow Up Recommendations  SNF;Supervision/Assistance - 24 hour     Equipment Recommendations  Rolling walker with 5" wheels;3in1 (PT)    Recommendations for Other Services       Precautions / Restrictions Precautions Precautions: Fall Precaution Comments: Tachycardic with activity    Mobility  Bed Mobility Overal bed mobility: Needs Assistance Bed Mobility: Supine to Sit           General bed mobility comments: up in chair on arrival  Transfers Overall transfer level: Needs assistance Equipment used: Rolling walker (2 wheeled) Transfers: Sit to/from Stand           General transfer comment: pt with mod A to stand, several times. flexed posture, unable to stand upright due to weakness. side stepped along bed 1-2 steps at a time with premature sit back onto bed and fatigue.   Ambulation/Gait             General Gait Details: unable to safely ambulate at this time    Stairs             Wheelchair Mobility    Modified Rankin (Stroke Patients Only)       Balance Overall balance assessment: Needs assistance   Sitting balance-Leahy Scale: Fair   Postural control: Left lateral lean                                  Cognition Arousal/Alertness: Lethargic Behavior During Therapy: WFL for tasks assessed/performed Overall Cognitive Status: Impaired/Different  from baseline                           Safety/Judgement: Decreased awareness of deficits;Decreased awareness of safety   Problem Solving: Slow processing General Comments: improving [place] but still some slow processing.       Exercises      General Comments        Pertinent Vitals/Pain      Home Living Family/patient expects to be discharged to:: Private residence Living Arrangements: Spouse/significant other Available Help at Discharge: Family;Available 24 hours/day Type of Home: Apartment Home Access: Stairs to enter Entrance Stairs-Rails: Right;Left;Can reach both Home Layout: One level Home Equipment: Cane - single point Additional Comments: lost most of DME after moving out of last apartment, was thrown away per patient    Prior Function Level of Independence: Independent with assistive device(s)      Comments: Community ambulator with [facility], wife drives   PT Goals (current goals can now be found in the care plan section) Acute Rehab PT Goals Patient Stated Goal: to get more rehab PT Goal Formulation: Patient unable to participate in goal setting Time For Goal Achievement: 01/28/19 Potential to Achieve Goals: Good Progress towards PT goals: Progressing toward goals    Frequency    Min 3X/week      PT  Plan Current plan remains appropriate    Co-evaluation              AM-PAC PT "6 Clicks" Mobility   Outcome Measure  Help needed turning from your back to your side while in a flat bed without using bedrails?: A Little Help needed moving from lying on your back to sitting on the side of a flat bed without using bedrails?: A Little Help needed moving to and from a bed to a chair (including a wheelchair)?: A Lot Help needed standing up from a chair using your arms (e.g., wheelchair or bedside chair)?: A Lot Help needed to walk in hospital room?: A Lot Help needed climbing 3-5 steps with a railing? : A Lot 6 Click Score: 14    End of  Session Equipment Utilized During Treatment: Gait belt Activity Tolerance: Patient tolerated treatment well Patient left: in chair;with call bell/phone within reach;with chair alarm set Nurse Communication: Mobility status PT Visit Diagnosis: Unsteadiness on feet (R26.81);Muscle weakness (generalized) (M62.81)     Time: 1856-3149 PT Time Calculation (min) (ACUTE ONLY): 18 min  Charges:  $Therapeutic Activity: 8-22 mins                     Reinaldo Berber, PT, DPT Acute Rehabilitation Services Pager: [phone] Office: [phone]     Reinaldo Berber 01/20/2019, 4:24 PM

## 2019-01-20 NOTE — Progress Notes (Signed)
PROGRESS NOTE    Jacob Joseph  SHF:[id number] DOB: [birth year] DOA: 01/10/2019 PCP: Neva Seat, MD    Brief Narrative:  63 year old male who presented with tongue swelling after starting lisinopril. He does have significant past medical history for HIV, metastatic prostate cancer, COPD, type 2 diabetes mellitus, hepatitis C, hypertension and history of pulmonary embolism. He presented to [facility] on June 22 for tongue swelling that occurred about an hour after taking lisinopril. He received Benadryl, Solu-Medrol and epinephrine on route to the hospital, his symptoms were persistent and worsening, his airway was compromised and he was nasally intubated in the emergency department. On his physical examination on mechanical ventilation his blood pressure was 108/77, pulse rate 94, respiratory rate 24, oxygen saturation 100%. He had edematous tongue protruding from his mouth,noted to be morbidly obese, his lungswereclear to auscultation, heart S1-S2 present and rhythmic, his abdomen was soft, no lower extremity edema.  He was admitted to the hospital with a working diagnosis of acute respiratory failure due to angioedema due to ACE inhibitors.   Assessment & Plan:   Active Problems:   HIV disease ([place])   Hypertension   COPD, moderate (HCC)   Angioedema   Pressure injury of skin   1. Angioedema induced respiratory failure, hypoxic and hypercapnic acute, due to ace inh.No further dyspnea, tolerating po well, has been out of bed to the chair.   2. Severe deconditioning with metabolic encephalopathy. Pending placement at SNF, patient continue to be very weak and deconditioned. Had CVA in the past, today has a subjective weakness on the right side more than left. On examination he has symmetric weakness 4/5/ all extremities. Will do a non contrast CT head for now.   3. COPD. Continue with inhaled budesonide and duoneb nebs. Will continue to taper steroids down to 2,5  mg daily, in the setting of generalized musculoskeletal weakness.    4. Sacrum pressure ulcer, not unable to stage, not present on admission, Local wound care, pending placement at SNF to improve mobility.   5. Obesity. BMI of 33.2.    6. HIV. Continue with anti-retroviral therapy with good toleration.    7. HTN. On metoprolol for blood pressure control  8. T2DM. Glucose has remained stable, patient is tolerating po well, will discontinue insulin therapy and will resume po metformin.   9. History of PE and DVT. Continue anticoagulation with apixaban.    10 . Hypokalemia. resolved. Continue to encourage po intake.   DVT prophylaxis:apixaban Code Status:full Family Communication:no family at the bedside  Disposition Plan/ discharge barriers:pending snf placement  Body mass index is 32.69 kg/m. Malnutrition Type:  Nutrition Problem: Increased nutrient needs Etiology: cancer and cancer related treatments   Malnutrition Characteristics:  Signs/Symptoms: estimated needs   Nutrition Interventions:  Interventions: Refer to RD note for recommendations  RN Pressure Injury Documentation: Pressure Injury 01/13/19 Sacrum Medial Deep Tissue Injury - Purple or maroon localized area of discolored intact skin or blood-filled blister due to damage of underlying soft tissue from pressure and/or shear. (Active)  01/13/19 1227  Location: Sacrum  Location Orientation: Medial  Staging: Deep Tissue Injury - Purple or maroon localized area of discolored intact skin or blood-filled blister due to damage of underlying soft tissue from pressure and/or shear.  Wound Description (Comments):   Present on Admission: No     Consultants:     Procedures:     Antimicrobials:       Subjective: Patient is feeling better, continue to have generalized  weakness and not back to baseline, has a predominant right sided weakness, no nausea or vomiting, no chest pain or dyspnea.    Objective: Vitals:   01/20/19 0427 01/20/19 0500 01/20/19 0839 01/20/19 0839  BP: 137/84   (!) 142/88  Pulse: 86   93  Resp: 18   18  Temp: 98 F (36.7 C)   98.5 F (36.9 C)  TempSrc:    Oral  SpO2: 100%  99% 99%  Weight:  100.4 kg    Height:        Intake/Output Summary (Last 24 hours) at 01/20/2019 1233 Last data filed at 01/20/2019 0900 Gross per 24 hour  Intake 720 ml  Output 600 ml  Net 120 ml   Filed Weights   01/19/19 0500 01/19/19 2013 01/20/19 0500  Weight: 102 kg 100.4 kg 100.4 kg    Examination:   General: Not in pain or dyspnea, deconditioned  Neurology: Awake and alert, non focal  E ENT: mild pallor, no icterus, oral mucosa moist Cardiovascular: No JVD. S1-S2 present, rhythmic, no gallops, rubs, or murmurs. No lower extremity edema. Pulmonary: positive breath sounds bilaterally, adequate air movement, no wheezing, rhonchi or rales. Gastrointestinal. Abdomen with no organomegaly, non tender, no rebound or guarding Skin. No rashes Musculoskeletal: no joint deformities     Data Reviewed: I have personally reviewed following labs and imaging studies  CBC: Recent Labs  Lab 01/13/19 1534 01/14/19 0330 01/15/19 0405 01/17/19 0558  WBC  --  12.3* 9.9 9.0  NEUTROABS  --   --   --  5.8  HGB 9.9* 9.5* 9.4* 10.4*  HCT 29.0* 30.5* 29.3* 32.7*  MCV  --  87.1 85.4 85.8  PLT  --  189 195 102   Basic Metabolic Panel: Recent Labs  Lab 01/15/19 2054 01/16/19 0242 01/17/19 0558 01/19/19 0525 01/20/19 0510  NA 140 140 141 140 138  K 3.1* 3.5 3.2* 4.0 4.3  CL 112* 109 110 106 108  CO2 20* 20* 22 23 22   GLUCOSE 120* 120* 104* 141* 138*  BUN 17 16 14 13 17   CREATININE 1.02 0.95 0.99 1.23 1.17  CALCIUM 8.9 8.9 9.1 9.5 9.7  MG 1.9  --  1.9  --   --    GFR: Estimated Creatinine Clearance: 77.5 mL/min (by C-G formula based on SCr of 1.17 mg/dL). Liver Function Tests: No results for input(s): AST, ALT, ALKPHOS, BILITOT, PROT, ALBUMIN in the last 168  hours. No results for input(s): LIPASE, AMYLASE in the last 168 hours. No results for input(s): AMMONIA in the last 168 hours. Coagulation Profile: No results for input(s): INR, PROTIME in the last 168 hours. Cardiac Enzymes: No results for input(s): CKTOTAL, CKMB, CKMBINDEX, TROPONINI in the last 168 hours. BNP (last 3 results) No results for input(s): PROBNP in the last 8760 hours. HbA1C: No results for input(s): HGBA1C in the last 72 hours. CBG: Recent Labs  Lab 01/19/19 2012 01/20/19 0031 01/20/19 0427 01/20/19 0728 01/20/19 1141  GLUCAP 150* 155* 116* 136* 160*   Lipid Profile: No results for input(s): CHOL, HDL, LDLCALC, TRIG, CHOLHDL, LDLDIRECT in the last 72 hours. Thyroid Function Tests: No results for input(s): TSH, T4TOTAL, FREET4, T3FREE, THYROIDAB in the last 72 hours. Anemia Panel: No results for input(s): VITAMINB12, FOLATE, FERRITIN, TIBC, IRON, RETICCTPCT in the last 72 hours.    Radiology Studies: I have reviewed all of the imaging during this hospital visit personally     Scheduled Meds: . apixaban  5 mg Oral  BID  . bictegravir-emtricitabine-tenofovir AF  1 tablet Oral Daily  . budesonide (PULMICORT) nebulizer solution  0.25 mg Nebulization Daily  . Chlorhexidine Gluconate Cloth  6 each Topical Daily  . feeding supplement (GLUCERNA SHAKE)  237 mL Oral TID BM  . gabapentin  600 mg Oral BID  . insulin aspart  0-20 Units Subcutaneous Q4H  . ipratropium-albuterol  3 mL Nebulization BID  . mouth rinse  15 mL Mouth Rinse BID  . metoprolol tartrate  50 mg Oral BID  . multivitamin with minerals  1 tablet Oral Daily  . potassium chloride  40 mEq Oral BID  . predniSONE  5 mg Oral Q breakfast  . QUEtiapine  400 mg Oral BID   Continuous Infusions:   LOS: 10 days        Adama Ferber Gerome Apley, MD

## 2019-01-21 LAB — GLUCOSE, CAPILLARY
Glucose-Capillary: 150 mg/dL — ABNORMAL HIGH (ref 70–99)
Glucose-Capillary: 98 mg/dL (ref 70–99)
Glucose-Capillary: 116 mg/dL — ABNORMAL HIGH (ref 70–99)
Glucose-Capillary: 124 mg/dL — ABNORMAL HIGH (ref 70–99)

## 2019-01-21 NOTE — Progress Notes (Addendum)
1:28pm Update:  CSW received insurance authorization for the patient. The patient's PASSR is still under review.   CSW faxed over most recent PT note for insurance authorization.   CSW will continue to follow and assist with discharge.   Domenic Schwab, MSW, [place]

## 2019-01-21 NOTE — Progress Notes (Signed)
PROGRESS NOTE    Jacob Joseph  WNU:[id number] DOB: 11-04-1955 DOA: 01/10/2019 PCP: Neva Seat, MD    Brief Narrative:  63 year old male who presented with tongue swelling after starting lisinopril. He does have significant past medical history for HIV, metastatic prostate cancer, COPD, type 2 diabetes mellitus, hepatitis C, hypertension and history of pulmonary embolism. He presented to [facility] on June 22 for tongue swelling that occurred about an hour after taking lisinopril. He received Benadryl, Solu-Medrol and epinephrine on route to the hospital, his symptoms were persistent and worsening, his airway was compromised and he was nasally intubated in the emergency department. On his physical examination on mechanical ventilation his blood pressure was 108/77, pulse rate 94, respiratory rate 24, oxygen saturation 100%. He had edematous tongue protruding from his mouth,noted to be morbidly obese, his lungswereclear to auscultation, heart S1-S2 present and rhythmic, his abdomen was soft, no lower extremity edema.  He was admitted to the hospital with a working diagnosis of acute respiratory failure due to angioedema due to ACE inhibitors.   Assessment & Plan:   Active Problems:   HIV disease ([place])   Hypertension   COPD, moderate (HCC)   Angioedema   Pressure injury of skin   1. Angioedema induced respiratory failure, hypoxic and hypercapnic acute, due to ace inh.Patient with no dyspnea, angioedema has resolved.  2. Severe deconditioning with metabolic encephalopathy. Patient very weak and deconditioned, sustained a mechanical fall with no head trauma yesterday, continue physical therapy. His follow head CT showed old bilateral MCA distributions CVA.    3. COPD.On inhlaedbudesonide and duonebnebs.Tolerating well slow taper of systemic steroids.  4. Sacrum pressure ulcer, not unable to stage,not present on admission,Continue with local wound  care,physical therapy.  5. Obesity. BMI of 33.2. Follow at SNF.   6. HIV.On anti-retroviral therapy per home regimen.   7. HTN. Blood pressure controlled with metoprolol, systolic 034 to 742 mmHg.   8. T2DM.Continue metformin for glucose control.  9. History of PE and DVT.Anticoagulation with apixaban.  DVT prophylaxis:apixaban Code Status:full Family Communication:no family at the bedside Disposition Plan/ discharge barriers:pending snf placement   Body mass index is 32.1 kg/m. Malnutrition Type:  Nutrition Problem: Increased nutrient needs Etiology: cancer and cancer related treatments   Malnutrition Characteristics:  Signs/Symptoms: estimated needs   Nutrition Interventions:  Interventions: Refer to RD note for recommendations  RN Pressure Injury Documentation: Pressure Injury 01/13/19 Sacrum Medial Deep Tissue Injury - Purple or maroon localized area of discolored intact skin or blood-filled blister due to damage of underlying soft tissue from pressure and/or shear. (Active)  01/13/19 1227  Location: Sacrum  Location Orientation: Medial  Staging: Deep Tissue Injury - Purple or maroon localized area of discolored intact skin or blood-filled blister due to damage of underlying soft tissue from pressure and/or shear.  Wound Description (Comments):   Present on Admission: No     Consultants:   *  Procedures:     Antimicrobials:       Subjective: Patient feeling well, tolerating po well, no dyspnea or chest pain, continue to be very weak and deconditioned, had a fall yesterday with no head trauma.   Objective: Vitals:   01/21/19 0556 01/21/19 0831 01/21/19 0834 01/21/19 0858  BP: 120/82   109/81  Pulse: 85   97  Resp: 18   18  Temp: 98.3 F (36.8 C)   98.5 F (36.9 C)  TempSrc: Oral   Oral  SpO2: 96% 96% 96% 94%  Weight: 98.6  kg     Height:        Intake/Output Summary (Last 24 hours) at 01/21/2019 1053 Last data filed at  01/21/2019 0600 Gross per 24 hour  Intake 357 ml  Output 1000 ml  Net -643 ml   Filed Weights   01/19/19 2013 01/20/19 0500 01/21/19 0556  Weight: 100.4 kg 100.4 kg 98.6 kg    Examination:   General: Not in pain or dyspnea. Deconditioned  Neurology: Awake and alert, non focal  E ENT: mild pallor, no icterus, oral mucosa moist Cardiovascular: No JVD. S1-S2 present, rhythmic, no gallops, rubs, or murmurs. No lower extremity edema. Pulmonary: positive breath sounds bilaterally, adequate air movement, no wheezing, rhonchi or rales. Gastrointestinal. Abdomen with no organomegaly, non tender, no rebound or guarding Skin. No rashes Musculoskeletal: no joint deformities     Data Reviewed: I have personally reviewed following labs and imaging studies  CBC: Recent Labs  Lab 01/15/19 0405 01/17/19 0558  WBC 9.9 9.0  NEUTROABS  --  5.8  HGB 9.4* 10.4*  HCT 29.3* 32.7*  MCV 85.4 85.8  PLT 195 161   Basic Metabolic Panel: Recent Labs  Lab 01/15/19 2054 01/16/19 0242 01/17/19 0558 01/19/19 0525 01/20/19 0510  NA 140 140 141 140 138  K 3.1* 3.5 3.2* 4.0 4.3  CL 112* 109 110 106 108  CO2 20* 20* 22 23 22   GLUCOSE 120* 120* 104* 141* 138*  BUN 17 16 14 13 17   CREATININE 1.02 0.95 0.99 1.23 1.17  CALCIUM 8.9 8.9 9.1 9.5 9.7  MG 1.9  --  1.9  --   --    GFR: Estimated Creatinine Clearance: 76.8 mL/min (by C-G formula based on SCr of 1.17 mg/dL). Liver Function Tests: No results for input(s): AST, ALT, ALKPHOS, BILITOT, PROT, ALBUMIN in the last 168 hours. No results for input(s): LIPASE, AMYLASE in the last 168 hours. No results for input(s): AMMONIA in the last 168 hours. Coagulation Profile: No results for input(s): INR, PROTIME in the last 168 hours. Cardiac Enzymes: No results for input(s): CKTOTAL, CKMB, CKMBINDEX, TROPONINI in the last 168 hours. BNP (last 3 results) No results for input(s): PROBNP in the last 8760 hours. HbA1C: No results for input(s): HGBA1C in  the last 72 hours. CBG: Recent Labs  Lab 01/20/19 0728 01/20/19 1141 01/20/19 1636 01/20/19 1947 01/21/19 0914  GLUCAP 136* 160* 102* 134* 150*   Lipid Profile: No results for input(s): CHOL, HDL, LDLCALC, TRIG, CHOLHDL, LDLDIRECT in the last 72 hours. Thyroid Function Tests: No results for input(s): TSH, T4TOTAL, FREET4, T3FREE, THYROIDAB in the last 72 hours. Anemia Panel: No results for input(s): VITAMINB12, FOLATE, FERRITIN, TIBC, IRON, RETICCTPCT in the last 72 hours.    Radiology Studies: I have reviewed all of the imaging during this hospital visit personally     Scheduled Meds: . apixaban  5 mg Oral BID  . bictegravir-emtricitabine-tenofovir AF  1 tablet Oral Daily  . budesonide (PULMICORT) nebulizer solution  0.25 mg Nebulization Daily  . Chlorhexidine Gluconate Cloth  6 each Topical Daily  . feeding supplement (GLUCERNA SHAKE)  237 mL Oral TID BM  . gabapentin  600 mg Oral BID  . ipratropium-albuterol  3 mL Nebulization BID  . mouth rinse  15 mL Mouth Rinse BID  . metFORMIN  500 mg Oral BID WC  . metoprolol tartrate  50 mg Oral BID  . multivitamin with minerals  1 tablet Oral Daily  . predniSONE  2.5 mg Oral Q breakfast  .  QUEtiapine  400 mg Oral BID   Continuous Infusions:   LOS: 11 days        Mauricio Gerome Apley, MD

## 2019-01-22 LAB — GLUCOSE, CAPILLARY
Glucose-Capillary: 237 mg/dL — ABNORMAL HIGH (ref 70–99)
Glucose-Capillary: 189 mg/dL — ABNORMAL HIGH (ref 70–99)
Glucose-Capillary: 146 mg/dL — ABNORMAL HIGH (ref 70–99)
Glucose-Capillary: 153 mg/dL — ABNORMAL HIGH (ref 70–99)

## 2019-01-22 MED ORDER — IPRATROPIUM-ALBUTEROL 0.5-2.5 (3) MG/3ML IN SOLN: 3.0000 mL | Freq: Four times a day (QID) | RESPIRATORY_TRACT | Status: DC | PRN

## 2019-01-22 NOTE — Progress Notes (Signed)
PROGRESS NOTE    Jacob Joseph  PFY:[id number] DOB: February 02, 1956 DOA: 01/10/2019 PCP: Neva Seat, MD    Brief Narrative:  63 year old male who presented with tongue swelling after starting lisinopril. He does have significant past medical history for HIV, metastatic prostate cancer, COPD, type 2 diabetes mellitus, hepatitis C, hypertension and history of pulmonary embolism. He presented to [facility] on June 22 for tongue swelling that occurred about an hour after taking lisinopril. He received Benadryl, Solu-Medrol and epinephrine on route to the hospital, his symptoms were persistent and worsening, his airway was compromised and he was nasally intubated in the emergency department. On his physical examination on mechanical ventilation his blood pressure was 108/77, pulse rate 94, respiratory rate 24, oxygen saturation 100%. He had edematous tongue protruding from his mouth,noted to be morbidly obese, his lungswereclear to auscultation, heart S1-S2 present and rhythmic, his abdomen was soft, no lower extremity edema.  He was admitted to the hospital with a working diagnosis of acute respiratory failure due to angioedema due to ACE inhibitors.   Assessment & Plan:   Active Problems:   HIV disease ([place])   Hypertension   COPD, moderate (HCC)   Angioedema   Pressure injury of skin   1. Angioedema induced respiratory failure, hypoxic and hypercapnic acute, due to ace inh. Resolved   2. Severe deconditioning with metabolic encephalopathy in the setting of old CVA bilateral MCA distribution. His encephalopathy has resolved, but continue to be very weak and deconditioned, pending placement at SNF.   3. COPD. No exacerbation continue with inhlaedbudesonide and duonebnebs. Slow taper steroids.   4. Sacrum pressure ulcer, not unable to stage,not present on admission,Local wound care,physical therapy.  5. Obesity. BMI of 33.2. .   6. HIV.Continue with  anti-retroviral.   7. HTN.Continue metoprolol.   8. T2DM. Tolerating well metformin, insulin has been discontinued.  9. History of PE and DVT.Apixaban for anticoagulation.   DVT prophylaxis:apixaban Code Status:full Family Communication:no family at the bedside Disposition Plan/ discharge barriers:pending snf placement   Body mass index is 32.07 kg/m. Malnutrition Type:  Nutrition Problem: Increased nutrient needs Etiology: cancer and cancer related treatments   Malnutrition Characteristics:  Signs/Symptoms: estimated needs   Nutrition Interventions:  Interventions: Refer to RD note for recommendations  RN Pressure Injury Documentation: Pressure Injury 01/13/19 Sacrum Medial Deep Tissue Injury - Purple or maroon localized area of discolored intact skin or blood-filled blister due to damage of underlying soft tissue from pressure and/or shear. (Active)  01/13/19 1227  Location: Sacrum  Location Orientation: Medial  Staging: Deep Tissue Injury - Purple or maroon localized area of discolored intact skin or blood-filled blister due to damage of underlying soft tissue from pressure and/or shear.  Wound Description (Comments):   Present on Admission: No     Consultants:     Procedures:     Antimicrobials:       Subjective: Patient is feeling well, but continue to be very weak and deconditioned, no nausea or vomiting, no chest pain, tolerating po well.   Objective: Vitals:   01/21/19 2110 01/22/19 0459 01/22/19 0514 01/22/19 0859  BP: 118/83  117/74 113/72  Pulse: 93  100 93  Resp: 17  19 18   Temp: 98.6 F (37 C)  98.2 F (36.8 C) 97.9 F (36.6 C)  TempSrc: Oral   Oral  SpO2: 92%  98% 98%  Weight: 98.5 kg 98.5 kg    Height:        Intake/Output Summary (Last 24  hours) at 01/22/2019 1123 Last data filed at 01/22/2019 0843 Gross per 24 hour  Intake 840 ml  Output 2275 ml  Net -1435 ml   Filed Weights   01/21/19 0556 01/21/19 2110  01/22/19 0459  Weight: 98.6 kg 98.5 kg 98.5 kg    Examination:   General: Not in pain or dyspnea  Neurology: Awake and alert, non focal, generalized weakness.   E ENT: no pallor, no icterus, oral mucosa moist Cardiovascular: No JVD. S1-S2 present, rhythmic, no gallops, rubs, or murmurs. No lower extremity edema. Pulmonary: positive breath sounds bilaterally, adequate air movement, no wheezing, rhonchi or rales. Gastrointestinal. Abdomen with no organomegaly, non tender, no rebound or guarding Skin. No rashes Musculoskeletal: no joint deformities     Data Reviewed: I have personally reviewed following labs and imaging studies  CBC: Recent Labs  Lab 01/17/19 0558  WBC 9.0  NEUTROABS 5.8  HGB 10.4*  HCT 32.7*  MCV 85.8  PLT 761   Basic Metabolic Panel: Recent Labs  Lab 01/15/19 2054 01/16/19 0242 01/17/19 0558 01/19/19 0525 01/20/19 0510  NA 140 140 141 140 138  K 3.1* 3.5 3.2* 4.0 4.3  CL 112* 109 110 106 108  CO2 20* 20* 22 23 22   GLUCOSE 120* 120* 104* 141* 138*  BUN 17 16 14 13 17   CREATININE 1.02 0.95 0.99 1.23 1.17  CALCIUM 8.9 8.9 9.1 9.5 9.7  MG 1.9  --  1.9  --   --    GFR: Estimated Creatinine Clearance: 76.7 mL/min (by C-G formula based on SCr of 1.17 mg/dL). Liver Function Tests: No results for input(s): AST, ALT, ALKPHOS, BILITOT, PROT, ALBUMIN in the last 168 hours. No results for input(s): LIPASE, AMYLASE in the last 168 hours. No results for input(s): AMMONIA in the last 168 hours. Coagulation Profile: No results for input(s): INR, PROTIME in the last 168 hours. Cardiac Enzymes: No results for input(s): CKTOTAL, CKMB, CKMBINDEX, TROPONINI in the last 168 hours. BNP (last 3 results) No results for input(s): PROBNP in the last 8760 hours. HbA1C: No results for input(s): HGBA1C in the last 72 hours. CBG: Recent Labs  Lab 01/21/19 1149 01/21/19 1615 01/21/19 2111 01/22/19 0715 01/22/19 1113  GLUCAP 98 116* 124* 237* 189*   Lipid  Profile: No results for input(s): CHOL, HDL, LDLCALC, TRIG, CHOLHDL, LDLDIRECT in the last 72 hours. Thyroid Function Tests: No results for input(s): TSH, T4TOTAL, FREET4, T3FREE, THYROIDAB in the last 72 hours. Anemia Panel: No results for input(s): VITAMINB12, FOLATE, FERRITIN, TIBC, IRON, RETICCTPCT in the last 72 hours.    Radiology Studies: I have reviewed all of the imaging during this hospital visit personally     Scheduled Meds: . apixaban  5 mg Oral BID  . bictegravir-emtricitabine-tenofovir AF  1 tablet Oral Daily  . budesonide (PULMICORT) nebulizer solution  0.25 mg Nebulization Daily  . Chlorhexidine Gluconate Cloth  6 each Topical Daily  . feeding supplement (GLUCERNA SHAKE)  237 mL Oral TID BM  . gabapentin  600 mg Oral BID  . ipratropium-albuterol  3 mL Nebulization BID  . mouth rinse  15 mL Mouth Rinse BID  . metFORMIN  500 mg Oral BID WC  . metoprolol tartrate  50 mg Oral BID  . multivitamin with minerals  1 tablet Oral Daily  . predniSONE  2.5 mg Oral Q breakfast  . QUEtiapine  400 mg Oral BID   Continuous Infusions:   LOS: 12 days        Jimmy Picket  Arrien, MD

## 2019-01-23 LAB — GLUCOSE, CAPILLARY
Glucose-Capillary: 169 mg/dL — ABNORMAL HIGH (ref 70–99)
Glucose-Capillary: 132 mg/dL — ABNORMAL HIGH (ref 70–99)

## 2019-01-23 MED ORDER — METOPROLOL TARTRATE 50 MG PO TABS: 50.0000 mg | ORAL_TABLET | Freq: Two times a day (BID) | ORAL | 0 refills | Status: DC

## 2019-01-23 MED ORDER — PREDNISONE 2.5 MG PO TABS: 2.5000 mg | ORAL_TABLET | Freq: Every day | ORAL | 0 refills | Status: AC

## 2019-01-23 MED ORDER — QUETIAPINE FUMARATE 400 MG PO TABS: 400.0000 mg | ORAL_TABLET | Freq: Two times a day (BID) | ORAL | 0 refills | Status: DC

## 2019-01-23 MED ORDER — APIXABAN 5 MG PO TABS: 5.0000 mg | ORAL_TABLET | Freq: Two times a day (BID) | ORAL | 0 refills | Status: DC

## 2019-01-23 NOTE — Discharge Summary (Addendum)
Physician Discharge Summary  Jacob Joseph KPT:[id number] DOB: 07/08/1956 DOA: 01/10/2019  PCP: Neva Seat, MD  Admit date: 01/10/2019 Discharge date: 01/23/2019  Admitted From: Home  Disposition:  SNF   Recommendations for Outpatient Follow-up and new medication changes:  1. Follow up with Dr. Trilby Drummer in 7 days.  2. Discontinue lisinopril due to angioedema.  3. Patient placed on metoprolol for blood pressure control 4. Continue Seroquel at 400 mg po bid.   Home Health: na    Equipment/Devices: na   Discharge Condition: stable  CODE STATUS:  full  Diet recommendation: heart healthy   Brief/Interim Summary: 63 year old male who presented with tongue swelling after starting lisinopril. He does have significant past medical history for HIV, metastatic prostate cancer, COPD, type 2 diabetes mellitus, hepatitis C, hypertension and history of pulmonary embolism. He presented to [facility] on June 22 for tongue swelling that occurred about an hour after taking lisinopril. He received Benadryl, Solu-Medrol and epinephrine on route to the hospital, his symptoms were persistent and worsening, his airway was compromised and he was nasally intubated in the emergency department. On his initial physical examination while on mechanical ventilation his blood pressure was 108/77, pulse rate 94, respiratory rate 24, oxygen saturation 100%. He had edematous tongue protruding from his mouth, his lungswereclear to auscultation, heart S1-S2 present and rhythmic, his abdomen was soft, no lower extremity edema.  Sodium 140, potassium 3.8, chloride 105, bicarb 21, glucose 235, BUN 17, creatinine 1.45, white count 15.4, hemoglobin 10.6, hematocrit 34.2, platelets 217, SARS COVID-19 was negative, his chest radiograph had bibasilar atelectasis.  EKG 117 bpm, left axis deviation, sinus rhythm with normal intervals, (manually corrected QTC 463), no ST segment or T wave changes.  Patient was admitted  to the hospital with a working diagnosis of acute hypoxic respiratory failure due to angioedema due to ACE inhibitors.  1.  Acute hypoxic respiratory failure due to ACE inhibitor induced angioedema.  Patient was admitted to the intensive care unit, he received systemic IV steroids, H2 blockers, H1 blocker and fresh frozen plasma.  Further work-up with C3-C4 levels were normal.  Patient was liberated from mechanical ventilation June 26, he required post extubation sedation with Precedex, Haldol and Geodon along with noninvasive mechanical ventilation.  He was eventually taper off H1 and H2 blockers, resume his home dose of prednisone.   2.  Severe deconditioning with metabolic encephalopathy in the setting of old CVA, Patient's encephalopathy significantly improved, but patient was noted to be very weak and deconditioned, he was seen by physical therapy with recommendations to continue therapy at a skilled nursing facility.  Further work-up with head CT showed chronic bilateral MCA territory ischemia. Patient will continue with pravastatin and aspirin.  3.  COPD.  No clinical signs of exacerbation, continue bronchodilator therapy as needed.  His systemic steroids have been slowly tapered down, currently on 2.5 mg daily.  Recommendation to continue slow taper.  4.  Sacral pressure ulcer, unable to stage, not present on admission.  Patient had a prolonged hospital stay, he required invasive mechanical ventilation, continue local wound care.  5.  Obesity.  BMI 33.2.  6.  HIV.  Continue retroviral therapy.  7.  Hypertension.  Continue blood pressure control with metoprolol. Discontinue lisinopril.   8.  Type 2 diabetes mellitus.  Initially patient was placed on insulin sliding scale, his systemic steroids were tapered, and he was eventually transitioned to metformin with good toleration.  9.  History of PE and DVT.  Continue  anticoagulation with apixaban.   Discharge Diagnoses:  Active Problems:    HIV disease ([place])   Hypertension   COPD, moderate ([place])   Angioedema   Pressure injury of skin    Discharge Instructions   Allergies as of 01/23/2019      Reactions   Lisinopril Swelling   Statins Other (See Comments)   Elevated liver enzymes.      Medication List    STOP taking these medications   calcium gluconate 500 MG tablet     TAKE these medications   abiraterone acetate 250 MG tablet Commonly known as: ZYTIGA TAKE 4 TABLETS (1,000 MG TOTAL) BY MOUTH DAILY. TAKE ON AN EMPTY STOMACH 1 HOUR BEFORE OR 2 HOURS AFTER A MEAL   apixaban 5 MG Tabs tablet Commonly known as: ELIQUIS Take 1 tablet (5 mg total) by mouth 2 (two) times daily. What changed:   medication strength  how much to take   aspirin 81 MG EC tablet Take 1 tablet (81 mg total) by mouth daily.   bictegravir-emtricitabine-tenofovir AF 50-200-25 MG Tabs tablet Commonly known as: BIKTARVY Take 1 tablet by mouth daily.   cholecalciferol 1000 units tablet Commonly known as: VITAMIN D Take 1 tablet (1,000 Units total) by mouth daily. What changed: how much to take   citalopram 20 MG tablet Commonly known as: CELEXA Take 20 mg by mouth daily.   gabapentin 300 MG capsule Commonly known as: NEURONTIN TAKE 2 CAPSULES(600 MG) BY MOUTH TWICE DAILY What changed: See the new instructions.   glucose blood test strip Commonly known as: OneTouch Verio Use as instructed ICD 10  E11.8, non-insulin dependent, test up to twice daily   metFORMIN 500 MG tablet Commonly known as: GLUCOPHAGE TAKE 1 TABLET BY MOUTH EVERY DAY WITH BREAKFAST What changed: See the new instructions.   metoprolol tartrate 50 MG tablet Commonly known as: LOPRESSOR Take 1 tablet (50 mg total) by mouth 2 (two) times daily.   OneTouch Delica Lancets Fine Misc 1 Stick by Does not apply route 2 (two) times daily. ICD 10  E11.8, non-insulin dependent, test up to twice daily   OneTouch Verio w/Device Kit 1 Device by Does not apply  route 2 (two) times daily. ICD 10  E11.8, non-insulin dependent, test up to twice daily   pravastatin 40 MG tablet Commonly known as: PRAVACHOL TAKE 1 TABLET(40 MG) BY MOUTH DAILY What changed: See the new instructions.   predniSONE 2.5 MG tablet Commonly known as: DELTASONE Take 1 tablet (2.5 mg total) by mouth daily with breakfast for 14 days. Start taking on: January 24, 2019 What changed:   medication strength  See the new instructions.   QUEtiapine 400 MG tablet Commonly known as: SEROQUEL Take 1 tablet (400 mg total) by mouth 2 (two) times daily. What changed:   how much to take  when to take this  Another medication with the same name was removed. Continue taking this medication, and follow the directions you see here.   tamsulosin 0.4 MG Caps capsule Commonly known as: FLOMAX TAKE 1 CAPSULE(0.4 MG) BY MOUTH DAILY AFTER BREAKFAST What changed: See the new instructions.   vitamin E 200 UNIT capsule Take 200 Units by mouth daily.            Durable Medical Equipment  (From admission, onward)         Start     Ordered   01/17/19 1257  For home use only DME 3 n 1  Once  01/17/19 1256   01/17/19 1257  For home use only DME Walker rolling  Once    Question:  Patient needs a walker to treat with the following condition  Answer:  Gait instability   01/17/19 1256         Contact information for after-discharge care    [facility] .   Service: Skilled Nursing Contact information: [place] [phone]             Allergies  Allergen Reactions  . Lisinopril Swelling  . Statins Other (See Comments)    Elevated liver enzymes.    Consultations:     Procedures/Studies: Dg Abd 1 View  Result Date: 01/13/2019 CLINICAL DATA:  Ileus EXAM: ABDOMEN - 1 VIEW COMPARISON:  02/26/2018 abdominal CT FINDINGS: Very limited study due to soft tissue attenuation. The bowel gas  pattern is nonobstructive. No gross mass effect. IMPRESSION: Limited study with normal bowel gas pattern. Electronically Signed   By: Monte Fantasia M.D.   On: 01/13/2019 06:13   Ct Head Wo Contrast  Result Date: 01/20/2019 CLINICAL DATA:  63 year old male with HIV, metastatic prostate cancer, recent angioedema. Right side weakness today. EXAM: CT HEAD WITHOUT CONTRAST TECHNIQUE: Contiguous axial images were obtained from the base of the skull through the vertex without intravenous contrast. COMPARISON:  Head CT 09/22/2016. Brain MRI 11/20/2014. FINDINGS: Brain: Chronic white matter encephalomalacia in the left MCA territory is stable since 2018. So is a small area of cortical encephalomalacia in the left inferior frontal gyrus best demonstrated on coronal image 30. Contralateral right superior frontal gyrus cortical and white matter ischemia is also stable. No midline shift, ventriculomegaly, mass effect, evidence of mass lesion, intracranial hemorrhage or evidence of cortically based acute infarction. Vascular: Calcified atherosclerosis at the skull base. No suspicious intracranial vascular hyperdensity. Skull: No acute osseous abnormality identified. Sinuses/Orbits: Moderate right maxillary sinus mucosal thickening and bubbly opacity in the right sphenoid sinus are new since 2018. Other visible paranasal sinuses, tympanic cavities and mastoids remain well pneumatized. Other: No acute orbit or scalp soft tissue findings. IMPRESSION: 1. Stable non contrast CT appearance of the brain since 2018 with chronic bilateral MCA territory ischemia. 2. Moderate right maxillary and sphenoid sinus inflammation is new since 2018. Electronically Signed   By: Genevie Ann M.D.   On: 01/20/2019 16:02   Dg Chest 1v Repeat Same Day  Result Date: 01/10/2019 CLINICAL DATA:  Exchange of endotracheal tube. EXAM: CHEST - 1 VIEW SAME DAY COMPARISON:  Radiograph earlier day at 0041 hour FINDINGS: Endotracheal tube tip 5.3 cm from the  carina. Lower lung volumes from prior exam. Mild cardiomegaly is unchanged. Unchanged vascular congestion. Streaky bibasilar atelectasis. No pneumothorax. IMPRESSION: 1. Endotracheal tube tip 5.3 cm from the carina. 2. Lower lung volumes with grossly unchanged cardiomegaly and vascular congestion. Electronically Signed   By: Keith Rake M.D.   On: 01/10/2019 01:14   Dg Chest Port 1 View  Result Date: 01/13/2019 CLINICAL DATA:  63 year old male with angioedema, respiratory failure in the ICU. EXAM: PORTABLE CHEST 1 VIEW COMPARISON:  01/12/2019 and earlier. FINDINGS: Portable AP semi upright view at 0522 hours. Stable endotracheal tube tip at the level the clavicles. Continued low lung volumes. No pneumothorax or pulmonary edema. Patchy bibasilar opacity most resembling atelectasis. No definite pleural effusion. Stable cardiac size and mediastinal contours. Negative visible bowel gas pattern. IMPRESSION: 1. Stable ET tube. 2. Continued low lung volumes with atelectasis.  No new cardiopulmonary abnormality. Electronically Signed   By: Genevie Ann M.D.   On: 01/13/2019 09:04   Dg Chest Port 1 View  Result Date: 01/12/2019 CLINICAL DATA:  Acute respiratory failure. EXAM: PORTABLE CHEST 1 VIEW COMPARISON:  Radiograph January 11, 2019. FINDINGS: Stable cardiomediastinal silhouette. Endotracheal tube is in grossly good position. Hypoinflation of the lungs is noted with mild bibasilar subsegmental atelectasis. No pneumothorax or significant pleural effusion is noted. Bony thorax unremarkable. IMPRESSION: Hypoinflation of the lungs is noted with mild bibasilar subsegmental atelectasis. Stable support apparatus. Electronically Signed   By: Marijo Conception M.D.   On: 01/12/2019 07:04   Dg Chest Port 1 View  Result Date: 01/11/2019 CLINICAL DATA:  Endotracheal tube placement. EXAM: PORTABLE CHEST 1 VIEW COMPARISON:  Radiograph of January 10, 2019. FINDINGS: Stable cardiomediastinal silhouette. Endotracheal tube is in  grossly good position. No pneumothorax is noted. Right lung is clear. Mild left basilar atelectasis is noted with minimal left pleural effusion. Bony thorax is unremarkable. IMPRESSION: Endotracheal tube in good position. Mild left basilar atelectasis is noted with minimal left pleural effusion. Electronically Signed   By: Marijo Conception M.D.   On: 01/11/2019 07:24   Dg Chest Portable 1 View  Result Date: 01/10/2019 CLINICAL DATA:  Angioedema. Tube placement. EXAM: PORTABLE CHEST 1 VIEW COMPARISON:  08/31/2018 FINDINGS: Endotracheal tube tip at the thoracic inlet 8.3 cm from the carina. Mild cardiomegaly with unchanged mediastinal contours. Previous right lung base opacities have resolved with mild residual atelectasis or scarring. Linear left lung base opacities likely atelectasis. Central vascular congestion. No pneumothorax or large pleural effusion. IMPRESSION: 1. Endotracheal tube tip at the thoracic inlet 8.3 cm from the carina. 2. Mild cardiomegaly and vascular congestion. Bibasilar atelectasis. Electronically Signed   By: Keith Rake M.D.   On: 01/10/2019 01:13   Vas [place] Upper Extremity Venous Duplex  Result Date: 01/17/2019 UPPER VENOUS STUDY  Indications: Swelling Limitations: Body habitus, patient position and bandages. Performing Technologist: Maudry Mayhew MHA, RDMS, RVT, RDCS  Examination Guidelines: A complete evaluation includes B-mode imaging, spectral Doppler, color Doppler, and power Doppler as needed of all accessible portions of each vessel. Bilateral testing is considered an integral part of a complete examination. Limited examinations for reoccurring indications may be performed as noted.  Right Findings: +----------+------------+---------+-----------+----------+--------------+ RIGHT     CompressiblePhasicitySpontaneousProperties   Summary     +----------+------------+---------+-----------+----------+--------------+ Subclavian                                           Not visualized +----------+------------+---------+-----------+----------+--------------+  Left Findings: +----------+------------+---------+-----------+----------+--------------+ LEFT      CompressiblePhasicitySpontaneousProperties   Summary     +----------+------------+---------+-----------+----------+--------------+ IJV           Full       Yes       Yes                             +----------+------------+---------+-----------+----------+--------------+ Subclavian    Full       Yes       Yes                             +----------+------------+---------+-----------+----------+--------------+ Axillary      Full       Yes       Yes                             +----------+------------+---------+-----------+----------+--------------+  Brachial      Full       Yes       Yes                             +----------+------------+---------+-----------+----------+--------------+ Radial        Full                                                 +----------+------------+---------+-----------+----------+--------------+ Ulnar         Full                                                 +----------+------------+---------+-----------+----------+--------------+ Cephalic      Full                                                 +----------+------------+---------+-----------+----------+--------------+ Basilic                                             Not visualized +----------+------------+---------+-----------+----------+--------------+  Summary:  Left: No evidence of deep vein thrombosis in the upper extremity. No evidence of superficial vein thrombosis in the upper extremity.  *See table(s) above for measurements and observations.  Diagnosing physician: Harold Barban MD Electronically signed by Harold Barban MD on 01/17/2019 at 1:21:54 PM.    Final       Procedures:   Subjective: Patient is feeling better, no dyspnea, no chest pain, no nausea or  vomiting, tolerating po well. Continue to be very weak and deconditioned.   Discharge Exam: Vitals:   01/23/19 0759 01/23/19 0851  BP:  112/66  Pulse: 92 87  Resp: 19 18  Temp:  98.5 F (36.9 C)  SpO2:  96%   Vitals:   01/23/19 0346 01/23/19 0441 01/23/19 0759 01/23/19 0851  BP:  135/87  112/66  Pulse:  87 92 87  Resp:  _0 Temp:  98 F (36.7 C)  98.5 F (36.9 C)  TempSrc:    Oral  SpO2:  96%  96%  Weight: 98.2 kg     Height:        General: Not in pain or dyspnea, deconditioned  Neurology: Awake and alert, non focal  E ENT: no pallor, no icterus, oral mucosa moist, no tongue swelling, no stridor.  Cardiovascular: No JVD. S1-S2 present, rhythmic, no gallops, rubs, or murmurs. No lower extremity edema. Pulmonary: positive breath sounds bilaterally, adequate air movement, no wheezing, rhonchi or rales. Gastrointestinal. Abdomen with no organomegaly, non tender, no rebound or guarding Skin. No rashes Musculoskeletal: no joint deformities   The results of significant diagnostics from this hospitalization (including imaging, microbiology, ancillary and laboratory) are listed below for reference.     Microbiology: No results found for this or any previous visit (from the past 240 hour(s)).   Labs: BNP (last 3 results) No results for input(s): BNP in the last 8760 hours. Basic Metabolic Panel: Recent  Labs  Lab 01/17/19 0558 01/19/19 0525 01/20/19 0510  NA 141 140 138  K 3.2* 4.0 4.3  CL 110 106 108  CO2 _0 GLUCOSE 104* 141* 138*  BUN _1 CREATININE 0.99 1.23 1.17  CALCIUM 9.1 9.5 9.7  MG 1.9  --   --    Liver Function Tests: No results for input(s): AST, ALT, ALKPHOS, BILITOT, PROT, ALBUMIN in the last 168 hours. No results for input(s): LIPASE, AMYLASE in the last 168 hours. No results for input(s): AMMONIA in the last 168 hours. CBC: Recent Labs  Lab 01/17/19 0558  WBC 9.0  NEUTROABS 5.8  HGB 10.4*  HCT 32.7*  MCV 85.8  PLT 200    Cardiac Enzymes: No results for input(s): CKTOTAL, CKMB, CKMBINDEX, TROPONINI in the last 168 hours. BNP: Invalid input(s): POCBNP CBG: Recent Labs  Lab 01/22/19 0715 01/22/19 1113 01/22/19 1610 01/22/19 2020 01/23/19 0703  GLUCAP 237* 189* 146* 153* 169*   D-Dimer No results for input(s): DDIMER in the last 72 hours. Hgb A1c No results for input(s): HGBA1C in the last 72 hours. Lipid Profile No results for input(s): CHOL, HDL, LDLCALC, TRIG, CHOLHDL, LDLDIRECT in the last 72 hours. Thyroid function studies No results for input(s): TSH, T4TOTAL, T3FREE, THYROIDAB in the last 72 hours.  Invalid input(s): FREET3 Anemia work up No results for input(s): VITAMINB12, FOLATE, FERRITIN, TIBC, IRON, RETICCTPCT in the last 72 hours. Urinalysis    Component Value Date/Time   COLORURINE YELLOW 08/31/2018 0805   APPEARANCEUR HAZY (A) 08/31/2018 0805   LABSPEC 1.013 08/31/2018 0805   PHURINE 6.0 08/31/2018 0805   GLUCOSEU NEGATIVE 08/31/2018 0805   HGBUR SMALL (A) 08/31/2018 0805   HGBUR negative 12/25/2008 1206   BILIRUBINUR NEGATIVE 08/31/2018 0805   BILIRUBINUR negative 06/19/2016 1000   KETONESUR NEGATIVE 08/31/2018 0805   PROTEINUR 30 (A) 08/31/2018 0805   UROBILINOGEN 0.2 06/19/2016 1000   UROBILINOGEN 1.0 11/19/2014 1650   NITRITE NEGATIVE 08/31/2018 0805   LEUKOCYTESUR NEGATIVE 08/31/2018 0805   Sepsis Labs Invalid input(s): PROCALCITONIN,  WBC,  LACTICIDVEN Microbiology No results found for this or any previous visit (from the past 240 hour(s)).   Time coordinating discharge: 45 minutes  SIGNED:   Tawni Millers, MD  [facility] 01/23/2019, 10:39 AM

## 2019-01-23 NOTE — Telephone Encounter (Signed)
Refuse as medication was discontinued

## 2019-01-23 NOTE — Progress Notes (Signed)
[name] to be discharged to [facility]  per MD order. Patient verbalized understanding.  Skin clean, dry and intact without evidence of skin break down, no evidence of skin tears noted. IV catheter discontinued intact. Site without signs and symptoms of complications. Dressing and pressure applied. Pt denies pain at the site currently. No complaints noted.  Patient free of lines, drains, and wounds. Called report to Lezlie Lye RN  Discharge packet assembled. An After Visit Summary (AVS) was printed and given to the EMS personnel. Patient escorted via stretcher and discharged to [facility] via ambulance. Report called to accepting facility; all questions and concerns addressed.   Shela Commons, RN

## 2019-01-23 NOTE — TOC Transition Note (Signed)
Transition of Care [facility]) - CM/SW Discharge Note   Patient Details  Name: Jacob Joseph MRN: [id number] Date of Birth: 1955/10/06  [facility] CM/SW Contact:  Sable Feil, LCSW Phone Number: 01/23/2019, 2:55 PM   Clinical Narrative: Patient medically stable for discharge today and will transfer to [facility] in [place]. Facility admissions director advised of patient's readiness for discharge and d/c clinicals transmitted to facility. Wife, Jacob Joseph called and informed of today's discharge and ambulance transport.      Final next level of care: [place], [place] Barriers to Discharge: No Barriers Identified   Patient Goals and CMS Choice   CMS Medicare.gov Compare Post Acute Care list provided to:: Other (Comment Required)(Talked with wife by phone and advised her of [email or web address]) Choice offered to / list presented to : Spouse(Wife advised of Medicare.gov by phone)  Discharge Placement [facility], [facility] number recieved: 01/22/19([phone] F, eff. 7/5 - 02/21/19)              Patient to be transferred to facility by: Ambulance Name of family member notified: Jacob Joseph - wfie by phone - [phone] Patient and family notified of of transfer: 01/23/19  Discharge Plan and Services In-house Referral: Clinical Social Work  Patient will discharge to [facility] in [place] ([place]) Interventions  No SDOH interventions needed  Admission Risk Interventions No flowsheet data found.

## 2019-01-25 ENCOUNTER — Telehealth: Payer: Self-pay

## 2019-01-25 NOTE — Telephone Encounter (Signed)
Received message from the patient stating he needs to reschedule his appointment for next week since he has been dc'd from the hospital to a rehab facility. Contacted patient and made him aware that some rehab facilities will provide transportation to appt's. Patient checked with facility staff and was told that they will provide transportation to his 7/16 appt's.

## 2019-01-25 NOTE — Telephone Encounter (Signed)
Oral Oncology Patient Advocate Encounter  [facility] has attempted to reach the patient 3 times to refill his Zytiga with no success.   I called today and spoke with his wife [name]. We scheduled his Zytiga to be filled and shipped to them on 7/9.   [name] also informed me that the patient had a severe allergic reaction to Lisinopril and is now on metoprolol. I updated this information in our pharmacy system.  [place] Patient [place] Phone [phone] Fax [phone] 01/25/2019   2:18 PM

## 2019-01-26 ENCOUNTER — Encounter: Payer: 59 | Admitting: Internal Medicine

## 2019-01-26 MED FILL — ABIRATERONE ACETATE 250 MG: 250 | 30 days supply | Qty: 120 | Fill #0

## 2019-01-27 DIAGNOSIS — G9341 Metabolic encephalopathy: Secondary | ICD-10-CM | POA: Diagnosis not present

## 2019-01-27 DIAGNOSIS — D649 Anemia, unspecified: Secondary | ICD-10-CM | POA: Diagnosis not present

## 2019-01-27 DIAGNOSIS — D519 Vitamin B12 deficiency anemia, unspecified: Secondary | ICD-10-CM | POA: Diagnosis not present

## 2019-02-01 ENCOUNTER — Telehealth: Payer: Self-pay | Admitting: Internal Medicine

## 2019-02-01 ENCOUNTER — Encounter: Payer: Self-pay | Admitting: General Practice

## 2019-02-01 DIAGNOSIS — E119 Type 2 diabetes mellitus without complications: Secondary | ICD-10-CM | POA: Diagnosis not present

## 2019-02-01 DIAGNOSIS — D649 Anemia, unspecified: Secondary | ICD-10-CM | POA: Diagnosis not present

## 2019-02-01 NOTE — Progress Notes (Signed)
[place] CSW Progress Notes  Two calls from patient this morning, both calls returned, left VM.  Noted this patient's assigned CSW would be [place], message sent to [place].  Edwyna Shell, LCSW Clinical Social Worker Phone:  [phone]

## 2019-02-01 NOTE — Telephone Encounter (Signed)
Patient reached out to me on 7/15 at 08:26AM to advise he will not need transportation through our services because he has his own transportation for tomorrows appointment.

## 2019-02-01 NOTE — Telephone Encounter (Signed)
Tried contacting PT again and no answer. I left a VM

## 2019-02-01 NOTE — Telephone Encounter (Signed)
Tried to contact pt for transportation confirmation for tomorrow but no answer. I left a VM. Without confirmation transportation can not be confirmed

## 2019-02-02 ENCOUNTER — Inpatient Hospital Stay: Payer: 59

## 2019-02-02 ENCOUNTER — Other Ambulatory Visit: Payer: Self-pay

## 2019-02-02 ENCOUNTER — Telehealth: Payer: Self-pay | Admitting: Internal Medicine

## 2019-02-02 ENCOUNTER — Telehealth: Payer: Self-pay | Admitting: Oncology

## 2019-02-02 ENCOUNTER — Inpatient Hospital Stay: Payer: 59 | Attending: Oncology | Admitting: Oncology

## 2019-02-02 VITALS — BP 141/97 | HR 102 | Temp 98.6°F | Resp 18 | Ht 69.0 in | Wt 226.1 lb

## 2019-02-02 DIAGNOSIS — C61 Malignant neoplasm of prostate: Secondary | ICD-10-CM

## 2019-02-02 DIAGNOSIS — Z7982 Long term (current) use of aspirin: Secondary | ICD-10-CM | POA: Insufficient documentation

## 2019-02-02 DIAGNOSIS — E785 Hyperlipidemia, unspecified: Secondary | ICD-10-CM | POA: Insufficient documentation

## 2019-02-02 DIAGNOSIS — Z79899 Other long term (current) drug therapy: Secondary | ICD-10-CM | POA: Insufficient documentation

## 2019-02-02 DIAGNOSIS — E119 Type 2 diabetes mellitus without complications: Secondary | ICD-10-CM | POA: Diagnosis not present

## 2019-02-02 DIAGNOSIS — B20 Human immunodeficiency virus [HIV] disease: Secondary | ICD-10-CM | POA: Diagnosis not present

## 2019-02-02 DIAGNOSIS — Z7984 Long term (current) use of oral hypoglycemic drugs: Secondary | ICD-10-CM | POA: Diagnosis not present

## 2019-02-02 DIAGNOSIS — Z7901 Long term (current) use of anticoagulants: Secondary | ICD-10-CM | POA: Insufficient documentation

## 2019-02-02 DIAGNOSIS — C7951 Secondary malignant neoplasm of bone: Secondary | ICD-10-CM

## 2019-02-02 DIAGNOSIS — Z923 Personal history of irradiation: Secondary | ICD-10-CM | POA: Diagnosis not present

## 2019-02-02 DIAGNOSIS — I10 Essential (primary) hypertension: Secondary | ICD-10-CM

## 2019-02-02 DIAGNOSIS — E876 Hypokalemia: Secondary | ICD-10-CM | POA: Diagnosis not present

## 2019-02-02 LAB — CMP (CANCER CENTER ONLY)
ALT: 11 U/L (ref 0–44)
AST: 12 U/L — ABNORMAL LOW (ref 15–41)
Albumin: 3.2 g/dL — ABNORMAL LOW (ref 3.5–5.0)
Alkaline Phosphatase: 103 U/L (ref 38–126)
Anion gap: 10 (ref 5–15)
BUN: 13 mg/dL (ref 8–23)
CO2: 21 mmol/L — ABNORMAL LOW (ref 22–32)
Calcium: 9.3 mg/dL (ref 8.9–10.3)
Chloride: 110 mmol/L (ref 98–111)
Creatinine: 1.21 mg/dL (ref 0.61–1.24)
GFR, Est AFR Am: >60 mL/min (ref >60)
GFR, Estimated: >60 mL/min (ref >60)
Glucose, Bld: 135 mg/dL — ABNORMAL HIGH (ref 70–99)
Potassium: 3.9 mmol/L (ref 3.5–5.1)
Sodium: 141 mmol/L (ref 135–145)
Total Bilirubin: <0.2 mg/dL — ABNORMAL LOW (ref 0.3–1.2)
Total Protein: 7.8 g/dL (ref 6.5–8.1)

## 2019-02-02 LAB — CBC WITH DIFFERENTIAL (CANCER CENTER ONLY)
Abs Immature Granulocytes: 0.02 10*3/uL (ref 0.00–0.07)
Basophils Absolute: 0 10*3/uL (ref 0.0–0.1)
Basophils Relative: 0 %
Eosinophils Absolute: 0.2 10*3/uL (ref 0.0–0.5)
Eosinophils Relative: 3 %
HCT: 31 % — ABNORMAL LOW (ref 39.0–52.0)
Hemoglobin: 9.8 g/dL — ABNORMAL LOW (ref 13.0–17.0)
Immature Granulocytes: 0 %
Lymphocytes Relative: 14 %
Lymphs Abs: 1 10*3/uL (ref 0.7–4.0)
MCH: 27.1 pg (ref 26.0–34.0)
MCHC: 31.6 g/dL (ref 30.0–36.0)
MCV: 85.6 fL (ref 80.0–100.0)
Monocytes Absolute: 0.5 10*3/uL (ref 0.1–1.0)
Monocytes Relative: 7 %
Neutro Abs: 5.4 10*3/uL (ref 1.7–7.7)
Neutrophils Relative %: 76 %
Platelet Count: 282 10*3/uL (ref 150–400)
RBC: 3.62 MIL/uL — ABNORMAL LOW (ref 4.22–5.81)
RDW: 13.8 % (ref 11.5–15.5)
WBC Count: 7.1 10*3/uL (ref 4.0–10.5)
nRBC: 0 % (ref 0.0–0.2)

## 2019-02-02 NOTE — Telephone Encounter (Signed)
Scheduled appt per 7/16 los - gave patient AVS and calender print out

## 2019-02-02 NOTE — Telephone Encounter (Signed)
I have tried to contact patient to set up transportation and no answer. VMs have been left. I will try again before his appointment

## 2019-02-02 NOTE — Progress Notes (Signed)
Hematology and Oncology Follow Up Visit  Jacob Joseph [id number] November 04, 1955 63 y.o. 02/02/2019 1:37 PM Jacob Joseph, Alexander, MD   Principle Diagnosis: 63 year old man with advanced prostate cancer with disease to the bone diagnosed in 2017.  He has castration-resistant after presenting in 2017 with a PSA of 23.1 at the time of diagnosis.   Prior Therapy: He was treated with Lupron under the care of [name] every 4 months since this time of diagnosis. He is status post radiation therapy for a total of 30 Gy in 10 fractions to be completed in September 2019 to the thoracic spine.  Current therapy: Zytiga 1000 mg daily with prednisone at 5 mg daily started in August 2019.   Interim History: [name] returns today for a follow-up.  Since last visit, he was hospitalized between January 10, 2019 and July 6 for respiratory failure related to angioedema.  He was discharged to a skilled nursing facility at that time.  Since his discharge, he spent 1 week at at skilled nursing facility and currently at home.  He is ambulating without any major difficulties and has regained most activities of daily living.  He denies any shortness of breath cough or wheezing.  He is currently taking Zytiga without any complaints.  He denies any worsening edema or excessive fatigue.  He denies any recent bone pain or pathological fractures.   He denied headaches, blurry vision, syncope or seizures.  Denies any fevers, chills or sweats.  Denied chest pain, palpitation, orthopnea or leg edema.  Denied cough, wheezing or hemoptysis.  Denied nausea, vomiting or abdominal pain.  Denies any constipation or diarrhea.  Denies any frequency urgency or hesitancy.  Denies any arthralgias or myalgias.  Denies any skin rashes or lesions.  Denies any bleeding or clotting tendency.  Denies any easy bruising.  Denies any hair or nail changes.  Denies any anxiety or depression.  Remaining review of system is  negative.         Medications: Updated today without any changes. Current Outpatient Medications  Medication Sig Dispense Refill  . abiraterone acetate (ZYTIGA) 250 MG tablet TAKE 4 TABLETS (1,000 MG TOTAL) BY MOUTH DAILY. TAKE ON AN EMPTY STOMACH 1 HOUR BEFORE OR 2 HOURS AFTER A MEAL 120 tablet 0  . apixaban (ELIQUIS) 5 MG TABS tablet Take 1 tablet (5 mg total) by mouth 2 (two) times daily. 60 tablet 0  . aspirin 81 MG EC tablet Take 1 tablet (81 mg total) by mouth daily. 30 tablet 5  . bictegravir-emtricitabine-tenofovir AF (BIKTARVY) 50-200-25 MG TABS tablet Take 1 tablet by mouth daily. 30 tablet 11  . Blood Glucose Monitoring Suppl (ONETOUCH VERIO) w/Device KIT 1 Device by Does not apply route 2 (two) times daily. ICD 10  E11.8, non-insulin dependent, test up to twice daily 1 kit 0  . cholecalciferol (VITAMIN D) 1000 units tablet Take 1 tablet (1,000 Units total) by mouth daily. (Patient taking differently: Take 2,000 Units by mouth daily. ) 90 tablet 3  . citalopram (CELEXA) 20 MG tablet Take 20 mg by mouth daily.    [name] Kitchen gabapentin (NEURONTIN) 300 MG capsule TAKE 2 CAPSULES(600 MG) BY MOUTH TWICE DAILY (Patient taking differently: Take 600 mg by mouth 2 (two) times daily. ) 120 capsule 5  . glucose blood (ONETOUCH VERIO) test strip Use as instructed ICD 10  E11.8, non-insulin dependent, test up to twice daily 100 each 11  . metFORMIN (GLUCOPHAGE) 500 MG tablet TAKE 1 TABLET BY MOUTH EVERY DAY  WITH BREAKFAST (Patient taking differently: Take 500 mg by mouth daily with breakfast. ) 90 tablet 1  . metoprolol tartrate (LOPRESSOR) 50 MG tablet Take 1 tablet (50 mg total) by mouth 2 (two) times daily. 60 tablet 0  . ONETOUCH DELICA LANCETS FINE MISC 1 Stick by Does not apply route 2 (two) times daily. ICD 10  E11.8, non-insulin dependent, test up to twice daily 100 each 11  . pravastatin (PRAVACHOL) 40 MG tablet TAKE 1 TABLET(40 MG) BY MOUTH DAILY (Patient taking differently: Take 40 mg by  mouth daily. ) 90 tablet 3  . predniSONE (DELTASONE) 2.5 MG tablet Take 1 tablet (2.5 mg total) by mouth daily with breakfast for 14 days. 14 tablet 0  . QUEtiapine (SEROQUEL) 400 MG tablet Take 1 tablet (400 mg total) by mouth 2 (two) times daily. 60 tablet 0  . tamsulosin (FLOMAX) 0.4 MG CAPS capsule TAKE 1 CAPSULE(0.4 MG) BY MOUTH DAILY AFTER BREAKFAST (Patient taking differently: Take 0.4 mg by mouth daily after breakfast. ) 90 capsule 3  . vitamin E 200 UNIT capsule Take 200 Units by mouth daily.     No current facility-administered medications for this visit.      Allergies:  Allergies  Allergen Reactions  . Lisinopril Swelling  . Statins Other (See Comments)    Elevated liver enzymes.    Past Medical History, Surgical history, Social history, and Family History remains without changes on review today.   Physical Exam:  Blood pressure (!) 141/97, pulse (!) 102, temperature 98.6 F (37 C), temperature source Oral, resp. rate 18, height 5' 9" (1.753 m), weight 226 lb 1.6 oz (102.6 kg), SpO2 99 %.    ECOG: 1    General appearance: Comfortable appearing without any discomfort Head: Normocephalic without any trauma Oropharynx: Mucous membranes are moist and pink without any thrush or ulcers. Eyes: Pupils are equal and round reactive to light. Lymph nodes: No cervical, supraclavicular, inguinal or axillary lymphadenopathy.   Heart:regular rate and rhythm.  S1 and S2 without leg edema. Lung: Clear without any rhonchi or wheezes.  No dullness to percussion. Abdomin: Soft, nontender, nondistended with good bowel sounds.  No hepatosplenomegaly. Musculoskeletal: No joint deformity or effusion.  Full range of motion noted. Neurological: No deficits noted on motor, sensory and deep tendon reflex exam. Skin: No petechial rash or dryness.  Appeared moist.  Psychiatric: Mood and affect appeared appropriate.      Lab Results: Lab Results  Component Value Date   WBC 9.0  01/17/2019   HGB 10.4 (L) 01/17/2019   HCT 32.7 (L) 01/17/2019   MCV 85.8 01/17/2019   PLT 200 01/17/2019     Chemistry      Component Value Date/Time   NA 138 01/20/2019 0510   NA 140 01/27/2016 1527   K 4.3 01/20/2019 0510   CL 108 01/20/2019 0510   CO2 22 01/20/2019 0510   BUN 17 01/20/2019 0510   BUN 14 01/27/2016 1527   CREATININE 1.17 01/20/2019 0510   CREATININE 1.23 09/30/2018 1343   CREATININE 1.06 08/24/2018 0919      Component Value Date/Time   CALCIUM 9.7 01/20/2019 0510   ALKPHOS 97 09/30/2018 1343   AST 12 (L) 09/30/2018 1343   ALT 9 09/30/2018 1343   BILITOT <0.2 (L) 09/30/2018 1343       Results for JAMEAR, CARBONNEAU (MRN [id number]) as of 02/02/2019 13:15  Ref. Range 04/12/2018 15:05 07/01/2018 14:45 09/30/2018 13:43  Prostate Specific Ag, Serum Latest Ref Range: 0.0 -  4.0 ng/mL 4.7 (H) 4.7 (H) 5.5 (H)       Impression and Plan:  63 year old man with the:  1.    Castration-resistant prostate cancer documented in 2019.      He remains on Zytiga without any major complications.  His PSA showed an initial reasonable response although slightly elevated March 2020.  The natural course of this disease and treatment options were reviewed today.  Different salvage options includes Xofigo and systemic chemotherapy were reiterated.  At this time I recommended continuing Zytiga monitoring his counts periodically.  Repeat imaging studies will be needed if his PSA continues to rise.   2.  Androgen deprivation: Long-term complication associated with this therapy was reviewed.  I recommended continuing this indefinitely he is currently receiving it under the care of [name].  3.  Thoracic spine metastasis: No bone pain reported at this time.  Status post radiation therapy to his thoracic spine.  4.  Bone directed therapy: He continues to be on vitamin D and calcium supplements.  Delton See is deferred for the time being until he obtains dental clearance.  5.   Prognosis and goals of care: His disease is incurable although aggressive measures are warranted given his reasonable performance status.  6.  HIV: Stable on his medication follows by Dr. Megan Salon  7.  Hypertension and hypokalemia: These are complications related to Zytiga.  His potassium on January 20, 2019 was within normal range.  His blood pressure is mildly elevated today although has been under better control.  8.  Follow-up: 3 months for repeat evaluation.   25 minutes was spent with the patient face-to-face today.  More than 50% of time was spent on reviewing his disease status, reviewing laboratory data, treatment options as well as dealing with complications related to chemotherapy.      Zola Button, MD 7/16/20201:37 PM

## 2019-02-03 LAB — PROSTATE-SPECIFIC AG, SERUM (LABCORP): Prostate Specific Ag, Serum: 10.5 ng/mL — ABNORMAL HIGH (ref 0.0–4.0)

## 2019-02-09 ENCOUNTER — Other Ambulatory Visit: Payer: Self-pay

## 2019-02-09 ENCOUNTER — Ambulatory Visit (INDEPENDENT_AMBULATORY_CARE_PROVIDER_SITE_OTHER): Payer: 59 | Admitting: Internal Medicine

## 2019-02-09 ENCOUNTER — Encounter: Payer: Self-pay | Admitting: Internal Medicine

## 2019-02-09 VITALS — BP 148/93 | HR 81 | Temp 98.9°F | Wt 228.1 lb

## 2019-02-09 DIAGNOSIS — J449 Chronic obstructive pulmonary disease, unspecified: Secondary | ICD-10-CM | POA: Diagnosis not present

## 2019-02-09 DIAGNOSIS — T783XXD Angioneurotic edema, subsequent encounter: Secondary | ICD-10-CM

## 2019-02-09 DIAGNOSIS — I10 Essential (primary) hypertension: Secondary | ICD-10-CM | POA: Diagnosis not present

## 2019-02-09 DIAGNOSIS — I82431 Acute embolism and thrombosis of right popliteal vein: Secondary | ICD-10-CM

## 2019-02-09 DIAGNOSIS — E118 Type 2 diabetes mellitus with unspecified complications: Secondary | ICD-10-CM

## 2019-02-09 DIAGNOSIS — E119 Type 2 diabetes mellitus without complications: Secondary | ICD-10-CM | POA: Diagnosis not present

## 2019-02-09 DIAGNOSIS — Z79899 Other long term (current) drug therapy: Secondary | ICD-10-CM

## 2019-02-09 DIAGNOSIS — Z7901 Long term (current) use of anticoagulants: Secondary | ICD-10-CM

## 2019-02-09 DIAGNOSIS — Z7984 Long term (current) use of oral hypoglycemic drugs: Secondary | ICD-10-CM

## 2019-02-09 DIAGNOSIS — Z86718 Personal history of other venous thrombosis and embolism: Secondary | ICD-10-CM

## 2019-02-09 DIAGNOSIS — Z888 Allergy status to other drugs, medicaments and biological substances status: Secondary | ICD-10-CM

## 2019-02-09 DIAGNOSIS — Z7951 Long term (current) use of inhaled steroids: Secondary | ICD-10-CM

## 2019-02-09 MED ORDER — METFORMIN HCL 500 MG PO TABS: 500.0000 mg | ORAL_TABLET | Freq: Two times a day (BID) | ORAL | 2 refills | Status: DC

## 2019-02-09 MED ORDER — APIXABAN 2.5 MG PO TABS: 2.5000 mg | ORAL_TABLET | Freq: Two times a day (BID) | ORAL | 3 refills | Status: DC

## 2019-02-09 NOTE — Assessment & Plan Note (Signed)
Recent admission for angioedema. Believed to be due to ACE-I therapy. Medication was discontinued and allergy has been added to his med list.

## 2019-02-09 NOTE — Assessment & Plan Note (Addendum)
A1c 7.0 when checked 1 months ago during hospitalization. This is up from 6.0 about 7 months prior. Will increase Metformin to 500mg  BID and recheck at follow. Will also get Urine sample for MicroAlb/Cr today (though no longer candidate for ACE-I and likely ARB considering recent Severe Angioedema). - Meformin 500mg  BID - MicroAlbumin/Cr  ADDENDUM: MicroAlbumin/Cr mildly elevated at 95. Will send letter to patient and plan for repeat in 3-6 months to confirm.

## 2019-02-09 NOTE — Assessment & Plan Note (Signed)
Patient has history of recurrent DVT/PE on anticoagulation. He is currently taking Eliquis. He was meant to be taking 2.5mg  BID based on possible interactions with his HIV medications. His last refill was provided by inpatient team after recent admission and was refilled for 5mg  BID, will reduce this back to the appropriate dose of 2.5mg  BID. Patient reports no bleeding events. - Eliquis 2.5mg  BID Indefinately

## 2019-02-09 NOTE — Patient Instructions (Addendum)
Thank you for allowing [place] to care for you  For your blood pressure - This was elevated today - We will wait on the results of your urine study before making adjustments  For your history of blood clots - Refill of Eliquis 2.5mg  Twice a day  For your COPD - Continue current medications  For your Diabetes - Increase Metformin to twice a day - Checking Urine today  Please follow up with your provided in about 3 months, whether this is here or in [place]. Call us if you need records.

## 2019-02-09 NOTE — Assessment & Plan Note (Signed)
Patient has a history of well controlled COPD. Currently taking Stiolto and Budesonide with PRN Albuterol.  Flovent tried in the past but this interacts with his HIV meds. - Tiotropium-Olodaterol (STIOLTO) 2.5-2.5, 2 puff Daily - Budesonide, 1-2 puffs BID - Albuterol PRN

## 2019-02-09 NOTE — Progress Notes (Signed)
   CC: HTN, Hospital follow up for angioedema, COPD, Diabetes, DVT  HPI:  [name] is a 63 y.o. M with PMHx listed below presenting for HTN, Hospital follow up for angioedema, COPD, Diabetes, DVT. Please see the A&P for the status of the patient's chronic medical problems.  Past Medical History:  Diagnosis Date  . AKI (acute kidney injury) ([place]) 09/24/2016  . Calcium oxalate crystals in urine 12/23/2011   Asymptomatic, no hematuria. Advised to take plenty of water.  . Calculus of gallbladder without cholecystitis without obstruction   . COPD (chronic obstructive pulmonary disease) ([place])   . Depression   . Diabetes mellitus without complication ([place]) 6378   pre diabetic  . Hepatitis C   . HIV (human immunodeficiency virus infection) ([place])   . Hypertension   . PE (pulmonary thromboembolism) ([place]) 05/12/2016   PE in 2017, on chronic anticoagulation for this and recurrent DVT.  [name] Kitchen Peripheral arterial disease ([place])    ooccluded left SFA by duplex ultrasound, tibial disease left greater than right  . Prostate cancer metastatic to multiple sites [facility])   . Stroke ([place]) 11/2011   Carotids Doppler negative. Right sided weakness resolved, initially on Plavix for 3 months and then continued with ASA.    . TB lung, latent 1988   Treated   Review of Systems: Reports some chronic back pain. Otherwise, ROS performed and all others negative.  Physical Exam:  Vitals:   02/09/19 1334 02/09/19 1429  BP: (!) 159/87 (!) 148/93  Pulse: 89 81  Temp: 98.9 F (37.2 C)   TempSrc: Oral   SpO2: 99%   Weight: 228 lb 1.6 oz (103.5 kg)    Physical Exam Constitutional:      General: He is not in acute distress.    Appearance: Normal appearance.  Cardiovascular:     Rate and Rhythm: Normal rate and regular rhythm.     Pulses: Normal pulses.     Heart sounds: Normal heart sounds.  Pulmonary:     Effort: Pulmonary effort is normal. No respiratory distress.     Breath sounds: Normal breath  sounds.  Abdominal:     General: Bowel sounds are normal. There is no distension.     Palpations: Abdomen is soft.     Tenderness: There is no abdominal tenderness.  Musculoskeletal:        General: No swelling or deformity.  Skin:    General: Skin is warm and dry.  Neurological:     General: No focal deficit present.     Mental Status: Mental status is at baseline.    Assessment & Plan:   See Encounters Tab for problem based charting.  Patient discussed with Dr. Dareen Piano

## 2019-02-09 NOTE — Assessment & Plan Note (Addendum)
BP today 148/93. Patient previously on Lisinopril but was admitted 01/10/2019 - 01/23/2019 with angio edema attributed to ACE-I use. He was discharged on Metoprolol 50mg  BID. Will follow up on Microalbumin/Cr ratio and prescribe Dilt/Verapamil if elevated and HCTZ if not. - Metoprolol 50mg  BID. - Additional agent will depend on result of urine studies

## 2019-02-10 ENCOUNTER — Telehealth: Payer: Self-pay | Admitting: Internal Medicine

## 2019-02-10 LAB — MICROALBUMIN / CREATININE URINE RATIO
Creatinine, Urine: 176.1 mg/dL
Microalb/Creat Ratio: 95 mg/g creat — ABNORMAL HIGH (ref 0–29)
Microalbumin, Urine: 166.6 ug/mL

## 2019-02-10 NOTE — Telephone Encounter (Signed)
Pt calls and states he forgot to tell dr Trilby Drummer that his skin is peeling off from bil hips down his thighs, states it is not red, rash like or itchy or painful, just skin is peeling. He states he cannot come back in til thurs of next week due to transportation appt made for [facility] thurs 7/30 at 1315. He will call mon and give update. He is cautioned if he becomes worse to go to ED or urg care and is agreeable

## 2019-02-10 NOTE — Telephone Encounter (Signed)
Pt requesting a call back [phone]

## 2019-02-13 ENCOUNTER — Other Ambulatory Visit: Payer: Self-pay | Admitting: Internal Medicine

## 2019-02-13 NOTE — Addendum Note (Signed)
Addended by: Aldine Contes on: 02/13/2019 10:23 AM   Modules accepted: Level of Service

## 2019-02-13 NOTE — Progress Notes (Signed)
Internal Medicine Clinic Attending  Case discussed with Dr. Melvin  at the time of the visit.  We reviewed the resident's history and exam and pertinent patient test results.  I agree with the assessment, diagnosis, and plan of care documented in the resident's note.  

## 2019-02-13 NOTE — Addendum Note (Signed)
Addended by: Aldine Contes on: 02/13/2019 10:11 AM   Modules accepted: Level of Service

## 2019-02-13 NOTE — Telephone Encounter (Signed)
Refill approved.

## 2019-02-15 ENCOUNTER — Other Ambulatory Visit: Payer: Self-pay | Admitting: *Deleted

## 2019-02-15 DIAGNOSIS — B20 Human immunodeficiency virus [HIV] disease: Secondary | ICD-10-CM

## 2019-02-16 ENCOUNTER — Encounter: Payer: 59 | Admitting: Internal Medicine

## 2019-02-17 ENCOUNTER — Telehealth: Payer: Self-pay | Admitting: Internal Medicine

## 2019-02-17 ENCOUNTER — Other Ambulatory Visit: Payer: Self-pay | Admitting: Internal Medicine

## 2019-02-17 NOTE — Telephone Encounter (Signed)
Please call the pharmacy back. Pt is allergic to this medication that was prescribed while inpatient.  Please speak with Crystal back dial [phone] as soon the automated message comes on and ask for her. She will be at the pharmacy until 4pm

## 2019-02-17 NOTE — Telephone Encounter (Signed)
Refill approved, thank you.

## 2019-02-17 NOTE — Telephone Encounter (Signed)
Spoke with Jacob Joseph. She understands lisinopril was d/c at hosp discharge on 01/10/2019 and patient was switched to metoprolol. Jacob Joseph is calling to request refill on metoprolol. This order is pending through [place]. Hubbard Hartshorn, RN, BSN

## 2019-02-20 ENCOUNTER — Encounter: Payer: Self-pay | Admitting: *Deleted

## 2019-02-20 ENCOUNTER — Encounter: Payer: Self-pay | Admitting: Internal Medicine

## 2019-02-20 IMAGING — CR DG ELBOW COMPLETE 3+V*L*
3 series · 3 of 3 positions shown · non-contrast
Comparison: None.

CLINICAL DATA: Pain after fall

EXAM:
LEFT ELBOW - COMPLETE 3+ VIEW

[x elbow lat left]
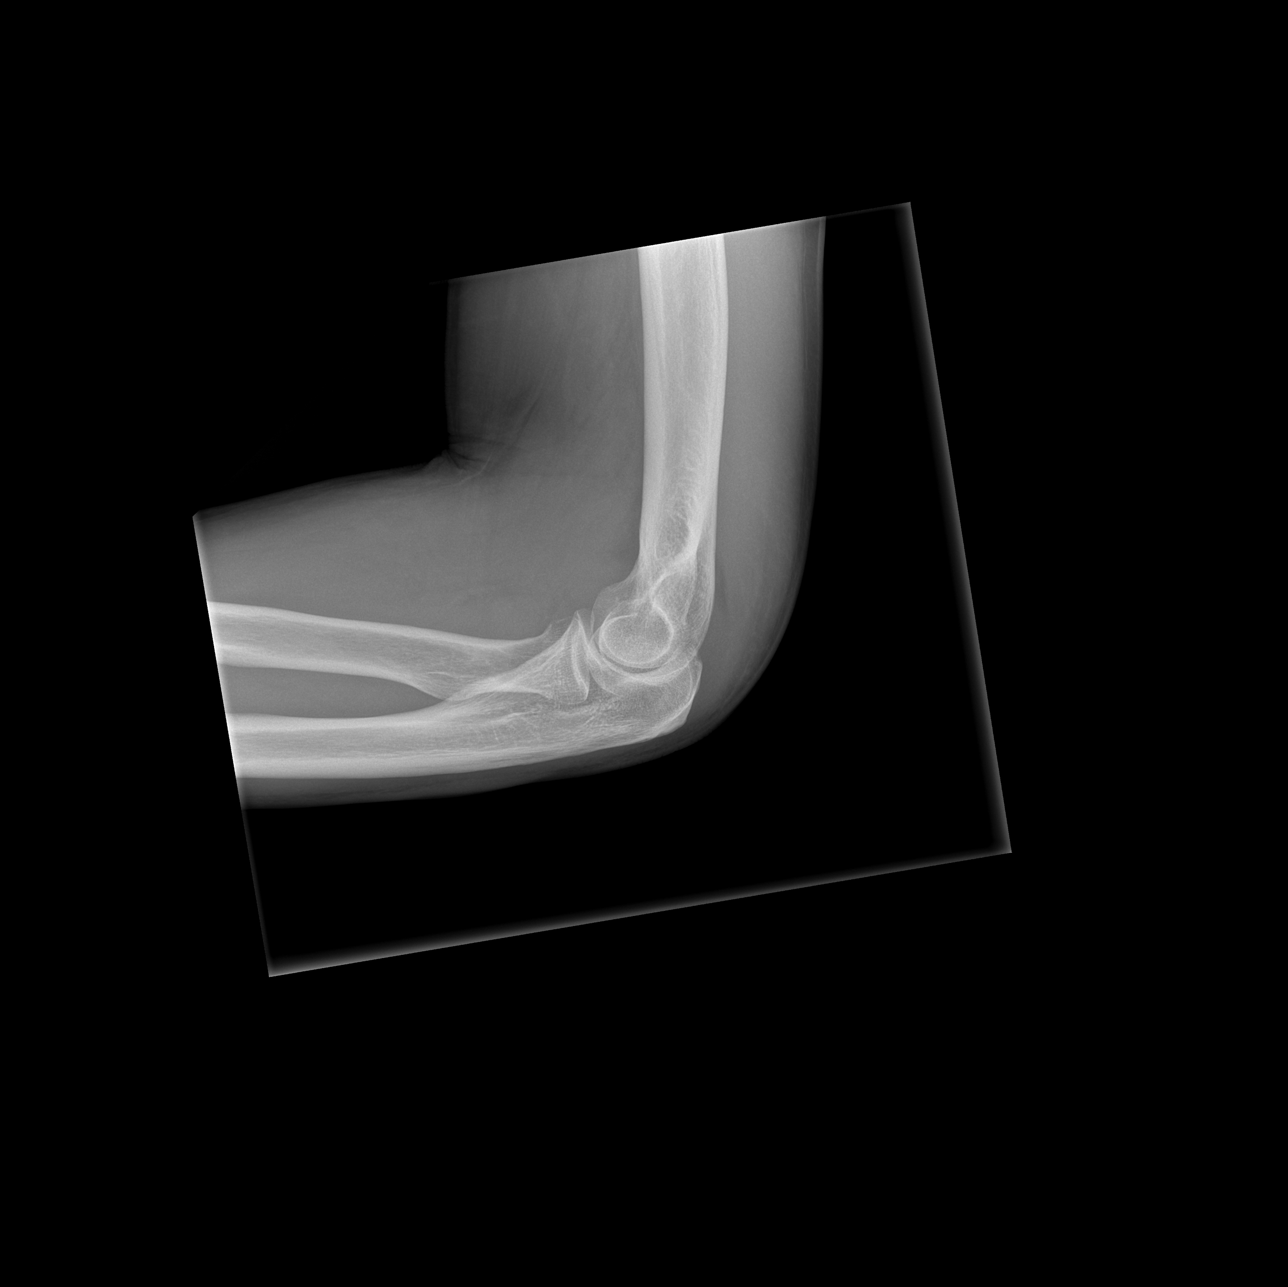

[x elbow obl left (1 of 2)]
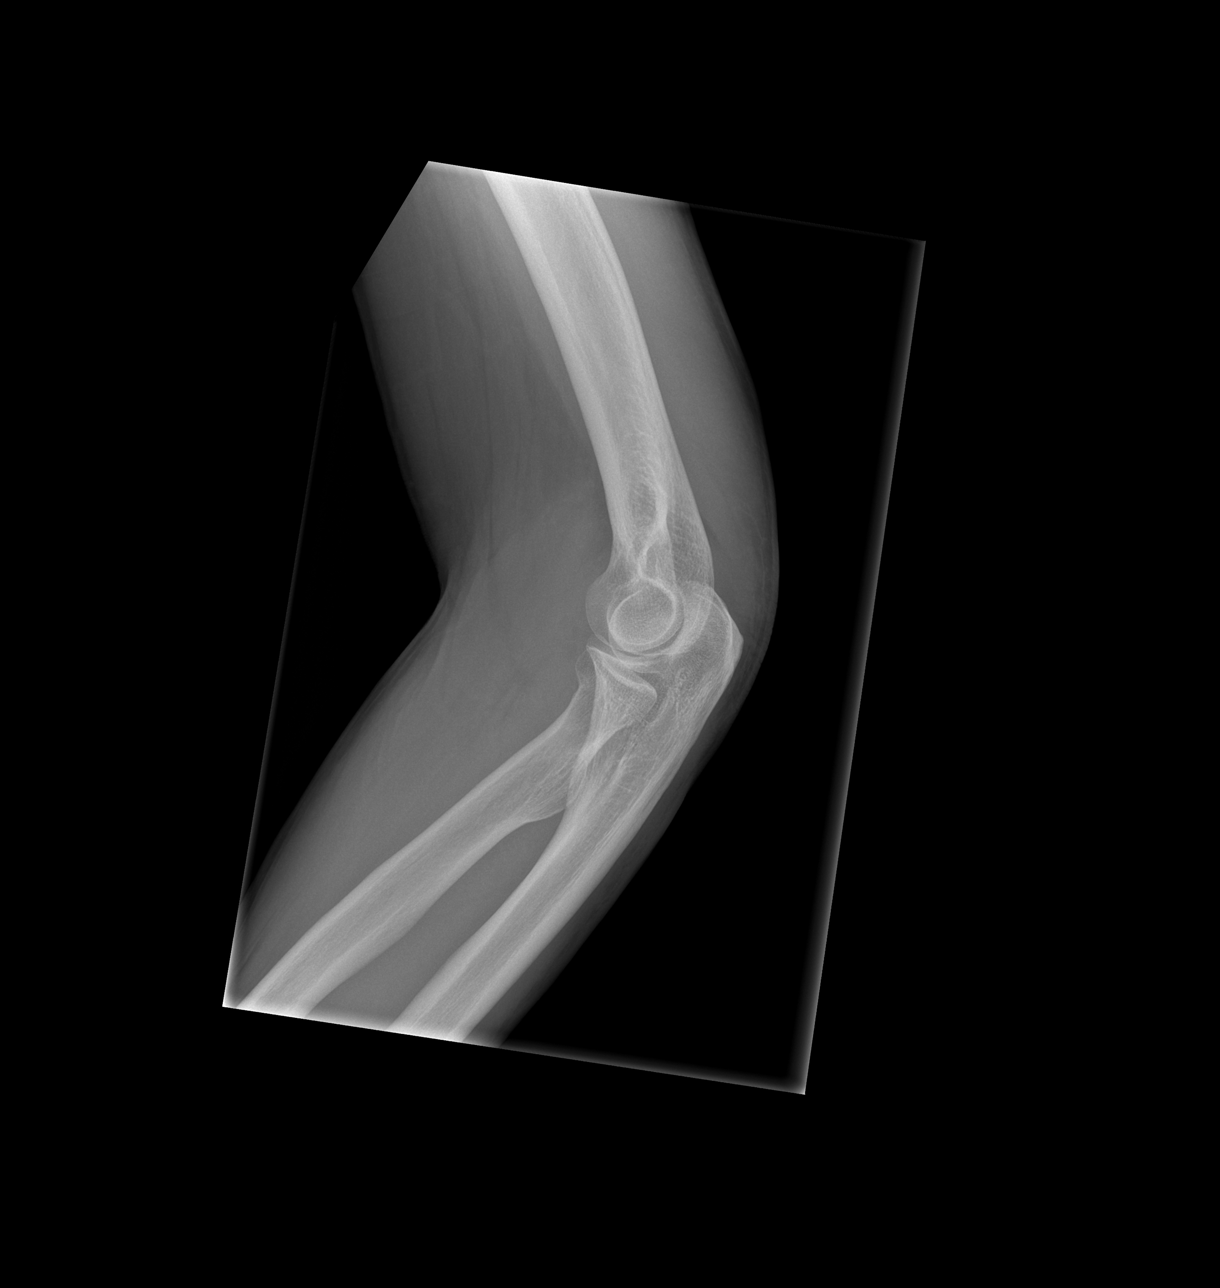

[x elbow obl left (2 of 2)]
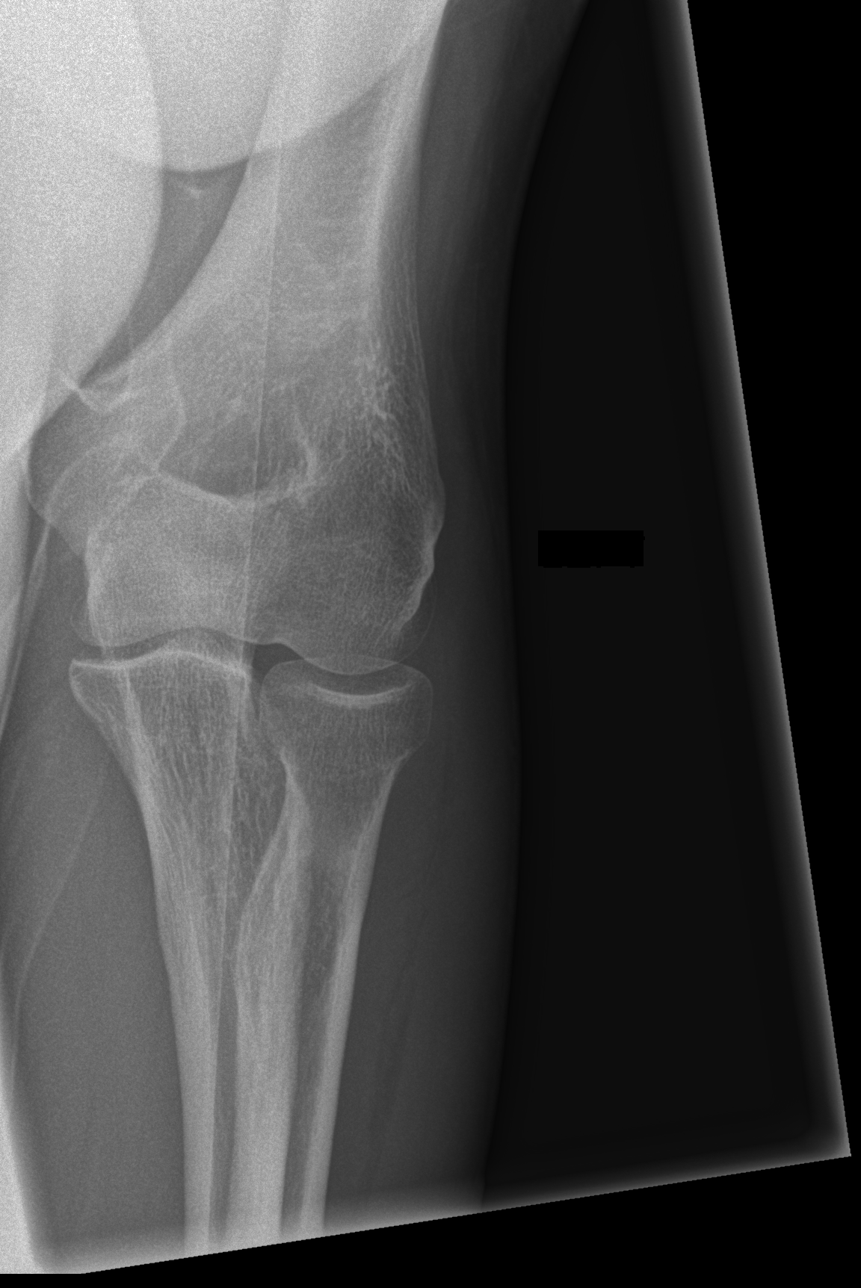

[3 of 3 positions shown; findings below may reference images not displayed]

FINDINGS: There is a joint effusion in the left elbow with displaced in the
fat pad and anterior fat pad. This suggests an underlying fracture.
While no fracture is seen with certainty, a subtle proximal radius
fracture would be most likely.
IMPRESSION: Left elbow joint effusion. This strongly suggests an underlying
occult fracture. While no definitive fractures are noted, a proximal
radius fracture is most likely and there may be a subtle lucency in
the radial neck.

## 2019-02-20 NOTE — Progress Notes (Signed)
Patient sent letter informing him of mildly elevated Microalbumin / Cr ratio and plan to repeat test to confirm.

## 2019-02-21 ENCOUNTER — Other Ambulatory Visit: Payer: Self-pay

## 2019-02-21 ENCOUNTER — Ambulatory Visit: Payer: Self-pay | Admitting: Internal Medicine

## 2019-02-21 ENCOUNTER — Other Ambulatory Visit: Payer: 59

## 2019-02-21 DIAGNOSIS — B20 Human immunodeficiency virus [HIV] disease: Secondary | ICD-10-CM

## 2019-02-22 ENCOUNTER — Encounter: Payer: Self-pay | Admitting: General Practice

## 2019-02-22 LAB — T-HELPER CELL (CD4) - (RCID CLINIC ONLY)
CD4 % Helper T Cell: 23 % — ABNORMAL LOW (ref 33–65)
CD4 T Cell Abs: 335 /uL — ABNORMAL LOW (ref 400–1790)

## 2019-02-22 NOTE — Progress Notes (Signed)
[facility] CSW Progress Notes  Call to patient to request permission to refer to [employer], left VM, awaiting return call.  Edwyna Shell, LCSW Clinical Social Worker Phone:  [phone]

## 2019-02-23 ENCOUNTER — Ambulatory Visit (INDEPENDENT_AMBULATORY_CARE_PROVIDER_SITE_OTHER): Payer: 59 | Admitting: Internal Medicine

## 2019-02-23 ENCOUNTER — Other Ambulatory Visit: Payer: Self-pay

## 2019-02-23 ENCOUNTER — Encounter: Payer: Self-pay | Admitting: Internal Medicine

## 2019-02-23 VITALS — BP 154/91 | HR 86 | Temp 98.8°F | Ht 72.0 in | Wt 227.4 lb

## 2019-02-23 DIAGNOSIS — L853 Xerosis cutis: Secondary | ICD-10-CM | POA: Diagnosis not present

## 2019-02-23 DIAGNOSIS — Z79899 Other long term (current) drug therapy: Secondary | ICD-10-CM

## 2019-02-23 DIAGNOSIS — I10 Essential (primary) hypertension: Secondary | ICD-10-CM

## 2019-02-23 MED ORDER — DILTIAZEM HCL ER 120 MG PO CP24: 120.0000 mg | ORAL_CAPSULE | Freq: Every day | ORAL | 2 refills | Status: DC

## 2019-02-23 NOTE — Progress Notes (Signed)
   CC: peeling, itching skin   HPI:  Jacob Joseph is a 63 y.o. gentleman with PMHx listed below who presents for 4 week history of peeling skin with associated pruritis. He first noticed in on his left thigh which resolved after several days of putting cocoa butter on it. He states his wife then noticed in on his low back and gluteal region. Patient first noticed these skin changes after being discharged from prolong hospitalization. Denies any fevers, chills. The peeling skin is not erythematous, warm or painful. He denies any changes in detergents, soaps, medications, diet, prolonged sun exposure, tick bites, or other chemical exposures.   Past Medical History:  Diagnosis Date  . AKI (acute kidney injury) ([place]) 09/24/2016  . Calcium oxalate crystals in urine 12/23/2011   Asymptomatic, no hematuria. Advised to take plenty of water.  . Calculus of gallbladder without cholecystitis without obstruction   . COPD (chronic obstructive pulmonary disease) ([place])   . Depression   . Diabetes mellitus without complication ([place]) 8338   pre diabetic  . Hepatitis C   . HIV (human immunodeficiency virus infection) ([place])   . Hypertension   . PE (pulmonary thromboembolism) ([place]) 05/12/2016   PE in 2017, on chronic anticoagulation for this and recurrent DVT.  [name] Kitchen Peripheral arterial disease ([place])    ooccluded left SFA by duplex ultrasound, tibial disease left greater than right  . Prostate cancer metastatic to multiple sites [facility])   . Stroke ([place]) 11/2011   Carotids Doppler negative. Right sided weakness resolved, initially on Plavix for 3 months and then continued with ASA.    . TB lung, latent 1988   Treated   Review of Systems:  Review of Systems  Constitutional: Negative for chills, fever and malaise/fatigue.  Respiratory: Negative for cough and shortness of breath.   Cardiovascular: Negative for chest pain and palpitations.  Gastrointestinal: Negative for diarrhea, nausea and vomiting.   Genitourinary: Negative for dysuria and hematuria.  Musculoskeletal: Negative for joint pain.  Neurological: Negative for dizziness, tingling, sensory change and headaches.    Physical Exam:  Vitals:   02/23/19 1351  BP: (!) 154/91  Pulse: 86  Temp: 98.8 F (37.1 C)  TempSrc: Oral  SpO2: 100%  Weight: 227 lb 6.4 oz (103.1 kg)  Height: 6' (1.829 m)   Physical Exam Constitutional:      General: He is not in acute distress.    Appearance: Normal appearance.  Eyes:     Conjunctiva/sclera: Conjunctivae normal.  Cardiovascular:     Rate and Rhythm: Normal rate and regular rhythm.  Pulmonary:     Effort: Pulmonary effort is normal.     Breath sounds: Normal breath sounds.  Skin:    Comments: Dry, peeling skin starting at low back and extending down into gluteal folds. No erythema, wound, ecchymosis or overlying rash noted. Skin is non-tender   Neurological:     General: No focal deficit present.     Mental Status: He is alert and oriented to person, place, and time.  Psychiatric:        Mood and Affect: Mood normal.        Behavior: Behavior normal.      Assessment & Plan:   See Encounters Tab for problem based charting.  Patient discussed with Dr. Rebeca Alert

## 2019-02-23 NOTE — Patient Instructions (Addendum)
Mr. Hemphill, It was a pleasure meeting you. Today we discussed the skin changes on your back which appear to be due to severely dry skin.   The best treatment for this is petroleum jelly or moisturizers containing glycerin (eucerin advanced repair, aveeno). Have your wife apply several times per day. I'd also recommend using a sensitive skin soap when you bathe such as Dove.   Your PCP wanted to increase your blood pressure medications since you have been persistently elevated. I have sent this new prescription into your pharmacy.   Please call if your symptoms do not improve.   Take care, Dr. Koleen Distance

## 2019-02-26 ENCOUNTER — Encounter: Payer: Self-pay | Admitting: Internal Medicine

## 2019-02-26 DIAGNOSIS — L853 Xerosis cutis: Secondary | ICD-10-CM | POA: Insufficient documentation

## 2019-02-26 NOTE — Assessment & Plan Note (Signed)
Patient's blood pressure remains above goal. Last PCP note reviewed and planned to escalate therapy based on microalbumin/cr ratio. Given that it is elevated, will initiate Diltiazem 120 mg daily and have him follow-up with PCP as scheduled.

## 2019-02-26 NOTE — Assessment & Plan Note (Signed)
Patient's presents with 4 weeks of progressive peeling and prurutic skin to low back and gluteal region. No new detergents, soaps, diet changes, prolonged sun exposure, insect bites, or chemical exposures.  Most likely from lying in the same position while hospitalized for several days.  Instructed to continue using cocoa butter, petroleum jelly, or moisturizers with glycerin and applying several times throughout the day particularly after bathing. Also advised using soaps for sensitive skin. Return if no improvement within 2-3 weeks.

## 2019-02-27 ENCOUNTER — Other Ambulatory Visit: Payer: Self-pay | Admitting: Oncology

## 2019-02-27 DIAGNOSIS — C61 Malignant neoplasm of prostate: Secondary | ICD-10-CM

## 2019-02-28 ENCOUNTER — Ambulatory Visit (INDEPENDENT_AMBULATORY_CARE_PROVIDER_SITE_OTHER): Payer: 59 | Admitting: Internal Medicine

## 2019-02-28 ENCOUNTER — Encounter: Payer: Self-pay | Admitting: Internal Medicine

## 2019-02-28 ENCOUNTER — Other Ambulatory Visit: Payer: Self-pay

## 2019-02-28 DIAGNOSIS — I10 Essential (primary) hypertension: Secondary | ICD-10-CM | POA: Diagnosis not present

## 2019-02-28 DIAGNOSIS — B20 Human immunodeficiency virus [HIV] disease: Secondary | ICD-10-CM

## 2019-02-28 DIAGNOSIS — F17210 Nicotine dependence, cigarettes, uncomplicated: Secondary | ICD-10-CM | POA: Diagnosis not present

## 2019-02-28 DIAGNOSIS — F1910 Other psychoactive substance abuse, uncomplicated: Secondary | ICD-10-CM

## 2019-02-28 LAB — HIV-1 RNA QUANT-NO REFLEX-BLD
HIV 1 RNA Quant: <20 copies/mL
HIV-1 RNA Quant, Log: <1.3 Log copies/mL

## 2019-02-28 MED FILL — ABIRATERONE ACETATE 250 MG: 250 | 30 days supply | Qty: 120 | Fill #0

## 2019-02-28 NOTE — Assessment & Plan Note (Signed)
I talked to him again about the importance of cigarette cessation.

## 2019-02-28 NOTE — Progress Notes (Addendum)
Patient Active Problem List   Diagnosis Date Noted   HIV disease ([place]) 04/27/2006    Priority: High   Xerosis of skin 02/26/2019   Pressure injury of skin 01/13/2019   Angioedema 01/10/2019   Leukocytosis 09/01/2018   Severe sepsis ([place]) 08/31/2018   Closed fracture of proximal end of left humerus with delayed healing 05/26/2018   Spine metastasis ([place]) 03/24/2018   Acute deep vein thrombosis (DVT) of right popliteal vein (HCC)    Prostate cancer metastatic to multiple sites [facility])    Radicular pain of thoracic region    Bone lesion    Chronic anticoagulation    Right upper quadrant pain 02/26/2018   Abnormal computed tomography of thoracic spine 02/26/2018   Recurrent falls 11/02/2016   History of prostate cancer 09/22/2016   BPH associated with nocturia 06/19/2016   Substance abuse ([place]) 01/27/2016   Vitamin D deficiency 12/05/2015   Hyperlipidemia 01/28/2015   Cataracts, bilateral 08/29/2014   Diabetes mellitus treated with oral medication ([place]) 10/19/2013   COPD, moderate ([place]) 05/31/2013   Complex Lt Renal Cyst 03/22/2013   Preventive measure 05/30/2012   History of CVA 03/08/2012   Normocytic anemia 03/08/2012   CKD (chronic kidney disease) stage 3, GFR 30-59 ml/min (HCC) 03/08/2012   ERECTILE DYSFUNCTION 04/25/2008   Smoking greater than 30 pack years 04/27/2006   Major depressive disorder, recurrent episode ([place]) 04/27/2006   Hypertension 04/27/2006   DEGENERATIVE JOINT DISEASE 04/27/2006   Chronic Low Back Pain 04/27/2006    Patient's Medications  New Prescriptions   No medications on file  Previous Medications   ABIRATERONE ACETATE (ZYTIGA) 250 MG TABLET    TAKE 4 TABLETS (1,000 MG TOTAL) BY MOUTH DAILY. TAKE ON AN EMPTY STOMACH 1 HOUR BEFORE OR 2 HOURS AFTER A MEAL   APIXABAN (ELIQUIS) 2.5 MG TABS TABLET    Take 1 tablet (2.5 mg total) by mouth 2 (two) times daily.   ASPIRIN 81 MG EC TABLET    Take 1 tablet (81  mg total) by mouth daily.   AZELASTINE (ASTELIN) 0.1 % NASAL SPRAY    USE 1 SPRAY IN EACH NOSTRIL TWICE DAILY AS NEEDED FOR RHINITIS   BICTEGRAVIR-EMTRICITABINE-TENOFOVIR AF (BIKTARVY) 50-200-25 MG TABS TABLET    Take 1 tablet by mouth daily.   BLOOD GLUCOSE MONITORING SUPPL (ONETOUCH VERIO) W/DEVICE KIT    1 Device by Does not apply route 2 (two) times daily. ICD 10  E11.8, non-insulin dependent, test up to twice daily   CHOLECALCIFEROL (VITAMIN D) 1000 UNITS TABLET    Take 1 tablet (1,000 Units total) by mouth daily.   CITALOPRAM (CELEXA) 20 MG TABLET    Take 20 mg by mouth daily.   DILTIAZEM (DILACOR XR) 120 MG 24 HR CAPSULE    Take 1 capsule (120 mg total) by mouth daily.   GABAPENTIN (NEURONTIN) 300 MG CAPSULE    TAKE 2 CAPSULES(600 MG) BY MOUTH TWICE DAILY   GLUCOSE BLOOD (ONETOUCH VERIO) TEST STRIP    Use as instructed ICD 10  E11.8, non-insulin dependent, test up to twice daily   METFORMIN (GLUCOPHAGE) 500 MG TABLET    Take 1 tablet (500 mg total) by mouth 2 (two) times daily with a meal.   ONETOUCH DELICA LANCETS FINE MISC    1 Stick by Does not apply route 2 (two) times daily. ICD 10  E11.8, non-insulin dependent, test up to twice daily   PRAVASTATIN (PRAVACHOL) 40 MG TABLET  TAKE 1 TABLET(40 MG) BY MOUTH DAILY   QUETIAPINE (SEROQUEL) 400 MG TABLET    Take 1 tablet (400 mg total) by mouth 2 (two) times daily.   TAMSULOSIN (FLOMAX) 0.4 MG CAPS CAPSULE    TAKE 1 CAPSULE(0.4 MG) BY MOUTH DAILY AFTER BREAKFAST   VITAMIN E 200 UNIT CAPSULE    Take 200 Units by mouth daily.  Modified Medications   No medications on file  Discontinued Medications   METOPROLOL TARTRATE (LOPRESSOR) 50 MG TABLET    TAKE 2 TABLETS BY MOUTH DAILY    Subjective: Jacob Joseph is in for his routine HIV follow-up visit.  He has not had any problems obtaining, taking or tolerating his Biktarvy.  He is using his pillbox and does not recall missing doses.  He continues to smoke cigarettes and has no current plan to quit.  He  also continues to use cocaine every few weeks.  Review of Systems: Review of Systems  Constitutional: Negative for chills, diaphoresis, fever, malaise/fatigue and weight loss.  HENT: Negative for sore throat.   Respiratory: Negative for cough, sputum production and shortness of breath.   Cardiovascular: Negative for chest pain.  Gastrointestinal: Negative for abdominal pain, diarrhea, heartburn, nausea and vomiting.  Genitourinary: Negative for dysuria and frequency.  Musculoskeletal: Negative for joint pain and myalgias.  Skin: Negative for rash.  Neurological: Positive for focal weakness. Negative for dizziness and headaches.  Psychiatric/Behavioral: Positive for substance abuse. Negative for depression. The patient is not nervous/anxious.     Past Medical History:  Diagnosis Date   AKI (acute kidney injury) ([place]) 09/24/2016   Calcium oxalate crystals in urine 12/23/2011   Asymptomatic, no hematuria. Advised to take plenty of water.   Calculus of gallbladder without cholecystitis without obstruction    COPD (chronic obstructive pulmonary disease) (HCC)    Depression    Diabetes mellitus without complication ([place]) 7412   pre diabetic   Hepatitis C    HIV (human immunodeficiency virus infection) ([place])    Hypertension    PE (pulmonary thromboembolism) ([place]) 05/12/2016   PE in 2017, on chronic anticoagulation for this and recurrent DVT.   Peripheral arterial disease ([place])    ooccluded left SFA by duplex ultrasound, tibial disease left greater than right   Prostate cancer metastatic to multiple sites [facility])    Stroke ([place]) 11/2011   Carotids Doppler negative. Right sided weakness resolved, initially on Plavix for 3 months and then continued with ASA.     TB lung, latent 1988   Treated    Social History   Tobacco Use   Smoking status: Current Every Day Smoker    Packs/day: 0.45    Types: Cigarettes    Last attempt to quit: 11/18/2014    Years since quitting: 4.2    Smokeless tobacco: Never Used   Tobacco comment: .5 PPD  Substance Use Topics   Alcohol use: No    Alcohol/week: 0.0 standard drinks   Drug use: Yes    Frequency: 3.0 times per week    Types: "Crack" cocaine, Cocaine    Comment: last times saturday    Family History  Problem Relation Age of Onset   Hypertension Mother    Heart attack Mother    Stroke Mother    Hypertension Father    Cancer Father     Allergies  Allergen Reactions   Lisinopril Swelling   Statins Other (See Comments)    Elevated liver enzymes.    Health Maintenance  Topic Date Due  INFLUENZA VACCINE  02/18/2019   FOOT EXAM  03/04/2019   HEMOGLOBIN A1C  04/12/2019   OPHTHALMOLOGY EXAM  05/25/2019   LIPID PANEL  08/25/2019   URINE MICROALBUMIN  02/09/2020   TETANUS/TDAP  05/30/2022   COLONOSCOPY  05/17/2023   PNEUMOCOCCAL POLYSACCHARIDE VACCINE AGE 25-64 HIGH RISK  Completed   Hepatitis C Screening  Completed   HIV Screening  Completed    Objective:  Vitals:   02/28/19 1416 02/28/19 1417  BP: (!) 168/103 (!) 161/103  Temp: 98.7 F (37.1 C)    There is no height or weight on file to calculate BMI.  Physical Exam Constitutional:      Comments: He is in good spirits.  HENT:     Mouth/Throat:     Pharynx: No oropharyngeal exudate.  Eyes:     Conjunctiva/sclera: Conjunctivae normal.  Cardiovascular:     Rate and Rhythm: Normal rate and regular rhythm.     Heart sounds: No murmur.  Pulmonary:     Effort: Pulmonary effort is normal.     Breath sounds: Normal breath sounds.  Abdominal:     Palpations: Abdomen is soft. There is no mass.     Tenderness: There is no abdominal tenderness.  Musculoskeletal: Normal range of motion.  Skin:    Findings: No rash.  Neurological:     Mental Status: He is alert and oriented to person, place, and time.  Psychiatric:        Mood and Affect: Mood normal.     Lab Results Lab Results  Component Value Date   WBC 7.1 02/02/2019     HGB 9.8 (L) 02/02/2019   HCT 31.0 (L) 02/02/2019   MCV 85.6 02/02/2019   PLT 282 02/02/2019    Lab Results  Component Value Date   CREATININE 1.21 02/02/2019   BUN 13 02/02/2019   NA 141 02/02/2019   K 3.9 02/02/2019   CL 110 02/02/2019   CO2 21 (L) 02/02/2019    Lab Results  Component Value Date   ALT 11 02/02/2019   AST 12 (L) 02/02/2019   ALKPHOS 103 02/02/2019   BILITOT <0.2 (L) 02/02/2019    Lab Results  Component Value Date   CHOL 121 08/24/2018   HDL 47 08/24/2018   LDLCALC 52 08/24/2018   TRIG 177 (H) 01/13/2019   CHOLHDL 2.6 08/24/2018   Lab Results  Component Value Date   LABRPR NON-REACTIVE 08/24/2018   HIV 1 RNA Quant (copies/mL)  Date Value  02/21/2019 <20 NOT DETECTED  08/24/2018 <20 NOT DETECTED  11/04/2017 <20 NOT DETECTED   CD4 T Cell Abs (/uL)  Date Value  02/21/2019 335 (L)  01/11/2019 122 (L)  01/10/2019 104 (L)     Problem List Items Addressed This Visit      High   HIV disease ([place]) (Chronic)    His infection remains under excellent, long-term control.  He will continue Biktarvy and follow-up after lab work in 6 months.      Relevant Orders   T-helper cell (CD4)- ([facility] clinic only)   HIV-1 RNA quant-no reflex-bld   CBC   Comprehensive metabolic panel   Lipid panel   RPR     Unprioritized   Substance abuse ([place])    I talked to him again about the danger of cocaine use, especially in light of his previous stroke and COPD.      Smoking greater than 30 pack years    I talked to him again about the importance  of cigarette cessation.      Hypertension    His blood pressure remains poorly controlled.  He is at high risk for recurrent CVA.  I asked him to follow-up with his PCP as soon as possible.           Michel Bickers, MD [facility] for [facility] [place] [facility] [phone] pager   [phone] cell 02/28/2019, 2:56 PM

## 2019-02-28 NOTE — Progress Notes (Signed)
Internal Medicine Clinic Attending  Case discussed with Dr. Bloomfield at the time of the visit.  We reviewed the resident's history and exam and pertinent patient test results.  I agree with the assessment, diagnosis, and plan of care documented in the resident's note.  Alexander Raines, M.D., Ph.D.  

## 2019-02-28 NOTE — Assessment & Plan Note (Signed)
His depression is in remission. 

## 2019-02-28 NOTE — Assessment & Plan Note (Signed)
His blood pressure remains poorly controlled.  He is at high risk for recurrent CVA.  I asked him to follow-up with his PCP as soon as possible.

## 2019-02-28 NOTE — Assessment & Plan Note (Signed)
His infection remains under excellent, long-term control.  He will continue Biktarvy and follow-up after lab work in 6 months. 

## 2019-02-28 NOTE — Assessment & Plan Note (Signed)
I talked to him again about the danger of cocaine use, especially in light of his previous stroke and COPD.

## 2019-03-09 ENCOUNTER — Other Ambulatory Visit: Payer: Self-pay | Admitting: Internal Medicine

## 2019-03-09 DIAGNOSIS — M1990 Unspecified osteoarthritis, unspecified site: Secondary | ICD-10-CM

## 2019-03-09 DIAGNOSIS — M5416 Radiculopathy, lumbar region: Secondary | ICD-10-CM

## 2019-03-10 NOTE — Telephone Encounter (Signed)
Refill approved.

## 2019-03-14 ENCOUNTER — Other Ambulatory Visit: Payer: Self-pay | Admitting: Internal Medicine

## 2019-03-14 DIAGNOSIS — E118 Type 2 diabetes mellitus with unspecified complications: Secondary | ICD-10-CM

## 2019-03-16 ENCOUNTER — Telehealth: Payer: Self-pay | Admitting: Internal Medicine

## 2019-03-16 NOTE — Telephone Encounter (Signed)
rtc lm for rtc 

## 2019-03-16 NOTE — Telephone Encounter (Signed)
Pt is requesting a call from nurse [phone]

## 2019-04-04 ENCOUNTER — Other Ambulatory Visit: Payer: Self-pay | Admitting: Oncology

## 2019-04-04 DIAGNOSIS — C61 Malignant neoplasm of prostate: Secondary | ICD-10-CM

## 2019-04-07 ENCOUNTER — Other Ambulatory Visit: Payer: Self-pay

## 2019-04-07 ENCOUNTER — Encounter: Payer: Self-pay | Admitting: Podiatry

## 2019-04-07 ENCOUNTER — Ambulatory Visit (INDEPENDENT_AMBULATORY_CARE_PROVIDER_SITE_OTHER): Payer: 59 | Admitting: Podiatry

## 2019-04-07 DIAGNOSIS — M79676 Pain in unspecified toe(s): Secondary | ICD-10-CM | POA: Diagnosis not present

## 2019-04-07 DIAGNOSIS — E1151 Type 2 diabetes mellitus with diabetic peripheral angiopathy without gangrene: Secondary | ICD-10-CM | POA: Diagnosis not present

## 2019-04-07 DIAGNOSIS — B351 Tinea unguium: Secondary | ICD-10-CM

## 2019-04-07 DIAGNOSIS — B353 Tinea pedis: Secondary | ICD-10-CM | POA: Diagnosis not present

## 2019-04-07 DIAGNOSIS — E119 Type 2 diabetes mellitus without complications: Secondary | ICD-10-CM

## 2019-04-07 MED ORDER — CICLOPIROX OLAMINE 0.77 % EX CREA: TOPICAL_CREAM | Freq: Two times a day (BID) | CUTANEOUS | 1 refills | Status: DC

## 2019-04-07 NOTE — Patient Instructions (Signed)
Diabetes Mellitus and Foot Care Foot care is an important part of your health, especially when you have diabetes. Diabetes may cause you to have problems because of poor blood flow (circulation) to your feet and legs, which can cause your skin to:  Become thinner and drier.  Break more easily.  Heal more slowly.  Peel and crack. You may also have nerve damage (neuropathy) in your legs and feet, causing decreased feeling in them. This means that you may not notice minor injuries to your feet that could lead to more serious problems. Noticing and addressing any potential problems early is the best way to prevent future foot problems. How to care for your feet Foot hygiene  Wash your feet daily with warm water and mild soap. Do not use hot water. Then, pat your feet and the areas between your toes until they are completely dry. Do not soak your feet as this can dry your skin.  Trim your toenails straight across. Do not dig under them or around the cuticle. File the edges of your nails with an emery board or nail file.  Apply a moisturizing lotion or petroleum jelly to the skin on your feet and to dry, brittle toenails. Use lotion that does not contain alcohol and is unscented. Do not apply lotion between your toes. Shoes and socks  Wear clean socks or stockings every day. Make sure they are not too tight. Do not wear knee-high stockings since they may decrease blood flow to your legs.  Wear shoes that fit properly and have enough cushioning. Always look in your shoes before you put them on to be sure there are no objects inside.  To break in new shoes, wear them for just a few hours a day. This prevents injuries on your feet. Wounds, scrapes, corns, and calluses  Check your feet daily for blisters, cuts, bruises, sores, and redness. If you cannot see the bottom of your feet, use a mirror or ask someone for help.  Do not cut corns or calluses or try to remove them with medicine.  If you  find a minor scrape, cut, or break in the skin on your feet, keep it and the skin around it clean and dry. You may clean these areas with mild soap and water. Do not clean the area with peroxide, alcohol, or iodine.  If you have a wound, scrape, corn, or callus on your foot, look at it several times a day to make sure it is healing and not infected. Check for: ? Redness, swelling, or pain. ? Fluid or blood. ? Warmth. ? Pus or a bad smell. General instructions  Do not cross your legs. This may decrease blood flow to your feet.  Do not use heating pads or hot water bottles on your feet. They may burn your skin. If you have lost feeling in your feet or legs, you may not know this is happening until it is too late.  Protect your feet from hot and cold by wearing shoes, such as at the beach or on hot pavement.  Schedule a complete foot exam at least once a year (annually) or more often if you have foot problems. If you have foot problems, report any cuts, sores, or bruises to your health care provider immediately. Contact a health care provider if:  You have a medical condition that increases your risk of infection and you have any cuts, sores, or bruises on your feet.  You have an injury that is not   healing.  You have redness on your legs or feet.  You feel burning or tingling in your legs or feet.  You have pain or cramps in your legs and feet.  Your legs or feet are numb.  Your feet always feel cold.  You have pain around a toenail. Get help right away if:  You have a wound, scrape, corn, or callus on your foot and: ? You have pain, swelling, or redness that gets worse. ? You have fluid or blood coming from the wound, scrape, corn, or callus. ? Your wound, scrape, corn, or callus feels warm to the touch. ? You have pus or a bad smell coming from the wound, scrape, corn, or callus. ? You have a fever. ? You have a red line going up your leg. Summary  Check your feet every day  for cuts, sores, red spots, swelling, and blisters.  Moisturize feet and legs daily.  Wear shoes that fit properly and have enough cushioning.  If you have foot problems, report any cuts, sores, or bruises to your health care provider immediately.  Schedule a complete foot exam at least once a year (annually) or more often if you have foot problems. This information is not intended to replace advice given to you by your health care provider. Make sure you discuss any questions you have with your health care provider. Document Released: 07/03/2000 Document Revised: 08/18/2017 Document Reviewed: 08/07/2016 Elsevier Patient Education  2020 Elsevier Inc.  Athlete's Foot  Athlete's foot (tinea pedis) is a fungal infection of the skin on your feet. It often occurs on the skin that is between or underneath the toes. It can also occur on the soles of your feet. The infection can spread from person to person (is contagious). It can also spread when a person's bare feet come in contact with the fungus on shower floors or on items such as shoes. What are the causes? This condition is caused by a fungus that grows in warm, moist places. You can get athlete's foot by sharing shoes, shower stalls, towels, and wet floors with someone who is infected. Not washing your feet or changing your socks often enough can also lead to athlete's foot. What increases the risk? This condition is more likely to develop in:  Men.  People who have a weak body defense system (immune system).  People who have diabetes.  People who use public showers, such as at a gym.  People who wear heavy-duty shoes, such as industrial or military shoes.  Seasons with warm, humid weather. What are the signs or symptoms? Symptoms of this condition include:  Itchy areas between your toes or on the soles of your feet.  White, flaky, or scaly areas between your toes or on the soles of your feet.  Very itchy small blisters  between your toes or on the soles of your feet.  Small cuts in your skin. These cuts can become infected.  Thick or discolored toenails. How is this diagnosed? This condition may be diagnosed with a physical exam and a review of your medical history. Your health care provider may also take a skin or toenail sample to examine under a microscope. How is this treated? This condition is treated with antifungal medicines. These may be applied as powders, ointments, or creams. In severe cases, an oral antifungal medicine may be given. Follow these instructions at home: Medicines  Apply or take over-the-counter and prescription medicines only as told by your health care provider.    Apply your antifungal medicine as told by your health care provider. Do not stop using the antifungal even if your condition improves. Foot care  Do not scratch your feet.  Keep your feet dry: ? Wear cotton or wool socks. Change your socks every day or if they become wet. ? Wear shoes that allow air to flow, such as sandals or canvas tennis shoes.  Wash and dry your feet, including the area between your toes. Also, wash and dry your feet: ? Every day or as told by your health care provider. ? After exercising. General instructions  Do not let others use towels, shoes, nail clippers, or other personal items that touch your feet.  Protect your feet by wearing sandals in wet areas, such as locker rooms and shared showers.  Keep all follow-up visits as told by your health care provider. This is important.  If you have diabetes, keep your blood sugar under control. Contact a health care provider if:  You have a fever.  You have swelling, soreness, warmth, or redness in your foot.  Your feet are not getting better with treatment.  Your symptoms get worse.  You have new symptoms. Summary  Athlete's foot (tinea pedis) is a fungal infection of the skin on your feet. It often occurs on skin that is between or  underneath the toes.  This condition is caused by a fungus that grows in warm, moist places.  Symptoms include white, flaky, or scaly areas between your toes or on the soles of your feet.  This condition is treated with antifungal medicines.  Keep your feet clean. Always dry them thoroughly. This information is not intended to replace advice given to you by your health care provider. Make sure you discuss any questions you have with your health care provider. Document Released: 07/03/2000 Document Revised: 07/01/2017 Document Reviewed: 04/26/2017 Elsevier Patient Education  2020 Elsevier Inc.  Onychomycosis/Fungal Toenails  WHAT IS IT? An infection that lies within the keratin of your nail plate that is caused by a fungus.  WHY ME? Fungal infections affect all ages, sexes, races, and creeds.  There may be many factors that predispose you to a fungal infection such as age, coexisting medical conditions such as diabetes, or an autoimmune disease; stress, medications, fatigue, genetics, etc.  Bottom line: fungus thrives in a warm, moist environment and your shoes offer such a location.  IS IT CONTAGIOUS? Theoretically, yes.  You do not want to share shoes, nail clippers or files with someone who has fungal toenails.  Walking around barefoot in the same room or sleeping in the same bed is unlikely to transfer the organism.  It is important to realize, however, that fungus can spread easily from one nail to the next on the same foot.  HOW DO WE TREAT THIS?  There are several ways to treat this condition.  Treatment may depend on many factors such as age, medications, pregnancy, liver and kidney conditions, etc.  It is best to ask your doctor which options are available to you.  1. No treatment.   Unlike many other medical concerns, you can live with this condition.  However for many people this can be a painful condition and may lead to ingrown toenails or a bacterial infection.  It is recommended  that you keep the nails cut short to help reduce the amount of fungal nail. 2. Topical treatment.  These range from herbal remedies to prescription strength nail lacquers.  About 40-50% effective, topicals require twice   daily application for approximately 9 to 12 months or until an entirely new nail has grown out.  The most effective topicals are medical grade medications available through physicians offices. 3. Oral antifungal medications.  With an 80-90% cure rate, the most common oral medication requires 3 to 4 months of therapy and stays in your system for a year as the new nail grows out.  Oral antifungal medications do require blood work to make sure it is a safe drug for you.  A liver function panel will be performed prior to starting the medication and after the first month of treatment.  It is important to have the blood work performed to avoid any harmful side effects.  In general, this medication safe but blood work is required. 4. Laser Therapy.  This treatment is performed by applying a specialized laser to the affected nail plate.  This therapy is noninvasive, fast, and non-painful.  It is not covered by insurance and is therefore, out of pocket.  The results have been very good with a 80-95% cure rate.  The [facility] is the only practice in the area to offer this therapy. 5. Permanent Nail Avulsion.  Removing the entire nail so that a new nail will not grow back. 

## 2019-04-10 ENCOUNTER — Encounter (HOSPITAL_COMMUNITY)
Admission: RE | Admit: 2019-04-10 | Discharge: 2019-04-10 | Disposition: A | Discharge status: Home / Self Care | Payer: 59 | Source: Ambulatory Visit | Attending: Urology | Admitting: Urology

## 2019-04-10 ENCOUNTER — Other Ambulatory Visit: Payer: Self-pay

## 2019-04-10 ENCOUNTER — Other Ambulatory Visit: Payer: Self-pay | Admitting: Oncology

## 2019-04-10 DIAGNOSIS — C61 Malignant neoplasm of prostate: Secondary | ICD-10-CM | POA: Insufficient documentation

## 2019-04-10 MED ORDER — TECHNETIUM TC 99M MEDRONATE IV KIT
22.0000 | PACK | Freq: Once | INTRAVENOUS | Status: AC
  Administered 2019-04-10: 12:00:00 22 via INTRAVENOUS

## 2019-04-12 ENCOUNTER — Ambulatory Visit (INDEPENDENT_AMBULATORY_CARE_PROVIDER_SITE_OTHER): Payer: 59 | Admitting: *Deleted

## 2019-04-12 ENCOUNTER — Other Ambulatory Visit: Payer: Self-pay

## 2019-04-12 DIAGNOSIS — Z23 Encounter for immunization: Secondary | ICD-10-CM | POA: Diagnosis not present

## 2019-04-12 MED FILL — ABIRATERONE ACETATE 250 MG: 250 | 30 days supply | Qty: 120 | Fill #0

## 2019-04-13 NOTE — Progress Notes (Signed)
Subjective: Jacob Joseph is seen today for preventative diabetic foot care follow up of painful, elongated, thickened toenails 1-5 b/l feet that he cannot cut. Pain interferes with daily activities. Aggravating factor includes wearing enclosed shoe gear and relieved with periodic debridement.  Patient also has tinea pedis and states his feet look better with prescribed cream. He is requesting refill on today.  He also remains on blood thinner, Eliquis.  Current Outpatient Medications on File Prior to Visit  Medication Sig  . abiraterone acetate (ZYTIGA) 250 MG tablet TAKE 4 TABLETS (1,000 MG TOTAL) BY MOUTH DAILY. TAKE ON AN EMPTY STOMACH 1 HOUR BEFORE OR 2 HOURS AFTER A MEAL  . apixaban (ELIQUIS) 2.5 MG TABS tablet Take 1 tablet (2.5 mg total) by mouth 2 (two) times daily.  [name] Kitchen aspirin 81 MG EC tablet Take 1 tablet (81 mg total) by mouth daily.  [name] Kitchen azelastine (ASTELIN) 0.1 % nasal spray USE 1 SPRAY IN EACH NOSTRIL TWICE DAILY AS NEEDED FOR RHINITIS  . bictegravir-emtricitabine-tenofovir AF (BIKTARVY) 50-200-25 MG TABS tablet Take 1 tablet by mouth daily.  . Blood Glucose Monitoring Suppl (ONETOUCH VERIO) w/Device KIT 1 Device by Does not apply route 2 (two) times daily. ICD 10  E11.8, non-insulin dependent, test up to twice daily  . CHANTIX 0.5 MG tablet TK 1 T PO QD FOR 3 DAYS THEN TK 1 T PO BID  . cholecalciferol (VITAMIN D) 1000 units tablet Take 1 tablet (1,000 Units total) by mouth daily. (Patient taking differently: Take 2,000 Units by mouth daily. )  . citalopram (CELEXA) 20 MG tablet Take 20 mg by mouth daily.  . diclofenac sodium (VOLTAREN) 1 % GEL APPLY 4 GRAMS EXTERNALLY TO THE AFFECTED AREA FOUR TIMES DAILY AS NEEDED FOR PAIN  . DILT-XR 120 MG 24 hr capsule TAKE 1 CAPSULE(120 MG) BY MOUTH DAILY  . FLOVENT HFA 220 MCG/ACT inhaler USE 1 PUFF PO BID  . gabapentin (NEURONTIN) 300 MG capsule Take 2 capsules (600 mg total) by mouth 2 (two) times daily.  [name] Kitchen glucose blood (ONETOUCH VERIO)  test strip Use as instructed ICD 10  E11.8, non-insulin dependent, test up to twice daily  . hydrOXYzine (VISTARIL) 50 MG capsule TK 1 C PO QHS  . metFORMIN (GLUCOPHAGE) 500 MG tablet TAKE 1 TABLET(500 MG) BY MOUTH TWICE DAILY WITH A MEAL  . ONETOUCH DELICA LANCETS FINE MISC 1 Stick by Does not apply route 2 (two) times daily. ICD 10  E11.8, non-insulin dependent, test up to twice daily  . pravastatin (PRAVACHOL) 40 MG tablet TAKE 1 TABLET(40 MG) BY MOUTH DAILY (Patient taking differently: Take 40 mg by mouth daily. )  . QUEtiapine (SEROQUEL) 400 MG tablet Take 1 tablet (400 mg total) by mouth 2 (two) times daily.  [name] Kitchen STIOLTO RESPIMAT 2.5-2.5 MCG/ACT AERS INL 2 PUFFS PO ITL D  . tamsulosin (FLOMAX) 0.4 MG CAPS capsule TAKE 1 CAPSULE(0.4 MG) BY MOUTH DAILY AFTER BREAKFAST (Patient taking differently: Take 0.4 mg by mouth daily after breakfast. )  . valACYclovir (VALTREX) 500 MG tablet TK 1 T PO D  . vitamin E 200 UNIT capsule Take 200 Units by mouth daily.   No current facility-administered medications on file prior to visit.      Allergies  Allergen Reactions  . Lisinopril Swelling  . Statins Other (See Comments)    Elevated liver enzymes.     Objective:  Vascular Examination: Capillary refill time <4 seconds x 10 digits.  Dorsalis pedis nonpalpable b/l.  Posterior tibial pulses present  b/l.  Digital hair  present x 10 digits.  Skin temperature gradient WNL b/l.   Dermatological Examination: Skin with normal turgor, texture and tone b/l.  Toenails 1-5 b/l discolored, thick, dystrophic with subungual debris and pain with palpation to nailbeds due to thickness of nails.  Improvement in pedal scaling, but not completely resolved. No blisters, no weeping, no interdigital maceration.  Musculoskeletal: Muscle strength 5/5 b/l to all LE muscle groups.  HAV with bunion b/l.  No pain, crepitus or joint limitation noted with ROM.   Neurological Examination: Protective sensation  diminished with 10 gram monofilament bilaterally.  Assessment: Painful onychomycosis toenails 1-5 b/l  Tinea pedis b/l NIDDM with PAD Pt on long term blood thinner HIV  Plan: 1. Toenails 1-5 b/l were debrided in length and girth without iatrogenic bleeding. 2. Refill Loprox Cream for tinea pedis. Prescription written for ciclopirox cream 0.77%.  Patient is to apply to both feet and between toes twice daily for 4 weeks. 3. Patient to continue soft, supportive shoe gear daily. 4. Patient to report any pedal injuries to medical professional immediately. 5. Follow up 3 months.  6. Patient/POA to call should there be a concern in the interim.

## 2019-04-17 ENCOUNTER — Other Ambulatory Visit: Payer: Self-pay | Admitting: Internal Medicine

## 2019-04-17 DIAGNOSIS — E118 Type 2 diabetes mellitus with unspecified complications: Secondary | ICD-10-CM

## 2019-04-19 NOTE — Telephone Encounter (Signed)
Refill approved.

## 2019-04-28 MED FILL — BICALUTAMIDE 50 MG TABS: 50 | 90 days supply | Qty: 90 | Fill #0

## 2019-05-10 ENCOUNTER — Other Ambulatory Visit: Payer: Self-pay | Admitting: Internal Medicine

## 2019-05-10 DIAGNOSIS — E118 Type 2 diabetes mellitus with unspecified complications: Secondary | ICD-10-CM

## 2019-05-10 DIAGNOSIS — M1990 Unspecified osteoarthritis, unspecified site: Secondary | ICD-10-CM

## 2019-05-10 NOTE — Telephone Encounter (Signed)
Refill approved.

## 2019-05-11 ENCOUNTER — Encounter: Payer: 59 | Admitting: Internal Medicine

## 2019-05-15 ENCOUNTER — Ambulatory Visit (INDEPENDENT_AMBULATORY_CARE_PROVIDER_SITE_OTHER): Payer: 59 | Admitting: Internal Medicine

## 2019-05-15 ENCOUNTER — Other Ambulatory Visit: Payer: Self-pay

## 2019-05-15 ENCOUNTER — Encounter: Payer: Self-pay | Admitting: Internal Medicine

## 2019-05-15 ENCOUNTER — Telehealth: Payer: Self-pay | Admitting: *Deleted

## 2019-05-15 VITALS — BP 148/92 | HR 102 | Temp 99.5°F | Ht 72.0 in | Wt 231.4 lb

## 2019-05-15 DIAGNOSIS — Z79899 Other long term (current) drug therapy: Secondary | ICD-10-CM

## 2019-05-15 DIAGNOSIS — I10 Essential (primary) hypertension: Secondary | ICD-10-CM

## 2019-05-15 DIAGNOSIS — Z79891 Long term (current) use of opiate analgesic: Secondary | ICD-10-CM

## 2019-05-15 DIAGNOSIS — I82431 Acute embolism and thrombosis of right popliteal vein: Secondary | ICD-10-CM

## 2019-05-15 DIAGNOSIS — E119 Type 2 diabetes mellitus without complications: Secondary | ICD-10-CM

## 2019-05-15 DIAGNOSIS — E1122 Type 2 diabetes mellitus with diabetic chronic kidney disease: Secondary | ICD-10-CM

## 2019-05-15 DIAGNOSIS — G8929 Other chronic pain: Secondary | ICD-10-CM

## 2019-05-15 DIAGNOSIS — M5137 Other intervertebral disc degeneration, lumbosacral region: Secondary | ICD-10-CM

## 2019-05-15 DIAGNOSIS — I129 Hypertensive chronic kidney disease with stage 1 through stage 4 chronic kidney disease, or unspecified chronic kidney disease: Secondary | ICD-10-CM

## 2019-05-15 DIAGNOSIS — E118 Type 2 diabetes mellitus with unspecified complications: Secondary | ICD-10-CM

## 2019-05-15 DIAGNOSIS — Z9181 History of falling: Secondary | ICD-10-CM

## 2019-05-15 DIAGNOSIS — Z7901 Long term (current) use of anticoagulants: Secondary | ICD-10-CM

## 2019-05-15 DIAGNOSIS — Z7984 Long term (current) use of oral hypoglycemic drugs: Secondary | ICD-10-CM | POA: Diagnosis not present

## 2019-05-15 DIAGNOSIS — N189 Chronic kidney disease, unspecified: Secondary | ICD-10-CM

## 2019-05-15 DIAGNOSIS — M545 Low back pain: Secondary | ICD-10-CM

## 2019-05-15 DIAGNOSIS — C7951 Secondary malignant neoplasm of bone: Secondary | ICD-10-CM

## 2019-05-15 DIAGNOSIS — C61 Malignant neoplasm of prostate: Secondary | ICD-10-CM

## 2019-05-15 DIAGNOSIS — Z86718 Personal history of other venous thrombosis and embolism: Secondary | ICD-10-CM

## 2019-05-15 DIAGNOSIS — R296 Repeated falls: Secondary | ICD-10-CM

## 2019-05-15 LAB — GLUCOSE, CAPILLARY: Glucose-Capillary: 171 mg/dL — ABNORMAL HIGH (ref 70–99)

## 2019-05-15 LAB — POCT GLYCOSYLATED HEMOGLOBIN (HGB A1C): Hemoglobin A1C: 7.9 % — AB (ref 4.0–5.6)

## 2019-05-15 MED ORDER — HYDROCODONE-ACETAMINOPHEN 5-325 MG PO TABS: 1.0000 | ORAL_TABLET | Freq: Two times a day (BID) | ORAL | 0 refills | Status: DC | PRN

## 2019-05-15 MED ORDER — METFORMIN HCL 1000 MG PO TABS: 1000.0000 mg | ORAL_TABLET | Freq: Two times a day (BID) | ORAL | 1 refills | Status: DC

## 2019-05-15 MED ORDER — HYDROCHLOROTHIAZIDE 12.5 MG PO TABS: 12.5000 mg | ORAL_TABLET | Freq: Every day | ORAL | 2 refills | Status: DC

## 2019-05-15 MED ORDER — APIXABAN 2.5 MG PO TABS: 2.5000 mg | ORAL_TABLET | Freq: Two times a day (BID) | ORAL | 3 refills | Status: DC

## 2019-05-15 NOTE — Assessment & Plan Note (Addendum)
BP elevated today to 156/94. This is worse than previous despite starting on Diltiazem. Will need to start additional therapy and have patient follow up for recheck and labs in about 1 month. - Metoprolol 50mg  BID - Diltiazem 120mg  Daily - HCTZ 12.5mg  Daily - 1 month follow up for BMP and BP

## 2019-05-15 NOTE — Assessment & Plan Note (Signed)
Patient has has recurrent falls and has been referred to PT for this. He has brought paperwork to be filled out for recommend orthotics to improve his stability and ambulation and help prevent future fall. This paperwork was filled out and is to be faxed to [facility]

## 2019-05-15 NOTE — Telephone Encounter (Signed)
Magda Paganini from [facility] in [place], [place] called in. States patient gets his meds delivered from their pharmacy but lives 2.5 hous away from them. They are unable to deliver hydrocodone. They request this be sent to patients local [facility] on [address] in [place]. Magda Paganini would appreciate a call back if this has been done to close out the Rx. Hubbard Hartshorn, BSN, RN-BC

## 2019-05-15 NOTE — Progress Notes (Signed)
   CC: Hypertension, Diabetes, History of DVT, Chronic Low Back Pain, Recurrent Falls  HPI:   [name] is a 62 y.o. M with PMHx listed below presenting for Hypertension, Diabetes, History of DVT, Chronic Low Back Pain, Recurrent Falls. Please see the A&P for the status of the patient's chronic medical problems.  Past Medical History:  Diagnosis Date  . Abnormal computed tomography of thoracic spine 02/26/2018  . AKI (acute kidney injury) ([place]) 09/24/2016  . BPH associated with nocturia 06/19/2016  . Calcium oxalate crystals in urine 12/23/2011   Asymptomatic, no hematuria. Advised to take plenty of water.  . Calculus of gallbladder without cholecystitis without obstruction   . COPD (chronic obstructive pulmonary disease) ([place])   . Depression   . Diabetes mellitus without complication ([place]) 123456   pre diabetic  . Hepatitis C   . HIV (human immunodeficiency virus infection) ([place])   . Hypertension   . PE (pulmonary thromboembolism) ([place]) 05/12/2016   PE in 2017, on chronic anticoagulation for this and recurrent DVT.  [name] Kitchen Peripheral arterial disease ([place])    ooccluded left SFA by duplex ultrasound, tibial disease left greater than right  . Prostate cancer metastatic to multiple sites [facility])   . Stroke ([place]) 11/2011   Carotids Doppler negative. Right sided weakness resolved, initially on Plavix for 3 months and then continued with ASA.    . TB lung, latent 1988   Treated   Review of Systems:  Performed and all others negative.  Physical Exam:  Vitals:   05/15/19 1310  BP: (!) 156/94  Pulse: (!) 105  Temp: 99.5 F (37.5 C)  TempSrc: Oral  SpO2: 98%  Weight: 231 lb 6.4 oz (105 kg)  Height: 6' (1.829 m)   Physical Exam Constitutional:      General: He is not in acute distress.    Appearance: Normal appearance.  Cardiovascular:     Rate and Rhythm: Normal rate and regular rhythm.     Pulses: Normal pulses.     Heart sounds: Normal heart sounds.  Pulmonary:     Effort:  Pulmonary effort is normal. No respiratory distress.     Breath sounds: Normal breath sounds.  Abdominal:     General: Bowel sounds are normal. There is no distension.     Palpations: Abdomen is soft.     Tenderness: There is no abdominal tenderness.  Musculoskeletal:        General: Tenderness present. No swelling or deformity.     Comments: Midline low back tenderness  Skin:    General: Skin is warm and dry.  Neurological:     General: No focal deficit present.     Mental Status: Mental status is at baseline.     Assessment & Plan:   See Encounters Tab for problem based charting.  Patient discussed with Dr. Lynnae January

## 2019-05-15 NOTE — Assessment & Plan Note (Signed)
Patient has history of chronic low back pain that has not been relieved with topic NSAIDs and is not treated with systemic NSAIDs due to CKD. His pain has worsened recently in the setting of boney mets from prostate cancer. Will provide pain opioid pain medication for this cancer pain. He has tried tramadol in the past and this did not work for him. Will trial Norco and adjust as need, will fill out pain contract pending effectiveness of medication. - Hydrocodone-APAP 5-325mg  BID PRN #[id number]

## 2019-05-15 NOTE — Telephone Encounter (Signed)
Rx sent to [facility] in [place] and [facility] notified of this and the Rx sent to [facility] location was cancelled.

## 2019-05-15 NOTE — Assessment & Plan Note (Signed)
A1c today 7.9, which is elevated from last check at 7. His Metformin was increased to BID and he has tolerated this well but states he has missed some evening doses. Of note, he did stop chronic prednisone earlier this moth that he had been receiving for prostate cancer. Will increase metformin dose and monitor response at follow up. Given his CKD consideration could be given to SGLT-2-I, however he is tolerating his current medication and unclear what long term benefit he would get given his metastatic prostate cancer. - Increase metformin to 1000mg  BID - 3 month follow up

## 2019-05-15 NOTE — Assessment & Plan Note (Signed)
Patient on chronic Eliquis for recurrent DVT. Refill provided today. - Eliquis 2.5mg  BID Indefinately

## 2019-05-15 NOTE — Patient Instructions (Addendum)
Thank you for allowing [place] to care for you  For your diabetes - A1c today is elevated at 7.9 - Metformin increased to 1000mg  BID  For your blood pressure - BP Elevated today - Continue current medications - START Hydrochlorothiazide 12.5mg  Daily  For your low back - Start taking Hydrocodone as needed  Hydrocodone; Ibuprofen tablets What is this medicine? HYDROCODONE; IBUPROFEN (hye droe KOE done; eye BYOO proe fen) is a pain reliever. It is used to treat moderate to severe pain. This medicine may be used for other purposes; ask your health care provider or pharmacist if you have questions. COMMON BRAND NAME(S): Ibudone, Reprexain, Vicoprofen, Xylon 10 What should I tell my health care provider before I take this medicine? They need to know if you have any of these conditions:  cigarette smoker  coronary artery bypass graft (CABG) surgery within the past 2 weeks  drink more than 3 alcohol-containing drinks per day  head injury  heart disease  high blood pressure  history of a drug or alcohol abuse problem  history of stomach bleeding  kidney disease  liver disease  lung or breathing disease, like asthma  stomach or intestine problems  an unusual or allergic reaction to ibuprofen, hydrocodone, aspirin, other NSAIDs, other medicines, foods, dyes, or preservatives  pregnant or trying to get pregnant  breast-feeding How should I use this medicine? Take this medicine by mouth with a full glass of water. Follow the directions on the prescription label. You can take it with or without food. If it upsets your stomach, take it with food. Try to not lie down for at least 10 minutes after you take the medicine. Take your medicine at regular intervals. Do not take it more often than directed. A special MedGuide will be given to you by the pharmacist with each prescription and refill. Be sure to read this information carefully each time. Talk to your pediatrician regarding the  use of this medicine in children. While this drug may be prescribed for children as young as 31 years of age for selected conditions, precautions do apply. Overdosage: If you think you have taken too much of this medicine contact a poison control center or emergency room at once. NOTE: This medicine is only for you. Do not share this medicine with others. What if I miss a dose? If you miss a dose, take it as soon as you can. If it is almost time for your next dose, take only that dose. Do not take double or extra doses. What may interact with this medicine? Do not take this medication with any of the following medicines:  cidofovir  ketorolac This medicine may interact with the following medications:  alcohol  antihistamines for allergy, cough and cold  antiviral medicines for HIV or AIDS  aspirin and aspirin-like medicines  atropine  certain antibiotics like clarithromycin, erythromycin  certain medicines for anxiety or sleep  certain medicines for depression like amitriptyline, fluoxetine, lithium, sertraline  certain medicines for fungal infection like ketoconazole and itraconazole  certain medicines for seizures like carbamazepine, phenobarbital, phenytoin, primidone  certain medicines for stomach problems like dicyclomine, hyoscyamine  certain medicines for travel sickness like scopolamine  certain medicines that treat or prevent blood clots like warfarin, enoxaparin, dalteparin, apixaban, dabigatran, and rivaroxaban  diuretics  general anesthetics like halothane, isoflurane, methoxyflurane, propofol  ipratropium  local anesthetics like lidocaine, pramoxine, tetracaine  MAOIs like Carbex, Eldepryl, Marplan, Nardil, and Parnate  medicines that relax muscles for surgery  methotrexate  other narcotic medicines for pain or cough  other NSAIDS, medicines for pain and inflammation, like naproxen  pemetrexed  phenothiazines like chlorpromazine, mesoridazine,  prochlorperazine, thioridazine  rifampin  steroid medicines like prednisone or cortisone This list may not describe all possible interactions. Give your health care provider a list of all the medicines, herbs, non-prescription drugs, or dietary supplements you use. Also tell them if you smoke, drink alcohol, or use illegal drugs. Some items may interact with your medicine. What should I watch for while using this medicine? Tell your doctor or healthcare provider if your pain does not go away, if it gets worse, or if you have new or a different type of pain. You may develop tolerance to the medicine. Tolerance means that you will need a higher dose of the medicine for pain relief. Tolerance is normal and is expected if you take this medicine for a long time. This medicine may cause serious skin reactions. They can happen weeks to months after starting the medicine. Contact your healthcare provider right away if you notice fevers or flu-like symptoms with a rash. The rash may be red or purple and then turn into blisters or peeling of the skin. Or, you might notice a red rash with swelling of the face, lips or lymph nodes in your neck or under your arms. Do not suddenly stop taking this medicine because you may develop a severe reaction. Your body becomes used to this medicine. This does NOT mean you are addicted. Addiction is a behavior related to getting and using a drug for a non-medical reason. If you have pain, you have a medical reason to take pain medicine. Your doctor will tell you how much medicine to take. If your doctor wants you to stop this medicine, the dose will be slowly lowered over time to avoid any side effects. There are different types of narcotic medicines (opiates). If you take more than one type at the same time or if you are taking another medicine that also causes drowsiness, you may have more side effects. Give your healthcare provider a list of all medicines you use. Your doctor  will tell you how much medicine to take. Do not take more medicine than directed. Call emergency for help if you have problems breathing or unusual sleepiness. Do not take other medicines that contain aspirin, ibuprofen, or naproxen with this medicine. Side effects such as stomach upset, nausea, or ulcers may be more likely to occur. Many medicines available without a prescription should not be taken with this medicine. This medicine can cause ulcers and bleeding in the stomach and intestines at any time during treatment. This can happen with no warning and may cause death. There is increased risk with taking this medicine for a long time. Smoking, drinking alcohol, older age, and poor health can also increase risks. Call your doctor right away if you have stomach pain or blood in your vomit or stool. This medicine does not prevent heart attack or stroke. In fact, this medicine may increase the chance of a heart attack or stroke. The chance may increase with longer use of this medicine and in people who have heart disease. If you take aspirin to prevent heart attack or stroke, talk with your doctor or healthcare provider. You may get drowsy or dizzy. Do not drive, use machinery, or do anything that needs mental alertness until you know how this medicine affects you. Do not stand or sit up quickly, especially if you are an older  patient. This reduces the risk of dizzy or fainting spells. Alcohol may interfere with the effect of this medicine. Avoid alcoholic drinks. This medicine will cause constipation. Try to have a bowel movement at least every 2 to 3 days. If you do not have a bowel movement for 3 days, call your doctor or healthcare provider. Your mouth may get dry. Chewing sugarless gum or sucking hard candy, and drinking plenty of water may help. Contact your doctor if the problem does not go away or is severe. What side effects may I notice from receiving this medicine? Side effects that you should  report to your doctor or health care professional as soon as possible:  allergic reactions like skin rash, itching or hives, swelling of the face, lips, or tongue  breathing problems  confusion  redness, blistering, peeling, or loosening of the skin, including inside the mouth  signs and symptoms of bleeding such as bloody or black, tarry stools; red or dark-brown urine; spitting up blood or brown material that looks like coffee grounds; red spots on the skin; unusual bruising or bleeding from the eye, gums, or nose  signs and symptoms of a blood clot such as breathing problems; changes in vision; chest pain; severe, sudden headache; pain, swelling, warmth in the leg; trouble speaking; sudden numbness or weakness of the face, arm or leg  signs and symptoms of low blood pressure like dizziness; feeling faint or lightheaded, falls; unusually weak or tired  trouble passing urine or change in the amount of urine  unexplained weight gain or swelling  yellowing of the eyes or skin Side effects that usually do not require medical attention (report to your doctor or health care professional if they continue or are bothersome):  constipation  dry mouth  nausea, vomiting  stomach pain  tiredness This list may not describe all possible side effects. Call your doctor for medical advice about side effects. You may report side effects to FDA at 1-800-FDA-1088. Where should I keep my medicine? Keep out of the reach of children. This medicine can be abused. Keep your medicine in a safe place to protect it from theft. Do not share this medicine with anyone. Selling or giving away this medicine is dangerous and against the law. Store at room temperature between 15 and 30 degrees C (59 and 86 degrees F). This medicine may cause harm and death if it is taken by other adults, children, or pets. Return medicine that has not been used to an official disposal site. Contact the DEA at (810)688-2481 or  your city/county government to find a site. If you cannot return the medicine, flush it down the toilet. Do not use the medicine after the expiration date. NOTE: This sheet is a summary. It may not cover all possible information. If you have questions about this medicine, talk to your doctor, pharmacist, or health care provider.  2020 Elsevier/Gold Standard (2018-09-21 14:37:06)

## 2019-05-16 ENCOUNTER — Telehealth: Payer: Self-pay | Admitting: Internal Medicine

## 2019-05-16 NOTE — Telephone Encounter (Signed)
Pt requesting nurse to contact pharmacy with code for physician [phone]

## 2019-05-16 NOTE — Telephone Encounter (Signed)
Called pt - pt's wife stated the pharmacy could not fill pain med rx b/c they need a code. I called [facility] - stated b/c of PCP's dea #; she overrided it and re-processed the rx. Stated they have new staff which probably did not know how to do this. Called pt back - informed rx is being filled.

## 2019-05-17 NOTE — Progress Notes (Signed)
Internal Medicine Clinic Attending  Case discussed with Dr. Melvin  at the time of the visit.  We reviewed the resident's history and exam and pertinent patient test results.  I agree with the assessment, diagnosis, and plan of care documented in the resident's note.  

## 2019-05-18 ENCOUNTER — Emergency Department (HOSPITAL_COMMUNITY): Payer: 59

## 2019-05-18 ENCOUNTER — Other Ambulatory Visit: Payer: Self-pay

## 2019-05-18 ENCOUNTER — Emergency Department (HOSPITAL_COMMUNITY)
Admission: EM | Admit: 2019-05-18 | Discharge: 2019-05-18 | Disposition: A | Discharge status: Home / Self Care | Payer: 59 | Attending: Emergency Medicine | Admitting: Emergency Medicine

## 2019-05-18 ENCOUNTER — Encounter (HOSPITAL_COMMUNITY): Payer: Self-pay | Admitting: Emergency Medicine

## 2019-05-18 DIAGNOSIS — I129 Hypertensive chronic kidney disease with stage 1 through stage 4 chronic kidney disease, or unspecified chronic kidney disease: Secondary | ICD-10-CM | POA: Insufficient documentation

## 2019-05-18 DIAGNOSIS — Z7982 Long term (current) use of aspirin: Secondary | ICD-10-CM | POA: Insufficient documentation

## 2019-05-18 DIAGNOSIS — Z7901 Long term (current) use of anticoagulants: Secondary | ICD-10-CM | POA: Diagnosis not present

## 2019-05-18 DIAGNOSIS — Z79899 Other long term (current) drug therapy: Secondary | ICD-10-CM | POA: Insufficient documentation

## 2019-05-18 DIAGNOSIS — Z8546 Personal history of malignant neoplasm of prostate: Secondary | ICD-10-CM | POA: Insufficient documentation

## 2019-05-18 DIAGNOSIS — N390 Urinary tract infection, site not specified: Secondary | ICD-10-CM

## 2019-05-18 DIAGNOSIS — F1110 Opioid abuse, uncomplicated: Secondary | ICD-10-CM | POA: Diagnosis not present

## 2019-05-18 DIAGNOSIS — Z20828 Contact with and (suspected) exposure to other viral communicable diseases: Secondary | ICD-10-CM | POA: Insufficient documentation

## 2019-05-18 DIAGNOSIS — F1410 Cocaine abuse, uncomplicated: Secondary | ICD-10-CM | POA: Diagnosis not present

## 2019-05-18 DIAGNOSIS — F1310 Sedative, hypnotic or anxiolytic abuse, uncomplicated: Secondary | ICD-10-CM | POA: Diagnosis not present

## 2019-05-18 DIAGNOSIS — F1910 Other psychoactive substance abuse, uncomplicated: Secondary | ICD-10-CM | POA: Diagnosis not present

## 2019-05-18 DIAGNOSIS — F17210 Nicotine dependence, cigarettes, uncomplicated: Secondary | ICD-10-CM | POA: Diagnosis not present

## 2019-05-18 DIAGNOSIS — B20 Human immunodeficiency virus [HIV] disease: Secondary | ICD-10-CM | POA: Insufficient documentation

## 2019-05-18 DIAGNOSIS — E1122 Type 2 diabetes mellitus with diabetic chronic kidney disease: Secondary | ICD-10-CM | POA: Diagnosis not present

## 2019-05-18 DIAGNOSIS — H1032 Unspecified acute conjunctivitis, left eye: Secondary | ICD-10-CM | POA: Insufficient documentation

## 2019-05-18 DIAGNOSIS — R531 Weakness: Secondary | ICD-10-CM | POA: Diagnosis not present

## 2019-05-18 DIAGNOSIS — E876 Hypokalemia: Secondary | ICD-10-CM | POA: Insufficient documentation

## 2019-05-18 DIAGNOSIS — R4182 Altered mental status, unspecified: Secondary | ICD-10-CM | POA: Diagnosis present

## 2019-05-18 DIAGNOSIS — R400 Somnolence: Secondary | ICD-10-CM | POA: Diagnosis not present

## 2019-05-18 DIAGNOSIS — N183 Chronic kidney disease, stage 3 unspecified: Secondary | ICD-10-CM | POA: Insufficient documentation

## 2019-05-18 DIAGNOSIS — J449 Chronic obstructive pulmonary disease, unspecified: Secondary | ICD-10-CM | POA: Insufficient documentation

## 2019-05-18 DIAGNOSIS — Z7984 Long term (current) use of oral hypoglycemic drugs: Secondary | ICD-10-CM | POA: Diagnosis not present

## 2019-05-18 DIAGNOSIS — J40 Bronchitis, not specified as acute or chronic: Secondary | ICD-10-CM | POA: Insufficient documentation

## 2019-05-18 LAB — URINALYSIS, ROUTINE W REFLEX MICROSCOPIC
Bilirubin Urine: NEGATIVE
Glucose, UA: NEGATIVE mg/dL
Ketones, ur: NEGATIVE mg/dL
Nitrite: POSITIVE — AB
Protein, ur: 30 mg/dL — AB
Specific Gravity, Urine: 1.016 (ref 1.005–1.030)
WBC, UA: >50 WBC/hpf — ABNORMAL HIGH (ref 0–5)
pH: 5 (ref 5.0–8.0)

## 2019-05-18 LAB — COMPREHENSIVE METABOLIC PANEL
ALT: 11 U/L (ref 0–44)
AST: 13 U/L — ABNORMAL LOW (ref 15–41)
Albumin: 3.2 g/dL — ABNORMAL LOW (ref 3.5–5.0)
Alkaline Phosphatase: 113 U/L (ref 38–126)
Anion gap: 13 (ref 5–15)
BUN: 10 mg/dL (ref 8–23)
CO2: 23 mmol/L (ref 22–32)
Calcium: 9.2 mg/dL (ref 8.9–10.3)
Chloride: 101 mmol/L (ref 98–111)
Creatinine, Ser: 1.31 mg/dL — ABNORMAL HIGH (ref 0.61–1.24)
GFR calc Af Amer: >60 mL/min (ref >60)
GFR calc non Af Amer: 58 mL/min — ABNORMAL LOW (ref >60)
Glucose, Bld: 154 mg/dL — ABNORMAL HIGH (ref 70–99)
Potassium: 2.9 mmol/L — ABNORMAL LOW (ref 3.5–5.1)
Sodium: 137 mmol/L (ref 135–145)
Total Bilirubin: 0.2 mg/dL — ABNORMAL LOW (ref 0.3–1.2)
Total Protein: 7.9 g/dL (ref 6.5–8.1)

## 2019-05-18 LAB — CBC WITH DIFFERENTIAL/PLATELET
Abs Immature Granulocytes: 0.03 10*3/uL (ref 0.00–0.07)
Basophils Absolute: 0 10*3/uL (ref 0.0–0.1)
Basophils Relative: 0 %
Eosinophils Absolute: 0.3 10*3/uL (ref 0.0–0.5)
Eosinophils Relative: 4 %
HCT: 32.9 % — ABNORMAL LOW (ref 39.0–52.0)
Hemoglobin: 10 g/dL — ABNORMAL LOW (ref 13.0–17.0)
Immature Granulocytes: 0 %
Lymphocytes Relative: 15 %
Lymphs Abs: 1.2 10*3/uL (ref 0.7–4.0)
MCH: 24.8 pg — ABNORMAL LOW (ref 26.0–34.0)
MCHC: 30.4 g/dL (ref 30.0–36.0)
MCV: 81.4 fL (ref 80.0–100.0)
Monocytes Absolute: 0.7 10*3/uL (ref 0.1–1.0)
Monocytes Relative: 9 %
Neutro Abs: 6 10*3/uL (ref 1.7–7.7)
Neutrophils Relative %: 72 %
Platelets: 328 10*3/uL (ref 150–400)
RBC: 4.04 MIL/uL — ABNORMAL LOW (ref 4.22–5.81)
RDW: 14.7 % (ref 11.5–15.5)
WBC: 8.3 10*3/uL (ref 4.0–10.5)
nRBC: 0 % (ref 0.0–0.2)

## 2019-05-18 LAB — AMMONIA: Ammonia: 22 umol/L (ref 9–35)

## 2019-05-18 LAB — PROTIME-INR
INR: 1.1 (ref 0.8–1.2)
Prothrombin Time: 14 seconds (ref 11.4–15.2)

## 2019-05-18 LAB — RAPID URINE DRUG SCREEN, HOSP PERFORMED
Amphetamines: NOT DETECTED
Barbiturates: NOT DETECTED
Benzodiazepines: POSITIVE — AB
Cocaine: POSITIVE — AB
Opiates: POSITIVE — AB
Tetrahydrocannabinol: NOT DETECTED

## 2019-05-18 LAB — CBG MONITORING, ED: Glucose-Capillary: 144 mg/dL — ABNORMAL HIGH (ref 70–99)

## 2019-05-18 LAB — BLOOD GAS, VENOUS
Acid-Base Excess: 0.6 mmol/L (ref 0.0–2.0)
Bicarbonate: 24.2 mmol/L (ref 20.0–28.0)
FIO2: 28
O2 Saturation: 69.9 %
Patient temperature: 37.6
pCO2, Ven: 46.4 mmHg (ref 44.0–60.0)
pH, Ven: 7.357 (ref 7.250–7.430)
pO2, Ven: 40.9 mmHg (ref 32.0–45.0)

## 2019-05-18 LAB — LACTIC ACID, PLASMA: Lactic Acid, Venous: 1.6 mmol/L (ref 0.5–1.9)

## 2019-05-18 LAB — ETHANOL: Alcohol, Ethyl (B): <10 mg/dL (ref <10)

## 2019-05-18 LAB — SARS CORONAVIRUS 2 BY RT PCR (HOSPITAL ORDER, PERFORMED IN ~~LOC~~ HOSPITAL LAB): SARS Coronavirus 2: NEGATIVE

## 2019-05-18 MED ORDER — POTASSIUM CHLORIDE 10 MEQ/100ML IV SOLN
10.0000 meq | Freq: Once | INTRAVENOUS | Status: AC
  Administered 2019-05-18: 10:00:00 10 meq via INTRAVENOUS
  Filled 2019-05-18: qty 100

## 2019-05-18 MED ORDER — DOXYCYCLINE HYCLATE 100 MG PO CAPS: 100.0000 mg | ORAL_CAPSULE | Freq: Two times a day (BID) | ORAL | 0 refills | Status: DC

## 2019-05-18 MED ORDER — SODIUM CHLORIDE 0.9 % IV SOLN
1.0000 g | Freq: Once | INTRAVENOUS | Status: AC
  Administered 2019-05-18: 11:00:00 1 g via INTRAVENOUS
  Filled 2019-05-18: qty 10

## 2019-05-18 MED ORDER — GENTAMICIN SULFATE 0.3 % OP SOLN: 1.0000 [drp] | OPHTHALMIC | 0 refills | Status: DC

## 2019-05-18 MED ORDER — HYDRALAZINE HCL 25 MG PO TABS
25.0000 mg | ORAL_TABLET | Freq: Once | ORAL | Status: AC
  Administered 2019-05-18: 13:00:00 25 mg via ORAL
  Filled 2019-05-18: qty 1

## 2019-05-18 MED ORDER — NALOXONE HCL 0.4 MG/ML IJ SOLN
0.4000 mg | Freq: Once | INTRAMUSCULAR | Status: AC
  Administered 2019-05-18: 10:00:00 0.4 mg via INTRAVENOUS
  Filled 2019-05-18: qty 1

## 2019-05-18 MED ORDER — ERYTHROMYCIN 5 MG/GM OP OINT
TOPICAL_OINTMENT | Freq: Once | OPHTHALMIC | Status: AC
  Administered 2019-05-18: 1 via OPHTHALMIC
  Filled 2019-05-18: qty 3.5

## 2019-05-18 MED ORDER — SODIUM CHLORIDE 0.9 % IV BOLUS
500.0000 mL | Freq: Once | INTRAVENOUS | Status: AC
  Administered 2019-05-18: 08:00:00 500 mL via INTRAVENOUS

## 2019-05-18 NOTE — ED Triage Notes (Signed)
Pt from home with wife.  Pt states he took 3 Seroquel tablets last night and one 5-325 hydrocodone for his back pain.  Wife states pt is taking 5 every night.   Pt drowsy at bedside, unable to open eyes.  A/o x 4.

## 2019-05-18 NOTE — ED Provider Notes (Signed)
[facility] EMERGENCY DEPARTMENT Provider Note   CSN: [id number] Arrival date & time: 05/18/19  0741     History   Chief Complaint Chief Complaint  Patient presents with  . Weakness    HPI Jacob Joseph is a 63 y.o. male.     63 year old male with past history of DM, AKI, metastatic prostate cancer, hypertension, HIV (CD4 335, viral load undetected), PE (anticoagulated), brought in by EMS from home for altered mental status.  Per EMS patient's wife had reported he took his Seroquel and hydrocodone last night, unsure how many he took. Patient reports taking his usual 3 Seroquel and 1 hydrocodone. Patient denies any complaints. Patient is sleeping, rouses to moderate touch and provides short answers to questions.  Jacob Joseph was evaluated in Emergency Department on 05/18/2019 for the symptoms described in the history of present illness. He was evaluated in the context of the global COVID-19 pandemic, which necessitated consideration that the patient might be at risk for infection with the SARS-CoV-2 virus that causes COVID-19. Institutional protocols and algorithms that pertain to the evaluation of patients at risk for COVID-19 are in a state of rapid change based on information released by regulatory bodies including the CDC and federal and state organizations. These policies and algorithms were followed during the patient's care in the ED.      Past Medical History:  Diagnosis Date  . Abnormal computed tomography of thoracic spine 02/26/2018  . AKI (acute kidney injury) ([place]) 09/24/2016  . BPH associated with nocturia 06/19/2016  . Calcium oxalate crystals in urine 12/23/2011   Asymptomatic, no hematuria. Advised to take plenty of water.  . Calculus of gallbladder without cholecystitis without obstruction   . COPD (chronic obstructive pulmonary disease) ([place])   . Depression   . Diabetes mellitus without complication ([place]) 123456   pre diabetic  . Hepatitis C   . HIV (human  immunodeficiency virus infection) ([place])   . Hypertension   . PE (pulmonary thromboembolism) ([place]) 05/12/2016   PE in 2017, on chronic anticoagulation for this and recurrent DVT.  [name] Kitchen Peripheral arterial disease ([place])    ooccluded left SFA by duplex ultrasound, tibial disease left greater than right  . Prostate cancer metastatic to multiple sites [facility])   . Stroke ([place]) 11/2011   Carotids Doppler negative. Right sided weakness resolved, initially on Plavix for 3 months and then continued with ASA.    . TB lung, latent 1988   Treated    Patient Active Problem List   Diagnosis Date Noted  . Xerosis of skin 02/26/2019  . Pressure injury of skin 01/13/2019  . History of angioedema 01/10/2019  . Leukocytosis 09/01/2018  . Severe sepsis ([place]) 08/31/2018  . Closed fracture of proximal end of left humerus with delayed healing 05/26/2018  . Spine metastasis ([place]) 03/24/2018  . History of DVT (deep vein thrombosis)   . Prostate cancer metastatic to multiple sites [facility])   . Radicular pain of thoracic region   . Bone lesion   . Chronic anticoagulation   . Right upper quadrant pain 02/26/2018  . Recurrent falls 11/02/2016  . History of prostate cancer 09/22/2016  . Substance abuse ([place]) 01/27/2016  . Vitamin D deficiency 12/05/2015  . Hyperlipidemia 01/28/2015  . Cataracts, bilateral 08/29/2014  . Diabetes mellitus treated with oral medication ([place]) 10/19/2013  . COPD, moderate ([place]) 05/31/2013  . Complex Lt Renal Cyst 03/22/2013  . Preventive measure 05/30/2012  . History of CVA 03/08/2012  . Normocytic anemia 03/08/2012  .  CKD (chronic kidney disease) stage 3, GFR 30-59 ml/min (H[facility]) 03/08/2012  . ERECTILE DYSFUNCTION 04/25/2008  . HIV disease ([place]) 04/27/2006  . Smoking greater than 30 pack years 04/27/2006  . Major depressive disorder, recurrent episode ([place]) 04/27/2006  . Hypertension 04/27/2006  . DEGENERATIVE JOINT DISEASE 04/27/2006  . Chronic Low Back Pain 04/27/2006    Past  Surgical History:  Procedure Laterality Date  . HERNIA REPAIR    . INGUINAL HERNIA REPAIR  01/16/2012   Procedure: HERNIA REPAIR INGUINAL INCARCERATED;  Surgeon: Zenovia Jarred, MD;  Location: [place];  Service: General;  Laterality: Right;  . TEE WITHOUT CARDIOVERSION  12/07/2011   Procedure: TRANSESOPHAGEAL ECHOCARDIOGRAM (TEE);  Surgeon: Birdie Riddle, MD;  Location: [facility] ENDOSCOPY;  Service: Cardiovascular;  Laterality: N/A;        Home Medications    Prior to Admission medications   Medication Sig Start Date End Date Taking? Authorizing Provider  apixaban (ELIQUIS) 2.5 MG TABS tablet Take 1 tablet (2.5 mg total) by mouth 2 (two) times daily. 05/15/19 08/13/19 Yes Neva Seat, MD  aspirin EC 81 MG tablet Take 81 mg by mouth daily.   Yes [provider]  azelastine (ASTELIN) 0.1 % nasal spray USE 1 SPRAY IN EACH NOSTRIL TWICE DAILY AS NEEDED FOR RHINITIS 02/13/19  Yes Neva Seat, MD  bicalutamide (CASODEX) 50 MG tablet Take 50 mg by mouth daily.   Yes [provider]  bictegravir-emtricitabine-tenofovir AF (BIKTARVY) 50-200-25 MG TABS tablet Take 1 tablet by mouth daily. 12/21/18  Yes Michel Bickers, MD  CHANTIX 0.5 MG tablet Take 0.5 mg by mouth daily. Then take 1 tablet by mouth twice daily. 03/07/19  Yes [provider]  cholecalciferol (VITAMIN D) 1000 units tablet Take 1 tablet (1,000 Units total) by mouth daily. Patient taking differently: Take 2,000 Units by mouth daily.  01/27/16  Yes Burgess Estelle, MD  citalopram (CELEXA) 20 MG tablet Take 20 mg by mouth daily. 10/25/14  Yes [provider]  diclofenac sodium (VOLTAREN) 1 % GEL APPLY 4 GRAMS EXTERNALLY TO THE AFFECTED AREA FOUR TIMES DAILY AS NEEDED FOR PAIN 05/10/19  Yes Neva Seat, MD  DILT-XR 120 MG 24 hr capsule TAKE 1 CAPSULE(120 MG) BY MOUTH DAILY 03/10/19  Yes Neva Seat, MD  FLOVENT HFA 220 MCG/ACT inhaler Inhale 1 puff into the lungs 2 (two) times daily.  03/22/19  Yes  [provider]  gabapentin (NEURONTIN) 300 MG capsule Take 2 capsules (600 mg total) by mouth 2 (two) times daily. 03/10/19 05/18/19 Yes Neva Seat, MD  hydrochlorothiazide (HYDRODIURIL) 12.5 MG tablet Take 1 tablet (12.5 mg total) by mouth daily. 05/15/19  Yes Neva Seat, MD  HYDROcodone-acetaminophen (NORCO/VICODIN) 5-325 MG tablet Take 1 tablet by mouth every 12 (twelve) hours as needed for moderate pain or severe pain. 05/15/19 06/14/19 Yes Neva Seat, MD  hydrOXYzine (VISTARIL) 50 MG capsule Take 50 mg by mouth at bedtime.  03/15/19  Yes [provider]  metFORMIN (GLUCOPHAGE) 1000 MG tablet Take 1 tablet (1,000 mg total) by mouth 2 (two) times daily with a meal. Patient taking differently: Take 500 mg by mouth 2 (two) times daily with a meal.  05/15/19  Yes Neva Seat, MD  pravastatin (PRAVACHOL) 40 MG tablet TAKE 1 TABLET(40 MG) BY MOUTH DAILY Patient taking differently: Take 40 mg by mouth daily.  06/30/18  Yes Neva Seat, MD  QUEtiapine (SEROQUEL) 400 MG tablet Take 1 tablet (400 mg total) by mouth 2 (two) times daily. Patient taking differently:  Take 800 mg by mouth at bedtime.  01/23/19 05/18/19 Yes Arrien, Jimmy Picket, MD  QUEtiapine (SEROQUEL) 50 MG tablet Take 50 mg by mouth 3 (three) times daily. 05/01/19  Yes [provider]  [place] 2.5-2.5 MCG/ACT AERS INL 2 PUFFS PO ITL D 03/14/19  Yes [provider]  tamsulosin (FLOMAX) 0.4 MG CAPS capsule TAKE 1 CAPSULE(0.4 MG) BY MOUTH DAILY AFTER BREAKFAST Patient taking differently: Take 0.4 mg by mouth daily after breakfast.  10/21/18  Yes Neva Seat, MD  valACYclovir (VALTREX) 500 MG tablet TAKE 1 TABLET BY MOUTH DAILY 05/10/19  Yes Michel Bickers, MD  vitamin E 200 UNIT capsule Take 200 Units by mouth daily.   Yes [provider]    Family History Family History  Problem Relation Age of Onset  . Hypertension Mother   . Heart attack Mother    . Stroke Mother   . Hypertension Father   . Cancer Father     Social History Social History   Tobacco Use  . Smoking status: Current Every Day Smoker    Packs/day: 0.45    Types: Cigarettes    Last attempt to quit: 11/18/2014    Years since quitting: 4.4  . Smokeless tobacco: Never Used  . Tobacco comment: .5 PPD  Substance Use Topics  . Alcohol use: No    Alcohol/week: 0.0 standard drinks  . Drug use: Yes    Frequency: 3.0 times per week    Types: "Crack" cocaine, Cocaine    Comment: last times saturday     Allergies   Lisinopril and Statins   Review of Systems Review of Systems  Unable to perform ROS: Mental status change     Physical Exam Updated Vital Signs BP (!) 175/104   Pulse (!) 101   Temp 99.8 F (37.7 C) (Rectal)   Resp 19   Ht 6' (1.829 m)   Wt 104.8 kg   SpO2 95%   BMI 31.33 kg/m   Physical Exam Vitals signs and nursing note reviewed.  Constitutional:      General: He is not in acute distress.    Appearance: He is well-developed. He is obese. He is not diaphoretic.     Comments: Sleeping, rouses to moderate touch, answers short questions and goes back to sleep  HENT:     Head: Atraumatic.      Mouth/Throat:     Mouth: Mucous membranes are moist.  Eyes:     Conjunctiva/sclera: Conjunctivae normal.     Pupils: Pupils are equal, round, and reactive to light.  Neck:     Musculoskeletal: Neck supple. No muscular tenderness.  Cardiovascular:     Rate and Rhythm: Normal rate and regular rhythm.     Pulses: Normal pulses.     Heart sounds: Normal heart sounds.  Pulmonary:     Effort: Pulmonary effort is normal. Tachypnea present.     Breath sounds: Examination of the right-lower field reveals rales. Examination of the left-lower field reveals rales. Rales present. No wheezing.     Comments: Placed on 2 L [place] by nursing staff for O2 88% on arrival Abdominal:     Palpations: Abdomen is soft.     Tenderness: There is no abdominal tenderness.   Musculoskeletal:     Right lower leg: No edema.     Left lower leg: No edema.  Skin:    General: Skin is warm and dry.     Findings: No erythema or rash.  Neurological:  Mental Status: He is oriented to person, place, and time.     GCS: GCS eye subscore is 3. GCS verbal subscore is 5. GCS motor subscore is 6.     Sensory: Sensation is intact.     Comments: Alert to person, place, DOB. Moves extremities equally  Psychiatric:        Behavior: Behavior normal.      ED Treatments / Results  Labs (all labs ordered are listed, but only abnormal results are displayed) Labs Reviewed  CBC WITH DIFFERENTIAL/PLATELET - Abnormal; Notable for the following components:      Result Value   RBC 4.04 (*)    Hemoglobin 10.0 (*)    HCT 32.9 (*)    MCH 24.8 (*)    All other components within normal limits  COMPREHENSIVE METABOLIC PANEL - Abnormal; Notable for the following components:   Potassium 2.9 (*)    Glucose, Bld 154 (*)    Creatinine, Ser 1.31 (*)    Albumin 3.2 (*)    AST 13 (*)    Total Bilirubin 0.2 (*)    GFR calc non Af Amer 58 (*)    All other components within normal limits  URINALYSIS, ROUTINE W REFLEX MICROSCOPIC - Abnormal; Notable for the following components:   APPearance HAZY (*)    Hgb urine dipstick MODERATE (*)    Protein, ur 30 (*)    Nitrite POSITIVE (*)    Leukocytes,Ua SMALL (*)    WBC, UA >50 (*)    Bacteria, UA MANY (*)    All other components within normal limits  RAPID URINE DRUG SCREEN, HOSP PERFORMED - Abnormal; Notable for the following components:   Opiates POSITIVE (*)    Cocaine POSITIVE (*)    Benzodiazepines POSITIVE (*)    All other components within normal limits  CBG MONITORING, ED - Abnormal; Notable for the following components:   Glucose-Capillary 144 (*)    All other components within normal limits  SARS CORONAVIRUS 2 BY RT PCR (HOSPITAL ORDER, [place] [facility])  URINE CULTURE  PROTIME-INR  LACTIC ACID,  PLASMA  AMMONIA  ETHANOL  BLOOD GAS, VENOUS  CBG MONITORING, ED    EKG EKG Interpretation  Date/Time:  Thursday May 18 2019 07:57:11 EDT Ventricular Rate:  99 PR Interval:    QRS Duration: 105 QT Interval:  406 QTC Calculation: 522 R Axis:   -58 Text Interpretation: Sinus rhythm Probable left atrial enlargement Left anterior fascicular block Abnormal R-wave progression, late transition Left ventricular hypertrophy Prolonged QT interval since last tracing no significant change Confirmed by Daleen Bo [phone]) on 05/18/2019 8:05:30 AM   Radiology Ct Head Wo Contrast  Result Date: 05/18/2019 CLINICAL DATA:  Acute confusion. EXAM: CT HEAD WITHOUT CONTRAST TECHNIQUE: Contiguous axial images were obtained from the base of the skull through the vertex without intravenous contrast. COMPARISON:  January 20, 2019. FINDINGS: Brain: Old bilateral MCA infarctions are again noted. No mass effect or midline shift is noted. Ventricular size is within normal limits. There is no evidence of mass lesion, hemorrhage or acute infarction. Vascular: No hyperdense vessel or unexpected calcification. Skull: Normal. Negative for fracture or focal lesion. Sinuses/Orbits: No acute finding. Other: None. IMPRESSION: No acute intracranial abnormality seen. Electronically Signed   By: Marijo Conception M.D.   On: 05/18/2019 10:20   Dg Chest Port 1 View  Result Date: 05/18/2019 CLINICAL DATA:  Weakness. History of prostate cancer. Smoker. EXAM: PORTABLE CHEST 1 VIEW COMPARISON:  01/13/2019  FINDINGS: Motion blurring. Enlarged cardiac silhouette with improvement. The endotracheal tube has been removed. Interval linear densities in the right mid lung zone and left mid and lower lung zones. Unremarkable bones. IMPRESSION: 1. Interval linear atelectasis in the right mid lung zone and left mid and lower lung zones. 2. Cardiomegaly with improvement. Electronically Signed   By: Claudie Revering M.D.   On: 05/18/2019 08:39     Procedures Procedures (including critical care time)  Medications Ordered in ED Medications  sodium chloride 0.9 % bolus 500 mL (0 mLs Intravenous Stopped 05/18/19 0912)  potassium chloride 10 mEq in 100 mL IVPB (0 mEq Intravenous Stopped 05/18/19 1124)  cefTRIAXone (ROCEPHIN) 1 g in sodium chloride 0.9 % 100 mL IVPB (0 g Intravenous Stopped 05/18/19 1205)  naloxone (NARCAN) injection 0.4 mg (0.4 mg Intravenous Given 05/18/19 1002)  erythromycin ophthalmic ointment (1 application Left Eye Given 05/18/19 1209)     Initial Impression / Assessment and Plan / ED Course  I have reviewed the triage vital signs and the nursing notes.  Pertinent labs & imaging results that were available during my care of the patient were reviewed by me and considered in my medical decision making (see chart for details).  Clinical Course as of May 17 1357  Thu May 18, 2019  1059 Patient is sleeping, arouses easily to verbal stimuli, noted to have frequent nonproductive cough.  Patient reports swelling with drainage in his left eye which he first noticed last night.  Patient does admit to taking 2 Xanax last night, denies cocaine use.  Patient satting 93% on room air while sleeping.   [LM]  1222 Patient is more awake at this time, able to hold a conversation on the phone with his son.  Patient continues to sat 93% on room air while awake.  Plan is to ambulate patient on monitoring O2.  If patient is able to ambulate safely he may be discharged with a prescription for doxycycline for urinary tract infection and bronchitis. Case discussed with Dr. Eulis Foster, ER attending who has seen the patient, agrees with plan of care.   [LM]  1356 Patient is sitting up on the side of the bed, he has eaten lunch, is awake, alert and ready to go home.  Discussed results with patient, recommend that he continue with his home medications.  Patient prescribed doxycycline and gentamicin, should follow-up with PCP tomorrow.  Discussed  substance abuse and episode that led to his ER visit today.   [LM]  1357 Review of lab work, CBC without changes from baseline, CMP with hypokalemia with potassium of 2.9, repleted with 10 mEq IV as patient was not tolerating p.o. on arrival due to somnolence.  Urinalysis positive for hemoglobin, nitrites, leukocytes, greater than 50 white cells and many bacteria, sent for culture and will be treated with doxycycline.  Covid negative, VBG without significant changes, alcohol negative.  Urine drug screen positive for opiates, cocaine, benzos.  Patient does state that he took 2 Xanax last night.  Ammonia level is within normal limits.  Chest x-ray with atelectasis, no infectious source on chest x-ray however due to cough and immunocompromise state, will treat with antibiotics along with his urinary tract infection.   [LM]    Clinical Course User Index [LM] Tacy Learn, PA-C      Final Clinical Impressions(s) / ED Diagnoses   Final diagnoses:  None    ED Discharge Orders    None       Suella Broad  A, PA-C 05/18/19 1358    Daleen Bo, MD 05/18/19 1801

## 2019-05-18 NOTE — ED Notes (Signed)
Pt unable to stand up without assistance. Unable to walk at this time. PA notified

## 2019-05-18 NOTE — ED Provider Notes (Signed)
  Face-to-face evaluation   History: He presents for evaluation of sleepiness.  He takes narcotics as well as nighttime Seroquel as a sleep aid.  Physical exam: Somnolent, obese, sonorous respirations with increased rate.  Rhonchi bilateral lungs.  No increased work of breathing.  Medical screening examination/treatment/procedure(s) were conducted as a shared visit with non-physician practitioner(s) and myself.  I personally evaluated the patient during the encounter    Daleen Bo, MD 05/18/19 1801

## 2019-05-18 NOTE — ED Notes (Signed)
Pt given a lunch tray. Tolerating well.

## 2019-05-18 NOTE — Discharge Instructions (Addendum)
Take doxycycline as prescribed and complete the full course.  Apply drops to your left eye as prescribed. Follow-up with your primary care doctor for recheck tomorrow, return to ER for new or worsening symptoms. Take your regular medications as prescribed.

## 2019-05-18 NOTE — ED Notes (Signed)
Patient transported to CT 

## 2019-05-19 ENCOUNTER — Inpatient Hospital Stay: Payer: 59

## 2019-05-19 ENCOUNTER — Other Ambulatory Visit: Payer: Self-pay

## 2019-05-19 ENCOUNTER — Inpatient Hospital Stay: Payer: 59 | Attending: Oncology | Admitting: Oncology

## 2019-05-19 VITALS — BP 147/96 | HR 100 | Temp 98.5°F | Resp 18 | Ht 72.0 in | Wt 222.5 lb

## 2019-05-19 DIAGNOSIS — C7951 Secondary malignant neoplasm of bone: Secondary | ICD-10-CM | POA: Diagnosis not present

## 2019-05-19 DIAGNOSIS — Z79899 Other long term (current) drug therapy: Secondary | ICD-10-CM | POA: Diagnosis not present

## 2019-05-19 DIAGNOSIS — Z923 Personal history of irradiation: Secondary | ICD-10-CM | POA: Insufficient documentation

## 2019-05-19 DIAGNOSIS — C61 Malignant neoplasm of prostate: Secondary | ICD-10-CM | POA: Insufficient documentation

## 2019-05-19 DIAGNOSIS — Z7984 Long term (current) use of oral hypoglycemic drugs: Secondary | ICD-10-CM | POA: Diagnosis not present

## 2019-05-19 DIAGNOSIS — Z7901 Long term (current) use of anticoagulants: Secondary | ICD-10-CM | POA: Insufficient documentation

## 2019-05-19 DIAGNOSIS — M545 Low back pain: Secondary | ICD-10-CM | POA: Diagnosis not present

## 2019-05-19 DIAGNOSIS — R400 Somnolence: Secondary | ICD-10-CM | POA: Insufficient documentation

## 2019-05-19 DIAGNOSIS — Z888 Allergy status to other drugs, medicaments and biological substances status: Secondary | ICD-10-CM | POA: Diagnosis not present

## 2019-05-19 LAB — CBC WITH DIFFERENTIAL (CANCER CENTER ONLY)
Abs Immature Granulocytes: 0.05 10*3/uL (ref 0.00–0.07)
Basophils Absolute: 0 10*3/uL (ref 0.0–0.1)
Basophils Relative: 0 %
Eosinophils Absolute: 0.3 10*3/uL (ref 0.0–0.5)
Eosinophils Relative: 4 %
HCT: 32.8 % — ABNORMAL LOW (ref 39.0–52.0)
Hemoglobin: 10.7 g/dL — ABNORMAL LOW (ref 13.0–17.0)
Immature Granulocytes: 1 %
Lymphocytes Relative: 18 %
Lymphs Abs: 1.5 10*3/uL (ref 0.7–4.0)
MCH: 25.7 pg — ABNORMAL LOW (ref 26.0–34.0)
MCHC: 32.6 g/dL (ref 30.0–36.0)
MCV: 78.8 fL — ABNORMAL LOW (ref 80.0–100.0)
Monocytes Absolute: 0.8 10*3/uL (ref 0.1–1.0)
Monocytes Relative: 9 %
Neutro Abs: 6 10*3/uL (ref 1.7–7.7)
Neutrophils Relative %: 68 %
Platelet Count: 346 10*3/uL (ref 150–400)
RBC: 4.16 MIL/uL — ABNORMAL LOW (ref 4.22–5.81)
RDW: 14.4 % (ref 11.5–15.5)
WBC Count: 8.7 10*3/uL (ref 4.0–10.5)
nRBC: 0 % (ref 0.0–0.2)

## 2019-05-19 LAB — CMP (CANCER CENTER ONLY)
ALT: 11 U/L (ref 0–44)
AST: 20 U/L (ref 15–41)
Albumin: 3.4 g/dL — ABNORMAL LOW (ref 3.5–5.0)
Alkaline Phosphatase: 133 U/L — ABNORMAL HIGH (ref 38–126)
Anion gap: 17 — ABNORMAL HIGH (ref 5–15)
BUN: 10 mg/dL (ref 8–23)
CO2: 21 mmol/L — ABNORMAL LOW (ref 22–32)
Calcium: 10 mg/dL (ref 8.9–10.3)
Chloride: 102 mmol/L (ref 98–111)
Creatinine: 1.45 mg/dL — ABNORMAL HIGH (ref 0.61–1.24)
GFR, Est AFR Am: 60 mL/min — ABNORMAL LOW (ref >60)
GFR, Estimated: 52 mL/min — ABNORMAL LOW (ref >60)
Glucose, Bld: 120 mg/dL — ABNORMAL HIGH (ref 70–99)
Potassium: 3.3 mmol/L — ABNORMAL LOW (ref 3.5–5.1)
Sodium: 140 mmol/L (ref 135–145)
Total Bilirubin: <0.2 mg/dL — ABNORMAL LOW (ref 0.3–1.2)
Total Protein: 8.9 g/dL — ABNORMAL HIGH (ref 6.5–8.1)

## 2019-05-19 NOTE — Progress Notes (Signed)
Hematology and Oncology Follow Up Visit  Jacob Joseph [id number] [birth year] 63 y.o. 05/19/2019 1:21 PM Alvina Chou, [place], MD   Principle Diagnosis: 63 year old man with castration-resistant prostate cancer with disease to the bone diagnosed in 2017.  His PSA was 23.1 at that time.   Prior Therapy: He was treated with Lupron under the care of [name] every 4 months since this time of diagnosis.  He is status post radiation therapy for a total of 30 Gy in 10 fractions to be completed in September 2019 to the thoracic spine.  Zytiga 1000 mg daily with prednisone at 5 mg daily started in August 2019.  Therapy discontinued in October 2020 for progression of disease.   Current therapy: Under evaluation for different salvage therapy.   Interim History: [name] is here for repeat evaluation.  Since last visit, he has started experiencing lower back discomfort although still ambulates without any major difficulties.  He was seen in the emergency department on 05/18/2019 for somnolence.  He did have a one fall and dislocated his toe.  His appetite and performance status remained about the same.  His Zytiga and prednisone has been discontinued for the last 3 weeks and was started on a different medication per his urologist although that information is not available to me at this time.  He denies any neurological deficits.  He denies any loss of bowel or bladder function.   Patient denied any alteration mental status, neuropathy, confusion or dizziness.  Denies any headaches or lethargy.  Denies any night sweats, weight loss or changes in appetite.  Denied orthopnea, dyspnea on exertion or chest discomfort.  Denies shortness of breath, difficulty breathing hemoptysis or cough.  Denies any abdominal distention, nausea, early satiety or dyspepsia.  Denies any hematuria, frequency, dysuria or nocturia.  Denies any skin irritation, dryness or rash.  Denies any ecchymosis or  petechiae.  Denies any lymphadenopathy or clotting.  Denies any heat or cold intolerance.  Denies any anxiety or depression.  Remaining review of system is negative.           Medications: Without any changes on review. Current Outpatient Medications  Medication Sig Dispense Refill  . apixaban (ELIQUIS) 2.5 MG TABS tablet Take 1 tablet (2.5 mg total) by mouth 2 (two) times daily. 180 tablet 3  . aspirin EC 81 MG tablet Take 81 mg by mouth daily.    [name] Kitchen azelastine (ASTELIN) 0.1 % nasal spray USE 1 SPRAY IN EACH NOSTRIL TWICE DAILY AS NEEDED FOR RHINITIS 90 mL 1  . bicalutamide (CASODEX) 50 MG tablet Take 50 mg by mouth daily.    . bictegravir-emtricitabine-tenofovir AF (BIKTARVY) 50-200-25 MG TABS tablet Take 1 tablet by mouth daily. 30 tablet 11  . CHANTIX 0.5 MG tablet Take 0.5 mg by mouth daily. Then take 1 tablet by mouth twice daily.    . cholecalciferol (VITAMIN D) 1000 units tablet Take 1 tablet (1,000 Units total) by mouth daily. (Patient taking differently: Take 2,000 Units by mouth daily. ) 90 tablet 3  . citalopram (CELEXA) 20 MG tablet Take 20 mg by mouth daily.    . diclofenac sodium (VOLTAREN) 1 % GEL APPLY 4 GRAMS EXTERNALLY TO THE AFFECTED AREA FOUR TIMES DAILY AS NEEDED FOR PAIN 100 g 1  . DILT-XR 120 MG 24 hr capsule TAKE 1 CAPSULE(120 MG) BY MOUTH DAILY 90 capsule 0  . doxycycline (VIBRAMYCIN) 100 MG capsule Take 1 capsule (100 mg total) by mouth 2 (two) times daily. 20 capsule  0  . FLOVENT HFA 220 MCG/ACT inhaler Inhale 1 puff into the lungs 2 (two) times daily.     [name] Kitchen gabapentin (NEURONTIN) 300 MG capsule Take 2 capsules (600 mg total) by mouth 2 (two) times daily. 120 capsule 2  . gentamicin (GARAMYCIN) 0.3 % ophthalmic solution Place 1 drop into the left eye every 4 (four) hours. 5 mL 0  . hydrochlorothiazide (HYDRODIURIL) 12.5 MG tablet Take 1 tablet (12.5 mg total) by mouth daily. 30 tablet 2  . HYDROcodone-acetaminophen (NORCO/VICODIN) 5-325 MG tablet Take 1 tablet  by mouth every 12 (twelve) hours as needed for moderate pain or severe pain. 40 tablet 0  . hydrOXYzine (VISTARIL) 50 MG capsule Take 50 mg by mouth at bedtime.     . metFORMIN (GLUCOPHAGE) 1000 MG tablet Take 1 tablet (1,000 mg total) by mouth 2 (two) times daily with a meal. (Patient taking differently: Take 500 mg by mouth 2 (two) times daily with a meal. ) 180 tablet 1  . pravastatin (PRAVACHOL) 40 MG tablet TAKE 1 TABLET(40 MG) BY MOUTH DAILY (Patient taking differently: Take 40 mg by mouth daily. ) 90 tablet 3  . QUEtiapine (SEROQUEL) 400 MG tablet Take 1 tablet (400 mg total) by mouth 2 (two) times daily. (Patient taking differently: Take 800 mg by mouth at bedtime. ) 60 tablet 0  . QUEtiapine (SEROQUEL) 50 MG tablet Take 50 mg by mouth 3 (three) times daily.    [name] Kitchen STIOLTO RESPIMAT 2.5-2.5 MCG/ACT AERS INL 2 PUFFS PO ITL D    . tamsulosin (FLOMAX) 0.4 MG CAPS capsule TAKE 1 CAPSULE(0.4 MG) BY MOUTH DAILY AFTER BREAKFAST (Patient taking differently: Take 0.4 mg by mouth daily after breakfast. ) 90 capsule 3  . valACYclovir (VALTREX) 500 MG tablet TAKE 1 TABLET BY MOUTH DAILY 90 tablet 2  . vitamin E 200 UNIT capsule Take 200 Units by mouth daily.     No current facility-administered medications for this visit.      Allergies:  Allergies  Allergen Reactions  . Lisinopril Swelling  . Statins Other (See Comments)    Elevated liver enzymes.    Past Medical History, Surgical history, Social history, and Family History unchanged on review.   Physical Exam:     ECOG: 1      General appearance: Alert, awake without any distress. Head: Atraumatic without abnormalities Oropharynx: Without any thrush or ulcers. Eyes: No scleral icterus. Lymph nodes: No lymphadenopathy noted in the cervical, supraclavicular, or axillary nodes Heart:regular rate and rhythm, without any murmurs or gallops.   Lung: Clear to auscultation without any rhonchi, wheezes or dullness to  percussion. Abdomin: Soft, nontender without any shifting dullness or ascites. Musculoskeletal: No clubbing or cyanosis. Neurological: No focal deficits noted in his lower extremities.  Able to move freely. Skin: No rashes or lesions.       Lab Results: Lab Results  Component Value Date   WBC 8.3 05/18/2019   HGB 10.0 (L) 05/18/2019   HCT 32.9 (L) 05/18/2019   MCV 81.4 05/18/2019   PLT 328 05/18/2019     Chemistry      Component Value Date/Time   NA 137 05/18/2019 0811   NA 140 01/27/2016 1527   K 2.9 (L) 05/18/2019 0811   CL 101 05/18/2019 0811   CO2 23 05/18/2019 0811   BUN 10 05/18/2019 0811   BUN 14 01/27/2016 1527   CREATININE 1.31 (H) 05/18/2019 0811   CREATININE 1.21 02/02/2019 1324   CREATININE 1.06 08/24/2018 0919  Component Value Date/Time   CALCIUM 9.2 05/18/2019 0811   ALKPHOS 113 05/18/2019 0811   AST 13 (L) 05/18/2019 0811   AST 12 (L) 02/02/2019 1324   ALT 11 05/18/2019 0811   ALT 11 02/02/2019 1324   BILITOT 0.2 (L) 05/18/2019 0811   BILITOT <0.2 (L) 02/02/2019 1324       Results for ARMOR, BAUM (MRN [id number]) as of 05/19/2019 13:40  Ref. Range 09/30/2018 13:43 02/02/2019 13:24  Prostate Specific Ag, Serum Latest Ref Range: 0.0 - 4.0 ng/mL 5.5 (H) 10.5 (H)     CLINICAL DATA:  CA PSA = 9.59 ON 11-22-2018 PT DENIES HX OF BONE SX, FX, OR TRAUMA CURRENT BONE PAIN IN LOWER LUMBER SPINE prostate carcinoma  EXAM: NUCLEAR MEDICINE WHOLE BODY BONE SCAN  TECHNIQUE: Whole body anterior and posterior images were obtained approximately 3 hours after intravenous injection of radiopharmaceutical.  RADIOPHARMACEUTICALS:  22.0 mCi Technetium-36m MDP IV  COMPARISON:  Bone scan March 01, 2018  FINDINGS: Two new lesions in the RIGHT iliac bone and increased intensity in lesion in the iliac crest. New lesions in the LEFT and RIGHT ischium.  Two lesions in the thoracic spine again noted. Increased intensity of these two  lesions.  New lesion in a RIGHT anterior mid rib. New lesion in a LEFT anterior mid rib.  New uptake in the RIGHT mandible region.  IMPRESSION: 1. Several new foci of prostate cancer metastasis in the pelvis and ribs. 2. Increase in intensity of lesions in the spine. 3. Findings most consistent with progression of prostate cancer skeletal metastasis.    Impression and Plan:  63 year old man with the:  1.    Advanced prostate cancer with disease to the bone diagnosed in 2017.  He has castration-resistant at this time.      His PSA in July 2020 showed rapid doubling with a bone scan obtained on 04/11/2019 showed increased uptake indicating progression of disease in the pelvis and ribs with increased intensity in the thoracic spine.  The natural course of this disease and different treatment options were discussed at this time.  It is unclear to me what is he taking for his prostate cancer I will obtain these records and clarifications in the immediate future.  Alternative options would include Darron Doom as well as systemic chemotherapy.  We will continue to follow his PSA and institute any of these treatments if he continues to have disease progression.   2.  Androgen deprivation: He continues to receive that under the care of [facility] which I recommended continuing that indefinitely.  3.  Lower back pain: Could be related to disease progression.  I will obtain MRI of the lumbar spine and will make appropriate referral for radiation oncology for possible repeat palliative radiation therapy.  He did receive thoracic spine radiation previously.  4.  Bone directed therapy: I recommended calcium and vitamin D supplements at this time.  Delton See is currently deferred for the time being.  5.  Prognosis and goals of care: Therapy remains palliative although aggressive measures are warranted at this time.  6.  Follow-up: He will return in 6 to 8 weeks for repeat  evaluation.   25 minutes was spent with the patient face-to-face today.  More than 50% of time was dedicated to reviewing his disease status, treatment options as well as future plan of care.      Zola Button, MD 10/30/20201:21 PM

## 2019-05-20 LAB — URINE CULTURE: Culture: >=100000 — AB

## 2019-05-20 LAB — PROSTATE-SPECIFIC AG, SERUM (LABCORP): Prostate Specific Ag, Serum: 33.2 ng/mL — ABNORMAL HIGH (ref 0.0–4.0)

## 2019-05-21 ENCOUNTER — Telehealth: Payer: Self-pay | Admitting: *Deleted

## 2019-05-21 NOTE — Telephone Encounter (Signed)
Post ED Visit - Positive Culture Follow-up  Culture report reviewed by antimicrobial stewardship pharmacist: [place] Team []  Elenor Quinones, Pharm.D. []  Heide Guile, Pharm.D., BCPS AQ-ID []  Parks Neptune, Pharm.D., BCPS []  Alycia Rossetti, Pharm.D., BCPS []  Charlo, Pharm.D., BCPS, AAHIVP []  Legrand Como, Pharm.D., BCPS, AAHIVP []  Salome Arnt, PharmD, BCPS []  Johnnette Gourd, PharmD, BCPS []  Hughes Better, PharmD, BCPS []  Leeroy Cha, PharmD []  Laqueta Linden, PharmD, BCPS []  Albertina Parr, PharmD  [place] Team []  Leodis Sias, PharmD []  Lindell Spar, PharmD []  Royetta Asal, PharmD []  Graylin Shiver, Rph []  Rema Fendt) Glennon Mac, PharmD []  Arlyn Dunning, PharmD []  Netta Cedars, PharmD []  Dia Sitter, PharmD []  Leone Haven, PharmD []  Gretta Arab, PharmD []  Theodis Shove, PharmD []  Peggyann Juba, PharmD []  Reuel Boom, PharmD   Positive urine culture, reviewed by Delia Heady, PA Treated with Doxycycline Hyclate, organism sensitive to the same and no further patient follow-up is required at this time.  Symptom check with patient feeling much better.  Harlon [facility] 05/21/2019, 10:35 AM

## 2019-05-21 NOTE — Progress Notes (Signed)
ED Antimicrobial Stewardship Positive Culture Follow Up   Jacob Joseph is an 63 y.o. male who presented to [facility] on 05/18/2019 with a chief complaint of  Chief Complaint  Patient presents with  . Weakness   Presented with AMS and sleepness- confusion if took more seroquel/Norco than prescribed. No urinary sx noted. UA + with many bacteria, WBC >50, smal leukocytes, and + nitrites. D/C on doxycycline for bronchitis.  Recent Results (from the past 720 hour(s))  Urine culture     Status: Abnormal   Collection Time: 05/18/19  8:18 AM   Specimen: Urine, Clean Catch  Result Value Ref Range Status   Specimen Description   Final    URINE, CLEAN CATCH Performed at [facility], [address] [place], [place] [address]    Special Requests   Final    NONE Performed at [facility], [address] [place], [place] [address]    Culture >=100,000 COLONIES/mL KLEBSIELLA PNEUMONIAE (A)  Final   Report Status 05/20/2019 FINAL  Final   Organism ID, Bacteria KLEBSIELLA PNEUMONIAE (A)  Final      Susceptibility   Klebsiella pneumoniae - MIC*    AMPICILLIN RESISTANT Resistant     CEFAZOLIN <=4 SENSITIVE Sensitive     CEFTRIAXONE <=1 SENSITIVE Sensitive     CIPROFLOXACIN <=0.25 SENSITIVE Sensitive     GENTAMICIN <=1 SENSITIVE Sensitive     IMIPENEM <=0.25 SENSITIVE Sensitive     NITROFURANTOIN 64 INTERMEDIATE Intermediate     TRIMETH/SULFA <=20 SENSITIVE Sensitive     AMPICILLIN/SULBACTAM 4 SENSITIVE Sensitive     PIP/TAZO <=4 SENSITIVE Sensitive     Extended ESBL NEGATIVE Sensitive     * >=100,000 COLONIES/mL KLEBSIELLA PNEUMONIAE  SARS Coronavirus 2 by RT PCR (hospital order, performed in [place] hospital lab) Nasopharyngeal Nasopharyngeal Swab     Status: None   Collection Time: 05/18/19  8:55 AM   Specimen: Nasopharyngeal Swab  Result Value Ref Range Status   SARS Coronavirus 2 NEGATIVE NEGATIVE Final    Comment: (NOTE) If result is NEGATIVE SARS-CoV-2 target nucleic acids  are NOT DETECTED. The SARS-CoV-2 RNA is generally detectable in upper and lower  respiratory specimens during the acute phase of infection. The lowest  concentration of SARS-CoV-2 viral copies this assay can detect is 250  copies / mL. A negative result does not preclude SARS-CoV-2 infection  and should not be used as the sole basis for treatment or other  patient management decisions.  A negative result may occur with  improper specimen collection / handling, submission of specimen other  than nasopharyngeal swab, presence of viral mutation(s) within the  areas targeted by this assay, and inadequate number of viral copies  (<250 copies / mL). A negative result must be combined with clinical  observations, patient history, and epidemiological information. If result is POSITIVE SARS-CoV-2 target nucleic acids are DETECTED. The SARS-CoV-2 RNA is generally detectable in upper and lower  respiratory specimens dur ing the acute phase of infection.  Positive  results are indicative of active infection with SARS-CoV-2.  Clinical  correlation with patient history and other diagnostic information is  necessary to determine patient infection status.  Positive results do  not rule out bacterial infection or co-infection with other viruses. If result is PRESUMPTIVE POSTIVE SARS-CoV-2 nucleic acids MAY BE PRESENT.   A presumptive positive result was obtained on the submitted specimen  and confirmed on repeat testing.  While 2019 novel coronavirus  (SARS-CoV-2) nucleic acids may be present in  the submitted sample  additional confirmatory testing may be necessary for epidemiological  and / or clinical management purposes  to differentiate between  SARS-CoV-2 and other Sarbecovirus currently known to infect humans.  If clinically indicated additional testing with an alternate test  methodology [id number]) is advised. The SARS-CoV-2 RNA is generally  detectable in upper and lower respiratory sp ecimens  during the acute  phase of infection. The expected result is Negative. Fact Sheet for Patients:  StrictlyIdeas.no Fact Sheet for Healthcare Providers: BankingDealers.co.za This test is not yet approved or cleared by the [place] FDA and has been authorized for detection and/or diagnosis of SARS-CoV-2 by FDA under an Emergency Use Authorization (EUA).  This EUA will remain in effect (meaning this test can be used) for the duration of the COVID-19 declaration under Section 564(b)(1) of the Act, 21 U.S.C. section 360bbb-3(b)(1), unless the authorization is terminated or revoked sooner. Performed at [facility], [address] [place], [place] [address]     [x]  Treated with doxycycline, organism resistant to prescribed antimicrobial []  Patient discharged originally without antimicrobial agent and treatment is now indicated  New antibiotic prescription: Given minimal urinary symptoms, will call and see if any recurring symptoms. If not, no adjustments are needed. If yes, can order cephalexin 500 mg every 6 hours for 5 days.  ED Provider: Delia Heady, PA-C   Antonietta Jewel, PharmD, [place] Clinical Pharmacist  05/21/2019, 10:03 AM Clinical Pharmacist Monday - Friday phone -  [phone] Saturday - Sunday phone - [phone]

## 2019-05-22 ENCOUNTER — Telehealth: Payer: Self-pay | Admitting: Oncology

## 2019-05-22 NOTE — Telephone Encounter (Signed)
Scheduled appt per 10/30 los.  A calendar will be mailed out.

## 2019-05-24 ENCOUNTER — Ambulatory Visit: Payer: 59

## 2019-05-26 ENCOUNTER — Ambulatory Visit (HOSPITAL_COMMUNITY): Payer: 59

## 2019-05-26 ENCOUNTER — Other Ambulatory Visit: Payer: Self-pay

## 2019-05-26 ENCOUNTER — Other Ambulatory Visit: Payer: Self-pay | Admitting: Internal Medicine

## 2019-05-26 NOTE — Telephone Encounter (Signed)
Refill Approved

## 2019-06-01 ENCOUNTER — Ambulatory Visit (INDEPENDENT_AMBULATORY_CARE_PROVIDER_SITE_OTHER): Payer: 59 | Admitting: Internal Medicine

## 2019-06-01 ENCOUNTER — Encounter: Payer: Self-pay | Admitting: Internal Medicine

## 2019-06-01 VITALS — BP 148/92 | HR 103 | Temp 98.9°F | Ht 72.0 in | Wt 224.3 lb

## 2019-06-01 DIAGNOSIS — Z87440 Personal history of urinary (tract) infections: Secondary | ICD-10-CM

## 2019-06-01 DIAGNOSIS — Z7901 Long term (current) use of anticoagulants: Secondary | ICD-10-CM

## 2019-06-01 DIAGNOSIS — I10 Essential (primary) hypertension: Secondary | ICD-10-CM

## 2019-06-01 DIAGNOSIS — Z79899 Other long term (current) drug therapy: Secondary | ICD-10-CM

## 2019-06-01 DIAGNOSIS — R296 Repeated falls: Secondary | ICD-10-CM

## 2019-06-01 DIAGNOSIS — N189 Chronic kidney disease, unspecified: Secondary | ICD-10-CM | POA: Diagnosis not present

## 2019-06-01 DIAGNOSIS — M545 Low back pain: Secondary | ICD-10-CM

## 2019-06-01 DIAGNOSIS — M5137 Other intervertebral disc degeneration, lumbosacral region: Secondary | ICD-10-CM

## 2019-06-01 DIAGNOSIS — Z8673 Personal history of transient ischemic attack (TIA), and cerebral infarction without residual deficits: Secondary | ICD-10-CM

## 2019-06-01 DIAGNOSIS — G8929 Other chronic pain: Secondary | ICD-10-CM

## 2019-06-01 DIAGNOSIS — I129 Hypertensive chronic kidney disease with stage 1 through stage 4 chronic kidney disease, or unspecified chronic kidney disease: Secondary | ICD-10-CM

## 2019-06-01 DIAGNOSIS — C61 Malignant neoplasm of prostate: Secondary | ICD-10-CM

## 2019-06-01 DIAGNOSIS — Z9181 History of falling: Secondary | ICD-10-CM

## 2019-06-01 DIAGNOSIS — Z79891 Long term (current) use of opiate analgesic: Secondary | ICD-10-CM

## 2019-06-01 NOTE — Assessment & Plan Note (Signed)
Patient has history of recurrent falls. PT stopped seeing him just prior to recent admission. He has continued to have falls and will benefit from continued PT with addition of vestibular PT as he describes episodes of room spining with position change that precede his falls consistent with BPPV. He denies hearing changes, pain, nor tinnitus. - Referral to PT and Vestibular PT

## 2019-06-01 NOTE — Assessment & Plan Note (Signed)
Patient's pain has been improved with BID PRN hydrocodone. He was in the ED with somnolence after taking Xanax and cocaine in addition to his Hydrocodone. He was warned not to take Xanax as it is not prescribed to him and is dangerous in combination with hydrocodone. He also tested positive for cocaine at that ED visit, he was instructed to continue to work on cessation of this as well as it makes it risky to prescribe controlled medications and is also dangerous in light of his prior stroke and other medical problems.  Will continue current dose for now and have patient sign pain contract and start UDS at follow up. - Hydrocodone-APAP 5-325mg  BID PRN - F/U in 2 months

## 2019-06-01 NOTE — Patient Instructions (Addendum)
Thank yo for allowing [place] to care for you  For your falls - Your symptoms of room spining with position change are consistent with vertigo - We will refer you to PT for both strength training and vestibular PT (to treat vertigo) - No not take other sedating medications or medication not prescribed to you while taking Hydrocodone  For your BP - Start HCTZ in addition to current medications, follow up in about 2 months for recheck and labs  Follow up in about 2 months

## 2019-06-01 NOTE — Assessment & Plan Note (Signed)
BP 148/92 today. He states he did not pick up HCTZ from the pharmacy. Will have him start this medication and recheck BP and labs on follow. Will monitor Renal Function given CKD and prostate cancer (increasing risk for obstruction). - Metoprolol 50mg  BID - Diltiazem 120mg  Daily - Start HCTZ 12.5mg  Daily - 2 month follow up for BMP and BP

## 2019-06-01 NOTE — Progress Notes (Signed)
Internal Medicine Clinic Attending  Case discussed with Dr. Trilby Drummer at the time of the visit.  We reviewed the resident's history and exam and pertinent patient test results.  I agree with the assessment, diagnosis, and plan of care documented in the resident's note.  To further clarify, use of cocaine and xanax not prescribed by our clinic would ordinarily prompt consideration of termination of prescribing his opioid pain medication. However, this is a man with metastatic cancer and limited life expectancy and continuing to manage his pain is key to compassionate care. Rest of plan per Dr. Trilby Drummer.  Lenice Pressman, M.D., Ph.D.

## 2019-06-01 NOTE — Assessment & Plan Note (Signed)
Patient has been adherent to regimen and continues to take Eliquis BID - Eliquis 2.5mg  BID

## 2019-06-01 NOTE — Progress Notes (Signed)
   CC: Hypertension, Falls, Anticoagulation, ED follow up  HPI:  Jacob Joseph is a 63 y.o. M with PMHx listed below presenting for Hypertension, Falls, Anticoagulation, ED follow up. Please see the A&P for the status of the patient's chronic medical problems.  Patient was seen in the ED on 05/18/2019 for somnolence. His UDS was positive for opioids (recently prescribed), Benzos (had taken xanax, not prescribed), and Cocaine (strugless with substance use). He was also noted to have bacteriuria and was treated for UTI and has completed his course of antibiotics.  Past Medical History:  Diagnosis Date  . Abnormal computed tomography of thoracic spine 02/26/2018  . AKI (acute kidney injury) ([place]) 09/24/2016  . BPH associated with nocturia 06/19/2016  . Calcium oxalate crystals in urine 12/23/2011   Asymptomatic, no hematuria. Advised to take plenty of water.  . Calculus of gallbladder without cholecystitis without obstruction   . COPD (chronic obstructive pulmonary disease) ([place])   . Depression   . Diabetes mellitus without complication ([place]) 123456   pre diabetic  . Hepatitis C   . HIV (human immunodeficiency virus infection) ([place])   . Hypertension   . PE (pulmonary thromboembolism) ([place]) 05/12/2016   PE in 2017, on chronic anticoagulation for this and recurrent DVT.  [name] Kitchen Peripheral arterial disease ([place])    ooccluded left SFA by duplex ultrasound, tibial disease left greater than right  . Prostate cancer metastatic to multiple sites [facility])   . Stroke ([place]) 11/2011   Carotids Doppler negative. Right sided weakness resolved, initially on Plavix for 3 months and then continued with ASA.    . TB lung, latent 1988   Treated   Review of Systems:  Performed and all others negative.  Physical Exam:  Vitals:   06/01/19 1411  BP: (!) 148/92  Pulse: (!) 103  Temp: 98.9 F (37.2 C)  TempSrc: Oral  SpO2: 100%  Weight: 224 lb 4.8 oz (101.7 kg)   Physical Exam Constitutional:      General:  He is not in acute distress.    Appearance: Normal appearance.  Cardiovascular:     Rate and Rhythm: Normal rate and regular rhythm.     Pulses: Normal pulses.     Heart sounds: Normal heart sounds.  Pulmonary:     Effort: Pulmonary effort is normal. No respiratory distress.     Breath sounds: Normal breath sounds.  Abdominal:     General: Bowel sounds are normal. There is no distension.     Palpations: Abdomen is soft.     Tenderness: There is no abdominal tenderness.  Musculoskeletal:        General: Tenderness present. No swelling or deformity.     Comments: Midline low back tenderness  Skin:    General: Skin is warm and dry.  Neurological:     General: No focal deficit present.     Mental Status: Mental status is at baseline.     Comments: Bilateral LE strength 5/5    Assessment & Plan:   See Encounters Tab for problem based charting.  Patient discussed with Dr. Rebeca Alert

## 2019-06-02 ENCOUNTER — Other Ambulatory Visit: Payer: Self-pay | Admitting: Podiatry

## 2019-06-05 ENCOUNTER — Telehealth: Payer: Self-pay | Admitting: Physical Therapy

## 2019-06-05 ENCOUNTER — Ambulatory Visit (HOSPITAL_COMMUNITY)
Admission: RE | Admit: 2019-06-05 | Discharge: 2019-06-05 | Disposition: A | Discharge status: Home / Self Care | Payer: 59 | Source: Ambulatory Visit | Attending: Oncology | Admitting: Oncology

## 2019-06-05 ENCOUNTER — Other Ambulatory Visit: Payer: Self-pay

## 2019-06-05 DIAGNOSIS — C61 Malignant neoplasm of prostate: Secondary | ICD-10-CM | POA: Insufficient documentation

## 2019-06-05 MED ORDER — GADOBUTROL 1 MMOL/ML IV SOLN
10.0000 mL | Freq: Once | INTRAVENOUS | Status: AC | PRN
  Administered 2019-06-05: 11:00:00 10 mL via INTRAVENOUS

## 2019-06-05 NOTE — Telephone Encounter (Signed)
Called patient per facility screening policy for covid risk factors to see if wishing to attend therapy in person versus pursue telehealth options. He reports that he wishes to attend in person so will further contact to schedule and set up evaluation.

## 2019-06-06 ENCOUNTER — Other Ambulatory Visit: Payer: Self-pay

## 2019-06-06 DIAGNOSIS — M5137 Other intervertebral disc degeneration, lumbosacral region: Secondary | ICD-10-CM

## 2019-06-06 MED ORDER — HYDROCODONE-ACETAMINOPHEN 5-325 MG PO TABS: 1.0000 | ORAL_TABLET | Freq: Two times a day (BID) | ORAL | 0 refills | Status: DC | PRN

## 2019-06-06 NOTE — Telephone Encounter (Signed)
Refill approved x1 will need to fill out pain contract at follow up

## 2019-06-06 NOTE — Telephone Encounter (Signed)
HYDROcodone-acetaminophen (NORCO/VICODIN) 5-325 MG tablet, REFILL REQUEST @  [id number] - [place], [place] - [address] AT [address] [place] [phone] (Phone) [phone] (Fax)

## 2019-06-07 ENCOUNTER — Other Ambulatory Visit: Payer: Self-pay | Admitting: *Deleted

## 2019-06-07 ENCOUNTER — Other Ambulatory Visit: Payer: Self-pay | Admitting: Internal Medicine

## 2019-06-07 DIAGNOSIS — M5137 Other intervertebral disc degeneration, lumbosacral region: Secondary | ICD-10-CM

## 2019-06-07 MED ORDER — HYDROCODONE-ACETAMINOPHEN 5-325 MG PO TABS: 1.0000 | ORAL_TABLET | Freq: Two times a day (BID) | ORAL | 0 refills | Status: DC | PRN

## 2019-06-07 NOTE — Telephone Encounter (Signed)
Pharmacy is [facility] calls and states pain med is to go to [facility] in [place], they have discontinued the script and please send new one to local [facility]

## 2019-06-07 NOTE — Telephone Encounter (Signed)
Thank you, refill approved

## 2019-06-20 ENCOUNTER — Encounter (HOSPITAL_COMMUNITY): Payer: Self-pay

## 2019-06-20 ENCOUNTER — Ambulatory Visit
Admission: RE | Admit: 2019-06-20 | Discharge: 2019-06-20 | Disposition: A | Discharge status: Home / Self Care | Payer: 59 | Source: Ambulatory Visit | Attending: Radiation Oncology | Admitting: Radiation Oncology

## 2019-06-20 ENCOUNTER — Ambulatory Visit (HOSPITAL_COMMUNITY): Payer: 59 | Attending: Internal Medicine

## 2019-06-20 ENCOUNTER — Other Ambulatory Visit: Payer: Self-pay

## 2019-06-20 ENCOUNTER — Encounter: Payer: Self-pay | Admitting: Urology

## 2019-06-20 ENCOUNTER — Ambulatory Visit: Admission: RE | Admit: 2019-06-20 | Payer: 59 | Source: Ambulatory Visit

## 2019-06-20 DIAGNOSIS — R262 Difficulty in walking, not elsewhere classified: Secondary | ICD-10-CM

## 2019-06-20 DIAGNOSIS — R2681 Unsteadiness on feet: Secondary | ICD-10-CM | POA: Insufficient documentation

## 2019-06-20 DIAGNOSIS — M6281 Muscle weakness (generalized): Secondary | ICD-10-CM | POA: Diagnosis present

## 2019-06-20 DIAGNOSIS — R2689 Other abnormalities of gait and mobility: Secondary | ICD-10-CM | POA: Diagnosis present

## 2019-06-20 DIAGNOSIS — C7951 Secondary malignant neoplasm of bone: Secondary | ICD-10-CM

## 2019-06-20 DIAGNOSIS — C61 Malignant neoplasm of prostate: Secondary | ICD-10-CM

## 2019-06-20 NOTE — Progress Notes (Signed)
Spoke with patient via phone. States he is in severe pain 10/10. Questions and concerns addressed.

## 2019-06-20 NOTE — Patient Instructions (Signed)
Coronavirus (COVID-19) Are you at risk?  Are you at risk for the Coronavirus (COVID-19)?  To be considered HIGH RISK for Coronavirus (COVID-19), you have to meet the following criteria:  . Traveled to [place], [place], [place], [place] or [place]; or in the [place] to [place], [place], [place], or [place]; and have fever, cough, and shortness of breath within the last 2 weeks of travel OR . Been in close contact with a person diagnosed with COVID-19 within the last 2 weeks and have fever, cough, and shortness of breath . IF YOU DO NOT MEET THESE CRITERIA, YOU ARE CONSIDERED LOW RISK FOR COVID-19.  What to do if you are HIGH RISK for COVID-19?  . If you are having a medical emergency, call 911. . Seek medical care right away. Before you go to a doctor's office, urgent care or emergency department, call ahead and tell them about your recent travel, contact with someone diagnosed with COVID-19, and your symptoms. You should receive instructions from your physician's office regarding next steps of care.  . When you arrive at healthcare provider, tell the healthcare staff immediately you have returned from visiting [place], [place], [place], [place] or [place]; or traveled in the [place] to [place], [place], [place], or [place]; in the last two weeks or you have been in close contact with a person diagnosed with COVID-19 in the last 2 weeks.   . Tell the health care staff about your symptoms: fever, cough and shortness of breath. . After you have been seen by a medical provider, you will be either: o Tested for (COVID-19) and discharged home on quarantine except to seek medical care if symptoms worsen, and asked to  - Stay home and avoid contact with others until you get your results (4-5 days)  - Avoid travel on public transportation if possible (such as bus, train, or airplane) or o Sent to the Emergency Department by EMS for evaluation, COVID-19 testing, and possible  admission depending on your condition and test results.  What to do if you are LOW RISK for COVID-19?  Reduce your risk of any infection by using the same precautions used for avoiding the common cold or flu:  . Wash your hands often with soap and warm water for at least 20 seconds.  If soap and water are not readily available, use an alcohol-based hand sanitizer with at least 60% alcohol.  . If coughing or sneezing, cover your mouth and nose by coughing or sneezing into the elbow areas of your shirt or coat, into a tissue or into your sleeve (not your hands). . Avoid shaking hands with others and consider head nods or verbal greetings only. . Avoid touching your eyes, nose, or mouth with unwashed hands.  . Avoid close contact with people who are sick. . Avoid places or events with large numbers of people in one location, like concerts or sporting events. . Carefully consider travel plans you have or are making. . If you are planning any travel outside or inside the US, visit the CDC's Travelers' Health webpage for the latest health notices. . If you have some symptoms but not all symptoms, continue to monitor at home and seek medical attention if your symptoms worsen. . If you are having a medical emergency, call 911.   ADDITIONAL HEALTHCARE OPTIONS FOR PATIENTS  [facility] / e-Visit: [email or web address]         [facility]: [phone]  [facility]: [phone]                   [facility]: [phone]   

## 2019-06-20 NOTE — Progress Notes (Signed)
Radiation Oncology         ([phone] ________________________________  Re-Consultation - Conducted via telephone due to current COVID-19 concerns for limiting patient exposure  Name: Jacob Joseph MRN: [id number]  Date of Service: 06/20/2019 DOB: 12/27/1955  [id number], Sheppard Coil, MD  Alexis Frock, MD   REFERRING PHYSICIAN: Alexis Frock, MD  DIAGNOSIS: 63 y.o. 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    ICD-10-CM   1. Spine metastasis ([place])  C79.51 Ambulatory referral to Social Work  2. Prostate cancer metastatic to multiple sites [facility])  [place] Ambulatory referral to Social Work    HISTORY OF PRESENT ILLNESS: Jacob Joseph is a 63 y.o. male seen at the request of Dr. Alen Blew and Dr. Tresa Moore painful bony mets to spine secondary to metastatic castration resistant prostate cancer. He was initially diagnosed with locally advanced, Gleason 4+5 prostate cancer on 08/25/2016 after presenting with an elevated PSA of 23.1.  His metastatic workup revealed right-sided pelvic lymphadenopathy on CT A/P and his bone scan was negative for any evidence of metastatic disease. He was started on ADT with Lupron by [name] on 09/24/2016, given every 4 months which he has continued but unfortunately his PSA continued to rise despite ADT.    He was hospitalized August 10-13th 2019 for right-sided flank pain, and workup revealed bony metastatic disease. CT abdomen/pelvis on 02/26/18 showed no acute abnormality in the abdomen or pelvis, but there was a 2.8 cm focal sclerotic lesion identified in the T10 vertebral body. MRI of the thoracic and lumbar spine were performed for further evaluation and showed abnormal bone lesions in the T2, T5, T9, T10, L1, L3, and L4 vertebral bodies. A bone scan on 03/01/2018 showed new areas of abnormal radiotracer localization, involving T11, left posterior 6th rib, and superior right iliac crest as compared to his prior bone scan  from 08/2016.   He was therefore referred to Dr. Alen Blew on 03/11/2018 and started on daily Zytiga with prednisone in addition to Lupron ADT. He was referred for consideration of palliative XRT to painful disease in the spine from T8 - T12 and this was completed 04/15/18. He tolerated treatment well and had resolution of the mid back and flank pain following radiation.  Unfortunately his PSA continued to rise despite his treatment and was up to 10.5 in 01/2019, up to 14.40 04/07/19 and most recently at 33.2 on 05/19/19. The Zytiga was discontinued in 04/2019 due to disease progression and he was started on bicalutamide (Casodex) in addition to Lupron ADT which he has continued taking as prescribed and tolerating well. He received a 6 month Eligard injection on 06/08/19.  09/2018 - 5.5 01/2019 - 10.5 03/2019 -  14.40 04/2019 - 33.2  More recently, he began to develop left sided lower back pain, just above the beltline, in the midline and out to the left which has been progressively worsening. He denies radiation of pain into the buttocks or LEs and denies paraesthesias or focal weakness in the LEs.  He underwent repeat bone scan on 04/10/2019, which showed several new foci of metastasis in the pelvis and ribs as well as increase in intensity of lesions in the spine. Repeat CT A/P 04/20/19 for disease restaging also showed progressive osseous metastasis as evidenced by a new lower thoracic sclerotic lesion and increased definition of a T10 lesion, as compared to 02/2018 but a decrease in right pelvic sidewall isolated adenopathy.  Due to his progressive low back pain, he proceeded to  lumbar spine MRI on 06/05/2019, which showed significant DDD in the lumbar spine as well as an increased size and number of bone metastases since 02/2018.  There was an enhancing lesion in the L1 vertebral body measuring 1.8 cm (previously 5 mm),  And a  2.1 cm lesion is partially visualized in the T11 vertebral body. New lesions include  a 1.6 cm lesion in the right L4 pedicle as well as bilateral upper sacral and posterior iliac lesions. A 2 cm lesion is present in the medial right upper sacral ala. No epidural tumor is identified. There is no fracture but does show progressive disc degeneration at L4-5 with severe spinal, left lateral recess, and left neural foraminal stenosis.  He has kindly been referred back to [place] for discussion of further palliative radiation treatment to the painful disease in the lumbar spine as well as consideration for Xofigo treatments.  Of note, the patient has COPD and regularly uses inhalers. He has HIV which is well-controlled under the care of Dr. Megan Salon.    PREVIOUS RADIATION THERAPY: Yes 04/04/18 - 04/15/18: Spine, T8-T12/ total of 30 Gy delivered in 10 fractions of 3 Gy  PAST MEDICAL HISTORY:  Past Medical History:  Diagnosis Date   Abnormal computed tomography of thoracic spine 02/26/2018   AKI (acute kidney injury) ([place]) 09/24/2016   BPH associated with nocturia 06/19/2016   Calcium oxalate crystals in urine 12/23/2011   Asymptomatic, no hematuria. Advised to take plenty of water.   Calculus of gallbladder without cholecystitis without obstruction    Closed fracture of proximal end of left humerus with delayed healing 05/26/2018   COPD (chronic obstructive pulmonary disease) ([facility])    Depression    Diabetes mellitus without complication ([place]) [id number]   pre diabetic   Hepatitis C    HIV (human immunodeficiency virus infection) ([place])    Hypertension    PE (pulmonary thromboembolism) ([place]) 05/12/2016   PE in 2017, on chronic anticoagulation for this and recurrent DVT.   Peripheral arterial disease ([place])    ooccluded left SFA by duplex ultrasound, tibial disease left greater than right   Pressure injury of skin 01/13/2019   Prostate cancer metastatic to multiple sites [facility])    Stroke ([place]) 11/2011   Carotids Doppler negative. Right sided weakness resolved, initially on Plavix  for 3 months and then continued with ASA.     TB lung, latent 1988   Treated      PAST SURGICAL HISTORY: Past Surgical History:  Procedure Laterality Date   HERNIA REPAIR     INGUINAL HERNIA REPAIR  01/16/2012   Procedure: HERNIA REPAIR INGUINAL INCARCERATED;  Surgeon: Zenovia Jarred, MD;  Location: [facility];  Service: General;  Laterality: Right;   TEE WITHOUT CARDIOVERSION  12/07/2011   Procedure: TRANSESOPHAGEAL ECHOCARDIOGRAM (TEE);  Surgeon: Birdie Riddle, MD;  Location[facility] ENDOSCOPY;  Service: Cardiovascular;  Laterality: N/A;    FAMILY HISTORY:  Family History  Problem Relation Age of Onset   Hypertension Mother    Heart attack Mother    Stroke Mother    Hypertension Father    Cancer Father     SOCIAL HISTORY:  Social History   Socioeconomic History   Marital status: Married    Spouse name: Not on file   Number of children: Not on file   Years of education: Not on file   Highest education level: Not on file  Occupational History   Occupation: disabled  Social Needs   Financial resource strain: Not  on file   Food insecurity    Worry: Not on file    Inability: Not on file   Transportation needs    Medical: Not on file    Non-medical: Not on file  Tobacco Use   Smoking status: Current Every Day Smoker    Packs/day: 0.45    Types: Cigarettes    Last attempt to quit: 11/18/2014    Years since quitting: 4.5   Smokeless tobacco: Never Used   Tobacco comment: .5 PPD  Substance and Sexual Activity   Alcohol use: No    Alcohol/week: 0.0 standard drinks   Drug use: Yes    Frequency: 3.0 times per week    Types: "Crack" cocaine, Cocaine    Comment: last times saturday   Sexual activity: Not on file    Comment: declined condoms  Lifestyle   Physical activity    Days per week: Not on file    Minutes per session: Not on file   Stress: Not on file  Relationships   Social connections    Talks on phone: Not on file    Gets together: Not  on file    Attends religious service: Not on file    Active member of club or organization: Not on file    Attends meetings of clubs or organizations: Not on file    Relationship status: Not on file   Intimate partner violence    Fear of current or ex partner: Not on file    Emotionally abused: Not on file    Physically abused: Not on file    Forced sexual activity: Not on file  Other Topics Concern   Not on file  Social History Narrative   Pt lives with wife and grandkid in [place].   Has applied for disability and is pending.   Worked in [place] before about 3 years ago.       ALLERGIES: Lisinopril and Statins  MEDICATIONS:  Current Outpatient Medications  Medication Sig Dispense Refill   apixaban (ELIQUIS) 2.5 MG TABS tablet Take 1 tablet (2.5 mg total) by mouth 2 (two) times daily. 180 tablet 3   aspirin EC 81 MG tablet Take 81 mg by mouth daily.     azelastine (ASTELIN) 0.1 % nasal spray USE 1 SPRAY IN EACH NOSTRIL TWICE DAILY AS NEEDED FOR RHINITIS 90 mL 1   bicalutamide (CASODEX) 50 MG tablet Take 50 mg by mouth daily.     bictegravir-emtricitabine-tenofovir AF (BIKTARVY) 50-200-25 MG TABS tablet Take 1 tablet by mouth daily. 30 tablet 11   CHANTIX 0.5 MG tablet Take 0.5 mg by mouth daily. Then take 1 tablet by mouth twice daily.     cholecalciferol (VITAMIN D) 1000 units tablet Take 1 tablet (1,000 Units total) by mouth daily. (Patient taking differently: Take 2,000 Units by mouth daily. ) 90 tablet 3   ciclopirox (LOPROX) 0.77 % cream APPLY TOPICALLY TO THE AFFECTED AREA TWICE DAILY 30 g 1   citalopram (CELEXA) 20 MG tablet Take 20 mg by mouth daily.     DILT-XR 120 MG 24 hr capsule TAKE 1 CAPSULE(120 MG) BY MOUTH DAILY 90 capsule 0   FLOVENT HFA 220 MCG/ACT inhaler USE 1 PUFF BY MOUTH TWICE DAILY 12 g 2   HYDROcodone-acetaminophen (NORCO/VICODIN) 5-325 MG tablet Take 1 tablet by mouth every 12 (twelve) hours as needed for moderate pain or severe pain. 40  tablet 0   hydrOXYzine (VISTARIL) 50 MG capsule Take 50 mg by mouth at bedtime.  metFORMIN (GLUCOPHAGE) 1000 MG tablet Take 1 tablet (1,000 mg total) by mouth 2 (two) times daily with a meal. (Patient taking differently: Take 500 mg by mouth 2 (two) times daily with a meal. ) 180 tablet 1   pravastatin (PRAVACHOL) 40 MG tablet TAKE 1 TABLET(40 MG) BY MOUTH DAILY 90 tablet 1   QUEtiapine (SEROQUEL) 400 MG tablet Take 1 tablet (400 mg total) by mouth 2 (two) times daily. (Patient taking differently: Take 800 mg by mouth at bedtime. ) 60 tablet 0   QUEtiapine (SEROQUEL) 50 MG tablet Take 50 mg by mouth 3 (three) times daily.     STIOLTO RESPIMAT 2.5-2.5 MCG/ACT AERS INL 2 PUFFS PO ITL D     tamsulosin (FLOMAX) 0.4 MG CAPS capsule TAKE 1 CAPSULE(0.4 MG) BY MOUTH DAILY AFTER BREAKFAST (Patient taking differently: Take 0.4 mg by mouth daily after breakfast. ) 90 capsule 3   valACYclovir (VALTREX) 500 MG tablet TAKE 1 TABLET BY MOUTH DAILY 90 tablet 2   vitamin E 200 UNIT capsule Take 200 Units by mouth daily.     gabapentin (NEURONTIN) 300 MG capsule Take 2 capsules (600 mg total) by mouth 2 (two) times daily. 120 capsule 2   No current facility-administered medications for this encounter.     REVIEW OF SYSTEMS:  On review of systems, the patient reports that he is doing well overall. He denies any chest pain, shortness of breath, cough, fevers, chills, night sweats, or unintended weight changes. He denies any bowel or bladder disturbances, and denies abdominal pain, nausea or vomiting. He reports left sided lower back pain, just above the beltline, in the midline and out to the left which has been progressively worsening and rates 10/10. He denies radiation of pain into the buttocks or LEs and denies paraesthesias or focal weakness in the LEs. He is currently taking Norco for this which does provide temporary relief.  A complete review of systems is obtained and is otherwise  negative.  PHYSICAL EXAM:  Wt Readings from Last 3 Encounters:  06/01/19 224 lb 4.8 oz (101.7 kg)  05/19/19 222 lb 8 oz (100.9 kg)  05/18/19 231 lb (104.8 kg)   Temp Readings from Last 3 Encounters:  06/01/19 98.9 F (37.2 C) (Oral)  05/19/19 98.5 F (36.9 C) (Oral)  05/18/19 99.8 F (37.7 C) (Rectal)   BP Readings from Last 3 Encounters:  06/01/19 (!) 148/92  05/19/19 (!) 147/96  05/18/19 (!) 175/104   Pulse Readings from Last 3 Encounters:  06/01/19 (!) 103  05/19/19 100  05/18/19 (!) 101   Pain Assessment Pain Score: 10-Worst pain ever Pain Frequency: Constant Pain Loc: Back/10  Unable to assess due to telephone re-consult visit format.  KPS = 70  100 - Normal; no complaints; no evidence of disease. 90   - Able to carry on normal activity; minor signs or symptoms of disease. 80   - Normal activity with effort; some signs or symptoms of disease. 31   - Cares for self; unable to carry on normal activity or to do active work. 60   - Requires occasional assistance, but is able to care for most of his personal needs. 50   - Requires considerable assistance and frequent medical care. 73   - Disabled; requires special care and assistance. 14   - Severely disabled; hospital admission is indicated although death not imminent. 59   - Very sick; hospital admission necessary; active supportive treatment necessary. 10   - Moribund; fatal processes progressing  rapidly. 0     - Dead  Karnofsky DA, Abelmann Winton, Craver LS and Burchenal [facility] [phone]) The use of the nitrogen mustards in the palliative treatment of carcinoma: with particular reference to bronchogenic carcinoma Cancer 1 634-56  LABORATORY DATA:  Lab Results  Component Value Date   WBC 8.7 05/19/2019   HGB 10.7 (L) 05/19/2019   HCT 32.8 (L) 05/19/2019   MCV 78.8 (L) 05/19/2019   PLT 346 05/19/2019   Lab Results  Component Value Date   NA 140 05/19/2019   K 3.3 (L) 05/19/2019   CL 102 05/19/2019   CO2 21 (L)  05/19/2019   Lab Results  Component Value Date   ALT 11 05/19/2019   AST 20 05/19/2019   ALKPHOS 133 (H) 05/19/2019   BILITOT <0.2 (L) 05/19/2019     RADIOGRAPHY: Mr Lumbar Spine W Wo Contrast  Result Date: 06/05/2019 CLINICAL DATA:  Follow-up prostate cancer. EXAM: MRI LUMBAR SPINE WITHOUT AND WITH CONTRAST TECHNIQUE: Multiplanar and multiecho pulse sequences of the lumbar spine were obtained without and with intravenous contrast. CONTRAST:  19mL GADAVIST GADOBUTROL 1 MMOL/ML IV SOLN COMPARISON:  Lumbar spine MRI 02/27/2018. CT abdomen and pelvis 04/20/2019. Nuclear medicine bone scan 04/11/2019. FINDINGS: Segmentation:  Standard. Alignment:  Normal. Vertebrae: Bone lesions have increased in size and number compared to the 2019 MRI. An enhancing lesion in the L1 vertebral body now measures 1.8 cm (previously 5 mm). A 2.1 cm lesion is partially visualized in the T11 vertebral body. New lesions include a 1.6 cm lesion in the right L4 pedicle as well as bilateral upper sacral and posterior iliac lesions. A 2 cm lesion is present in the medial right upper sacral ala. No epidural tumor is identified. There is no fracture. Chronic degenerative endplate changes are noted at L4-5 with a new Schmorl's node in the L5 superior endplate. Conus medullaris and cauda equina: Conus extends to the L1 level. Conus and cauda equina appear normal. Paraspinal and other soft tissues: Partial imaging of bilateral renal cysts. Disc levels: T12-L1 and L1-2: Negative. L2-3: Minimal disc bulging and minimal facet arthrosis without stenosis, unchanged. L3-4: Disc desiccation and mild disc space narrowing. Circumferential disc bulging, a superimposed shallow central disc protrusion with annular fissure, congenitally short pedicles, mildly prominent dorsal epidural fat, and mild facet and ligamentum flavum hypertrophy result in moderate spinal stenosis and moderate right and mild left neural foraminal stenosis, unchanged. L4-5:  Progressive disc desiccation and moderate disc space narrowing. Circumferential disc bulging, a left paracentral to left foraminal disc protrusion, congenitally short pedicles, prominent epidural fat, and mild facet and ligamentum flavum hypertrophy result in severe spinal stenosis, severe asymmetric left lateral recess stenosis, and moderate right and severe left neural foraminal stenosis, overall mildly progressed from prior. Potential left L4 and left L5 nerve root impingement. L5-S1: Mild disc bulging and facet hypertrophy result in mild bilateral neural foraminal stenosis without spinal stenosis, unchanged. IMPRESSION: 1. Increased size and number of bone metastases since 02/27/2018. No epidural tumor. 2. Progressive disc degeneration at L4-5 with severe spinal, left lateral recess, and left neural foraminal stenosis. 3. Unchanged moderate spinal stenosis and moderate right neural foraminal stenosis at L3-4. Electronically Signed   By: Logan Bores M.D.   On: 06/05/2019 11:27      IMPRESSION/PLAN: This visit was conducted via telephone to spare the patient unnecessary potential exposure in the healthcare setting during the current COVID-19 pandemic. 1. 63 y.o. gentleman with metastatic castrate resistant prostate cancer with painful metastases  to the lumbar spine.  Today, we talked to the patient about the findings and workup thus far. We discussed the natural history of metastatic prostate cancer and general treatment, highlighting the role of palliative radiation in the management of painful osseous metastatic disease. We also discussed the role of Xogifo infusions in the management of progressive, diffuse bony metastases. We reviewed the details and logistics of the delivery of palliative radiotherapy which he is familiar with, having previously received palliative XRT to the thoracic spine in 2019 with excellent response. The recommendation is to proceed with a 2 week course of daily palliative  radiotherapy to the site(s) of painful disease in the lumbar spine to assist with pain management.  We also recommend proceeding with  monthly Xofigo infusions x6 in the management of his diffuse osseous disease. We discussed the need to monitor labs prior to each infusion to ensure it is safe to proceed with each treatment. We reviewed the anticipated acute and late sequelae associated with palliative radiation and Xofigo in this setting. The patient was encouraged to ask questions that were answered to his satisfaction.  At the end of our conversation, the patient elects to proceed with palliative external beam radiation to the lumbar spine and move forward with coordinating Xofigo infusions to begin in the near future. We will share this information with Dr. Alen Blew and Dr. Tresa Moore and proceed with treatment planning accordingly. He is scheduled for CT simulation on Thursday, 06/22/2019 at 3:30 pm to plan his daily treatments to the lumbar spine which we anticipate beginning on Monday 06/26/19.  He does not drive and does not have family available to assist with his transportation for treatments so we will connect him with transportation services here at the [facility] to make the appropriate arrangements.  He will need transporation to his upcoming CT SIM/ treatment planning appointment and subsequent daily treatments as well as the monthly visits for labs and Xofigo infusions in the near future.   Given current concerns for patient exposure during the COVID-19 pandemic, this encounter was conducted via telephone. The patient was notified in advance and was offered a [place] meeting to allow for face to face communication but unfortunately reported that he did not have the appropriate resources/technology to support such a visit and instead preferred to proceed with telephone consult. The patient has given verbal consent for this type of encounter. The time spent during this encounter was 30 minutes. The  attendants for this meeting include Tyler Pita MD, Ashlyn Bruning PA-C, [place], patient, [place]. During the encounter, Tyler Pita MD, Ashlyn Bruning PA-C, and scribe, Wilburn Mylar were located at [name].  Patient, Bolden Mest was located at home.     Nicholos Johns, PA-C    Tyler Pita, MD  [facility] Direct Dial: [phone]   Fax: [phone] [email or web address]   Skype   [email or web address]   This document serves as a record of services personally performed by Tyler Pita, MD and Freeman Caldron, PA-C. It was created on their behalf by Wilburn Mylar, a trained medical scribe. The creation of this record is based on the scribe's personal observations and the provider's statements to them. This document has been checked and approved by the attending provider.

## 2019-06-20 NOTE — Therapy (Signed)
[place], [place], [address] Phone: [phone]   Fax:  [phone]  Physical Therapy Evaluation  Patient Details  Name: Jacob Joseph MRN: [id number] Date of Birth: [birth year] Referring Provider (PT): Velna Ochs, MD   Encounter Date: 06/20/2019  PT End of Session - 06/20/19 1252    Visit Number  1    Number of Visits  12    Date for PT Re-Evaluation  08/01/19    Authorization Type  [employer], Secondary: Medicaid    Authorization Time Period  06/20/19 to 07/31/18; complete progress note at visit 10    Authorization - Visit Number  1    Authorization - Number of Visits  10    PT Start Time  1300    PT Stop Time  1345    PT Time Calculation (min)  45 min    Equipment Utilized During Treatment  Gait belt    Activity Tolerance  Patient limited by fatigue;Patient limited by pain    Behavior During Therapy  [facility] for tasks assessed/performed       Past Medical History:  Diagnosis Date  . Abnormal computed tomography of thoracic spine 02/26/2018  . AKI (acute kidney injury) ([place]) 09/24/2016  . BPH associated with nocturia 06/19/2016  . Calcium oxalate crystals in urine 12/23/2011   Asymptomatic, no hematuria. Advised to take plenty of water.  . Calculus of gallbladder without cholecystitis without obstruction   . Closed fracture of proximal end of left humerus with delayed healing 05/26/2018  . COPD (chronic obstructive pulmonary disease) ([place])   . Depression   . Diabetes mellitus without complication ([place]) 123456   pre diabetic  . Hepatitis C   . HIV (human immunodeficiency virus infection) ([place])   . Hypertension   . PE (pulmonary thromboembolism) ([place]) 05/12/2016   PE in 2017, on chronic anticoagulation for this and recurrent DVT.  [name] Kitchen Peripheral arterial disease ([place])    ooccluded left SFA by duplex ultrasound, tibial disease left greater than right  . Pressure injury of skin 01/13/2019  . Prostate  cancer metastatic to multiple sites [facility])   . Stroke ([place]) 11/2011   Carotids Doppler negative. Right sided weakness resolved, initially on Plavix for 3 months and then continued with ASA.    . TB lung, latent 1988   Treated    Past Surgical History:  Procedure Laterality Date  . HERNIA REPAIR    . INGUINAL HERNIA REPAIR  01/16/2012   Procedure: HERNIA REPAIR INGUINAL INCARCERATED;  Surgeon: Zenovia Jarred, MD;  Location: [facility];  Service: General;  Laterality: Right;  . TEE WITHOUT CARDIOVERSION  12/07/2011   Procedure: TRANSESOPHAGEAL ECHOCARDIOGRAM (TEE);  Surgeon: Birdie Riddle, MD;  Location: [facility];  Service: Cardiovascular;  Laterality: N/A;    There were no vitals filed for this visit.   Subjective Assessment - 06/20/19 1303    Subjective  Pt reports constantly falling since July just trying to get around, wasn't using an AD, broke R shoulder when falling off back steps, sprained a toe, but no other major injuries. Pt reports falls occur randomly in all directions, but mainly in the house without AD. Pt reports using SPC or rollator walker consistently in community since October and doesn't use any AD in the home. Pt denies numbness/tingling throughout BLE. Pt reports falling once per day, typically can get up from the floor independently, but occasionally needs a family member to assist him off the floor. Pt reports  his spouse assists him when needed, but he can mostly do for himself using his [facility] or rollator. Pt reports LBP is getting worse, "feels like forever, but I have cancer and it is wrapping around my spine". Pt reports walking and static standing are equally difficult and cause him to fall. Pt denies a fear of falling, just says his legs give out. Pt reports 4 steps to enter the home with 2 handrails that he uses to assist himself; 1 fall off steps which caused him to break his shoulder. Pt reports going to rehab for strengthening after July hospitalization with good  results and previous PT for falls, but not with cancer metastases causing LBP.    Pertinent History  Prostate cancer metastatic to multiple sites (LBP), Cancer treatment begins sometime December 2020    Limitations  Standing;Walking    How long can you sit comfortably?  no issues    How long can you stand comfortably?  10 minutes    How long can you walk comfortably?  <10 minutes    Diagnostic tests  06/05/19 MRI: bone metastases increase since 02/27/18, L4-5 disc degeneration, R foraminal stenosis L3-4    Patient Stated Goals  strengthen my legs so that I can stand and walk and stuff    Currently in Pain?  Yes    Pain Score  9     Pain Location  Back    Pain Orientation  Lower    Pain Descriptors / Indicators  Constant;Aching    Pain Onset  More than a month ago    Pain Frequency  Constant    Aggravating Factors   constant pain secondary to cancer metastases    Pain Relieving Factors  pain medication temporarily    Effect of Pain on Daily Activities  limited         [facility] PT Assessment - 06/20/19 0001      Assessment   Medical Diagnosis  Recurrent falls    Referring Provider (PT)  Velna Ochs, MD    Onset Date/Surgical Date  --   approximately July 2020   Next MD Visit  January 2021    Prior Therapy  Yes, helped with balance deficits prior      Precautions   Precautions  Fall      Restrictions   Weight Bearing Restrictions  No      Balance Screen   Has the patient fallen in the past 6 months  Yes    How many times?  1 per day    Has the patient had a decrease in activity level because of a fear of falling?   No    Is the patient reluctant to leave their home because of a fear of falling?   No      Prior Function   Level of Independence  Requires assistive device for independence    Leisure  Watching movies, grandchildren      Cognition   Overall Cognitive Status  Within Functional Limits for tasks assessed      Observation/Other Assessments   Focus on  Therapeutic Outcomes (FOTO)   n/a      Sensation   Light Touch  Appears Intact      Functional Tests   Functional tests  Sit to Stand      Sit to Stand   Comments  30 sec STS: 3 reps, hands pushing from thighs, rocking momentum, increased LBP      ROM / Strength   AROM /  PROM / Strength  Strength      Strength   Strength Assessment Site  Hip;Knee;Ankle    Right Hip Flexion  3+/5    Right Hip Extension  3/5    Right Hip ABduction  3/5    Left Hip Flexion  3+/5    Left Hip Extension  3/5    Left Hip ABduction  3/5    Right Knee Flexion  3+/5    Right Knee Extension  4+/5    Left Knee Flexion  3+/5    Left Knee Extension  4+/5    Right Ankle Dorsiflexion  4+/5    Left Ankle Dorsiflexion  4+/5      Palpation   Palpation comment  Pt reports tenderness throughout lumbar spinal processes and mulsculature with palpation secondary to bone metastases      Ambulation/Gait   Ambulation Distance (Feet)  112 Feet    Assistive device  Rolling walker    Gait Pattern  Step-to pattern;Trunk flexed;Decreased step length - right;Decreased step length - left;Decreased stride length;Decreased hip/knee flexion - right;Decreased hip/knee flexion - left;Decreased dorsiflexion - right;Decreased dorsiflexion - left    Gait Comments  2MWT, required seated rest break after 1 min 20 sec due to SOB and LBP      Balance   Balance Assessed  Yes      Static Standing Balance   Static Standing - Balance Support  No upper extremity supported    Static Standing Balance -  Activities   Single Leg Stance - Right Leg;Single Leg Stance - Left Leg;Tandam Stance - Right Leg;Tandam Stance - Left Leg    Static Standing - Comment/# of Minutes  Tandem R back: 4.9 sec, Tandem L back: 3.5 sec, SLS <1 sec bilaterally        Objective measurements completed on examination: See above findings.        PT Education - 06/20/19 1312    Education Details  Assessment findings, POC, initiated HEP    Person(s) Educated   Patient    Methods  Explanation;Demonstration;Handout    Comprehension  Verbalized understanding;Returned demonstration       PT Short Term Goals - 06/20/19 1358      PT SHORT TERM GOAL #1   Title  Pt will perform HEP correctly and consistently, at least 2x/week to improve ambulation tolerance and reduce incidence of falls.    Time  3    Period  Weeks    Status  New    Target Date  07/11/19      PT SHORT TERM GOAL #2   Title  Pt will perform 30 sec STS at 5 reps with BUE assist and rollator to improve ability to rise from chair and move about house.    Time  3    Period  Weeks    Status  New      PT SHORT TERM GOAL #3   Title  Pt will complete 2MWT at 200 feet with LRAD, without seated rest break to demo improve cardiovascular endurance and muscle strength.    Time  3    Period  Weeks    Status  New        PT Long Term Goals - 06/20/19 1401      PT LONG TERM GOAL #1   Title  Pt will score 19 on DGI to indicate reduction in risk for falls with mobility around the home and in the community.    Time  6  Period  Weeks    Status  New    Target Date  08/01/19      PT LONG TERM GOAL #2   Title  Pt will perform 30 sec STS at 8 reps with BUE assist and rollator to improve ability to rise from chair and move about house.    Time  6    Period  Weeks    Status  New      PT LONG TERM GOAL #3   Title  Pt will complete 2MWT at 350 feet with LRAD, without seated rest break to demo improve cardiovascular endurance and muscle strength.    Time  6    Period  Weeks    Status  New      PT LONG TERM GOAL #4   Title  Pt will self report <10 falls since starting therapy to indicate improved strength and safety with mobility and improve overall quality of life.    Time  6    Period  Weeks    Status  New             Plan - 06/20/19 1352    Clinical Impression Statement  Pt is a pleasant 63YO male with recurrent falls and Prostate cancer metastatic to multiple sites,  including low back causing severe low back pain. Pt demonstrates deficits in BLE strength, balance, ambulation tolerance, gait pattern, activity tolerance and increased pain with mobility. Pt with previous positive results from PT interventions, but with progressing complex cancer metastases and will begin undergoing cancer treatment later this month. Pt demonstrates poor endurance requiring frequent seated therapeutic rest breaks and water breaks during initial evaluation and unable to complete 3MWT due to LBP. Pt would benefit from skilled PT interventions to improve deficits noted and establish HEP to continue progressing independently.    Personal Factors and Comorbidities  Comorbidity 3+    Comorbidities  Prostate cancer metastatic to multiple sites (LBP), stroke 2013, COPD,    Examination-Activity Limitations  Bed Mobility;Locomotion Level;Stairs;Stand;Transfers    Examination-Participation Restrictions  Cleaning    Stability/Clinical Decision Making  Evolving/Moderate complexity    Clinical Decision Making  Moderate    Rehab Potential  Fair    PT Frequency  2x / week    PT Duration  6 weeks    PT Treatment/Interventions  ADLs/Self Care Home Management;Aquatic Therapy;Cryotherapy;DME Instruction;Gait training;Stair training;Functional mobility training;Therapeutic activities;Therapeutic exercise;Balance training;Neuromuscular re-education;Patient/family education;Orthotic Fit/Training;Manual techniques;Passive range of motion;Dry needling;Energy conservation;Joint Manipulations    PT Next Visit Plan  Complete TUG and DGI. Review goals and HEP. Low level strengthening of BLE, avoid positions that increase LBP due to Prostate cancer metastases    PT Home Exercise Plan  Eval: seated heel raises, seated toe raises, STS    Consulted and Agree with Plan of Care  Patient       Patient will benefit from skilled therapeutic intervention in order to improve the following deficits and impairments:   Abnormal gait, Cardiopulmonary status limiting activity, Decreased activity tolerance, Decreased balance, Decreased endurance, Decreased mobility, Decreased strength, Difficulty walking, Pain  Visit Diagnosis: Difficulty in walking, not elsewhere classified  Muscle weakness (generalized)  Other abnormalities of gait and mobility  Unsteadiness on feet     Problem List Patient Active Problem List   Diagnosis Date Noted  . Xerosis of skin 02/26/2019  . History of angioedema 01/10/2019  . Leukocytosis 09/01/2018  . Severe sepsis ([place]) 08/31/2018  . Spine metastasis ([place]) 03/24/2018  . History of DVT (deep vein  thrombosis)   . Prostate cancer metastatic to multiple sites [facility])   . Radicular pain of thoracic region   . Bone lesion   . Chronic anticoagulation   . Right upper quadrant pain 02/26/2018  . Recurrent falls 11/02/2016  . History of prostate cancer 09/22/2016  . Substance abuse ([place]) 01/27/2016  . Vitamin D deficiency 12/05/2015  . Hyperlipidemia 01/28/2015  . Cataracts, bilateral 08/29/2014  . Diabetes mellitus treated with oral medication ([place]) 10/19/2013  . COPD, moderate ([place]) 05/31/2013  . Complex Lt Renal Cyst 03/22/2013  . Preventive measure 05/30/2012  . History of CVA 03/08/2012  . Normocytic anemia 03/08/2012  . CKD (chronic kidney disease) stage 3, GFR 30-59 ml/min ([facility]) 03/08/2012  . ERECTILE DYSFUNCTION 04/25/2008  . HIV disease ([place]) 04/27/2006  . Smoking greater than 30 pack years 04/27/2006  . Major depressive disorder, recurrent episode ([place]) 04/27/2006  . Hypertension 04/27/2006  . DEGENERATIVE JOINT DISEASE 04/27/2006  . Chronic Low Back Pain 04/27/2006    Talbot Grumbling PT, DPT 06/20/19, 2:12 PM [place] [address] [place], [place], [address] Phone: [phone]   Fax:  [phone]  Name: Dimetrius Lazzara MRN: [id number] Date of Birth: 29-Mar-1956

## 2019-06-21 ENCOUNTER — Other Ambulatory Visit (HOSPITAL_COMMUNITY): Payer: Self-pay | Admitting: Radiation Oncology

## 2019-06-21 ENCOUNTER — Telehealth: Payer: Self-pay | Admitting: *Deleted

## 2019-06-21 DIAGNOSIS — C7951 Secondary malignant neoplasm of bone: Secondary | ICD-10-CM

## 2019-06-21 DIAGNOSIS — C61 Malignant neoplasm of prostate: Secondary | ICD-10-CM

## 2019-06-21 NOTE — Telephone Encounter (Signed)
XXXXX

## 2019-06-21 NOTE — Telephone Encounter (Signed)
CALLED PATIENT TO INFORM OF LAB AND WEIGHT ON 07-11-19 @ 11:30 AM @ [facility]. ON 07-18-19 - ARRIVAL TIME- 12:45 PM @ [facility], SPOKE WITH PATIENT'S WIFE [name] AND SHE IS AWARE OF THESE APPTS.

## 2019-06-21 NOTE — Telephone Encounter (Signed)
CSW received referral from radiation oncology- reported transportation need for upcoming appointments.  CSW attempted contact to patient by phone to assess for needs and explore resources.  CSW left voicemail for patient to return call.  Maryjean Morn, MSW, LCSW, OSW-C Clinical Social Worker [facility] [phone]

## 2019-06-22 ENCOUNTER — Encounter: Payer: Self-pay | Admitting: *Deleted

## 2019-06-22 ENCOUNTER — Ambulatory Visit: Payer: 59 | Admitting: Radiation Oncology

## 2019-06-22 NOTE — Progress Notes (Signed)
[place] [facility]  Clinical Social Work was referred by radiation oncology for transportation needs. CSW is familiar with patient; patient has used [facility] transportation in the past and has no car or family members to provide transportation.  He is using Medicaid [place] transportation to his local ortho appointments but does not have resource to cancer center.  Patient is scheduled to utilize [facility] transportation program for today's [facility] appointment.  Patient plans to contact CSW as needs arise or transportation situation changes.    Gwinda Maine, LCSW  Clinical Social Worker [facility]

## 2019-06-27 ENCOUNTER — Encounter (HOSPITAL_COMMUNITY): Payer: Self-pay

## 2019-06-27 ENCOUNTER — Other Ambulatory Visit: Payer: Self-pay

## 2019-06-27 ENCOUNTER — Ambulatory Visit (HOSPITAL_COMMUNITY): Payer: 59

## 2019-06-27 DIAGNOSIS — R262 Difficulty in walking, not elsewhere classified: Secondary | ICD-10-CM | POA: Diagnosis not present

## 2019-06-27 DIAGNOSIS — M6281 Muscle weakness (generalized): Secondary | ICD-10-CM

## 2019-06-27 DIAGNOSIS — R2681 Unsteadiness on feet: Secondary | ICD-10-CM

## 2019-06-27 DIAGNOSIS — R2689 Other abnormalities of gait and mobility: Secondary | ICD-10-CM

## 2019-06-27 NOTE — Therapy (Signed)
[place], [place], [address] Phone: [phone]   Fax:  [phone]  Physical Therapy Treatment  Patient Details  Name: Jacob Joseph MRN: [id number] Date of Birth: 07/05/1956 Referring Provider (PT): Velna Ochs, MD   Encounter Date: 06/27/2019  PT End of Session - 06/27/19 1351    Visit Number  2    Number of Visits  12    Date for PT Re-Evaluation  08/01/19    Authorization Type  [employer], Secondary: Medicaid    Authorization Time Period  06/20/19 to 07/31/18; complete progress note at visit 10    Authorization - Visit Number  2    Authorization - Number of Visits  10    PT Start Time  [id number]    PT Stop Time  1427    PT Time Calculation (min)  40 min    Equipment Utilized During Treatment  Gait belt    Activity Tolerance  Patient limited by fatigue;Patient limited by pain    Behavior During Therapy  [name] for tasks assessed/performed       Past Medical History:  Diagnosis Date  . Abnormal computed tomography of thoracic spine 02/26/2018  . AKI (acute kidney injury) ([place]) 09/24/2016  . BPH associated with nocturia 06/19/2016  . Calcium oxalate crystals in urine 12/23/2011   Asymptomatic, no hematuria. Advised to take plenty of water.  . Calculus of gallbladder without cholecystitis without obstruction   . Closed fracture of proximal end of left humerus with delayed healing 05/26/2018  . COPD (chronic obstructive pulmonary disease) ([place])   . Depression   . Diabetes mellitus without complication ([place]) 123456   pre diabetic  . Hepatitis C   . HIV (human immunodeficiency virus infection) ([place])   . Hypertension   . PE (pulmonary thromboembolism) ([place]) 05/12/2016   PE in 2017, on chronic anticoagulation for this and recurrent DVT.  [name] Kitchen Peripheral arterial disease ([place])    ooccluded left SFA by duplex ultrasound, tibial disease left greater than right  . Pressure injury of skin 01/13/2019  . Prostate cancer  metastatic to multiple sites [facility])   . Stroke ([place]) 11/2011   Carotids Doppler negative. Right sided weakness resolved, initially on Plavix for 3 months and then continued with ASA.    . TB lung, latent 1988   Treated    Past Surgical History:  Procedure Laterality Date  . HERNIA REPAIR    . INGUINAL HERNIA REPAIR  01/16/2012   Procedure: HERNIA REPAIR INGUINAL INCARCERATED;  Surgeon: Zenovia Jarred, MD;  Location: [place];  Service: General;  Laterality: Right;  . TEE WITHOUT CARDIOVERSION  12/07/2011   Procedure: TRANSESOPHAGEAL ECHOCARDIOGRAM (TEE);  Surgeon: Birdie Riddle, MD;  Location: [place];  Service: Cardiovascular;  Laterality: N/A;    There were no vitals filed for this visit.  Subjective Assessment - 06/27/19 1350    Subjective  Pt reports he starts chemotherapy next week, he gets screened Thursday to develop a plan. Pt reports exercises are going well, but hasn't been able to do the sit to stands. Pt reports lost balance coming down steps at home, but caught himself before he fell.    Pertinent History  Prostate cancer metastatic to multiple sites (LBP), Cancer treatment begins sometime December 2020    Limitations  Standing;Walking    How long can you sit comfortably?  no issues    How long can you stand comfortably?  10 minutes    How long  can you walk comfortably?  <10 minutes    Diagnostic tests  06/05/19 MRI: bone metastases increase since 02/27/18, L4-5 disc degeneration, R foraminal stenosis L3-4    Patient Stated Goals  strengthen my legs so that I can stand and walk and stuff    Currently in Pain?  Yes    Pain Score  10-Worst pain ever    Pain Location  Back    Pain Orientation  Lower    Pain Descriptors / Indicators  Aching;Constant    Pain Type  Chronic pain    Pain Onset  More than a month ago    Pain Frequency  Constant    Aggravating Factors   constant pain secondary to cancer metastases    Pain Relieving Factors  pain medication temporarily     Effect of Pain on Daily Activities  limited         [facility] PT Assessment - 06/27/19 0001      Standardized Balance Assessment   Standardized Balance Assessment  Timed Up and Go Test;Dynamic Gait Index      Dynamic Gait Index   Level Surface  Mild Impairment    Change in Gait Speed  Moderate Impairment    Gait with Horizontal Head Turns  Mild Impairment    Gait with Vertical Head Turns  Mild Impairment    Gait and Pivot Turn  Mild Impairment    Step Over Obstacle  Moderate Impairment    Step Around Obstacles  Moderate Impairment    Steps  Moderate Impairment    Total Score  12    DGI comment:  no AD, 2 seated rest breaks to complete due to fatigue      Timed Up and Go Test   TUG  Normal TUG    Normal TUG (seconds)  24    TUG Comments  without AD, SUPV           [facility] Adult PT Treatment/Exercise - 06/27/19 0001      Exercises   Exercises  Knee/Hip      Knee/Hip Exercises: Seated   Long Arc Quad  Both;10 reps    Long Arc Quad Weight  2 lbs.    Other Seated Knee/Hip Exercises  heel raises, 2x15 reps; toe raises, 2x15 reps    Marching  Both;10 reps    Marching Weights  2 lbs.    Abduction/Adduction   10 reps    Abd/Adduction Limitations  GTB    Sit to Sand  10 reps   separated into 3 reps, 3 reps, 4 reps            PT Education - 06/27/19 1351    Education Details  Review goals, exercise technique, updated HEP    Person(s) Educated  Patient    Methods  Explanation;Demonstration;Handout    Comprehension  Verbalized understanding;Returned demonstration       PT Short Term Goals - 06/27/19 1352      PT SHORT TERM GOAL #1   Title  Pt will perform HEP correctly and consistently, at least 2x/week to improve ambulation tolerance and reduce incidence of falls.    Time  3    Period  Weeks    Status  On-going    Target Date  07/11/19      PT SHORT TERM GOAL #2   Title  Pt will perform 30 sec STS at 5 reps with BUE assist and rollator to improve ability to  rise from chair and move about house.  Time  3    Period  Weeks    Status  On-going      PT SHORT TERM GOAL #3   Title  Pt will complete 2MWT at 200 feet with LRAD, without seated rest break to demo improve cardiovascular endurance and muscle strength.    Time  3    Period  Weeks    Status  On-going        PT Long Term Goals - 06/27/19 1352      PT LONG TERM GOAL #1   Title  Pt will score 19 on DGI to indicate reduction in risk for falls with mobility around the home and in the community.    Time  6    Period  Weeks    Status  On-going      PT LONG TERM GOAL #2   Title  Pt will perform 30 sec STS at 8 reps with BUE assist and rollator to improve ability to rise from chair and move about house.    Time  6    Period  Weeks    Status  On-going      PT LONG TERM GOAL #3   Title  Pt will complete 2MWT at 350 feet with LRAD, without seated rest break to demo improve cardiovascular endurance and muscle strength.    Time  6    Period  Weeks    Status  On-going      PT LONG TERM GOAL #4   Title  Pt will self report <10 falls since starting therapy to indicate improved strength and safety with mobility and improve overall quality of life.    Time  6    Period  Weeks    Status  On-going            Plan - 06/27/19 1352    Clinical Impression Statement  Began treatment with reviewing goals and HEP. Pt scored 12 on DGI and completed normal TUG in 24 seconds, both without AD, but requires seated rest breaks and demonstrates moderate SOB. Educated pt on seated strengthening with intermittent verbal cues for form. Pt requires slow progression due to poor endurance and high pain at rest. Pt required 2 rest breaks with STS reps, able to complete 3, 3, then 4 reps consecutively. Pt reports no increased pain at EOS, but endorses fatigue. Continue to progress as able.    Personal Factors and Comorbidities  Comorbidity 3+    Comorbidities  Prostate cancer metastatic to multiple sites  (LBP), stroke 2013, COPD,    Examination-Activity Limitations  Bed Mobility;Locomotion Level;Stairs;Stand;Transfers    Examination-Participation Restrictions  Cleaning    Stability/Clinical Decision Making  Evolving/Moderate complexity    Rehab Potential  Fair    PT Frequency  2x / week    PT Duration  6 weeks    PT Treatment/Interventions  ADLs/Self Care Home Management;Aquatic Therapy;Cryotherapy;DME Instruction;Gait training;Stair training;Functional mobility training;Therapeutic activities;Therapeutic exercise;Balance training;Neuromuscular re-education;Patient/family education;Orthotic Fit/Training;Manual techniques;Passive range of motion;Dry needling;Energy conservation;Joint Manipulations    PT Next Visit Plan  Add balance exercises with rest breaks as needed. Low level strengthening of BLE, avoid positions that increase LBP due to prostate cancer metastases. Progress to standing strengthening as able.    PT Home Exercise Plan  Eval: seated heel raises, seated toe raises, STS; 12/8: seated abduction with GTB    Consulted and Agree with Plan of Care  Patient       Patient will benefit from skilled therapeutic intervention in order to  improve the following deficits and impairments:  Abnormal gait, Cardiopulmonary status limiting activity, Decreased activity tolerance, Decreased balance, Decreased endurance, Decreased mobility, Decreased strength, Difficulty walking, Pain  Visit Diagnosis: Difficulty in walking, not elsewhere classified  Muscle weakness (generalized)  Other abnormalities of gait and mobility  Unsteadiness on feet     Problem List Patient Active Problem List   Diagnosis Date Noted  . Xerosis of skin 02/26/2019  . History of angioedema 01/10/2019  . Leukocytosis 09/01/2018  . Severe sepsis ([place]) 08/31/2018  . Spine metastasis ([place]) 03/24/2018  . History of DVT (deep vein thrombosis)   . Prostate cancer metastatic to multiple sites [facility])   . Radicular pain of  thoracic region   . Bone lesion   . Chronic anticoagulation   . Right upper quadrant pain 02/26/2018  . Recurrent falls 11/02/2016  . History of prostate cancer 09/22/2016  . Substance abuse ([place]) 01/27/2016  . Vitamin D deficiency 12/05/2015  . Hyperlipidemia 01/28/2015  . Cataracts, bilateral 08/29/2014  . Diabetes mellitus treated with oral medication ([place]) 10/19/2013  . COPD, moderate ([place]) 05/31/2013  . Complex Lt Renal Cyst 03/22/2013  . Preventive measure 05/30/2012  . History of CVA 03/08/2012  . Normocytic anemia 03/08/2012  . CKD (chronic kidney disease) stage 3, GFR 30-59 ml/min ([facility]) 03/08/2012  . ERECTILE DYSFUNCTION 04/25/2008  . HIV disease ([place]) 04/27/2006  . Smoking greater than 30 pack years 04/27/2006  . Major depressive disorder, recurrent episode ([place]) 04/27/2006  . Hypertension 04/27/2006  . DEGENERATIVE JOINT DISEASE 04/27/2006  . Chronic Low Back Pain 04/27/2006     Talbot Grumbling PT, DPT 06/27/19, 2:36 PM [facility] [address] [place], [place], [address] Phone: [phone]   Fax:  [phone]  Name: Jacob Joseph MRN: [id number] Date of Birth: 01-07-1956

## 2019-06-28 ENCOUNTER — Ambulatory Visit (HOSPITAL_COMMUNITY): Payer: 59

## 2019-06-28 ENCOUNTER — Telehealth: Payer: Self-pay | Admitting: *Deleted

## 2019-06-28 ENCOUNTER — Encounter (HOSPITAL_COMMUNITY): Payer: Self-pay

## 2019-06-28 DIAGNOSIS — R2689 Other abnormalities of gait and mobility: Secondary | ICD-10-CM

## 2019-06-28 DIAGNOSIS — R262 Difficulty in walking, not elsewhere classified: Secondary | ICD-10-CM

## 2019-06-28 DIAGNOSIS — R2681 Unsteadiness on feet: Secondary | ICD-10-CM

## 2019-06-28 DIAGNOSIS — M6281 Muscle weakness (generalized): Secondary | ICD-10-CM

## 2019-06-28 NOTE — Therapy (Signed)
[place], [place], [address] Phone: [phone]   Fax:  [phone]  Physical Therapy Treatment  Patient Details  Name: Jacob Joseph MRN: [id number] Date of Birth: [birth year] Referring Provider (PT): Velna Ochs, MD   Encounter Date: 06/28/2019  PT End of Session - 06/28/19 1010    Visit Number  3    Number of Visits  12    Date for PT Re-Evaluation  08/01/19    Authorization Type  [employer], Secondary: Medicaid    Authorization Time Period  06/20/19 to 07/31/18; complete progress note at visit 10    Authorization - Visit Number  3    Authorization - Number of Visits  10    PT Start Time  1003    PT Stop Time  1042    PT Time Calculation (min)  39 min    Equipment Utilized During Treatment  Gait belt    Activity Tolerance  Patient limited by fatigue;Patient limited by pain   No reports of increased LBP through session   Behavior During Therapy  [facility] for tasks assessed/performed       Past Medical History:  Diagnosis Date  . Abnormal computed tomography of thoracic spine 02/26/2018  . AKI (acute kidney injury) ([place]) 09/24/2016  . BPH associated with nocturia 06/19/2016  . Calcium oxalate crystals in urine 12/23/2011   Asymptomatic, no hematuria. Advised to take plenty of water.  . Calculus of gallbladder without cholecystitis without obstruction   . Closed fracture of proximal end of left humerus with delayed healing 05/26/2018  . COPD (chronic obstructive pulmonary disease) ([place])   . Depression   . Diabetes mellitus without complication ([place]) 123456   pre diabetic  . Hepatitis C   . HIV (human immunodeficiency virus infection) ([place])   . Hypertension   . PE (pulmonary thromboembolism) ([place]) 05/12/2016   PE in 2017, on chronic anticoagulation for this and recurrent DVT.  [name] Kitchen Peripheral arterial disease ([place])    ooccluded left SFA by duplex ultrasound, tibial disease left greater than right  .  Pressure injury of skin 01/13/2019  . Prostate cancer metastatic to multiple sites [facility])   . Stroke ([place]) 11/2011   Carotids Doppler negative. Right sided weakness resolved, initially on Plavix for 3 months and then continued with ASA.    . TB lung, latent 1988   Treated    Past Surgical History:  Procedure Laterality Date  . HERNIA REPAIR    . INGUINAL HERNIA REPAIR  01/16/2012   Procedure: HERNIA REPAIR INGUINAL INCARCERATED;  Surgeon: Zenovia Jarred, MD;  Location: [place];  Service: General;  Laterality: Right;  . TEE WITHOUT CARDIOVERSION  12/07/2011   Procedure: TRANSESOPHAGEAL ECHOCARDIOGRAM (TEE);  Surgeon: Birdie Riddle, MD;  Location: [place];  Service: Cardiovascular;  Laterality: N/A;    There were no vitals filed for this visit.  Subjective Assessment - 06/28/19 1003    Subjective  Pt reports increased LBP yesterday, current pain scale 7/10.  Has a radiology apt scheduled for tomorrow.    Pertinent History  Prostate cancer metastatic to multiple sites (LBP), Cancer treatment begins sometime December 2020    Diagnostic tests  06/05/19 MRI: bone metastases increase since 02/27/18, L4-5 disc degeneration, R foraminal stenosis L3-4    Patient Stated Goals  strengthen my legs so that I can stand and walk and stuff    Currently in Pain?  Yes    Pain Score  7  Pain Location  Back    Pain Orientation  Lower    Pain Descriptors / Indicators  Aching;Sore    Pain Type  Chronic pain    Pain Onset  More than a month ago    Pain Frequency  Constant    Aggravating Factors   Constant pain secondary to cancer metastases    Pain Relieving Factors  pain medication temporarily    Effect of Pain on Daily Activities  limited                       [facility] Adult PT Treatment/Exercise - 06/28/19 0001      Exercises   Exercises  Knee/Hip      Knee/Hip Exercises: Standing   Other Standing Knee Exercises  Sidestep 2RT GTB inside //bars (cueing to reduce ER)    Other  Standing Knee Exercises  Tandem stance 2x 20"  (reports increased LBP so DC'd exercise for this session)      Knee/Hip Exercises: Seated   Long Arc Quad  Both;10 reps    Long Arc Quad Weight  3 lbs.    Other Seated Knee/Hip Exercises  heel raises, 2x15 reps; toe raises, 2x15 reps    Marching  Both;10 reps    Abduction/Adduction   10 reps    Abd/Adduction Limitations  GTB    Sit to Sand  10 reps;3 sets   3sets, 3x, 3x, 4x              PT Short Term Goals - 06/27/19 1352      PT SHORT TERM GOAL #1   Title  Pt will perform HEP correctly and consistently, at least 2x/week to improve ambulation tolerance and reduce incidence of falls.    Time  3    Period  Weeks    Status  On-going    Target Date  07/11/19      PT SHORT TERM GOAL #2   Title  Pt will perform 30 sec STS at 5 reps with BUE assist and rollator to improve ability to rise from chair and move about house.    Time  3    Period  Weeks    Status  On-going      PT SHORT TERM GOAL #3   Title  Pt will complete 2MWT at 200 feet with LRAD, without seated rest break to demo improve cardiovascular endurance and muscle strength.    Time  3    Period  Weeks    Status  On-going        PT Long Term Goals - 06/27/19 1352      PT LONG TERM GOAL #1   Title  Pt will score 19 on DGI to indicate reduction in risk for falls with mobility around the home and in the community.    Time  6    Period  Weeks    Status  On-going      PT LONG TERM GOAL #2   Title  Pt will perform 30 sec STS at 8 reps with BUE assist and rollator to improve ability to rise from chair and move about house.    Time  6    Period  Weeks    Status  On-going      PT LONG TERM GOAL #3   Title  Pt will complete 2MWT at 350 feet with LRAD, without seated rest break to demo improve cardiovascular endurance and muscle strength.    Time  6    Period  Weeks    Status  On-going      PT LONG TERM GOAL #4   Title  Pt will self report <10 falls since  starting therapy to indicate improved strength and safety with mobility and improve overall quality of life.    Time  6    Period  Weeks    Status  On-going            Plan - 06/28/19 1049    Clinical Impression Statement  Session focus on LE strengthening and began balance training.  Pt continues to fatigue quickly requiring seated rest breaks through session.  Added balance training including tandem stance and sidestepping, intermittent HHA required during tandem stance, cueing for posture for pain control and assistance with balance activities during standing exercises and to reduce ER during sidestepping.  No reports of increased LBP through session, pain scale continued at 7/10.    Personal Factors and Comorbidities  Comorbidity 3+    Comorbidities  Prostate cancer metastatic to multiple sites (LBP), stroke 2013, COPD,    Examination-Activity Limitations  Bed Mobility;Locomotion Level;Stairs;Stand;Transfers    Examination-Participation Restrictions  Cleaning    Stability/Clinical Decision Making  Evolving/Moderate complexity    Clinical Decision Making  Moderate    Rehab Potential  Fair    PT Frequency  2x / week    PT Duration  6 weeks    PT Treatment/Interventions  ADLs/Self Care Home Management;Aquatic Therapy;Cryotherapy;DME Instruction;Gait training;Stair training;Functional mobility training;Therapeutic activities;Therapeutic exercise;Balance training;Neuromuscular re-education;Patient/family education;Orthotic Fit/Training;Manual techniques;Passive range of motion;Dry needling;Energy conservation;Joint Manipulations    PT Next Visit Plan  Progress exercises and balance to standing as able. Low level strengthening of BLE, avoid positions that increase LBP due to prostate cancer metastases. Progress to standing strengthening as able.    PT Home Exercise Plan  Eval: seated heel raises, seated toe raises, STS; 12/8: seated abduction with GTB       Patient will benefit from  skilled therapeutic intervention in order to improve the following deficits and impairments:  Abnormal gait, Cardiopulmonary status limiting activity, Decreased activity tolerance, Decreased balance, Decreased endurance, Decreased mobility, Decreased strength, Difficulty walking, Pain  Visit Diagnosis: Difficulty in walking, not elsewhere classified  Muscle weakness (generalized)  Other abnormalities of gait and mobility  Unsteadiness on feet     Problem List Patient Active Problem List   Diagnosis Date Noted  . Xerosis of skin 02/26/2019  . History of angioedema 01/10/2019  . Leukocytosis 09/01/2018  . Severe sepsis ([place]) 08/31/2018  . Spine metastasis ([place]) 03/24/2018  . History of DVT (deep vein thrombosis)   . Prostate cancer metastatic to multiple sites [facility])   . Radicular pain of thoracic region   . Bone lesion   . Chronic anticoagulation   . Right upper quadrant pain 02/26/2018  . Recurrent falls 11/02/2016  . History of prostate cancer 09/22/2016  . Substance abuse ([place]) 01/27/2016  . Vitamin D deficiency 12/05/2015  . Hyperlipidemia 01/28/2015  . Cataracts, bilateral 08/29/2014  . Diabetes mellitus treated with oral medication ([place]) 10/19/2013  . COPD, moderate ([place]) 05/31/2013  . Complex Lt Renal Cyst 03/22/2013  . Preventive measure 05/30/2012  . History of CVA 03/08/2012  . Normocytic anemia 03/08/2012  . CKD (chronic kidney disease) stage 3, GFR 30-59 ml/min ([facility]) 03/08/2012  . ERECTILE DYSFUNCTION 04/25/2008  . HIV disease ([place]) 04/27/2006  . Smoking greater than 30 pack years 04/27/2006  . Major depressive disorder, recurrent episode ([place]) 04/27/2006  .  Hypertension 04/27/2006  . DEGENERATIVE JOINT DISEASE 04/27/2006  . Chronic Low Back Pain 04/27/2006   Ihor Austin, LPTA; CBIS [phone]  Aldona Lento 06/28/2019, 4:56 PM  [place] [address] [place], [place], [address] Phone:  [phone]   Fax:  [phone]  Name: Jacob Joseph MRN: [id number] Date of Birth: Nov 02, 1955

## 2019-06-28 NOTE — Telephone Encounter (Signed)
Returned patient's wife's phone call, lvm for a return call

## 2019-06-29 ENCOUNTER — Other Ambulatory Visit: Payer: Self-pay

## 2019-06-29 ENCOUNTER — Encounter (HOSPITAL_COMMUNITY): Payer: 59

## 2019-06-29 ENCOUNTER — Ambulatory Visit
Admission: RE | Admit: 2019-06-29 | Discharge: 2019-06-29 | Disposition: A | Discharge status: Home / Self Care | Payer: 59 | Source: Ambulatory Visit | Attending: Radiation Oncology | Admitting: Radiation Oncology

## 2019-06-29 DIAGNOSIS — Z510 Encounter for antineoplastic radiation therapy: Secondary | ICD-10-CM | POA: Diagnosis present

## 2019-06-29 DIAGNOSIS — C7951 Secondary malignant neoplasm of bone: Secondary | ICD-10-CM | POA: Insufficient documentation

## 2019-06-29 DIAGNOSIS — C61 Malignant neoplasm of prostate: Secondary | ICD-10-CM | POA: Insufficient documentation

## 2019-07-02 NOTE — Progress Notes (Signed)
  Radiation Oncology         [phone] ________________________________  Name: Jacob Joseph MRN: [id number]  Date: 06/29/2019  DOB: October 07, 1955  SIMULATION AND TREATMENT PLANNING NOTE    ICD-10-CM   1. Spine metastasis ([place])  C79.51   2. Prostate cancer metastatic to multiple sites [facility])  C61     DIAGNOSIS:  63 y.o. patient with lumbar metastasis  NARRATIVE:  The patient was brought to the [facility].  Identity was confirmed.  All relevant records and images related to the planned course of therapy were reviewed.  The patient freely provided informed written consent to proceed with treatment after reviewing the details related to the planned course of therapy. The consent form was witnessed and verified by the simulation staff.  Then, the patient was set-up in a stable reproducible  supine position for radiation therapy.  CT images were obtained.  Surface markings were placed.  The CT images were loaded into the planning software.  Then the target and avoidance structures were contoured including kidneys.  Treatment planning then occurred.  The radiation prescription was entered and confirmed.  Then, I designed and supervised the construction of a total of 3 medically necessary complex treatment devices with VacLoc positioner and 2 MLCs to shield kidneys.  I have requested : 3D Simulation  I have requested a DVH of the following structures: Left Kidney, Right Kidney and target.  PLAN:  The patient will receive 30 Gy in 10 fractions.  ________________________________  Sheral Apley Tammi Klippel, M.D.

## 2019-07-03 DIAGNOSIS — Z510 Encounter for antineoplastic radiation therapy: Secondary | ICD-10-CM | POA: Diagnosis not present

## 2019-07-04 ENCOUNTER — Ambulatory Visit (HOSPITAL_COMMUNITY): Payer: 59

## 2019-07-04 ENCOUNTER — Encounter (HOSPITAL_COMMUNITY): Payer: Self-pay

## 2019-07-04 ENCOUNTER — Telehealth (HOSPITAL_COMMUNITY): Payer: Self-pay

## 2019-07-04 ENCOUNTER — Other Ambulatory Visit: Payer: Self-pay

## 2019-07-04 DIAGNOSIS — R262 Difficulty in walking, not elsewhere classified: Secondary | ICD-10-CM

## 2019-07-04 DIAGNOSIS — M6281 Muscle weakness (generalized): Secondary | ICD-10-CM

## 2019-07-04 DIAGNOSIS — R2681 Unsteadiness on feet: Secondary | ICD-10-CM

## 2019-07-04 DIAGNOSIS — R2689 Other abnormalities of gait and mobility: Secondary | ICD-10-CM

## 2019-07-04 DIAGNOSIS — Z510 Encounter for antineoplastic radiation therapy: Secondary | ICD-10-CM | POA: Diagnosis not present

## 2019-07-04 NOTE — Telephone Encounter (Signed)
pt called to cancel this appt due to he will be in [place] at the cancer center

## 2019-07-04 NOTE — Therapy (Signed)
[place], [place], [address] Phone: [phone]   Fax:  [phone]  Physical Therapy Treatment  Patient Details  Name: Jacob Joseph MRN: [id number] Date of Birth: Mar 16, 1956 Referring Provider (PT): Velna Ochs, MD   Encounter Date: 07/04/2019  PT End of Session - 07/04/19 1254    Visit Number  4    Number of Visits  12    Date for PT Re-Evaluation  08/01/19    Authorization Type  [employer], Secondary: Medicaid    Authorization Time Period  06/20/19 to 07/31/18; complete progress note at visit 10    Authorization - Visit Number  4    Authorization - Number of Visits  10    PT Start Time  1300    PT Stop Time  1340    PT Time Calculation (min)  40 min    Equipment Utilized During Treatment  Gait belt    Activity Tolerance  Patient limited by fatigue   No reports of increased LBP through session   Behavior During Therapy  [facility] for tasks assessed/performed       Past Medical History:  Diagnosis Date  . Abnormal computed tomography of thoracic spine 02/26/2018  . AKI (acute kidney injury) ([place]) 09/24/2016  . BPH associated with nocturia 06/19/2016  . Calcium oxalate crystals in urine 12/23/2011   Asymptomatic, no hematuria. Advised to take plenty of water.  . Calculus of gallbladder without cholecystitis without obstruction   . Closed fracture of proximal end of left humerus with delayed healing 05/26/2018  . COPD (chronic obstructive pulmonary disease) ([place])   . Depression   . Diabetes mellitus without complication ([place]) [id number]   pre diabetic  . Hepatitis C   . HIV (human immunodeficiency virus infection) ([place])   . Hypertension   . PE (pulmonary thromboembolism) ([place]) 05/12/2016   PE in 2017, on chronic anticoagulation for this and recurrent DVT.  [name] Kitchen Peripheral arterial disease ([place])    ooccluded left SFA by duplex ultrasound, tibial disease left greater than right  . Pressure injury of skin  01/13/2019  . Prostate cancer metastatic to multiple sites [facility])   . Stroke ([place]) 11/2011   Carotids Doppler negative. Right sided weakness resolved, initially on Plavix for 3 months and then continued with ASA.    . TB lung, latent 1988   Treated    Past Surgical History:  Procedure Laterality Date  . HERNIA REPAIR    . INGUINAL HERNIA REPAIR  01/16/2012   Procedure: HERNIA REPAIR INGUINAL INCARCERATED;  Surgeon: Zenovia Jarred, MD;  Location: [place];  Service: General;  Laterality: Right;  . TEE WITHOUT CARDIOVERSION  12/07/2011   Procedure: TRANSESOPHAGEAL ECHOCARDIOGRAM (TEE);  Surgeon: Birdie Riddle, MD;  Location: [facility] [place];  Service: Cardiovascular;  Laterality: N/A;    There were no vitals filed for this visit.  Subjective Assessment - 07/04/19 1256    Subjective  Pt presents to therapy with SPC this date. Pt reports extreme soreness after last session. Pt reports chrmo starts this Thursday 12/17.    Pertinent History  Prostate cancer metastatic to multiple sites (LBP), Cancer treatment begins sometime December 2020    Limitations  Standing;Walking    How long can you sit comfortably?  no issues    How long can you stand comfortably?  10 minutes    How long can you walk comfortably?  <10 minutes    Diagnostic tests  06/05/19 MRI: bone metastases increase since  02/27/18, L4-5 disc degeneration, R foraminal stenosis L3-4    Patient Stated Goals  strengthen my legs so that I can stand and walk and stuff    Currently in Pain?  Yes    Pain Score  7     Pain Location  Back    Pain Orientation  Lower    Pain Descriptors / Indicators  Aching;Sore    Pain Type  Chronic pain    Pain Onset  More than a month ago    Pain Frequency  Constant    Aggravating Factors   Constant pain secondary to cancer metastases    Pain Relieving Factors  pain medication temporarily    Effect of Pain on Daily Activities  limited          [facility] Adult PT Treatment/Exercise - 07/04/19 0001       Knee/Hip Exercises: Seated   Long Arc Quad  Both;10 reps    Long Arc Quad Weight  3 lbs.    Other Seated Knee/Hip Exercises  heel raises, 2x15 reps; toe raises, 2x15 reps    Marching  Both;10 reps    Marching Weights  3 lbs.    Abduction/Adduction   10 reps    Abd/Adduction Limitations  GTB    Sit to Sand  10 reps   2, 2, 1, 3; unable to perform 10 due to fatigue         Balance Exercises - 07/04/19 1321      Balance Exercises: Standing   Tandem Stance  5 reps;10 secs    Other Standing Exercises  weave around 5 cones, SPC, x1.5 rounds through, unable to complete 2 due to fatigue        PT Education - 07/04/19 1254    Education Details  Exercise technique, continue HEP    Person(s) Educated  Patient    Methods  Explanation    Comprehension  Verbalized understanding       PT Short Term Goals - 06/27/19 1352      PT SHORT TERM GOAL #1   Title  Pt will perform HEP correctly and consistently, at least 2x/week to improve ambulation tolerance and reduce incidence of falls.    Time  3    Period  Weeks    Status  On-going    Target Date  07/11/19      PT SHORT TERM GOAL #2   Title  Pt will perform 30 sec STS at 5 reps with BUE assist and rollator to improve ability to rise from chair and move about house.    Time  3    Period  Weeks    Status  On-going      PT SHORT TERM GOAL #3   Title  Pt will complete 2MWT at 200 feet with LRAD, without seated rest break to demo improve cardiovascular endurance and muscle strength.    Time  3    Period  Weeks    Status  On-going        PT Long Term Goals - 06/27/19 1352      PT LONG TERM GOAL #1   Title  Pt will score 19 on DGI to indicate reduction in risk for falls with mobility around the home and in the community.    Time  6    Period  Weeks    Status  On-going      PT LONG TERM GOAL #2   Title  Pt will perform 30 sec STS at  8 reps with BUE assist and rollator to improve ability to rise from chair and move about house.     Time  6    Period  Weeks    Status  On-going      PT LONG TERM GOAL #3   Title  Pt will complete 2MWT at 350 feet with LRAD, without seated rest break to demo improve cardiovascular endurance and muscle strength.    Time  6    Period  Weeks    Status  On-going      PT LONG TERM GOAL #4   Title  Pt will self report <10 falls since starting therapy to indicate improved strength and safety with mobility and improve overall quality of life.    Time  6    Period  Weeks    Status  On-going            Plan - 07/04/19 1255    Clinical Impression Statement  Added tandem stance this date with increased difficulty with RLE back. Pt requires UE assist on parallel bars every 5-8 sec to maintain upright balance with both legs. Pt continues to require therapeutic rest breaks with water to minimize soreness and allow recovery between exercises due to poor endurance. Pt weaved around 5 cones positioned 18 inches apart, attempted to complete course 2 rounds through, but required return to seated chair due to fatigue. Pt knocked 2 cones over, able to clear both feet with each short stop, but demonstrates static bil knee flexion and x2 L knee buckling requiring min assist from therapist to recover. Pt only able to tolerate 8 STS reps this date, using rocking momentum, requires bil UE pushing on thighs and multiple attempts to rise from chair, with uncontrolled descent despite verbal cues due to weakness. Pt reports some increase in LBP with exercise, but "not too bad". Continue to progress as able.    Personal Factors and Comorbidities  Comorbidity 3+    Comorbidities  Prostate cancer metastatic to multiple sites (LBP), stroke 2013, COPD,    Examination-Activity Limitations  Bed Mobility;Locomotion Level;Stairs;Stand;Transfers    Examination-Participation Restrictions  Cleaning    Stability/Clinical Decision Making  Evolving/Moderate complexity    Rehab Potential  Fair    PT Frequency  2x / week    PT  Duration  6 weeks    PT Treatment/Interventions  ADLs/Self Care Home Management;Aquatic Therapy;Cryotherapy;DME Instruction;Gait training;Stair training;Functional mobility training;Therapeutic activities;Therapeutic exercise;Balance training;Neuromuscular re-education;Patient/family education;Orthotic Fit/Training;Manual techniques;Passive range of motion;Dry needling;Energy conservation;Joint Manipulations    PT Next Visit Plan  Progress exercises and balance to standing as able. Low level strengthening of BLE, avoid positions that increase LBP due to prostate cancer metastases. Respect pt's fatigue and pain levels    PT Home Exercise Plan  Eval: seated heel raises, seated toe raises, STS; 12/8: seated abduction with GTB    Consulted and Agree with Plan of Care  Patient       Patient will benefit from skilled therapeutic intervention in order to improve the following deficits and impairments:  Abnormal gait, Cardiopulmonary status limiting activity, Decreased activity tolerance, Decreased balance, Decreased endurance, Decreased mobility, Decreased strength, Difficulty walking, Pain  Visit Diagnosis: Difficulty in walking, not elsewhere classified  Muscle weakness (generalized)  Other abnormalities of gait and mobility  Unsteadiness on feet     Problem List Patient Active Problem List   Diagnosis Date Noted  . Xerosis of skin 02/26/2019  . History of angioedema 01/10/2019  . Leukocytosis 09/01/2018  . Severe  sepsis ([place]) 08/31/2018  . Spine metastasis ([place]) 03/24/2018  . History of DVT (deep vein thrombosis)   . Prostate cancer metastatic to multiple sites [facility])   . Radicular pain of thoracic region   . Bone lesion   . Chronic anticoagulation   . Right upper quadrant pain 02/26/2018  . Recurrent falls 11/02/2016  . History of prostate cancer 09/22/2016  . Substance abuse ([place]) 01/27/2016  . Vitamin D deficiency 12/05/2015  . Hyperlipidemia 01/28/2015  . Cataracts, bilateral  08/29/2014  . Diabetes mellitus treated with oral medication ([place]) 10/19/2013  . COPD, moderate ([place]) 05/31/2013  . Complex Lt Renal Cyst 03/22/2013  . Preventive measure 05/30/2012  . History of CVA 03/08/2012  . Normocytic anemia 03/08/2012  . CKD (chronic kidney disease) stage 3, GFR 30-59 ml/min ([facility]) 03/08/2012  . ERECTILE DYSFUNCTION 04/25/2008  . HIV disease ([place]) 04/27/2006  . Smoking greater than 30 pack years 04/27/2006  . Major depressive disorder, recurrent episode ([place]) 04/27/2006  . Hypertension 04/27/2006  . DEGENERATIVE JOINT DISEASE 04/27/2006  . Chronic Low Back Pain 04/27/2006     Talbot Grumbling PT, DPT 07/04/19, 1:40 PM [place] [address] [place], [place], [address] Phone: [phone]   Fax:  [phone]  Name: Edwyn Yeates MRN: [id number] Date of Birth: 1956-04-26

## 2019-07-05 ENCOUNTER — Ambulatory Visit: Payer: 59

## 2019-07-05 ENCOUNTER — Other Ambulatory Visit: Payer: Self-pay | Admitting: Internal Medicine

## 2019-07-05 DIAGNOSIS — M5137 Other intervertebral disc degeneration, lumbosacral region: Secondary | ICD-10-CM

## 2019-07-05 MED ORDER — HYDROCODONE-ACETAMINOPHEN 5-325 MG PO TABS: 1.0000 | ORAL_TABLET | Freq: Two times a day (BID) | ORAL | 0 refills | Status: DC | PRN

## 2019-07-05 NOTE — Telephone Encounter (Signed)
Refill Request  HYDROcodone-acetaminophen (NORCO/VICODIN) 5-325 MG tablet  [id number] - [place], [place] - [place] [address]

## 2019-07-06 ENCOUNTER — Other Ambulatory Visit: Payer: Self-pay

## 2019-07-06 ENCOUNTER — Ambulatory Visit
Admission: RE | Admit: 2019-07-06 | Discharge: 2019-07-06 | Disposition: A | Discharge status: Home / Self Care | Payer: 59 | Source: Ambulatory Visit | Attending: Radiation Oncology | Admitting: Radiation Oncology

## 2019-07-06 ENCOUNTER — Encounter (HOSPITAL_COMMUNITY): Payer: 59

## 2019-07-06 ENCOUNTER — Ambulatory Visit: Payer: 59

## 2019-07-06 ENCOUNTER — Other Ambulatory Visit: Payer: Self-pay | Admitting: Internal Medicine

## 2019-07-06 DIAGNOSIS — Z510 Encounter for antineoplastic radiation therapy: Secondary | ICD-10-CM | POA: Diagnosis not present

## 2019-07-06 NOTE — Telephone Encounter (Signed)
Pls contact pt medicine was sent to the wrong address [phone]

## 2019-07-06 NOTE — Telephone Encounter (Signed)
Please see note on refill request from yesterday. Hubbard Hartshorn, BSN, RN-BC

## 2019-07-06 NOTE — Telephone Encounter (Addendum)
Patient requested refill at [facility] in [place] but it was sent to [facility] in [place]. Rx cancelled in [place]. Please resend to [place]. Attempted to relay this info to patient but there was no answer after many rings and no VM set up. Hubbard Hartshorn, BSN, RN-BC

## 2019-07-06 NOTE — Addendum Note (Signed)
Addended by: Velora Heckler on: 07/06/2019 11:42 AM   Modules accepted: Orders

## 2019-07-07 ENCOUNTER — Ambulatory Visit
Admission: RE | Admit: 2019-07-07 | Discharge: 2019-07-07 | Disposition: A | Discharge status: Home / Self Care | Payer: 59 | Source: Ambulatory Visit | Attending: Radiation Oncology | Admitting: Radiation Oncology

## 2019-07-07 ENCOUNTER — Ambulatory Visit: Payer: 59

## 2019-07-07 ENCOUNTER — Other Ambulatory Visit: Payer: Self-pay

## 2019-07-07 DIAGNOSIS — Z510 Encounter for antineoplastic radiation therapy: Secondary | ICD-10-CM | POA: Diagnosis not present

## 2019-07-07 MED ORDER — HYDROCODONE-ACETAMINOPHEN 5-325 MG PO TABS: 1.0000 | ORAL_TABLET | Freq: Two times a day (BID) | ORAL | 0 refills | Status: DC | PRN

## 2019-07-10 ENCOUNTER — Other Ambulatory Visit: Payer: Self-pay

## 2019-07-10 ENCOUNTER — Telehealth: Payer: Self-pay | Admitting: *Deleted

## 2019-07-10 ENCOUNTER — Ambulatory Visit
Admission: RE | Admit: 2019-07-10 | Discharge: 2019-07-10 | Disposition: A | Discharge status: Home / Self Care | Payer: 59 | Source: Ambulatory Visit | Attending: Radiation Oncology | Admitting: Radiation Oncology

## 2019-07-10 ENCOUNTER — Ambulatory Visit: Payer: 59

## 2019-07-10 DIAGNOSIS — Z510 Encounter for antineoplastic radiation therapy: Secondary | ICD-10-CM | POA: Diagnosis not present

## 2019-07-10 NOTE — Telephone Encounter (Signed)
CALLED PATIENT TO REMIND OF LAB AND WEIGHT FOR 07-11-19 @ 11:30 AM, LVM FOR A RETURN CALL

## 2019-07-11 ENCOUNTER — Ambulatory Visit
Admission: RE | Admit: 2019-07-11 | Discharge: 2019-07-11 | Disposition: A | Discharge status: Home / Self Care | Payer: 59 | Source: Ambulatory Visit | Attending: Radiation Oncology | Admitting: Radiation Oncology

## 2019-07-11 ENCOUNTER — Other Ambulatory Visit: Payer: Self-pay | Admitting: Radiation Oncology

## 2019-07-11 ENCOUNTER — Ambulatory Visit: Payer: 59

## 2019-07-11 ENCOUNTER — Other Ambulatory Visit: Payer: Self-pay

## 2019-07-11 ENCOUNTER — Telehealth (HOSPITAL_COMMUNITY): Payer: Self-pay

## 2019-07-11 ENCOUNTER — Ambulatory Visit (HOSPITAL_COMMUNITY): Payer: 59

## 2019-07-11 DIAGNOSIS — C7952 Secondary malignant neoplasm of bone marrow: Secondary | ICD-10-CM

## 2019-07-11 DIAGNOSIS — Z510 Encounter for antineoplastic radiation therapy: Secondary | ICD-10-CM | POA: Diagnosis not present

## 2019-07-11 DIAGNOSIS — C7951 Secondary malignant neoplasm of bone: Secondary | ICD-10-CM

## 2019-07-11 LAB — CBC WITH DIFFERENTIAL (CANCER CENTER ONLY)
Abs Immature Granulocytes: 0.04 10*3/uL (ref 0.00–0.07)
Basophils Absolute: 0 10*3/uL (ref 0.0–0.1)
Basophils Relative: 0 %
Eosinophils Absolute: 0.3 10*3/uL (ref 0.0–0.5)
Eosinophils Relative: 4 %
HCT: 31.1 % — ABNORMAL LOW (ref 39.0–52.0)
Hemoglobin: 9.8 g/dL — ABNORMAL LOW (ref 13.0–17.0)
Immature Granulocytes: 1 %
Lymphocytes Relative: 15 %
Lymphs Abs: 1.2 10*3/uL (ref 0.7–4.0)
MCH: 25.1 pg — ABNORMAL LOW (ref 26.0–34.0)
MCHC: 31.5 g/dL (ref 30.0–36.0)
MCV: 79.7 fL — ABNORMAL LOW (ref 80.0–100.0)
Monocytes Absolute: 0.5 10*3/uL (ref 0.1–1.0)
Monocytes Relative: 6 %
Neutro Abs: 5.6 10*3/uL (ref 1.7–7.7)
Neutrophils Relative %: 74 %
Platelet Count: 300 10*3/uL (ref 150–400)
RBC: 3.9 MIL/uL — ABNORMAL LOW (ref 4.22–5.81)
RDW: 17.4 % — ABNORMAL HIGH (ref 11.5–15.5)
WBC Count: 7.7 10*3/uL (ref 4.0–10.5)
nRBC: 0 % (ref 0.0–0.2)

## 2019-07-11 NOTE — Telephone Encounter (Signed)
Pt had to cancel appt because he did not know that [facility] was not running this week

## 2019-07-11 NOTE — Telephone Encounter (Signed)
pt called to cancel his appt due to no transportation.

## 2019-07-12 ENCOUNTER — Other Ambulatory Visit: Payer: Self-pay

## 2019-07-12 ENCOUNTER — Ambulatory Visit: Payer: 59 | Admitting: Podiatry

## 2019-07-12 ENCOUNTER — Ambulatory Visit
Admission: RE | Admit: 2019-07-12 | Discharge: 2019-07-12 | Disposition: A | Discharge status: Home / Self Care | Payer: 59 | Source: Ambulatory Visit | Attending: Radiation Oncology | Admitting: Radiation Oncology

## 2019-07-12 ENCOUNTER — Encounter (HOSPITAL_COMMUNITY): Payer: 59

## 2019-07-12 ENCOUNTER — Ambulatory Visit: Payer: 59

## 2019-07-12 ENCOUNTER — Ambulatory Visit (HOSPITAL_COMMUNITY): Payer: 59

## 2019-07-12 DIAGNOSIS — Z510 Encounter for antineoplastic radiation therapy: Secondary | ICD-10-CM | POA: Diagnosis not present

## 2019-07-13 ENCOUNTER — Ambulatory Visit: Payer: 59

## 2019-07-13 ENCOUNTER — Other Ambulatory Visit: Payer: Self-pay

## 2019-07-13 ENCOUNTER — Ambulatory Visit
Admission: RE | Admit: 2019-07-13 | Discharge: 2019-07-13 | Disposition: A | Discharge status: Home / Self Care | Payer: 59 | Source: Ambulatory Visit | Attending: Radiation Oncology | Admitting: Radiation Oncology

## 2019-07-13 DIAGNOSIS — Z510 Encounter for antineoplastic radiation therapy: Secondary | ICD-10-CM | POA: Diagnosis not present

## 2019-07-17 ENCOUNTER — Telehealth: Payer: Self-pay | Admitting: *Deleted

## 2019-07-17 ENCOUNTER — Ambulatory Visit: Payer: 59

## 2019-07-17 ENCOUNTER — Ambulatory Visit
Admission: RE | Admit: 2019-07-17 | Discharge: 2019-07-17 | Disposition: A | Discharge status: Home / Self Care | Payer: 59 | Source: Ambulatory Visit | Attending: Radiation Oncology | Admitting: Radiation Oncology

## 2019-07-17 ENCOUNTER — Other Ambulatory Visit: Payer: Self-pay

## 2019-07-17 DIAGNOSIS — Z510 Encounter for antineoplastic radiation therapy: Secondary | ICD-10-CM | POA: Diagnosis not present

## 2019-07-17 NOTE — Telephone Encounter (Signed)
CALLED PATIENT TO REMIND OF XOFIGO INJ. - ARRIVAL TIME - 12:45 PM @ [facility], SPOKE WITH PATIENT AND HE IS AWARE OF THIS INJ.

## 2019-07-18 ENCOUNTER — Ambulatory Visit: Payer: 59

## 2019-07-18 ENCOUNTER — Other Ambulatory Visit: Payer: Self-pay

## 2019-07-18 ENCOUNTER — Encounter (HOSPITAL_COMMUNITY): Payer: 59

## 2019-07-18 ENCOUNTER — Ambulatory Visit (HOSPITAL_COMMUNITY)
Admission: RE | Admit: 2019-07-18 | Discharge: 2019-07-18 | Disposition: A | Discharge status: Home / Self Care | Payer: 59 | Source: Ambulatory Visit | Attending: Radiation Oncology | Admitting: Radiation Oncology

## 2019-07-18 ENCOUNTER — Ambulatory Visit
Admission: RE | Admit: 2019-07-18 | Discharge: 2019-07-18 | Disposition: A | Discharge status: Home / Self Care | Payer: 59 | Source: Ambulatory Visit | Attending: Radiation Oncology | Admitting: Radiation Oncology

## 2019-07-18 DIAGNOSIS — C61 Malignant neoplasm of prostate: Secondary | ICD-10-CM | POA: Insufficient documentation

## 2019-07-18 DIAGNOSIS — Z510 Encounter for antineoplastic radiation therapy: Secondary | ICD-10-CM | POA: Diagnosis not present

## 2019-07-18 DIAGNOSIS — C7951 Secondary malignant neoplasm of bone: Secondary | ICD-10-CM | POA: Insufficient documentation

## 2019-07-18 MED ORDER — RADIUM RA 223 DICHLORIDE 30 MCCI/ML IV SOLN
149.0300 | Freq: Once | INTRAVENOUS | Status: AC
  Administered 2019-07-18: 149.03 via INTRAVENOUS

## 2019-07-19 ENCOUNTER — Other Ambulatory Visit: Payer: Self-pay

## 2019-07-19 ENCOUNTER — Inpatient Hospital Stay (HOSPITAL_BASED_OUTPATIENT_CLINIC_OR_DEPARTMENT_OTHER): Payer: 59 | Admitting: Oncology

## 2019-07-19 ENCOUNTER — Inpatient Hospital Stay: Payer: 59 | Attending: Oncology

## 2019-07-19 ENCOUNTER — Ambulatory Visit: Payer: 59

## 2019-07-19 ENCOUNTER — Ambulatory Visit
Admission: RE | Admit: 2019-07-19 | Discharge: 2019-07-19 | Disposition: A | Discharge status: Home / Self Care | Payer: 59 | Source: Ambulatory Visit | Attending: Radiation Oncology | Admitting: Radiation Oncology

## 2019-07-19 VITALS — BP 138/97 | HR 107 | Temp 99.1°F | Resp 18 | Ht 72.0 in | Wt 222.6 lb

## 2019-07-19 DIAGNOSIS — Z923 Personal history of irradiation: Secondary | ICD-10-CM | POA: Diagnosis not present

## 2019-07-19 DIAGNOSIS — C61 Malignant neoplasm of prostate: Secondary | ICD-10-CM

## 2019-07-19 DIAGNOSIS — Z79899 Other long term (current) drug therapy: Secondary | ICD-10-CM | POA: Diagnosis not present

## 2019-07-19 DIAGNOSIS — Z888 Allergy status to other drugs, medicaments and biological substances status: Secondary | ICD-10-CM | POA: Insufficient documentation

## 2019-07-19 DIAGNOSIS — Z7901 Long term (current) use of anticoagulants: Secondary | ICD-10-CM | POA: Insufficient documentation

## 2019-07-19 DIAGNOSIS — Z510 Encounter for antineoplastic radiation therapy: Secondary | ICD-10-CM | POA: Diagnosis not present

## 2019-07-19 DIAGNOSIS — M545 Low back pain: Secondary | ICD-10-CM | POA: Diagnosis not present

## 2019-07-19 DIAGNOSIS — C7951 Secondary malignant neoplasm of bone: Secondary | ICD-10-CM | POA: Insufficient documentation

## 2019-07-19 LAB — CBC WITH DIFFERENTIAL (CANCER CENTER ONLY)
Abs Immature Granulocytes: 0.05 10*3/uL (ref 0.00–0.07)
Basophils Absolute: 0 10*3/uL (ref 0.0–0.1)
Basophils Relative: 0 %
Eosinophils Absolute: 0.4 10*3/uL (ref 0.0–0.5)
Eosinophils Relative: 5 %
HCT: 30.5 % — ABNORMAL LOW (ref 39.0–52.0)
Hemoglobin: 9.5 g/dL — ABNORMAL LOW (ref 13.0–17.0)
Immature Granulocytes: 1 %
Lymphocytes Relative: 10 %
Lymphs Abs: 0.9 10*3/uL (ref 0.7–4.0)
MCH: 24.9 pg — ABNORMAL LOW (ref 26.0–34.0)
MCHC: 31.1 g/dL (ref 30.0–36.0)
MCV: 79.8 fL — ABNORMAL LOW (ref 80.0–100.0)
Monocytes Absolute: 0.7 10*3/uL (ref 0.1–1.0)
Monocytes Relative: 8 %
Neutro Abs: 6.4 10*3/uL (ref 1.7–7.7)
Neutrophils Relative %: 76 %
Platelet Count: 268 10*3/uL (ref 150–400)
RBC: 3.82 MIL/uL — ABNORMAL LOW (ref 4.22–5.81)
RDW: 17.6 % — ABNORMAL HIGH (ref 11.5–15.5)
WBC Count: 8.3 10*3/uL (ref 4.0–10.5)
nRBC: 0 % (ref 0.0–0.2)

## 2019-07-19 LAB — CMP (CANCER CENTER ONLY)
ALT: 11 U/L (ref 0–44)
AST: 14 U/L — ABNORMAL LOW (ref 15–41)
Albumin: 3.4 g/dL — ABNORMAL LOW (ref 3.5–5.0)
Alkaline Phosphatase: 134 U/L — ABNORMAL HIGH (ref 38–126)
Anion gap: 14 (ref 5–15)
BUN: 14 mg/dL (ref 8–23)
CO2: 20 mmol/L — ABNORMAL LOW (ref 22–32)
Calcium: 9.5 mg/dL (ref 8.9–10.3)
Chloride: 107 mmol/L (ref 98–111)
Creatinine: 1.51 mg/dL — ABNORMAL HIGH (ref 0.61–1.24)
GFR, Est AFR Am: 57 mL/min — ABNORMAL LOW (ref >60)
GFR, Estimated: 49 mL/min — ABNORMAL LOW (ref >60)
Glucose, Bld: 161 mg/dL — ABNORMAL HIGH (ref 70–99)
Potassium: 3.6 mmol/L (ref 3.5–5.1)
Sodium: 141 mmol/L (ref 135–145)
Total Bilirubin: <0.2 mg/dL — ABNORMAL LOW (ref 0.3–1.2)
Total Protein: 8.1 g/dL (ref 6.5–8.1)

## 2019-07-19 NOTE — Progress Notes (Signed)
Hematology and Oncology Follow Up Visit  Jacob Joseph [id number] [birth year] 63 y.o. 07/19/2019 10:04 AM Alvina Chou, [place], MD   Principle Diagnosis: 63 year old man with advanced prostate cancer with disease to the bone diagnosed in 2017.  He has castration-resistant at this time.   Prior Therapy: He was treated with Lupron under the care of [name] every 4 months since this time of diagnosis.  He is status post radiation therapy for a total of 30 Gy in 10 fractions to be completed in September 2019 to the thoracic spine.  Zytiga 1000 mg daily with prednisone at 5 mg daily started in August 2019.  Therapy discontinued in October 2020 for progression of disease.     Current therapy:   Radiation therapy to the lumbar spine.  He is to complete 30 Gy in 10 fractions.  Xofigo monthly started on 07/18/2019.  Casodex 50 mg daily.  Androgen deprivation therapy currently under the care of Dr. Tresa Moore at [facility].  Interim History: [name] returns today for a follow-up visit.  Since last visit, he is receiving palliative radiation therapy to the lumbar spine without any major complications.  He has tolerated it well with improvement in his pain although he still have pain that is noticeable although improved.  He continues to ambulate short distances with the help of a cane without any recent falls or syncope.  His performance status and quality of life remains reasonable at this time.   He denied headaches, blurry vision, syncope or seizures.  Denies any fevers, chills or sweats.  Denied chest pain, palpitation, orthopnea or leg edema.  Denied cough, wheezing or hemoptysis.  Denied nausea, vomiting or abdominal pain.  Denies any constipation or diarrhea.  Denies any frequency urgency or hesitancy.  Denies any arthralgias or myalgias.  Denies any skin rashes or lesions.  Denies any bleeding or clotting tendency.  Denies any easy bruising.  Denies any hair  or nail changes.  Denies any anxiety or depression.  Remaining review of system is negative.     Medications: Unchanged on review. Current Outpatient Medications  Medication Sig Dispense Refill  . apixaban (ELIQUIS) 2.5 MG TABS tablet Take 1 tablet (2.5 mg total) by mouth 2 (two) times daily. 180 tablet 3  . aspirin EC 81 MG tablet Take 81 mg by mouth daily.    [name] Kitchen azelastine (ASTELIN) 0.1 % nasal spray USE 1 SPRAY IN EACH NOSTRIL TWICE DAILY AS NEEDED FOR RHINITIS 90 mL 1  . bicalutamide (CASODEX) 50 MG tablet Take 50 mg by mouth daily.    . bictegravir-emtricitabine-tenofovir AF (BIKTARVY) 50-200-25 MG TABS tablet Take 1 tablet by mouth daily. 30 tablet 11  . CHANTIX 0.5 MG tablet Take 0.5 mg by mouth daily. Then take 1 tablet by mouth twice daily.    . cholecalciferol (VITAMIN D) 1000 units tablet Take 1 tablet (1,000 Units total) by mouth daily. (Patient taking differently: Take 2,000 Units by mouth daily. ) 90 tablet 3  . ciclopirox (LOPROX) 0.77 % cream APPLY TOPICALLY TO THE AFFECTED AREA TWICE DAILY 30 g 1  . citalopram (CELEXA) 20 MG tablet Take 20 mg by mouth daily.    [name] Kitchen DILT-XR 120 MG 24 hr capsule TAKE 1 CAPSULE(120 MG) BY MOUTH DAILY 90 capsule 0  . FLOVENT HFA 220 MCG/ACT inhaler USE 1 PUFF BY MOUTH TWICE DAILY 12 g 2  . gabapentin (NEURONTIN) 300 MG capsule Take 2 capsules (600 mg total) by mouth 2 (two) times daily. 120 capsule  2  . HYDROcodone-acetaminophen (NORCO/VICODIN) 5-325 MG tablet Take 1 tablet by mouth every 12 (twelve) hours as needed for moderate pain or severe pain. 40 tablet 0  . hydrOXYzine (VISTARIL) 50 MG capsule Take 50 mg by mouth at bedtime.     . metFORMIN (GLUCOPHAGE) 1000 MG tablet Take 1 tablet (1,000 mg total) by mouth 2 (two) times daily with a meal. (Patient taking differently: Take 500 mg by mouth 2 (two) times daily with a meal. ) 180 tablet 1  . pravastatin (PRAVACHOL) 40 MG tablet TAKE 1 TABLET(40 MG) BY MOUTH DAILY 90 tablet 1  . QUEtiapine  (SEROQUEL) 400 MG tablet Take 1 tablet (400 mg total) by mouth 2 (two) times daily. (Patient taking differently: Take 800 mg by mouth at bedtime. ) 60 tablet 0  . QUEtiapine (SEROQUEL) 50 MG tablet Take 50 mg by mouth 3 (three) times daily.    [name] Kitchen STIOLTO RESPIMAT 2.5-2.5 MCG/ACT AERS INL 2 PUFFS PO ITL D    . tamsulosin (FLOMAX) 0.4 MG CAPS capsule TAKE 1 CAPSULE(0.4 MG) BY MOUTH DAILY AFTER BREAKFAST (Patient taking differently: Take 0.4 mg by mouth daily after breakfast. ) 90 capsule 3  . valACYclovir (VALTREX) 500 MG tablet TAKE 1 TABLET BY MOUTH DAILY 90 tablet 2  . vitamin E 200 UNIT capsule Take 200 Units by mouth daily.     No current facility-administered medications for this visit.     Allergies:  Allergies  Allergen Reactions  . Lisinopril Swelling  . Statins Other (See Comments)    Elevated liver enzymes.    Past Medical History, Surgical history, Social history, and Family History updated without any changes.   Physical Exam:  Blood pressure (!) 138/97, pulse (!) 107, temperature 99.1 F (37.3 C), temperature source Temporal, resp. rate 18, height 6' (1.829 m), weight 222 lb 9.6 oz (101 kg), SpO2 98 %.     ECOG: 1   General appearance: Comfortable appearing without any discomfort Head: Normocephalic without any trauma Oropharynx: Mucous membranes are moist and pink without any thrush or ulcers. Eyes: Pupils are equal and round reactive to light. Lymph nodes: No cervical, supraclavicular, inguinal or axillary lymphadenopathy.   Heart:regular rate and rhythm.  S1 and S2 without leg edema. Lung: Clear without any rhonchi or wheezes.  No dullness to percussion. Abdomin: Soft, nontender, nondistended with good bowel sounds.  No hepatosplenomegaly. Musculoskeletal: No joint deformity or effusion.  Full range of motion noted. Neurological: No deficits noted on motor, sensory and deep tendon reflex exam. Skin: No petechial rash or dryness.  Appeared moist.          Lab Results: Lab Results  Component Value Date   WBC 7.7 07/11/2019   HGB 9.8 (L) 07/11/2019   HCT 31.1 (L) 07/11/2019   MCV 79.7 (L) 07/11/2019   PLT 300 07/11/2019     Chemistry      Component Value Date/Time   NA 140 05/19/2019 1258   NA 140 01/27/2016 1527   K 3.3 (L) 05/19/2019 1258   CL 102 05/19/2019 1258   CO2 21 (L) 05/19/2019 1258   BUN 10 05/19/2019 1258   BUN 14 01/27/2016 1527   CREATININE 1.45 (H) 05/19/2019 1258   CREATININE 1.06 08/24/2018 0919      Component Value Date/Time   CALCIUM 10.0 05/19/2019 1258   ALKPHOS 133 (H) 05/19/2019 1258   AST 20 05/19/2019 1258   ALT 11 05/19/2019 1258   BILITOT <0.2 (L) 05/19/2019 1258  Results for JEDIAEL, PLUTE (MRN [id number]) as of 07/19/2019 10:06  Ref. Range 09/30/2018 13:43 02/02/2019 13:24 05/19/2019 12:58  Prostate Specific Ag, Serum Latest Ref Range: 0.0 - 4.0 ng/mL 5.5 (H) 10.5 (H) 33.2 (H)        Impression and Plan:  63 year old man with the:  1.    A castration-resistant prostate cancer with disease to the bone noted in 2017.     He is currently receiving Xofigo after progression on therapy outlined above.  Risks and benefits of continuing this therapy was discussed today.  Alternative option would be systemic chemotherapy utilizing Taxotere.  This would be an option if he develops rapid progression of disease while on Xofigo.  He is agreeable with this plan and will continue to monitor periodically.    2.  Androgen deprivation: I recommended continuing this indefinitely.  He is currently receiving it under the care of [facility].  3.  Lower back pain: Improved with palliative radiation therapy.  4.  Bone directed therapy: Trudi Ida has been deferred at this time until he obtains dental clearance.  He remains on calcium and vitamin D supplements.  5.  Prognosis and goals of care: His disease is incurable and any therapy is palliative at this time.   Aggressive measures are warranted given his performance status  6.  Follow-up: In 3 months for repeat follow-up.   25 minutes was spent with the patient face-to-face today.  More than 50% of time was spent on updating his disease status, discussing treatment options and addressing complications related to his cancer and cancer therapy.      Zola Button, MD 12/30/202010:04 AM

## 2019-07-20 ENCOUNTER — Other Ambulatory Visit: Payer: Self-pay

## 2019-07-20 ENCOUNTER — Ambulatory Visit (HOSPITAL_COMMUNITY): Payer: 59 | Admitting: Physical Therapy

## 2019-07-20 ENCOUNTER — Encounter: Payer: Self-pay | Admitting: Radiation Oncology

## 2019-07-20 ENCOUNTER — Telehealth: Payer: Self-pay | Admitting: Oncology

## 2019-07-20 ENCOUNTER — Ambulatory Visit
Admission: RE | Admit: 2019-07-20 | Discharge: 2019-07-20 | Disposition: A | Discharge status: Home / Self Care | Payer: 59 | Source: Ambulatory Visit | Attending: Radiation Oncology | Admitting: Radiation Oncology

## 2019-07-20 DIAGNOSIS — Z510 Encounter for antineoplastic radiation therapy: Secondary | ICD-10-CM | POA: Diagnosis not present

## 2019-07-20 LAB — PROSTATE-SPECIFIC AG, SERUM (LABCORP): Prostate Specific Ag, Serum: 67.6 ng/mL — ABNORMAL HIGH (ref 0.0–4.0)

## 2019-07-20 NOTE — Telephone Encounter (Signed)
Scheduled appt per 12/30 los.  Sent a message to HIM pool to get a calendar mailed out. 

## 2019-07-23 NOTE — Progress Notes (Signed)
  Radiation Oncology         [phone] ________________________________  Name: Jacob Joseph MRN: [id number]  Date: 07/18/2019  DOB: [birth year]  Radium-223 Infusion Note  Diagnosis:  Castration resistant prostate cancer with painful bone involvement  Current Infusion:    1  Planned Infusions:  6  Narrative: Jacob Joseph presented to nuclear medicine for treatment. His most recent blood counts were reviewed.  He remains a good candidate to proceed with Ra-223.  The patient was situated in an infusion suite with a contact barrier placed under his arm. Intravenous access was established, using sterile technique, and a normal saline infusion from a syringe was started.  Micro-dosimetry:  The prescribed radiation activity was assayed and confirmed to be within specified tolerance.  Special Treatment Procedure - Infusion:  The nuclear medicine technologist and I personally verified the dose activity to be delivered as specified in the written directive, and verified the patient identification via 2 separate methods.  The syringe containing the dose was attached to an intravenous access and the dose delivered over a minute. No complications were noted.  The total administered dose was 153.5 microcuries.   A saline flush of the line and the syringe that contained the isotope was then performed.  The residual radioactivity in the syringe was 4.5 microcuries, so the actual infused isotope activity was 149.0 microcuries.   Pressure was applied to the venipuncture site, and a compression bandage placed.   Radiation Safety personnel were present to perform the discharge survey, as detailed on their documentation.   After a short period of observation, the patient had his IV removed.  Impression:  The patient tolerated his infusion relatively well.  Plan:  The patient will return in one month for ongoing care.    ________________________________  Sheral Apley. Tammi Klippel, M.D.

## 2019-07-23 NOTE — Progress Notes (Signed)
  Radiation Oncology         [phone] ________________________________  Name: Jacob Joseph MRN: [id number]  Date: 07/20/2019  DOB: 1956-04-02  End of Treatment Note  Diagnosis:   64 y.o. patient with lumbar metastasis     Indication for treatment:  Palliation of pain, prevention of spinal cord injury       Radiation treatment dates:   12/17-12/31/20  Site/dose:   T12-L2 was treated to 30 Gy in 10 fractions of 3 Gy  Beams/energy:   A 3D treatment arrangement was established using anterior and posterior 15 megavolt x-ray fields excluding the left and right kidney maximally.  The upper edge of the field at the T12 region was custom blocked to shield the spinal cord centrally using a "notch block" to minimize overlap with his previously irradiated chest fields.  Narrative: The patient tolerated radiation treatment relatively well.   He experienced improvement in pain during radiation  Plan: The patient has completed radiation treatment. The patient will return to radiation oncology clinic for routine followup in one month. I advised him to call or return sooner if he has any questions or concerns related to his recovery or treatment.  He will continue to receive monthly Xofigo infusions. ________________________________  Sheral Apley Tammi Klippel, M.D.

## 2019-07-24 ENCOUNTER — Other Ambulatory Visit: Payer: Self-pay | Admitting: Podiatry

## 2019-07-25 ENCOUNTER — Encounter (HOSPITAL_COMMUNITY): Payer: Self-pay | Admitting: General Practice

## 2019-07-25 ENCOUNTER — Other Ambulatory Visit (HOSPITAL_COMMUNITY): Payer: Self-pay | Admitting: Radiation Oncology

## 2019-07-25 ENCOUNTER — Telehealth (HOSPITAL_COMMUNITY): Payer: Self-pay | Admitting: Physical Therapy

## 2019-07-25 ENCOUNTER — Encounter (HOSPITAL_COMMUNITY): Payer: 59

## 2019-07-25 ENCOUNTER — Telehealth: Payer: Self-pay | Admitting: *Deleted

## 2019-07-25 DIAGNOSIS — C61 Malignant neoplasm of prostate: Secondary | ICD-10-CM

## 2019-07-25 DIAGNOSIS — C7951 Secondary malignant neoplasm of bone: Secondary | ICD-10-CM

## 2019-07-25 NOTE — Telephone Encounter (Signed)
CALLED PATIENT TO INFORM OF LAB AND WEIGHT ON 08-08-19 @ 12 PM @ [place] [facility]. ON 08-15-19 - ARRIVAL TIME- 11:15 AM @ [facility], LVM FOR A RETURN CALL

## 2019-07-25 NOTE — Telephone Encounter (Signed)
Advised pt to call and make appt to be seen for possible ingrown infection

## 2019-07-25 NOTE — Progress Notes (Signed)
[facility] CSW Progress Notes  Enrolled in [employer].  Will receive 7 meals/week delivered by [employer] for next 6 months.  Meaghen Vecchiarelli, LCSW Clinical Social Worker Phone:  [phone] Cell:  [phone] 

## 2019-07-25 NOTE — Telephone Encounter (Signed)
pt will not have transportation on 07/26/19 appt cancelled

## 2019-07-26 ENCOUNTER — Ambulatory Visit (HOSPITAL_COMMUNITY): Payer: 59 | Admitting: Physical Therapy

## 2019-07-26 ENCOUNTER — Encounter: Payer: Self-pay | Admitting: *Deleted

## 2019-07-26 ENCOUNTER — Other Ambulatory Visit: Payer: Self-pay | Admitting: Internal Medicine

## 2019-07-26 DIAGNOSIS — M5137 Other intervertebral disc degeneration, lumbosacral region: Secondary | ICD-10-CM

## 2019-07-26 LAB — HM DIABETES EYE EXAM

## 2019-07-26 MED FILL — BICALUTAMIDE 50 MG TABS: 50 | 90 days supply | Qty: 90 | Fill #1

## 2019-07-26 NOTE — Telephone Encounter (Signed)
Refill Request   HYDROcodone-acetaminophen (NORCO/VICODIN) 5-325 MG tablet  [id number] - [place], [place] [address]

## 2019-07-27 ENCOUNTER — Other Ambulatory Visit: Payer: Self-pay

## 2019-07-27 ENCOUNTER — Encounter (HOSPITAL_COMMUNITY): Payer: Self-pay

## 2019-07-27 ENCOUNTER — Encounter (HOSPITAL_COMMUNITY): Payer: 59

## 2019-07-27 ENCOUNTER — Ambulatory Visit (HOSPITAL_COMMUNITY): Payer: 59 | Attending: Internal Medicine

## 2019-07-27 DIAGNOSIS — R2689 Other abnormalities of gait and mobility: Secondary | ICD-10-CM

## 2019-07-27 DIAGNOSIS — R262 Difficulty in walking, not elsewhere classified: Secondary | ICD-10-CM | POA: Diagnosis present

## 2019-07-27 DIAGNOSIS — M6281 Muscle weakness (generalized): Secondary | ICD-10-CM | POA: Diagnosis present

## 2019-07-27 DIAGNOSIS — R2681 Unsteadiness on feet: Secondary | ICD-10-CM

## 2019-07-27 MED ORDER — HYDROCODONE-ACETAMINOPHEN 5-325 MG PO TABS: 1.0000 | ORAL_TABLET | Freq: Two times a day (BID) | ORAL | 0 refills | Status: DC | PRN

## 2019-07-27 NOTE — Therapy (Signed)
[facility] [place], [place], [address] Phone: [phone]   Fax:  [phone]  Physical Therapy Treatment  Patient Details  Name: Jacob Joseph MRN: [id number] Date of Birth: 26-Oct-1955 Referring Provider (PT): Velna Ochs, MD   Encounter Date: 07/27/2019  PT End of Session - 07/27/19 1316    Visit Number  5    Number of Visits  12    Date for PT Re-Evaluation  08/01/19    Authorization Type  [employer], Secondary: Medicaid    Authorization Time Period  06/20/19 to 07/31/18; complete progress note at visit 10    Authorization - Visit Number  5    Authorization - Number of Visits  10    PT Start Time  1306    PT Stop Time  1345    PT Time Calculation (min)  39 min    Equipment Utilized During Treatment  Gait belt    Activity Tolerance  Patient limited by fatigue;No increased pain    Behavior During Therapy  WFL for tasks assessed/performed       Past Medical History:  Diagnosis Date  . Abnormal computed tomography of thoracic spine 02/26/2018  . AKI (acute kidney injury) ([place]) 09/24/2016  . BPH associated with nocturia 06/19/2016  . Calcium oxalate crystals in urine 12/23/2011   Asymptomatic, no hematuria. Advised to take plenty of water.  . Calculus of gallbladder without cholecystitis without obstruction   . Closed fracture of proximal end of left humerus with delayed healing 05/26/2018  . COPD (chronic obstructive pulmonary disease) ([place])   . Depression   . Diabetes mellitus without complication ([name]) 123456   pre diabetic  . Hepatitis C   . HIV (human immunodeficiency virus infection) ([place])   . Hypertension   . PE (pulmonary thromboembolism) ([place]) 05/12/2016   PE in 2017, on chronic anticoagulation for this and recurrent DVT.  [name] Kitchen Peripheral arterial disease ([place])    ooccluded left SFA by duplex ultrasound, tibial disease left greater than right  . Pressure injury of skin 01/13/2019  . Prostate cancer  metastatic to multiple sites [facility])   . Stroke ([place]) 11/2011   Carotids Doppler negative. Right sided weakness resolved, initially on Plavix for 3 months and then continued with ASA.    . TB lung, latent 1988   Treated    Past Surgical History:  Procedure Laterality Date  . HERNIA REPAIR    . INGUINAL HERNIA REPAIR  01/16/2012   Procedure: HERNIA REPAIR INGUINAL INCARCERATED;  Surgeon: Zenovia Jarred, MD;  Location: [facility];  Service: General;  Laterality: Right;  . TEE WITHOUT CARDIOVERSION  12/07/2011   Procedure: TRANSESOPHAGEAL ECHOCARDIOGRAM (TEE);  Surgeon: Birdie Riddle, MD;  Location: [facility];  Service: Cardiovascular;  Laterality: N/A;    There were no vitals filed for this visit.  Subjective Assessment - 07/27/19 1313    Subjective  Pt has been out of therapy for ~3 weeks.  Returns today, reports he finished radiation on the Thursday before new years. Has been out of therapy due to the radiation and difficulty finding transportation.  Has been compliant with HEP 3-4 days a week.    Pertinent History  Prostate cancer metastatic to multiple sites (LBP), Cancer treatment begins sometime December 2020    Patient Stated Goals  strengthen my legs so that I can stand and walk and stuff    Currently in Pain?  Yes    Pain Score  6  Pain Location  Back    Pain Orientation  Lower    Pain Descriptors / Indicators  Aching    Pain Type  Chronic pain    Pain Onset  More than a month ago    Pain Frequency  Constant    Aggravating Factors   Constant pain secondary to cancer metastases    Pain Relieving Factors  pain medication temporatily    Effect of Pain on Daily Activities  limited                       [facility] Adult PT Treatment/Exercise - 07/27/19 0001      Knee/Hip Exercises: Standing   Heel Raises  10 reps    Functional Squat  2 sets;5 reps    Functional Squat Limitations  front of chair, cueing for mechanics    Other Standing Knee Exercises  Sidestep 2RT  GTB inside //bars (cueing to reduce ER)    Other Standing Knee Exercises  tandem stance 2x 20" (intermittent HHA)      Knee/Hip Exercises: Seated   Long Arc Quad  Both;10 reps    Long Arc Quad Weight  4 lbs.    Other Seated Knee/Hip Exercises  heel and toe raises 20x    Marching  Both;10 reps    Marching Weights  4 lbs.    Sit to Sand  10 reps   3, 3, and 4.; unable to do 10 without rest break per fatigue            PT Education - 07/27/19 1336    Education Details  Reviewed importance of compliance with HEP for strengthening.  Discussed safety with sit to stand, encouraged to feel chair behind legs prior sitting    Person(s) Educated  Patient    Methods  Explanation    Comprehension  Verbalized understanding;Need further instruction       PT Short Term Goals - 06/27/19 1352      PT SHORT TERM GOAL #1   Title  Pt will perform HEP correctly and consistently, at least 2x/week to improve ambulation tolerance and reduce incidence of falls.    Time  3    Period  Weeks    Status  On-going    Target Date  07/11/19      PT SHORT TERM GOAL #2   Title  Pt will perform 30 sec STS at 5 reps with BUE assist and rollator to improve ability to rise from chair and move about house.    Time  3    Period  Weeks    Status  On-going      PT SHORT TERM GOAL #3   Title  Pt will complete 2MWT at 200 feet with LRAD, without seated rest break to demo improve cardiovascular endurance and muscle strength.    Time  3    Period  Weeks    Status  On-going        PT Long Term Goals - 06/27/19 1352      PT LONG TERM GOAL #1   Title  Pt will score 19 on DGI to indicate reduction in risk for falls with mobility around the home and in the community.    Time  6    Period  Weeks    Status  On-going      PT LONG TERM GOAL #2   Title  Pt will perform 30 sec STS at 8 reps with BUE assist and rollator  to improve ability to rise from chair and move about house.    Time  6    Period  Weeks     Status  On-going      PT LONG TERM GOAL #3   Title  Pt will complete 2MWT at 350 feet with LRAD, without seated rest break to demo improve cardiovascular endurance and muscle strength.    Time  6    Period  Weeks    Status  On-going      PT LONG TERM GOAL #4   Title  Pt will self report <10 falls since starting therapy to indicate improved strength and safety with mobility and improve overall quality of life.    Time  6    Period  Weeks    Status  On-going            Plan - 07/27/19 1352    Clinical Impression Statement  Pt out of therapy for ~3 weeks due to radiation and difficulty finding transportation.  Pt presents with increased fatigue, leg weakness and unawareness of safety noted.  Pt strongly encouraged to feel chair on back of legs prior sitting for safety.  Pt continues to fatigue fast with rest breaks required through session.  Unable to complete for than 4 reps of STS prior need to rest.  Added squats for gluteal strengthening with multimodal cueing for proper form.  Reviewed importance of compliance wiht HEP for strengthening.    Personal Factors and Comorbidities  Comorbidity 3+    Comorbidities  Prostate cancer metastatic to multiple sites (LBP), stroke 2013, COPD,    Examination-Activity Limitations  Bed Mobility;Locomotion Level;Stairs;Stand;Transfers    Examination-Participation Restrictions  Cleaning    Stability/Clinical Decision Making  Evolving/Moderate complexity    Clinical Decision Making  Moderate    Rehab Potential  Fair    PT Frequency  2x / week    PT Duration  6 weeks    PT Treatment/Interventions  ADLs/Self Care Home Management;Aquatic Therapy;Cryotherapy;DME Instruction;Gait training;Stair training;Functional mobility training;Therapeutic activities;Therapeutic exercise;Balance training;Neuromuscular re-education;Patient/family education;Orthotic Fit/Training;Manual techniques;Passive range of motion;Dry needling;Energy conservation;Joint Manipulations     PT Next Visit Plan  Progress exercises and balance to standing as able. Low level strengthening of BLE, avoid positions that increase LBP due to prostate cancer metastases. Respect pt's fatigue and pain levels    PT Home Exercise Plan  Eval: seated heel raises, seated toe raises, STS; 12/8: seated abduction with GTB       Patient will benefit from skilled therapeutic intervention in order to improve the following deficits and impairments:  Abnormal gait, Cardiopulmonary status limiting activity, Decreased activity tolerance, Decreased balance, Decreased endurance, Decreased mobility, Decreased strength, Difficulty walking, Pain  Visit Diagnosis: Other abnormalities of gait and mobility  Unsteadiness on feet  Difficulty in walking, not elsewhere classified  Muscle weakness (generalized)     Problem List Patient Active Problem List   Diagnosis Date Noted  . Xerosis of skin 02/26/2019  . History of angioedema 01/10/2019  . Leukocytosis 09/01/2018  . Severe sepsis ([place]) 08/31/2018  . Spine metastasis ([place]) 03/24/2018  . History of DVT (deep vein thrombosis)   . Prostate cancer metastatic to multiple sites [facility])   . Radicular pain of thoracic region   . Bone lesion   . Chronic anticoagulation   . Right upper quadrant pain 02/26/2018  . Recurrent falls 11/02/2016  . History of prostate cancer 09/22/2016  . Substance abuse ([place]) 01/27/2016  . Vitamin D deficiency 12/05/2015  . Hyperlipidemia  01/28/2015  . Cataracts, bilateral 08/29/2014  . Diabetes mellitus treated with oral medication ([place]) 10/19/2013  . COPD, moderate ([place]) 05/31/2013  . Complex Lt Renal Cyst 03/22/2013  . Preventive measure 05/30/2012  . History of CVA 03/08/2012  . Normocytic anemia 03/08/2012  . CKD (chronic kidney disease) stage 3, GFR 30-59 ml/min ([facility]) 03/08/2012  . ERECTILE DYSFUNCTION 04/25/2008  . HIV disease ([place]) 04/27/2006  . Smoking greater than 30 pack years 04/27/2006  . Major depressive  disorder, recurrent episode ([place]) 04/27/2006  . Hypertension 04/27/2006  . DEGENERATIVE JOINT DISEASE 04/27/2006  . Chronic Low Back Pain 04/27/2006   Ihor Austin, LPTA; CBIS [phone]  Aldona Lento 07/27/2019, 1:59 PM  [facility] [address] [place], [place], [address] Phone: [phone]   Fax:  [phone]  Name: Ackley Brixius MRN: [id number] Date of Birth: 1955-11-19

## 2019-07-28 ENCOUNTER — Encounter (HOSPITAL_COMMUNITY): Payer: 59 | Admitting: Physical Therapy

## 2019-07-31 ENCOUNTER — Other Ambulatory Visit: Payer: Self-pay | Admitting: Internal Medicine

## 2019-07-31 DIAGNOSIS — I10 Essential (primary) hypertension: Secondary | ICD-10-CM

## 2019-07-31 DIAGNOSIS — M5416 Radiculopathy, lumbar region: Secondary | ICD-10-CM

## 2019-07-31 NOTE — Telephone Encounter (Signed)
Called [facility] - talked to [place], stated she had called last week for refills on Voltaren gel and HCTZ 12.5 which are not on current med list; also Gabapentin and Dilt-XR.

## 2019-07-31 NOTE — Telephone Encounter (Signed)
[place]

## 2019-08-01 ENCOUNTER — Encounter (HOSPITAL_COMMUNITY): Payer: Self-pay

## 2019-08-01 ENCOUNTER — Ambulatory Visit (HOSPITAL_COMMUNITY): Payer: 59

## 2019-08-01 ENCOUNTER — Other Ambulatory Visit: Payer: Self-pay

## 2019-08-01 ENCOUNTER — Other Ambulatory Visit: Payer: Self-pay | Admitting: Internal Medicine

## 2019-08-01 DIAGNOSIS — M6281 Muscle weakness (generalized): Secondary | ICD-10-CM

## 2019-08-01 DIAGNOSIS — R2681 Unsteadiness on feet: Secondary | ICD-10-CM

## 2019-08-01 DIAGNOSIS — R2689 Other abnormalities of gait and mobility: Secondary | ICD-10-CM | POA: Diagnosis not present

## 2019-08-01 DIAGNOSIS — R262 Difficulty in walking, not elsewhere classified: Secondary | ICD-10-CM

## 2019-08-01 NOTE — Telephone Encounter (Signed)
Refill Request   DILT-XR 120 MG 24 hr capsule   [facility] [place], [place]

## 2019-08-01 NOTE — Therapy (Signed)
[place], [place], [address] Phone: [phone]   Fax:  [phone]   Progress Note Reporting Period 06/19/20 to 07/31/18  See note below for Objective Data and Assessment of Progress/Goals.       Physical Therapy Treatment  Patient Details  Name: Jacob Joseph MRN: [id number] Date of Birth: [birth year] Referring Provider (PT): Velna Ochs, MD   Encounter Date: 08/01/2019  PT End of Session - 08/01/19 1309    Visit Number  6    Number of Visits  14   1/12: added 8 visits   Date for PT Re-Evaluation  08/29/19    Authorization Type  [employer], Secondary: Medicaid    Authorization Time Period  06/20/19 to 07/31/18; NEW 08/01/19 to 08/29/19    Authorization - Visit Number  1    Authorization - Number of Visits  10    PT Start Time  1301    PT Stop Time  1340    PT Time Calculation (min)  39 min    Equipment Utilized During Treatment  Gait belt    Activity Tolerance  Patient limited by fatigue;Patient limited by pain    Behavior During Therapy  WFL for tasks assessed/performed       Past Medical History:  Diagnosis Date  . Abnormal computed tomography of thoracic spine 02/26/2018  . AKI (acute kidney injury) ([place]) 09/24/2016  . BPH associated with nocturia 06/19/2016  . Calcium oxalate crystals in urine 12/23/2011   Asymptomatic, no hematuria. Advised to take plenty of water.  . Calculus of gallbladder without cholecystitis without obstruction   . Closed fracture of proximal end of left humerus with delayed healing 05/26/2018  . COPD (chronic obstructive pulmonary disease) ([place])   . Depression   . Diabetes mellitus without complication ([place]) 123456   pre diabetic  . Hepatitis C   . HIV (human immunodeficiency virus infection) ([place])   . Hypertension   . PE (pulmonary thromboembolism) ([place]) 05/12/2016   PE in 2017, on chronic anticoagulation for this and recurrent DVT.  [name] Kitchen Peripheral arterial disease  ([place])    ooccluded left SFA by duplex ultrasound, tibial disease left greater than right  . Pressure injury of skin 01/13/2019  . Prostate cancer metastatic to multiple sites [facility])   . Stroke ([place]) 11/2011   Carotids Doppler negative. Right sided weakness resolved, initially on Plavix for 3 months and then continued with ASA.    . TB lung, latent 1988   Treated    Past Surgical History:  Procedure Laterality Date  . HERNIA REPAIR    . INGUINAL HERNIA REPAIR  01/16/2012   Procedure: HERNIA REPAIR INGUINAL INCARCERATED;  Surgeon: Zenovia Jarred, MD;  Location: [place];  Service: General;  Laterality: Right;  . TEE WITHOUT CARDIOVERSION  12/07/2011   Procedure: TRANSESOPHAGEAL ECHOCARDIOGRAM (TEE);  Surgeon: Birdie Riddle, MD;  Location: [facility];  Service: Cardiovascular;  Laterality: N/A;    There were no vitals filed for this visit.  Subjective Assessment - 08/01/19 1305    Subjective  Pt reports he finished radiation and is slowly getting his energy back. Pt reports things "are a lot better" since starting. Pt reports 2 falls to the floor since starting therapy, able to crawl to furniture to assist in hoisting self up.    Pertinent History  Prostate cancer metastatic to multiple sites (LBP), Cancer treatment begins sometime December 2020    How long can you sit comfortably?  no  issues    How long can you stand comfortably?  <10 minutes    How long can you walk comfortably?  <10 minutes    Diagnostic tests  06/05/19 MRI: bone metastases increase since 02/27/18, L4-5 disc degeneration, R foraminal stenosis L3-4    Patient Stated Goals  strengthen my legs so that I can stand and walk and stuff    Currently in Pain?  Yes    Pain Score  5     Pain Location  Back    Pain Orientation  Lower    Pain Descriptors / Indicators  Aching    Pain Onset  More than a month ago    Pain Frequency  Constant    Aggravating Factors   Constant pain secondary to cancer metastases, but improving daily  since radiation    Pain Relieving Factors  pain medication temporarily    Effect of Pain on Daily Activities  limited         [facility] PT Assessment - 08/01/19 0001      Assessment   Medical Diagnosis  Recurrent falls    Referring Provider (PT)  Velna Ochs, MD    Onset Date/Surgical Date  --   approximately July 2020   Next MD Visit  August 17, 2019    Prior Therapy  Yes, helped with balance deficits prior      Precautions   Precautions  Fall      Restrictions   Weight Bearing Restrictions  No      Balance Screen   Has the patient fallen in the past 6 months  Yes    How many times?  2 since starting therapy   was 1x/day   Has the patient had a decrease in activity level because of a fear of falling?   No    Is the patient reluctant to leave their home because of a fear of falling?   No      Prior Function   Level of Independence  Requires assistive device for independence    Leisure  Watching movies, grandchildren      Cognition   Overall Cognitive Status  Within Functional Limits for tasks assessed      Observation/Other Assessments   Focus on Therapeutic Outcomes (FOTO)   n/a      Sensation   Light Touch  Appears Intact      Functional Tests   Functional tests  Sit to Stand      Sit to Stand   Comments  30 sec STS: 5 reps, UE assist on thighs/armrests, rocking momentum, denies pain   was 3 reps, UE assist on thighs, rocking momentum     Strength   Overall Strength Comments  unable to tolerate prone position,    Right Hip Flexion  4+/5   was 3+   Right Hip Extension  3/5   functionally assessed; was 3/5   Right Hip ABduction  3/5   functionally assessed; was 3/5   Left Hip Flexion  4+/5   was 3+   Left Hip Extension  3/5   functionally assessed; was 3/5   Left Hip ABduction  3/5   functionally assessed; was 3/5   Right Knee Flexion  3+/5   functionally assessed; was 3/5   Right Knee Extension  4+/5   was 4+   Left Knee Flexion  3+/5    functionally assessed; was 3/5   Left Knee Extension  4+/5   was 4+   Right Ankle  Dorsiflexion  4+/5   was 4+   Left Ankle Dorsiflexion  4+/5   was 4+     Palpation   Palpation comment  Pt reports tenderness throughout lumbar spinal processes and mulsculature with palpation secondary to bone metastases   same as before     Ambulation/Gait   Ambulation/Gait  Yes    Ambulation Distance (Feet)  114 Feet   was 112 ft   Assistive device  Straight cane    Gait Pattern  Step-to pattern;Trunk flexed;Decreased step length - right;Decreased step length - left;Decreased stride length;Decreased hip/knee flexion - right;Decreased hip/knee flexion - left;Decreased dorsiflexion - right;Decreased dorsiflexion - left    Gait Comments  2MWT, continues to require seated rest break after 1 min 20 sec due to SOB, no increase in LBP      Standardized Balance Assessment   Standardized Balance Assessment  Timed Up and Go Test;Dynamic Gait Index      Dynamic Gait Index   Level Surface  Mild Impairment    Change in Gait Speed  Moderate Impairment    Gait with Horizontal Head Turns  Mild Impairment    Gait with Vertical Head Turns  Mild Impairment    Gait and Pivot Turn  Mild Impairment    Step Over Obstacle  Mild Impairment    Step Around Obstacles  Mild Impairment    Steps  Moderate Impairment    Total Score  14    DGI comment:  no AD, 1 seated rest break due to fatigue   was 12 with 2 seated rest breaks     Timed Up and Go Test   TUG  Normal TUG    Normal TUG (seconds)  21   was 24 sec   TUG Comments  without AD, SUPV              PT Education - 08/01/19 1425    Education Details  Reassessment fingings, HEP review and progressing exercises within pt tolerance due to pain and SOB, exercise techniques, extending POC    Person(s) Educated  Patient    Methods  Explanation;Demonstration    Comprehension  Verbalized understanding       PT Short Term Goals - 08/01/19 1309      PT  SHORT TERM GOAL #1   Title  Pt will perform HEP correctly and consistently, at least 2x/week to improve ambulation tolerance and reduce incidence of falls.    Baseline  1/12: pt reports performing exercises 1x/day    Time  3    Period  Weeks    Status  Achieved    Target Date  07/11/19      PT SHORT TERM GOAL #2   Title  Pt will perform 30 sec STS at 5 reps with BUE assist and rollator to improve ability to rise from chair and move about house.    Baseline  1/12: 5 reps, BUE assist, rocking momentum, no increased LBP    Time  3    Period  Weeks    Status  Achieved      PT SHORT TERM GOAL #3   Title  Pt will complete 2MWT at 200 feet with LRAD, without seated rest break to demo improve cardiovascular endurance and muscle strength.    Baseline  1/12: 142ft with SPC, seated rest at 1 min 20 sec    Time  3    Period  Weeks    Status  On-going  PT Long Term Goals - 08/01/19 1310      PT LONG TERM GOAL #1   Title  Pt will score 19 on DGI to indicate reduction in risk for falls with mobility around the home and in the community.    Baseline  1/12: 14 onDGI    Time  6    Period  Weeks    Status  On-going      PT LONG TERM GOAL #2   Title  Pt will perform 30 sec STS at 8 reps with BUE assist and rollator to improve ability to rise from chair and move about house.    Baseline  1/12: 5 reps, BUE assist, rocking momentum, no increased LBP    Time  6    Period  Weeks    Status  On-going      PT LONG TERM GOAL #3   Title  Pt will complete 2MWT at 350 feet with LRAD, without seated rest break to demo improve cardiovascular endurance and muscle strength.    Baseline  1/12: 171ft with SPC, seated rest at 1 min 20 sec    Time  6    Period  Weeks    Status  On-going      PT LONG TERM GOAL #4   Title  Pt will self report <10 falls since starting therapy to indicate improved strength and safety with mobility and improve overall quality of life.    Baseline  1/12: pt reports 2 falls  since starting therapy    Time  6    Period  Weeks    Status  Achieved            Plan - 08/01/19 1343    Clinical Impression Statement  Pt at end of POC this session, reviewed goals and discussed limitations with initial POC. Pt with some improvements in functional strength, able to perform 5 STS in 30 seconds versus 3 reps, continues to require BUE assist to power up. Pt with improvement in DGI to 14 and TUG to 21 sec both without AD, but continues to be high fall risk. Pt limited by radiation treatments and continues to fatigue easily requiring slow strengthening progression and frequent therapeutic rest breaks. Unable to fully assess pt's strength due to inability to tolerate prone positioning for proper strength testing. Pt with improvements, but would benefit from additional 4 week POC to continue progressing strength, balance, activity tolerance, and reduce overall risk for falls and improve balance with functional activity.    Personal Factors and Comorbidities  Comorbidity 3+    Comorbidities  Prostate cancer metastatic to multiple sites (LBP), stroke 2013, COPD,    Examination-Activity Limitations  Bed Mobility;Locomotion Level;Stairs;Stand;Transfers    Examination-Participation Restrictions  Cleaning    Stability/Clinical Decision Making  Evolving/Moderate complexity    Rehab Potential  Fair    PT Frequency  2x / week    PT Duration  4 weeks    PT Treatment/Interventions  ADLs/Self Care Home Management;Aquatic Therapy;Cryotherapy;DME Instruction;Gait training;Stair training;Functional mobility training;Therapeutic activities;Therapeutic exercise;Balance training;Neuromuscular re-education;Patient/family education;Orthotic Fit/Training;Manual techniques;Passive range of motion;Dry needling;Energy conservation;Joint Manipulations    PT Next Visit Plan  Continue strengthening of BLE and balance challenges; avoid positions that increase LBP due to prostate cancer metastases. Respect  pt's fatigue and pain levels    PT Home Exercise Plan  Eval: seated heel raises, seated toe raises, STS; 12/8: seated abduction with GTB; 1/12: STS    Consulted and Agree with Plan of Care  Patient  Patient will benefit from skilled therapeutic intervention in order to improve the following deficits and impairments:  Abnormal gait, Cardiopulmonary status limiting activity, Decreased activity tolerance, Decreased balance, Decreased endurance, Decreased mobility, Decreased strength, Difficulty walking, Pain  Visit Diagnosis: Difficulty in walking, not elsewhere classified  Muscle weakness (generalized)  Other abnormalities of gait and mobility  Unsteadiness on feet     Problem List Patient Active Problem List   Diagnosis Date Noted  . Xerosis of skin 02/26/2019  . History of angioedema 01/10/2019  . Leukocytosis 09/01/2018  . Severe sepsis ([place]) 08/31/2018  . Spine metastasis ([place]) 03/24/2018  . History of DVT (deep vein thrombosis)   . Prostate cancer metastatic to multiple sites [facility])   . Radicular pain of thoracic region   . Bone lesion   . Chronic anticoagulation   . Right upper quadrant pain 02/26/2018  . Recurrent falls 11/02/2016  . History of prostate cancer 09/22/2016  . Substance abuse ([place]) 01/27/2016  . Vitamin D deficiency 12/05/2015  . Hyperlipidemia 01/28/2015  . Cataracts, bilateral 08/29/2014  . Diabetes mellitus treated with oral medication ([place]) 10/19/2013  . COPD, moderate ([place]) 05/31/2013  . Complex Lt Renal Cyst 03/22/2013  . Preventive measure 05/30/2012  . History of CVA 03/08/2012  . Normocytic anemia 03/08/2012  . CKD (chronic kidney disease) stage 3, GFR 30-59 ml/min ([facility]) 03/08/2012  . ERECTILE DYSFUNCTION 04/25/2008  . HIV disease ([place]) 04/27/2006  . Smoking greater than 30 pack years 04/27/2006  . Major depressive disorder, recurrent episode ([place]) 04/27/2006  . Hypertension 04/27/2006  . DEGENERATIVE JOINT DISEASE 04/27/2006  .  Chronic Low Back Pain 04/27/2006    Talbot Grumbling PT, DPT 08/01/19, 2:28 PM [place] [address] [place], [place], [address] Phone: [phone]   Fax:  [phone]  Name: Oatis Kinkade MRN: [id number] Date of Birth: 25-May-1956

## 2019-08-02 ENCOUNTER — Other Ambulatory Visit: Payer: Self-pay | Admitting: Radiation Oncology

## 2019-08-02 DIAGNOSIS — C7951 Secondary malignant neoplasm of bone: Secondary | ICD-10-CM

## 2019-08-03 ENCOUNTER — Telehealth: Payer: Self-pay | Admitting: Radiation Oncology

## 2019-08-03 ENCOUNTER — Other Ambulatory Visit: Payer: Self-pay

## 2019-08-03 ENCOUNTER — Ambulatory Visit (HOSPITAL_COMMUNITY): Payer: 59

## 2019-08-03 DIAGNOSIS — R2681 Unsteadiness on feet: Secondary | ICD-10-CM

## 2019-08-03 DIAGNOSIS — R2689 Other abnormalities of gait and mobility: Secondary | ICD-10-CM | POA: Diagnosis not present

## 2019-08-03 DIAGNOSIS — R262 Difficulty in walking, not elsewhere classified: Secondary | ICD-10-CM

## 2019-08-03 DIAGNOSIS — M6281 Muscle weakness (generalized): Secondary | ICD-10-CM

## 2019-08-03 MED ORDER — GABAPENTIN 300 MG PO CAPS: 600.0000 mg | ORAL_CAPSULE | Freq: Two times a day (BID) | ORAL | 2 refills | Status: DC

## 2019-08-03 MED ORDER — DICLOFENAC SODIUM 1 % EX GEL: 2.0000 g | Freq: Four times a day (QID) | CUTANEOUS | 1 refills | Status: DC | PRN

## 2019-08-03 MED ORDER — HYDROCHLOROTHIAZIDE 12.5 MG PO TABS: 12.5000 mg | ORAL_TABLET | Freq: Every day | ORAL | 3 refills | Status: DC

## 2019-08-03 MED ORDER — DILTIAZEM HCL ER 120 MG PO CP24: ORAL_CAPSULE | ORAL | 0 refills | Status: DC

## 2019-08-03 NOTE — Assessment & Plan Note (Signed)
Refill requested for HCTZ by phone. Review of chart shows that after starting HCTZ in November, he saw has Oncologist on on 07/19/2019 and his BP that visit was improved to 0000000 systolic and his lab work done at their office showed stable renal function. Will continue HCTZ in addition to his other chronic medications. - Metoprolol 50mg  BID - Diltiazem 120mg  Daily - Refill HCTZ 12.5mg  Daily

## 2019-08-03 NOTE — Therapy (Signed)
Jacob Joseph, [place], [address] Phone: [phone]   Fax:  [phone]  Physical Therapy Treatment  Patient Details  Name: Jacob Joseph MRN: [id number] Date of Birth: 02-Sep-1955 Referring Provider (PT): Velna Ochs, MD   Encounter Date: 08/03/2019  PT End of Session - 08/03/19 1451    Visit Number  7    Number of Visits  14   1/12: added 8 visits   Date for PT Re-Evaluation  08/29/19    Authorization Type  [employer], Secondary: Medicaid    Authorization Time Period  06/20/19 to 07/31/18; NEW 08/01/19 to 08/29/19    Authorization - Visit Number  2    Authorization - Number of Visits  10    PT Start Time  1406    PT Stop Time  1444    PT Time Calculation (min)  38 min    Equipment Utilized During Treatment  Gait belt    Activity Tolerance  Patient limited by fatigue;Patient limited by pain;Patient tolerated treatment well    Behavior During Therapy  [facility] for tasks assessed/performed       Past Medical History:  Diagnosis Date  . Abnormal computed tomography of thoracic spine 02/26/2018  . AKI (acute kidney injury) ([place]) 09/24/2016  . BPH associated with nocturia 06/19/2016  . Calcium oxalate crystals in urine 12/23/2011   Asymptomatic, no hematuria. Advised to take plenty of water.  . Calculus of gallbladder without cholecystitis without obstruction   . Closed fracture of proximal end of left humerus with delayed healing 05/26/2018  . COPD (chronic obstructive pulmonary disease) ([place])   . Depression   . Diabetes mellitus without complication ([place]) 123456   pre diabetic  . Hepatitis C   . HIV (human immunodeficiency virus infection) ([place])   . Hypertension   . PE (pulmonary thromboembolism) ([place]) 05/12/2016   PE in 2017, on chronic anticoagulation for this and recurrent DVT.  [name] Kitchen Peripheral arterial disease ([place])    ooccluded left SFA by duplex ultrasound, tibial disease left greater than right  . Pressure  injury of skin 01/13/2019  . Prostate cancer metastatic to multiple sites [facility])   . Stroke ([place]) 11/2011   Carotids Doppler negative. Right sided weakness resolved, initially on Plavix for 3 months and then continued with ASA.    . TB lung, latent 1988   Treated    Past Surgical History:  Procedure Laterality Date  . HERNIA REPAIR    . INGUINAL HERNIA REPAIR  01/16/2012   Procedure: HERNIA REPAIR INGUINAL INCARCERATED;  Surgeon: Zenovia Jarred, MD;  Location: [place];  Service: General;  Laterality: Right;  . TEE WITHOUT CARDIOVERSION  12/07/2011   Procedure: TRANSESOPHAGEAL ECHOCARDIOGRAM (TEE);  Surgeon: Birdie Riddle, MD;  Location: [place];  Service: Cardiovascular;  Laterality: N/A;    There were no vitals filed for this visit.  Subjective Assessment - 08/03/19 1407    Subjective  Pt stated he feels he's feeling good today, feels he is making improvements with therapy.  Current pain scale 2-3/10 constant LBP achey pain .    Pertinent History  Prostate cancer metastatic to multiple sites (LBP), Cancer treatment begins sometime December 2020    Patient Stated Goals  strengthen my legs so that I can stand and walk and stuff    Currently in Pain?  Yes    Pain Score  3     Pain Location  Back    Pain Orientation  Lower;Mid  Pain Descriptors / Indicators  Aching    Pain Type  Chronic pain    Pain Frequency  Constant                       [facility] Adult PT Treatment/Exercise - 08/03/19 0001      Knee/Hip Exercises: Standing   Forward Step Up  Both;2 sets;5 reps;Hand Hold: 2;Hand Hold: 1;Step Height: 4";Step Height: 6"    Other Standing Knee Exercises  Sidestep 4RT GTB inside //bars (cueing to reduce ER)    Other Standing Knee Exercises  tandem stance 2x 20" (intermittent HHA)      Knee/Hip Exercises: Seated   Long Arc Quad  Both;10 reps    Long Arc Quad Weight  4 lbs.    Marching  Both;10 reps    Marching Weights  4 lbs.    Sit to Sand  2 sets;5 reps;without  UE support   increased assistance required 2nd set due to fatiuge     Modalities   Modalities  Moist Heat      Moist Heat Therapy   Number Minutes Moist Heat  12 Minutes    Moist Heat Location  Lumbar Spine   during seated therex for LBP              PT Short Term Goals - 08/01/19 1309      PT SHORT TERM GOAL #1   Title  Pt will perform HEP correctly and consistently, at least 2x/week to improve ambulation tolerance and reduce incidence of falls.    Baseline  1/12: pt reports performing exercises 1x/day    Time  3    Period  Weeks    Status  Achieved    Target Date  07/11/19      PT SHORT TERM GOAL #2   Title  Pt will perform 30 sec STS at 5 reps with BUE assist and rollator to improve ability to rise from chair and move about house.    Baseline  1/12: 5 reps, BUE assist, rocking momentum, no increased LBP    Time  3    Period  Weeks    Status  Achieved      PT SHORT TERM GOAL #3   Title  Pt will complete 2MWT at 200 feet with LRAD, without seated rest break to demo improve cardiovascular endurance and muscle strength.    Baseline  1/12: 140ft with SPC, seated rest at 1 min 20 sec    Time  3    Period  Weeks    Status  On-going        PT Long Term Goals - 08/01/19 1310      PT LONG TERM GOAL #1   Title  Pt will score 19 on DGI to indicate reduction in risk for falls with mobility around the home and in the community.    Baseline  1/12: 14 onDGI    Time  6    Period  Weeks    Status  On-going      PT LONG TERM GOAL #2   Title  Pt will perform 30 sec STS at 8 reps with BUE assist and rollator to improve ability to rise from chair and move about house.    Baseline  1/12: 5 reps, BUE assist, rocking momentum, no increased LBP    Time  6    Period  Weeks    Status  On-going      PT LONG TERM  GOAL #3   Title  Pt will complete 2MWT at 350 feet with LRAD, without seated rest break to demo improve cardiovascular endurance and muscle strength.    Baseline   1/12: 13ft with SPC, seated rest at 1 min 20 sec    Time  6    Period  Weeks    Status  On-going      PT LONG TERM GOAL #4   Title  Pt will self report <10 falls since starting therapy to indicate improved strength and safety with mobility and improve overall quality of life.    Baseline  1/12: pt reports 2 falls since starting therapy    Time  6    Period  Weeks    Status  Achieved            Plan - 08/03/19 1452    Clinical Impression Statement  Session focus on LE strengthening and balance training.  Noted pt has tendency to stand with knee bent, added functional step up training for quad strengthening.  Pt presents with decreased awareness for safety concerns with not looking prior sitting down.  Pt educated importance of feeling chair on posterior leg prior sitting for fall prevention.  Pt continues to fatigue quickly requiring rest breaks.  Pt did reports LBP during session, added MHP during seated therex wiht reports of relief.    Personal Factors and Comorbidities  Comorbidity 3+    Comorbidities  Prostate cancer metastatic to multiple sites (LBP), stroke 2013, COPD,    Examination-Activity Limitations  Bed Mobility;Locomotion Level;Stairs;Stand;Transfers    Examination-Participation Restrictions  Cleaning    Stability/Clinical Decision Making  Evolving/Moderate complexity    Clinical Decision Making  Moderate    Rehab Potential  Fair    PT Frequency  2x / week    PT Duration  4 weeks    PT Treatment/Interventions  ADLs/Self Care Home Management;Aquatic Therapy;Cryotherapy;DME Instruction;Gait training;Stair training;Functional mobility training;Therapeutic activities;Therapeutic exercise;Balance training;Neuromuscular re-education;Patient/family education;Orthotic Fit/Training;Manual techniques;Passive range of motion;Dry needling;Energy conservation;Joint Manipulations    PT Next Visit Plan  Continue strengthening of BLE and balance challenges; avoid positions that increase  LBP due to prostate cancer metastases. Respect pt's fatigue and pain levels    PT Home Exercise Plan  Eval: seated heel raises, seated toe raises, STS; 12/8: seated abduction with GTB; 1/12: STS       Patient will benefit from skilled therapeutic intervention in order to improve the following deficits and impairments:  Abnormal gait, Cardiopulmonary status limiting activity, Decreased activity tolerance, Decreased balance, Decreased endurance, Decreased mobility, Decreased strength, Difficulty walking, Pain  Visit Diagnosis: Muscle weakness (generalized)  Other abnormalities of gait and mobility  Unsteadiness on feet  Difficulty in walking, not elsewhere classified     Problem List Patient Active Problem List   Diagnosis Date Noted  . Xerosis of skin 02/26/2019  . History of angioedema 01/10/2019  . Leukocytosis 09/01/2018  . Severe sepsis ([place]) 08/31/2018  . Spine metastasis ([place]) 03/24/2018  . History of DVT (deep vein thrombosis)   . Prostate cancer metastatic to multiple sites [facility])   . Radicular pain of thoracic region   . Bone lesion   . Chronic anticoagulation   . Right upper quadrant pain 02/26/2018  . Recurrent falls 11/02/2016  . History of prostate cancer 09/22/2016  . Substance abuse ([place]) 01/27/2016  . Vitamin D deficiency 12/05/2015  . Hyperlipidemia 01/28/2015  . Cataracts, bilateral 08/29/2014  . Diabetes mellitus treated with oral medication ([place]) 10/19/2013  . COPD, moderate (  [place]) 05/31/2013  . Complex Lt Renal Cyst 03/22/2013  . Preventive measure 05/30/2012  . History of CVA 03/08/2012  . Normocytic anemia 03/08/2012  . CKD (chronic kidney disease) stage 3, GFR 30-59 ml/min ([facility]) 03/08/2012  . ERECTILE DYSFUNCTION 04/25/2008  . HIV disease ([place]) 04/27/2006  . Smoking greater than 30 pack years 04/27/2006  . Major depressive disorder, recurrent episode ([place]) 04/27/2006  . Hypertension 04/27/2006  . DEGENERATIVE JOINT DISEASE 04/27/2006  . Chronic  Low Back Pain 04/27/2006   Ihor Austin, LPTA; CBIS [phone]  Aldona Lento 08/03/2019, 2:57 PM  [place], [place], [address] Phone: [phone]   Fax:  [phone]  Name: Sayid Morenz MRN: [id number] Date of Birth: Apr 28, 1956

## 2019-08-03 NOTE — Telephone Encounter (Signed)
Received message from Jacob Joseph that the patient request a return call to discuss loss of appetite. Patient with advanced prostate cancer having received initial xofigo injection. Patient reports since the injection he hasn't had much of an appetite. Patient reports he occasionally feels nauseated when others fry foods in his presence but denies vomiting. Patient denies taste changes. Patient state, "I simply don't have any desire to eat." Patient denies trying ensure, glucerna, etc explaining he would need financial assistance or a prescription to obtain these. Patient reports weighing 217 lb today. Patient's weight one month ago noted to be 223 lb. Explained that advance cancer alone can reduce appetite. Explained the importance of nutritious foods and water to maintain strength to be able to seek treatment and progress in PT. Explained this RN would make a referral to the our nutritionist. Patient verbalized understanding of all discussed and expressed appreciation for the return call.

## 2019-08-03 NOTE — Telephone Encounter (Signed)
Refills Approved. Will also refill HCTZ as this was prescribed at last visit and he had lab work done at his oncologist's office at least 1 month later, which showed stable renal function. Will also provide refill of Voltaren gel to be used as needed.

## 2019-08-07 ENCOUNTER — Telehealth: Payer: Self-pay | Admitting: *Deleted

## 2019-08-07 ENCOUNTER — Telehealth (HOSPITAL_COMMUNITY): Payer: Self-pay

## 2019-08-07 NOTE — Telephone Encounter (Signed)
pt called to cancel this appt due to he has an appt at the cancer ctr same day

## 2019-08-07 NOTE — Telephone Encounter (Signed)
CALLED PATIENT TO REMIND OF LAB AND WEIGHT FOR 08-08-19 @ 12 PM, SPOKE WITH PATIENT AND HE IS AWARE OF THIS APPT.

## 2019-08-08 ENCOUNTER — Other Ambulatory Visit: Payer: Self-pay

## 2019-08-08 ENCOUNTER — Ambulatory Visit
Admission: RE | Admit: 2019-08-08 | Discharge: 2019-08-08 | Disposition: A | Discharge status: Home / Self Care | Payer: 59 | Source: Ambulatory Visit | Attending: Radiation Oncology | Admitting: Radiation Oncology

## 2019-08-08 ENCOUNTER — Encounter (HOSPITAL_COMMUNITY): Payer: 59

## 2019-08-08 DIAGNOSIS — C61 Malignant neoplasm of prostate: Secondary | ICD-10-CM | POA: Diagnosis not present

## 2019-08-08 DIAGNOSIS — C7952 Secondary malignant neoplasm of bone marrow: Secondary | ICD-10-CM | POA: Diagnosis present

## 2019-08-08 DIAGNOSIS — Z510 Encounter for antineoplastic radiation therapy: Secondary | ICD-10-CM | POA: Insufficient documentation

## 2019-08-08 DIAGNOSIS — C7951 Secondary malignant neoplasm of bone: Secondary | ICD-10-CM

## 2019-08-08 LAB — CBC WITH DIFFERENTIAL (CANCER CENTER ONLY)
Abs Immature Granulocytes: 0.05 10*3/uL (ref 0.00–0.07)
Basophils Absolute: 0 10*3/uL (ref 0.0–0.1)
Basophils Relative: 0 %
Eosinophils Absolute: 0.2 10*3/uL (ref 0.0–0.5)
Eosinophils Relative: 2 %
HCT: 32.6 % — ABNORMAL LOW (ref 39.0–52.0)
Hemoglobin: 10.2 g/dL — ABNORMAL LOW (ref 13.0–17.0)
Immature Granulocytes: 1 %
Lymphocytes Relative: 14 %
Lymphs Abs: 1.2 10*3/uL (ref 0.7–4.0)
MCH: 24.9 pg — ABNORMAL LOW (ref 26.0–34.0)
MCHC: 31.3 g/dL (ref 30.0–36.0)
MCV: 79.5 fL — ABNORMAL LOW (ref 80.0–100.0)
Monocytes Absolute: 0.7 10*3/uL (ref 0.1–1.0)
Monocytes Relative: 8 %
Neutro Abs: 6.3 10*3/uL (ref 1.7–7.7)
Neutrophils Relative %: 75 %
Platelet Count: 232 10*3/uL (ref 150–400)
RBC: 4.1 MIL/uL — ABNORMAL LOW (ref 4.22–5.81)
RDW: 18.7 % — ABNORMAL HIGH (ref 11.5–15.5)
WBC Count: 8.4 10*3/uL (ref 4.0–10.5)
nRBC: 0 % (ref 0.0–0.2)

## 2019-08-09 ENCOUNTER — Encounter: Payer: 59 | Admitting: Nutrition

## 2019-08-10 ENCOUNTER — Ambulatory Visit (HOSPITAL_COMMUNITY): Payer: 59 | Admitting: Physical Therapy

## 2019-08-10 ENCOUNTER — Telehealth (HOSPITAL_COMMUNITY): Payer: Self-pay

## 2019-08-10 ENCOUNTER — Other Ambulatory Visit: Payer: Self-pay

## 2019-08-10 DIAGNOSIS — R2689 Other abnormalities of gait and mobility: Secondary | ICD-10-CM | POA: Diagnosis not present

## 2019-08-10 DIAGNOSIS — R262 Difficulty in walking, not elsewhere classified: Secondary | ICD-10-CM

## 2019-08-10 DIAGNOSIS — R2681 Unsteadiness on feet: Secondary | ICD-10-CM

## 2019-08-10 DIAGNOSIS — M6281 Muscle weakness (generalized): Secondary | ICD-10-CM

## 2019-08-10 NOTE — Telephone Encounter (Signed)
pt has another appt at the cancer ctr

## 2019-08-10 NOTE — Therapy (Signed)
[place], [place], [address] Phone: [phone]   Fax:  [phone]  Physical Therapy Treatment  Patient Details  Name: Jacob Joseph MRN: [id number] Date of Birth: July 21, 1955 Referring Provider (PT): Velna Ochs, MD   Encounter Date: 08/10/2019  PT End of Session - 08/10/19 1404    Visit Number  8    Number of Visits  14   1/12: added 8 visits   Date for PT Re-Evaluation  08/29/19    Authorization Type  [employer], Secondary: Medicaid    Authorization Time Period  06/20/19 to 07/31/18; NEW 08/01/19 to 08/29/19    Authorization - Visit Number  3    Authorization - Number of Visits  10    PT Start Time  1320    PT Stop Time  1405    PT Time Calculation (min)  45 min    Equipment Utilized During Treatment  Gait belt    Activity Tolerance  Patient limited by fatigue;Patient limited by pain;Patient tolerated treatment well    Behavior During Therapy  [facility] for tasks assessed/performed       Past Medical History:  Diagnosis Date  . Abnormal computed tomography of thoracic spine 02/26/2018  . AKI (acute kidney injury) ([place]) 09/24/2016  . BPH associated with nocturia 06/19/2016  . Calcium oxalate crystals in urine 12/23/2011   Asymptomatic, no hematuria. Advised to take plenty of water.  . Calculus of gallbladder without cholecystitis without obstruction   . Closed fracture of proximal end of left humerus with delayed healing 05/26/2018  . COPD (chronic obstructive pulmonary disease) ([place])   . Depression   . Diabetes mellitus without complication ([place]) 123456   pre diabetic  . Hepatitis C   . HIV (human immunodeficiency virus infection) ([place])   . Hypertension   . PE (pulmonary thromboembolism) ([place]) 05/12/2016   PE in 2017, on chronic anticoagulation for this and recurrent DVT.  [name] Kitchen Peripheral arterial disease ([place])    ooccluded left SFA by duplex ultrasound, tibial disease left greater than right  . Pressure  injury of skin 01/13/2019  . Prostate cancer metastatic to multiple sites [facility])   . Stroke ([place]) 11/2011   Carotids Doppler negative. Right sided weakness resolved, initially on Plavix for 3 months and then continued with ASA.    . TB lung, latent 1988   Treated    Past Surgical History:  Procedure Laterality Date  . HERNIA REPAIR    . INGUINAL HERNIA REPAIR  01/16/2012   Procedure: HERNIA REPAIR INGUINAL INCARCERATED;  Surgeon: Zenovia Jarred, MD;  Location: [place];  Service: General;  Laterality: Right;  . TEE WITHOUT CARDIOVERSION  12/07/2011   Procedure: TRANSESOPHAGEAL ECHOCARDIOGRAM (TEE);  Surgeon: Birdie Riddle, MD;  Location: [place];  Service: Cardiovascular;  Laterality: N/A;    There were no vitals filed for this visit.  Subjective Assessment - 08/10/19 1329    Subjective  pt states his pain is high today in Lower back 8/10.  Noted fatigue and shortness of breath today.    Currently in Pain?  Yes    Pain Score  8     Pain Location  Back                       [facility] Adult PT Treatment/Exercise - 08/10/19 0001      Knee/Hip Exercises: Standing   Heel Raises  15 reps    Hip Abduction  Both;10 reps  Hip Extension  Both;10 reps    Other Standing Knee Exercises  Sidestep 1RT (15 ft one way) no band with therapist in front for cues in hallway    Other Standing Knee Exercises  tandem stance 2x 20" (intermittent HHA)      Knee/Hip Exercises: Seated   Long Arc Quad  Both;10 reps;2 sets    Long Arc Quad Weight  4 lbs.    Marching  Both;10 reps;2 sets    Marching Weights  4 lbs.    Sit to Sand  2 sets;5 reps;without UE support      Knee/Hip Exercises: Supine   Bridges  Both;2 sets;10 reps    Straight Leg Raises  1 set;10 reps             PT Education - 08/10/19 1330    Education Details  safety wtih returning to sit (tends to reach and slide into chair) and also to control descent as tends to plop into chair.    Person(s) Educated  Patient     Methods  Explanation;Demonstration    Comprehension  Verbalized understanding;Returned demonstration       PT Short Term Goals - 08/01/19 1309      PT SHORT TERM GOAL #1   Title  Pt will perform HEP correctly and consistently, at least 2x/week to improve ambulation tolerance and reduce incidence of falls.    Baseline  1/12: pt reports performing exercises 1x/day    Time  3    Period  Weeks    Status  Achieved    Target Date  07/11/19      PT SHORT TERM GOAL #2   Title  Pt will perform 30 sec STS at 5 reps with BUE assist and rollator to improve ability to rise from chair and move about house.    Baseline  1/12: 5 reps, BUE assist, rocking momentum, no increased LBP    Time  3    Period  Weeks    Status  Achieved      PT SHORT TERM GOAL #3   Title  Pt will complete 2MWT at 200 feet with LRAD, without seated rest break to demo improve cardiovascular endurance and muscle strength.    Baseline  1/12: 198ft with SPC, seated rest at 1 min 20 sec    Time  3    Period  Weeks    Status  On-going        PT Long Term Goals - 08/01/19 1310      PT LONG TERM GOAL #1   Title  Pt will score 19 on DGI to indicate reduction in risk for falls with mobility around the home and in the community.    Baseline  1/12: 14 onDGI    Time  6    Period  Weeks    Status  On-going      PT LONG TERM GOAL #2   Title  Pt will perform 30 sec STS at 8 reps with BUE assist and rollator to improve ability to rise from chair and move about house.    Baseline  1/12: 5 reps, BUE assist, rocking momentum, no increased LBP    Time  6    Period  Weeks    Status  On-going      PT LONG TERM GOAL #3   Title  Pt will complete 2MWT at 350 feet with LRAD, without seated rest break to demo improve cardiovascular endurance and muscle strength.  Baseline  1/12: 125ft with SPC, seated rest at 1 min 20 sec    Time  6    Period  Weeks    Status  On-going      PT LONG TERM GOAL #4   Title  Pt will self report  <10 falls since starting therapy to indicate improved strength and safety with mobility and improve overall quality of life.    Baseline  1/12: pt reports 2 falls since starting therapy    Time  6    Period  Weeks    Status  Achieved            Plan - 08/10/19 1419    Clinical Impression Statement  Pt with more fatigue an SOB as compared to last session.  Requried rest break after each set of exercise.  Pt with poor safety awareness, reaching and slifding into chair out of BOS and "plopping" into chair.  Pt required cues to do this correctly.  Noted diffiuclty completing last set of Sit to stands due to fatigue and diffiuclty keeping legs extended during all tasks..  Pt did not report any changes in symptoms at EOS and did not complain of pain during session.    Personal Factors and Comorbidities  Comorbidity 3+    Comorbidities  Prostate cancer metastatic to multiple sites (LBP), stroke 2013, COPD,    Examination-Activity Limitations  Bed Mobility;Locomotion Level;Stairs;Stand;Transfers    Examination-Participation Restrictions  Cleaning    Stability/Clinical Decision Making  Evolving/Moderate complexity    Rehab Potential  Fair    PT Frequency  2x / week    PT Duration  4 weeks    PT Treatment/Interventions  ADLs/Self Care Home Management;Aquatic Therapy;Cryotherapy;DME Instruction;Gait training;Stair training;Functional mobility training;Therapeutic activities;Therapeutic exercise;Balance training;Neuromuscular re-education;Patient/family education;Orthotic Fit/Training;Manual techniques;Passive range of motion;Dry needling;Energy conservation;Joint Manipulations    PT Next Visit Plan  Continue strengthening of BLE and balance challenges; avoid positions that increase LBP due to prostate cancer metastases. Respect pt's fatigue and pain levels    PT Home Exercise Plan  Eval: seated heel raises, seated toe raises, STS; 12/8: seated abduction with GTB; 1/12: STS       Patient will  benefit from skilled therapeutic intervention in order to improve the following deficits and impairments:  Abnormal gait, Cardiopulmonary status limiting activity, Decreased activity tolerance, Decreased balance, Decreased endurance, Decreased mobility, Decreased strength, Difficulty walking, Pain  Visit Diagnosis: Difficulty in walking, not elsewhere classified  Unsteadiness on feet  Other abnormalities of gait and mobility  Muscle weakness (generalized)     Problem List Patient Active Problem List   Diagnosis Date Noted  . Xerosis of skin 02/26/2019  . History of angioedema 01/10/2019  . Leukocytosis 09/01/2018  . Severe sepsis ([place]) 08/31/2018  . Spine metastasis ([place]) 03/24/2018  . History of DVT (deep vein thrombosis)   . Prostate cancer metastatic to multiple sites [facility])   . Radicular pain of thoracic region   . Bone lesion   . Chronic anticoagulation   . Right upper quadrant pain 02/26/2018  . Recurrent falls 11/02/2016  . History of prostate cancer 09/22/2016  . Substance abuse ([place]) 01/27/2016  . Vitamin D deficiency 12/05/2015  . Hyperlipidemia 01/28/2015  . Cataracts, bilateral 08/29/2014  . Diabetes mellitus treated with oral medication ([place]) 10/19/2013  . COPD, moderate ([place]) 05/31/2013  . Complex Lt Renal Cyst 03/22/2013  . Preventive measure 05/30/2012  . History of CVA 03/08/2012  . Normocytic anemia 03/08/2012  . CKD (chronic kidney disease) stage 3,  GFR 30-59 ml/min ([place]) 03/08/2012  . ERECTILE DYSFUNCTION 04/25/2008  . HIV disease ([place]) 04/27/2006  . Smoking greater than 30 pack years 04/27/2006  . Major depressive disorder, recurrent episode ([place]) 04/27/2006  . Hypertension 04/27/2006  . DEGENERATIVE JOINT DISEASE 04/27/2006  . Chronic Low Back Pain 04/27/2006   Teena Irani, PTA/CLT [phone]  Teena Irani 08/10/2019, 2:24 PM  [place] [address] [place], [place], [address] Phone:  [phone]   Fax:  [phone]  Name: Jacob Joseph MRN: [id number] Date of Birth: 01-17-1956

## 2019-08-14 ENCOUNTER — Telehealth: Payer: Self-pay | Admitting: *Deleted

## 2019-08-14 ENCOUNTER — Inpatient Hospital Stay: Payer: Medicaid Other | Attending: Oncology | Admitting: Nutrition

## 2019-08-14 NOTE — Progress Notes (Signed)
64 year old male diagnosed with advanced prostate cancer.  Past medical history includes hypertension, HIV, hepatitis C, diabetes, depression, COPD, and AKI.  Medications include Glucophage.  Labs were reviewed.  Height: 6 feet 0 inches. Weight: 220.8 pounds January 19. Usual body weight: 231 pounds in October.  Patient reports his appetite is decreased ever since getting injections for prostate cancer. Patient reports he has nausea especially with food odors. He denies constipation or diarrhea. Reports that he enjoyed oral nutrition supplements when he was in the hospital but cannot afford to purchase.  Nutrition diagnosis: Unintended weight loss related to prostate cancer as evidenced by 4% weight loss over 4 months.  Intervention: Educated patient to consume smaller more frequent meals and snacks. Educated patient on tips for eating with nausea. Will send message to provider asking for nausea medications. Provided oral nutrition supplements samples including Glucerna and boost glucose control. Fact sheets were given.  Questions were answered.  Teach back method was used.  Monitoring, evaluation, goals: Patient will increase oral intake for weight maintenance.  Next visit: Patient to contact me for any needs.  **Disclaimer: This note was dictated with voice recognition software. Similar sounding words can inadvertently be transcribed and this note may contain transcription errors which may not have been corrected upon publication of note.**

## 2019-08-14 NOTE — Telephone Encounter (Signed)
CALLED PATIENT TO REMIND OF INJECTION FOR 08-15-19 - ARRIVAL TIME- 11:15 AM @ [facility], LVM FOR A RETURN CALL

## 2019-08-15 ENCOUNTER — Other Ambulatory Visit: Payer: Self-pay

## 2019-08-15 ENCOUNTER — Encounter (HOSPITAL_COMMUNITY): Payer: 59

## 2019-08-15 ENCOUNTER — Encounter (HOSPITAL_COMMUNITY)
Admission: RE | Admit: 2019-08-15 | Discharge: 2019-08-15 | Disposition: A | Discharge status: Home / Self Care | Payer: 59 | Source: Ambulatory Visit | Attending: Radiation Oncology | Admitting: Radiation Oncology

## 2019-08-15 DIAGNOSIS — C61 Malignant neoplasm of prostate: Secondary | ICD-10-CM | POA: Insufficient documentation

## 2019-08-15 DIAGNOSIS — C7951 Secondary malignant neoplasm of bone: Secondary | ICD-10-CM | POA: Insufficient documentation

## 2019-08-15 MED ORDER — PROCHLORPERAZINE MALEATE 10 MG PO TABS: 10.0000 mg | ORAL_TABLET | Freq: Four times a day (QID) | ORAL | 3 refills | Status: AC | PRN

## 2019-08-15 MED ORDER — RADIUM RA 223 DICHLORIDE 30 MCCI/ML IV SOLN
143.0700 | Freq: Once | INTRAVENOUS | Status: AC | PRN
  Administered 2019-08-15: 143.07 via INTRAVENOUS

## 2019-08-15 NOTE — Addendum Note (Signed)
Addended by: Tyler Pita on: 08/15/2019 08:58 AM   Modules accepted: Orders

## 2019-08-15 NOTE — Progress Notes (Signed)
  Radiation Oncology         [phone] ________________________________  Name: Jacob Joseph MRN: [id number]  Date: 08/15/2019  DOB: [birth year]  Radium-223 Infusion Note  Diagnosis:  Castration resistant prostate cancer with painful bone involvement  Current Infusion:    2  Planned Infusions:  6  Narrative: Mr. [name] presented to nuclear medicine for treatment. His most recent blood counts were reviewed.  He remains a good candidate to proceed with Ra-223.  The patient was situated in an infusion suite with a contact barrier placed under his arm. Intravenous access was established, using sterile technique, and a normal saline infusion from a syringe was started.  Micro-dosimetry:  The prescribed radiation activity was assayed and confirmed to be within specified tolerance.  Special Treatment Procedure - Infusion:  The nuclear medicine technologist and I personally verified the dose activity to be delivered as specified in the written directive, and verified the patient identification via 2 separate methods.  The syringe containing the dose was attached to an intravenous access and the dose delivered over a minute. No complications were noted.  The total administered dose was 146.3 microcuries.   A saline flush of the line and the syringe that contained the isotope was then performed.  The residual radioactivity in the syringe was 3.23 microcuries, so the actual infused isotope activity was 143.07 microcuries.   Pressure was applied to the venipuncture site, and a compression bandage placed.   Radiation Safety personnel were present to perform the discharge survey, as detailed on their documentation.   After a short period of observation, the patient had his IV removed.  Impression:  The patient tolerated his infusion relatively well.  Plan:  The patient will return in one month for ongoing care.    ________________________________  Sheral Apley. Tammi Klippel, M.D.   This document  serves as a record of services personally performed by Tyler Pita, MD. It was created on his behalf by Wilburn Mylar, a trained medical scribe. The creation of this record is based on the scribe's personal observations and the provider's statements to them. This document has been checked and approved by the attending provider.

## 2019-08-17 ENCOUNTER — Ambulatory Visit (INDEPENDENT_AMBULATORY_CARE_PROVIDER_SITE_OTHER): Payer: 59 | Admitting: Internal Medicine

## 2019-08-17 ENCOUNTER — Encounter: Payer: Self-pay | Admitting: Internal Medicine

## 2019-08-17 ENCOUNTER — Other Ambulatory Visit: Payer: Self-pay

## 2019-08-17 VITALS — BP 165/92 | HR 102 | Temp 99.1°F | Ht 72.0 in | Wt 217.6 lb

## 2019-08-17 DIAGNOSIS — I129 Hypertensive chronic kidney disease with stage 1 through stage 4 chronic kidney disease, or unspecified chronic kidney disease: Secondary | ICD-10-CM | POA: Diagnosis not present

## 2019-08-17 DIAGNOSIS — M5137 Other intervertebral disc degeneration, lumbosacral region: Secondary | ICD-10-CM

## 2019-08-17 DIAGNOSIS — F339 Major depressive disorder, recurrent, unspecified: Secondary | ICD-10-CM

## 2019-08-17 DIAGNOSIS — E119 Type 2 diabetes mellitus without complications: Secondary | ICD-10-CM | POA: Diagnosis not present

## 2019-08-17 DIAGNOSIS — E1122 Type 2 diabetes mellitus with diabetic chronic kidney disease: Secondary | ICD-10-CM | POA: Diagnosis not present

## 2019-08-17 DIAGNOSIS — C61 Malignant neoplasm of prostate: Secondary | ICD-10-CM

## 2019-08-17 DIAGNOSIS — M545 Low back pain: Secondary | ICD-10-CM

## 2019-08-17 DIAGNOSIS — I10 Essential (primary) hypertension: Secondary | ICD-10-CM

## 2019-08-17 DIAGNOSIS — Z7984 Long term (current) use of oral hypoglycemic drugs: Secondary | ICD-10-CM

## 2019-08-17 DIAGNOSIS — N183 Chronic kidney disease, stage 3 unspecified: Secondary | ICD-10-CM

## 2019-08-17 DIAGNOSIS — N281 Cyst of kidney, acquired: Secondary | ICD-10-CM

## 2019-08-17 DIAGNOSIS — M51379 Other intervertebral disc degeneration, lumbosacral region without mention of lumbar back pain or lower extremity pain: Secondary | ICD-10-CM

## 2019-08-17 DIAGNOSIS — Z79899 Other long term (current) drug therapy: Secondary | ICD-10-CM

## 2019-08-17 DIAGNOSIS — C7951 Secondary malignant neoplasm of bone: Secondary | ICD-10-CM

## 2019-08-17 DIAGNOSIS — R296 Repeated falls: Secondary | ICD-10-CM

## 2019-08-17 DIAGNOSIS — M48061 Spinal stenosis, lumbar region without neurogenic claudication: Secondary | ICD-10-CM

## 2019-08-17 DIAGNOSIS — Z79891 Long term (current) use of opiate analgesic: Secondary | ICD-10-CM

## 2019-08-17 DIAGNOSIS — F3342 Major depressive disorder, recurrent, in full remission: Secondary | ICD-10-CM

## 2019-08-17 DIAGNOSIS — Z7901 Long term (current) use of anticoagulants: Secondary | ICD-10-CM

## 2019-08-17 DIAGNOSIS — Z9181 History of falling: Secondary | ICD-10-CM

## 2019-08-17 DIAGNOSIS — N1831 Chronic kidney disease, stage 3a: Secondary | ICD-10-CM

## 2019-08-17 DIAGNOSIS — G8929 Other chronic pain: Secondary | ICD-10-CM

## 2019-08-17 LAB — POCT GLYCOSYLATED HEMOGLOBIN (HGB A1C): Hemoglobin A1C: 6.8 % — AB (ref 4.0–5.6)

## 2019-08-17 LAB — GLUCOSE, CAPILLARY: Glucose-Capillary: 137 mg/dL — ABNORMAL HIGH (ref 70–99)

## 2019-08-17 MED ORDER — QUETIAPINE FUMARATE 400 MG PO TABS: 400.0000 mg | ORAL_TABLET | Freq: Every day | ORAL | 0 refills | Status: AC

## 2019-08-17 MED ORDER — HYDROCODONE-ACETAMINOPHEN 5-325 MG PO TABS: 1.0000 | ORAL_TABLET | Freq: Two times a day (BID) | ORAL | 0 refills | Status: DC | PRN

## 2019-08-17 MED ORDER — HYDROCODONE-ACETAMINOPHEN 5-325 MG PO TABS: 1.0000 | ORAL_TABLET | Freq: Two times a day (BID) | ORAL | 0 refills | Status: AC | PRN

## 2019-08-17 NOTE — Progress Notes (Signed)
   CC: Hypertension, Diabetes, Chronic Low Back Pain, CKD, Renal Cyst, Prostate cancer with metastases, Chronic anticoagulation, MDD, Recurrent falls  HPI:  [name] is a 64 y.o. M with PMHx listed below presenting for Hypertension, Diabetes, Chronic Low Back Pain, CKD, Renal Cyst, Prostate cancer with metastases, Chronic anticoagulation, MDD, Recurrent falls. Please see the A&P for the status of the patient's chronic medical problems.  Past Medical History:  Diagnosis Date  . Abnormal computed tomography of thoracic spine 02/26/2018  . AKI (acute kidney injury) ([place]) 09/24/2016  . BPH associated with nocturia 06/19/2016  . Calcium oxalate crystals in urine 12/23/2011   Asymptomatic, no hematuria. Advised to take plenty of water.  . Calculus of gallbladder without cholecystitis without obstruction   . Closed fracture of proximal end of left humerus with delayed healing 05/26/2018  . COPD (chronic obstructive pulmonary disease) ([place])   . Depression   . Diabetes mellitus without complication ([place]) 123456   pre diabetic  . Hepatitis C   . HIV (human immunodeficiency virus infection) ([place])   . Hypertension   . PE (pulmonary thromboembolism) ([place]) 05/12/2016   PE in 2017, on chronic anticoagulation for this and recurrent DVT.  [name] Kitchen Peripheral arterial disease ([place])    ooccluded left SFA by duplex ultrasound, tibial disease left greater than right  . Pressure injury of skin 01/13/2019  . Prostate cancer metastatic to multiple sites [facility])   . Right upper quadrant pain 02/26/2018  . Severe sepsis ([place]) 08/31/2018  . Stroke ([place]) 11/2011   Carotids Doppler negative. Right sided weakness resolved, initially on Plavix for 3 months and then continued with ASA.    . TB lung, latent 1988   Treated   Review of Systems:  Performed and all others negative.  Physical Exam:  Vitals:   08/17/19 1320 08/17/19 1322  BP:  (!) 165/92  Pulse:  (!) 102  Temp:  99.1 F (37.3 C)  TempSrc:  Oral  SpO2:   99%  Weight: 217 lb 9.6 oz (98.7 kg)   Height: 6' (1.829 m)    Physical Exam Constitutional:      General: He is not in acute distress.    Appearance: Normal appearance.  Cardiovascular:     Rate and Rhythm: Normal rate and regular rhythm.     Pulses: Normal pulses.     Heart sounds: Normal heart sounds.  Pulmonary:     Effort: Pulmonary effort is normal. No respiratory distress.     Breath sounds: Normal breath sounds.  Abdominal:     General: Bowel sounds are normal. There is no distension.     Palpations: Abdomen is soft.     Tenderness: There is no abdominal tenderness.  Musculoskeletal:        General: Tenderness present. No swelling or deformity.     Comments: Midline low back tenderness (unchanged) TTP over R lumbar paraspinal musculature Lumbar ROM:  mild tenderness with right rotation.  Skin:    General: Skin is warm and dry.  Neurological:     General: No focal deficit present.     Mental Status: Mental status is at baseline.    Assessment & Plan:   See Encounters Tab for problem based charting.  Patient discussed with Dr. Dareen Piano

## 2019-08-17 NOTE — Assessment & Plan Note (Addendum)
BP elevated to 165/92 today. This is in the setting of not taking his medications this morning. He states he only took the prescribed HCTZ for about 1 week due to the frequent urination, that was especially disruptive at night. We will stop his HCTZ given his history of falls and the urge and urgency this frequent urination causes (potenitally putting him in a hurry and an increase risk of falls).  Upon further discussion, he also states that he has not been taking any metoprolol for some time and the only medication he was taking during his Oncology appointment when BP was 0000000 systolic was the diltiazem. We will continue with diltiazem alone (dual indication of microalbuminuria) and plan to increase dose if BP is elevated at follow up. - Stop HCTZ - Continue Dilt 120mg  Daily - Recheck at follow up in about 3 months

## 2019-08-17 NOTE — Assessment & Plan Note (Signed)
Patient remains on Eliquis 2.5mg  BID. His Rx comes up as "expired" in our EMR, but he states he has continued to take this dose as prescribed and has extra. Will refill as needed. - Continue Eliquis 2.5mg  BID

## 2019-08-17 NOTE — Assessment & Plan Note (Signed)
CKD remains stable on recent lab work done at his oncology visit.  - Continue to avoid nephrotoxic agents

## 2019-08-17 NOTE — Assessment & Plan Note (Signed)
Cysts noted to be stable on CT Abd/Pelvis W/ contrast in 2018.

## 2019-08-17 NOTE — Assessment & Plan Note (Signed)
Patient follows with Oncology for castration-resistant prostate cancer with disease to the bone noted in 2017. Currently treated with Trudi Ida  And androgen deprivation (indefinite, through urology). His back pain is improving with pain medication and palliative radiation. He is also taking calcium and vitamin D supplements. - Continue treatment plan per Oncology

## 2019-08-17 NOTE — Assessment & Plan Note (Signed)
Patient's pain continues to improved with BID Hydrocodone and has been further improved with initiation of palliative radiation therapy initiated for his spine metastases.  MRI in November noted "Increased size and number of bone metastases:, "progressive degeneration at L4-5 with severe spinal, left lateral recess, and left neural foraminal stenosis, and "moderate spinal stenosis and moderate right neural foraminal stenosis at L3-4."  Mr Grieger state he has had some increased pain after a fall few days ago. (see note on falls). This pain is primarily muscular and mild to moderate, will avoid adding muscle relaxants due to their sedating effects, his history of falls, and good pain control with his regular regimen.  We discussed and filled out a pain contract today, a copy was provided to the patient. He states he has been staying away from the illicit and unprescribed medications that led to his somnolence and ED visit last year. - Hydrocodone-APAP 5-325mg  BID PRN - UDS deferred until follow up given he has been out of his medication for a few days - Follow up in 3 months

## 2019-08-17 NOTE — Assessment & Plan Note (Signed)
Patient states he has been doing better since starting PT. He states he had not fallen since starting PT (about a month ago) until 3 days ago. This fall occurred around 3 am when he was getting up to go to the bathroom and he was turning to close the bathroom door before using the bathroom and lost his balance. He landed on his bottom.  He has been able to ambulate since. He has some pain in his right paraspinal musculature (which is tender to palpation), but no new or additional pain in his mid back to make me suspect compression fracture or other serious injury. He states his pain remains controled with his current pain regimen. We discussed that we will avoid muscle relaxant despite the muscular pain due to the increased risk of fall and NSAIDs are not a good option given his CKD. He is comfortable and has good pain control with his hydrocodone. - Continue PT - Re-iterated the need to take his time when getting around, especially at night - Return precautions given

## 2019-08-17 NOTE — Assessment & Plan Note (Signed)
A1c improved to 6.8 today after dose increase of Metformin at previous visit. Will continue current regimen. - Metformin 1,000mg  BID - Recheck in 3 months

## 2019-08-17 NOTE — Assessment & Plan Note (Signed)
Patient has a history of recurrent major depression in remission on Citalopram. He additionally takes Seroquel at night for sleep. He states he has a lot of difficulty sleeping if he does not take it. He also states he will also take hydroxyzine on occasion if he is having extra difficulty in sleeping. We discussed the increased risk of fall with adding more sedating medications (especially considering his most recent fall was at 3 am when going to the bathroom). He is agreeable to stopping hydroxyzine as he does not use this frequently. - Citalopram 20mg  daily - Seroquel 400mg  qhs

## 2019-08-17 NOTE — Patient Instructions (Addendum)
Thank you for allowing [place] to care for you  For your high blood pressure - BP elevated today due to not taking medications - Continue taking diltiazem - We will increase dose if BP remains elevated at follow up  For your Diabetes - A1c today is improved to 6.8 - Continue current medications  For your back pain - Pain medicine refilled today - Pain contract filled out  For your falls - Continue with PT - Continue to be carefull when walking at night - If your pain does not improve in the next few weeks, let us know   Please follow up in about 3 months

## 2019-08-18 ENCOUNTER — Other Ambulatory Visit (HOSPITAL_COMMUNITY): Payer: Self-pay | Admitting: Radiation Oncology

## 2019-08-18 ENCOUNTER — Ambulatory Visit (HOSPITAL_COMMUNITY): Payer: 59

## 2019-08-18 ENCOUNTER — Other Ambulatory Visit: Payer: Self-pay

## 2019-08-18 ENCOUNTER — Encounter (HOSPITAL_COMMUNITY): Payer: Self-pay

## 2019-08-18 ENCOUNTER — Telehealth: Payer: Self-pay | Admitting: *Deleted

## 2019-08-18 DIAGNOSIS — R2689 Other abnormalities of gait and mobility: Secondary | ICD-10-CM

## 2019-08-18 DIAGNOSIS — R262 Difficulty in walking, not elsewhere classified: Secondary | ICD-10-CM

## 2019-08-18 DIAGNOSIS — M6281 Muscle weakness (generalized): Secondary | ICD-10-CM

## 2019-08-18 DIAGNOSIS — C61 Malignant neoplasm of prostate: Secondary | ICD-10-CM

## 2019-08-18 DIAGNOSIS — C7951 Secondary malignant neoplasm of bone: Secondary | ICD-10-CM

## 2019-08-18 DIAGNOSIS — R2681 Unsteadiness on feet: Secondary | ICD-10-CM

## 2019-08-18 NOTE — Therapy (Signed)
[facility] [place], [place], [address] Phone: [phone]   Fax:  [phone]  Physical Therapy Treatment  Patient Details  Name: Jacob Joseph MRN: [id number] Date of Birth: 1955-11-03 Referring Provider (PT): Velna Ochs, MD   Encounter Date: 08/18/2019  PT End of Session - 08/18/19 1049    Visit Number  9    Number of Visits  14   08/01/19 added 8 visits   Date for PT Re-Evaluation  08/29/19    Authorization Type  [employer], Secondary: Medicaid    Authorization Time Period  06/20/19 to 07/31/18; NEW 08/01/19 to 08/29/19    Authorization - Visit Number  4    Authorization - Number of Visits  10    PT Start Time  1050    PT Stop Time  1129    PT Time Calculation (min)  39 min    Equipment Utilized During Treatment  Gait belt    Activity Tolerance  Patient limited by pain;Patient tolerated treatment well   LBP   Behavior During Therapy  [facility] for tasks assessed/performed       Past Medical History:  Diagnosis Date  . Abnormal computed tomography of thoracic spine 02/26/2018  . AKI (acute kidney injury) ([place]) 09/24/2016  . BPH associated with nocturia 06/19/2016  . Calcium oxalate crystals in urine 12/23/2011   Asymptomatic, no hematuria. Advised to take plenty of water.  . Calculus of gallbladder without cholecystitis without obstruction   . Closed fracture of proximal end of left humerus with delayed healing 05/26/2018  . COPD (chronic obstructive pulmonary disease) ([place])   . Depression   . Diabetes mellitus without complication ([place]) 123456   pre diabetic  . Hepatitis C   . HIV (human immunodeficiency virus infection) ([place])   . Hypertension   . Leukocytosis 09/01/2018  . PE (pulmonary thromboembolism) ([place]) 05/12/2016   PE in 2017, on chronic anticoagulation for this and recurrent DVT.  [name] Kitchen Peripheral arterial disease ([place])    ooccluded left SFA by duplex ultrasound, tibial disease left greater than right  .  Pressure injury of skin 01/13/2019  . Prostate cancer metastatic to multiple sites [facility])   . Right upper quadrant pain 02/26/2018  . Severe sepsis ([place]) 08/31/2018  . Stroke ([place]) 11/2011   Carotids Doppler negative. Right sided weakness resolved, initially on Plavix for 3 months and then continued with ASA.    . TB lung, latent 1988   Treated    Past Surgical History:  Procedure Laterality Date  . HERNIA REPAIR    . INGUINAL HERNIA REPAIR  01/16/2012   Procedure: HERNIA REPAIR INGUINAL INCARCERATED;  Surgeon: Zenovia Jarred, MD;  Location: [place];  Service: General;  Laterality: Right;  . TEE WITHOUT CARDIOVERSION  12/07/2011   Procedure: TRANSESOPHAGEAL ECHOCARDIOGRAM (TEE);  Surgeon: Birdie Riddle, MD;  Location: [place];  Service: Cardiovascular;  Laterality: N/A;    There were no vitals filed for this visit.  Subjective Assessment - 08/18/19 1055    Subjective  Pt reports a fall on Monday, went to MD yesterday and believes he pulled a muscle and also reviewed current medication, able to stop 3 medications.    Pertinent History  Prostate cancer metastatic to multiple sites (LBP), Cancer treatment begins sometime December 2020    Patient Stated Goals  strengthen my legs so that I can stand and walk and stuff    Currently in Pain?  Yes    Pain Score  8     Pain Location  Back    Pain Orientation  Lower    Pain Descriptors / Indicators  Aching    Pain Type  Acute pain    Pain Radiating Towards  none    Pain Onset  In the past 7 days   fall on 1/25 wit hincreased LBP   Pain Frequency  Constant    Aggravating Factors   Constant pain secondary to cancel metastases, but improving daily since radiation    Pain Relieving Factors  lying still    Effect of Pain on Daily Activities  "Not much I can do"                       [facility] Adult PT Treatment/Exercise - 08/18/19 0001      Knee/Hip Exercises: Standing   Heel Raises  15 reps    Hip Abduction  Both;10 reps     Other Standing Knee Exercises  tandem stance 2x 20" (intermittent HHA)      Knee/Hip Exercises: Seated   Long Arc Quad  Both;10 reps;2 sets    [employer] Weight  4 lbs.    Marching  Both;10 reps;2 sets    Marching Weights  4 lbs.    Sit to Sand  2 sets;5 reps;without UE support   elevated surface, cueing for eccentric control with min A     Modalities   Modalities  Moist Heat      Moist Heat Therapy   Number Minutes Moist Heat  15 Minutes    Moist Heat Location  Lumbar Spine   During seated exercises            PT Education - 08/18/19 1112    Education Details  Safety factors to reduce risk of fall: encouraged to remove clutter/rugs, wear shoes, use AD and good lighting at night.  Discussed safety returning to chair, encouraged to reduce plopping into chair    Person(s) Educated  Patient    Methods  Explanation    Comprehension  Verbalized understanding       PT Short Term Goals - 08/01/19 1309      PT SHORT TERM GOAL #1   Title  Pt will perform HEP correctly and consistently, at least 2x/week to improve ambulation tolerance and reduce incidence of falls.    Baseline  1/12: pt reports performing exercises 1x/day    Time  3    Period  Weeks    Status  Achieved    Target Date  07/11/19      PT SHORT TERM GOAL #2   Title  Pt will perform 30 sec STS at 5 reps with BUE assist and rollator to improve ability to rise from chair and move about house.    Baseline  1/12: 5 reps, BUE assist, rocking momentum, no increased LBP    Time  3    Period  Weeks    Status  Achieved      PT SHORT TERM GOAL #3   Title  Pt will complete 2MWT at 200 feet with LRAD, without seated rest break to demo improve cardiovascular endurance and muscle strength.    Baseline  1/12: 173ft with SPC, seated rest at 1 min 20 sec    Time  3    Period  Weeks    Status  On-going        PT Long Term Goals - 08/01/19 1310      PT  LONG TERM GOAL #1   Title  Pt will score 19 on DGI to indicate  reduction in risk for falls with mobility around the home and in the community.    Baseline  1/12: 14 onDGI    Time  6    Period  Weeks    Status  On-going      PT LONG TERM GOAL #2   Title  Pt will perform 30 sec STS at 8 reps with BUE assist and rollator to improve ability to rise from chair and move about house.    Baseline  1/12: 5 reps, BUE assist, rocking momentum, no increased LBP    Time  6    Period  Weeks    Status  On-going      PT LONG TERM GOAL #3   Title  Pt will complete 2MWT at 350 feet with LRAD, without seated rest break to demo improve cardiovascular endurance and muscle strength.    Baseline  1/12: 171ft with SPC, seated rest at 1 min 20 sec    Time  6    Period  Weeks    Status  On-going      PT LONG TERM GOAL #4   Title  Pt will self report <10 falls since starting therapy to indicate improved strength and safety with mobility and improve overall quality of life.    Baseline  1/12: pt reports 2 falls since starting therapy    Time  6    Period  Weeks    Status  Achieved            Plan - 08/18/19 1105    Clinical Impression Statement  Pt limited by pain LBP following fall on Monday.  Reviewed factors that may increase risk of fall and encouraged pt to have good lighting at home, encouraged to wear good shoes for neuropathy, continue use of AD during gait and remove clutter/rugs to reduce risk of falls.  PT verbalized understanding.  Reduced activity tolerance today due to pain.  Began session wiht MHP during seated exercises wiht reports of relief following.  Reports pain reduced to 4-5/10 at [facility].    Personal Factors and Comorbidities  Comorbidity 3+    Comorbidities  Prostate cancer metastatic to multiple sites (LBP), stroke 2013, COPD,    Examination-Activity Limitations  Bed Mobility;Locomotion Level;Stairs;Stand;Transfers    Examination-Participation Restrictions  Cleaning    Stability/Clinical Decision Making  Evolving/Moderate complexity     Clinical Decision Making  Moderate    Rehab Potential  Fair    PT Frequency  2x / week    PT Duration  4 weeks    PT Treatment/Interventions  ADLs/Self Care Home Management;Aquatic Therapy;Cryotherapy;DME Instruction;Gait training;Stair training;Functional mobility training;Therapeutic activities;Therapeutic exercise;Balance training;Neuromuscular re-education;Patient/family education;Orthotic Fit/Training;Manual techniques;Passive range of motion;Dry needling;Energy conservation;Joint Manipulations    PT Next Visit Plan  Continue strengthening of BLE and balance challenges; avoid positions that increase LBP due to prostate cancer metastases. Respect pt's fatigue and pain levels    PT Home Exercise Plan  Eval: seated heel raises, seated toe raises, STS; 12/8: seated abduction with GTB; 1/12: STS       Patient will benefit from skilled therapeutic intervention in order to improve the following deficits and impairments:  Abnormal gait, Cardiopulmonary status limiting activity, Decreased activity tolerance, Decreased balance, Decreased endurance, Decreased mobility, Decreased strength, Difficulty walking, Pain  Visit Diagnosis: Unsteadiness on feet  Other abnormalities of gait and mobility  Muscle weakness (generalized)  Difficulty in walking, not  elsewhere classified     Problem List Patient Active Problem List   Diagnosis Date Noted  . Xerosis of skin 02/26/2019  . History of angioedema 01/10/2019  . Spine metastasis ([place]) 03/24/2018  . History of DVT (deep vein thrombosis)   . Prostate cancer metastatic to multiple sites [facility])   . Radicular pain of thoracic region   . Chronic anticoagulation   . Recurrent falls 11/02/2016  . Substance abuse ([place]) 01/27/2016  . Vitamin D deficiency 12/05/2015  . Hyperlipidemia 01/28/2015  . Cataracts, bilateral 08/29/2014  . Diabetes mellitus treated with oral medication ([place]) 10/19/2013  . COPD, moderate ([place]) 05/31/2013  . Complex Lt Renal  Cyst 03/22/2013  . Preventive measure 05/30/2012  . History of CVA 03/08/2012  . Normocytic anemia 03/08/2012  . CKD (chronic kidney disease) stage 3, GFR 30-59 ml/min ([facility]) 03/08/2012  . ERECTILE DYSFUNCTION 04/25/2008  . HIV disease ([place]) 04/27/2006  . Smoking greater than 30 pack years 04/27/2006  . Major depressive disorder, recurrent episode, in full remission ([place]) 04/27/2006  . Hypertension 04/27/2006  . DEGENERATIVE JOINT DISEASE 04/27/2006  . Chronic Low Back Pain 04/27/2006   Ihor Austin, LPTA; CBIS [phone]  Aldona Lento 08/18/2019, 6:35 PM  [facility] [address] [place], [place], [address] Phone: [phone]   Fax:  [phone]  Name: Jacob Joseph MRN: [id number] Date of Birth: 1956-01-27

## 2019-08-18 NOTE — Telephone Encounter (Signed)
CALLED PATIENT TO INFORM OF LAB AND WEIGHT APPT. FOR 09-08-19 @ 12 PM @ [facility]. ON 09-15-19 - ARRIVAL TIME- 1:45 PM @ [facility], LVM FOR A RETURN CALL

## 2019-08-20 ENCOUNTER — Other Ambulatory Visit: Payer: Self-pay | Admitting: Podiatry

## 2019-08-21 NOTE — Progress Notes (Signed)
Internal Medicine Clinic Attending  Case discussed with Dr. Melvin  at the time of the visit.  We reviewed the resident's history and exam and pertinent patient test results.  I agree with the assessment, diagnosis, and plan of care documented in the resident's note.  

## 2019-08-22 ENCOUNTER — Encounter (HOSPITAL_COMMUNITY): Payer: Self-pay

## 2019-08-22 ENCOUNTER — Encounter: Payer: Self-pay | Admitting: Nutrition

## 2019-08-22 ENCOUNTER — Ambulatory Visit (HOSPITAL_COMMUNITY): Payer: 59 | Attending: Internal Medicine

## 2019-08-22 ENCOUNTER — Other Ambulatory Visit: Payer: Self-pay

## 2019-08-22 DIAGNOSIS — M6281 Muscle weakness (generalized): Secondary | ICD-10-CM

## 2019-08-22 DIAGNOSIS — R2681 Unsteadiness on feet: Secondary | ICD-10-CM

## 2019-08-22 DIAGNOSIS — R262 Difficulty in walking, not elsewhere classified: Secondary | ICD-10-CM | POA: Diagnosis not present

## 2019-08-22 DIAGNOSIS — R2689 Other abnormalities of gait and mobility: Secondary | ICD-10-CM | POA: Diagnosis present

## 2019-08-22 NOTE — Therapy (Signed)
[place], [place], [address] Phone: [phone]   Fax:  [phone]  Physical Therapy Treatment  Patient Details  Name: Jacob Joseph MRN: [id number] Date of Birth: Dec 09, 1955 Referring Provider (PT): Velna Ochs, MD   Encounter Date: 08/22/2019  PT End of Session - 08/22/19 1131    Visit Number  10    Number of Visits  14   08/01/19 added 8 visits   Date for PT Re-Evaluation  08/29/19    Authorization Type  [employer], Secondary: Medicaid    Authorization Time Period  06/20/19 to 07/31/18; NEW 08/01/19 to 08/29/19    Authorization - Visit Number  5    Authorization - Number of Visits  10    PT Start Time  [id number]    PT Stop Time  1158    PT Time Calculation (min)  40 min    Equipment Utilized During Treatment  Gait belt   WC and SPC   Activity Tolerance  Patient limited by pain;Patient tolerated treatment well    Behavior During Therapy  [facility] for tasks assessed/performed       Past Medical History:  Diagnosis Date  . Abnormal computed tomography of thoracic spine 02/26/2018  . AKI (acute kidney injury) ([place]) 09/24/2016  . BPH associated with nocturia 06/19/2016  . Calcium oxalate crystals in urine 12/23/2011   Asymptomatic, no hematuria. Advised to take plenty of water.  . Calculus of gallbladder without cholecystitis without obstruction   . Closed fracture of proximal end of left humerus with delayed healing 05/26/2018  . COPD (chronic obstructive pulmonary disease) ([place])   . Depression   . Diabetes mellitus without complication ([place]) [id number]   pre diabetic  . Hepatitis C   . HIV (human immunodeficiency virus infection) ([place])   . Hypertension   . Leukocytosis 09/01/2018  . PE (pulmonary thromboembolism) ([place]) 05/12/2016   PE in 2017, on chronic anticoagulation for this and recurrent DVT.  [name] Kitchen Peripheral arterial disease ([place])    ooccluded left SFA by duplex ultrasound, tibial disease left greater than right   . Pressure injury of skin 01/13/2019  . Prostate cancer metastatic to multiple sites [facility])   . Right upper quadrant pain 02/26/2018  . Severe sepsis ([place]) 08/31/2018  . Stroke ([place]) 11/2011   Carotids Doppler negative. Right sided weakness resolved, initially on Plavix for 3 months and then continued with ASA.    . TB lung, latent 1988   Treated    Past Surgical History:  Procedure Laterality Date  . HERNIA REPAIR    . INGUINAL HERNIA REPAIR  01/16/2012   Procedure: HERNIA REPAIR INGUINAL INCARCERATED;  Surgeon: Zenovia Jarred, MD;  Location: [place];  Service: General;  Laterality: Right;  . TEE WITHOUT CARDIOVERSION  12/07/2011   Procedure: TRANSESOPHAGEAL ECHOCARDIOGRAM (TEE);  Surgeon: Birdie Riddle, MD;  Location: [place];  Service: Cardiovascular;  Laterality: N/A;    There were no vitals filed for this visit.  Subjective Assessment - 08/22/19 1125    Subjective  Pt reports he fell last Monday and he fell backwards onto his back. Pt reports MD thinks pt pulled a muscle in his back. Pt reports just below his R knee on the outside has hurt ever since and "it feels like something's craking everytime I stand up". Pt reports he has a cancer center f/u appointment tomorrow and will find out if he has to do chemo next.    Pertinent History  Prostate  cancer metastatic to multiple sites (LBP), Cancer treatment begins sometime December 2020    Limitations  Standing;Walking    How long can you sit comfortably?  no issues    How long can you stand comfortably?  <10 minutes    How long can you walk comfortably?  <10 minutes    Diagnostic tests  06/05/19 MRI: bone metastases increase since 02/27/18, L4-5 disc degeneration, R foraminal stenosis L3-4    Patient Stated Goals  strengthen my legs so that I can stand and walk and stuff    Currently in Pain?  Yes    Pain Score  10-Worst pain ever    Pain Location  Knee    Pain Orientation  Right;Lateral   just below knee   Pain Descriptors /  Indicators  --   "cracking"   Pain Onset  In the past 7 days   fall on 1/25 wit hincreased LBP   Pain Frequency  Constant    Aggravating Factors   Constant pain secondary to cancel metastases, but improving daily since radiation    Pain Relieving Factors  lying still    Effect of Pain on Daily Activities  "not much I can do"             [facility] Adult PT Treatment/Exercise - 08/22/19 0001      Transfers   Transfers  Squat Pivot Transfers;Stand Pivot Transfers;Sit to Stand    Sit to Stand  3: Mod assist;4: Min guard    Sit to Stand Details (indicate cue type and reason)  elevated mat table, verbal cues for pressing through heels and flat feet, BUE assist to power up, maintains static 5-10 deg knee flexion in stance    Stand Pivot Transfers  3: Mod assist;4: Min guard    Squat Pivot Transfers  3: Mod assist;4: Min guard    Comments  squat and stand pivot transfer mod assist progressing to min guard assist with reps, verbal cues for hand placement to assist in power up from seated surface      Knee/Hip Exercises: Seated   Long Arc Quad  Both;10 reps;2 sets    [employer] Limitations  2# 1st set, 4# 2nd set    Marching  Both;10 reps;2 sets    Marching Weights  2 lbs.             PT Education - 08/22/19 1202    Education Details  Exercise technique, continue HEP, telling MD at cancer center tomorrow about increase in R LE pain just below the knee on the outside    [facility]) Educated  Patient    Methods  Explanation    Comprehension  Verbalized understanding       PT Short Term Goals - 08/01/19 1309      PT SHORT TERM GOAL #1   Title  Pt will perform HEP correctly and consistently, at least 2x/week to improve ambulation tolerance and reduce incidence of falls.    Baseline  1/12: pt reports performing exercises 1x/day    Time  3    Period  Weeks    Status  Achieved    Target Date  07/11/19      PT SHORT TERM GOAL #2   Title  Pt will perform 30 sec STS at 5 reps with  BUE assist and rollator to improve ability to rise from chair and move about house.    Baseline  1/12: 5 reps, BUE assist, rocking momentum, no increased LBP  Time  3    Period  Weeks    Status  Achieved      PT SHORT TERM GOAL #3   Title  Pt will complete 2MWT at 200 feet with LRAD, without seated rest break to demo improve cardiovascular endurance and muscle strength.    Baseline  1/12: 170ft with SPC, seated rest at 1 min 20 sec    Time  3    Period  Weeks    Status  On-going        PT Long Term Goals - 08/01/19 1310      PT LONG TERM GOAL #1   Title  Pt will score 19 on DGI to indicate reduction in risk for falls with mobility around the home and in the community.    Baseline  1/12: 14 onDGI    Time  6    Period  Weeks    Status  On-going      PT LONG TERM GOAL #2   Title  Pt will perform 30 sec STS at 8 reps with BUE assist and rollator to improve ability to rise from chair and move about house.    Baseline  1/12: 5 reps, BUE assist, rocking momentum, no increased LBP    Time  6    Period  Weeks    Status  On-going      PT LONG TERM GOAL #3   Title  Pt will complete 2MWT at 350 feet with LRAD, without seated rest break to demo improve cardiovascular endurance and muscle strength.    Baseline  1/12: 145ft with SPC, seated rest at 1 min 20 sec    Time  6    Period  Weeks    Status  On-going      PT LONG TERM GOAL #4   Title  Pt will self report <10 falls since starting therapy to indicate improved strength and safety with mobility and improve overall quality of life.    Baseline  1/12: pt reports 2 falls since starting therapy    Time  6    Period  Weeks    Status  Achieved            Plan - 08/22/19 1133    Clinical Impression Statement  Pt received in waiting room, unable to stand from chair independently, required moderate assist to rise from chair. Pt demonstrates mild R knee buckling 5-10 degrees when attempting to take a step and requested WC.  Therapist assisted pt into WC with squat pivot transfer, moderate assist and wheeled pt into therapy gym for assessment. Decreased weight with seated exercises due to weakness and fatigue, pt with difficulty performing LAQ to end range with 10 reps due to fatiguing. Pt tolerates seated strengthening and transfer training WC<>elevated mat table, reporting decrease in pain with pain medication kicking in during session. Pt continues to fatigue with exercise requiring 4-5 therapeutic rest breaks to recover. Pt returned to waiting room via [facility] and stand pivot transfer to chair contact guard assist to wait for transportation. Educated pt to call for therapist if needed to transfer into transportation once arrival and pt verbalized understanding.    Personal Factors and Comorbidities  Comorbidity 3+    Comorbidities  Prostate cancer metastatic to multiple sites (LBP), stroke 2013, COPD,    Examination-Activity Limitations  Bed Mobility;Locomotion Level;Stairs;Stand;Transfers    Examination-Participation Restrictions  Cleaning    Stability/Clinical Decision Making  Evolving/Moderate complexity    Rehab Potential  Fair  PT Frequency  2x / week    PT Duration  4 weeks    PT Treatment/Interventions  ADLs/Self Care Home Management;Aquatic Therapy;Cryotherapy;DME Instruction;Gait training;Stair training;Functional mobility training;Therapeutic activities;Therapeutic exercise;Balance training;Neuromuscular re-education;Patient/family education;Orthotic Fit/Training;Manual techniques;Passive range of motion;Dry needling;Energy conservation;Joint Manipulations    PT Next Visit Plan  f/u with cancer center appointment regarding cancer treatment and R knee pain. Continue strengthening of BLE and balance challenges; avoid positions that increase LBP due to prostate cancer metastases. Respect pt's fatigue and pain levels    PT Home Exercise Plan  Eval: seated heel raises, seated toe raises, STS; 12/8: seated abduction with  GTB; 1/12: STS    Consulted and Agree with Plan of Care  Patient       Patient will benefit from skilled therapeutic intervention in order to improve the following deficits and impairments:  Abnormal gait, Cardiopulmonary status limiting activity, Decreased activity tolerance, Decreased balance, Decreased endurance, Decreased mobility, Decreased strength, Difficulty walking, Pain  Visit Diagnosis: Difficulty in walking, not elsewhere classified  Muscle weakness (generalized)  Other abnormalities of gait and mobility  Unsteadiness on feet     Problem List Patient Active Problem List   Diagnosis Date Noted  . Xerosis of skin 02/26/2019  . History of angioedema 01/10/2019  . Spine metastasis ([place]) 03/24/2018  . History of DVT (deep vein thrombosis)   . Prostate cancer metastatic to multiple sites [facility])   . Radicular pain of thoracic region   . Chronic anticoagulation   . Recurrent falls 11/02/2016  . Substance abuse ([place]) 01/27/2016  . Vitamin D deficiency 12/05/2015  . Hyperlipidemia 01/28/2015  . Cataracts, bilateral 08/29/2014  . Diabetes mellitus treated with oral medication ([place]) 10/19/2013  . COPD, moderate ([place]) 05/31/2013  . Complex Lt Renal Cyst 03/22/2013  . Preventive measure 05/30/2012  . History of CVA 03/08/2012  . Normocytic anemia 03/08/2012  . CKD (chronic kidney disease) stage 3, GFR 30-59 ml/min ([facility]) 03/08/2012  . ERECTILE DYSFUNCTION 04/25/2008  . HIV disease ([employer]) 04/27/2006  . Smoking greater than 30 pack years 04/27/2006  . Major depressive disorder, recurrent episode, in full remission ([place]) 04/27/2006  . Hypertension 04/27/2006  . DEGENERATIVE JOINT DISEASE 04/27/2006  . Chronic Low Back Pain 04/27/2006     Talbot Grumbling PT, DPT 08/22/19, 12:08 PM [place] [address] [place], [place], [address] Phone: [phone]   Fax:  [phone]  Name: Slaton Courter MRN:  [id number] Date of Birth: [birth year]

## 2019-08-22 NOTE — Progress Notes (Signed)
Provided samples of Glucerna and Boost Glucose Control.

## 2019-08-23 ENCOUNTER — Telehealth: Payer: Self-pay

## 2019-08-23 ENCOUNTER — Encounter: Payer: Self-pay | Admitting: Urology

## 2019-08-23 ENCOUNTER — Ambulatory Visit
Admission: RE | Admit: 2019-08-23 | Discharge: 2019-08-23 | Disposition: A | Discharge status: Home / Self Care | Payer: 59 | Source: Ambulatory Visit | Attending: Urology | Admitting: Urology

## 2019-08-23 ENCOUNTER — Other Ambulatory Visit: Payer: Self-pay

## 2019-08-23 VITALS — BP 138/87 | HR 113 | Temp 97.4°F | Resp 18 | Ht 72.0 in | Wt 200.2 lb

## 2019-08-23 DIAGNOSIS — J449 Chronic obstructive pulmonary disease, unspecified: Secondary | ICD-10-CM | POA: Insufficient documentation

## 2019-08-23 DIAGNOSIS — M5136 Other intervertebral disc degeneration, lumbar region: Secondary | ICD-10-CM | POA: Insufficient documentation

## 2019-08-23 DIAGNOSIS — Z8673 Personal history of transient ischemic attack (TIA), and cerebral infarction without residual deficits: Secondary | ICD-10-CM | POA: Insufficient documentation

## 2019-08-23 DIAGNOSIS — I10 Essential (primary) hypertension: Secondary | ICD-10-CM | POA: Insufficient documentation

## 2019-08-23 DIAGNOSIS — E1151 Type 2 diabetes mellitus with diabetic peripheral angiopathy without gangrene: Secondary | ICD-10-CM | POA: Diagnosis not present

## 2019-08-23 DIAGNOSIS — F329 Major depressive disorder, single episode, unspecified: Secondary | ICD-10-CM | POA: Insufficient documentation

## 2019-08-23 DIAGNOSIS — I739 Peripheral vascular disease, unspecified: Secondary | ICD-10-CM | POA: Diagnosis not present

## 2019-08-23 DIAGNOSIS — B20 Human immunodeficiency virus [HIV] disease: Secondary | ICD-10-CM | POA: Insufficient documentation

## 2019-08-23 DIAGNOSIS — Z86718 Personal history of other venous thrombosis and embolism: Secondary | ICD-10-CM | POA: Diagnosis not present

## 2019-08-23 DIAGNOSIS — Z7982 Long term (current) use of aspirin: Secondary | ICD-10-CM | POA: Diagnosis not present

## 2019-08-23 DIAGNOSIS — Z192 Hormone resistant malignancy status: Secondary | ICD-10-CM | POA: Diagnosis not present

## 2019-08-23 DIAGNOSIS — B1920 Unspecified viral hepatitis C without hepatic coma: Secondary | ICD-10-CM | POA: Diagnosis not present

## 2019-08-23 DIAGNOSIS — Z79899 Other long term (current) drug therapy: Secondary | ICD-10-CM | POA: Diagnosis not present

## 2019-08-23 DIAGNOSIS — Z7984 Long term (current) use of oral hypoglycemic drugs: Secondary | ICD-10-CM | POA: Diagnosis not present

## 2019-08-23 DIAGNOSIS — C7951 Secondary malignant neoplasm of bone: Secondary | ICD-10-CM

## 2019-08-23 DIAGNOSIS — F17210 Nicotine dependence, cigarettes, uncomplicated: Secondary | ICD-10-CM | POA: Insufficient documentation

## 2019-08-23 DIAGNOSIS — C61 Malignant neoplasm of prostate: Secondary | ICD-10-CM | POA: Diagnosis not present

## 2019-08-23 NOTE — Progress Notes (Signed)
Mr Jacob Joseph is here today for follow up visit. C/o pain 9/10 from the side of his knee to his lower back taking medication for pain that relieves the pain. C/o moderate fatigue, mild SOB with activity. Fine motor skills intact able to grasp and pick up items. Can ambulate short distances with cane. Coughing up clear phlegm.   Today's Vitals   08/23/19 1425  BP: 138/87  Pulse: (!) 113  Resp: 18  Temp: (!) 97.4 F (36.3 C)  TempSrc: Temporal  SpO2: 99%  Weight: 200 lb 4 oz (90.8 kg)  Height: 6' (1.829 m)  PainSc: 9    Body mass index is 27.16 kg/m.

## 2019-08-23 NOTE — Progress Notes (Signed)
Radiation Oncology         [phone] ________________________________  Post-treatment Follow Up  Name: Jacob Joseph MRN: [id number]  Date of Service: 08/23/2019 DOB: Mar 09, 1956  [id number], Sheppard Coil, MD  [name], MD   REFERRING PHYSICIAN: [name], MD  DIAGNOSIS: 64 y.o. 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    ICD-10-CM   1. Secondary malignant neoplasm of bone and bone marrow (HCC)  C79.51    C79.52   2. Prostate cancer metastatic to multiple sites ([place])  C61   3. Spine metastasis ([place])  C79.51     HISTORY OF PRESENT ILLNESS: Jacob Joseph is a 64 y.o. male with a history of painful bony mets to spine secondary to metastatic castration resistant prostate cancer.  In summary, he was initially diagnosed with locally advanced, Gleason 4+5 prostate cancer on 08/25/2016 after presenting with an elevated PSA of 23.1.  His metastatic workup revealed right-sided pelvic lymphadenopathy on CT A/P and his bone scan was negative for any evidence of metastatic disease. He was started on ADT with Lupron by [name] on 09/24/2016, given every 4 months which he has continued but unfortunately his PSA continued to rise despite ADT.    He was hospitalized August 10-13th 2019 for right-sided flank pain, and workup revealed bony metastatic disease. CT abdomen/pelvis on 02/26/18 showed no acute abnormality in the abdomen or pelvis, but there was a 2.8 cm focal sclerotic lesion identified in the T10 vertebral body. MRI of the thoracic and lumbar spine were performed for further evaluation and showed abnormal bone lesions in the T2, T5, T9, T10, L1, L3, and L4 vertebral bodies. A bone scan on 03/01/2018 showed new areas of abnormal radiotracer localization, involving T11, left posterior 6th rib, and superior right iliac crest as compared to his prior bone scan from 08/2016.   He was therefore referred to Dr. Alen Blew on 03/11/2018 and started  on daily Zytiga with prednisone in addition to Lupron ADT. He was referred for consideration of palliative XRT to painful disease in the spine from T8 - T12 and this was completed 04/15/18. He tolerated treatment well and had resolution of the mid back and flank pain following radiation.  Unfortunately his PSA continued to rise despite his treatment and was up to 10.5 in 01/2019, up to 14.40 04/07/19 and most recently at 33.2 on 05/19/19. The Zytiga was discontinued in 04/2019 due to disease progression and he was started on bicalutamide (Casodex) in addition to Lupron ADT which he has continued taking as prescribed and tolerating well. He received a 6 month Eligard injection on 06/08/19.  09/2018 - 5.5 01/2019 - 10.5 03/2019 -  14.40 04/2019 - 33.2 07/19/19 - 67.6  More recently, he began to develop left sided lower back pain, just above the beltline, in the midline and out to the left which was progressively worsening. He denied radiation of pain into the buttocks or LEs and denied paraesthesias or focal weakness in the LEs.  He underwent repeat bone scan on 04/10/2019, which showed several new foci of metastasis in the pelvis and ribs as well as increase in intensity of lesions in the spine. Repeat CT A/P 04/20/19 for disease restaging also showed progressive osseous metastasis as evidenced by a new lower thoracic sclerotic lesion and increased definition of a T10 lesion, as compared to 02/2018 but a decrease in right pelvic sidewall isolated adenopathy.  Due to his progressive low back pain, he proceeded to  lumbar spine MRI on 06/05/2019, which showed significant DDD in the lumbar spine as well as an increased size and number of bone metastases since 02/2018.  There was an enhancing lesion in the L1 vertebral body measuring 1.8 cm (previously 5 mm), and a  2.1 cm lesion is partially visualized in the T11 vertebral body. New lesions included a 1.6 cm lesion in the right L4 pedicle as well as bilateral upper  sacral and posterior iliac lesions. There was a 2 cm lesion in the medial right upper sacral ala. There was no epidural tumor and no fracture but did show progressive disc degeneration at L4-5 with severe spinal, left lateral recess, and left neural foraminal stenosis. We saw him back in the clinic on 06/20/19 and he elected to proceed with an additional course of palliative radiation to the progressive, painful disease in the lumbar spine, completed on 07/20/19.  He was also started on Xofigo, with his first of 6 planned infusions on 07/18/19 and tolerated well. He is scheduled for Xofigo infusion 3 of 6 on 09/08/19.  Of note, the patient has COPD and regularly uses inhalers. He has HIV which is well-controlled under the care of Dr. Megan Salon.   PREVIOUS RADIATION THERAPY: Yes 12/17-12/31/20:   T12-L2 was treated to 30 Gy in 10 fractions of 3 Gy  04/04/18 - 04/15/18: Spine, T8-T12/ total of 30 Gy delivered in 10 fractions of 3 Gy  PAST MEDICAL HISTORY:  Past Medical History:  Diagnosis Date  . Abnormal computed tomography of thoracic spine 02/26/2018  . AKI (acute kidney injury) ([place]) 09/24/2016  . BPH associated with nocturia 06/19/2016  . Calcium oxalate crystals in urine 12/23/2011   Asymptomatic, no hematuria. Advised to take plenty of water.  . Calculus of gallbladder without cholecystitis without obstruction   . Closed fracture of proximal end of left humerus with delayed healing 05/26/2018  . COPD (chronic obstructive pulmonary disease) ([place])   . Depression   . Diabetes mellitus without complication ([place]) 123456   pre diabetic  . Hepatitis C   . HIV (human immunodeficiency virus infection) ([place])   . Hypertension   . Leukocytosis 09/01/2018  . PE (pulmonary thromboembolism) ([place]) 05/12/2016   PE in 2017, on chronic anticoagulation for this and recurrent DVT.  [name] Kitchen Peripheral arterial disease ([place])    ooccluded left SFA by duplex ultrasound, tibial disease left greater than right  . Pressure  injury of skin 01/13/2019  . Prostate cancer metastatic to multiple sites [facility])   . Right upper quadrant pain 02/26/2018  . Severe sepsis ([place]) 08/31/2018  . Stroke ([place]) 11/2011   Carotids Doppler negative. Right sided weakness resolved, initially on Plavix for 3 months and then continued with ASA.    . TB lung, latent 1988   Treated      PAST SURGICAL HISTORY: Past Surgical History:  Procedure Laterality Date  . HERNIA REPAIR    . INGUINAL HERNIA REPAIR  01/16/2012   Procedure: HERNIA REPAIR INGUINAL INCARCERATED;  Surgeon: Zenovia Jarred, MD;  Location: [facility];  Service: General;  Laterality: Right;  . TEE WITHOUT CARDIOVERSION  12/07/2011   Procedure: TRANSESOPHAGEAL ECHOCARDIOGRAM (TEE);  Surgeon: Birdie Riddle, MD;  Location: [facility] ENDOSCOPY;  Service: Cardiovascular;  Laterality: N/A;    FAMILY HISTORY:  Family History  Problem Relation Age of Onset  . Hypertension Mother   . Heart attack Mother   . Stroke Mother   . Hypertension Father   . Cancer Father  SOCIAL HISTORY:  Social History   Socioeconomic History  . Marital status: Married    Spouse name: Not on file  . Number of children: Not on file  . Years of education: Not on file  . Highest education level: Not on file  Occupational History  . Occupation: disabled  Tobacco Use  . Smoking status: Current Every Day Smoker    Packs/day: 0.45    Types: Cigarettes    Last attempt to quit: 11/18/2014    Years since quitting: 4.7  . Smokeless tobacco: Never Used  . Tobacco comment: 5-6 cigs per day; ready to quit  Substance and Sexual Activity  . Alcohol use: No    Alcohol/week: 0.0 standard drinks  . Drug use: Yes    Frequency: 3.0 times per week    Types: "Crack" cocaine, Cocaine    Comment: last times saturday  . Sexual activity: Not on file    Comment: declined condoms  Other Topics Concern  . Not on file  Social History Narrative   Pt lives with wife and grandkid in [place].   Has applied for  disability and is pending.   Worked in [employer] before about 3 years ago.      Social Determinants of Health   Financial Resource Strain:   . Difficulty of Paying Living Expenses: Not on file  Food Insecurity:   . Worried About Charity fundraiser in the Last Year: Not on file  . Ran Out of Food in the Last Year: Not on file  Transportation Needs:   . Lack of Transportation (Medical): Not on file  . Lack of Transportation (Non-Medical): Not on file  Physical Activity:   . Days of Exercise per Week: Not on file  . Minutes of Exercise per Session: Not on file  Stress:   . Feeling of Stress : Not on file  Social Connections:   . Frequency of Communication with Friends and Family: Not on file  . Frequency of Social Gatherings with Friends and Family: Not on file  . Attends Religious Services: Not on file  . Active Member of Clubs or Organizations: Not on file  . Attends Archivist Meetings: Not on file  . Marital Status: Not on file  Intimate Partner Violence:   . Fear of Current or Ex-Partner: Not on file  . Emotionally Abused: Not on file  . Physically Abused: Not on file  . Sexually Abused: Not on file    ALLERGIES: Lisinopril and Statins  MEDICATIONS:  Current Outpatient Medications  Medication Sig Dispense Refill  . aspirin EC 81 MG tablet Take 81 mg by mouth daily.    [name] Kitchen azelastine (ASTELIN) 0.1 % nasal spray USE 1 SPRAY IN EACH NOSTRIL TWICE DAILY AS NEEDED FOR RHINITIS 90 mL 1  . bicalutamide (CASODEX) 50 MG tablet Take 50 mg by mouth daily.    . bictegravir-emtricitabine-tenofovir AF (BIKTARVY) 50-200-25 MG TABS tablet Take 1 tablet by mouth daily. 30 tablet 11  . CHANTIX 0.5 MG tablet Take 0.5 mg by mouth daily. Then take 1 tablet by mouth twice daily.    . cholecalciferol (VITAMIN D) 1000 units tablet Take 1 tablet (1,000 Units total) by mouth daily. (Patient taking differently: Take 2,000 Units by mouth daily. ) 90 tablet 3  . ciclopirox (LOPROX) 0.77 %  cream APPLY TOPICALLY TO THE AFFECTED AREA TWICE DAILY 30 g 1  . citalopram (CELEXA) 20 MG tablet Take 20 mg by mouth daily.    [name] Kitchen diltiazem (  DILT-XR) 120 MG 24 hr capsule TAKE 1 CAPSULE(120 MG) BY MOUTH DAILY 90 capsule 0  . FLOVENT HFA 220 MCG/ACT inhaler USE 1 PUFF BY MOUTH TWICE DAILY 12 g 2  . gabapentin (NEURONTIN) 300 MG capsule Take 2 capsules (600 mg total) by mouth 2 (two) times daily. 120 capsule 2  . HYDROcodone-acetaminophen (NORCO/VICODIN) 5-325 MG tablet Take 1 tablet by mouth every 12 (twelve) hours as needed for up to 60 doses for moderate pain or severe pain. 60 tablet 0  . HYDROcodone-acetaminophen (NORCO/VICODIN) 5-325 MG tablet Take 1 tablet by mouth every 12 (twelve) hours as needed for up to 60 doses for moderate pain. 60 tablet 0  . HYDROcodone-acetaminophen (NORCO/VICODIN) 5-325 MG tablet Take 1 tablet by mouth every 12 (twelve) hours as needed for up to 60 doses for moderate pain or severe pain. 60 tablet 0  . metFORMIN (GLUCOPHAGE) 1000 MG tablet Take 1 tablet (1,000 mg total) by mouth 2 (two) times daily with a meal. (Patient taking differently: Take 500 mg by mouth 2 (two) times daily with a meal. ) 180 tablet 1  . pravastatin (PRAVACHOL) 40 MG tablet TAKE 1 TABLET(40 MG) BY MOUTH DAILY 90 tablet 1  . prochlorperazine (COMPAZINE) 10 MG tablet Take 1 tablet (10 mg total) by mouth every 6 (six) hours as needed for nausea or vomiting. 30 tablet 3  . QUEtiapine (SEROQUEL) 400 MG tablet Take 1 tablet (400 mg total) by mouth at bedtime. 90 tablet 0  . STIOLTO RESPIMAT 2.5-2.5 MCG/ACT AERS INL 2 PUFFS PO ITL D    . tamsulosin (FLOMAX) 0.4 MG CAPS capsule TAKE 1 CAPSULE(0.4 MG) BY MOUTH DAILY AFTER BREAKFAST (Patient taking differently: Take 0.4 mg by mouth daily after breakfast. ) 90 capsule 3  . valACYclovir (VALTREX) 500 MG tablet TAKE 1 TABLET BY MOUTH DAILY 90 tablet 2  . vitamin E 200 UNIT capsule Take 200 Units by mouth daily.    [name] Kitchen apixaban (ELIQUIS) 2.5 MG TABS tablet Take  1 tablet (2.5 mg total) by mouth 2 (two) times daily. 180 tablet 3  . Lancets (ONETOUCH DELICA PLUS 123XX123) MISC 2 (two) times daily.    Glory Rosebush VERIO test strip TEST UP TO TWICE DAILY     No current facility-administered medications for this encounter.    REVIEW OF SYSTEMS:  On review of systems, the patient reports that he is doing well overall. He denies any chest pain, shortness of breath, cough, fevers, chills, night sweats, or unintended weight changes. He denies any bowel or bladder disturbances, and denies abdominal pain, nausea or vomiting. He reports that the left sided lower back pain has improved significantly since completing radiation.  He has mild persistent pain in the low back but nothing that requires pain medication at this point.  He is very pleased with his improvement. He denies radiation of pain into the buttocks or LEs and denies paraesthesias or focal weakness in the LEs. He reports that he tolerates the Xofigo infusions well and denies any significant adverse effects with those treatments.  A complete review of systems is obtained and is otherwise negative.  PHYSICAL EXAM:  Wt Readings from Last 3 Encounters:  08/23/19 200 lb 4 oz (90.8 kg)  08/17/19 217 lb 9.6 oz (98.7 kg)  07/19/19 222 lb 9.6 oz (101 kg)   Temp Readings from Last 3 Encounters:  08/23/19 (!) 97.4 F (36.3 C) (Temporal)  08/17/19 99.1 F (37.3 C) (Oral)  07/19/19 99.1 F (37.3 C) (Temporal)  BP Readings from Last 3 Encounters:  08/23/19 138/87  08/17/19 (!) 165/92  07/19/19 (!) 138/97   Pulse Readings from Last 3 Encounters:  08/23/19 (!) 113  08/17/19 (!) 102  07/19/19 (!) 107   Pain Assessment Pain Score: 9 /10  Unable to assess due to telephone follow up visit format.  KPS = 70  100 - Normal; no complaints; no evidence of disease. 90   - Able to carry on normal activity; minor signs or symptoms of disease. 80   - Normal activity with effort; some signs or symptoms of  disease. 53   - Cares for self; unable to carry on normal activity or to do active work. 60   - Requires occasional assistance, but is able to care for most of his personal needs. 50   - Requires considerable assistance and frequent medical care. 98   - Disabled; requires special care and assistance. 41   - Severely disabled; hospital admission is indicated although death not imminent. 43   - Very sick; hospital admission necessary; active supportive treatment necessary. 10   - Moribund; fatal processes progressing rapidly. 0     - Dead  Karnofsky DA, Abelmann Reynolds, Craver LS and Burchenal [facility] [phone]) The use of the nitrogen mustards in the palliative treatment of carcinoma: with particular reference to bronchogenic carcinoma Cancer 1 634-56  LABORATORY DATA:  Lab Results  Component Value Date   WBC 8.4 08/08/2019   HGB 10.2 (L) 08/08/2019   HCT 32.6 (L) 08/08/2019   MCV 79.5 (L) 08/08/2019   PLT 232 08/08/2019   Lab Results  Component Value Date   NA 141 07/19/2019   K 3.6 07/19/2019   CL 107 07/19/2019   CO2 20 (L) 07/19/2019   Lab Results  Component Value Date   ALT 11 07/19/2019   AST 14 (L) 07/19/2019   ALKPHOS 134 (H) 07/19/2019   BILITOT <0.2 (L) 07/19/2019     RADIOGRAPHY: NM XOFIGO INJECTION  Result Date: 08/15/2019  Trudi Ida was injected intravenously in Nuclear Medicine under the supervision of the attending radiologist      IMPRESSION/PLAN: . 1. 64 y.o. gentleman with metastatic castrate resistant prostate cancer with painful metastases to the lumbar spine.  He appears to have recovered from his recent palliative radiotherapy and reports significant improvement in the low back pain since completing treatment.  He is currently without complaints.  He is scheduled for his third of 6 Xofigo infusions on 09/08/2019 which he plans to continue with.  He will also continue in routine follow-up under the care and direction of Dr. Alen Blew and Dr. Tresa Moore for continued management  of his systemic disease.  He knows to call with any questions or concerns in the interim and is comfortable and in agreement with the stated plan.   Nicholos Johns, MMS, PA-C [facility] [place]: [phone]  Fax: [phone]

## 2019-08-25 ENCOUNTER — Other Ambulatory Visit: Payer: Self-pay

## 2019-08-25 ENCOUNTER — Ambulatory Visit (HOSPITAL_COMMUNITY): Payer: 59

## 2019-08-25 ENCOUNTER — Encounter (HOSPITAL_COMMUNITY): Payer: Self-pay

## 2019-08-25 DIAGNOSIS — R262 Difficulty in walking, not elsewhere classified: Secondary | ICD-10-CM

## 2019-08-25 DIAGNOSIS — M6281 Muscle weakness (generalized): Secondary | ICD-10-CM

## 2019-08-25 DIAGNOSIS — R2689 Other abnormalities of gait and mobility: Secondary | ICD-10-CM

## 2019-08-25 DIAGNOSIS — R2681 Unsteadiness on feet: Secondary | ICD-10-CM

## 2019-08-25 NOTE — Therapy (Signed)
[place], [place], [address] Phone: [phone]   Fax:  [phone]  Physical Therapy Treatment  Patient Details  Name: Jacob Joseph MRN: [id number] Date of Birth: [birth year] Referring Provider (PT): Velna Ochs, MD   Encounter Date: 08/25/2019  PT End of Session - 08/25/19 1325    Visit Number  11    Number of Visits  14    Date for PT Re-Evaluation  08/29/19    Authorization Type  [employer], Secondary: Medicaid    Authorization Time Period  06/20/19 to 07/31/18; NEW 08/01/19 to 08/29/19    Authorization - Visit Number  6    Authorization - Number of Visits  10    PT Start Time  [id number]   5' seated with MHP for pain control, not included wiht charges   PT Stop Time  1350    PT Time Calculation (min)  43 min    Equipment Utilized During Treatment  Gait belt   SPC   Activity Tolerance  Patient limited by pain;Patient tolerated treatment well;Patient limited by fatigue    Behavior During Therapy  [facility] for tasks assessed/performed       Past Medical History:  Diagnosis Date  . Abnormal computed tomography of thoracic spine 02/26/2018  . AKI (acute kidney injury) ([place]) 09/24/2016  . BPH associated with nocturia 06/19/2016  . Calcium oxalate crystals in urine 12/23/2011   Asymptomatic, no hematuria. Advised to take plenty of water.  . Calculus of gallbladder without cholecystitis without obstruction   . Closed fracture of proximal end of left humerus with delayed healing 05/26/2018  . COPD (chronic obstructive pulmonary disease) ([place])   . Depression   . Diabetes mellitus without complication ([place]) 123456   pre diabetic  . Hepatitis C   . HIV (human immunodeficiency virus infection) ([place])   . Hypertension   . Leukocytosis 09/01/2018  . PE (pulmonary thromboembolism) ([place]) 05/12/2016   PE in 2017, on chronic anticoagulation for this and recurrent DVT.  [name] Kitchen Peripheral arterial disease ([place])    ooccluded left SFA  by duplex ultrasound, tibial disease left greater than right  . Pressure injury of skin 01/13/2019  . Prostate cancer metastatic to multiple sites [facility])   . Right upper quadrant pain 02/26/2018  . Severe sepsis ([place]) 08/31/2018  . Stroke ([place]) 11/2011   Carotids Doppler negative. Right sided weakness resolved, initially on Plavix for 3 months and then continued with ASA.    . TB lung, latent 1988   Treated    Past Surgical History:  Procedure Laterality Date  . HERNIA REPAIR    . INGUINAL HERNIA REPAIR  01/16/2012   Procedure: HERNIA REPAIR INGUINAL INCARCERATED;  Surgeon: Zenovia Jarred, MD;  Location: [facility];  Service: General;  Laterality: Right;  . TEE WITHOUT CARDIOVERSION  12/07/2011   Procedure: TRANSESOPHAGEAL ECHOCARDIOGRAM (TEE);  Surgeon: Birdie Riddle, MD;  Location: [facility];  Service: Cardiovascular;  Laterality: N/A;    There were no vitals filed for this visit.  Subjective Assessment - 08/25/19 1316    Subjective  Pt stated he feels stronger today. Continues to be limited by fatigue and c/o LBP and new shoulder pain today, pain scale 7/10.  Reports he did the sit to stand HEP with band around knees and reports Rt knee pain relief today.  Went to cancer center earlier this week,   Stated they will determine return to radiation/chemo in next apt in March.  Pertinent History  Prostate cancer metastatic to multiple sites (LBP), Cancer treatment begins sometime December 2020    Patient Stated Goals  strengthen my legs so that I can stand and walk and stuff    Pain Score  7     Pain Location  Back    Pain Orientation  Lower    Pain Descriptors / Indicators  Aching    Pain Type  Chronic pain    Pain Radiating Towards  none    Pain Onset  1 to 4 weeks ago   Fall on 08/14/19   Pain Frequency  Constant    Aggravating Factors   Constant pain secondary to cancel metastases, but improving daily since radiation    Pain Relieving Factors  lying still    Effect of Pain on Daily  Activities  "not much I can do"                       [facility] Adult PT Treatment/Exercise - 08/25/19 0001      Knee/Hip Exercises: Stretches   Other Knee/Hip Stretches  LTR 5x 10"      Knee/Hip Exercises: Seated   Long Arc Quad  Both;10 reps;2 sets    [employer] Weight  4 lbs.    Marching  Both;10 reps;2 sets    Marching Weights  4 lbs.    Sit to Sand  2 sets;5 reps;without UE support      Knee/Hip Exercises: Supine   Bridges  Both;2 sets;10 reps    Other Supine Knee/Hip Exercises  clam 10x BLE      Modalities   Modalities  Moist Heat      Moist Heat Therapy   Number Minutes Moist Heat  30 Minutes    Moist Heat Location  Lumbar Spine   during seated and supine therex for pain control              PT Short Term Goals - 08/01/19 1309      PT SHORT TERM GOAL #1   Title  Pt will perform HEP correctly and consistently, at least 2x/week to improve ambulation tolerance and reduce incidence of falls.    Baseline  1/12: pt reports performing exercises 1x/day    Time  3    Period  Weeks    Status  Achieved    Target Date  07/11/19      PT SHORT TERM GOAL #2   Title  Pt will perform 30 sec STS at 5 reps with BUE assist and rollator to improve ability to rise from chair and move about house.    Baseline  1/12: 5 reps, BUE assist, rocking momentum, no increased LBP    Time  3    Period  Weeks    Status  Achieved      PT SHORT TERM GOAL #3   Title  Pt will complete 2MWT at 200 feet with LRAD, without seated rest break to demo improve cardiovascular endurance and muscle strength.    Baseline  1/12: 194ft with SPC, seated rest at 1 min 20 sec    Time  3    Period  Weeks    Status  On-going        PT Long Term Goals - 08/01/19 1310      PT LONG TERM GOAL #1   Title  Pt will score 19 on DGI to indicate reduction in risk for falls with mobility around the home and in  the community.    Baseline  1/12: 14 onDGI    Time  6    Period  Weeks    Status   On-going      PT LONG TERM GOAL #2   Title  Pt will perform 30 sec STS at 8 reps with BUE assist and rollator to improve ability to rise from chair and move about house.    Baseline  1/12: 5 reps, BUE assist, rocking momentum, no increased LBP    Time  6    Period  Weeks    Status  On-going      PT LONG TERM GOAL #3   Title  Pt will complete 2MWT at 350 feet with LRAD, without seated rest break to demo improve cardiovascular endurance and muscle strength.    Baseline  1/12: 173ft with SPC, seated rest at 1 min 20 sec    Time  6    Period  Weeks    Status  On-going      PT LONG TERM GOAL #4   Title  Pt will self report <10 falls since starting therapy to indicate improved strength and safety with mobility and improve overall quality of life.    Baseline  1/12: pt reports 2 falls since starting therapy    Time  6    Period  Weeks    Status  Achieved            Plan - 08/25/19 1339    Clinical Impression Statement  Pt reports increased endurance this session compared to last, able to ambulate into dept.  Pt continued to be limited by pain and fatigue.  MHP provided for pain control during seated and supine exercises.  Pt educated on log rolling to reduce stain on lumbar musculature.  Unable to complete standing exericses this session due to pain and fatigue.  Upon sitting pt c/o sharp chest pain momentarily, pt stated he thinks it gas.  Encouraged pt to call MD about chest pain, pt stated it has happened before.  Pt required ride in [facility] due to fatigue wiht activities complete.    Personal Factors and Comorbidities  Comorbidity 3+    Comorbidities  Prostate cancer metastatic to multiple sites (LBP), stroke 2013, COPD,    Examination-Activity Limitations  Bed Mobility;Locomotion Level;Stairs;Stand;Transfers    Examination-Participation Restrictions  Cleaning    Clinical Decision Making  Moderate    Rehab Potential  Fair    PT Frequency  2x / week    PT Duration  4 weeks    PT  Treatment/Interventions  ADLs/Self Care Home Management;Aquatic Therapy;Cryotherapy;DME Instruction;Gait training;Stair training;Functional mobility training;Therapeutic activities;Therapeutic exercise;Balance training;Neuromuscular re-education;Patient/family education;Orthotic Fit/Training;Manual techniques;Passive range of motion;Dry needling;Energy conservation;Joint Manipulations    PT Next Visit Plan  Reassess next session for probable DC. Avoid positions that increase LBP due to prostate cance metastases.  Respect pt.'s fatigue and pain levels.    PT Home Exercise Plan  Eval: seated heel raises, seated toe raises, STS; 12/8: seated abduction with GTB; 1/12: STS       Patient will benefit from skilled therapeutic intervention in order to improve the following deficits and impairments:  Abnormal gait, Cardiopulmonary status limiting activity, Decreased activity tolerance, Decreased balance, Decreased endurance, Decreased mobility, Decreased strength, Difficulty walking, Pain  Visit Diagnosis: Muscle weakness (generalized)  Other abnormalities of gait and mobility  Unsteadiness on feet  Difficulty in walking, not elsewhere classified     Problem List Patient Active Problem List  Diagnosis Date Noted  . Xerosis of skin 02/26/2019  . History of angioedema 01/10/2019  . Spine metastasis ([place]) 03/24/2018  . History of DVT (deep vein thrombosis)   . Prostate cancer metastatic to multiple sites [facility])   . Radicular pain of thoracic region   . Chronic anticoagulation   . Recurrent falls 11/02/2016  . Substance abuse ([place]) 01/27/2016  . Vitamin D deficiency 12/05/2015  . Hyperlipidemia 01/28/2015  . Cataracts, bilateral 08/29/2014  . Diabetes mellitus treated with oral medication ([place]) 10/19/2013  . COPD, moderate ([place]) 05/31/2013  . Complex Lt Renal Cyst 03/22/2013  . Preventive measure 05/30/2012  . History of CVA 03/08/2012  . Normocytic anemia 03/08/2012  . CKD (chronic  kidney disease) stage 3, GFR 30-59 ml/min ([facility]) 03/08/2012  . ERECTILE DYSFUNCTION 04/25/2008  . HIV disease ([place]) 04/27/2006  . Smoking greater than 30 pack years 04/27/2006  . Major depressive disorder, recurrent episode, in full remission ([place]) 04/27/2006  . Hypertension 04/27/2006  . DEGENERATIVE JOINT DISEASE 04/27/2006  . Chronic Low Back Pain 04/27/2006   Ihor Austin, LPTA; CBIS [phone]  Aldona Lento 08/25/2019, 3:08 PM  [place] [place] [address] [place], [place], [address] Phone: [phone]   Fax:  [phone]  Name: Jacob Joseph MRN: [id number] Date of Birth: [birth year]

## 2019-08-29 MED ORDER — DILTIAZEM HCL ER 120 MG PO CP24: ORAL_CAPSULE | ORAL | 3 refills | Status: AC

## 2019-08-30 ENCOUNTER — Telehealth (HOSPITAL_COMMUNITY): Payer: Self-pay | Admitting: Physical Therapy

## 2019-08-30 ENCOUNTER — Ambulatory Visit (HOSPITAL_COMMUNITY): Payer: 59 | Admitting: Physical Therapy

## 2019-08-30 NOTE — Telephone Encounter (Signed)
Patient no show this morning, contacted patient and he stated he contacted clinic this afternoon. He is aware of next appointment.   4:48 PM, 08/30/19 Mearl Latin PT, DPT Physical Therapist at [facility]

## 2019-09-04 NOTE — Telephone Encounter (Signed)
Ready to close out 

## 2019-09-06 ENCOUNTER — Other Ambulatory Visit: Payer: Self-pay | Admitting: Radiation Oncology

## 2019-09-06 DIAGNOSIS — C7951 Secondary malignant neoplasm of bone: Secondary | ICD-10-CM

## 2019-09-07 ENCOUNTER — Telehealth: Payer: Self-pay | Admitting: *Deleted

## 2019-09-07 NOTE — Telephone Encounter (Signed)
Called patient to remind of lab and weight for 09-08-19 @ 12 pm @ [facility], lvm for a return call

## 2019-09-08 ENCOUNTER — Telehealth (HOSPITAL_COMMUNITY): Payer: Self-pay | Admitting: Physical Therapy

## 2019-09-08 ENCOUNTER — Telehealth: Payer: Self-pay | Admitting: *Deleted

## 2019-09-08 ENCOUNTER — Ambulatory Visit (HOSPITAL_COMMUNITY): Payer: 59 | Admitting: Physical Therapy

## 2019-09-08 ENCOUNTER — Ambulatory Visit: Payer: 59

## 2019-09-08 DIAGNOSIS — Z510 Encounter for antineoplastic radiation therapy: Secondary | ICD-10-CM | POA: Insufficient documentation

## 2019-09-08 DIAGNOSIS — C61 Malignant neoplasm of prostate: Secondary | ICD-10-CM | POA: Insufficient documentation

## 2019-09-08 DIAGNOSIS — C7952 Secondary malignant neoplasm of bone marrow: Secondary | ICD-10-CM | POA: Insufficient documentation

## 2019-09-08 DIAGNOSIS — C7951 Secondary malignant neoplasm of bone: Secondary | ICD-10-CM | POA: Insufficient documentation

## 2019-09-08 NOTE — Telephone Encounter (Signed)
pt called to cancel today's appt due to no transportation and the weather

## 2019-09-08 NOTE — Telephone Encounter (Signed)
CALLED PATIENT TO ASK ABOUT COMING TODAY FOR LABS, LVM FOR A RETURN CALL

## 2019-09-11 ENCOUNTER — Ambulatory Visit: Payer: 59

## 2019-09-11 ENCOUNTER — Telehealth: Payer: Self-pay | Admitting: Nutrition

## 2019-09-11 ENCOUNTER — Telehealth: Payer: Self-pay | Admitting: Medical Oncology

## 2019-09-11 ENCOUNTER — Telehealth: Payer: Self-pay | Admitting: *Deleted

## 2019-09-11 NOTE — Telephone Encounter (Signed)
Called patient to inform him , [name] has attempted to reach him to schedule labs for [facility]. He states he just hung up from speaking with her. He states he is not sure of transportation. He is aware of our transportation services, having used them in the past. [name] is working to arrange. He states he is doing ok with the [employer]. Encouraged him to call me if I can assist him or answer questions. He voiced understanding.

## 2019-09-11 NOTE — Telephone Encounter (Signed)
Patient contacted me and left me a message that he wishes to pick up oral nutrition supplements samples.  I returned patient's call to get more details on what type of supplements he would like and to make arrangements for pickup.  I am waiting a return call.

## 2019-09-11 NOTE — Telephone Encounter (Signed)
CALLED PATIENT TO ASK ABOUT COMING IN FOR LAB ON 09-11-19, LVM FOR  A RETURN CALL

## 2019-09-12 ENCOUNTER — Other Ambulatory Visit: Payer: Self-pay

## 2019-09-12 ENCOUNTER — Ambulatory Visit
Admission: RE | Admit: 2019-09-12 | Discharge: 2019-09-12 | Disposition: A | Discharge status: Home / Self Care | Payer: 59 | Source: Ambulatory Visit | Attending: Radiation Oncology | Admitting: Radiation Oncology

## 2019-09-12 DIAGNOSIS — C61 Malignant neoplasm of prostate: Secondary | ICD-10-CM | POA: Diagnosis not present

## 2019-09-12 DIAGNOSIS — Z510 Encounter for antineoplastic radiation therapy: Secondary | ICD-10-CM | POA: Diagnosis present

## 2019-09-12 DIAGNOSIS — C7951 Secondary malignant neoplasm of bone: Secondary | ICD-10-CM | POA: Diagnosis not present

## 2019-09-12 DIAGNOSIS — C7952 Secondary malignant neoplasm of bone marrow: Secondary | ICD-10-CM | POA: Diagnosis present

## 2019-09-12 LAB — CBC WITH DIFFERENTIAL (CANCER CENTER ONLY)
Abs Immature Granulocytes: 0.07 10*3/uL (ref 0.00–0.07)
Basophils Absolute: 0 10*3/uL (ref 0.0–0.1)
Basophils Relative: 0 %
Eosinophils Absolute: 0.2 10*3/uL (ref 0.0–0.5)
Eosinophils Relative: 2 %
HCT: 29.7 % — ABNORMAL LOW (ref 39.0–52.0)
Hemoglobin: 9.5 g/dL — ABNORMAL LOW (ref 13.0–17.0)
Immature Granulocytes: 1 %
Lymphocytes Relative: 10 %
Lymphs Abs: 1 10*3/uL (ref 0.7–4.0)
MCH: 25.1 pg — ABNORMAL LOW (ref 26.0–34.0)
MCHC: 32 g/dL (ref 30.0–36.0)
MCV: 78.6 fL — ABNORMAL LOW (ref 80.0–100.0)
Monocytes Absolute: 0.7 10*3/uL (ref 0.1–1.0)
Monocytes Relative: 8 %
Neutro Abs: 7.6 10*3/uL (ref 1.7–7.7)
Neutrophils Relative %: 79 %
Platelet Count: 317 10*3/uL (ref 150–400)
RBC: 3.78 MIL/uL — ABNORMAL LOW (ref 4.22–5.81)
RDW: 17.7 % — ABNORMAL HIGH (ref 11.5–15.5)
WBC Count: 9.5 10*3/uL (ref 4.0–10.5)
nRBC: 0 % (ref 0.0–0.2)

## 2019-09-13 ENCOUNTER — Telehealth (HOSPITAL_COMMUNITY): Payer: Self-pay | Admitting: Physical Therapy

## 2019-09-13 NOTE — Telephone Encounter (Signed)
pt can't come tomorrow because he does not have a ride

## 2019-09-14 ENCOUNTER — Ambulatory Visit (HOSPITAL_COMMUNITY): Payer: 59 | Admitting: Physical Therapy

## 2019-09-14 ENCOUNTER — Telehealth: Payer: Self-pay | Admitting: *Deleted

## 2019-09-14 NOTE — Telephone Encounter (Signed)
CALLED PATIENT TO REMIND OF [employer] INJ. FOR 09-15-19 - ARRIVAL TIME- 1:45 PM @ [facility], SPOKE WITH PATIENT AND HE IS AWARE OF THIS INJ.

## 2019-09-15 ENCOUNTER — Other Ambulatory Visit: Payer: Self-pay

## 2019-09-15 ENCOUNTER — Encounter: Payer: Self-pay | Admitting: Nutrition

## 2019-09-15 ENCOUNTER — Ambulatory Visit (HOSPITAL_COMMUNITY)
Admission: RE | Admit: 2019-09-15 | Discharge: 2019-09-15 | Disposition: A | Discharge status: Home / Self Care | Payer: 59 | Source: Ambulatory Visit | Attending: Radiation Oncology | Admitting: Radiation Oncology

## 2019-09-15 DIAGNOSIS — C7951 Secondary malignant neoplasm of bone: Secondary | ICD-10-CM | POA: Insufficient documentation

## 2019-09-15 DIAGNOSIS — C61 Malignant neoplasm of prostate: Secondary | ICD-10-CM | POA: Diagnosis not present

## 2019-09-15 MED ORDER — RADIUM RA 223 DICHLORIDE 30 MCCI/ML IV SOLN
137.4000 | Freq: Once | INTRAVENOUS | Status: AC | PRN
  Administered 2019-09-15: 137.4 via INTRAVENOUS

## 2019-09-15 NOTE — Progress Notes (Signed)
Provided one case of ensure enlive.

## 2019-09-18 ENCOUNTER — Ambulatory Visit (HOSPITAL_COMMUNITY): Payer: 59

## 2019-09-19 ENCOUNTER — Other Ambulatory Visit: Payer: Self-pay

## 2019-09-19 ENCOUNTER — Encounter: Payer: Self-pay | Admitting: Podiatry

## 2019-09-19 ENCOUNTER — Ambulatory Visit (INDEPENDENT_AMBULATORY_CARE_PROVIDER_SITE_OTHER): Payer: 59 | Admitting: Podiatry

## 2019-09-19 DIAGNOSIS — B351 Tinea unguium: Secondary | ICD-10-CM

## 2019-09-19 DIAGNOSIS — E1151 Type 2 diabetes mellitus with diabetic peripheral angiopathy without gangrene: Secondary | ICD-10-CM

## 2019-09-19 DIAGNOSIS — M79676 Pain in unspecified toe(s): Secondary | ICD-10-CM

## 2019-09-19 NOTE — Patient Instructions (Signed)
Diabetes Mellitus and Foot Care Foot care is an important part of your health, especially when you have diabetes. Diabetes may cause you to have problems because of poor blood flow (circulation) to your feet and legs, which can cause your skin to:  Become thinner and drier.  Break more easily.  Heal more slowly.  Peel and crack. You may also have nerve damage (neuropathy) in your legs and feet, causing decreased feeling in them. This means that you may not notice minor injuries to your feet that could lead to more serious problems. Noticing and addressing any potential problems early is the best way to prevent future foot problems. How to care for your feet Foot hygiene  Wash your feet daily with warm water and mild soap. Do not use hot water. Then, pat your feet and the areas between your toes until they are completely dry. Do not soak your feet as this can dry your skin.  Trim your toenails straight across. Do not dig under them or around the cuticle. File the edges of your nails with an emery board or nail file.  Apply a moisturizing lotion or petroleum jelly to the skin on your feet and to dry, brittle toenails. Use lotion that does not contain alcohol and is unscented. Do not apply lotion between your toes. Shoes and socks  Wear clean socks or stockings every day. Make sure they are not too tight. Do not wear knee-high stockings since they may decrease blood flow to your legs.  Wear shoes that fit properly and have enough cushioning. Always look in your shoes before you put them on to be sure there are no objects inside.  To break in new shoes, wear them for just a few hours a day. This prevents injuries on your feet. Wounds, scrapes, corns, and calluses  Check your feet daily for blisters, cuts, bruises, sores, and redness. If you cannot see the bottom of your feet, use a mirror or ask someone for help.  Do not cut corns or calluses or try to remove them with medicine.  If you  find a minor scrape, cut, or break in the skin on your feet, keep it and the skin around it clean and dry. You may clean these areas with mild soap and water. Do not clean the area with peroxide, alcohol, or iodine.  If you have a wound, scrape, corn, or callus on your foot, look at it several times a day to make sure it is healing and not infected. Check for: ? Redness, swelling, or pain. ? Fluid or blood. ? Warmth. ? Pus or a bad smell. General instructions  Do not cross your legs. This may decrease blood flow to your feet.  Do not use heating pads or hot water bottles on your feet. They may burn your skin. If you have lost feeling in your feet or legs, you may not know this is happening until it is too late.  Protect your feet from hot and cold by wearing shoes, such as at the beach or on hot pavement.  Schedule a complete foot exam at least once a year (annually) or more often if you have foot problems. If you have foot problems, report any cuts, sores, or bruises to your health care provider immediately. Contact a health care provider if:  You have a medical condition that increases your risk of infection and you have any cuts, sores, or bruises on your feet.  You have an injury that is not   healing.  You have redness on your legs or feet.  You feel burning or tingling in your legs or feet.  You have pain or cramps in your legs and feet.  Your legs or feet are numb.  Your feet always feel cold.  You have pain around a toenail. Get help right away if:  You have a wound, scrape, corn, or callus on your foot and: ? You have pain, swelling, or redness that gets worse. ? You have fluid or blood coming from the wound, scrape, corn, or callus. ? Your wound, scrape, corn, or callus feels warm to the touch. ? You have pus or a bad smell coming from the wound, scrape, corn, or callus. ? You have a fever. ? You have a red line going up your leg. Summary  Check your feet every day  for cuts, sores, red spots, swelling, and blisters.  Moisturize feet and legs daily.  Wear shoes that fit properly and have enough cushioning.  If you have foot problems, report any cuts, sores, or bruises to your health care provider immediately.  Schedule a complete foot exam at least once a year (annually) or more often if you have foot problems. This information is not intended to replace advice given to you by your health care provider. Make sure you discuss any questions you have with your health care provider. Document Revised: 03/29/2019 Document Reviewed: 08/07/2016 Elsevier Patient Education  2020 [employer]  

## 2019-09-20 ENCOUNTER — Telehealth (HOSPITAL_COMMUNITY): Payer: Self-pay | Admitting: Physical Therapy

## 2019-09-20 ENCOUNTER — Ambulatory Visit (HOSPITAL_COMMUNITY): Payer: Medicaid Other | Admitting: Physical Therapy

## 2019-09-20 NOTE — Telephone Encounter (Signed)
pt lmonvm to cx this appt could not understand the reason why or the full message.

## 2019-09-21 ENCOUNTER — Ambulatory Visit (HOSPITAL_COMMUNITY): Payer: Medicaid Other | Admitting: Physical Therapy

## 2019-09-24 ENCOUNTER — Other Ambulatory Visit: Payer: Self-pay

## 2019-09-24 ENCOUNTER — Encounter (HOSPITAL_COMMUNITY): Payer: Self-pay | Admitting: Emergency Medicine

## 2019-09-24 ENCOUNTER — Inpatient Hospital Stay (HOSPITAL_COMMUNITY)
Admission: EM | Admit: 2019-09-24 | Discharge: 2019-09-28 | DRG: 190 | Disposition: A | Discharge status: Hospice | Payer: 59 | Attending: Internal Medicine | Admitting: Internal Medicine

## 2019-09-24 ENCOUNTER — Emergency Department (HOSPITAL_COMMUNITY): Payer: 59

## 2019-09-24 DIAGNOSIS — C7951 Secondary malignant neoplasm of bone: Secondary | ICD-10-CM | POA: Diagnosis present

## 2019-09-24 DIAGNOSIS — H02402 Unspecified ptosis of left eyelid: Secondary | ICD-10-CM | POA: Diagnosis present

## 2019-09-24 DIAGNOSIS — N401 Enlarged prostate with lower urinary tract symptoms: Secondary | ICD-10-CM | POA: Diagnosis present

## 2019-09-24 DIAGNOSIS — F17210 Nicotine dependence, cigarettes, uncomplicated: Secondary | ICD-10-CM | POA: Diagnosis present

## 2019-09-24 DIAGNOSIS — J9621 Acute and chronic respiratory failure with hypoxia: Secondary | ICD-10-CM

## 2019-09-24 DIAGNOSIS — Z21 Asymptomatic human immunodeficiency virus [HIV] infection status: Secondary | ICD-10-CM | POA: Diagnosis present

## 2019-09-24 DIAGNOSIS — Z20822 Contact with and (suspected) exposure to covid-19: Secondary | ICD-10-CM | POA: Diagnosis present

## 2019-09-24 DIAGNOSIS — Z79891 Long term (current) use of opiate analgesic: Secondary | ICD-10-CM

## 2019-09-24 DIAGNOSIS — B20 Human immunodeficiency virus [HIV] disease: Secondary | ICD-10-CM | POA: Diagnosis not present

## 2019-09-24 DIAGNOSIS — Z66 Do not resuscitate: Secondary | ICD-10-CM | POA: Diagnosis not present

## 2019-09-24 DIAGNOSIS — E1151 Type 2 diabetes mellitus with diabetic peripheral angiopathy without gangrene: Secondary | ICD-10-CM | POA: Diagnosis present

## 2019-09-24 DIAGNOSIS — I129 Hypertensive chronic kidney disease with stage 1 through stage 4 chronic kidney disease, or unspecified chronic kidney disease: Secondary | ICD-10-CM | POA: Diagnosis present

## 2019-09-24 DIAGNOSIS — N1831 Chronic kidney disease, stage 3a: Secondary | ICD-10-CM | POA: Diagnosis present

## 2019-09-24 DIAGNOSIS — Z79899 Other long term (current) drug therapy: Secondary | ICD-10-CM

## 2019-09-24 DIAGNOSIS — B1920 Unspecified viral hepatitis C without hepatic coma: Secondary | ICD-10-CM | POA: Diagnosis present

## 2019-09-24 DIAGNOSIS — H269 Unspecified cataract: Secondary | ICD-10-CM | POA: Diagnosis present

## 2019-09-24 DIAGNOSIS — Z86718 Personal history of other venous thrombosis and embolism: Secondary | ICD-10-CM | POA: Diagnosis not present

## 2019-09-24 DIAGNOSIS — F3130 Bipolar disorder, current episode depressed, mild or moderate severity, unspecified: Secondary | ICD-10-CM | POA: Diagnosis present

## 2019-09-24 DIAGNOSIS — E785 Hyperlipidemia, unspecified: Secondary | ICD-10-CM | POA: Diagnosis present

## 2019-09-24 DIAGNOSIS — F1910 Other psychoactive substance abuse, uncomplicated: Secondary | ICD-10-CM | POA: Diagnosis present

## 2019-09-24 DIAGNOSIS — I10 Essential (primary) hypertension: Secondary | ICD-10-CM | POA: Diagnosis not present

## 2019-09-24 DIAGNOSIS — E1122 Type 2 diabetes mellitus with diabetic chronic kidney disease: Secondary | ICD-10-CM | POA: Diagnosis present

## 2019-09-24 DIAGNOSIS — Z86711 Personal history of pulmonary embolism: Secondary | ICD-10-CM

## 2019-09-24 DIAGNOSIS — Z7189 Other specified counseling: Secondary | ICD-10-CM | POA: Diagnosis not present

## 2019-09-24 DIAGNOSIS — D649 Anemia, unspecified: Secondary | ICD-10-CM | POA: Diagnosis present

## 2019-09-24 DIAGNOSIS — E119 Type 2 diabetes mellitus without complications: Secondary | ICD-10-CM

## 2019-09-24 DIAGNOSIS — Z515 Encounter for palliative care: Secondary | ICD-10-CM

## 2019-09-24 DIAGNOSIS — C61 Malignant neoplasm of prostate: Secondary | ICD-10-CM | POA: Diagnosis present

## 2019-09-24 DIAGNOSIS — Z8249 Family history of ischemic heart disease and other diseases of the circulatory system: Secondary | ICD-10-CM

## 2019-09-24 DIAGNOSIS — F1410 Cocaine abuse, uncomplicated: Secondary | ICD-10-CM | POA: Diagnosis present

## 2019-09-24 DIAGNOSIS — R079 Chest pain, unspecified: Secondary | ICD-10-CM

## 2019-09-24 DIAGNOSIS — G939 Disorder of brain, unspecified: Secondary | ICD-10-CM | POA: Diagnosis present

## 2019-09-24 DIAGNOSIS — Z7952 Long term (current) use of systemic steroids: Secondary | ICD-10-CM

## 2019-09-24 DIAGNOSIS — E559 Vitamin D deficiency, unspecified: Secondary | ICD-10-CM | POA: Diagnosis present

## 2019-09-24 DIAGNOSIS — Z823 Family history of stroke: Secondary | ICD-10-CM

## 2019-09-24 DIAGNOSIS — E1136 Type 2 diabetes mellitus with diabetic cataract: Secondary | ICD-10-CM | POA: Diagnosis present

## 2019-09-24 DIAGNOSIS — Z809 Family history of malignant neoplasm, unspecified: Secondary | ICD-10-CM

## 2019-09-24 DIAGNOSIS — J441 Chronic obstructive pulmonary disease with (acute) exacerbation: Principal | ICD-10-CM | POA: Diagnosis present

## 2019-09-24 DIAGNOSIS — Z8673 Personal history of transient ischemic attack (TIA), and cerebral infarction without residual deficits: Secondary | ICD-10-CM | POA: Diagnosis not present

## 2019-09-24 DIAGNOSIS — Z7984 Long term (current) use of oral hypoglycemic drugs: Secondary | ICD-10-CM

## 2019-09-24 DIAGNOSIS — Z7982 Long term (current) use of aspirin: Secondary | ICD-10-CM

## 2019-09-24 DIAGNOSIS — N183 Chronic kidney disease, stage 3 unspecified: Secondary | ICD-10-CM | POA: Diagnosis present

## 2019-09-24 LAB — CBC
HCT: 27 % — ABNORMAL LOW (ref 39.0–52.0)
Hemoglobin: 8.6 g/dL — ABNORMAL LOW (ref 13.0–17.0)
MCH: 24.9 pg — ABNORMAL LOW (ref 26.0–34.0)
MCHC: 31.9 g/dL (ref 30.0–36.0)
MCV: 78.3 fL — ABNORMAL LOW (ref 80.0–100.0)
Platelets: 290 10*3/uL (ref 150–400)
RBC: 3.45 MIL/uL — ABNORMAL LOW (ref 4.22–5.81)
RDW: 17.4 % — ABNORMAL HIGH (ref 11.5–15.5)
WBC: 9.4 10*3/uL (ref 4.0–10.5)
nRBC: 0 % (ref 0.0–0.2)

## 2019-09-24 LAB — BASIC METABOLIC PANEL
Anion gap: 9 (ref 5–15)
BUN: 17 mg/dL (ref 8–23)
CO2: 23 mmol/L (ref 22–32)
Calcium: 9.2 mg/dL (ref 8.9–10.3)
Chloride: 102 mmol/L (ref 98–111)
Creatinine, Ser: 1.09 mg/dL (ref 0.61–1.24)
GFR calc Af Amer: >60 mL/min (ref >60)
GFR calc non Af Amer: >60 mL/min (ref >60)
Glucose, Bld: 165 mg/dL — ABNORMAL HIGH (ref 70–99)
Potassium: 4.7 mmol/L (ref 3.5–5.1)
Sodium: 134 mmol/L — ABNORMAL LOW (ref 135–145)

## 2019-09-24 LAB — TROPONIN I (HIGH SENSITIVITY)
Troponin I (High Sensitivity): 4 ng/L (ref <18)
Troponin I (High Sensitivity): 4 ng/L (ref <18)

## 2019-09-24 LAB — RESPIRATORY PANEL BY RT PCR (FLU A&B, COVID)
Influenza A by PCR: NEGATIVE
Influenza B by PCR: NEGATIVE
SARS Coronavirus 2 by RT PCR: NEGATIVE

## 2019-09-24 LAB — GLUCOSE, CAPILLARY
Glucose-Capillary: 185 mg/dL — ABNORMAL HIGH (ref 70–99)
Glucose-Capillary: 193 mg/dL — ABNORMAL HIGH (ref 70–99)

## 2019-09-24 LAB — LACTIC ACID, PLASMA
Lactic Acid, Venous: 1.2 mmol/L (ref 0.5–1.9)
Lactic Acid, Venous: 1 mmol/L (ref 0.5–1.9)

## 2019-09-24 LAB — RAPID URINE DRUG SCREEN, HOSP PERFORMED
Amphetamines: NOT DETECTED
Barbiturates: NOT DETECTED
Benzodiazepines: NOT DETECTED
Cocaine: POSITIVE — AB
Opiates: POSITIVE — AB
Tetrahydrocannabinol: NOT DETECTED

## 2019-09-24 LAB — POC OCCULT BLOOD, ED: Fecal Occult Bld: NEGATIVE

## 2019-09-24 LAB — D-DIMER, QUANTITATIVE: D-Dimer, Quant: 4.36 ug/mL-FEU — ABNORMAL HIGH (ref 0.00–0.50)

## 2019-09-24 MED ORDER — OXYCODONE HCL 5 MG PO TABS
7.5000 mg | ORAL_TABLET | ORAL | Status: DC | PRN
  Administered 2019-09-24 – 2019-09-26 (×6): 7.5 mg via ORAL
  Filled 2019-09-24 (×6): qty 2

## 2019-09-24 MED ORDER — ONDANSETRON HCL 4 MG/2ML IJ SOLN: 4.0000 mg | Freq: Four times a day (QID) | INTRAMUSCULAR | Status: DC | PRN

## 2019-09-24 MED ORDER — SODIUM CHLORIDE 0.9 % IV SOLN: 250.0000 mL | INTRAVENOUS | Status: DC | PRN

## 2019-09-24 MED ORDER — ASPIRIN EC 81 MG PO TBEC
81.0000 mg | DELAYED_RELEASE_TABLET | Freq: Every day | ORAL | Status: DC
  Administered 2019-09-25 – 2019-09-28 (×4): 81 mg via ORAL
  Filled 2019-09-24 (×4): qty 1

## 2019-09-24 MED ORDER — QUETIAPINE FUMARATE 100 MG PO TABS
400.0000 mg | ORAL_TABLET | Freq: Every day | ORAL | Status: DC
  Administered 2019-09-24 – 2019-09-27 (×4): 400 mg via ORAL
  Filled 2019-09-24 (×4): qty 4

## 2019-09-24 MED ORDER — ACETAMINOPHEN 325 MG PO TABS
650.0000 mg | ORAL_TABLET | Freq: Four times a day (QID) | ORAL | Status: DC
  Administered 2019-09-24 – 2019-09-28 (×15): 650 mg via ORAL
  Filled 2019-09-24 (×16): qty 2

## 2019-09-24 MED ORDER — AZELASTINE HCL 0.1 % NA SOLN
1.0000 | Freq: Two times a day (BID) | NASAL | Status: DC | PRN
  Filled 2019-09-24: qty 30

## 2019-09-24 MED ORDER — INSULIN ASPART 100 UNIT/ML ~~LOC~~ SOLN
0.0000 [IU] | Freq: Every day | SUBCUTANEOUS | Status: DC
  Administered 2019-09-26: 2 [IU] via SUBCUTANEOUS

## 2019-09-24 MED ORDER — QUETIAPINE FUMARATE 25 MG PO TABS: 50.0000 mg | ORAL_TABLET | Freq: Three times a day (TID) | ORAL | Status: DC | PRN

## 2019-09-24 MED ORDER — CITALOPRAM HYDROBROMIDE 20 MG PO TABS
30.0000 mg | ORAL_TABLET | Freq: Every day | ORAL | Status: DC
  Administered 2019-09-24 – 2019-09-28 (×5): 30 mg via ORAL
  Filled 2019-09-24 (×5): qty 1

## 2019-09-24 MED ORDER — HYDROXYZINE HCL 25 MG PO TABS
50.0000 mg | ORAL_TABLET | Freq: Every day | ORAL | Status: DC
  Administered 2019-09-24 – 2019-09-27 (×4): 50 mg via ORAL
  Filled 2019-09-24 (×3): qty 2
  Filled 2019-09-24: qty 1
  Filled 2019-09-24: qty 2

## 2019-09-24 MED ORDER — FUROSEMIDE 10 MG/ML IJ SOLN
30.0000 mg | Freq: Every day | INTRAMUSCULAR | Status: DC
  Administered 2019-09-24 – 2019-09-28 (×5): 30 mg via INTRAVENOUS
  Filled 2019-09-24 (×5): qty 4

## 2019-09-24 MED ORDER — GABAPENTIN 300 MG PO CAPS
600.0000 mg | ORAL_CAPSULE | Freq: Two times a day (BID) | ORAL | Status: DC
  Administered 2019-09-24 – 2019-09-28 (×8): 600 mg via ORAL
  Filled 2019-09-24: qty 2
  Filled 2019-09-24: qty 6
  Filled 2019-09-24 (×6): qty 2

## 2019-09-24 MED ORDER — SODIUM CHLORIDE 0.9% FLUSH
3.0000 mL | Freq: Two times a day (BID) | INTRAVENOUS | Status: DC
  Administered 2019-09-24 – 2019-09-28 (×8): 3 mL via INTRAVENOUS

## 2019-09-24 MED ORDER — NICOTINE 21 MG/24HR TD PT24
21.0000 mg | MEDICATED_PATCH | Freq: Every day | TRANSDERMAL | Status: DC | PRN
  Administered 2019-09-25 – 2019-09-28 (×3): 21 mg via TRANSDERMAL
  Filled 2019-09-24 (×3): qty 1

## 2019-09-24 MED ORDER — PANTOPRAZOLE SODIUM 40 MG PO TBEC
40.0000 mg | DELAYED_RELEASE_TABLET | Freq: Every day | ORAL | Status: DC
  Administered 2019-09-25 – 2019-09-28 (×4): 40 mg via ORAL
  Filled 2019-09-24 (×4): qty 1

## 2019-09-24 MED ORDER — LORAZEPAM 0.5 MG PO TABS
0.5000 mg | ORAL_TABLET | Freq: Four times a day (QID) | ORAL | Status: DC | PRN
  Administered 2019-09-25: 0.5 mg via ORAL
  Filled 2019-09-24: qty 1

## 2019-09-24 MED ORDER — IPRATROPIUM-ALBUTEROL 0.5-2.5 (3) MG/3ML IN SOLN
3.0000 mL | Freq: Once | RESPIRATORY_TRACT | Status: AC
  Administered 2019-09-24: 3 mL via RESPIRATORY_TRACT
  Filled 2019-09-24: qty 3

## 2019-09-24 MED ORDER — APIXABAN 2.5 MG PO TABS
2.5000 mg | ORAL_TABLET | Freq: Two times a day (BID) | ORAL | Status: DC
  Administered 2019-09-24 – 2019-09-28 (×8): 2.5 mg via ORAL
  Filled 2019-09-24 (×8): qty 1

## 2019-09-24 MED ORDER — IOHEXOL 350 MG/ML SOLN
100.0000 mL | Freq: Once | INTRAVENOUS | Status: AC | PRN
  Administered 2019-09-24: 100 mL via INTRAVENOUS

## 2019-09-24 MED ORDER — TAMSULOSIN HCL 0.4 MG PO CAPS
0.4000 mg | ORAL_CAPSULE | Freq: Every day | ORAL | Status: DC
  Administered 2019-09-25 – 2019-09-28 (×4): 0.4 mg via ORAL
  Filled 2019-09-24 (×4): qty 1

## 2019-09-24 MED ORDER — TRAZODONE HCL 50 MG PO TABS
50.0000 mg | ORAL_TABLET | Freq: Every evening | ORAL | Status: DC | PRN
  Administered 2019-09-25: 50 mg via ORAL
  Filled 2019-09-24: qty 1

## 2019-09-24 MED ORDER — VALACYCLOVIR HCL 500 MG PO TABS
500.0000 mg | ORAL_TABLET | Freq: Every day | ORAL | Status: DC
  Administered 2019-09-24 – 2019-09-28 (×5): 500 mg via ORAL
  Filled 2019-09-24 (×5): qty 1

## 2019-09-24 MED ORDER — MORPHINE SULFATE (PF) 4 MG/ML IV SOLN
4.0000 mg | Freq: Once | INTRAVENOUS | Status: AC
  Administered 2019-09-24: 4 mg via INTRAVENOUS
  Filled 2019-09-24: qty 1

## 2019-09-24 MED ORDER — VARENICLINE TARTRATE 0.5 MG PO TABS
0.5000 mg | ORAL_TABLET | Freq: Every day | ORAL | Status: DC
  Administered 2019-09-24 – 2019-09-28 (×5): 0.5 mg via ORAL
  Filled 2019-09-24 (×6): qty 1

## 2019-09-24 MED ORDER — AEROCHAMBER PLUS FLO-VU MEDIUM MISC
1.0000 | Freq: Once | Status: AC
  Administered 2019-09-24: 1
  Filled 2019-09-24: qty 1

## 2019-09-24 MED ORDER — DILTIAZEM HCL ER 120 MG PO CP24
120.0000 mg | ORAL_CAPSULE | Freq: Every day | ORAL | Status: DC
  Administered 2019-09-24 – 2019-09-28 (×5): 120 mg via ORAL
  Filled 2019-09-24 (×10): qty 1

## 2019-09-24 MED ORDER — ALBUTEROL SULFATE HFA 108 (90 BASE) MCG/ACT IN AERS
4.0000 | INHALATION_SPRAY | Freq: Once | RESPIRATORY_TRACT | Status: AC
  Administered 2019-09-24: 4 via RESPIRATORY_TRACT
  Filled 2019-09-24: qty 6.7

## 2019-09-24 MED ORDER — IPRATROPIUM-ALBUTEROL 0.5-2.5 (3) MG/3ML IN SOLN: 3.0000 mL | RESPIRATORY_TRACT | Status: DC | PRN

## 2019-09-24 MED ORDER — VITAMIN D 25 MCG (1000 UNIT) PO TABS
2000.0000 [IU] | ORAL_TABLET | Freq: Every day | ORAL | Status: DC
  Administered 2019-09-24 – 2019-09-28 (×4): 2000 [IU] via ORAL
  Filled 2019-09-24 (×5): qty 2

## 2019-09-24 MED ORDER — METHYLPREDNISOLONE SODIUM SUCC 125 MG IJ SOLR
125.0000 mg | Freq: Once | INTRAMUSCULAR | Status: AC
  Administered 2019-09-24: 125 mg via INTRAVENOUS
  Filled 2019-09-24: qty 2

## 2019-09-24 MED ORDER — METHYLPREDNISOLONE SODIUM SUCC 125 MG IJ SOLR
60.0000 mg | Freq: Four times a day (QID) | INTRAMUSCULAR | Status: DC
  Administered 2019-09-24 – 2019-09-26 (×6): 60 mg via INTRAVENOUS
  Filled 2019-09-24 (×6): qty 2

## 2019-09-24 MED ORDER — SODIUM CHLORIDE 0.9 % IV BOLUS
1000.0000 mL | Freq: Once | INTRAVENOUS | Status: AC
  Administered 2019-09-24: 1000 mL via INTRAVENOUS

## 2019-09-24 MED ORDER — SODIUM CHLORIDE 0.9% FLUSH: 3.0000 mL | INTRAVENOUS | Status: DC | PRN

## 2019-09-24 MED ORDER — INSULIN ASPART 100 UNIT/ML ~~LOC~~ SOLN
4.0000 [IU] | Freq: Three times a day (TID) | SUBCUTANEOUS | Status: DC
  Administered 2019-09-25 – 2019-09-28 (×10): 4 [IU] via SUBCUTANEOUS

## 2019-09-24 MED ORDER — IPRATROPIUM-ALBUTEROL 0.5-2.5 (3) MG/3ML IN SOLN
3.0000 mL | Freq: Four times a day (QID) | RESPIRATORY_TRACT | Status: DC
  Administered 2019-09-25 – 2019-09-27 (×9): 3 mL via RESPIRATORY_TRACT
  Filled 2019-09-24 (×9): qty 3

## 2019-09-24 MED ORDER — ONDANSETRON HCL 4 MG PO TABS: 4.0000 mg | ORAL_TABLET | Freq: Four times a day (QID) | ORAL | Status: DC | PRN

## 2019-09-24 MED ORDER — IPRATROPIUM-ALBUTEROL 0.5-2.5 (3) MG/3ML IN SOLN
3.0000 mL | RESPIRATORY_TRACT | Status: DC
  Administered 2019-09-24: 3 mL via RESPIRATORY_TRACT
  Filled 2019-09-24: qty 3

## 2019-09-24 MED ORDER — ACETAMINOPHEN 650 MG RE SUPP: 650.0000 mg | Freq: Four times a day (QID) | RECTAL | Status: DC

## 2019-09-24 MED ORDER — DOXYCYCLINE HYCLATE 100 MG PO TABS
100.0000 mg | ORAL_TABLET | Freq: Two times a day (BID) | ORAL | Status: AC
  Administered 2019-09-24 – 2019-09-27 (×7): 100 mg via ORAL
  Filled 2019-09-24 (×8): qty 1

## 2019-09-24 MED ORDER — INSULIN ASPART 100 UNIT/ML ~~LOC~~ SOLN
0.0000 [IU] | Freq: Three times a day (TID) | SUBCUTANEOUS | Status: DC
  Administered 2019-09-24: 4 [IU] via SUBCUTANEOUS
  Administered 2019-09-25: 15 [IU] via SUBCUTANEOUS
  Administered 2019-09-25: 11 [IU] via SUBCUTANEOUS
  Administered 2019-09-25: 4 [IU] via SUBCUTANEOUS
  Administered 2019-09-26 – 2019-09-27 (×4): 7 [IU] via SUBCUTANEOUS
  Administered 2019-09-27: 11 [IU] via SUBCUTANEOUS
  Administered 2019-09-27: 4 [IU] via SUBCUTANEOUS
  Administered 2019-09-28: 7 [IU] via SUBCUTANEOUS
  Administered 2019-09-28: 4 [IU] via SUBCUTANEOUS

## 2019-09-24 MED ORDER — ONDANSETRON HCL 4 MG/2ML IJ SOLN
4.0000 mg | Freq: Once | INTRAMUSCULAR | Status: AC
  Administered 2019-09-24: 4 mg via INTRAVENOUS
  Filled 2019-09-24: qty 2

## 2019-09-24 MED ORDER — BICTEGRAVIR-EMTRICITAB-TENOFOV 50-200-25 MG PO TABS
1.0000 | ORAL_TABLET | Freq: Every day | ORAL | Status: DC
  Administered 2019-09-24 – 2019-09-28 (×5): 1 via ORAL
  Filled 2019-09-24 (×6): qty 1

## 2019-09-24 MED ORDER — PRAVASTATIN SODIUM 40 MG PO TABS
40.0000 mg | ORAL_TABLET | Freq: Every day | ORAL | Status: DC
  Administered 2019-09-24 – 2019-09-27 (×4): 40 mg via ORAL
  Filled 2019-09-24 (×4): qty 1

## 2019-09-24 NOTE — ED Notes (Signed)
Attempted to ambulate patient. He was unable to and he became to short of breath and unable to sit on the edge of the bed

## 2019-09-24 NOTE — H&P (Addendum)
History and Physical  [facility]  Elizjah Siemen [id number] DOB: [birth year] DOA: 09/24/2019  PCP: Neva Seat, MD  Patient coming from: Home   I have personally briefly reviewed patient's old medical records in [place]  Chief Complaint: SOB  HPI: Jacob Joseph is a 64 y.o. male smoker and current crack cocaine user, HIV on HAART, BPH, metastatic prostate cancer, depression, steroid dependent COPD, HTN, history of PE on full anticoagulation, type 2 diabetes mellitus, stage 3a CKD, Hepatitis C presents to ED by EMS with complaints of increasing SOB.  He has had severe COPD exacerbations in the past requiring hospitalization and required positive pressure ventilation during last hospitalization.  He reports crack cocaine use yesterday.  He is on chantix and has restarted smoking 1 ppd cigarettes.  He reports that he is receiving palliative radiation treatments for his metastatic prostate cancer with extensive involvement of the thoracolumbar spine. He reports cough, chest congestion and low grade fever.  He reports thick white sputum.  His HIV has been well controlled and recently noted to have undetectable viral load and CD4 count around 300.   He reports left sided chest wall pain with taking a deep breath only.  This has persisted for the last week and nothing seems to help symptoms get better.    ED Course: He was given 325 mg aspirin by EMS en route.  He was given nebulizer treatments.  His D dimer was elevated and he was sent for CTA chest with findings of no PE but with progressive extensive metastatic disease from his prostate cancer.  He was hypoxic on arrival responding well to supplemental oxygen.  He was tachycardic and tachypneic.  He was hypertensive on arrival but BP settled down with respiratory treatments.  They tried to ambulate him in the ED but he was too weak to stand and became hypoxic with every attempt to ambulate.  He was given IV steroids and admission  was requested.      Review of Systems: As per HPI otherwise 10 point review of systems negative.   Past Medical History:  Diagnosis Date  . Abnormal computed tomography of thoracic spine 02/26/2018  . AKI (acute kidney injury) ([place]) 09/24/2016  . BPH associated with nocturia 06/19/2016  . Calcium oxalate crystals in urine 12/23/2011   Asymptomatic, no hematuria. Advised to take plenty of water.  . Calculus of gallbladder without cholecystitis without obstruction   . Closed fracture of proximal end of left humerus with delayed healing 05/26/2018  . COPD (chronic obstructive pulmonary disease) ([place])   . Depression   . Diabetes mellitus without complication ([place]) [id number]   pre diabetic  . Hepatitis C   . HIV (human immunodeficiency virus infection) ([place])   . Hypertension   . Leukocytosis 09/01/2018  . PE (pulmonary thromboembolism) ([place]) 05/12/2016   PE in 2017, on chronic anticoagulation for this and recurrent DVT.  [name] Kitchen Peripheral arterial disease ([place])    ooccluded left SFA by duplex ultrasound, tibial disease left greater than right  . Pressure injury of skin 01/13/2019  . Prostate cancer metastatic to multiple sites [facility])   . Right upper quadrant pain 02/26/2018  . Severe sepsis ([place]) 08/31/2018  . Stroke ([place]) 11/2011   Carotids Doppler negative. Right sided weakness resolved, initially on Plavix for 3 months and then continued with ASA.    . TB lung, latent 1988   Treated    Past Surgical History:  Procedure Laterality Date  . HERNIA REPAIR    .  INGUINAL HERNIA REPAIR  01/16/2012   Procedure: HERNIA REPAIR INGUINAL INCARCERATED;  Surgeon: Zenovia Jarred, MD;  Location: [place];  Service: General;  Laterality: Right;  . TEE WITHOUT CARDIOVERSION  12/07/2011   Procedure: TRANSESOPHAGEAL ECHOCARDIOGRAM (TEE);  Surgeon: Birdie Riddle, MD;  Location: [facility] ENDOSCOPY;  Service: Cardiovascular;  Laterality: N/A;     reports that he has been smoking cigarettes. He has been smoking about 0.45 packs  per day. He has never used smokeless tobacco. He reports current drug use. Frequency: 3.00 times per week. Drugs: "Crack" cocaine and Cocaine. He reports that he does not drink alcohol.  Allergies  Allergen Reactions  . Lisinopril Swelling  . Statins Other (See Comments)    Elevated liver enzymes.    Family History  Problem Relation Age of Onset  . Hypertension Mother   . Heart attack Mother   . Stroke Mother   . Hypertension Father   . Cancer Father      Prior to Admission medications   Medication Sig Start Date End Date Taking? Authorizing Provider  aspirin EC 81 MG tablet Take 81 mg by mouth daily.   Yes [provider]  gabapentin (NEURONTIN) 300 MG capsule Take 2 capsules (600 mg total) by mouth 2 (two) times daily. 08/03/19 09/24/19 Yes Neva Seat, MD  HYDROcodone-acetaminophen (NORCO/VICODIN) 5-325 MG tablet Take 1 tablet by mouth every 12 (twelve) hours as needed for up to 60 doses for moderate pain or severe pain. 08/17/19  Yes Neva Seat, MD  hydrOXYzine (VISTARIL) 50 MG capsule Take 50 mg by mouth at bedtime. 08/28/19  Yes [provider]  apixaban (ELIQUIS) 2.5 MG TABS tablet Take 1 tablet (2.5 mg total) by mouth 2 (two) times daily. Patient not taking: Reported on 09/24/2019 05/15/19 08/13/19  Neva Seat, MD  azelastine (ASTELIN) 0.1 % nasal spray USE 1 SPRAY IN EACH NOSTRIL TWICE DAILY AS NEEDED FOR RHINITIS 02/13/19   Neva Seat, MD  bicalutamide (CASODEX) 50 MG tablet Take 50 mg by mouth daily.    [provider]  bictegravir-emtricitabine-tenofovir AF (BIKTARVY) 50-200-25 MG TABS tablet Take 1 tablet by mouth daily. 12/21/18   Michel Bickers, MD  CHANTIX 0.5 MG tablet Take 0.5 mg by mouth daily. Then take 1 tablet by mouth twice daily. 03/07/19   [provider]  cholecalciferol (VITAMIN D) 1000 units tablet Take 1 tablet (1,000 Units total) by mouth daily. Patient taking differently: Take 2,000 Units by mouth daily.   01/27/16   Burgess Estelle, MD  ciclopirox (LOPROX) 0.77 % cream APPLY TOPICALLY TO THE AFFECTED AREA TWICE DAILY 09/11/19   Marzetta Board, DPM  citalopram (CELEXA) 20 MG tablet Take 20 mg by mouth daily. 10/25/14   [provider]  diltiazem (DILT-XR) 120 MG 24 hr capsule TAKE 1 CAPSULE(120 MG) BY MOUTH DAILY 08/29/19   Velna Ochs, MD  FLOVENT HFA 220 MCG/ACT inhaler USE 1 PUFF BY MOUTH TWICE DAILY Patient not taking: Reported on 09/24/2019 05/26/19   Neva Seat, MD  HYDROcodone-acetaminophen (NORCO/VICODIN) 5-325 MG tablet Take 1 tablet by mouth every 12 (twelve) hours as needed for up to 60 doses for moderate pain. 08/17/19   Neva Seat, MD  HYDROcodone-acetaminophen (NORCO/VICODIN) 5-325 MG tablet Take 1 tablet by mouth every 12 (twelve) hours as needed for up to 60 doses for moderate pain or severe pain. 08/17/19   Neva Seat, MD  Lancets [facility] DELICA PLUS [id number]) MISC 2 (two) times daily. 07/17/19   [provider]  metFORMIN (  GLUCOPHAGE) 1000 MG tablet Take 1 tablet (1,000 mg total) by mouth 2 (two) times daily with a meal. Patient taking differently: Take 500 mg by mouth 2 (two) times daily with a meal.  05/15/19   Neva Seat, MD  [facility] VERIO test strip TEST UP TO TWICE DAILY 07/17/19   [provider]  pravastatin (PRAVACHOL) 40 MG tablet TAKE 1 TABLET(40 MG) BY MOUTH DAILY 06/07/19   Neva Seat, MD  prochlorperazine (COMPAZINE) 10 MG tablet Take 1 tablet (10 mg total) by mouth every 6 (six) hours as needed for nausea or vomiting. 08/15/19   Tyler Pita, MD  QUEtiapine (SEROQUEL) 400 MG tablet Take 1 tablet (400 mg total) by mouth at bedtime. 08/17/19 09/16/19  Neva Seat, MD  STIOLTO RESPIMAT 2.5-2.5 MCG/ACT AERS INL 2 PUFFS PO ITL D 03/14/19   [provider]  tamsulosin (FLOMAX) 0.4 MG CAPS capsule TAKE 1 CAPSULE(0.4 MG) BY MOUTH DAILY AFTER BREAKFAST Patient taking differently: Take 0.4 mg by mouth  daily after breakfast.  10/21/18   Neva Seat, MD  valACYclovir (VALTREX) 500 MG tablet TAKE 1 TABLET BY MOUTH DAILY 05/10/19   Michel Bickers, MD  vitamin E 200 UNIT capsule Take 200 Units by mouth daily.    [provider]    Physical Exam: Vitals:   09/24/19 1300 09/24/19 1330 09/24/19 1400 09/24/19 1430  BP: 122/89 (!) 140/101 140/90 (!) 139/92  Pulse:  (!) 109 (!) 109 (!) 107  Resp: (!) 21 (!) 29 (!) 25 20  Temp:      TempSrc:      SpO2:  90% 93% 97%  Weight:      Height:       Constitutional: NAD, calm, comfortable, he appears frail and chronically ill and poorly kept.  Eyes: PERRL, lids and conjunctivae normal ENMT: Mucous membranes are moist. Posterior pharynx clear of any exudate or lesions.Normal dentition.  Neck: normal, supple, no masses, no thyromegaly Respiratory: diffuse wheezes and rales bilateral with crackles.tachypneic. No accessory muscle use.  Cardiovascular: tachycardic, no murmurs / rubs / gallops. No extremity edema. 2+ pedal pulses. No carotid bruits.  Abdomen: no tenderness, no masses palpated. No hepatosplenomegaly. Bowel sounds positive.  Musculoskeletal: no clubbing / cyanosis. No joint deformity upper and lower extremities. Good ROM, no contractures. Normal muscle tone.  Skin: no rashes, lesions, ulcers. No induration Neurologic: CN 2-12 grossly intact. Sensation intact, DTR normal. Strength 5/5 in all 4.  Psychiatric: Normal judgment and insight. Alert and oriented x 3. Normal mood.   Labs on Admission: I have personally reviewed following labs and imaging studies  CBC: Recent Labs  Lab 09/24/19 0951  WBC 9.4  HGB 8.6*  HCT 27.0*  MCV 78.3*  PLT [id number]   Basic Metabolic Panel: Recent Labs  Lab 09/24/19 0951  NA 134*  K 4.7  CL 102  CO2 23  GLUCOSE 165*  BUN 17  CREATININE 1.09  CALCIUM 9.2   GFR: Estimated Creatinine Clearance: 77.1 mL/min (by C-G formula based on SCr of 1.09 mg/dL). Liver Function Tests: No results  for input(s): AST, ALT, ALKPHOS, BILITOT, PROT, ALBUMIN in the last 168 hours. No results for input(s): LIPASE, AMYLASE in the last 168 hours. No results for input(s): AMMONIA in the last 168 hours. Coagulation Profile: No results for input(s): INR, PROTIME in the last 168 hours. Cardiac Enzymes: No results for input(s): CKTOTAL, CKMB, CKMBINDEX, TROPONINI in the last 168 hours. BNP (last 3 results) No results for input(s): PROBNP in the last 8760  hours. HbA1C: No results for input(s): HGBA1C in the last 72 hours. CBG: No results for input(s): GLUCAP in the last 168 hours. Lipid Profile: No results for input(s): CHOL, HDL, LDLCALC, TRIG, CHOLHDL, LDLDIRECT in the last 72 hours. Thyroid Function Tests: No results for input(s): TSH, T4TOTAL, FREET4, T3FREE, THYROIDAB in the last 72 hours. Anemia Panel: No results for input(s): VITAMINB12, FOLATE, FERRITIN, TIBC, IRON, RETICCTPCT in the last 72 hours. Urine analysis:    Component Value Date/Time   COLORURINE YELLOW 05/18/2019 0818   APPEARANCEUR HAZY (A) 05/18/2019 0818   LABSPEC 1.016 05/18/2019 0818   PHURINE 5.0 05/18/2019 0818   GLUCOSEU NEGATIVE 05/18/2019 0818   HGBUR MODERATE (A) 05/18/2019 0818   HGBUR negative 12/25/2008 1206   BILIRUBINUR NEGATIVE 05/18/2019 0818   BILIRUBINUR negative 06/19/2016 1000   KETONESUR NEGATIVE 05/18/2019 0818   PROTEINUR 30 (A) 05/18/2019 0818   UROBILINOGEN 0.2 06/19/2016 1000   UROBILINOGEN 1.0 11/19/2014 1650   NITRITE POSITIVE (A) 05/18/2019 0818   LEUKOCYTESUR SMALL (A) 05/18/2019 0818    Radiological Exams on Admission: CT Angio Chest PE W and/or Wo Contrast  Result Date: 09/24/2019 CLINICAL DATA:  Centralized chest pain beginning earlier today. Evaluate for pulmonary embolism. History of COPD and metastatic prostate cancer. EXAM: CT ANGIOGRAPHY CHEST WITH CONTRAST TECHNIQUE: Multidetector CT imaging of the chest was performed using the standard protocol during bolus administration of  intravenous contrast. Multiplanar CT image reconstructions and MIPs were obtained to evaluate the vascular anatomy. CONTRAST:  11mL OMNIPAQUE IOHEXOL 350 MG/ML SOLN COMPARISON:  02/27/2018; bone scan-04/11/2019 FINDINGS: Vascular Findings: There is suboptimal opacification of the pulmonary arterial system with the main pulmonary artery measuring 279 Hounsfield units. Given this limitation, there no discrete filling defects within the central pulmonary arterial tree to suggest pulmonary embolism. Evaluation of the distal subsegmental pulmonary arteries is degraded secondary to a combination suboptimal vessel opacification as well as patient respiratory artifact. Normal caliber the main pulmonary artery. Cardiomegaly. Coronary artery calcifications. No pericardial effusion. Normal caliber of the thoracic aorta. No evidence of thoracic aortic dissection or periaortic stranding on this nongated examination. Incidental note made of an aortic nipple. There is a minimal amount of atherosclerotic plaque involving the undersurface of the aortic arch. Conventional configuration of the aortic arch. The branch vessels of the aortic arch appear patent throughout their imaged courses. Review of the MIP images confirms the above findings. ---------------------------------------------------------------------------------- Nonvascular Findings: Mediastinum/Lymph Nodes: Interval increase in size of mediastinal lymph nodes which are numerous though individually not enlarged by size criteria with index left-sided paratracheal lymph node measuring 0.9 cm in greatest short axis diameter (image 34, series 4). No bulky hilar lymphadenopathy. No axillary lymphadenopathy Lungs/Pleura: Bibasilar dependent subpleural ground-glass atelectasis. Subsegmental atelectasis is noted about the bilateral fissures. Geographic pleuroparenchymal thickening about the anterior aspect of the left upper lobe (image 49, series 6), is unchanged compared to the  02/2018 examination and favored to represent scarring. No discrete focal airspace opacities. No pleural effusion or pneumothorax. The central pulmonary airways appear patent. No discrete pulmonary nodules given parenchymal abnormalities and patient respiration artifact. Upper abdomen: Limited early arterial phase evaluation of the upper abdomen demonstrates diffuse decreased attenuation of the hepatic parenchyma suggestive of hepatic steatosis. Note is made of a punctate (3 mm) gallstone within otherwise normal-appearing gallbladder (image 102, series 4). Hypoattenuating renal cysts are seen bilaterally. Musculoskeletal: Progressive osseous metastatic disease including development of mild (approximately 25%) malignant compression deformities involving the T1, T2, T3, T4 and T11 vertebral bodies without  associated retropulsion (representative sagittal image 118, series 8). Note is made of a nondisplaced fracture involving T1 spinous process (sagittal image 116, series 8). Progression of extensive bilateral osseous metastatic disease involving multiple ribs bilaterally including development of a nondisplaced pathologic fracture involving the anterior aspect of the right fifth rib. (Image 49, series 4) IMPRESSION: 1. No acute cardiopulmonary disease. Specifically, no evidence of pulmonary embolism to the level the bilateral subsegmental pulmonary arteries. 2. Findings compatible with progression prostate cancer including marked progression of osseous metastatic disease with development of mild (approximately 25%) malignant compression deformities involving the T1, T2, T3, T4 and T11 vertebral bodies (without definitive associated retropulsion) and extensive bilateral rib metastasis including a nondisplaced pathologic fracture involving the anterior aspect of the right 5th rib. 3. Prominent though non pathologically enlarged mediastinal lymph nodes, nonspecific though metastatic disease not excluded. 4. Cardiomegaly. 5.  Coronary artery calcifications. Aortic Atherosclerosis (ICD10-I70.0). Electronically Signed   By: Sandi Mariscal M.D.   On: 09/24/2019 12:47   DG Chest Port 1 View  Result Date: 09/24/2019 CLINICAL DATA:  Patient c/o left side chest pain with shortness of breath x1 week. Patient does have hx of COPD EXAM: PORTABLE CHEST 1 VIEW COMPARISON:  Chest radiograph 05/18/2019 FINDINGS: Stable cardiomediastinal contours with enlarged heart size. There are scattered bilateral bandlike opacities consistent with scarring or atelectasis. No new consolidation. No pneumothorax or significant pleural effusion. No acute finding in the visualized skeleton. IMPRESSION: Scattered bandlike opacities likely reflecting scarring or atelectasis. Electronically Signed   By: Audie Pinto M.D.   On: 09/24/2019 10:30   EKG: Independently reviewed. Sinus tachycardia   Assessment/Plan Principal Problem:   COPD with acute exacerbation (HCC) Active Problems:   Acute and chronic respiratory failure with hypoxia (HCC)   HIV disease (HCC)   Smoking greater than 30 pack years   Hypertension   History of CVA   Normocytic anemia   CKD (chronic kidney disease) stage 3, GFR 30-59 ml/min (HCC)   Diabetes mellitus treated with oral medication ([place])   Cataracts, bilateral   Hyperlipidemia   Vitamin D deficiency   Substance abuse ([place])   Prostate cancer metastatic to multiple sites [facility])   History of DVT (deep vein thrombosis)   Spine metastasis ([place])   Cocaine abuse ([place])   1. Acute on chronic respiratory failure with hypoxia  - secondary to COPD exacerbation. He continues to smoke and abuse crack cocaine.  He is high risk for intubation.  He has required bipap therapy in the past.  Will admit for IV steroids, bronchodilators, antibiotics and supportive therapies.  2. Metastatic prostate cancer - he has progressive metastatic disease and treatments are only palliative at this point.  Given the extensive progression of metastases  seen on CT imaging I believe that a palliative medicine consult would be beneficial.   3. HIV disease - he is followed by ID clinic and we are resuming his home ARV therapy. His viral load has been undetectable and CD4 count of 300.  4. Essential hypertension - resume home medication and follow. 5. Cocaine abuse - Pt admits to ongoing and recent crack cocaine use.  Will ask TOC to counsel on substance abuse and offer treatment.  6. Type 2 diabetes mellitus with renal complications - SSI coverage and CBG testing, prandial coverage ordered, suspect hyperglycemia secondary to IV steroid usage.  7. Hyperlipidemia - resume home pravastatin.  8. Vitamin D deficiency - continue supplemental vitamin D 2000 IU daily.  9. Tobacco - Pt has  restarted smoking cigarettes.  I have offered him a nicotine patch and counseled him on cessation.  However at this time patient does not seem amenable to stopping and seems to accept the consequences.  10. Behavioral health - reported history of bipolar depression on seroquel 50 TID per wife and 400 mg QHS, reordered the night dose and ordered day dose on as needed basis to avoid excess sedation.    DVT prophylaxis: apixaban   Code Status: full   Family Communication: spoke to wife by telephone and reviewed home meds, reached wife at [phone]   Disposition Plan: INP for IV steroids, antibiotics and supportive care, high risk for decompensation requiring escalation of care.   Consults called:   Admission status: INP    Lyndal Alamillo MD [facility] How to contact the [facility] Attending or Consulting provider [place] or covering provider during after hours [place], for this patient?  1. Check the care team in [facility] and look for a) attending/consulting [facility] provider listed and b) the [facility] team listed 2. Log into [email or web address] and use [place]'s universal password to access. If you do not have the password, please contact the hospital operator. 3. Locate the [facility]  provider you are looking for under [facility] and page to a number that you can be directly reached. 4. If you still have difficulty reaching the provider, please page the [facility] (Director on Call) for the Hospitalists listed on amion for assistance.   If 7PM-7AM, please contact night-coverage [email or web address] Password [facility]  09/24/2019, 3:04 PM

## 2019-09-24 NOTE — ED Triage Notes (Signed)
Patient brought in via EMS from home. Alert and oriented. Airway patent. Patient c/o left side chest pain with shortness of breath x1 week. Patient does have hx of COPD in which he uses nebulizer, lasted used 2 days ago. Patient reports productive cough with thick white sputum. Denies any fevers. Denies any cardiac hx. Patient given x4 baby aspirin in route by paramedic.

## 2019-09-24 NOTE — ED Notes (Signed)
Resp paged for breathing treatment.  

## 2019-09-24 NOTE — ED Provider Notes (Signed)
[facility] Provider Note   CSN: [id number] Arrival date & time: 09/24/19  [id number]     History Chief Complaint  Patient presents with  . Chest Pain    Jacob Joseph is a 64 y.o. male.  Pt presents to the ED today with cp and cough.  Sx have been going on for a week.  He said the pain is on the left side.  He does have COPD, but last used his inhaler 2 days ago.  Pt denies any known covid exposures.  Pt also has a hx of HIV and last HIV viral load in August was undetectable.  CD4 cells over 300.  Pt also has a hx of PE and DVT and is on Elqiuis.  He reports compliance.  He does admit to using cocaine yesterday.  He denies f/c.  He was given 324 mg asa by EMS en route.         Past Medical History:  Diagnosis Date  . Abnormal computed tomography of thoracic spine 02/26/2018  . AKI (acute kidney injury) ([place]) 09/24/2016  . BPH associated with nocturia 06/19/2016  . Calcium oxalate crystals in urine 12/23/2011   Asymptomatic, no hematuria. Advised to take plenty of water.  . Calculus of gallbladder without cholecystitis without obstruction   . Closed fracture of proximal end of left humerus with delayed healing 05/26/2018  . COPD (chronic obstructive pulmonary disease) ([place])   . Depression   . Diabetes mellitus without complication ([place]) [id number]   pre diabetic  . Hepatitis C   . HIV (human immunodeficiency virus infection) ([place])   . Hypertension   . Leukocytosis 09/01/2018  . PE (pulmonary thromboembolism) ([place]) 05/12/2016   PE in 2017, on chronic anticoagulation for this and recurrent DVT.  [name] Kitchen Peripheral arterial disease ([place])    ooccluded left SFA by duplex ultrasound, tibial disease left greater than right  . Pressure injury of skin 01/13/2019  . Prostate cancer metastatic to multiple sites [facility])   . Right upper quadrant pain 02/26/2018  . Severe sepsis ([place]) 08/31/2018  . Stroke ([place]) 11/2011   Carotids Doppler negative. Right sided weakness resolved, initially on  Plavix for 3 months and then continued with ASA.    . TB lung, latent 1988   Treated    Patient Active Problem List   Diagnosis Date Noted  . COPD with acute exacerbation ([place]) 09/24/2019  . Xerosis of skin 02/26/2019  . History of angioedema 01/10/2019  . Acute and chronic respiratory failure with hypoxia ([place]) 08/31/2018  . 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 History of DVT (deep vein thrombosis)   . Prostate cancer metastatic to multiple sites [facility])   . Radicular pain of thoracic region   . Chronic anticoagulation   . Recurrent falls 11/02/2016  . Substance abuse ([place]) 01/27/2016  . Vitamin D deficiency 12/05/2015  . Hyperlipidemia 01/28/2015  . Cataracts, bilateral 08/29/2014  . Diabetes mellitus treated with oral medication ([place]) 10/19/2013  . COPD, moderate ([place]) 05/31/2013  . Complex Lt Renal Cyst 03/22/2013  . Preventive measure 05/30/2012  . History of CVA 03/08/2012  . Normocytic anemia 03/08/2012  . CKD (chronic kidney disease) stage 3, GFR 30-59 ml/min (HCC) 03/08/2012  . ERECTILE DYSFUNCTION 04/25/2008  . HIV disease ([place]) 04/27/2006  . Smoking greater than 30 pack years 04/27/2006  . Major depressive disorder, recurrent episode, in full remission ([place]) 04/27/2006  . Hypertension 04/27/2006  . DEGENERATIVE JOINT DISEASE 04/27/2006  . Chronic Low Back Pain 04/27/2006  Past Surgical History:  Procedure Laterality Date  . HERNIA REPAIR    . INGUINAL HERNIA REPAIR  01/16/2012   Procedure: HERNIA REPAIR INGUINAL INCARCERATED;  Surgeon: Zenovia Jarred, MD;  Location: [facility];  Service: General;  Laterality: Right;  . TEE WITHOUT CARDIOVERSION  12/07/2011   Procedure: TRANSESOPHAGEAL ECHOCARDIOGRAM (TEE);  Surgeon: Birdie Riddle, MD;  Location: [facility] ENDOSCOPY;  Service: Cardiovascular;  Laterality: N/A;       Family History  Problem Relation Age of Onset  . Hypertension Mother   . Heart attack Mother   . Stroke Mother   . Hypertension Father   . Cancer  Father     Social History   Tobacco Use  . Smoking status: Current Every Day Smoker    Packs/day: 0.45    Types: Cigarettes    Last attempt to quit: 11/18/2014    Years since quitting: 4.8  . Smokeless tobacco: Never Used  . Tobacco comment: 5-6 cigs per day; ready to quit  Substance Use Topics  . Alcohol use: No    Alcohol/week: 0.0 standard drinks  . Drug use: Yes    Frequency: 3.0 times per week    Types: "Crack" cocaine, Cocaine    Comment: used 09/23/2019    Home Medications Prior to Admission medications   Medication Sig Start Date End Date Taking? Authorizing Provider  aspirin EC 81 MG tablet Take 81 mg by mouth daily.   Yes [provider]  gabapentin (NEURONTIN) 300 MG capsule Take 2 capsules (600 mg total) by mouth 2 (two) times daily. 08/03/19 09/24/19 Yes Neva Seat, MD  HYDROcodone-acetaminophen (NORCO/VICODIN) 5-325 MG tablet Take 1 tablet by mouth every 12 (twelve) hours as needed for up to 60 doses for moderate pain or severe pain. 08/17/19  Yes Neva Seat, MD  hydrOXYzine (VISTARIL) 50 MG capsule Take 50 mg by mouth at bedtime. 08/28/19  Yes [provider]  apixaban (ELIQUIS) 2.5 MG TABS tablet Take 1 tablet (2.5 mg total) by mouth 2 (two) times daily. Patient not taking: Reported on 09/24/2019 05/15/19 08/13/19  Neva Seat, MD  azelastine (ASTELIN) 0.1 % nasal spray USE 1 SPRAY IN EACH NOSTRIL TWICE DAILY AS NEEDED FOR RHINITIS 02/13/19   Neva Seat, MD  bicalutamide (CASODEX) 50 MG tablet Take 50 mg by mouth daily.    [provider]  bictegravir-emtricitabine-tenofovir AF (BIKTARVY) 50-200-25 MG TABS tablet Take 1 tablet by mouth daily. 12/21/18   Michel Bickers, MD  CHANTIX 0.5 MG tablet Take 0.5 mg by mouth daily. Then take 1 tablet by mouth twice daily. 03/07/19   [provider]  cholecalciferol (VITAMIN D) 1000 units tablet Take 1 tablet (1,000 Units total) by mouth daily. Patient taking differently: Take  2,000 Units by mouth daily.  01/27/16   Burgess Estelle, MD  ciclopirox (LOPROX) 0.77 % cream APPLY TOPICALLY TO THE AFFECTED AREA TWICE DAILY 09/11/19   Marzetta Board, DPM  citalopram (CELEXA) 20 MG tablet Take 20 mg by mouth daily. 10/25/14   [provider]  diltiazem (DILT-XR) 120 MG 24 hr capsule TAKE 1 CAPSULE(120 MG) BY MOUTH DAILY 08/29/19   Velna Ochs, MD  FLOVENT HFA 220 MCG/ACT inhaler USE 1 PUFF BY MOUTH TWICE DAILY Patient not taking: Reported on 09/24/2019 05/26/19   Neva Seat, MD  HYDROcodone-acetaminophen (NORCO/VICODIN) 5-325 MG tablet Take 1 tablet by mouth every 12 (twelve) hours as needed for up to 60 doses for moderate pain. 08/17/19   Neva Seat, MD  HYDROcodone-acetaminophen (NORCO/VICODIN)  5-325 MG tablet Take 1 tablet by mouth every 12 (twelve) hours as needed for up to 60 doses for moderate pain or severe pain. 08/17/19   Neva Seat, MD  Lancets [facility] DELICA PLUS [id number]) MISC 2 (two) times daily. 07/17/19   [provider]  metFORMIN (GLUCOPHAGE) 1000 MG tablet Take 1 tablet (1,000 mg total) by mouth 2 (two) times daily with a meal. Patient taking differently: Take 500 mg by mouth 2 (two) times daily with a meal.  05/15/19   Neva Seat, MD  [facility] VERIO test strip TEST UP TO TWICE DAILY 07/17/19   [provider]  pravastatin (PRAVACHOL) 40 MG tablet TAKE 1 TABLET(40 MG) BY MOUTH DAILY 06/07/19   Neva Seat, MD  prochlorperazine (COMPAZINE) 10 MG tablet Take 1 tablet (10 mg total) by mouth every 6 (six) hours as needed for nausea or vomiting. 08/15/19   Tyler Pita, MD  QUEtiapine (SEROQUEL) 400 MG tablet Take 1 tablet (400 mg total) by mouth at bedtime. 08/17/19 09/16/19  Neva Seat, MD  STIOLTO RESPIMAT 2.5-2.5 MCG/ACT AERS INL 2 PUFFS PO ITL D 03/14/19   [provider]  tamsulosin (FLOMAX) 0.4 MG CAPS capsule TAKE 1 CAPSULE(0.4 MG) BY MOUTH DAILY AFTER BREAKFAST Patient taking  differently: Take 0.4 mg by mouth daily after breakfast.  10/21/18   Neva Seat, MD  valACYclovir (VALTREX) 500 MG tablet TAKE 1 TABLET BY MOUTH DAILY 05/10/19   Michel Bickers, MD  vitamin E 200 UNIT capsule Take 200 Units by mouth daily.    [provider]    Allergies    Lisinopril and Statins  Review of Systems   Review of Systems  Respiratory: Positive for cough and shortness of breath.   Cardiovascular: Positive for chest pain.  All other systems reviewed and are negative.   Physical Exam Updated Vital Signs BP 140/90   Pulse (!) 109   Temp 98.5 F (36.9 C) (Oral)   Resp (!) 25   Ht 6' (1.829 m)   Wt 90.7 kg   SpO2 93%   BMI 27.12 kg/m   Physical Exam Vitals and nursing note reviewed. Exam conducted with a chaperone present.  Constitutional:      Appearance: He is well-developed.  HENT:     Head: Normocephalic and atraumatic.  Eyes:     Extraocular Movements: Extraocular movements intact.     Pupils: Pupils are equal, round, and reactive to light.  Cardiovascular:     Rate and Rhythm: Regular rhythm. Tachycardia present.     Heart sounds: Normal heart sounds.  Pulmonary:     Breath sounds: Wheezing and rhonchi present.  Abdominal:     General: Bowel sounds are normal.     Palpations: Abdomen is soft.  Genitourinary:    Rectum: Guaiac result negative.     Comments: Stool is brownish-yellow.  Not black. Musculoskeletal:        General: Normal range of motion.     Cervical back: Normal range of motion and neck supple.  Skin:    General: Skin is warm.     Capillary Refill: Capillary refill takes less than 2 seconds.  Neurological:     Mental Status: He is alert and oriented to person, place, and time.     Comments: Weakness to left arm and leg and facial droop c/w old CVA  Psychiatric:        Mood and Affect: Mood normal.        Behavior: Behavior normal.  ED Results / Procedures / Treatments   Labs (all labs ordered are listed, but  only abnormal results are displayed) Labs Reviewed  BASIC METABOLIC PANEL - Abnormal; Notable for the following components:      Result Value   Sodium 134 (*)    Glucose, Bld 165 (*)    All other components within normal limits  CBC - Abnormal; Notable for the following components:   RBC 3.45 (*)    Hemoglobin 8.6 (*)    HCT 27.0 (*)    MCV 78.3 (*)    MCH 24.9 (*)    RDW 17.4 (*)    All other components within normal limits  D-DIMER, QUANTITATIVE (NOT AT [facility]) - Abnormal; Notable for the following components:   D-Dimer, Quant 4.36 (*)    All other components within normal limits  RAPID URINE DRUG SCREEN, HOSP PERFORMED - Abnormal; Notable for the following components:   Opiates POSITIVE (*)    Cocaine POSITIVE (*)    All other components within normal limits  RESPIRATORY PANEL BY RT PCR (FLU A&B, COVID)  CULTURE, BLOOD (ROUTINE X 2)  CULTURE, BLOOD (ROUTINE X 2)  LACTIC ACID, PLASMA  LACTIC ACID, PLASMA  POC OCCULT BLOOD, ED  TROPONIN I (HIGH SENSITIVITY)  TROPONIN I (HIGH SENSITIVITY)    EKG EKG Interpretation  Date/Time:  Sunday September 24 2019 09:15:44 EST Ventricular Rate:  116 PR Interval:    QRS Duration: 131 QT Interval:  370 QTC Calculation: 514 R Axis:   -80 Text Interpretation: Sinus tachycardia RBBB and LAFB No significant change since last tracing Confirmed by Isla Pence [phone]) on 09/24/2019 9:30:06 AM   Radiology CT Angio Chest PE W and/or Wo Contrast  Result Date: 09/24/2019 CLINICAL DATA:  Centralized chest pain beginning earlier today. Evaluate for pulmonary embolism. History of COPD and metastatic prostate cancer. EXAM: CT ANGIOGRAPHY CHEST WITH CONTRAST TECHNIQUE: Multidetector CT imaging of the chest was performed using the standard protocol during bolus administration of intravenous contrast. Multiplanar CT image reconstructions and MIPs were obtained to evaluate the vascular anatomy. CONTRAST:  13mL OMNIPAQUE IOHEXOL 350 MG/ML SOLN COMPARISON:   02/27/2018; bone scan-04/11/2019 FINDINGS: Vascular Findings: There is suboptimal opacification of the pulmonary arterial system with the main pulmonary artery measuring 279 Hounsfield units. Given this limitation, there no discrete filling defects within the central pulmonary arterial tree to suggest pulmonary embolism. Evaluation of the distal subsegmental pulmonary arteries is degraded secondary to a combination suboptimal vessel opacification as well as patient respiratory artifact. Normal caliber the main pulmonary artery. Cardiomegaly. Coronary artery calcifications. No pericardial effusion. Normal caliber of the thoracic aorta. No evidence of thoracic aortic dissection or periaortic stranding on this nongated examination. Incidental note made of an aortic nipple. There is a minimal amount of atherosclerotic plaque involving the undersurface of the aortic arch. Conventional configuration of the aortic arch. The branch vessels of the aortic arch appear patent throughout their imaged courses. Review of the MIP images confirms the above findings. ---------------------------------------------------------------------------------- Nonvascular Findings: Mediastinum/Lymph Nodes: Interval increase in size of mediastinal lymph nodes which are numerous though individually not enlarged by size criteria with index left-sided paratracheal lymph node measuring 0.9 cm in greatest short axis diameter (image 34, series 4). No bulky hilar lymphadenopathy. No axillary lymphadenopathy Lungs/Pleura: Bibasilar dependent subpleural ground-glass atelectasis. Subsegmental atelectasis is noted about the bilateral fissures. Geographic pleuroparenchymal thickening about the anterior aspect of the left upper lobe (image 49, series 6), is unchanged compared to the 02/2018 examination and favored to  represent scarring. No discrete focal airspace opacities. No pleural effusion or pneumothorax. The central pulmonary airways appear patent. No  discrete pulmonary nodules given parenchymal abnormalities and patient respiration artifact. Upper abdomen: Limited early arterial phase evaluation of the upper abdomen demonstrates diffuse decreased attenuation of the hepatic parenchyma suggestive of hepatic steatosis. Note is made of a punctate (3 mm) gallstone within otherwise normal-appearing gallbladder (image 102, series 4). Hypoattenuating renal cysts are seen bilaterally. Musculoskeletal: Progressive osseous metastatic disease including development of mild (approximately 25%) malignant compression deformities involving the T1, T2, T3, T4 and T11 vertebral bodies without associated retropulsion (representative sagittal image 118, series 8). Note is made of a nondisplaced fracture involving T1 spinous process (sagittal image 116, series 8). Progression of extensive bilateral osseous metastatic disease involving multiple ribs bilaterally including development of a nondisplaced pathologic fracture involving the anterior aspect of the right fifth rib. (Image 49, series 4) IMPRESSION: 1. No acute cardiopulmonary disease. Specifically, no evidence of pulmonary embolism to the level the bilateral subsegmental pulmonary arteries. 2. Findings compatible with progression prostate cancer including marked progression of osseous metastatic disease with development of mild (approximately 25%) malignant compression deformities involving the T1, T2, T3, T4 and T11 vertebral bodies (without definitive associated retropulsion) and extensive bilateral rib metastasis including a nondisplaced pathologic fracture involving the anterior aspect of the right 5th rib. 3. Prominent though non pathologically enlarged mediastinal lymph nodes, nonspecific though metastatic disease not excluded. 4. Cardiomegaly. 5. Coronary artery calcifications. Aortic Atherosclerosis (ICD10-I70.0). Electronically Signed   By: Sandi Mariscal M.D.   On: 09/24/2019 12:47   DG Chest Port 1 View  Result  Date: 09/24/2019 CLINICAL DATA:  Patient c/o left side chest pain with shortness of breath x1 week. Patient does have hx of COPD EXAM: PORTABLE CHEST 1 VIEW COMPARISON:  Chest radiograph 05/18/2019 FINDINGS: Stable cardiomediastinal contours with enlarged heart size. There are scattered bilateral bandlike opacities consistent with scarring or atelectasis. No new consolidation. No pneumothorax or significant pleural effusion. No acute finding in the visualized skeleton. IMPRESSION: Scattered bandlike opacities likely reflecting scarring or atelectasis. Electronically Signed   By: Audie Pinto M.D.   On: 09/24/2019 10:30    Procedures Procedures (including critical care time)  Medications Ordered in ED Medications  albuterol (VENTOLIN HFA) 108 (90 Base) MCG/ACT inhaler 4 puff (4 puffs Inhalation Given 09/24/19 1005)  AeroChamber Plus Flo-Vu Medium MISC 1 each (1 each Other Given 09/24/19 1005)  sodium chloride 0.9 % bolus 1,000 mL (0 mLs Intravenous Stopped 09/24/19 1100)  morphine 4 MG/ML injection 4 mg (4 mg Intravenous Given 09/24/19 0954)  ondansetron (ZOFRAN) injection 4 mg (4 mg Intravenous Given 09/24/19 0954)  methylPREDNISolone sodium succinate (SOLU-MEDROL) 125 mg/2 mL injection 125 mg (125 mg Intravenous Given 09/24/19 0954)  ipratropium-albuterol (DUONEB) 0.5-2.5 (3) MG/3ML nebulizer solution 3 mL (3 mLs Nebulization Given 09/24/19 1150)  iohexol (OMNIPAQUE) 350 MG/ML injection 100 mL (100 mLs Intravenous Contrast Given 09/24/19 1214)    ED Course  I have reviewed the triage vital signs and the nursing notes.  Pertinent labs & imaging results that were available during my care of the patient were reviewed by me and considered in my medical decision making (see chart for details).    MDM Rules/Calculators/A&P                      UDS done prior to pt receiving morphine.  Pt does not have pna or Covid.  Sob is due to COPD.  Pain  is likely due to multiple bony mets.  Pt is still sob with  any kind of movement.  Pt d/w Dr. Wynetta Emery (triad) for admission.  CRITICAL CARE Performed by: Isla Pence   Total critical care time: 30 minutes  Critical care time was exclusive of separately billable procedures and treating other patients.  Critical care was necessary to treat or prevent imminent or life-threatening deterioration.  Critical care was time spent personally by me on the following activities: development of treatment plan with patient and/or surrogate as well as nursing, discussions with consultants, evaluation of patient's response to treatment, examination of patient, obtaining history from patient or surrogate, ordering and performing treatments and interventions, ordering and review of laboratory studies, ordering and review of radiographic studies, pulse oximetry and re-evaluation of patient's condition.    Final Clinical Impression(s) / ED Diagnoses Final diagnoses:  COPD exacerbation ([place])  Prostate cancer metastatic to bone [facility])    Rx / DC Orders ED Discharge Orders    None       Isla Pence, MD 09/24/19 1453

## 2019-09-24 NOTE — Progress Notes (Signed)
Subjective: Jacob Joseph presents today for follow up of preventative diabetic foot care and painful mycotic nails b/l that are difficult to trim. Pain interferes with ambulation. Aggravating factors include wearing enclosed shoe gear. Pain is relieved with periodic professional debridement.  He relates he has been treated at [facility] for prostate cancer. He has completed radiation.    Allergies  Allergen Reactions  . Lisinopril Swelling  . Statins Other (See Comments)    Elevated liver enzymes.     Objective: There were no vitals filed for this visit.  Vascular Examination:  Capillary refill time to digits immediate b/l. Capillary fill time to digits <3s b/l. Palpable PT pulses b/l. Nonpalpable DP pulses b/l. Pedal hair sparse b/l. Skin temperature gradient within normal limits b/l.  Dermatological Examination: Pedal skin with normal turgor, texture and tone bilaterally. No open wounds bilaterally. No interdigital macerations bilaterally. Toenails 1-5 b/l elongated, dystrophic, thickened, crumbly with subungual debris and tenderness to dorsal palpation.  Musculoskeletal: Normal muscle strength 5/5 to all lower extremity muscle groups bilaterally, no pain crepitus or joint limitation noted with ROM b/l and bunion deformity noted b/l  Neurological: Protective sensation absent with 10g monofilament b/l  Assessment: 1. Pain due to onychomycosis of toenail   2. Type II diabetes mellitus with peripheral circulatory disorder (HCC)    Plan: -Continue diabetic foot care principles daily. -Toenails 1-5 b/l were debrided in length and girth with sterile nail nippers and dremel without iatrogenic bleeding.  -Patient to continue soft, supportive shoe gear daily. -Patient to report any pedal injuries to medical professional immediately. -Patient/POA to call should there be question/concern in the interim.  Return in about 3 months (around 12/20/2019).

## 2019-09-25 ENCOUNTER — Encounter (HOSPITAL_COMMUNITY): Payer: Self-pay | Admitting: Family Medicine

## 2019-09-25 DIAGNOSIS — B20 Human immunodeficiency virus [HIV] disease: Secondary | ICD-10-CM

## 2019-09-25 DIAGNOSIS — Z515 Encounter for palliative care: Secondary | ICD-10-CM

## 2019-09-25 DIAGNOSIS — Z7189 Other specified counseling: Secondary | ICD-10-CM

## 2019-09-25 LAB — GLUCOSE, CAPILLARY
Glucose-Capillary: 176 mg/dL — ABNORMAL HIGH (ref 70–99)
Glucose-Capillary: 179 mg/dL — ABNORMAL HIGH (ref 70–99)
Glucose-Capillary: 280 mg/dL — ABNORMAL HIGH (ref 70–99)
Glucose-Capillary: 323 mg/dL — ABNORMAL HIGH (ref 70–99)
Glucose-Capillary: 181 mg/dL — ABNORMAL HIGH (ref 70–99)

## 2019-09-25 LAB — CBC WITH DIFFERENTIAL/PLATELET
Abs Immature Granulocytes: 0.16 10*3/uL — ABNORMAL HIGH (ref 0.00–0.07)
Basophils Absolute: 0 10*3/uL (ref 0.0–0.1)
Basophils Relative: 0 %
Eosinophils Absolute: 0 10*3/uL (ref 0.0–0.5)
Eosinophils Relative: 0 %
HCT: 27.1 % — ABNORMAL LOW (ref 39.0–52.0)
Hemoglobin: 8.7 g/dL — ABNORMAL LOW (ref 13.0–17.0)
Immature Granulocytes: 1 %
Lymphocytes Relative: 4 %
Lymphs Abs: 0.5 10*3/uL — ABNORMAL LOW (ref 0.7–4.0)
MCH: 25 pg — ABNORMAL LOW (ref 26.0–34.0)
MCHC: 32.1 g/dL (ref 30.0–36.0)
MCV: 77.9 fL — ABNORMAL LOW (ref 80.0–100.0)
Monocytes Absolute: 0.4 10*3/uL (ref 0.1–1.0)
Monocytes Relative: 4 %
Neutro Abs: 10 10*3/uL — ABNORMAL HIGH (ref 1.7–7.7)
Neutrophils Relative %: 91 %
Platelets: 324 10*3/uL (ref 150–400)
RBC: 3.48 MIL/uL — ABNORMAL LOW (ref 4.22–5.81)
RDW: 17.2 % — ABNORMAL HIGH (ref 11.5–15.5)
WBC: 11.1 10*3/uL — ABNORMAL HIGH (ref 4.0–10.5)
nRBC: 0 % (ref 0.0–0.2)

## 2019-09-25 LAB — MAGNESIUM: Magnesium: 2.3 mg/dL (ref 1.7–2.4)

## 2019-09-25 MED ORDER — FENTANYL CITRATE (PF) 100 MCG/2ML IJ SOLN
25.0000 ug | Freq: Once | INTRAMUSCULAR | Status: DC
  Filled 2019-09-25: qty 2

## 2019-09-25 NOTE — Progress Notes (Signed)
Initial Nutrition Assessment  DOCUMENTATION CODES:      INTERVENTION:  Ensure Enlive po BID, each supplement provides 350 kcal and 20 grams of protein   Education  NUTRITION DIAGNOSIS:  Increased nutrient needs related to cancer and cancer related treatments as evidenced by estimated needs.  GOAL:  Patient will meet greater than or equal to 90% of their needs  MONITOR:  PO intake, Supplement acceptance, Labs, Weight trends   REASON FOR ASSESSMENT: Malnutrition Screen      ASSESSMENT: Patient is a 64 yo male from home where he lives with wife. Patient presents with hx of HIV, TB, Stroke, Hepatitis C, DM, COPD (hx of severe exacerbations), crack cocaine use. Chief complaint on arrival- left sided chest pain and shortness of breath a week prior to admission.   Palliative Care consulted.   Unable to ambulate yesterday due to shortness of breath per nursing. Patient says he has finished his 3rd radiation treatment (metastatic prostate cancer) and has not felt like eating since he started treatment in January.  Encouraged meal intake and high protein oral supplement to meet est needs.     RD at bedside while pt is finishing lunch. PO: >75% consumed today and says he is eating better because he doesn't have to smell it cooking. Feeds himself and denies chewing problems. Multiple missing teeth noted.   Medications reviewed and include: lasix, SSI, Prednisone, Protonix.   BMP Latest Ref Rng & Units 09/24/2019 07/19/2019 05/19/2019  Glucose 70 - 99 mg/dL 165(H) 161(H) 120(H)  BUN 8 - 23 mg/dL 17 14 10   Creatinine 0.61 - 1.24 mg/dL 1.09 1.51(H) 1.45(H)  BUN/Creat Ratio 6 - 22 (calc) - - -  Sodium 135 - 145 mmol/L 134(L) 141 140  Potassium 3.5 - 5.1 mmol/L 4.7 3.6 3.3(L)  Chloride 98 - 111 mmol/L 102 107 102  CO2 22 - 32 mmol/L 23 20(L) 21(L)  Calcium 8.9 - 10.3 mg/dL 9.2 9.5 10.0     NUTRITION - FOCUSED PHYSICAL EXAM:    Nutrition-Focused physical exam findings are mild fat  depletion, mild and moderate muscle depletion, and no edema.   Patient has acute weight loss based on chart review and patient report. Significant loss of 8% (8 kg) x 5 weeks and 10 kg (10%) the past 9 weeks.   Diet Order:   Diet Order            Diet Heart Room service appropriate? Yes; Fluid consistency: Thin  Diet effective now              EDUCATION NEEDS:  Education needs have been addressed Skin:  Skin Assessment: Reviewed RN Assessment  Last BM:  3/6  Height:   Ht Readings from Last 1 Encounters:  09/24/19 6' (1.829 m)    Weight:   Wt Readings from Last 1 Encounters:  09/24/19 90.7 kg    Ideal Body Weight:   78 kg  BMI:  Body mass index is 27.12 kg/m.  Estimated Nutritional Needs:   Kcal:  2100-2275  Protein:  101-109 gr  Fluid:  >2 liters daily   Colman Cater MS,RD,CSG, LDN Clinical Nutrition  Pager # in [place]

## 2019-09-25 NOTE — Consult Note (Addendum)
Consultation Note Date: 09/25/2019   Patient Name: Jacob Joseph  DOB: 02/12/1956  MRN: [id number]  Age / Sex: 64 y.o., male  PCP: Neva Seat, MD Referring Physician: Murlean Iba, MD  Reason for Consultation: Establishing goals of care and Psychosocial/spiritual support  HPI/Patient Profile: 64 y.o. male  with past medical history of current crack cocaine user, last used approximately 09/23/2019, HIV on HAART, metastatic prostate cancer with burden to the spine, steroid-dependent COPD, history of PE/DVT on full anticoagulation, HTN, DM2, CKD 3, hep C, depression, history of stroke, PAD, latent TB of the lung in 1988, severe sepsis February 2020, closed fracture of proximal end of left humerus November 2019, inguinal hernia repair 2013 admitted on 09/24/2019 with acute on chronic respiratory failure with hypoxia secondary to COPD exacerbation, metastatic prostate cancer with burden to spine with palliative treatments only.   Clinical Assessment and Goals of Care: [name] is sitting up in the [facility] chair in his room.  He greets me making and mostly keeping eye contact.  He appears chronically ill and frail.  He is alert and oriented, able to make his basic needs known.  There is no family at bedside at this time.  We talked about what brought him into the hospital.  [name] states that he has been experiencing weakness over the last week, having difficulty with mobility.  He talks about his prostate cancer with burden to bone and his treatment plan.  He tells me that hospitalist told him that the cancer was advancing, and he will follow up with his oncologist outpatient.  We talked about expected changes that happen as cancer advances including decreased functional status and mobility.  We talked about home health physical therapy and rehab.  At this point [name] states that his preference is  to have some rehab so he is not a burden to his wife.  He states that overall he would prefer to go home.  We talked about the benefits of at home hospice care.  [name] shares that his thoughts were that hospice was for people who had very short time.  We talked about the tenets of hospice, how they could benefit him through relationship building and supportive care for 6+ months.  I reassure [name] that hospice does nothing to bring death sooner, and in fact will "treat the treatable".  We talked about the benefits of in-home care for making sure that he has symptom management.  We talked about healthcare power of attorney, see below.  We talked about CODE STATUS, see below.  Conference with attending, bedside nursing staff, transition of care team related to patient condition, needs, goals of care.  HCPOA   NEXT OF KIN - [name] names his wife of 21 years, [name] as his Air traffic controller.  [name] tells me that his wife brother, his brother-in-law is also listed as being able to receive healthcare information  SUMMARY OF RECOMMENDATIONS   Full scope/full code Follow-up with oncology outpatient Rehospitalize as needed  Code Status/Advance Care Planning:  Full code -we talked about the concept of "treat the treatable but allowing natural death".  We talked about limitations on life support, and [name] tells me that he would want no more than 1 week on life support.  I encouraged him to discuss his choices/wishes with his healthcare surrogate.  Symptom Management:   Per hospitalist, no additional needs at this time.  Palliative Prophylaxis:   Frequent Pain Assessment  Additional Recommendations (Limitations, Scope, Preferences):  Full Scope Treatment  Psycho-social/Spiritual:   Desire for further Chaplaincy support:no  Additional Recommendations: Caregiving  Support/Resources and Education on Hospice  Prognosis:   Unable to  determine, based on outcomes.  6 months or less would not be surprising based on advancing prostate cancer with metastatic burden to bone, decreasing functional status.  Discharge Planning: To be determined, based on outcomes.      Primary Diagnoses: Present on Admission: . COPD with acute exacerbation ([place]) . HIV disease ([place]) . Smoking greater than 30 pack years . Hypertension . Normocytic anemia . CKD (chronic kidney disease) stage 3, GFR 30-59 ml/min (HCC) . Cataracts, bilateral . Hyperlipidemia . Vitamin D deficiency . Substance abuse ([place]) . Prostate cancer metastatic to multiple sites [facility]) . Spine metastasis ([place]) . Cocaine abuse ([place])   I have reviewed the medical record, interviewed the patient and family, and examined the patient. The following aspects are pertinent.  Past Medical History:  Diagnosis Date  . Abnormal computed tomography of thoracic spine 02/26/2018  . AKI (acute kidney injury) ([place]) 09/24/2016  . BPH associated with nocturia 06/19/2016  . Calcium oxalate crystals in urine 12/23/2011   Asymptomatic, no hematuria. Advised to take plenty of water.  . Calculus of gallbladder without cholecystitis without obstruction   . Closed fracture of proximal end of left humerus with delayed healing 05/26/2018  . COPD (chronic obstructive pulmonary disease) ([place])   . Depression   . Diabetes mellitus without complication ([place]) 123456   pre diabetic  . Hepatitis C   . HIV (human immunodeficiency virus infection) ([place])   . Hypertension   . Leukocytosis 09/01/2018  . PE (pulmonary thromboembolism) ([place]) 05/12/2016   PE in 2017, on chronic anticoagulation for this and recurrent DVT.  [name] Kitchen Peripheral arterial disease ([place])    ooccluded left SFA by duplex ultrasound, tibial disease left greater than right  . Pressure injury of skin 01/13/2019  . Prostate cancer metastatic to multiple sites [facility])   . Right upper quadrant pain 02/26/2018  . Severe sepsis ([place]) 08/31/2018  .  Stroke ([place]) 11/2011   Carotids Doppler negative. Right sided weakness resolved, initially on Plavix for 3 months and then continued with ASA.    . TB lung, latent 1988   Treated   Social History   Socioeconomic History  . Marital status: Married    Spouse name: Not on file  . Number of children: Not on file  . Years of education: Not on file  . Highest education level: Not on file  Occupational History  . Occupation: disabled  Tobacco Use  . Smoking status: Current Every Day Smoker    Packs/day: 0.45    Types: Cigarettes    Last attempt to quit: 11/18/2014    Years since quitting: 4.8  . Smokeless tobacco: Never Used  . Tobacco comment: 5-6 cigs per day; ready to quit  Substance and Sexual Activity  . Alcohol use: No    Alcohol/week: 0.0 standard drinks  . Drug use: Yes  Frequency: 3.0 times per week    Types: "Crack" cocaine, Cocaine    Comment: used 09/23/2019  . Sexual activity: Not on file    Comment: declined condoms  Other Topics Concern  . Not on file  Social History Narrative   Pt lives with wife and grandkid in [place].   Has applied for disability and is pending.   Worked in [place] before about 3 years ago.      Social Determinants of Health   Financial Resource Strain:   . Difficulty of Paying Living Expenses: Not on file  Food Insecurity:   . Worried About Charity fundraiser in the Last Year: Not on file  . Ran Out of Food in the Last Year: Not on file  Transportation Needs:   . Lack of Transportation (Medical): Not on file  . Lack of Transportation (Non-Medical): Not on file  Physical Activity:   . Days of Exercise per Week: Not on file  . Minutes of Exercise per Session: Not on file  Stress:   . Feeling of Stress : Not on file  Social Connections:   . Frequency of Communication with Friends and Family: Not on file  . Frequency of Social Gatherings with Friends and Family: Not on file  . Attends Religious Services: Not on file  . Active  Member of Clubs or Organizations: Not on file  . Attends Archivist Meetings: Not on file  . Marital Status: Not on file   Family History  Problem Relation Age of Onset  . Hypertension Mother   . Heart attack Mother   . Stroke Mother   . Hypertension Father   . Cancer Father    Scheduled Meds: . acetaminophen  650 mg Oral Q6H   Or  . acetaminophen  650 mg Rectal Q6H  . apixaban  2.5 mg Oral BID  . aspirin EC  81 mg Oral Daily  . bictegravir-emtricitabine-tenofovir AF  1 tablet Oral Daily  . cholecalciferol  2,000 Units Oral Daily  . citalopram  30 mg Oral Daily  . diltiazem  120 mg Oral Daily  . doxycycline  100 mg Oral Q12H  . furosemide  30 mg Intravenous Daily  . gabapentin  600 mg Oral BID  . hydrOXYzine  50 mg Oral QHS  . insulin aspart  0-20 Units Subcutaneous TID WC  . insulin aspart  0-5 Units Subcutaneous QHS  . insulin aspart  4 Units Subcutaneous TID WC  . ipratropium-albuterol  3 mL Nebulization QID  . methylPREDNISolone (SOLU-MEDROL) injection  60 mg Intravenous Q6H  . pantoprazole  40 mg Oral Q0600  . pravastatin  40 mg Oral q1800  . QUEtiapine  400 mg Oral QHS  . sodium chloride flush  3 mL Intravenous Q12H  . tamsulosin  0.4 mg Oral QPC breakfast  . valACYclovir  500 mg Oral Daily  . varenicline  0.5 mg Oral Daily   Continuous Infusions: . sodium chloride     PRN Meds:.sodium chloride, azelastine, ipratropium-albuterol, LORazepam, nicotine, ondansetron **OR** ondansetron (ZOFRAN) IV, oxyCODONE, QUEtiapine, sodium chloride flush, traZODone Medications Prior to Admission:  Prior to Admission medications   Medication Sig Start Date End Date Taking? Authorizing Provider  apixaban (ELIQUIS) 2.5 MG TABS tablet Take 1 tablet (2.5 mg total) by mouth 2 (two) times daily. 05/15/19 09/24/19 Yes Neva Seat, MD  aspirin EC 81 MG tablet Take 81 mg by mouth daily.   Yes [provider]  bicalutamide (CASODEX) 50 MG tablet Take 50  mg by mouth  daily.   Yes [provider]  bictegravir-emtricitabine-tenofovir AF (BIKTARVY) 50-200-25 MG TABS tablet Take 1 tablet by mouth daily. 12/21/18  Yes Michel Bickers, MD  CHANTIX 0.5 MG tablet Take 0.5 mg by mouth daily. Then take 1 tablet by mouth twice daily. 03/07/19  Yes [provider]  cholecalciferol (VITAMIN D) 1000 units tablet Take 1 tablet (1,000 Units total) by mouth daily. Patient taking differently: Take 2,000 Units by mouth daily.  01/27/16  Yes Burgess Estelle, MD  ciclopirox (LOPROX) 0.77 % cream APPLY TOPICALLY TO THE AFFECTED AREA TWICE DAILY Patient taking differently: Apply 1 application topically 2 (two) times daily.  09/11/19  Yes Marzetta Board, DPM  diltiazem (DILT-XR) 120 MG 24 hr capsule TAKE 1 CAPSULE(120 MG) BY MOUTH DAILY 08/29/19  Yes Velna Ochs, MD  gabapentin (NEURONTIN) 300 MG capsule Take 2 capsules (600 mg total) by mouth 2 (two) times daily. 08/03/19 09/24/19 Yes Neva Seat, MD  HYDROcodone-acetaminophen (NORCO/VICODIN) 5-325 MG tablet Take 1 tablet by mouth every 12 (twelve) hours as needed for up to 60 doses for moderate pain or severe pain. 08/17/19  Yes Neva Seat, MD  hydrOXYzine (VISTARIL) 50 MG capsule Take 50 mg by mouth at bedtime. 08/28/19  Yes [provider]  Lancets (ONETOUCH DELICA PLUS 123XX123) MISC 2 (two) times daily. 07/17/19  Yes [provider]  metFORMIN (GLUCOPHAGE) 1000 MG tablet Take 1 tablet (1,000 mg total) by mouth 2 (two) times daily with a meal. Patient taking differently: Take 500 mg by mouth 2 (two) times daily with a meal.  05/15/19  Yes Neva Seat, MD  pravastatin (PRAVACHOL) 40 MG tablet TAKE 1 TABLET(40 MG) BY MOUTH DAILY Patient taking differently: Take 40 mg by mouth daily.  06/07/19  Yes Neva Seat, MD  prochlorperazine (COMPAZINE) 10 MG tablet Take 1 tablet (10 mg total) by mouth every 6 (six) hours as needed for nausea or vomiting. 08/15/19  Yes Tyler Pita,  MD  QUEtiapine (SEROQUEL) 400 MG tablet Take 1 tablet (400 mg total) by mouth at bedtime. 08/17/19 09/24/19 Yes Neva Seat, MD  tamsulosin (FLOMAX) 0.4 MG CAPS capsule TAKE 1 CAPSULE(0.4 MG) BY MOUTH DAILY AFTER BREAKFAST Patient taking differently: Take 0.4 mg by mouth daily after breakfast.  10/21/18  Yes Neva Seat, MD  valACYclovir (VALTREX) 500 MG tablet TAKE 1 TABLET BY MOUTH DAILY 05/10/19  Yes Michel Bickers, MD  vitamin E 200 UNIT capsule Take 200 Units by mouth daily.   Yes [provider]  azelastine (ASTELIN) 0.1 % nasal spray USE 1 SPRAY IN EACH NOSTRIL TWICE DAILY AS NEEDED FOR RHINITIS Patient taking differently: Place 1 spray into both nostrils 2 (two) times daily.  02/13/19   Neva Seat, MD  citalopram (CELEXA) 20 MG tablet Take 20 mg by mouth daily. 10/25/14   [provider]  Glory Rosebush VERIO test strip TEST UP TO TWICE DAILY 07/17/19   [provider]   Allergies  Allergen Reactions  . Lisinopril Swelling  . Statins Other (See Comments)    Elevated liver enzymes.   Review of Systems  Unable to perform ROS: Other    Physical Exam Vitals and nursing note reviewed.  Constitutional:      General: He is not in acute distress.    Appearance: He is ill-appearing.  Cardiovascular:     Rate and Rhythm: Normal rate.  Pulmonary:     Effort: Pulmonary effort is normal. No tachypnea.  Abdominal:     General: There  is no abdominal bruit.     Palpations: Abdomen is soft.  Skin:    General: Skin is warm and dry.  Neurological:     Mental Status: He is alert and oriented to person, place, and time.  Psychiatric:     Comments: Calm and cooperative      Vital Signs: BP 105/88 (BP Location: Right Arm)   Pulse 91   Temp 98.3 F (36.8 C) (Oral)   Resp 18   Ht 6' (1.829 m)   Wt 90.7 kg   SpO2 93%   BMI 27.12 kg/m  Pain Scale: 0-10 POSS *See Group Information*: S-Acceptable,Sleep, easy to arouse Pain Score: 4    SpO2: SpO2: 93  % O2 Device:SpO2: 93 % O2 Flow Rate: .O2 Flow Rate (L/min): 3 L/min  IO: Intake/output summary:   Intake/Output Summary (Last 24 hours) at 09/25/2019 1301 Last data filed at 09/25/2019 0600 Gross per 24 hour  Intake 366 ml  Output --  Net 366 ml    LBM: Last BM Date: 09/23/19 Baseline Weight: Weight: 90.7 kg Most recent weight: Weight: 90.7 kg     Palliative Assessment/Data:   Flowsheet Rows     Most Recent Value  Intake Tab  Referral Department  Hospitalist  Unit at Time of Referral  Med/Surg Unit  Palliative Care Primary Diagnosis  Pulmonary  Date Notified  09/25/19  Palliative Care Type  New Palliative care  Reason for referral  Clarify Goals of Care, Advance Care Planning  Date of Admission  09/24/19  Date first seen by Palliative Care  09/25/19  # of days Palliative referral response time  0 Day(s)  # of days IP prior to Palliative referral  1  Clinical Assessment  Palliative Performance Scale Score  50%  Pain Max last 24 hours  Not able to report  Pain Min Last 24 hours  Not able to report  Dyspnea Max Last 24 Hours  Not able to report  Dyspnea Min Last 24 hours  Not able to report  Psychosocial & Spiritual Assessment  Palliative Care Outcomes      Time In: 1040 Time Out: 1150 Time Total: 70 minutes  Greater than 50%  of this time was spent counseling and coordinating care related to the above assessment and plan.  Signed by: Drue Novel, NP   Please contact Palliative Medicine Team phone at [phone] for questions and concerns.  For individual provider: See Shea Evans

## 2019-09-25 NOTE — TOC Initial Note (Signed)
Transition of Care [facility]) - Initial/Assessment Note    Patient Details  Name: Jacob Joseph MRN: [id number] Date of Birth: 16-May-1956  Transition of Care [facility]) CM/SW Contact:    Ihor Gully, LCSW Phone Number: 09/25/2019, 12:16 PM  Clinical Narrative:                 Patient is from home with spouse. At baseline he ambulates with a cane mostly, but has a walker as well. Also, has shower chair and toilet riser. Independent in ADLs overall. Patient utilizes transportation to go to appointments. He is not interested in SNF but is interested in [facility] services. Referrals made to [facility], [place], [place], [facility], [facility], [facility] and [facility]. This may be difficult to arrange due to patient being + for Cocaine and Opiates on admission. Patient is agreeable to SA resources. Advised that he will scheduled appointment independently.  Referral also made to [facility] for possible PCS services.  Expected Discharge Plan: [place] Barriers to Discharge: Continued Medical Work up   Patient Goals and CMS Choice Patient states their goals for this hospitalization and ongoing recovery are:: Patient wants HH.   Choice offered to / list presented to : Patient  Expected Discharge Plan and Services Expected Discharge Plan: [place]       Living arrangements for the past 2 months: Apartment                                      Prior Living Arrangements/Services Living arrangements for the past 2 months: Apartment Lives with:: Spouse Patient language and need for interpreter reviewed:: Yes Do you feel safe going back to the place where you live?: Yes      Need for Family Participation in Patient Care: Yes (Comment) Care giver support system in place?: Yes (comment) Current home services: DME Criminal Activity/Legal Involvement Pertinent to Current Situation/Hospitalization: No - Comment as needed  Activities of Daily Living Home Assistive  Devices/Equipment: Cane (specify quad or straight), Walker (specify type) ADL Screening (condition at time of admission) Patient's cognitive ability adequate to safely complete daily activities?: Yes Is the patient deaf or have difficulty hearing?: No Does the patient have difficulty seeing, even when wearing glasses/contacts?: Yes Does the patient have difficulty concentrating, remembering, or making decisions?: No Patient able to express need for assistance with ADLs?: Yes Does the patient have difficulty dressing or bathing?: Yes Independently performs ADLs?: No Communication: Independent Dressing (OT): Needs assistance Is this a change from baseline?: Pre-admission baseline Grooming: Needs assistance Is this a change from baseline?: Pre-admission baseline Bathing: Independent Toileting: Needs assistance Is this a change from baseline?: Pre-admission baseline In/Out Bed: Needs assistance Is this a change from baseline?: Pre-admission baseline Walks in Home: Needs assistance Is this a change from baseline?: Pre-admission baseline Does the patient have difficulty walking or climbing stairs?: Yes Weakness of Legs: Both Weakness of Arms/Hands: Both  Permission Sought/Granted                  Emotional Assessment Appearance:: Appears stated age   Affect (typically observed): Appropriate Orientation: : Oriented to Self, Oriented to Place, Oriented to  Time, Oriented to Situation Alcohol / Substance Use: Not Applicable Psych Involvement: No (comment)  Admission diagnosis:  Cocaine abuse ([place]) [F14.10] COPD exacerbation ([place]) [J44.1] COPD with acute exacerbation ([place]) [J44.1] Chest pain [R07.9] Prostate cancer metastatic to  bone ([place]) [C61, C79.51] Patient Active Problem List   Diagnosis Date Noted  . COPD with acute exacerbation ([place]) 09/24/2019  . Cocaine abuse ([place]) 09/24/2019  . Xerosis of skin 02/26/2019  . History of angioedema 01/10/2019  . Acute and chronic  respiratory failure with hypoxia ([place]) 08/31/2018  . Spine metastasis ([place]) 03/24/2018  . History of DVT (deep vein thrombosis)   . Prostate cancer metastatic to multiple sites [facility])   . Radicular pain of thoracic region   . Chronic anticoagulation   . Recurrent falls 11/02/2016  . Substance abuse ([place]) 01/27/2016  . Vitamin D deficiency 12/05/2015  . Hyperlipidemia 01/28/2015  . Cataracts, bilateral 08/29/2014  . Diabetes mellitus treated with oral medication ([place]) 10/19/2013  . COPD, moderate ([place]) 05/31/2013  . Complex Lt Renal Cyst 03/22/2013  . Preventive measure 05/30/2012  . History of CVA 03/08/2012  . Normocytic anemia 03/08/2012  . CKD (chronic kidney disease) stage 3, GFR 30-59 ml/min (HCC) 03/08/2012  . ERECTILE DYSFUNCTION 04/25/2008  . HIV disease ([place]) 04/27/2006  . Smoking greater than 30 pack years 04/27/2006  . Major depressive disorder, recurrent episode, in full remission ([place]) 04/27/2006  . Hypertension 04/27/2006  . DEGENERATIVE JOINT DISEASE 04/27/2006  . Chronic Low Back Pain 04/27/2006   PCP:  Neva Seat, MD Pharmacy:   [facility], [place] - [address] AT [address]. [place] [address] Phone: [phone] Fax: [place], [place] AT [address] [place] [address] Phone: [phone] Fax: [phone]     Social Determinants of Health (SDOH) Interventions    Readmission Risk Interventions No flowsheet data found.

## 2019-09-25 NOTE — Evaluation (Addendum)
Physical Therapy Evaluation Patient Details Name: Jacob Joseph MRN: [id number] DOB: 02-May-1956 Today's Date: 09/25/2019   History of Present Illness  Jacob Joseph is a 64 y.o. male smoker and current crack cocaine user, HIV on HAART, BPH, metastatic prostate cancer, depression, steroid dependent COPD, HTN, history of PE on full anticoagulation, type 2 diabetes mellitus, stage 3a CKD, Hepatitis C presents to ED by EMS with complaints of increasing SOB.  He has had severe COPD exacerbations in the past requiring hospitalization and required positive pressure ventilation during last hospitalization.  He reports crack cocaine use yesterday.  He is on chantix and has restarted smoking 1 ppd cigarettes.  He reports that he is receiving palliative radiation treatments for his metastatic prostate cancer with extensive involvement of the thoracolumbar spine. He reports cough, chest congestion and low grade fever.  He reports thick white sputum.  His HIV has been well controlled and recently noted to have undetectable viral load and CD4 count around 300.   He reports left sided chest wall pain with taking a deep breath only.  This has persisted for the last week and nothing seems to help symptoms get better.    Clinical Impression  Patient demonstrates slow labored movement for sitting up at bedside, unable to complete sit to stands without AD due to weakness, required use of RW, limited for ambulation due to frequent leaning to the left, poor standing balance, fall risk and c/o fatgiue.  Patient tolerated sitting up in chair after therapy - nursing staff aware.  Patient will benefit from continued physical therapy in hospital and recommended venue below to increase strength, balance, endurance for safe ADLs and gait. Patient on room air throughout visit and SpO2 at 93-97% during ambulation and when sitting in chair - patient left on room air- nursing staff notified.     Follow Up Recommendations  SNF;Supervision for mobility/OOB;Supervision - Intermittent    Equipment Recommendations  None recommended by PT    Recommendations for Other Services       Precautions / Restrictions Precautions Precautions: Fall Restrictions Weight Bearing Restrictions: No      Mobility  Bed Mobility Overal bed mobility: Needs Assistance Bed Mobility: Supine to Sit     Supine to sit: Min assist     General bed mobility comments: slow labored movement  Transfers Overall transfer level: Needs assistance Equipment used: Rolling walker (2 wheeled) Transfers: Sit to/from [facility] Sit to Stand: Min assist;Mod assist Stand pivot transfers: Min assist;Mod assist       General transfer comment: unable to complete sit to stand without AD due to weakness, required use of RW  Ambulation/Gait Ambulation/Gait assistance: Min assist;Mod assist Gait Distance (Feet): 15 Feet Assistive device: Rolling walker (2 wheeled) Gait Pattern/deviations: Decreased step length - right;Decreased step length - left;Decreased stride length;Decreased weight shift to right;Decreased stance time - left Gait velocity: decreased   General Gait Details: slow labored cadence with frequent leaning to the left, decreased stance time on LLE and limited secondary to c/o fatigue, fair/poor standing balance using RW  Stairs            Wheelchair Mobility    Modified Rankin (Stroke Patients Only)       Balance Overall balance assessment: Needs assistance Sitting-balance support: Feet supported;No upper extremity supported Sitting balance-Leahy Scale: Fair Sitting balance - Comments: seated at bedside   Standing balance support: During functional activity;Bilateral upper extremity supported Standing balance-Leahy Scale: Poor Standing balance comment: fair/poor using RW  Pertinent Vitals/Pain Pain Assessment: No/denies pain    Home Living  Family/patient expects to be discharged to:: Private residence Living Arrangements: Spouse/significant other Available Help at Discharge: Family;Available 24 hours/day Type of Home: Apartment Home Access: Stairs to enter Entrance Stairs-Rails: Right;Left;Can reach both Entrance Stairs-Number of Steps: 3 Home Layout: One level Home Equipment: Cane - single point;Walker - 2 wheels;Shower seat;Bedside commode      Prior Function Level of Independence: Independent with assistive device(s)         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, does not drive     Hand Dominance   Dominant Hand: Right    Extremity/Trunk Assessment   Upper Extremity Assessment Upper Extremity Assessment: Generalized weakness    Lower Extremity Assessment Lower Extremity Assessment: Generalized weakness    Cervical / Trunk Assessment Cervical / Trunk Assessment: Kyphotic  Communication   Communication: No difficulties  Cognition Arousal/Alertness: Awake/alert Behavior During Therapy: WFL for tasks assessed/performed Overall Cognitive Status: Within Functional Limits for tasks assessed                                        General Comments      Exercises     Assessment/Plan    PT Assessment Patient needs continued PT services  PT Problem List Decreased strength;Decreased activity tolerance;Decreased balance;Decreased mobility       PT Treatment Interventions Balance training;Gait training;Stair training;Therapeutic activities;Therapeutic exercise;Patient/family education;Functional mobility training    PT Goals (Current goals can be found in the Care Plan section)  Acute Rehab PT Goals Patient Stated Goal: return home with family to assist after rehab PT Goal Formulation: With patient Time For Goal Achievement: 10/09/19 Potential to Achieve Goals: Good    Frequency Min 3X/week   Barriers to discharge        Co-evaluation               AM-PAC PT "6  Clicks" Mobility  Outcome Measure Help needed turning from your back to your side while in a flat bed without using bedrails?: A Little Help needed moving from lying on your back to sitting on the side of a flat bed without using bedrails?: A Little Help needed moving to and from a bed to a chair (including a wheelchair)?: A Lot Help needed standing up from a chair using your arms (e.g., wheelchair or bedside chair)?: A Lot Help needed to walk in hospital room?: A Lot Help needed climbing 3-5 steps with a railing? : A Lot 6 Click Score: 14    End of Session   Activity Tolerance: Patient tolerated treatment well;Patient limited by fatigue Patient left: in chair;with call bell/phone within reach Nurse Communication: Mobility status PT Visit Diagnosis: Unsteadiness on feet (R26.81);Other abnormalities of gait and mobility (R26.89);Muscle weakness (generalized) (M62.81)    Time: [id number] PT Time Calculation (min) (ACUTE ONLY): 30 min   Charges:   PT Evaluation $PT Eval Moderate Complexity: 1 Mod PT Treatments $Therapeutic Activity: 23-37 mins        12:28 PM, 09/25/19 Lonell Grandchild, MPT Physical Therapist with [facility] [phone] office [phone] mobile phone

## 2019-09-25 NOTE — Plan of Care (Signed)
  Problem: Acute Rehab PT Goals(only PT should resolve) Goal: Pt Will Go Supine/Side To Sit Flowsheets (Taken 09/25/2019 1230) Pt will go Supine/Side to Sit: with supervision Goal: Patient Will Transfer Sit To/From Stand Flowsheets (Taken 09/25/2019 1230) Patient will transfer sit to/from stand: with minimal assist Goal: Pt Will Transfer Bed To Chair/Chair To Bed Flowsheets (Taken 09/25/2019 1230) Pt will Transfer Bed to Chair/Chair to Bed: with min assist Goal: Pt Will Ambulate Flowsheets (Taken 09/25/2019 1230) Pt will Ambulate:  50 feet  with minimal assist  with rolling walker   12:31 PM, 09/25/19 Lonell Grandchild, MPT Physical Therapist with [facility] [phone] office [phone] mobile phone

## 2019-09-25 NOTE — Progress Notes (Signed)
PROGRESS NOTE [facility]   Kumari Abruzzese  [id number]  DOB: May 06, 1956  DOA: 09/24/2019 PCP: Neva Seat, MD  Brief Admission Hx: 64 y.o. male smoker and current crack cocaine user, HIV on HAART, BPH, metastatic prostate cancer, depression, steroid dependent COPD, HTN, history of PE on full anticoagulation, type 2 diabetes mellitus, stage 3a CKD, Hepatitis C presents to ED by EMS with complaints of increasing SOB.  He was admitted with acute COPD exacerbation.   MDM/Assessment & Plan:   1. Acute on chronic respiratory failure with hypoxia  - secondary to COPD exacerbation. He continues to smoke and abuse crack cocaine.  He is high risk for intubation.  He has required bipap therapy in the past.  Will admit for IV steroids, bronchodilators, antibiotics and supportive therapies.  2. Metastatic prostate cancer - he has progressive metastatic disease and treatments are only palliative at this point.  Given the extensive progression of metastases seen on CT imaging I believe that a palliative medicine consult would be beneficial.   3. HIV disease - he is followed by ID clinic and we are resuming his home ARV therapy. His viral load has been undetectable and CD4 count of 300.  4. Essential hypertension - resume home medication and follow. 5. Cocaine abuse - Pt admits to ongoing and recent crack cocaine use.  Will ask TOC to counsel on substance abuse and offer treatment.  6. Type 2 diabetes mellitus with renal complications - SSI coverage and CBG testing, prandial coverage ordered, suspect hyperglycemia secondary to IV steroid usage.  7. Hyperlipidemia - resume home pravastatin.  8. Vitamin D deficiency - continue supplemental vitamin D 2000 IU daily.  9. Tobacco - Pt has restarted smoking cigarettes.  I have offered him a nicotine patch and counseled him on cessation.  However at this time patient does not seem amenable to stopping and seems to accept the consequences.   10. Behavioral health - reported history of bipolar depression on seroquel 50 TID per wife and 400 mg QHS, reordered the night dose and ordered day dose on as needed basis to avoid excess sedation.    DVT prophylaxis: apixaban   Code Status: full   Family Communication: spoke to wife by telephone and reviewed home meds, reached wife at [phone]   Disposition Plan: INP for IV steroids, antibiotics and supportive care, high risk for decompensation requiring escalation of care.   Consults called:   Admission status: INP   Consultants:  Palliative medicine service   Procedures:    Subjective: Pt reports he remains SOB, but denies chest pain, not having severe pain in back from metastatic disease at this time.    Objective: Vitals:   09/25/19 0714 09/25/19 1145 09/25/19 1404 09/25/19 1548  BP:   128/76   Pulse:   (!) 102   Resp:   20   Temp:   97.8 F (36.6 C)   TempSrc:   Oral   SpO2: 94% 93% 96% 97%  Weight:      Height:        Intake/Output Summary (Last 24 hours) at 09/25/2019 1607 Last data filed at 09/25/2019 0600 Gross per 24 hour  Intake 366 ml  Output --  Net 366 ml   Filed Weights   09/24/19 0917  Weight: 90.7 kg     REVIEW OF SYSTEMS  As per history otherwise all reviewed and reported negative  Exam:  General exam: chronically ill appearing male, awake, alert.   Respiratory system: diffuse exp  wheezes and crackles. Moderate increased work of breathing. Cardiovascular system: S1 & S2 heard. No JVD, murmurs, gallops, clicks or pedal edema. Gastrointestinal system: Abdomen is nondistended, soft and nontender. Normal bowel sounds heard. Central nervous system: Alert and oriented. No focal neurological deficits. Extremities: no Cyanosis or clubbing.  Data Reviewed: Basic Metabolic Panel: Recent Labs  Lab 09/24/19 0951 09/25/19 0520  NA 134*  --   K 4.7  --   CL 102  --   CO2 23  --   GLUCOSE 165*  --   BUN 17  --   CREATININE 1.09  --    CALCIUM 9.2  --   MG  --  2.3   Liver Function Tests: No results for input(s): AST, ALT, ALKPHOS, BILITOT, PROT, ALBUMIN in the last 168 hours. No results for input(s): LIPASE, AMYLASE in the last 168 hours. No results for input(s): AMMONIA in the last 168 hours. CBC: Recent Labs  Lab 09/24/19 0951 09/25/19 0520  WBC 9.4 11.1*  NEUTROABS  --  10.0*  HGB 8.6* 8.7*  HCT 27.0* 27.1*  MCV 78.3* 77.9*  PLT 290 324   Cardiac Enzymes: No results for input(s): CKTOTAL, CKMB, CKMBINDEX, TROPONINI in the last 168 hours. CBG (last 3)  Recent Labs    09/25/19 0354 09/25/19 0735 09/25/19 1204  GLUCAP 176* 179* 280*   Recent Results (from the past 240 hour(s))  Culture, blood (routine x 2)     Status: None (Preliminary result)   Collection Time: 09/24/19  9:56 AM   Specimen: BLOOD LEFT WRIST  Result Value Ref Range Status   Specimen Description   Final    BLOOD LEFT WRIST BOTTLES DRAWN AEROBIC AND ANAEROBIC   Special Requests Blood Culture adequate volume  Final   Culture   Final    NO GROWTH < 24 HOURS Performed at [facility], [address] [place], [place] [address]    Report Status PENDING  Incomplete  Respiratory Panel by RT PCR (Flu A&B, Covid) - Nasopharyngeal Swab     Status: None   Collection Time: 09/24/19  9:58 AM   Specimen: Nasopharyngeal Swab  Result Value Ref Range Status   SARS Coronavirus 2 by RT PCR NEGATIVE NEGATIVE Final    Comment: (NOTE) SARS-CoV-2 target nucleic acids are NOT DETECTED. The SARS-CoV-2 RNA is generally detectable in upper respiratoy specimens during the acute phase of infection. The lowest concentration of SARS-CoV-2 viral copies this assay can detect is 131 copies/mL. A negative result does not preclude SARS-Cov-2 infection and should not be used as the sole basis for treatment or other patient management decisions. A negative result may occur with  improper specimen collection/handling, submission of specimen other than  nasopharyngeal swab, presence of viral mutation(s) within the areas targeted by this assay, and inadequate number of viral copies (<131 copies/mL). A negative result must be combined with clinical observations, patient history, and epidemiological information. The expected result is Negative. Fact Sheet for Patients:  PinkCheek.be Fact Sheet for Healthcare Providers:  GravelBags.it This test is not yet ap proved or cleared by the [place] FDA and  has been authorized for detection and/or diagnosis of SARS-CoV-2 by FDA under an Emergency Use Authorization (EUA). This EUA will remain  in effect (meaning this test can be used) for the duration of the COVID-19 declaration under Section 564(b)(1) of the Act, 21 U.S.C. section 360bbb-3(b)(1), unless the authorization is terminated or revoked sooner.    Influenza A by PCR NEGATIVE NEGATIVE Final  Influenza B by PCR NEGATIVE NEGATIVE Final    Comment: (NOTE) The Xpert Xpress SARS-CoV-2/FLU/RSV assay is intended as an aid in  the diagnosis of influenza from Nasopharyngeal swab specimens and  should not be used as a sole basis for treatment. Nasal washings and  aspirates are unacceptable for Xpert Xpress SARS-CoV-2/FLU/RSV  testing. Fact Sheet for Patients: PinkCheek.be Fact Sheet for Healthcare Providers: GravelBags.it This test is not yet approved or cleared by the [place] FDA and  has been authorized for detection and/or diagnosis of SARS-CoV-2 by  FDA under an Emergency Use Authorization (EUA). This EUA will remain  in effect (meaning this test can be used) for the duration of the  Covid-19 declaration under Section 564(b)(1) of the Act, 21  U.S.C. section 360bbb-3(b)(1), unless the authorization is  terminated or revoked. Performed at [facility], [address] [place], [place] [address]   Culture, blood  (routine x 2)     Status: None (Preliminary result)   Collection Time: 09/24/19 10:11 AM   Specimen: BLOOD LEFT HAND  Result Value Ref Range Status   Specimen Description   Final    BLOOD LEFT HAND BOTTLES DRAWN AEROBIC AND ANAEROBIC   Special Requests Blood Culture adequate volume  Final   Culture   Final    NO GROWTH < 24 HOURS Performed at [facility], [address] [place], [place] [address]    Report Status PENDING  Incomplete     Studies: CT Angio Chest PE W and/or Wo Contrast  Result Date: 09/24/2019 CLINICAL DATA:  Centralized chest pain beginning earlier today. Evaluate for pulmonary embolism. History of COPD and metastatic prostate cancer. EXAM: CT ANGIOGRAPHY CHEST WITH CONTRAST TECHNIQUE: Multidetector CT imaging of the chest was performed using the standard protocol during bolus administration of intravenous contrast. Multiplanar CT image reconstructions and MIPs were obtained to evaluate the vascular anatomy. CONTRAST:  134mL OMNIPAQUE IOHEXOL 350 MG/ML SOLN COMPARISON:  02/27/2018; bone scan-04/11/2019 FINDINGS: Vascular Findings: There is suboptimal opacification of the pulmonary arterial system with the main pulmonary artery measuring 279 Hounsfield units. Given this limitation, there no discrete filling defects within the central pulmonary arterial tree to suggest pulmonary embolism. Evaluation of the distal subsegmental pulmonary arteries is degraded secondary to a combination suboptimal vessel opacification as well as patient respiratory artifact. Normal caliber the main pulmonary artery. Cardiomegaly. Coronary artery calcifications. No pericardial effusion. Normal caliber of the thoracic aorta. No evidence of thoracic aortic dissection or periaortic stranding on this nongated examination. Incidental note made of an aortic nipple. There is a minimal amount of atherosclerotic plaque involving the undersurface of the aortic arch. Conventional configuration of the aortic arch. The  branch vessels of the aortic arch appear patent throughout their imaged courses. Review of the MIP images confirms the above findings. ---------------------------------------------------------------------------------- Nonvascular Findings: Mediastinum/Lymph Nodes: Interval increase in size of mediastinal lymph nodes which are numerous though individually not enlarged by size criteria with index left-sided paratracheal lymph node measuring 0.9 cm in greatest short axis diameter (image 34, series 4). No bulky hilar lymphadenopathy. No axillary lymphadenopathy Lungs/Pleura: Bibasilar dependent subpleural ground-glass atelectasis. Subsegmental atelectasis is noted about the bilateral fissures. Geographic pleuroparenchymal thickening about the anterior aspect of the left upper lobe (image 49, series 6), is unchanged compared to the 02/2018 examination and favored to represent scarring. No discrete focal airspace opacities. No pleural effusion or pneumothorax. The central pulmonary airways appear patent. No discrete pulmonary nodules given parenchymal abnormalities and patient respiration artifact. Upper abdomen: Limited  early arterial phase evaluation of the upper abdomen demonstrates diffuse decreased attenuation of the hepatic parenchyma suggestive of hepatic steatosis. Note is made of a punctate (3 mm) gallstone within otherwise normal-appearing gallbladder (image 102, series 4). Hypoattenuating renal cysts are seen bilaterally. Musculoskeletal: Progressive osseous metastatic disease including development of mild (approximately 25%) malignant compression deformities involving the T1, T2, T3, T4 and T11 vertebral bodies without associated retropulsion (representative sagittal image 118, series 8). Note is made of a nondisplaced fracture involving T1 spinous process (sagittal image 116, series 8). Progression of extensive bilateral osseous metastatic disease involving multiple ribs bilaterally including development of a  nondisplaced pathologic fracture involving the anterior aspect of the right fifth rib. (Image 49, series 4) IMPRESSION: 1. No acute cardiopulmonary disease. Specifically, no evidence of pulmonary embolism to the level the bilateral subsegmental pulmonary arteries. 2. Findings compatible with progression prostate cancer including marked progression of osseous metastatic disease with development of mild (approximately 25%) malignant compression deformities involving the T1, T2, T3, T4 and T11 vertebral bodies (without definitive associated retropulsion) and extensive bilateral rib metastasis including a nondisplaced pathologic fracture involving the anterior aspect of the right 5th rib. 3. Prominent though non pathologically enlarged mediastinal lymph nodes, nonspecific though metastatic disease not excluded. 4. Cardiomegaly. 5. Coronary artery calcifications. Aortic Atherosclerosis (ICD10-I70.0). Electronically Signed   By: Sandi Mariscal M.D.   On: 09/24/2019 12:47   DG Chest Port 1 View  Result Date: 09/24/2019 CLINICAL DATA:  Patient c/o left side chest pain with shortness of breath x1 week. Patient does have hx of COPD EXAM: PORTABLE CHEST 1 VIEW COMPARISON:  Chest radiograph 05/18/2019 FINDINGS: Stable cardiomediastinal contours with enlarged heart size. There are scattered bilateral bandlike opacities consistent with scarring or atelectasis. No new consolidation. No pneumothorax or significant pleural effusion. No acute finding in the visualized skeleton. IMPRESSION: Scattered bandlike opacities likely reflecting scarring or atelectasis. Electronically Signed   By: Audie Pinto M.D.   On: 09/24/2019 10:30     Scheduled Meds: . acetaminophen  650 mg Oral Q6H   Or  . acetaminophen  650 mg Rectal Q6H  . apixaban  2.5 mg Oral BID  . aspirin EC  81 mg Oral Daily  . bictegravir-emtricitabine-tenofovir AF  1 tablet Oral Daily  . cholecalciferol  2,000 Units Oral Daily  . citalopram  30 mg Oral Daily   . diltiazem  120 mg Oral Daily  . doxycycline  100 mg Oral Q12H  . furosemide  30 mg Intravenous Daily  . gabapentin  600 mg Oral BID  . hydrOXYzine  50 mg Oral QHS  . insulin aspart  0-20 Units Subcutaneous TID WC  . insulin aspart  0-5 Units Subcutaneous QHS  . insulin aspart  4 Units Subcutaneous TID WC  . ipratropium-albuterol  3 mL Nebulization QID  . methylPREDNISolone (SOLU-MEDROL) injection  60 mg Intravenous Q6H  . pantoprazole  40 mg Oral Q0600  . pravastatin  40 mg Oral q1800  . QUEtiapine  400 mg Oral QHS  . sodium chloride flush  3 mL Intravenous Q12H  . tamsulosin  0.4 mg Oral QPC breakfast  . valACYclovir  500 mg Oral Daily  . varenicline  0.5 mg Oral Daily   Continuous Infusions: . sodium chloride      Principal Problem:   COPD with acute exacerbation (HCC) Active Problems:   Acute and chronic respiratory failure with hypoxia (HCC)   HIV disease (HCC)   Smoking greater than 30 pack years   Hypertension  History of CVA   Normocytic anemia   CKD (chronic kidney disease) stage 3, GFR 30-59 ml/min (HCC)   Diabetes mellitus treated with oral medication ([place])   Cataracts, bilateral   Hyperlipidemia   Vitamin D deficiency   Substance abuse ([place])   Prostate cancer metastatic to bone (HCC)   History of DVT (deep vein thrombosis)   Spine metastasis (HCC)   Cocaine abuse ([place])   Goals of care, counseling/discussion   Palliative care by specialist   DNR (do not resuscitate) discussion   Encounter for hospice care discussion   Time spent:   Irwin Brakeman, MD [facility] 09/25/2019, 4:07 PM    LOS: 1 day  How to contact the [facility] Attending or Consulting provider [place] or covering provider during after hours [place], for this patient?  1. Check the care team in [facility] and look for a) attending/consulting [facility] provider listed and b) the [facility] team listed 2. Log into [email or web address] and use [place] universal password to access. If you do not have the  password, please contact the hospital operator. 3. Locate the [facility] provider you are looking for under [facility] and page to a number that you can be directly reached. 4. If you still have difficulty reaching the provider, please page the [facility] (Director on Call) for the Hospitalists listed on amion for assistance.

## 2019-09-26 ENCOUNTER — Inpatient Hospital Stay (HOSPITAL_COMMUNITY): Payer: 59

## 2019-09-26 LAB — CBC WITH DIFFERENTIAL/PLATELET
Abs Immature Granulocytes: 0.11 10*3/uL — ABNORMAL HIGH (ref 0.00–0.07)
Basophils Absolute: 0 10*3/uL (ref 0.0–0.1)
Basophils Relative: 0 %
Eosinophils Absolute: 0 10*3/uL (ref 0.0–0.5)
Eosinophils Relative: 0 %
HCT: 26.8 % — ABNORMAL LOW (ref 39.0–52.0)
Hemoglobin: 8.6 g/dL — ABNORMAL LOW (ref 13.0–17.0)
Immature Granulocytes: 1 %
Lymphocytes Relative: 4 %
Lymphs Abs: 0.5 10*3/uL — ABNORMAL LOW (ref 0.7–4.0)
MCH: 25 pg — ABNORMAL LOW (ref 26.0–34.0)
MCHC: 32.1 g/dL (ref 30.0–36.0)
MCV: 77.9 fL — ABNORMAL LOW (ref 80.0–100.0)
Monocytes Absolute: 0.5 10*3/uL (ref 0.1–1.0)
Monocytes Relative: 4 %
Neutro Abs: 12.6 10*3/uL — ABNORMAL HIGH (ref 1.7–7.7)
Neutrophils Relative %: 91 %
Platelets: 327 10*3/uL (ref 150–400)
RBC: 3.44 MIL/uL — ABNORMAL LOW (ref 4.22–5.81)
RDW: 17.1 % — ABNORMAL HIGH (ref 11.5–15.5)
WBC: 13.7 10*3/uL — ABNORMAL HIGH (ref 4.0–10.5)
nRBC: 0 % (ref 0.0–0.2)

## 2019-09-26 LAB — BASIC METABOLIC PANEL
Anion gap: 11 (ref 5–15)
BUN: 33 mg/dL — ABNORMAL HIGH (ref 8–23)
CO2: 23 mmol/L (ref 22–32)
Calcium: 9.1 mg/dL (ref 8.9–10.3)
Chloride: 100 mmol/L (ref 98–111)
Creatinine, Ser: 1.16 mg/dL (ref 0.61–1.24)
GFR calc Af Amer: >60 mL/min (ref >60)
GFR calc non Af Amer: >60 mL/min (ref >60)
Glucose, Bld: 259 mg/dL — ABNORMAL HIGH (ref 70–99)
Potassium: 4.3 mmol/L (ref 3.5–5.1)
Sodium: 134 mmol/L — ABNORMAL LOW (ref 135–145)

## 2019-09-26 LAB — GLUCOSE, CAPILLARY
Glucose-Capillary: 237 mg/dL — ABNORMAL HIGH (ref 70–99)
Glucose-Capillary: 230 mg/dL — ABNORMAL HIGH (ref 70–99)
Glucose-Capillary: 247 mg/dL — ABNORMAL HIGH (ref 70–99)
Glucose-Capillary: 254 mg/dL — ABNORMAL HIGH (ref 70–99)
Glucose-Capillary: 207 mg/dL — ABNORMAL HIGH (ref 70–99)

## 2019-09-26 LAB — MAGNESIUM: Magnesium: 2.3 mg/dL (ref 1.7–2.4)

## 2019-09-26 MED ORDER — OXYCODONE HCL 5 MG PO TABS
7.5000 mg | ORAL_TABLET | ORAL | Status: DC | PRN
  Administered 2019-09-27: 7.5 mg via ORAL
  Filled 2019-09-26: qty 2

## 2019-09-26 MED ORDER — HYDROMORPHONE HCL 1 MG/ML IJ SOLN
0.5000 mg | INTRAMUSCULAR | Status: DC | PRN
  Administered 2019-09-26 – 2019-09-27 (×2): 1 mg via INTRAVENOUS
  Administered 2019-09-28: 0.5 mg via INTRAVENOUS
  Filled 2019-09-26: qty 0.5
  Filled 2019-09-26 (×2): qty 1

## 2019-09-26 MED ORDER — INSULIN GLARGINE 100 UNIT/ML ~~LOC~~ SOLN
12.0000 [IU] | SUBCUTANEOUS | Status: DC
  Administered 2019-09-26 – 2019-09-28 (×3): 12 [IU] via SUBCUTANEOUS
  Filled 2019-09-26 (×4): qty 0.12

## 2019-09-26 MED ORDER — IOHEXOL 300 MG/ML  SOLN
75.0000 mL | Freq: Once | INTRAMUSCULAR | Status: AC | PRN
  Administered 2019-09-26: 75 mL via INTRAVENOUS

## 2019-09-26 MED ORDER — METHYLPREDNISOLONE SODIUM SUCC 40 MG IJ SOLR
40.0000 mg | Freq: Every day | INTRAMUSCULAR | Status: DC
  Administered 2019-09-27 – 2019-09-28 (×2): 40 mg via INTRAVENOUS
  Filled 2019-09-26 (×2): qty 1

## 2019-09-26 MED ORDER — HYDROMORPHONE HCL 1 MG/ML IJ SOLN
0.5000 mg | INTRAMUSCULAR | Status: DC | PRN
  Administered 2019-09-26: 18:00:00 0.5 mg via INTRAVENOUS
  Filled 2019-09-26: qty 0.5

## 2019-09-26 NOTE — Progress Notes (Addendum)
Physical Therapy Treatment Patient Details Name: Jacob Joseph MRN: [id number] DOB: [birth year] Today's Date: 09/26/2019    History of Present Illness Jacob Joseph is a 64 y.o. male smoker and current crack cocaine user, HIV on HAART, BPH, metastatic prostate cancer, depression, steroid dependent COPD, HTN, history of PE on full anticoagulation, type 2 diabetes mellitus, stage 3a CKD, Hepatitis C presents to ED by EMS with complaints of increasing SOB.  He has had severe COPD exacerbations in the past requiring hospitalization and required positive pressure ventilation during last hospitalization.  He reports crack cocaine use yesterday.  He is on chantix and has restarted smoking 1 ppd cigarettes.  He reports that he is receiving palliative radiation treatments for his metastatic prostate cancer with extensive involvement of the thoracolumbar spine. He reports cough, chest congestion and low grade fever.  He reports thick white sputum.  His HIV has been well controlled and recently noted to have undetectable viral load and CD4 count around 300.   He reports left sided chest wall pain with taking a deep breath only.  This has persisted for the last week and nothing seems to help symptoms get better.    PT Comments    Patient requires less assistance for sitting up at bedside and demonstrates good return for completing BLE ROM/strengthening exercises while seated at bedside.  Patient limited to ambulation in room due to fatigue and tolerated sitting up in chair after therapy - RN notified.  Patient will benefit from continued physical therapy in hospital and recommended venue below to increase strength, balance, endurance for safe ADLs and gait.   Follow Up Recommendations  SNF;Supervision for mobility/OOB;Supervision - Intermittent     Equipment Recommendations  None recommended by PT    Recommendations for Other Services       Precautions / Restrictions Precautions Precautions:  Fall Restrictions Weight Bearing Restrictions: No    Mobility  Bed Mobility Overal bed mobility: Needs Assistance Bed Mobility: Supine to Sit     Supine to sit: Min guard     General bed mobility comments: increased time, labored movement  Transfers Overall transfer level: Needs assistance Equipment used: Rolling walker (2 wheeled) Transfers: Sit to/from [facility] Sit to Stand: Min assist;Mod assist Stand pivot transfers: Min assist;Mod assist       General transfer comment: increased time, labored movement  Ambulation/Gait Ambulation/Gait assistance: Min assist Gait Distance (Feet): 25 Feet Assistive device: Rolling walker (2 wheeled) Gait Pattern/deviations: Decreased step length - right;Decreased step length - left;Decreased stride length;Decreased stance time - left Gait velocity: decreased   General Gait Details: slightly increased endurance/distance for ambulation with slow labored unsteady cadence, limited mostly due to c/o fatigue   Stairs             Wheelchair Mobility    Modified Rankin (Stroke Patients Only)       Balance Overall balance assessment: Needs assistance Sitting-balance support: Feet supported;No upper extremity supported Sitting balance-Leahy Scale: Fair Sitting balance - Comments: fair/good seated at bedside   Standing balance support: During functional activity;Bilateral upper extremity supported Standing balance-Leahy Scale: Fair Standing balance comment: using RW                            Cognition Arousal/Alertness: Awake/alert Behavior During Therapy: WFL for tasks assessed/performed Overall Cognitive Status: Within Functional Limits for tasks assessed  Exercises General Exercises - Lower Extremity Long Arc Quad: Seated;AROM;Strengthening;Both;10 reps Hip Flexion/Marching: Seated;AROM;Strengthening;Both;10 reps Toe Raises:  Seated;AROM;Strengthening;Both;10 reps Heel Raises: Seated;AROM;Strengthening;Both;10 reps    General Comments        Pertinent Vitals/Pain Pain Assessment: 0-10 Pain Score: 8  Pain Location: low back Pain Descriptors / Indicators: Aching Pain Intervention(s): Limited activity within patient's tolerance;Monitored during session    Home Living                      Prior Function            PT Goals (current goals can now be found in the care plan section) Acute Rehab PT Goals Patient Stated Goal: return home with family to assist after rehab PT Goal Formulation: With patient Time For Goal Achievement: 10/09/19 Potential to Achieve Goals: Good Progress towards PT goals: Progressing toward goals    Frequency    Min 3X/week      PT Plan      Co-evaluation              AM-PAC PT "6 Clicks" Mobility   Outcome Measure  Help needed turning from your back to your side while in a flat bed without using bedrails?: A Little Help needed moving from lying on your back to sitting on the side of a flat bed without using bedrails?: A Little Help needed moving to and from a bed to a chair (including a wheelchair)?: A Lot Help needed standing up from a chair using your arms (e.g., wheelchair or bedside chair)?: A Little Help needed to walk in hospital room?: A Lot Help needed climbing 3-5 steps with a railing? : A Lot 6 Click Score: 15    End of Session   Activity Tolerance: Patient tolerated treatment well;Patient limited by fatigue Patient left: in chair;with call bell/phone within reach Nurse Communication: Mobility status PT Visit Diagnosis: Unsteadiness on feet (R26.81);Other abnormalities of gait and mobility (R26.89);Muscle weakness (generalized) (M62.81)     Time: [id number] PT Time Calculation (min) (ACUTE ONLY): 33 min  Charges:  $Therapeutic Exercise: 8-22 mins $Therapeutic Activity: 8-22 mins                     1:46 PM, 09/26/19 Lonell Grandchild, MPT Physical Therapist with [facility] 336 [phone] office [phone] mobile phone

## 2019-09-26 NOTE — Progress Notes (Signed)
Pt is in bed watching television. He is alert and oriented and denies any pain at this time. MD has rounded. He has received morning breathing treatment, but breathing remains very audible with wheezing. Call bell is within reach.

## 2019-09-26 NOTE — Progress Notes (Addendum)
Palliative: [name] is sitting up in the [place] chair in his room.  He greets me making and keeping eye contact.  He is calm and cooperative, alert and oriented.  He is able to make his basic needs known, but there is no family at bedside at this time.  I asked how he is doing, and [name] tells me that he is very tired, "no better".  We talked about his cancer diagnosis and treatment plan.  [name] tells me that he has 3 radiation treatments left, 1 every 3 to 4 weeks.  [name] brings up a conversation he had with his wife yesterday.  He tells me that she was not happy that he only had "7 days".  I asked him what he means by this, and he tells me that he would only want 7 days trial of life support.  He goes further to share that she told him she wanted him to keep fighting.  I gently share with [name] that he has been fighting, and he nods his head affirmatively.  I share that he must make the choices that are best for him.  We talked about symptom management.  [name] has as needed medications for pain and anxiety, and I encouraged him to use them.  He tells me that he is not sleeping at night due to disturbance, so order made for minimal interruptions at night.  We talked about at home, "treat the treatable" hospice care.  We talked about hospice being experts at symptom management.  We also talked about what hospice can and cannot provide.  I encourage [name] to have an at home visit after discharge with a hospice provider so they can speak to he and his family.  He is agreeable at this time, but unsure of which hospice provider he would choose.  I share that hospice may not be able to accept him into service until after he finishes palliative radiation treatments.  He shares that he has 3 more treatments scheduled 1 every 3 to 4 weeks.  [name] would benefit from scheduled antianxiety and pain medications, but it is unclear who would prescribe these  medications (other than hospice) upon his discharge.  Conference with attending, bedside nursing staff, transition of care team related to patient condition, needs, goals of care discussion. Conference with chaplaincy, referral made.  Exam: Appears chronically ill and frail, thin, normal respiratory rate, no swelling in lower extremities, muscle wasting.  Plan:   Still agreeable to home health.  At this point would like consultation with local hospice for "treat the treatable" care, symptom management.  No choice of hospice provider at this time.  88 minutes Jacob Axe, NP Palliative Medicine Team Team Phone # [phone] Greater than 50% of this time was spent counseling and coordinating care related to the above assessment and plan.

## 2019-09-26 NOTE — Progress Notes (Signed)
09/26/2019 7:05 PM  Came to reassess patient, he says headache is still pretty bad, will adjust pain meds further.    Murvin Natal MD

## 2019-09-26 NOTE — Progress Notes (Signed)
PROGRESS NOTE [facility]   Jacob Joseph  [id number]  DOB: [birth year]  DOA: 09/24/2019 PCP: Neva Seat, MD  Brief Admission Hx: 64 y.o. male smoker and current crack cocaine user, HIV on HAART, BPH, metastatic prostate cancer, depression, steroid dependent COPD, HTN, history of PE on full anticoagulation, type 2 diabetes mellitus, stage 3a CKD, Hepatitis C presents to ED by EMS with complaints of increasing SOB.  He was admitted with acute COPD exacerbation.   MDM/Assessment & Plan:   1. Acute on chronic respiratory failure with hypoxia  - secondary to COPD exacerbation. He continues to smoke and abuse crack cocaine.  He has required bipap therapy in the recent past.  He was admitted for IV steroids, bronchodilators, antibiotics and supportive therapies.  He is slowly improving with treatments but not at baseline.  Will work on ambulating patient today.   2. Metastatic prostate cancer - he has progressive metastatic disease and treatments are only palliative at this point.  Given the extensive progression of metastases seen on CT imaging I believe that a palliative medicine consult would be beneficial for patient and family to discuss goals of care etc.    3. HIV disease - he is followed by ID clinic and we are resuming his home ARV therapy. His viral load has been undetectable and CD4 count of 300.  4. Essential hypertension - resume home medication and follow. 5. Cocaine abuse - Pt admits to ongoing and recent crack cocaine use.  Will ask TOC to counsel on substance abuse and offer treatment.  6. Type 2 diabetes mellitus with renal complications - SSI coverage and CBG testing, prandial coverage ordered, suspect hyperglycemia secondary to IV steroid usage.   His steroids are being reduced.  Added lantus 12 units daily.  Continue frequent monitoring.   7. Hyperlipidemia - resume home pravastatin.  8. Vitamin D deficiency - continue supplemental vitamin D 2000 IU daily.   9. Tobacco - Pt has restarted smoking cigarettes.  I have offered him a nicotine patch and counseled him on cessation.  However at this time patient does not seem amenable to stopping and seems to accept the consequences.  10. Behavioral health - reported history of bipolar depression on seroquel 50 TID per wife and 400 mg QHS, reordered the night dose and ordered day dose on as needed basis only to avoid excess sedation.   11. Ptosis of left eye - pt reports has been present for at least the last week, will order CT head to rule out metastatic prostate cancer to brain.   DVT prophylaxis: apixaban   Code Status: full   Family Communication: spoke to wife by telephone, reached wife at [phone], spoke with his son by telephone on conference with patient at bedside 09/26/19 Disposition Plan: Pt continues to have respiratory distress but slowly improving with IV steroids, antibiotics and supportive care, high risk for decompensation requiring escalation of care, working on ambulating today. Consults called:   Admission status: INP   Consultants:  Palliative medicine service   Procedures:    Subjective: Pt continues to have SOB with minimal exertion, says that the neb treatments are helping  Objective: Vitals:   09/25/19 2028 09/25/19 2147 09/26/19 0522 09/26/19 0820  BP:  (!) 150/88 (!) 154/96   Pulse:  (!) 103 97   Resp:   17   Temp:  98.9 F (37.2 C) 98 F (36.7 C)   TempSrc:  Oral Oral   SpO2: 94% 94% 93% 91%  Weight:  Height:        Intake/Output Summary (Last 24 hours) at 09/26/2019 1231 Last data filed at 09/26/2019 [id number] Gross per 24 hour  Intake 846 ml  Output 950 ml  Net -104 ml   Filed Weights   09/24/19 0917  Weight: 90.7 kg   REVIEW OF SYSTEMS  As per history otherwise all reviewed and reported negative  Exam:  General exam: chronically ill appearing male, awake, alert.  He has ptosis of left eye.  Respiratory system: diffuse exp wheezes persist but  better air movement today and improved crackles. Mild to moderate increased work of breathing. Cardiovascular system: S1 & S2 heard. No JVD, murmurs, gallops, clicks or pedal edema. Gastrointestinal system: Abdomen is nondistended, soft and nontender. Normal bowel sounds heard. Central nervous system: Alert and oriented. No focal neurological deficits. Extremities: no Cyanosis or clubbing.  Data Reviewed: Basic Metabolic Panel: Recent Labs  Lab 09/24/19 0951 09/25/19 0520 09/26/19 0549  NA 134*  --  134*  K 4.7  --  4.3  CL 102  --  100  CO2 23  --  23  GLUCOSE 165*  --  259*  BUN 17  --  33*  CREATININE 1.09  --  1.16  CALCIUM 9.2  --  9.1  MG  --  2.3 2.3   Liver Function Tests: No results for input(s): AST, ALT, ALKPHOS, BILITOT, PROT, ALBUMIN in the last 168 hours. No results for input(s): LIPASE, AMYLASE in the last 168 hours. No results for input(s): AMMONIA in the last 168 hours. CBC: Recent Labs  Lab 09/24/19 0951 09/25/19 0520 09/26/19 0549  WBC 9.4 11.1* 13.7*  NEUTROABS  --  10.0* 12.6*  HGB 8.6* 8.7* 8.6*  HCT 27.0* 27.1* 26.8*  MCV 78.3* 77.9* 77.9*  PLT 290 324 327   Cardiac Enzymes: No results for input(s): CKTOTAL, CKMB, CKMBINDEX, TROPONINI in the last 168 hours. CBG (last 3)  Recent Labs    09/26/19 0409 09/26/19 0721 09/26/19 1131  GLUCAP 237* 230* 247*   Recent Results (from the past 240 hour(s))  Culture, blood (routine x 2)     Status: None (Preliminary result)   Collection Time: 09/24/19  9:56 AM   Specimen: BLOOD LEFT WRIST  Result Value Ref Range Status   Specimen Description   Final    BLOOD LEFT WRIST BOTTLES DRAWN AEROBIC AND ANAEROBIC   Special Requests Blood Culture adequate volume  Final   Culture   Final    NO GROWTH 2 DAYS Performed at [facility], [address] [place], [place] [address]    Report Status PENDING  Incomplete  Respiratory Panel by RT PCR (Flu A&B, Covid) - Nasopharyngeal Swab     Status: None    Collection Time: 09/24/19  9:58 AM   Specimen: Nasopharyngeal Swab  Result Value Ref Range Status   SARS Coronavirus 2 by RT PCR NEGATIVE NEGATIVE Final    Comment: (NOTE) SARS-CoV-2 target nucleic acids are NOT DETECTED. The SARS-CoV-2 RNA is generally detectable in upper respiratoy specimens during the acute phase of infection. The lowest concentration of SARS-CoV-2 viral copies this assay can detect is 131 copies/mL. A negative result does not preclude SARS-Cov-2 infection and should not be used as the sole basis for treatment or other patient management decisions. A negative result may occur with  improper specimen collection/handling, submission of specimen other than nasopharyngeal swab, presence of viral mutation(s) within the areas targeted by this assay, and inadequate number of viral copies (<131  copies/mL). A negative result must be combined with clinical observations, patient history, and epidemiological information. The expected result is Negative. Fact Sheet for Patients:  PinkCheek.be Fact Sheet for Healthcare Providers:  GravelBags.it This test is not yet ap proved or cleared by the [place] FDA and  has been authorized for detection and/or diagnosis of SARS-CoV-2 by FDA under an Emergency Use Authorization (EUA). This EUA will remain  in effect (meaning this test can be used) for the duration of the COVID-19 declaration under Section 564(b)(1) of the Act, 21 U.S.C. section 360bbb-3(b)(1), unless the authorization is terminated or revoked sooner.    Influenza A by PCR NEGATIVE NEGATIVE Final   Influenza B by PCR NEGATIVE NEGATIVE Final    Comment: (NOTE) The Xpert Xpress SARS-CoV-2/FLU/RSV assay is intended as an aid in  the diagnosis of influenza from Nasopharyngeal swab specimens and  should not be used as a sole basis for treatment. Nasal washings and  aspirates are unacceptable for Xpert Xpress  SARS-CoV-2/FLU/RSV  testing. Fact Sheet for Patients: PinkCheek.be Fact Sheet for Healthcare Providers: GravelBags.it This test is not yet approved or cleared by the [place] FDA and  has been authorized for detection and/or diagnosis of SARS-CoV-2 by  FDA under an Emergency Use Authorization (EUA). This EUA will remain  in effect (meaning this test can be used) for the duration of the  Covid-19 declaration under Section 564(b)(1) of the Act, 21  U.S.C. section 360bbb-3(b)(1), unless the authorization is  terminated or revoked. Performed at [facility], [address] [place], [place] [address]   Culture, blood (routine x 2)     Status: None (Preliminary result)   Collection Time: 09/24/19 10:11 AM   Specimen: BLOOD LEFT HAND  Result Value Ref Range Status   Specimen Description   Final    BLOOD LEFT HAND BOTTLES DRAWN AEROBIC AND ANAEROBIC   Special Requests Blood Culture adequate volume  Final   Culture   Final    NO GROWTH 2 DAYS Performed at [facility], [address], [place], [place] [address]    Report Status PENDING  Incomplete     Studies: No results found.   Scheduled Meds: . acetaminophen  650 mg Oral Q6H   Or  . acetaminophen  650 mg Rectal Q6H  . apixaban  2.5 mg Oral BID  . aspirin EC  81 mg Oral Daily  . bictegravir-emtricitabine-tenofovir AF  1 tablet Oral Daily  . cholecalciferol  2,000 Units Oral Daily  . citalopram  30 mg Oral Daily  . diltiazem  120 mg Oral Daily  . doxycycline  100 mg Oral Q12H  . fentaNYL (SUBLIMAZE) injection  25 mcg Intravenous Once  . furosemide  30 mg Intravenous Daily  . gabapentin  600 mg Oral BID  . hydrOXYzine  50 mg Oral QHS  . insulin aspart  0-20 Units Subcutaneous TID WC  . insulin aspart  0-5 Units Subcutaneous QHS  . insulin aspart  4 Units Subcutaneous TID WC  . insulin glargine  12 Units Subcutaneous BH-q7a  . ipratropium-albuterol  3 mL  Nebulization QID  . [START ON 09/27/2019] methylPREDNISolone (SOLU-MEDROL) injection  40 mg Intravenous Daily  . pantoprazole  40 mg Oral Q0600  . pravastatin  40 mg Oral q1800  . QUEtiapine  400 mg Oral QHS  . sodium chloride flush  3 mL Intravenous Q12H  . tamsulosin  0.4 mg Oral QPC breakfast  . valACYclovir  500 mg Oral Daily  . varenicline  0.5 mg Oral Daily   Continuous Infusions: . sodium chloride      Principal Problem:   COPD with acute exacerbation (HCC) Active Problems:   Acute and chronic respiratory failure with hypoxia (HCC)   HIV disease (HCC)   Smoking greater than 30 pack years   Hypertension   History of CVA   Normocytic anemia   CKD (chronic kidney disease) stage 3, GFR 30-59 ml/min (HCC)   Diabetes mellitus treated with oral medication ([place])   Cataracts, bilateral   Hyperlipidemia   Vitamin D deficiency   Substance abuse ([place])   Prostate cancer metastatic to bone (HCC)   History of DVT (deep vein thrombosis)   Spine metastasis (HCC)   Cocaine abuse ([place])   Goals of care, counseling/discussion   Palliative care by specialist   DNR (do not resuscitate) discussion   Encounter for hospice care discussion  Time spent:   Irwin Brakeman, MD [facility] 09/26/2019, 12:31 PM    LOS: 2 days  How to contact the [facility] Attending or Consulting provider [place] or covering provider during after hours 7P -7A, for this patient?  1. Check the care team in [facility] and look for a) attending/consulting TRH provider listed and b) the [facility] team listed 2. Log into [email or web address] and use [place]'s universal password to access. If you do not have the password, please contact the hospital operator. 3. Locate the [facility] provider you are looking for under [facility] and page to a number that you can be directly reached. 4. If you still have difficulty reaching the provider, please page the [facility] (Director on Call) for the Hospitalists listed on amion for assistance.

## 2019-09-26 NOTE — Progress Notes (Addendum)
09/26/2019 5:28 PM   Things have changed.  I received news of the CT brain as part of the work up for newly discovered left eye ptosis.  Pt has a large mass lesion in the central skull base extending to the left and invading the sphenoid sinus, clivus, and cavernous sinus. There is involvement of the left cavernous sinus which could cause ptosis on the left side.  The patient is having headaches.  I reached out to patient's oncologist Dr. Alen Blew and he agreed that palliative care is the most appropriate option at this time and says that patient's prognosis is very poor given these new findings in addition to his other metastatic disease.    I spoke with patient and his wife regarding the findings and giving them time to process all of the new information.  He is having headaches and wants something to help alleviate the pain.  He has orders for anxiety and I have made some adjustments to his pain management regimen.  I have reached out to palliative care medicine.  I am concerned that patient may not have much time left.     I gave patient and his family time to process and expect primary team and palliative care to continue discussions with them tomorrow.     Murvin Natal MD  Attending Physician How to contact the [facility] Attending or Consulting provider [place] or covering provider during after hours [employer], for this patient?  1. Check the care team in [facility] and look for a) attending/consulting TRH provider listed and b) the [facility] team listed 2. Log into [email or web address] and use [place]'s universal password to access. If you do not have the password, please contact the hospital operator. 3. Locate the [facility] provider you are looking for under [facility] and page to a number that you can be directly reached. 4. If you still have difficulty reaching the provider, please page the [facility] (Director on Call) for the Hospitalists listed on amion for assistance.

## 2019-09-26 NOTE — Progress Notes (Signed)
Pt is being transported to CT via [place] by tech. He is alert and oriented and shows no visible signs of distress at this time.

## 2019-09-27 ENCOUNTER — Telehealth: Payer: Self-pay

## 2019-09-27 DIAGNOSIS — J441 Chronic obstructive pulmonary disease with (acute) exacerbation: Principal | ICD-10-CM

## 2019-09-27 LAB — COMPREHENSIVE METABOLIC PANEL
ALT: 34 U/L (ref 0–44)
AST: 32 U/L (ref 15–41)
Albumin: 2.7 g/dL — ABNORMAL LOW (ref 3.5–5.0)
Alkaline Phosphatase: 233 U/L — ABNORMAL HIGH (ref 38–126)
Anion gap: 12 (ref 5–15)
BUN: 32 mg/dL — ABNORMAL HIGH (ref 8–23)
CO2: 25 mmol/L (ref 22–32)
Calcium: 9.5 mg/dL (ref 8.9–10.3)
Chloride: 101 mmol/L (ref 98–111)
Creatinine, Ser: 1.17 mg/dL (ref 0.61–1.24)
GFR calc Af Amer: >60 mL/min (ref >60)
GFR calc non Af Amer: >60 mL/min (ref >60)
Glucose, Bld: 170 mg/dL — ABNORMAL HIGH (ref 70–99)
Potassium: 4.2 mmol/L (ref 3.5–5.1)
Sodium: 138 mmol/L (ref 135–145)
Total Bilirubin: 0.3 mg/dL (ref 0.3–1.2)
Total Protein: 8 g/dL (ref 6.5–8.1)

## 2019-09-27 LAB — GLUCOSE, CAPILLARY
Glucose-Capillary: 170 mg/dL — ABNORMAL HIGH (ref 70–99)
Glucose-Capillary: 169 mg/dL — ABNORMAL HIGH (ref 70–99)
Glucose-Capillary: 203 mg/dL — ABNORMAL HIGH (ref 70–99)
Glucose-Capillary: 262 mg/dL — ABNORMAL HIGH (ref 70–99)
Glucose-Capillary: 174 mg/dL — ABNORMAL HIGH (ref 70–99)

## 2019-09-27 LAB — CBC WITH DIFFERENTIAL/PLATELET
Abs Immature Granulocytes: 0.15 10*3/uL — ABNORMAL HIGH (ref 0.00–0.07)
Basophils Absolute: 0 10*3/uL (ref 0.0–0.1)
Basophils Relative: 0 %
Eosinophils Absolute: 0 10*3/uL (ref 0.0–0.5)
Eosinophils Relative: 0 %
HCT: 30.4 % — ABNORMAL LOW (ref 39.0–52.0)
Hemoglobin: 9.7 g/dL — ABNORMAL LOW (ref 13.0–17.0)
Immature Granulocytes: 2 %
Lymphocytes Relative: 7 %
Lymphs Abs: 0.8 10*3/uL (ref 0.7–4.0)
MCH: 24.9 pg — ABNORMAL LOW (ref 26.0–34.0)
MCHC: 31.9 g/dL (ref 30.0–36.0)
MCV: 78.1 fL — ABNORMAL LOW (ref 80.0–100.0)
Monocytes Absolute: 1 10*3/uL (ref 0.1–1.0)
Monocytes Relative: 10 %
Neutro Abs: 8.2 10*3/uL — ABNORMAL HIGH (ref 1.7–7.7)
Neutrophils Relative %: 81 %
Platelets: 327 10*3/uL (ref 150–400)
RBC: 3.89 MIL/uL — ABNORMAL LOW (ref 4.22–5.81)
RDW: 17.4 % — ABNORMAL HIGH (ref 11.5–15.5)
WBC: 10.1 10*3/uL (ref 4.0–10.5)
nRBC: 0 % (ref 0.0–0.2)

## 2019-09-27 LAB — MAGNESIUM: Magnesium: 2.3 mg/dL (ref 1.7–2.4)

## 2019-09-27 MED ORDER — IPRATROPIUM-ALBUTEROL 0.5-2.5 (3) MG/3ML IN SOLN
3.0000 mL | Freq: Four times a day (QID) | RESPIRATORY_TRACT | Status: DC
  Administered 2019-09-27 – 2019-09-28 (×5): 3 mL via RESPIRATORY_TRACT
  Filled 2019-09-27 (×5): qty 3

## 2019-09-27 MED ORDER — OXYCODONE HCL 5 MG PO TABS
10.0000 mg | ORAL_TABLET | ORAL | Status: DC | PRN
  Administered 2019-09-27 – 2019-09-28 (×4): 10 mg via ORAL
  Filled 2019-09-27 (×4): qty 2

## 2019-09-27 MED ORDER — GUAIFENESIN ER 600 MG PO TB12
600.0000 mg | ORAL_TABLET | Freq: Two times a day (BID) | ORAL | Status: DC
  Administered 2019-09-27 – 2019-09-28 (×3): 600 mg via ORAL
  Filled 2019-09-27 (×3): qty 1

## 2019-09-27 MED ORDER — LORAZEPAM 1 MG PO TABS
1.0000 mg | ORAL_TABLET | Freq: Two times a day (BID) | ORAL | Status: DC
  Administered 2019-09-27 – 2019-09-28 (×3): 1 mg via ORAL
  Filled 2019-09-27 (×3): qty 1

## 2019-09-27 NOTE — TOC Progression Note (Signed)
Transition of Care [facility]) - Progression Note    Patient Details  Name: Jacob Joseph MRN: [id number] Date of Birth: 1955-12-16  Transition of [facility] CM/SW Contact  Ihor Gully, LCSW Phone Number: 09/27/2019, 2:32 PM  Clinical Narrative:    Cassandra at [facility] confirmed receipt of referral.    Expected Discharge Plan: [place] Barriers to Discharge: Continued Medical Work up  Expected Discharge Plan and Services Expected Discharge Plan: [place] arrangements for the past 2 months: Apartment                                       Social Determinants of Health (SDOH) Interventions    Readmission Risk Interventions No flowsheet data found.

## 2019-09-27 NOTE — Progress Notes (Signed)
Palliative: Mr. Danger is lying quietly in bed, his wife is at bedside.  He appears acutely/chronically ill, frail.  He is sleepy at this time, but will make an somewhat keep eye contact when spoken with.   We talked about yesterday's CT head results, and Mrs. Shiffler begins to cry. I share that the medical team will support them, encouraging Mr and Mrs. Wik to consider what this time looks like and feels like for Mr. Rostad.   We talk about the benefits of hospice care.  We talk about at home hospice and also residential hospice if needs.   We talked about symptom management, pain and anxiety medications.  Orders adjusted.  I share that unfortunately, as cancer progresses, so does pain.  I share that hospice will be able to provide excellent symptom management.   Conference with attending, bedside nursing staff, transition of care team related to new diagnosis of metastatic cancer burden to skull, symptom management including pain and anxiety meds, disposition status/needs.  Exam: Appears acutely/chronically ill and frail.  Muscle wasting, no swelling.  Even respiratory pattern.  Plan:   DNR -[place] signed, home with hospice, no equipment needs at this time.  67 minutes Quinn Axe, NP Palliative Medicine Team Team Phone # [phone] Greater than 50% of this time was spent counseling and coordinating care related to the above assessment and plan.

## 2019-09-27 NOTE — Telephone Encounter (Signed)
Jacob Joseph with [facility] requesting to speak with about plan of care. Please call back.

## 2019-09-27 NOTE — Telephone Encounter (Signed)
Pt has hospice referral from adm at [facility] 3/7, [facility] calls for name of an attending md gave dr Trilby Drummer as pcp attending dr Heber [place], provided fax# [phone]

## 2019-09-27 NOTE — Progress Notes (Signed)
Physical Therapy Treatment Patient Details Name: Jacob Joseph MRN: [id number] DOB: 1956-05-14 Today's Date: 09/27/2019    History of Present Illness Jacob Joseph is a 64 y.o. male smoker and current crack cocaine user, HIV on HAART, BPH, metastatic prostate cancer, depression, steroid dependent COPD, HTN, history of PE on full anticoagulation, type 2 diabetes mellitus, stage 3a CKD, Hepatitis C presents to ED by EMS with complaints of increasing SOB.  He has had severe COPD exacerbations in the past requiring hospitalization and required positive pressure ventilation during last hospitalization.  He reports crack cocaine use yesterday.  He is on chantix and has restarted smoking 1 ppd cigarettes.  He reports that he is receiving palliative radiation treatments for his metastatic prostate cancer with extensive involvement of the thoracolumbar spine. He reports cough, chest congestion and low grade fever.  He reports thick white sputum.  His HIV has been well controlled and recently noted to have undetectable viral load and CD4 count around 300.   He reports left sided chest wall pain with taking a deep breath only.  This has persisted for the last week and nothing seems to help symptoms get better.    PT Comments    Pt sitting in chair upon PTA entrance with wife in room.  Reports high LBP scale 8/10 which was monitored through session.  Pt presents with increased fatigue following gait of 10 feet with RW.  Pt presents with antalgic gait mechanics and difficulty advancing Lt LE with verbal cueing instructed.  Per fatigue pt. Requested chair be brought to him.  Mod A for safety with stand to sit, pt attempted to sit without LE contacting chair, educated importance for fall prevention.  EOS pt left in chair with wife in room.  No reports of increased pain.  RN aware of status.     Follow Up Recommendations  SNF;Supervision for mobility/OOB;Supervision - Intermittent     Equipment Recommendations   None recommended by PT    Recommendations for Other Services       Precautions / Restrictions Precautions Precautions: Fall Restrictions Weight Bearing Restrictions: No    Mobility  Bed Mobility               General bed mobility comments: Pt sitting in chair upon entrance  Transfers Overall transfer level: Needs assistance Equipment used: Rolling walker (2 wheeled) Transfers: Sit to/from Stand Sit to Stand: Min assist;Mod assist         General transfer comment: increased time, labored movement.  Cueing for hand placement for safety with STS  Ambulation/Gait Ambulation/Gait assistance: Min assist Gait Distance (Feet): 10 Feet Assistive device: Rolling walker (2 wheeled) Gait Pattern/deviations: Decreased step length - right;Decreased step length - left;Decreased stride length;Decreased stance time - left Gait velocity: decreased   General Gait Details: slightly increased endurance/distance for ambulation with slow labored unsteady cadence, limited mostly due to c/o fatigue   Stairs             Wheelchair Mobility    Modified Rankin (Stroke Patients Only)       Balance                                            Cognition Arousal/Alertness: Awake/alert Behavior During Therapy: WFL for tasks assessed/performed Overall Cognitive Status: Within Functional Limits for tasks assessed  Exercises      General Comments        Pertinent Vitals/Pain Pain Score: 8  Pain Location: low back Pain Descriptors / Indicators: Aching Pain Intervention(s): Limited activity within patient's tolerance;Monitored during session    Home Living                      Prior Function            PT Goals (current goals can now be found in the care plan section)      Frequency    Min 3X/week      PT Plan Current plan remains appropriate    Co-evaluation               AM-PAC PT "6 Clicks" Mobility   Outcome Measure  Help needed turning from your back to your side while in a flat bed without using bedrails?: A Little Help needed moving from lying on your back to sitting on the side of a flat bed without using bedrails?: A Little Help needed moving to and from a bed to a chair (including a wheelchair)?: A Lot Help needed standing up from a chair using your arms (e.g., wheelchair or bedside chair)?: A Little Help needed to walk in hospital room?: A Lot Help needed climbing 3-5 steps with a railing? : A Lot 6 Click Score: 15    End of Session Equipment Utilized During Treatment: Gait belt Activity Tolerance: Patient tolerated treatment well;Patient limited by fatigue Patient left: in chair;with call bell/phone within reach Nurse Communication: Mobility status PT Visit Diagnosis: Unsteadiness on feet (R26.81);Other abnormalities of gait and mobility (R26.89);Muscle weakness (generalized) (M62.81)     Time: 1437-1500 PT Time Calculation (min) (ACUTE ONLY): 23 min  Charges:  $Therapeutic Activity: 23-37 mins                     Ihor Austin, LPTA/CLT; CBIS [phone]  Aldona Lento 09/27/2019, 3:16 PM

## 2019-09-27 NOTE — Progress Notes (Signed)
Patient receiving Xofigo injections from Dr. Tammi Klippel. Informed Dr. Tammi Klippel of large skull base mass identified on CT.

## 2019-09-27 NOTE — Progress Notes (Signed)
PROGRESS NOTE    Jacob Joseph  [id number] DOB: [birth year] DOA: 09/24/2019 PCP: Neva Seat, MD   Brief Narrative:  64 y.o.malesmoker and current crack cocaine user, HIV on HAART, BPH, metastatic prostate cancer, depression, steroid dependent COPD, HTN, history of PE on full anticoagulation, type 2 diabetes mellitus, stage 3a CKD, Hepatitis C presents to ED by EMS with complaints of increasing SOB.  He was admitted with acute COPD exacerbation.   3/10: Patient continues to have steady improvement with his COPD, but unfortunately maintains significant headache with brain mass noted.  He continues to have ptosis to his left eye.  He has been seen by palliative care as well as family with discussion regarding initiation of home hospice services by tomorrow.  Assessment & Plan:   Principal Problem:   COPD with acute exacerbation ([place]) Active Problems:   HIV disease ([place])   Smoking greater than 30 pack years   Hypertension   History of CVA   Normocytic anemia   CKD (chronic kidney disease) stage 3, GFR 30-59 ml/min (HCC)   Diabetes mellitus treated with oral medication ([place])   Cataracts, bilateral   Hyperlipidemia   Vitamin D deficiency   Substance abuse ([place])   Prostate cancer metastatic to bone ([place])   History of DVT (deep vein thrombosis)   Spine metastasis (HCC)   Acute and chronic respiratory failure with hypoxia (HCC)   Cocaine abuse ([place])   Goals of care, counseling/discussion   Palliative care by specialist   DNR (do not resuscitate) discussion   Encounter for hospice care discussion   1. Acute on chronic respiratory failure with hypoxia - secondary to COPD exacerbation. He continues to smoke and abuse crack cocaine. He has required bipap therapy in the recent past. He was admitted for IV steroids, bronchodilators, antibiotics and supportive therapies.  He is slowly improving with treatments but not at baseline.  Will work on ambulating patient today.    2. Metastatic prostate cancer with brain mass- he has progressive metastatic disease and treatments are only palliative at this point. Discussed with palliative care and patient elects for home hospice on discharge.  3. HIV disease - he is followed by ID clinic and we are resuming his home ARV therapy.His viral load has been undetectable and CD4 count of 300. 4. Essential hypertension - resume home medication and follow. 5. Cocaine abuse - Pt admits to ongoing and recent crack cocaine use.Will ask TOC to counsel on substance abuse and offer treatment. 6. Type 2 diabetes mellitus with renal complications - SSI coverage and CBG testing, prandial coverage ordered, suspect hyperglycemia secondary to IV steroid usage.   His steroids are being reduced.  Added lantus 12 units daily.  Continue frequent monitoring.   7. Hyperlipidemia - resume home pravastatin. 8. Vitamin D deficiency - continue supplemental vitamin D 2000 IU daily.  9. Tobacco - Pt has restarted smoking cigarettes. I have offered him a nicotine patch and counseled him on cessation. However at this time patient does not seem amenable to stopping and seems to accept the consequences.  10. Behavioral health - reported history of bipolar depression on seroquel 50 TID per wife and 400 mg QHS, reordered the night dose and ordered day dose on as needed basis only to avoid excess sedation. 11. Ptosis of left eye -related to brain mass above.   DVT prophylaxis: Apixaban Code Status: DNR Family Communication: Palliative has had discussion with family, will plan to call tomorrow Disposition Plan: Anticipate discharge with home hospice  by a.m. once respiratory status improved.   Consultants:   Palliative care  Procedures:   See below  Antimicrobials:  Anti-infectives (From admission, onward)   Start     Dose/Rate Route Frequency Ordered Stop   09/24/19 2000  doxycycline (VIBRA-TABS) tablet 100 mg     100 mg Oral Every 12 hours  09/24/19 1956 09/28/19 0959   09/24/19 1700  bictegravir-emtricitabine-tenofovir AF (BIKTARVY) 50-200-25 MG per tablet 1 tablet     1 tablet Oral Daily 09/24/19 1606     09/24/19 1700  valACYclovir (VALTREX) tablet 500 mg     500 mg Oral Daily 09/24/19 1606         Subjective: Patient seen and evaluated today with ongoing headache noted.  Objective: Vitals:   09/26/19 2205 09/27/19 0501 09/27/19 0825 09/27/19 1603  BP: (!) 148/97 (!) 152/87    Pulse: (!) 103 99    Resp: 18 17    Temp: 98.6 F (37 C) 97.8 F (36.6 C)    TempSrc: Oral Oral    SpO2: 92% 100% 98% 96%  Weight:      Height:        Intake/Output Summary (Last 24 hours) at 09/27/2019 1612 Last data filed at 09/27/2019 1026 Gross per 24 hour  Intake --  Output 650 ml  Net -650 ml   Filed Weights   09/24/19 0917  Weight: 90.7 kg    Examination:  General exam: Appears calm and comfortable, minimal distress Respiratory system: Clear to auscultation. Respiratory effort normal. Cardiovascular system: S1 & S2 heard, RRR. No JVD, murmurs, rubs, gallops or clicks. No pedal edema. Gastrointestinal system: Abdomen is nondistended, soft and nontender. No organomegaly or masses felt. Normal bowel sounds heard. Central nervous system: Alert and awake with left-sided ptosis Extremities: No edema Skin: No rashes, lesions or ulcers Psychiatry: Flat affect.    Data Reviewed: I have personally reviewed following labs and imaging studies  CBC: Recent Labs  Lab 09/24/19 0951 09/25/19 0520 09/26/19 0549 09/27/19 0508  WBC 9.4 11.1* 13.7* 10.1  NEUTROABS  --  10.0* 12.6* 8.2*  HGB 8.6* 8.7* 8.6* 9.7*  HCT 27.0* 27.1* 26.8* 30.4*  MCV 78.3* 77.9* 77.9* 78.1*  PLT 290 324 327 [id number]   Basic Metabolic Panel: Recent Labs  Lab 09/24/19 0951 09/25/19 0520 09/26/19 0549 09/27/19 0508  NA 134*  --  134* 138  K 4.7  --  4.3 4.2  CL 102  --  100 101  CO2 23  --  23 25  GLUCOSE 165*  --  259* 170*  BUN 17  --  33*  32*  CREATININE 1.09  --  1.16 1.17  CALCIUM 9.2  --  9.1 9.5  MG  --  2.3 2.3 2.3   GFR: Estimated Creatinine Clearance: 71.9 mL/min (by C-G formula based on SCr of 1.17 mg/dL). Liver Function Tests: Recent Labs  Lab 09/27/19 0508  AST 32  ALT 34  ALKPHOS 233*  BILITOT 0.3  PROT 8.0  ALBUMIN 2.7*   No results for input(s): LIPASE, AMYLASE in the last 168 hours. No results for input(s): AMMONIA in the last 168 hours. Coagulation Profile: No results for input(s): INR, PROTIME in the last 168 hours. Cardiac Enzymes: No results for input(s): CKTOTAL, CKMB, CKMBINDEX, TROPONINI in the last 168 hours. BNP (last 3 results) No results for input(s): PROBNP in the last 8760 hours. HbA1C: No results for input(s): HGBA1C in the last 72 hours. CBG: Recent Labs  Lab 09/26/19 1646  09/26/19 2157 09/27/19 0344 09/27/19 0746 09/27/19 1112  GLUCAP 254* 207* 170* 169* 203*   Lipid Profile: No results for input(s): CHOL, HDL, LDLCALC, TRIG, CHOLHDL, LDLDIRECT in the last 72 hours. Thyroid Function Tests: No results for input(s): TSH, T4TOTAL, FREET4, T3FREE, THYROIDAB in the last 72 hours. Anemia Panel: No results for input(s): VITAMINB12, FOLATE, FERRITIN, TIBC, IRON, RETICCTPCT in the last 72 hours. Sepsis Labs: Recent Labs  Lab 09/24/19 1011 09/24/19 1247  LATICACIDVEN 1.2 1.0    Recent Results (from the past 240 hour(s))  Culture, blood (routine x 2)     Status: None (Preliminary result)   Collection Time: 09/24/19  9:56 AM   Specimen: BLOOD LEFT WRIST  Result Value Ref Range Status   Specimen Description   Final    BLOOD LEFT WRIST BOTTLES DRAWN AEROBIC AND ANAEROBIC   Special Requests Blood Culture adequate volume  Final   Culture   Final    NO GROWTH 3 DAYS Performed at [facility], [address] [place], [place] [address]    Report Status PENDING  Incomplete  Respiratory Panel by RT PCR (Flu A&B, Covid) - Nasopharyngeal Swab     Status: None   Collection Time:  09/24/19  9:58 AM   Specimen: Nasopharyngeal Swab  Result Value Ref Range Status   SARS Coronavirus 2 by RT PCR NEGATIVE NEGATIVE Final    Comment: (NOTE) SARS-CoV-2 target nucleic acids are NOT DETECTED. The SARS-CoV-2 RNA is generally detectable in upper respiratoy specimens during the acute phase of infection. The lowest concentration of SARS-CoV-2 viral copies this assay can detect is 131 copies/mL. A negative result does not preclude SARS-Cov-2 infection and should not be used as the sole basis for treatment or other patient management decisions. A negative result may occur with  improper specimen collection/handling, submission of specimen other than nasopharyngeal swab, presence of viral mutation(s) within the areas targeted by this assay, and inadequate number of viral copies (<131 copies/mL). A negative result must be combined with clinical observations, patient history, and epidemiological information. The expected result is Negative. Fact Sheet for Patients:  PinkCheek.be Fact Sheet for Healthcare Providers:  GravelBags.it This test is not yet ap proved or cleared by the [place] FDA and  has been authorized for detection and/or diagnosis of SARS-CoV-2 by FDA under an Emergency Use Authorization (EUA). This EUA will remain  in effect (meaning this test can be used) for the duration of the COVID-19 declaration under Section 564(b)(1) of the Act, 21 U.S.C. section 360bbb-3(b)(1), unless the authorization is terminated or revoked sooner.    Influenza A by PCR NEGATIVE NEGATIVE Final   Influenza B by PCR NEGATIVE NEGATIVE Final    Comment: (NOTE) The Xpert Xpress SARS-CoV-2/FLU/RSV assay is intended as an aid in  the diagnosis of influenza from Nasopharyngeal swab specimens and  should not be used as a sole basis for treatment. Nasal washings and  aspirates are unacceptable for Xpert Xpress SARS-CoV-2/FLU/RSV    testing. Fact Sheet for Patients: PinkCheek.be Fact Sheet for Healthcare Providers: GravelBags.it This test is not yet approved or cleared by the [place] FDA and  has been authorized for detection and/or diagnosis of SARS-CoV-2 by  FDA under an Emergency Use Authorization (EUA). This EUA will remain  in effect (meaning this test can be used) for the duration of the  Covid-19 declaration under Section 564(b)(1) of the Act, 21  U.S.C. section 360bbb-3(b)(1), unless the authorization is  terminated or revoked. Performed at [facility],  [address], [place], [place] [address]   Culture, blood (routine x 2)     Status: None (Preliminary result)   Collection Time: 09/24/19 10:11 AM   Specimen: BLOOD LEFT HAND  Result Value Ref Range Status   Specimen Description   Final    BLOOD LEFT HAND BOTTLES DRAWN AEROBIC AND ANAEROBIC   Special Requests Blood Culture adequate volume  Final   Culture   Final    NO GROWTH 3 DAYS Performed at [facility], [address] [place], [place] [address]    Report Status PENDING  Incomplete         Radiology Studies: CT HEAD W & WO CONTRAST  Result Date: 09/26/2019 CLINICAL DATA:  Prostate cancer staging. Focal neuro deficit, left eye ptosis EXAM: CT HEAD WITHOUT AND WITH CONTRAST TECHNIQUE: Contiguous axial images were obtained from the base of the skull through the vertex without and with intravenous contrast CONTRAST:  34mL OMNIPAQUE IOHEXOL 300 MG/ML  SOLN COMPARISON:  CT head 05/18/2019 FINDINGS: Brain: Generalized atrophy. Chronic ischemic changes in the cerebral white matter bilaterally. Negative for acute infarct. Negative for acute hemorrhage. No enhancing brain mass identified. No midline shift. Vascular: Normal arterial flow voids.  Normal venous enhancement. Skull: Large skull base mass. Central skull base shows destruction with ill-defined margins in the central and left side of  the skull base extending into the left cavernous sinus. There is opacification of the sphenoid sinus with destruction of the sphenoid sinus compatible with tumor. Erosive changes are seen in the anterior sella. Erosive change in the clivus. Findings compatible with metastatic disease. Sinuses/Orbits: Soft tissue fills the left sphenoid sinus most likely tumor. Similar changes in the posterior left ethmoid sinus. Remaining sinuses clear. No orbital mass.  Bilateral cataract extraction. Other: None IMPRESSION: 1. Atrophy and chronic white matter ischemia. No acute infarct. No metastatic disease to the brain 2. Large mass lesion in the central skull base extending to the left and invading the sphenoid sinus, clivus, and cavernous sinus. There is involvement of the left cavernous sinus which could cause ptosis on the left side. Electronically Signed   By: Franchot Gallo M.D.   On: 09/26/2019 15:18        Scheduled Meds: . acetaminophen  650 mg Oral Q6H   Or  . acetaminophen  650 mg Rectal Q6H  . apixaban  2.5 mg Oral BID  . aspirin EC  81 mg Oral Daily  . bictegravir-emtricitabine-tenofovir AF  1 tablet Oral Daily  . cholecalciferol  2,000 Units Oral Daily  . citalopram  30 mg Oral Daily  . diltiazem  120 mg Oral Daily  . doxycycline  100 mg Oral Q12H  . fentaNYL (SUBLIMAZE) injection  25 mcg Intravenous Once  . furosemide  30 mg Intravenous Daily  . gabapentin  600 mg Oral BID  . guaiFENesin  600 mg Oral BID  . hydrOXYzine  50 mg Oral QHS  . insulin aspart  0-20 Units Subcutaneous TID WC  . insulin aspart  0-5 Units Subcutaneous QHS  . insulin aspart  4 Units Subcutaneous TID WC  . insulin glargine  12 Units Subcutaneous BH-q7a  . ipratropium-albuterol  3 mL Nebulization Q6H  . LORazepam  1 mg Oral BID  . methylPREDNISolone (SOLU-MEDROL) injection  40 mg Intravenous Daily  . pantoprazole  40 mg Oral Q0600  . pravastatin  40 mg Oral q1800  . QUEtiapine  400 mg Oral QHS  . sodium chloride  flush  3 mL Intravenous  Q12H  . tamsulosin  0.4 mg Oral QPC breakfast  . valACYclovir  500 mg Oral Daily  . varenicline  0.5 mg Oral Daily   Continuous Infusions: . sodium chloride       LOS: 3 days    Time spent: 30 minutes    Kyannah Climer Darleen Crocker, DO [facility] Pager [phone]  If 7PM-7AM, please contact night-coverage [email or web address] Password [facility] 09/27/2019, 4:12 PM

## 2019-09-27 NOTE — Progress Notes (Signed)
Pt resting in room and spouse may return for the night. Charge RN getting permission from [facility]. Pt has been resting all day and new dosing amounts of pain medication appear to be helping to relieve his discomfort. He is alert and oriented and has eaten all meals. Call bell is within reach.

## 2019-09-27 NOTE — TOC Progression Note (Signed)
Transition of Care [facility]) - Progression Note    Patient Details  Name: Camdynn Aldis MRN: [id number] Date of Birth: 06-13-1956  Transition of [facility]) CM/SW Contact  Ihor Gully, LCSW Phone Number: 09/27/2019, 12:29 PM  Clinical Narrative:    Per palliative care NP, Quinn Axe, patient is agreeable to hospice services at his home. Spoke with patient discussed Hospice choices. Referral made.    Expected Discharge Plan: [place] Barriers to Discharge: Continued Medical Work up  Expected Discharge Plan and Services Expected Discharge Plan: [place] arrangements for the past 2 months: Apartment                                       Social Determinants of Health (SDOH) Interventions    Readmission Risk Interventions No flowsheet data found.

## 2019-09-27 NOTE — Care Management Important Message (Signed)
Important Message  Patient Details  Name: Jacob Joseph MRN: [id number] Date of Birth: 22-Jun-1956   Medicare Important Message Given:  Yes     Tommy Medal 09/27/2019, 12:20 PM

## 2019-09-28 LAB — CBC WITH DIFFERENTIAL/PLATELET
Abs Immature Granulocytes: 0.12 10*3/uL — ABNORMAL HIGH (ref 0.00–0.07)
Basophils Absolute: 0 10*3/uL (ref 0.0–0.1)
Basophils Relative: 0 %
Eosinophils Absolute: 0 10*3/uL (ref 0.0–0.5)
Eosinophils Relative: 0 %
HCT: 30 % — ABNORMAL LOW (ref 39.0–52.0)
Hemoglobin: 9.5 g/dL — ABNORMAL LOW (ref 13.0–17.0)
Immature Granulocytes: 1 %
Lymphocytes Relative: 8 %
Lymphs Abs: 0.9 10*3/uL (ref 0.7–4.0)
MCH: 24.6 pg — ABNORMAL LOW (ref 26.0–34.0)
MCHC: 31.7 g/dL (ref 30.0–36.0)
MCV: 77.7 fL — ABNORMAL LOW (ref 80.0–100.0)
Monocytes Absolute: 1.2 10*3/uL — ABNORMAL HIGH (ref 0.1–1.0)
Monocytes Relative: 10 %
Neutro Abs: 9.1 10*3/uL — ABNORMAL HIGH (ref 1.7–7.7)
Neutrophils Relative %: 81 %
Platelets: 302 10*3/uL (ref 150–400)
RBC: 3.86 MIL/uL — ABNORMAL LOW (ref 4.22–5.81)
RDW: 17.2 % — ABNORMAL HIGH (ref 11.5–15.5)
WBC: 11.3 10*3/uL — ABNORMAL HIGH (ref 4.0–10.5)
nRBC: 0 % (ref 0.0–0.2)

## 2019-09-28 LAB — COMPREHENSIVE METABOLIC PANEL
ALT: 32 U/L (ref 0–44)
AST: 29 U/L (ref 15–41)
Albumin: 2.7 g/dL — ABNORMAL LOW (ref 3.5–5.0)
Alkaline Phosphatase: 209 U/L — ABNORMAL HIGH (ref 38–126)
Anion gap: 11 (ref 5–15)
BUN: 29 mg/dL — ABNORMAL HIGH (ref 8–23)
CO2: 21 mmol/L — ABNORMAL LOW (ref 22–32)
Calcium: 8.9 mg/dL (ref 8.9–10.3)
Chloride: 100 mmol/L (ref 98–111)
Creatinine, Ser: 1.14 mg/dL (ref 0.61–1.24)
GFR calc Af Amer: >60 mL/min (ref >60)
GFR calc non Af Amer: >60 mL/min (ref >60)
Glucose, Bld: 213 mg/dL — ABNORMAL HIGH (ref 70–99)
Potassium: 4.8 mmol/L (ref 3.5–5.1)
Sodium: 132 mmol/L — ABNORMAL LOW (ref 135–145)
Total Bilirubin: 0.8 mg/dL (ref 0.3–1.2)
Total Protein: 7.9 g/dL (ref 6.5–8.1)

## 2019-09-28 LAB — GLUCOSE, CAPILLARY
Glucose-Capillary: 211 mg/dL — ABNORMAL HIGH (ref 70–99)
Glucose-Capillary: 208 mg/dL — ABNORMAL HIGH (ref 70–99)
Glucose-Capillary: 192 mg/dL — ABNORMAL HIGH (ref 70–99)
Glucose-Capillary: 205 mg/dL — ABNORMAL HIGH (ref 70–99)

## 2019-09-28 MED ORDER — OXYCODONE HCL 10 MG PO TABS: 10.0000 mg | ORAL_TABLET | ORAL | 0 refills | Status: AC | PRN

## 2019-09-28 MED ORDER — NICOTINE 14 MG/24HR TD PT24: 14.0000 mg | MEDICATED_PATCH | Freq: Every day | TRANSDERMAL | 0 refills | Status: AC

## 2019-09-28 MED ORDER — PANTOPRAZOLE SODIUM 40 MG PO TBEC: 40.0000 mg | DELAYED_RELEASE_TABLET | Freq: Every day | ORAL | 0 refills | Status: AC

## 2019-09-28 MED ORDER — LORAZEPAM 1 MG PO TABS: 1.0000 mg | ORAL_TABLET | Freq: Two times a day (BID) | ORAL | 0 refills | Status: AC

## 2019-09-28 MED ORDER — PREDNISONE 20 MG PO TABS: 40.0000 mg | ORAL_TABLET | Freq: Every day | ORAL | 0 refills | Status: AC

## 2019-09-28 MED ORDER — COMBIVENT RESPIMAT 20-100 MCG/ACT IN AERS: 1.0000 | INHALATION_SPRAY | Freq: Four times a day (QID) | RESPIRATORY_TRACT | 0 refills | Status: AC | PRN

## 2019-09-28 MED ORDER — GUAIFENESIN ER 600 MG PO TB12: 600.0000 mg | ORAL_TABLET | Freq: Two times a day (BID) | ORAL | 0 refills | Status: AC

## 2019-09-28 NOTE — TOC Transition Note (Signed)
Transition of Care [facility]) - CM/SW Discharge Note   Patient Details  Name: Jacob Joseph MRN: [id number] Date of Birth: May 17, 1956  [facility] CM/SW Contact:  Ihor Gully, LCSW Phone Number: 09/28/2019, 10:39 AM   Clinical Narrative:    Cassandra with [facility] notified of discharge. Cassandra made contact with spouse and spouse desires hospital bed for patient. Hospice will order hospital bed. TOC signing off.    Final next level of care: Home w Hospice Care Barriers to Discharge: No Barriers Identified   Patient Goals and CMS Choice Patient states their goals for this hospitalization and ongoing recovery are:: Patient wants HH.   Choice offered to / list presented to : Patient  Discharge Placement                       Discharge Plan and Services                                     Social Determinants of Health (SDOH) Interventions     Readmission Risk Interventions No flowsheet data found.

## 2019-09-28 NOTE — Progress Notes (Signed)
IV removed. D/C instructions reviewed with patient and wife, verbalized understanding. [facility] to transport at wife's request due to patient unable to ambulate. [place] notified, will continue to monitor.

## 2019-09-28 NOTE — Progress Notes (Addendum)
Inpatient Diabetes Program Recommendations  AACE/ADA: New Consensus Statement on Inpatient Glycemic Control (2015)  Target Ranges:  Prepandial:   less than 140 mg/dL      Peak postprandial:   less than 180 mg/dL (1-2 hours)      Critically ill patients:  140 - 180 mg/dL   Results for KENSON, MOLA (MRN [id number]) as of 09/28/2019 08:46  Ref. Range 09/27/2019 07:46 09/27/2019 11:12 09/27/2019 16:15 09/27/2019 20:35  Glucose-Capillary Latest Ref Range: 70 - 99 mg/dL 169 (H)  8 units NOVOLOG +  12 units LANTUS  203 (H)  11 units NOVOLOG  262 (H)  15 units NOVOLOG  174 (H)   Results for NISHAWN, HERSTON (MRN [id number]) as of 09/28/2019 08:46  Ref. Range 09/28/2019 07:28  Glucose-Capillary Latest Ref Range: 70 - 99 mg/dL 208 (H)    Home DM Meds: Metformin 500 mg BID   Current Orders: Lantus 12 units Daily      Novolog Resistant Correction Scale/ SSI (0-20 units) TID AC + HS      Novolog 4 units TID with meals     MD- Note patient getting Solumedrol 40 mg Daily.  Still having elevated CBGs despite addition of Insulin.  If pt remains on IV Steroids and remains in hospital, please consider:  Increase Novolog Meal Coverage to: Novolog 6 units TID with meals      --Will follow patient during hospitalization--  Wyn Quaker RN, MSN, CDE Diabetes Coordinator Inpatient Glycemic Control Team Team Pager: [phone] (8a-5p)

## 2019-09-28 NOTE — Discharge Summary (Signed)
Physician Discharge Summary  Jacob Joseph [id number] DOB: July 04, 1956 DOA: 09/24/2019  PCP: Neva Seat, MD  Admit date: 09/24/2019  Discharge date: 09/28/2019  Admitted From:Home  Disposition:  Home with hospice  Recommendations for Outpatient Follow-up:  1. Follow up with home hospice services 2. Continue on pain and anxiety medications as prescribed below 3. Continue on prednisone for 5 more days as prescribed along with Combivent as needed for shortness of breath or wheezing  Home Health:Home hospice  Equipment/Devices:None  Discharge Condition:Stable  CODE STATUS: DNR  Diet recommendation: Regular  Brief/Interim Summary: 64 y.o.malesmoker and current crack cocaine user, HIV on HAART, BPH, metastatic prostate cancer, depression, steroid dependent COPD, HTN, history of PE on full anticoagulation, type 2 diabetes mellitus, stage 3a CKD, Hepatitis C presents to ED by EMS with complaints of increasing SOB. He was admitted with acute COPD exacerbation.   3/10: Patient continues to have steady improvement with his COPD, but unfortunately maintains significant headache with brain mass that was noted on CT scan on 3/9.  He continues to have ptosis to his left eye.  He has been seen by palliative care as well as family with discussion regarding initiation of home hospice services by tomorrow.  3/11: He has been on scheduled medications to include oxycodone as well as Ativan with improvement in his headaches noted.  He is otherwise stable for discharge today to home hospice services.  He is to continue medications as prescribed such as oral prednisone for 5 more days as well as Combivent as needed for shortness of breath or wheezing.  He has also been given prescription for oxycodone and Ativan for home hospice care.  Discharge Diagnoses:  Principal Problem:   COPD with acute exacerbation ([place]) Active Problems:   HIV disease ([place])   Smoking greater than 30 pack years    Hypertension   History of CVA   Normocytic anemia   CKD (chronic kidney disease) stage 3, GFR 30-59 ml/min (HCC)   Diabetes mellitus treated with oral medication ([place])   Cataracts, bilateral   Hyperlipidemia   Vitamin D deficiency   Substance abuse ([place])   Prostate cancer metastatic to bone ([place])   History of DVT (deep vein thrombosis)   Spine metastasis (HCC)   Acute and chronic respiratory failure with hypoxia (HCC)   Cocaine abuse ([place])   Goals of care, counseling/discussion   Palliative care by specialist   DNR (do not resuscitate) discussion   Encounter for hospice care discussion  Principal discharge diagnosis: Acute on chronic hypoxemic respiratory failure secondary to COPD exacerbation.  Brain mass in the setting of metastatic prostate cancer with poor long-term prognosis.  Discharge Instructions  Discharge Instructions    Diet - low sodium heart healthy   Complete by: As directed    Increase activity slowly   Complete by: As directed      Allergies as of 09/28/2019      Reactions   Lisinopril Swelling   Statins Other (See Comments)   Elevated liver enzymes.      Medication List    STOP taking these medications   apixaban 2.5 MG Tabs tablet Commonly known as: ELIQUIS   bicalutamide 50 MG tablet Commonly known as: CASODEX   metFORMIN 1000 MG tablet Commonly known as: GLUCOPHAGE   pravastatin 40 MG tablet Commonly known as: PRAVACHOL     TAKE these medications   aspirin EC 81 MG tablet Take 81 mg by mouth daily.   azelastine 0.1 % nasal spray Commonly known  as: ASTELIN USE 1 SPRAY IN EACH NOSTRIL TWICE DAILY AS NEEDED FOR RHINITIS What changed: See the new instructions.   bictegravir-emtricitabine-tenofovir AF 50-200-25 MG Tabs tablet Commonly known as: BIKTARVY Take 1 tablet by mouth daily.   Chantix 0.5 MG tablet Generic drug: varenicline Take 0.5 mg by mouth daily. Then take 1 tablet by mouth twice daily.   cholecalciferol 1000 units  tablet Commonly known as: VITAMIN D Take 1 tablet (1,000 Units total) by mouth daily. What changed: how much to take   ciclopirox 0.77 % cream Commonly known as: LOPROX APPLY TOPICALLY TO THE AFFECTED AREA TWICE DAILY What changed: See the new instructions.   citalopram 20 MG tablet Commonly known as: CELEXA Take 20 mg by mouth daily.   Combivent Respimat 20-100 MCG/ACT Aers respimat Generic drug: Ipratropium-Albuterol Inhale 1 puff into the lungs every 6 (six) hours as needed for wheezing.   diltiazem 120 MG 24 hr capsule Commonly known as: Dilt-XR TAKE 1 CAPSULE(120 MG) BY MOUTH DAILY   gabapentin 300 MG capsule Commonly known as: NEURONTIN Take 2 capsules (600 mg total) by mouth 2 (two) times daily.   guaiFENesin 600 MG 12 hr tablet Commonly known as: MUCINEX Take 1 tablet (600 mg total) by mouth 2 (two) times daily for 10 days.   HYDROcodone-acetaminophen 5-325 MG tablet Commonly known as: NORCO/VICODIN Take 1 tablet by mouth every 12 (twelve) hours as needed for up to 60 doses for moderate pain or severe pain.   hydrOXYzine 50 MG capsule Commonly known as: VISTARIL Take 50 mg by mouth at bedtime.   LORazepam 1 MG tablet Commonly known as: ATIVAN Take 1 tablet (1 mg total) by mouth 2 (two) times daily.   OneTouch Delica Plus 0000000 Misc 2 (two) times daily.   OneTouch Verio test strip Generic drug: glucose blood TEST UP TO TWICE DAILY   Oxycodone HCl 10 MG Tabs Take 1 tablet (10 mg total) by mouth every 4 (four) hours as needed for moderate pain.   pantoprazole 40 MG tablet Commonly known as: PROTONIX Take 1 tablet (40 mg total) by mouth daily at 6 (six) AM for 10 days. Start taking on: September 29, 2019   predniSONE 20 MG tablet Commonly known as: Deltasone Take 2 tablets (40 mg total) by mouth daily for 5 days.   prochlorperazine 10 MG tablet Commonly known as: COMPAZINE Take 1 tablet (10 mg total) by mouth every 6 (six) hours as needed for nausea  or vomiting.   QUEtiapine 400 MG tablet Commonly known as: SEROQUEL Take 1 tablet (400 mg total) by mouth at bedtime.   tamsulosin 0.4 MG Caps capsule Commonly known as: FLOMAX TAKE 1 CAPSULE(0.4 MG) BY MOUTH DAILY AFTER BREAKFAST What changed: See the new instructions.   valACYclovir 500 MG tablet Commonly known as: VALTREX TAKE 1 TABLET BY MOUTH DAILY   vitamin E 200 UNIT capsule Take 200 Units by mouth daily.      Follow-up Information    Neva Seat, MD Follow up in 1 week(s).   Specialty: Internal Medicine Contact information: [place] [address] [phone]          Allergies  Allergen Reactions  . Lisinopril Swelling  . Statins Other (See Comments)    Elevated liver enzymes.    Consultations:  Palliative care   Procedures/Studies: CT HEAD W & WO CONTRAST  Result Date: 09/26/2019 CLINICAL DATA:  Prostate cancer staging. Focal neuro deficit, left eye ptosis EXAM: CT HEAD WITHOUT AND WITH  CONTRAST TECHNIQUE: Contiguous axial images were obtained from the base of the skull through the vertex without and with intravenous contrast CONTRAST:  41mL OMNIPAQUE IOHEXOL 300 MG/ML  SOLN COMPARISON:  CT head 05/18/2019 FINDINGS: Brain: Generalized atrophy. Chronic ischemic changes in the cerebral white matter bilaterally. Negative for acute infarct. Negative for acute hemorrhage. No enhancing brain mass identified. No midline shift. Vascular: Normal arterial flow voids.  Normal venous enhancement. Skull: Large skull base mass. Central skull base shows destruction with ill-defined margins in the central and left side of the skull base extending into the left cavernous sinus. There is opacification of the sphenoid sinus with destruction of the sphenoid sinus compatible with tumor. Erosive changes are seen in the anterior sella. Erosive change in the clivus. Findings compatible with metastatic disease. Sinuses/Orbits: Soft tissue fills the left sphenoid sinus  most likely tumor. Similar changes in the posterior left ethmoid sinus. Remaining sinuses clear. No orbital mass.  Bilateral cataract extraction. Other: None IMPRESSION: 1. Atrophy and chronic white matter ischemia. No acute infarct. No metastatic disease to the brain 2. Large mass lesion in the central skull base extending to the left and invading the sphenoid sinus, clivus, and cavernous sinus. There is involvement of the left cavernous sinus which could cause ptosis on the left side. Electronically Signed   By: Franchot Gallo M.D.   On: 09/26/2019 15:18   CT Angio Chest PE W and/or Wo Contrast  Result Date: 09/24/2019 CLINICAL DATA:  Centralized chest pain beginning earlier today. Evaluate for pulmonary embolism. History of COPD and metastatic prostate cancer. EXAM: CT ANGIOGRAPHY CHEST WITH CONTRAST TECHNIQUE: Multidetector CT imaging of the chest was performed using the standard protocol during bolus administration of intravenous contrast. Multiplanar CT image reconstructions and MIPs were obtained to evaluate the vascular anatomy. CONTRAST:  116mL OMNIPAQUE IOHEXOL 350 MG/ML SOLN COMPARISON:  02/27/2018; bone scan-04/11/2019 FINDINGS: Vascular Findings: There is suboptimal opacification of the pulmonary arterial system with the main pulmonary artery measuring 279 Hounsfield units. Given this limitation, there no discrete filling defects within the central pulmonary arterial tree to suggest pulmonary embolism. Evaluation of the distal subsegmental pulmonary arteries is degraded secondary to a combination suboptimal vessel opacification as well as patient respiratory artifact. Normal caliber the main pulmonary artery. Cardiomegaly. Coronary artery calcifications. No pericardial effusion. Normal caliber of the thoracic aorta. No evidence of thoracic aortic dissection or periaortic stranding on this nongated examination. Incidental note made of an aortic nipple. There is a minimal amount of atherosclerotic  plaque involving the undersurface of the aortic arch. Conventional configuration of the aortic arch. The branch vessels of the aortic arch appear patent throughout their imaged courses. Review of the MIP images confirms the above findings. ---------------------------------------------------------------------------------- Nonvascular Findings: Mediastinum/Lymph Nodes: Interval increase in size of mediastinal lymph nodes which are numerous though individually not enlarged by size criteria with index left-sided paratracheal lymph node measuring 0.9 cm in greatest short axis diameter (image 34, series 4). No bulky hilar lymphadenopathy. No axillary lymphadenopathy Lungs/Pleura: Bibasilar dependent subpleural ground-glass atelectasis. Subsegmental atelectasis is noted about the bilateral fissures. Geographic pleuroparenchymal thickening about the anterior aspect of the left upper lobe (image 49, series 6), is unchanged compared to the 02/2018 examination and favored to represent scarring. No discrete focal airspace opacities. No pleural effusion or pneumothorax. The central pulmonary airways appear patent. No discrete pulmonary nodules given parenchymal abnormalities and patient respiration artifact. Upper abdomen: Limited early arterial phase evaluation of the upper abdomen demonstrates diffuse decreased attenuation of the  hepatic parenchyma suggestive of hepatic steatosis. Note is made of a punctate (3 mm) gallstone within otherwise normal-appearing gallbladder (image 102, series 4). Hypoattenuating renal cysts are seen bilaterally. Musculoskeletal: Progressive osseous metastatic disease including development of mild (approximately 25%) malignant compression deformities involving the T1, T2, T3, T4 and T11 vertebral bodies without associated retropulsion (representative sagittal image 118, series 8). Note is made of a nondisplaced fracture involving T1 spinous process (sagittal image 116, series 8). Progression of  extensive bilateral osseous metastatic disease involving multiple ribs bilaterally including development of a nondisplaced pathologic fracture involving the anterior aspect of the right fifth rib. (Image 49, series 4) IMPRESSION: 1. No acute cardiopulmonary disease. Specifically, no evidence of pulmonary embolism to the level the bilateral subsegmental pulmonary arteries. 2. Findings compatible with progression prostate cancer including marked progression of osseous metastatic disease with development of mild (approximately 25%) malignant compression deformities involving the T1, T2, T3, T4 and T11 vertebral bodies (without definitive associated retropulsion) and extensive bilateral rib metastasis including a nondisplaced pathologic fracture involving the anterior aspect of the right 5th rib. 3. Prominent though non pathologically enlarged mediastinal lymph nodes, nonspecific though metastatic disease not excluded. 4. Cardiomegaly. 5. Coronary artery calcifications. Aortic Atherosclerosis (ICD10-I70.0). Electronically Signed   By: Sandi Mariscal M.D.   On: 09/24/2019 12:47   DG Chest Port 1 View  Result Date: 09/24/2019 CLINICAL DATA:  Patient c/o left side chest pain with shortness of breath x1 week. Patient does have hx of COPD EXAM: PORTABLE CHEST 1 VIEW COMPARISON:  Chest radiograph 05/18/2019 FINDINGS: Stable cardiomediastinal contours with enlarged heart size. There are scattered bilateral bandlike opacities consistent with scarring or atelectasis. No new consolidation. No pneumothorax or significant pleural effusion. No acute finding in the visualized skeleton. IMPRESSION: Scattered bandlike opacities likely reflecting scarring or atelectasis. Electronically Signed   By: Audie Pinto M.D.   On: 09/24/2019 10:30   NM XOFIGO INJECTION  Result Date: 09/15/2019  Trudi Ida was injected intravenously in Nuclear Medicine under the supervision of the attending radiologist     Discharge Exam: Vitals:    09/28/19 0507 09/28/19 0824  BP: (!) 138/98   Pulse: (!) 101   Resp: 19   Temp: 97.9 F (36.6 C)   SpO2: 91% 92%   Vitals:   09/27/19 2056 09/28/19 0200 09/28/19 0507 09/28/19 0824  BP:   (!) 138/98   Pulse:   (!) 101   Resp:   19   Temp:   97.9 F (36.6 C)   TempSrc:   Oral   SpO2: 95% 94% 91% 92%  Weight:      Height:        General: Pt is alert, awake, not in acute distress, ptosis to left eye Cardiovascular: RRR, S1/S2 +, no rubs, no gallops Respiratory: CTA bilaterally, no wheezing, no rhonchi Abdominal: Soft, NT, ND, bowel sounds + Extremities: no edema, no cyanosis    The results of significant diagnostics from this hospitalization (including imaging, microbiology, ancillary and laboratory) are listed below for reference.     Microbiology: Recent Results (from the past 240 hour(s))  Culture, blood (routine x 2)     Status: None (Preliminary result)   Collection Time: 09/24/19  9:56 AM   Specimen: BLOOD LEFT WRIST  Result Value Ref Range Status   Specimen Description   Final    BLOOD LEFT WRIST BOTTLES DRAWN AEROBIC AND ANAEROBIC   Special Requests Blood Culture adequate volume  Final   Culture   Final  NO GROWTH 4 DAYS Performed at [facility], [address] [place], [place] [address]    Report Status PENDING  Incomplete  Respiratory Panel by RT PCR (Flu A&B, Covid) - Nasopharyngeal Swab     Status: None   Collection Time: 09/24/19  9:58 AM   Specimen: Nasopharyngeal Swab  Result Value Ref Range Status   SARS Coronavirus 2 by RT PCR NEGATIVE NEGATIVE Final    Comment: (NOTE) SARS-CoV-2 target nucleic acids are NOT DETECTED. The SARS-CoV-2 RNA is generally detectable in upper respiratoy specimens during the acute phase of infection. The lowest concentration of SARS-CoV-2 viral copies this assay can detect is 131 copies/mL. A negative result does not preclude SARS-Cov-2 infection and should not be used as the sole basis for treatment or other patient  management decisions. A negative result may occur with  improper specimen collection/handling, submission of specimen other than nasopharyngeal swab, presence of viral mutation(s) within the areas targeted by this assay, and inadequate number of viral copies (<131 copies/mL). A negative result must be combined with clinical observations, patient history, and epidemiological information. The expected result is Negative. Fact Sheet for Patients:  PinkCheek.be Fact Sheet for Healthcare Providers:  GravelBags.it This test is not yet ap proved or cleared by the [place] FDA and  has been authorized for detection and/or diagnosis of SARS-CoV-2 by FDA under an Emergency Use Authorization (EUA). This EUA will remain  in effect (meaning this test can be used) for the duration of the COVID-19 declaration under Section 564(b)(1) of the Act, 21 U.S.C. section 360bbb-3(b)(1), unless the authorization is terminated or revoked sooner.    Influenza A by PCR NEGATIVE NEGATIVE Final   Influenza B by PCR NEGATIVE NEGATIVE Final    Comment: (NOTE) The Xpert Xpress SARS-CoV-2/FLU/RSV assay is intended as an aid in  the diagnosis of influenza from Nasopharyngeal swab specimens and  should not be used as a sole basis for treatment. Nasal washings and  aspirates are unacceptable for Xpert Xpress SARS-CoV-2/FLU/RSV  testing. Fact Sheet for Patients: PinkCheek.be Fact Sheet for Healthcare Providers: GravelBags.it This test is not yet approved or cleared by the [place] FDA and  has been authorized for detection and/or diagnosis of SARS-CoV-2 by  FDA under an Emergency Use Authorization (EUA). This EUA will remain  in effect (meaning this test can be used) for the duration of the  Covid-19 declaration under Section 564(b)(1) of the Act, 21  U.S.C. section 360bbb-3(b)(1), unless the  authorization is  terminated or revoked. Performed at [facility], [address], [place], [place] [address]   Culture, blood (routine x 2)     Status: None (Preliminary result)   Collection Time: 09/24/19 10:11 AM   Specimen: BLOOD LEFT HAND  Result Value Ref Range Status   Specimen Description   Final    BLOOD LEFT HAND BOTTLES DRAWN AEROBIC AND ANAEROBIC   Special Requests Blood Culture adequate volume  Final   Culture   Final    NO GROWTH 4 DAYS Performed at [facility], [address] [place], [place] [address]    Report Status PENDING  Incomplete     Labs: BNP (last 3 results) No results for input(s): BNP in the last 8760 hours. Basic Metabolic Panel: Recent Labs  Lab 09/24/19 0951 09/25/19 0520 09/26/19 0549 09/27/19 0508 09/28/19 0452  NA 134*  --  134* 138 132*  K 4.7  --  4.3 4.2 4.8  CL 102  --  100 101 100  CO2 23  --  23 25 21*  GLUCOSE 165*  --  259* 170* 213*  BUN 17  --  33* 32* 29*  CREATININE 1.09  --  1.16 1.17 1.14  CALCIUM 9.2  --  9.1 9.5 8.9  MG  --  2.3 2.3 2.3  --    Liver Function Tests: Recent Labs  Lab 09/27/19 0508 09/28/19 0452  AST 32 29  ALT 34 32  ALKPHOS 233* 209*  BILITOT 0.3 0.8  PROT 8.0 7.9  ALBUMIN 2.7* 2.7*   No results for input(s): LIPASE, AMYLASE in the last 168 hours. No results for input(s): AMMONIA in the last 168 hours. CBC: Recent Labs  Lab 09/24/19 0951 09/25/19 0520 09/26/19 0549 09/27/19 0508 09/28/19 0452  WBC 9.4 11.1* 13.7* 10.1 11.3*  NEUTROABS  --  10.0* 12.6* 8.2* 9.1*  HGB 8.6* 8.7* 8.6* 9.7* 9.5*  HCT 27.0* 27.1* 26.8* 30.4* 30.0*  MCV 78.3* 77.9* 77.9* 78.1* 77.7*  PLT 290 324 327 327 302   Cardiac Enzymes: No results for input(s): CKTOTAL, CKMB, CKMBINDEX, TROPONINI in the last 168 hours. BNP: Invalid input(s): POCBNP CBG: Recent Labs  Lab 09/27/19 1112 09/27/19 1615 09/27/19 2035 09/28/19 0320 09/28/19 0728  GLUCAP 203* 262* 174* 211* 208*   D-Dimer No results for  input(s): DDIMER in the last 72 hours. Hgb A1c No results for input(s): HGBA1C in the last 72 hours. Lipid Profile No results for input(s): CHOL, HDL, LDLCALC, TRIG, CHOLHDL, LDLDIRECT in the last 72 hours. Thyroid function studies No results for input(s): TSH, T4TOTAL, T3FREE, THYROIDAB in the last 72 hours.  Invalid input(s): FREET3 Anemia work up No results for input(s): VITAMINB12, FOLATE, FERRITIN, TIBC, IRON, RETICCTPCT in the last 72 hours. Urinalysis    Component Value Date/Time   COLORURINE YELLOW 05/18/2019 0818   APPEARANCEUR HAZY (A) 05/18/2019 0818   LABSPEC 1.016 05/18/2019 0818   PHURINE 5.0 05/18/2019 0818   GLUCOSEU NEGATIVE 05/18/2019 0818   HGBUR MODERATE (A) 05/18/2019 0818   HGBUR negative 12/25/2008 1206   BILIRUBINUR NEGATIVE 05/18/2019 0818   BILIRUBINUR negative 06/19/2016 1000   KETONESUR NEGATIVE 05/18/2019 0818   PROTEINUR 30 (A) 05/18/2019 0818   UROBILINOGEN 0.2 06/19/2016 1000   UROBILINOGEN 1.0 11/19/2014 1650   NITRITE POSITIVE (A) 05/18/2019 0818   LEUKOCYTESUR SMALL (A) 05/18/2019 0818   Sepsis Labs Invalid input(s): PROCALCITONIN,  WBC,  LACTICIDVEN Microbiology Recent Results (from the past 240 hour(s))  Culture, blood (routine x 2)     Status: None (Preliminary result)   Collection Time: 09/24/19  9:56 AM   Specimen: BLOOD LEFT WRIST  Result Value Ref Range Status   Specimen Description   Final    BLOOD LEFT WRIST BOTTLES DRAWN AEROBIC AND ANAEROBIC   Special Requests Blood Culture adequate volume  Final   Culture   Final    NO GROWTH 4 DAYS Performed at [facility], [address], [place], [place] [address]    Report Status PENDING  Incomplete  Respiratory Panel by RT PCR (Flu A&B, Covid) - Nasopharyngeal Swab     Status: None   Collection Time: 09/24/19  9:58 AM   Specimen: Nasopharyngeal Swab  Result Value Ref Range Status   SARS Coronavirus 2 by RT PCR NEGATIVE NEGATIVE Final    Comment: (NOTE) SARS-CoV-2 target nucleic  acids are NOT DETECTED. The SARS-CoV-2 RNA is generally detectable in upper respiratoy specimens during the acute phase of infection. The lowest concentration of SARS-CoV-2 viral copies this assay can  detect is 131 copies/mL. A negative result does not preclude SARS-Cov-2 infection and should not be used as the sole basis for treatment or other patient management decisions. A negative result may occur with  improper specimen collection/handling, submission of specimen other than nasopharyngeal swab, presence of viral mutation(s) within the areas targeted by this assay, and inadequate number of viral copies (<131 copies/mL). A negative result must be combined with clinical observations, patient history, and epidemiological information. The expected result is Negative. Fact Sheet for Patients:  PinkCheek.be Fact Sheet for Healthcare Providers:  GravelBags.it This test is not yet ap proved or cleared by the [place] FDA and  has been authorized for detection and/or diagnosis of SARS-CoV-2 by FDA under an Emergency Use Authorization (EUA). This EUA will remain  in effect (meaning this test can be used) for the duration of the COVID-19 declaration under Section 564(b)(1) of the Act, 21 U.S.C. section 360bbb-3(b)(1), unless the authorization is terminated or revoked sooner.    Influenza A by PCR NEGATIVE NEGATIVE Final   Influenza B by PCR NEGATIVE NEGATIVE Final    Comment: (NOTE) The Xpert Xpress SARS-CoV-2/FLU/RSV assay is intended as an aid in  the diagnosis of influenza from Nasopharyngeal swab specimens and  should not be used as a sole basis for treatment. Nasal washings and  aspirates are unacceptable for Xpert Xpress SARS-CoV-2/FLU/RSV  testing. Fact Sheet for Patients: PinkCheek.be Fact Sheet for Healthcare Providers: GravelBags.it This test is not yet  approved or cleared by the [place] FDA and  has been authorized for detection and/or diagnosis of SARS-CoV-2 by  FDA under an Emergency Use Authorization (EUA). This EUA will remain  in effect (meaning this test can be used) for the duration of the  Covid-19 declaration under Section 564(b)(1) of the Act, 21  U.S.C. section 360bbb-3(b)(1), unless the authorization is  terminated or revoked. Performed at [facility], [address] [place], [place] [address]   Culture, blood (routine x 2)     Status: None (Preliminary result)   Collection Time: 09/24/19 10:11 AM   Specimen: BLOOD LEFT HAND  Result Value Ref Range Status   Specimen Description   Final    BLOOD LEFT HAND BOTTLES DRAWN AEROBIC AND ANAEROBIC   Special Requests Blood Culture adequate volume  Final   Culture   Final    NO GROWTH 4 DAYS Performed at [facility], [address], [place], [place] [address]    Report Status PENDING  Incomplete     Time coordinating discharge: 35 minutes  SIGNED:   Rodena Goldmann, DO [facility] 09/28/2019, 8:46 AM  If 7PM-7AM, please contact night-coverage [email or web address]

## 2019-09-29 ENCOUNTER — Other Ambulatory Visit: Payer: Self-pay | Admitting: *Deleted

## 2019-09-29 ENCOUNTER — Other Ambulatory Visit: Payer: Self-pay | Admitting: Internal Medicine

## 2019-09-29 LAB — CULTURE, BLOOD (ROUTINE X 2)
Culture: NO GROWTH
Culture: NO GROWTH
Special Requests: ADEQUATE
Special Requests: ADEQUATE

## 2019-09-29 NOTE — Telephone Encounter (Signed)
Pls call the pharmacy back [phone]

## 2019-09-29 NOTE — Telephone Encounter (Signed)
New encounter started

## 2019-09-30 MED ORDER — TAMSULOSIN HCL 0.4 MG PO CAPS: 0.4000 mg | ORAL_CAPSULE | Freq: Every day | ORAL | 1 refills | Status: AC

## 2019-10-06 ENCOUNTER — Telehealth: Payer: Self-pay | Admitting: Medical Oncology

## 2019-10-06 ENCOUNTER — Ambulatory Visit: Payer: 59

## 2019-10-06 NOTE — Telephone Encounter (Signed)
Left message with patient to let him know we are thinking about him and hoping he is having a good day. He had asked if should finish his Xofigo injections. Per Dr. Tammi Klippel he does not recommend we continue. I asked him to call me back with an update. I will follow up if I do not hear back from him.

## 2019-10-16 ENCOUNTER — Other Ambulatory Visit: Payer: Self-pay

## 2019-10-16 DIAGNOSIS — C61 Malignant neoplasm of prostate: Secondary | ICD-10-CM

## 2019-10-17 ENCOUNTER — Inpatient Hospital Stay: Payer: Medicaid Other | Admitting: Oncology

## 2019-10-17 ENCOUNTER — Inpatient Hospital Stay: Payer: Medicaid Other

## 2019-10-22 IMAGING — DX PORTABLE CHEST - 1 VIEW
1 series · 1 of 1 positions shown · non-contrast
Comparison: Radiograph January 10, 2019.

CLINICAL DATA: Endotracheal tube placement.

EXAM:
PORTABLE CHEST 1 VIEW

[chest ap]
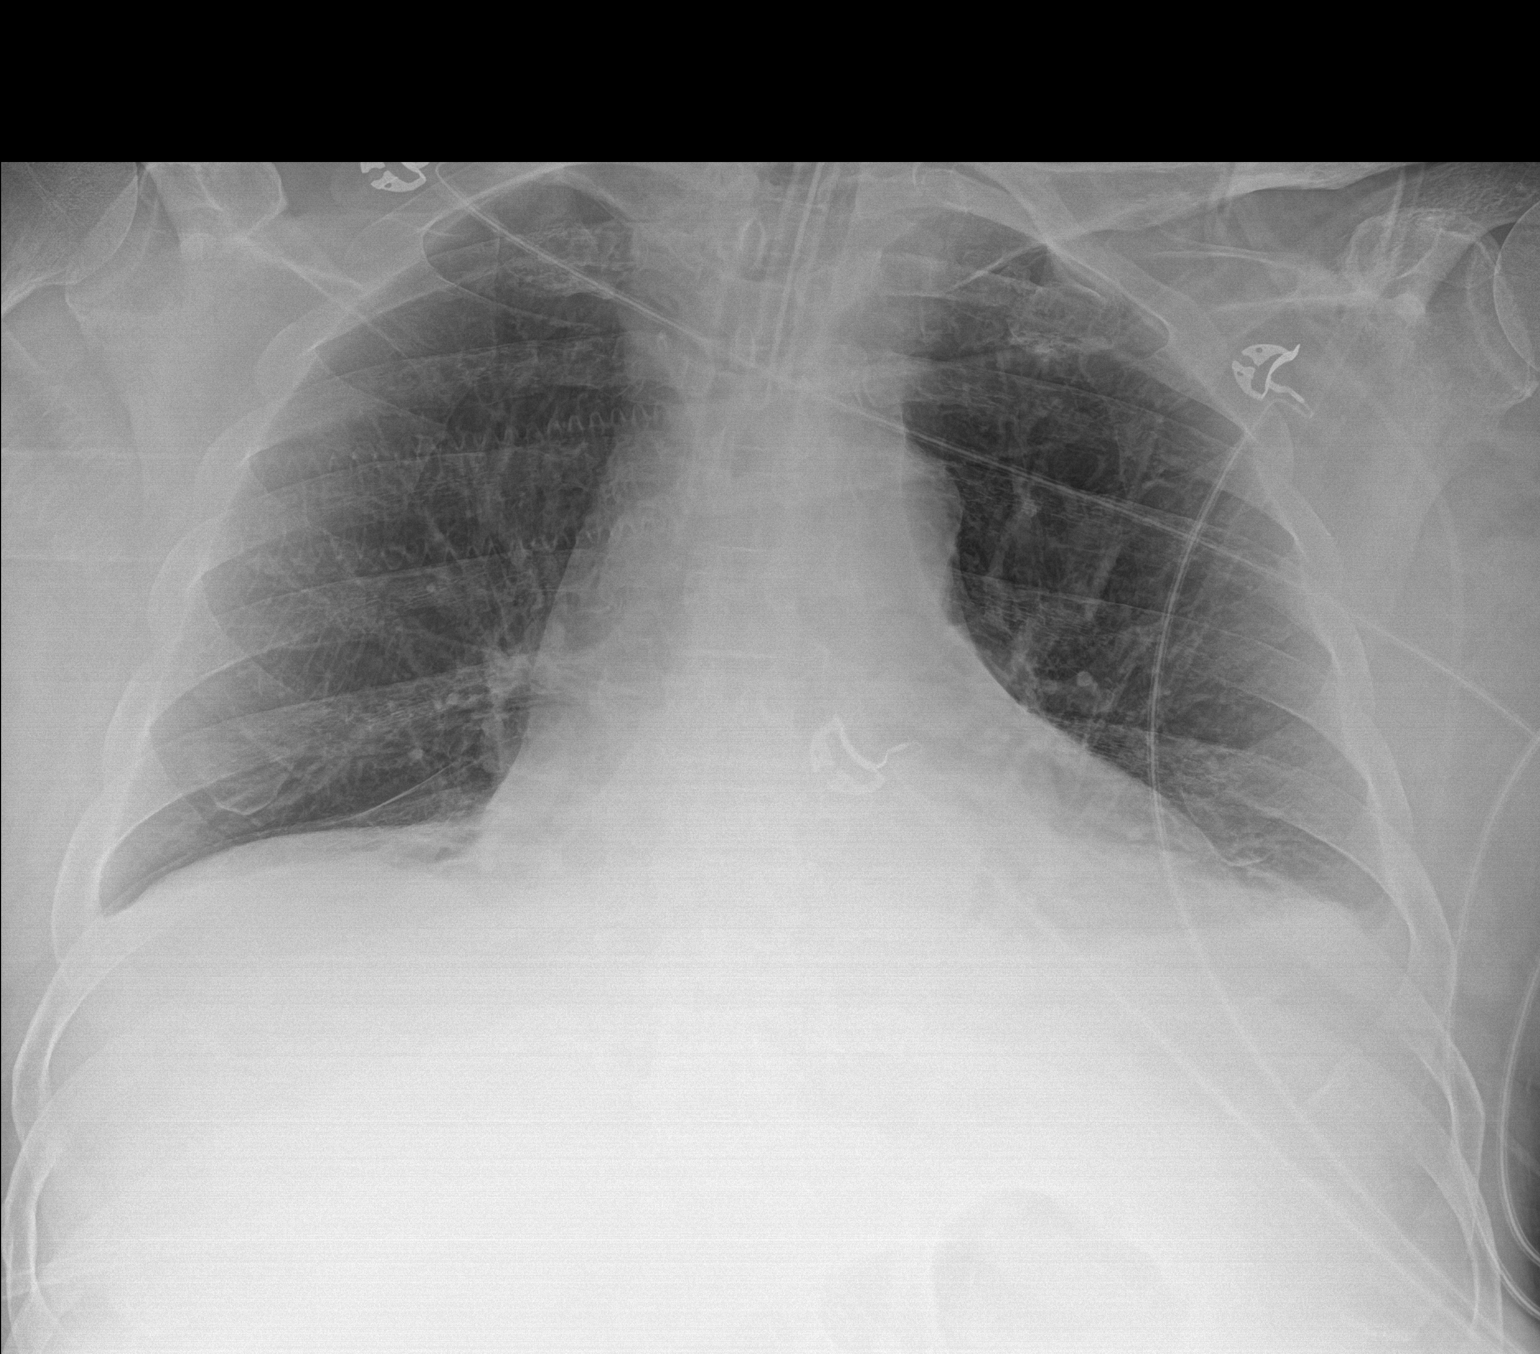

[1 of 1 positions shown; findings below may reference images not displayed]

FINDINGS: Stable cardiomediastinal silhouette. Endotracheal tube is in grossly
good position. No pneumothorax is noted. Right lung is clear. Mild
left basilar atelectasis is noted with minimal left pleural
effusion. Bony thorax is unremarkable.
IMPRESSION: Endotracheal tube in good position. Mild left basilar atelectasis is
noted with minimal left pleural effusion.

## 2019-10-23 IMAGING — DX PORTABLE CHEST - 1 VIEW
1 series · 1 of 1 positions shown · non-contrast
Comparison: Radiograph January 11, 2019.

CLINICAL DATA: Acute respiratory failure.

EXAM:
PORTABLE CHEST 1 VIEW

[chest ap]
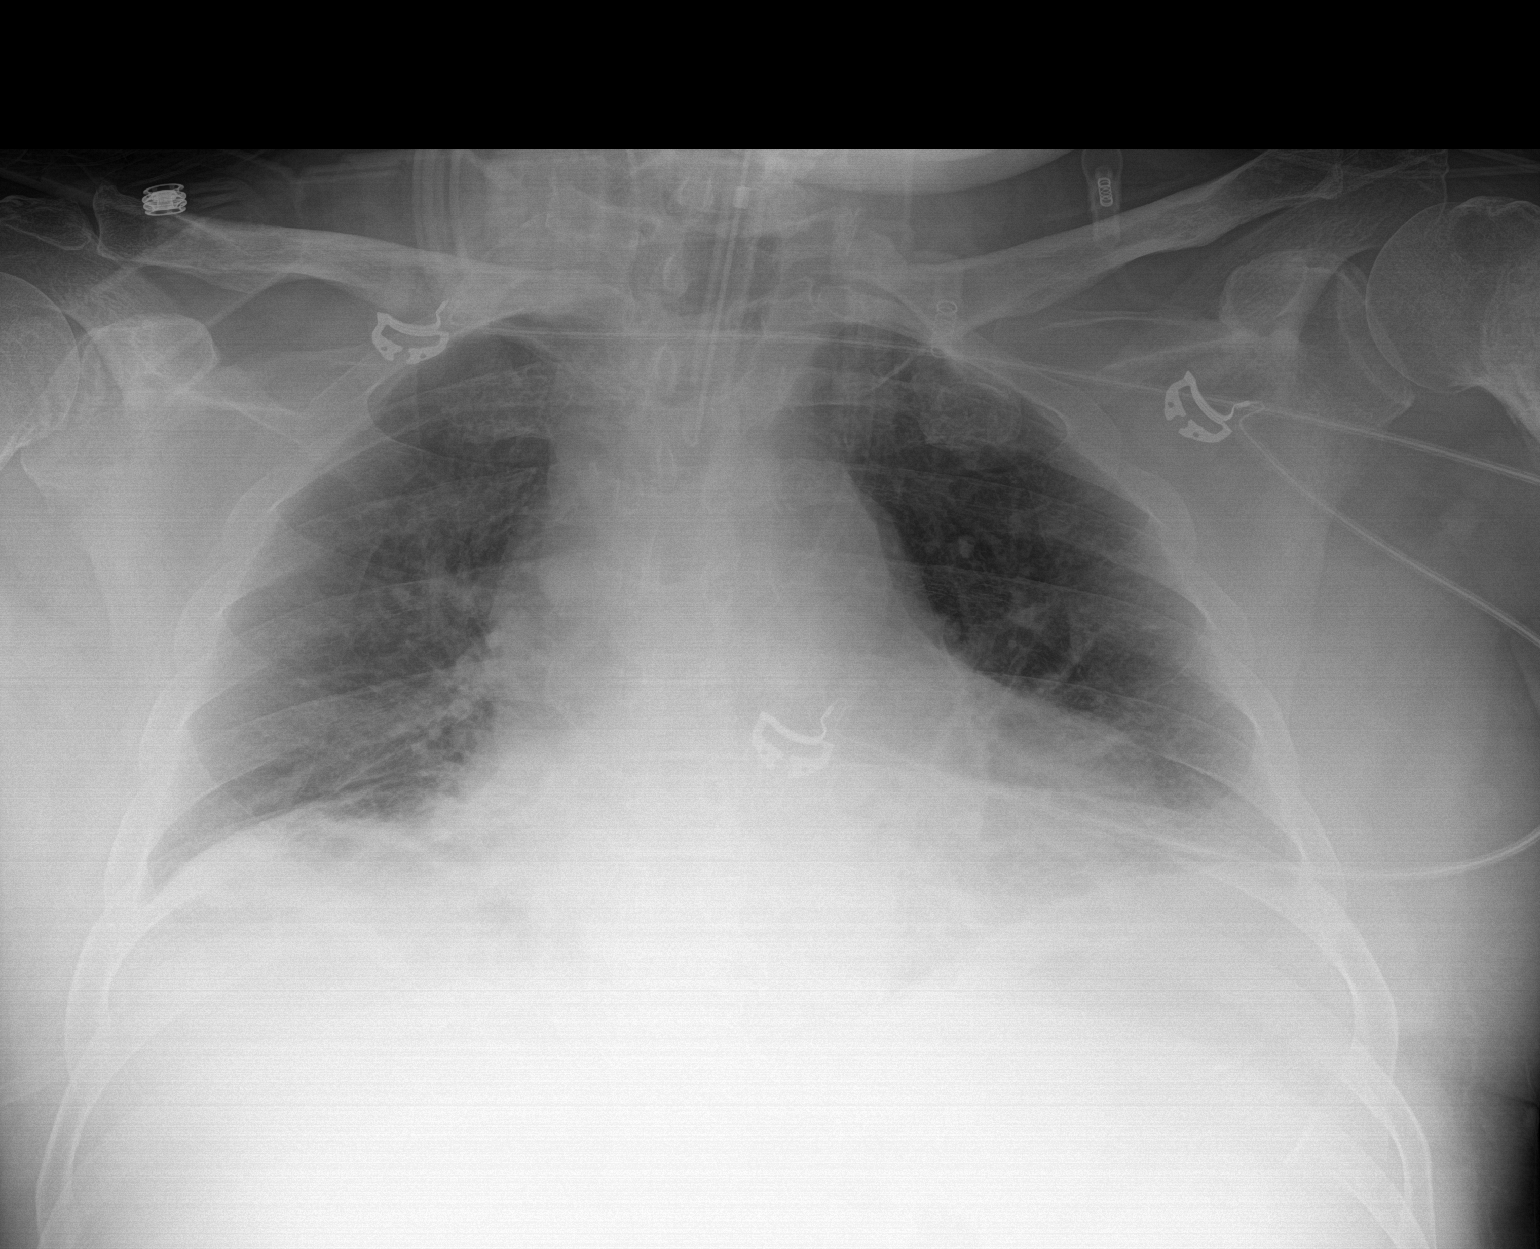

[1 of 1 positions shown; findings below may reference images not displayed]

FINDINGS: Stable cardiomediastinal silhouette. Endotracheal tube is in grossly
good position. Hypoinflation of the lungs is noted with mild
bibasilar subsegmental atelectasis. No pneumothorax or significant
pleural effusion is noted. Bony thorax unremarkable.
IMPRESSION: Hypoinflation of the lungs is noted with mild bibasilar subsegmental
atelectasis. Stable support apparatus.

## 2019-10-27 ENCOUNTER — Other Ambulatory Visit: Payer: Self-pay

## 2019-10-27 DIAGNOSIS — M5416 Radiculopathy, lumbar region: Secondary | ICD-10-CM

## 2019-10-27 NOTE — Telephone Encounter (Signed)
Received TC from [place] at [facility], [place] who states they have sent several refill request via [place] with no response.  Chart review shows no refill request.  Pt requesting refills on : Gabapentin Azelastine nasal spray diclofanac gel (1%) fallen off med list  Will forward to PCP. SChaplin, RN,BSN

## 2019-10-28 MED ORDER — GABAPENTIN 300 MG PO CAPS: 600.0000 mg | ORAL_CAPSULE | Freq: Two times a day (BID) | ORAL | 2 refills | Status: AC

## 2019-10-28 MED ORDER — AZELASTINE HCL 0.1 % NA SOLN: NASAL | 1 refills | Status: AC

## 2019-11-01 NOTE — Progress Notes (Signed)
  Radiation Oncology         [phone] ________________________________  Name: Jacob Joseph MRN: [id number]  Date: 09/15/2019  DOB: 11-11-1955  Radium-223 Infusion Note  Diagnosis:  Castration resistant prostate cancer with painful bone involvement  Current Infusion:    3  Planned Infusions:  6  Narrative: [name] presented to nuclear medicine for treatment. His most recent blood counts were reviewed.  He remains a good candidate to proceed with Ra-223.  The patient was situated in an infusion suite with a contact barrier placed under his arm. Intravenous access was established, using sterile technique, and a normal saline infusion from a syringe was started.  Micro-dosimetry:  The prescribed radiation activity was assayed and confirmed to be within specified tolerance.  Special Treatment Procedure - Infusion:  The nuclear medicine technologist and I personally verified the dose activity to be delivered as specified in the written directive, and verified the patient identification via 2 separate methods.  The syringe containing the dose was attached to an intravenous access and the dose delivered over a minute. No complications were noted.  The total administered dose was 140.6 microcuries.   A saline flush of the line and the syringe that contained the isotope was then performed.  The residual radioactivity in the syringe was 3.2 microcuries, so the actual infused isotope activity was 137.4 microcuries.   Pressure was applied to the venipuncture site, and a compression bandage placed.   Radiation Safety personnel were present to perform the discharge survey, as detailed on their documentation.   After a short period of observation, the patient had his IV removed.  Impression:  The patient tolerated his infusion relatively well.  Plan:  The patient will return in one month for ongoing care.    ________________________________  Sheral Apley. Tammi Klippel, M.D.

## 2019-11-01 NOTE — Addendum Note (Signed)
Encounter addended by: Tyler Pita, MD on: 11/01/2019 10:46 AM  Actions taken: Medication List reviewed, Problem List reviewed, Allergies reviewed, Clinical Note Signed

## 2019-11-13 ENCOUNTER — Other Ambulatory Visit: Payer: Self-pay

## 2019-11-13 ENCOUNTER — Other Ambulatory Visit: Payer: 59

## 2019-11-13 DIAGNOSIS — Z79899 Other long term (current) drug therapy: Secondary | ICD-10-CM

## 2019-11-13 DIAGNOSIS — Z113 Encounter for screening for infections with a predominantly sexual mode of transmission: Secondary | ICD-10-CM

## 2019-11-13 DIAGNOSIS — B20 Human immunodeficiency virus [HIV] disease: Secondary | ICD-10-CM

## 2019-11-17 ENCOUNTER — Encounter: Payer: Self-pay | Admitting: *Deleted

## 2019-11-18 DEATH — deceased

## 2019-11-27 ENCOUNTER — Ambulatory Visit: Payer: 59

## 2019-11-30 ENCOUNTER — Encounter: Payer: 59 | Admitting: Internal Medicine

## 2019-12-22 ENCOUNTER — Ambulatory Visit: Payer: 59 | Admitting: Podiatry

## 2020-04-04 ENCOUNTER — Encounter (HOSPITAL_COMMUNITY): Payer: Self-pay

## 2020-04-04 NOTE — Therapy (Signed)
[place], [place], [address] Phone: [phone]   Fax:  [phone]  Patient Details  Name: Jacob Joseph MRN: [id number] Date of Birth: 12/31/1955 Referring Provider:  No ref. provider found  Encounter Date: 04/04/2020   PHYSICAL THERAPY DISCHARGE SUMMARY  Visits from Start of Care: 11  Current functional level related to goals / functional outcomes: See note from 08/25/19   Remaining deficits: See note from 08/25/19   Education / Equipment: See note from 08/25/19  Plan: Patient agrees to discharge.  Patient goals were partially met. Patient is being discharged due to not returning since the last visit.  ?????        Talbot Grumbling PT, DPT 04/04/20, 2:41 PM [place], [place], [address] Phone: [phone]   Fax:  [phone]

## 2021-08-19 ENCOUNTER — Telehealth: Payer: Self-pay

## 2021-08-19 NOTE — Telephone Encounter (Signed)
Patient last seen 02/2019 - called to offer appointment. No answer and unable to leave voicemail.   Beryle Flock, RN

## 2021-10-19 ENCOUNTER — Encounter: Payer: Self-pay | Admitting: Oncology

## 2021-11-03 ENCOUNTER — Telehealth: Payer: Self-pay

## 2021-11-03 NOTE — Telephone Encounter (Signed)
Called patient to offer appointment, no answer and unable to leave message.  ? ?Beryle Flock, RN ? ?

## 2021-12-20 ENCOUNTER — Other Ambulatory Visit: Payer: Self-pay | Admitting: Nurse Practitioner

## 2023-04-28 ENCOUNTER — Encounter: Payer: Self-pay | Admitting: Gastroenterology

## 2023-05-28 ENCOUNTER — Encounter: Payer: Self-pay | Admitting: Gastroenterology
